# Patient Record
Sex: Female | Born: 1960 | Race: Black or African American | Hispanic: No | Marital: Single | Smoking: Former smoker
Health system: Southern US, Community
[De-identification: ages and names within clinical notes are randomized; demographics above are authoritative.]

## PROBLEM LIST (undated history)

## (undated) ENCOUNTER — Emergency Department (HOSPITAL_COMMUNITY): Discharge status: Absent without leave | Payer: Medicare Other

## (undated) DIAGNOSIS — I639 Cerebral infarction, unspecified: Secondary | ICD-10-CM

## (undated) DIAGNOSIS — I82409 Acute embolism and thrombosis of unspecified deep veins of unspecified lower extremity: Secondary | ICD-10-CM

## (undated) DIAGNOSIS — N390 Urinary tract infection, site not specified: Secondary | ICD-10-CM

## (undated) DIAGNOSIS — G629 Polyneuropathy, unspecified: Secondary | ICD-10-CM

## (undated) DIAGNOSIS — N319 Neuromuscular dysfunction of bladder, unspecified: Secondary | ICD-10-CM

## (undated) DIAGNOSIS — Z87442 Personal history of urinary calculi: Secondary | ICD-10-CM

## (undated) DIAGNOSIS — D649 Anemia, unspecified: Secondary | ICD-10-CM

## (undated) DIAGNOSIS — E440 Moderate protein-calorie malnutrition: Secondary | ICD-10-CM

## (undated) DIAGNOSIS — M6281 Muscle weakness (generalized): Secondary | ICD-10-CM

## (undated) DIAGNOSIS — I10 Essential (primary) hypertension: Secondary | ICD-10-CM

## (undated) DIAGNOSIS — N200 Calculus of kidney: Secondary | ICD-10-CM

## (undated) DIAGNOSIS — N730 Acute parametritis and pelvic cellulitis: Secondary | ICD-10-CM

## (undated) DIAGNOSIS — E119 Type 2 diabetes mellitus without complications: Secondary | ICD-10-CM

## (undated) DIAGNOSIS — E46 Unspecified protein-calorie malnutrition: Secondary | ICD-10-CM

## (undated) DIAGNOSIS — K219 Gastro-esophageal reflux disease without esophagitis: Secondary | ICD-10-CM

## (undated) DIAGNOSIS — N183 Chronic kidney disease, stage 3 (moderate): Secondary | ICD-10-CM

## (undated) DIAGNOSIS — G35 Multiple sclerosis: Secondary | ICD-10-CM

## (undated) DIAGNOSIS — M24541 Contracture, right hand: Secondary | ICD-10-CM

## (undated) DIAGNOSIS — Z960 Presence of urogenital implants: Secondary | ICD-10-CM

## (undated) DIAGNOSIS — E785 Hyperlipidemia, unspecified: Secondary | ICD-10-CM

## (undated) DIAGNOSIS — E876 Hypokalemia: Secondary | ICD-10-CM

## (undated) DIAGNOSIS — I890 Lymphedema, not elsewhere classified: Secondary | ICD-10-CM

## (undated) DIAGNOSIS — G709 Myoneural disorder, unspecified: Secondary | ICD-10-CM

## (undated) DIAGNOSIS — Z8619 Personal history of other infectious and parasitic diseases: Secondary | ICD-10-CM

## (undated) DIAGNOSIS — L8994 Pressure ulcer of unspecified site, stage 4: Secondary | ICD-10-CM

## (undated) DIAGNOSIS — D72829 Elevated white blood cell count, unspecified: Secondary | ICD-10-CM

## (undated) DIAGNOSIS — N179 Acute kidney failure, unspecified: Secondary | ICD-10-CM

## (undated) DIAGNOSIS — E1140 Type 2 diabetes mellitus with diabetic neuropathy, unspecified: Secondary | ICD-10-CM

## (undated) HISTORY — PX: HERNIA MESH REMOVAL: SHX1745

## (undated) HISTORY — DX: Moderate protein-calorie malnutrition: E44.0

## (undated) HISTORY — DX: Pressure ulcer of unspecified site, stage 4: L89.94

## (undated) HISTORY — PX: OTHER SURGICAL HISTORY: SHX169

## (undated) HISTORY — PX: TUBAL LIGATION: SHX77

## (undated) HISTORY — DX: Acute kidney failure, unspecified: N17.9

## (undated) HISTORY — DX: Multiple sclerosis: G35

## (undated) HISTORY — DX: Chronic kidney disease, stage 3 (moderate): N18.3

## (undated) HISTORY — PX: NEPHROSTOMY TUBE PLACEMENT (ARMC HX): HXRAD1726

---

## 1998-01-08 ENCOUNTER — Ambulatory Visit (HOSPITAL_COMMUNITY): Admission: RE | Admit: 1998-01-08 | Discharge: 1998-01-08 | Payer: Self-pay | Admitting: Unknown Physician Specialty

## 1998-01-27 ENCOUNTER — Emergency Department (HOSPITAL_COMMUNITY): Admission: EM | Admit: 1998-01-27 | Discharge: 1998-01-27 | Payer: Self-pay | Admitting: Emergency Medicine

## 1998-08-04 ENCOUNTER — Emergency Department (HOSPITAL_COMMUNITY): Admission: EM | Admit: 1998-08-04 | Discharge: 1998-08-04 | Payer: Self-pay | Admitting: Emergency Medicine

## 1999-04-09 ENCOUNTER — Encounter: Payer: Self-pay | Admitting: Emergency Medicine

## 1999-04-09 ENCOUNTER — Emergency Department (HOSPITAL_COMMUNITY): Admission: EM | Admit: 1999-04-09 | Discharge: 1999-04-09 | Payer: Self-pay | Admitting: Emergency Medicine

## 1999-04-26 ENCOUNTER — Emergency Department (HOSPITAL_COMMUNITY): Admission: EM | Admit: 1999-04-26 | Discharge: 1999-04-26 | Payer: Self-pay | Admitting: Emergency Medicine

## 1999-04-26 ENCOUNTER — Encounter: Payer: Self-pay | Admitting: Emergency Medicine

## 1999-06-11 ENCOUNTER — Encounter: Admission: RE | Admit: 1999-06-11 | Discharge: 1999-06-11 | Payer: Self-pay | Admitting: Internal Medicine

## 1999-07-01 ENCOUNTER — Encounter: Admission: RE | Admit: 1999-07-01 | Discharge: 1999-07-01 | Payer: Self-pay | Admitting: Hematology and Oncology

## 1999-07-14 ENCOUNTER — Ambulatory Visit (HOSPITAL_COMMUNITY): Admission: RE | Admit: 1999-07-14 | Discharge: 1999-07-14 | Payer: Self-pay

## 1999-10-10 ENCOUNTER — Ambulatory Visit (HOSPITAL_COMMUNITY): Admission: RE | Admit: 1999-10-10 | Discharge: 1999-10-10 | Payer: Self-pay | Admitting: Psychiatry

## 2000-10-27 ENCOUNTER — Ambulatory Visit (HOSPITAL_COMMUNITY): Admission: RE | Admit: 2000-10-27 | Discharge: 2000-10-27 | Payer: Self-pay | Admitting: *Deleted

## 2000-10-27 ENCOUNTER — Encounter: Payer: Self-pay | Admitting: *Deleted

## 2000-11-09 ENCOUNTER — Observation Stay (HOSPITAL_COMMUNITY): Admission: RE | Admit: 2000-11-09 | Discharge: 2000-11-10 | Payer: Self-pay | Admitting: *Deleted

## 2000-11-09 ENCOUNTER — Encounter (INDEPENDENT_AMBULATORY_CARE_PROVIDER_SITE_OTHER): Payer: Self-pay

## 2001-02-14 ENCOUNTER — Encounter: Admission: RE | Admit: 2001-02-14 | Discharge: 2001-05-15 | Payer: Self-pay | Admitting: *Deleted

## 2001-04-02 ENCOUNTER — Emergency Department (HOSPITAL_COMMUNITY): Admission: EM | Admit: 2001-04-02 | Discharge: 2001-04-02 | Payer: Self-pay | Admitting: Emergency Medicine

## 2001-04-05 ENCOUNTER — Ambulatory Visit (HOSPITAL_COMMUNITY): Admission: RE | Admit: 2001-04-05 | Discharge: 2001-04-05 | Payer: Self-pay | Admitting: Internal Medicine

## 2001-04-05 ENCOUNTER — Encounter: Admission: RE | Admit: 2001-04-05 | Discharge: 2001-04-05 | Payer: Self-pay

## 2001-04-05 ENCOUNTER — Encounter: Payer: Self-pay | Admitting: Internal Medicine

## 2001-05-10 ENCOUNTER — Emergency Department (HOSPITAL_COMMUNITY): Admission: EM | Admit: 2001-05-10 | Discharge: 2001-05-10 | Payer: Self-pay

## 2001-05-18 ENCOUNTER — Emergency Department (HOSPITAL_COMMUNITY): Admission: EM | Admit: 2001-05-18 | Discharge: 2001-05-18 | Payer: Self-pay | Admitting: Emergency Medicine

## 2001-06-20 ENCOUNTER — Encounter: Admission: RE | Admit: 2001-06-20 | Discharge: 2001-06-20 | Payer: Self-pay

## 2001-07-18 ENCOUNTER — Encounter: Payer: Self-pay | Admitting: General Surgery

## 2001-07-19 ENCOUNTER — Encounter (INDEPENDENT_AMBULATORY_CARE_PROVIDER_SITE_OTHER): Payer: Self-pay | Admitting: *Deleted

## 2001-07-19 ENCOUNTER — Inpatient Hospital Stay (HOSPITAL_COMMUNITY): Admission: RE | Admit: 2001-07-19 | Discharge: 2001-07-22 | Payer: Self-pay | Admitting: General Surgery

## 2001-10-09 ENCOUNTER — Ambulatory Visit (HOSPITAL_COMMUNITY): Admission: RE | Admit: 2001-10-09 | Discharge: 2001-10-09 | Payer: Self-pay

## 2003-07-07 ENCOUNTER — Encounter: Admission: RE | Admit: 2003-07-07 | Discharge: 2003-10-05 | Payer: Self-pay | Admitting: Internal Medicine

## 2004-04-09 ENCOUNTER — Other Ambulatory Visit: Admission: RE | Admit: 2004-04-09 | Discharge: 2004-04-09 | Payer: Self-pay | Admitting: Internal Medicine

## 2005-02-15 ENCOUNTER — Other Ambulatory Visit: Admission: RE | Admit: 2005-02-15 | Discharge: 2005-02-15 | Payer: Self-pay | Admitting: Internal Medicine

## 2006-05-23 ENCOUNTER — Encounter: Admission: RE | Admit: 2006-05-23 | Discharge: 2006-05-23 | Payer: Self-pay | Admitting: Internal Medicine

## 2006-12-06 ENCOUNTER — Other Ambulatory Visit: Admission: RE | Admit: 2006-12-06 | Discharge: 2006-12-06 | Payer: Self-pay | Admitting: Obstetrics and Gynecology

## 2007-12-04 ENCOUNTER — Encounter: Admission: RE | Admit: 2007-12-04 | Discharge: 2007-12-05 | Payer: Self-pay | Admitting: Internal Medicine

## 2010-01-06 ENCOUNTER — Encounter: Admission: RE | Admit: 2010-01-06 | Discharge: 2010-01-06 | Payer: Self-pay | Admitting: Internal Medicine

## 2010-01-18 ENCOUNTER — Other Ambulatory Visit: Payer: Self-pay | Admitting: Emergency Medicine

## 2010-01-18 ENCOUNTER — Ambulatory Visit: Payer: Self-pay | Admitting: Critical Care Medicine

## 2010-01-18 ENCOUNTER — Inpatient Hospital Stay (HOSPITAL_COMMUNITY): Admission: EM | Admit: 2010-01-18 | Discharge: 2010-01-26 | Payer: Self-pay | Admitting: Emergency Medicine

## 2010-01-19 ENCOUNTER — Other Ambulatory Visit: Payer: Self-pay | Admitting: Emergency Medicine

## 2010-01-22 ENCOUNTER — Other Ambulatory Visit: Payer: Self-pay | Admitting: Emergency Medicine

## 2010-01-25 ENCOUNTER — Other Ambulatory Visit: Payer: Self-pay | Admitting: Emergency Medicine

## 2010-07-04 ENCOUNTER — Encounter: Payer: Self-pay | Admitting: Internal Medicine

## 2010-07-05 ENCOUNTER — Encounter: Payer: Self-pay | Admitting: Internal Medicine

## 2010-08-26 LAB — GLUCOSE, CAPILLARY
Glucose-Capillary: 124 mg/dL — ABNORMAL HIGH (ref 70–99)
Glucose-Capillary: 124 mg/dL — ABNORMAL HIGH (ref 70–99)

## 2010-08-27 LAB — PROCALCITONIN
Procalcitonin: 36.22 ng/mL
Procalcitonin: 61.16 ng/mL
Procalcitonin: 95.34 ng/mL

## 2010-08-27 LAB — BASIC METABOLIC PANEL
BUN: 12 mg/dL (ref 6–23)
BUN: 13 mg/dL (ref 6–23)
BUN: 19 mg/dL (ref 6–23)
Calcium: 7 mg/dL — ABNORMAL LOW (ref 8.4–10.5)
Calcium: 8.5 mg/dL (ref 8.4–10.5)
Calcium: 8.9 mg/dL (ref 8.4–10.5)
Chloride: 114 mEq/L — ABNORMAL HIGH (ref 96–112)
Chloride: 117 mEq/L — ABNORMAL HIGH (ref 96–112)
Creatinine, Ser: 0.66 mg/dL (ref 0.4–1.2)
Creatinine, Ser: 1.08 mg/dL (ref 0.4–1.2)
Creatinine, Ser: 1.65 mg/dL — ABNORMAL HIGH (ref 0.4–1.2)
GFR calc Af Amer: 40 mL/min — ABNORMAL LOW (ref >60)
GFR calc non Af Amer: 53 mL/min — ABNORMAL LOW (ref >60)
GFR calc non Af Amer: 58 mL/min — ABNORMAL LOW (ref >60)
GFR calc non Af Amer: >60 mL/min (ref >60)
Potassium: 3.7 mEq/L (ref 3.5–5.1)
Potassium: 4.1 mEq/L (ref 3.5–5.1)

## 2010-08-27 LAB — CULTURE, BLOOD (ROUTINE X 2)

## 2010-08-27 LAB — CARDIAC PANEL(CRET KIN+CKTOT+MB+TROPI)
CK, MB: 6.2 ng/mL (ref 0.3–4.0)
Relative Index: 0.4 (ref 0.0–2.5)
Total CK: 1529 U/L — ABNORMAL HIGH (ref 7–177)
Troponin I: 0.11 ng/mL — ABNORMAL HIGH (ref 0.00–0.06)

## 2010-08-27 LAB — CBC
HCT: 34.2 % — ABNORMAL LOW (ref 36.0–46.0)
Hemoglobin: 13.5 g/dL (ref 12.0–15.0)
MCH: 31.4 pg (ref 26.0–34.0)
MCHC: 34.6 g/dL (ref 30.0–36.0)
MCHC: 35 g/dL (ref 30.0–36.0)
MCV: 88.5 fL (ref 78.0–100.0)
MCV: 89.8 fL (ref 78.0–100.0)
Platelets: 102 10*3/uL — ABNORMAL LOW (ref 150–400)
Platelets: 106 10*3/uL — ABNORMAL LOW (ref 150–400)
Platelets: 131 10*3/uL — ABNORMAL LOW (ref 150–400)
Platelets: 137 10*3/uL — ABNORMAL LOW (ref 150–400)
Platelets: 212 10*3/uL (ref 150–400)
RBC: 3.41 MIL/uL — ABNORMAL LOW (ref 3.87–5.11)
RDW: 15.4 % (ref 11.5–15.5)
RDW: 15.7 % — ABNORMAL HIGH (ref 11.5–15.5)
RDW: 15.8 % — ABNORMAL HIGH (ref 11.5–15.5)
RDW: 15.9 % — ABNORMAL HIGH (ref 11.5–15.5)
WBC: 10.9 10*3/uL — ABNORMAL HIGH (ref 4.0–10.5)
WBC: 13 10*3/uL — ABNORMAL HIGH (ref 4.0–10.5)
WBC: 14.5 10*3/uL — ABNORMAL HIGH (ref 4.0–10.5)
WBC: 22.3 10*3/uL — ABNORMAL HIGH (ref 4.0–10.5)
WBC: 29.5 10*3/uL — ABNORMAL HIGH (ref 4.0–10.5)

## 2010-08-27 LAB — TYPE AND SCREEN: Antibody Screen: NEGATIVE

## 2010-08-27 LAB — BLOOD GAS, ARTERIAL
Patient temperature: 97.9
pH, Arterial: 7.451 — ABNORMAL HIGH (ref 7.350–7.400)

## 2010-08-27 LAB — DIFFERENTIAL
Lymphocytes Relative: 2 % — ABNORMAL LOW (ref 12–46)
Lymphs Abs: 0.6 10*3/uL — ABNORMAL LOW (ref 0.7–4.0)
Neutro Abs: 27.7 10*3/uL — ABNORMAL HIGH (ref 1.7–7.7)

## 2010-08-27 LAB — COMPREHENSIVE METABOLIC PANEL
Albumin: 3.3 g/dL — ABNORMAL LOW (ref 3.5–5.2)
Alkaline Phosphatase: 74 U/L (ref 39–117)
Calcium: 8.8 mg/dL (ref 8.4–10.5)
Chloride: 105 mEq/L (ref 96–112)
Creatinine, Ser: 1.89 mg/dL — ABNORMAL HIGH (ref 0.4–1.2)
GFR calc Af Amer: 34 mL/min — ABNORMAL LOW (ref >60)
Potassium: 3.3 mEq/L — ABNORMAL LOW (ref 3.5–5.1)
Sodium: 137 mEq/L (ref 135–145)

## 2010-08-27 LAB — URINE CULTURE: Colony Count: >=100000

## 2010-08-27 LAB — POCT I-STAT 3, ART BLOOD GAS (G3+)
Acid-base deficit: 9 mmol/L — ABNORMAL HIGH (ref 0.0–2.0)
O2 Saturation: 96 %
Patient temperature: 98.6
pCO2 arterial: 24 mmHg — ABNORMAL LOW (ref 35.0–45.0)
pO2, Arterial: 80 mmHg (ref 80.0–100.0)

## 2010-08-27 LAB — CARBOXYHEMOGLOBIN
Carboxyhemoglobin: 1.2 % (ref 0.5–1.5)
Methemoglobin: 1.2 % (ref 0.0–1.5)
O2 Saturation: 74.6 %

## 2010-08-27 LAB — GLUCOSE, CAPILLARY
Glucose-Capillary: 110 mg/dL — ABNORMAL HIGH (ref 70–99)
Glucose-Capillary: 134 mg/dL — ABNORMAL HIGH (ref 70–99)
Glucose-Capillary: 134 mg/dL — ABNORMAL HIGH (ref 70–99)
Glucose-Capillary: 151 mg/dL — ABNORMAL HIGH (ref 70–99)

## 2010-08-27 LAB — MRSA PCR SCREENING: MRSA by PCR: NEGATIVE

## 2010-08-27 LAB — PROTIME-INR
INR: 1.48 (ref 0.00–1.49)
Prothrombin Time: 18.1 seconds — ABNORMAL HIGH (ref 11.6–15.2)

## 2010-08-27 LAB — URINE MICROSCOPIC-ADD ON

## 2010-08-27 LAB — URINALYSIS, ROUTINE W REFLEX MICROSCOPIC: pH: 8 (ref 5.0–8.0)

## 2010-08-27 LAB — ABO/RH: ABO/RH(D): A POS

## 2010-08-27 LAB — D-DIMER, QUANTITATIVE: D-Dimer, Quant: 16.74 ug/mL-FEU — ABNORMAL HIGH (ref 0.00–0.48)

## 2010-08-27 LAB — POCT I-STAT, CHEM 8
BUN: 27 mg/dL — ABNORMAL HIGH (ref 6–23)
Calcium, Ion: 1.07 mmol/L — ABNORMAL LOW (ref 1.12–1.32)
HCT: 38 % (ref 36.0–46.0)

## 2010-08-27 LAB — LACTIC ACID, PLASMA: Lactic Acid, Venous: 2.7 mmol/L — ABNORMAL HIGH (ref 0.5–2.2)

## 2010-10-29 NOTE — H&P (Signed)
[place]. [facility]  Patient:    [name], [name] Visit Number: [id number] MRN: [id number]          Service Type: SUR Location: 5700 [id number] Attending Physician:  Henrene Dodge Dictated by:   Anselm Pancoast. Zachery Dakins, M.D. Admit Date:  07/19/2001                           History and Physical  CHIEF COMPLAINT:  Incisional hernia.  HISTORY OF PRESENT ILLNESS:  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  She was disabled medically in January of 2002 because of multiple sclerosis.  She is followed by [name], M.D. She had a laparoscopic tubal ligation with a large fibroid identified, which was removed with a transverse lower abdominal incisional by [name], M.D.  This was probably kind of a Pfannenstiel incision, except it was a little higher than a typical Pfannenstiel.  About a month later, she started having a small bulge, which has significantly given way, so it is now kind of a cantaloupe size.  MEDICATIONS:  She is on multiple medications for the MS: 1. Beterson injection every other day. 2. Bacalofin t.i.d. 3. Cycleabamize t.i.d. 4. Flexeril.  REVIEW OF SYSTEMS:  Cardiac:  She denies symptoms.  Pulmonary:  She says that she has kind of a dry, hacking cough, but her chest x-ray was clear. Neurologic:  She has extremity weakness and numbness thought due to multiple sclerosis.  Hematology:  Denies problems.  GI:  She states that she has had a problem with sort of acid reflux.  GU:  Denies problems.  Musculoskeletal: She says that she more weak on the right side than the left.  Denies ears, nose, and throat problems.  SOCIAL HISTORY:  The patient is not married.  She lists her cousin as her contact person.  PHYSICAL EXAMINATION:  She is a healthy-appearing, slightly overweight, black female in no acute  distress.  VITAL SIGNS:  Temperature 98.3 degrees, pulse 88, respirations 18, blood pressure 118/76.  HEIGHT:  She is 5 feet 6 inches.  WEIGHT:  She weighs 213 pounds.  HEENT:  Normocephalic.  Pupils equal and reactive.  She appears adequately hydrated.  LYMPHATICS:  No cervical or supraclavicular lymphadenopathy.  LUNGS:  Clear.  BREASTS:  Negative.  CARDIAC:  Normal sinus rhythm.  ABDOMEN:  With straining there is a cantaloupe or orange sized mass in the lower abdomen about halfway between the umbilicus and pubic area.  It is easily reducible, but she appears to have a fascia defect of about 2-3 inches in size.  RECTAL:  Stools were Hemoccult negative in the office.  EXTREMITIES:  She states that she is weaker on the right than the left, but she walks.  SKIN:  Unremarkable.  ADMISSION IMPRESSION: 1. Incisional hernia following a partial hysterectomy. 2. History of multiple sclerosis.  PLAN:  The patient will have repair of this incisional hernia with general anesthesia.  We will plan on keeping her at least overnight and possible a couple of days according to much she progresses postoperatively.  She has undergo a mechanical bowel prep to minimize the constipation postoperatively. Dictated by:   Anselm Pancoast. Zachery Dakins, M.D. Attending Physician:  Henrene Dodge DD:  07/19/01 TD:  07/19/01 Job: [id number]

## 2010-10-29 NOTE — Op Note (Signed)
[facility]  Patient:    [name], [name]                       MRN: [id number] Proc. Date: 11/09/00 Adm. Date:  [id number] Attending:  Deniece Ree                           Operative Report  PREOPERATIVE DIAGNOSIS:       Elective sterilization.  POSTOPERATIVE DIAGNOSIS:      Elective sterilization, massive pelvic adhesions, leiomyomata uteri.  OPERATION:                    Laparoscopic attempt at tubal sterilization, which the right tube was accomplished and cauterized; exploratory laparotomy with bilateral salpingectomy.  SURGEON:                      Deniece Ree, M.D.  ANESTHESIA:                   General.  ESTIMATED BLOOD LOSS:         100 cc.  CONDITION:                    The patient tolerated the procedure well and returned to the recovery room in satisfactory condition.  DESCRIPTION OF PROCEDURE:     The patient was taken to the operating room and prepped and draped in the usual fashion for a laparoscopic sterilization procedure.  Speculum was placed in the vagina, following which the anterior lip of the cervix was then grasped with a Christella Hartigan tenaculum.  A subumbilical incision was then made and this was carried out through the fascia and peritoneum, at which time approximately ______ of carbon dioxide was infused without any difficulty.  The laparoscopic trocar was placed into the incision, following which a laparoscope was placed through the sleeve.  Visualization came into view.  It was noted that the uterus had numerous myomas present and massive pelvic adhesions and difficultly in identifying the tubes.  After considerable manipulation, the right tube was identified, grasped, and cauterized approximately 2 cm in length.  In continuing the search for the left tube and due to the massive adhesions, which were sharply dissected away, the inability to identify the tube became apparent.  It was at this time that that portion of  the procedure was to be terminated and to continue on with a minilaparotomy.  The carbon dioxide was then allowed to escape from the abdominal cavity, which we did so without any problems.  The incision was then closed; first with a deep interrupted stitch, followed by a subcuticular stitch using 4-0 Vicryl.  At this point, a Pfannenstiel incision was made, which was carried out into the fascia.  The fascia was ______ incision, which was relatively small.  The rectus muscle was identified and at this point the peritoneal cavity was then entered without any problems.  With some manipulation and further lysing the adhesions, the right tube was again identified, and at this point we buckled up and ligated using 0 plain catgut and the portion of the tube was then labeled and sent to pathology.  The left tube created a far more problem due to the adhesions and for some time difficulty in identifying the tube.  When the tube was finally identified, after removing several adhesions, it was grasped with a Babcock clamp, ligated using 0  plain catgut.  The stump areas were then cauterized, the segment of area that had been removed was labeled and sent to pathology.  Hemostasis remained present, with the exception of the uterus at two locations, which figure-of-eight stitches using 0 chromic was utilized.  At this point, hemostasis was present.  The sponge and needle count was correct x 2.  The abdominal fascia was then closed using #1 Vicryl in a running stitch.  A subcuticular stitch using 4-0 Vicryl was used to close the skin.  The procedure was terminated.  The patient tolerated the procedure well and returned to the recovery room in satisfactory condition.  The patient is to be discharged when fully alert.  She has been instructed for possible complications ______ surgery.  She is scheduled to return to my office in four weeks for followup evaluation or to call me prior to that time should  any problems arise. DD:  11/09/00 TD:  11/09/00 Job: [id number]

## 2010-10-29 NOTE — Discharge Summary (Signed)
[place]. [facility]  Patient:    [name], [name] Visit Number: [id number] MRN: [id number]          Service Type: SUR Location: 5700 5729 [id number] Attending Physician:  Henrene Dodge Dictated by:   Anselm Pancoast. Zachery Dakins, M.D. Admit Date:  07/19/2001 Discharge Date: 07/22/2001                             Discharge Summary  DISCHARGE DIAGNOSES: 1. Large incisional hernia. 2. History of multiple sclerosis.  OPERATION:  Repair of incisional hernia with mesh.  ANESTHESIA:  General.  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. [employer].  She had developed progressive neurologic weakness, diagnosed as multiple sclerosis, and was unable to continue working after January 2002; she is followed by Candy Sledge, M.D.  She had a large fibroid that Deniece Ree, M.D. removed approximately eight or nine months ago, starting off as a laparoscopic surgery and then converted to an open procedure through sort of a transverse Pfannenstiel-type incision.  Approximately one month after her surgery she noticed that she had a small bulge, and then over the past several months this has gradually increased to now it is a grapefruit-sized mass that bulges out with any type of straining.  The patient, because of her multiple sclerosis, has a definite weakness in the left lower extremity predominately, but she is ambulatory and cares for herself.  She is on chronic medications for the MS (Flexeril, ______ ______ t.i.d. and another injection that she does every other day).  The patient was admitted for the selective surgery, and preoperatively her laboratory studies showed a white count of 4,600, hematocrit 36.0.  Her electrolytes were normal, as were her liver function studies.  HOSPITAL COURSE:  She was taken to surgery and under general anesthesia I opened up the old  transverse Pfannenstiel-type incision.  I resected out this very large, kind of chronically thickened hernia sac.  Closed the peritoneum in a vertical manner, as the original incision had been closed.  I then placed a large piece of Prolene mesh under the anterior fascia, sort of within the abdominal wall.  The fascia was then closed over the mesh and then the subcutaneous tissue was also closed.  Postoperatively she had a Foley catheter for the first night.  The Foley catheter was removed and she was able to tolerate a diet.  Because of her neurological weakness and also that she lives alone, I elected to keep her an additional 24 hr.  I also obtained a walker so she could actually assist with ambulation so that she will definitely be stable and not fall.  DISCHARGE DISPOSITION:  Her incision appears to be healing satisfactorily at the time of discharge.  She was discharged on her chronic medications plus Tylox for incisional pain.  If she needs, she can take Milk of Magnesia, as she does have kind of a chronic problem with her stools as far as being sort of constipated.  She was advised to avoid this definitely in the immediate postoperative course.  Her incision was stapled, and the staples will be removed when she is followed up in the office in approximately one week.  She was discharged with an abdominal binder, which she will wear continuously for about the next 3-4 weeks.  She was instructed, of course, that she is  not able to drive as long as she is taking any type of narcotics for pain medication. Dictated by:   Anselm Pancoast. Zachery Dakins, M.D. Attending Physician:  Henrene Dodge DD:  08/23/01 TD:  08/25/01 Job: [id number]

## 2010-10-29 NOTE — Op Note (Signed)
[place]. [facility]  Patient:    [name], [name] Visit Number: [id number] MRN: [id number]          Service Type: SUR Location: 5700 5729 02 Attending Physician:  Henrene Dodge Dictated by:   Anselm Pancoast. Zachery Dakins, M.D. Proc. Date: 07/19/01 Admit Date:  07/19/2001                             Operative Report  PREOPERATIVE DIAGNOSIS:  Incisional hernia.  PROCEDURE:  Repair of incisional hernia with mesh reinforcement.  ANESTHESIA:  General anesthesia.  INDICATIONS:  [name] is a 50 year old slightly overweight female who was referred to me for management of a fairly large incisional hernia.  Dr. Wiliam Ke did a laparoscopic tubal and then an open removal of a fibroid, _____ ________________________ _________________ disability and has developed also a fairly large incisional hernia in the low midline in the low abdominal incision.  She has a small laparoscopic at the naval transverse incision that looks like it is probably sort of a transverse incision, not quite as low as the typical Pfannenstiel, and in this is a definite grapefruit-orange sized bulge that _____________________.  I recommended repair of this under general anesthesia, and she was an a.m. admission for this procedure.  She has undergone a mechanical bowel prep pre-op and had PAS stockings.  She was given a gram of Kefzol.  DESCRIPTION OF PROCEDURE:  The patient was taken to the operative suite, induction of general anesthesia.  A Foley catheter was inserted sterilely, and then the abdomen was prepped with Betadine surgical scrub and solution and draped in a sterile manner.  I elected to remove the old transverse incision. There were a lot of hypertrophied, stretched areas, and the underlying hernia sac was separated from the surrounding subcutaneous tissue circumferentially. It appears that this is kind of a mushroom-type thing, a fairly large hernia sac, but the neck  itself is probably about three inches in size.  After I had worked circumferentially to kind of free up this thickened hernia sac, I then opened into the hernia sac and then basically there was a large loop of sigmoid colon and also some small intestines that were adherent to the hernia sac also along with an incarcerated area of the omentum.  All of this was freed circumferentially.  There was an extensive dissection.  There were not significant adhesions with the exception of the actual hernia sac, and after everything was freed up circumferentially and good hemostasis, I then closed the actual peritoneum with continuous suture of 2-0 Vicryl.  Next, the rectus muscle, which appeared that the rectus muscle had not really been divided but had been sort of stretched widely, and I closed this with the Vicryl sutures also.  I then separated the fascia from the rectus muscle circumferentially so a piece of mesh about 3 x 5 inches could be placed under the mesh circumferentially, and then the mesh was closed over it in sort of a transverse manner.  The mesh was anchored to the pectoral fascia with interrupted sutures of 0 Prolene in all corners and mid-areas also, and then the fascia was sutured over this in a transverse manner, incorporating the fascia with the mesh with the interrupted Prolene sutures.  Next the subcutaneous tissue was closed with 3-0 Vicryl, no drain was placed, and then the skin was closed with staples.  The patient tolerated the procedure nicely and was extubated and  will go to the recovery room in a stable postop condition.  I will keep the Foley this evening and remove it first thing in the morning and then also hopefully start her on a diet at that time, and I hope that she will be ready to go home on Saturday morning.  We will use an abdominal binder throughout the next two to three weeks.  The sponge and needle counts were correct x2, and estimated blood loss is  probably 50-75 cc. Dictated by:   Anselm Pancoast. Zachery Dakins, M.D. Attending Physician:  Henrene Dodge DD:  07/19/01 TD:  07/20/01 Job: [id number]

## 2011-03-01 ENCOUNTER — Inpatient Hospital Stay (HOSPITAL_COMMUNITY)
Admission: EM | Admit: 2011-03-01 | Discharge: 2011-03-04 | DRG: 300 | Disposition: A | Discharge status: Home / Self Care | Payer: PRIVATE HEALTH INSURANCE | Attending: Internal Medicine | Admitting: Internal Medicine

## 2011-03-01 ENCOUNTER — Emergency Department (HOSPITAL_COMMUNITY): Payer: PRIVATE HEALTH INSURANCE

## 2011-03-01 DIAGNOSIS — I10 Essential (primary) hypertension: Secondary | ICD-10-CM | POA: Diagnosis present

## 2011-03-01 DIAGNOSIS — I872 Venous insufficiency (chronic) (peripheral): Principal | ICD-10-CM | POA: Diagnosis present

## 2011-03-01 DIAGNOSIS — R609 Edema, unspecified: Secondary | ICD-10-CM | POA: Diagnosis present

## 2011-03-01 DIAGNOSIS — N390 Urinary tract infection, site not specified: Secondary | ICD-10-CM | POA: Diagnosis present

## 2011-03-01 DIAGNOSIS — B964 Proteus (mirabilis) (morganii) as the cause of diseases classified elsewhere: Secondary | ICD-10-CM | POA: Diagnosis present

## 2011-03-01 DIAGNOSIS — F17200 Nicotine dependence, unspecified, uncomplicated: Secondary | ICD-10-CM | POA: Diagnosis present

## 2011-03-01 DIAGNOSIS — G35 Multiple sclerosis: Secondary | ICD-10-CM | POA: Diagnosis present

## 2011-03-01 DIAGNOSIS — E119 Type 2 diabetes mellitus without complications: Secondary | ICD-10-CM | POA: Diagnosis present

## 2011-03-01 DIAGNOSIS — E876 Hypokalemia: Secondary | ICD-10-CM | POA: Diagnosis not present

## 2011-03-01 LAB — DIFFERENTIAL
Blasts: 0 %
Eosinophils Absolute: 0.4 10*3/uL (ref 0.0–0.7)
Eosinophils Relative: 4 % (ref 0–5)
Monocytes Absolute: 0.6 10*3/uL (ref 0.1–1.0)
Monocytes Relative: 5 % (ref 3–12)
Myelocytes: 0 %
Neutro Abs: 3.7 10*3/uL (ref 1.7–7.7)
Neutrophils Relative %: 34 % — ABNORMAL LOW (ref 43–77)
Smear Review: ADEQUATE
nRBC: 0 /100 WBC

## 2011-03-01 LAB — CBC
HCT: 38.2 % (ref 36.0–46.0)
Hemoglobin: 12.9 g/dL (ref 12.0–15.0)
MCV: 91.6 fL (ref 78.0–100.0)
RDW: 15.3 % (ref 11.5–15.5)
WBC: 11 10*3/uL — ABNORMAL HIGH (ref 4.0–10.5)

## 2011-03-01 LAB — BASIC METABOLIC PANEL
Chloride: 102 mEq/L (ref 96–112)
GFR calc Af Amer: >60 mL/min (ref >60)
GFR calc non Af Amer: >60 mL/min (ref >60)
Potassium: 3.9 mEq/L (ref 3.5–5.1)

## 2011-03-02 DIAGNOSIS — M7989 Other specified soft tissue disorders: Secondary | ICD-10-CM

## 2011-03-02 LAB — URINALYSIS, ROUTINE W REFLEX MICROSCOPIC
Glucose, UA: NEGATIVE mg/dL
Hgb urine dipstick: NEGATIVE
Protein, ur: NEGATIVE mg/dL
pH: 5.5 (ref 5.0–8.0)

## 2011-03-02 LAB — CBC
MCV: 91.3 fL (ref 78.0–100.0)
RBC: 3.8 MIL/uL — ABNORMAL LOW (ref 3.87–5.11)
WBC: 10.1 10*3/uL (ref 4.0–10.5)

## 2011-03-02 LAB — URINE MICROSCOPIC-ADD ON

## 2011-03-02 LAB — DIFFERENTIAL
Basophils Absolute: 0.1 10*3/uL (ref 0.0–0.1)
Eosinophils Absolute: 0.2 10*3/uL (ref 0.0–0.7)
Lymphs Abs: 4.6 10*3/uL — ABNORMAL HIGH (ref 0.7–4.0)
Monocytes Absolute: 0.9 10*3/uL (ref 0.1–1.0)
Neutrophils Relative %: 43 % (ref 43–77)

## 2011-03-02 LAB — BASIC METABOLIC PANEL
BUN: 13 mg/dL (ref 6–23)
Calcium: 10 mg/dL (ref 8.4–10.5)
GFR calc non Af Amer: >60 mL/min (ref >60)
Glucose, Bld: 181 mg/dL — ABNORMAL HIGH (ref 70–99)

## 2011-03-03 LAB — BASIC METABOLIC PANEL
BUN: 14 mg/dL (ref 6–23)
GFR calc non Af Amer: >60 mL/min (ref >60)
Glucose, Bld: 186 mg/dL — ABNORMAL HIGH (ref 70–99)
Potassium: 3.4 mEq/L — ABNORMAL LOW (ref 3.5–5.1)

## 2011-03-04 LAB — BASIC METABOLIC PANEL
CO2: 25 mEq/L (ref 19–32)
Calcium: 10.7 mg/dL — ABNORMAL HIGH (ref 8.4–10.5)
Chloride: 103 mEq/L (ref 96–112)
Glucose, Bld: 163 mg/dL — ABNORMAL HIGH (ref 70–99)
Sodium: 138 mEq/L (ref 135–145)

## 2011-03-14 NOTE — Discharge Summary (Signed)
[name], [name]                ACCOUNT NO.:  [id number]  MEDICAL RECORD NO.:  [id number]  LOCATION:  1305                         FACILITY:  [facility]  PHYSICIAN:  Georgann Housekeeper, MD      DATE OF BIRTH:  Oct 30, 1960  DATE OF ADMISSION:  03/01/2011 DATE OF DISCHARGE:  03/04/2011                              DISCHARGE SUMMARY   DISCHARGE DIAGNOSES: 1. Bilateral lower leg edema, worsening with chronic history of leg     edema and venous insufficiency. 2. Ruled out for deep vein thrombosis, negative Doppler studies of the     leg. 3. Urinary tract infection, culture with Proteus, positive for Cipro. 4. History of multiple sclerosis with limited ambulation, mostly on     the power scooter. 5. History of type 2 diabetes, diet-controlled. 6. History of mild anemia. 7. History of borderline hypertension. 8. Hypokalemia secondary to diuretics, resolved.  MEDICATION ON DISCHARGE: 1. Cipro 250 mg p.o. b.i.d. for 7 days. 2. Lasix 40 mg p.o. b.i.d. 3. Potassium 20 mEq p.o. b.i.d. 4. Baclofen 10 mg 1 t.i.d. 5. Betaseron injections 0.3 every other day subcu. 6. Flexeril 10 mg t.i.d. p.r.n. 7. Iron 325 one tablet daily. 8. Multivitamin 1 tablet daily. 9. VESIcare 5 mg daily.  LABORATORY DATA:  Chemistries:  Potassium 3.8, creatinine 0.63, sodium 138, glucose 163, hemoglobin 11.7, white count of 10.1.  Cultures: Urine cultures showed Proteus sensitive to Cipro.  Blood cultures were negative.  Chest x-ray, negative.  Doppler of the lower legs, negative for DVT.  A1c was 6.8.  TSH was 2.0.  BNP was 15.  Urinalysis was positive.  PHYSICAL EXAMINATION:  VITAL SIGNS:  Blood pressure 121/77, pulse 80-90 range, sats of 98% on room air, temperature 98.  HOSPITAL COURSE:  A 50 year old female with history of MS, on Betaseron and history of chronic leg edema, hypertension, borderline diabetes, worsening leg edema, unable to put the shoes on and increasing discomfort, came into the emergency  room. 1. Bilateral leg edemas worsening over baseline.  She was taking Lasix     1-1/2 tablet at home, but mostly, she is in the power scooter all     day, worsening with venous insufficiency underlying.  The patient     was started on IV Lasix, diuresed well with improving of leg edema     significantly.  She also had a compression stocking placed in.     Doppler studies were negative for DVT.  She had a negative BNP.  No     evidence of any CHF.  Chest x-ray was negative.  No shortness of     breath or any cardiac symptoms.  The patient will switch to p.o.     Lasix 40 mg b.i.d. and with potassium replacement increased to     cover the hypokalemia.  She will continue to wear the compression     hose stockings and increase ambulation with physical therapy. 2. UTI.  The patient had been started on Cipro p.o.  She will continue     that for 7 days.  Culture was positive and Cipro sensitive. 3. MS.  Continue on Betaseron, baclofen and cyclobenzaprine.  She has  limited ambulation.  We will get the physical therapy to continue     at home to improve her ambulation status. 4. Type 2 diabetes.  A1c was 6.8.  She has been not watching her diet,     was borderline in the past.  Continue aggressive diet.  Glucose     teaching was done in the hospital as well as glucose monitor     provided, will follow up patient for her sugars.  No medication at     this point, as far as mild hypokalemia resolved. 5. History of hypertension continue on diuretics.  Blood pressure has     been stable.  I will see her back in 2 weeks.  The patient will     have a home health and physical therapy upon discharge.     Georgann Housekeeper, MD     KH/MEDQ  D:  03/04/2011  T:  03/04/2011  Job:  [id number]  Electronically Signed by Georgann Housekeeper MD on 03/14/2011 09:58:05 PM

## 2011-03-27 NOTE — H&P (Signed)
[name], [name]                ACCOUNT NO.:  [id number]  MEDICAL RECORD NO.:  [id number]  LOCATION:  WLED                         FACILITY:  [facility]  PHYSICIAN:  Houston Siren, MD           DATE OF BIRTH:  30-May-1961  DATE OF ADMISSION:  03/01/2011 DATE OF DISCHARGE:                             HISTORY & PHYSICAL   PRIMARY CARE PHYSICIAN:  Georgann Housekeeper, MD  ADVANCE DIRECTIVE:  Full code.  REASON FOR ADMISSION:  Bilateral pedal edema.  HISTORY OF PRESENT ILLNESS:  This is a 50 year old African American female with history of MS, diagnosed in 2001, hypertension, tobacco use, chronic lymphedema of the lower extremity with chronic swelling, history of septic shock, presents to the emergency room from home complaining of increased swelling bilaterally and increase in pain.  She denies any shortness of breath, chest pain, fever, or chills.  The swelling has been chronic and progressive.  She has pain in her legs because of the swelling and did not want to go home with swollen legs. She has been treated with p.o. Lasix at home.  Evaluation in emergency room shows a white count of 11,000, hemoglobin of 12.9 with clumped platelets.  Her creatinine is 0.54 and BNP of 15.  Hospitalist was asked to admit the patient because of swollen legs.  PAST MEDICAL HISTORY: 1. Hypertension. 2. Multiple sclerosis.  SOCIAL HISTORY:  She lives at home.  Denied tobacco or alcohol use.  FAMILY HISTORY:  Noncontributory.  CURRENT MEDICATIONS:  Unclear dosages, baclofen, Vesicare, iron, p.o. Lasix along with her MS that sounds like interferon beta.  REVIEW OF SYSTEMS:  Otherwise unremarkable.  PHYSICAL EXAMINATION:  VITAL SIGNS:  Blood pressure 120/70, pulse of 90, respiratory rate 17, temperature 98.3. GENERAL:  She is alert and oriented and is in no apparent distress. Speech is fluent. HEENT:  Sclerae are nonicteric.  Tongue is midline. NECK:  Supple. CARDIAC EXAM:  Reveals S1 and S2  regular. LUNGS:  Clear.  No wheezes, rales, or any evidence of consolidation. ABDOMINAL EXAM:  Obese, nondistended, nontender. EXTREMITIES:  She has a 3+ pitting edema, diffusely tender everywhere, only slight erythema.  Unable to palpate her pulses because of the swelling, but she has no vascular compromise. SKIN:  Warm and dry.  OBJECTIVE FINDINGS:  As above.  IMPRESSION:  This is a 50 year old female with multiple sclerosis and chronic bilateral lower extremity edema admitted because of the increased swelling, needing to be diuresed for symptomatic relief.  We will give her Lasix at 40 mg IV q.12 h.  I will get bilateral Doppler of her lower extremities to make sure she does not have a deep venous thrombosis, but I did not give her full-dose Lovenox as my clinical suspicion is low.  She also has no evidence of infection and we will hold off on antibiotics.  We will continue all her medications once reconciled.  We will monitor her electrolytes carefully.  She is a full code, would be admitted to [facility] under Dr. Venita Sheffield team.  As perprior arrangement, he or his colleague will resume her care in the morning.     Houston Siren, MD  PL/MEDQ  D:  03/01/2011  T:  03/01/2011  Job:  [id number]  Electronically Signed by Houston Siren  on 03/27/2011 03:10:34 PM

## 2011-06-21 ENCOUNTER — Other Ambulatory Visit: Payer: Self-pay | Admitting: Internal Medicine

## 2011-06-21 DIAGNOSIS — Z1231 Encounter for screening mammogram for malignant neoplasm of breast: Secondary | ICD-10-CM

## 2011-07-07 ENCOUNTER — Ambulatory Visit
Admission: RE | Admit: 2011-07-07 | Discharge: 2011-07-07 | Disposition: A | Discharge status: Home / Self Care | Payer: PRIVATE HEALTH INSURANCE | Source: Ambulatory Visit | Attending: Internal Medicine | Admitting: Internal Medicine

## 2011-07-07 ENCOUNTER — Other Ambulatory Visit: Payer: Self-pay | Admitting: Internal Medicine

## 2011-07-07 DIAGNOSIS — Z1231 Encounter for screening mammogram for malignant neoplasm of breast: Secondary | ICD-10-CM

## 2012-06-25 ENCOUNTER — Inpatient Hospital Stay (HOSPITAL_COMMUNITY)
Admission: EM | Admit: 2012-06-25 | Discharge: 2012-06-30 | DRG: 194 | Disposition: A | Discharge status: Home Health | Payer: PRIVATE HEALTH INSURANCE | Attending: Internal Medicine | Admitting: Internal Medicine

## 2012-06-25 ENCOUNTER — Emergency Department (HOSPITAL_COMMUNITY): Payer: PRIVATE HEALTH INSURANCE

## 2012-06-25 ENCOUNTER — Encounter (HOSPITAL_COMMUNITY): Payer: Self-pay | Admitting: Internal Medicine

## 2012-06-25 DIAGNOSIS — G35 Multiple sclerosis: Secondary | ICD-10-CM | POA: Diagnosis present

## 2012-06-25 DIAGNOSIS — F17200 Nicotine dependence, unspecified, uncomplicated: Secondary | ICD-10-CM | POA: Diagnosis present

## 2012-06-25 DIAGNOSIS — D638 Anemia in other chronic diseases classified elsewhere: Secondary | ICD-10-CM | POA: Diagnosis present

## 2012-06-25 DIAGNOSIS — R609 Edema, unspecified: Secondary | ICD-10-CM | POA: Diagnosis present

## 2012-06-25 DIAGNOSIS — A498 Other bacterial infections of unspecified site: Secondary | ICD-10-CM | POA: Diagnosis present

## 2012-06-25 DIAGNOSIS — N390 Urinary tract infection, site not specified: Secondary | ICD-10-CM

## 2012-06-25 DIAGNOSIS — N319 Neuromuscular dysfunction of bladder, unspecified: Secondary | ICD-10-CM | POA: Diagnosis present

## 2012-06-25 DIAGNOSIS — J189 Pneumonia, unspecified organism: Principal | ICD-10-CM

## 2012-06-25 DIAGNOSIS — E119 Type 2 diabetes mellitus without complications: Secondary | ICD-10-CM

## 2012-06-25 LAB — CBC WITH DIFFERENTIAL/PLATELET
Basophils Absolute: 0.1 10*3/uL (ref 0.0–0.1)
Eosinophils Relative: 0 % (ref 0–5)
HCT: 38.4 % (ref 36.0–46.0)
Hemoglobin: 12.6 g/dL (ref 12.0–15.0)
Lymphocytes Relative: 21 % (ref 12–46)
MCHC: 32.8 g/dL (ref 30.0–36.0)
MCV: 91 fL (ref 78.0–100.0)
Monocytes Absolute: 1 10*3/uL (ref 0.1–1.0)
Monocytes Relative: 14 % — ABNORMAL HIGH (ref 3–12)
RDW: 15.4 % (ref 11.5–15.5)
WBC: 7.1 10*3/uL (ref 4.0–10.5)

## 2012-06-25 LAB — URINALYSIS, MICROSCOPIC ONLY
Bilirubin Urine: NEGATIVE
Glucose, UA: NEGATIVE mg/dL
Ketones, ur: NEGATIVE mg/dL
Protein, ur: NEGATIVE mg/dL
pH: 5.5 (ref 5.0–8.0)

## 2012-06-25 LAB — BASIC METABOLIC PANEL
BUN: 10 mg/dL (ref 6–23)
CO2: 23 mEq/L (ref 19–32)
Calcium: 9.8 mg/dL (ref 8.4–10.5)
Creatinine, Ser: 0.8 mg/dL (ref 0.50–1.10)
Glucose, Bld: 125 mg/dL — ABNORMAL HIGH (ref 70–99)

## 2012-06-25 MED ORDER — ACETAMINOPHEN 325 MG PO TABS
650.0000 mg | ORAL_TABLET | Freq: Once | ORAL | Status: AC
  Administered 2012-06-25: 650 mg via ORAL
  Filled 2012-06-25: qty 2

## 2012-06-25 MED ORDER — HYDROCOD POLST-CHLORPHEN POLST 10-8 MG/5ML PO LQCR
5.0000 mL | Freq: Once | ORAL | Status: AC
  Administered 2012-06-25: 5 mL via ORAL
  Filled 2012-06-25: qty 5

## 2012-06-25 MED ORDER — AZITHROMYCIN 500 MG IV SOLR
500.0000 mg | Freq: Once | INTRAVENOUS | Status: AC
  Administered 2012-06-25: 500 mg via INTRAVENOUS
  Filled 2012-06-25: qty 500

## 2012-06-25 MED ORDER — INSULIN ASPART 100 UNIT/ML ~~LOC~~ SOLN
0.0000 [IU] | Freq: Every day | SUBCUTANEOUS | Status: DC
  Administered 2012-06-29: 3 [IU] via SUBCUTANEOUS

## 2012-06-25 MED ORDER — ENOXAPARIN SODIUM 30 MG/0.3ML ~~LOC~~ SOLN: 30.0000 mg | SUBCUTANEOUS | Status: DC

## 2012-06-25 MED ORDER — INSULIN ASPART 100 UNIT/ML ~~LOC~~ SOLN
0.0000 [IU] | Freq: Three times a day (TID) | SUBCUTANEOUS | Status: DC
  Administered 2012-06-26: 2 [IU] via SUBCUTANEOUS
  Administered 2012-06-26 – 2012-06-28 (×3): 1 [IU] via SUBCUTANEOUS
  Administered 2012-06-28: 2 [IU] via SUBCUTANEOUS
  Administered 2012-06-29: 1 [IU] via SUBCUTANEOUS
  Administered 2012-06-29: 3 [IU] via SUBCUTANEOUS
  Administered 2012-06-29: 1 [IU] via SUBCUTANEOUS
  Administered 2012-06-30: 2 [IU] via SUBCUTANEOUS

## 2012-06-25 MED ORDER — ALBUTEROL SULFATE (5 MG/ML) 0.5% IN NEBU
5.0000 mg | INHALATION_SOLUTION | Freq: Once | RESPIRATORY_TRACT | Status: AC
  Administered 2012-06-25: 5 mg via RESPIRATORY_TRACT
  Filled 2012-06-25: qty 1

## 2012-06-25 MED ORDER — SODIUM CHLORIDE 0.9 % IJ SOLN
3.0000 mL | Freq: Two times a day (BID) | INTRAMUSCULAR | Status: DC
  Administered 2012-06-26 – 2012-06-29 (×4): 3 mL via INTRAVENOUS

## 2012-06-25 MED ORDER — SODIUM CHLORIDE 0.9 % IV BOLUS (SEPSIS)
1000.0000 mL | Freq: Once | INTRAVENOUS | Status: AC
  Administered 2012-06-25: 1000 mL via INTRAVENOUS

## 2012-06-25 MED ORDER — AZITHROMYCIN 500 MG PO TABS
500.0000 mg | ORAL_TABLET | ORAL | Status: DC
Start: 2012-06-25 — End: 2012-06-26
  Filled 2012-06-25: qty 1

## 2012-06-25 MED ORDER — CEFTRIAXONE SODIUM 1 G IJ SOLR
1.0000 g | INTRAMUSCULAR | Status: DC
  Administered 2012-06-26 – 2012-06-28 (×3): 1 g via INTRAVENOUS
  Filled 2012-06-25 (×3): qty 10

## 2012-06-25 MED ORDER — DEXTROSE 5 % IV SOLN
1.0000 g | Freq: Once | INTRAVENOUS | Status: AC
  Administered 2012-06-25: 1 g via INTRAVENOUS
  Filled 2012-06-25: qty 10

## 2012-06-25 MED ORDER — THIAMINE HCL 100 MG/ML IJ SOLN
Freq: Once | INTRAVENOUS | Status: AC
  Administered 2012-06-26: via INTRAVENOUS
  Filled 2012-06-25: qty 1000

## 2012-06-25 MED ORDER — THIAMINE HCL 100 MG/ML IJ SOLN: Freq: Once | INTRAVENOUS | Status: DC

## 2012-06-25 NOTE — ED Provider Notes (Signed)
History     CSN: [id number]  Arrival date & time 06/25/12  1940   First MD Initiated Contact with Patient 06/25/12 2000      Chief Complaint  Patient presents with  . Fatigue    (Consider location/radiation/quality/duration/timing/severity/associated sxs/prior treatment) HPI Comments: [name] is a 52 y.o. female with a history of MS, CHF, peripheral vascular disease, and type 2 diabetes that presents emergency department with the acute onset of nonproductive cough.  Associated symptoms include feeling fatigued, generalized weakness, subjective fevers, decreased appetite and gradually worsening shortness of breath.  Patient denies being on oxygen at home.  Patient reports that yesterday she was at her normal baseline and then today woke up feeling "lousy".  Patient denies any chest pain, hemoptysis, night sweats, change in bowel movements, abdominal pain, lower extremity pain nausea, vomiting, diarrhea.  Note the patient lives at home and is not ambulatory on baseline using a motored wheelchair to get around.  The history is provided by the patient.    No past medical history on file.  No past surgical history on file.  No family history on file.  History  Substance Use Topics  . Smoking status: Not on file  . Smokeless tobacco: Not on file  . Alcohol Use: Not on file    OB History    Grav Para Term Preterm Abortions TAB SAB Ect Mult Living                  Review of Systems  Constitutional: Positive for fever, chills and appetite change.  HENT: Negative for ear pain, congestion, sore throat, trouble swallowing and neck stiffness.   Eyes: Negative for visual disturbance.  Respiratory: Positive for cough and shortness of breath.   Cardiovascular: Positive for leg swelling. Negative for chest pain.  Gastrointestinal: Negative for abdominal pain.  Genitourinary: Negative for dysuria, urgency and frequency.  Skin: Negative for rash.  Neurological: Negative for  dizziness, syncope, weakness, light-headedness, numbness and headaches.  Psychiatric/Behavioral: Negative for confusion.  All other systems reviewed and are negative.    Allergies  Review of patient's allergies indicates no known allergies.  Home Medications   Current Outpatient Rx  Name  Route  Sig  Dispense  Refill  . VITAMIN C PO   Oral   Take 1 tablet by mouth daily.         Marland Kitchen BACLOFEN 10 MG PO TABS   Oral   Take 10 mg by mouth 2 (two) times daily.         Marland Kitchen FERROUS SULFATE PO   Oral   Take 1 tablet by mouth daily.         . FUROSEMIDE 40 MG PO TABS   Oral   Take 40 mg by mouth 2 (two) times daily.         Marland Kitchen GLIPIZIDE ER 2.5 MG PO TB24   Oral   Take 2.5 mg by mouth daily.         Marland Kitchen LISINOPRIL 10 MG PO TABS   Oral   Take 10 mg by mouth daily.         Marland Kitchen LOVASTATIN 20 MG PO TABS   Oral   Take 20 mg by mouth daily.         Marland Kitchen FISH OIL PO   Oral   Take 1 capsule by mouth daily.         Marland Kitchen SOLIFENACIN SUCCINATE 5 MG PO TABS   Oral   Take  5 mg by mouth daily.         Marland Kitchen TIZANIDINE HCL 4 MG PO TABS   Oral   Take 4 mg by mouth 2 (two) times daily.           BP 116/58  Pulse 116  Temp 101.9 F (38.8 C) (Oral)  Resp 16  SpO2 98%  Physical Exam  Nursing note and vitals reviewed. Constitutional: She is oriented to person, place, and time. She appears well-developed and well-nourished. She appears distressed.  HENT:  Head: Normocephalic and atraumatic.       Mucous membranes moist with clear oropharynx  Eyes: Conjunctivae normal and EOM are normal.  Neck: Normal range of motion.       Soft with normal range of motion  Cardiovascular:       3+ pitting edema bilaterally, unable to papillated distal pulses do to swelling.  Tachycardic.  Pulmonary/Chest: Effort normal.       Rhonchi heard in right lower lung.  Wheezing throughout.  Increased respiratory effort with accessory muscle use, but no nasal flaring.  Patient able to speak full  sentences.  Abdominal:       Soft nontender obese abdomen  Genitourinary:       Urinary incontinence  Musculoskeletal: Normal range of motion.  Neurological: She is alert and oriented to person, place, and time.  Skin: Skin is warm and dry. No rash noted. She is not diaphoretic.  Psychiatric: She has a normal mood and affect. Her behavior is normal.    ED Course  Procedures (including critical care time)  Labs Reviewed  CBC WITH DIFFERENTIAL - Abnormal; Notable for the following:    Monocytes Relative 14 (*)     All other components within normal limits  BASIC METABOLIC PANEL - Abnormal; Notable for the following:    Glucose, Bld 125 (*)     GFR calc non Af Amer 83 (*)     All other components within normal limits  URINALYSIS, MICROSCOPIC ONLY - Abnormal; Notable for the following:    APPearance CLOUDY (*)     Hgb urine dipstick TRACE (*)     Nitrite POSITIVE (*)     Leukocytes, UA LARGE (*)     Bacteria, UA MANY (*)     Squamous Epithelial / LPF FEW (*)     All other components within normal limits  URINE CULTURE   Dg Chest 2 View  06/25/2012  *RADIOLOGY REPORT*  Clinical Data: Cough, fatigue.  CHEST - 2 VIEW  Comparison: 03/01/2011  Findings: The right posterior costophrenic angle is partially excluded.  Subsegmental atelectasis versus early infiltrate in the right lower lobe.  No effusion.  Heart size normal.  Regional bones unremarkable.  IMPRESSION:  1.  Right lower lobe subsegmental atelectasis versus early infiltrate.   Original Report Authenticated By: D. Andria Rhein, MD      No diagnosis found.    MDM  [name] is a 52 y.o. female w a hx of T2DM, PVD, CHF & MS that presented to the emergency department with acute onset of cough.  Patient arrived febrile, tachycardic, and continuously coughing.  Labs and imaging reviewed.  Patient found to have urinary tract infection and likely early right lower lobe pneumonia.  Treated in the emergency department with  antitussives, nebulizer, IV fluids, and IV antibiotics (Rocephin and azithromycin). Due to patient's co morbidities,  acute presentation of illness, and inability to care for herself at home it is felt patient needs  to be admitted for observation and continued IV antibiotics.  Case discussed with attending who agrees with my plan to admit patient. The patient appears reasonably stabilized for admission considering the current resources, flow, and capabilities available in the ED at this time, and I doubt any other [facility] requiring further screening and/or treatment in the ED prior to admission.         Jaci Carrel, [place] 06/25/12 2211

## 2012-06-25 NOTE — ED Notes (Signed)
Patient transported to X-ray 

## 2012-06-25 NOTE — ED Notes (Signed)
[id number]<AV> Expected date:06/25/12<BR> Expected time: 7:34 PM<BR> Means of arrival:Ambulance<BR> Comments:<BR> Generalized weakness

## 2012-06-25 NOTE — ED Provider Notes (Signed)
52 year old female with multiple comorbidities presents with onset yesterday of cough, fever, and chest pain. On exam, she does not appear toxic, but she is running a fever of 101.9 and was tachycardic at triage. She also was hypoxic and requires oxygen. Chest x-ray shows what appears to be an early pneumonia. She is started on antibiotics and will be admitted.  Medical screening examination/treatment/procedure(s) were conducted as a shared visit with non-physician practitioner(s) and myself.  I personally evaluated the patient during the encounter   Dione Booze, MD 06/25/12 2158

## 2012-06-25 NOTE — H&P (Addendum)
[name] is an 52 y.o. female.    Pcp: Tyson Dense  Chief Complaint: cough HPI: 52 yo female with MS has c/o cough, worse yesterday, dry.  Denies fever, chills, cp, palp, sob, n/v, diarrhea, constipation, brbpr, black stool presented to the ED for evaluation.   In the ED, CXR noted to show RLL subsegmental atelectasis vs infiltrate, and u/a =>wbc tntc. Pt will be admitted for inpatient admission last 2 or more days due to severe uti, and pneumonia.   Past Medical History  Diagnosis Date  . MS (multiple sclerosis)   . Hyperglycemia     History reviewed. No pertinent past surgical history.  Family History  Problem Relation Age of Onset  . Diabetes Mother   . Diabetes Father   . Diabetes Sister   . Scoliosis Sister    Social History:  reports that she has been smoking Cigarettes.  She has a 20 pack-year smoking history. She does not have any smokeless tobacco history on file. She reports that she drinks about 2.5 ounces of alcohol per week. She reports that she does not use illicit drugs.  Allergies: No Known Allergies   (Not in a hospital admission)  Results for orders placed during the hospital encounter of 06/25/12 (from the past 48 hour(s))  CBC WITH DIFFERENTIAL     Status: Abnormal   Collection Time   06/25/12  8:26 PM      Component Value Range Comment   WBC 7.1  4.0 - 10.5 K/uL    RBC 4.22  3.87 - 5.11 MIL/uL    Hemoglobin 12.6  12.0 - 15.0 g/dL    HCT 29.5  62.1 - 30.8 %    MCV 91.0  78.0 - 100.0 fL    MCH 29.9  26.0 - 34.0 pg    MCHC 32.8  30.0 - 36.0 g/dL    RDW 65.7  84.6 - 96.2 %    Platelets 265  150 - 400 K/uL    Neutrophils Relative 64  43 - 77 %    Neutro Abs 4.6  1.7 - 7.7 K/uL    Lymphocytes Relative 21  12 - 46 %    Lymphs Abs 1.5  0.7 - 4.0 K/uL    Monocytes Relative 14 (*) 3 - 12 %    Monocytes Absolute 1.0  0.1 - 1.0 K/uL    Eosinophils Relative 0  0 - 5 %    Eosinophils Absolute 0.0  0.0 - 0.7 K/uL    Basophils Relative 1  0 - 1 %    Basophils Absolute 0.1  0.0 - 0.1 K/uL   BASIC METABOLIC PANEL     Status: Abnormal   Collection Time   06/25/12  8:26 PM      Component Value Range Comment   Sodium 138  135 - 145 mEq/L    Potassium 3.5  3.5 - 5.1 mEq/L    Chloride 103  96 - 112 mEq/L    CO2 23  19 - 32 mEq/L    Glucose, Bld 125 (*) 70 - 99 mg/dL    BUN 10  6 - 23 mg/dL    Creatinine, Ser 9.52  0.50 - 1.10 mg/dL    Calcium 9.8  8.4 - 84.1 mg/dL    GFR calc non Af Amer 83 (*) >90 mL/min    GFR calc Af Amer >90  >90 mL/min   URINALYSIS, MICROSCOPIC ONLY     Status: Abnormal   Collection Time  06/25/12  9:09 PM      Component Value Range Comment   Color, Urine YELLOW  YELLOW    APPearance CLOUDY (*) CLEAR    Specific Gravity, Urine 1.021  1.005 - 1.030    pH 5.5  5.0 - 8.0    Glucose, UA NEGATIVE  NEGATIVE mg/dL    Hgb urine dipstick TRACE (*) NEGATIVE    Bilirubin Urine NEGATIVE  NEGATIVE    Ketones, ur NEGATIVE  NEGATIVE mg/dL    Protein, ur NEGATIVE  NEGATIVE mg/dL    Urobilinogen, UA 1.0  0.0 - 1.0 mg/dL    Nitrite POSITIVE (*) NEGATIVE    Leukocytes, UA LARGE (*) NEGATIVE    WBC, UA TOO NUMEROUS TO COUNT  <3 WBC/hpf WITH CLUMPS   RBC / HPF 0-2  <3 RBC/hpf    Bacteria, UA MANY (*) RARE    Squamous Epithelial / LPF FEW (*) RARE    Urine-Other MUCOUS PRESENT      Dg Chest 2 View  06/25/2012  *RADIOLOGY REPORT*  Clinical Data: Cough, fatigue.  CHEST - 2 VIEW  Comparison: 03/01/2011  Findings: The right posterior costophrenic angle is partially excluded.  Subsegmental atelectasis versus early infiltrate in the right lower lobe.  No effusion.  Heart size normal.  Regional bones unremarkable.  IMPRESSION:  1.  Right lower lobe subsegmental atelectasis versus early infiltrate.   Original Report Authenticated By: D. Andria Rhein, MD     Review of Systems  Constitutional: Positive for fever. Negative for chills, weight loss, malaise/fatigue and diaphoresis.  HENT: Negative for hearing loss, ear pain, nosebleeds,  congestion, sore throat, neck pain, tinnitus and ear discharge.   Eyes: Negative for blurred vision, double vision, photophobia, pain, discharge and redness.  Respiratory: Positive for cough. Negative for hemoptysis, sputum production, shortness of breath, wheezing and stridor.   Cardiovascular: Negative for chest pain, palpitations, orthopnea, claudication, leg swelling and PND.  Gastrointestinal: Negative for heartburn, nausea, vomiting, abdominal pain, diarrhea, constipation, blood in stool and melena.  Genitourinary: Negative for dysuria, urgency, frequency, hematuria and flank pain.  Musculoskeletal: Negative for myalgias, back pain, joint pain and falls.  Skin: Negative for itching and rash.  Neurological: Negative for dizziness, tingling, tremors, sensory change, speech change, focal weakness, seizures, loss of consciousness, weakness and headaches.  Endo/Heme/Allergies: Negative for environmental allergies and polydipsia. Does not bruise/bleed easily.  Psychiatric/Behavioral: Negative for depression, suicidal ideas, hallucinations, memory loss and substance abuse. The patient is not nervous/anxious and does not have insomnia.     Blood pressure 116/58, pulse 116, temperature 101.9 F (38.8 C), temperature source Oral, resp. rate 16, SpO2 98.00%. Physical Exam  Constitutional: She is oriented to person, place, and time. She appears well-developed and well-nourished.  HENT:  Head: Normocephalic and atraumatic.  Eyes: Conjunctivae normal and EOM are normal. Pupils are equal, round, and reactive to light. Right eye exhibits no discharge. Left eye exhibits no discharge. No scleral icterus.  Neck: Normal range of motion. Neck supple. No JVD present. No tracheal deviation present. No thyromegaly present.  Cardiovascular: Normal rate, regular rhythm and normal heart sounds.  Exam reveals no gallop and no friction rub.   No murmur heard. Respiratory: Effort normal and breath sounds normal. No  stridor. No respiratory distress. She has no wheezes. She has no rales. She exhibits no tenderness.  GI: Soft. Bowel sounds are normal. She exhibits no distension and no mass. There is no tenderness. There is no rebound and no guarding.  obese  Musculoskeletal: Normal range of motion. She exhibits edema.       + lymphedema  Lymphadenopathy:    She has no cervical adenopathy.  Neurological: She is alert and oriented to person, place, and time. She displays abnormal reflex. She exhibits normal muscle tone. Coordination normal.  Skin: Skin is warm and dry. No rash noted. No erythema. No pallor.       + lymphedema  Psychiatric: She has a normal mood and affect. Her behavior is normal. Judgment and thought content normal.     Assessment/Plan Fever Tachycardia secondary to ? Fever Cycle cardiac markers, tsh, consider echo Pneumonia cap: rocephin 1gm iv qday, and zithromax 500mg  iv qday, sputum gram stain, cx, and blood cx x 2 Uti rocephin Ms: stable Dm2: stable  Pearson Grippe 06/25/2012, 10:54 PM

## 2012-06-25 NOTE — ED Notes (Signed)
Per EMS pt has MS and CHF and is borderline diabetic  Pt states this morning she woke up not feeling well and just feeling weak all over so she went back to bed  Pt states she has not eaten or drank much today  Pt states she remains feeling bad and weak all over  Pt has nonproductive cough  Denies N/V/D  Pt became tachycardic with movement to the stretcher with EMS

## 2012-06-26 DIAGNOSIS — G35 Multiple sclerosis: Secondary | ICD-10-CM | POA: Diagnosis present

## 2012-06-26 HISTORY — DX: Multiple sclerosis: G35

## 2012-06-26 LAB — LEGIONELLA ANTIGEN, URINE: Legionella Antigen, Urine: NEGATIVE

## 2012-06-26 LAB — GLUCOSE, CAPILLARY
Glucose-Capillary: 178 mg/dL — ABNORMAL HIGH (ref 70–99)
Glucose-Capillary: 102 mg/dL — ABNORMAL HIGH (ref 70–99)

## 2012-06-26 LAB — STREP PNEUMONIAE URINARY ANTIGEN: Strep Pneumo Urinary Antigen: NEGATIVE

## 2012-06-26 LAB — CREATININE, SERUM
Creatinine, Ser: 0.73 mg/dL (ref 0.50–1.10)
GFR calc non Af Amer: >90 mL/min (ref >90)

## 2012-06-26 MED ORDER — SODIUM CHLORIDE 0.9 % IV BOLUS (SEPSIS)
500.0000 mL | Freq: Once | INTRAVENOUS | Status: AC
  Administered 2012-06-26: 500 mL via INTRAVENOUS

## 2012-06-26 MED ORDER — ENOXAPARIN SODIUM 40 MG/0.4ML ~~LOC~~ SOLN
40.0000 mg | SUBCUTANEOUS | Status: DC
  Administered 2012-06-26 – 2012-06-30 (×5): 40 mg via SUBCUTANEOUS
  Filled 2012-06-26 (×5): qty 0.4

## 2012-06-26 MED ORDER — BACLOFEN 10 MG PO TABS
10.0000 mg | ORAL_TABLET | Freq: Two times a day (BID) | ORAL | Status: DC
  Administered 2012-06-26 – 2012-06-30 (×10): 10 mg via ORAL
  Filled 2012-06-26 (×11): qty 1

## 2012-06-26 MED ORDER — INTERFERON BETA-1B 0.3 MG ~~LOC~~ KIT: 0.2500 mg | PACK | SUBCUTANEOUS | Status: DC

## 2012-06-26 MED ORDER — FUROSEMIDE 40 MG PO TABS
40.0000 mg | ORAL_TABLET | Freq: Two times a day (BID) | ORAL | Status: DC
  Filled 2012-06-26 (×3): qty 1

## 2012-06-26 MED ORDER — INFLUENZA VIRUS VACC SPLIT PF IM SUSP
0.5000 mL | Freq: Once | INTRAMUSCULAR | Status: AC
  Administered 2012-06-26: 0.5 mL via INTRAMUSCULAR
  Filled 2012-06-26: qty 0.5

## 2012-06-26 MED ORDER — SODIUM CHLORIDE 0.9 % IV SOLN
INTRAVENOUS | Status: DC
  Administered 2012-06-26 – 2012-06-29 (×6): via INTRAVENOUS

## 2012-06-26 MED ORDER — GUAIFENESIN 100 MG/5ML PO SYRP
200.0000 mg | ORAL_SOLUTION | ORAL | Status: DC | PRN
  Administered 2012-06-26 (×2): 200 mg via ORAL
  Filled 2012-06-26 (×3): qty 118

## 2012-06-26 MED ORDER — BIOTENE DRY MOUTH MT LIQD
15.0000 mL | Freq: Two times a day (BID) | OROMUCOSAL | Status: DC
  Administered 2012-06-26 – 2012-06-30 (×8): 15 mL via OROMUCOSAL

## 2012-06-26 MED ORDER — TIZANIDINE HCL 4 MG PO TABS: 4.0000 mg | ORAL_TABLET | Freq: Two times a day (BID) | ORAL | Status: DC

## 2012-06-26 MED ORDER — ALBUTEROL SULFATE (5 MG/ML) 0.5% IN NEBU
2.5000 mg | INHALATION_SOLUTION | Freq: Four times a day (QID) | RESPIRATORY_TRACT | Status: DC
  Administered 2012-06-26 – 2012-06-27 (×2): 2.5 mg via RESPIRATORY_TRACT
  Filled 2012-06-26 (×2): qty 0.5

## 2012-06-26 MED ORDER — LISINOPRIL 10 MG PO TABS
10.0000 mg | ORAL_TABLET | Freq: Every day | ORAL | Status: DC
  Filled 2012-06-26: qty 1

## 2012-06-26 MED ORDER — FERROUS SULFATE 75 (15 FE) MG/ML PO SOLN: 15.0000 mg | Freq: Every day | ORAL | Status: DC

## 2012-06-26 MED ORDER — OMEGA-3-ACID ETHYL ESTERS 1 G PO CAPS
1.0000 g | ORAL_CAPSULE | Freq: Every day | ORAL | Status: DC
  Administered 2012-06-26 – 2012-06-30 (×5): 1 g via NASOGASTRIC
  Filled 2012-06-26 (×5): qty 1

## 2012-06-26 MED ORDER — ALBUTEROL SULFATE (5 MG/ML) 0.5% IN NEBU
2.5000 mg | INHALATION_SOLUTION | Freq: Four times a day (QID) | RESPIRATORY_TRACT | Status: DC | PRN
  Administered 2012-06-26 – 2012-06-29 (×3): 2.5 mg via RESPIRATORY_TRACT
  Filled 2012-06-26 (×3): qty 0.5

## 2012-06-26 MED ORDER — SIMVASTATIN 5 MG PO TABS
5.0000 mg | ORAL_TABLET | Freq: Every day | ORAL | Status: DC
  Administered 2012-06-26 – 2012-06-29 (×4): 5 mg via ORAL
  Filled 2012-06-26 (×5): qty 1

## 2012-06-26 MED ORDER — FERROUS SULFATE 325 (65 FE) MG PO TABS
325.0000 mg | ORAL_TABLET | Freq: Every day | ORAL | Status: DC
  Administered 2012-06-26 – 2012-06-27 (×2): 325 mg via ORAL
  Filled 2012-06-26 (×4): qty 1

## 2012-06-26 MED ORDER — DEXTROSE 5 % IV SOLN
500.0000 mg | INTRAVENOUS | Status: DC
  Administered 2012-06-26 – 2012-06-28 (×3): 500 mg via INTRAVENOUS
  Filled 2012-06-26 (×3): qty 500

## 2012-06-26 MED ORDER — GLIPIZIDE ER 2.5 MG PO TB24
2.5000 mg | ORAL_TABLET | Freq: Every day | ORAL | Status: DC
  Administered 2012-06-26 – 2012-06-30 (×5): 2.5 mg via ORAL
  Filled 2012-06-26 (×6): qty 1

## 2012-06-26 MED ORDER — DARIFENACIN HYDROBROMIDE ER 7.5 MG PO TB24
7.5000 mg | ORAL_TABLET | Freq: Every day | ORAL | Status: DC
  Administered 2012-06-26 – 2012-06-30 (×5): 7.5 mg via ORAL
  Filled 2012-06-26 (×5): qty 1

## 2012-06-26 MED ORDER — VITAMIN C 250 MG PO TABS
250.0000 mg | ORAL_TABLET | Freq: Every day | ORAL | Status: DC
  Administered 2012-06-26 – 2012-06-30 (×5): 250 mg via ORAL
  Filled 2012-06-26 (×5): qty 1

## 2012-06-26 NOTE — Progress Notes (Signed)
Subjective: Pt feel some better No N/V/ SOB Fever Low BP   Objective: Vital signs in last 24 hours: Temp:  [98.9 F (37.2 C)-101.9 F (38.8 C)] 100.2 F (37.9 C) (01/14 0548) Pulse Rate:  [94-116] 97  (01/14 0548) Resp:  [16] 16  (01/14 0117) BP: (86-116)/(42-58) 86/56 mmHg (01/14 0629) SpO2:  [90 %-98 %] 93 % (01/14 0548) Weight:  [84.2 kg (185 lb 10 oz)-85.8 kg (189 lb 2.5 oz)] 85.8 kg (189 lb 2.5 oz) (01/14 0520) Weight change:  Last BM Date: 06/25/12  Intake/Output from previous day: 01/13 0701 - 01/14 0700 In: 235 [P.O.:235] Out: 150 [Urine:150] Intake/Output this shift:    General appearance: alert Resp: clear to auscultation bilaterally Cardio: regular rate and rhythm GI: soft, non-tender; bowel sounds normal; no masses,  no organomegaly Extremities: edema Chronic +3  Lab Results:  Basename 06/25/12 2026  WBC 7.1  HGB 12.6  HCT 38.4  PLT 265   BMET  Basename 06/25/12 2341 06/25/12 2026  NA -- 138  K -- 3.5  CL -- 103  CO2 -- 23  GLUCOSE -- 125*  BUN -- 10  CREATININE 0.73 0.80  CALCIUM -- 9.8    Studies/Results: Dg Chest 2 View  06/25/2012  *RADIOLOGY REPORT*  Clinical Data: Cough, fatigue.  CHEST - 2 VIEW  Comparison: 03/01/2011  Findings: The right posterior costophrenic angle is partially excluded.  Subsegmental atelectasis versus early infiltrate in the right lower lobe.  No effusion.  Heart size normal.  Regional bones unremarkable.  IMPRESSION:  1.  Right lower lobe subsegmental atelectasis versus early infiltrate.   Original Report Authenticated By: D. Andria Rhein, MD     Medications: I have reviewed the patient's current medications.  Assessment/Plan: Right LL PNA- continue O2/ IV Abx UTI-  Culture pending; continue Antibiotic HTN: low BP- hold off Lasix and Lisinopril- IVF DM- SS insulin, glipizide MS- non ambulatory- baclofen.  Chronic Edema legs/ venous insufficiency compression stocking  LOS: 1 day   Karmah Potocki 06/26/2012,  7:18 AM

## 2012-06-26 NOTE — Progress Notes (Signed)
Patient's BP is 86/56 manually this morning. Patient is asymptomatic. NP on call, Donnamarie Poag, was notified and no new orders were placed.

## 2012-06-26 NOTE — Progress Notes (Signed)
   CARE MANAGEMENT NOTE 06/26/2012  Patient:  [name], [name]   Account Number:  [id number]  Date Initiated:  06/26/2012  Documentation initiated by:  Jiles Crocker  Subjective/Objective Assessment:   ADMITTED WITH WEAKNESS, PNEUMONIA     Action/Plan:   Pcp: Tyson Dense  LIVES WITH SPOUSE   Anticipated DC Date:  07/03/2012   Anticipated DC Plan:  HOME/SELF CARE      DC Planning Services  CM consult          Status of service:  In process, will continue to follow Medicare Important Message given?  NA - LOS <3 / Initial given by admissions (If response is "NO", the following Medicare IM given date fields will be blank)  Per UR Regulation:  Reviewed for med. necessity/level of care/duration of stay  Comments:  06/26/2012- B Mazen Marcin RN,BSN,MHA

## 2012-06-26 NOTE — Progress Notes (Signed)
Nutrition Brief Note  Patient identified on the Malnutrition Screening Tool (MST) Report  Body mass index is 30.53 kg/(m^2). Patient meets criteria for obese class I based on current BMI.   Current diet order is CHO Mod Low. Labs and medications reviewed.   Pt with h/o MS admitted with cough and found to have Pneumonia.  Pt reports her appetite is improving.  She states she gained some wt recently over the holidays.  She lives with her ex-boyfriend who assists with grocery shopping and cooking.  Pt states she is able to get food appropriate for her therapeutic diet.  Denies nutrition-related concerns or education needs.  No nutrition interventions warranted at this time. If nutrition issues arise, please consult RD.   Loyce Dys, MS RD LDN Clinical Inpatient Dietitian Pager: [phone] Weekend/After hours pager: [phone]

## 2012-06-26 NOTE — Progress Notes (Signed)
Patient being rounded on by Dr. Georgann Housekeeper.  As such [facility] will sign off. Any medical concerns should be addressed to Dr. Donette Larry.  [name], [employer]

## 2012-06-27 ENCOUNTER — Inpatient Hospital Stay (HOSPITAL_COMMUNITY): Payer: PRIVATE HEALTH INSURANCE

## 2012-06-27 LAB — URINE CULTURE

## 2012-06-27 LAB — IRON AND TIBC
Iron: 27 ug/dL — ABNORMAL LOW (ref 42–135)
UIBC: 168 ug/dL (ref 125–400)

## 2012-06-27 LAB — CBC
HCT: 28.4 % — ABNORMAL LOW (ref 36.0–46.0)
Hemoglobin: 9.5 g/dL — ABNORMAL LOW (ref 12.0–15.0)
MCHC: 33.5 g/dL (ref 30.0–36.0)
RDW: 16.2 % — ABNORMAL HIGH (ref 11.5–15.5)
WBC: 5.6 10*3/uL (ref 4.0–10.5)

## 2012-06-27 LAB — BASIC METABOLIC PANEL
BUN: 9 mg/dL (ref 6–23)
Chloride: 110 mEq/L (ref 96–112)
GFR calc Af Amer: >90 mL/min (ref >90)
GFR calc non Af Amer: >90 mL/min (ref >90)
Glucose, Bld: 92 mg/dL (ref 70–99)
Potassium: 3.9 mEq/L (ref 3.5–5.1)
Sodium: 139 mEq/L (ref 135–145)

## 2012-06-27 LAB — GLUCOSE, CAPILLARY: Glucose-Capillary: 93 mg/dL (ref 70–99)

## 2012-06-27 MED ORDER — GUAIFENESIN 100 MG/5ML PO SOLN
200.0000 mg | ORAL | Status: DC | PRN
  Administered 2012-06-28 – 2012-06-30 (×8): 200 mg via ORAL
  Filled 2012-06-27 (×7): qty 10

## 2012-06-27 MED ORDER — ALBUTEROL SULFATE (5 MG/ML) 0.5% IN NEBU
2.5000 mg | INHALATION_SOLUTION | Freq: Three times a day (TID) | RESPIRATORY_TRACT | Status: DC
  Administered 2012-06-27 – 2012-06-30 (×10): 2.5 mg via RESPIRATORY_TRACT
  Filled 2012-06-27 (×9): qty 0.5

## 2012-06-27 MED ORDER — ACETAMINOPHEN 325 MG PO TABS
650.0000 mg | ORAL_TABLET | Freq: Four times a day (QID) | ORAL | Status: DC | PRN
  Administered 2012-06-27: 650 mg via ORAL
  Filled 2012-06-27: qty 2

## 2012-06-27 MED ORDER — DOCUSATE SODIUM 100 MG PO CAPS
100.0000 mg | ORAL_CAPSULE | Freq: Two times a day (BID) | ORAL | Status: DC
  Administered 2012-06-27 – 2012-06-28 (×3): 100 mg via ORAL
  Filled 2012-06-27 (×8): qty 1

## 2012-06-27 NOTE — Progress Notes (Signed)
Subjective: No c/o BP better  Objective: Vital signs in last 24 hours: Temp:  [98.9 F (37.2 C)-100.6 F (38.1 C)] 99.2 F (37.3 C) (01/15 0528) Pulse Rate:  [80-105] 80  (01/15 0528) Resp:  [20] 20  (01/15 0528) BP: (92-106)/(53-58) 106/58 mmHg (01/15 0528) SpO2:  [94 %-100 %] 100 % (01/15 0528) Weight:  [87 kg (191 lb 12.8 oz)] 87 kg (191 lb 12.8 oz) (01/15 0528) Weight change: 2.8 kg (6 lb 2.8 oz) Last BM Date: 06/25/12  Intake/Output from previous day: 01/14 0701 - 01/15 0700 In: 3060.4 [I.V.:2760.4; IV Piggyback:300] Out: 725 [Urine:725] Intake/Output this shift:    General appearance: alert Resp: rhonchi bibasilar Cardio: regular rate and rhythm GI: soft, non-tender; bowel sounds normal; no masses,  no organomegaly  Lab Results:  [facility] 06/27/12 0432 06/25/12 2026  WBC 5.6 7.1  HGB 9.5* 12.6  HCT 28.4* 38.4  PLT 262 265   BMET  Basename 06/27/12 0432 06/25/12 2341 06/25/12 2026  NA 139 -- 138  K 3.9 -- 3.5  CL 110 -- 103  CO2 21 -- 23  GLUCOSE 92 -- 125*  BUN 9 -- 10  CREATININE 0.62 0.73 --  CALCIUM 8.6 -- 9.8    Studies/Results: Dg Chest 2 View  06/25/2012  *RADIOLOGY REPORT*  Clinical Data: Cough, fatigue.  CHEST - 2 VIEW  Comparison: 03/01/2011  Findings: The right posterior costophrenic angle is partially excluded.  Subsegmental atelectasis versus early infiltrate in the right lower lobe.  No effusion.  Heart size normal.  Regional bones unremarkable.  IMPRESSION:  1.  Right lower lobe subsegmental atelectasis versus early infiltrate.   Original Report Authenticated By: D. Andria Rhein, MD     Medications: I have reviewed the patient's current medications.  Assessment/Plan: Right LL PNA- continue O2/ IV Abx - albuterol neb; repeat Chest xray UTI- Cultureshow E coli-  continue Antibiotic  HTN: low BP- hold off Lasix and Lisinopril- IVF  Anemia: ACD recheck CBC DM- SS insulin, glipizide  MS- non ambulatory- baclofen. bateseron injection from  home- patient family will get  Chronic Edema legs/ venous insufficiency compression stocking PT consult   LOS: 2 days   Aprille Sawhney 06/27/2012, 7:18 AM

## 2012-06-28 LAB — GLUCOSE, CAPILLARY
Glucose-Capillary: 165 mg/dL — ABNORMAL HIGH (ref 70–99)
Glucose-Capillary: 143 mg/dL — ABNORMAL HIGH (ref 70–99)

## 2012-06-28 LAB — CBC
HCT: 29.9 % — ABNORMAL LOW (ref 36.0–46.0)
Hemoglobin: 9.9 g/dL — ABNORMAL LOW (ref 12.0–15.0)
MCH: 29.9 pg (ref 26.0–34.0)
MCHC: 33.1 g/dL (ref 30.0–36.0)
MCV: 90.3 fL (ref 78.0–100.0)
RDW: 16 % — ABNORMAL HIGH (ref 11.5–15.5)

## 2012-06-28 MED ORDER — ALBUTEROL SULFATE HFA 108 (90 BASE) MCG/ACT IN AERS
2.0000 | INHALATION_SPRAY | RESPIRATORY_TRACT | Status: DC | PRN
  Administered 2012-06-30: 2 via RESPIRATORY_TRACT
  Filled 2012-06-28: qty 6.7

## 2012-06-28 MED ORDER — FERROUS SULFATE 325 (65 FE) MG PO TABS
325.0000 mg | ORAL_TABLET | Freq: Two times a day (BID) | ORAL | Status: DC
  Administered 2012-06-28 – 2012-06-30 (×5): 325 mg via ORAL
  Filled 2012-06-28 (×7): qty 1

## 2012-06-28 NOTE — Evaluation (Signed)
Physical Therapy Evaluation Patient Details Name: [name] MRN: [id number] DOB: [birth year] Today's Date: 06/28/2012 Time: 1009-1100 PT Time Calculation (min): 51 min  PT Assessment / Plan / Recommendation Clinical Impression  Pt. was admitted 06/25/12 with pneumonia, change in cognition. Pt. has H/O MS and is essentially paraplegic with decreased RUE function. Pt. lives at home with boyfriend who she reports is her caregiver and provides all care, assistance with transfers. Pt. wants to return home. Pt will benefit from PT while in acute care. Pt. had coughing aspell and had significant LE spasms.     PT Assessment  Patient needs continued PT services    Follow Up Recommendations  Home health PT    Does the patient have the potential to tolerate intense rehabilitation      Barriers to Discharge        Equipment Recommendations  None recommended by PT    Recommendations for Other Services     Frequency Min 3X/week    Precautions / Restrictions Precautions Precautions: Fall Precaution Comments: spasticity of LE's. essentially paraplegia   Pertinent Vitals/Pain Pt on 2l had significant coughing spell after mobilizing.      Mobility  Bed Mobility Bed Mobility: Rolling Right;Rolling Left;Right Sidelying to Sit;Sit to Sidelying Right Rolling Right: 2: Max assist Rolling Left: 2: Max assist Right Sidelying to Sit: 2: Max assist Sit to Sidelying Right: 1: +2 Total assist Sit to Sidelying Right: Patient Percentage: 0% Details for Bed Mobility Assistance: Pt able to assist rolling with UE's and reaching for rail. Legs have to be assisted  as pt is not able to initiate flexing legs. Pt. requires lifting assistance to get to an upright position. Transfers Transfers: Not assessed    Shoulder Instructions     Exercises     PT Diagnosis: Generalized weakness (paraplegia)  PT Problem List: Decreased strength;Decreased range of motion;Decreased activity tolerance;Decreased  balance;Decreased mobility;Cardiopulmonary status limiting activity PT Treatment Interventions: Functional mobility training;Therapeutic activities;Therapeutic exercise;Balance training;Patient/family education   PT Goals Acute Rehab PT Goals PT Goal Formulation: With patient Time For Goal Achievement: 07/12/12 Potential to Achieve Goals: Fair Pt will Roll Supine to Right Side: with min assist PT Goal: Rolling Supine to Right Side - Progress: Goal set today Pt will Roll Supine to Left Side: with min assist PT Goal: Rolling Supine to Left Side - Progress: Goal set today Pt will go Supine/Side to Sit: with mod assist PT Goal: Supine/Side to Sit - Progress: Goal set today Pt will Sit at Edge of Bed: with supervision;3-5 min;with bilateral upper extremity support PT Goal: Sit at Edge Of Bed - Progress: Goal set today Pt will go Sit to Supine/Side: with mod assist PT Goal: Sit to Supine/Side - Progress: Goal set today Pt will Transfer Bed to Chair/Chair to Bed: with max assist PT Transfer Goal: Bed to Chair/Chair to Bed - Progress: Goal set today  Visit Information  Last PT Received On: 06/28/12 Assistance Needed: +2    Subjective Data  Subjective: My boyfriend helps me transfer Patient Stated Goal: to go home, not snf   Prior Functioning  Home Living Home Adaptive Equipment: Shower chair with back;Walker - rolling;Wheelchair - powered;Grab bars around toilet;Grab bars in shower Additional Comments: 1 under mattress rail  Prior Function Level of Independence: Needs assistance Needs Assistance: Bathing;Dressing;Grooming;Toileting;Meal Prep;Transfers Bath: Supervision/set-up Dressing: Minimal Toileting: Maximal Meal Prep: Minimal Transfer Assistance: stand pivot transfer Communication Communication:  (speech is with some effort)    Cognition  Overall Cognitive Status: Appears  within functional limits for tasks assessed/performed Arousal/Alertness: Awake/alert Orientation  Level: Appears intact for tasks assessed Behavior During Session: [facility] for tasks performed    Extremity/Trunk Assessment Right Upper Extremity Assessment RUE ROM/Strength/Tone: Deficits RUE ROM/Strength/Tone Deficits: decreased fine motor of hand,noted active movement of elbow and shoulder but is rigid Left Upper Extremity Assessment LUE ROM/Strength/Tone: Deficits LUE ROM/Strength/Tone Deficits: has more control of fine motor and movements are not as rigid as R Right Lower Extremity Assessment RLE ROM/Strength/Tone: Deficits RLE ROM/Strength/Tone Deficits: no active volitional flexion, noted extensor spasticity RLE Sensation: Deficits RLE Sensation Deficits: impaired LT Left Lower Extremity Assessment LLE ROM/Strength/Tone: Deficits LLE ROM/Strength/Tone Deficits: same as RLE LLE Sensation: Deficits LLE Sensation Deficits: impaired LT Trunk Assessment Trunk Assessment: Kyphotic   Balance Static Sitting Balance Static Sitting - Balance Support: Bilateral upper extremity supported;Feet supported Static Sitting - Level of Assistance: 2: Max assist Static Sitting - Comment/# of Minutes: pt with minimal righting reactions, tends to lean to R more. requires asssitance for balance.  End of Session PT - End of Session Activity Tolerance: Patient limited by fatigue Patient left: in bed;with call bell/phone within reach;with nursing in room Nurse Communication: Mobility status;Need for lift equipment  GP     Rada Hay 06/28/2012, 2:48 PM  [phone]

## 2012-06-28 NOTE — Progress Notes (Signed)
Subjective: Pt c/o leg pain with compression stocking Still some cough Xray ok  Objective: Vital signs in last 24 hours: Temp:  [98.3 F (36.8 C)-99.1 F (37.3 C)] 98.3 F (36.8 C) (01/16 0555) Pulse Rate:  [81-94] 81  (01/16 0555) Resp:  [20-22] 22  (01/16 0555) BP: (101-112)/(55-88) 110/55 mmHg (01/16 0555) SpO2:  [93 %-100 %] 97 % (01/16 0555) Weight:  [89.812 kg (198 lb)] 89.812 kg (198 lb) (01/16 0555) Weight change: 2.812 kg (6 lb 3.2 oz) Last BM Date: 06/25/12  Intake/Output from previous day: 01/15 0701 - 01/16 0700 In: 3991.3 [P.O.:960; I.V.:3031.3] Out: 951 [Urine:950; Stool:1] Intake/Output this shift:    General appearance: alert Resp: rhonchi bibasilar GI: soft, non-tender; bowel sounds normal; no masses,  no organomegaly Extremities: edema +3  Lab Results:  Basename 06/28/12 0448 06/27/12 0432  WBC 6.7 5.6  HGB 9.9* 9.5*  HCT 29.9* 28.4*  PLT 278 262   BMET  Basename 06/27/12 0432 06/25/12 2341 06/25/12 2026  NA 139 -- 138  K 3.9 -- 3.5  CL 110 -- 103  CO2 21 -- 23  GLUCOSE 92 -- 125*  BUN 9 -- 10  CREATININE 0.62 0.73 --  CALCIUM 8.6 -- 9.8    Studies/Results: Dg Chest 1 View  06/27/2012  *RADIOLOGY REPORT*  Clinical Data: 52 year old female cough congestion and sweats.  CHEST - 1 VIEW  Comparison: 06/25/2012 and earlier.  Findings: AP semi upright view at 1057 hours.  Suboptimal radiographic technique.  Stable low lung volumes.  Stable cardiac size and mediastinal contours.  No pneumothorax, pleural effusion or consolidation.  No overt pulmonary edema.  IMPRESSION: Low lung volumes and suboptimal portable technique.  No acute cardiopulmonary abnormality identified.   Original Report Authenticated By: Erskine Speed, M.D.     Medications: I have reviewed the patient's current medications.  Assessment/Plan: Right LL PNA- repeat xray ok, h/o Toboacco use- wheezing- start Alb MDI- with teaching- quit tob use UTI- E coli-  continue Antibiotic    EXB:MWUXLK- med on hold Anemia: ACD riron low- replace iron DM- SS insulin, glipizide  MS- non ambulatory- baclofen. bateseron injection from home- patient family will get  Chronic Edema legs/ venous insufficiency cannot tolerate compression stocking PT consult   LOS: 3 days   [name] 06/28/2012, 7:20 AM

## 2012-06-28 NOTE — Plan of Care (Signed)
Problem: Phase I Progression Outcomes Goal: Progress activity as tolerated unless otherwise ordered Outcome: Not Applicable Date Met:  06/28/12 Pt WC bound

## 2012-06-29 LAB — GLUCOSE, CAPILLARY
Glucose-Capillary: 123 mg/dL — ABNORMAL HIGH (ref 70–99)
Glucose-Capillary: 145 mg/dL — ABNORMAL HIGH (ref 70–99)

## 2012-06-29 MED ORDER — SULFAMETHOXAZOLE-TMP DS 800-160 MG PO TABS
1.0000 | ORAL_TABLET | Freq: Two times a day (BID) | ORAL | Status: DC
  Administered 2012-06-29 – 2012-06-30 (×3): 1 via ORAL
  Filled 2012-06-29 (×4): qty 1

## 2012-06-29 MED ORDER — FERROUS SULFATE 325 (65 FE) MG PO TABS: 325.0000 mg | ORAL_TABLET | Freq: Two times a day (BID) | ORAL | Status: DC

## 2012-06-29 MED ORDER — SULFAMETHOXAZOLE-TMP DS 800-160 MG PO TABS: 1.0000 | ORAL_TABLET | Freq: Two times a day (BID) | ORAL | Status: AC

## 2012-06-29 MED ORDER — FUROSEMIDE 40 MG PO TABS: 40.0000 mg | ORAL_TABLET | Freq: Every day | ORAL | Status: DC

## 2012-06-29 MED ORDER — METHYLPREDNISOLONE SODIUM SUCC 125 MG IJ SOLR
125.0000 mg | Freq: Once | INTRAMUSCULAR | Status: AC
  Administered 2012-06-29: 125 mg via INTRAVENOUS
  Filled 2012-06-29: qty 2

## 2012-06-29 MED ORDER — ALBUTEROL SULFATE HFA 108 (90 BASE) MCG/ACT IN AERS: 2.0000 | INHALATION_SPRAY | RESPIRATORY_TRACT | Status: DC | PRN

## 2012-06-29 NOTE — Progress Notes (Signed)
Home Health PT and Aide services set up with [facility] which was pt's choice.

## 2012-06-29 NOTE — Progress Notes (Signed)
Subjective: Feeling better   Objective: Vital signs in last 24 hours: Temp:  [98 F (36.7 C)-99.1 F (37.3 C)] 98 F (36.7 C) (01/17 0542) Pulse Rate:  [83-96] 96  (01/17 0542) Resp:  [20-24] 20  (01/17 0542) BP: (116-129)/(61-70) 122/61 mmHg (01/17 0542) SpO2:  [97 %-100 %] 100 % (01/17 0542) Weight:  [90.084 kg (198 lb 9.6 oz)-92.1 kg (203 lb 0.7 oz)] 92.1 kg (203 lb 0.7 oz) (01/17 0542) Weight change: 0.272 kg (9.6 oz) Last BM Date: 06/28/12  Intake/Output from previous day: 01/16 0701 - 01/17 0700 In: 1360 [P.O.:460; I.V.:900] Out: 522 [Urine:520; Stool:2] Intake/Output this shift:    General appearance: alert Resp: wheezes bibasilar Cardio: regular rate and rhythm  Lab Results:  Basename 06/28/12 0448 06/27/12 0432  WBC 6.7 5.6  HGB 9.9* 9.5*  HCT 29.9* 28.4*  PLT 278 262   BMET  Basename 06/27/12 0432  NA 139  K 3.9  CL 110  CO2 21  GLUCOSE 92  BUN 9  CREATININE 0.62  CALCIUM 8.6    Studies/Results: Dg Chest 1 View  06/27/2012  *RADIOLOGY REPORT*  Clinical Data: 52 year old female cough congestion and sweats.  CHEST - 1 VIEW  Comparison: 06/25/2012 and earlier.  Findings: AP semi upright view at 1057 hours.  Suboptimal radiographic technique.  Stable low lung volumes.  Stable cardiac size and mediastinal contours.  No pneumothorax, pleural effusion or consolidation.  No overt pulmonary edema.  IMPRESSION: Low lung volumes and suboptimal portable technique.  No acute cardiopulmonary abnormality identified.   Original Report Authenticated By: Erskine Speed, M.D.     Medications: I have reviewed the patient's current medications.  Assessment/Plan: Right LL PNA- repeat xray ok, h/o Toboacco use- wheezing-Alb MDI- with teaching- quit tob use - dose of solumedrol 125 mg today UTI- E coli- continue Antibiotic - change to po septra DS ZOX:WRUEAV- med on hold - start lasix in 3 days Anemia: ACD riron low- replace iron  DM- SS insulin, glipizide  MS- non  ambulatory- baclofen. bateseron injection from home- patient family will get  Chronic Edema legs/ venous insufficiency cannot tolerate compression stocking  HHN/ PT D/c home tomorrow   LOS: 4 days   Malisa Ruggiero 06/29/2012, 7:23 AM

## 2012-06-30 LAB — GLUCOSE, CAPILLARY: Glucose-Capillary: 190 mg/dL — ABNORMAL HIGH (ref 70–99)

## 2012-06-30 NOTE — Progress Notes (Signed)
Pt oxygen saturations 97% on room air at rest.

## 2012-06-30 NOTE — Progress Notes (Signed)
Patient has no new issues today. We do need to be sure that O2 sat is adequate on RA prior to d/c. Discussed with nurse. Orders entered.

## 2012-07-02 LAB — CULTURE, BLOOD (ROUTINE X 2)
Culture: NO GROWTH
Culture: NO GROWTH

## 2012-07-05 NOTE — Discharge Summary (Signed)
Physician Discharge Summary  Patient ID: [name] MRN: [id number] DOB/AGE: Oct 15, 1960 52 y.o.  Admit date: 06/25/2012 Discharge date: 07/05/2012  Admission Diagnoses:  Discharge Diagnoses:  Principal Problem:  *Community acquired pneumonia-right lower lobe Tobacco use; wheezing Active Problems:  UTI (lower urinary tract infection)-Escherichia coli of the culture  Multiple sclerosis  Bilateral leg edema  Diabetes mellitus  Anemia   Discharged Condition: good  Hospital Course: 52 year old female with history of multiple sclerosis neurogenic bladder;chronic leg edema hypertension diabetes admitted with weakness fever cough Problem #1: chest x-ray showed possible right lower lobe pneumonia versus atelectasis; he was started on broad-spectrum antibiotics Rocephin and Zithromax oxygen and nebulizer. Patient history of tobacco use had some wheezing. Repeat chest x-ray was much improved. Discussed about tobacco cessation patient is committed to quit tobacco. Started on albuterol MDIs. Problem #2: UTI with culture growing Escherichia coli sensitive to Septra patient was switched to Septra DS for 10 days. Her Foley was discontinued. Continue to encourage her p.o. Fluids. Problem #3 low blood pressure: no evidence of sepsis patient was started on IV fluids her blood pressure medication was held; blood pressure. We'll continue to hold the lisinopril;recheck blood pressure patient. Restarted  back on the Lasix 40 mg once a day in place of twice a day.at the time of discharge. Blood pressure remained stable in the 120s Problem #4: MS: patient was not taking her Betaseron because of the prescription it was called in she will restart back; continue her baclofen Diabetes: continue glipizide Chronic leg edema: continue Lasix once a day she cannot tolerate compression stockings because of the discomfort. Patient is Limited ambulation Physical therapy at home Anemia patient was sarted on iron  supplements: folic acid, B12 level was normal.  Consults: None  Significant Diagnostic Studies: labs: blood counts hemoglobin 9.5 creatinine normal urine culture showed Escherichia coli, blood chemistries normal WBC count normal and radiology: CXR: infiltrates: lower lobe on the right  Treatments: IV hydration and antibiotics: ceftriaxone  Discharge Exam: Blood pressure 125/85, pulse 92, temperature 98.4 F (36.9 C), temperature source Oral, resp. rate 16, height 5\' 6"  (1.676 m), weight 91.8 kg (202 lb 6.1 oz), SpO2 96.00%. General appearance: alert Resp: clear to auscultation bilaterally Cardio: regular rate and rhythm Extremities: edema 3+  Disposition: 30-Still a Patient  Discharge Orders    Future Orders Please Complete By Expires   Diet - low sodium heart healthy          Medication List     As of 07/05/2012  8:36 AM    STOP taking these medications         lisinopril 10 MG tablet   Commonly known as: PRINIVIL,ZESTRIL      TAKE these medications         albuterol 108 (90 BASE) MCG/ACT inhaler   Commonly known as: PROVENTIL HFA;VENTOLIN HFA   Inhale 2 puffs into the lungs every 4 (four) hours as needed for wheezing or shortness of breath.      baclofen 10 MG tablet   Commonly known as: LIORESAL   Take 10 mg by mouth 2 (two) times daily.      ferrous sulfate 325 (65 FE) MG tablet   Take 1 tablet (325 mg total) by mouth 2 (two) times daily.      FISH OIL PO   Take 1 capsule by mouth daily.      furosemide 40 MG tablet   Commonly known as: LASIX   Take 1 tablet (40 mg total)  by mouth daily.      GLIPIZIDE XL 2.5 MG 24 hr tablet   Generic drug: glipiZIDE   Take 2.5 mg by mouth daily.      lovastatin 20 MG tablet   Commonly known as: MEVACOR   Take 20 mg by mouth daily.      solifenacin 5 MG tablet   Commonly known as: VESICARE   Take 5 mg by mouth daily.      sulfamethoxazole-trimethoprim 800-160 MG per tablet   Commonly known as: BACTRIM DS   Take 1  tablet by mouth 2 (two) times daily. For 10 days      tiZANidine 4 MG tablet   Commonly known as: ZANAFLEX   Take 4 mg by mouth 2 (two) times daily.      VITAMIN C PO   Take 1 tablet by mouth daily.       discharge taken 35 minutes.  SignedGeorgann Housekeeper 07/05/2012, 8:36 AM

## 2012-09-20 ENCOUNTER — Ambulatory Visit (INDEPENDENT_AMBULATORY_CARE_PROVIDER_SITE_OTHER): Payer: PRIVATE HEALTH INSURANCE | Admitting: Neurology

## 2012-09-20 ENCOUNTER — Encounter: Payer: Self-pay | Admitting: Neurology

## 2012-09-20 VITALS — BP 114/78 | HR 81

## 2012-09-20 DIAGNOSIS — R609 Edema, unspecified: Secondary | ICD-10-CM

## 2012-09-20 DIAGNOSIS — R739 Hyperglycemia, unspecified: Secondary | ICD-10-CM

## 2012-09-20 DIAGNOSIS — R7309 Other abnormal glucose: Secondary | ICD-10-CM

## 2012-09-20 DIAGNOSIS — G35D Multiple sclerosis, unspecified: Secondary | ICD-10-CM

## 2012-09-20 DIAGNOSIS — I10 Essential (primary) hypertension: Secondary | ICD-10-CM

## 2012-09-20 DIAGNOSIS — G35 Multiple sclerosis: Secondary | ICD-10-CM

## 2012-09-20 NOTE — Progress Notes (Signed)
[name] is a 52 years old right-handed African American female, came in wheelchair, brought in by transportation, alone at today's visit, she is referred by her primary care physician Dr. Donette Larry for  evaluation of multiple sclerosis   She presented with right optic neuritis in 1995, later she also developed weakness in the right side, paying her left lateral, progressive worsening difficulty, eventually multiple sclerosis diagnosis was made at age 56 in 2002 by Dr. Arvella Merles, who was still practicing in [place] [facility] at [facility], she was also evaluated by Drs. Tinnie Gens, she was treated with Betaseron  Since 2002.   She had a gradual worsening of her gait difficulty, since 2002, she was able to driving, walking with a cane, eventually went home wheelchair around to 2008, she also loss to follow up from her neurologist, she developed bilateral lower extremity Severe lymph edema. No wheelchair-bound, no significant movement of her lower extremity, she has bladder incontinence, there is no significant incontinence unless she has diarrhea   She has poor dentition, she is going through some dental work, she denies swallowing difficulty, no dysarthria, no dysphagia  She has developed Pneumonia in January 2014 she is no longer taking Betaseron since 2011, she does not like the side effects, and also needle, there was no significant flareup anymore,   Review of Systems  Out of a complete 14 system review, the patient complains of only the following symptoms, and all other reviewed systems are negative.   Constitutional:   N/A Cardiovascular:  Swelling in legs. Ear/Nose/Throat:  Trouble swallowing Skin: moles Eyes: N/A Respiratory: N/A Gastroitestinal: incontinence.    Hematology/Lymphatic:  N/A Endocrine:  N/A Musculoskeletal:  Joints pain, joint swelling Allergy/Immunology: N/A Neurological: difficulty swallowing Psychiatric:    Depression, anxiety, not enough sleep.  PHYSICAL  EXAMINATOINS:  Generalized: In no acute distress  Neck: Supple, no carotid bruits   Cardiac: Regular rate rhythm  Pulmonary: Clear to auscultation bilaterally  Musculoskeletal: prominent bilateral lower extremity edema.  Neurological examination  Mentation: Alert oriented to time, place, history taking, and causual conversation, sitting in wheel chair, she has very poor dentation.  Cranial nerve II-XII: Pupils were equal round reactive to light extraocular movements were full, visual field were full on confrontational test. facial sensation and strength were normal. hearing was intact to finger rubbing bilaterally. Uvula tongue midline.  head turning and shoulder shrug and were normal and symmetric.Tongue protrusion into cheek strength was normal.  Motor: she has prominent bilateral lower extremity edema, no movement of bilateral lower extremity, right upper extremity 4+, proximal and distally, upper extremity motor strength is normal  Sensory: Intact to fine touch, pinprick, preserved vibratory sensation, and proprioception at distal leg.  Coordination: Normal finger to nose, heel-to-shin bilaterally   Gait: deferred.  Deep tendon reflexes: Brachioradialis 2+/2+, biceps 2+/2+, triceps 2/2, patellar absent, Achilles absent, prominent bilateral lower extremity lymphatic edema.  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 She was on Betaseron treatment, but does not like the needle injection.  1. repeat MRI of the brain, and cervical spine, if there is no active lesion, no clinical progression, 2. May consider just observing patient clinically, provide supportive treatment, she may not benefit from disease modify therapy anymore

## 2012-10-05 ENCOUNTER — Ambulatory Visit
Admission: RE | Admit: 2012-10-05 | Discharge: 2012-10-05 | Disposition: A | Discharge status: Home / Self Care | Payer: PRIVATE HEALTH INSURANCE | Source: Ambulatory Visit | Attending: Neurology | Admitting: Neurology

## 2012-10-05 ENCOUNTER — Other Ambulatory Visit: Payer: Self-pay | Admitting: Neurology

## 2012-10-05 DIAGNOSIS — G35 Multiple sclerosis: Secondary | ICD-10-CM

## 2012-10-05 DIAGNOSIS — R609 Edema, unspecified: Secondary | ICD-10-CM

## 2012-10-05 DIAGNOSIS — I10 Essential (primary) hypertension: Secondary | ICD-10-CM

## 2012-10-05 DIAGNOSIS — R739 Hyperglycemia, unspecified: Secondary | ICD-10-CM

## 2012-10-10 ENCOUNTER — Telehealth: Payer: Self-pay | Admitting: Neurology

## 2012-10-10 NOTE — Telephone Encounter (Signed)
I have called her, abnormal MRI brain, cervical consistent with MS, She is  clinical stable for more than 5 years,   She has been off Beteseron since Jan 2014, dana, please give her a follow up in 3-4 months

## 2012-10-12 NOTE — Telephone Encounter (Signed)
Called patient and left message. Patient needs to follow up in 3-4 months.

## 2013-04-24 ENCOUNTER — Other Ambulatory Visit: Payer: Self-pay | Admitting: Internal Medicine

## 2013-04-24 DIAGNOSIS — Z1231 Encounter for screening mammogram for malignant neoplasm of breast: Secondary | ICD-10-CM

## 2013-05-29 ENCOUNTER — Ambulatory Visit: Payer: PRIVATE HEALTH INSURANCE

## 2013-09-18 ENCOUNTER — Ambulatory Visit
Admission: RE | Admit: 2013-09-18 | Discharge: 2013-09-18 | Disposition: A | Discharge status: Home / Self Care | Payer: PRIVATE HEALTH INSURANCE | Source: Ambulatory Visit | Attending: Internal Medicine | Admitting: Internal Medicine

## 2013-09-18 DIAGNOSIS — Z1231 Encounter for screening mammogram for malignant neoplasm of breast: Secondary | ICD-10-CM

## 2014-04-04 ENCOUNTER — Encounter (HOSPITAL_BASED_OUTPATIENT_CLINIC_OR_DEPARTMENT_OTHER): Payer: PRIVATE HEALTH INSURANCE | Attending: General Surgery

## 2014-04-04 DIAGNOSIS — L97929 Non-pressure chronic ulcer of unspecified part of left lower leg with unspecified severity: Secondary | ICD-10-CM | POA: Insufficient documentation

## 2014-04-04 DIAGNOSIS — E118 Type 2 diabetes mellitus with unspecified complications: Secondary | ICD-10-CM | POA: Diagnosis not present

## 2014-04-04 DIAGNOSIS — I10 Essential (primary) hypertension: Secondary | ICD-10-CM | POA: Insufficient documentation

## 2014-04-04 DIAGNOSIS — I87313 Chronic venous hypertension (idiopathic) with ulcer of bilateral lower extremity: Secondary | ICD-10-CM | POA: Insufficient documentation

## 2014-04-04 DIAGNOSIS — G35 Multiple sclerosis: Secondary | ICD-10-CM | POA: Diagnosis not present

## 2014-04-04 DIAGNOSIS — I890 Lymphedema, not elsewhere classified: Secondary | ICD-10-CM | POA: Insufficient documentation

## 2014-04-04 DIAGNOSIS — L97919 Non-pressure chronic ulcer of unspecified part of right lower leg with unspecified severity: Secondary | ICD-10-CM | POA: Diagnosis not present

## 2014-04-04 LAB — GLUCOSE, CAPILLARY: GLUCOSE-CAPILLARY: 173 mg/dL — AB (ref 70–99)

## 2014-04-05 NOTE — Progress Notes (Signed)
[facility]  NAME:  [name], [name]                ACCOUNT NO.:  [id number]  MEDICAL RECORD NO.:  [id number]      DATE OF BIRTH:  1960-08-23  PHYSICIAN:  Judene Companion, M.D.      VISIT DATE:  04/04/2014                                  OFFICE VISIT   This is a 53 year old lady who has many diagnoses including type 2 diabetes, multiple sclerosis, hypertension.  She is sent to [place] with bilateral lymphedema and leg ulcers.  On examination, she has multiple superficial ulcers on both of her legs, which I am saying are lower leg venous ulcers with inflammation and hypertension and venous insufficiency.  She comes to [place] today afebrile.  Blood pressure 125/66, respirations 20, pulse 80, blood sugar 173, she weighs 172 pounds.  Her diagnoses are hypertension, multiple sclerosis, peripheral leg edema with venous stasis, and type 2 diabetes.  Her medicines are albuterol, vitamins, Glucotrol, lisinopril, lovastatin, omega-3, and Zanaflex.  I am going to treat her with Unna boots, which will give her some medication and some compression and we will re-evaluate her in a week.     Judene Companion, M.D.     PP/MEDQ  D:  04/04/2014  T:  04/05/2014  Job:  [id number]

## 2014-04-11 DIAGNOSIS — I87313 Chronic venous hypertension (idiopathic) with ulcer of bilateral lower extremity: Secondary | ICD-10-CM | POA: Diagnosis not present

## 2014-04-11 DIAGNOSIS — G35 Multiple sclerosis: Secondary | ICD-10-CM | POA: Diagnosis not present

## 2014-04-11 DIAGNOSIS — L97919 Non-pressure chronic ulcer of unspecified part of right lower leg with unspecified severity: Secondary | ICD-10-CM | POA: Diagnosis not present

## 2014-04-11 DIAGNOSIS — L97929 Non-pressure chronic ulcer of unspecified part of left lower leg with unspecified severity: Secondary | ICD-10-CM | POA: Diagnosis not present

## 2014-04-18 ENCOUNTER — Encounter (HOSPITAL_BASED_OUTPATIENT_CLINIC_OR_DEPARTMENT_OTHER): Payer: PRIVATE HEALTH INSURANCE | Attending: General Surgery

## 2014-04-18 DIAGNOSIS — G35 Multiple sclerosis: Secondary | ICD-10-CM | POA: Insufficient documentation

## 2014-04-18 DIAGNOSIS — L97829 Non-pressure chronic ulcer of other part of left lower leg with unspecified severity: Secondary | ICD-10-CM | POA: Insufficient documentation

## 2014-04-18 DIAGNOSIS — L97329 Non-pressure chronic ulcer of left ankle with unspecified severity: Secondary | ICD-10-CM | POA: Insufficient documentation

## 2014-04-18 DIAGNOSIS — I890 Lymphedema, not elsewhere classified: Secondary | ICD-10-CM | POA: Diagnosis not present

## 2014-04-18 DIAGNOSIS — L97819 Non-pressure chronic ulcer of other part of right lower leg with unspecified severity: Secondary | ICD-10-CM | POA: Diagnosis not present

## 2014-04-25 DIAGNOSIS — L97329 Non-pressure chronic ulcer of left ankle with unspecified severity: Secondary | ICD-10-CM | POA: Diagnosis not present

## 2014-04-25 DIAGNOSIS — L97819 Non-pressure chronic ulcer of other part of right lower leg with unspecified severity: Secondary | ICD-10-CM | POA: Diagnosis not present

## 2014-04-25 DIAGNOSIS — L97829 Non-pressure chronic ulcer of other part of left lower leg with unspecified severity: Secondary | ICD-10-CM | POA: Diagnosis not present

## 2014-04-25 DIAGNOSIS — I890 Lymphedema, not elsewhere classified: Secondary | ICD-10-CM | POA: Diagnosis not present

## 2014-05-02 DIAGNOSIS — L97819 Non-pressure chronic ulcer of other part of right lower leg with unspecified severity: Secondary | ICD-10-CM | POA: Diagnosis not present

## 2014-05-02 DIAGNOSIS — L97829 Non-pressure chronic ulcer of other part of left lower leg with unspecified severity: Secondary | ICD-10-CM | POA: Diagnosis not present

## 2014-05-02 DIAGNOSIS — I890 Lymphedema, not elsewhere classified: Secondary | ICD-10-CM | POA: Diagnosis not present

## 2014-05-02 DIAGNOSIS — L97329 Non-pressure chronic ulcer of left ankle with unspecified severity: Secondary | ICD-10-CM | POA: Diagnosis not present

## 2014-05-02 LAB — GLUCOSE, CAPILLARY: Glucose-Capillary: 185 mg/dL — ABNORMAL HIGH (ref 70–99)

## 2014-05-14 ENCOUNTER — Encounter (HOSPITAL_BASED_OUTPATIENT_CLINIC_OR_DEPARTMENT_OTHER): Payer: PRIVATE HEALTH INSURANCE | Attending: General Surgery

## 2014-05-14 DIAGNOSIS — L97929 Non-pressure chronic ulcer of unspecified part of left lower leg with unspecified severity: Secondary | ICD-10-CM | POA: Insufficient documentation

## 2014-05-14 DIAGNOSIS — I87332 Chronic venous hypertension (idiopathic) with ulcer and inflammation of left lower extremity: Secondary | ICD-10-CM | POA: Insufficient documentation

## 2014-05-14 DIAGNOSIS — L97329 Non-pressure chronic ulcer of left ankle with unspecified severity: Secondary | ICD-10-CM | POA: Insufficient documentation

## 2014-05-21 DIAGNOSIS — L97929 Non-pressure chronic ulcer of unspecified part of left lower leg with unspecified severity: Secondary | ICD-10-CM | POA: Diagnosis not present

## 2014-05-21 DIAGNOSIS — I87332 Chronic venous hypertension (idiopathic) with ulcer and inflammation of left lower extremity: Secondary | ICD-10-CM | POA: Diagnosis not present

## 2014-05-21 DIAGNOSIS — L97329 Non-pressure chronic ulcer of left ankle with unspecified severity: Secondary | ICD-10-CM | POA: Diagnosis not present

## 2014-05-28 DIAGNOSIS — L97329 Non-pressure chronic ulcer of left ankle with unspecified severity: Secondary | ICD-10-CM | POA: Diagnosis not present

## 2014-05-28 DIAGNOSIS — L97929 Non-pressure chronic ulcer of unspecified part of left lower leg with unspecified severity: Secondary | ICD-10-CM | POA: Diagnosis not present

## 2014-05-28 DIAGNOSIS — I87332 Chronic venous hypertension (idiopathic) with ulcer and inflammation of left lower extremity: Secondary | ICD-10-CM | POA: Diagnosis not present

## 2014-06-11 DIAGNOSIS — I87332 Chronic venous hypertension (idiopathic) with ulcer and inflammation of left lower extremity: Secondary | ICD-10-CM | POA: Diagnosis not present

## 2014-06-11 DIAGNOSIS — L97329 Non-pressure chronic ulcer of left ankle with unspecified severity: Secondary | ICD-10-CM | POA: Diagnosis not present

## 2014-06-11 DIAGNOSIS — L97929 Non-pressure chronic ulcer of unspecified part of left lower leg with unspecified severity: Secondary | ICD-10-CM | POA: Diagnosis not present

## 2014-06-25 ENCOUNTER — Encounter (HOSPITAL_BASED_OUTPATIENT_CLINIC_OR_DEPARTMENT_OTHER): Payer: Medicare Other | Attending: General Surgery

## 2014-06-25 DIAGNOSIS — L97929 Non-pressure chronic ulcer of unspecified part of left lower leg with unspecified severity: Secondary | ICD-10-CM | POA: Insufficient documentation

## 2014-06-25 DIAGNOSIS — I87333 Chronic venous hypertension (idiopathic) with ulcer and inflammation of bilateral lower extremity: Secondary | ICD-10-CM | POA: Diagnosis not present

## 2014-06-25 DIAGNOSIS — L97329 Non-pressure chronic ulcer of left ankle with unspecified severity: Secondary | ICD-10-CM | POA: Diagnosis not present

## 2014-06-25 DIAGNOSIS — L97319 Non-pressure chronic ulcer of right ankle with unspecified severity: Secondary | ICD-10-CM | POA: Diagnosis not present

## 2014-07-09 DIAGNOSIS — I87333 Chronic venous hypertension (idiopathic) with ulcer and inflammation of bilateral lower extremity: Secondary | ICD-10-CM | POA: Diagnosis not present

## 2014-07-10 NOTE — H&P (Signed)
NAME:  HAADIYA, FROGGE                     ACCOUNT NO.:  MEDICAL RECORD NO.:  [id number]  LOCATION:                                 FACILITY:  PHYSICIAN:  Christin Fudge, MD       DATE OF BIRTH:  1961/06/08  DATE OF ADMISSION: DATE OF DISCHARGE:                             HISTORY & PHYSICAL   OFFICE VISIT NOTE  This 54 year old unfortunate lady has a diagnosis of type 2 diabetes mellitus, multiple sclerosis, hypertension, and bilateral lymphedema of both lower extremities and feet.  CHIEF COMPLAINT:  Multiple ulcerations on the lower extremities, especially on the left ankle, right ankle, and left lower extremity.  SUBJECTIVE:  She said she is feeling better and the pain persists in the left ankle and left lower extremity.  OBJECTIVE:  She is wheel chair bound and unable to move her lower extremities and she has massive lymphedema and nonpitting edema of the feet.  This is very thickened skin and starts at her ankles and goes down to her toes.  The rest of her lower extremities are much better. She has a cluster of ulcers on the right lower extremity near the ankle and on the left ankle and left lower extremity.  Size has been noted in the nursing chart.  FINDINGS:  I have examined the above ulcers and dimensions and noted that there is no information.  ASSESSMENT/PLAN:  We will apply silver alginate and Profore layer.  The wrap is to start somewhere near the toes and go up to her legs below the knee.  She is also encouraged to use lymphedema pumps at home and do this twice a day.  I have encouraged to raise her legs as much as possible during the day.  She is agreeable to come back next week, and we will review her appropriately.          ______________________________ Christin Fudge, MD     EB/MEDQ  D:  07/09/2014  T:  07/10/2014  Job:  [id number]  cc:   Wound Care Office

## 2014-07-16 ENCOUNTER — Encounter (HOSPITAL_BASED_OUTPATIENT_CLINIC_OR_DEPARTMENT_OTHER): Payer: Medicare Other | Attending: Surgery

## 2014-07-16 DIAGNOSIS — L97329 Non-pressure chronic ulcer of left ankle with unspecified severity: Secondary | ICD-10-CM | POA: Insufficient documentation

## 2014-07-16 DIAGNOSIS — L97929 Non-pressure chronic ulcer of unspecified part of left lower leg with unspecified severity: Secondary | ICD-10-CM | POA: Diagnosis not present

## 2014-07-16 DIAGNOSIS — L97319 Non-pressure chronic ulcer of right ankle with unspecified severity: Secondary | ICD-10-CM | POA: Diagnosis not present

## 2014-07-16 NOTE — H&P (Signed)
[name], [name]                ACCOUNT NO.:  [id number]  MEDICAL RECORD NO.:  [id number]  LOCATION:  FOOT                         FACILITY:  [facility]  PHYSICIAN:  Christin Fudge, MD       DATE OF BIRTH:  1960-11-10  DATE OF ADMISSION:  07/16/2014 DATE OF DISCHARGE:                             HISTORY & PHYSICAL   This 54 year old patient comes with chief complaints of having multiple ulcerations on the left lower extremity, left ankle, right ankle for several weeks.  HPI:  The patient says she is getting worse and there is a lot of discharge from the left lower extremity.  She says the pain persists and the pain is more on the left ankle than the right ankle.  Other than that, I have reviewed her H and P and nothing else has changed.  On clinical examination, she is wheelchair bound, awake, and alert in some distress due to her pain.  Vital signs are stable.  She is afebrile. Examination of both lower extremities reveal that she has a cluster of ulcerations on the left ankle area which on lower extremities 2.8 x 2.2 x 0.2.  On the left ankle, it is 4.2 x 5.7 x 0.3.  On the right ankle, it is 0.9 x 1.8 x 0.1 cm.  On the left medial part of her foot, there is a new ulceration which is 1.5 x 0.4 x 0.1.  There is no foul odor, but there is moderate amount of exudate and discharge on the left ankle and left lower extremity have been cultured.  Other than that, on clinical examination, I have applied mild pressure and tried to get some of the fluid from under the pocket out.  She is rather tender.  ASSESSMENT AND PLAN:  We will continue to apply silver alginate and Profore Lite dressing.  As noted before, we will try and apply the dressing starting at the toes and going up towards her legs.  We will do this dressing today and ask the home health to come to other time so that she will have alternative dressings.  We will apply some extra pads to soak the drainage and await her culture  report to start her on some antibiotics.  I have also encouraged her to use the lymphedema pumps at home and do it twice a day if possible.  She understands the treatment plan and will come back to see [place] next week.          ______________________________ Christin Fudge, MD     EB/MEDQ  D:  07/16/2014  T:  07/16/2014  Job:  [id number]  cc:   [place]

## 2014-07-23 DIAGNOSIS — L97329 Non-pressure chronic ulcer of left ankle with unspecified severity: Secondary | ICD-10-CM | POA: Diagnosis not present

## 2014-07-24 NOTE — H&P (Signed)
[name], [name]                ACCOUNT NO.:  [id number]  MEDICAL RECORD NO.:  [id number]  LOCATION:  FOOT                         FACILITY:  [facility]  PHYSICIAN:  Christin Fudge, MD       DATE OF BIRTH:  01-07-1961  DATE OF ADMISSION:  07/16/2014 DATE OF DISCHARGE:                             HISTORY & PHYSICAL   HISTORY OF PRESENT ILLNESS:  This 54 year old patient comes with a chief complaint of having multiple ulceration on lower extremities the left side being more than the right side.  On the left side, she has it all along her left lower extremity and her left ankle and on the right side, it is only on the right ankle.  She has had this for several weeks.  The patient says she is getting better and has been on antibiotics for about a week and she has been on Septra.  Other than that, she continues to have no issues except for mild discomfort when she is touched.  PHYSICAL EXAMINATION:  On clinical examination, she is awake and alert, not in any acute distress, wheelchair bound, 5 feet, 5 inches in height, 172 pounds.  Her temperature is 99 degrees Fahrenheit, pulse is 96, respirations 20, blood pressure is 124/73.  On examination of her right ankle, she has a cluster of openings which are about 1.0 x 1.4 x 0.1 cm in size.  There is no infection and she seems to be doing fine.  On her left ankle, the ulceration is 4.6 x 7.0 x 0.1 and there is no evidence of any pus.  Her left medial lower extremity shows ulceration of 3.4 x 3.1 x 0.1 and again there is no pus.  She also has on the posterior part of her leg 1.5 x 0.6 x 0.1 and again there is no purulence.  All these are fairly clean, and I believe are much better than last week.  ASSESSMENT AND PLAN:  We will continue to apply silver alginate and Profore Lite dressing.  In view of the fact that her antibiotics are getting over this week and looking at her culture report, I have recommended that we give her doxycycline 100 mg  twice a day for the next 2 weeks.  She is also encouraged to use her lymphedema pumps.  She will come back and see [place] next week.          ______________________________ Christin Fudge, MD     EB/MEDQ  D:  07/23/2014  T:  07/24/2014  Job:  [id number]  cc:   Wound Care Office

## 2014-07-30 DIAGNOSIS — L97329 Non-pressure chronic ulcer of left ankle with unspecified severity: Secondary | ICD-10-CM | POA: Diagnosis not present

## 2014-07-31 NOTE — Progress Notes (Signed)
NAME:  [name], [name]                ACCOUNT NO.:  [id number]  MEDICAL RECORD NO.:  [id number]  LOCATION:  FOOT                         FACILITY:  [facility]  PHYSICIAN:  Christin Fudge, MD       DATE OF BIRTH:  [birth year]                                PROGRESS NOTE   SUBJECTIVE: This 54 year old patient, who has multiple sclerosis and is wheelchair bound, comes with multiple ulcerations of the lower extremities, left one being much more than the right one.  Overall, she is doing much better and she has just been finishing her Septra and is going to start off on doxycycline which we have prescribed for her for 2 weeks. Overall, she seems to be doing much better and she has no fresh complaints.  OBJECTIVE: GENERAL:  On clinical examination, she is poorly nourished and wheelchair-bound and looks like she is comfortable. VITAL SIGNS:  Her height is 5 feet 5 inches, weight is 172 pounds, and she has a blood pressure 120/70 today, respirations of 20, pulse of 93, and temperature of 97.8. EXTREMITIES:  On examination of her lower extremities, she has an area on the right ankle which is looking much better as compared to what it was before.  She also has a small area on the right calf which needs no debridement.  There is no pus or fluid coming out of these.  The right ankle got a cluster of about 1.0 x 1.4  x 0.1.  On the left ankle, there is a cluster of about 4.6 x 7.0 x 0.1 and there is no pus.  Other than that, the overall edema of her limbs seemed to be much better.  I have noted all the other ulcerations as noted in the wound care assessment chart dated July 30, 2014.  ASSESSMENT AND PLAN: We will continue to apply the silver alginate and a Profore Lite dressing and note is being made that she will start the doxycycline 100 mg twice daily for the next 2 weeks.  She is also encouraged to use lymphedema pumps and overall we are pleased with her progress.  She will come back and see [place]  next week.          ______________________________ Christin Fudge, MD     EB/MEDQ  D:  07/30/2014  T:  07/31/2014  Job:  [id number]  cc:   Surgical office

## 2014-08-13 ENCOUNTER — Encounter (HOSPITAL_BASED_OUTPATIENT_CLINIC_OR_DEPARTMENT_OTHER): Payer: Medicare Other | Attending: Surgery

## 2014-08-13 DIAGNOSIS — L97811 Non-pressure chronic ulcer of other part of right lower leg limited to breakdown of skin: Secondary | ICD-10-CM | POA: Diagnosis not present

## 2014-08-13 DIAGNOSIS — I87333 Chronic venous hypertension (idiopathic) with ulcer and inflammation of bilateral lower extremity: Secondary | ICD-10-CM | POA: Diagnosis not present

## 2014-08-13 DIAGNOSIS — L97821 Non-pressure chronic ulcer of other part of left lower leg limited to breakdown of skin: Secondary | ICD-10-CM | POA: Insufficient documentation

## 2014-08-13 DIAGNOSIS — E11622 Type 2 diabetes mellitus with other skin ulcer: Secondary | ICD-10-CM | POA: Insufficient documentation

## 2014-08-13 DIAGNOSIS — I890 Lymphedema, not elsewhere classified: Secondary | ICD-10-CM | POA: Insufficient documentation

## 2014-08-27 DIAGNOSIS — I87333 Chronic venous hypertension (idiopathic) with ulcer and inflammation of bilateral lower extremity: Secondary | ICD-10-CM | POA: Diagnosis not present

## 2014-09-03 DIAGNOSIS — I87333 Chronic venous hypertension (idiopathic) with ulcer and inflammation of bilateral lower extremity: Secondary | ICD-10-CM | POA: Diagnosis not present

## 2014-09-10 DIAGNOSIS — I87333 Chronic venous hypertension (idiopathic) with ulcer and inflammation of bilateral lower extremity: Secondary | ICD-10-CM | POA: Diagnosis not present

## 2014-09-17 ENCOUNTER — Encounter (HOSPITAL_BASED_OUTPATIENT_CLINIC_OR_DEPARTMENT_OTHER): Payer: Medicare Other | Attending: Surgery

## 2014-09-17 DIAGNOSIS — I87333 Chronic venous hypertension (idiopathic) with ulcer and inflammation of bilateral lower extremity: Secondary | ICD-10-CM | POA: Diagnosis not present

## 2014-09-17 DIAGNOSIS — I890 Lymphedema, not elsewhere classified: Secondary | ICD-10-CM | POA: Diagnosis not present

## 2014-09-17 DIAGNOSIS — L97911 Non-pressure chronic ulcer of unspecified part of right lower leg limited to breakdown of skin: Secondary | ICD-10-CM | POA: Diagnosis present

## 2014-09-17 DIAGNOSIS — G35 Multiple sclerosis: Secondary | ICD-10-CM | POA: Insufficient documentation

## 2014-09-17 DIAGNOSIS — E119 Type 2 diabetes mellitus without complications: Secondary | ICD-10-CM | POA: Insufficient documentation

## 2014-09-17 DIAGNOSIS — L97221 Non-pressure chronic ulcer of left calf limited to breakdown of skin: Secondary | ICD-10-CM | POA: Diagnosis present

## 2014-09-17 DIAGNOSIS — Z993 Dependence on wheelchair: Secondary | ICD-10-CM | POA: Insufficient documentation

## 2014-09-24 DIAGNOSIS — I87333 Chronic venous hypertension (idiopathic) with ulcer and inflammation of bilateral lower extremity: Secondary | ICD-10-CM | POA: Diagnosis not present

## 2014-10-01 DIAGNOSIS — I87333 Chronic venous hypertension (idiopathic) with ulcer and inflammation of bilateral lower extremity: Secondary | ICD-10-CM | POA: Diagnosis not present

## 2014-10-08 DIAGNOSIS — I87333 Chronic venous hypertension (idiopathic) with ulcer and inflammation of bilateral lower extremity: Secondary | ICD-10-CM | POA: Diagnosis not present

## 2014-12-13 ENCOUNTER — Emergency Department (HOSPITAL_COMMUNITY): Payer: Medicare Other

## 2014-12-13 ENCOUNTER — Encounter (HOSPITAL_COMMUNITY): Payer: Self-pay | Admitting: Emergency Medicine

## 2014-12-13 ENCOUNTER — Inpatient Hospital Stay (HOSPITAL_COMMUNITY)
Admission: EM | Admit: 2014-12-13 | Discharge: 2014-12-21 | DRG: 871 | Disposition: A | Discharge status: Skilled Nursing Facility | Payer: Medicare Other | Attending: Internal Medicine | Admitting: Internal Medicine

## 2014-12-13 DIAGNOSIS — E875 Hyperkalemia: Secondary | ICD-10-CM | POA: Diagnosis present

## 2014-12-13 DIAGNOSIS — Z87891 Personal history of nicotine dependence: Secondary | ICD-10-CM

## 2014-12-13 DIAGNOSIS — I890 Lymphedema, not elsewhere classified: Secondary | ICD-10-CM | POA: Diagnosis present

## 2014-12-13 DIAGNOSIS — G9341 Metabolic encephalopathy: Secondary | ICD-10-CM | POA: Diagnosis present

## 2014-12-13 DIAGNOSIS — E2740 Unspecified adrenocortical insufficiency: Secondary | ICD-10-CM | POA: Diagnosis present

## 2014-12-13 DIAGNOSIS — L8990 Pressure ulcer of unspecified site, unspecified stage: Secondary | ICD-10-CM | POA: Diagnosis present

## 2014-12-13 DIAGNOSIS — Z993 Dependence on wheelchair: Secondary | ICD-10-CM

## 2014-12-13 DIAGNOSIS — Z7982 Long term (current) use of aspirin: Secondary | ICD-10-CM

## 2014-12-13 DIAGNOSIS — Z91128 Patient's intentional underdosing of medication regimen for other reason: Secondary | ICD-10-CM | POA: Diagnosis present

## 2014-12-13 DIAGNOSIS — N12 Tubulo-interstitial nephritis, not specified as acute or chronic: Secondary | ICD-10-CM

## 2014-12-13 DIAGNOSIS — N319 Neuromuscular dysfunction of bladder, unspecified: Secondary | ICD-10-CM | POA: Diagnosis present

## 2014-12-13 DIAGNOSIS — E119 Type 2 diabetes mellitus without complications: Secondary | ICD-10-CM | POA: Diagnosis present

## 2014-12-13 DIAGNOSIS — N10 Acute tubulo-interstitial nephritis: Secondary | ICD-10-CM | POA: Diagnosis not present

## 2014-12-13 DIAGNOSIS — N170 Acute kidney failure with tubular necrosis: Secondary | ICD-10-CM | POA: Diagnosis present

## 2014-12-13 DIAGNOSIS — R159 Full incontinence of feces: Secondary | ICD-10-CM | POA: Diagnosis present

## 2014-12-13 DIAGNOSIS — I10 Essential (primary) hypertension: Secondary | ICD-10-CM | POA: Diagnosis present

## 2014-12-13 DIAGNOSIS — N202 Calculus of kidney with calculus of ureter: Secondary | ICD-10-CM | POA: Diagnosis present

## 2014-12-13 DIAGNOSIS — Z79899 Other long term (current) drug therapy: Secondary | ICD-10-CM

## 2014-12-13 DIAGNOSIS — R630 Anorexia: Secondary | ICD-10-CM

## 2014-12-13 DIAGNOSIS — A419 Sepsis, unspecified organism: Principal | ICD-10-CM

## 2014-12-13 DIAGNOSIS — I82411 Acute embolism and thrombosis of right femoral vein: Secondary | ICD-10-CM | POA: Diagnosis present

## 2014-12-13 DIAGNOSIS — R6521 Severe sepsis with septic shock: Secondary | ICD-10-CM | POA: Diagnosis not present

## 2014-12-13 DIAGNOSIS — R441 Visual hallucinations: Secondary | ICD-10-CM | POA: Diagnosis not present

## 2014-12-13 DIAGNOSIS — R627 Adult failure to thrive: Secondary | ICD-10-CM

## 2014-12-13 DIAGNOSIS — G9340 Encephalopathy, unspecified: Secondary | ICD-10-CM | POA: Diagnosis not present

## 2014-12-13 DIAGNOSIS — R6520 Severe sepsis without septic shock: Secondary | ICD-10-CM | POA: Diagnosis not present

## 2014-12-13 DIAGNOSIS — E872 Acidosis: Secondary | ICD-10-CM | POA: Diagnosis present

## 2014-12-13 DIAGNOSIS — N200 Calculus of kidney: Secondary | ICD-10-CM | POA: Diagnosis not present

## 2014-12-13 DIAGNOSIS — B964 Proteus (mirabilis) (morganii) as the cause of diseases classified elsewhere: Secondary | ICD-10-CM | POA: Diagnosis present

## 2014-12-13 DIAGNOSIS — R740 Nonspecific elevation of levels of transaminase and lactic acid dehydrogenase [LDH]: Secondary | ICD-10-CM | POA: Diagnosis not present

## 2014-12-13 DIAGNOSIS — G35 Multiple sclerosis: Secondary | ICD-10-CM | POA: Diagnosis present

## 2014-12-13 DIAGNOSIS — E876 Hypokalemia: Secondary | ICD-10-CM | POA: Diagnosis not present

## 2014-12-13 DIAGNOSIS — I959 Hypotension, unspecified: Secondary | ICD-10-CM

## 2014-12-13 DIAGNOSIS — N179 Acute kidney failure, unspecified: Secondary | ICD-10-CM | POA: Diagnosis not present

## 2014-12-13 DIAGNOSIS — Z9189 Other specified personal risk factors, not elsewhere classified: Secondary | ICD-10-CM | POA: Diagnosis not present

## 2014-12-13 DIAGNOSIS — D649 Anemia, unspecified: Secondary | ICD-10-CM | POA: Diagnosis not present

## 2014-12-13 DIAGNOSIS — Z452 Encounter for adjustment and management of vascular access device: Secondary | ICD-10-CM

## 2014-12-13 HISTORY — DX: Type 2 diabetes mellitus without complications: E11.9

## 2014-12-13 LAB — CBC WITH DIFFERENTIAL/PLATELET
BASOS ABS: 0.2 10*3/uL — AB (ref 0.0–0.1)
BASOS PCT: 1 % (ref 0–1)
EOS ABS: 0.4 10*3/uL (ref 0.0–0.7)
Eosinophils Relative: 2 % (ref 0–5)
HEMATOCRIT: 35.5 % — AB (ref 36.0–46.0)
HEMOGLOBIN: 12.2 g/dL (ref 12.0–15.0)
LYMPHS PCT: 13 % (ref 12–46)
Lymphs Abs: 2.3 10*3/uL (ref 0.7–4.0)
MCH: 29.8 pg (ref 26.0–34.0)
MCHC: 34.4 g/dL (ref 30.0–36.0)
MCV: 86.6 fL (ref 78.0–100.0)
MONO ABS: 0.7 10*3/uL (ref 0.1–1.0)
Monocytes Relative: 4 % (ref 3–12)
NEUTROS ABS: 14.4 10*3/uL — AB (ref 1.7–7.7)
NEUTROS PCT: 80 % — AB (ref 43–77)
PLATELETS: 321 10*3/uL (ref 150–400)
RBC: 4.1 MIL/uL (ref 3.87–5.11)
RDW: 14.8 % (ref 11.5–15.5)
WBC: 18 10*3/uL — ABNORMAL HIGH (ref 4.0–10.5)

## 2014-12-13 LAB — MAGNESIUM: MAGNESIUM: 2.2 mg/dL (ref 1.7–2.4)

## 2014-12-13 LAB — URINALYSIS, ROUTINE W REFLEX MICROSCOPIC
Bilirubin Urine: NEGATIVE
Glucose, UA: NEGATIVE mg/dL
Ketones, ur: NEGATIVE mg/dL
Nitrite: POSITIVE — AB
PH: 8 (ref 5.0–8.0)
Protein, ur: 100 mg/dL — AB
Specific Gravity, Urine: 1.011 (ref 1.005–1.030)
Urobilinogen, UA: 0.2 mg/dL (ref 0.0–1.0)

## 2014-12-13 LAB — COMPREHENSIVE METABOLIC PANEL
ALBUMIN: 3.1 g/dL — AB (ref 3.5–5.0)
ALK PHOS: 73 U/L (ref 38–126)
ALT: 12 U/L — ABNORMAL LOW (ref 14–54)
AST: 13 U/L — ABNORMAL LOW (ref 15–41)
Anion gap: 15 (ref 5–15)
BUN: 118 mg/dL — AB (ref 6–20)
CO2: 18 mmol/L — AB (ref 22–32)
Calcium: 10.6 mg/dL — ABNORMAL HIGH (ref 8.9–10.3)
Chloride: 108 mmol/L (ref 101–111)
Creatinine, Ser: 4.74 mg/dL — ABNORMAL HIGH (ref 0.44–1.00)
GFR calc non Af Amer: 9 mL/min — ABNORMAL LOW (ref >60)
GFR, EST AFRICAN AMERICAN: 11 mL/min — AB (ref >60)
GLUCOSE: 74 mg/dL (ref 65–99)
Potassium: 5.7 mmol/L — ABNORMAL HIGH (ref 3.5–5.1)
Sodium: 141 mmol/L (ref 135–145)
TOTAL PROTEIN: 9 g/dL — AB (ref 6.5–8.1)
Total Bilirubin: 0.6 mg/dL (ref 0.3–1.2)

## 2014-12-13 LAB — URINE MICROSCOPIC-ADD ON

## 2014-12-13 LAB — I-STAT TROPONIN, ED: TROPONIN I, POC: 0.03 ng/mL (ref 0.00–0.08)

## 2014-12-13 LAB — PHOSPHORUS: Phosphorus: 5.6 mg/dL — ABNORMAL HIGH (ref 2.5–4.6)

## 2014-12-13 LAB — TROPONIN I

## 2014-12-13 LAB — LACTIC ACID, PLASMA
LACTIC ACID, VENOUS: 2.4 mmol/L — AB (ref 0.5–2.0)
Lactic Acid, Venous: 1.9 mmol/L (ref 0.5–2.0)

## 2014-12-13 LAB — PROCALCITONIN: Procalcitonin: 5.37 ng/mL

## 2014-12-13 LAB — CBG MONITORING, ED: Glucose-Capillary: 72 mg/dL (ref 65–99)

## 2014-12-13 LAB — BRAIN NATRIURETIC PEPTIDE: B NATRIURETIC PEPTIDE 5: 56.9 pg/mL (ref 0.0–100.0)

## 2014-12-13 MED ORDER — ASPIRIN EC 81 MG PO TBEC
81.0000 mg | DELAYED_RELEASE_TABLET | Freq: Every day | ORAL | Status: DC
  Administered 2014-12-13 – 2014-12-21 (×9): 81 mg via ORAL
  Filled 2014-12-13 (×9): qty 1

## 2014-12-13 MED ORDER — SODIUM POLYSTYRENE SULFONATE 15 GM/60ML PO SUSP: 30.0000 g | Freq: Once | ORAL | Status: DC

## 2014-12-13 MED ORDER — SODIUM CHLORIDE 0.9 % IJ SOLN
3.0000 mL | Freq: Two times a day (BID) | INTRAMUSCULAR | Status: DC
  Administered 2014-12-14 (×2): 3 mL via INTRAVENOUS
  Administered 2014-12-15: 22:00:00 via INTRAVENOUS
  Administered 2014-12-15 – 2014-12-21 (×6): 3 mL via INTRAVENOUS

## 2014-12-13 MED ORDER — SODIUM CHLORIDE 0.9 % IV BOLUS (SEPSIS)
1000.0000 mL | INTRAVENOUS | Status: AC
  Administered 2014-12-13 – 2014-12-14 (×3): 1000 mL via INTRAVENOUS

## 2014-12-13 MED ORDER — SODIUM CHLORIDE 0.9 % IV BOLUS (SEPSIS): 500.0000 mL | Freq: Once | INTRAVENOUS | Status: DC

## 2014-12-13 MED ORDER — HEPARIN SODIUM (PORCINE) 5000 UNIT/ML IJ SOLN
5000.0000 [IU] | Freq: Three times a day (TID) | INTRAMUSCULAR | Status: DC
  Administered 2014-12-14 – 2014-12-15 (×6): 5000 [IU] via SUBCUTANEOUS
  Filled 2014-12-13 (×8): qty 1

## 2014-12-13 MED ORDER — SODIUM CHLORIDE 0.9 % IV SOLN
INTRAVENOUS | Status: DC
  Administered 2014-12-13: 22:00:00 via INTRAVENOUS

## 2014-12-13 MED ORDER — SODIUM CHLORIDE 0.9 % IV SOLN: Freq: Once | INTRAVENOUS | Status: DC

## 2014-12-13 MED ORDER — CEFTRIAXONE SODIUM IN DEXTROSE 40 MG/ML IV SOLN: 2.0000 g | INTRAVENOUS | Status: DC

## 2014-12-13 MED ORDER — CEFTRIAXONE SODIUM IN DEXTROSE 40 MG/ML IV SOLN
2.0000 g | Freq: Once | INTRAVENOUS | Status: AC
  Administered 2014-12-13: 2 g via INTRAVENOUS
  Filled 2014-12-13: qty 50

## 2014-12-13 MED ORDER — BUPROPION HCL ER (SR) 150 MG PO TB12
150.0000 mg | ORAL_TABLET | Freq: Two times a day (BID) | ORAL | Status: DC
  Administered 2014-12-14 – 2014-12-15 (×4): 150 mg via ORAL
  Filled 2014-12-13 (×5): qty 1

## 2014-12-13 MED ORDER — SODIUM CHLORIDE 0.9 % IV BOLUS (SEPSIS)
500.0000 mL | Freq: Once | INTRAVENOUS | Status: AC
  Administered 2014-12-13: 500 mL via INTRAVENOUS

## 2014-12-13 NOTE — ED Notes (Signed)
Report of urine called to [facility], RN on [facility]

## 2014-12-13 NOTE — ED Notes (Signed)
EMS gave pt 521ml bolus NS enroute

## 2014-12-13 NOTE — ED Notes (Signed)
CBG 72  

## 2014-12-13 NOTE — ED Provider Notes (Signed)
CSN: [id number]     Arrival date & time 12/13/14  1514 History   First MD Initiated Contact with Patient 12/13/14 1514     Chief Complaint  Patient presents with  . Hypotension   (Consider location/radiation/quality/duration/timing/severity/associated sxs/prior Treatment) Patient is a 54 y.o. female presenting with altered mental status. The history is provided by the EMS personnel. The history is limited by the condition of the patient. No language interpreter was used.  Altered Mental Status Presenting symptoms: confusion and lethargy   Severity:  Moderate Most recent episode: last normal 1 week ago. Episode history:  Continuous Timing:  Constant Progression:  Worsening Chronicity:  New Context: not alcohol use, not dementia, not homeless and not a recent change in medication   Associated symptoms: bladder incontinence (chronic neurogenic bladder) and depression   Associated symptoms: no abdominal pain, normal movement, no fever, no nausea, no vomiting and no weakness     Past Medical History  Diagnosis Date  . MS (multiple sclerosis)   . Hyperglycemia   . Edema   . High blood pressure    Past Surgical History  Procedure Laterality Date  . Hernia mesh removal    . Tubes tided     Family History  Problem Relation Age of Onset  . Diabetes Mother   . Diabetes Father   . Diabetes Sister   . Scoliosis Sister   . Alzheimer's disease Mother    History  Substance Use Topics  . Smoking status: Former Smoker -- 1.00 packs/day for 20 years    Types: Cigarettes    Quit date: 06/25/2012  . Smokeless tobacco: Not on file  . Alcohol Use: 3.0 oz/week    5 Glasses of wine per week   OB History    No data available     Review of Systems  Constitutional: Positive for activity change, appetite change and fatigue. Negative for fever.  Respiratory: Negative for cough, chest tightness and shortness of breath.   Cardiovascular: Negative for chest pain.  Gastrointestinal: Negative  for nausea, vomiting and abdominal pain.  Genitourinary: Positive for bladder incontinence (chronic neurogenic bladder).  Neurological: Negative for weakness.  Psychiatric/Behavioral: Positive for confusion.  All other systems reviewed and are negative.     Allergies  Review of patient's allergies indicates no known allergies.  Home Medications   Prior to Admission medications   Medication Sig Start Date End Date Taking? Authorizing Provider  albuterol (PROVENTIL HFA;VENTOLIN HFA) 108 (90 BASE) MCG/ACT inhaler Inhale 2 puffs into the lungs every 4 (four) hours as needed for wheezing or shortness of breath. 06/29/12  Yes [name], MD  baclofen (LIORESAL) 10 MG tablet Take 10 mg by mouth 3 (three) times daily.    Yes Historical Provider, MD  buPROPion (WELLBUTRIN SR) 150 MG 12 hr tablet Take 150 mg by mouth 2 (two) times daily. 11/21/14  Yes Historical Provider, MD  doxycycline (VIBRAMYCIN) 100 MG capsule Take 100 mg by mouth 2 (two) times daily. 09/24/14  Yes Historical Provider, MD  furosemide (LASIX) 40 MG tablet Take 1 tablet (40 mg total) by mouth daily. Patient taking differently: Take 40 mg by mouth 3 (three) times daily.  06/29/12  Yes [name], MD  glipiZIDE (GLUCOTROL) 5 MG tablet Take 5 mg by mouth 2 (two) times daily. 12/08/14  Yes Historical Provider, MD  lisinopril (PRINIVIL,ZESTRIL) 2.5 MG tablet Take 2.5 mg by mouth daily. 12/05/14  Yes Historical Provider, MD  lovastatin (MEVACOR) 20 MG tablet Take 20 mg by mouth  daily.   Yes Historical Provider, MD  metFORMIN (GLUCOPHAGE) 500 MG tablet Take 500 mg by mouth 2 (two) times daily. 12/08/14  Yes Historical Provider, MD  solifenacin (VESICARE) 5 MG tablet Take 5 mg by mouth daily.   Yes Historical Provider, MD  tiZANidine (ZANAFLEX) 4 MG tablet Take 4 mg by mouth 3 (three) times daily.    Yes Historical Provider, MD  ferrous sulfate 325 (65 FE) MG tablet Take 1 tablet (325 mg total) by mouth 2 (two) times daily. Patient not  taking: Reported on 12/13/2014 06/29/12   [name], MD   BP 100/53 mmHg  Pulse 109  Temp(Src) 98 F (36.7 C) (Oral)  Resp 23  SpO2 100%   Physical Exam  Constitutional: She appears well-developed and well-nourished. She has a sickly appearance.  HENT:  Head: Normocephalic and atraumatic.  Nose: Nose normal.  Mouth/Throat: Oropharynx is clear and moist. No oropharyngeal exudate.  Eyes: EOM are normal. Pupils are equal, round, and reactive to light.  Neck: Normal range of motion. Neck supple.  Cardiovascular: Regular rhythm, normal heart sounds and intact distal pulses.  Tachycardia present.   No murmur heard. Pulmonary/Chest: Effort normal. Tachypnea noted. No respiratory distress. She has decreased breath sounds (2/2 effort). She has no wheezes. She exhibits no tenderness.  Somewhat decreased aeration bilaterally 2/2 low effort.  No wheezing or rhonchi  Abdominal: Soft. There is no tenderness. There is no rebound and no guarding.  Musculoskeletal: Normal range of motion. She exhibits no tenderness.  Lymphadenopathy:    She has no cervical adenopathy.  Neurological: She is alert. She displays atrophy. No cranial nerve deficit. She exhibits abnormal muscle tone.  Contractures in all extremities 2/2 nonuse.  Increased tone throughout and minimal movement of any extremities.  Minimally verbal (can say first name, yes, no).  Responds to most yes/no questions and will grunt to painful stimuli.    Skin: Skin is warm and dry. She is not diaphoretic.  Nursing note and vitals reviewed.   ED Course  Procedures (including critical care time) Labs Review Labs Reviewed  CBC WITH DIFFERENTIAL/PLATELET - Abnormal; Notable for the following:    WBC 18.0 (*)    HCT 35.5 (*)    All other components within normal limits  COMPREHENSIVE METABOLIC PANEL - Abnormal; Notable for the following:    Potassium 5.7 (*)    CO2 18 (*)    BUN 118 (*)    Creatinine, Ser 4.74 (*)    Calcium 10.6 (*)     Total Protein 9.0 (*)    Albumin 3.1 (*)    AST 13 (*)    ALT 12 (*)    GFR calc non Af Amer 9 (*)    GFR calc Af Amer 11 (*)    All other components within normal limits  URINE CULTURE  GRAM STAIN  BRAIN NATRIURETIC PEPTIDE  URINALYSIS, ROUTINE W REFLEX MICROSCOPIC (NOT AT [facility])  LACTIC ACID, PLASMA  MAGNESIUM  PHOSPHORUS  PROCALCITONIN  PROCALCITONIN  CBG MONITORING, ED  Randolm Idol, ED    Imaging Review Dg Chest Portable 1 View  12/13/2014   CLINICAL DATA:  Altered mental status.  Hypotension.  EXAM: PORTABLE CHEST - 1 VIEW  COMPARISON:  06/27/2012  FINDINGS: Heart size and pulmonary vascularity are normal and the lungs are clear. No osseous abnormality.  IMPRESSION: Normal chest.   Electronically Signed   By: Lorriane Shire M.D.   On: 12/13/2014 15:55     EKG Interpretation  Date/Time:  Saturday December 13 2014 15:25:32 EDT Ventricular Rate:  105 PR Interval:  140 QRS Duration: 87 QT Interval:  330 QTC Calculation: 436 R Axis:   71 Text Interpretation:  Sinus tachycardia ST elevation, consider inferior  injury Confirmed by BEATON  MD, ROBERT ([id number]) on 12/13/2014 3:31:06 PM      MDM   Final diagnoses:  AKI (acute kidney injury)  Hyperkalemia  Failure to thrive in adult  Anorexia  Hypotension, unspecified hypotension type   Pt is a 54 yo F with hx of MS, DM, HTN, and chronic lower extremity edema who presents from home for failure to thrive.  Family reports she has been refusing to eat or drink for ~ 1 week.  At baseline is bedridden and totally dependent on others for care.  Minimal movement of upper or lower extremities.  Speaks full name but otherwise usually just nods yes/no.  Boyfriend has been taking care of her this week, then neighbor checked on her today and was worried that she looked much more ill than usual.  Called EMS who found her hypotensive at 70/50.  Given 500 cc NS and zofran en route.    Additional 1L NS bolus given here on arrival due to soft  pressures.  Has a hx of CHF per EMS report but no EF in our system so using caution in fluid rehydration.  Looks chronically ill but overall benign acute exam.  Decreased aeration bilaterally but likely 2/2 weakness and poor effort.  Abd soft.  No obvious wounds or rashes.  Diaper in place 2/2 neurogenic bladder.  Minimal movement of all extremities, increased tone throughout.    Broad labs sent along with EKG, CXR.    Labs returned with hyperkalemia at 5.7 but no significant peaked T waves by my read of EKG.  Has AKI with Cr 4.7 and BUN 118 (baseline Cr is 0.6).   WBC 18, Hb 12 (baseline 9), likely 2/2 contraction CXR with no signs of pneumonia.    Spoke to Dr. Verlon Au with triad hospitalist team at 1820.  To admit to Step Down unit.  Kayexcalate and IVF rate at 100 cc/hr ordered per his request.  Procalcitonin added onto labs.   Stable to move when a bed became available.    If performed, labs, EKGs, and imaging were reviewed and interpreted by myself and my attending, and incorporated in the medical decision making.  Patient was seen with ED Attending, Dr. Lucina Mellow, MD      Tori Milks, MD 12/15/14 [id number]  Leonard Schwartz, MD 12/28/14 [phone]

## 2014-12-13 NOTE — H&P (Addendum)
[facility] History and Physical  [name] HWE:[id number] DOB: Aug 09, 1960 DOA: 12/13/2014  Referring physician: Dr. Terese Door MD PCP: [name], MD  Specialists: none  Chief Complaint: Multiple  HPI:    54 y/o ? known h/o Multiple sclerosis since 1995-diagnosed formally 2002-previously on Betaseron-[place] bound from 2008 followed by Dr. Zenovia [facility] 09/2012 indicating patient did not like Betaseron and that she might not benefit from MOdifying therapy anymore, DM ty2, Mild anemia, Prior episodes Pyelonephritis, incisional hernia from a prior Fibroidectomy s/p repair, prior PNA 06/2012  History severely limited as patient does not seem to want to cooperate with telling me what brought her here. Apparently her neighbor Jenene Slicker at phone [phone] (I was not able to reach her on telephone) went to visit her 12/13/14 and noted that she did not seem to be doing well. Physically she is limited in terms of what she can do but usually looks better. EMS was called and patient initially called it off herself but finally relented to coming to the emergency room to getting some IV fluids. From what I'm able to gather from nursing report and EMS report-patient Has not been doing well for about 1-2 weeks. Patient was found to be hypertensive 70/50 and 500 cc normal saline and Zofran were given. She was not really verbal to the emergency room physician  Nursing and pharmacy technician  reports that the patient has not been taking her medications for at least the past 2 weeks. The last time she allegedly took her Lasix was 12/11/14. She does not appear to have been seen recently by any of her primary physicians including her neurologist.  On admission noted blood pressure 88/48, pulse rate 110, respiratory rate 21 and MAXIMUM TEMPERATURE 98  Potassium 5.7, BUN 118/creatinine 4.7, calcium 10.6, AST 13, ALT 12, alkaline 3.1, BNP 56, point of care troponin 0.03, lactic acid  1.9, WBC 18,: 12.2, platelets 321  Portable chest x-ray normal EKG = sinus tachycardia potential rate related changes here and full 0 point 16 QRS axis 90, peak T waves across V2 V3 V4 compared to EKG 06/2012 no T-wave inversions  Nursing reports on placement of the catheter there was purulent urine was obtained.   Review of Systems: cannot obtain  Past Medical History  Diagnosis Date  . MS (multiple sclerosis)   . Hyperglycemia   . Edema   . High blood pressure    Past Surgical History  Procedure Laterality Date  . Hernia mesh removal    . 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             No Known Allergies  Family History  Problem Relation Age of Onset  . Diabetes Mother   . Diabetes Father   . Diabetes Sister   . Scoliosis Sister   . Alzheimer's disease Mother     Prior to Admission medications   Medication Sig Start Date End Date Taking? Authorizing Provider  albuterol (PROVENTIL HFA;VENTOLIN HFA) 108 (90 BASE) MCG/ACT inhaler Inhale 2 puffs into the lungs every 4 (four) hours as needed for wheezing or shortness of breath. Patient taking differently: Inhale 2 puffs into the lungs 2 (two) times daily as needed for wheezing or shortness of breath. PROAIR 06/29/12  Yes [name], MD  aspirin EC 81  MG tablet Take 81 mg by mouth daily.    Yes Historical Provider, MD  baclofen (LIORESAL) 10 MG tablet Take 10 mg by mouth 2 (two) times daily.    Yes Historical Provider, MD  buPROPion (WELLBUTRIN SR) 150 MG 12 hr tablet Take 150 mg by mouth 2 (two) times daily. 11/21/14  Yes Historical Provider, MD  doxycycline (VIBRAMYCIN) 100 MG capsule Take 100 mg by mouth 2 (two) times daily. 09/24/14  Yes Historical Provider, MD  furosemide (LASIX) 40 MG tablet Take 1 tablet (40 mg total) by mouth daily. Patient taking  differently: Take 40 mg by mouth 3 (three) times daily.  06/29/12  Yes [name], MD  glipiZIDE (GLUCOTROL) 5 MG tablet Take 2.5 mg by mouth 2 (two) times daily before a meal.  12/08/14  Yes Historical Provider, MD  lisinopril (PRINIVIL,ZESTRIL) 2.5 MG tablet Take 2.5 mg by mouth daily. 12/05/14  Yes Historical Provider, MD  lovastatin (MEVACOR) 20 MG tablet Take 20 mg by mouth daily.   Yes Historical Provider, MD  menthol-cetylpyridinium (CEPACOL) 3 MG lozenge Take 1 lozenge by mouth.   Yes Historical Provider, MD  metFORMIN (GLUCOPHAGE) 500 MG tablet Take 500 mg by mouth 2 (two) times daily. 12/08/14  Yes Historical Provider, MD  potassium chloride SA (K-DUR,KLOR-CON) 20 MEQ tablet Take 20 mEq by mouth 2 (two) times daily.   Yes Historical Provider, MD  solifenacin (VESICARE) 5 MG tablet Take 5 mg by mouth daily.   Yes Historical Provider, MD  tiZANidine (ZANAFLEX) 4 MG tablet Take 4 mg by mouth 2 (two) times daily.    Yes Historical Provider, MD  ferrous sulfate 325 (65 FE) MG tablet Take 1 tablet (325 mg total) by mouth 2 (two) times daily. 06/29/12   [name], MD   Physical Exam: Filed Vitals:   12/13/14 1730 12/13/14 1815 12/13/14 1830 12/13/14 1845  BP: 100/53 90/50 98/50  92/55  Pulse:      Temp:      TempSrc:      Resp: 23 17 17 19   SpO2:        On exam no pallor no icterus Very dry mucosa S1-S2 tachycardic no murmur rub or gallop noted JVD Chest clinically clear no added sound Limited exam however Abdomen tender right lower quadrant Wearing depends underwear No rebound no guarding Able to move upper extremities but does not seem to want to cooperate with exam. Reflexes 2/3, power limited by patient cooperation Sensory not able to obtain Lower extremities have Unna boots on however after cutting off the dressings I do not see any ulcer or any wound bilaterally she does have venous stasis changes in addition to hyperkeratosis over the toes   Labs on Admission:  Basic  Metabolic Panel:  Recent Labs Lab 12/13/14 1620  NA 141  K 5.7*  CL 108  CO2 18*  GLUCOSE 74  BUN 118*  CREATININE 4.74*  CALCIUM 10.6*   Liver Function Tests:  Recent Labs Lab 12/13/14 1620  AST 13*  ALT 12*  ALKPHOS 73  BILITOT 0.6  PROT 9.0*  ALBUMIN 3.1*   No results for input(s): LIPASE, AMYLASE in the last 168 hours. No results for input(s): AMMONIA in the last 168 hours. CBC:  Recent Labs Lab 12/13/14 1620  WBC 18.0*  NEUTROABS PENDING  HGB 12.2  HCT 35.5*  MCV 86.6  PLT 321   Cardiac Enzymes: No results for input(s): CKTOTAL, CKMB, CKMBINDEX, TROPONINI in the last 168 hours.  BNP (last 3 results)  Recent Labs  12/13/14 1620  BNP 56.9    ProBNP (last 3 results) No results for input(s): PROBNP in the last 8760 hours.  CBG:  Recent Labs Lab 12/13/14 1625  GLUCAP 72    Radiological Exams on Admission: Dg Chest Portable 1 View  12/13/2014   CLINICAL DATA:  Altered mental status.  Hypotension.  EXAM: PORTABLE CHEST - 1 VIEW  COMPARISON:  06/27/2012  FINDINGS: Heart size and pulmonary vascularity are normal and the lungs are clear. No osseous abnormality.  IMPRESSION: Normal chest.   Electronically Signed   By: Lorriane Shire M.D.   On: 12/13/2014 15:55    EKG: Independently reviewed.   Assessment/Plan Principal Problem:   At high risk for severe sepsis-Urine purulent-source likely from this Patient has responded well to bolus IV saline 500 cc X2 Start on a rate of saline 100 cc per hour Cycle lactic acid Pro calcitonin protocol CBC plus differential in a.m. Follow urine culture Unlikely source to be elsewhere is no decubitus ulcer, chest x-ray benign and although patient seems to have metabolic encephalopathy I believe this is more behavioral than actual infectious Very high risk for decompensation Patient tells me that she does not want to be resuscitated i.e. DO NOT RESUSCITATE however the friend that she was living To [facility]  indicated that she was a full code We will honor full CODE STATUS until we can sort this out with family and next of kin whenever that may be  Active Problems:   Multiple sclerosis diagnosed 2002-not on Therapy any longer-poor prognosis overall. I will hold off on tizanidine and baclofen and bupropion as these medications are cleared primarily by the kidney   Diabetes mellitus-monitor only. If blood sugar above 200 will use ICU protocol for coverage   High blood pressure-see below discussion   AKI (acute kidney injury)-prerenal azotemia likely secondary to use of Lasix 40 mg daily, lisinopril 2.5 daily, worsened by metformin 500 twice a day. IV saline as above. Limit all nephrotoxic and   Metabolic acidosis-type a plus type B metabolic acidosis as is on metformin-unclear as to when she took last medication.   Chronic lymphedema-has Unna boots on bilaterally which do not appear to have any infectious skin sores beneath them. See above discussion   CRITICAL CARE Performed by: Nita Sells   Total critical care time: 95  Critical care time was exclusive of separately billable procedures and treating other patients.  Critical care was necessary to treat or prevent imminent or life-threatening deterioration.  Critical care was time spent personally by me on the following activities: development of treatment plan with patient and/or surrogate as well as nursing, discussions with consultants, evaluation of patient's response to treatment, examination of patient, obtaining history from patient or surrogate, ordering and performing treatments and interventions, ordering and review of laboratory studies, ordering and review of radiographic studies, pulse oximetry and re-evaluation of patient's condition.   Time spent: 95 I attempted to call next her neighbor Jenene Slicker as indicated above and spoke to a relative by the name of [name] at [phone] who is not the power of  attorney. I've indicated to them that the patient is sick but is stable and we will have to determine next steps once we can have a family discussion as patient is not able/willing to discuss her medical care at present time [name] tells me that she saw the patient about 1-2 weeks: Patient was doing "well"  [place], [facility] Pager [phone]  If 7PM-7AM, please contact night-coverage [email or web address] Password [email or web address] 12/13/2014, 7:05 PM

## 2014-12-13 NOTE — ED Notes (Signed)
Report from lab  Patients urine with gram negative rods and gram positive cocci, full of sedimentation and neutrophils  Will call to floor

## 2014-12-13 NOTE — ED Notes (Signed)
Pt to ED via [facility] with c/o hypotension, and failure to thrive.  Pt refuses to eat or drink anything  Pt is from home

## 2014-12-13 NOTE — Progress Notes (Signed)
Rocephin Protocol  54 yo who came in with hypotension and FTT. A UA was done in the ED and it showed pos for nitrite with leukocytes. Rocephin has been ordered for urosepsis.   Plan  Rocephin 2g IV q24 If only UTI, will decrease dose to 1g q24  Onnie Boer, PharmD Pager: [phone] 12/13/2014 8:20 PM

## 2014-12-13 NOTE — ED Notes (Signed)
Attempted report 

## 2014-12-14 ENCOUNTER — Inpatient Hospital Stay (HOSPITAL_COMMUNITY): Payer: Medicare Other

## 2014-12-14 DIAGNOSIS — A419 Sepsis, unspecified organism: Secondary | ICD-10-CM

## 2014-12-14 LAB — CBC WITH DIFFERENTIAL/PLATELET
Basophils Absolute: 0 10*3/uL (ref 0.0–0.1)
Basophils Relative: 0 % (ref 0–1)
Eosinophils Absolute: 0.4 10*3/uL (ref 0.0–0.7)
Eosinophils Relative: 3 % (ref 0–5)
HEMATOCRIT: 28.9 % — AB (ref 36.0–46.0)
HEMOGLOBIN: 9.8 g/dL — AB (ref 12.0–15.0)
LYMPHS PCT: 19 % (ref 12–46)
Lymphs Abs: 2.4 10*3/uL (ref 0.7–4.0)
MCH: 29.3 pg (ref 26.0–34.0)
MCHC: 33.9 g/dL (ref 30.0–36.0)
MCV: 86.3 fL (ref 78.0–100.0)
MONO ABS: 0.4 10*3/uL (ref 0.1–1.0)
Monocytes Relative: 4 % (ref 3–12)
Neutro Abs: 9.3 10*3/uL — ABNORMAL HIGH (ref 1.7–7.7)
Neutrophils Relative %: 74 % (ref 43–77)
Platelets: 289 10*3/uL (ref 150–400)
RBC: 3.35 MIL/uL — ABNORMAL LOW (ref 3.87–5.11)
RDW: 14.9 % (ref 11.5–15.5)
WBC: 12.5 10*3/uL — AB (ref 4.0–10.5)

## 2014-12-14 LAB — COMPREHENSIVE METABOLIC PANEL
ALT: 129 U/L — AB (ref 14–54)
AST: 243 U/L — ABNORMAL HIGH (ref 15–41)
Albumin: 2.1 g/dL — ABNORMAL LOW (ref 3.5–5.0)
Alkaline Phosphatase: 78 U/L (ref 38–126)
Anion gap: 8 (ref 5–15)
BUN: 79 mg/dL — AB (ref 6–20)
CALCIUM: 8.9 mg/dL (ref 8.9–10.3)
CO2: 17 mmol/L — AB (ref 22–32)
Chloride: 119 mmol/L — ABNORMAL HIGH (ref 101–111)
Creatinine, Ser: 2.71 mg/dL — ABNORMAL HIGH (ref 0.44–1.00)
GFR calc Af Amer: 21 mL/min — ABNORMAL LOW (ref >60)
GFR calc non Af Amer: 19 mL/min — ABNORMAL LOW (ref >60)
GLUCOSE: 155 mg/dL — AB (ref 65–99)
Potassium: 4.1 mmol/L (ref 3.5–5.1)
SODIUM: 144 mmol/L (ref 135–145)
TOTAL PROTEIN: 5.7 g/dL — AB (ref 6.5–8.1)
Total Bilirubin: 0.2 mg/dL — ABNORMAL LOW (ref 0.3–1.2)

## 2014-12-14 LAB — PROCALCITONIN: Procalcitonin: 2.38 ng/mL

## 2014-12-14 LAB — APTT
APTT: 27 s (ref 24–37)
aPTT: 24 seconds (ref 24–37)

## 2014-12-14 LAB — GRAM STAIN

## 2014-12-14 LAB — PHOSPHORUS: Phosphorus: 3.7 mg/dL (ref 2.5–4.6)

## 2014-12-14 LAB — PROTIME-INR
INR: 1.42 (ref 0.00–1.49)
Prothrombin Time: 17.5 seconds — ABNORMAL HIGH (ref 11.6–15.2)

## 2014-12-14 LAB — GLUCOSE, CAPILLARY
GLUCOSE-CAPILLARY: 141 mg/dL — AB (ref 65–99)
GLUCOSE-CAPILLARY: 154 mg/dL — AB (ref 65–99)
GLUCOSE-CAPILLARY: 217 mg/dL — AB (ref 65–99)
Glucose-Capillary: 43 mg/dL — CL (ref 65–99)
Glucose-Capillary: 165 mg/dL — ABNORMAL HIGH (ref 65–99)
Glucose-Capillary: 137 mg/dL — ABNORMAL HIGH (ref 65–99)

## 2014-12-14 LAB — POCT I-STAT 3, ART BLOOD GAS (G3+)
Acid-base deficit: 12 mmol/L — ABNORMAL HIGH (ref 0.0–2.0)
Bicarbonate: 14.4 mEq/L — ABNORMAL LOW (ref 20.0–24.0)
O2 Saturation: 97 %
PCO2 ART: 32 mmHg — AB (ref 35.0–45.0)
PO2 ART: 98 mmHg (ref 80.0–100.0)
TCO2: 15 mmol/L (ref 0–100)
pH, Arterial: 7.261 — ABNORMAL LOW (ref 7.350–7.450)

## 2014-12-14 LAB — LACTIC ACID, PLASMA
LACTIC ACID, VENOUS: 3.2 mmol/L — AB (ref 0.5–2.0)
LACTIC ACID, VENOUS: 1 mmol/L (ref 0.5–2.0)
LACTIC ACID, VENOUS: 1.3 mmol/L (ref 0.5–2.0)
Lactic Acid, Venous: 0.8 mmol/L (ref 0.5–2.0)

## 2014-12-14 LAB — MAGNESIUM: Magnesium: 1.7 mg/dL (ref 1.7–2.4)

## 2014-12-14 LAB — MRSA PCR SCREENING: MRSA by PCR: NEGATIVE

## 2014-12-14 LAB — CORTISOL: CORTISOL PLASMA: 24.6 ug/dL

## 2014-12-14 MED ORDER — SODIUM CHLORIDE 0.9 % IV BOLUS (SEPSIS)
1000.0000 mL | Freq: Once | INTRAVENOUS | Status: AC
  Administered 2014-12-14: 1000 mL via INTRAVENOUS

## 2014-12-14 MED ORDER — DEXTROSE 50 % IV SOLN
1.0000 | Freq: Once | INTRAVENOUS | Status: AC
  Administered 2014-12-14: 50 mL via INTRAVENOUS
  Filled 2014-12-14: qty 50

## 2014-12-14 MED ORDER — ATROPINE SULFATE 0.1 MG/ML IJ SOLN
INTRAMUSCULAR | Status: AC
  Filled 2014-12-14: qty 10

## 2014-12-14 MED ORDER — VANCOMYCIN HCL IN DEXTROSE 1-5 GM/200ML-% IV SOLN
1000.0000 mg | INTRAVENOUS | Status: DC
  Administered 2014-12-14: 1000 mg via INTRAVENOUS
  Filled 2014-12-14: qty 200

## 2014-12-14 MED ORDER — SODIUM CHLORIDE 0.9 % IV BOLUS (SEPSIS)
500.0000 mL | Freq: Once | INTRAVENOUS | Status: AC
  Administered 2014-12-14: 500 mL via INTRAVENOUS

## 2014-12-14 MED ORDER — HEPARIN SODIUM (PORCINE) 5000 UNIT/ML IJ SOLN: 5000.0000 [IU] | Freq: Three times a day (TID) | INTRAMUSCULAR | Status: DC

## 2014-12-14 MED ORDER — PIPERACILLIN-TAZOBACTAM IN DEX 2-0.25 GM/50ML IV SOLN
2.2500 g | Freq: Three times a day (TID) | INTRAVENOUS | Status: DC
  Administered 2014-12-14: 2.25 g via INTRAVENOUS
  Filled 2014-12-14 (×3): qty 50

## 2014-12-14 MED ORDER — PIPERACILLIN-TAZOBACTAM 3.375 G IVPB 30 MIN: 3.3750 g | Freq: Three times a day (TID) | INTRAVENOUS | Status: DC

## 2014-12-14 MED ORDER — PIPERACILLIN-TAZOBACTAM 3.375 G IVPB
3.3750 g | Freq: Three times a day (TID) | INTRAVENOUS | Status: DC
  Administered 2014-12-14 – 2014-12-16 (×6): 3.375 g via INTRAVENOUS
  Filled 2014-12-14 (×8): qty 50

## 2014-12-14 MED ORDER — NOREPINEPHRINE BITARTRATE 1 MG/ML IV SOLN
0.0000 ug/min | INTRAVENOUS | Status: DC
  Administered 2014-12-14: 16 ug/min via INTRAVENOUS
  Administered 2014-12-14: 7 ug/min via INTRAVENOUS
  Administered 2014-12-14: 15 ug/min via INTRAVENOUS
  Administered 2014-12-14: 18 ug/min via INTRAVENOUS
  Administered 2014-12-15: 6 ug/min via INTRAVENOUS
  Filled 2014-12-14 (×4): qty 4

## 2014-12-14 MED ORDER — SODIUM CHLORIDE 0.9 % IV SOLN
250.0000 mL | INTRAVENOUS | Status: DC | PRN
  Administered 2014-12-15: 04:00:00 via INTRAVENOUS

## 2014-12-14 MED ORDER — LIDOCAINE HCL (PF) 1 % IJ SOLN
INTRAMUSCULAR | Status: AC
  Filled 2014-12-14: qty 5

## 2014-12-14 NOTE — Progress Notes (Signed)
ANTIBIOTIC CONSULT NOTE - INITIAL  Pharmacy Consult for vancomycin Indication: sepsis  No Known Allergies  Patient Measurements: Height: 5\' 6"  (167.6 cm) Weight: 138 lb 10.7 oz (62.9 kg) IBW/kg (Calculated) : 59.3  Vital Signs: Temp: 97.7 F (36.5 C) (07/03 0331) Temp Source: Oral (07/03 0331) BP: 107/70 mmHg (07/03 0445) Pulse Rate: 58 (07/03 0445) Intake/Output from previous day: 07/02 0701 - 07/03 0700 In: 4418.3 [I.V.:1618.3; IV Piggyback:2800] Out: 2000 [Urine:2000] Intake/Output from this shift: Total I/O In: 3418.3 [I.V.:618.3; IV Piggyback:2800] Out: 2000 [Urine:2000]  Labs:  Recent Labs  12/13/14 1620  WBC 18.0*  HGB 12.2  PLT 321  CREATININE 4.74*   Estimated Creatinine Clearance: 12.4 mL/min (by C-G formula based on Cr of 4.74).   Microbiology: Recent Results (from the past 720 hour(s))  Gram stain     Status: None (Preliminary result)   Collection Time: 12/13/14  4:45 PM  Result Value Ref Range Status   Specimen Description URINE, CATHETERIZED  Final   Special Requests NONE  Final   Gram Stain   Final    WBC SEEN POLYMORPHONUCLEAR GRAM POSITIVE COCCI GRAM NEGATIVE RODS CYTOSPUN SLIDE CALLED RN B JACOBELLI AT [phone] [place]    Report Status PENDING  Incomplete  MRSA PCR Screening     Status: None   Collection Time: 12/13/14  9:45 PM  Result Value Ref Range Status   MRSA by PCR NEGATIVE NEGATIVE Final    Comment:        The GeneXpert MRSA Assay (FDA approved for NASAL specimens only), is one component of a comprehensive MRSA colonization surveillance program. It is not intended to diagnose MRSA infection nor to guide or monitor treatment for MRSA infections.     Medical History: Past Medical History  Diagnosis Date  . MS (multiple sclerosis)   . Hyperglycemia   . Edema   . High blood pressure     Medications:  Prescriptions prior to admission  Medication Sig Dispense Refill Last Dose  . albuterol (PROVENTIL  HFA;VENTOLIN HFA) 108 (90 BASE) MCG/ACT inhaler Inhale 2 puffs into the lungs every 4 (four) hours as needed for wheezing or shortness of breath. (Patient taking differently: Inhale 2 puffs into the lungs 2 (two) times daily as needed for wheezing or shortness of breath. PROAIR) 1 Inhaler 4 maybe 12/11/14  . aspirin EC 81 MG tablet Take 81 mg by mouth daily.    maybe 6/30  . baclofen (LIORESAL) 10 MG tablet Take 10 mg by mouth 2 (two) times daily.    maybe 6/30  . buPROPion (WELLBUTRIN SR) 150 MG 12 hr tablet Take 150 mg by mouth 2 (two) times daily.  0 maybe 6/30  . doxycycline (VIBRAMYCIN) 100 MG capsule Take 100 mg by mouth 2 (two) times daily.  0 maybe 6/30  . furosemide (LASIX) 40 MG tablet Take 1 tablet (40 mg total) by mouth daily. (Patient taking differently: Take 40 mg by mouth 3 (three) times daily. ) 30 tablet 0 maybe 6/30  . glipiZIDE (GLUCOTROL) 5 MG tablet Take 2.5 mg by mouth 2 (two) times daily before a meal.   0 maybe 6/30  . lisinopril (PRINIVIL,ZESTRIL) 2.5 MG tablet Take 2.5 mg by mouth daily.  0 maybe 6/30  . lovastatin (MEVACOR) 20 MG tablet Take 20 mg by mouth daily.   maybe 6/30  . menthol-cetylpyridinium (CEPACOL) 3 MG lozenge Take 1 lozenge by mouth.   unknown  . metFORMIN (GLUCOPHAGE) 500 MG tablet Take 500 mg by mouth 2 (  two) times daily.  0 maybe 6/30  . potassium chloride SA (K-DUR,KLOR-CON) 20 MEQ tablet Take 20 mEq by mouth 2 (two) times daily.   maybe 6/30  . solifenacin (VESICARE) 5 MG tablet Take 5 mg by mouth daily.   maybe 6/30  . tiZANidine (ZANAFLEX) 4 MG tablet Take 4 mg by mouth 2 (two) times daily.    maybe 6/30  . ferrous sulfate 325 (65 FE) MG tablet Take 1 tablet (325 mg total) by mouth 2 (two) times daily. 30 tablet 3 unknown   Scheduled:  . sodium chloride   Intravenous STAT  . aspirin EC  81 mg Oral Daily  . buPROPion  150 mg Oral BID  . heparin  5,000 Units Subcutaneous 3 times per day  . lidocaine (PF)      . piperacillin-tazobactam (ZOSYN)  IV   2.25 g Intravenous 3 times per day  . sodium chloride  3 mL Intravenous Q12H  . sodium polystyrene  30 g Oral Once   Infusions:  . norepinephrine (LEVOPHED) Adult infusion 16 mcg/min (12/14/14 0500)    Assessment: 54yo female initially started on Rocephin for urosepsis, then changed to Zosyn, now to broaden IV ABX for sepsis w/ rising lactate and hypotension, noted in AKI.  Goal of Therapy:  Vancomycin trough level 15-20 mcg/ml  Plan:  Will begin vancomycin 1000mg  IV Q48H and monitor CBC, Cx, SCr, levels prn.  Wynona Neat, PharmD, BCPS  12/14/2014,5:11 AM

## 2014-12-14 NOTE — Progress Notes (Signed)
CRITICAL VALUE ALERT  Critical value received:  Lactic acid 2.4  Date of notification: 12/13/14  Time of notification:  2245  Critical value read back: Yes  Nurse who received alert:  Sherryl Manges, RN  MD notified (1st page):  Reece Levy  Time of first page:  0015  MD notified (2nd page):  Time of second page:  Responding MD:    Time MD responded:

## 2014-12-14 NOTE — Progress Notes (Addendum)
ANTIBIOTIC CONSULT NOTE - INITIAL  Pharmacy Consult for Zosyn Indication: rule out sepsis  No Known Allergies  Patient Measurements: Height: 5\' 6"  (167.6 cm) Weight: 124 lb 12.5 oz (56.6 kg) IBW/kg (Calculated) : 59.3  Vital Signs: Temp: 97.3 F (36.3 C) (07/02 2340) Temp Source: Oral (07/02 2340) BP: 93/62 mmHg (07/03 0000) Pulse Rate: 85 (07/03 0000) Intake/Output from previous day: 07/02 0701 - 07/03 0700 In: 1000 [I.V.:1000] Out: 1300 [Urine:1300] Intake/Output from this shift: Total I/O In: -  Out: 1300 [Urine:1300]  Labs:  Recent Labs  12/13/14 1620  WBC 18.0*  HGB 12.2  PLT 321  CREATININE 4.74*   Estimated Creatinine Clearance: 11.8 mL/min (by C-G formula based on Cr of 4.74). No results for input(s): VANCOTROUGH, VANCOPEAK, VANCORANDOM, GENTTROUGH, GENTPEAK, GENTRANDOM, TOBRATROUGH, TOBRAPEAK, TOBRARND, AMIKACINPEAK, AMIKACINTROU, AMIKACIN in the last 72 hours.   Microbiology: Recent Results (from the past 720 hour(s))  Gram stain     Status: None (Preliminary result)   Collection Time: 12/13/14  4:45 PM  Result Value Ref Range Status   Specimen Description URINE, CATHETERIZED  Final   Special Requests NONE  Final   Gram Stain   Final    WBC SEEN POLYMORPHONUCLEAR GRAM POSITIVE COCCI GRAM NEGATIVE RODS CYTOSPUN SLIDE CALLED RN B JACOBELLI AT 1935 [phone] [place]    Report Status PENDING  Incomplete    Medical History: Past Medical History  Diagnosis Date  . MS (multiple sclerosis)   . Hyperglycemia   . Edema   . High blood pressure     Medications:  Prescriptions prior to admission  Medication Sig Dispense Refill Last Dose  . albuterol (PROVENTIL HFA;VENTOLIN HFA) 108 (90 BASE) MCG/ACT inhaler Inhale 2 puffs into the lungs every 4 (four) hours as needed for wheezing or shortness of breath. (Patient taking differently: Inhale 2 puffs into the lungs 2 (two) times daily as needed for wheezing or shortness of breath. PROAIR) 1 Inhaler 4 maybe  12/11/14  . aspirin EC 81 MG tablet Take 81 mg by mouth daily.    maybe 6/30  . baclofen (LIORESAL) 10 MG tablet Take 10 mg by mouth 2 (two) times daily.    maybe 6/30  . buPROPion (WELLBUTRIN SR) 150 MG 12 hr tablet Take 150 mg by mouth 2 (two) times daily.  0 maybe 6/30  . doxycycline (VIBRAMYCIN) 100 MG capsule Take 100 mg by mouth 2 (two) times daily.  0 maybe 6/30  . furosemide (LASIX) 40 MG tablet Take 1 tablet (40 mg total) by mouth daily. (Patient taking differently: Take 40 mg by mouth 3 (three) times daily. ) 30 tablet 0 maybe 6/30  . glipiZIDE (GLUCOTROL) 5 MG tablet Take 2.5 mg by mouth 2 (two) times daily before a meal.   0 maybe 6/30  . lisinopril (PRINIVIL,ZESTRIL) 2.5 MG tablet Take 2.5 mg by mouth daily.  0 maybe 6/30  . lovastatin (MEVACOR) 20 MG tablet Take 20 mg by mouth daily.   maybe 6/30  . menthol-cetylpyridinium (CEPACOL) 3 MG lozenge Take 1 lozenge by mouth.   unknown  . metFORMIN (GLUCOPHAGE) 500 MG tablet Take 500 mg by mouth 2 (two) times daily.  0 maybe 6/30  . potassium chloride SA (K-DUR,KLOR-CON) 20 MEQ tablet Take 20 mEq by mouth 2 (two) times daily.   maybe 6/30  . solifenacin (VESICARE) 5 MG tablet Take 5 mg by mouth daily.   maybe 6/30  . tiZANidine (ZANAFLEX) 4 MG tablet Take 4 mg by mouth 2 (two) times daily.  maybe 6/30  . ferrous sulfate 325 (65 FE) MG tablet Take 1 tablet (325 mg total) by mouth 2 (two) times daily. 30 tablet 3 unknown   Assessment: 54 y.o. female presents with c/o hypotension and FTT. Noted pt with h/o MS. Initially started on Rocephin for suspected UTI. WBC up to 18. LA 3.2 PCT 5.37. To broaden abx to Zosyn for sepsis. Pt with AKI. SCr 4.74, est CrCl 12 ml/min.  Goal of Therapy:  Resolution of infection  Plan:  Zosyn 2.25gm IV q8h Will f/u renal function, micro data, and pt's clinical condition  Sherlon Handing, PharmD, BCPS Clinical pharmacist, pager [phone] 12/14/2014,1:27 AM    Addendum: Scr decreased to 2.7 - will increase  Zosyn to 3.375g IV q8h Continue the same vancomycin dose for now.  Will reassess in the morning.  Heide Guile, PharmD, BCPS-AQ ID Clinical Pharmacist Pager [phone]

## 2014-12-14 NOTE — Progress Notes (Signed)
Lactic acid 3.2 was 2.4. Lenon Curt, MD

## 2014-12-14 NOTE — Progress Notes (Signed)
PULMONARY / CRITICAL CARE MEDICINE   Name: [name] MRN: [id number] DOB: [birth year]    ADMISSION DATE:  12/13/2014  CHIEF COMPLAINT:  Hypotension  INITIAL PRESENTATION: 50 F with MS presenting with septic shock in setting of likely pyelonephritis.  STUDIES:  Urine culture, 7/2 (+) Gram positive and gram negative organism  SIGNIFICANT EVENTS: Persistent Hypotension, 7/32 --> 7/3  HISTORY OF PRESENT ILLNESS:  [name] is a 68 F with severe MS, debility, and prior pyelonephritis who presented to [facility] on 7/2 for unclear reasons. She was subsequently found to have hypotension and likely pyeloneprhitis. She was initially admitted to the SDU but remained hypotensive despite multiple fluid boluses. She was transferred to the ICU for further evaluation. She is currently confused and unable to provide any useful history.   PAST MEDICAL HISTORY :   has a past medical history of MS (multiple sclerosis); Hyperglycemia; Edema; and High blood pressure.  has past surgical history that includes Hernia mesh removal and tubes tided. Prior to Admission medications   Medication Sig Start Date End Date Taking? Authorizing Provider  albuterol (PROVENTIL HFA;VENTOLIN HFA) 108 (90 BASE) MCG/ACT inhaler Inhale 2 puffs into the lungs every 4 (four) hours as needed for wheezing or shortness of breath. Patient taking differently: Inhale 2 puffs into the lungs 2 (two) times daily as needed for wheezing or shortness of breath. PROAIR 06/29/12  Yes [name], MD  aspirin EC 81 MG tablet Take 81 mg by mouth daily.    Yes Historical Provider, MD  baclofen (LIORESAL) 10 MG tablet Take 10 mg by mouth 2 (two) times daily.    Yes Historical Provider, MD  buPROPion (WELLBUTRIN SR) 150 MG 12 hr tablet Take 150 mg by mouth 2 (two) times daily. 11/21/14  Yes Historical Provider, MD  doxycycline (VIBRAMYCIN) 100 MG capsule Take 100 mg by mouth 2 (two) times daily. 09/24/14  Yes Historical Provider, MD  furosemide (LASIX) 40  MG tablet Take 1 tablet (40 mg total) by mouth daily. Patient taking differently: Take 40 mg by mouth 3 (three) times daily.  06/29/12  Yes [name], MD  glipiZIDE (GLUCOTROL) 5 MG tablet Take 2.5 mg by mouth 2 (two) times daily before a meal.  12/08/14  Yes Historical Provider, MD  lisinopril (PRINIVIL,ZESTRIL) 2.5 MG tablet Take 2.5 mg by mouth daily. 12/05/14  Yes Historical Provider, MD  lovastatin (MEVACOR) 20 MG tablet Take 20 mg by mouth daily.   Yes Historical Provider, MD  menthol-cetylpyridinium (CEPACOL) 3 MG lozenge Take 1 lozenge by mouth.   Yes Historical Provider, MD  metFORMIN (GLUCOPHAGE) 500 MG tablet Take 500 mg by mouth 2 (two) times daily. 12/08/14  Yes Historical Provider, MD  potassium chloride SA (K-DUR,KLOR-CON) 20 MEQ tablet Take 20 mEq by mouth 2 (two) times daily.   Yes Historical Provider, MD  solifenacin (VESICARE) 5 MG tablet Take 5 mg by mouth daily.   Yes Historical Provider, MD  tiZANidine (ZANAFLEX) 4 MG tablet Take 4 mg by mouth 2 (two) times daily.    Yes Historical Provider, MD  ferrous sulfate 325 (65 FE) MG tablet Take 1 tablet (325 mg total) by mouth 2 (two) times daily. 06/29/12   [name], MD   No Known Allergies  FAMILY HISTORY:  indicated that her mother is alive. She indicated that her father is alive.  SOCIAL HISTORY:  reports that she quit smoking about 2 years ago. Her smoking use included Cigarettes. She has a 20 pack-year smoking history. She  does not have any smokeless tobacco history on file. She reports that she drinks about 3.0 oz of alcohol per week. She reports that she does not use illicit drugs.  REVIEW OF SYSTEMS:  Unable to obtain secondary to patient condition.   SUBJECTIVE:   VITAL SIGNS: Temp:  [97.3 F (36.3 C)-98 F (36.7 C)] 97.7 F (36.5 C) (07/03 0331) Pulse Rate:  [85-109] 85 (07/03 0200) Resp:  [14-26] 14 (07/03 0100) BP: (83-114)/(44-62) 91/44 mmHg (07/03 0200) SpO2:  [73 %-100 %] 100 % (07/03 0200) Weight:   [124 lb 12.5 oz (56.6 kg)] 124 lb 12.5 oz (56.6 kg) (07/02 2015) HEMODYNAMICS:   VENTILATOR SETTINGS:   INTAKE / OUTPUT:  Intake/Output Summary (Last 24 hours) at 12/14/14 0340 Last data filed at 12/14/14 0307  Gross per 24 hour  Intake 4318.33 ml  Output   2000 ml  Net 2318.33 ml    PHYSICAL EXAMINATION: General:  Elderly appearing F in NAD Neuro:  Fluctuating MS, A+O x 2 at one point (place and time) HEENT:  Sclera anicteric, conjunctiva pink, MM dry Cardiovascular:  RRR, nS1/S2, (-) MRG Lungs:  CTAB Abdomen:  S/ND/(+)BS, Mild diffuse TTP Musculoskeletal:  (-) C/C/E  Skin:  B/L LE elephantiasis   LABS:  CBC  Recent Labs Lab 12/13/14 1620  WBC 18.0*  HGB 12.2  HCT 35.5*  PLT 321   Coag's  Recent Labs Lab 12/13/14 2324  APTT 24   BMET  Recent Labs Lab 12/13/14 1620  NA 141  K 5.7*  CL 108  CO2 18*  BUN 118*  CREATININE 4.74*  GLUCOSE 74   Electrolytes  Recent Labs Lab 12/13/14 1620  CALCIUM 10.6*  MG 2.2  PHOS 5.6*   Sepsis Markers  Recent Labs Lab 12/13/14 1620 12/13/14 2130 12/13/14 2324  LATICACIDVEN 1.9 2.4* 3.2*  PROCALCITON 5.37  --   --    ABG No results for input(s): PHART, PCO2ART, PO2ART in the last 168 hours. Liver Enzymes  Recent Labs Lab 12/13/14 1620  AST 13*  ALT 12*  ALKPHOS 73  BILITOT 0.6  ALBUMIN 3.1*   Cardiac Enzymes  Recent Labs Lab 12/13/14 2156  TROPONINI <0.03   Glucose  Recent Labs Lab 12/13/14 1625 12/14/14 0325  GLUCAP 72 43*    Imaging Dg Chest Port 1 View  12/14/2014   CLINICAL DATA:  Acute onset of shortness of breath. Sepsis. Initial encounter.  EXAM: PORTABLE CHEST - 1 VIEW  COMPARISON:  Chest radiograph from 12/13/2014  FINDINGS: The lungs are well-aerated and clear. There is no evidence of focal opacification, pleural effusion or pneumothorax.  The cardiomediastinal silhouette is within normal limits. No acute osseous abnormalities are seen.  IMPRESSION: No acute  cardiopulmonary process seen.   Electronically Signed   By: Garald Balding M.D.   On: 12/14/2014 02:21   Dg Chest Portable 1 View  12/13/2014   CLINICAL DATA:  Altered mental status.  Hypotension.  EXAM: PORTABLE CHEST - 1 VIEW  COMPARISON:  06/27/2012  FINDINGS: Heart size and pulmonary vascularity are normal and the lungs are clear. No osseous abnormality.  IMPRESSION: Normal chest.   Electronically Signed   By: Lorriane Shire M.D.   On: 12/13/2014 15:55     ASSESSMENT / PLAN:  PULMONARY A: No acute issues P:     CARDIOVASCULAR CVL R Subclavian attempted, R Femoral Vein CVC 7/3 A:  Septic Shock: Most likely 2/2 pyelonephritis. No improvement with aggressive IVF and initial CTX regimen. No evidence of adrenal  insufficiency. S/p > 6 L IVF HTN P:  Pressors to maintain MAP >= 65 Serial Lactates   RENAL A:   Acute Renal Failure: Most likely 2/2 sepsis. AGMA Hyperkalemia P:   Serial BMPs Renal US  GASTROINTESTINAL A:   No acute issues P:     HEMATOLOGIC A:   Anemia: Likely masked by hemoconcentration. P:  Monitor  INFECTIOUS A:   Multi organism Pyelonephritis: P:   Checking renal Ultrasound, low threshold for CT to RO abscess/emphysematous pyelo.  BCx2, Reordering 7/3 UC (+) for both gram (+) and negative organism Abx: Vanc and Zosyn, start date 7/3, day 1/10  ENDOCRINE A:   DM   P:   SSI  NEUROLOGIC A:   AMS: Almost certainly 2/2 delirium MS P:   Holding home meds Consider Neuro consult in AM  FAMILY  - Updates: Updated emergency contact, cousin [name]. Also obtained verbal consent for central line plancement.  - Inter-disciplinary family meet or Palliative Care meeting due by:  day 7/10     TODAY'S SUMMARY:   CRITICAL CARE: The patient is critically ill with multiple organ systems failure and requires high complexity decision making for assessment and support, frequent evaluation and titration of therapies, application of advanced  monitoring technologies and extensive interpretation of multiple databases. Critical Care Time devoted to patient care services described in this note is 60 minutes.    Margarette Asal, MD Pulmonary and [place] Pager: [phone]  12/14/2014, 3:40 AM

## 2014-12-14 NOTE — Procedures (Signed)
Central Venous Catheter Insertion Procedure Note ABY GESSEL [id number] 07-Feb-1961  Procedure: Insertion of Central Venous Catheter Indications: Assessment of intravascular volume, Drug and/or fluid administration and Frequent blood sampling  Procedure Details Consent: Risks of procedure as well as the alternatives and risks of each were explained to the (patient/caregiver).  Consent for procedure obtained. Time Out: Verified patient identification, verified procedure, site/side was marked, verified correct patient position, special equipment/implants available, medications/allergies/relevent history reviewed, required imaging and test results available.  Performed  Maximum sterile technique was used including antiseptics, cap, gloves, gown, hand hygiene, mask and sheet. Skin prep: Chlorhexidine; local anesthetic administered A antimicrobial bonded/coated triple lumen catheter was placed in the right femoral vein due to multiple attempts, no other available access using the Seldinger technique.  Initially, an attempt was made to place a catheter in the right subclavian vein. The vein was accessed twice but the wire would not thread. The attempt was then abandoned.   Evaluation Blood flow good Complications: No apparent complications Patient did tolerate procedure well.  Aarin Bluett R. 12/14/2014, 4:51 AM  I used ultrasound to locate and access the vein/artery.

## 2014-12-14 NOTE — Progress Notes (Signed)
[place] Progress Note Patient Name: [name] DOB: January 02, 1961 MRN: [id number]   Date of Service  12/14/2014  HPI/Events of Note  Rising lactate despite 5L fluid and RN says SBP 90s. Very hard stick  - unable to stick for lab draws   Recent Labs Lab 12/13/14 1620  CREATININE 4.74*     Recent Labs Lab 12/13/14 1620  HGB 12.2  HCT 35.5*  WBC 18.0*  PLT 321     Recent Labs Lab 12/13/14 1620 12/13/14 2130 12/13/14 2324  LATICACIDVEN 1.9 2.4* 3.2*  PROCALCITON 5.37  --   --       eICU Interventions  500cc bolus x 1 IF no response, consider bedside PCCM Eval     Intervention Category Intermediate Interventions: Communication with other healthcare providers and/or family  Dakiya Puopolo 12/14/2014, 2:28 AM

## 2014-12-15 ENCOUNTER — Encounter (HOSPITAL_COMMUNITY): Payer: Self-pay

## 2014-12-15 ENCOUNTER — Inpatient Hospital Stay (HOSPITAL_COMMUNITY): Payer: Medicare Other

## 2014-12-15 DIAGNOSIS — Z9189 Other specified personal risk factors, not elsewhere classified: Secondary | ICD-10-CM

## 2014-12-15 DIAGNOSIS — A419 Sepsis, unspecified organism: Principal | ICD-10-CM

## 2014-12-15 DIAGNOSIS — N179 Acute kidney failure, unspecified: Secondary | ICD-10-CM

## 2014-12-15 DIAGNOSIS — N170 Acute kidney failure with tubular necrosis: Secondary | ICD-10-CM

## 2014-12-15 DIAGNOSIS — R6521 Severe sepsis with septic shock: Secondary | ICD-10-CM

## 2014-12-15 LAB — BASIC METABOLIC PANEL
Anion gap: 4 — ABNORMAL LOW (ref 5–15)
Anion gap: 8 (ref 5–15)
BUN: 29 mg/dL — AB (ref 6–20)
BUN: 19 mg/dL (ref 6–20)
CALCIUM: 8.8 mg/dL — AB (ref 8.9–10.3)
CHLORIDE: 106 mmol/L (ref 101–111)
CO2: 20 mmol/L — AB (ref 22–32)
CO2: 18 mmol/L — AB (ref 22–32)
CREATININE: 1.14 mg/dL — AB (ref 0.44–1.00)
CREATININE: 0.97 mg/dL (ref 0.44–1.00)
Calcium: 7.7 mg/dL — ABNORMAL LOW (ref 8.9–10.3)
Chloride: 113 mmol/L — ABNORMAL HIGH (ref 101–111)
GFR calc Af Amer: >60 mL/min (ref >60)
GFR calc non Af Amer: >60 mL/min (ref >60)
GFR, EST NON AFRICAN AMERICAN: 53 mL/min — AB (ref >60)
GLUCOSE: 122 mg/dL — AB (ref 65–99)
Glucose, Bld: 173 mg/dL — ABNORMAL HIGH (ref 65–99)
POTASSIUM: 3.7 mmol/L (ref 3.5–5.1)
Potassium: 3.9 mmol/L (ref 3.5–5.1)
SODIUM: 137 mmol/L (ref 135–145)
Sodium: 132 mmol/L — ABNORMAL LOW (ref 135–145)

## 2014-12-15 LAB — GLUCOSE, CAPILLARY
GLUCOSE-CAPILLARY: 101 mg/dL — AB (ref 65–99)
Glucose-Capillary: 112 mg/dL — ABNORMAL HIGH (ref 65–99)
Glucose-Capillary: 118 mg/dL — ABNORMAL HIGH (ref 65–99)
Glucose-Capillary: 164 mg/dL — ABNORMAL HIGH (ref 65–99)
Glucose-Capillary: 236 mg/dL — ABNORMAL HIGH (ref 65–99)

## 2014-12-15 LAB — URINE CULTURE: Culture: >=100000

## 2014-12-15 LAB — CBC
HCT: 27 % — ABNORMAL LOW (ref 36.0–46.0)
HEMOGLOBIN: 9.2 g/dL — AB (ref 12.0–15.0)
MCH: 29.1 pg (ref 26.0–34.0)
MCHC: 34.1 g/dL (ref 30.0–36.0)
MCV: 85.4 fL (ref 78.0–100.0)
Platelets: 255 10*3/uL (ref 150–400)
RBC: 3.16 MIL/uL — ABNORMAL LOW (ref 3.87–5.11)
RDW: 14.8 % (ref 11.5–15.5)
WBC: 12.3 10*3/uL — AB (ref 4.0–10.5)

## 2014-12-15 LAB — PROCALCITONIN: Procalcitonin: 0.78 ng/mL

## 2014-12-15 LAB — CORTISOL: CORTISOL PLASMA: 17 ug/dL

## 2014-12-15 MED ORDER — IOHEXOL 300 MG/ML  SOLN
100.0000 mL | Freq: Once | INTRAMUSCULAR | Status: AC | PRN
  Administered 2014-12-15: 80 mL via INTRAVENOUS

## 2014-12-15 MED ORDER — INSULIN ASPART 100 UNIT/ML ~~LOC~~ SOLN
0.0000 [IU] | SUBCUTANEOUS | Status: DC
  Administered 2014-12-15: 3 [IU] via SUBCUTANEOUS
  Administered 2014-12-15 – 2014-12-16 (×2): 2 [IU] via SUBCUTANEOUS
  Administered 2014-12-16: 1 [IU] via SUBCUTANEOUS

## 2014-12-15 MED ORDER — DEXTROSE-NACL 5-0.45 % IV SOLN
INTRAVENOUS | Status: DC
Start: 2014-12-15 — End: 2014-12-15

## 2014-12-15 MED ORDER — HYDROCORTISONE NA SUCCINATE PF 100 MG IJ SOLR
50.0000 mg | Freq: Four times a day (QID) | INTRAMUSCULAR | Status: DC
  Administered 2014-12-15 – 2014-12-16 (×4): 50 mg via INTRAVENOUS
  Filled 2014-12-15 (×2): qty 1
  Filled 2014-12-15: qty 2
  Filled 2014-12-15: qty 1
  Filled 2014-12-15 (×3): qty 2
  Filled 2014-12-15: qty 1

## 2014-12-15 MED ORDER — SODIUM CHLORIDE 0.45 % IV SOLN
INTRAVENOUS | Status: DC
  Administered 2014-12-15: 14:00:00 via INTRAVENOUS

## 2014-12-15 MED ORDER — HEPARIN BOLUS VIA INFUSION
3500.0000 [IU] | Freq: Once | INTRAVENOUS | Status: AC
  Administered 2014-12-15: 3500 [IU] via INTRAVENOUS
  Filled 2014-12-15: qty 3500

## 2014-12-15 MED ORDER — SODIUM CHLORIDE 0.9 % IV SOLN
INTRAVENOUS | Status: DC
  Administered 2014-12-15: 11:00:00 via INTRAVENOUS

## 2014-12-15 MED ORDER — HEPARIN (PORCINE) IN NACL 100-0.45 UNIT/ML-% IJ SOLN
1250.0000 [IU]/h | INTRAMUSCULAR | Status: AC
  Administered 2014-12-15: 1000 [IU]/h via INTRAVENOUS
  Administered 2014-12-16 – 2014-12-17 (×2): 1050 [IU]/h via INTRAVENOUS
  Filled 2014-12-15 (×4): qty 250

## 2014-12-15 MED ORDER — IOHEXOL 300 MG/ML  SOLN
25.0000 mL | INTRAMUSCULAR | Status: AC
Start: 2014-12-15 — End: 2014-12-15
  Administered 2014-12-15 (×2): 25 mL via ORAL

## 2014-12-15 NOTE — Progress Notes (Signed)
ANTICOAGULATION CONSULT NOTE - Initial Consult  Pharmacy Consult for Heparin Indication: VTE treatment  No Known Allergies  Patient Measurements: Height: 5\' 6"  (167.6 cm) Weight: 138 lb 0.1 oz (62.6 kg) IBW/kg (Calculated) : 59.3  Vital Signs: Temp: 98.6 F (37 C) (07/04 1619) Temp Source: Oral (07/04 1619) BP: 116/64 mmHg (07/04 2000) Pulse Rate: 87 (07/04 2000)  Labs:  Recent Labs  12/13/14 1620 12/13/14 2156 12/13/14 2324 12/14/14 0534 12/14/14 0535 12/15/14 0500 12/15/14 1817  HGB 12.2  --   --   --  9.8* 9.2*  --   HCT 35.5*  --   --   --  28.9* 27.0*  --   PLT 321  --   --   --  289 255  --   APTT  --   --  24 27  --   --   --   LABPROT  --   --   --   --  17.5*  --   --   INR  --   --   --   --  1.42  --   --   CREATININE 4.74*  --   --   --  2.71* 1.14* 0.97  TROPONINI  --  <0.03  --   --   --   --   --     Estimated Creatinine Clearance: 60.6 mL/min (by C-G formula based on Cr of 0.97).   Medical History: Past Medical History  Diagnosis Date  . MS (multiple sclerosis)   . Hyperglycemia   . Edema   . High blood pressure   . Diabetes mellitus without complication    Assessment: 54 yo F presents on 7/2 with multiple complaints and admitted to the ICU. CT of the abdomen showed thrombus within the left external iliac and common femoral veins. Pharmacy to start heparin for VTE treatment. Hgb 9.2, plts wnl. No s/s of bleed.  Goal of Therapy:  Heparin level 0.3-0.7 units/ml Monitor platelets by anticoagulation protocol: Yes   Plan:  Give 3,500 units heparin BOLUS Start heparin gtt at 1,000 units/hr Check 6 hr HL Monitor daily HL, CBC, s/s of bleed   Zyrus Hetland J 12/15/2014,8:21 PM

## 2014-12-15 NOTE — Progress Notes (Signed)
PULMONARY / CRITICAL CARE MEDICINE   Name: [name] MRN: [id number] DOB: 12/24/1960    ADMISSION DATE:  12/13/2014  CHIEF COMPLAINT:  Hypotension  INITIAL PRESENTATION: 49 F with severe MS presenting 7/2 with septic shock in setting of likely pyelonephritis. Initially admitted to SDU.  7/2 had worsening hypotension requiring CVL and tx ICU for pressors.   STUDIES:  Urine culture, 7/2 (+) Gram positive and gram negative organism Renal u/s 7/3>>>1. Increased renal echogenicity, suggesting medical renal disease. 2. Left-sided renal calculi. 3. Suggestion of right renal pelvic wall thickening, possibly representing ascending infection. 4. Right renal cyst. Left renal lesion which is likely a cyst or complex cyst.  SIGNIFICANT EVENTS: Persistent Hypotension, 7/32 --> 7/3  SUBJECTIVE:   Remains on pressors but weaning.  No acute change overnight.  Renal function improving. Lactate normal.   VITAL SIGNS: Temp:  [97.7 F (36.5 C)-99.4 F (37.4 C)] 97.7 F (36.5 C) (07/04 0734) Pulse Rate:  [49-96] 65 (07/04 0700) Resp:  [12-24] 18 (07/04 0700) BP: (84-139)/(47-90) 110/54 mmHg (07/04 0700) SpO2:  [98 %-100 %] 100 % (07/04 0700) Weight:  [138 lb 0.1 oz (62.6 kg)] 138 lb 0.1 oz (62.6 kg) (07/04 0606) HEMODYNAMICS: CVP:  [6 mmHg-11 mmHg] 6 mmHg VENTILATOR SETTINGS:   INTAKE / OUTPUT:  Intake/Output Summary (Last 24 hours) at 12/15/14 0912 Last data filed at 12/15/14 0700  Gross per 24 hour  Intake 3139.67 ml  Output   4275 ml  Net -1135.33 ml    PHYSICAL EXAMINATION: General:  Elderly appearing F in NAD Neuro:  Awake, interactive but drowsy at times, oriented x2 HEENT:  Sclera anicteric, conjunctiva pink, MM dry Cardiovascular:  RRR, nS1/S2, (-) MRG Lungs:  resps even non labored, CTAB Abdomen:  S/ND/(+)BS Musculoskeletal:  (-) C/C/E  Skin:  B/L LE venous stasis  LABS:  CBC  Recent Labs Lab 12/13/14 1620 12/14/14 0535 12/15/14 0500  WBC 18.0* 12.5* 12.3*  HGB  12.2 9.8* 9.2*  HCT 35.5* 28.9* 27.0*  PLT 321 289 255   Coag's  Recent Labs Lab 12/13/14 2324 12/14/14 0534 12/14/14 0535  APTT 24 27  --   INR  --   --  1.42   BMET  Recent Labs Lab 12/13/14 1620 12/14/14 0535 12/15/14 0500  NA 141 144 137  K 5.7* 4.1 3.7  CL 108 119* 113*  CO2 18* 17* 20*  BUN 118* 79* 29*  CREATININE 4.74* 2.71* 1.14*  GLUCOSE 74 155* 122*   Electrolytes  Recent Labs Lab 12/13/14 1620 12/14/14 0535 12/15/14 0500  CALCIUM 10.6* 8.9 8.8*  MG 2.2 1.7  --   PHOS 5.6* 3.7  --    Sepsis Markers  Recent Labs Lab 12/13/14 1620  12/14/14 0535 12/14/14 1130 12/14/14 1650 12/15/14 0500  LATICACIDVEN 1.9  < > 1.0 1.3 0.8  --   PROCALCITON 5.37  --  2.38  --   --  0.78  < > = values in this interval not displayed. ABG  Recent Labs Lab 12/14/14 0540  PHART 7.261*  PCO2ART 32.0*  PO2ART 98.0   Liver Enzymes  Recent Labs Lab 12/13/14 1620 12/14/14 0535  AST 13* 243*  ALT 12* 129*  ALKPHOS 73 78  BILITOT 0.6 0.2*  ALBUMIN 3.1* 2.1*   Cardiac Enzymes  Recent Labs Lab 12/13/14 2156  TROPONINI <0.03   Glucose  Recent Labs Lab 12/14/14 1202 12/14/14 1635 12/14/14 2008 12/15/14 0018 12/15/14 0330 12/15/14 0732  GLUCAP 165* 137* 217* 112* 118* 101*  Imaging No results found.   ASSESSMENT / PLAN:  PULMONARY A: No acute issues P:   Mobilize as able   CARDIOVASCULAR CVL R Subclavian attempted, R Femoral Vein CVC 7/3 A:  Septic Shock: Most likely 2/2 pyelonephritis.  HTN Rel AI P:  Wean pressors as able - accept MAP 60 Lactate normalized  Check cortisol 7/4 Consider stress steroids - no known hx to suspect a/i Hold home anti-HTN Add stress roids  RENAL A:   Acute Renal Failure: Most likely 2/2 sepsis. AGMA Hyperkalemia POST ATN diuresis P:   Serial BMPs consider CT to RO abscess/emphysematous pyelo given renal u/s and ongoing pressor needs, although lactate reassuring and wbc trending down.   Even balance goals, change fluids to 1/2 NS to 125 cc  GASTROINTESTINAL A:   No acute issues P:   PO diet as tol   HEMATOLOGIC A:   Anemia- mild  P:  F/u cbc   INFECTIOUS A:   Multi organism Pyelonephritis: P:   BCx2 7/3>>> Urine 7/2>>> GNR>>>  Vanc 7/2>>> Zosyn 7/2>>>  Check pct  Dc vanc  ENDOCRINE A:   DM   P:   SSI   NEUROLOGIC A:   AMS- improving slowly  MS P:   Holding home meds   FAMILY  - Updates: no family available 7/4  - Inter-disciplinary family meet or Palliative Care meeting due by:  day 7/10    Nickolas Madrid, NP 12/15/2014  9:12 AM Pager: [phone] or [phone]   STAFF NOTE: Linwood Dibbles, MD FACP have personally reviewed patient's available data, including medical history, events of note, physical examination and test results as part of my evaluation. I have discussed with resident/NP and other care providers such as pharmacist, RN and RRT. In addition, I personally evaluated patient and elicited key findings of: remains on pressors, cortisol not adaquate response, add stress roids now, has increase urine output c/w post ATN diuresis, increase fludis rate, may nee bolus, for now, keep even, avoid saline with hyperchloremia, dc vanc with gram neg noted, keep zosyn assess ID and sens of this organims, [place] reviewed, continued pressors get CT The patient is critically ill with multiple organ systems failure and requires high complexity decision making for assessment and support, frequent evaluation and titration of therapies, application of advanced monitoring technologies and extensive interpretation of multiple databases.   Critical Care Time devoted to patient care services described in this note is 30 Minutes. This time reflects time of care of this signee: Merrie Roof, MD FACP. This critical care time does not reflect procedure time, or teaching time or supervisory time of PA/NP/Med student/Med Resident etc but could  involve care discussion time. Rest per NP/medical resident whose note is outlined above and that I agree with   Lavon Paganini. Titus Mould, MD, [place] Pgr: [place] [facility] 12/15/2014 12:51 PM

## 2014-12-15 NOTE — Care Management (Signed)
Important Message  Patient Details  Name: [name] MRN: [id number] Date of Birth: November 21, 1960   Medicare Important Message Given:  Yes-second notification given    Loann Quill 12/15/2014, 9:21 AM

## 2014-12-15 NOTE — Progress Notes (Addendum)
[place] Progress Note Patient Name: [name] DOB: 1960-10-30 MRN: [id number]   Date of Service  12/15/2014  HPI/Events of Note   CT abd - some urterral stranding but shows R CFV DVT  OPatient on sq heparin and aspirin. No bleeding Per RN    eICU Interventions  IV heparin DC sq heparin      Intervention Category Intermediate Interventions: Diagnostic test evaluation  Staysha Truby 12/15/2014, 8:22 PM

## 2014-12-16 ENCOUNTER — Encounter (HOSPITAL_COMMUNITY): Payer: Self-pay | Admitting: General Practice

## 2014-12-16 DIAGNOSIS — N12 Tubulo-interstitial nephritis, not specified as acute or chronic: Secondary | ICD-10-CM

## 2014-12-16 DIAGNOSIS — R6520 Severe sepsis without septic shock: Secondary | ICD-10-CM

## 2014-12-16 LAB — HEPARIN LEVEL (UNFRACTIONATED)
HEPARIN UNFRACTIONATED: 0.35 [IU]/mL (ref 0.30–0.70)
Heparin Unfractionated: 0.3 IU/mL (ref 0.30–0.70)

## 2014-12-16 LAB — CBC WITH DIFFERENTIAL/PLATELET
BASOS ABS: 0 10*3/uL (ref 0.0–0.1)
BASOS PCT: 0 % (ref 0–1)
EOS ABS: 0 10*3/uL (ref 0.0–0.7)
Eosinophils Relative: 0 % (ref 0–5)
HEMATOCRIT: 23 % — AB (ref 36.0–46.0)
HEMOGLOBIN: 7.9 g/dL — AB (ref 12.0–15.0)
Lymphocytes Relative: 18 % (ref 12–46)
Lymphs Abs: 1.4 10*3/uL (ref 0.7–4.0)
MCH: 29.6 pg (ref 26.0–34.0)
MCHC: 34.3 g/dL (ref 30.0–36.0)
MCV: 86.1 fL (ref 78.0–100.0)
MONO ABS: 0.2 10*3/uL (ref 0.1–1.0)
MONOS PCT: 3 % (ref 3–12)
Neutro Abs: 6 10*3/uL (ref 1.7–7.7)
Neutrophils Relative %: 79 % — ABNORMAL HIGH (ref 43–77)
Platelets: 230 10*3/uL (ref 150–400)
RBC: 2.67 MIL/uL — ABNORMAL LOW (ref 3.87–5.11)
RDW: 14.7 % (ref 11.5–15.5)
WBC: 7.6 10*3/uL (ref 4.0–10.5)

## 2014-12-16 LAB — BASIC METABOLIC PANEL
Anion gap: 7 (ref 5–15)
BUN: 13 mg/dL (ref 6–20)
CHLORIDE: 112 mmol/L — AB (ref 101–111)
CO2: 17 mmol/L — ABNORMAL LOW (ref 22–32)
Calcium: 7 mg/dL — ABNORMAL LOW (ref 8.9–10.3)
Creatinine, Ser: 0.86 mg/dL (ref 0.44–1.00)
GFR calc Af Amer: >60 mL/min (ref >60)
GLUCOSE: 126 mg/dL — AB (ref 65–99)
POTASSIUM: 3.4 mmol/L — AB (ref 3.5–5.1)
Sodium: 136 mmol/L (ref 135–145)

## 2014-12-16 LAB — MAGNESIUM: Magnesium: 1 mg/dL — ABNORMAL LOW (ref 1.7–2.4)

## 2014-12-16 LAB — GLUCOSE, CAPILLARY
GLUCOSE-CAPILLARY: 134 mg/dL — AB (ref 65–99)
GLUCOSE-CAPILLARY: 161 mg/dL — AB (ref 65–99)
Glucose-Capillary: 124 mg/dL — ABNORMAL HIGH (ref 65–99)
Glucose-Capillary: 124 mg/dL — ABNORMAL HIGH (ref 65–99)
Glucose-Capillary: 130 mg/dL — ABNORMAL HIGH (ref 65–99)
Glucose-Capillary: 161 mg/dL — ABNORMAL HIGH (ref 65–99)
Glucose-Capillary: 197 mg/dL — ABNORMAL HIGH (ref 65–99)

## 2014-12-16 LAB — PROCALCITONIN: PROCALCITONIN: 0.3 ng/mL

## 2014-12-16 LAB — PHOSPHORUS: PHOSPHORUS: 2.1 mg/dL — AB (ref 2.5–4.6)

## 2014-12-16 MED ORDER — SODIUM PHOSPHATE 3 MMOLE/ML IV SOLN
10.0000 mmol | Freq: Once | INTRAVENOUS | Status: AC
  Administered 2014-12-16: 10 mmol via INTRAVENOUS
  Filled 2014-12-16: qty 3.33

## 2014-12-16 MED ORDER — SODIUM CHLORIDE 0.9 % IV SOLN
6.0000 g | Freq: Once | INTRAVENOUS | Status: DC
  Administered 2014-12-16: 6 g via INTRAVENOUS
  Filled 2014-12-16: qty 12

## 2014-12-16 MED ORDER — POTASSIUM CHLORIDE CRYS ER 20 MEQ PO TBCR
20.0000 meq | EXTENDED_RELEASE_TABLET | ORAL | Status: DC
  Administered 2014-12-16: 20 meq via ORAL
  Filled 2014-12-16: qty 1

## 2014-12-16 MED ORDER — ENSURE ENLIVE PO LIQD
237.0000 mL | Freq: Two times a day (BID) | ORAL | Status: DC
  Administered 2014-12-17 – 2014-12-21 (×6): 237 mL via ORAL

## 2014-12-16 MED ORDER — HYDROCORTISONE NA SUCCINATE PF 100 MG IJ SOLR
50.0000 mg | Freq: Two times a day (BID) | INTRAMUSCULAR | Status: DC
  Administered 2014-12-16 – 2014-12-18 (×4): 50 mg via INTRAVENOUS
  Filled 2014-12-16 (×3): qty 2

## 2014-12-16 MED ORDER — CEFTRIAXONE SODIUM IN DEXTROSE 20 MG/ML IV SOLN
1.0000 g | INTRAVENOUS | Status: DC
  Administered 2014-12-16 – 2014-12-19 (×4): 1 g via INTRAVENOUS
  Filled 2014-12-16 (×4): qty 50

## 2014-12-16 MED ORDER — INSULIN ASPART 100 UNIT/ML ~~LOC~~ SOLN: 0.0000 [IU] | Freq: Every day | SUBCUTANEOUS | Status: DC

## 2014-12-16 MED ORDER — INSULIN ASPART 100 UNIT/ML ~~LOC~~ SOLN
0.0000 [IU] | Freq: Three times a day (TID) | SUBCUTANEOUS | Status: DC
  Administered 2014-12-16 – 2014-12-18 (×4): 3 [IU] via SUBCUTANEOUS
  Administered 2014-12-18: 2 [IU] via SUBCUTANEOUS
  Administered 2014-12-18: 5 [IU] via SUBCUTANEOUS
  Administered 2014-12-19: 3 [IU] via SUBCUTANEOUS
  Administered 2014-12-19: 8 [IU] via SUBCUTANEOUS
  Administered 2014-12-20: 5 [IU] via SUBCUTANEOUS
  Administered 2014-12-20 – 2014-12-21 (×2): 3 [IU] via SUBCUTANEOUS

## 2014-12-16 MED ORDER — POTASSIUM CHLORIDE CRYS ER 20 MEQ PO TBCR
40.0000 meq | EXTENDED_RELEASE_TABLET | Freq: Once | ORAL | Status: AC
  Administered 2014-12-16: 40 meq via ORAL
  Filled 2014-12-16: qty 2

## 2014-12-16 NOTE — Progress Notes (Addendum)
PULMONARY / CRITICAL CARE MEDICINE   Name: [name] MRN: [id number] DOB: 07/01/1960    ADMISSION DATE:  12/13/2014  CHIEF COMPLAINT:  Hypotension  INITIAL PRESENTATION: 62 F with severe MS presenting 7/2 with septic shock in setting of likely pyelonephritis. Initially admitted to SDU.  7/2 had worsening hypotension requiring CVL and tx ICU for pressors.    MAJOR EVENTS/TEST RESULTS: 7/02 Admitted to [facility] service with severe sepsis, urinary tract source 7/03 early AM: transferred to ICU, PCCM consultation and assumption of care 7/03 Renal US: Increased renal echogenicity, suggesting medical renal disease. Left-sided renal calculi. Suggestion of right renal pelvic wall thickening, possibly representing ascending infection. Right renal cyst. Left renal lesion which is likely a cyst or complex cyst. Consider further evaluation with CT to evaluate the above findings. Ideally, this could be performed with and without contrast. If patient's renal function will not allow contrast, stone study could be performed. Foley catheter within the urinary bladder, not decompressed. this suggests catheter dysfunction. Concurrent wall thickening could represent bladder outlet obstruction or cystitis. Cholelithiasis 7/04 CTAP: Ureteric and renal pelvic wall thickening/hyper enhancement, suspicious for ascending infection. Subtle heterogeneous right renal enhancement is suspicious for pyelonephritis. Left nephrolithiasis, include left ureteropelvic junction dominant stone. Relatively mild upstream caliectasis. Nonocclusive thrombus within the left external iliac and common femoral veins.  INDWELLING DEVICES:: R femoral CVL 7/03 >> 7/05  MICRO DATA: MRSA PCR 7/02 >> NEG Urine 7/02 >> proteus  Blood 7/03 >>    ANTIMICROBIALS:  Vanc 7/03 >> 7/04 Pip-tazo 7/03 >> 7/05 Ceftriaxone 7/02 >> 7/03, 7/05 >>    SUBJECTIVE:    Off vasopressors. No complaints. No distress  VITAL SIGNS: Temp:  [97.7 F (36.5  C)-98.6 F (37 C)] 97.8 F (36.6 C) (07/05 1454) Pulse Rate:  [63-90] 78 (07/05 1454) Resp:  [15-22] 16 (07/05 1454) BP: (104-125)/(39-68) 124/63 mmHg (07/05 1454) SpO2:  [99 %-100 %] 100 % (07/05 1454) Weight:  [63.6 kg (140 lb 3.4 oz)-64.683 kg (142 lb 9.6 oz)] 64.683 kg (142 lb 9.6 oz) (07/05 1454) HEMODYNAMICS:   VENTILATOR SETTINGS:   INTAKE / OUTPUT:  Intake/Output Summary (Last 24 hours) at 12/16/14 1516 Last data filed at 12/16/14 1200  Gross per 24 hour  Intake 2701.5 ml  Output   2280 ml  Net  421.5 ml    PHYSICAL EXAMINATION: General:  NAD Neuro: No focal deficits HEENT: WNL  Cardiovascular: reg, no M Lungs: Clear Abdomen: NABS, soft Ext: no edema  LABS:  CBC  Recent Labs Lab 12/14/14 0535 12/15/14 0500 12/16/14 0436  WBC 12.5* 12.3* 7.6  HGB 9.8* 9.2* 7.9*  HCT 28.9* 27.0* 23.0*  PLT 289 255 230   Coag's  Recent Labs Lab 12/13/14 2324 12/14/14 0534 12/14/14 0535  APTT 24 27  --   INR  --   --  1.42   BMET  Recent Labs Lab 12/15/14 0500 12/15/14 1817 12/16/14 0436  NA 137 132* 136  K 3.7 3.9 3.4*  CL 113* 106 112*  CO2 20* 18* 17*  BUN 29* 19 13  CREATININE 1.14* 0.97 0.86  GLUCOSE 122* 173* 126*   Electrolytes  Recent Labs Lab 12/13/14 1620 12/14/14 0535 12/15/14 0500 12/15/14 1817 12/16/14 0436  CALCIUM 10.6* 8.9 8.8* 7.7* 7.0*  MG 2.2 1.7  --   --  1.0*  PHOS 5.6* 3.7  --   --  2.1*   Sepsis Markers  Recent Labs Lab 12/14/14 0535 12/14/14 1130 12/14/14 1650 12/15/14 0500 12/16/14 0436  LATICACIDVEN 1.0 1.3 0.8  --   --   PROCALCITON 2.38  --   --  0.78 0.30   ABG  Recent Labs Lab 12/14/14 0540  PHART 7.261*  PCO2ART 32.0*  PO2ART 98.0   Liver Enzymes  Recent Labs Lab 12/13/14 1620 12/14/14 0535  AST 13* 243*  ALT 12* 129*  ALKPHOS 73 78  BILITOT 0.6 0.2*  ALBUMIN 3.1* 2.1*   Cardiac Enzymes  Recent Labs Lab 12/13/14 2156  TROPONINI <0.03   Glucose  Recent Labs Lab  12/15/14 1618 12/15/14 2028 12/16/14 12/16/14 0430 12/16/14 0845 12/16/14 1155  GLUCAP 164* 236* 161* 130* 124* 161*    CXR: no new film    ASSESSMENT: Baseline: multiple sclerosis - severe impairment. Lived at home PTA Septic Shock, resolved Severe sepsis due to pyelonephritis  Proteus in urine cx Nephrolithiasis   H/O HTN Relative Adrenal insufficiency AKI, resolving Metabolic acidosis-  resolved Hyperkalemia, resolved Asymptomatic cholelithiasis  Anemia, without acute blood loss DM 2, controlled   Acute encephalopathy, resolved Incidental finding of DVT on CTAP  P:   Transfer to med-surg. TRH to assume care as of AM 7/06. Discussed with Dr Sherral Hammers [place] abx to ceftriaxone Might need Urology eval for stones Advance diet to carb/heart  Change SSI to ACHS Taper hydrocortisone to off as permitted by BP - reduced to 50 mg IV q 12 hrs 7/05 Cont heparin per pharmacy - change to oral anticoagulation in next day or two Transition back to home medical regimen as indicated by clinical condition   Merton Border, MD ; [facility] service Mobile [phone].  After 5:30 PM or weekends, call [phone]

## 2014-12-16 NOTE — Progress Notes (Signed)
[facility] ADULT ICU REPLACEMENT PROTOCOL FOR AM LAB REPLACEMENT ONLY  The patient does apply for the [facility] Adult ICU Electrolyte Replacment Protocol based on the criteria listed below:   1. Is GFR >/= 40 ml/min? Yes.    Patient's GFR today is >60 2. Is urine output >/= 0.5 ml/kg/hr for the last 6 hours? Yes.   Patient's UOP is 0.8 ml/kg/hr 3. Is BUN < 60 mg/dL? Yes.    Patient's BUN today is 13 4. Abnormal electrolyte(s): K+3.4 Mg 1.0 Phos 2.1 5. Ordered repletion with: protocol 6. If a panic level lab has been reported, has the CCM MD in charge been notified? Yes.  .   Physician:  E Deterding  Concepcion Living [facility] 12/16/2014 5:16 AM

## 2014-12-16 NOTE — Evaluation (Signed)
Physical Therapy Evaluation Patient Details Name: [name] MRN: [id number] DOB: 07-Feb-1961 Today's Date: 12/16/2014   History of Present Illness  Patient is a 54 y/o female admitted due to [place] and found to have pyelonephritis.  PMH positive for MS and at baseline is wheelchair bound.  Clinical Impression  Patient presents with decreased mobility due to deficits listed in PT problem list.  She will benefit from skilled PT in the acute setting to allow return home with assist.  Patient interested in improving independence with transfers and self care skills and may benefit from CIR rehab stay to maximize independence.  Reports history of stay 4-5 years ago.    Follow Up Recommendations CIR    Equipment Recommendations  Other (comment) (patient reports needs new power chair; has initiated with DME company)    Recommendations for Other Services Rehab consult;OT consult     Precautions / Restrictions Precautions Precautions: Fall      Mobility  Bed Mobility Overal bed mobility: Needs Assistance Bed Mobility: Rolling;Sidelying to Sit;Sit to Sidelying Rolling: Mod assist Sidelying to sit: Max assist     Sit to sidelying: Max assist General bed mobility comments: patient used rail to help some with side to sit, assist for LE"s and trunk to upright  Transfers Overall transfer level: Needs assistance   Transfers: Squat Pivot Transfers     Squat pivot transfers: Max assist     General transfer comment: scooted up to head of bed via squat pivot along edge of bed with max assist  Ambulation/Gait             General Gait Details: patient non-ambulatory  Stairs            Wheelchair Mobility    Modified Rankin (Stroke Patients Only)       Balance Overall balance assessment: Needs assistance   Sitting balance-Leahy Scale: Poor Sitting balance - Comments: min to mod support for sitting balance at edge of bed about 10 minutes     Standing balance-Leahy  Scale: Zero                               Pertinent Vitals/Pain Pain Assessment: No/denies pain    Home Living Family/patient expects to be discharged to:: Private residence Living Arrangements: Spouse/significant other Available Help at Discharge: Family;Friend(s);Available 24 hours/day Type of Home: House Home Access: Ramped entrance     Home Layout: One level Home Equipment: Wheelchair - power;Grab bars - toilet;Other (comment) (rail on bed) Additional Comments: Patient reports boyfriend helps her, but works 3rd shift, has been having friend stay with her at night    Prior Function Level of Independence: Needs assistance   Gait / Transfers Assistance Needed: transfers with assistance to power chair  ADL's / Homemaking Assistance Needed: assist for sponge bath and dressing        Hand Dominance        Extremity/Trunk Assessment   Upper Extremity Assessment: Generalized weakness           Lower Extremity Assessment: LLE deficits/detail;RLE deficits/detail RLE Deficits / Details: some active movement in extensors noted in shortened range at least 2/5, increased tone throughout and shortened heel cords with edema in ankles and feet LLE Deficits / Details: some active movement in extensors noted in shortened range at least 2/5, increased tone throughout and shortened heel cords with edema in ankles and feet     Communication   Communication:  No difficulties  Cognition Arousal/Alertness: Awake/alert Behavior During Therapy: WFL for tasks assessed/performed Overall Cognitive Status: Within Functional Limits for tasks assessed                      General Comments      Exercises Other Exercises Other Exercises: gentle stretching performed knee to chest x 20 second hold x 2 each leg      Assessment/Plan    PT Assessment Patient needs continued PT services  PT Diagnosis Generalized weakness   PT Problem List Decreased strength;Decreased  activity tolerance;Decreased range of motion;Decreased balance;Decreased mobility;Impaired tone  PT Treatment Interventions DME instruction;Balance training;Neuromuscular re-education;Functional mobility training;Patient/family education;Therapeutic activities;Therapeutic exercise   PT Goals (Current goals can be found in the Care Plan section) Acute Rehab PT Goals Patient Stated Goal: To live independent PT Goal Formulation: With patient Time For Goal Achievement: 12/30/14 Potential to Achieve Goals: Fair    Frequency Min 3X/week   Barriers to discharge        Co-evaluation               End of Session Equipment Utilized During Treatment: Gait belt Activity Tolerance: Patient tolerated treatment well Patient left: in bed;with call bell/phone within reach;with bed alarm set           Time: 2956-2130 PT Time Calculation (min) (ACUTE ONLY): 26 min   Charges:   PT Evaluation $Initial PT Evaluation Tier I: 1 Procedure PT Treatments $Therapeutic Activity: 8-22 mins   PT G Codes:        Bodin Gorka,CYNDI 2015/01/04, 4:10 PM  Magda Kiel, [place] 01-04-15

## 2014-12-16 NOTE — Care Management Note (Signed)
Case Management Note  Patient Details  Name: JOANN JORGE MRN: [id number] Date of Birth: Nov 01, 1960  Subjective/Objective:    [name] 1st cousin [phone]  Per Jenene Slicker (neighbor and sometime caregiver - [phone]) pt has not taken any meds, eaten anything or drank anything since Thursday. She is trying to back off and get family more involved in care. Pt lives with Ernestina Patches (no phone number available). compliance is an issue.   Active with [facility].  Has been with [facility] - but not taking back to [facility] program as does not have a consistent care giver.  Lives with Ernestina Patches who is a past boy friend but apparently does not give adequate home care.  Recommending SNF post discharge.  SW consult placed.                 Action/Plan:   Expected Discharge Date:                  Expected Discharge Plan:  [place]  In-House Referral:     Discharge planning Services     Post Acute Care Choice:    Choice offered to:     DME Arranged:    DME Agency:     HH Arranged:    [place] Agency:     Status of Service:  In process, will continue to follow  Medicare Important Message Given:  Yes-second notification given Date Medicare IM Given:    Medicare IM give by:    Date Additional Medicare IM Given:    Additional Medicare Important Message give by:     If discussed at [place] of Stay Meetings, dates discussed:    Additional Comments:  Vergie Living, RN 12/16/2014, 11:58 AM

## 2014-12-16 NOTE — Progress Notes (Signed)
Report called to Daria Pastures, RN @ [phone]. Patient transferred to room 6 North 24.  Will continue to monitor.

## 2014-12-16 NOTE — Progress Notes (Addendum)
ANTICOAGULATION CONSULT NOTE - Follow-up Consult  Pharmacy Consult for Heparin Indication: VTE treatment  No Known Allergies  Patient Measurements: Height: 5\' 6"  (167.6 cm) Weight: 138 lb 0.1 oz (62.6 kg) IBW/kg (Calculated) : 59.3  Vital Signs: Temp: 98.2 F (36.8 C) (07/05 0429) Temp Source: Oral (07/05 0429) BP: 115/54 mmHg (07/05 0300) Pulse Rate: 72 (07/05 0300)  Labs:  Recent Labs  12/13/14 2156 12/13/14 2324 12/14/14 0534 12/14/14 0535 12/15/14 0500 12/15/14 1817 12/16/14 0300 12/16/14 0436  HGB  --   --   --  9.8* 9.2*  --   --  7.9*  HCT  --   --   --  28.9* 27.0*  --   --  23.0*  PLT  --   --   --  289 255  --   --  230  APTT  --  24 27  --   --   --   --   --   LABPROT  --   --   --  17.5*  --   --   --   --   INR  --   --   --  1.42  --   --   --   --   HEPARINUNFRC  --   --   --   --   --   --  0.35  --   CREATININE  --   --   --  2.71* 1.14* 0.97  --   --   TROPONINI <0.03  --   --   --   --   --   --   --     Estimated Creatinine Clearance: 60.6 mL/min (by C-G formula based on Cr of 0.97).   Assessment: 54 yo F presents on 7/2 with multiple complaints and admitted to the ICU. CT of the abdomen showed thrombus within the left external iliac and common femoral veins. Pt on heparin for VTE treatment. Heparin level 0.35 (therapeutic.) Hgb down to 7.9, plt wnl. No bleeding noted.  Goal of Therapy:  Heparin level 0.3-0.7 units/ml Monitor platelets by anticoagulation protocol: Yes   Plan:  Continue heparin gtt at 1,000 units/hr Check 6 hr confirmation heparin level Monitor Hgb closely  Sherlon Handing, PharmD, BCPS Clinical pharmacist, pager [phone] 12/16/2014,5:04 AM   =========================  Addendum: - HL 0.3, therapeutic but trending down - hemoglobin trending down but no overt beleding   Plan: - Increase heparin gtt slightly to 1050 units/hr - F/U AM labs - Monitor closely for s/sx of bleeding    Taggart Prasad D. Mina Marble, PharmD, BCPS Pager:   [phone] 12/16/2014, 3:05 PM

## 2014-12-17 DIAGNOSIS — G9340 Encephalopathy, unspecified: Secondary | ICD-10-CM

## 2014-12-17 DIAGNOSIS — G35 Multiple sclerosis: Secondary | ICD-10-CM

## 2014-12-17 DIAGNOSIS — N200 Calculus of kidney: Secondary | ICD-10-CM

## 2014-12-17 DIAGNOSIS — N390 Urinary tract infection, site not specified: Secondary | ICD-10-CM

## 2014-12-17 LAB — CBC
HEMATOCRIT: 30.1 % — AB (ref 36.0–46.0)
Hemoglobin: 10.3 g/dL — ABNORMAL LOW (ref 12.0–15.0)
MCH: 29.4 pg (ref 26.0–34.0)
MCHC: 34.2 g/dL (ref 30.0–36.0)
MCV: 86 fL (ref 78.0–100.0)
PLATELETS: 355 10*3/uL (ref 150–400)
RBC: 3.5 MIL/uL — ABNORMAL LOW (ref 3.87–5.11)
RDW: 14.8 % (ref 11.5–15.5)
WBC: 10.2 10*3/uL (ref 4.0–10.5)

## 2014-12-17 LAB — COMPREHENSIVE METABOLIC PANEL
ALBUMIN: 2.1 g/dL — AB (ref 3.5–5.0)
ALT: 66 U/L — AB (ref 14–54)
AST: 24 U/L (ref 15–41)
Alkaline Phosphatase: 99 U/L (ref 38–126)
Anion gap: 7 (ref 5–15)
BUN: 8 mg/dL (ref 6–20)
CALCIUM: 8.4 mg/dL — AB (ref 8.9–10.3)
CO2: 21 mmol/L — ABNORMAL LOW (ref 22–32)
Chloride: 110 mmol/L (ref 101–111)
Creatinine, Ser: 0.75 mg/dL (ref 0.44–1.00)
GFR calc Af Amer: >60 mL/min (ref >60)
GFR calc non Af Amer: >60 mL/min (ref >60)
Glucose, Bld: 167 mg/dL — ABNORMAL HIGH (ref 65–99)
Potassium: 4 mmol/L (ref 3.5–5.1)
SODIUM: 138 mmol/L (ref 135–145)
Total Bilirubin: 0.3 mg/dL (ref 0.3–1.2)
Total Protein: 5.8 g/dL — ABNORMAL LOW (ref 6.5–8.1)

## 2014-12-17 LAB — GLUCOSE, CAPILLARY
Glucose-Capillary: 196 mg/dL — ABNORMAL HIGH (ref 65–99)
Glucose-Capillary: 172 mg/dL — ABNORMAL HIGH (ref 65–99)

## 2014-12-17 LAB — MAGNESIUM: Magnesium: 1.8 mg/dL (ref 1.7–2.4)

## 2014-12-17 LAB — HEPARIN LEVEL (UNFRACTIONATED): Heparin Unfractionated: 0.14 IU/mL — ABNORMAL LOW (ref 0.30–0.70)

## 2014-12-17 MED ORDER — LISINOPRIL 5 MG PO TABS
2.5000 mg | ORAL_TABLET | Freq: Every day | ORAL | Status: DC
  Administered 2014-12-18 – 2014-12-21 (×4): 2.5 mg via ORAL
  Filled 2014-12-17 (×4): qty 1

## 2014-12-17 MED ORDER — HEPARIN BOLUS VIA INFUSION
2500.0000 [IU] | Freq: Once | INTRAVENOUS | Status: AC
  Administered 2014-12-17: 2500 [IU] via INTRAVENOUS
  Filled 2014-12-17: qty 2500

## 2014-12-17 NOTE — Clinical Social Work Note (Signed)
Clinical Social Work Assessment  Patient Details  Name: [name] MRN: [id number] Date of Birth: [birth year]  Date of referral:  12/17/14               Reason for consult:  Discharge Planning, Facility Placement                Permission sought to share information with:  Family Supports, Chartered certified accountant granted to share information::  Yes, Verbal Permission Granted  Name::     [name]  Agency::  [facility] SNF (preference: [name])  Relationship::  Cousin  Contact Information:  [phone]  Housing/Transportation Living arrangements for the past 2 months:  [place] of Information:  Other (Comment Required) (Family: Patient cousin) Patient Interpreter Needed:  None Criminal Activity/Legal Involvement Pertinent to Current Situation/Hospitalization:  No - Comment as needed Significant Relationships:  Other Family Members, Siblings Lives with:  Friends Do you feel safe going back to the place where you live?  No (Patient's cousin feels SNF will be best discharge disposition for patient.) Need for family participation in patient care:  Yes (Comment) (Per MD note, patient with confusion. Patient's first cousin is family member who cares for patient.)  Care giving concerns:  Patient's cousin expressed no concerns regarding discharge planning.   Social Worker assessment / plan:  CSW received referral for possible SNF placement at time of discharge. Per chart review, patient with confusion and unable to participate in discharge planning. CSW contacted patient's cousin, [name], regarding discharge disposition planning. Patient's cousin understanding of PT recommendation for short-term rehabilitation at time of discharge. Per patient's cousin, patient has previously completed short-term rehabilitation at [facility]. Patient's cousin states first choice in SNF placement is [name] and second choice is [facility]. CSW to continue to follow and assist with discharge planning needs.  Employment status:  Other (Comment) (Employment not disclosed by patient's cousin.) Insurance information:  Programmer, applications ([employer]) PT Recommendations:  Inpatient Rehab Consult Information / Referral to community resources:  [place]  Patient/Family's Response to care:  Patient's cousin understanding and agreeable to CSW plan of care.  Patient/Family's Understanding of and Emotional Response to Diagnosis, Current Treatment, and Prognosis:  Patient's cousin understanding and agreeable to CSW plan of care.  Emotional Assessment Appearance:  Other (Comment Required (CSW spoke with patient's cousin as patient with confusion.) Attitude/Demeanor/Rapport:  Other (CSW spoke wth patient's cousin as patient with confusion.) Affect (typically observed):  Other (CSW spoke with patient's cousin as patient with confusion.) Orientation:  Oriented to Place, Oriented to Self Alcohol / Substance use:  Not Applicable Psych involvement (Current and /or in the community):  No (Comment) (Not appropriate on this admission.)  Discharge Needs  Concerns to be addressed:  No discharge needs identified Readmission within the last 30 days:  No Current discharge risk:  None Barriers to Discharge:  No Barriers Identified   Caroline Sauger, LCSW 12/17/2014, 12:48 PM [phone]

## 2014-12-17 NOTE — Progress Notes (Signed)
Rehab Admissions Coordinator Note:  Patient was screened by Retta Diones for appropriateness for an Inpatient Acute Rehab Consult.  At this time, we are recommending [place].  It is doubtful that [facility] would pay for inpatient rehab given current diagnosis.  However, If attending wishes to try to pursue inpatient rehab, then can order a rehab consult.  Call me for questions.  Jodell Cipro M 12/17/2014, 8:26 AM  I can be reached at [phone].

## 2014-12-17 NOTE — Progress Notes (Signed)
PROGRESS NOTE  [name] TIR:[id number] DOB: 1960/10/28 DOA: 12/13/2014 PCP: [name], MD  HPI/Recap of past 24 hours:  Reported feeling better, denies pain.  Confused, oriented x3 but not oriented to situation and poor memory. She has been refusing labs this am, after explained to her the reason of getting heparin drip and labs, she agrees to have the labs done. Not able to reach family contact at [phone], left massage for them to call back.  Assessment/Plan: Principal Problem:   At high risk for severe sepsis Active Problems:   Multiple sclerosis diagnosed 2002-not on Therapy any longer   Diabetes mellitus   High blood pressure   AKI (acute kidney injury)   Metabolic acidosis   Sepsis   Septic shock   Pressure ulcer   Acute renal failure  Septic shock secondary to uti/pyelonephritis: required pressor support and icu care from 7/3 tp 7/5. Improving, transferred to hospitalist service on 7/6. Continue abx, taper stress dose steroids. Blood culture negative  Proteus uti/phelonephritis/ARF/left upj stone/chronic neurogenic  bladder: cr back to normal, on rocephin, foley in place, urology consulted, Dr. Matilde Sprang will see patient today on 7/6.   Incidental finding of DVT currently on heparin drip, will transition to oral anticoagulation if no urology procedure, awaiting urology input.   Metabolic encephalopathy: improving, oriented x3,  But confused, poor memory, not oriented to situation, refusing labs. Need constant redirection.  Per cousin Vista Mink, no confusion at baseline.  Electrolytes abnormalities: replace k, mag.  Elevated transaminases:with normal alk phos, likely from sepsis/septic shock, does has cholelithiasis on CT ab, but no symptom. Hold statin for now. Likely able to restart at discharge.  noninsulin dependent diabetes: metformin/glipizide held, on ssi here.  HTN: bp meds held due to sepsis, will restart lisinopril from 7/7. Continue taper  steroids.  H/o MS, wheelchair bound for 5years, bowel and bladder incontinence, will need cir vs snf  Code Status: full  Family Communication: patient's cousin Vista Mink in room. Family agree with placement.  Vista Mink : [phone] (cell) Isaiah [phone] (cell)  Disposition Plan: CIR vs SNF   INDWELLING DEVICES:: R femoral CVL 7/03 >> 7/05  MICRO DATA: MRSA PCR 7/02 >> NEG Urine 7/02 >> proteus  Blood 7/03 >>    ANTIMICROBIALS:  Vanc 7/03 >> 7/04 Pip-tazo 7/03 >> 7/05 Ceftriaxone 7/02 >> 7/03, 7/05 >>   Consultants:  pccm  urology    Objective: BP 142/67 mmHg  Pulse 87  Temp(Src) 97.4 F (36.3 C) (Oral)  Resp 18  Ht '5\' 6"'  (1.676 m)  Wt 64.683 kg (142 lb 9.6 oz)  BMI 23.03 kg/m2  SpO2 100%  Intake/Output Summary (Last 24 hours) at 12/17/14 1122 Last data filed at 12/17/14 0700  Gross per 24 hour  Intake 270.81 ml  Output    200 ml  Net  70.81 ml   Filed Weights   12/15/14 0606 12/16/14 0500 12/16/14 1454  Weight: 62.6 kg (138 lb 0.1 oz) 63.6 kg (140 lb 3.4 oz) 64.683 kg (142 lb 9.6 oz)    Exam:   General:  NAD  Cardiovascular: RRR  Respiratory: CTABL  Abdomen: Soft/ND/NT, positive BS  Musculoskeletal: No Edema  Neuro:   Data Reviewed: Basic Metabolic Panel:  Recent Labs Lab 12/13/14 1620 12/14/14 0535 12/15/14 0500 12/15/14 1817 12/16/14 0436  NA 141 144 137 132* 136  K 5.7* 4.1 3.7 3.9 3.4*  CL 108 119* 113* 106 112*  CO2 18* 17* 20*  18* 17*  GLUCOSE 74 155* 122* 173* 126*  BUN 118* 79* 29* 19 13  CREATININE 4.74* 2.71* 1.14* 0.97 0.86  CALCIUM 10.6* 8.9 8.8* 7.7* 7.0*  MG 2.2 1.7  --   --  1.0*  PHOS 5.6* 3.7  --   --  2.1*   Liver Function Tests:  Recent Labs Lab 12/13/14 1620 12/14/14 0535  AST 13* 243*  ALT 12* 129*  ALKPHOS 73 78  BILITOT 0.6 0.2*  PROT 9.0* 5.7*  ALBUMIN 3.1* 2.1*   No results for input(s): LIPASE, AMYLASE in the last 168 hours. No results for input(s): AMMONIA in  the last 168 hours. CBC:  Recent Labs Lab 12/13/14 1620 12/14/14 0535 12/15/14 0500 12/16/14 0436  WBC 18.0* 12.5* 12.3* 7.6  NEUTROABS 14.4* 9.3*  --  6.0  HGB 12.2 9.8* 9.2* 7.9*  HCT 35.5* 28.9* 27.0* 23.0*  MCV 86.6 86.3 85.4 86.1  PLT 321 289 255 230   Cardiac Enzymes:    Recent Labs Lab 12/13/14 2156  TROPONINI <0.03   BNP (last 3 results)  Recent Labs  12/13/14 1620  BNP 56.9    ProBNP (last 3 results) No results for input(s): PROBNP in the last 8760 hours.  CBG:  Recent Labs Lab 12/16/14 0430 12/16/14 0845 12/16/14 1155 12/16/14 1658 12/16/14 2153  GLUCAP 130* 124* 161* 197* 124*    Recent Results (from the past 240 hour(s))  Urine culture     Status: None   Collection Time: 12/13/14  4:45 PM  Result Value Ref Range Status   Specimen Description URINE, CATHETERIZED  Final   Special Requests NONE  Final   Culture >=100,000 COLONIES/mL PROTEUS MIRABILIS  Final   Report Status 12/15/2014 FINAL  Final   Organism ID, Bacteria PROTEUS MIRABILIS  Final      Susceptibility   Proteus mirabilis - MIC*    AMPICILLIN >=32 RESISTANT Resistant     CEFAZOLIN <=4 SENSITIVE Sensitive     CEFTRIAXONE <=1 SENSITIVE Sensitive     CIPROFLOXACIN >=4 RESISTANT Resistant     GENTAMICIN <=1 SENSITIVE Sensitive     IMIPENEM 1 SENSITIVE Sensitive     NITROFURANTOIN 128 RESISTANT Resistant     TRIMETH/SULFA >=320 RESISTANT Resistant     AMPICILLIN/SULBACTAM >=32 RESISTANT Resistant     PIP/TAZO <=4 SENSITIVE Sensitive     * >=100,000 COLONIES/mL PROTEUS MIRABILIS  Gram stain     Status: None   Collection Time: 12/13/14  4:45 PM  Result Value Ref Range Status   Specimen Description URINE, CATHETERIZED  Final   Special Requests NONE  Final   Gram Stain   Final    WBC SEEN POLYMORPHONUCLEAR GRAM POSITIVE COCCI GRAM NEGATIVE RODS CYTOSPUN SLIDE CALLED RN B JACOBELLI AT [phone] [place]    Report Status 12/14/2014 FINAL  Final  MRSA PCR Screening      Status: None   Collection Time: 12/13/14  9:45 PM  Result Value Ref Range Status   MRSA by PCR NEGATIVE NEGATIVE Final    Comment:        The GeneXpert MRSA Assay (FDA approved for NASAL specimens only), is one component of a comprehensive MRSA colonization surveillance program. It is not intended to diagnose MRSA infection nor to guide or monitor treatment for MRSA infections.   Culture, blood (routine x 2)     Status: None (Preliminary result)   Collection Time: 12/14/14  5:35 AM  Result Value Ref Range Status   Specimen Description BLOOD CENTRAL  LINE  Final   Special Requests BOTTLES DRAWN AEROBIC AND ANAEROBIC 10CC EACH  Final   Culture NO GROWTH 2 DAYS  Final   Report Status PENDING  Incomplete  Culture, blood (routine x 2)     Status: None (Preliminary result)   Collection Time: 12/14/14  5:35 AM  Result Value Ref Range Status   Specimen Description BLOOD CENTRAL LINE  Final   Special Requests BOTTLES DRAWN AEROBIC AND ANAEROBIC 10CC EACH  Final   Culture NO GROWTH 2 DAYS  Final   Report Status PENDING  Incomplete     Studies: No results found.  Scheduled Meds: . aspirin EC  81 mg Oral Daily  . cefTRIAXone (ROCEPHIN)  IV  1 g Intravenous Q24H  . feeding supplement (ENSURE ENLIVE)  237 mL Oral BID BM  . hydrocortisone sodium succinate  50 mg Intravenous Q12H  . insulin aspart  0-15 Units Subcutaneous TID WC  . insulin aspart  0-5 Units Subcutaneous QHS  . sodium chloride  3 mL Intravenous Q12H    Continuous Infusions: . heparin 1,050 Units/hr (12/16/14 1658)     Time spent: 64mns  Demyah Smyre MD, PhD  [facility] Pager [phone] If 7PM-7AM, please contact night-coverage at [email or web address], password [facility]7/11/2014, 11:22 AM  LOS: 4 days

## 2014-12-17 NOTE — Progress Notes (Signed)
Initial Nutrition Assessment  DOCUMENTATION CODES:  Not applicable  INTERVENTION:  Ensure Enlive (each supplement provides 350kcal and 20 grams of protein)  NUTRITION DIAGNOSIS:  Predicted suboptimal nutrient intake related to lethargy/confusion, social / environmental circumstances as evidenced by meal completion < 50%.   GOAL:  Patient will meet greater than or equal to 90% of their needs   MONITOR:  PO intake, Supplement acceptance, Labs, Weight trends, Skin, I & O's  REASON FOR ASSESSMENT:  Malnutrition Screening Tool    ASSESSMENT: 64 F with severe MS presenting 7/2 with septic shock in setting of likely pyelonephritis. Initially admitted to SDU. 7/2 had worsening hypotension requiring CVL and tx ICU for pressors.   Attempted to examine pt x 2, however, pt was in with MD at times of visits.   Pt admitted with septic shock; transferred from ICU to surgical floor.   Per chart review, pt was likely not being provided adequate care at home. Neighbor/caregivers reported that pt had no eaten anything since Thursday PTA. Pt is currently on a Heart Healthy/ Carb modified diet; noted 50% meal completion. Will continue order of Ensure BID to promote nutritional adequacy.   Wt hx is limited, but demonstrates a 66# wt loss over the past 1.5 years.   CSW following; potential d/c to SNF once medically stable.  Height:  Ht Readings from Last 1 Encounters:  12/16/14 5\' 6"  (1.676 m)    Weight:  Wt Readings from Last 1 Encounters:  12/16/14 142 lb 9.6 oz (64.683 kg)    Ideal Body Weight:  59 kg  Wt Readings from Last 10 Encounters:  12/16/14 142 lb 9.6 oz (64.683 kg)  06/30/12 202 lb 6.1 oz (91.8 kg)    BMI:  Body mass index is 23.03 kg/(m^2).  Estimated Nutritional Needs:  Kcal:  1700-1900  Protein:  75-85 grams  Fluid:  1.7-1.9 L  Skin:  Wound (see comment) (stage I pressure ulcer on medial buttocks)  Diet Order:  Diet heart healthy/carb modified Room  service appropriate?: Yes; Fluid consistency:: Thin  EDUCATION NEEDS:  No education needs identified at this time   Intake/Output Summary (Last 24 hours) at 12/17/14 1453 Last data filed at 12/17/14 0700  Gross per 24 hour  Intake    240 ml  Output      0 ml  Net    240 ml    Last BM:  12/16/14  Jaimon Bugaj A. Jimmye Norman, RD, LDN, CDE Pager: [phone] After hours Pager: [phone]

## 2014-12-17 NOTE — Progress Notes (Signed)
PHARMACY NOTE  Pharmacy Consult :  54 y.o. female is currently on a Heparin infusion for Left Iliac and femoral vein thrombus. .  Indication :  DVT  Heparin Dosing Wt :  64.7 kg  LABS :  Recent Labs  12/15/14 0500 12/15/14 1817 12/16/14 0300 12/16/14 0436 12/16/14 1109 12/17/14 1200 12/17/14 1632  HGB 9.2*  --   --  7.9*  --  10.3*  --   HCT 27.0*  --   --  23.0*  --  30.1*  --   PLT 255  --   --  230  --  355  --   HEPARINUNFRC  --   --  0.35  --  0.30  --  0.14*  CREATININE 1.14* 0.97  --  0.86  --   --   --    Current Infusion[s]: Infusions:  . heparin 1,050 Units/hr (12/17/14 1528)   ASSESSMENT :  54 y.o. female is currently on Heparin infusion for L-DVT   Heparin infusing at 1050 units/hr. Heparin level low at 0.14 units/ml.  No evidence of bleeding complications observed.  GOAL : Heparin Level  0.3 - 0.7 units/ml  PLAN : 1. Heparin bolus 2500 units IV now, then increase Heparin infusion to 1250 units/hr.  The next Heparin Level will be drawn in 6 hours.    2. Daily Heparin level, CBC while on Heparin.  Monitor for bleeding complications. Follow Platelet counts.  Marthenia Rolling,  Pharm.D   12/17/2014,  6:29 PM

## 2014-12-17 NOTE — Care Management Note (Signed)
Case Management Note  Patient Details  Name: [name] MRN: [id number] Date of Birth: 1961-05-04  Subjective/Objective:                    Action/Plan:  Patient was active with [facility] prior to admission . If patient returns home will need resumption of care orders . Mary with [facility] requesting [facility] order . [facility] consult entered .  Expected Discharge Date:                  Expected Discharge Plan:  [place]  In-House Referral:     Discharge planning Services     Post Acute Care Choice:    Choice offered to:     DME Arranged:    DME Agency:     HH Arranged:    [place] Agency:     Status of Service:  In process, will continue to follow  Medicare Important Message Given:  Yes-second notification given Date Medicare IM Given:    Medicare IM give by:    Date Additional Medicare IM Given:    Additional Medicare Important Message give by:     If discussed at [place] of Stay Meetings, dates discussed:    Additional Comments:  Marilu Favre, RN 12/17/2014, 12:45 PM

## 2014-12-17 NOTE — Progress Notes (Signed)
Pt refused lab draw 2x this am for BMP, CBC and heparin level, pt is noted to be confuse, reported to K. Schorr with order to continue on current dose of Heparin drip at 10.55ml/hr, called pharmacy as well to inform.

## 2014-12-17 NOTE — Consult Note (Signed)
Urology Consult  Referring physician: Dillard Cannon Reason for referral: Renal stone  Chief Complaint: Renal stone  History of Present Illness: 54 year old woman admitted 12/13/2014 with septic shock and likely pyelo; postive urine c/s; u/sound noted renal stone; developed DVT on heparin; now on floor on cetiaxone;  CT July 4th: 1.4 cm stone lower pole left kidney and 1.6 cm stone left upjx and mild caliectasis; bilateral upper ureter and renal pelvis enhancement; heterogenous rt kidney noted suggestive of pyelo; impressive left renal sinus cyst noted 5.X 3.4 cm; urine c/s positive for proteus; blood c/s normal Serum Cr .86  No pain complaint Appears to be confused ongoing Denies UTI/stones/GU surgery Moderate LUTS Modifying factors: There are no other modifying factors  Associated signs and symptoms: There are no other associated signs and symptoms Aggravating and relieving factors: There are no other aggravating or relieving factors Severity: Moderate but improved Duration: Persistant      Past Medical History  Diagnosis Date  . MS (multiple sclerosis)   . Hyperglycemia   . Edema   . High blood pressure   . Diabetes mellitus without complication   . Pyelonephritis 12/16/2014   Past Surgical History  Procedure Laterality Date  . Hernia mesh removal    . Tubes tided    . Tubal ligation      Medications: I have reviewed the patient's current medications. Allergies: No Known Allergies  Family History  Problem Relation Age of Onset  . Diabetes Mother   . Diabetes Father   . Diabetes Sister   . Scoliosis Sister   . Alzheimer's disease Mother    Social History:  reports that she quit smoking about 4 weeks ago. Her smoking use included Cigarettes. She has a 20 pack-year smoking history. She has never used smokeless tobacco. She reports that she drinks about 3.0 oz of alcohol per week. She reports that she does not use illicit drugs.  ROS: All systems are reviewed and negative  except as noted. Rest negative  Physical Exam:  Vital signs in last 24 hours: Temp:  [97.4 F (36.3 C)-98.6 F (37 C)] 98.6 F (37 C) (07/06 1645) Pulse Rate:  [75-87] 79 (07/06 1645) Resp:  [17-18] 17 (07/06 1645) BP: (112-142)/(54-73) 130/64 mmHg (07/06 1645) SpO2:  [99 %-100 %] 100 % (07/06 1645)  Cardiovascular: Skin warm; not flushed Respiratory: Breaths quiet; no shortness of breath Abdomen: No masses Neurological: Normal sensation to touch Musculoskeletal: Normal motor function arms and legs Lymphatics: No inguinal adenopathy Skin: No rashes Genitourinary:no palpable bladder and foley in place  Laboratory Data:  Results for orders placed or performed during the hospital encounter of 12/13/14 (from the past 72 hour(s))  Glucose, capillary     Status: Abnormal   Collection Time: 12/14/14  8:08 PM  Result Value Ref Range   Glucose-Capillary 217 (H) 65 - 99 mg/dL   Comment 1 Notify RN   Glucose, capillary     Status: Abnormal   Collection Time: 12/15/14 12:18 AM  Result Value Ref Range   Glucose-Capillary 112 (H) 65 - 99 mg/dL   Comment 1 Notify RN   Glucose, capillary     Status: Abnormal   Collection Time: 12/15/14  3:30 AM  Result Value Ref Range   Glucose-Capillary 118 (H) 65 - 99 mg/dL   Comment 1 Notify RN   Procalcitonin     Status: None   Collection Time: 12/15/14  5:00 AM  Result Value Ref Range   Procalcitonin 0.78 ng/mL  Comment:        Interpretation: PCT > 0.5 ng/mL and <= 2 ng/mL: Systemic infection (sepsis) is possible, but other conditions are known to elevate PCT as well. (NOTE)         ICU PCT Algorithm               Non ICU PCT Algorithm    ----------------------------     ------------------------------         PCT < 0.25 ng/mL                 PCT < 0.1 ng/mL     Stopping of antibiotics            Stopping of antibiotics       strongly encouraged.               strongly encouraged.    ----------------------------      ------------------------------       PCT level decrease by               PCT < 0.25 ng/mL       >= 80% from peak PCT       OR PCT 0.25 - 0.5 ng/mL          Stopping of antibiotics                                             encouraged.     Stopping of antibiotics           encouraged.    ----------------------------     ------------------------------       PCT level decrease by              PCT >= 0.25 ng/mL       < 80% from peak PCT        AND PCT >= 0.5 ng/mL             Continuing antibiotics                                              encouraged.       Continuing antibiotics            encouraged.    ----------------------------     ------------------------------     PCT level increase compared          PCT > 0.5 ng/mL         with peak PCT AND          PCT >= 0.5 ng/mL             Escalation of antibiotics                                          strongly encouraged.      Escalation of antibiotics        strongly encouraged.   CBC     Status: Abnormal   Collection Time: 12/15/14  5:00 AM  Result Value Ref Range   WBC 12.3 (H) 4.0 - 10.5 K/uL   RBC 3.16 (L) 3.87 - 5.11 MIL/uL   Hemoglobin 9.2 (L) 12.0 -  15.0 g/dL   HCT 27.0 (L) 36.0 - 46.0 %   MCV 85.4 78.0 - 100.0 fL   MCH 29.1 26.0 - 34.0 pg   MCHC 34.1 30.0 - 36.0 g/dL   RDW 14.8 11.5 - 15.5 %   Platelets 255 150 - 400 K/uL  Basic metabolic panel     Status: Abnormal   Collection Time: 12/15/14  5:00 AM  Result Value Ref Range   Sodium 137 135 - 145 mmol/L    Comment: DELTA CHECK NOTED   Potassium 3.7 3.5 - 5.1 mmol/L   Chloride 113 (H) 101 - 111 mmol/L   CO2 20 (L) 22 - 32 mmol/L   Glucose, Bld 122 (H) 65 - 99 mg/dL   BUN 29 (H) 6 - 20 mg/dL   Creatinine, Ser 1.14 (H) 0.44 - 1.00 mg/dL    Comment: DELTA CHECK NOTED   Calcium 8.8 (L) 8.9 - 10.3 mg/dL   GFR calc non Af Amer 53 (L) >60 mL/min   GFR calc Af Amer >60 >60 mL/min    Comment: (NOTE) The eGFR has been calculated using the CKD EPI equation. This  calculation has not been validated in all clinical situations. eGFR's persistently <60 mL/min signify possible Chronic Kidney Disease.    Anion gap 4 (L) 5 - 15  Glucose, capillary     Status: Abnormal   Collection Time: 12/15/14  7:32 AM  Result Value Ref Range   Glucose-Capillary 101 (H) 65 - 99 mg/dL  Glucose, capillary     Status: Abnormal   Collection Time: 12/15/14 11:27 AM  Result Value Ref Range   Glucose-Capillary 134 (H) 65 - 99 mg/dL  Cortisol     Status: None   Collection Time: 12/15/14 11:50 AM  Result Value Ref Range   Cortisol, Plasma 17.0 ug/dL    Comment: (NOTE) AM    6.7 - 22.6 ug/dL PM   <10.0       ug/dL   Glucose, capillary     Status: Abnormal   Collection Time: 12/15/14  4:18 PM  Result Value Ref Range   Glucose-Capillary 164 (H) 65 - 99 mg/dL   Comment 1 Notify RN    Comment 2 Document in Chart   Basic metabolic panel     Status: Abnormal   Collection Time: 12/15/14  6:17 PM  Result Value Ref Range   Sodium 132 (L) 135 - 145 mmol/L   Potassium 3.9 3.5 - 5.1 mmol/L   Chloride 106 101 - 111 mmol/L   CO2 18 (L) 22 - 32 mmol/L   Glucose, Bld 173 (H) 65 - 99 mg/dL   BUN 19 6 - 20 mg/dL   Creatinine, Ser 0.97 0.44 - 1.00 mg/dL   Calcium 7.7 (L) 8.9 - 10.3 mg/dL   GFR calc non Af Amer >60 >60 mL/min   GFR calc Af Amer >60 >60 mL/min    Comment: (NOTE) The eGFR has been calculated using the CKD EPI equation. This calculation has not been validated in all clinical situations. eGFR's persistently <60 mL/min signify possible Chronic Kidney Disease.    Anion gap 8 5 - 15  Glucose, capillary     Status: Abnormal   Collection Time: 12/15/14  8:28 PM  Result Value Ref Range   Glucose-Capillary 236 (H) 65 - 99 mg/dL  Glucose, capillary     Status: Abnormal   Collection Time: 12/16/14 12:00 AM  Result Value Ref Range   Glucose-Capillary 161 (H) 65 - 99 mg/dL  Comment 1 Notify RN    Comment 2 Document in Chart   Heparin level (unfractionated)      Status: None   Collection Time: 12/16/14  3:00 AM  Result Value Ref Range   Heparin Unfractionated 0.35 0.30 - 0.70 IU/mL    Comment:        IF HEPARIN RESULTS ARE BELOW EXPECTED VALUES, AND PATIENT DOSAGE HAS BEEN CONFIRMED, SUGGEST FOLLOW UP TESTING OF ANTITHROMBIN III LEVELS.   Glucose, capillary     Status: Abnormal   Collection Time: 12/16/14  4:30 AM  Result Value Ref Range   Glucose-Capillary 130 (H) 65 - 99 mg/dL   Comment 1 Notify RN    Comment 2 Document in Chart   Procalcitonin     Status: None   Collection Time: 12/16/14  4:36 AM  Result Value Ref Range   Procalcitonin 0.30 ng/mL    Comment:        Interpretation: PCT (Procalcitonin) <= 0.5 ng/mL: Systemic infection (sepsis) is not likely. Local bacterial infection is possible. (NOTE)         ICU PCT Algorithm               Non ICU PCT Algorithm    ----------------------------     ------------------------------         PCT < 0.25 ng/mL                 PCT < 0.1 ng/mL     Stopping of antibiotics            Stopping of antibiotics       strongly encouraged.               strongly encouraged.    ----------------------------     ------------------------------       PCT level decrease by               PCT < 0.25 ng/mL       >= 80% from peak PCT       OR PCT 0.25 - 0.5 ng/mL          Stopping of antibiotics                                             encouraged.     Stopping of antibiotics           encouraged.    ----------------------------     ------------------------------       PCT level decrease by              PCT >= 0.25 ng/mL       < 80% from peak PCT        AND PCT >= 0.5 ng/mL            Continuin g antibiotics                                              encouraged.       Continuing antibiotics            encouraged.    ----------------------------     ------------------------------     PCT level increase compared          PCT > 0.5 ng/mL  with peak PCT AND          PCT >= 0.5 ng/mL              Escalation of antibiotics                                          strongly encouraged.      Escalation of antibiotics        strongly encouraged.   CBC with Differential/Platelet     Status: Abnormal   Collection Time: 12/16/14  4:36 AM  Result Value Ref Range   WBC 7.6 4.0 - 10.5 K/uL   RBC 2.67 (L) 3.87 - 5.11 MIL/uL   Hemoglobin 7.9 (L) 12.0 - 15.0 g/dL   HCT 23.0 (L) 36.0 - 46.0 %   MCV 86.1 78.0 - 100.0 fL   MCH 29.6 26.0 - 34.0 pg   MCHC 34.3 30.0 - 36.0 g/dL   RDW 14.7 11.5 - 15.5 %   Platelets 230 150 - 400 K/uL   Neutrophils Relative % 79 (H) 43 - 77 %   Neutro Abs 6.0 1.7 - 7.7 K/uL   Lymphocytes Relative 18 12 - 46 %   Lymphs Abs 1.4 0.7 - 4.0 K/uL   Monocytes Relative 3 3 - 12 %   Monocytes Absolute 0.2 0.1 - 1.0 K/uL   Eosinophils Relative 0 0 - 5 %   Eosinophils Absolute 0.0 0.0 - 0.7 K/uL   Basophils Relative 0 0 - 1 %   Basophils Absolute 0.0 0.0 - 0.1 K/uL  Magnesium     Status: Abnormal   Collection Time: 12/16/14  4:36 AM  Result Value Ref Range   Magnesium 1.0 (L) 1.7 - 2.4 mg/dL  Phosphorus     Status: Abnormal   Collection Time: 12/16/14  4:36 AM  Result Value Ref Range   Phosphorus 2.1 (L) 2.5 - 4.6 mg/dL  Basic metabolic panel     Status: Abnormal   Collection Time: 12/16/14  4:36 AM  Result Value Ref Range   Sodium 136 135 - 145 mmol/L   Potassium 3.4 (L) 3.5 - 5.1 mmol/L   Chloride 112 (H) 101 - 111 mmol/L   CO2 17 (L) 22 - 32 mmol/L   Glucose, Bld 126 (H) 65 - 99 mg/dL   BUN 13 6 - 20 mg/dL   Creatinine, Ser 0.86 0.44 - 1.00 mg/dL   Calcium 7.0 (L) 8.9 - 10.3 mg/dL   GFR calc non Af Amer >60 >60 mL/min   GFR calc Af Amer >60 >60 mL/min    Comment: (NOTE) The eGFR has been calculated using the CKD EPI equation. This calculation has not been validated in all clinical situations. eGFR's persistently <60 mL/min signify possible Chronic Kidney Disease.    Anion gap 7 5 - 15  Glucose, capillary     Status: Abnormal   Collection Time:  12/16/14  8:45 AM  Result Value Ref Range   Glucose-Capillary 124 (H) 65 - 99 mg/dL  Heparin level (unfractionated)     Status: None   Collection Time: 12/16/14 11:09 AM  Result Value Ref Range   Heparin Unfractionated 0.30 0.30 - 0.70 IU/mL    Comment:        IF HEPARIN RESULTS ARE BELOW EXPECTED VALUES, AND PATIENT DOSAGE HAS BEEN CONFIRMED, SUGGEST FOLLOW UP TESTING OF ANTITHROMBIN III LEVELS.   Glucose, capillary  Status: Abnormal   Collection Time: 12/16/14 11:55 AM  Result Value Ref Range   Glucose-Capillary 161 (H) 65 - 99 mg/dL  Glucose, capillary     Status: Abnormal   Collection Time: 12/16/14  4:58 PM  Result Value Ref Range   Glucose-Capillary 197 (H) 65 - 99 mg/dL  Glucose, capillary     Status: Abnormal   Collection Time: 12/16/14  9:53 PM  Result Value Ref Range   Glucose-Capillary 124 (H) 65 - 99 mg/dL  CBC     Status: Abnormal   Collection Time: 12/17/14 12:00 PM  Result Value Ref Range   WBC 10.2 4.0 - 10.5 K/uL   RBC 3.50 (L) 3.87 - 5.11 MIL/uL   Hemoglobin 10.3 (L) 12.0 - 15.0 g/dL    Comment: REPEATED TO VERIFY   HCT 30.1 (L) 36.0 - 46.0 %   MCV 86.0 78.0 - 100.0 fL   MCH 29.4 26.0 - 34.0 pg   MCHC 34.2 30.0 - 36.0 g/dL   RDW 14.8 11.5 - 15.5 %   Platelets 355 150 - 400 K/uL  Glucose, capillary     Status: Abnormal   Collection Time: 12/17/14 12:55 PM  Result Value Ref Range   Glucose-Capillary 196 (H) 65 - 99 mg/dL  Heparin level (unfractionated)     Status: Abnormal   Collection Time: 12/17/14  4:32 PM  Result Value Ref Range   Heparin Unfractionated 0.14 (L) 0.30 - 0.70 IU/mL    Comment:        IF HEPARIN RESULTS ARE BELOW EXPECTED VALUES, AND PATIENT DOSAGE HAS BEEN CONFIRMED, SUGGEST FOLLOW UP TESTING OF ANTITHROMBIN III LEVELS.   Comprehensive metabolic panel     Status: Abnormal   Collection Time: 12/17/14  4:32 PM  Result Value Ref Range   Sodium 138 135 - 145 mmol/L   Potassium 4.0 3.5 - 5.1 mmol/L   Chloride 110 101 - 111  mmol/L   CO2 21 (L) 22 - 32 mmol/L   Glucose, Bld 167 (H) 65 - 99 mg/dL   BUN 8 6 - 20 mg/dL   Creatinine, Ser 0.75 0.44 - 1.00 mg/dL   Calcium 8.4 (L) 8.9 - 10.3 mg/dL   Total Protein 5.8 (L) 6.5 - 8.1 g/dL   Albumin 2.1 (L) 3.5 - 5.0 g/dL   AST 24 15 - 41 U/L   ALT 66 (H) 14 - 54 U/L   Alkaline Phosphatase 99 38 - 126 U/L   Total Bilirubin 0.3 0.3 - 1.2 mg/dL   GFR calc non Af Amer >60 >60 mL/min   GFR calc Af Amer >60 >60 mL/min    Comment: (NOTE) The eGFR has been calculated using the CKD EPI equation. This calculation has not been validated in all clinical situations. eGFR's persistently <60 mL/min signify possible Chronic Kidney Disease.    Anion gap 7 5 - 15  Magnesium     Status: None   Collection Time: 12/17/14  4:32 PM  Result Value Ref Range   Magnesium 1.8 1.7 - 2.4 mg/dL   Recent Results (from the past 240 hour(s))  Urine culture     Status: None   Collection Time: 12/13/14  4:45 PM  Result Value Ref Range Status   Specimen Description URINE, CATHETERIZED  Final   Special Requests NONE  Final   Culture >=100,000 COLONIES/mL PROTEUS MIRABILIS  Final   Report Status 12/15/2014 FINAL  Final   Organism ID, Bacteria PROTEUS MIRABILIS  Final      Susceptibility  Proteus mirabilis - MIC*    AMPICILLIN >=32 RESISTANT Resistant     CEFAZOLIN <=4 SENSITIVE Sensitive     CEFTRIAXONE <=1 SENSITIVE Sensitive     CIPROFLOXACIN >=4 RESISTANT Resistant     GENTAMICIN <=1 SENSITIVE Sensitive     IMIPENEM 1 SENSITIVE Sensitive     NITROFURANTOIN 128 RESISTANT Resistant     TRIMETH/SULFA >=320 RESISTANT Resistant     AMPICILLIN/SULBACTAM >=32 RESISTANT Resistant     PIP/TAZO <=4 SENSITIVE Sensitive     * >=100,000 COLONIES/mL PROTEUS MIRABILIS  Gram stain     Status: None   Collection Time: 12/13/14  4:45 PM  Result Value Ref Range Status   Specimen Description URINE, CATHETERIZED  Final   Special Requests NONE  Final   Gram Stain   Final    WBC SEEN  POLYMORPHONUCLEAR GRAM POSITIVE COCCI GRAM NEGATIVE RODS CYTOSPUN SLIDE CALLED RN B JACOBELLI AT [phone] [place]    Report Status 12/14/2014 FINAL  Final  MRSA PCR Screening     Status: None   Collection Time: 12/13/14  9:45 PM  Result Value Ref Range Status   MRSA by PCR NEGATIVE NEGATIVE Final    Comment:        The GeneXpert MRSA Assay (FDA approved for NASAL specimens only), is one component of a comprehensive MRSA colonization surveillance program. It is not intended to diagnose MRSA infection nor to guide or monitor treatment for MRSA infections.   Culture, blood (routine x 2)     Status: None (Preliminary result)   Collection Time: 12/14/14  5:35 AM  Result Value Ref Range Status   Specimen Description BLOOD CENTRAL LINE  Final   Special Requests BOTTLES DRAWN AEROBIC AND ANAEROBIC 10CC EACH  Final   Culture NO GROWTH 3 DAYS  Final   Report Status PENDING  Incomplete  Culture, blood (routine x 2)     Status: None (Preliminary result)   Collection Time: 12/14/14  5:35 AM  Result Value Ref Range Status   Specimen Description BLOOD CENTRAL LINE  Final   Special Requests BOTTLES DRAWN AEROBIC AND ANAEROBIC 10CC EACH  Final   Culture NO GROWTH 3 DAYS  Final   Report Status PENDING  Incomplete   Creatinine:  Recent Labs  12/13/14 1620 12/14/14 0535 12/15/14 0500 12/15/14 1817 12/16/14 0436 12/17/14 1632  CREATININE 4.74* 2.71* 1.14* 0.97 0.86 0.75    Xrays: See report/chart See above   Impression/Assessment:  The clinical picture looks like bilateral pyelonephritis (railogy suggested ascending UTI) She has minimal caliectasis associated with one of her stones of the left No pain and normal Cr   Plan:  Ok not to stent now but will need close f/up in clinic- I gave her my name/number Can go home with appropriate antibiotics without foley unless needed for other reasons Acute DVT now being treated Ongoing confusion Has significant stone burden and  likely will need perc procedure as outpt but not in acute setting Stone may likely have been cause of stones  PLEASE MAKE SURE ARRANGEMENTS ARE MADE TO SEE ME IN CLINICA AS OUTPT   Bader Stubblefield A 12/17/2014, 7:17 PM

## 2014-12-17 NOTE — Progress Notes (Signed)
ANTICOAGULATION CONSULT NOTE - Follow Up Consult  Pharmacy Consult:  Heparin Indication:  Thrombus within the left external iliac and common femoral veins  No Known Allergies  Patient Measurements: Height: 5\' 6"  (167.6 cm) Weight: 142 lb 9.6 oz (64.683 kg) IBW/kg (Calculated) : 59.3 Heparin Dosing Weight: 65 kg  Vital Signs: Temp: 97.4 F (36.3 C) (07/06 1000) Temp Source: Oral (07/06 1000) BP: 142/67 mmHg (07/06 1000) Pulse Rate: 87 (07/06 1000)  Labs:  Recent Labs  12/15/14 0500 12/15/14 1817 12/16/14 0300 12/16/14 0436 12/16/14 1109 12/17/14 1200  HGB 9.2*  --   --  7.9*  --  10.3*  HCT 27.0*  --   --  23.0*  --  30.1*  PLT 255  --   --  230  --  355  HEPARINUNFRC  --   --  0.35  --  0.30  --   CREATININE 1.14* 0.97  --  0.86  --   --     Estimated Creatinine Clearance: 68.4 mL/min (by C-G formula based on Cr of 0.86).     Assessment: 41 YOF with new thrombus within the left external iliac and common femoral veins to continue on IV heparin.  Heparin level was low therapeutic yesterday and rate was increased slightly.  MD convinced patient to allow lab draw; however, sample was hemolyzed.  Lab will attempt to obtain labs again.    Goal of Therapy:  Heparin level 0.3-0.7 units/ml Monitor platelets by anticoagulation protocol: Yes    Plan:  - Continue heparin gtt at 1050 units/hr - F/U with today's labs if possible.  TRH awaiting Urology's input before changing patient over to PO med.  Consider switching over to Lovenox if PO med is not yet an option. - Daily HL / CBC    Ryu Cerreta D. Mina Marble, PharmD, BCPS Pager:  [phone] 12/17/2014, 2:39 PM

## 2014-12-17 NOTE — Progress Notes (Signed)
PT Cancellation Note  Patient Details Name: [name] MRN: [id number] DOB: 10/11/1960   Cancelled Treatment:    Reason Eval/Treat Not Completed: Patient declined, no reason specified Pt declines to work with therapy at this time. States she is too fatigued to participate, even with lower level activities when offered. Encouraged to remain mobile while in hospital to prevent secondary complications. Will follow up as time allows.  Ellouise Newer 12/17/2014, 4:14 PM Camille Bal [place], [place]

## 2014-12-17 NOTE — Clinical Social Work Placement (Signed)
   CLINICAL SOCIAL WORK PLACEMENT  NOTE  Date:  12/17/2014  Patient Details  Name: [name] MRN: [id number] Date of Birth: 04-07-1961  Clinical Social Work is seeking post-discharge placement for this patient at the [place] level of care (*CSW will initial, date and re-position this form in  chart as items are completed):  Yes   Patient/family provided with [place] Work Department's list of facilities offering this level of care within the geographic area requested by the patient (or if unable, by the patient's family).  Yes   Patient/family informed of their freedom to choose among providers that offer the needed level of care, that participate in Medicare, Medicaid or managed care program needed by the patient, have an available bed and are willing to accept the patient.  Yes   Patient/family informed of [facility]'s ownership interest in [facility] and [facility], as well as of the fact that they are under no obligation to receive care at these facilities.  PASRR submitted to EDS on  (n/a)     PASRR number received on  (n/a)     Existing PASRR number confirmed on 12/17/14     FL2 transmitted to all facilities in geographic area requested by pt/family on 12/17/14     FL2 transmitted to all facilities within larger geographic area on  (n/a)     Patient informed that his/her managed care company has contracts with or will negotiate with certain facilities, including the following:   [facility], yes.)         Patient/family informed of bed offers received.  Patient chooses bed at       Physician recommends and patient chooses bed at      Patient to be transferred to   on  .  Patient to be transferred to facility by       Patient family notified on   of transfer.  Name of family member notified:        PHYSICIAN Please sign FL2     Additional Comment:    _______________________________________________ Caroline Sauger, LCSW 12/17/2014, 12:51 PM [phone]

## 2014-12-18 LAB — BASIC METABOLIC PANEL WITH GFR
Anion gap: 5 (ref 5–15)
BUN: 8 mg/dL (ref 6–20)
CO2: 24 mmol/L (ref 22–32)
Calcium: 8.5 mg/dL — ABNORMAL LOW (ref 8.9–10.3)
Chloride: 109 mmol/L (ref 101–111)
Creatinine, Ser: 0.61 mg/dL (ref 0.44–1.00)
GFR calc Af Amer: >60 mL/min
GFR calc non Af Amer: >60 mL/min
Glucose, Bld: 106 mg/dL — ABNORMAL HIGH (ref 65–99)
Potassium: 3.3 mmol/L — ABNORMAL LOW (ref 3.5–5.1)
Sodium: 138 mmol/L (ref 135–145)

## 2014-12-18 LAB — HEPARIN LEVEL (UNFRACTIONATED)
Heparin Unfractionated: 0.7 [IU]/mL (ref 0.30–0.70)
Heparin Unfractionated: 0.72 IU/mL — ABNORMAL HIGH (ref 0.30–0.70)

## 2014-12-18 LAB — CBC
HCT: 26.1 % — ABNORMAL LOW (ref 36.0–46.0)
Hemoglobin: 8.8 g/dL — ABNORMAL LOW (ref 12.0–15.0)
MCH: 28.8 pg (ref 26.0–34.0)
MCHC: 33.7 g/dL (ref 30.0–36.0)
MCV: 85.3 fL (ref 78.0–100.0)
PLATELETS: 304 10*3/uL (ref 150–400)
RBC: 3.06 MIL/uL — AB (ref 3.87–5.11)
RDW: 14.6 % (ref 11.5–15.5)
WBC: 15 10*3/uL — AB (ref 4.0–10.5)

## 2014-12-18 LAB — GLUCOSE, CAPILLARY
GLUCOSE-CAPILLARY: 182 mg/dL — AB (ref 65–99)
Glucose-Capillary: 203 mg/dL — ABNORMAL HIGH (ref 65–99)
Glucose-Capillary: 129 mg/dL — ABNORMAL HIGH (ref 65–99)
Glucose-Capillary: 137 mg/dL — ABNORMAL HIGH (ref 65–99)

## 2014-12-18 LAB — MAGNESIUM: Magnesium: 1.6 mg/dL — ABNORMAL LOW (ref 1.7–2.4)

## 2014-12-18 MED ORDER — HYDROCORTISONE NA SUCCINATE PF 100 MG IJ SOLR
50.0000 mg | Freq: Every day | INTRAMUSCULAR | Status: AC
  Administered 2014-12-19: 50 mg via INTRAVENOUS
  Filled 2014-12-18: qty 2

## 2014-12-18 MED ORDER — RIVAROXABAN 20 MG PO TABS: 20.0000 mg | ORAL_TABLET | Freq: Every day | ORAL | Status: DC

## 2014-12-18 MED ORDER — RIVAROXABAN 15 MG PO TABS
15.0000 mg | ORAL_TABLET | Freq: Two times a day (BID) | ORAL | Status: DC
  Administered 2014-12-18 – 2014-12-21 (×7): 15 mg via ORAL
  Filled 2014-12-18 (×7): qty 1

## 2014-12-18 MED ORDER — MAGNESIUM SULFATE 2 GM/50ML IV SOLN
2.0000 g | Freq: Once | INTRAVENOUS | Status: AC
  Administered 2014-12-18: 2 g via INTRAVENOUS
  Filled 2014-12-18: qty 50

## 2014-12-18 MED ORDER — POTASSIUM CHLORIDE CRYS ER 20 MEQ PO TBCR
40.0000 meq | EXTENDED_RELEASE_TABLET | Freq: Once | ORAL | Status: AC
  Administered 2014-12-18: 40 meq via ORAL
  Filled 2014-12-18: qty 2

## 2014-12-18 NOTE — Care Management (Signed)
[facility] consult :   Unfortunately, currently the patient is not on the Medicare list as being eligible for [facility] Care Management services at this time.         Ronnell Freshwater. [place], [place] Management    [place] Assistant    Phone: [phone]    Fax: [phone]

## 2014-12-18 NOTE — Care Management (Signed)
Important Message  Patient Details  Name: [name] MRN: [id number] Date of Birth: [birth year]   Medicare Important Message Given:  Yes-third notification given    Delorse Lek 12/18/2014, 2:43 PM

## 2014-12-18 NOTE — Progress Notes (Signed)
PROGRESS NOTE  [name] BJY:[id number] DOB: 1960-07-18 DOA: 12/13/2014 PCP: [name], MD  HPI/Recap of past 24 hours:  Reported feeling better, denies pain. Want to keep foley inplace for now. lessConfused, oriented x3 but still not oriented to situation and poor memory.  Some visual hallucination today. No agitation, very pleasant.  Assessment/Plan: Principal Problem:   At high risk for severe sepsis Active Problems:   Multiple sclerosis diagnosed 2002-not on Therapy any longer   Diabetes mellitus   High blood pressure   AKI (acute kidney injury)   Metabolic acidosis   Sepsis   Septic shock   Pressure ulcer   Acute renal failure  Septic shock secondary to uti/pyelonephritis:  required pressor support and icu care from 7/3 tp 7/5. Improving, transferred to hospitalist service on 7/6.  Continue abx, taper stress dose steroids. Blood culture negative  Proteus uti/phelonephritis/ARF/left upj stone/chronic neurogenic bladder:  cr back to normal, on rocephin, foley in place,  urology consulted on 7/6, Dr. Matilde Sprang recommended outpatient urology follow up.   Incidental finding of DVT: Was treated with heparin drip initially transitioned to xarelto  From 7/7. Appreciate pharmacy input.   Metabolic encephalopathy: Continue to improve,  oriented x3,  Less confused, not oriented to situation, + poor memory,  Some visual hallucination on 7/7  Electrolytes abnormalities: hypokalemia, hypomagnesemia: replace k/mag.  Elevated transaminases:with normal alk phos,  likely from sepsis/septic shock, does has cholelithiasis on CT ab, but no symptom. Acute hepatitis panel pending Hold statin for now. Likely able to restart at discharge. improving  noninsulin dependent diabetes: metformin/glipizide held, on ssi here.  HTN:  bp meds held initially due to sepsis,   restarted lisinopril from 7/7.  Continue taper steroids.  H/o MS, wheelchair bound for 5years, bowel and  bladder incontinence, will need cir vs snf  Code Status: full  Family Communication: patient's cousin Vista Mink in room. Family agree with placement.  Vista Mink : [phone] (cell) Isaiah Blakes505 [phone] (cell)  Disposition Plan: CIR vs SNF   INDWELLING DEVICES:: R femoral CVL 7/03 >> 7/05  MICRO DATA: MRSA PCR 7/02 >> NEG Urine 7/02 >> proteus  Blood 7/03 >>    ANTIMICROBIALS:  Vanc 7/03 >> 7/04 Pip-tazo 7/03 >> 7/05 Ceftriaxone 7/02 >> 7/03, 7/05 >>   Consultants:  pccm  urology    Objective: BP 131/63 mmHg  Pulse 74  Temp(Src) 98.3 F (36.8 C) (Oral)  Resp 16  Ht _0  (1.676 m)  Wt 64.683 kg (142 lb 9.6 oz)  BMI 23.03 kg/m2  SpO2 100%  Intake/Output Summary (Last 24 hours) at 12/18/14 1304 Last data filed at 12/18/14 0600  Gross per 24 hour  Intake  984.5 ml  Output   1601 ml  Net -616.5 ml   Filed Weights   12/15/14 0606 12/16/14 0500 12/16/14 1454  Weight: 62.6 kg (138 lb 0.1 oz) 63.6 kg (140 lb 3.4 oz) 64.683 kg (142 lb 9.6 oz)    Exam:   General:  NAD, sitting in chair  Cardiovascular: RRR  Respiratory: CTABL  Abdomen: Soft/ND/NT, positive BS  Musculoskeletal: No Edema,. Chronic bilateral foot drop  Neuro: aaox3, some confusion, chronic changes related to MS.  Data Reviewed: Basic Metabolic Panel:  Recent Labs Lab 12/13/14 1620 12/14/14 0535 12/15/14 0500 12/15/14 1817 12/16/14 0436 12/17/14 1632 12/18/14 0523  NA 141 144 137 132* 136 138 138  K 5.7* 4.1 3.7 3.9 3.4* 4.0 3.3*  CL 108 119* 113* 106 112* 110  109  CO2 18* 17* 20* 18* 17* 21* 24  GLUCOSE 74 155* 122* 173* 126* 167* 106*  BUN 118* 79* 29* _0 CREATININE 4.74* 2.71* 1.14* 0.97 0.86 0.75 0.61  CALCIUM 10.6* 8.9 8.8* 7.7* 7.0* 8.4* 8.5*  MG 2.2 1.7  --   --  1.0* 1.8 1.6*  PHOS 5.6* 3.7  --   --  2.1*  --   --    Liver Function Tests:  Recent Labs Lab 12/13/14 1620 12/14/14 0535 12/17/14 1632  AST 13* 243* 24  ALT 12* 129*  66*  ALKPHOS 73 78 99  BILITOT 0.6 0.2* 0.3  PROT 9.0* 5.7* 5.8*  ALBUMIN 3.1* 2.1* 2.1*   No results for input(s): LIPASE, AMYLASE in the last 168 hours. No results for input(s): AMMONIA in the last 168 hours. CBC:  Recent Labs Lab 12/13/14 1620 12/14/14 0535 12/15/14 0500 12/16/14 0436 12/17/14 1200 12/18/14 0523  WBC 18.0* 12.5* 12.3* 7.6 10.2 15.0*  NEUTROABS 14.4* 9.3*  --  6.0  --   --   HGB 12.2 9.8* 9.2* 7.9* 10.3* 8.8*  HCT 35.5* 28.9* 27.0* 23.0* 30.1* 26.1*  MCV 86.6 86.3 85.4 86.1 86.0 85.3  PLT 321 289 255 230 355 304   Cardiac Enzymes:    Recent Labs Lab 12/13/14 2156  TROPONINI <0.03   BNP (last 3 results)  Recent Labs  12/13/14 1620  BNP 56.9    ProBNP (last 3 results) No results for input(s): PROBNP in the last 8760 hours.  CBG:  Recent Labs Lab 12/16/14 2153 12/17/14 1255 12/17/14 2158 12/18/14 0735 12/18/14 1159  GLUCAP 124* 196* 172* 182* 203*    Recent Results (from the past 240 hour(s))  Urine culture     Status: None   Collection Time: 12/13/14  4:45 PM  Result Value Ref Range Status   Specimen Description URINE, CATHETERIZED  Final   Special Requests NONE  Final   Culture >=100,000 COLONIES/mL PROTEUS MIRABILIS  Final   Report Status 12/15/2014 FINAL  Final   Organism ID, Bacteria PROTEUS MIRABILIS  Final      Susceptibility   Proteus mirabilis - MIC*    AMPICILLIN >=32 RESISTANT Resistant     CEFAZOLIN <=4 SENSITIVE Sensitive     CEFTRIAXONE <=1 SENSITIVE Sensitive     CIPROFLOXACIN >=4 RESISTANT Resistant     GENTAMICIN <=1 SENSITIVE Sensitive     IMIPENEM 1 SENSITIVE Sensitive     NITROFURANTOIN 128 RESISTANT Resistant     TRIMETH/SULFA >=320 RESISTANT Resistant     AMPICILLIN/SULBACTAM >=32 RESISTANT Resistant     PIP/TAZO <=4 SENSITIVE Sensitive     * >=100,000 COLONIES/mL PROTEUS MIRABILIS  Gram stain     Status: None   Collection Time: 12/13/14  4:45 PM  Result Value Ref Range Status   Specimen Description  URINE, CATHETERIZED  Final   Special Requests NONE  Final   Gram Stain   Final    WBC SEEN POLYMORPHONUCLEAR GRAM POSITIVE COCCI GRAM NEGATIVE RODS CYTOSPUN SLIDE CALLED RN B JACOBELLI AT [phone] [place]    Report Status 12/14/2014 FINAL  Final  MRSA PCR Screening     Status: None   Collection Time: 12/13/14  9:45 PM  Result Value Ref Range Status   MRSA by PCR NEGATIVE NEGATIVE Final    Comment:        The GeneXpert MRSA Assay (FDA approved for NASAL specimens only), is one component of a comprehensive MRSA colonization surveillance program.  It is not intended to diagnose MRSA infection nor to guide or monitor treatment for MRSA infections.   Culture, blood (routine x 2)     Status: None (Preliminary result)   Collection Time: 12/14/14  5:35 AM  Result Value Ref Range Status   Specimen Description BLOOD CENTRAL LINE  Final   Special Requests BOTTLES DRAWN AEROBIC AND ANAEROBIC 10CC EACH  Final   Culture NO GROWTH 3 DAYS  Final   Report Status PENDING  Incomplete  Culture, blood (routine x 2)     Status: None (Preliminary result)   Collection Time: 12/14/14  5:35 AM  Result Value Ref Range Status   Specimen Description BLOOD CENTRAL LINE  Final   Special Requests BOTTLES DRAWN AEROBIC AND ANAEROBIC 10CC EACH  Final   Culture NO GROWTH 3 DAYS  Final   Report Status PENDING  Incomplete     Studies: No results found.  Scheduled Meds: . aspirin EC  81 mg Oral Daily  . cefTRIAXone (ROCEPHIN)  IV  1 g Intravenous Q24H  . feeding supplement (ENSURE ENLIVE)  237 mL Oral BID BM  . [START ON 12/19/2014] hydrocortisone sodium succinate  50 mg Intravenous Daily  . insulin aspart  0-15 Units Subcutaneous TID WC  . insulin aspart  0-5 Units Subcutaneous QHS  . lisinopril  2.5 mg Oral Daily  . magnesium sulfate 1 - 4 g bolus IVPB  2 g Intravenous Once  . potassium chloride  40 mEq Oral Once  . rivaroxaban  15 mg Oral BID WC   Followed by  . [START ON 01/08/2015] rivaroxaban   20 mg Oral Q supper  . sodium chloride  3 mL Intravenous Q12H    Continuous Infusions:     Time spent: 38mns  Zacariah Belue MD, PhD  [facility] Pager [phone] If 7PM-7AM, please contact night-coverage at [email or web address], password [email or web address]7/12/2014, 1:04 PM  LOS: 5 days

## 2014-12-18 NOTE — Discharge Planning (Signed)
CSW presented SNF bed offers to patient's cousin, [name], via phone.  Per patient's cousin, family has chosen [facility] for patient at time of discharge due to the proximity of the facility to patient's family.  CSW updated [facility] regarding bed acceptance.  CSW to continue to follow and assist with discharge planning needs.  Lubertha Sayres, [place] Clinical Social Work Department Orthopedics [phone]) and Surgical [phone])

## 2014-12-18 NOTE — Discharge Instructions (Signed)
Information on my medicine - XARELTO (rivaroxaban)  This medication education was reviewed with me or my healthcare representative as part of my discharge preparation.  The pharmacist that spoke with me during my hospital stay was:  Lawson Radar, [place]? Xarelto was prescribed to treat blood clots that may have been found in the veins of your legs (deep vein thrombosis) or in your lungs (pulmonary embolism) and to reduce the risk of them occurring again.  What do you need to know about Xarelto? The starting dose is one 15 mg tablet taken TWICE daily with food for the FIRST 21 DAYS then on (enter date)  July 28th, 2016  the dose is changed to one 20 mg tablet taken ONCE A DAY with your evening meal.  DO NOT stop taking Xarelto without talking to the health care provider who prescribed the medication.  Refill your prescription for 20 mg tablets before you run out.  After discharge, you should have regular check-up appointments with your healthcare provider that is prescribing your Xarelto.  In the future your dose may need to be changed if your kidney function changes by a significant amount.  What do you do if you miss a dose? If you are taking Xarelto TWICE DAILY and you miss a dose, take it as soon as you remember. You may take two 15 mg tablets (total 30 mg) at the same time then resume your regularly scheduled 15 mg twice daily the next day.  If you are taking Xarelto ONCE DAILY and you miss a dose, take it as soon as you remember on the same day then continue your regularly scheduled once daily regimen the next day. Do not take two doses of Xarelto at the same time.   Important Safety Information Xarelto is a blood thinner medicine that can cause bleeding. You should call your healthcare provider right away if you experience any of the following: ? Bleeding from an injury or your nose that does not stop. ? Unusual colored urine (red or  dark brown) or unusual colored stools (red or black). ? Unusual bruising for unknown reasons. ? A serious fall or if you hit your head (even if there is no bleeding).  Some medicines may interact with Xarelto and might increase your risk of bleeding while on Xarelto. To help avoid this, consult your healthcare provider or pharmacist prior to using any new prescription or non-prescription medications, including herbals, vitamins, non-steroidal anti-inflammatory drugs (NSAIDs) and supplements.  This website has more information on Xarelto: [email or web address]

## 2014-12-18 NOTE — Progress Notes (Signed)
ANTICOAGULATION CONSULT NOTE - Follow Up Consult  Pharmacy Consult for Heparin >> Xarelto Indication: left Iliac and femoral vein thrombus  No Known Allergies  Patient Measurements: Height: 5\' 6"  (167.6 cm) Weight: 142 lb 9.6 oz (64.683 kg) IBW/kg (Calculated) : 59.3 Heparin Dosing Weight: 64 kg  Vital Signs: Temp: 98.3 F (36.8 C) (07/07 0547) Temp Source: Oral (07/07 0547) BP: 131/63 mmHg (07/07 0547) Pulse Rate: 74 (07/07 0547)  Labs:  Recent Labs  12/16/14 0436 12/16/14 1109 12/17/14 1200 12/17/14 1632 12/18/14 0146 12/18/14 0523  HGB 7.9*  --  10.3*  --   --  8.8*  HCT 23.0*  --  30.1*  --   --  26.1*  PLT 230  --  355  --   --  304  HEPARINUNFRC  --  0.30  --  0.14* 0.70  --   CREATININE 0.86  --   --  0.75  --  0.61    Estimated Creatinine Clearance: 73.5 mL/min (by C-G formula based on Cr of 0.61).   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 Urology was consulted for stones and are planning to not stent at this time. MD has consulted pharmacy to transition from Heparin to Xarelto this morning.   Will plan to educate prior to discharge.   Goal of Therapy:  Appropriate anticoagulation for indication and hepatic/renal function   Plan:  1. Stop heparin at 0930 this morning 2. At the time of heparin d/c, start Xarelto 15 mg bid x 21 days 3. Starting on July 28th, start Xarelto 20 mg once daily with supper 4. Will continue to monitor renal function, signs/symptoms of bleeding  Alycia Rossetti, PharmD, BCPS Clinical Pharmacist Pager: [phone] 12/18/2014 8:41 AM

## 2014-12-18 NOTE — Progress Notes (Signed)
ANTICOAGULATION CONSULT NOTE - Follow Up Consult  Pharmacy Consult for heparin Indication: left Iliac and femoral vein thrombus   Labs:  Recent Labs  12/15/14 0500 12/15/14 1817  12/16/14 0436 12/16/14 1109 12/17/14 1200 12/17/14 1632 12/18/14 0146  HGB 9.2*  --   --  7.9*  --  10.3*  --   --   HCT 27.0*  --   --  23.0*  --  30.1*  --   --   PLT 255  --   --  230  --  355  --   --   HEPARINUNFRC  --   --   < >  --  0.30  --  0.14* 0.70  CREATININE 1.14* 0.97  --  0.86  --   --  0.75  --   < > = values in this interval not displayed.    Assessment/Plan:  54yo female therapeutic on heparin after rate increase; at high end of goal but would prefer higher than low. Will continue gtt at current rate and confirm stable with additional level.   [name], PharmD, BCPS  12/18/2014,2:49 AM

## 2014-12-18 NOTE — Progress Notes (Signed)
Physical Therapy Treatment Patient Details Name: [name] MRN: [id number] DOB: 02-19-1961 Today's Date: 12/18/2014    History of Present Illness Patient is a 54 y/o female admitted due to [place] and found to have pyelonephritis.  PMH positive for MS and at baseline is wheelchair bound.    PT Comments    Patient very pleasant throughout session today. Patient tolerated EOB and OOB to chair with physical assist. Patient remains limited in overall mobility compared to baseline. Will continue to see and progress as tolerated. Current POC remains appropriate.   Follow Up Recommendations  CIR     Equipment Recommendations  Other (comment) (patient reports needs new power chair; has initiated with DME company)    Recommendations for Other Services Rehab consult;OT consult     Precautions / Restrictions Precautions Precautions: Fall Restrictions Weight Bearing Restrictions: No    Mobility  Bed Mobility Overal bed mobility: Needs Assistance Bed Mobility: Rolling;Sidelying to Sit Rolling: Mod assist Sidelying to sit: Max assist       General bed mobility comments: use of chuck pad to initiate trunk and LE movements to EOB, patient able to use UEs but required increased cues both verbal and tactile  Transfers Overall transfer level: Needs assistance Equipment used:  (chuck pad sling) Transfers: Squat Pivot Transfers     Squat pivot transfers: Max assist;+2 physical assistance     General transfer comment: scooted up to head of bed via squat pivot along edge of bed with max assist  Ambulation/Gait             General Gait Details: patient non-ambulatory   Stairs            Wheelchair Mobility    Modified Rankin (Stroke Patients Only)       Balance Overall balance assessment: Needs assistance Sitting-balance support: Feet supported Sitting balance-Leahy Scale: Poor Sitting balance - Comments: min to mod support for sitting balance at edge of bed  about 10 minutes     Standing balance-Leahy Scale: Zero                      Cognition Arousal/Alertness: Awake/alert Behavior During Therapy: WFL for tasks assessed/performed Overall Cognitive Status: Within Functional Limits for tasks assessed                      Exercises      General Comments General comments (skin integrity, edema, etc.): tolerated some EOB trunk work with UEs supported. Assisted patient with find motor coordination tasks while positioned OOB to chair.       Pertinent Vitals/Pain Pain Assessment: No/denies pain    Home Living                      Prior Function            PT Goals (current goals can now be found in the care plan section) Acute Rehab PT Goals Patient Stated Goal: To live independent PT Goal Formulation: With patient Time For Goal Achievement: 12/30/14 Potential to Achieve Goals: Fair Progress towards PT goals: Progressing toward goals    Frequency  Min 3X/week    PT Plan Current plan remains appropriate    Co-evaluation             End of Session Equipment Utilized During Treatment: Gait belt Activity Tolerance: Patient tolerated treatment well Patient left: in chair;with call bell/phone within reach     Time: 0919-0940 PT  Time Calculation (min) (ACUTE ONLY): 21 min  Charges:  $Therapeutic Activity: 8-22 mins                    G CodesDuncan Dull Dec 29, 2014, 1:36 PM Alben Deeds, [place] DPT  [phone]

## 2014-12-19 DIAGNOSIS — R32 Unspecified urinary incontinence: Secondary | ICD-10-CM

## 2014-12-19 DIAGNOSIS — N319 Neuromuscular dysfunction of bladder, unspecified: Secondary | ICD-10-CM

## 2014-12-19 DIAGNOSIS — R627 Adult failure to thrive: Secondary | ICD-10-CM

## 2014-12-19 DIAGNOSIS — N10 Acute tubulo-interstitial nephritis: Secondary | ICD-10-CM

## 2014-12-19 LAB — GLUCOSE, CAPILLARY
GLUCOSE-CAPILLARY: 263 mg/dL — AB (ref 65–99)
GLUCOSE-CAPILLARY: 168 mg/dL — AB (ref 65–99)
GLUCOSE-CAPILLARY: 128 mg/dL — AB (ref 65–99)
Glucose-Capillary: 100 mg/dL — ABNORMAL HIGH (ref 65–99)

## 2014-12-19 LAB — BASIC METABOLIC PANEL
Anion gap: 4 — ABNORMAL LOW (ref 5–15)
BUN: 10 mg/dL (ref 6–20)
CHLORIDE: 111 mmol/L (ref 101–111)
CO2: 25 mmol/L (ref 22–32)
Calcium: 8.8 mg/dL — ABNORMAL LOW (ref 8.9–10.3)
Creatinine, Ser: 0.67 mg/dL (ref 0.44–1.00)
GFR calc Af Amer: >60 mL/min (ref >60)
GFR calc non Af Amer: >60 mL/min (ref >60)
GLUCOSE: 85 mg/dL (ref 65–99)
POTASSIUM: 3.6 mmol/L (ref 3.5–5.1)
SODIUM: 140 mmol/L (ref 135–145)

## 2014-12-19 LAB — CBC
HCT: 25.4 % — ABNORMAL LOW (ref 36.0–46.0)
Hemoglobin: 8.6 g/dL — ABNORMAL LOW (ref 12.0–15.0)
MCH: 29.7 pg (ref 26.0–34.0)
MCHC: 33.9 g/dL (ref 30.0–36.0)
MCV: 87.6 fL (ref 78.0–100.0)
PLATELETS: 311 10*3/uL (ref 150–400)
RBC: 2.9 MIL/uL — AB (ref 3.87–5.11)
RDW: 15 % (ref 11.5–15.5)
WBC: 15.5 10*3/uL — ABNORMAL HIGH (ref 4.0–10.5)

## 2014-12-19 LAB — HEPATITIS PANEL, ACUTE
HEP B C IGM: NEGATIVE
HEP B S AG: NEGATIVE
Hep A IgM: NEGATIVE

## 2014-12-19 LAB — CULTURE, BLOOD (ROUTINE X 2)
CULTURE: NO GROWTH
CULTURE: NO GROWTH

## 2014-12-19 LAB — MAGNESIUM: Magnesium: 1.8 mg/dL (ref 1.7–2.4)

## 2014-12-19 LAB — HEPARIN LEVEL (UNFRACTIONATED): Heparin Unfractionated: >2.2 IU/mL — ABNORMAL HIGH (ref 0.30–0.70)

## 2014-12-19 MED ORDER — FLORANEX PO PACK
1.0000 g | PACK | Freq: Three times a day (TID) | ORAL | Status: DC
  Administered 2014-12-19 – 2014-12-21 (×5): 1 g via ORAL
  Filled 2014-12-19 (×8): qty 1

## 2014-12-19 NOTE — Progress Notes (Signed)
PROGRESS NOTE  [name] OEV:[id number] DOB: 05-20-1961 DOA: 12/13/2014 PCP: [name], MD  HPI/Recap of past 24 hours:  Reported feeling better, denies pain. Want to keep foley inplace for now. oriented x3 but still not oriented to situation and poor memory. Tangential thought process    Assessment/Plan: Principal Problem:   At high risk for severe sepsis Active Problems:   Multiple sclerosis diagnosed 2002-not on Therapy any longer   Diabetes mellitus   High blood pressure   AKI (acute kidney injury)   Metabolic acidosis   Sepsis   Septic shock   Pressure ulcer   Acute renal failure  Septic shock secondary to uti/pyelonephritis:  required pressor support and icu care from 7/3 tp 7/5. Improving, transferred to hospitalist service on 7/6.  Continue abx, taper stress dose steroids. Blood culture negative  Proteus uti/phelonephritis/ARF/left upj stone/chronic neurogenic bladder:  cr back to normal, on rocephin, foley in place,  urology consulted on 7/6, Dr. Matilde Sprang recommended outpatient urology follow up.  PMD need to coordinate with Dr. Matilde Sprang  About xarelto use if procedure planned.   Incidental finding of DVT: Was treated with heparin drip initially transitioned to xarelto  From 7/7.  Appreciate pharmacy input.   Metabolic encephalopathy: Continue to improve,  oriented x3,  Less confused, not oriented to situation, + poor memory,  Some visual hallucination on 7/7 7/8 tangential thought process, but seems less confused, very pleasant.  Electrolytes abnormalities: hypokalemia, hypomagnesemia: replace k/mag.  Elevated transaminases:with normal alk phos,  likely from sepsis/septic shock, does has cholelithiasis on CT ab, but no symptom. Acute hepatitis panel negative Hold statin for now. Likely able to restart at discharge. improving  noninsulin dependent diabetes: metformin/glipizide held, on ssi here.  HTN:  bp meds held initially due to sepsis,    restarted lisinopril from 7/7.  Continue taper steroids.  H/o MS, wheelchair bound for 5years, bowel and bladder incontinence,  snf  Code Status: full  Family Communication: patient's cousin Vista Mink in room. Family agree with placement.  Vista Mink : [phone] (cell) Isaiah [phone] (cell)  Disposition Plan: SNF, likely 7/9   INDWELLING DEVICES:: R femoral CVL 7/03 >> 7/05  MICRO DATA: MRSA PCR 7/02 >> NEGative Urine 7/02 >> proteus  Blood 7/03 >>    ANTIMICROBIALS:  Vanc 7/03 >> 7/04 Pip-tazo 7/03 >> 7/05 Ceftriaxone 7/02 >> 7/03, 7/05 >>   Consultants:  pccm  urology    Objective: BP 130/66 mmHg  Pulse 78  Temp(Src) 98.6 F (37 C) (Oral)  Resp 16  Ht '5\' 6"'  (1.676 m)  Wt 64.683 kg (142 lb 9.6 oz)  BMI 23.03 kg/m2  SpO2 100%  Intake/Output Summary (Last 24 hours) at 12/19/14 1443 Last data filed at 12/19/14 0840  Gross per 24 hour  Intake    800 ml  Output   1800 ml  Net  -1000 ml   Filed Weights   12/15/14 0606 12/16/14 0500 12/16/14 1454  Weight: 62.6 kg (138 lb 0.1 oz) 63.6 kg (140 lb 3.4 oz) 64.683 kg (142 lb 9.6 oz)    Exam:   General:  NAD,   Cardiovascular: RRR  Respiratory: CTABL  Abdomen: Soft/ND/NT, positive BS  Musculoskeletal: No Edema,. Chronic bilateral foot drop  Neuro: chronic changes related to MS. (lower extremity weakness, urine and bowel incontinence), aaox3, some confusion.  Data Reviewed: Basic Metabolic Panel:  Recent Labs Lab 12/13/14 1620 12/14/14 0535  12/15/14 1817 12/16/14 1025 12/17/14 1632 12/18/14 0523 12/19/14  0420  NA 141 144  < > 132* 136 138 138 140  K 5.7* 4.1  < > 3.9 3.4* 4.0 3.3* 3.6  CL 108 119*  < > 106 112* 110 109 111  CO2 18* 17*  < > 18* 17* 21* 24 25  GLUCOSE 74 155*  < > 173* 126* 167* 106* 85  BUN 118* 79*  < > '19 13 8 8 10  ' CREATININE 4.74* 2.71*  < > 0.97 0.86 0.75 0.61 0.67  CALCIUM 10.6* 8.9  < > 7.7* 7.0* 8.4* 8.5* 8.8*  MG 2.2 1.7  --   --   1.0* 1.8 1.6* 1.8  PHOS 5.6* 3.7  --   --  2.1*  --   --   --   < > = values in this interval not displayed. Liver Function Tests:  Recent Labs Lab 12/13/14 1620 12/14/14 0535 12/17/14 1632  AST 13* 243* 24  ALT 12* 129* 66*  ALKPHOS 73 78 99  BILITOT 0.6 0.2* 0.3  PROT 9.0* 5.7* 5.8*  ALBUMIN 3.1* 2.1* 2.1*   No results for input(s): LIPASE, AMYLASE in the last 168 hours. No results for input(s): AMMONIA in the last 168 hours. CBC:  Recent Labs Lab 12/13/14 1620 12/14/14 0535 12/15/14 0500 12/16/14 0436 12/17/14 1200 12/18/14 0523 12/19/14 0420  WBC 18.0* 12.5* 12.3* 7.6 10.2 15.0* 15.5*  NEUTROABS 14.4* 9.3*  --  6.0  --   --   --   HGB 12.2 9.8* 9.2* 7.9* 10.3* 8.8* 8.6*  HCT 35.5* 28.9* 27.0* 23.0* 30.1* 26.1* 25.4*  MCV 86.6 86.3 85.4 86.1 86.0 85.3 87.6  PLT 321 289 255 230 355 304 311   Cardiac Enzymes:    Recent Labs Lab 12/13/14 2156  TROPONINI <0.03   BNP (last 3 results)  Recent Labs  12/13/14 1620  BNP 56.9    ProBNP (last 3 results) No results for input(s): PROBNP in the last 8760 hours.  CBG:  Recent Labs Lab 12/18/14 1159 12/18/14 1659 12/18/14 2207 12/19/14 0802 12/19/14 1148  GLUCAP 203* 129* 137* 100* 263*    Recent Results (from the past 240 hour(s))  Urine culture     Status: None   Collection Time: 12/13/14  4:45 PM  Result Value Ref Range Status   Specimen Description URINE, CATHETERIZED  Final   Special Requests NONE  Final   Culture >=100,000 COLONIES/mL PROTEUS MIRABILIS  Final   Report Status 12/15/2014 FINAL  Final   Organism ID, Bacteria PROTEUS MIRABILIS  Final      Susceptibility   Proteus mirabilis - MIC*    AMPICILLIN >=32 RESISTANT Resistant     CEFAZOLIN <=4 SENSITIVE Sensitive     CEFTRIAXONE <=1 SENSITIVE Sensitive     CIPROFLOXACIN >=4 RESISTANT Resistant     GENTAMICIN <=1 SENSITIVE Sensitive     IMIPENEM 1 SENSITIVE Sensitive     NITROFURANTOIN 128 RESISTANT Resistant     TRIMETH/SULFA >=320  RESISTANT Resistant     AMPICILLIN/SULBACTAM >=32 RESISTANT Resistant     PIP/TAZO <=4 SENSITIVE Sensitive     * >=100,000 COLONIES/mL PROTEUS MIRABILIS  Gram stain     Status: None   Collection Time: 12/13/14  4:45 PM  Result Value Ref Range Status   Specimen Description URINE, CATHETERIZED  Final   Special Requests NONE  Final   Gram Stain   Final    WBC SEEN POLYMORPHONUCLEAR GRAM POSITIVE COCCI GRAM NEGATIVE RODS CYTOSPUN SLIDE CALLED RN B JACOBELLI AT [place] [phone]  MARTINB    Report Status 12/14/2014 FINAL  Final  MRSA PCR Screening     Status: None   Collection Time: 12/13/14  9:45 PM  Result Value Ref Range Status   MRSA by PCR NEGATIVE NEGATIVE Final    Comment:        The GeneXpert MRSA Assay (FDA approved for NASAL specimens only), is one component of a comprehensive MRSA colonization surveillance program. It is not intended to diagnose MRSA infection nor to guide or monitor treatment for MRSA infections.   Culture, blood (routine x 2)     Status: None   Collection Time: 12/14/14  5:35 AM  Result Value Ref Range Status   Specimen Description BLOOD CENTRAL LINE  Final   Special Requests BOTTLES DRAWN AEROBIC AND ANAEROBIC 10CC EACH  Final   Culture NO GROWTH 5 DAYS  Final   Report Status 12/19/2014 FINAL  Final  Culture, blood (routine x 2)     Status: None   Collection Time: 12/14/14  5:35 AM  Result Value Ref Range Status   Specimen Description BLOOD CENTRAL LINE  Final   Special Requests BOTTLES DRAWN AEROBIC AND ANAEROBIC 10CC EACH  Final   Culture NO GROWTH 5 DAYS  Final   Report Status 12/19/2014 FINAL  Final     Studies: No results found.  Scheduled Meds: . aspirin EC  81 mg Oral Daily  . cefTRIAXone (ROCEPHIN)  IV  1 g Intravenous Q24H  . feeding supplement (ENSURE ENLIVE)  237 mL Oral BID BM  . insulin aspart  0-15 Units Subcutaneous TID WC  . insulin aspart  0-5 Units Subcutaneous QHS  . lisinopril  2.5 mg Oral Daily  . rivaroxaban  15 mg  Oral BID WC   Followed by  . [START ON 01/08/2015] rivaroxaban  20 mg Oral Q supper  . sodium chloride  3 mL Intravenous Q12H    Continuous Infusions:     Time spent: 38mns  Glorene Leitzke MD, PhD  [facility] Pager [phone] If 7PM-7AM, please contact night-coverage at [email or web address], password [email or web address]7/01/2015, 2:43 PM  LOS: 6 days

## 2014-12-19 NOTE — Care Management (Signed)
Important Message  Patient Details  Name: SORIYA WORSTER MRN: [id number] Date of Birth: 04-01-1961   Medicare Important Message Given:  Yes-third notification given    Marilu Favre, RN 12/19/2014, 11:55 AM

## 2014-12-19 NOTE — Progress Notes (Signed)
Physical Therapy Treatment Patient Details Name: [name] MRN: [id number] DOB: [birth year] Today's Date: 12/19/2014    History of Present Illness Patient is a 54 y/o female admitted due to [place] and found to have pyelonephritis.  PMH positive for MS and at baseline is wheelchair bound.    PT Comments    Motivated to work with physical therapy. Focused on bed mobility training, seated balance with postural control, and transfers. Tolerating activities well. Min-Max assist with very limited LE volitional movement. Complains of Lt calf pain with palpation, negative Homan's sign, RN notified. Patient will continue to benefit from skilled physical therapy services to further improve independence with functional mobility.   Follow Up Recommendations  CIR (If denied by CIR - Recommend SNF)     Equipment Recommendations  Other (comment) (patient reports needs new power chair; has initiated with DME company)    Recommendations for Other Services Rehab consult;OT consult     Precautions / Restrictions Precautions Precautions: Fall Restrictions Weight Bearing Restrictions: No    Mobility  Bed Mobility Overal bed mobility: Needs Assistance Bed Mobility: Rolling;Sidelying to Sit Rolling: Mod assist Sidelying to sit: Max assist;HOB elevated       General bed mobility comments: Min assist to roll towards right side, Mod assist to roll towards left. Physical assist provided to position opposite knee, VC for technique and for use of rail. Max assist for LE and trucal support to pull pt into seated position using RUE to hook with PTs arm for support and pushing through LUE from elevated bed.  Transfers Overall transfer level: Needs assistance Equipment used: None (chuck pad sling) Transfers: Squat Pivot Transfers     Squat pivot transfers: Max assist     General transfer comment: Cues for pt to lean forward towards side of pts hip and to push through LEs as able. Bed pad used to  support pts weight and BIL knees blocked with pillow between PT and patient's knees during squat pivot.  Ambulation/Gait             General Gait Details: patient non-ambulatory   Stairs            Wheelchair Mobility    Modified Rankin (Stroke Patients Only)       Balance   Sitting-balance support: Bilateral upper extremity supported;Feet supported Sitting balance-Leahy Scale: Poor Sitting balance - Comments: Moderate assist at times to regain balance however tolerated several minutes at-a-time with UEs used to brace self. Focused on Rt and Lt elbow proping  with return to mid-line. Leaning posteriorly with return to center. Sat for approx 10 minutes working on postural control.                            Cognition Arousal/Alertness: Awake/alert Behavior During Therapy: WFL for tasks assessed/performed Overall Cognitive Status: Within Functional Limits for tasks assessed                      Exercises General Exercises - Lower Extremity Ankle Circles/Pumps: PROM;Both;Supine (30 second passive gastroc stretch) Quad Sets: AROM;Strengthening;Both;10 reps;Supine Heel Slides: AAROM;Strengthening;Both;10 reps;Supine Hip ABduction/ADduction: AAROM;Strengthening;Both;10 reps;Supine Hip Flexion/Marching: Strengthening;Both;10 reps;AROM;Supine (pushing into PTs resistance with extension)    General Comments General comments (skin integrity, edema, etc.): Reports tenderness to palpation of left gastroc region. (-) Homan's      Pertinent Vitals/Pain Pain Assessment: No/denies pain    Home Living  Prior Function            PT Goals (current goals can now be found in the care plan section) Acute Rehab PT Goals Patient Stated Goal: To live independent PT Goal Formulation: With patient Time For Goal Achievement: 12/30/14 Potential to Achieve Goals: Fair Progress towards PT goals: Progressing toward goals     Frequency  Min 3X/week    PT Plan Current plan remains appropriate    Co-evaluation             End of Session Equipment Utilized During Treatment: Gait belt Activity Tolerance: Patient tolerated treatment well Patient left: in chair;with call bell/phone within reach     Time: 1551-1631 PT Time Calculation (min) (ACUTE ONLY): 40 min  Charges:  $Therapeutic Exercise: 8-22 mins $Therapeutic Activity: 23-37 mins                    G CodesEllouise Newer 2014/12/20, 4:54 PM Camille Bal [place], [place]

## 2014-12-20 DIAGNOSIS — D72829 Elevated white blood cell count, unspecified: Secondary | ICD-10-CM

## 2014-12-20 LAB — BASIC METABOLIC PANEL
Anion gap: 6 (ref 5–15)
BUN: 12 mg/dL (ref 6–20)
CALCIUM: 8.8 mg/dL — AB (ref 8.9–10.3)
CHLORIDE: 108 mmol/L (ref 101–111)
CO2: 25 mmol/L (ref 22–32)
CREATININE: 0.56 mg/dL (ref 0.44–1.00)
GFR calc Af Amer: >60 mL/min (ref >60)
GFR calc non Af Amer: >60 mL/min (ref >60)
Glucose, Bld: 81 mg/dL (ref 65–99)
Potassium: 3.6 mmol/L (ref 3.5–5.1)
SODIUM: 139 mmol/L (ref 135–145)

## 2014-12-20 LAB — CBC
HEMATOCRIT: 25.2 % — AB (ref 36.0–46.0)
Hemoglobin: 8.2 g/dL — ABNORMAL LOW (ref 12.0–15.0)
MCH: 28.2 pg (ref 26.0–34.0)
MCHC: 32.5 g/dL (ref 30.0–36.0)
MCV: 86.6 fL (ref 78.0–100.0)
PLATELETS: 349 10*3/uL (ref 150–400)
RBC: 2.91 MIL/uL — ABNORMAL LOW (ref 3.87–5.11)
RDW: 14.9 % (ref 11.5–15.5)
WBC: 17.1 10*3/uL — ABNORMAL HIGH (ref 4.0–10.5)

## 2014-12-20 LAB — GLUCOSE, CAPILLARY
GLUCOSE-CAPILLARY: 152 mg/dL — AB (ref 65–99)
Glucose-Capillary: 80 mg/dL (ref 65–99)
Glucose-Capillary: 173 mg/dL — ABNORMAL HIGH (ref 65–99)
Glucose-Capillary: 213 mg/dL — ABNORMAL HIGH (ref 65–99)

## 2014-12-20 LAB — URINALYSIS, ROUTINE W REFLEX MICROSCOPIC
Bilirubin Urine: NEGATIVE
GLUCOSE, UA: 100 mg/dL — AB
KETONES UR: NEGATIVE mg/dL
Nitrite: NEGATIVE
PROTEIN: NEGATIVE mg/dL
Specific Gravity, Urine: 1.01 (ref 1.005–1.030)
UROBILINOGEN UA: 0.2 mg/dL (ref 0.0–1.0)
pH: 7.5 (ref 5.0–8.0)

## 2014-12-20 LAB — URINE MICROSCOPIC-ADD ON

## 2014-12-20 LAB — MAGNESIUM: MAGNESIUM: 1.6 mg/dL — AB (ref 1.7–2.4)

## 2014-12-20 MED ORDER — MAGNESIUM SULFATE 2 GM/50ML IV SOLN
2.0000 g | Freq: Once | INTRAVENOUS | Status: AC
  Administered 2014-12-20: 2 g via INTRAVENOUS
  Filled 2014-12-20: qty 50

## 2014-12-20 NOTE — Progress Notes (Signed)
PROGRESS NOTE  [name] KCM:[id number] DOB: [birth year] DOA: 12/13/2014 PCP: [name], MD  HPI/Recap of past 24 hours:  Reported feeling better, denies pain. Want to keep foley inplace for now. oriented x3, though process has improved, still with poor short term memory. Worsening of leukocytosis for unclear reason.   Assessment/Plan: Principal Problem:   At high risk for severe sepsis Active Problems:   Multiple sclerosis diagnosed 2002-not on Therapy any longer   Diabetes mellitus   High blood pressure   AKI (acute kidney injury)   Metabolic acidosis   Sepsis   Septic shock   Pressure ulcer   Acute renal failure  Leukocytosis: worsening off steroids, no clear sign of infection, overall mentation improving, will repeat UA, diarrhea? Check c diff.  Septic shock secondary to uti/pyelonephritis:  required pressor support and icu care from 7/3 tp 7/5. Improving, transferred to hospitalist service on 7/6.  Finished abx treatment, tapered off stress dose steroids. Blood culture negative  Proteus uti/phelonephritis/ARF/left upj stone/chronic neurogenic bladder:  cr back to normal, on rocephin, foley in place,  urology consulted on 7/6, Dr. Matilde Sprang recommended outpatient urology follow up.  PMD need to coordinate with Dr. Matilde Sprang  About xarelto use if procedure planned.   Incidental finding of DVT: Was treated with heparin drip initially transitioned to xarelto  From 7/7.  Appreciate pharmacy input.   Metabolic encephalopathy: Continue to improve,  oriented x3,  Less confused, not oriented to situation, + poor memory,  Some visual hallucination on 7/7 7/8 tangential thought process, but seems less confused, very pleasant. 7/9 showed continued improvement in mental status, less confused, very pleasant, still poor short term memory.  Electrolytes abnormalities: hypokalemia, hypomagnesemia: replace k/mag. Persistent low mag, give iv mag on 7/9, consider  discharge on oral mag.  Elevated transaminases:with normal alk phos, likely from sepsis/septic shock, does has cholelithiasis on CT ab, but no symptom. Acute hepatitis panel negative Hold statin for now. Likely able to restart at discharge. improving  noninsulin dependent diabetes: metformin/glipizide held, on ssi here.  HTN:  bp meds held initially due to sepsis,   restarted lisinopril from 7/7.  Tapered off steroids.  H/o MS, wheelchair bound for 5years, neurogenic bladder requiring intermittent self catheterization, reported able to use bedside commode for bm. Need ttwo person assist  for transfer.  snf  Code Status: full  Family Communication: patient's cousin Vista Mink in room. Family agree with placement.  Vista Mink : [phone] (cell) Isaiah [phone] (cell)  Disposition Plan: SNF, likely 7/10   INDWELLING DEVICES:: R femoral CVL 7/03 >> 7/05  MICRO DATA: MRSA PCR 7/02 >> NEGative Urine 7/02 >> proteus  Blood 7/03 >>    ANTIMICROBIALS:  Vanc 7/03 >> 7/04 Pip-tazo 7/03 >> 7/05 Ceftriaxone 7/02 >> 7/03, 7/05 >> 7/8  Consultants:  pccm  urology    Objective: BP 104/45 mmHg  Pulse 78  Temp(Src) 98.9 F (37.2 C) (Oral)  Resp 16  Ht '5\' 6"'  (1.676 m)  Wt 64.683 kg (142 lb 9.6 oz)  BMI 23.03 kg/m2  SpO2 100%  Intake/Output Summary (Last 24 hours) at 12/20/14 1546 Last data filed at 12/20/14 0900  Gross per 24 hour  Intake    480 ml  Output   1200 ml  Net   -720 ml   Filed Weights   12/15/14 0606 12/16/14 0500 12/16/14 1454  Weight: 62.6 kg (138 lb 0.1 oz) 63.6 kg (140 lb 3.4 oz) 64.683 kg (142 lb 9.6  oz)    Exam:   General:  NAD, very pleasant and cooperative  Cardiovascular: RRR  Respiratory: CTABL  Abdomen: Soft/ND/NT, positive BS  Musculoskeletal: No Edema,. Chronic bilateral foot drop  Neuro: chronic changes related to MS. (lower extremity weakness, neurogenic bladder), aaox3, less confusion, poor short term  recall 2/3.  Data Reviewed: Basic Metabolic Panel:  Recent Labs Lab 12/13/14 1620 12/14/14 0535  12/16/14 0436 12/17/14 1632 12/18/14 0523 12/19/14 0420 12/20/14 0255  NA 141 144  < > 136 138 138 140 139  K 5.7* 4.1  < > 3.4* 4.0 3.3* 3.6 3.6  CL 108 119*  < > 112* 110 109 111 108  CO2 18* 17*  < > 17* 21* '24 25 25  ' GLUCOSE 74 155*  < > 126* 167* 106* 85 81  BUN 118* 79*  < > '13 8 8 10 12  ' CREATININE 4.74* 2.71*  < > 0.86 0.75 0.61 0.67 0.56  CALCIUM 10.6* 8.9  < > 7.0* 8.4* 8.5* 8.8* 8.8*  MG 2.2 1.7  --  1.0* 1.8 1.6* 1.8 1.6*  PHOS 5.6* 3.7  --  2.1*  --   --   --   --   < > = values in this interval not displayed. Liver Function Tests:  Recent Labs Lab 12/13/14 1620 12/14/14 0535 12/17/14 1632  AST 13* 243* 24  ALT 12* 129* 66*  ALKPHOS 73 78 99  BILITOT 0.6 0.2* 0.3  PROT 9.0* 5.7* 5.8*  ALBUMIN 3.1* 2.1* 2.1*   No results for input(s): LIPASE, AMYLASE in the last 168 hours. No results for input(s): AMMONIA in the last 168 hours. CBC:  Recent Labs Lab 12/13/14 1620 12/14/14 0535  12/16/14 0436 12/17/14 1200 12/18/14 0523 12/19/14 0420 12/20/14 0255  WBC 18.0* 12.5*  < > 7.6 10.2 15.0* 15.5* 17.1*  NEUTROABS 14.4* 9.3*  --  6.0  --   --   --   --   HGB 12.2 9.8*  < > 7.9* 10.3* 8.8* 8.6* 8.2*  HCT 35.5* 28.9*  < > 23.0* 30.1* 26.1* 25.4* 25.2*  MCV 86.6 86.3  < > 86.1 86.0 85.3 87.6 86.6  PLT 321 289  < > 230 355 304 311 349  < > = values in this interval not displayed. Cardiac Enzymes:    Recent Labs Lab 12/13/14 2156  TROPONINI <0.03   BNP (last 3 results)  Recent Labs  12/13/14 1620  BNP 56.9    ProBNP (last 3 results) No results for input(s): PROBNP in the last 8760 hours.  CBG:  Recent Labs Lab 12/19/14 1148 12/19/14 1657 12/19/14 2136 12/20/14 0736 12/20/14 1219  GLUCAP 263* 168* 128* 80 173*    Recent Results (from the past 240 hour(s))  Urine culture     Status: None   Collection Time: 12/13/14  4:45 PM  Result  Value Ref Range Status   Specimen Description URINE, CATHETERIZED  Final   Special Requests NONE  Final   Culture >=100,000 COLONIES/mL PROTEUS MIRABILIS  Final   Report Status 12/15/2014 FINAL  Final   Organism ID, Bacteria PROTEUS MIRABILIS  Final      Susceptibility   Proteus mirabilis - MIC*    AMPICILLIN >=32 RESISTANT Resistant     CEFAZOLIN <=4 SENSITIVE Sensitive     CEFTRIAXONE <=1 SENSITIVE Sensitive     CIPROFLOXACIN >=4 RESISTANT Resistant     GENTAMICIN <=1 SENSITIVE Sensitive     IMIPENEM 1 SENSITIVE Sensitive     NITROFURANTOIN  128 RESISTANT Resistant     TRIMETH/SULFA >=320 RESISTANT Resistant     AMPICILLIN/SULBACTAM >=32 RESISTANT Resistant     PIP/TAZO <=4 SENSITIVE Sensitive     * >=100,000 COLONIES/mL PROTEUS MIRABILIS  Gram stain     Status: None   Collection Time: 12/13/14  4:45 PM  Result Value Ref Range Status   Specimen Description URINE, CATHETERIZED  Final   Special Requests NONE  Final   Gram Stain   Final    WBC SEEN POLYMORPHONUCLEAR GRAM POSITIVE COCCI GRAM NEGATIVE RODS CYTOSPUN SLIDE CALLED RN B JACOBELLI AT [phone] [place]    Report Status 12/14/2014 FINAL  Final  MRSA PCR Screening     Status: None   Collection Time: 12/13/14  9:45 PM  Result Value Ref Range Status   MRSA by PCR NEGATIVE NEGATIVE Final    Comment:        The GeneXpert MRSA Assay (FDA approved for NASAL specimens only), is one component of a comprehensive MRSA colonization surveillance program. It is not intended to diagnose MRSA infection nor to guide or monitor treatment for MRSA infections.   Culture, blood (routine x 2)     Status: None   Collection Time: 12/14/14  5:35 AM  Result Value Ref Range Status   Specimen Description BLOOD CENTRAL LINE  Final   Special Requests BOTTLES DRAWN AEROBIC AND ANAEROBIC 10CC EACH  Final   Culture NO GROWTH 5 DAYS  Final   Report Status 12/19/2014 FINAL  Final  Culture, blood (routine x 2)     Status: None    Collection Time: 12/14/14  5:35 AM  Result Value Ref Range Status   Specimen Description BLOOD CENTRAL LINE  Final   Special Requests BOTTLES DRAWN AEROBIC AND ANAEROBIC 10CC EACH  Final   Culture NO GROWTH 5 DAYS  Final   Report Status 12/19/2014 FINAL  Final     Studies: No results found.  Scheduled Meds: . aspirin EC  81 mg Oral Daily  . feeding supplement (ENSURE ENLIVE)  237 mL Oral BID BM  . insulin aspart  0-15 Units Subcutaneous TID WC  . insulin aspart  0-5 Units Subcutaneous QHS  . lactobacillus  1 g Oral TID WC  . lisinopril  2.5 mg Oral Daily  . rivaroxaban  15 mg Oral BID WC   Followed by  . [START ON 01/08/2015] rivaroxaban  20 mg Oral Q supper  . sodium chloride  3 mL Intravenous Q12H    Continuous Infusions:     Time spent: 47mns  Matthan Sledge MD, PhD  [facility] Pager [phone] If 7PM-7AM, please contact night-coverage at [email or web address], password [facility]7/02/2015, 3:46 PM  LOS: 7 days

## 2014-12-20 NOTE — Progress Notes (Signed)
LCSW following for disposition. Bed available at [facility] today.  Awaiting DC orders and if medically stable.  Lane Hacker, MSW Clinical Social Work: Emergency Room [phone]

## 2014-12-21 LAB — BASIC METABOLIC PANEL
Anion gap: 5 (ref 5–15)
BUN: 7 mg/dL (ref 6–20)
CO2: 27 mmol/L (ref 22–32)
Calcium: 8.7 mg/dL — ABNORMAL LOW (ref 8.9–10.3)
Chloride: 107 mmol/L (ref 101–111)
Creatinine, Ser: 0.61 mg/dL (ref 0.44–1.00)
GFR calc Af Amer: >60 mL/min (ref >60)
Glucose, Bld: 87 mg/dL (ref 65–99)
POTASSIUM: 3.7 mmol/L (ref 3.5–5.1)
SODIUM: 139 mmol/L (ref 135–145)

## 2014-12-21 LAB — HEPATIC FUNCTION PANEL
ALT: 38 U/L (ref 14–54)
AST: 18 U/L (ref 15–41)
Albumin: 2 g/dL — ABNORMAL LOW (ref 3.5–5.0)
Alkaline Phosphatase: 67 U/L (ref 38–126)
Total Bilirubin: 0.3 mg/dL (ref 0.3–1.2)
Total Protein: 5.7 g/dL — ABNORMAL LOW (ref 6.5–8.1)

## 2014-12-21 LAB — CBC
HCT: 25 % — ABNORMAL LOW (ref 36.0–46.0)
Hemoglobin: 8.4 g/dL — ABNORMAL LOW (ref 12.0–15.0)
MCH: 29.8 pg (ref 26.0–34.0)
MCHC: 33.6 g/dL (ref 30.0–36.0)
MCV: 88.7 fL (ref 78.0–100.0)
Platelets: 388 10*3/uL (ref 150–400)
RBC: 2.82 MIL/uL — ABNORMAL LOW (ref 3.87–5.11)
RDW: 15 % (ref 11.5–15.5)
WBC: 16.3 10*3/uL — ABNORMAL HIGH (ref 4.0–10.5)

## 2014-12-21 LAB — MAGNESIUM: Magnesium: 1.8 mg/dL (ref 1.7–2.4)

## 2014-12-21 LAB — GLUCOSE, CAPILLARY
Glucose-Capillary: 78 mg/dL (ref 65–99)
Glucose-Capillary: 159 mg/dL — ABNORMAL HIGH (ref 65–99)

## 2014-12-21 MED ORDER — RIVAROXABAN (XARELTO) VTE STARTER PACK (15 & 20 MG): ORAL_TABLET | ORAL | Status: DC

## 2014-12-21 MED ORDER — FUROSEMIDE 40 MG PO TABS: 40.0000 mg | ORAL_TABLET | ORAL | Status: DC | PRN

## 2014-12-21 MED ORDER — FLORANEX PO PACK: 1.0000 g | PACK | Freq: Three times a day (TID) | ORAL | Status: DC

## 2014-12-21 MED ORDER — ENSURE ENLIVE PO LIQD: 237.0000 mL | Freq: Two times a day (BID) | ORAL | Status: DC

## 2014-12-21 MED ORDER — POTASSIUM CHLORIDE CRYS ER 20 MEQ PO TBCR: 20.0000 meq | EXTENDED_RELEASE_TABLET | Freq: Every day | ORAL | Status: DC

## 2014-12-21 NOTE — Progress Notes (Signed)
ANTICOAGULATION CONSULT NOTE - Follow Up Consult  Pharmacy Consult for Xarelto Indication: left Iliac and femoral vein thrombus  No Known Allergies  Patient Measurements: Height: 5\' 6"  (167.6 cm) Weight: 142 lb 9.6 oz (64.683 kg) IBW/kg (Calculated) : 59.3  Vital Signs: Temp: 98.6 F (37 C) (07/10 0555) Temp Source: Oral (07/10 0555) BP: 115/53 mmHg (07/10 0555) Pulse Rate: 75 (07/10 0555)  Labs:  Recent Labs  12/19/14 0420 12/20/14 0255 12/21/14 0425  HGB 8.6* 8.2* 8.4*  HCT 25.4* 25.2* 25.0*  PLT 311 349 388  HEPARINUNFRC >2.20*  --   --   CREATININE 0.67 0.56 0.61    Estimated Creatinine Clearance: 73.5 mL/min (by C-G formula based on Cr of 0.61).   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 The patient is tolerating Xarelto with no issues, renal function is stable, dose remains appropriate. Will sign off of consult and monitor peripherally for any complications.   Goal of Therapy:  Appropriate anticoagulation for hepatic/renal function   Plan:  1. Continue Xarelto 15 mg bid with meals for 21 days 2. Starting on July 28th, start Xarelto 20 mg once daily with supper 3. Pharmacy will sign off and monitor peripherally for any complications or dose adjustment needs  Alycia Rossetti, PharmD, BCPS Clinical Pharmacist Pager: [phone] 12/21/2014 8:52 AM

## 2014-12-21 NOTE — Progress Notes (Signed)
Report called to [facility]. S/W Yetta Numbers, Farley Ly RN

## 2014-12-21 NOTE — Discharge Summary (Signed)
Discharge Summary  [name] BLT:[id number] DOB: February 28, 1961  PCP: [name], MD  Admit date: 12/13/2014 Discharge date: 12/21/2014  Time spent: >35mns  Recommendations for Outpatient Follow-up:  1. F/u with PMD within a week for hospital discharge follow up, please repeat cbc/bmp/mag at follow up. Please monitor xarelto use, reeval in three months to determine duration of anticoagulation. 2. F/u with urology Dr. mMatilde Sprangin two weeks for left UPJ stone, pmd to coordinate with urology for anticoagulation use if urological procedure planned. 3.   F/u with [facility] for Multiple sclerosis  Discharge Diagnoses:  Active Hospital Problems   Diagnosis Date Noted  . At high risk for severe sepsis 12/13/2014  . Pressure ulcer 12/15/2014  . Acute renal failure   . Septic shock 12/14/2014  . AKI (acute kidney injury) 12/13/2014  . Metabolic acidosis 000/76/2263 . Sepsis 12/13/2014  . High blood pressure   . Multiple sclerosis diagnosed 2002-not on Therapy any longer 06/26/2012  . Diabetes mellitus 06/26/2012    Resolved Hospital Problems   Diagnosis Date Noted Date Resolved  No resolved problems to display.    Discharge Condition: stable  Diet recommendation: heart healthy/carb modified  Filed Weights   12/15/14 0606 12/16/14 0500 12/16/14 1454  Weight: 62.6 kg (138 lb 0.1 oz) 63.6 kg (140 lb 3.4 oz) 64.683 kg (142 lb 9.6 oz)    History of present illness:  54y/o ? known h/o Multiple sclerosis since 1995-diagnosed formally 2002-previously on Betaseron-WC bound from 2008 followed by Dr. YZenovia JordanNeurology-[place]4/2014 indicating patient did not like Betaseron and that she might not benefit from MOdifying therapy anymore, DM ty2, Mild anemia, Prior episodes Pyelonephritis, incisional hernia from a prior Fibroidectomy s/p repair, prior PNA 06/2012  History severely limited as patient does not seem to want to cooperate with telling me what brought  her here. Apparently her neighbor JJenene Slickerat phone #[phone](I was not able to reach her on telephone) went to visit her 12/13/14 and noted that she did not seem to be doing well. Physically she is limited in terms of what she can do but usually looks better. EMS was called and patient initially called it off herself but finally relented to coming to the emergency room to getting some IV fluids. From what I'm able to gather from nursing report and EMS report-patient Has not been doing well for about 1-2 weeks. Patient was found to be hypertensive 70/50 and 500 cc normal saline and Zofran were given. She was not really verbal to the emergency room physician  Nursing and pharmacy technician reports that the patient has not been taking her medications for at least the past 2 weeks. The last time she allegedly took her Lasix was 12/11/14. She does not appear to have been seen recently by any of her primary physicians including her neurologist.  On admission noted blood pressure 88/48, pulse rate 110, respiratory rate 21 and MAXIMUM TEMPERATURE 98  Potassium 5.7, BUN 118/creatinine 4.7, calcium 10.6, AST 13, ALT 12, alkaline 3.1, BNP 56, point of care troponin 0.03, lactic acid 1.9, WBC 18,: 12.2, platelets 321  Portable chest x-ray normal EKG = sinus tachycardia potential rate related changes here and full 0 point 16 QRS axis 90, peak T waves across V2 V3 V4 compared to EKG 06/2012 no T-wave inversions  Nursing reports on placement of the catheter there was purulent urine was obtained.  Hospital Course:  Principal Problem:   At high risk for severe sepsis  Active Problems:   Multiple sclerosis diagnosed 2002-not on Therapy any longer   Diabetes mellitus   High blood pressure   AKI (acute kidney injury)   Metabolic acidosis   Sepsis   Septic shock   Pressure ulcer   Acute renal failure  Septic shock secondary to uti/pyelonephritis: she was initially admitted to hosptialist service  on 7/2 , was transferred to ICU and required pressor support and icu care from 7/3 tp 7/5. Improved, transferred to hospitalist service on 7/6.  Finished abx treatment, tapered off stress dose steroids. Blood culture negative. bp stable, leukocytosis at discharge thought to be due to steroids, trending down.   Proteus uti/phelonephritis/ARF/left upj stone/chronic neurogenic bladder/reported intermittent urinary retention and urinary frequency:  cr back to normal, finished abx treatment, patient preferred foley, agree with voiding trial prior to discharge. May need intermittent foley.  urology consulted on 7/6, Dr. Matilde Sprang recommended outpatient urology follow up.  PMD need to coordinate with Dr. Matilde Sprang About xarelto use if procedure planned.   Incidental finding of DVT (a new L-external iliac and common femoral vein thrombus): Was treated with heparin drip initially transitioned to xarelto From 7/7.  xarelto 53m po bid for total of 22 days, then change to 274mpo qd with supper from 7/28. pmd to re evaluation in three month to determine duration of anticoagulation.   Metabolic encephalopathy: Initially was very confused, showed steady improvement Some visual hallucination on 7/7 7/8 tangential thought process, but seems less confused, very pleasant. 7/9 showed continued improvement in mental status, less confused, very pleasant, still poor short term memory. 7/10, confusion largely resolved, aaox4, persistent short term memory impairment.  Electrolytes abnormalities: hypokalemia, hypomagnesemia:  K/mag replaced. Stable at discharge  Elevated transaminases:with normal alk phos, likely from sepsis/septic shock, does has cholelithiasis on CT ab, but no symptom. Acute hepatitis panel negative Hold statin for now. Likely able to restart at discharge. lft returned to normal, statin restarted  noninsulin dependent diabetes: metformin/glipizide held in the hospital, metformin  restarted at discharge, pmd to continue monitor blood sugar.  HTN:  bp meds held initially due to sepsis,  restarted lisinopril from 7/7.    H/o MS, wheelchair bound for 5years, neurogenic bladder reported intermittent urinary retention/urinary frequency, reported able to use bedside commode for bm. Need two person assist for transfer. discharge to snf and urology/neurology follow up.  Code Status: full  Family Communication: patient's cousin miVista Minkn room. Family agree with placement. MiVista Mink [phone]) DeIsaiah [phone])  Disposition Plan: SNF on 7/10   INDWELLING DEVICES:: R femoral CVL 7/03 >> 7/05  MICRO DATA: MRSA PCR 7/02 >> NEGative Urine 7/02 >> proteus  Blood 7/03 >>    ANTIMICROBIALS:  Vanc 7/03 >> 7/04 Pip-tazo 7/03 >> 7/05 Ceftriaxone 7/02 >> 7/03, 7/05 >> 7/8  Consultants:  pccm  urology   Discharge Exam: BP 115/53 mmHg  Pulse 75  Temp(Src) 98.6 F (37 C) (Oral)  Resp 16  Ht '5\' 6"'  (1.676 m)  Wt 64.683 kg (142 lb 9.6 oz)  BMI 23.03 kg/m2  SpO2 97%  General: aaox3, impaired short term memory Cardiovascular: RRR, +murmur Respiratory: diminished at bases Ab: + bs, NT/ND Musculoskeletal: No Edema,. Chronic bilateral foot drop Neuro: chronic changes related to MS. (lower extremity weakness, neurogenic bladder), aaox3, confusion largely resolved, poor short term recall 2/3.   Discharge Instructions You were cared for by a hospitalist during your hospital stay. If you have any questions about  your discharge medications or the care you received while you were in the hospital after you are discharged, you can call the unit and asked to speak with the hospitalist on call if the hospitalist that took care of you is not available. Once you are discharged, your primary care physician will handle any further medical issues. Please note that NO REFILLS for any discharge medications will be authorized once you  are discharged, as it is imperative that you return to your primary care physician (or establish a relationship with a primary care physician if you do not have one) for your aftercare needs so that they can reassess your need for medications and monitor your lab values.      Discharge Instructions    Diet - low sodium heart healthy    Complete by:  As directed   Carb monidifed     Increase activity slowly    Complete by:  As directed             Medication List    STOP taking these medications        doxycycline 100 MG capsule  Commonly known as:  VIBRAMYCIN     glipiZIDE 5 MG tablet  Commonly known as:  GLUCOTROL     menthol-cetylpyridinium 3 MG lozenge  Commonly known as:  CEPACOL      TAKE these medications        albuterol 108 (90 BASE) MCG/ACT inhaler  Commonly known as:  PROVENTIL HFA;VENTOLIN HFA  Inhale 2 puffs into the lungs every 4 (four) hours as needed for wheezing or shortness of breath.     aspirin EC 81 MG tablet  Take 81 mg by mouth daily.     baclofen 10 MG tablet  Commonly known as:  LIORESAL  Take 10 mg by mouth 2 (two) times daily.     buPROPion 150 MG 12 hr tablet  Commonly known as:  WELLBUTRIN SR  Take 150 mg by mouth 2 (two) times daily.     feeding supplement (ENSURE ENLIVE) Liqd  Take 237 mLs by mouth 2 (two) times daily between meals.     ferrous sulfate 325 (65 FE) MG tablet  Take 1 tablet (325 mg total) by mouth 2 (two) times daily.     furosemide 40 MG tablet  Commonly known as:  LASIX  Take 1 tablet (40 mg total) by mouth every three (3) days as needed for edema.     lactobacillus Pack  Take 1 packet (1 g total) by mouth 3 (three) times daily with meals.     lisinopril 2.5 MG tablet  Commonly known as:  PRINIVIL,ZESTRIL  Take 2.5 mg by mouth daily.     lovastatin 20 MG tablet  Commonly known as:  MEVACOR  Take 20 mg by mouth daily.     metFORMIN 500 MG tablet  Commonly known as:  GLUCOPHAGE  Take 500 mg by mouth 2  (two) times daily.     potassium chloride SA 20 MEQ tablet  Commonly known as:  K-DUR,KLOR-CON  Take 1 tablet (20 mEq total) by mouth daily.     Rivaroxaban 15 & 20 MG Tbpk  Commonly known as:  XARELTO STARTER PACK  Take as directed on package: Start with one 72m tablet by mouth twice a day with food. On Day 22, switch to one 258mtablet once a day with food.     solifenacin 5 MG tablet  Commonly known as:  VESICARE  Take 5 mg  by mouth daily.     tiZANidine 4 MG tablet  Commonly known as:  ZANAFLEX  Take 4 mg by mouth 2 (two) times daily.       No Known Allergies Follow-up Information    Follow up with MACDIARMID,SCOTT A, MD In 2 weeks.   Specialty:  Urology   Why:  left upj stone   Contact information:   [address] [place] [address] [phone]       Follow up with [name], MD In 1 week.   Specialty:  Internal Medicine   Why:  hospital discharge follow up, repeat cbc/bmp   Contact information:   [address] [place] [address] [phone]       Follow up with Marcial Pacas, MD.   Specialty:  Neurology   Why:  multiple sclerosis   Contact information:   [place]  [address] [phone]        The results of significant diagnostics from this hospitalization (including imaging, microbiology, ancillary and laboratory) are listed below for reference.    Significant Diagnostic Studies: Ct Abdomen Pelvis W Contrast  12/15/2014   CLINICAL DATA:  Sepsis. Likely pyelonephritis. Multiple sclerosis. Hyperglycemia. Diabetes.  EXAM: CT ABDOMEN AND PELVIS WITH CONTRAST  TECHNIQUE: Multidetector CT imaging of the abdomen and pelvis was performed using the standard protocol following bolus administration of intravenous contrast.  CONTRAST:  74m OMNIPAQUE IOHEXOL 300 MG/ML  SOLN  COMPARISON:  Abdominal ultrasound of 12/14/2014  FINDINGS: Lower chest: Degradation secondary to patient arm position, not raised above the head. 3  mm anterior right lung base nodule on image 4. Motion degradation. Left base atelectasis. Normal heart size. Trace left larger than right pleural effusions.  Hepatobiliary: Scattered too small to characterize liver lesions which are likely small cysts or bile duct hamartomas. Normal gallbladder, without biliary ductal dilatation.  Pancreas: Normal, without mass or ductal dilatation.  Spleen: Normal  Adrenals/Urinary Tract: Normal right adrenal gland. Mild left adrenal thickening. Left renal collecting system stone within the lower pole measures 1.4 cm. A stone within the left ureteropelvic junction measures maximally 1.6 cm. There is minimal upstream caliectasis, as well as a left renal sinus cyst which measures 5.0 x 3.4 cm.  An interpolar right renal cortical cyst measures 2.3 cm. Proximal ureteric and renal pelvic wall thickening/ hyper enhancement is identified bilaterally. Subtly heterogeneous right renal enhancement on delayed images suggests pyelonephritis. The ureteric hyperenhancement suggests ascending infection. Urinary bladder collapsed around a Foley catheter.  Stomach/Bowel: Normal stomach, without wall thickening. Normal colon, appendix, and terminal ileum. Normal small bowel.  Vascular/Lymphatic: Aortic and branch vessel atherosclerosis. Age advanced. Increased number of small retroperitoneal nodes which are likely reactive. None are pathologic by size criteria. Prominent but not pathologically enlarged pelvic sidewall nodes as well.  Filling defect within the left external iliac and common femoral vein, including on images 72-84.  Reproductive: Uterine fibroids.  No adnexal mass.  Other: No significant free fluid. A right femoral venous catheter is identified.  Musculoskeletal: No acute osseous abnormality.  IMPRESSION: 1. Ureteric and renal pelvic wall thickening/hyper enhancement, suspicious for ascending infection. Subtle heterogeneous right renal enhancement is suspicious for pyelonephritis. 2.  Left nephrolithiasis, include left ureteropelvic junction dominant stone. Relatively mild upstream caliectasis. 3. Nonocclusive thrombus within the left external iliac and common femoral veins. These results were called by telephone at the time of interpretation on 12/15/2014 at 7:32 pm to DTimmothy Sours r.n., who verbally acknowledged these results. 4. Uterine  fibroids. 5. Trace bilateral pleural effusions. 6. Motion and position degraded exam. 7. Tiny right lung base nodule. If the patient is at high risk for bronchogenic carcinoma, follow-up chest CT at 1 year is recommended. If the patient is at low risk, no follow-up is needed. This recommendation follows the consensus statement: "Guidelines for Management of Small Pulmonary Nodules Detected on CT Scans: A Statement from the [place]" as published in Radiology 2005; 237:395-400. Available online at: https://www.arnold.com/.   Electronically Signed   By: Abigail Miyamoto M.D.   On: 12/15/2014 19:35   US Renal  12/14/2014   CLINICAL DATA:  Acute renal failure.  Pyelonephritis.  EXAM: RENAL / URINARY TRACT ULTRASOUND COMPLETE  COMPARISON:  None.  FINDINGS: Right Kidney:  Length: 15.1 cm. Mildly increased echogenicity. Upper pole right renal cyst of 3.1 cm. Question renal pelvic wall thickening, including on image 17. 1.4 cm interpolar right renal cyst.  Left Kidney:  Length: 14.5 cm. Mildly increased echogenicity. Multiple collecting system stones, including a 2.3 cm stone on image 31. A central renal lesion which is likely a cyst or complex cyst measures 3.6 cm.  Bladder:  Foley catheter within. The bladder is not entirely decompressed. Wall thickening including at 1.7 cm.  Incidental note is made of gallstones.  IMPRESSION: 1. Increased renal echogenicity, suggesting medical renal disease. 2. Left-sided renal calculi. 3. Suggestion of right renal pelvic wall thickening, possibly representing ascending infection. 4. Right renal cyst.  Left renal lesion which is likely a cyst or complex cyst. 5. Consider further evaluation with CT to evaluate the above findings. Ideally, this could be performed with and without contrast. If patient's renal function will not allow contrast, stone study could be performed. 6. Foley catheter within the urinary bladder, not decompressed. this suggests catheter dysfunction. Concurrent wall thickening could represent bladder outlet obstruction or cystitis. 7. Cholelithiasis.   Electronically Signed   By: Abigail Miyamoto M.D.   On: 12/14/2014 09:24   Dg Chest Port 1 View  12/14/2014   CLINICAL DATA:  Central line placement. Multiple sclerosis. High blood pressure.  EXAM: PORTABLE CHEST - 1 VIEW  COMPARISON:  Earlier today at 130 hours.  FINDINGS: 654 hours. Numerous leads and wires project over the chest. No central line is identified. Midline trachea. Normal heart size. No pleural effusion or pneumothorax. Clear lungs. No congestive failure.  IMPRESSION: Despite the clinical history, no new central line is identified.  No acute cardiopulmonary disease.   Electronically Signed   By: Abigail Miyamoto M.D.   On: 12/14/2014 09:17   Dg Chest Port 1 View  12/14/2014   CLINICAL DATA:  Acute onset of shortness of breath. Sepsis. Initial encounter.  EXAM: PORTABLE CHEST - 1 VIEW  COMPARISON:  Chest radiograph from 12/13/2014  FINDINGS: The lungs are well-aerated and clear. There is no evidence of focal opacification, pleural effusion or pneumothorax.  The cardiomediastinal silhouette is within normal limits. No acute osseous abnormalities are seen.  IMPRESSION: No acute cardiopulmonary process seen.   Electronically Signed   By: Garald Balding M.D.   On: 12/14/2014 02:21   Dg Chest Portable 1 View  12/13/2014   CLINICAL DATA:  Altered mental status.  Hypotension.  EXAM: PORTABLE CHEST - 1 VIEW  COMPARISON:  06/27/2012  FINDINGS: Heart size and pulmonary vascularity are normal and the lungs are clear. No osseous abnormality.   IMPRESSION: Normal chest.   Electronically Signed   By: Lorriane Shire M.D.   On: 12/13/2014 15:55  Microbiology: Recent Results (from the past 240 hour(s))  Urine culture     Status: None   Collection Time: 12/13/14  4:45 PM  Result Value Ref Range Status   Specimen Description URINE, CATHETERIZED  Final   Special Requests NONE  Final   Culture >=100,000 COLONIES/mL PROTEUS MIRABILIS  Final   Report Status 12/15/2014 FINAL  Final   Organism ID, Bacteria PROTEUS MIRABILIS  Final      Susceptibility   Proteus mirabilis - MIC*    AMPICILLIN >=32 RESISTANT Resistant     CEFAZOLIN <=4 SENSITIVE Sensitive     CEFTRIAXONE <=1 SENSITIVE Sensitive     CIPROFLOXACIN >=4 RESISTANT Resistant     GENTAMICIN <=1 SENSITIVE Sensitive     IMIPENEM 1 SENSITIVE Sensitive     NITROFURANTOIN 128 RESISTANT Resistant     TRIMETH/SULFA >=320 RESISTANT Resistant     AMPICILLIN/SULBACTAM >=32 RESISTANT Resistant     PIP/TAZO <=4 SENSITIVE Sensitive     * >=100,000 COLONIES/mL PROTEUS MIRABILIS  Gram stain     Status: None   Collection Time: 12/13/14  4:45 PM  Result Value Ref Range Status   Specimen Description URINE, CATHETERIZED  Final   Special Requests NONE  Final   Gram Stain   Final    WBC SEEN POLYMORPHONUCLEAR GRAM POSITIVE COCCI GRAM NEGATIVE RODS CYTOSPUN SLIDE CALLED RN B JACOBELLI AT [phone] [place]    Report Status 12/14/2014 FINAL  Final  MRSA PCR Screening     Status: None   Collection Time: 12/13/14  9:45 PM  Result Value Ref Range Status   MRSA by PCR NEGATIVE NEGATIVE Final    Comment:        The GeneXpert MRSA Assay (FDA approved for NASAL specimens only), is one component of a comprehensive MRSA colonization surveillance program. It is not intended to diagnose MRSA infection nor to guide or monitor treatment for MRSA infections.   Culture, blood (routine x 2)     Status: None   Collection Time: 12/14/14  5:35 AM  Result Value Ref Range Status   Specimen  Description BLOOD CENTRAL LINE  Final   Special Requests BOTTLES DRAWN AEROBIC AND ANAEROBIC 10CC EACH  Final   Culture NO GROWTH 5 DAYS  Final   Report Status 12/19/2014 FINAL  Final  Culture, blood (routine x 2)     Status: None   Collection Time: 12/14/14  5:35 AM  Result Value Ref Range Status   Specimen Description BLOOD CENTRAL LINE  Final   Special Requests BOTTLES DRAWN AEROBIC AND ANAEROBIC 10CC EACH  Final   Culture NO GROWTH 5 DAYS  Final   Report Status 12/19/2014 FINAL  Final     Labs: Basic Metabolic Panel:  Recent Labs Lab 12/16/14 0436 12/17/14 1632 12/18/14 0523 12/19/14 0420 12/20/14 0255 12/21/14 0425  NA 136 138 138 140 139 139  K 3.4* 4.0 3.3* 3.6 3.6 3.7  CL 112* 110 109 111 108 107  CO2 17* 21* '24 25 25 27  ' GLUCOSE 126* 167* 106* 85 81 87  BUN '13 8 8 10 12 7  ' CREATININE 0.86 0.75 0.61 0.67 0.56 0.61  CALCIUM 7.0* 8.4* 8.5* 8.8* 8.8* 8.7*  MG 1.0* 1.8 1.6* 1.8 1.6* 1.8  PHOS 2.1*  --   --   --   --   --    Liver Function Tests:  Recent Labs Lab 12/17/14 1632 12/21/14 0425  AST 24 18  ALT 66* 38  ALKPHOS 99 67  BILITOT 0.3 0.3  PROT 5.8* 5.7*  ALBUMIN 2.1* 2.0*   No results for input(s): LIPASE, AMYLASE in the last 168 hours. No results for input(s): AMMONIA in the last 168 hours. CBC:  Recent Labs Lab 12/16/14 0436 12/17/14 1200 12/18/14 0523 12/19/14 0420 12/20/14 0255 12/21/14 0425  WBC 7.6 10.2 15.0* 15.5* 17.1* 16.3*  NEUTROABS 6.0  --   --   --   --   --   HGB 7.9* 10.3* 8.8* 8.6* 8.2* 8.4*  HCT 23.0* 30.1* 26.1* 25.4* 25.2* 25.0*  MCV 86.1 86.0 85.3 87.6 86.6 88.7  PLT 230 355 304 311 349 388   Cardiac Enzymes: No results for input(s): CKTOTAL, CKMB, CKMBINDEX, TROPONINI in the last 168 hours. BNP: BNP (last 3 results)  Recent Labs  12/13/14 1620  BNP 56.9    ProBNP (last 3 results) No results for input(s): PROBNP in the last 8760 hours.  CBG:  Recent Labs Lab 12/20/14 0736 12/20/14 1219 12/20/14 1647  12/20/14 2155 12/21/14 0759  GLUCAP 80 173* 213* 152* 78       Signed:  Lanasia Porras MD, PhD  [facility] 12/21/2014, 11:52 AM

## 2014-12-21 NOTE — Progress Notes (Signed)
Pt tx to [facility] via [facility] ambulance. With pt's permission,VM left forDenise Ysidro Evert (family member) per direction of unit CSW,  Pt agreeable to transfer.

## 2014-12-21 NOTE — Clinical Social Work Placement (Signed)
7/17  CLINICAL SOCIAL WORK PLACEMENT  NOTE  Date:  12/21/2014  Patient Details  Name: [name] MRN: [id number] Date of Birth: [birth year]  Clinical Social Work is seeking post-discharge placement for this patient at the [place] level of care (*CSW will initial, date and re-position this form in  chart as items are completed):  Yes   Patient/family provided with [facility] Work Department's list of facilities offering this level of care within the geographic area requested by the patient (or if unable, by the patient's family).  Yes   Patient/family informed of their freedom to choose among providers that offer the needed level of care, that participate in Medicare, Medicaid or managed care program needed by the patient, have an available bed and are willing to accept the patient.  Yes   Patient/family informed of [facility]'s ownership interest in [facility] and [facility], as well as of the fact that they are under no obligation to receive care at these facilities.  PASRR submitted to EDS on  (n/a)     PASRR number received on  (n/a)     Existing PASRR number confirmed on 12/17/14     FL2 transmitted to all facilities in geographic area requested by pt/family on 12/17/14     FL2 transmitted to all facilities within larger geographic area on  (n/a)     Patient informed that his/her managed care company has contracts with or will negotiate with certain facilities, including the following:   [facility], yes.)     Yes   Patient/family informed of bed offers received.  Patient chooses bed at [facility] recommends and patient chooses bed at  (n/a)    Patient to be transferred to [facility] on 12/21/14 Patient to be transferred to facility by  ambulance    Patient family notified on   of transfer yes VM left Name of family member notified:    [name]    PHYSICIAN Please sign  FL2     Additional Comment:    _______________________________________________ Roanna Raider, LCSW 12/21/2014, 2:57 PM

## 2014-12-21 NOTE — Progress Notes (Signed)
Awaiting decision from [facility]'s DON re: whether/not they can accept pt today.  CSW will continue to follow.

## 2014-12-22 ENCOUNTER — Encounter: Payer: Self-pay | Admitting: Internal Medicine

## 2014-12-22 ENCOUNTER — Non-Acute Institutional Stay (SKILLED_NURSING_FACILITY): Payer: Medicare Other | Admitting: Internal Medicine

## 2014-12-22 DIAGNOSIS — G35 Multiple sclerosis: Secondary | ICD-10-CM

## 2014-12-22 DIAGNOSIS — I10 Essential (primary) hypertension: Secondary | ICD-10-CM

## 2014-12-22 DIAGNOSIS — R740 Nonspecific elevation of levels of transaminase and lactic acid dehydrogenase [LDH]: Secondary | ICD-10-CM | POA: Diagnosis not present

## 2014-12-22 DIAGNOSIS — N390 Urinary tract infection, site not specified: Secondary | ICD-10-CM | POA: Diagnosis not present

## 2014-12-22 DIAGNOSIS — R6521 Severe sepsis with septic shock: Secondary | ICD-10-CM | POA: Diagnosis not present

## 2014-12-22 DIAGNOSIS — G9340 Encephalopathy, unspecified: Secondary | ICD-10-CM

## 2014-12-22 DIAGNOSIS — I82402 Acute embolism and thrombosis of unspecified deep veins of left lower extremity: Secondary | ICD-10-CM | POA: Diagnosis not present

## 2014-12-22 DIAGNOSIS — R7401 Elevation of levels of liver transaminase levels: Secondary | ICD-10-CM

## 2014-12-22 DIAGNOSIS — B964 Proteus (mirabilis) (morganii) as the cause of diseases classified elsewhere: Secondary | ICD-10-CM | POA: Diagnosis not present

## 2014-12-22 DIAGNOSIS — A419 Sepsis, unspecified organism: Secondary | ICD-10-CM | POA: Diagnosis not present

## 2014-12-22 DIAGNOSIS — R739 Hyperglycemia, unspecified: Secondary | ICD-10-CM | POA: Diagnosis not present

## 2014-12-22 DIAGNOSIS — E119 Type 2 diabetes mellitus without complications: Secondary | ICD-10-CM

## 2014-12-22 DIAGNOSIS — E878 Other disorders of electrolyte and fluid balance, not elsewhere classified: Secondary | ICD-10-CM

## 2014-12-22 NOTE — Progress Notes (Signed)
MRN: [id number] Name: [name]  Sex: female Age: 54 y.o. DOB: 01/04/1961  [place] #: [name] Facility/Room:108 Level Of Care: SNF Provider: Inocencio Homes D Emergency Contacts: Extended Emergency Contact Information Primary Emergency Contact: Paulino Rily, [place] [address] [place] Phone: [phone] Relation: Relative  Code Status: FULL  Allergies: Review of patient's allergies indicates no known allergies.  Chief Complaint  Patient presents with  . New Admit To SNF    HPI: Patient is 54 y.o. female who has MS, HTN and DM who was reported not taken her home meds for at least 2 weeks prior to hospitalization and who was admitted to hospital with Septic shock secondary to uti/pyelonephritis from 7/2-7/10. She was initially admitted to hosptialist service on 7/2 , was transferred to ICU and required pressor support and icu care from 7/3 tp 7/5. Improved, transferred to hospitalist service on 7/6. Finished abx treatment, tapered off stress dose steroids. Blood culture negative. bp stable, leukocytosis at discharge thought to be due to steroids, trending down,  Pt is now admitted to SNF  For generalized weakness 2/2 hospitalization and sepsis. Will order BMP to assess acute kidney injury 2/2 dehydration/sepsis to ensure BUN/Cr continue to normalize.Will follow DM with glucophage 500 mg BID, HTN  teated with lasix and lisinopril with f/u BMP for lytes and kidney fx and Hyperlipidemia, will cont on mevacor. Pt has new DVT dx and is continued on xarelto.  Will order CBC to ensure leukocytosis continues to rtend to normal.   Past Medical History  Diagnosis Date  . MS (multiple sclerosis)   . Hyperglycemia   . Edema   . High blood pressure   . Diabetes mellitus without complication   . Pyelonephritis 12/16/2014    Past Surgical History  Procedure Laterality Date  . Hernia mesh removal    . Tubes tided    . Tubal ligation        Medication  List       This list is accurate as of: 12/22/14 11:59 PM.  Always use your most recent med list.               albuterol 108 (90 BASE) MCG/ACT inhaler  Commonly known as:  PROVENTIL HFA;VENTOLIN HFA  Inhale 2 puffs into the lungs every 4 (four) hours as needed for wheezing or shortness of breath.     aspirin EC 81 MG tablet  Take 81 mg by mouth daily.     baclofen 10 MG tablet  Commonly known as:  LIORESAL  Take 10 mg by mouth 2 (two) times daily.     buPROPion 150 MG 12 hr tablet  Commonly known as:  WELLBUTRIN SR  Take 150 mg by mouth 2 (two) times daily.     feeding supplement (ENSURE ENLIVE) Liqd  Take 237 mLs by mouth 2 (two) times daily between meals.     ferrous sulfate 325 (65 FE) MG tablet  Take 1 tablet (325 mg total) by mouth 2 (two) times daily.     furosemide 40 MG tablet  Commonly known as:  LASIX  Take 1 tablet (40 mg total) by mouth every three (3) days as needed for edema.     lactobacillus Pack  Take 1 packet (1 g total) by mouth 3 (three) times daily with meals.     lisinopril 2.5 MG tablet  Commonly known as:  PRINIVIL,ZESTRIL  Take 2.5 mg by mouth daily.  lovastatin 20 MG tablet  Commonly known as:  MEVACOR  Take 20 mg by mouth daily.     metFORMIN 500 MG tablet  Commonly known as:  GLUCOPHAGE  Take 500 mg by mouth 2 (two) times daily.     potassium chloride SA 20 MEQ tablet  Commonly known as:  K-DUR,KLOR-CON  Take 1 tablet (20 mEq total) by mouth daily.     Rivaroxaban 15 & 20 MG Tbpk  Commonly known as:  XARELTO STARTER PACK  Take as directed on package: Start with one 19m tablet by mouth twice a day with food. On Day 22, switch to one 289mtablet once a day with food.     solifenacin 5 MG tablet  Commonly known as:  VESICARE  Take 5 mg by mouth daily.     tiZANidine 4 MG tablet  Commonly known as:  ZANAFLEX  Take 4 mg by mouth 2 (two) times daily.        No orders of the defined types were placed in this encounter.     Immunization History  Administered Date(s) Administered  . Influenza Split 06/26/2012    History  Substance Use Topics  . Smoking status: Former Smoker -- 1.00 packs/day for 20 years    Types: Cigarettes    Quit date: 11/19/2014  . Smokeless tobacco: Never Used  . Alcohol Use: 3.0 oz/week    5 Glasses of wine per week    Family history is + DM and dementia   Review of Systems  DATA OBTAINED: from patient, nurse GENERAL:  no fevers, fatigue, appetite changes SKIN: No itching, rash or wounds EYES: No eye pain, redness, discharge EARS: No earache, tinnitus, change in hearing NOSE: No congestion, drainage or bleeding  MOUTH/THROAT: No mouth or tooth pain, No sore throat RESPIRATORY: No cough, wheezing, SOB CARDIAC: No chest pain, palpitations, lower extremity edema  GI: No abdominal pain, No N/V/D or constipation, No heartburn or reflux  GU: No dysuria, frequency or urgency, or incontinence  MUSCULOSKELETAL: No unrelieved bone/joint pain NEUROLOGIC: No headache, dizziness or focal weakness PSYCHIATRIC: No c/o anxiety or sadness,  Filed Vitals:   12/28/14 1320  BP: 145/75  Pulse: 19  Temp: 97.4 F (36.3 C)  Resp: 77    Physical Exam  GENERAL APPEARANCE: Alert,  No acute distress.  SKIN: No diaphoresis rash HEAD: Normocephalic, atraumatic  EYES: Conjunctiva/lids clear. Pupils round, reactive. EOMs intact.  EARS: External exam WNL, canals clear. Hearing grossly normal.  NOSE: No deformity or discharge.  MOUTH/THROAT: Lips w/o lesions  RESPIRATORY: Breathing is even, unlabored. Lung sounds are clear   CARDIOVASCULAR: Heart RRR no murmurs, rubs or gallops. No peripheral edema.   GASTROINTESTINAL: Abdomen is soft, non-tender, not distended w/ normal bowel sounds. GENITOURINARY: Bladder non tender, not distended  MUSCULOSKELETAL: No abnormal joints or musculature NEUROLOGIC:  Cranial nerves 2-12 grossly intact  PSYCHIATRIC: Mood and affect appropriate to situation,  no behavioral issues  Patient Active Problem List   Diagnosis Date Noted  . Urinary tract infection due to Proteus 12/28/2014  . Acute encephalopathy 12/28/2014  . Electrolyte abnormality 12/28/2014  . DVT, lower extremity 12/28/2014  . Transaminitis 12/28/2014  . Pressure ulcer 12/15/2014  . Acute renal failure   . Septic shock 12/14/2014  . AKI (acute kidney injury) 12/13/2014  . At high risk for severe sepsis 12/13/2014  . Metabolic acidosis 0702/[id number]. Sepsis 12/13/2014  . MS (multiple sclerosis)   . Hyperglycemia   . Edema   .  High blood pressure   . Anemia 06/28/2012  . UTI (lower urinary tract infection) 06/26/2012  . Multiple sclerosis diagnosed 2002-not on Therapy any longer 06/26/2012  . Bilateral leg edema 06/26/2012  . Diabetes mellitus 06/26/2012  . Community acquired pneumonia 06/25/2012    CBC    Component Value Date/Time   WBC 16.3* 12/21/2014 0425   RBC 2.82* 12/21/2014 0425   HGB 8.4* 12/21/2014 0425   HCT 25.0* 12/21/2014 0425   PLT 388 12/21/2014 0425   MCV 88.7 12/21/2014 0425   LYMPHSABS 1.4 12/16/2014 0436   MONOABS 0.2 12/16/2014 0436   EOSABS 0.0 12/16/2014 0436   BASOSABS 0.0 12/16/2014 0436    CMP     Component Value Date/Time   NA 139 12/21/2014 0425   K 3.7 12/21/2014 0425   CL 107 12/21/2014 0425   CO2 27 12/21/2014 0425   GLUCOSE 87 12/21/2014 0425   BUN 7 12/21/2014 0425   CREATININE 0.61 12/21/2014 0425   CALCIUM 8.7* 12/21/2014 0425   PROT 5.7* 12/21/2014 0425   ALBUMIN 2.0* 12/21/2014 0425   AST 18 12/21/2014 0425   ALT 38 12/21/2014 0425   ALKPHOS 67 12/21/2014 0425   BILITOT 0.3 12/21/2014 0425   GFRNONAA >60 12/21/2014 0425   GFRAA >60 12/21/2014 0425   US Renal  12/14/2014   CLINICAL DATA:  Acute renal failure.  Pyelonephritis.  EXAM: RENAL / URINARY TRACT ULTRASOUND COMPLETE  COMPARISON:  None.  FINDINGS: Right Kidney:  Length: 15.1 cm. Mildly increased echogenicity. Upper pole right renal cyst of 3.1 cm.  Question renal pelvic wall thickening, including on image 17. 1.4 cm interpolar right renal cyst.  Left Kidney:  Length: 14.5 cm. Mildly increased echogenicity. Multiple collecting system stones, including a 2.3 cm stone on image 31. A central renal lesion which is likely a cyst or complex cyst measures 3.6 cm.  Bladder:  Foley catheter within. The bladder is not entirely decompressed. Wall thickening including at 1.7 cm.  Incidental note is made of gallstones.  IMPRESSION: 1. Increased renal echogenicity, suggesting medical renal disease. 2. Left-sided renal calculi. 3. Suggestion of right renal pelvic wall thickening, possibly representing ascending infection. 4. Right renal cyst. Left renal lesion which is likely a cyst or complex cyst. 5. Consider further evaluation with CT to evaluate the above findings. Ideally, this could be performed with and without contrast. If patient's renal function will not allow contrast, stone study could be performed. 6. Foley catheter within the urinary bladder, not decompressed. this suggests catheter dysfunction. Concurrent wall thickening could represent bladder outlet obstruction or cystitis. 7. Cholelithiasis.   Electronically Signed   By: Abigail Miyamoto M.D.   On: 12/14/2014 09:24   Dg Chest Port 1 View  12/14/2014   CLINICAL DATA:  Central line placement. Multiple sclerosis. High blood pressure.  EXAM: PORTABLE CHEST - 1 VIEW  COMPARISON:  Earlier today at 130 hours.  FINDINGS: 654 hours. Numerous leads and wires project over the chest. No central line is identified. Midline trachea. Normal heart size. No pleural effusion or pneumothorax. Clear lungs. No congestive failure.  IMPRESSION: Despite the clinical history, no new central line is identified.  No acute cardiopulmonary disease.   Electronically Signed   By: Abigail Miyamoto M.D.   On: 12/14/2014 09:17   Dg Chest Port 1 View  12/14/2014   CLINICAL DATA:  Acute onset of shortness of breath. Sepsis. Initial encounter.   EXAM: PORTABLE CHEST - 1 VIEW  COMPARISON:  Chest radiograph from  12/13/2014  FINDINGS: The lungs are well-aerated and clear. There is no evidence of focal opacification, pleural effusion or pneumothorax.  The cardiomediastinal silhouette is within normal limits. No acute osseous abnormalities are seen.  IMPRESSION: No acute cardiopulmonary process seen.   Electronically Signed   By: Garald Balding M.D.   On: 12/14/2014 02:21   Dg Chest Portable 1 View  12/13/2014   CLINICAL DATA:  Altered mental status.  Hypotension.  EXAM: PORTABLE CHEST - 1 VIEW  COMPARISON:  06/27/2012  FINDINGS: Heart size and pulmonary vascularity are normal and the lungs are clear. No osseous abnormality.  IMPRESSION: Normal chest.   Electronically Signed   By: Lorriane Shire M.D.   On: 12/13/2014 15:55   Not all labs, radiology exams or other studies done during hospitalization come through on my EPIC note; however they are reviewed by me.   Assessment and Plan  Septic shock 2/2 pylonephritis. S/p treatment with pressors in ICU,and IV abx, stress steroids; Tx complete prior to admit to SNF  Urinary tract infection due to Proteus ARF/left upj stone/chronic neurogenic bladder/reported intermittent urinary retention and urinary frequency: , finished abx treatment, patient preferred foley, agree with voiding trial prior to discharge. May need intermittent foley. urology consulted on 7/6, Dr. Matilde Sprang recommended outpatient urology follow up.SNF plan - repeat BMP 1 week and outpt urology f/u  Acute encephalopathy Some visual hallucination on 7/7 7/8 tangential thought process, but seems less confused, very pleasant. 7/9 showed continued improvement in mental status, less confused, very pleasant, still poor short term memory. 7/10, confusion largely resolved, aaox4, persistent short term memory impairment  Electrolyte abnormality : hypokalemia, hypomagnesemia in hospital K/mag replaced; SNF plan - repeat BMP and Mg+ 1  week  DVT, lower extremity (a new L-external iliac and common femoral vein thrombus): Was treated with heparin drip initially transitioned to xarelto From 7/7.  SNF plan - xarelto 49m po bid for total of 22 days, then change to 264mpo qd with supper from 7/28; re evaluation in three month to determine duration of anticoagulation.  Transaminitis with normal alk phos, likely from sepsis/septic shock, does has cholelithiasis on CT ab, but no symptom. Acute hepatitis panel negative SNF plan - Mevacor 20 mg restarted and check CMP 1 week  Multiple sclerosis diagnosed 2002-not on Therapy any longer wheelchair bound for 5years, neurogenic bladder reported intermittent urinary retention/urinary frequency, reported able to use bedside commode for bm. Need two person assist for transfer. discharge to snf and urology/neurology follow up.   High blood pressure Stable;SNF plan - resume lisinopril 2.5 mg  Diabetes mellitus Stable ;SNF plan - resume glucophage 500 mg BID  Hyperglycemia  Resume mevacor 40 mg daily;f/u CMP in 1 week    ALHennie DuosMD

## 2014-12-28 ENCOUNTER — Encounter: Payer: Self-pay | Admitting: Internal Medicine

## 2014-12-28 DIAGNOSIS — I82409 Acute embolism and thrombosis of unspecified deep veins of unspecified lower extremity: Secondary | ICD-10-CM | POA: Insufficient documentation

## 2014-12-28 DIAGNOSIS — G9340 Encephalopathy, unspecified: Secondary | ICD-10-CM | POA: Insufficient documentation

## 2014-12-28 NOTE — Assessment & Plan Note (Signed)
Stable;SNF plan - resume lisinopril 2.5 mg

## 2014-12-28 NOTE — Assessment & Plan Note (Signed)
Resume mevacor 40 mg daily;f/u CMP in 1 week

## 2014-12-28 NOTE — Assessment & Plan Note (Signed)
Stable ;SNF plan - resume glucophage 500 mg BID

## 2014-12-28 NOTE — Assessment & Plan Note (Signed)
ARF/left upj stone/chronic neurogenic bladder/reported intermittent urinary retention and urinary frequency: , finished abx treatment, patient preferred foley, agree with voiding trial prior to discharge. May need intermittent foley. urology consulted on 7/6, Dr. Matilde Sprang recommended outpatient urology follow up.SNF plan - repeat BMP 1 week and outpt urology f/u

## 2014-12-28 NOTE — Assessment & Plan Note (Addendum)
with normal alk phos, likely from sepsis/septic shock, does has cholelithiasis on CT ab, but no symptom. Acute hepatitis panel negative SNF plan - Mevacor 20 mg restarted and check CMP 1 week

## 2014-12-28 NOTE — Assessment & Plan Note (Signed)
2/2 pylonephritis. S/p treatment with pressors in ICU,and IV abx, stress steroids; Tx complete prior to admit to SNF

## 2014-12-28 NOTE — Assessment & Plan Note (Signed)
wheelchair bound for 5years, neurogenic bladder reported intermittent urinary retention/urinary frequency, reported able to use bedside commode for bm. Need two person assist for transfer. discharge to snf and urology/neurology follow up.

## 2014-12-28 NOTE — Assessment & Plan Note (Signed)
:   hypokalemia, hypomagnesemia in hospital K/mag replaced; SNF plan - repeat BMP and Mg+ 1 week

## 2014-12-28 NOTE — Assessment & Plan Note (Signed)
(  a new L-external iliac and common femoral vein thrombus): Was treated with heparin drip initially transitioned to xarelto From 7/7.  SNF plan - xarelto 15mg  po bid for total of 22 days, then change to 20mg  po qd with supper from 7/28; re evaluation in three month to determine duration of anticoagulation.

## 2014-12-28 NOTE — Assessment & Plan Note (Signed)
Some visual hallucination on 7/7 7/8 tangential thought process, but seems less confused, very pleasant. 7/9 showed continued improvement in mental status, less confused, very pleasant, still poor short term memory. 7/10, confusion largely resolved, aaox4, persistent short term memory impairment

## 2014-12-31 ENCOUNTER — Observation Stay (HOSPITAL_COMMUNITY)
Admission: EM | Admit: 2014-12-31 | Discharge: 2015-01-03 | Disposition: A | Discharge status: Skilled Nursing Facility | Payer: Medicare Other | Attending: Family Medicine | Admitting: Family Medicine

## 2014-12-31 ENCOUNTER — Encounter (HOSPITAL_COMMUNITY): Payer: Self-pay | Admitting: Emergency Medicine

## 2014-12-31 DIAGNOSIS — Z7982 Long term (current) use of aspirin: Secondary | ICD-10-CM | POA: Insufficient documentation

## 2014-12-31 DIAGNOSIS — T39395A Adverse effect of other nonsteroidal anti-inflammatory drugs [NSAID], initial encounter: Secondary | ICD-10-CM | POA: Diagnosis not present

## 2014-12-31 DIAGNOSIS — Z86718 Personal history of other venous thrombosis and embolism: Secondary | ICD-10-CM | POA: Insufficient documentation

## 2014-12-31 DIAGNOSIS — I10 Essential (primary) hypertension: Secondary | ICD-10-CM | POA: Diagnosis not present

## 2014-12-31 DIAGNOSIS — L8990 Pressure ulcer of unspecified site, unspecified stage: Secondary | ICD-10-CM | POA: Diagnosis not present

## 2014-12-31 DIAGNOSIS — I150 Renovascular hypertension: Secondary | ICD-10-CM | POA: Diagnosis not present

## 2014-12-31 DIAGNOSIS — I82409 Acute embolism and thrombosis of unspecified deep veins of unspecified lower extremity: Secondary | ICD-10-CM | POA: Diagnosis present

## 2014-12-31 DIAGNOSIS — E119 Type 2 diabetes mellitus without complications: Secondary | ICD-10-CM | POA: Diagnosis not present

## 2014-12-31 DIAGNOSIS — K922 Gastrointestinal hemorrhage, unspecified: Principal | ICD-10-CM | POA: Insufficient documentation

## 2014-12-31 DIAGNOSIS — D500 Iron deficiency anemia secondary to blood loss (chronic): Secondary | ICD-10-CM | POA: Diagnosis not present

## 2014-12-31 DIAGNOSIS — N179 Acute kidney failure, unspecified: Secondary | ICD-10-CM | POA: Insufficient documentation

## 2014-12-31 DIAGNOSIS — Z79899 Other long term (current) drug therapy: Secondary | ICD-10-CM | POA: Insufficient documentation

## 2014-12-31 DIAGNOSIS — R319 Hematuria, unspecified: Secondary | ICD-10-CM | POA: Diagnosis not present

## 2014-12-31 DIAGNOSIS — D649 Anemia, unspecified: Secondary | ICD-10-CM

## 2014-12-31 DIAGNOSIS — G35 Multiple sclerosis: Secondary | ICD-10-CM | POA: Diagnosis not present

## 2014-12-31 DIAGNOSIS — Z87891 Personal history of nicotine dependence: Secondary | ICD-10-CM | POA: Diagnosis not present

## 2014-12-31 LAB — BASIC METABOLIC PANEL
Anion gap: 7 (ref 5–15)
BUN: 22 mg/dL — ABNORMAL HIGH (ref 6–20)
CO2: 24 mmol/L (ref 22–32)
Calcium: 9.8 mg/dL (ref 8.9–10.3)
Chloride: 115 mmol/L — ABNORMAL HIGH (ref 101–111)
Creatinine, Ser: 0.89 mg/dL (ref 0.44–1.00)
GFR calc Af Amer: >60 mL/min (ref >60)
GFR calc non Af Amer: >60 mL/min (ref >60)
Glucose, Bld: 98 mg/dL (ref 65–99)
Potassium: 3.9 mmol/L (ref 3.5–5.1)
Sodium: 146 mmol/L — ABNORMAL HIGH (ref 135–145)

## 2014-12-31 LAB — CBC WITH DIFFERENTIAL/PLATELET
Basophils Absolute: 0.1 10*3/uL (ref 0.0–0.1)
Basophils Relative: 1 % (ref 0–1)
Eosinophils Absolute: 0.4 10*3/uL (ref 0.0–0.7)
Eosinophils Relative: 3 % (ref 0–5)
HCT: 20.4 % — ABNORMAL LOW (ref 36.0–46.0)
Hemoglobin: 6.6 g/dL — CL (ref 12.0–15.0)
Lymphocytes Relative: 29 % (ref 12–46)
Lymphs Abs: 3.1 10*3/uL (ref 0.7–4.0)
MCH: 29.2 pg (ref 26.0–34.0)
MCHC: 32.4 g/dL (ref 30.0–36.0)
MCV: 90.3 fL (ref 78.0–100.0)
Monocytes Absolute: 0.7 10*3/uL (ref 0.1–1.0)
Monocytes Relative: 7 % (ref 3–12)
Neutro Abs: 6.4 10*3/uL (ref 1.7–7.7)
Neutrophils Relative %: 60 % (ref 43–77)
Platelets: 353 10*3/uL (ref 150–400)
RBC: 2.26 MIL/uL — ABNORMAL LOW (ref 3.87–5.11)
RDW: 17.7 % — ABNORMAL HIGH (ref 11.5–15.5)
WBC: 10.6 10*3/uL — ABNORMAL HIGH (ref 4.0–10.5)

## 2014-12-31 LAB — GLUCOSE, CAPILLARY: Glucose-Capillary: 122 mg/dL — ABNORMAL HIGH (ref 65–99)

## 2014-12-31 LAB — RETICULOCYTES
RBC.: 2.27 MIL/uL — ABNORMAL LOW (ref 3.87–5.11)
Retic Count, Absolute: 56.8 10*3/uL (ref 19.0–186.0)
Retic Ct Pct: 2.5 % (ref 0.4–3.1)

## 2014-12-31 LAB — VITAMIN B12: Vitamin B-12: 492 pg/mL (ref 180–914)

## 2014-12-31 LAB — ABO/RH: ABO/RH(D): A POS

## 2014-12-31 LAB — IRON AND TIBC
Iron: 82 ug/dL (ref 28–170)
Saturation Ratios: 31 % (ref 10.4–31.8)
TIBC: 263 ug/dL (ref 250–450)
UIBC: 181 ug/dL

## 2014-12-31 LAB — FERRITIN: Ferritin: 155 ng/mL (ref 11–307)

## 2014-12-31 LAB — FOLATE: FOLATE: 11.3 ng/mL (ref >5.9)

## 2014-12-31 LAB — PREPARE RBC (CROSSMATCH)

## 2014-12-31 MED ORDER — DARIFENACIN HYDROBROMIDE ER 7.5 MG PO TB24
7.5000 mg | ORAL_TABLET | Freq: Every day | ORAL | Status: DC
  Administered 2015-01-01 – 2015-01-03 (×3): 7.5 mg via ORAL
  Filled 2014-12-31 (×3): qty 1

## 2014-12-31 MED ORDER — RIVAROXABAN 20 MG PO TABS: 20.0000 mg | ORAL_TABLET | Freq: Every day | ORAL | Status: DC

## 2014-12-31 MED ORDER — SODIUM CHLORIDE 0.9 % IV SOLN
10.0000 mL/h | Freq: Once | INTRAVENOUS | Status: AC
  Administered 2014-12-31: 10 mL/h via INTRAVENOUS

## 2014-12-31 MED ORDER — RIVAROXABAN 15 MG PO TABS
15.0000 mg | ORAL_TABLET | Freq: Two times a day (BID) | ORAL | Status: DC
  Administered 2014-12-31 – 2015-01-03 (×6): 15 mg via ORAL
  Filled 2014-12-31 (×8): qty 1

## 2014-12-31 MED ORDER — TIZANIDINE HCL 4 MG PO TABS
4.0000 mg | ORAL_TABLET | Freq: Two times a day (BID) | ORAL | Status: DC
  Administered 2014-12-31 – 2015-01-03 (×6): 4 mg via ORAL
  Filled 2014-12-31 (×7): qty 1

## 2014-12-31 MED ORDER — INSULIN ASPART 100 UNIT/ML ~~LOC~~ SOLN
0.0000 [IU] | SUBCUTANEOUS | Status: DC
Start: 2014-12-31 — End: 2015-01-02
  Administered 2014-12-31 – 2015-01-01 (×2): 1 [IU] via SUBCUTANEOUS

## 2014-12-31 NOTE — Progress Notes (Signed)
[facility] History and Physical  [name]:[id number] DOB: 12/11/1960 DOA: 12/31/2014  Referring physician: ED PCP: [name], MD  Specialists: None yet  54 y/o ? known h/o Multiple sclerosis since 1995-diagnosed formally 2002-previously on Betaseron-WC bound from 2008 followed by Dr. Zenovia [facility] 09/2012 indicating patient did not like Betaseron and that she might not benefit from MOdifying therapy anymore, DM ty2, Mild anemia, Prior episodes Pyelonephritis, incisional hernia from a prior Fibroidectomy s/p repair, prior PNA 06/2012.  Was recently admitted by me 12/13/14 for severe sepsis and coinicdentally found to have LE DVT [L Ext Iliac, CFA] and placed on Xarelto for this indication on discharge from the hospital Patient had routine labs drawn at facility 7/19 and was noted to have hemoglobin 6.2 It was also noted that on discharge patient was kept on aspirin 81 mg in addition to novel oral anticoagulation agent as well  Patient herself states that she's had regular stools, has had no nausea no vomiting has had no chest pain has had no stomach pain and that she has a good diet She's not dizzy on sitting up although her baseline she does not move around and has not done so since 2008  She tells me she's had a colonoscopy in the past but does not remember the details as to what the result was.    Labs in the emergency room she confirmed hemoglobin below 7 at 6.6, WBC 10.6, platelet count 353 Sodium 146 chloride and 15 BUN 22/creatinine 0.8 Baseline BUN/creatinine on discharge on 7/10 was 7/0.6    Past Medical History  Diagnosis Date  . MS (multiple sclerosis)   . Hyperglycemia   . Edema   . High blood pressure   . Diabetes mellitus without complication   . Pyelonephritis 12/16/2014   Past Surgical History  Procedure Laterality Date  . Hernia mesh removal    . Tubes tided    . 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             No Known Allergies  Family History  Problem Relation Age of Onset  . Diabetes Mother   . Diabetes Father   . Diabetes Sister   . Scoliosis Sister   . Alzheimer's disease Mother     Prior to Admission medications   Medication Sig Start Date End Date Taking? Authorizing Provider  albuterol (PROVENTIL HFA;VENTOLIN HFA) 108 (90 BASE) MCG/ACT inhaler Inhale 2 puffs into the lungs every 4 (four) hours as needed for wheezing or shortness of breath. Patient taking differently: Inhale 2 puffs into the lungs 2 (two) times daily as needed for wheezing or shortness of breath. PROAIR 06/29/12  Yes [name], MD  aspirin EC 81 MG tablet Take 81 mg by mouth daily.    Yes Historical Provider, MD  baclofen (LIORESAL) 10 MG tablet Take 10 mg by mouth 2 (two) times daily.    Yes Historical Provider, MD  buPROPion (WELLBUTRIN SR) 150 MG 12 hr tablet Take 150 mg by mouth 2 (two) times daily. 11/21/14  Yes Historical Provider, MD  feeding supplement, ENSURE ENLIVE, (ENSURE ENLIVE) LIQD Take 237 mLs by mouth 2 (two) times daily between meals. 12/21/14  Yes Florencia Reasons, MD  ferrous sulfate 325 (65 FE) MG tablet Take 1 tablet (325 mg total)  by mouth 2 (two) times daily. 06/29/12  Yes [name], MD  Lactobacillus (ACIDOPHILUS PO) Take 1 tablet by mouth 3 (three) times daily.   Yes Historical Provider, MD  lisinopril (PRINIVIL,ZESTRIL) 2.5 MG tablet Take 2.5 mg by mouth daily. 12/05/14  Yes Historical Provider, MD  lovastatin (MEVACOR) 20 MG tablet Take 20 mg by mouth daily.   Yes Historical Provider, MD  metFORMIN (GLUCOPHAGE) 500 MG tablet Take 500 mg by mouth 2 (two) times daily. 12/08/14  Yes Historical Provider, MD  potassium chloride SA (K-DUR,KLOR-CON) 20 MEQ tablet Take 1 tablet (20 mEq total) by mouth daily. 12/21/14  Yes Florencia Reasons, MD  Rivaroxaban (XARELTO) 15  MG TABS tablet Take 15 mg by mouth 2 (two) times daily with a meal. For 21 days 12/25/14 01/15/15 Yes Historical Provider, MD  solifenacin (VESICARE) 5 MG tablet Take 5 mg by mouth daily.   Yes Historical Provider, MD  tiZANidine (ZANAFLEX) 4 MG tablet Take 4 mg by mouth 2 (two) times daily.    Yes Historical Provider, MD  furosemide (LASIX) 40 MG tablet Take 1 tablet (40 mg total) by mouth every three (3) days as needed for edema. Patient not taking: Reported on 12/31/2014 12/21/14   Florencia Reasons, MD  Rivaroxaban (XARELTO STARTER PACK) 15 & 20 MG TBPK Take as directed on package: Start with one 15mg  tablet by mouth twice a day with food. On Day 22, switch to one 20mg  tablet once a day with food. Patient not taking: Reported on 12/31/2014 12/21/14   Florencia Reasons, MD   Physical Exam: Filed Vitals:   12/31/14 1132  BP: 151/69  Pulse: 80  Temp: 98.5 F (36.9 C)  TempSrc: Oral  Resp: 18  SpO2: 100%   Alert coherent no distress pleasant No JVD no bruit, poor dentition, Mallampati 3 S1-S2 questionable murmur Abdomen is soft nontender with-stasis recti otherwise no epigastric tenderness no rebound Lower extremities are soft No edema no swelling She seems oriented and understands what's going on around her to a great extent Her neuro exam is unchanged from previously  Labs on Admission:  Basic Metabolic Panel:  Recent Labs Lab 12/31/14 1215  NA 146*  K 3.9  CL 115*  CO2 24  GLUCOSE 98  BUN 22*  CREATININE 0.89  CALCIUM 9.8   Liver Function Tests: No results for input(s): AST, ALT, ALKPHOS, BILITOT, PROT, ALBUMIN in the last 168 hours. No results for input(s): LIPASE, AMYLASE in the last 168 hours. No results for input(s): AMMONIA in the last 168 hours. CBC:  Recent Labs Lab 12/31/14 1215  WBC 10.6*  NEUTROABS 6.4  HGB 6.6*  HCT 20.4*  MCV 90.3  PLT 353   Cardiac Enzymes: No results for input(s): CKTOTAL, CKMB, CKMBINDEX, TROPONINI in the last 168 hours.  BNP (last 3  results)  Recent Labs  12/13/14 1620  BNP 56.9    ProBNP (last 3 results) No results for input(s): PROBNP in the last 8760 hours.  CBG: No results for input(s): GLUCAP in the last 168 hours.  Radiological Exams on Admission: No results found.  EKG: Independently reviewed. None performed  Assessment/Plan Principal Problem:   GI bleed due to NSAIDs -Patient on anticoagulation as well as antiplatelets agent -BUN/creatinine ratio suggestive of prerenal azotemia/upper GI bleed -Appears to be mild, can be managed on MedSurg unit at this point but she will need to be transfused -Recommend every 12 checks of hemoglobin after unit given in the emergency room -If gross and alarming symptoms  such as nausea, vomiting, melanoma and hypotension [currently hypertensive and not in distress] we will consult GI otherwise just observe for now  Active Problems:   Multiple sclerosis diagnosed 2002-not on Therapy any longer -Will need outpatient follow-up with neurologist Dr. Krista Blue regarding further therapies -Baseline functionality seems to be bedbound and moving around in a motorized wheelchair    Anemia -Likely secondary to upper GI bleed from NSAIDs and anticoagulation -Transfusing as above   High blood pressure -at home is usually on lisinopril 2.5 daily, Lasix 40 mg 3 times a day edema -Both have been held given mild rise in creatinine  Acute kidney injury -Baseline creatinine and BUN are elevated See above discussion regarding agents  Diabetes mellitus type 2- -Hold metformin 500 twice a day -Sliding scale insulin coverage every 4 hourly given patient is on clears for now -See above discussion    DVT, lower extremity -Continue Rivroxaban -We'll need at least 6-18 months treatment -If further bleeding we will need to reconsider its use but in the setting of acute GI bleed would recommend continuation of agent -Some consideration should be placed for IVC filter if severe  bleeding   Time spent: [place], [facility] Pager [phone]  If 7PM-7AM, please contact night-coverage [email or web address] Password [facility] 12/31/2014, 2:24 PM

## 2014-12-31 NOTE — ED Notes (Signed)
Per ems pt from [facility] living and rehab. Pt sent for medical eval due to low hgb tested on 7/19 to be found 6.2. HX MS , pt denies GI bleed. Pt denies shortness of breath or increase in weakness. Pt is alert and oriented. Pt is non ambulatory due to MS.

## 2014-12-31 NOTE — ED Notes (Signed)
14:39 pt can go to floor.Marland Kitchenklj

## 2014-12-31 NOTE — ED Notes (Signed)
Bed: WA08 Expected date:  Expected time:  Means of arrival:  Comments: EMS low hgb

## 2014-12-31 NOTE — ED Provider Notes (Signed)
CSN: [id number]     Arrival date & time 12/31/14  1125 History   First MD Initiated Contact with Patient 12/31/14 1207     Chief Complaint  Patient presents with  . low blood counts      (Consider location/radiation/quality/duration/timing/severity/associated sxs/prior Treatment) HPI   54 year old female sent to the ER for evaluation of anemia. It appears that this was recognized on follow-up blood work. Reported hemoglobin of 6.2. Patient with hospitalization earlier this month with septic shock. She had an incidentally noted DVT during hospitalization. She was discharged on Xarelto. She has little in terms of acute complaints. Denies any dizziness, lightheadedness or shortness of breath. She is bedbound with hx of MS. No overt bleeding anywhere. No melanoma.  Past Medical History  Diagnosis Date  . MS (multiple sclerosis)   . Hyperglycemia   . Edema   . High blood pressure   . Diabetes mellitus without complication   . Pyelonephritis 12/16/2014   Past Surgical History  Procedure Laterality Date  . Hernia mesh removal    . Tubes tided    . Tubal ligation     Family History  Problem Relation Age of Onset  . Diabetes Mother   . Diabetes Father   . Diabetes Sister   . Scoliosis Sister   . Alzheimer's disease Mother    History  Substance Use Topics  . Smoking status: Former Smoker -- 1.00 packs/day for 20 years    Types: Cigarettes    Quit date: 11/19/2014  . Smokeless tobacco: Never Used  . Alcohol Use: 3.0 oz/week    5 Glasses of wine per week   OB History    No data available     Review of Systems  All systems reviewed and negative, other than as noted in HPI.   Allergies  Review of patient's allergies indicates no known allergies.  Home Medications   Prior to Admission medications   Medication Sig Start Date End Date Taking? Authorizing Provider  albuterol (PROVENTIL HFA;VENTOLIN HFA) 108 (90 BASE) MCG/ACT inhaler Inhale 2 puffs into the lungs every 4  (four) hours as needed for wheezing or shortness of breath. Patient taking differently: Inhale 2 puffs into the lungs 2 (two) times daily as needed for wheezing or shortness of breath. PROAIR 06/29/12  Yes [name], MD  aspirin EC 81 MG tablet Take 81 mg by mouth daily.    Yes Historical Provider, MD  baclofen (LIORESAL) 10 MG tablet Take 10 mg by mouth 2 (two) times daily.    Yes Historical Provider, MD  buPROPion (WELLBUTRIN SR) 150 MG 12 hr tablet Take 150 mg by mouth 2 (two) times daily. 11/21/14  Yes Historical Provider, MD  feeding supplement, ENSURE ENLIVE, (ENSURE ENLIVE) LIQD Take 237 mLs by mouth 2 (two) times daily between meals. 12/21/14  Yes Florencia Reasons, MD  ferrous sulfate 325 (65 FE) MG tablet Take 1 tablet (325 mg total) by mouth 2 (two) times daily. 06/29/12  Yes [name], MD  Lactobacillus (ACIDOPHILUS PO) Take 1 tablet by mouth 3 (three) times daily.   Yes Historical Provider, MD  lisinopril (PRINIVIL,ZESTRIL) 2.5 MG tablet Take 2.5 mg by mouth daily. 12/05/14  Yes Historical Provider, MD  lovastatin (MEVACOR) 20 MG tablet Take 20 mg by mouth daily.   Yes Historical Provider, MD  metFORMIN (GLUCOPHAGE) 500 MG tablet Take 500 mg by mouth 2 (two) times daily. 12/08/14  Yes Historical Provider, MD  potassium chloride SA (K-DUR,KLOR-CON) 20 MEQ tablet Take 1 tablet (  20 mEq total) by mouth daily. 12/21/14  Yes Florencia Reasons, MD  Rivaroxaban (XARELTO) 15 MG TABS tablet Take 15 mg by mouth 2 (two) times daily with a meal. For 21 days 12/25/14 01/15/15 Yes Historical Provider, MD  solifenacin (VESICARE) 5 MG tablet Take 5 mg by mouth daily.   Yes Historical Provider, MD  tiZANidine (ZANAFLEX) 4 MG tablet Take 4 mg by mouth 2 (two) times daily.    Yes Historical Provider, MD  furosemide (LASIX) 40 MG tablet Take 1 tablet (40 mg total) by mouth every three (3) days as needed for edema. Patient not taking: Reported on 12/31/2014 12/21/14   Florencia Reasons, MD  Rivaroxaban (XARELTO STARTER PACK) 15 & 20 MG TBPK  Take as directed on package: Start with one 15mg  tablet by mouth twice a day with food. On Day 22, switch to one 20mg  tablet once a day with food. Patient not taking: Reported on 12/31/2014 12/21/14   Florencia Reasons, MD   BP 151/69 mmHg  Pulse 80  Temp(Src) 98.5 F (36.9 C) (Oral)  Resp 18  SpO2 100% Physical Exam  Constitutional: She appears well-developed and well-nourished. No distress.  HENT:  Head: Normocephalic and atraumatic.  Eyes: Conjunctivae are normal. Right eye exhibits no discharge. Left eye exhibits no discharge.  Neck: Neck supple.  Cardiovascular: Normal rate, regular rhythm and normal heart sounds.  Exam reveals no gallop and no friction rub.   No murmur heard. Pulmonary/Chest: Effort normal and breath sounds normal. No respiratory distress.  Abdominal: Soft. She exhibits no distension. There is no tenderness.  Musculoskeletal: She exhibits no edema or tenderness.  Neurological: She is alert.  Skin: Skin is warm and dry.  Psychiatric: She has a normal mood and affect. Her behavior is normal. Thought content normal.  Nursing note and vitals reviewed.   ED Course  Procedures (including critical care time)  CRITICAL CARE Performed by: Virgel Manifold Total critical care time: 35 minutes Critical care time was exclusive of separately billable procedures and treating other patients. Critical care was necessary to treat or prevent imminent or life-threatening deterioration. Critical care was time spent personally by me on the following activities: development of treatment plan with patient and/or surrogate as well as nursing, discussions with consultants, evaluation of patient's response to treatment, examination of patient, obtaining history from patient or surrogate, ordering and performing treatments and interventions, ordering and review of laboratory studies, ordering and review of radiographic studies, pulse oximetry and re-evaluation of patient's condition.  Labs  Review Labs Reviewed  CBC WITH DIFFERENTIAL/PLATELET - Abnormal; Notable for the following:    WBC 10.6 (*)    RBC 2.26 (*)    Hemoglobin 6.6 (*)    HCT 20.4 (*)    RDW 17.7 (*)    All other components within normal limits  BASIC METABOLIC PANEL - Abnormal; Notable for the following:    Sodium 146 (*)    Chloride 115 (*)    BUN 22 (*)    All other components within normal limits  OCCULT BLOOD X 1 CARD TO LAB, STOOL  VITAMIN B12  FOLATE  IRON AND TIBC  FERRITIN  RETICULOCYTES  TYPE AND SCREEN  PREPARE RBC (CROSSMATCH)  ABO/RH    Imaging Review No results found.   EKG Interpretation None      MDM   Final diagnoses:  Anemia, unspecified anemia type    54 year old female with worsening anemia. She has little in terms of acute complaints, but will transfuse and admit  particularly with being on xarelto for recently diagnosed DVT.    Virgel Manifold, MD 01/02/15 [phone]

## 2015-01-01 DIAGNOSIS — T39395A Adverse effect of other nonsteroidal anti-inflammatory drugs [NSAID], initial encounter: Secondary | ICD-10-CM | POA: Diagnosis not present

## 2015-01-01 DIAGNOSIS — K922 Gastrointestinal hemorrhage, unspecified: Secondary | ICD-10-CM | POA: Diagnosis not present

## 2015-01-01 LAB — TYPE AND SCREEN
ABO/RH(D): A POS
Antibody Screen: NEGATIVE
Unit division: 0

## 2015-01-01 LAB — COMPREHENSIVE METABOLIC PANEL
ALBUMIN: 2.7 g/dL — AB (ref 3.5–5.0)
ALK PHOS: 47 U/L (ref 38–126)
ALT: 14 U/L (ref 14–54)
AST: 12 U/L — ABNORMAL LOW (ref 15–41)
Anion gap: 6 (ref 5–15)
BUN: 24 mg/dL — ABNORMAL HIGH (ref 6–20)
CALCIUM: 9.4 mg/dL (ref 8.9–10.3)
CHLORIDE: 114 mmol/L — AB (ref 101–111)
CO2: 24 mmol/L (ref 22–32)
Creatinine, Ser: 0.82 mg/dL (ref 0.44–1.00)
Glucose, Bld: 77 mg/dL (ref 65–99)
POTASSIUM: 3.6 mmol/L (ref 3.5–5.1)
Sodium: 144 mmol/L (ref 135–145)
Total Bilirubin: 1.7 mg/dL — ABNORMAL HIGH (ref 0.3–1.2)
Total Protein: 6.4 g/dL — ABNORMAL LOW (ref 6.5–8.1)

## 2015-01-01 LAB — GLUCOSE, CAPILLARY
GLUCOSE-CAPILLARY: 92 mg/dL (ref 65–99)
GLUCOSE-CAPILLARY: 127 mg/dL — AB (ref 65–99)
Glucose-Capillary: 111 mg/dL — ABNORMAL HIGH (ref 65–99)
Glucose-Capillary: 77 mg/dL (ref 65–99)
Glucose-Capillary: 78 mg/dL (ref 65–99)
Glucose-Capillary: 78 mg/dL (ref 65–99)

## 2015-01-01 LAB — CBC
HCT: 23.7 % — ABNORMAL LOW (ref 36.0–46.0)
Hemoglobin: 7.7 g/dL — ABNORMAL LOW (ref 12.0–15.0)
MCH: 29.1 pg (ref 26.0–34.0)
MCHC: 32.5 g/dL (ref 30.0–36.0)
MCV: 89.4 fL (ref 78.0–100.0)
Platelets: 299 10*3/uL (ref 150–400)
RBC: 2.65 MIL/uL — AB (ref 3.87–5.11)
RDW: 16.4 % — ABNORMAL HIGH (ref 11.5–15.5)
WBC: 13.6 10*3/uL — AB (ref 4.0–10.5)

## 2015-01-01 LAB — PROTIME-INR
INR: 2.35 — AB (ref 0.00–1.49)
Prothrombin Time: 25.5 seconds — ABNORMAL HIGH (ref 11.6–15.2)

## 2015-01-01 MED ORDER — SORBITOL 70 % SOLN
960.0000 mL | TOPICAL_OIL | Freq: Once | ORAL | Status: DC
  Filled 2015-01-01: qty 240

## 2015-01-01 MED ORDER — SORBITOL 70 % SOLN
30.0000 mL | Freq: Every day | Status: DC | PRN
  Administered 2015-01-01: 30 mL via ORAL
  Filled 2015-01-01: qty 30

## 2015-01-01 MED ORDER — SODIUM CHLORIDE 0.9 % IV SOLN
INTRAVENOUS | Status: DC
  Administered 2015-01-01 – 2015-01-02 (×2): via INTRAVENOUS

## 2015-01-01 NOTE — Consult Note (Signed)
[facility] Consultation Note  Referring Provider: Dr. Verneita Roach [facility]) Primary Care Physician:  Patricia Low, MD Primary Gastroenterologist:  Dr. Earle Roach  Reason for Consultation:  anemia  HPI: [name] is a 54 y.o. female admitted from rehabilitation facility for anemia.  Patient has intermittent history of anemia, Hgb in 8-10 range, for the past two years.  She was recently discharged from hospital after sepsis syndrome and DVT, started on Xarelto.  At rehabilitation facility, was found to have Hgb 6.2, and was admitted for management.  Patient has no abdominal pain, nausea, vomiting, dysphagia, change in bowel habits, melena, hematochezia, hematemesis.  No NSAIDs.  No prior GI bleeding.  No prior endoscopy.  Had screening colonoscopy by Dr. Earle Roach in July 2013 which was normal.   Past Medical History  Diagnosis Date  . MS (multiple sclerosis)   . Hyperglycemia   . Edema   . High blood pressure   . Diabetes mellitus without complication   . Pyelonephritis 12/16/2014    Past Surgical History  Procedure Laterality Date  . Hernia mesh removal    . Tubes tided    . Tubal ligation      Prior to Admission medications   Medication Sig Start Date End Date Taking? Authorizing Provider  albuterol (PROVENTIL HFA;VENTOLIN HFA) 108 (90 BASE) MCG/ACT inhaler Inhale 2 puffs into the lungs every 4 (four) hours as needed for wheezing or shortness of breath. Patient taking differently: Inhale 2 puffs into the lungs 2 (two) times daily as needed for wheezing or shortness of breath. PROAIR 06/29/12  Yes Patricia Low, MD  aspirin EC 81 MG tablet Take 81 mg by mouth daily.    Yes Historical Provider, MD  baclofen (LIORESAL) 10 MG tablet Take 10 mg by mouth 2 (two) times daily.    Yes Historical Provider, MD  buPROPion (WELLBUTRIN SR) 150 MG 12 hr tablet Take 150 mg by mouth 2 (two) times daily. 11/21/14  Yes Historical Provider, MD  feeding supplement, ENSURE ENLIVE,  (ENSURE ENLIVE) LIQD Take 237 mLs by mouth 2 (two) times daily between meals. 12/21/14  Yes Patricia Reasons, MD  ferrous sulfate 325 (65 FE) MG tablet Take 1 tablet (325 mg total) by mouth 2 (two) times daily. 06/29/12  Yes Patricia Low, MD  Lactobacillus (ACIDOPHILUS PO) Take 1 tablet by mouth 3 (three) times daily.   Yes Historical Provider, MD  lisinopril (PRINIVIL,ZESTRIL) 2.5 MG tablet Take 2.5 mg by mouth daily. 12/05/14  Yes Historical Provider, MD  lovastatin (MEVACOR) 20 MG tablet Take 20 mg by mouth daily.   Yes Historical Provider, MD  metFORMIN (GLUCOPHAGE) 500 MG tablet Take 500 mg by mouth 2 (two) times daily. 12/08/14  Yes Historical Provider, MD  potassium chloride SA (K-DUR,KLOR-CON) 20 MEQ tablet Take 1 tablet (20 mEq total) by mouth daily. 12/21/14  Yes Patricia Reasons, MD  Rivaroxaban (XARELTO) 15 MG TABS tablet Take 15 mg by mouth 2 (two) times daily with a meal. For 21 days 12/25/14 01/15/15 Yes Historical Provider, MD  solifenacin (VESICARE) 5 MG tablet Take 5 mg by mouth daily.   Yes Historical Provider, MD  tiZANidine (ZANAFLEX) 4 MG tablet Take 4 mg by mouth 2 (two) times daily.    Yes Historical Provider, MD  furosemide (LASIX) 40 MG tablet Take 1 tablet (40 mg total) by mouth every three (3) days as needed for edema. Patient not taking: Reported on 12/31/2014 12/21/14   Patricia Reasons, MD  Rivaroxaban [name] STARTER PACK) 15 &  20 MG TBPK Take as directed on package: Start with one 15mg  tablet by mouth twice a day with food. On Day 22, switch to one 20mg  tablet once a day with food. Patient not taking: Reported on 12/31/2014 12/21/14   Patricia Reasons, MD    Current Facility-Administered Medications  Medication Dose Route Frequency Provider Last Rate Last Dose  . darifenacin (ENABLEX) 24 hr tablet 7.5 mg  7.5 mg Oral Daily Patricia Sells, MD   7.5 mg at 01/01/15 1122  . insulin aspart (novoLOG) injection 0-9 Units  0-9 Units Subcutaneous Q4H Patricia Barefoot, NP   1 Units at 12/31/14 2040  .  Rivaroxaban (XARELTO) tablet 15 mg  15 mg Oral BID WC Patricia Sells, MD   15 mg at 01/01/15 0739  . [START ON 01/08/2015] rivaroxaban (XARELTO) tablet 20 mg  20 mg Oral Q supper Patricia Sells, MD      . tiZANidine (ZANAFLEX) tablet 4 mg  4 mg Oral BID Patricia Sells, MD   4 mg at 01/01/15 1122    Allergies as of 12/31/2014  . (No Known Allergies)    Family History  Problem Relation Age of Onset  . Diabetes Mother   . Diabetes Father   . Diabetes Sister   . Scoliosis Sister   . Alzheimer's disease Mother     History   Social History  . Marital Status: Single    Spouse Name: N/A  . Number of Children: N/A  . Years of Education: N/A   Occupational History  . Not on file.   Social History Main Topics  . Smoking status: Former Smoker -- 1.00 packs/day for 20 years    Types: Cigarettes    Quit date: 11/19/2014  . Smokeless tobacco: Never Used  . Alcohol Use: 3.0 oz/week    5 Glasses of wine per week  . Drug Use: No  . Sexual Activity: Not Currently   Other Topics Concern  . 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             Review of Systems: As per HPI, all others negative  Physical Exam: Vital signs in last 24 hours: Temp:  [97.5 F (36.4 C)-98.7 F (37.1 C)] 98.1 F (36.7 C) (07/21 0550) Pulse Rate:  [69-82] 71 (07/21 0550) Resp:  [12-18] 14 (07/21 0550) BP: (139-153)/(65-78) 147/70 mmHg (07/21 0550) SpO2:  [98 %-100 %] 100 % (07/21 0550) Weight:  [67.132 kg (148 lb)] 67.132 kg (148 lb) (07/20 1500) Last BM Date: 12/30/14 General:   Alert,  Somewhat deconditioned and frail-appearing, pleasant and cooperative in NAD Head:  Normocephalic and atraumatic. Eyes:  Sclera clear, no icterus.   Conjunctiva pink. Ears:  Normal auditory acuity. Nose:  No deformity, discharge,  or lesions. Mouth:  No deformity or lesions.   Oropharynx pink & moist. Neck:  Supple; no masses or thyromegaly. Lungs:  Clear throughout to auscultation.   No wheezes, crackles, or rhonchi. No acute distress. Heart:  Regular rate and rhythm; no murmurs, clicks, rubs,  or gallops. Abdomen:  Soft, nontender and nondistended. In diaper.  No masses, hepatosplenomegaly or hernias noted. Normal bowel sounds, without guarding, and without rebound.     Msk:  Symmetrical without gross deformities. Normal posture. Pulses:  Normal pulses noted. Extremities:  Without clubbing or edema. Neurologic:  Alert and  oriented x4;  Diffuse weakness from multiple sclerosisgrossly normal neurologically. Psych:  Alert and cooperative. Normal mood and affect.   Lab Results:  Recent Labs  12/31/14 1215 01/01/15 0356  WBC 10.6* 13.6*  HGB 6.6* 7.7*  HCT 20.4* 23.7*  PLT 353 299   BMET  Recent Labs  12/31/14 1215 01/01/15 0356  NA 146* 144  K 3.9 3.6  CL 115* 114*  CO2 24 24  GLUCOSE 98 77  BUN 22* 24*  CREATININE 0.89 0.82  CALCIUM 9.8 9.4   LFT  Recent Labs  01/01/15 0356  PROT 6.4*  ALBUMIN 2.7*  AST 12*  ALT 14  ALKPHOS 47  BILITOT 1.7*   PT/INR  Recent Labs  01/01/15 0356  LABPROT 25.5*  INR 2.35*    Studies/Results: No results found.  Impression:  1.  Anemia without overt bleeding.   Patient appears to have been variably anemic in 8-10 range, since 2014, though her level 6.2 upon admission is certainly less than her baseline.  Recent initiation of anticoagulation (Xarelto) for lower extremity DVT.  No prior endoscopy.  She reports having a colonoscopy about 2 years ago. 2.  Recent LE DVT, on Xarelto, being held. 3.  Multiple sclerosis with functional limitations.  Plan:  1.  PPI. 2.  Hold Xarelto. 3.  Endoscopy tomorrow if INR < 1.8. 4.  Clear liquids OK, NPO after midnight. 5.  If endoscopy is unrevealing, and anemia persists, could consider capsule endoscopy since patient would likely need to return to  anticoagulants (that or have an IVC filter placed) for her recent DVT. 6.  Will follow.   LOS: 1 day   Reianna Batdorf Jerilynn Mages  01/01/2015, 2:27 PM  Pager [phone] If no answer or after 5 PM call [phone]

## 2015-01-01 NOTE — Progress Notes (Signed)
[facility] PROGRESS NOTE  CYD HOSTLER NID:[id number] DOB: 06-02-1961 DOA: 12/31/2014 PCP: [name], MD  HPI/Subjective: 54- year-old female with Multiple sclerosis since 1995-diagnosed formally 2002-previously on Betaseron-WC bound from 2008 followed by Dr. Zenovia [facility] 09/2012 indicating patient did not like Betaseron and that she might not benefit from modifying therapy anymore, Type 2 diabetes mellitus, Mild anemia, prior episodes pyelonephritis, incisional hernia from a prior fibroidectomy s/p repair, prior PNA 06/2012.   She was recently admitted by me 12/13/14 for severe sepsis and coinicdentally found to have LE DVT [L Ext Iliac, CFA] and placed on Xarelto for this indication on discharge from the hospital. It was also noted that on discharge, the patient was kept on aspirin 81 mg in addition to Xarelto.  Patient had routine labs drawn at facility 12/30/14 and was noted to have hemoglobin 6.2 and was admitted for a transfusion/evauation for a GI bleed. Patient herself states that she has had regular stools, no nausea, no vomiting, no chest pain, no stomach pain, and that she has a good diet. She is not dizzy on sitting up, although she does move around at baseline and has been like this since 2008. Labs upon admission confirmed a Hbg of 6.6, WBC 10.6, Plt 353. She reports a past colonoscopy, but does not remember the results.   Today,  she reports feeling unchanged from yesterday, as she did not have any complaints yesterday. She does report hunger. She does not have any nausea, vomiting, diarrhea, abdominal pain, chest pain, heart palpitations, shortness of breath or dysuria. She is having some urinary frequency and is incontinent, which is her baseline.    Assessment/Plan:  Multiple sclerosis diagnosed 2002-not on therapy any longer - Baseline functionality seems to be bedbound and moving around in a motorized wheelchair, she is incontinent at baseline -  Patient is currently at her baseline - Will need outpatient follow-up with neurologist Dr. Krista Blue regarding further therapies - Foley catheter placed due to urinary frequency and incontinence  Normocytic Anemia secondary to GI bleed - Likely secondary to upper GI bleed from NSAIDs and anticoagulation, + heme occult stool - Received 1 unit of PRBC 12/31/14 - Hgb 7.7; Hct 23.7 - Consulted GI for upper GI bleed, EGD scheduled for 01/02/15 if INR is within normal range below 2  - Continue to monitor labs, transfuse if needed  Renovascular hypertension Home meds lisinopril 2.5 daily, Lasix 40 mg 3 times a day for edema -Both have been held given mild rise in creatinine -Consider no further ACE inhibitor and add amlodipine  Acute kidney injury - Baseline creatinine and BUN are elevated - BUN 24 (baseline 7-12 over the past month) and Cr 0.84 (baseline 0.56-0.67 over the past month) - Start IV saline 50 cc/hr 7/21   Diabetes mellitus type 2 - Recently diagnosed 1 week ago - Hold metformin 500 twice a day -Sliding scale insulin coverage every 4 hourly given patient - Clear diet for now - sugars 98-122  DVT, lower extremity -Continue Rivroxaban, will need 6-18  Months treatment -If further bleeding we will need to reconsider its use, but in the setting of acute recent DVT would recommend continuation of agent -Some consideration should be placed for IVC filter if severe bleeding   Asymptomatic hematuria -Probably from Foley trauma -Monitor hemoglobin  Code Status: Full Family Communication: Patient Disposition Plan: Remain inpatient  Consultants:  GI  Procedures:  EGD scheduled for 01/02/15  Objective: Filed Vitals:   01/01/15 0550  BP: 147/70  Pulse: 71  Temp: 98.1 F (36.7 C)  Resp: 14    Intake/Output Summary (Last 24 hours) at 01/01/15 1318 Last data filed at 01/01/15 0551  Gross per 24 hour  Intake    770 ml  Output      0 ml  Net    770 ml   Filed Weights    12/31/14 1500  Weight: 67.132 kg (148 lb)    Exam:   General:  Patient is seated in bed in no distress, she is pleasant and communicates appropriately  Cardiovascular: RRR, no m/r/g, no LE edema, 2+ DP pulses  Respiratory: Clear to ausculation, normal respiratory effort  Abdomen: Bowel sounds present, some diffuse firmness noted throughout abdomen that is likely stool as patient has not had a bowel movement for at least a few days, no tenderness to palpation, no guarding  Musculoskeletal: Hyperkeratinization of skin on both feet, healing wound anterior left shin, some atrophy of lower extremities, grossly normal tone BUE   Neurologic: Patellar reflex intact bilaterally, 3/5 strength in right arm, 5/5 strength in left arm, cranial nerves II - XII intact. Sensation intact BLE/BUE.    Skin: Irritant contact dermatitis in groin and anal area due to frequent incontinence with minimal skin breakdown, no ulcers  Data Reviewed: Basic Metabolic Panel:  Recent Labs Lab 12/31/14 1215 01/01/15 0356  NA 146* 144  K 3.9 3.6  CL 115* 114*  CO2 24 24  GLUCOSE 98 77  BUN 22* 24*  CREATININE 0.89 0.82  CALCIUM 9.8 9.4   Liver Function Tests:  Recent Labs Lab 01/01/15 0356  AST 12*  ALT 14  ALKPHOS 47  BILITOT 1.7*  PROT 6.4*  ALBUMIN 2.7*   CBC:  Recent Labs Lab 12/31/14 1215 01/01/15 0356  WBC 10.6* 13.6*  NEUTROABS 6.4  --   HGB 6.6* 7.7*  HCT 20.4* 23.7*  MCV 90.3 89.4  PLT 353 299   BNP (last 3 results)  Recent Labs  12/13/14 1620  BNP 56.9    CBG:  Recent Labs Lab 12/31/14 2019 01/01/15 0007 01/01/15 0429 01/01/15 0738 01/01/15 1143  GLUCAP 122* 78 78 77 111*    Studies: No results found.  Scheduled Meds: . darifenacin  7.5 mg Oral Daily  . insulin aspart  0-9 Units Subcutaneous Q4H  . Rivaroxaban  15 mg Oral BID WC  . [START ON 01/08/2015] rivaroxaban  20 mg Oral Q supper  . tiZANidine  4 mg Oral BID   Principal Problem:   GI bleed due  to NSAIDs Active Problems:   Multiple sclerosis diagnosed 2002-not on Therapy any longer   Anemia   MS (multiple sclerosis)   High blood pressure   Pressure ulcer   DVT, lower extremity   Time spent: [place], PA-S Verneita Griffes, MD [facility] Pager [phone]. If 7PM-7AM, please contact night-coverage at [email or web address], password [facility] 01/01/2015, 1:18 PM  LOS: 1 day     I agree with the History/assesment & plan per Midlevel provider as per above, and independently assessed and discussed the plan of care with the patient, and ammendments were made to above note reflecting my thoughts after careful review of Database, Progress notes, Imaging and Consultant notes Verneita Griffes, MD [facility] (P) [phone]

## 2015-01-01 NOTE — Clinical Social Work Note (Signed)
Clinical Social Work Assessment  Patient Details  Name: [name] MRN: [id number] Date of Birth: November 27, 1960  Date of referral:  01/01/15               Reason for consult:  Discharge Planning                Permission sought to share information with:    Permission granted to share information::  No  Name::        Agency::     Relationship::     Contact Information:     Housing/Transportation Living arrangements for the past 2 months:  [place] of Information:  Patient Patient Interpreter Needed:  None Criminal Activity/Legal Involvement Pertinent to Current Situation/Hospitalization:  No - Comment as needed Significant Relationships:  Significant Other, Other Family Members Lives with:  Facility Resident Do you feel safe going back to the place where you live?  Yes Need for family participation in patient care:  No (Coment)  Care giving concerns:  Pt admitted from [place]. No care giving concerns identified at this time.   Social Worker assessment / plan:  CSW received referral that pt admitted from [place].  CSW met with pt at bedside. CSW introduced self and explained role. Pt confirmed that she is a resident at [facility]. Pt shared that she recently went to facility from [facility] for rehab. Pt confirmed that her plan is to return to [place] upon discharge.   CSW completed FL2 and sent clinicals to [facility]. CSW spoke to [place] and confirmed that facility could accept pt back upon discharge.  CSW to continue to follow to provide support and assist with pt return to [facility] and Rehab.  Employment status:  Disabled (Comment on whether or not currently receiving Disability) Insurance information:    PT Recommendations:  Not assessed at this time Information / Referral to community resources:  [place]  Patient/Family's Response to care:  Pt alert and oriented x 4. Pt is hopeful that she will not have to be at [facility] much longer following return to facility after hospitalization. Pt agrees to return to [facility] and Rehab  Patient/Family's Understanding of and Emotional Response to Diagnosis, Current Treatment, and Prognosis:  Pt did not discuss diagnosis and understanding of treatment plan. Pt answered questions directly surrounding return to facility.   Emotional Assessment Appearance:    Attitude/Demeanor/Rapport:  Other (pt appropriate) Affect (typically observed):  Accepting Orientation:  Oriented to Self, Oriented to Place, Oriented to  Time, Oriented to Situation Alcohol / Substance use:  Not Applicable Psych involvement (Current and /or in the community):  No (Comment)  Discharge Needs  Concerns to be addressed:  Discharge Planning Concerns Readmission within the last 30 days:  Yes Current discharge risk:  None Barriers to Discharge:  No Barriers Identified   [place], [place], LCSW 01/01/2015, 4:30 PM  [phone]

## 2015-01-01 NOTE — Progress Notes (Signed)
Condition Code 44 issued to pt. Marney Doctor RN,BSN,NCM

## 2015-01-01 NOTE — Plan of Care (Signed)
Problem: Phase I Progression Outcomes Goal: OOB as tolerated unless otherwise ordered Outcome: Not Progressing Pt with MS that has caused her to become bedbound

## 2015-01-02 ENCOUNTER — Encounter (HOSPITAL_COMMUNITY)
Admission: EM | Disposition: A | Discharge status: Skilled Nursing Facility | Payer: Self-pay | Source: Home / Self Care | Attending: Emergency Medicine

## 2015-01-02 ENCOUNTER — Encounter (HOSPITAL_COMMUNITY): Payer: Self-pay | Admitting: *Deleted

## 2015-01-02 DIAGNOSIS — T39395A Adverse effect of other nonsteroidal anti-inflammatory drugs [NSAID], initial encounter: Secondary | ICD-10-CM | POA: Diagnosis not present

## 2015-01-02 DIAGNOSIS — K922 Gastrointestinal hemorrhage, unspecified: Secondary | ICD-10-CM | POA: Diagnosis not present

## 2015-01-02 HISTORY — PX: ESOPHAGOGASTRODUODENOSCOPY: SHX5428

## 2015-01-02 LAB — CBC WITH DIFFERENTIAL/PLATELET
Basophils Absolute: 0.1 10*3/uL (ref 0.0–0.1)
Basophils Relative: 0 % (ref 0–1)
Eosinophils Absolute: 0.4 10*3/uL (ref 0.0–0.7)
Eosinophils Relative: 3 % (ref 0–5)
HEMATOCRIT: 25.7 % — AB (ref 36.0–46.0)
HEMOGLOBIN: 8.4 g/dL — AB (ref 12.0–15.0)
Lymphocytes Relative: 34 % (ref 12–46)
Lymphs Abs: 4.1 10*3/uL — ABNORMAL HIGH (ref 0.7–4.0)
MCH: 29.7 pg (ref 26.0–34.0)
MCHC: 32.7 g/dL (ref 30.0–36.0)
MCV: 90.8 fL (ref 78.0–100.0)
Monocytes Absolute: 0.8 10*3/uL (ref 0.1–1.0)
Monocytes Relative: 7 % (ref 3–12)
NEUTROS ABS: 6.9 10*3/uL (ref 1.7–7.7)
NEUTROS PCT: 56 % (ref 43–77)
PLATELETS: 313 10*3/uL (ref 150–400)
RBC: 2.83 MIL/uL — ABNORMAL LOW (ref 3.87–5.11)
RDW: 16.9 % — AB (ref 11.5–15.5)
WBC: 12.2 10*3/uL — ABNORMAL HIGH (ref 4.0–10.5)

## 2015-01-02 LAB — GLUCOSE, CAPILLARY
GLUCOSE-CAPILLARY: 85 mg/dL (ref 65–99)
GLUCOSE-CAPILLARY: 80 mg/dL (ref 65–99)
GLUCOSE-CAPILLARY: 84 mg/dL (ref 65–99)
GLUCOSE-CAPILLARY: 84 mg/dL (ref 65–99)
Glucose-Capillary: 80 mg/dL (ref 65–99)
Glucose-Capillary: 137 mg/dL — ABNORMAL HIGH (ref 65–99)
Glucose-Capillary: 116 mg/dL — ABNORMAL HIGH (ref 65–99)

## 2015-01-02 LAB — PROTIME-INR
INR: 1.69 — ABNORMAL HIGH (ref 0.00–1.49)
Prothrombin Time: 19.9 seconds — ABNORMAL HIGH (ref 11.6–15.2)

## 2015-01-02 SURGERY — EGD (ESOPHAGOGASTRODUODENOSCOPY)
Anesthesia: Moderate Sedation | Laterality: Left

## 2015-01-02 MED ORDER — DIPHENHYDRAMINE HCL 50 MG/ML IJ SOLN
INTRAMUSCULAR | Status: AC
  Filled 2015-01-02: qty 1

## 2015-01-02 MED ORDER — MIDAZOLAM HCL 5 MG/ML IJ SOLN
INTRAMUSCULAR | Status: AC
  Filled 2015-01-02: qty 2

## 2015-01-02 MED ORDER — FENTANYL CITRATE (PF) 100 MCG/2ML IJ SOLN
INTRAMUSCULAR | Status: AC
  Filled 2015-01-02: qty 2

## 2015-01-02 MED ORDER — BUTAMBEN-TETRACAINE-BENZOCAINE 2-2-14 % EX AERO
INHALATION_SPRAY | CUTANEOUS | Status: DC | PRN
  Administered 2015-01-02: 2 via TOPICAL

## 2015-01-02 MED ORDER — INSULIN ASPART 100 UNIT/ML ~~LOC~~ SOLN: 0.0000 [IU] | Freq: Three times a day (TID) | SUBCUTANEOUS | Status: DC

## 2015-01-02 MED ORDER — POLYSACCHARIDE IRON COMPLEX 150 MG PO CAPS
150.0000 mg | ORAL_CAPSULE | Freq: Every day | ORAL | Status: DC
  Administered 2015-01-02 – 2015-01-03 (×2): 150 mg via ORAL
  Filled 2015-01-02 (×2): qty 1

## 2015-01-02 MED ORDER — PANTOPRAZOLE SODIUM 40 MG PO TBEC
40.0000 mg | DELAYED_RELEASE_TABLET | Freq: Every day | ORAL | Status: DC
  Administered 2015-01-02 – 2015-01-03 (×2): 40 mg via ORAL
  Filled 2015-01-02: qty 1

## 2015-01-02 MED ORDER — FENTANYL CITRATE (PF) 100 MCG/2ML IJ SOLN
INTRAMUSCULAR | Status: DC | PRN
  Administered 2015-01-02 (×2): 25 ug via INTRAVENOUS

## 2015-01-02 MED ORDER — MIDAZOLAM HCL 10 MG/2ML IJ SOLN
INTRAMUSCULAR | Status: DC | PRN
  Administered 2015-01-02 (×2): 2 mg via INTRAVENOUS

## 2015-01-02 MED ORDER — SODIUM CHLORIDE 0.9 % IV SOLN
INTRAVENOUS | Status: DC
  Administered 2015-01-02: 500 mL via INTRAVENOUS

## 2015-01-02 NOTE — Interval H&P Note (Signed)
History and Physical Interval Note:  01/02/2015 11:47 AM  [name]  has presented today for surgery, with the diagnosis of anemia  The various methods of treatment have been discussed with the patient and family. After consideration of risks, benefits and other options for treatment, the patient has consented to  Procedure(s): ESOPHAGOGASTRODUODENOSCOPY (EGD) (Left) as a surgical intervention .  The patient's history has been reviewed, patient examined, no change in status, stable for surgery.  I have reviewed the patient's chart and labs.  Questions were answered to the patient's satisfaction.     [name] M  Assessment:  1.  Anemia.  Plan:  1.  Endoscopy. 2.  Risks (bleeding, infection, bowel perforation that could require surgery, sedation-related changes in cardiopulmonary systems), benefits (identification and possible treatment of source of symptoms, exclusion of certain causes of symptoms), and alternatives (watchful waiting, radiographic imaging studies, empiric medical treatment) of upper endoscopy (EGD) were explained to patient/family in detail and patient wishes to proceed.

## 2015-01-02 NOTE — Progress Notes (Signed)
CSW continuing to follow.  Pt plans to return to [place] upon discharge.   CSW confirmed with [facility] that pt can return to facility over the weekend.   CSW met with pt at bedside to update. Pt asked for CSW to contact [name] and [place] [facility]. CSW contacted [place] via telephone and left voice message.  Weekend CSW to facilitate pt discharge needs if pt medically ready for discharge.  Alison Murray, MSW, [place] Work [phone]

## 2015-01-02 NOTE — Op Note (Signed)
[facility] [place], [address]   ENDOSCOPY PROCEDURE REPORT  PATIENT: Patricia, Roach  MR#: [id number] BIRTHDATE: May 30, 1961 , 53  yrs. old GENDER: female ENDOSCOPIST: Arta Silence, MD REFERRED BY:  [facility] PROCEDURE DATE:  01-25-2015 PROCEDURE:  EGD, diagnostic ASA CLASS:     Class III INDICATIONS:  anemia, Xarelto. MEDICATIONS: Fentanyl 50 mcg IV and Versed 4 mg IV TOPICAL ANESTHETIC: Cetacaine Spray  DESCRIPTION OF PROCEDURE: After the risks benefits and alternatives of the procedure were thoroughly explained, informed consent was obtained.  The pediatric forward-viewing  endoscope was introduced through the mouth and advanced to the second portion of the duodenum. The instrument was slowly withdrawn as the mucosa was fully examined. Estimated blood loss is zero unless otherwise noted in this procedure report.    Findings:  Normal esophagus.  Normal stomach.  Normal pylorus. Normal duodenum to the second portion. No old or fresh blood was seen to the extent of our examination.  No ulcer, mass, inflammatory changes were seen.  Couple diminutive puntate red marks in stomach and distal duodenum, non-bleeding, highly likely mild scope trauma.              The scope was then withdrawn from the patient and the procedure completed.  COMPLICATIONS: There were no immediate complications.  ENDOSCOPIC IMPRESSION:     As above.  No source of anemia was identified.  RECOMMENDATIONS:     1.  Watch for potential complications of procedure. 2.  PPI + Iron, so long as patient is on Xarelto. 3.  OK to continue Xarelto. 4.  If anemia worsens despite PPI + Iron, in setting of Xarelto use, would consider capsule endoscopy as next step in management. 5.  Full liquid diet, advance as tolerated. 6.  No further GI procedures or interventions are anticipated during this admission.  She had normal colonoscopy in July 2013, don't see need to repeat  that at the present time. 7.  Will sign-off; please call with questions; thank you for the consultation.  eSigned:  Arta Silence, MD 01/25/15 12:17 PM   CC:  CPT CODES: ICD CODES:  The ICD and CPT codes recommended by this software are interpretations from the data that the clinical staff has captured with the software.  The verification of the translation of this report to the ICD and CPT codes and modifiers is the sole responsibility of the health care institution and practicing physician where this report was generated.  [employer]. will not be held responsible for the validity of the ICD and CPT codes included on this report.  AMA assumes no liability for data contained or not contained herein. CPT is a Designer, television/film set of the [employer].

## 2015-01-02 NOTE — Progress Notes (Signed)
[facility] PROGRESS NOTE  [name] HKV:[id number] DOB: Mar 10, 1961 DOA: 12/31/2014 PCP: [name], MD  HPI/Subjective:   54- year-old female with Multiple sclerosis since 1995-diagnosed formally 2002-previously on Betaseron-WC bound from 2008 followed by Dr. Zenovia [facility] 09/2012 indicating patient did not like Betaseron and that she might not benefit from modifying therapy anymore, Type 2 diabetes mellitus, Mild anemia, prior episodes pyelonephritis, incisional hernia from a prior fibroidectomy s/p repair, prior PNA 06/2012.   She was recently admitted by me 12/13/14 for severe sepsis and coinicdentally found to have LE DVT [L Ext Iliac, CFA] and placed on Xarelto for this indication on discharge from the hospital. It was also noted that on discharge, the patient was kept on aspirin 81 mg in addition to Xarelto.  Patient had routine labs drawn at facility 12/30/14 and was noted to have hemoglobin 6.2 and was admitted for a transfusion/evauation for a GI bleed. Patient herself states that she has had regular stools, no nausea, no vomiting, no chest pain, no stomach pain, and that she has a good diet. She is not dizzy on sitting up, although she does move around at baseline and has been like this since 2008. Labs upon admission confirmed a Hbg of 6.6, WBC 10.6, Plt 353. She reports a past colonoscopy, but does not remember the results.    Subjective   unchanged from yesterday with no complaints, though she did not sleep very well last night. She had one bowel movement after drinking Sorbital last night and reports the stool was initially firm, then became more loose, stool was dark with no bright red blood. She does report hunger. She does not have any nausea, vomiting, diarrhea, abdominal pain, chest pain, heart palpitations, or shortness of breath. She is more comfortable since a foley catheter was placed as she no longer wets the bed due to urinary frequency and  incontinence.   Assessment/Plan:  Multiple sclerosis diagnosed 2002-not on therapy any longer - Baseline functionality seems to be bedbound and moving around in a motorized wheelchair, she is incontinent at baseline. - Patient is currently at her baseline. - Will need outpatient follow-up with neurologist Dr. Krista Blue regarding further therapies. - Foley catheter placed due to urinary frequency and incontinence. - clamp foley and attempt to d/c the same.  Normocytic Anemia secondary to GI bleed - Likely secondary to upper GI bleed from NSAIDs and anticoagulation, + heme occult stool - Received 1 unit of PRBC 12/31/14 - Hgb 8.4, Hct 25.7, INR 1.69 01/02/15 - Consulted GI, EGD findings as below. - Normal esophagus, stomach, pylorus, duodenum to the second portion with no old or fresh blood seen. No ulcer, mass, inflammatory changes were seen. Couple diminutive puntate red marks in stomach and distal duodenum, non-bleeding, highly likely mild scope trauma. - PPI + Iron per GI. - Can resume diet, full liquids and advance as tolerated. - Continue to monitor labs, transfuse if needed.  Renovascular hypertension - Home meds lisinopril 2.5 daily, Lasix 40 mg 3 times a day for edema - Both have been held given mild rise in creatinine -Consider no further ACE inhibitor and add amlodipine  Acute kidney injury - Baseline creatinine and BUN are elevated - BUN 24 (baseline 7-12 over the past month) and Cr 0.84 (baseline 0.56-0.67 over the past month) - Start IV saline 50 cc/hr 01/01/15   Diabetes mellitus type 2 - Recently diagnosed 1 week ago - Hold metformin 500 twice a day - Sliding scale insulin coverage every 4  hourly given patient - NPO since midnight due to upcoming EGD, will return to full liquids and advance as tolerated post EGD - CBG 98-122  DVT, lower extremity - Continue Xarelto, will likely need 6-18 mo treatment - If further bleeding we will need to reconsider its use, but in the  setting of acute recent DVT would recommend continuation of agent - Some consideration should be placed for IVC filter if severe bleeding   Asymptomatic hematuria -Probably from Foley trauma -Monitor hemoglobin  Code Status: Full Family Communication: Patient Disposition Plan: Remain inpatient  Consultants:  GI  Procedures:  EGD 01/02/15  Objective: Filed Vitals:   01/02/15 0517  BP: 134/75  Pulse: 88  Temp: 98.2 F (36.8 C)  Resp: 15    Intake/Output Summary (Last 24 hours) at 01/02/15 0915 Last data filed at 01/02/15 0658  Gross per 24 hour  Intake 914.17 ml  Output   4175 ml  Net -3260.83 ml   Filed Weights   12/31/14 1500  Weight: 67.132 kg (148 lb)    Exam:   General:  Patient seated comfortably in bed in no acute distress. She is pleasant and conversational.  ENT: Some conjunctival pallor  Cardiovascular: RRR, no m/r/g, no LE edema, 2+ PT pulses bilaterally  Respiratory: Clear to auscultation, normal respiratory effort  Abdomen: Bowel sounds present, soft with some epigastric/hypogastric firmness that has decreased since yesterday, non-tender, no hepatosplenomegaly  Musculoskeletal: Grossly normal tone, some atrophy of BLE  Neuro: Exam remains unchanged from previously  Data Reviewed: Basic Metabolic Panel:  Recent Labs Lab 12/31/14 1215 01/01/15 0356  NA 146* 144  K 3.9 3.6  CL 115* 114*  CO2 24 24  GLUCOSE 98 77  BUN 22* 24*  CREATININE 0.89 0.82  CALCIUM 9.8 9.4   Liver Function Tests:  Recent Labs Lab 01/01/15 0356  AST 12*  ALT 14  ALKPHOS 47  BILITOT 1.7*  PROT 6.4*  ALBUMIN 2.7*   CBC:  Recent Labs Lab 12/31/14 1215 01/01/15 0356 01/02/15 0735  WBC 10.6* 13.6* 12.2*  NEUTROABS 6.4  --  6.9  HGB 6.6* 7.7* 8.4*  HCT 20.4* 23.7* 25.7*  MCV 90.3 89.4 90.8  PLT 353 299 313   BNP (last 3 results)  Recent Labs  12/13/14 1620  BNP 56.9   CBG:  Recent Labs Lab 01/01/15 1647 01/01/15 2017 01/02/15 0005  01/02/15 0401 01/02/15 0747  GLUCAP 92 127* 80 85 80   Studies: No results found.  Scheduled Meds: . darifenacin  7.5 mg Oral Daily  . insulin aspart  0-9 Units Subcutaneous Q4H  . Rivaroxaban  15 mg Oral BID WC  . [START ON 01/08/2015] rivaroxaban  20 mg Oral Q supper  . sorbitol, milk of mag, mineral oil, glycerin (SMOG) enema  960 mL Rectal Once  . tiZANidine  4 mg Oral BID   Continuous Infusions: . sodium chloride 50 mL/hr at 01/01/15 1841    Principal Problem:   GI bleed due to NSAIDs Active Problems:   Multiple sclerosis diagnosed 2002-not on Therapy any longer   Anemia   MS (multiple sclerosis)   High blood pressure   Pressure ulcer   DVT, lower extremity   Time spent: [place], PA-S Verneita Griffes, MD [facility] Pager [phone]. If 7PM-7AM, please contact night-coverage at [email or web address], password [email or web address] 01/02/2015, 9:15 AM  LOS: 2 days      I agree with the History/assesment & plan per Midlevel provider as per above, and independently assessed  and discussed the plan of care with the patient, and ammendments were made to above note reflecting my thoughts after careful review of Database, Progress notes, Imaging and Consultant notes Verneita Griffes, MD [facility] [phone]

## 2015-01-02 NOTE — H&P (View-Only) (Signed)
[name]  Referring Provider: Dr. Verneita Griffes [facility]) Primary Care Physician:  [name], MD Primary Gastroenterologist:  Dr. Earle Gell  Reason for Consultation:  anemia  HPI: [name] is a 54 y.o. female admitted from rehabilitation facility for anemia.  Patient has intermittent history of anemia, Hgb in 8-10 range, for the past two years.  She was recently discharged from hospital after sepsis syndrome and DVT, started on Xarelto.  At rehabilitation facility, was found to have Hgb 6.2, and was admitted for management.  Patient has no abdominal pain, nausea, vomiting, dysphagia, change in bowel habits, melena, hematochezia, hematemesis.  No NSAIDs.  No prior GI bleeding.  No prior endoscopy.  Had screening colonoscopy by Dr. Earle Gell in July 2013 which was normal.   Past Medical History  Diagnosis Date  . MS (multiple sclerosis)   . Hyperglycemia   . Edema   . High blood pressure   . Diabetes mellitus without complication   . Pyelonephritis 12/16/2014    Past Surgical History  Procedure Laterality Date  . Hernia mesh removal    . Tubes tided    . Tubal ligation      Prior to Admission medications   Medication Sig Start Date End Date Taking? Authorizing Provider  albuterol (PROVENTIL HFA;VENTOLIN HFA) 108 (90 BASE) MCG/ACT inhaler Inhale 2 puffs into the lungs every 4 (four) hours as needed for wheezing or shortness of breath. Patient taking differently: Inhale 2 puffs into the lungs 2 (two) times daily as needed for wheezing or shortness of breath. PROAIR 06/29/12  Yes [name], MD  aspirin EC 81 MG tablet Take 81 mg by mouth daily.    Yes Historical Provider, MD  baclofen (LIORESAL) 10 MG tablet Take 10 mg by mouth 2 (two) times daily.    Yes Historical Provider, MD  buPROPion (WELLBUTRIN SR) 150 MG 12 hr tablet Take 150 mg by mouth 2 (two) times daily. 11/21/14  Yes Historical Provider, MD  feeding supplement, ENSURE ENLIVE,  (ENSURE ENLIVE) LIQD Take 237 mLs by mouth 2 (two) times daily between meals. 12/21/14  Yes Florencia Reasons, MD  ferrous sulfate 325 (65 FE) MG tablet Take 1 tablet (325 mg total) by mouth 2 (two) times daily. 06/29/12  Yes [name], MD  Lactobacillus (ACIDOPHILUS PO) Take 1 tablet by mouth 3 (three) times daily.   Yes Historical Provider, MD  lisinopril (PRINIVIL,ZESTRIL) 2.5 MG tablet Take 2.5 mg by mouth daily. 12/05/14  Yes Historical Provider, MD  lovastatin (MEVACOR) 20 MG tablet Take 20 mg by mouth daily.   Yes Historical Provider, MD  metFORMIN (GLUCOPHAGE) 500 MG tablet Take 500 mg by mouth 2 (two) times daily. 12/08/14  Yes Historical Provider, MD  potassium chloride SA (K-DUR,KLOR-CON) 20 MEQ tablet Take 1 tablet (20 mEq total) by mouth daily. 12/21/14  Yes Florencia Reasons, MD  Rivaroxaban (XARELTO) 15 MG TABS tablet Take 15 mg by mouth 2 (two) times daily with a meal. For 21 days 12/25/14 01/15/15 Yes Historical Provider, MD  solifenacin (VESICARE) 5 MG tablet Take 5 mg by mouth daily.   Yes Historical Provider, MD  tiZANidine (ZANAFLEX) 4 MG tablet Take 4 mg by mouth 2 (two) times daily.    Yes Historical Provider, MD  furosemide (LASIX) 40 MG tablet Take 1 tablet (40 mg total) by mouth every three (3) days as needed for edema. Patient not taking: Reported on 12/31/2014 12/21/14   Florencia Reasons, MD  Rivaroxaban [name] STARTER PACK) 15 &  20 MG TBPK Take as directed on package: Start with one 15mg  tablet by mouth twice a day with food. On Day 22, switch to one 20mg  tablet once a day with food. Patient not taking: Reported on 12/31/2014 12/21/14   Florencia Reasons, MD    Current Facility-Administered Medications  Medication Dose Route Frequency Provider Last Rate Last Dose  . darifenacin (ENABLEX) 24 hr tablet 7.5 mg  7.5 mg Oral Daily Nita Sells, MD   7.5 mg at 01/01/15 1122  . insulin aspart (novoLOG) injection 0-9 Units  0-9 Units Subcutaneous Q4H Gardiner Barefoot, NP   1 Units at 12/31/14 2040  .  Rivaroxaban (XARELTO) tablet 15 mg  15 mg Oral BID WC Nita Sells, MD   15 mg at 01/01/15 0739  . [START ON 01/08/2015] rivaroxaban (XARELTO) tablet 20 mg  20 mg Oral Q supper Nita Sells, MD      . tiZANidine (ZANAFLEX) tablet 4 mg  4 mg Oral BID Nita Sells, MD   4 mg at 01/01/15 1122    Allergies as of 12/31/2014  . (No Known Allergies)    Family History  Problem Relation Age of Onset  . Diabetes Mother   . Diabetes Father   . Diabetes Sister   . Scoliosis Sister   . Alzheimer's disease Mother     History   Social History  . Marital Status: Single    Spouse Name: N/A  . Number of Children: N/A  . Years of Education: N/A   Occupational History  . Not on file.   Social History Main Topics  . Smoking status: Former Smoker -- 1.00 packs/day for 20 years    Types: Cigarettes    Quit date: 11/19/2014  . Smokeless tobacco: Never Used  . Alcohol Use: 3.0 oz/week    5 Glasses of wine per week  . Drug Use: No  . Sexual Activity: Not Currently   Other Topics Concern  . 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             Review of Systems: As per HPI, all others negative  Physical Exam: Vital signs in last 24 hours: Temp:  [97.5 F (36.4 C)-98.7 F (37.1 C)] 98.1 F (36.7 C) (07/21 0550) Pulse Rate:  [69-82] 71 (07/21 0550) Resp:  [12-18] 14 (07/21 0550) BP: (139-153)/(65-78) 147/70 mmHg (07/21 0550) SpO2:  [98 %-100 %] 100 % (07/21 0550) Weight:  [67.132 kg (148 lb)] 67.132 kg (148 lb) (07/20 1500) Last BM Date: 12/30/14 General:   Alert,  Somewhat deconditioned and frail-appearing, pleasant and cooperative in NAD Head:  Normocephalic and atraumatic. Eyes:  Sclera clear, no icterus.   Conjunctiva pink. Ears:  Normal auditory acuity. Nose:  No deformity, discharge,  or lesions. Mouth:  No deformity or lesions.   Oropharynx pink & moist. Neck:  Supple; no masses or thyromegaly. Lungs:  Clear throughout to auscultation.   No wheezes, crackles, or rhonchi. No acute distress. Heart:  Regular rate and rhythm; no murmurs, clicks, rubs,  or gallops. Abdomen:  Soft, nontender and nondistended. In diaper.  No masses, hepatosplenomegaly or hernias noted. Normal bowel sounds, without guarding, and without rebound.     Msk:  Symmetrical without gross deformities. Normal posture. Pulses:  Normal pulses noted. Extremities:  Without clubbing or edema. Neurologic:  Alert and  oriented x4;  Diffuse weakness from multiple sclerosisgrossly normal neurologically. Psych:  Alert and cooperative. Normal mood and affect.   Lab Results:  Recent Labs  12/31/14 1215 01/01/15 0356  WBC 10.6* 13.6*  HGB 6.6* 7.7*  HCT 20.4* 23.7*  PLT 353 299   BMET  Recent Labs  12/31/14 1215 01/01/15 0356  NA 146* 144  K 3.9 3.6  CL 115* 114*  CO2 24 24  GLUCOSE 98 77  BUN 22* 24*  CREATININE 0.89 0.82  CALCIUM 9.8 9.4   LFT  Recent Labs  01/01/15 0356  PROT 6.4*  ALBUMIN 2.7*  AST 12*  ALT 14  ALKPHOS 47  BILITOT 1.7*   PT/INR  Recent Labs  01/01/15 0356  LABPROT 25.5*  INR 2.35*    Studies/Results: No results found.  Impression:  1.  Anemia without overt bleeding.   Patient appears to have been variably anemic in 8-10 range, since 2014, though her level 6.2 upon admission is certainly less than her baseline.  Recent initiation of anticoagulation (Xarelto) for lower extremity DVT.  No prior endoscopy.  She reports having a colonoscopy about 2 years ago. 2.  Recent LE DVT, on Xarelto, being held. 3.  Multiple sclerosis with functional limitations.  Plan:  1.  PPI. 2.  Hold Xarelto. 3.  Endoscopy tomorrow if INR < 1.8. 4.  Clear liquids OK, NPO after midnight. 5.  If endoscopy is unrevealing, and anemia persists, could consider capsule endoscopy since patient would likely need to return to  anticoagulants (that or have an IVC filter placed) for her recent DVT. 6.  Will follow.   LOS: 1 day   Christasia Angeletti Jerilynn Mages  01/01/2015, 2:27 PM  Pager [phone] If no answer or after 5 PM call [phone]

## 2015-01-03 DIAGNOSIS — T39395A Adverse effect of other nonsteroidal anti-inflammatory drugs [NSAID], initial encounter: Secondary | ICD-10-CM | POA: Diagnosis not present

## 2015-01-03 DIAGNOSIS — K922 Gastrointestinal hemorrhage, unspecified: Secondary | ICD-10-CM | POA: Diagnosis not present

## 2015-01-03 LAB — GLUCOSE, CAPILLARY
GLUCOSE-CAPILLARY: 133 mg/dL — AB (ref 65–99)
Glucose-Capillary: 86 mg/dL (ref 65–99)

## 2015-01-03 LAB — CBC
HCT: 23.8 % — ABNORMAL LOW (ref 36.0–46.0)
Hemoglobin: 7.8 g/dL — ABNORMAL LOW (ref 12.0–15.0)
MCH: 30 pg (ref 26.0–34.0)
MCHC: 32.8 g/dL (ref 30.0–36.0)
MCV: 91.5 fL (ref 78.0–100.0)
Platelets: 330 10*3/uL (ref 150–400)
RBC: 2.6 MIL/uL — ABNORMAL LOW (ref 3.87–5.11)
RDW: 17 % — AB (ref 11.5–15.5)
WBC: 11.9 10*3/uL — ABNORMAL HIGH (ref 4.0–10.5)

## 2015-01-03 MED ORDER — PANTOPRAZOLE SODIUM 40 MG PO TBEC: 40.0000 mg | DELAYED_RELEASE_TABLET | Freq: Every day | ORAL | Status: DC

## 2015-01-03 MED ORDER — POLYSACCHARIDE IRON COMPLEX 150 MG PO CAPS: 150.0000 mg | ORAL_CAPSULE | Freq: Every day | ORAL | Status: DC

## 2015-01-03 NOTE — Progress Notes (Signed)
Patient discharged to [facility]. Via [facility] ambulance, discharge packet sent with patient.

## 2015-01-03 NOTE — Discharge Summary (Signed)
Physician Discharge Summary  [name] LDJ:[id number] DOB: Nov 09, 1960 DOA: 12/31/2014  PCP: Patricia Low, MD  Admit date: 12/31/2014 Discharge date: 01/03/2015  Time spent: 35 minutes  Recommendations for Outpatient Follow-up:  1. Recommend CBC plus differential + Bmet in 1 week 2. Do not give aspirin with anticoagulant Xarelto 3. If further issues would refer to [facility] GI Dr. Paulita Fujita to follow-up 4.  Should transition to Xarelto 20 mg dosing soon as per your MAR 5. Next step in evaluation could be capsule endoscopy-screening colonoscopy 2013 July was normal by Dr. Earle Roach 6. Given renal insuff would hold home doses Lasix and Lisinopril until labs are performed as OP  Discharge Diagnoses:  Principal Problem:   GI bleed due to NSAIDs Active Problems:   Multiple sclerosis diagnosed 2002-not on Therapy any longer   Anemia   MS (multiple sclerosis)   High blood pressure   Pressure ulcer   DVT, lower extremity   Discharge Condition: Stable  Diet recommendation: Heart healthy  Filed Weights   12/31/14 1500  Weight: 67.132 kg (148 lb)    History of present illness:  28- year-old female with Multiple sclerosis since 1995-diagnosed formally 2002-previously on Betaseron-WC bound from 2008 followed by [name] 09/2012 indicating patient did not like Betaseron and that she might not benefit from modifying therapy anymore, Type 2 diabetes mellitus, Mild anemia, prior episodes pyelonephritis, incisional hernia from a prior fibroidectomy s/p repair, prior PNA 06/2012.   She was recently admitted by me 12/13/14 for severe sepsis and coinicdentally found to have LE DVT [L Ext Iliac, CFA] and placed on Xarelto for this indication on discharge from the hospital. It was also noted that on discharge, the patient was kept on aspirin 81 mg in addition to Xarelto.  Her hemoglobin on admission was 6.6 from the nursing facility Gastroenterology saw the patient in  consult and because of a BUN/creatinine ratio that was elevated above baseline felt that this may be in keeping with an upper GI bleed Upper endoscopy performed on 7/22 showed no specific findings.  It was recommended that patient use PPI Protonix as well as be kept on iron pills Patient was felt stable for discharge and hemoglobin on discharge was 7.8 which was in keeping with diurnal variation and she had no acute bleeding from anywhere and was tolerating a diet without nausea vomiting or abdominal pain  She was clinically stable for discharge on 7/23 back to her nursing facility  Discharge Exam: Filed Vitals:   01/03/15 0535  BP: 125/57  Pulse: 77  Temp: 98 F (36.7 C)  Resp: 20    General: A pleasant oriented no apparent distress  Cardiovascular: S1-S2 no murmur rub or gallop  Respiratory: Clinically clear no sound  Abdomen soft nontender nondistended no epigastric tenderness   Discharge Instructions   Discharge Instructions    Diet - Roach sodium heart healthy    Complete by:  As directed      Increase activity slowly    Complete by:  As directed           Current Discharge Medication List    START taking these medications   Details  iron polysaccharides (NIFEREX) 150 MG capsule Take 1 capsule (150 mg total) by mouth daily.      CONTINUE these medications which have NOT CHANGED   Details  albuterol (PROVENTIL HFA;VENTOLIN HFA) 108 (90 BASE) MCG/ACT inhaler Inhale 2 puffs into the lungs every 4 (four) hours as needed for wheezing  or shortness of breath. Qty: 1 Inhaler, Refills: 4    baclofen (LIORESAL) 10 MG tablet Take 10 mg by mouth 2 (two) times daily.     buPROPion (WELLBUTRIN SR) 150 MG 12 hr tablet Take 150 mg by mouth 2 (two) times daily. Refills: 0    feeding supplement, ENSURE ENLIVE, (ENSURE ENLIVE) LIQD Take 237 mLs by mouth 2 (two) times daily between meals. Qty: 237 mL, Refills: 12    ferrous sulfate 325 (65 FE) MG tablet Take 1 tablet (325 mg  total) by mouth 2 (two) times daily. Qty: 30 tablet, Refills: 3    Lactobacillus (ACIDOPHILUS PO) Take 1 tablet by mouth 3 (three) times daily.    lovastatin (MEVACOR) 20 MG tablet Take 20 mg by mouth daily.    metFORMIN (GLUCOPHAGE) 500 MG tablet Take 500 mg by mouth 2 (two) times daily. Refills: 0    solifenacin (VESICARE) 5 MG tablet Take 5 mg by mouth daily.    tiZANidine (ZANAFLEX) 4 MG tablet Take 4 mg by mouth 2 (two) times daily.     Rivaroxaban (XARELTO STARTER PACK) 15 & 20 MG TBPK Take as directed on package: Start with one 15mg  tablet by mouth twice a day with food. On Day 22, switch to one 20mg  tablet once a day with food. Qty: 51 each, Refills: 0      STOP taking these medications     aspirin EC 81 MG tablet      lisinopril (PRINIVIL,ZESTRIL) 2.5 MG tablet      potassium chloride SA (K-DUR,KLOR-CON) 20 MEQ tablet      Rivaroxaban (XARELTO) 15 MG TABS tablet      furosemide (LASIX) 40 MG tablet        No Known Allergies    The results of significant diagnostics from this hospitalization (including imaging, microbiology, ancillary and laboratory) are listed below for reference.    Significant Diagnostic Studies: Ct Abdomen Pelvis W Contrast  12/15/2014   CLINICAL DATA:  Sepsis. Likely pyelonephritis. Multiple sclerosis. Hyperglycemia. Diabetes.  EXAM: CT ABDOMEN AND PELVIS WITH CONTRAST  TECHNIQUE: Multidetector CT imaging of the abdomen and pelvis was performed using the standard protocol following bolus administration of intravenous contrast.  CONTRAST:  69mL OMNIPAQUE IOHEXOL 300 MG/ML  SOLN  COMPARISON:  Abdominal ultrasound of 12/14/2014  FINDINGS: Lower chest: Degradation secondary to patient arm position, not raised above the head. 3 mm anterior right lung base nodule on image 4. Motion degradation. Left base atelectasis. Normal heart size. Trace left larger than right pleural effusions.  Hepatobiliary: Scattered too small to characterize liver lesions which  are likely small cysts or bile duct hamartomas. Normal gallbladder, without biliary ductal dilatation.  Pancreas: Normal, without mass or ductal dilatation.  Spleen: Normal  Adrenals/Urinary Tract: Normal right adrenal gland. Mild left adrenal thickening. Left renal collecting system stone within the lower pole measures 1.4 cm. A stone within the left ureteropelvic junction measures maximally 1.6 cm. There is minimal upstream caliectasis, as well as a left renal sinus cyst which measures 5.0 x 3.4 cm.  An interpolar right renal cortical cyst measures 2.3 cm. Proximal ureteric and renal pelvic wall thickening/ hyper enhancement is identified bilaterally. Subtly heterogeneous right renal enhancement on delayed images suggests pyelonephritis. The ureteric hyperenhancement suggests ascending infection. Urinary bladder collapsed around a Foley catheter.  Stomach/Bowel: Normal stomach, without wall thickening. Normal colon, appendix, and terminal ileum. Normal small bowel.  Vascular/Lymphatic: Aortic and branch vessel atherosclerosis. Age advanced. Increased number of small retroperitoneal nodes  which are likely reactive. None are pathologic by size criteria. Prominent but not pathologically enlarged pelvic sidewall nodes as well.  Filling defect within the left external iliac and common femoral vein, including on images 72-84.  Reproductive: Uterine fibroids.  No adnexal mass.  Other: No significant free fluid. A right femoral venous catheter is identified.  Musculoskeletal: No acute osseous abnormality.  IMPRESSION: 1. Ureteric and renal pelvic wall thickening/hyper enhancement, suspicious for ascending infection. Subtle heterogeneous right renal enhancement is suspicious for pyelonephritis. 2. Left nephrolithiasis, include left ureteropelvic junction dominant stone. Relatively mild upstream caliectasis. 3. Nonocclusive thrombus within the left external iliac and common femoral veins. These results were called by  telephone at the time of interpretation on 12/15/2014 at 7:32 pm to Timmothy Sours, r.n., who verbally acknowledged these results. 4. Uterine fibroids. 5. Trace bilateral pleural effusions. 6. Motion and position degraded exam. 7. Tiny right lung base nodule. If the patient is at high risk for bronchogenic carcinoma, follow-up chest CT at 1 year is recommended. If the patient is at Roach risk, no follow-up is needed. This recommendation follows the consensus statement: "Guidelines for Management of Small Pulmonary Nodules Detected on CT Scans: A Statement from the [employer]" as published in Radiology 2005; 237:395-400. Available online at: https://www.arnold.com/.   Electronically Signed   By: Abigail Miyamoto M.D.   On: 12/15/2014 19:35   US Renal  12/14/2014   CLINICAL DATA:  Acute renal failure.  Pyelonephritis.  EXAM: RENAL / URINARY TRACT ULTRASOUND COMPLETE  COMPARISON:  None.  FINDINGS: Right Kidney:  Length: 15.1 cm. Mildly increased echogenicity. Upper pole right renal cyst of 3.1 cm. Question renal pelvic wall thickening, including on image 17. 1.4 cm interpolar right renal cyst.  Left Kidney:  Length: 14.5 cm. Mildly increased echogenicity. Multiple collecting system stones, including a 2.3 cm stone on image 31. A central renal lesion which is likely a cyst or complex cyst measures 3.6 cm.  Bladder:  Foley catheter within. The bladder is not entirely decompressed. Wall thickening including at 1.7 cm.  Incidental note is made of gallstones.  IMPRESSION: 1. Increased renal echogenicity, suggesting medical renal disease. 2. Left-sided renal calculi. 3. Suggestion of right renal pelvic wall thickening, possibly representing ascending infection. 4. Right renal cyst. Left renal lesion which is likely a cyst or complex cyst. 5. Consider further evaluation with CT to evaluate the above findings. Ideally, this could be performed with and without contrast. If patient's renal function will not  allow contrast, stone study could be performed. 6. Foley catheter within the urinary bladder, not decompressed. this suggests catheter dysfunction. Concurrent wall thickening could represent bladder outlet obstruction or cystitis. 7. Cholelithiasis.   Electronically Signed   By: Abigail Miyamoto M.D.   On: 12/14/2014 09:24   Dg Chest Port 1 View  12/14/2014   CLINICAL DATA:  Central line placement. Multiple sclerosis. High blood pressure.  EXAM: PORTABLE CHEST - 1 VIEW  COMPARISON:  Earlier today at 130 hours.  FINDINGS: 654 hours. Numerous leads and wires project over the chest. No central line is identified. Midline trachea. Normal heart size. No pleural effusion or pneumothorax. Clear lungs. No congestive failure.  IMPRESSION: Despite the clinical history, no new central line is identified.  No acute cardiopulmonary disease.   Electronically Signed   By: Abigail Miyamoto M.D.   On: 12/14/2014 09:17   Dg Chest Port 1 View  12/14/2014   CLINICAL DATA:  Acute onset of shortness of breath. Sepsis. Initial encounter.  EXAM: PORTABLE CHEST - 1 VIEW  COMPARISON:  Chest radiograph from 12/13/2014  FINDINGS: The lungs are well-aerated and clear. There is no evidence of focal opacification, pleural effusion or pneumothorax.  The cardiomediastinal silhouette is within normal limits. No acute osseous abnormalities are seen.  IMPRESSION: No acute cardiopulmonary process seen.   Electronically Signed   By: Garald Balding M.D.   On: 12/14/2014 02:21   Dg Chest Portable 1 View  12/13/2014   CLINICAL DATA:  Altered mental status.  Hypotension.  EXAM: PORTABLE CHEST - 1 VIEW  COMPARISON:  06/27/2012  FINDINGS: Heart size and pulmonary vascularity are normal and the lungs are clear. No osseous abnormality.  IMPRESSION: Normal chest.   Electronically Signed   By: Lorriane Shire M.D.   On: 12/13/2014 15:55    Microbiology: No results found for this or any previous visit (from the past 240 hour(s)).   Labs: Basic Metabolic  Panel:  Recent Labs Lab 12/31/14 1215 01/01/15 0356  NA 146* 144  K 3.9 3.6  CL 115* 114*  CO2 24 24  GLUCOSE 98 77  BUN 22* 24*  CREATININE 0.89 0.82  CALCIUM 9.8 9.4   Liver Function Tests:  Recent Labs Lab 01/01/15 0356  AST 12*  ALT 14  ALKPHOS 47  BILITOT 1.7*  PROT 6.4*  ALBUMIN 2.7*   No results for input(s): LIPASE, AMYLASE in the last 168 hours. No results for input(s): AMMONIA in the last 168 hours. CBC:  Recent Labs Lab 12/31/14 1215 01/01/15 0356 01/02/15 0735 01/03/15 0745  WBC 10.6* 13.6* 12.2* 11.9*  NEUTROABS 6.4  --  6.9  --   HGB 6.6* 7.7* 8.4* 7.8*  HCT 20.4* 23.7* 25.7* 23.8*  MCV 90.3 89.4 90.8 91.5  PLT 353 299 313 330   Cardiac Enzymes: No results for input(s): CKTOTAL, CKMB, CKMBINDEX, TROPONINI in the last 168 hours. BNP: BNP (last 3 results)  Recent Labs  12/13/14 1620  BNP 56.9    ProBNP (last 3 results) No results for input(s): PROBNP in the last 8760 hours.  CBG:  Recent Labs Lab 01/02/15 1102 01/02/15 1248 01/02/15 1630 01/02/15 2010 01/03/15 0729  GLUCAP 84 84 116* 137* 86       Signed:  Haidan Nhan, [place]  [facility] 01/03/2015, 9:03 AM

## 2015-01-03 NOTE — Clinical Social Work Note (Signed)
CSW received a call that pt was ready for discharge back to [facility] called [facility], faxed discharge summary, prepared packet, provided it to RN and met with pt's family at bedside.  RN will call for transport when MD provides script  CSW signing off  .Dede Query, LCSW [facility] Clinical Social Worker - Weekend Coverage cell #: [phone]

## 2015-01-05 ENCOUNTER — Non-Acute Institutional Stay (SKILLED_NURSING_FACILITY): Payer: Medicare Other | Admitting: Internal Medicine

## 2015-01-05 ENCOUNTER — Encounter: Payer: Self-pay | Admitting: Internal Medicine

## 2015-01-05 DIAGNOSIS — G35 Multiple sclerosis: Secondary | ICD-10-CM

## 2015-01-05 DIAGNOSIS — E119 Type 2 diabetes mellitus without complications: Secondary | ICD-10-CM

## 2015-01-05 DIAGNOSIS — T39395A Adverse effect of other nonsteroidal anti-inflammatory drugs [NSAID], initial encounter: Secondary | ICD-10-CM | POA: Diagnosis not present

## 2015-01-05 DIAGNOSIS — K922 Gastrointestinal hemorrhage, unspecified: Secondary | ICD-10-CM

## 2015-01-05 DIAGNOSIS — I10 Essential (primary) hypertension: Secondary | ICD-10-CM

## 2015-01-05 DIAGNOSIS — I82402 Acute embolism and thrombosis of unspecified deep veins of left lower extremity: Secondary | ICD-10-CM | POA: Diagnosis not present

## 2015-01-05 NOTE — Progress Notes (Signed)
MRN: [id number] Name: [name]  Sex: female Age: 54 y.o. DOB: 1961-04-01  [facility] #: [name] Facility/Room:108 Level Of Care: SNF Provider: Inocencio Homes Roach Emergency Contacts: Extended Emergency Contact Information Primary Emergency Contact: [name], [place] [address] [address] Phone: [phone] Mobile Phone: [phone] Relation: Relative Secondary Emergency Contact: [name], [place] [address] [place] Phone: [phone] Relation: Relative  Code Status:   Allergies: Review of patient's allergies indicates no known allergies.  Chief Complaint  Patient presents with  . New Admit To SNF    HPI: Patient is 54 y.o. female who with Multiple sclerosis since 1995-diagnosed formally 2002-previously on Betaseron-WC bound from 2008 followed by Dr. Zenovia [facility] 09/2012 indicating patient did not like Betaseron and that she might not benefit from modifying therapy anymore, Type 2 diabetes mellitus, Mild anemia, prior episodes pyelonephritis, incisional hernia from a prior fibroidectomy s/p repair, prior PNA 06/2012. Pt had DVT dx recently while hospitalized for sepsis and was started on xarelto. Pt was admitted to hospital from 7/20 - 23 after SNF sent her to hospital for HB 6.2. Pt had had no sx or sign referable to bleeding. Pt underwent Upper GI at hospital which was neg. Research into EPIC record revealed tx 1 unit PRBC to a HB of 7.8 Pt is Roach/c to SNF on 7/23 with generalized weakness. While at SNF pt will be follwed for HTN, currently of lisinopril and lasix with plan to restart after labs at SNF, DVT, treated with xarelto at new dose of 20 mg daily and DM2 with metformin.  Past Medical History  Diagnosis Date  . MS (multiple sclerosis)   . Hyperglycemia   . Edema   . High blood pressure   . Diabetes mellitus without complication   . Pyelonephritis 12/16/2014    Past Surgical History   Procedure Laterality Date  . Hernia mesh removal    . Tubes tided    . Tubal ligation        Medication List       This list is accurate as of: 01/05/15  9:38 PM.  Always use your most recent med list.               ACIDOPHILUS PO  Take 1 tablet by mouth 3 (three) times daily.     albuterol 108 (90 BASE) MCG/ACT inhaler  Commonly known as:  PROVENTIL HFA;VENTOLIN HFA  Inhale 2 puffs into the lungs every 4 (four) hours as needed for wheezing or shortness of breath.     baclofen 10 MG tablet  Commonly known as:  LIORESAL  Take 10 mg by mouth 2 (two) times daily.     buPROPion 150 MG 12 hr tablet  Commonly known as:  WELLBUTRIN SR  Take 150 mg by mouth 2 (two) times daily.     feeding supplement (ENSURE ENLIVE) Liqd  Take 237 mLs by mouth 2 (two) times daily between meals.     iron polysaccharides 150 MG capsule  Commonly known as:  NIFEREX  Take 1 capsule (150 mg total) by mouth daily.     lovastatin 20 MG tablet  Commonly known as:  MEVACOR  Take 20 mg by mouth daily.     metFORMIN 500 MG tablet  Commonly known as:  GLUCOPHAGE  Take 500 mg by mouth 2 (two) times daily.     pantoprazole  40 MG tablet  Commonly known as:  PROTONIX  Take 1 tablet (40 mg total) by mouth daily.     Rivaroxaban 15 & 20 MG Tbpk  Commonly known as:  XARELTO STARTER PACK  Take as directed on package: Start with one 15mg  tablet by mouth twice a day with food. On Day 22, switch to one 20mg  tablet once a day with food.     solifenacin 5 MG tablet  Commonly known as:  VESICARE  Take 5 mg by mouth daily.     tiZANidine 4 MG tablet  Commonly known as:  ZANAFLEX  Take 4 mg by mouth 2 (two) times daily.        No orders of the defined types were placed in this encounter.    Immunization History  Administered Date(s) Administered  . Influenza Split 06/26/2012    History  Substance Use Topics  . Smoking status: Former Smoker -- 1.00 packs/day for 20 years    Types: Cigarettes     Quit date: 11/19/2014  . Smokeless tobacco: Never Used  . Alcohol Use: 3.0 oz/week    5 Glasses of wine per week    Family history is  +DM, alzheimers  Review of Systems  DATA OBTAINED: from patient, nurse GENERAL:  no fevers, fatigue, appetite changes SKIN: No itching, rash  EYES: No eye pain, redness, discharge EARS: No earache, tinnitus, change in hearing NOSE: No congestion, drainage or bleeding  MOUTH/THROAT: No mouth or tooth pain, No sore throat RESPIRATORY: No cough, wheezing, SOB CARDIAC: No chest pain, palpitations, lower extremity edema  GI: No abdominal pain, No N/V/Roach or constipation, No heartburn or reflux  GU: No dysuria, frequency or urgency, or incontinence  MUSCULOSKELETAL: No unrelieved bone/joint pain NEUROLOGIC: No headache, dizziness or focal weakness PSYCHIATRIC: No c/o anxiety or sadness   Filed Vitals:   01/05/15 1139  BP: 135/79  Pulse: 82  Temp: 98.5 F (36.9 C)  Resp: 18    SpO2 Readings from Last 1 Encounters:  01/03/15 99%        Physical Exam  GENERAL APPEARANCE: Alert, min  conversant,  No acute distress.  SKIN: No diaphoresis rash HEAD: Normocephalic, atraumatic  EYES: Conjunctiva/lids clear. Pupils round, reactive. EOMs intact.  EARS: External exam WNL, canals clear. Hearing grossly normal.  NOSE: No deformity or discharge.  MOUTH/THROAT: Lips w/o lesions  RESPIRATORY: Breathing is even, unlabored. Lung sounds are clear   CARDIOVASCULAR: Heart RRR no murmurs, rubs or gallops. No peripheral edema.   GASTROINTESTINAL: Abdomen is soft, non-tender, not distended w/ normal bowel sounds. GENITOURINARY: Bladder non tender, not distended  MUSCULOSKELETAL: No abnormal joints or musculature NEUROLOGIC:  Cranial nerves 2-12 grossly intact. Moves all extremities  PSYCHIATRIC:  no behavioral issues  Patient Active Problem List   Diagnosis Date Noted  . GI bleed due to NSAIDs 12/31/2014  . Urinary tract infection due to Proteus  12/28/2014  . Acute encephalopathy 12/28/2014  . Electrolyte abnormality 12/28/2014  . DVT, lower extremity 12/28/2014  . Transaminitis 12/28/2014  . Pressure ulcer 12/15/2014  . Acute renal failure   . Septic shock 12/14/2014  . AKI (acute kidney injury) 12/13/2014  . At high risk for severe sepsis 12/13/2014  . Metabolic acidosis 46/27/0350  . Sepsis 12/13/2014  . MS (multiple sclerosis)   . Hyperglycemia   . Edema   . High blood pressure   . Acute blood loss anemia 06/28/2012  . UTI (lower urinary tract infection) 06/26/2012  . Multiple sclerosis diagnosed 2002-not  on Therapy any longer 06/26/2012  . Bilateral leg edema 06/26/2012  . Diabetes mellitus 06/26/2012  . Community acquired pneumonia 06/25/2012    CBC    Component Value Date/Time   WBC 11.9* 01/03/2015 0745   RBC 2.60* 01/03/2015 0745   RBC 2.27* 12/31/2014 1412   HGB 7.8* 01/03/2015 0745   HCT 23.8* 01/03/2015 0745   PLT 330 01/03/2015 0745   MCV 91.5 01/03/2015 0745   LYMPHSABS 4.1* 01/02/2015 0735   MONOABS 0.8 01/02/2015 0735   EOSABS 0.4 01/02/2015 0735   BASOSABS 0.1 01/02/2015 0735    CMP     Component Value Date/Time   NA 144 01/01/2015 0356   K 3.6 01/01/2015 0356   CL 114* 01/01/2015 0356   CO2 24 01/01/2015 0356   GLUCOSE 77 01/01/2015 0356   BUN 24* 01/01/2015 0356   CREATININE 0.82 01/01/2015 0356   CALCIUM 9.4 01/01/2015 0356   PROT 6.4* 01/01/2015 0356   ALBUMIN 2.7* 01/01/2015 0356   AST 12* 01/01/2015 0356   ALT 14 01/01/2015 0356   ALKPHOS 47 01/01/2015 0356   BILITOT 1.7* 01/01/2015 0356   GFRNONAA >60 01/01/2015 0356   GFRAA >60 01/01/2015 0356    Lab Results  Component Value Date   HGBA1C 7.4* 06/26/2012     No results found.  Not all labs, radiology exams or other studies done during hospitalization come through on my EPIC note; however they are reviewed by me.    Assessment and Plan  GI bleed due to NSAIDs HB 6.2 at SNF anf 6.6 in ED;pt was tx in  hospital;SNF plan - CBC within a week, cont iron  High blood pressure SNF plan - holding lasix and lisinopril until BMP drawn in 5-7 days  DVT, lower extremity SNF plan - xarelto at increased dose of 20 mg daily  Diabetes mellitus SNF plan - resume glucophage 500 mg BID  MS (multiple sclerosis) Plan - outpt f/u with neurology if pt desires  Acute blood loss anemia Roach/c Hb 7.8 SNF plan -follow CBC    Hennie Duos, MD

## 2015-01-05 NOTE — Assessment & Plan Note (Signed)
HB 6.2 at SNF anf 6.6 in ED;pt was tx in hospital;SNF plan - CBC within a week, cont iron

## 2015-01-05 NOTE — Assessment & Plan Note (Signed)
SNF plan - holding lasix and lisinopril until BMP drawn in 5-7 days

## 2015-01-05 NOTE — Assessment & Plan Note (Signed)
SNF plan - resume glucophage 500 mg BID

## 2015-01-05 NOTE — Assessment & Plan Note (Signed)
Plan - outpt f/u with neurology if pt desires

## 2015-01-05 NOTE — Assessment & Plan Note (Signed)
SNF plan - xarelto at increased dose of 20 mg daily

## 2015-01-05 NOTE — Assessment & Plan Note (Signed)
D/c Hb 7.8 SNF plan -follow CBC

## 2015-01-06 ENCOUNTER — Encounter (HOSPITAL_COMMUNITY): Payer: Self-pay | Admitting: Gastroenterology

## 2015-01-09 ENCOUNTER — Encounter: Payer: Self-pay | Admitting: Nurse Practitioner

## 2015-01-09 ENCOUNTER — Non-Acute Institutional Stay (SKILLED_NURSING_FACILITY): Payer: Medicare Other | Admitting: Nurse Practitioner

## 2015-01-09 DIAGNOSIS — E119 Type 2 diabetes mellitus without complications: Secondary | ICD-10-CM | POA: Diagnosis not present

## 2015-01-09 DIAGNOSIS — G35 Multiple sclerosis: Secondary | ICD-10-CM | POA: Diagnosis not present

## 2015-01-09 DIAGNOSIS — D649 Anemia, unspecified: Secondary | ICD-10-CM | POA: Diagnosis not present

## 2015-01-09 DIAGNOSIS — T39395A Adverse effect of other nonsteroidal anti-inflammatory drugs [NSAID], initial encounter: Secondary | ICD-10-CM | POA: Diagnosis not present

## 2015-01-09 DIAGNOSIS — I10 Essential (primary) hypertension: Secondary | ICD-10-CM

## 2015-01-09 DIAGNOSIS — I82402 Acute embolism and thrombosis of unspecified deep veins of left lower extremity: Secondary | ICD-10-CM | POA: Diagnosis not present

## 2015-01-09 DIAGNOSIS — K922 Gastrointestinal hemorrhage, unspecified: Secondary | ICD-10-CM | POA: Diagnosis not present

## 2015-01-09 NOTE — Progress Notes (Signed)
Nursing Home Location:  [facility]   Place of Service: SNF (31)  PCP: [name], MD  No Known Allergies  Chief Complaint  Patient presents with  . Discharge Note    HPI:  Patient is a 54 y.o. female seen today at [facility] for discharge home. Pt with PMH of MS, DM, anemia, recent DVT now on xarelto. Pt was ordinally admitted to [facility] after hospitalization from sepsis due to UTI, also found to have DVT then she was sent back to the hospital from 7/20 - 23 after SNF for hgb 6.2. Pt had had no sx or sign referable to bleeding. Pt underwent Upper GI at hospital which was neg. Pt is requesting to go home at this time, there has been no follow up CBC done but pt remains stable without signs of bleeding.  Pt agrees for outpatient therapy and follow up from PCP.  Patient currently doing well with therapy, now stable to discharge home with family and home health therapy.   Review of Systems:  Review of Systems  Constitutional: Negative for activity change, appetite change, fatigue and unexpected weight change.  HENT: Negative for congestion and hearing loss.   Eyes: Negative.   Respiratory: Negative for cough and shortness of breath.   Cardiovascular: Negative for chest pain, palpitations and leg swelling.  Gastrointestinal: Negative for abdominal pain, diarrhea and constipation.  Genitourinary: Negative for dysuria and difficulty urinating.  Musculoskeletal: Negative for myalgias and arthralgias.  Skin: Negative for color change and wound.  Neurological: Negative for dizziness and weakness.  Psychiatric/Behavioral: Negative for behavioral problems, confusion and agitation.    Past Medical History  Diagnosis Date  . MS (multiple sclerosis)   . Hyperglycemia   . Edema   . High blood pressure   . Diabetes mellitus without complication   . Pyelonephritis 12/16/2014   Past Surgical History  Procedure Laterality Date  . Hernia mesh removal     . Tubes tided    . Tubal ligation    . Esophagogastroduodenoscopy Left 01/02/2015    Procedure: ESOPHAGOGASTRODUODENOSCOPY (EGD);  Surgeon: Arta Silence, MD;  Location: [facility];  Service: Endoscopy;  Laterality: Left;   Social History:   reports that she quit smoking about 7 weeks ago. Her smoking use included Cigarettes. She has a 20 pack-year smoking history. She has never used smokeless tobacco. She reports that she drinks about 3.0 oz of alcohol per week. She reports that she does not use illicit drugs.  Family History  Problem Relation Age of Onset  . Diabetes Mother   . Diabetes Father   . Diabetes Sister   . Scoliosis Sister   . Alzheimer's disease Mother     Medications: Patient's Medications  New Prescriptions   No medications on file  Previous Medications   ALBUTEROL (PROVENTIL HFA;VENTOLIN HFA) 108 (90 BASE) MCG/ACT INHALER    Inhale 2 puffs into the lungs every 4 (four) hours as needed for wheezing or shortness of breath.   BACLOFEN (LIORESAL) 10 MG TABLET    Take 10 mg by mouth 2 (two) times daily.    BUPROPION (WELLBUTRIN SR) 150 MG 12 HR TABLET    Take 150 mg by mouth 2 (two) times daily.   IRON POLYSACCHARIDES (NIFEREX) 150 MG CAPSULE    Take 1 capsule (150 mg total) by mouth daily.   LACTOBACILLUS (ACIDOPHILUS PO)    Take 1 tablet by mouth 3 (three) times daily.   LOVASTATIN (MEVACOR) 20 MG TABLET  Take 20 mg by mouth daily.   METFORMIN (GLUCOPHAGE) 500 MG TABLET    Take 500 mg by mouth 2 (two) times daily.   PANTOPRAZOLE (PROTONIX) 40 MG TABLET    Take 1 tablet (40 mg total) by mouth daily.   RIVAROXABAN (XARELTO) 20 MG TABS TABLET    Take 20 mg by mouth daily.   SOLIFENACIN (VESICARE) 5 MG TABLET    Take 5 mg by mouth daily.   TIZANIDINE (ZANAFLEX) 4 MG TABLET    Take 4 mg by mouth 2 (two) times daily.   Modified Medications   No medications on file  Discontinued Medications   FEEDING SUPPLEMENT, ENSURE ENLIVE, (ENSURE ENLIVE) LIQD    Take 237 mLs by  mouth 2 (two) times daily between meals.   RIVAROXABAN (XARELTO STARTER PACK) 15 & 20 MG TBPK    Take as directed on package: Start with one 15mg  tablet by mouth twice a day with food. On Day 22, switch to one 20mg  tablet once a day with food.     Physical Exam: Filed Vitals:   01/09/15 1513  BP: 137/80  Pulse: 58  Temp: 98 F (36.7 C)  Resp: 18  Weight: 143 lb (64.864 kg)    Physical Exam  Constitutional: She is oriented to person, place, and time. No distress.  HENT:  Head: Normocephalic and atraumatic.  Mouth/Throat: Oropharynx is clear and moist. No oropharyngeal exudate.  Eyes: Conjunctivae are normal. Pupils are equal, round, and reactive to light.  Neck: Normal range of motion. Neck supple.  Cardiovascular: Normal rate, regular rhythm and normal heart sounds.   Pulmonary/Chest: Effort normal and breath sounds normal.  Abdominal: Soft. Bowel sounds are normal.  Musculoskeletal: She exhibits no edema or tenderness.  Neurological: She is alert and oriented to person, place, and time.  Skin: Skin is warm. She is not diaphoretic.  Psychiatric: She has a normal mood and affect.    Labs reviewed: Basic Metabolic Panel:  Recent Labs  12/13/14 1620 12/14/14 0535  12/16/14 0436  12/19/14 0420 12/20/14 0255 12/21/14 0425 12/31/14 1215 01/01/15 0356  NA 141 144  < > 136  < > 140 139 139 146* 144  K 5.7* 4.1  < > 3.4*  < > 3.6 3.6 3.7 3.9 3.6  CL 108 119*  < > 112*  < > 111 108 107 115* 114*  CO2 18* 17*  < > 17*  < > 25 25 27 24 24   GLUCOSE 74 155*  < > 126*  < > 85 81 87 98 77  BUN 118* 79*  < > 13  < > 10 12 7  22* 24*  CREATININE 4.74* 2.71*  < > 0.86  < > 0.67 0.56 0.61 0.89 0.82  CALCIUM 10.6* 8.9  < > 7.0*  < > 8.8* 8.8* 8.7* 9.8 9.4  MG 2.2 1.7  --  1.0*  < > 1.8 1.6* 1.8  --   --   PHOS 5.6* 3.7  --  2.1*  --   --   --   --   --   --   < > = values in this interval not displayed. Liver Function Tests:  Recent Labs  12/17/14 1632 12/21/14 0425  01/01/15 0356  AST 24 18 12*  ALT 66* 38 14  ALKPHOS 99 67 47  BILITOT 0.3 0.3 1.7*  PROT 5.8* 5.7* 6.4*  ALBUMIN 2.1* 2.0* 2.7*   No results for input(s): LIPASE, AMYLASE in the last 8760 hours.  No results for input(s): AMMONIA in the last 8760 hours. CBC:  Recent Labs  12/16/14 0436  12/31/14 1215 01/01/15 0356 01/02/15 0735 01/03/15 0745  WBC 7.6  < > 10.6* 13.6* 12.2* 11.9*  NEUTROABS 6.0  --  6.4  --  6.9  --   HGB 7.9*  < > 6.6* 7.7* 8.4* 7.8*  HCT 23.0*  < > 20.4* 23.7* 25.7* 23.8*  MCV 86.1  < > 90.3 89.4 90.8 91.5  PLT 230  < > 353 299 313 330  < > = values in this interval not displayed. TSH: No results for input(s): TSH in the last 8760 hours. A1C: Lab Results  Component Value Date   HGBA1C 7.4* 06/26/2012   Lipid Panel: No results for input(s): CHOL, HDL, LDLCALC, TRIG, CHOLHDL, LDLDIRECT in the last 8760 hours.   Assessment/Plan 1. DVT, lower extremity, left Stable, conts on xarelto 20 mg daily  2. Type 2 diabetes mellitus without complication Stable, conts on metformin BID  3. MS (multiple sclerosis) -stable conts on baclofen, vesicare and zanaflex  4. Essential hypertension Blood pressure stable, off all medications at this time, will need to be followed by PCP  5. GI Bleed -no acute finding from endoscopy, to cont PPI and iron  6. Anemia Follow up CBC was not obtained while at [facility]. Pt without signs of bleeding, VS stable, to cont on iron -to follow up with PCP in 1 week for follow up blood work   pt is stable for discharge-will need PT/OT/ per home health. No DME needed. Rx written.  will need to follow up with PCP within 1 week of discharge for follow up CBC and BMP.   Carlos American. Harle Battiest  [facility] [phone] 8 am - 5 pm) [phone] (after hours)

## 2015-01-20 DIAGNOSIS — I82422 Acute embolism and thrombosis of left iliac vein: Secondary | ICD-10-CM | POA: Diagnosis not present

## 2015-01-20 DIAGNOSIS — I82412 Acute embolism and thrombosis of left femoral vein: Secondary | ICD-10-CM | POA: Diagnosis not present

## 2015-01-20 DIAGNOSIS — G9341 Metabolic encephalopathy: Secondary | ICD-10-CM | POA: Diagnosis not present

## 2015-01-20 DIAGNOSIS — D649 Anemia, unspecified: Secondary | ICD-10-CM | POA: Diagnosis not present

## 2015-01-20 DIAGNOSIS — G35 Multiple sclerosis: Secondary | ICD-10-CM | POA: Diagnosis not present

## 2015-01-20 DIAGNOSIS — I10 Essential (primary) hypertension: Secondary | ICD-10-CM | POA: Diagnosis not present

## 2015-01-27 ENCOUNTER — Encounter (HOSPITAL_COMMUNITY): Payer: Self-pay | Admitting: Emergency Medicine

## 2015-01-27 ENCOUNTER — Emergency Department (HOSPITAL_COMMUNITY)
Admission: EM | Admit: 2015-01-27 | Discharge: 2015-01-28 | Disposition: A | Discharge status: Home / Self Care | Payer: Medicare Other | Attending: Emergency Medicine | Admitting: Emergency Medicine

## 2015-01-27 DIAGNOSIS — R799 Abnormal finding of blood chemistry, unspecified: Secondary | ICD-10-CM | POA: Diagnosis present

## 2015-01-27 DIAGNOSIS — Z8669 Personal history of other diseases of the nervous system and sense organs: Secondary | ICD-10-CM | POA: Diagnosis not present

## 2015-01-27 DIAGNOSIS — Z79899 Other long term (current) drug therapy: Secondary | ICD-10-CM | POA: Diagnosis not present

## 2015-01-27 DIAGNOSIS — Z7901 Long term (current) use of anticoagulants: Secondary | ICD-10-CM | POA: Diagnosis not present

## 2015-01-27 DIAGNOSIS — Z87448 Personal history of other diseases of urinary system: Secondary | ICD-10-CM | POA: Insufficient documentation

## 2015-01-27 DIAGNOSIS — Z87891 Personal history of nicotine dependence: Secondary | ICD-10-CM | POA: Diagnosis not present

## 2015-01-27 DIAGNOSIS — D649 Anemia, unspecified: Secondary | ICD-10-CM | POA: Insufficient documentation

## 2015-01-27 DIAGNOSIS — E119 Type 2 diabetes mellitus without complications: Secondary | ICD-10-CM | POA: Diagnosis not present

## 2015-01-27 NOTE — ED Notes (Signed)
Pt brought to ED by [facility] from home. Pt states she was dc home from ICU on July on Xarelto, pt no able to explain why she is on Xarelto. Pt state that her PCD called her around 5pm to tell her she needs to be evaluated on the ED for possible internal bleed due to abnormal lab, pt denies pain, fever or other complain.

## 2015-01-27 NOTE — ED Provider Notes (Signed)
CSN: [id number]     Arrival date & time 01/27/15  2327 History   This chart was scribed for Patricia Hacker, MD by [name], ED Scribe. This patient was seen in room D32C/D32C and the patient's care was started 11:47 PM.   Chief Complaint  Patient presents with  . Abnormal Lab   The history is provided by the patient. No language interpreter was used.    HPI Comments: [name] brought in by EMS from home is a 54 y.o. female with a PMHx of MS, HTN, and DM who presents to the Emergency Department here for abnormal labs this evening.  Pt was discharged from ICU in July and started on Xarelto for a blood clot to her lower extremity. She subsequently was readmitted for a GI bleed thought to be secondary to NSAID use. Endoscopy showed nonspecific findings. [name] received a call from his PCP recommending further evaluation in the Emergency Department for possible bleed after receiving abnormal labs. Patient is a generally poor historian and cannot, which labs were abnormal. Denies any pain at this time. No recent fever, chills, nausea, vomiting, chest pain, or shortness of breath. No black or tarry stools. No blood noted in stools. No known allergies to medications.  Past Medical History  Diagnosis Date  . MS (multiple sclerosis)   . Hyperglycemia   . Edema   . High blood pressure   . Diabetes mellitus without complication   . Pyelonephritis 12/16/2014   Past Surgical History  Procedure Laterality Date  . Hernia mesh removal    . Tubes tided    . Tubal ligation    . Esophagogastroduodenoscopy Left 01/02/2015    Procedure: ESOPHAGOGASTRODUODENOSCOPY (EGD);  Surgeon: Arta Silence, MD;  Location: [facility];  Service: Endoscopy;  Laterality: Left;   Family History  Problem Relation Age of Onset  . Diabetes Mother   . Diabetes Father   . Diabetes Sister   . Scoliosis Sister   . Alzheimer's disease Mother    Social History  Substance Use Topics  . Smoking status:  Former Smoker -- 1.00 packs/day for 20 years    Types: Cigarettes    Quit date: 11/19/2014  . Smokeless tobacco: Never Used  . Alcohol Use: 3.0 oz/week    5 Glasses of wine per week   OB History    No data available     Review of Systems  Constitutional: Negative for fever and chills.  Respiratory: Negative for cough, chest tightness and shortness of breath.   Cardiovascular: Negative for chest pain.  Gastrointestinal: Negative for nausea, vomiting, abdominal pain, diarrhea and blood in stool.  Genitourinary: Negative for dysuria.  Musculoskeletal: Negative for back pain and arthralgias.  Skin: Negative for rash and wound.  Neurological: Negative for weakness and headaches.  Psychiatric/Behavioral: Negative for confusion.  All other systems reviewed and are negative.     Allergies  Review of patient's allergies indicates no known allergies.  Home Medications   Prior to Admission medications   Medication Sig Start Date End Date Taking? Authorizing Provider  albuterol (PROVENTIL HFA;VENTOLIN HFA) 108 (90 BASE) MCG/ACT inhaler Inhale 2 puffs into the lungs every 4 (four) hours as needed for wheezing or shortness of breath. Patient taking differently: Inhale 2 puffs into the lungs 2 (two) times daily as needed for wheezing or shortness of breath. PROAIR 06/29/12   Patricia Low, MD  baclofen (LIORESAL) 10 MG tablet Take 10 mg by mouth 2 (two) times daily.  Historical Provider, MD  buPROPion (WELLBUTRIN SR) 150 MG 12 hr tablet Take 150 mg by mouth 2 (two) times daily. 11/21/14   Historical Provider, MD  iron polysaccharides (NIFEREX) 150 MG capsule Take 1 capsule (150 mg total) by mouth daily. 01/03/15   Patricia Sells, MD  Lactobacillus (ACIDOPHILUS PO) Take 1 tablet by mouth 3 (three) times daily.    Historical Provider, MD  lovastatin (MEVACOR) 20 MG tablet Take 20 mg by mouth daily.    Historical Provider, MD  metFORMIN (GLUCOPHAGE) 500 MG tablet Take 500 mg by mouth 2  (two) times daily. 12/08/14   Historical Provider, MD  pantoprazole (PROTONIX) 40 MG tablet Take 1 tablet (40 mg total) by mouth daily. 01/03/15   Patricia Sells, MD  rivaroxaban (XARELTO) 20 MG TABS tablet Take 20 mg by mouth daily.    Historical Provider, MD  solifenacin (VESICARE) 5 MG tablet Take 5 mg by mouth daily.    Historical Provider, MD  tiZANidine (ZANAFLEX) 4 MG tablet Take 4 mg by mouth 2 (two) times daily.     Historical Provider, MD   Triage Vitals: BP 128/67 mmHg  Pulse 76  Temp(Src) 98.3 F (36.8 C) (Oral)  Resp 15  Ht 5\' 5"  (1.651 m)  Wt 149 lb (67.586 kg)  BMI 24.79 kg/m2  SpO2 100%   Physical Exam  Constitutional: She is oriented to person, place, and time. No distress.  Chronically ill-appearing, frail  HENT:  Head: Normocephalic and atraumatic.  Cardiovascular: Normal rate, regular rhythm and normal heart sounds.   No murmur heard. Pulmonary/Chest: Effort normal and breath sounds normal. No respiratory distress. She has no wheezes.  Abdominal: Soft. Bowel sounds are normal. There is no tenderness. There is no rebound.  Neurological: She is alert and oriented to person, place, and time.  Baseline weakness secondary to MS  Skin: Skin is warm and dry.  Psychiatric: She has a normal mood and affect.  Nursing note and vitals reviewed.   ED Course  Procedures (including critical care time)  DIAGNOSTIC STUDIES: Oxygen Saturation is 99% on RA, Normal by my interpretation.    COORDINATION OF CARE: 11:52 PM- Will order occult blood, CBC, and BMP. Discussed treatment plan with pt at bedside and pt agreed to plan.     Labs Review Labs Reviewed  CBC WITH DIFFERENTIAL/PLATELET - Abnormal; Notable for the following:    WBC 13.1 (*)    RBC 2.71 (*)    Hemoglobin 7.6 (*)    HCT 24.5 (*)    RDW 19.0 (*)    Platelets 525 (*)    Lymphs Abs 4.8 (*)    All other components within normal limits  BASIC METABOLIC PANEL - Abnormal; Notable for the following:     Glucose, Bld 178 (*)    All other components within normal limits  POC OCCULT BLOOD, ED    Imaging Review No results found. I have personally reviewed and evaluated these images and lab results as part of my medical decision-making.   EKG Interpretation None      MDM   Final diagnoses:  Anemia, unspecified anemia type    Patient presents with reported abnormal labs from her primary physician. Presumably persistent anemia. No physical complaints at this time.  Patient is at her baseline. Lab work obtained.  Hemoccult-negative. A Schanz hemoglobin is 7.6. This is consistent with discharge hemoglobin of 7.8.  Given that the patient is without complaint and there is no evidence of ongoing bleed, will discharge home  and have her follow-up with her primary physician.  After history, exam, and medical workup I feel the patient has been appropriately medically screened and is safe for discharge home. Pertinent diagnoses were discussed with the patient. Patient was given return precautions.  I personally performed the services described in this documentation, which was scribed in my presence. The recorded information has been reviewed and is accurate.   Patricia Hacker, MD 01/29/15 [id number]

## 2015-01-28 DIAGNOSIS — D649 Anemia, unspecified: Secondary | ICD-10-CM | POA: Diagnosis not present

## 2015-01-28 LAB — CBC WITH DIFFERENTIAL/PLATELET
Basophils Absolute: 0.1 10*3/uL (ref 0.0–0.1)
Basophils Relative: 1 % (ref 0–1)
Eosinophils Absolute: 0.4 10*3/uL (ref 0.0–0.7)
Eosinophils Relative: 3 % (ref 0–5)
HEMATOCRIT: 24.5 % — AB (ref 36.0–46.0)
HEMOGLOBIN: 7.6 g/dL — AB (ref 12.0–15.0)
LYMPHS ABS: 4.8 10*3/uL — AB (ref 0.7–4.0)
Lymphocytes Relative: 37 % (ref 12–46)
MCH: 28 pg (ref 26.0–34.0)
MCHC: 31 g/dL (ref 30.0–36.0)
MCV: 90.4 fL (ref 78.0–100.0)
MONOS PCT: 8 % (ref 3–12)
Monocytes Absolute: 1 10*3/uL (ref 0.1–1.0)
NEUTROS ABS: 6.8 10*3/uL (ref 1.7–7.7)
NEUTROS PCT: 52 % (ref 43–77)
Platelets: 525 10*3/uL — ABNORMAL HIGH (ref 150–400)
RBC: 2.71 MIL/uL — ABNORMAL LOW (ref 3.87–5.11)
RDW: 19 % — ABNORMAL HIGH (ref 11.5–15.5)
WBC: 13.1 10*3/uL — ABNORMAL HIGH (ref 4.0–10.5)

## 2015-01-28 LAB — BASIC METABOLIC PANEL
Anion gap: 8 (ref 5–15)
BUN: 12 mg/dL (ref 6–20)
CHLORIDE: 106 mmol/L (ref 101–111)
CO2: 24 mmol/L (ref 22–32)
CREATININE: 0.67 mg/dL (ref 0.44–1.00)
Calcium: 9.8 mg/dL (ref 8.9–10.3)
GFR calc Af Amer: >60 mL/min (ref >60)
GFR calc non Af Amer: >60 mL/min (ref >60)
Glucose, Bld: 178 mg/dL — ABNORMAL HIGH (ref 65–99)
Potassium: 3.7 mmol/L (ref 3.5–5.1)
Sodium: 138 mmol/L (ref 135–145)

## 2015-01-28 LAB — POC OCCULT BLOOD, ED: Fecal Occult Bld: NEGATIVE

## 2015-01-28 NOTE — Discharge Instructions (Signed)
You were seen today for abnormal blood tests. Your hemoglobin is stable from discharge. You needs follow-up with your primary physician for repeat CBC in one week.  Continue Iron.  Anemia, Nonspecific Anemia is a condition in which the concentration of red blood cells or hemoglobin in the blood is below normal. Hemoglobin is a substance in red blood cells that carries oxygen to the tissues of the body. Anemia results in not enough oxygen reaching these tissues.  CAUSES  Common causes of anemia include:   Excessive bleeding. Bleeding may be internal or external. This includes excessive bleeding from periods (in women) or from the intestine.   Poor nutrition.   Chronic kidney, thyroid, and liver disease.  Bone marrow disorders that decrease red blood cell production.  Cancer and treatments for cancer.  HIV, AIDS, and their treatments.  Spleen problems that increase red blood cell destruction.  Blood disorders.  Excess destruction of red blood cells due to infection, medicines, and autoimmune disorders. SIGNS AND SYMPTOMS   Minor weakness.   Dizziness.   Headache.  Palpitations.   Shortness of breath, especially with exercise.   Paleness.  Cold sensitivity.  Indigestion.  Nausea.  Difficulty sleeping.  Difficulty concentrating. Symptoms may occur suddenly or they may develop slowly.  DIAGNOSIS  Additional blood tests are often needed. These help your health care provider determine the best treatment. Your health care provider will check your stool for blood and look for other causes of blood loss.  TREATMENT  Treatment varies depending on the cause of the anemia. Treatment can include:   Supplements of iron, vitamin I34, or folic acid.   Hormone medicines.   A blood transfusion. This may be needed if blood loss is severe.   Hospitalization. This may be needed if there is significant continual blood loss.   Dietary changes.  Spleen removal. HOME  CARE INSTRUCTIONS Keep all follow-up appointments. It often takes many weeks to correct anemia, and having your health care provider check on your condition and your response to treatment is very important. SEEK IMMEDIATE MEDICAL CARE IF:   You develop extreme weakness, shortness of breath, or chest pain.   You become dizzy or have trouble concentrating.  You develop heavy vaginal bleeding.   You develop a rash.   You have bloody or black, tarry stools.   You faint.   You vomit up blood.   You vomit repeatedly.   You have abdominal pain.  You have a fever or persistent symptoms for more than 2-3 days.   You have a fever and your symptoms suddenly get worse.   You are dehydrated.  MAKE SURE YOU:  Understand these instructions.  Will watch your condition.  Will get help right away if you are not doing well or get worse. Document Released: 07/07/2004 Document Revised: 01/30/2013 Document Reviewed: 11/23/2012 [facility] Patient Information 2015 [place], [place]. This information is not intended to replace advice given to you by your health care provider. Make sure you discuss any questions you have with your health care provider.

## 2015-02-26 ENCOUNTER — Other Ambulatory Visit: Payer: Self-pay | Admitting: Internal Medicine

## 2015-02-26 DIAGNOSIS — R609 Edema, unspecified: Secondary | ICD-10-CM

## 2015-03-02 ENCOUNTER — Ambulatory Visit
Admission: RE | Admit: 2015-03-02 | Discharge: 2015-03-02 | Disposition: A | Discharge status: Home / Self Care | Payer: Medicare Other | Source: Ambulatory Visit | Attending: Internal Medicine | Admitting: Internal Medicine

## 2015-03-02 ENCOUNTER — Other Ambulatory Visit: Payer: Medicare Other

## 2015-03-02 DIAGNOSIS — R609 Edema, unspecified: Secondary | ICD-10-CM

## 2015-06-01 ENCOUNTER — Emergency Department (HOSPITAL_COMMUNITY)
Admission: EM | Admit: 2015-06-01 | Discharge: 2015-06-01 | Disposition: A | Discharge status: Home / Self Care | Payer: Medicare Other | Attending: Emergency Medicine | Admitting: Emergency Medicine

## 2015-06-01 DIAGNOSIS — Z8669 Personal history of other diseases of the nervous system and sense organs: Secondary | ICD-10-CM | POA: Diagnosis not present

## 2015-06-01 DIAGNOSIS — Z87891 Personal history of nicotine dependence: Secondary | ICD-10-CM | POA: Insufficient documentation

## 2015-06-01 DIAGNOSIS — G8929 Other chronic pain: Secondary | ICD-10-CM | POA: Insufficient documentation

## 2015-06-01 DIAGNOSIS — Z79899 Other long term (current) drug therapy: Secondary | ICD-10-CM | POA: Diagnosis not present

## 2015-06-01 DIAGNOSIS — Z86718 Personal history of other venous thrombosis and embolism: Secondary | ICD-10-CM | POA: Insufficient documentation

## 2015-06-01 DIAGNOSIS — Z8742 Personal history of other diseases of the female genital tract: Secondary | ICD-10-CM | POA: Insufficient documentation

## 2015-06-01 DIAGNOSIS — M7981 Nontraumatic hematoma of soft tissue: Secondary | ICD-10-CM | POA: Diagnosis not present

## 2015-06-01 DIAGNOSIS — Z7901 Long term (current) use of anticoagulants: Secondary | ICD-10-CM | POA: Diagnosis not present

## 2015-06-01 DIAGNOSIS — E119 Type 2 diabetes mellitus without complications: Secondary | ICD-10-CM | POA: Diagnosis not present

## 2015-06-01 DIAGNOSIS — M7989 Other specified soft tissue disorders: Secondary | ICD-10-CM | POA: Insufficient documentation

## 2015-06-01 DIAGNOSIS — Z7984 Long term (current) use of oral hypoglycemic drugs: Secondary | ICD-10-CM | POA: Insufficient documentation

## 2015-06-01 LAB — COMPREHENSIVE METABOLIC PANEL
ALT: 11 U/L — AB (ref 14–54)
ANION GAP: 5 (ref 5–15)
AST: 12 U/L — ABNORMAL LOW (ref 15–41)
Albumin: 2.9 g/dL — ABNORMAL LOW (ref 3.5–5.0)
Alkaline Phosphatase: 47 U/L (ref 38–126)
BUN: 15 mg/dL (ref 6–20)
CHLORIDE: 116 mmol/L — AB (ref 101–111)
CO2: 25 mmol/L (ref 22–32)
CREATININE: 0.53 mg/dL (ref 0.44–1.00)
Calcium: 10 mg/dL (ref 8.9–10.3)
GFR calc non Af Amer: >60 mL/min (ref >60)
Glucose, Bld: 82 mg/dL (ref 65–99)
POTASSIUM: 3.8 mmol/L (ref 3.5–5.1)
SODIUM: 146 mmol/L — AB (ref 135–145)
Total Bilirubin: 0.8 mg/dL (ref 0.3–1.2)
Total Protein: 7 g/dL (ref 6.5–8.1)

## 2015-06-01 LAB — CBC WITH DIFFERENTIAL/PLATELET
Basophils Absolute: 0 10*3/uL (ref 0.0–0.1)
Basophils Relative: 0 %
EOS ABS: 0.1 10*3/uL (ref 0.0–0.7)
EOS PCT: 1 %
HCT: 26.7 % — ABNORMAL LOW (ref 36.0–46.0)
Hemoglobin: 8.8 g/dL — ABNORMAL LOW (ref 12.0–15.0)
LYMPHS ABS: 3.7 10*3/uL (ref 0.7–4.0)
Lymphocytes Relative: 29 %
MCH: 28.8 pg (ref 26.0–34.0)
MCHC: 33 g/dL (ref 30.0–36.0)
MCV: 87.3 fL (ref 78.0–100.0)
Monocytes Absolute: 1.5 10*3/uL — ABNORMAL HIGH (ref 0.1–1.0)
Monocytes Relative: 12 %
Neutro Abs: 7.4 10*3/uL (ref 1.7–7.7)
Neutrophils Relative %: 58 %
PLATELETS: 393 10*3/uL (ref 150–400)
RBC: 3.06 MIL/uL — AB (ref 3.87–5.11)
RDW: 18.4 % — AB (ref 11.5–15.5)
WBC: 12.7 10*3/uL — AB (ref 4.0–10.5)

## 2015-06-01 LAB — PROTIME-INR
INR: 1.1 (ref 0.00–1.49)
Prothrombin Time: 14.4 seconds (ref 11.6–15.2)

## 2015-06-01 MED ORDER — HYDROCODONE-ACETAMINOPHEN 5-325 MG PO TABS: 1.0000 | ORAL_TABLET | Freq: Four times a day (QID) | ORAL | Status: DC | PRN

## 2015-06-01 MED ORDER — MORPHINE SULFATE (PF) 4 MG/ML IV SOLN
4.0000 mg | Freq: Once | INTRAVENOUS | Status: AC
  Administered 2015-06-01: 4 mg via INTRAVENOUS
  Filled 2015-06-01: qty 1

## 2015-06-01 NOTE — ED Notes (Signed)
MD at bedside. 

## 2015-06-01 NOTE — ED Notes (Signed)
Bed: WA20 Expected date:  Expected time:  Means of arrival:  Comments: Ems MS patient with leg pain swelling

## 2015-06-01 NOTE — ED Notes (Addendum)
Pt reports bilateral leg and feet swelling that gets better and worse for the past several weeks. Saw PCP for same 2 weeks ago who referred her to a doctor in [place]. Pt was unable to go to follow-up appointment and reports swelling has gotten worse. 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 Pt also complains of generalized body pain.

## 2015-06-01 NOTE — Discharge Instructions (Signed)
Chronic Pain  Chronic pain can be defined as pain that is off and on and lasts for 3-6 months or longer. Many things cause chronic pain, which can make it difficult to make a diagnosis. There are many treatment options available for chronic pain. However, finding a treatment that works well for you may require trying various approaches until the right one is found. Many people benefit from a combination of two or more types of treatment to control their pain.  SYMPTOMS   Chronic pain can occur anywhere in the body and can range from mild to very severe. Some types of chronic pain include:  · Headache.  · Low back pain.  · Cancer pain.  · Arthritis pain.  · Neurogenic pain. This is pain resulting from damage to nerves.   People with chronic pain may also have other symptoms such as:  · Depression.  · Anger.  · Insomnia.  · Anxiety.  DIAGNOSIS   Your health care provider will help diagnose your condition over time. In many cases, the initial focus will be on excluding possible conditions that could be causing the pain. Depending on your symptoms, your health care provider may order tests to diagnose your condition. Some of these tests may include:   · Blood tests.    · CT scan.    · MRI.    · X-rays.    · Ultrasounds.    · Nerve conduction studies.    You may need to see a specialist.   TREATMENT   Finding treatment that works well may take time. You may be referred to a pain specialist. He or she may prescribe medicine or therapies, such as:   · Mindful meditation or yoga.  · Shots (injections) of numbing or pain-relieving medicines into the spine or area of pain.  · Local electrical stimulation.  · Acupuncture.    · Massage therapy.    · Aroma, color, light, or sound therapy.    · Biofeedback.    · Working with a physical therapist to keep from getting stiff.    · Regular, gentle exercise.    · Cognitive or behavioral therapy.    · Group support.    Sometimes, surgery may be recommended.   HOME CARE INSTRUCTIONS    · Take all medicines as directed by your health care provider.    · Lessen stress in your life by relaxing and doing things such as listening to calming music.    · Exercise or be active as directed by your health care provider.    · Eat a healthy diet and include things such as vegetables, fruits, fish, and lean meats in your diet.    · Keep all follow-up appointments with your health care provider.    · Attend a support group with others suffering from chronic pain.  SEEK MEDICAL CARE IF:   · Your pain gets worse.    · You develop a new pain that was not there before.    · You cannot tolerate medicines given to you by your health care provider.    · You have new symptoms since your last visit with your health care provider.    SEEK IMMEDIATE MEDICAL CARE IF:   · You feel weak.    · You have decreased sensation or numbness.    · You lose control of bowel or bladder function.    · Your pain suddenly gets much worse.    · You develop shaking.  · You develop chills.  · You develop confusion.  · You develop chest pain.  · You develop shortness of breath.    MAKE SURE YOU:  ·   Document Revised: 01/30/2013 Document Reviewed: 11/23/2012 Elsevier Interactive Patient Education 2016 [employer]  Peripheral Edema - chronic leg swelling You have swelling in your legs (peripheral edema). This swelling is due to excess accumulation of salt and water in your body. Edema may be a sign of heart, kidney or liver disease, or a side effect of a medication. It may also be due to problems in the leg veins. Elevating your legs and using special support stockings may be very helpful, if the cause of  the swelling is due to poor venous circulation. Avoid long periods of standing, whatever the cause. Treatment of edema depends on identifying the cause. Chips, pretzels, pickles and other salty foods should be avoided. Restricting salt in your diet is almost always needed. Water pills (diuretics) are often used to remove the excess salt and water from your body via urine. These medicines prevent the kidney from reabsorbing sodium. This increases urine flow. Diuretic treatment may also result in lowering of potassium levels in your body. Potassium supplements may be needed if you have to use diuretics daily. Daily weights can help you keep track of your progress in clearing your edema. You should call your caregiver for follow up care as recommended. SEEK IMMEDIATE MEDICAL CARE IF:   You have increased swelling, pain, redness, or heat in your legs.  You develop shortness of breath, especially when lying down.  You develop chest or abdominal pain, weakness, or fainting.  You have a fever.   This information is not intended to replace advice given to you by your health care provider. Make sure you discuss any questions you have with your health care provider.   Document Released: 07/07/2004 Document Revised: 08/22/2011 Document Reviewed: 12/10/2014 Elsevier Interactive Patient Education [employer].

## 2015-06-01 NOTE — ED Provider Notes (Signed)
CSN: [id number]     Arrival date & time 06/01/15  1622 History   First MD Initiated Contact with Patient 06/01/15 1745     Chief Complaint  Patient presents with  . Leg Swelling     (Consider location/radiation/quality/duration/timing/severity/associated sxs/prior Treatment) HPI Comments: 54 y.o. 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  The patient reports that she has chronic pain that she cannot control at home on the gabapentin she is prescribed and that she has also had increased pain in her legs which have been swollen for a long time but which have been more swollen over the last month.  She reports that she has had chronic burning pain in her bilateral feet.  Denies chest pain, shortness of breath, palpitations, nausea, vomiting, diarrhea.  Reports most of her pain is just all over her body.   Past Medical History  Diagnosis Date  . MS (multiple sclerosis)   . Hyperglycemia   . Edema   . High blood pressure   . Diabetes mellitus without complication   . Pyelonephritis 12/16/2014   Past Surgical History  Procedure Laterality Date  . Hernia mesh removal    . Tubes tided    . Tubal ligation    . Esophagogastroduodenoscopy Left 01/02/2015    Procedure: ESOPHAGOGASTRODUODENOSCOPY (EGD);  Surgeon: Arta Silence, MD;  Location: [facility];  Service: Endoscopy;  Laterality: Left;   Family History  Problem Relation Age of Onset  . Diabetes Mother   . Diabetes Father   . Diabetes Sister   . Scoliosis Sister   . Alzheimer's disease Mother    Social History  Substance Use Topics  . Smoking status: Former Smoker -- 1.00 packs/day for 20 years    Types: Cigarettes    Quit date: 11/19/2014  . Smokeless tobacco: Never Used  . Alcohol Use: 3.0 oz/week    5 Glasses of wine per week   OB History    No data available     Review of Systems  Constitutional: Negative for  fever, chills and fatigue.  HENT: Negative for congestion, rhinorrhea, sinus pressure and sneezing.   Eyes: Negative for redness.  Respiratory: Negative for cough, chest tightness and shortness of breath.   Cardiovascular: Positive for leg swelling (chronic).  Gastrointestinal: Negative for nausea, abdominal pain and diarrhea.  Genitourinary: Negative for dysuria, urgency, frequency and hematuria.  Musculoskeletal: Positive for myalgias. Negative for back pain, arthralgias, neck pain and neck stiffness.  Skin: Negative for rash.  Neurological: Negative for dizziness, weakness and headaches.  Hematological: Bruises/bleeds easily (on xarelto).      Allergies  Review of patient's allergies indicates no known allergies.  Home Medications   Prior to Admission medications   Medication Sig Start Date End Date Taking? Authorizing Provider  albuterol (PROVENTIL HFA;VENTOLIN HFA) 108 (90 BASE) MCG/ACT inhaler Inhale 2 puffs into the lungs every 4 (four) hours as needed for wheezing or shortness of breath. Patient taking differently: Inhale 2 puffs into the lungs 2 (two) times daily as needed for wheezing or shortness of breath. PROAIR 06/29/12  Yes [name], MD  baclofen (LIORESAL) 10 MG tablet Take 10 mg by mouth 3 (three) times daily.    Yes Historical Provider, MD  furosemide (LASIX) 40 MG tablet Take 40 mg by mouth 3 (three) times daily.   Yes Historical Provider, MD  gabapentin (NEURONTIN) 100 MG capsule Take 100 mg by  mouth at bedtime.   Yes Historical Provider, MD  glipiZIDE (GLUCOTROL) 5 MG tablet Take 5 mg by mouth 2 (two) times daily.   Yes Historical Provider, MD  lisinopril (PRINIVIL,ZESTRIL) 2.5 MG tablet Take 2.5 mg by mouth daily.   Yes Historical Provider, MD  lovastatin (MEVACOR) 20 MG tablet Take 20 mg by mouth daily.   Yes Historical Provider, MD  metFORMIN (GLUCOPHAGE) 500 MG tablet Take 500 mg by mouth 2 (two) times daily. 12/08/14  Yes Historical Provider, MD  Omega 3  1000 MG CAPS Take 1,000 mg by mouth daily.   Yes Historical Provider, MD  potassium chloride (K-DUR) 10 MEQ tablet Take 10 mEq by mouth 2 (two) times daily.   Yes Historical Provider, MD  rivaroxaban (XARELTO) 20 MG TABS tablet Take 20 mg by mouth daily.   Yes Historical Provider, MD  solifenacin (VESICARE) 5 MG tablet Take 5 mg by mouth daily.   Yes Historical Provider, MD  vitamin C (ASCORBIC ACID) 500 MG tablet Take 500 mg by mouth daily.   Yes Historical Provider, MD  [place] 5-2.5-18.5 injection 03/23/15 03/23/15   Historical Provider, MD  iron polysaccharides (NIFEREX) 150 MG capsule Take 1 capsule (150 mg total) by mouth daily. Patient not taking: Reported on 06/01/2015 01/03/15   Nita Sells, MD  pantoprazole (PROTONIX) 40 MG tablet Take 1 tablet (40 mg total) by mouth daily. Patient not taking: Reported on 06/01/2015 01/03/15   Nita Sells, MD   BP 102/62 mmHg  Pulse 100  Temp(Src) 99.2 F (37.3 C) (Oral)  Resp 18  SpO2 94% Physical Exam  Constitutional: She is oriented to person, place, and time. No distress.  HENT:  Head: Normocephalic and atraumatic.  Mouth/Throat: Oropharynx is clear and moist.  Eyes: EOM are normal. Pupils are equal, round, and reactive to light.  Neck: Normal range of motion. Neck supple.  Cardiovascular: Normal rate and intact distal pulses.   Pulmonary/Chest: Effort normal. No respiratory distress. She has no wheezes. She has no rales.  Abdominal: Soft. She exhibits no distension. There is no tenderness.  Musculoskeletal: She exhibits edema (2+ bilateral lower extremity swelling which is symmetric.  No associated erythema.).  Neurological: She is alert and oriented to person, place, and time. No cranial nerve deficit.  Weak lower extremities bilaterally  Skin: She is not diaphoretic. No erythema.  Chronic appearing skin discoloration of the bilateral lower extremities  Vitals reviewed.   ED Course  Procedures (including critical  care time) Labs Review Labs Reviewed  CBC WITH DIFFERENTIAL/PLATELET - Abnormal; Notable for the following:    WBC 12.7 (*)    RBC 3.06 (*)    Hemoglobin 8.8 (*)    HCT 26.7 (*)    RDW 18.4 (*)    Monocytes Absolute 1.5 (*)    All other components within normal limits  COMPREHENSIVE METABOLIC PANEL - Abnormal; Notable for the following:    Sodium 146 (*)    Chloride 116 (*)    Albumin 2.9 (*)    AST 12 (*)    ALT 11 (*)    All other components within normal limits  PROTIME-INR    Imaging Review No results found. I have personally reviewed and evaluated these images and lab results as part of my medical decision-making.   EKG Interpretation None      MDM  Patient was seen and evaluated in stable condition.  Patient reports chronic pain that is uncontrolled and chronic leg swelling as the reasons for her visit.  Non toxic appearing.  Reports that she would like to stay overnight because she hurts all over and her doctor is out of the country until January 5.  No acute findings on blood work.  Legs appear chronically swollen and patient is already on Xarelto.  Will order outpatient doppler studies to evaluate for worsening DVT.  Patient instructed to keep compliant with her Xarelto.  No sign if infection.  Patient was discharged home in stable condition with instruction to follow up outpatient.  She was provided a prescription for a short course of pain medication. Final diagnoses:  None    1. Chronic generalized pain  2. Chronic leg swelling, bilaterally    Harvel Quale, MD 06/01/15 2155

## 2015-06-02 ENCOUNTER — Ambulatory Visit (HOSPITAL_COMMUNITY): Admission: RE | Admit: 2015-06-02 | Payer: Medicare Other | Source: Ambulatory Visit

## 2015-06-04 ENCOUNTER — Ambulatory Visit (HOSPITAL_COMMUNITY): Payer: Medicare Other

## 2015-07-05 ENCOUNTER — Encounter (HOSPITAL_COMMUNITY): Payer: Self-pay | Admitting: Nurse Practitioner

## 2015-07-05 ENCOUNTER — Inpatient Hospital Stay (HOSPITAL_COMMUNITY): Payer: Medicare Other

## 2015-07-05 ENCOUNTER — Inpatient Hospital Stay (HOSPITAL_COMMUNITY)
Admission: EM | Admit: 2015-07-05 | Discharge: 2015-07-09 | DRG: 592 | Disposition: A | Discharge status: Skilled Nursing Facility | Payer: Medicare Other | Attending: Internal Medicine | Admitting: Internal Medicine

## 2015-07-05 DIAGNOSIS — I10 Essential (primary) hypertension: Secondary | ICD-10-CM | POA: Diagnosis present

## 2015-07-05 DIAGNOSIS — Z87891 Personal history of nicotine dependence: Secondary | ICD-10-CM | POA: Diagnosis not present

## 2015-07-05 DIAGNOSIS — Z833 Family history of diabetes mellitus: Secondary | ICD-10-CM

## 2015-07-05 DIAGNOSIS — E119 Type 2 diabetes mellitus without complications: Secondary | ICD-10-CM | POA: Diagnosis present

## 2015-07-05 DIAGNOSIS — Z87442 Personal history of urinary calculi: Secondary | ICD-10-CM | POA: Diagnosis not present

## 2015-07-05 DIAGNOSIS — L89159 Pressure ulcer of sacral region, unspecified stage: Secondary | ICD-10-CM | POA: Diagnosis not present

## 2015-07-05 DIAGNOSIS — Z86718 Personal history of other venous thrombosis and embolism: Secondary | ICD-10-CM

## 2015-07-05 DIAGNOSIS — L8994 Pressure ulcer of unspecified site, stage 4: Secondary | ICD-10-CM | POA: Diagnosis not present

## 2015-07-05 DIAGNOSIS — Z7901 Long term (current) use of anticoagulants: Secondary | ICD-10-CM

## 2015-07-05 DIAGNOSIS — Z7984 Long term (current) use of oral hypoglycemic drugs: Secondary | ICD-10-CM | POA: Diagnosis not present

## 2015-07-05 DIAGNOSIS — G35 Multiple sclerosis: Secondary | ICD-10-CM

## 2015-07-05 DIAGNOSIS — Z7401 Bed confinement status: Secondary | ICD-10-CM | POA: Diagnosis not present

## 2015-07-05 DIAGNOSIS — N390 Urinary tract infection, site not specified: Secondary | ICD-10-CM | POA: Diagnosis present

## 2015-07-05 DIAGNOSIS — Z820 Family history of epilepsy and other diseases of the nervous system: Secondary | ICD-10-CM | POA: Diagnosis not present

## 2015-07-05 DIAGNOSIS — L89212 Pressure ulcer of right hip, stage 2: Secondary | ICD-10-CM | POA: Diagnosis present

## 2015-07-05 DIAGNOSIS — I82409 Acute embolism and thrombosis of unspecified deep veins of unspecified lower extremity: Secondary | ICD-10-CM | POA: Diagnosis not present

## 2015-07-05 DIAGNOSIS — L8990 Pressure ulcer of unspecified site, unspecified stage: Secondary | ICD-10-CM | POA: Diagnosis not present

## 2015-07-05 DIAGNOSIS — D649 Anemia, unspecified: Secondary | ICD-10-CM | POA: Diagnosis present

## 2015-07-05 DIAGNOSIS — Z79899 Other long term (current) drug therapy: Secondary | ICD-10-CM

## 2015-07-05 DIAGNOSIS — D638 Anemia in other chronic diseases classified elsewhere: Secondary | ICD-10-CM | POA: Diagnosis present

## 2015-07-05 DIAGNOSIS — L89154 Pressure ulcer of sacral region, stage 4: Principal | ICD-10-CM | POA: Diagnosis present

## 2015-07-05 DIAGNOSIS — E1122 Type 2 diabetes mellitus with diabetic chronic kidney disease: Secondary | ICD-10-CM | POA: Diagnosis not present

## 2015-07-05 DIAGNOSIS — G8220 Paraplegia, unspecified: Secondary | ICD-10-CM | POA: Diagnosis present

## 2015-07-05 HISTORY — DX: Pressure ulcer of unspecified site, stage 4: L89.94

## 2015-07-05 LAB — URINALYSIS, ROUTINE W REFLEX MICROSCOPIC
Bilirubin Urine: NEGATIVE
Glucose, UA: NEGATIVE mg/dL
Ketones, ur: NEGATIVE mg/dL
Nitrite: POSITIVE — AB
Protein, ur: 30 mg/dL — AB
Specific Gravity, Urine: 1.017 (ref 1.005–1.030)
pH: 8.5 — ABNORMAL HIGH (ref 5.0–8.0)

## 2015-07-05 LAB — COMPREHENSIVE METABOLIC PANEL
ALBUMIN: 2.9 g/dL — AB (ref 3.5–5.0)
ALT: 10 U/L — ABNORMAL LOW (ref 14–54)
ANION GAP: 7 (ref 5–15)
AST: 12 U/L — AB (ref 15–41)
Alkaline Phosphatase: 52 U/L (ref 38–126)
BUN: 21 mg/dL — AB (ref 6–20)
CHLORIDE: 112 mmol/L — AB (ref 101–111)
CO2: 25 mmol/L (ref 22–32)
Calcium: 10.3 mg/dL (ref 8.9–10.3)
Creatinine, Ser: 0.65 mg/dL (ref 0.44–1.00)
GFR calc Af Amer: >60 mL/min (ref >60)
GLUCOSE: 168 mg/dL — AB (ref 65–99)
POTASSIUM: 4.1 mmol/L (ref 3.5–5.1)
SODIUM: 144 mmol/L (ref 135–145)
Total Bilirubin: 0.5 mg/dL (ref 0.3–1.2)
Total Protein: 7.5 g/dL (ref 6.5–8.1)

## 2015-07-05 LAB — URINE MICROSCOPIC-ADD ON

## 2015-07-05 LAB — CBC WITH DIFFERENTIAL/PLATELET
Basophils Absolute: 0.1 K/uL (ref 0.0–0.1)
Basophils Relative: 0 %
Eosinophils Absolute: 0.1 K/uL (ref 0.0–0.7)
Eosinophils Relative: 1 %
HCT: 29.1 % — ABNORMAL LOW (ref 36.0–46.0)
Hemoglobin: 9.1 g/dL — ABNORMAL LOW (ref 12.0–15.0)
Lymphocytes Relative: 30 %
Lymphs Abs: 3.5 K/uL (ref 0.7–4.0)
MCH: 27.7 pg (ref 26.0–34.0)
MCHC: 31.3 g/dL (ref 30.0–36.0)
MCV: 88.7 fL (ref 78.0–100.0)
Monocytes Absolute: 1.1 K/uL — ABNORMAL HIGH (ref 0.1–1.0)
Monocytes Relative: 10 %
Neutro Abs: 6.6 K/uL (ref 1.7–7.7)
Neutrophils Relative %: 59 %
Platelets: 481 K/uL — ABNORMAL HIGH (ref 150–400)
RBC: 3.28 MIL/uL — ABNORMAL LOW (ref 3.87–5.11)
RDW: 17.2 % — ABNORMAL HIGH (ref 11.5–15.5)
WBC: 11.4 K/uL — ABNORMAL HIGH (ref 4.0–10.5)

## 2015-07-05 LAB — GLUCOSE, CAPILLARY: Glucose-Capillary: 156 mg/dL — ABNORMAL HIGH (ref 65–99)

## 2015-07-05 LAB — PROTIME-INR
INR: 1.14 (ref 0.00–1.49)
PROTHROMBIN TIME: 14.8 s (ref 11.6–15.2)

## 2015-07-05 LAB — APTT: aPTT: 32 seconds (ref 24–37)

## 2015-07-05 LAB — I-STAT CG4 LACTIC ACID, ED: Lactic Acid, Venous: 0.76 mmol/L (ref 0.5–2.0)

## 2015-07-05 MED ORDER — OMEGA-3-ACID ETHYL ESTERS 1 G PO CAPS
1000.0000 mg | ORAL_CAPSULE | Freq: Every day | ORAL | Status: DC
  Administered 2015-07-06 – 2015-07-09 (×4): 1000 mg via ORAL
  Filled 2015-07-05 (×4): qty 1

## 2015-07-05 MED ORDER — VANCOMYCIN HCL IN DEXTROSE 750-5 MG/150ML-% IV SOLN
750.0000 mg | Freq: Two times a day (BID) | INTRAVENOUS | Status: DC
  Administered 2015-07-06 – 2015-07-09 (×7): 750 mg via INTRAVENOUS
  Filled 2015-07-05 (×8): qty 150

## 2015-07-05 MED ORDER — ACETAMINOPHEN 650 MG RE SUPP: 650.0000 mg | Freq: Four times a day (QID) | RECTAL | Status: DC | PRN

## 2015-07-05 MED ORDER — VANCOMYCIN HCL IN DEXTROSE 1-5 GM/200ML-% IV SOLN
1000.0000 mg | Freq: Once | INTRAVENOUS | Status: AC
  Administered 2015-07-05: 1000 mg via INTRAVENOUS
  Filled 2015-07-05: qty 200

## 2015-07-05 MED ORDER — INSULIN ASPART 100 UNIT/ML ~~LOC~~ SOLN
0.0000 [IU] | Freq: Three times a day (TID) | SUBCUTANEOUS | Status: DC
  Administered 2015-07-06: 2 [IU] via SUBCUTANEOUS
  Administered 2015-07-06: 1 [IU] via SUBCUTANEOUS
  Administered 2015-07-07 – 2015-07-08 (×2): 2 [IU] via SUBCUTANEOUS
  Administered 2015-07-08: 1 [IU] via SUBCUTANEOUS
  Administered 2015-07-09: 2 [IU] via SUBCUTANEOUS

## 2015-07-05 MED ORDER — ACETAMINOPHEN 325 MG PO TABS: 650.0000 mg | ORAL_TABLET | Freq: Four times a day (QID) | ORAL | Status: DC | PRN

## 2015-07-05 MED ORDER — VITAMIN C 500 MG PO TABS
500.0000 mg | ORAL_TABLET | Freq: Every day | ORAL | Status: DC
  Administered 2015-07-06 – 2015-07-09 (×4): 500 mg via ORAL
  Filled 2015-07-05 (×4): qty 1

## 2015-07-05 MED ORDER — ALBUTEROL SULFATE (2.5 MG/3ML) 0.083% IN NEBU: 3.0000 mL | INHALATION_SOLUTION | Freq: Two times a day (BID) | RESPIRATORY_TRACT | Status: DC | PRN

## 2015-07-05 MED ORDER — PRAVASTATIN SODIUM 20 MG PO TABS
20.0000 mg | ORAL_TABLET | Freq: Every day | ORAL | Status: DC
  Administered 2015-07-06 – 2015-07-08 (×3): 20 mg via ORAL
  Filled 2015-07-05 (×3): qty 1

## 2015-07-05 MED ORDER — POTASSIUM CHLORIDE ER 10 MEQ PO TBCR
10.0000 meq | EXTENDED_RELEASE_TABLET | Freq: Two times a day (BID) | ORAL | Status: DC
  Administered 2015-07-05: 10 meq via ORAL
  Filled 2015-07-05 (×3): qty 1

## 2015-07-05 MED ORDER — ONDANSETRON HCL 4 MG PO TABS
4.0000 mg | ORAL_TABLET | Freq: Four times a day (QID) | ORAL | Status: DC | PRN
  Administered 2015-07-08: 4 mg via ORAL
  Filled 2015-07-05: qty 1

## 2015-07-05 MED ORDER — MORPHINE SULFATE (PF) 2 MG/ML IV SOLN: 1.0000 mg | INTRAVENOUS | Status: DC | PRN

## 2015-07-05 MED ORDER — FUROSEMIDE 40 MG PO TABS
40.0000 mg | ORAL_TABLET | Freq: Three times a day (TID) | ORAL | Status: DC
  Administered 2015-07-05 – 2015-07-06 (×2): 40 mg via ORAL
  Filled 2015-07-05 (×2): qty 1

## 2015-07-05 MED ORDER — SODIUM CHLORIDE 0.9 % IV BOLUS (SEPSIS)
1000.0000 mL | Freq: Once | INTRAVENOUS | Status: AC
  Administered 2015-07-05: 1000 mL via INTRAVENOUS

## 2015-07-05 MED ORDER — PIPERACILLIN-TAZOBACTAM 3.375 G IVPB 30 MIN
3.3750 g | Freq: Once | INTRAVENOUS | Status: AC
  Administered 2015-07-05: 3.375 g via INTRAVENOUS
  Filled 2015-07-05: qty 50

## 2015-07-05 MED ORDER — ONDANSETRON HCL 4 MG/2ML IJ SOLN: 4.0000 mg | Freq: Four times a day (QID) | INTRAMUSCULAR | Status: DC | PRN

## 2015-07-05 MED ORDER — GLIPIZIDE 10 MG PO TABS
5.0000 mg | ORAL_TABLET | Freq: Two times a day (BID) | ORAL | Status: DC
  Administered 2015-07-06 – 2015-07-09 (×6): 5 mg via ORAL
  Filled 2015-07-05 (×6): qty 1

## 2015-07-05 MED ORDER — HYDROCODONE-ACETAMINOPHEN 5-325 MG PO TABS
1.0000 | ORAL_TABLET | Freq: Four times a day (QID) | ORAL | Status: DC | PRN
  Filled 2015-07-05: qty 1

## 2015-07-05 MED ORDER — PIPERACILLIN-TAZOBACTAM 3.375 G IVPB
3.3750 g | Freq: Three times a day (TID) | INTRAVENOUS | Status: DC
  Administered 2015-07-06 – 2015-07-09 (×11): 3.375 g via INTRAVENOUS
  Filled 2015-07-05 (×9): qty 50

## 2015-07-05 MED ORDER — IOHEXOL 300 MG/ML  SOLN
100.0000 mL | Freq: Once | INTRAMUSCULAR | Status: AC | PRN
  Administered 2015-07-05: 100 mL via INTRAVENOUS

## 2015-07-05 MED ORDER — SODIUM CHLORIDE 0.9 % IJ SOLN
3.0000 mL | Freq: Two times a day (BID) | INTRAMUSCULAR | Status: DC
  Administered 2015-07-05 – 2015-07-08 (×4): 3 mL via INTRAVENOUS

## 2015-07-05 MED ORDER — BACLOFEN 10 MG PO TABS
10.0000 mg | ORAL_TABLET | Freq: Three times a day (TID) | ORAL | Status: DC
  Administered 2015-07-05 – 2015-07-09 (×11): 10 mg via ORAL
  Filled 2015-07-05 (×11): qty 1

## 2015-07-05 MED ORDER — LISINOPRIL 2.5 MG PO TABS
2.5000 mg | ORAL_TABLET | Freq: Every day | ORAL | Status: DC
  Filled 2015-07-05: qty 1

## 2015-07-05 MED ORDER — DARIFENACIN HYDROBROMIDE ER 7.5 MG PO TB24
7.5000 mg | ORAL_TABLET | Freq: Every day | ORAL | Status: DC
  Administered 2015-07-06 – 2015-07-09 (×4): 7.5 mg via ORAL
  Filled 2015-07-05 (×4): qty 1

## 2015-07-05 MED ORDER — GABAPENTIN 100 MG PO CAPS
100.0000 mg | ORAL_CAPSULE | Freq: Every day | ORAL | Status: DC
  Administered 2015-07-05 – 2015-07-08 (×4): 100 mg via ORAL
  Filled 2015-07-05 (×4): qty 1

## 2015-07-05 NOTE — ED Provider Notes (Signed)
CSN: [id number]     Arrival date & time 07/05/15  1706 History   First MD Initiated Contact with Patient 07/05/15 1711     Chief Complaint  Patient presents with  . Wounds Infection      (Consider location/radiation/quality/duration/timing/severity/associated sxs/prior Treatment) HPI   Patient is a 55 year old female with history of multiple sclerosis, edema, high blood pressure, diabetes presenting today with new wounds. Patient is relatively immobile at home. She is cared for by an ex-boyfriend. She states that he's not been caring for her very well recently. She reports that he was changing her and found a hole in her back. She says that she smelled malodorous so she came here to be evaluated.  Patient denies any fever nausea vomiting or diarrhea.  Past Medical History  Diagnosis Date  . MS (multiple sclerosis) ([place])   . Hyperglycemia   . Edema   . High blood pressure   . Diabetes mellitus without complication ([place])   . Pyelonephritis 12/16/2014   Past Surgical History  Procedure Laterality Date  . Hernia mesh removal    . Tubes tided    . Tubal ligation    . Esophagogastroduodenoscopy Left 01/02/2015    Procedure: ESOPHAGOGASTRODUODENOSCOPY (EGD);  Surgeon: Arta Silence, MD;  Location: [facility];  Service: Endoscopy;  Laterality: Left;   Family History  Problem Relation Age of Onset  . Diabetes Mother   . Diabetes Father   . Diabetes Sister   . Scoliosis Sister   . Alzheimer's disease Mother    Social History  Substance Use Topics  . Smoking status: Former Smoker -- 1.00 packs/day for 20 years    Types: Cigarettes    Quit date: 11/19/2014  . Smokeless tobacco: Never Used  . Alcohol Use: 3.0 oz/week    5 Glasses of wine per week   OB History    No data available     Review of Systems  Constitutional: Negative for fever and activity change.  Respiratory: Negative for shortness of breath.   Cardiovascular: Negative for chest pain.  Gastrointestinal:  Negative for abdominal pain.      Allergies  Review of patient's allergies indicates no known allergies.  Home Medications   Prior to Admission medications   Medication Sig Start Date End Date Taking? Authorizing Provider  baclofen (LIORESAL) 10 MG tablet Take 10 mg by mouth 3 (three) times daily.    Yes Historical Provider, MD  furosemide (LASIX) 40 MG tablet Take 40 mg by mouth 3 (three) times daily. Reported on 07/05/2015   Yes Historical Provider, MD  gabapentin (NEURONTIN) 100 MG capsule Take 100 mg by mouth at bedtime.   Yes Historical Provider, MD  glipiZIDE (GLUCOTROL) 5 MG tablet Take 5 mg by mouth 2 (two) times daily.   Yes Historical Provider, MD  HYDROcodone-acetaminophen (NORCO) 5-325 MG tablet Take 1 tablet by mouth every 6 (six) hours as needed for moderate pain. 06/01/15  Yes Harvel Quale, MD  lisinopril (PRINIVIL,ZESTRIL) 2.5 MG tablet Take 2.5 mg by mouth daily.   Yes Historical Provider, MD  lovastatin (MEVACOR) 20 MG tablet Take 20 mg by mouth daily.   Yes Historical Provider, MD  metFORMIN (GLUCOPHAGE) 500 MG tablet Take 500 mg by mouth 2 (two) times daily. 12/08/14  Yes Historical Provider, MD  Omega 3 1000 MG CAPS Take 1,000 mg by mouth daily.   Yes Historical Provider, MD  potassium chloride (K-DUR) 10 MEQ tablet Take 10 mEq by mouth 2 (two) times daily.  Yes Historical Provider, MD  solifenacin (VESICARE) 5 MG tablet Take 5 mg by mouth daily.   Yes Historical Provider, MD  vitamin C (ASCORBIC ACID) 500 MG tablet Take 500 mg by mouth daily.   Yes Historical Provider, MD  albuterol (PROVENTIL HFA;VENTOLIN HFA) 108 (90 BASE) MCG/ACT inhaler Inhale 2 puffs into the lungs every 4 (four) hours as needed for wheezing or shortness of breath. Patient taking differently: Inhale 2 puffs into the lungs 2 (two) times daily as needed for wheezing or shortness of breath. PROAIR 06/29/12   [name], MD  iron polysaccharides (NIFEREX) 150 MG capsule Take 1 capsule (150 mg  total) by mouth daily. Patient not taking: Reported on 06/01/2015 01/03/15   Nita Sells, MD  pantoprazole (PROTONIX) 40 MG tablet Take 1 tablet (40 mg total) by mouth daily. Patient not taking: Reported on 06/01/2015 01/03/15   Nita Sells, MD  rivaroxaban (XARELTO) 20 MG TABS tablet Take 20 mg by mouth daily.    Historical Provider, MD   BP 124/67 mmHg  Pulse 103  Temp(Src) 98.7 F (37.1 C) (Oral)  Resp 18  SpO2 95% Physical Exam  Constitutional: She is oriented to person, place, and time. She appears well-developed and well-nourished.  HENT:  Head: Normocephalic and atraumatic.  Eyes: Conjunctivae are normal. Right eye exhibits no discharge.  Neck: Neck supple.  Cardiovascular: Normal rate, regular rhythm and normal heart sounds.   No murmur heard. Pulmonary/Chest: Effort normal and breath sounds normal. She has no wheezes. She has no rales.  Abdominal: Soft. She exhibits no distension. There is no tenderness.  Musculoskeletal: Normal range of motion. She exhibits edema.  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  Neurological: She is oriented to person, place, and time. No cranial nerve deficit.  Skin: Skin is warm and dry. No rash noted. She is not diaphoretic.  Patient has 2 stage IV ulcers with frank purulence and malodorous located on sacrum and right gluteus.  Additional stage III ulcer located on left gluteal    Psychiatric: She has a normal mood and affect. Her behavior is normal.  Nursing note and vitals reviewed.   ED Course  Procedures (including critical care time) Labs Review Labs Reviewed  URINE CULTURE  CBC WITH DIFFERENTIAL/PLATELET  COMPREHENSIVE METABOLIC PANEL  URINALYSIS, ROUTINE W REFLEX MICROSCOPIC (NOT AT [facility])  I-STAT CG4 LACTIC ACID, ED    Imaging Review No results found. I have personally reviewed and evaluated these images and lab results as part of my  medical decision-making.   EKG Interpretation None      MDM   Final diagnoses:  None    Patient is a 55 year old female presenting with new stage IV ulcers. Patient ulcers are infected,frankly purulent and malodorous.patient has mild tachycardia, we will initiate IV antibiotics and wound care.      Akirra Lacerda Julio Alm, MD 07/05/15 [id number]

## 2015-07-05 NOTE — Progress Notes (Signed)
ANTICOAGULATION CONSULT NOTE - Initial Consult  Pharmacy Consult for IV Heparin Indication: Hx of DVT on Xarelto PTA  No Known Allergies  Patient Measurements: Height: 5\' 6"  (167.6 cm) Weight: 143 lb 1.3 oz (64.9 kg) IBW/kg (Calculated) : 59.3 Heparin Dosing Weight: 64.9 kg  Vital Signs: Temp: 98.7 F (37.1 C) (01/22 2222) Temp Source: Oral (01/22 2222) BP: 133/56 mmHg (01/22 2222) Pulse Rate: 95 (01/22 2222)  Labs:  Recent Labs  07/05/15 1750  HGB 9.1*  HCT 29.1*  PLT 481*  CREATININE 0.65    Estimated Creatinine Clearance: 73.5 mL/min (by C-G formula based on Cr of 0.65).   Medical History: Past Medical History  Diagnosis Date  . MS (multiple sclerosis) ([place])   . Hyperglycemia   . Edema   . High blood pressure   . Diabetes mellitus without complication ([place])   . Pyelonephritis 12/16/2014    Medications:  Scheduled:  . baclofen  10 mg Oral TID  . [START ON 07/06/2015] darifenacin  7.5 mg Oral Daily  . furosemide  40 mg Oral 3 times per day  . gabapentin  100 mg Oral QHS  . glipiZIDE  5 mg Oral BID  . [START ON 07/06/2015] insulin aspart  0-9 Units Subcutaneous TID WC  . [START ON 07/06/2015] lisinopril  2.5 mg Oral Daily  . [START ON 07/06/2015] omega-3 acid ethyl esters  1,000 mg Oral Daily  . [START ON 07/06/2015] piperacillin-tazobactam (ZOSYN)  IV  3.375 g Intravenous Q8H  . potassium chloride  10 mEq Oral BID  . [START ON 07/06/2015] pravastatin  20 mg Oral q1800  . sodium chloride  3 mL Intravenous Q12H  . [START ON 07/06/2015] vancomycin  750 mg Intravenous Q12H  . [START ON 07/06/2015] vitamin C  500 mg Oral Daily   Infusions:    Assessment: 65 yoF admitted for wounds evaluation found to have stage IV decubitus ulcers with malordorous drainage.  Pt on xarelto PTA for hx of LE DVT LD 1/20 am.  IV Heparin per Rx. 1/22 Baseline coags:  INR=1.14, aPtt=32 and HL=<0.10  Goal of Therapy:  Heparin level 0.3-0.7 units/ml Monitor platelets by  anticoagulation protocol: Yes   Plan:   Baseline aptt/PT/INR/HL stat  Heparin 3000 unit bolus x1 start drip at 1100 units/hr (11 ml/hr)  Daily CBC/HL  Check 1st HL 6 hours after drip started   Dorrene German 07/05/2015,11:05 PM

## 2015-07-05 NOTE — Progress Notes (Signed)
ANTIBIOTIC CONSULT NOTE - INITIAL  Pharmacy Consult for vancomycin, Zosyn Indication: wound infections  No Known Allergies  Patient Measurements: Weight: 147 lb 11.3 oz (67 kg)  Vital Signs: Temp: 98.7 F (37.1 C) (01/22 1743) Temp Source: Oral (01/22 1743) BP: 124/67 mmHg (01/22 1743) Pulse Rate: 103 (01/22 1743) Intake/Output from previous day:   Intake/Output from this shift:    Labs:  Recent Labs  07/05/15 1750  WBC 11.4*  HGB 9.1*  PLT 481*  CREATININE 0.65   Estimated Creatinine Clearance: 70.7 mL/min (by C-G formula based on Cr of 0.65). No results for input(s): VANCOTROUGH, VANCOPEAK, VANCORANDOM, GENTTROUGH, GENTPEAK, GENTRANDOM, TOBRATROUGH, TOBRAPEAK, TOBRARND, AMIKACINPEAK, AMIKACINTROU, AMIKACIN in the last 72 hours.   Microbiology: No results found for this or any previous visit (from the past 720 hour(s)).  Medical History: Past Medical History  Diagnosis Date  . MS (multiple sclerosis) ([place])   . Hyperglycemia   . Edema   . High blood pressure   . Diabetes mellitus without complication ([place])   . Pyelonephritis 12/16/2014    Medications:  Scheduled:   Infusions:  . piperacillin-tazobactam    . sodium chloride    . vancomycin     Assessment: 55 yo female with hx DM presents for wounds evaluation. Patient has unstaged decubitus ulcers with malodorous drainage. To start vancomycin and Zosyn per pharmacy dosing. Afeb, WBC 11.4 and SCr 0.65 with CrCl 71 at baseline  Goal of Therapy:  Vancomycin trough level 15-20 mcg/ml  Plan:  1) Vanc 1g x 1 then 750mg  q12 based on current weight and renal function 2) Zosyn 3.375g IV q8 (extended interval infusion) for CrCl > 20 ml/min   Adrian Saran, PharmD, BCPS Pager [phone] 07/05/2015 6:32 PM

## 2015-07-05 NOTE — H&P (Addendum)
[facility] History and Physical  [name] [id number] DOB: [birth year] DOA: 07/05/2015  Referring physician: Dr. Thomasene Lot. PCP: [name], MD  Specialists: None.  Chief Complaint: Foul odor from the sacral decubitus.  HPI: [name] is a 55 y.o. female with history of multiple sclerosis no longer on treatment with sacral decubitus was brought to the ER the patient found that patient was having found odor from the sacral decubitus. Patient has at least 3 ulcers stage IV with some slough on the sacral area. CT scan does not show any definite bony involvement. Patient at this time is being admitted for further management of her infected sacral decubitus ulcer. Patient also probably will need placement since patient has not much help at home. Patient is a poor historian. Patient states she may have missed some of her medications. Patient otherwise denies any nausea vomiting abdominal pain or diarrhea. Is usually bed bound bilateral lower extremity weakness.   Review of Systems: As presented in the history of presenting illness, rest negative.  Past Medical History  Diagnosis Date  . MS (multiple sclerosis) ([place])   . Hyperglycemia   . Edema   . High blood pressure   . Diabetes mellitus without complication ([place])   . Pyelonephritis 12/16/2014   Past Surgical History  Procedure Laterality Date  . Hernia mesh removal    . Tubes tided    . Tubal ligation    . Esophagogastroduodenoscopy Left 01/02/2015    Procedure: ESOPHAGOGASTRODUODENOSCOPY (EGD);  Surgeon: Arta Silence, MD;  Location: [facility];  Service: Endoscopy;  Laterality: Left;   Social History:  reports that she quit smoking about 7 months ago. Her smoking use included Cigarettes. She has a 20 pack-year smoking history. She has never used smokeless tobacco. She reports that she drinks about 3.0 oz of alcohol per week. She reports that she does not use illicit drugs. Where does patient live home. Can  patient participate in ADLs? Yes.  No Known Allergies  Family History:  Family History  Problem Relation Age of Onset  . Diabetes Mother   . Diabetes Father   . Diabetes Sister   . Scoliosis Sister   . Alzheimer's disease Mother       Prior to Admission medications   Medication Sig Start Date End Date Taking? Authorizing Provider  baclofen (LIORESAL) 10 MG tablet Take 10 mg by mouth 3 (three) times daily.    Yes Historical Provider, MD  furosemide (LASIX) 40 MG tablet Take 40 mg by mouth 3 (three) times daily. Reported on 07/05/2015   Yes Historical Provider, MD  gabapentin (NEURONTIN) 100 MG capsule Take 100 mg by mouth at bedtime.   Yes Historical Provider, MD  glipiZIDE (GLUCOTROL) 5 MG tablet Take 5 mg by mouth 2 (two) times daily.   Yes Historical Provider, MD  HYDROcodone-acetaminophen (NORCO) 5-325 MG tablet Take 1 tablet by mouth every 6 (six) hours as needed for moderate pain. 06/01/15  Yes Harvel Quale, MD  lisinopril (PRINIVIL,ZESTRIL) 2.5 MG tablet Take 2.5 mg by mouth daily.   Yes Historical Provider, MD  lovastatin (MEVACOR) 20 MG tablet Take 20 mg by mouth daily.   Yes Historical Provider, MD  metFORMIN (GLUCOPHAGE) 500 MG tablet Take 500 mg by mouth 2 (two) times daily. 12/08/14  Yes Historical Provider, MD  Omega 3 1000 MG CAPS Take 1,000 mg by mouth daily.   Yes Historical Provider, MD  potassium chloride (K-DUR) 10 MEQ tablet Take 10 mEq by mouth 2 (two) times  daily.   Yes Historical Provider, MD  solifenacin (VESICARE) 5 MG tablet Take 5 mg by mouth daily.   Yes Historical Provider, MD  vitamin C (ASCORBIC ACID) 500 MG tablet Take 500 mg by mouth daily.   Yes Historical Provider, MD  albuterol (PROVENTIL HFA;VENTOLIN HFA) 108 (90 BASE) MCG/ACT inhaler Inhale 2 puffs into the lungs every 4 (four) hours as needed for wheezing or shortness of breath. Patient taking differently: Inhale 2 puffs into the lungs 2 (two) times daily as needed for wheezing or shortness of  breath. PROAIR 06/29/12   [name], MD  iron polysaccharides (NIFEREX) 150 MG capsule Take 1 capsule (150 mg total) by mouth daily. Patient not taking: Reported on 06/01/2015 01/03/15   Nita Sells, MD  pantoprazole (PROTONIX) 40 MG tablet Take 1 tablet (40 mg total) by mouth daily. Patient not taking: Reported on 06/01/2015 01/03/15   Nita Sells, MD  rivaroxaban (XARELTO) 20 MG TABS tablet Take 20 mg by mouth daily.    Historical Provider, MD    Physical Exam: Filed Vitals:   07/05/15 1743 07/05/15 1800 07/05/15 1930 07/05/15 2222  BP: 124/67  139/80 133/56  Pulse: 103  108 95  Temp: 98.7 F (37.1 C)   98.7 F (37.1 C)  TempSrc: Oral   Oral  Resp: 18  18 16   Height:    5\' 6"  (1.676 m)  Weight:  67 kg (147 lb 11.3 oz)  64.9 kg (143 lb 1.3 oz)  SpO2: 95%  97% 100%     General:  Moderately built and nourished.  Eyes: Anicteric no pallor.  ENT: No discharge from the ears eyes nose or mouth.  Neck: No mass felt.  Cardiovascular: S1 and S2 heard.  Respiratory: No rhonchi or crepitations.  Abdomen: Soft nontender bowel sounds present.  Skin: 1 cycle and to buttock ulcers measuring around 1-1/2 cm with some slough. Stage IV.  Musculoskeletal: Bilateral lower extremity edema which appears chronic.  Psychiatric: Appears normal.  Neurologic: Alert awake oriented to place and person and unable to move lower extremities and right upper extremity is also weak.  Labs on Admission:  Basic Metabolic Panel:  Recent Labs Lab 07/05/15 1750  NA 144  K 4.1  CL 112*  CO2 25  GLUCOSE 168*  BUN 21*  CREATININE 0.65  CALCIUM 10.3   Liver Function Tests:  Recent Labs Lab 07/05/15 1750  AST 12*  ALT 10*  ALKPHOS 52  BILITOT 0.5  PROT 7.5  ALBUMIN 2.9*   No results for input(s): LIPASE, AMYLASE in the last 168 hours. No results for input(s): AMMONIA in the last 168 hours. CBC:  Recent Labs Lab 07/05/15 1750  WBC 11.4*  NEUTROABS 6.6  HGB 9.1*   HCT 29.1*  MCV 88.7  PLT 481*   Cardiac Enzymes: No results for input(s): CKTOTAL, CKMB, CKMBINDEX, TROPONINI in the last 168 hours.  BNP (last 3 results)  Recent Labs  12/13/14 1620  BNP 56.9    ProBNP (last 3 results) No results for input(s): PROBNP in the last 8760 hours.  CBG: No results for input(s): GLUCAP in the last 168 hours.  Radiological Exams on Admission: Ct Pelvis W Contrast  07/05/2015  CLINICAL DATA:  New decubitus wound, current history of multiple sclerosis, diabetes, immobility EXAM: CT PELVIS WITH CONTRAST TECHNIQUE: Multidetector CT imaging of the pelvis was performed using the standard protocol following the bolus administration of intravenous contrast. CONTRAST:  180mL OMNIPAQUE IOHEXOL 300 MG/ML  SOLN COMPARISON:  12/15/14  FINDINGS: Midline dorsal soft tissue wound extending from tip of coccyx to the dermis. Another soft tissue wound is seen on the left overlying the ischial tuberosity extending to the dermis. In this latter wound there may be a 12 mm rim enhancing fluid collection, suggesting a tiny abscess. No underlying osseous abnormalities suggesting osteomyelitis. No acute abnormalities within the pelvis. IMPRESSION: Evidence of 2 decubitus ulcers as described above with no findings to suggest osteomyelitis. Electronically Signed   By: Skipper Cliche M.D.   On: 07/05/2015 21:25     Assessment/Plan Principal Problem:   Sacral ulcer Active Problems:   MS (multiple sclerosis) (HCC)   DVT, lower extremity (HCC)   Chronic anemia   Diabetes mellitus type 2, controlled ([place])   1. Infected sacral decubitus ulcer - patient has been placed on vancomycin and Zosyn. I have requested wound team consult for further recommendations. Patient has history of DVT and is on xarelto. In anticipation of possible procedure in the morning of xarelto and placing patient on heparin infusion. 2. Chronic anemia with history of GI bleed in last July 2016 - follow CBC closely.  Patient has chronic anemia. EGD in July 2016 was unremarkable. 3. History of DVT on xarelto - anticipation of possible procedures and placing patient on heparin infusion and will hold off xarelto for now. 4. Diabetes mellitus type 2 - or metformin. Patient has been placed on sliding scale coverage. 5. Hypertension - since patient's blood pressure is in the low-normal I'm holding off patient's lisinopril and Lasix for now. Patient has been placed on fluid and closely follow blood pressure trends. 6. History of sepsis in July of last year. 7. History of multiple sclerosis presently on no treatment.  Patient's repeat lactic acid levels and procalcitonin levels are pending. Patient is not sure if she was taking all her medications. Patient probably will need placement.   DVT Prophylaxis heparin infusion.  Code Status: Full code.  Family Communication: Discussed with patient.  Disposition Plan: Admit to inpatient.    Heily Carlucci N. [facility] Pager [phone].  If 7PM-7AM, please contact night-coverage [email or web address] Password [facility] 07/05/2015, 10:33 PM

## 2015-07-05 NOTE — ED Notes (Signed)
Bed: WA07 Expected date:  Expected time:  Means of arrival:  Comments: Ems-decubitus

## 2015-07-05 NOTE — ED Notes (Signed)
Pt is presented from by medics who report they were called to transport pt for wounds evaluation. Pt has un staged decubitus ulcers, malodorous drainage, pt endorses no prior medical evaluation for these wounds despite being diabetic.

## 2015-07-06 DIAGNOSIS — L89159 Pressure ulcer of sacral region, unspecified stage: Secondary | ICD-10-CM

## 2015-07-06 DIAGNOSIS — L8990 Pressure ulcer of unspecified site, unspecified stage: Secondary | ICD-10-CM

## 2015-07-06 DIAGNOSIS — N390 Urinary tract infection, site not specified: Secondary | ICD-10-CM

## 2015-07-06 LAB — BASIC METABOLIC PANEL
Anion gap: 6 (ref 5–15)
BUN: 18 mg/dL (ref 6–20)
CALCIUM: 9.4 mg/dL (ref 8.9–10.3)
CO2: 25 mmol/L (ref 22–32)
CREATININE: 0.62 mg/dL (ref 0.44–1.00)
Chloride: 111 mmol/L (ref 101–111)
GFR calc non Af Amer: >60 mL/min (ref >60)
GLUCOSE: 157 mg/dL — AB (ref 65–99)
Potassium: 3.7 mmol/L (ref 3.5–5.1)
Sodium: 142 mmol/L (ref 135–145)

## 2015-07-06 LAB — CBC
HCT: 24.8 % — ABNORMAL LOW (ref 36.0–46.0)
Hemoglobin: 7.9 g/dL — ABNORMAL LOW (ref 12.0–15.0)
MCH: 28.2 pg (ref 26.0–34.0)
MCHC: 31.9 g/dL (ref 30.0–36.0)
MCV: 88.6 fL (ref 78.0–100.0)
PLATELETS: 414 10*3/uL — AB (ref 150–400)
RBC: 2.8 MIL/uL — ABNORMAL LOW (ref 3.87–5.11)
RDW: 17.2 % — AB (ref 11.5–15.5)
WBC: 10 10*3/uL (ref 4.0–10.5)

## 2015-07-06 LAB — GLUCOSE, CAPILLARY
Glucose-Capillary: 143 mg/dL — ABNORMAL HIGH (ref 65–99)
Glucose-Capillary: 191 mg/dL — ABNORMAL HIGH (ref 65–99)
Glucose-Capillary: 79 mg/dL (ref 65–99)
Glucose-Capillary: 106 mg/dL — ABNORMAL HIGH (ref 65–99)

## 2015-07-06 LAB — TYPE AND SCREEN
ABO/RH(D): A POS
ANTIBODY SCREEN: NEGATIVE

## 2015-07-06 LAB — HEPARIN LEVEL (UNFRACTIONATED)
Heparin Unfractionated: 0.24 IU/mL — ABNORMAL LOW (ref 0.30–0.70)
Heparin Unfractionated: 0.3 IU/mL (ref 0.30–0.70)

## 2015-07-06 LAB — LACTIC ACID, PLASMA: Lactic Acid, Venous: 0.9 mmol/L (ref 0.5–2.0)

## 2015-07-06 MED ORDER — HEPARIN (PORCINE) IN NACL 100-0.45 UNIT/ML-% IJ SOLN
1350.0000 [IU]/h | INTRAMUSCULAR | Status: DC
  Filled 2015-07-06 (×2): qty 250

## 2015-07-06 MED ORDER — HEPARIN (PORCINE) IN NACL 100-0.45 UNIT/ML-% IJ SOLN
1250.0000 [IU]/h | INTRAMUSCULAR | Status: DC
  Administered 2015-07-06: 1250 [IU]/h via INTRAVENOUS
  Filled 2015-07-06: qty 250

## 2015-07-06 MED ORDER — HEPARIN BOLUS VIA INFUSION
1000.0000 [IU] | Freq: Once | INTRAVENOUS | Status: DC
Start: 2015-07-06 — End: 2015-07-07
  Filled 2015-07-06: qty 1000

## 2015-07-06 MED ORDER — HEPARIN (PORCINE) IN NACL 100-0.45 UNIT/ML-% IJ SOLN
1100.0000 [IU]/h | INTRAMUSCULAR | Status: DC
  Administered 2015-07-06: 1100 [IU]/h via INTRAVENOUS
  Filled 2015-07-06: qty 250

## 2015-07-06 MED ORDER — SODIUM CHLORIDE 0.9 % IV BOLUS (SEPSIS)
250.0000 mL | Freq: Once | INTRAVENOUS | Status: AC
  Administered 2015-07-06: 250 mL via INTRAVENOUS

## 2015-07-06 MED ORDER — HEPARIN BOLUS VIA INFUSION
3000.0000 [IU] | Freq: Once | INTRAVENOUS | Status: AC
  Administered 2015-07-06: 3000 [IU] via INTRAVENOUS
  Filled 2015-07-06: qty 3000

## 2015-07-06 MED ORDER — SODIUM CHLORIDE 0.9 % IV SOLN
INTRAVENOUS | Status: AC
  Administered 2015-07-06: 23:00:00 via INTRAVENOUS
  Administered 2015-07-06: 500 mL via INTRAVENOUS

## 2015-07-06 NOTE — Progress Notes (Signed)
ANTICOAGULATION CONSULT NOTE - follow up  Pharmacy Consult for IV Heparin Indication: Hx of DVT on Xarelto PTA  No Known Allergies  Patient Measurements: Height: 5\' 6"  (167.6 cm) Weight: 143 lb 1.3 oz (64.9 kg) IBW/kg (Calculated) : 59.3 Heparin Dosing Weight: 64.9 kg  Vital Signs: Temp: 99 F (37.2 C) (01/23 0511) Temp Source: Oral (01/23 0511) BP: 96/46 mmHg (01/23 0511) Pulse Rate: 98 (01/23 0511)  Labs:  Recent Labs  07/05/15 1750 07/05/15 2318 07/06/15 0412 07/06/15 0825  HGB 9.1*  --  7.9*  --   HCT 29.1*  --  24.8*  --   PLT 481*  --  414*  --   APTT  --  32  --   --   LABPROT  --  14.8  --   --   INR  --  1.14  --   --   HEPARINUNFRC  --  <0.10*  --  0.24*  CREATININE 0.65  --  0.62  --     Estimated Creatinine Clearance: 73.5 mL/min (by C-G formula based on Cr of 0.62).   Medical History: Past Medical History  Diagnosis Date  . MS (multiple sclerosis) ([place])   . Hyperglycemia   . Edema   . High blood pressure   . Diabetes mellitus without complication ([place])   . Pyelonephritis 12/16/2014    Medications:  Scheduled:  . baclofen  10 mg Oral TID  . darifenacin  7.5 mg Oral Daily  . gabapentin  100 mg Oral QHS  . glipiZIDE  5 mg Oral BID  . heparin  1,000 Units Intravenous Once  . insulin aspart  0-9 Units Subcutaneous TID WC  . omega-3 acid ethyl esters  1,000 mg Oral Daily  . piperacillin-tazobactam (ZOSYN)  IV  3.375 g Intravenous Q8H  . pravastatin  20 mg Oral q1800  . sodium chloride  3 mL Intravenous Q12H  . vancomycin  750 mg Intravenous Q12H  . vitamin C  500 mg Oral Daily   Infusions:  . sodium chloride    . heparin      Assessment: 46 yoF admitted for wounds evaluation found to have stage IV decubitus ulcers with malordorous drainage.  Pt on xarelto PTA for hx of LE DVT LD 1/20 am.  IV Heparin per Rx. 1/22 Baseline coags:  INR=1.14, aPtt=32 and HL=<0.10 07/06/2015  HL= 0.24 subtherapeutic No bleeding or issues per RN   Goal  of Therapy:  Heparin level 0.3-0.7 units/ml Monitor platelets by anticoagulation protocol: Yes   Plan:   Heparin 1000 unit bolus x1 increase drip to 1250 Units/hr (12.74ml/hr)  Daily CBC/HL  Check heparin level in 6 hours  Dolly Rias RPh 07/06/2015, 10:05 AM Pager [phone]

## 2015-07-06 NOTE — Consult Note (Signed)
WOC wound consult note Reason for Consult:Pressure injuries Wound type:Pressure Pressure Ulcer POA: Yes Measurement:left ischial tuberosity (Stage 3):  3cm x 2cm x 1cm, red, moist.  Right ischial tuberosity (Unstageable):  12.5cm x 2.5cm with soft, movable eschar in center obscuring depth.  Sacral (stage 3): red, moist and measuring 1.5cm x 2cm x 0.5cm.  Left posterior thigh:  2cm x 1cm x 0.2cm (Stage 2) Wound bed:AS described above Drainage (amount, consistency, odor) small amount of serous exudate, no odor. Periwound:intact, dry Dressing procedure/placement/frequency:IU will provide Nursing with guidance for a calcium alginate to absorb exudate and provide a moist healing environment for the pressure injuries. Patient wears bilateral pressure redistribution boots (she has lymphedema) and there are currently no pressure injuries on the bilateral heels. I will provide a mattress replacement and patient will continue to turn from side to side, keeping HOB at or below a 30 degree angles to reduce the pressure on the ischial tuberosities. [place] nursing team will not follow, but will remain available to this patient, the nursing and medical teams.  Please re-consult if needed. Thanks, Maudie Flakes, MSN, RN, Agency, [facility]  Pager# [phone]

## 2015-07-06 NOTE — Progress Notes (Signed)
CSW received referral for potential New SNF.  CSW to await wound care and PT recommendations.   CSW to complete full psychosocial assessment when appropriate.  Alison Murray, MSW, [facility] [phone]

## 2015-07-06 NOTE — Progress Notes (Signed)
[facility] Progress Note   [name]  [id number]  DOB: 01-Oct-1960  DOA: 07/05/2015 PCP: [name], MD  Brief narrative: [name] is a 55 y.o. female with MS who is bedbound present for sacral decubitus ulcers with foul smelling discharge.    Subjective: No complaints of pain, fever, chills, nausea, vomiting or diarrhea.   Assessment/Plan: Principal Problem:   Sacral ulcer- infected - cont Vanc and Zosyn- would care eval requested  Active Problems: UTI - f/u cultures- cont antibiotics    MS (multiple sclerosis) ; paraplegic - bed bound - has upper extermity weakness as well (mainly right sided)    DVT, lower extremity  - holding Xarelto in case surgical procedure needed- on Heparin infusion    Chronic anemia - Hb dropping to 7.9 (dilutional?) - follow- anemia panel in AM    Diabetes mellitus type 2, controlled (HCC) - cont sliding scale and Glipizide   Antibiotics: Anti-infectives    Start     Dose/Rate Route Frequency Ordered Stop   07/06/15 0800  vancomycin (VANCOCIN) IVPB 750 mg/150 ml premix     750 mg 150 mL/hr over 60 Minutes Intravenous Every 12 hours 07/05/15 1833     07/06/15 0400  piperacillin-tazobactam (ZOSYN) IVPB 3.375 g     3.375 g 12.5 mL/hr over 240 Minutes Intravenous Every 8 hours 07/05/15 1833     07/05/15 1815  vancomycin (VANCOCIN) IVPB 1000 mg/200 mL premix     1,000 mg 200 mL/hr over 60 Minutes Intravenous  Once 07/05/15 1813 07/05/15 2024   07/05/15 1815  piperacillin-tazobactam (ZOSYN) IVPB 3.375 g     3.375 g 100 mL/hr over 30 Minutes Intravenous  Once 07/05/15 1813 07/05/15 1910     Code Status:     Code Status Orders        Start     Ordered   07/05/15 2232  Full code   Continuous     07/05/15 2232    Code Status History    Date Active Date Inactive Code Status Order ID Comments User Context   12/31/2014  3:59 PM 01/03/2015  5:50 PM Full Code [id number]  Nita Sells, MD Inpatient   12/14/2014   5:21 AM 12/21/2014  6:32 PM Full Code [id number]  Mariea Clonts, MD Inpatient   12/13/2014  8:13 PM 12/14/2014  5:21 AM Full Code [id number]  Nita Sells, MD Inpatient   06/25/2012 11:15 PM 06/30/2012  2:48 PM Full Code [id number]  Arlyss Repress, MD ED     Family Communication: sig other at bedside Disposition Plan: likely will need SNF for wound care DVT prophylaxis: Heparin infusion Consultants:  Procedures:     Objective: Filed Weights   07/05/15 1800 07/05/15 2222  Weight: 67 kg (147 lb 11.3 oz) 64.9 kg (143 lb 1.3 oz)    Intake/Output Summary (Last 24 hours) at 07/06/15 1447 Last data filed at 07/06/15 1345  Gross per 24 hour  Intake   1135 ml  Output   2875 ml  Net  -1740 ml     Vitals Filed Vitals:   07/05/15 1930 07/05/15 2222 07/06/15 0511 07/06/15 1345  BP: 139/80 133/56 96/46 99/55   Pulse: 108 95 98 85  Temp:  98.7 F (37.1 C) 99 F (37.2 C) 98.7 F (37.1 C)  TempSrc:  Oral Oral Oral  Resp: 18 16 16 16   Height:  5\' 6"  (1.676 m)    Weight:  64.9 kg (143 lb 1.3 oz)    SpO2: 97%  100% 100% 99%    Exam:  General:  Pt is alert, not in acute distress  HEENT: No icterus, No thrush, oral mucosa moist  Cardiovascular: regular rate and rhythm, S1/S2 No murmur  Respiratory: clear to auscultation bilaterally   Abdomen: Soft, +Bowel sounds, non tender, non distended, no guarding  MSK: No cyanosis or clubbing- no pedal edema   Data Reviewed: Basic Metabolic Panel:  Recent Labs Lab 07/05/15 1750 07/06/15 0412  NA 144 142  K 4.1 3.7  CL 112* 111  CO2 25 25  GLUCOSE 168* 157*  BUN 21* 18  CREATININE 0.65 0.62  CALCIUM 10.3 9.4   Liver Function Tests:  Recent Labs Lab 07/05/15 1750  AST 12*  ALT 10*  ALKPHOS 52  BILITOT 0.5  PROT 7.5  ALBUMIN 2.9*   No results for input(s): LIPASE, AMYLASE in the last 168 hours. No results for input(s): AMMONIA in the last 168 hours. CBC:  Recent Labs Lab 07/05/15 1750 07/06/15 0412  WBC 11.4* 10.0   NEUTROABS 6.6  --   HGB 9.1* 7.9*  HCT 29.1* 24.8*  MCV 88.7 88.6  PLT 481* 414*   Cardiac Enzymes: No results for input(s): CKTOTAL, CKMB, CKMBINDEX, TROPONINI in the last 168 hours. BNP (last 3 results)  Recent Labs  12/13/14 1620  BNP 56.9    ProBNP (last 3 results) No results for input(s): PROBNP in the last 8760 hours.  CBG:  Recent Labs Lab 07/05/15 2342 07/06/15 0854 07/06/15 1205  GLUCAP 156* 143* 191*    No results found for this or any previous visit (from the past 240 hour(s)).   Studies: Ct Pelvis W Contrast  07/05/2015  CLINICAL DATA:  New decubitus wound, current history of multiple sclerosis, diabetes, immobility EXAM: CT PELVIS WITH CONTRAST TECHNIQUE: Multidetector CT imaging of the pelvis was performed using the standard protocol following the bolus administration of intravenous contrast. CONTRAST:  18mL OMNIPAQUE IOHEXOL 300 MG/ML  SOLN COMPARISON:  12/15/14 FINDINGS: Midline dorsal soft tissue wound extending from tip of coccyx to the dermis. Another soft tissue wound is seen on the left overlying the ischial tuberosity extending to the dermis. In this latter wound there may be a 12 mm rim enhancing fluid collection, suggesting a tiny abscess. No underlying osseous abnormalities suggesting osteomyelitis. No acute abnormalities within the pelvis. IMPRESSION: Evidence of 2 decubitus ulcers as described above with no findings to suggest osteomyelitis. Electronically Signed   By: Skipper Cliche M.D.   On: 07/05/2015 21:25    Scheduled Meds:  Scheduled Meds: . baclofen  10 mg Oral TID  . darifenacin  7.5 mg Oral Daily  . gabapentin  100 mg Oral QHS  . glipiZIDE  5 mg Oral BID  . heparin  1,000 Units Intravenous Once  . insulin aspart  0-9 Units Subcutaneous TID WC  . omega-3 acid ethyl esters  1,000 mg Oral Daily  . piperacillin-tazobactam (ZOSYN)  IV  3.375 g Intravenous Q8H  . pravastatin  20 mg Oral q1800  . sodium chloride  3 mL Intravenous Q12H   . vancomycin  750 mg Intravenous Q12H  . vitamin C  500 mg Oral Daily   Continuous Infusions: . sodium chloride 500 mL (07/06/15 1058)  . heparin      Time spent on care of this patient: 35 min   Gap, MD 07/06/2015, 2:47 PM  LOS: 1 day   [facility] Office  [phone] Pager - Text Page per [email or web address] If 7PM-7AM, please contact night-coverage [email or web address]

## 2015-07-06 NOTE — Progress Notes (Addendum)
PHARMACIST - PHYSICIAN COMMUNICATION CONCERNING:  IV heparin  27 yoF on IV heparin bridge from xarelto for hx LE DVT in case pt needs OR procedure for stage IV decubitus ulcers.  Heparin currently at 1250 units/hr.  Heparin level tonight = 0.30 (goal 0.3-0.7), at low end of therapeutic range.  No bleeding or infusion issues per RN though multiple wounds and hgb 7.9 noted.    RECOMMENDATION: Increase heparin infusion to 1350 units/hr =13.5 ml/hr.  No bolus. Check heparin level in 6 hours.   Ralene Bathe, PharmD, BCPS 07/06/2015, 7:00 PM  Pager: [phone]

## 2015-07-06 NOTE — Progress Notes (Signed)
Patient arrived on the unit at approximately 2122. She is alert and verbally responsive. Note pressure injury to bilateral heels. Prevalon boots applied. 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 Has non-pitting edema to BLE. Heparin drip infusing at 11cc/hr. Has wounds to sacrum and ischial areas.

## 2015-07-07 DIAGNOSIS — L8994 Pressure ulcer of unspecified site, stage 4: Secondary | ICD-10-CM

## 2015-07-07 DIAGNOSIS — E119 Type 2 diabetes mellitus without complications: Secondary | ICD-10-CM

## 2015-07-07 DIAGNOSIS — I82409 Acute embolism and thrombosis of unspecified deep veins of unspecified lower extremity: Secondary | ICD-10-CM

## 2015-07-07 LAB — GLUCOSE, CAPILLARY
GLUCOSE-CAPILLARY: 45 mg/dL — AB (ref 65–99)
GLUCOSE-CAPILLARY: 46 mg/dL — AB (ref 65–99)
GLUCOSE-CAPILLARY: 58 mg/dL — AB (ref 65–99)
Glucose-Capillary: 86 mg/dL (ref 65–99)
Glucose-Capillary: 171 mg/dL — ABNORMAL HIGH (ref 65–99)
Glucose-Capillary: 73 mg/dL (ref 65–99)
Glucose-Capillary: 128 mg/dL — ABNORMAL HIGH (ref 65–99)

## 2015-07-07 LAB — URINE CULTURE

## 2015-07-07 LAB — HEPARIN LEVEL (UNFRACTIONATED)
HEPARIN UNFRACTIONATED: 0.49 [IU]/mL (ref 0.30–0.70)
Heparin Unfractionated: 0.39 IU/mL (ref 0.30–0.70)

## 2015-07-07 MED ORDER — RIVAROXABAN 20 MG PO TABS
20.0000 mg | ORAL_TABLET | Freq: Every day | ORAL | Status: DC
  Administered 2015-07-07 – 2015-07-08 (×2): 20 mg via ORAL
  Filled 2015-07-07 (×2): qty 1

## 2015-07-07 MED ORDER — ADULT MULTIVITAMIN W/MINERALS CH
1.0000 | ORAL_TABLET | Freq: Every day | ORAL | Status: DC
  Administered 2015-07-07 – 2015-07-09 (×3): 1 via ORAL
  Filled 2015-07-07 (×3): qty 1

## 2015-07-07 MED ORDER — PRO-STAT SUGAR FREE PO LIQD
30.0000 mL | Freq: Two times a day (BID) | ORAL | Status: DC
  Administered 2015-07-07: 22:00:00 via ORAL
  Administered 2015-07-08 – 2015-07-09 (×3): 30 mL via ORAL
  Filled 2015-07-07 (×4): qty 30

## 2015-07-07 NOTE — Evaluation (Signed)
Physical Therapy Evaluation Patient Details Name: [name] MRN: [id number] DOB: March 08, 1961 Today's Date: 07/07/2015   History of Present Illness  55 y.o. female with h/o DM, DVT, MS and is WC bound at baseline admitted with multiple sacral and buttock ulcers  Clinical Impression  Pt admitted with above diagnosis. Pt currently with functional limitations due to the deficits listed below (see PT Problem List). +2 assist for supine to sit, pt sat on EOB for 10 min. Transfer not attempted as pt needed to use bedpan, but at baseline pt is lifted into WC by 1 person. SNF recommended.  Pt will benefit from skilled PT to increase their independence and safety with mobility to allow discharge to the venue listed below.       Follow Up Recommendations SNF;Supervision/Assistance - 24 hour    Equipment Recommendations  None recommended by PT    Recommendations for Other Services OT consult     Precautions / Restrictions Precautions Precautions: Fall Restrictions Weight Bearing Restrictions: No      Mobility  Bed Mobility Overal bed mobility: Needs Assistance;+2 for physical assistance Bed Mobility: Supine to Sit     Supine to sit: +2 for physical assistance;Total assist     General bed mobility comments: pt 10%, assist to advance BLEs and to raise trunk; pt sat on EOB x 10 min with BLEs supported and single UE support without LOB  Transfers                 General transfer comment: NT- pt needed to have BM just prior to attempted transfer so assisted pt back to supine and placed bedpan, nursing aware; pt reports at baseline 1 person lifts her to [facility]  Ambulation/Gait                Stairs            Wheelchair Mobility    Modified Rankin (Stroke Patients Only)       Balance Overall balance assessment: Needs assistance Sitting-balance support: Feet supported;Single extremity supported Sitting balance-Leahy Scale: Poor Sitting balance - Comments: sat  on EOB x 10 min                                     Pertinent Vitals/Pain Pain Assessment: No/denies pain    Home Living Family/patient expects to be discharged to:: Skilled nursing facility Living Arrangements: Non-relatives/Friends               Additional Comments: Patient reports ex-boyfriend helps her, but works 3rd shift, has been having friend stay with her at night    Prior Function Level of Independence: Needs assistance   Gait / Transfers Assistance Needed: transfers with assistance to power chair  ADL's / Homemaking Assistance Needed: assist for sponge bath and dressing        Hand Dominance   Dominant Hand: Right (R handed but has more function in LUE)    Extremity/Trunk Assessment   Upper Extremity Assessment: RUE deficits/detail;LUE deficits/detail RUE Deficits / Details: shoulder flexion AROM 40*, elbow flexion AROM WNL strength -4/5, elbow extension -20* strength 4/5,  grip +3/5     LUE Deficits / Details: shoulder flexion AROM 120*, elbow/hand 4/5   Lower Extremity Assessment: RLE deficits/detail;LLE deficits/detail RLE Deficits / Details: no functional movement BLEs, sensation intact to light touch LLE Deficits / Details: no functional movement BLEs     Communication  Communication: No difficulties  Cognition Arousal/Alertness: Awake/alert Behavior During Therapy: WFL for tasks assessed/performed Overall Cognitive Status: Within Functional Limits for tasks assessed                      General Comments      Exercises        Assessment/Plan    PT Assessment Patient needs continued PT services  PT Diagnosis Generalized weakness   PT Problem List Decreased strength;Decreased range of motion;Decreased activity tolerance;Decreased balance;Decreased mobility;Decreased skin integrity  PT Treatment Interventions Functional mobility training;Therapeutic activities;Patient/family education;Therapeutic exercise;Balance  training   PT Goals (Current goals can be found in the Care Plan section) Acute Rehab PT Goals Patient Stated Goal: improve mobility PT Goal Formulation: With patient Time For Goal Achievement: 07/21/15 Potential to Achieve Goals: Good    Frequency Min 3X/week   Barriers to discharge        Co-evaluation               End of Session   Activity Tolerance: No increased pain;Patient tolerated treatment well Patient left: in bed;with call bell/phone within reach;with bed alarm set Nurse Communication: Need for lift equipment;Mobility status         Time: [id number] PT Time Calculation (min) (ACUTE ONLY): 30 min   Charges:   PT Evaluation $PT Eval Moderate Complexity: 1 Procedure PT Treatments $Therapeutic Activity: 8-22 mins   PT G Codes:        Philomena Doheny 07/07/2015, 9:36 AM [phone]

## 2015-07-07 NOTE — Progress Notes (Signed)
ANTICOAGULATION CONSULT NOTE - Follow Up Consult  Pharmacy Consult for Heparin Indication: heparin bridge of rivaroxban--for hx of DVT  No Known Allergies  Patient Measurements: Height: 5\' 6"  (167.6 cm) Weight: 143 lb 1.3 oz (64.9 kg) IBW/kg (Calculated) : 59.3 Heparin Dosing Weight:   Vital Signs: Temp: 98.5 F (36.9 C) (01/23 2131) Temp Source: Oral (01/23 2131) BP: 91/72 mmHg (01/23 2131) Pulse Rate: 85 (01/23 2131)  Labs:  Recent Labs  07/05/15 1750  07/05/15 2318 07/06/15 0412 07/06/15 0825 07/06/15 1442 07/07/15 0118  HGB 9.1*  --   --  7.9*  --   --   --   HCT 29.1*  --   --  24.8*  --   --   --   PLT 481*  --   --  414*  --   --   --   APTT  --   --  32  --   --   --   --   LABPROT  --   --  14.8  --   --   --   --   INR  --   --  1.14  --   --   --   --   HEPARINUNFRC  --   < > <0.10*  --  0.24* 0.30 0.39  CREATININE 0.65  --   --  0.62  --   --   --   < > = values in this interval not displayed.  Estimated Creatinine Clearance: 73.5 mL/min (by C-G formula based on Cr of 0.62).   Medications:  Infusions:  . sodium chloride 500 mL (07/06/15 1058)  . heparin 1,350 Units/hr (07/06/15 1915)    Assessment: Patient with heparin level at goal.  No heparin issues noted.  Goal of Therapy:  Heparin level 0.3-0.7 units/ml Monitor platelets by anticoagulation protocol: Yes   Plan:  Continue heparin drip at current rate Recheck level at [address], [place] 07/07/2015,4:45 AM

## 2015-07-07 NOTE — Progress Notes (Signed)
Initial Nutrition Assessment  DOCUMENTATION CODES:   Non-severe (moderate) malnutrition in context of chronic illness  INTERVENTION:   -Provide Prostat liquid protein PO 30 ml BID with meals, each supplement provides 100 kcal, 15 grams protein. -Multivitamin with minerals daily -Encourage PO intake -RD to continue to monitor  NUTRITION DIAGNOSIS:   Increased nutrient needs related to wound healing as evidenced by estimated needs.  GOAL:   Patient will meet greater than or equal to 90% of their needs  MONITOR:   PO intake, Supplement acceptance, Labs, Weight trends, Skin, I & O's  REASON FOR ASSESSMENT:   Other (Comment) (Stage IV sacral and ischial ulcer)    ASSESSMENT:   55 y.o. female past medical history of MS bedbound presents with sacral decubitus ulcer foul-smelling  Pt reports good appetite and eating well now and PTA. Pt had an egg white omelette, grits, and oranges for breakfast and baked pork with greens and oranges for lunch. PO intake: 100%. Pt with fluid restriction of 1200 ml.  Pt with deep tissue injury to heel and multiple stage IV wounds on sacrum and ischial tuberosity. To aid in wound healing, RD to order 30 ml Prostat BID and daily multivitamin. Pt has been ordered Vitamin C as well.   Nutrition-Focused physical exam completed. Findings are mild-moderate fat depletion, mild-moderate muscle depletion.  Labs reviewed: CBGs: 46-171  Diet Order:  Diet heart healthy/carb modified Room service appropriate?: Yes; Fluid consistency:: Thin; Fluid restriction:: 1200 mL Fluid  Skin:    Stage IV sacral and ischial ulcers, DTI on heel  Last BM:  1/21  Height:   Ht Readings from Last 1 Encounters:  07/05/15 5\' 6"  (1.676 m)    Weight:   Wt Readings from Last 1 Encounters:  07/07/15 149 lb 11.1 oz (67.9 kg)    Ideal Body Weight:  59 kg  BMI:  Body mass index is 24.17 kg/(m^2).  Estimated Nutritional Needs:   Kcal:  2100-2300  Protein:   105-115g  Fluid:  2.2L/day  EDUCATION NEEDS:   Education needs addressed  Clayton Bibles, MS, RD, LDN Pager: [phone] After Hours Pager: [phone]

## 2015-07-07 NOTE — Progress Notes (Signed)
[facility] PROGRESS NOTE    Progress Note   [name] [id number] DOB: [birth year] DOA: 07-12-15 PCP: [name], MD   Brief Narrative:   [name] is an 55 y.o. female past medical history of MS bedbound presents with sacral decubitus ulcer foul-smelling  Assessment/Plan:   Infected Sacral ulcer stage IV: Started empirically on IV vancomycin and Zosyn, has remained afebrile leukocytosis is improved. Wound care evaluated the patient and recommended chemical debridement and to wear bilateral pressure boot and a mitral replacement.  Possible UTI: Started on on Zosyn awaiting urine cultures.  MS (multiple sclerosis) ([place]) Bedbound with upper extremity weakness.  DVT, lower extremity (HCC) Resume Xarelto.   Anemia of chronic disease: Anemia panel is pending, ferritin is probably unreliable.  Diabetes mellitus type 2, controlled ([place]); Continued leukocytes and sliding scale    DVT Prophylaxis - Lovenox ordered.  Family Communication: none Disposition Plan: Home when stable. Code Status:     Code Status Orders        Start     Ordered   July 12, 2015 2232  Full code   Continuous     07-12-15 2232    Code Status History    Date Active Date Inactive Code Status Order ID Comments User Context   12/31/2014  3:59 PM 01/03/2015  5:50 PM Full Code [id number]  Nita Sells, MD Inpatient   12/14/2014  5:21 AM 12/21/2014  6:32 PM Full Code [id number]  Mariea Clonts, MD Inpatient   12/13/2014  8:13 PM 12/14/2014  5:21 AM Full Code [id number]  Nita Sells, MD Inpatient   06/25/2012 11:15 PM 06/30/2012  2:48 PM Full Code [id number]  Arlyss Repress, MD ED        IV Access:    Peripheral IV   Procedures and diagnostic studies:   Ct Pelvis W Contrast  12-Jul-2015  CLINICAL DATA:  New decubitus wound, current history of multiple sclerosis, diabetes, immobility EXAM: CT PELVIS WITH CONTRAST TECHNIQUE: Multidetector CT imaging of the pelvis was performed  using the standard protocol following the bolus administration of intravenous contrast. CONTRAST:  143mL OMNIPAQUE IOHEXOL 300 MG/ML  SOLN COMPARISON:  12/15/14 FINDINGS: Midline dorsal soft tissue wound extending from tip of coccyx to the dermis. Another soft tissue wound is seen on the left overlying the ischial tuberosity extending to the dermis. In this latter wound there may be a 12 mm rim enhancing fluid collection, suggesting a tiny abscess. No underlying osseous abnormalities suggesting osteomyelitis. No acute abnormalities within the pelvis. IMPRESSION: Evidence of 2 decubitus ulcers as described above with no findings to suggest osteomyelitis. Electronically Signed   By: Skipper Cliche M.D.   On: 2015-07-12 21:25     Medical Consultants:    None.  Anti-Infectives:   Anti-infectives    Start     Dose/Rate Route Frequency Ordered Stop   07/06/15 0800  vancomycin (VANCOCIN) IVPB 750 mg/150 ml premix     750 mg 150 mL/hr over 60 Minutes Intravenous Every 12 hours Jul 12, 2015 1833     07/06/15 0400  piperacillin-tazobactam (ZOSYN) IVPB 3.375 g     3.375 g 12.5 mL/hr over 240 Minutes Intravenous Every 8 hours 12-Jul-2015 1833     Jul 12, 2015 1815  vancomycin (VANCOCIN) IVPB 1000 mg/200 mL premix     1,000 mg 200 mL/hr over 60 Minutes Intravenous  Once 12-Jul-2015 1813 Jul 12, 2015 2024   07/12/15 1815  piperacillin-tazobactam (ZOSYN) IVPB 3.375 g     3.375 g 100 mL/hr over 30 Minutes Intravenous  Once 07/05/15 1813 07/05/15 1910      Subjective:    [name] she has no new complaints.  Objective:    Filed Vitals:   07/06/15 0511 07/06/15 1345 07/06/15 2131 07/07/15 0612  BP: 96/46 99/55 91/72  101/58  Pulse: 98 85 85 78  Temp: 99 F (37.2 C) 98.7 F (37.1 C) 98.5 F (36.9 C) 97.7 F (36.5 C)  TempSrc: Oral Oral Oral Oral  Resp: 16 16 16 16   Height:      Weight:    67.9 kg (149 lb 11.1 oz)  SpO2: 100% 99% 97% 100%    Intake/Output Summary (Last 24 hours) at 07/07/15  1220 Last data filed at 07/07/15 1159  Gross per 24 hour  Intake 1461.06 ml  Output   2900 ml  Net -1438.94 ml   Filed Weights   07/05/15 1800 07/05/15 2222 07/07/15 0612  Weight: 67 kg (147 lb 11.3 oz) 64.9 kg (143 lb 1.3 oz) 67.9 kg (149 lb 11.1 oz)    Exam: Gen:  NAD Cardiovascular:  RRR. Chest and lungs:   CTAB Abdomen:  Abdomen soft, NT/ND, + BS Extremities:  1+ edema   Data Reviewed:    Labs: Basic Metabolic Panel:  Recent Labs Lab 07/05/15 1750 07/06/15 0412  NA 144 142  K 4.1 3.7  CL 112* 111  CO2 25 25  GLUCOSE 168* 157*  BUN 21* 18  CREATININE 0.65 0.62  CALCIUM 10.3 9.4   GFR Estimated Creatinine Clearance: 73.5 mL/min (by C-G formula based on Cr of 0.62). Liver Function Tests:  Recent Labs Lab 07/05/15 1750  AST 12*  ALT 10*  ALKPHOS 52  BILITOT 0.5  PROT 7.5  ALBUMIN 2.9*   No results for input(s): LIPASE, AMYLASE in the last 168 hours. No results for input(s): AMMONIA in the last 168 hours. Coagulation profile  Recent Labs Lab 07/05/15 2318  INR 1.14    CBC:  Recent Labs Lab 07/05/15 1750 07/06/15 0412  WBC 11.4* 10.0  NEUTROABS 6.6  --   HGB 9.1* 7.9*  HCT 29.1* 24.8*  MCV 88.7 88.6  PLT 481* 414*   Cardiac Enzymes: No results for input(s): CKTOTAL, CKMB, CKMBINDEX, TROPONINI in the last 168 hours. BNP (last 3 results) No results for input(s): PROBNP in the last 8760 hours. CBG:  Recent Labs Lab 07/06/15 1653 07/06/15 2127 07/07/15 0743 07/07/15 0745 07/07/15 0811  GLUCAP 79 106* 45* 46* 86   D-Dimer: No results for input(s): DDIMER in the last 72 hours. Hgb A1c: No results for input(s): HGBA1C in the last 72 hours. Lipid Profile: No results for input(s): CHOL, HDL, LDLCALC, TRIG, CHOLHDL, LDLDIRECT in the last 72 hours. Thyroid function studies: No results for input(s): TSH, T4TOTAL, T3FREE, THYROIDAB in the last 72 hours.  Invalid input(s): FREET3 Anemia work up: No results for input(s): VITAMINB12,  FOLATE, FERRITIN, TIBC, IRON, RETICCTPCT in the last 72 hours. Sepsis Labs:  Recent Labs Lab 07/05/15 1750 07/05/15 1801 07/06/15 0412 07/06/15 0825  WBC 11.4*  --  10.0  --   LATICACIDVEN  --  0.76  --  0.9   Microbiology Recent Results (from the past 240 hour(s))  Urine culture     Status: None   Collection Time: 07/05/15  7:51 PM  Result Value Ref Range Status   Specimen Description URINE, CATHETERIZED  Final   Special Requests NONE  Final   Culture   Final    MULTIPLE SPECIES PRESENT, SUGGEST RECOLLECTION Performed at [facility]    Report Status 07/07/2015 FINAL  Final     Medications:   . baclofen  10 mg Oral TID  . darifenacin  7.5 mg Oral Daily  . gabapentin  100 mg Oral QHS  . glipiZIDE  5 mg Oral BID  . heparin  1,000 Units Intravenous Once  . insulin aspart  0-9 Units Subcutaneous TID WC  . omega-3 acid ethyl esters  1,000 mg Oral Daily  . piperacillin-tazobactam (ZOSYN)  IV  3.375 g Intravenous Q8H  . pravastatin  20 mg Oral q1800  . sodium chloride  3 mL Intravenous Q12H  . vancomycin  750 mg Intravenous Q12H  . vitamin C  500 mg Oral Daily   Continuous Infusions: . heparin 1,350 Units/hr (07/06/15 1915)    Time spent: 25 min   LOS: 2 days   Charlynne Cousins  [facility] Pager [phone]  *Please refer to [email or web address], password [email or web address] to get updated schedule on who will round on this patient, as hospitalists switch teams weekly. If 7PM-7AM, please contact night-coverage at [email or web address], password [email or web address] for any overnight needs.  07/07/2015, 12:20 PM

## 2015-07-07 NOTE — Clinical Social Work Note (Signed)
Clinical Social Work Assessment  Patient Details  Name: [name] MRN: [id number] Date of Birth: November 20, 1960  Date of referral:  07/07/15               Reason for consult:  Discharge Planning                Permission sought to share information with:    Permission granted to share information::  No  Name::        Agency::     Relationship::     Contact Information:     Housing/Transportation Living arrangements for the past 2 months:  Single Family Home Source of Information:  Patient Patient Interpreter Needed:  None Criminal Activity/Legal Involvement Pertinent to Current Situation/Hospitalization:  No - Comment as needed Significant Relationships:  Friend Lives with:  Self Do you feel safe going back to the place where you live?  No Need for family participation in patient care:  No (Coment)  Care giving concerns:  Pt admitted from home. PT recommending SNF and pt has wound care needs that need to be managed.    Social Worker assessment / plan:  CSW received referral for new SNF.  CSW met with pt at bedside. CSW introduced self and explained role. Pt reports that her ex-boyfriend lives with her at home. CSW discussed recommendations for rehab at [facility]. Pt agreeable and states that she has been to rehab in the past. Pt discussed that she does not want to go to [facility], but agreeable to SNF search to other facilities in [place]. CSW provided supportive listening as pt discussed previous experiences at SNF.   CSW completed FL2 and initiated SNF search to [place] per pt request.   CSW to follow up with pt re: SNF bed offers.  CSW to continue to follow to provide support and assist with pt disposition needs.    Employment status:  Disabled (Comment on whether or not currently receiving Disability) Insurance information:  Managed Medicare PT Recommendations:  [place] / Referral to  community resources:  [place]  Patient/Family's Response to care:  Pt alert and oriented x 4. Pt pleasant and actively involved in conversation. Pt agreeable to plans for rehab and familiar with process from past rehab stays.  Patient/Family's Understanding of and Emotional Response to Diagnosis, Current Treatment, and Prognosis:  Pt displays knowledge surrounding diagnosis and treatment needs.   Emotional Assessment Appearance:  Appears stated age Attitude/Demeanor/Rapport:  Other (pt appropriate) Affect (typically observed):  Appropriate Orientation:  Oriented to Self, Oriented to Place, Oriented to  Time, Oriented to Situation Alcohol / Substance use:  Not Applicable Psych involvement (Current and /or in the community):  No (Comment)  Discharge Needs  Concerns to be addressed:  Discharge Planning Concerns Readmission within the last 30 days:  No Current discharge risk:  Physical Impairment Barriers to Discharge:  Continued Medical Work up   Alison Murray A, LCSW 07/07/2015, 4:55 PM  [phone]

## 2015-07-07 NOTE — Progress Notes (Signed)
ANTICOAGULATION CONSULT NOTE - Follow Up Consult  Pharmacy Consult for Heparin Indication: heparin bridge of rivaroxban--for hx of DVT  No Known Allergies  Patient Measurements: Height: 5\' 6"  (167.6 cm) Weight: 149 lb 11.1 oz (67.9 kg) IBW/kg (Calculated) : 59.3 Heparin Dosing Weight:   Vital Signs: Temp: 97.7 F (36.5 C) (01/24 0612) Temp Source: Oral (01/24 0612) BP: 101/58 mmHg (01/24 0612) Pulse Rate: 78 (01/24 0612)  Labs:  Recent Labs  07/05/15 1750 07/05/15 2318 07/06/15 0412  07/06/15 1442 07/07/15 0118 07/07/15 0745  HGB 9.1*  --  7.9*  --   --   --   --   HCT 29.1*  --  24.8*  --   --   --   --   PLT 481*  --  414*  --   --   --   --   APTT  --  32  --   --   --   --   --   LABPROT  --  14.8  --   --   --   --   --   INR  --  1.14  --   --   --   --   --   HEPARINUNFRC  --  <0.10*  --   < > 0.30 0.39 0.49  CREATININE 0.65  --  0.62  --   --   --   --   < > = values in this interval not displayed.  Estimated Creatinine Clearance: 73.5 mL/min (by C-G formula based on Cr of 0.62).   Medications:  Infusions:  . heparin 1,350 Units/hr (07/06/15 1915)    Assessment: 55 y.o. female with MS who is bedbound present for sacral decubitus ulcers with foul smelling discharge. History of DVT, holding xarelto in case surgical procedure is needed.  Confirmatory heparin level at goal.  No heparin issues noted.  Goal of Therapy:  Heparin level 0.3-0.7 units/ml Monitor platelets by anticoagulation protocol: Yes   Plan:  Continue heparin drip at current rate Daily heparin level Awaiting plans for potential surgery/resume xarelto  Dolly Rias RPh 07/07/2015, 8:37 AM Pager [phone]

## 2015-07-07 NOTE — Clinical Social Work Placement (Signed)
   CLINICAL SOCIAL WORK PLACEMENT  NOTE  Date:  07/07/2015  Patient Details  Name: MADELEY ZAPALAC MRN: [id number] Date of Birth: 1961/06/04  Clinical Social Work is seeking post-discharge placement for this patient at the [place] level of care (*CSW will initial, date and re-position this form in  chart as items are completed):  Yes   Patient/family provided with [place] Work Department's list of facilities offering this level of care within the geographic area requested by the patient (or if unable, by the patient's family).  Yes   Patient/family informed of their freedom to choose among providers that offer the needed level of care, that participate in Medicare, Medicaid or managed care program needed by the patient, have an available bed and are willing to accept the patient.  Yes   Patient/family informed of [facility]'s ownership interest in [facility] and [facility], as well as of the fact that they are under no obligation to receive care at these facilities.  PASRR submitted to EDS on       PASRR number received on       Existing PASRR number confirmed on 07/07/15     FL2 transmitted to all facilities in geographic area requested by pt/family on 07/07/15     FL2 transmitted to all facilities within larger geographic area on       Patient informed that his/her managed care company has contracts with or will negotiate with certain facilities, including the following:            Patient/family informed of bed offers received.  Patient chooses bed at       Physician recommends and patient chooses bed at      Patient to be transferred to   on  .  Patient to be transferred to facility by       Patient family notified on   of transfer.  Name of family member notified:        PHYSICIAN Please sign FL2     Additional Comment:    _______________________________________________ [name], LCSW 07/07/2015, 4:52 PM

## 2015-07-07 NOTE — Progress Notes (Signed)
Inpatient Diabetes Program Recommendations  AACE/ADA: New Consensus Statement on Inpatient Glycemic Control (2015)  Target Ranges:  Prepandial:   less than 140 mg/dL      Peak postprandial:   less than 180 mg/dL (1-2 hours)      Critically ill patients:  140 - 180 mg/dL   Review of Glycemic Control  Results for SAMUELA, TINGLE (MRN [id number]) as of 07/07/2015 15:48  Ref. Range 07/06/2015 21:27 07/07/2015 07:43 07/07/2015 07:45 07/07/2015 08:11 07/07/2015 11:49  Glucose-Capillary Latest Ref Range: 65-99 mg/dL 106 (H) 45 (L) 46 (L) 86 171 (H)  Hypoglycemia with OHAs.   Inpatient Diabetes Program Recommendations:    D/C glipizide while inpatient to prevent hypoglycemia.  Will continue to follow. Thank you. Lorenda Peck, RD, LDN, CDE Inpatient Diabetes Coordinator [phone]

## 2015-07-07 NOTE — NC FL2 (Signed)
[place] LEVEL OF CARE SCREENING TOOL     IDENTIFICATION  Patient Name: [name] Birthdate: 06-Jun-1961 Sex: female Admission Date (Current Location): 07/05/2015  [facility] and [place] Number:  Herbalist and Address:  [facility],  [place], [place]      Provider Number: [id number]  Attending Physician Name and Address:  Charlynne Cousins, MD  Relative Name and Phone Number:       Current Level of Care: Hospital Recommended Level of Care: [place] Prior Approval Number:    Date Approved/Denied:   PASRR Number: [id number]  Discharge Plan: SNF    Current Diagnoses: Patient Active Problem List   Diagnosis Date Noted  . Stage 4 decubitus ulcer ([place]) 07/05/2015  . Chronic anemia 07/05/2015  . Diabetes mellitus type 2, controlled ([place]) 07/05/2015  . GI bleed due to NSAIDs 12/31/2014  . Urinary tract infection due to Proteus 12/28/2014  . Acute encephalopathy 12/28/2014  . Electrolyte abnormality 12/28/2014  . DVT, lower extremity ([place]) 12/28/2014  . Transaminitis 12/28/2014  . Pressure ulcer 12/15/2014  . Acute renal failure ([place])   . Septic shock ([place]) 12/14/2014  . AKI (acute kidney injury) ([place]) 12/13/2014  . At high risk for severe sepsis 12/13/2014  . Metabolic acidosis [id number]  . Sepsis ([place]) 12/13/2014  . MS (multiple sclerosis) ([place])   . Hyperglycemia   . Edema   . High blood pressure   . Acute blood loss anemia 06/28/2012  . UTI (lower urinary tract infection) 06/26/2012  . Multiple sclerosis diagnosed 2002-not on Therapy any longer 06/26/2012  . Bilateral leg edema 06/26/2012  . Diabetes mellitus ([place]) 06/26/2012  . Community acquired pneumonia 06/25/2012    Orientation RESPIRATION BLADDER Height & Weight    Self, Time, Situation, Place  Normal Indwelling catheter 5\' 6"  (167.6 cm) 149 lbs.  BEHAVIORAL SYMPTOMS/MOOD NEUROLOGICAL BOWEL NUTRITION STATUS   (n/a)  (n/a)  Continent Diet (heart healthy/carb modified)  AMBULATORY STATUS COMMUNICATION OF NEEDS Skin   Extensive Assist Verbally Other (Comment), PU Stage and Appropriate Care ( Wound care to bilateral Ischial tuberosity pressure injuries, left posterior thigh pressure injury and sacral pressure injury. See additional comments for WOC treatment.)                       Personal Care Assistance Level of Assistance  Bathing, Feeding, Dressing Bathing Assistance: Maximum assistance Feeding assistance: Independent Dressing Assistance: Maximum assistance     Functional Limitations Info  Sight, Hearing, Speech Sight Info: Adequate Hearing Info: Adequate Speech Info: Adequate    SPECIAL CARE FACTORS FREQUENCY  PT (By licensed PT), OT (By licensed OT)     PT Frequency: 5 x a week OT Frequency: 5 x a week            Contractures Contractures Info: Not present    Additional Factors Info  Code Status, Allergies, Insulin Sliding Scale Code Status Info: FULL code status Allergies Info: No Known Allergies   Insulin Sliding Scale Info: Novolog 3 x a day       Current Medications (07/07/2015):  This is the current hospital active medication list Current Facility-Administered Medications  Medication Dose Route Frequency Provider Last Rate Last Dose  . acetaminophen (TYLENOL) tablet 650 mg  650 mg Oral Q6H PRN Rise Patience, MD       Or  . acetaminophen (TYLENOL) suppository 650 mg  650 mg Rectal Q6H PRN Rise Patience,  MD      . albuterol (PROVENTIL) (2.5 MG/3ML) 0.083% nebulizer solution 3 mL  3 mL Inhalation BID PRN Rise Patience, MD      . baclofen (LIORESAL) tablet 10 mg  10 mg Oral TID Rise Patience, MD   10 mg at 07/07/15 1028  . darifenacin (ENABLEX) 24 hr tablet 7.5 mg  7.5 mg Oral Daily Rise Patience, MD   7.5 mg at 07/07/15 1028  . feeding supplement (PRO-STAT SUGAR FREE 64) liquid 30 mL  30 mL Oral BID Clayton Bibles, RD      . gabapentin (NEURONTIN)  capsule 100 mg  100 mg Oral QHS Rise Patience, MD   100 mg at 07/06/15 2229  . glipiZIDE (GLUCOTROL) tablet 5 mg  5 mg Oral BID Rise Patience, MD   5 mg at 07/07/15 [id number]  . HYDROcodone-acetaminophen (NORCO/VICODIN) 5-325 MG per tablet 1 tablet  1 tablet Oral Q6H PRN Rise Patience, MD      . insulin aspart (novoLOG) injection 0-9 Units  0-9 Units Subcutaneous TID WC Rise Patience, MD   2 Units at 07/07/15 1155  . morphine 2 MG/ML injection 1 mg  1 mg Intravenous Q4H PRN Rise Patience, MD      . multivitamin with minerals tablet 1 tablet  1 tablet Oral Daily Clayton Bibles, RD      . omega-3 acid ethyl esters (LOVAZA) capsule 1,000 mg  1,000 mg Oral Daily Rise Patience, MD   1,000 mg at 07/07/15 1028  . ondansetron (ZOFRAN) tablet 4 mg  4 mg Oral Q6H PRN Rise Patience, MD       Or  . ondansetron [facility]) injection 4 mg  4 mg Intravenous Q6H PRN Rise Patience, MD      . piperacillin-tazobactam (ZOSYN) IVPB 3.375 g  3.375 g Intravenous Q8H Adrian Saran, RPH   3.375 g at 07/07/15 1300  . pravastatin (PRAVACHOL) tablet 20 mg  20 mg Oral q1800 Rise Patience, MD   20 mg at 07/06/15 1837  . rivaroxaban (XARELTO) tablet 20 mg  20 mg Oral Q breakfast Charlynne Cousins, MD   20 mg at 07/07/15 1322  . sodium chloride 0.9 % injection 3 mL  3 mL Intravenous Q12H Rise Patience, MD   3 mL at 07/07/15 1000  . vancomycin (VANCOCIN) IVPB 750 mg/150 ml premix  750 mg Intravenous Q12H Adrian Saran, RPH   750 mg at 07/07/15 0831  . vitamin C (ASCORBIC ACID) tablet 500 mg  500 mg Oral Daily Rise Patience, MD   500 mg at 07/07/15 1028     Discharge Medications: Please see discharge summary for a list of discharge medications.  Relevant Imaging Results:  Relevant Lab Results:   Additional Information SSN: [id number].  Wound care to bilateral Ischial tuberosity pressure injuries, left posterior thigh pressure injury and sacral pressure injury  Cleanse with NS, pat gently dry. Cover with a small piece of calcium alginate dressing Kellie Simmering [phone]), cover with dry gauze 2x2 and top with soft silicone foam dressing. Change daily. And PRN loosening or rolling of dressing edges or soiling.  KIDD, SUZANNA A, LCSW

## 2015-07-08 DIAGNOSIS — Z7401 Bed confinement status: Secondary | ICD-10-CM

## 2015-07-08 LAB — GLUCOSE, CAPILLARY
GLUCOSE-CAPILLARY: 134 mg/dL — AB (ref 65–99)
GLUCOSE-CAPILLARY: 161 mg/dL — AB (ref 65–99)
GLUCOSE-CAPILLARY: 107 mg/dL — AB (ref 65–99)

## 2015-07-08 LAB — MRSA PCR SCREENING: MRSA BY PCR: NEGATIVE

## 2015-07-08 LAB — VANCOMYCIN, TROUGH: Vancomycin Tr: 13 ug/mL (ref 10.0–20.0)

## 2015-07-08 MED ORDER — RIVAROXABAN 20 MG PO TABS: 20.0000 mg | ORAL_TABLET | Freq: Every day | ORAL | Status: DC

## 2015-07-08 NOTE — Clinical Social Work Placement (Signed)
   CLINICAL SOCIAL WORK PLACEMENT  NOTE  Date:  07/08/2015  Patient Details  Name: [name] MRN: [id number] Date of Birth: 21-Sep-1960  Clinical Social Work is seeking post-discharge placement for this patient at the [place] level of care (*CSW will initial, date and re-position this form in  chart as items are completed):  Yes   Patient/family provided with [place] Work Department's list of facilities offering this level of care within the geographic area requested by the patient (or if unable, by the patient's family).  Yes   Patient/family informed of their freedom to choose among providers that offer the needed level of care, that participate in Medicare, Medicaid or managed care program needed by the patient, have an available bed and are willing to accept the patient.  Yes   Patient/family informed of [facility]'s ownership interest in [facility] and [facility], as well as of the fact that they are under no obligation to receive care at these facilities.  PASRR submitted to EDS on       PASRR number received on       Existing PASRR number confirmed on 07/07/15     FL2 transmitted to all facilities in geographic area requested by pt/family on 07/07/15     FL2 transmitted to all facilities within larger geographic area on       Patient informed that his/her managed care company has contracts with or will negotiate with certain facilities, including the following:        Yes   Patient/family informed of bed offers received.  Patient chooses bed at       Physician recommends and patient chooses bed at      Patient to be transferred to   on  .  Patient to be transferred to facility by       Patient family notified on   of transfer.  Name of family member notified:        PHYSICIAN Please sign FL2     Additional Comment:    _______________________________________________ [name], LCSW 07/08/2015, 3:18  PM

## 2015-07-08 NOTE — Progress Notes (Signed)
CSW continuing to follow.   CSW followed up with pt at bedside to discuss SNF bed offers.  Pt expressed that she wants a private room at [facility]. Pt has narrowed options to [facility], [facility], and [place]. Pt discussed that she will consider options and will have a decision for SNF in the morning. CSW notified pt that per MD, anticipate d/c tomorrow.   CSW to follow up with pt in AM regarding decision for SNF.   CSW to continue to follow.   Alison Murray, MSW, [place] Work [phone]

## 2015-07-08 NOTE — Progress Notes (Signed)
Pharmacy: Re-vancomycin  Patient's a 55 y.o F on vancomycin and zosyn day #4 for infected sacral ulcer. Pelvis CT on 1/22 showed fluid collection with suspicion for "tiny" abscess. CCS recommends to continue with current abx for now. Vancomycin trough level now back therapeutic at 13 (goal 10-15).  1/22 >> vanc >> 1/22 >> Zosyn >>    1/22 urine: multiple species present   Dose changes/levels: 1/25 VT at 1900 =  13 (on 750 mg q12h)   Plan: - continue vancomycin 750 mg IV q12h - zosyn 3.375 gm IV q8h (infuse over 4 hours)  Dia Sitter, PharmD, BCPS 07/08/2015 7:55 PM

## 2015-07-08 NOTE — Clinical Documentation Improvement (Signed)
Internal Medicine  Can the diagnosis of bedbound be further specified?   Function Quadriplegia, including suspected or known cause and/or associated condition(s)  Functional Quadriparesis, including suspected or known cause and/or associated condition(s)   Other  Clinically Undetermined  Document any associated diagnoses/conditions.  Please update your documentation within the medical record to reflect your response to this query. Thank you.  Supporting Information: (As per notes) "MS (multiple sclerosis) ; paraplegic"'  "Stage 4 sacral decubitus ulcer"  "- bed bound - has upper extermity weakness as well (mainly right sided)"  Please exercise your independent, professional judgment when responding. A specific answer is not anticipated or expected.  Thank You, Alessandra Grout, RN, BSN, CCDS,Clinical Documentation Specialist:  [phone]  [phone]=[facility] [place]- Health Information Management

## 2015-07-08 NOTE — Progress Notes (Signed)
[facility] PROGRESS NOTE    Progress Note   ATARI EDLEY [id number] DOB: Oct 22, 1960 DOA: 07/05/2015 PCP: [name], MD   Brief Narrative:   [name] is an 55 y.o. female past medical history of MS bedbound presents with sacral decubitus ulcer foul-smelling  Assessment/Plan:   Infected Sacral ulcer stage IV: Started empirically on IV vancomycin and Zosyn, has remained afebrile leukocytosis is improved. Wound care evaluated the patient and recommended chemical debridement and to wear bilateral pressure boot and a mitral replacement. CT [id number] dorsal soft tissue wound extending from tip of coccyx to the dermis. Another soft tissue wound is seen on the left overlying the ischial tuberosity extending to the dermis. In this latter wound there may be a 12 mm rim enhancing fluid collection, suggesting a tiny abscess. No underlying osseous abnormalities suggesting osteomyelitis. No acute abnormalities within the pelvis. I have discussed the case and CT findings with general surgery Dr.Tod Gerkin, he recommended continue abx and wound care center follow up at discharge.  Possible UTI: Started on on Zosyn awaiting urine cultures.  MS (multiple sclerosis) ([place]) Bedbound with upper extremity weakness.  DVT, lower extremity (HCC) Resume Xarelto.   Anemia of chronic disease: Anemia panel is pending, ferritin is probably unreliable.  Diabetes mellitus type 2, controlled ([place]); Continued leukocytes and sliding scale    DVT Prophylaxis - on Xarelto chronically for h/o DVT  Family Communication: none Disposition Plan: SNF when bed available  Code Status:     Code Status Orders        Start     Ordered   07/05/15 2232  Full code   Continuous     07/05/15 2232    Code Status History    Date Active Date Inactive Code Status Order ID Comments User Context   12/31/2014  3:59 PM 01/03/2015  5:50 PM Full Code [id number]  Nita Sells, MD Inpatient    12/14/2014  5:21 AM 12/21/2014  6:32 PM Full Code [id number]  Mariea Clonts, MD Inpatient   12/13/2014  8:13 PM 12/14/2014  5:21 AM Full Code [id number]  Nita Sells, MD Inpatient   06/25/2012 11:15 PM 06/30/2012  2:48 PM Full Code [id number]  Arlyss Repress, MD ED        IV Access:    Peripheral IV   Procedures and diagnostic studies:   No results found.   Medical Consultants:    None.  Anti-Infectives:   Anti-infectives    Start     Dose/Rate Route Frequency Ordered Stop   07/06/15 0800  vancomycin (VANCOCIN) IVPB 750 mg/150 ml premix     750 mg 150 mL/hr over 60 Minutes Intravenous Every 12 hours 07/05/15 1833     07/06/15 0400  piperacillin-tazobactam (ZOSYN) IVPB 3.375 g     3.375 g 12.5 mL/hr over 240 Minutes Intravenous Every 8 hours 07/05/15 1833     07/05/15 1815  vancomycin (VANCOCIN) IVPB 1000 mg/200 mL premix     1,000 mg 200 mL/hr over 60 Minutes Intravenous  Once 07/05/15 1813 07/05/15 2024   07/05/15 1815  piperacillin-tazobactam (ZOSYN) IVPB 3.375 g     3.375 g 100 mL/hr over 30 Minutes Intravenous  Once 07/05/15 1813 07/05/15 1910      Subjective:    [name] she has no new complaints.  Objective:    Filed Vitals:   07/07/15 0612 07/07/15 1322 07/07/15 2146 07/08/15 0623  BP: 101/58 102/56 98/52 100/59  Pulse: 78 90 94 87  Temp:  97.7 F (36.5 C) 98.6 F (37 C) 99.2 F (37.3 C) 98.2 F (36.8 C)  TempSrc: Oral Oral Oral Oral  Resp: 16 16 16 16   Height:      Weight: 67.9 kg (149 lb 11.1 oz)     SpO2: 100% 100% 99% 99%    Intake/Output Summary (Last 24 hours) at 07/08/15 1321 Last data filed at 07/08/15 [id number]  Gross per 24 hour  Intake    790 ml  Output   3000 ml  Net  -2210 ml   Filed Weights   07/05/15 1800 07/05/15 2222 07/07/15 0612  Weight: 67 kg (147 lb 11.3 oz) 64.9 kg (143 lb 1.3 oz) 67.9 kg (149 lb 11.1 oz)    Exam: Gen:  NAD Cardiovascular:  RRR. Chest and lungs:   CTAB Abdomen:  Abdomen soft, NT/ND, +  BS Extremities:  1+ edema Skin: I have inspeted the wound with the presence of patient's RN, agree with wound care note: Measurement:left ischial tuberosity (Stage 3): 3cm x 2cm x 1cm, red, moist. Right ischial tuberosity (Unstageable): 12.5cm x 2.5cm with soft, movable eschar in center obscuring depth. Sacral (stage 3): red, moist and measuring 1.5cm x 2cm x 0.5cm. Left posterior thigh: 2cm x 1cm x 0.2cm (Stage 2)   Data Reviewed:    Labs: Basic Metabolic Panel:  Recent Labs Lab 07/05/15 1750 07/06/15 0412  NA 144 142  K 4.1 3.7  CL 112* 111  CO2 25 25  GLUCOSE 168* 157*  BUN 21* 18  CREATININE 0.65 0.62  CALCIUM 10.3 9.4   GFR Estimated Creatinine Clearance: 73.5 mL/min (by C-G formula based on Cr of 0.62). Liver Function Tests:  Recent Labs Lab 07/05/15 1750  AST 12*  ALT 10*  ALKPHOS 52  BILITOT 0.5  PROT 7.5  ALBUMIN 2.9*   No results for input(s): LIPASE, AMYLASE in the last 168 hours. No results for input(s): AMMONIA in the last 168 hours. Coagulation profile  Recent Labs Lab 07/05/15 2318  INR 1.14    CBC:  Recent Labs Lab 07/05/15 1750 07/06/15 0412  WBC 11.4* 10.0  NEUTROABS 6.6  --   HGB 9.1* 7.9*  HCT 29.1* 24.8*  MCV 88.7 88.6  PLT 481* 414*   Cardiac Enzymes: No results for input(s): CKTOTAL, CKMB, CKMBINDEX, TROPONINI in the last 168 hours. BNP (last 3 results) No results for input(s): PROBNP in the last 8760 hours. CBG:  Recent Labs Lab 07/07/15 1149 07/07/15 1645 07/07/15 1750 07/07/15 2149 07/08/15 0747  GLUCAP 171* 58* 73 128* 134*   D-Dimer: No results for input(s): DDIMER in the last 72 hours. Hgb A1c: No results for input(s): HGBA1C in the last 72 hours. Lipid Profile: No results for input(s): CHOL, HDL, LDLCALC, TRIG, CHOLHDL, LDLDIRECT in the last 72 hours. Thyroid function studies: No results for input(s): TSH, T4TOTAL, T3FREE, THYROIDAB in the last 72 hours.  Invalid input(s): FREET3 Anemia work  up: No results for input(s): VITAMINB12, FOLATE, FERRITIN, TIBC, IRON, RETICCTPCT in the last 72 hours. Sepsis Labs:  Recent Labs Lab 07/05/15 1750 07/05/15 1801 07/06/15 0412 07/06/15 0825  WBC 11.4*  --  10.0  --   LATICACIDVEN  --  0.76  --  0.9   Microbiology Recent Results (from the past 240 hour(s))  Urine culture     Status: None   Collection Time: 07/05/15  7:51 PM  Result Value Ref Range Status   Specimen Description URINE, CATHETERIZED  Final   Special Requests NONE  Final  Culture   Final    MULTIPLE SPECIES PRESENT, SUGGEST RECOLLECTION Performed at [facility]    Report Status 07/07/2015 FINAL  Final     Medications:   . baclofen  10 mg Oral TID  . darifenacin  7.5 mg Oral Daily  . feeding supplement (PRO-STAT SUGAR FREE 64)  30 mL Oral BID  . gabapentin  100 mg Oral QHS  . glipiZIDE  5 mg Oral BID  . insulin aspart  0-9 Units Subcutaneous TID WC  . multivitamin with minerals  1 tablet Oral Daily  . omega-3 acid ethyl esters  1,000 mg Oral Daily  . piperacillin-tazobactam (ZOSYN)  IV  3.375 g Intravenous Q8H  . pravastatin  20 mg Oral q1800  . [START ON 07/09/2015] rivaroxaban  20 mg Oral Q supper  . sodium chloride  3 mL Intravenous Q12H  . vancomycin  750 mg Intravenous Q12H  . vitamin C  500 mg Oral Daily   Continuous Infusions:    Time spent: 25 min   LOS: 3 days   Herald Vallin MD PhD  [facility] Pager [phone]  *Please refer to [email or web address], password [email or web address] to get updated schedule on who will round on this patient, as hospitalists switch teams weekly. If 7PM-7AM, please contact night-coverage at [email or web address], password [email or web address] for any overnight needs.  07/08/2015, 1:21 PM

## 2015-07-08 NOTE — Progress Notes (Signed)
Pharmacy Antibiotic Follow-up Note  [name] is a 55 y.o. year-old female admitted on 07/05/2015.  The patient is currently on day 4 of Vancomycin / Zosyn for stage IV decubitus ulcers and possible UTI.   Assessment/Plan: Pt with stable renal function (though overestimated with bedbound status), is afebrile with WBC WNL.  Unclear duration of therapy of antibiotics.  CT pelvis negative for OM.  Check vancomycin trough tonight at [facility] if broad spectrum antibiotics continued.    Temp (24hrs), Avg:98.7 F (37.1 C), Min:98.2 F (36.8 C), Max:99.2 F (37.3 C)   Recent Labs Lab 07/05/15 1750 07/06/15 0412  WBC 11.4* 10.0    Recent Labs Lab 07/05/15 1750 07/06/15 0412  CREATININE 0.65 0.62   Estimated Creatinine Clearance: 73.5 mL/min (by C-G formula based on Cr of 0.62).    No Known Allergies  Antimicrobials this admission: 1/22 >> vanc >> 1/22 >> Zosyn >>    Levels/dose changes this admission: 1/25 Vancomycin trough @ 1930 = _________  Microbiology results: 1/22 urine: multiple species present  Thank you for allowing pharmacy to be a part of this patient's care.  Ralene Bathe, PharmD, BCPS 07/08/2015, 12:43 PM  Pager: [phone]

## 2015-07-08 NOTE — Care Management Important Message (Signed)
Important Message  Patient Details  Name: [name] MRN: [id number] Date of Birth: 04-05-1961   Medicare Important Message Given:  Yes    Camillo Flaming 07/08/2015, 10:04 AMImportant Message  Patient Details  Name: [name] MRN: [id number] Date of Birth: Jun 03, 1961   Medicare Important Message Given:  Yes    Camillo Flaming 07/08/2015, 10:04 AM

## 2015-07-09 DIAGNOSIS — G8220 Paraplegia, unspecified: Secondary | ICD-10-CM

## 2015-07-09 DIAGNOSIS — N181 Chronic kidney disease, stage 1: Secondary | ICD-10-CM

## 2015-07-09 DIAGNOSIS — R627 Adult failure to thrive: Secondary | ICD-10-CM

## 2015-07-09 DIAGNOSIS — E1122 Type 2 diabetes mellitus with diabetic chronic kidney disease: Secondary | ICD-10-CM

## 2015-07-09 LAB — CBC
HEMATOCRIT: 28.2 % — AB (ref 36.0–46.0)
HEMOGLOBIN: 8.9 g/dL — AB (ref 12.0–15.0)
MCH: 28.1 pg (ref 26.0–34.0)
MCHC: 31.6 g/dL (ref 30.0–36.0)
MCV: 89 fL (ref 78.0–100.0)
Platelets: 454 10*3/uL — ABNORMAL HIGH (ref 150–400)
RBC: 3.17 MIL/uL — AB (ref 3.87–5.11)
RDW: 17.2 % — AB (ref 11.5–15.5)
WBC: 11 10*3/uL — AB (ref 4.0–10.5)

## 2015-07-09 LAB — BASIC METABOLIC PANEL
ANION GAP: 7 (ref 5–15)
BUN: 11 mg/dL (ref 6–20)
CALCIUM: 9.7 mg/dL (ref 8.9–10.3)
CHLORIDE: 112 mmol/L — AB (ref 101–111)
CO2: 22 mmol/L (ref 22–32)
Creatinine, Ser: 0.67 mg/dL (ref 0.44–1.00)
GFR calc non Af Amer: >60 mL/min (ref >60)
Glucose, Bld: 67 mg/dL (ref 65–99)
Potassium: 3.9 mmol/L (ref 3.5–5.1)
Sodium: 141 mmol/L (ref 135–145)

## 2015-07-09 LAB — GLUCOSE, CAPILLARY
GLUCOSE-CAPILLARY: 134 mg/dL — AB (ref 65–99)
Glucose-Capillary: 68 mg/dL (ref 65–99)
Glucose-Capillary: 121 mg/dL — ABNORMAL HIGH (ref 65–99)
Glucose-Capillary: 163 mg/dL — ABNORMAL HIGH (ref 65–99)

## 2015-07-09 LAB — MAGNESIUM: Magnesium: 2 mg/dL (ref 1.7–2.4)

## 2015-07-09 MED ORDER — FAMOTIDINE 20 MG PO TABS: 20.0000 mg | ORAL_TABLET | Freq: Two times a day (BID) | ORAL | Status: DC

## 2015-07-09 MED ORDER — CIPROFLOXACIN HCL 500 MG PO TABS: 500.0000 mg | ORAL_TABLET | Freq: Two times a day (BID) | ORAL | Status: DC

## 2015-07-09 MED ORDER — HYDROCODONE-ACETAMINOPHEN 5-325 MG PO TABS: 1.0000 | ORAL_TABLET | Freq: Four times a day (QID) | ORAL | Status: DC | PRN

## 2015-07-09 MED ORDER — PRO-STAT SUGAR FREE PO LIQD: 30.0000 mL | Freq: Two times a day (BID) | ORAL | Status: DC

## 2015-07-09 MED ORDER — FUROSEMIDE 40 MG PO TABS: 40.0000 mg | ORAL_TABLET | Freq: Every day | ORAL | Status: DC | PRN

## 2015-07-09 MED ORDER — ADULT MULTIVITAMIN W/MINERALS CH: 1.0000 | ORAL_TABLET | Freq: Every day | ORAL | Status: DC

## 2015-07-09 NOTE — Progress Notes (Signed)
CSW continuing to follow.   CSW followed up with pt regarding decision for SNF.   Pt stated that she decided to go to [facility] for pt rehab needs. Pt states that [name] convenient for pt family to visit pt  CSW contacted [facility] and confirmed private room availability for today.  CSW to await MD to round on pt and facilitate pt discharge needs when pt medically ready.  Alison Murray, MSW, [place] Work [phone]

## 2015-07-09 NOTE — Discharge Summary (Signed)
Discharge Summary  [name] [id number] DOB: 12/26/1960  PCP: [name], MD  Admit date: 07/05/2015 Discharge date: 07/09/2015  Time spent: >45mins  Recommendations for Outpatient Follow-up:  1. Patient is discharged to SNF, continue wound care at SNF 2. Patient is to follow up with neurology for multiple scelerosis  Discharge Diagnoses:  Active Hospital Problems   Diagnosis Date Noted  . Stage 4 decubitus ulcer ([place]) 07/05/2015  . Chronic anemia 07/05/2015  . Diabetes mellitus type 2, controlled ([place]) 07/05/2015  . DVT, lower extremity ([place]) 12/28/2014  . MS (multiple sclerosis) [facility])     Resolved Hospital Problems   Diagnosis Date Noted Date Resolved  No resolved problems to display.    Discharge Condition: stable  Diet recommendation: heart healthy/carb modified  Filed Weights   07/05/15 1800 07/05/15 2222 07/07/15 0612  Weight: 67 kg (147 lb 11.3 oz) 64.9 kg (143 lb 1.3 oz) 67.9 kg (149 lb 11.1 oz)    History of present illness:  Foul odor from the sacral decubitus.  HPI: OMOLARA Roach is a 55 y.o. female with history of multiple sclerosis no longer on treatment with sacral decubitus was brought to the ER the patient found that patient was having found odor from the sacral decubitus. Patient has at least 3 ulcers stage IV with some slough on the sacral area. CT scan does not show any definite bony involvement. Patient at this time is being admitted for further management of her infected sacral decubitus ulcer. Patient also probably will need placement since patient has not much help at home. Patient is a poor historian. Patient states she may have missed some of her medications. Patient otherwise denies any nausea vomiting abdominal pain or diarrhea. Is usually bed bound bilateral lower extremity weakness.   Hospital Course:  Principal Problem:   Stage 4 decubitus ulcer (HCC) Active Problems:   MS (multiple sclerosis) (HCC)   DVT, lower extremity (HCC)   Chronic anemia   Diabetes mellitus type 2, controlled ([place])  Infected Sacral ulcer stage IV: Started empirically on IV vancomycin and Zosyn since admission, has remained afebrile, mild  leukocytosis is improved. Wound care evaluated the patient and recommended chemical debridement and to wear bilateral pressure boot  CT [id number] dorsal soft tissue wound extending from tip of coccyx to the dermis. Another soft tissue wound is seen on the left overlying the ischial tuberosity extending to the dermis. In this latter wound there may be a 12 mm rim enhancing fluid collection, suggesting a tiny abscess. No underlying osseous abnormalities suggesting osteomyelitis. No acute abnormalities within the pelvis.  Case and CT findings discussed with general surgery Dr.Tod Gerkin who recommended continue abx and wound care center follow up at discharge. Patient is to discharge to [facility] for wound  Care, will need continued wound care center follow up if discharged from SNF.   wound culture no growth, MRSA screen negative, patient is discharged with oral cipro and continued wound  Care at [facility].  Possible UTI:  Started on on Zosyn since admission,   urine cultures no growth. Patient has history of left UPJ stone, has been followed by urology Dr. Matilde Sprang. Currently no ab pain.   MS (multiple sclerosis) (HCC) Bedbound paraplegia bilateral lower extremity not able to lift against gravity, some right upper  extremity weakness, but able to feed self with left hand, outpatient neurology follow up  DVT, lower extremity ([place]) Resume Xarelto.  Anemia of chronic disease: H/h stable, no active bleed, no need of blood  transfusion during this hospitalization  Diabetes mellitus type 2, controlled ([place]); noninsulin dependent, continue diet control and metformin.    DVT Prophylaxis - on Xarelto chronically for h/o DVT  Family Communication: none Disposition Plan: SNF 1/26 Code Status: full           Procedures:  none  Consultations:  Phone conversation with general surgery Dr Filbert Berthold on 1/25  Discharge Exam: BP 98/76 mmHg  Pulse 70  Temp(Src) 98 F (36.7 C) (Oral)  Resp 18  Ht 5\' 6"  (1.676 m)  Wt 67.9 kg (149 lb 11.1 oz)  BMI 24.17 kg/m2  SpO2 98%  General: aaox3,  Cardiovascular: RRR, +murmur Respiratory: diminished at bases Ab: + bs, NT/ND Musculoskeletal: No Edema,. Chronic bilateral foot drop, on prevlon boots bilaterally  Neuro: chronic changes related to MS. (chronic paraplegia,bilateral lower extremity weakness, bedbound, right lower extremity weakness, able to feed self with left hand, neurogenic bladder), aaox3  Skin:I have inspeted the wound with the presence of patient's RN, agree with wound care note: Measurement:left ischial tuberosity (Stage 3): 3cm x 2cm x 1cm, red, moist. Right ischial tuberosity (Unstageable): 12.5cm x 2.5cm with soft, movable eschar in center obscuring depth. Sacral (stage 3): red, moist and measuring 1.5cm x 2cm x 0.5cm. Left posterior thigh: 2cm x 1cm x 0.2cm (Stage 2)   Discharge Instructions You were cared for by a hospitalist during your hospital stay. If you have any questions about your discharge medications or the care you received while you were in the hospital after you are discharged, you can call the unit and asked to speak with the hospitalist on call if the hospitalist that took care of you is not available. Once you are discharged, your primary care physician will handle any further medical issues. Please note that NO REFILLS for any discharge medications will be authorized once you are discharged, as it is imperative that you return to your primary care physician (or establish a relationship with a primary care physician if you do not have one) for your aftercare needs so that they can reassess your need for medications and monitor your lab values.  Discharge Instructions    Diet - low sodium heart healthy     Complete by:  As directed      Increase activity slowly    Complete by:  As directed             Medication List    STOP taking these medications        glipiZIDE 5 MG tablet  Commonly known as:  GLUCOTROL     iron polysaccharides 150 MG capsule  Commonly known as:  NIFEREX     lisinopril 2.5 MG tablet  Commonly known as:  PRINIVIL,ZESTRIL     pantoprazole 40 MG tablet  Commonly known as:  PROTONIX     potassium chloride 10 MEQ tablet  Commonly known as:  K-DUR      TAKE these medications        albuterol 108 (90 Base) MCG/ACT inhaler  Commonly known as:  PROVENTIL HFA;VENTOLIN HFA  Inhale 2 puffs into the lungs every 4 (four) hours as needed for wheezing or shortness of breath.     baclofen 10 MG tablet  Commonly known as:  LIORESAL  Take 10 mg by mouth 3 (three) times daily.     ciprofloxacin 500 MG tablet  Commonly known as:  CIPRO  Take 1 tablet (500 mg total) by mouth 2 (two) times daily.  famotidine 20 MG tablet  Commonly known as:  PEPCID  Take 1 tablet (20 mg total) by mouth 2 (two) times daily.     feeding supplement (PRO-STAT SUGAR FREE 64) Liqd  Take 30 mLs by mouth 2 (two) times daily.     furosemide 40 MG tablet  Commonly known as:  LASIX  Take 1 tablet (40 mg total) by mouth daily as needed for fluid or edema. Reported on 07/05/2015     gabapentin 100 MG capsule  Commonly known as:  NEURONTIN  Take 100 mg by mouth at bedtime.     HYDROcodone-acetaminophen 5-325 MG tablet  Commonly known as:  NORCO  Take 1 tablet by mouth every 6 (six) hours as needed for moderate pain.     lovastatin 20 MG tablet  Commonly known as:  MEVACOR  Take 20 mg by mouth daily.     metFORMIN 500 MG tablet  Commonly known as:  GLUCOPHAGE  Take 500 mg by mouth 2 (two) times daily.     multivitamin with minerals Tabs tablet  Take 1 tablet by mouth daily.     Omega 3 1000 MG Caps  Take 1,000 mg by mouth daily.     rivaroxaban 20 MG Tabs tablet  Commonly  known as:  XARELTO  Take 20 mg by mouth daily.     solifenacin 5 MG tablet  Commonly known as:  VESICARE  Take 5 mg by mouth daily.     vitamin C 500 MG tablet  Commonly known as:  ASCORBIC ACID  Take 500 mg by mouth daily.       No Known Allergies     Follow-up Information    Follow up with [facility].   Specialty:  [place]   Why:  continue wound care   Contact information:   [id number] [phone]      Follow up with Marcial Pacas, MD.   Specialty:  Neurology   Why:  multiple sclerosis   Contact information:   [address] [place] [address] [phone]        The results of significant diagnostics from this hospitalization (including imaging, microbiology, ancillary and laboratory) are listed below for reference.    Significant Diagnostic Studies: Ct Pelvis W Contrast  07/05/2015  CLINICAL DATA:  New decubitus wound, current history of multiple sclerosis, diabetes, immobility EXAM: CT PELVIS WITH CONTRAST TECHNIQUE: Multidetector CT imaging of the pelvis was performed using the standard protocol following the bolus administration of intravenous contrast. CONTRAST:  154mL OMNIPAQUE IOHEXOL 300 MG/ML  SOLN COMPARISON:  12/15/14 FINDINGS: Midline dorsal soft tissue wound extending from tip of coccyx to the dermis. Another soft tissue wound is seen on the left overlying the ischial tuberosity extending to the dermis. In this latter wound there may be a 12 mm rim enhancing fluid collection, suggesting a tiny abscess. No underlying osseous abnormalities suggesting osteomyelitis. No acute abnormalities within the pelvis. IMPRESSION: Evidence of 2 decubitus ulcers as described above with no findings to suggest osteomyelitis. Electronically Signed   By: Skipper Cliche M.D.   On: 07/05/2015 21:25    Microbiology: Recent Results (from the past 240 hour(s))  Urine culture     Status: None    Collection Time: 07/05/15  7:51 PM  Result Value Ref Range Status   Specimen Description URINE, CATHETERIZED  Final   Special Requests NONE  Final   Culture   Final  MULTIPLE SPECIES PRESENT, SUGGEST RECOLLECTION Performed at [facility]    Report Status 07/07/2015 FINAL  Final  MRSA PCR Screening     Status: None   Collection Time: 07/08/15  3:57 PM  Result Value Ref Range Status   MRSA by PCR NEGATIVE NEGATIVE Final    Comment:        The GeneXpert MRSA Assay (FDA approved for NASAL specimens only), is one component of a comprehensive MRSA colonization surveillance program. It is not intended to diagnose MRSA infection nor to guide or monitor treatment for MRSA infections.   Wound culture     Status: None (Preliminary result)   Collection Time: 07/08/15  4:51 PM  Result Value Ref Range Status   Specimen Description BUTTOCKS LEFT  Final   Special Requests NONE  Final   Gram Stain   Final    NO WBC SEEN NO SQUAMOUS EPITHELIAL CELLS SEEN NO ORGANISMS SEEN Performed at [facility]    Culture NO GROWTH Performed at [facility]   Final   Report Status PENDING  Incomplete     Labs: Basic Metabolic Panel:  Recent Labs Lab 07/05/15 1750 07/06/15 0412 07/09/15 0415  NA 144 142 141  K 4.1 3.7 3.9  CL 112* 111 112*  CO2 25 25 22   GLUCOSE 168* 157* 67  BUN 21* 18 11  CREATININE 0.65 0.62 0.67  CALCIUM 10.3 9.4 9.7  MG  --   --  2.0   Liver Function Tests:  Recent Labs Lab 07/05/15 1750  AST 12*  ALT 10*  ALKPHOS 52  BILITOT 0.5  PROT 7.5  ALBUMIN 2.9*   No results for input(s): LIPASE, AMYLASE in the last 168 hours. No results for input(s): AMMONIA in the last 168 hours. CBC:  Recent Labs Lab 07/05/15 1750 07/06/15 0412 07/09/15 0415  WBC 11.4* 10.0 11.0*  NEUTROABS 6.6  --   --   HGB 9.1* 7.9* 8.9*  HCT 29.1* 24.8* 28.2*  MCV 88.7 88.6 89.0  PLT 481* 414* 454*   Cardiac Enzymes: No results for input(s):  CKTOTAL, CKMB, CKMBINDEX, TROPONINI in the last 168 hours. BNP: BNP (last 3 results)  Recent Labs  12/13/14 1620  BNP 56.9    ProBNP (last 3 results) No results for input(s): PROBNP in the last 8760 hours.  CBG:  Recent Labs Lab 07/08/15 1203 07/08/15 1657 07/08/15 2133 07/09/15 0729 07/09/15 0747  GLUCAP 161* 107* 134* 68 121*       Signed:  Carlicia Leavens MD, PhD  [facility] 07/09/2015, 11:42 AM

## 2015-07-09 NOTE — Progress Notes (Signed)
Pt for discharge to [facility].  CSW facilitated pt discharge needs including contacting facility, faxing pt discharge information via [employer], discussing with pt at bedside, providing RN phone number to call report, and arranging ambulance transport scheduled for 1:45 pm pick up.   No further social work needs identified at this time.  CSW signing off.   Alison Murray, MSW, [place] [facility] [phone]

## 2015-07-09 NOTE — Clinical Social Work Placement (Signed)
   CLINICAL SOCIAL WORK PLACEMENT  NOTE  Date:  07/09/2015  Patient Details  Name: [name] MRN: [id number] Date of Birth: 1961/03/29  Clinical Social Work is seeking post-discharge placement for this patient at the [place] level of care (*CSW will initial, date and re-position this form in  chart as items are completed):  Yes   Patient/family provided with [facility] Work Department's list of facilities offering this level of care within the geographic area requested by the patient (or if unable, by the patient's family).  Yes   Patient/family informed of their freedom to choose among providers that offer the needed level of care, that participate in Medicare, Medicaid or managed care program needed by the patient, have an available bed and are willing to accept the patient.  Yes   Patient/family informed of [facility]'s ownership interest in [facility] and [facility], as well as of the fact that they are under no obligation to receive care at these facilities.  PASRR submitted to EDS on       PASRR number received on       Existing PASRR number confirmed on 07/07/15     FL2 transmitted to all facilities in geographic area requested by pt/family on 07/07/15     FL2 transmitted to all facilities within larger geographic area on       Patient informed that his/her managed care company has contracts with or will negotiate with certain facilities, including the following:        Yes   Patient/family informed of bed offers received.  Patient chooses bed at [place] recommends and patient chooses bed at      Patient to be transferred to [facility] on 07/09/15.  Patient to be transferred to facility by ambulance [facility])     Patient family notified on 07/09/15 of transfer.  Name of family member notified:  pt notified at bedside     PHYSICIAN Please sign FL2     Additional Comment:     _______________________________________________ [name], LCSW 07/09/2015, 12:22 PM

## 2015-07-09 NOTE — Clinical Social Work Placement (Signed)
   CLINICAL SOCIAL WORK PLACEMENT  NOTE  Date:  07/09/2015  Patient Details  Name: [name] MRN: [id number] Date of Birth: 11/16/1960  Clinical Social Work is seeking post-discharge placement for this patient at the [place] level of care (*CSW will initial, date and re-position this form in  chart as items are completed):  Yes   Patient/family provided with [facility] Work Department's list of facilities offering this level of care within the geographic area requested by the patient (or if unable, by the patient's family).  Yes   Patient/family informed of their freedom to choose among providers that offer the needed level of care, that participate in Medicare, Medicaid or managed care program needed by the patient, have an available bed and are willing to accept the patient.  Yes   Patient/family informed of [facility]'s ownership interest in [facility] and [facility], as well as of the fact that they are under no obligation to receive care at these facilities.  PASRR submitted to EDS on       PASRR number received on       Existing PASRR number confirmed on 07/07/15     FL2 transmitted to all facilities in geographic area requested by pt/family on 07/07/15     FL2 transmitted to all facilities within larger geographic area on       Patient informed that his/her managed care company has contracts with or will negotiate with certain facilities, including the following:        Yes   Patient/family informed of bed offers received.  Patient chooses bed at [place] recommends and patient chooses bed at      Patient to be transferred to [facility] on  .  Patient to be transferred to facility by       Patient family notified on   of transfer.  Name of family member notified:        PHYSICIAN Please sign FL2     Additional Comment:     _______________________________________________ [name], LCSW 07/09/2015, 9:43 AM

## 2015-07-09 NOTE — Progress Notes (Signed)
Patient discharged to [facility] by Ambulance, report called to [place].

## 2015-07-11 LAB — WOUND CULTURE
Culture: NO GROWTH
Gram Stain: NONE SEEN

## 2015-07-13 ENCOUNTER — Encounter: Payer: Self-pay | Admitting: Internal Medicine

## 2015-07-13 ENCOUNTER — Non-Acute Institutional Stay (SKILLED_NURSING_FACILITY): Payer: Medicare Other | Admitting: Internal Medicine

## 2015-07-13 DIAGNOSIS — N181 Chronic kidney disease, stage 1: Secondary | ICD-10-CM

## 2015-07-13 DIAGNOSIS — E1122 Type 2 diabetes mellitus with diabetic chronic kidney disease: Secondary | ICD-10-CM | POA: Diagnosis not present

## 2015-07-13 DIAGNOSIS — I82402 Acute embolism and thrombosis of unspecified deep veins of left lower extremity: Secondary | ICD-10-CM

## 2015-07-13 DIAGNOSIS — N319 Neuromuscular dysfunction of bladder, unspecified: Secondary | ICD-10-CM

## 2015-07-13 DIAGNOSIS — L8994 Pressure ulcer of unspecified site, stage 4: Secondary | ICD-10-CM | POA: Diagnosis not present

## 2015-07-13 DIAGNOSIS — D649 Anemia, unspecified: Secondary | ICD-10-CM | POA: Diagnosis not present

## 2015-07-13 DIAGNOSIS — R600 Localized edema: Secondary | ICD-10-CM | POA: Diagnosis not present

## 2015-07-13 DIAGNOSIS — G35 Multiple sclerosis: Secondary | ICD-10-CM | POA: Diagnosis not present

## 2015-07-13 LAB — LIPID PANEL
CHOLESTEROL: 143 mg/dL (ref 0–200)
HDL: 41 mg/dL (ref 35–70)
LDL CALC: 88 mg/dL
Triglycerides: 71 mg/dL (ref 40–160)

## 2015-07-13 LAB — HEPATIC FUNCTION PANEL
ALK PHOS: 46 U/L (ref 25–125)
ALT: 12 U/L (ref 7–35)
AST: 12 U/L — AB (ref 13–35)
Bilirubin, Total: 0.4 mg/dL

## 2015-07-13 LAB — BASIC METABOLIC PANEL
BUN: 15 mg/dL (ref 4–21)
CREATININE: 0.5 mg/dL (ref 0.5–1.1)
Glucose: 145 mg/dL
Potassium: 3.9 mmol/L (ref 3.4–5.3)
Sodium: 135 mmol/L — AB (ref 137–147)

## 2015-07-13 NOTE — Progress Notes (Signed)
Patient ID: [name], female   DOB: April 08, 1961, 55 y.o.   MRN: [id number]    HISTORY AND PHYSICAL   DATE: 07/13/15  Location:  [facility]    Place of Service: SNF (31)   Extended Emergency Contact Information Primary Emergency Contact: [place], [place] [address] [name] of [place] Phone: [phone] Mobile Phone: [phone] Relation: Relative Secondary Emergency Contact: [name], [place] [address] [place] of [place] Phone: [phone] Relation: Relative  Advanced Directive information  FULL CODE  Chief Complaint  Patient presents with  . New Admit To SNF    HPI:  55 yo female seen today as a new admission into SNF following  Hospital stay for infected stage 4 sacral wound, MS, hx DVTon xeralto, anemia of chronic disease, DM2, hyperlipidemia, chronic edmea, GERD, neurogenic bladder. She was tx with IV vanco and zosyn empirically --> cipro. Wound carre followed her inpt and recommended b/l pressure boot. CT pelvis revealed midline dorsal soft tissue wound at tip of coccyx -->dermis; similar area on left ischial tuberosity ---> dermis with 47m rim enhancing fluid collection s/o tiny abscess; no Osteomyelitis seen. Surgery was consulted and recommended abx and wound care center. She was transferred to SNF for further wound care. ALbumin 2.9. WBC 11K, hgb 8.9 and hct 28.2, Plt 454K at d/c.  She has no c/o. Pain is controlled with norco. She does not have a neurologist. She has gotten steadily weaker in right UE and can no longer feed herself with RUE and has taught herself to eat and write with left hand. No nursing issues. No falls. She is not wearing unna boots. Reports hx DVT but does not recall which leg. She is on xeralto. No bleeding. She is taking cipro for sacral wound infection   MS - takes neurontin, baclofen and HFA prn. She has a neurogenic bladder and a chronic indwelling foley cath  DM2 -  takes metformin. No low BS reactions.  Hyperlipidemia - stable on lovastatin  AOCD - stable Hgb at d/c 8.9  Edema - stable on lasix  GERD - stable on pepcid  Past Medical History  Diagnosis Date  . MS (multiple sclerosis) ([facility]   . Hyperglycemia   . Edema   . High blood pressure   . Diabetes mellitus without complication ([place]   . Pyelonephritis 12/16/2014    Past Surgical History  Procedure Laterality Date  . Hernia mesh removal    . Tubes tided    . Tubal ligation    . Esophagogastroduodenoscopy Left 01/02/2015    Procedure: ESOPHAGOGASTRODUODENOSCOPY (EGD);  Surgeon: WArta Silence MD;  [facility];  Service: Endoscopy;  Laterality: Left;    Patient Care Team: KWenda Low MD as PCP - General (Internal Medicine)  Social History   Social History  . Marital Status: Single    Spouse Name: N/A  . Number of Children: N/A  . Years of Education: N/A   Occupational History  . Not on file.   Social History Main Topics  . Smoking status: Former Smoker -- 1.00 packs/day for 20 years    Types: Cigarettes    Quit date: 11/19/2014  . Smokeless tobacco: Never Used  . Alcohol Use: 3.0 oz/week    5 Glasses of wine per week  . Drug Use: No  . Sexual Activity: Not Currently   Other Topics Concern  . 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              reports that she quit smoking about 7 months ago. Her smoking use included Cigarettes. She has a 20 pack-year smoking history. She has never used smokeless tobacco. She reports that she drinks about 3.0 oz of alcohol per week. She reports that she does not use illicit drugs.  Family History  Problem Relation Age of Onset  . Diabetes Mother   . Diabetes Father   . Diabetes Sister   . Scoliosis Sister   . Alzheimer's disease Mother    Family Status  Relation Status Death Age  .  Mother Alive   . Father Alive     Immunization History  Administered Date(s) Administered  . Influenza Split 06/26/2012  . PPD Test 01/04/2015    No Known Allergies  Medications: Patient's Medications  New Prescriptions   No medications on file  Previous Medications   ALBUTEROL (PROVENTIL HFA;VENTOLIN HFA) 108 (90 BASE) MCG/ACT INHALER    Inhale 2 puffs into the lungs every 4 (four) hours as needed for wheezing or shortness of breath.   AMINO ACIDS-PROTEIN HYDROLYS (FEEDING SUPPLEMENT, PRO-STAT SUGAR FREE 64,) LIQD    Take 30 mLs by mouth 2 (two) times daily.   BACLOFEN (LIORESAL) 10 MG TABLET    Take 10 mg by mouth 3 (three) times daily.    CIPROFLOXACIN (CIPRO) 500 MG TABLET    Take 1 tablet (500 mg total) by mouth 2 (two) times daily.   FAMOTIDINE (PEPCID) 20 MG TABLET    Take 1 tablet (20 mg total) by mouth 2 (two) times daily.   FUROSEMIDE (LASIX) 40 MG TABLET    Take 1 tablet (40 mg total) by mouth daily as needed for fluid or edema. Reported on 07/05/2015   GABAPENTIN (NEURONTIN) 100 MG CAPSULE    Take 100 mg by mouth at bedtime.   HYDROCODONE-ACETAMINOPHEN (NORCO) 5-325 MG TABLET    Take 1 tablet by mouth every 6 (six) hours as needed for moderate pain.   LOVASTATIN (MEVACOR) 20 MG TABLET    Take 20 mg by mouth daily.   METFORMIN (GLUCOPHAGE) 500 MG TABLET    Take 500 mg by mouth 2 (two) times daily.   MULTIPLE VITAMIN (MULTIVITAMIN WITH MINERALS) TABS TABLET    Take 1 tablet by mouth daily.   OMEGA 3 1000 MG CAPS    Take 1,000 mg by mouth daily.   RIVAROXABAN (XARELTO) 20 MG TABS TABLET    Take 20 mg by mouth daily.   SOLIFENACIN (VESICARE) 5 MG TABLET    Take 5 mg by mouth daily.   VITAMIN C (ASCORBIC ACID) 500 MG TABLET    Take 500 mg by mouth daily.  Modified Medications   No medications on file  Discontinued Medications   No medications on file    Review of Systems  HENT: Negative for trouble swallowing.   Genitourinary: Positive for difficulty urinating.    Musculoskeletal: Positive for gait problem (has an electric w/c at home).  Skin: Positive for wound.  Neurological: Positive for weakness.  All other systems reviewed and are negative.   Filed Vitals:   07/13/15 1252  BP: 113/77  Pulse: 86  Temp: 98 F (36.7 C)   There is no weight on file to calculate BMI.  Physical Exam  Constitutional: She is oriented to person, place, and time. She appears well-developed.  Frail appearing in NAD. Sitting up in bed   HENT:  Mouth/Throat: Oropharynx is clear and moist. No oropharyngeal exudate.  Eyes: Pupils are equal, round, and reactive to light. No scleral icterus.  Neck: Neck supple. Carotid bruit is not present. No tracheal deviation present. No thyromegaly present.  Cardiovascular: Regular rhythm, normal heart sounds and intact distal pulses.  Tachycardia present.  Exam reveals no gallop and no friction rub.   No murmur heard. +1 pitting LE edema b/l. No calf TTP  Pulmonary/Chest: Effort normal and breath sounds normal. No stridor. No respiratory distress. She has no wheezes. She has no rales.  Abdominal: Soft. Bowel sounds are normal. She exhibits no distension and no mass. There is no hepatomegaly. There is no tenderness. There is no rebound and no guarding.  Genitourinary:  Foley cath intact and DTG yellow clear urine  Lymphadenopathy:    She has no cervical adenopathy.  Neurological: She is alert and oriented to person, place, and time. She displays atrophy. Gait abnormal.  Reduced right grip strength  Skin: Skin is warm and dry. No rash noted.  Sacral dressing intact, dry blood.   Psychiatric: Her behavior is normal. Thought content normal. She exhibits a depressed mood.     Labs reviewed: Admission on 07/05/2015, Discharged on 07/09/2015  Component Date Value Ref Range Status  . WBC 07/05/2015 11.4* 4.0 - 10.5 K/uL Final  . RBC 07/05/2015 3.28* 3.87 - 5.11 MIL/uL Final  . Hemoglobin 07/05/2015 9.1* 12.0 - 15.0 g/dL Final   . HCT 07/05/2015 29.1* 36.0 - 46.0 % Final  . MCV 07/05/2015 88.7  78.0 - 100.0 fL Final  . MCH 07/05/2015 27.7  26.0 - 34.0 pg Final  . MCHC 07/05/2015 31.3  30.0 - 36.0 g/dL Final  . RDW 07/05/2015 17.2* 11.5 - 15.5 % Final  . Platelets 07/05/2015 481* 150 - 400 K/uL Final  . Neutrophils Relative % 07/05/2015 59   Final  . Neutro Abs 07/05/2015 6.6  1.7 - 7.7 K/uL Final  . Lymphocytes Relative 07/05/2015 30   Final  . Lymphs Abs 07/05/2015 3.5  0.7 - 4.0 K/uL Final  . Monocytes Relative 07/05/2015 10   Final  . Monocytes Absolute 07/05/2015 1.1* 0.1 - 1.0 K/uL Final  . Eosinophils Relative 07/05/2015 1   Final  . Eosinophils Absolute 07/05/2015 0.1  0.0 - 0.7 K/uL Final  . Basophils Relative 07/05/2015 0   Final  . Basophils Absolute 07/05/2015 0.1  0.0 - 0.1 K/uL Final  . Sodium 07/05/2015 144  135 - 145 mmol/L Final  . Potassium 07/05/2015 4.1  3.5 - 5.1 mmol/L Final  . Chloride 07/05/2015 112* 101 - 111 mmol/L Final  . CO2 07/05/2015 25  22 - 32 mmol/L Final  . Glucose, Bld 07/05/2015 168* 65 - 99 mg/dL Final  . BUN 07/05/2015 21* 6 - 20 mg/dL Final  . Creatinine, Ser 07/05/2015 0.65  0.44 - 1.00 mg/dL Final  . Calcium 07/05/2015 10.3  8.9 - 10.3 mg/dL Final  . Total Protein 07/05/2015 7.5  6.5 - 8.1 g/dL Final  . Albumin 07/05/2015 2.9* 3.5 - 5.0 g/dL Final  . AST 07/05/2015 12* 15 - 41 U/L Final  . ALT 07/05/2015 10* 14 - 54 U/L Final  . Alkaline Phosphatase 07/05/2015 52  38 - 126 U/L Final  . Total Bilirubin 07/05/2015 0.5  0.3 - 1.2 mg/dL Final  . GFR calc non Af  Amer 07/05/2015 >60  >60 mL/min Final  . GFR calc Af Amer 07/05/2015 >60  >60 mL/min Final   Comment: (NOTE) The eGFR has been calculated using the CKD EPI equation. This calculation has not been validated in all clinical situations. eGFR's persistently <60 mL/min signify possible Chronic Kidney Disease.   . Anion gap 07/05/2015 7  5 - 15 Final  . Lactic Acid, Venous 07/05/2015 0.76  0.5 - 2.0 mmol/L Final   . Specimen Description 07/05/2015 URINE, CATHETERIZED   Final  . Special Requests 07/05/2015 NONE   Final  . Culture 07/05/2015    Final                   Value:MULTIPLE SPECIES PRESENT, SUGGEST RECOLLECTION Performed at [facility]   . Report Status 07/05/2015 07/07/2015 FINAL   Final  . Color, Urine 07/05/2015 YELLOW  YELLOW Final  . APPearance 07/05/2015 TURBID* CLEAR Final  . Specific Gravity, Urine 07/05/2015 1.017  1.005 - 1.030 Final  . pH 07/05/2015 8.5* 5.0 - 8.0 Final  . Glucose, UA 07/05/2015 NEGATIVE  NEGATIVE mg/dL Final  . Hgb urine dipstick 07/05/2015 MODERATE* NEGATIVE Final  . Bilirubin Urine 07/05/2015 NEGATIVE  NEGATIVE Final  . Ketones, ur 07/05/2015 NEGATIVE  NEGATIVE mg/dL Final  . Protein, ur 07/05/2015 30* NEGATIVE mg/dL Final  . Nitrite 07/05/2015 POSITIVE* NEGATIVE Final  . Leukocytes, UA 07/05/2015 LARGE* NEGATIVE Final  . Squamous Epithelial / LPF 07/05/2015 0-5* NONE SEEN Final  . WBC, UA 07/05/2015 TOO NUMEROUS TO COUNT  0 - 5 WBC/hpf Final  . RBC / HPF 07/05/2015 6-30  0 - 5 RBC/hpf Final  . Bacteria, UA 07/05/2015 MANY* NONE SEEN Final  . Crystals 07/05/2015 TRIPLE PHOSPHATE CRYSTALS* NEGATIVE Final  . Sodium 07/06/2015 142  135 - 145 mmol/L Final  . Potassium 07/06/2015 3.7  3.5 - 5.1 mmol/L Final  . Chloride 07/06/2015 111  101 - 111 mmol/L Final  . CO2 07/06/2015 25  22 - 32 mmol/L Final  . Glucose, Bld 07/06/2015 157* 65 - 99 mg/dL Final  . BUN 07/06/2015 18  6 - 20 mg/dL Final  . Creatinine, Ser 07/06/2015 0.62  0.44 - 1.00 mg/dL Final  . Calcium 07/06/2015 9.4  8.9 - 10.3 mg/dL Final  . GFR calc non Af Amer 07/06/2015 >60  >60 mL/min Final  . GFR calc Af Amer 07/06/2015 >60  >60 mL/min Final   Comment: (NOTE) The eGFR has been calculated using the CKD EPI equation. This calculation has not been validated in all clinical situations. eGFR's persistently <60 mL/min signify possible Chronic Kidney Disease.   . Anion gap 07/06/2015 6   5 - 15 Final  . WBC 07/06/2015 10.0  4.0 - 10.5 K/uL Final  . RBC 07/06/2015 2.80* 3.87 - 5.11 MIL/uL Final  . Hemoglobin 07/06/2015 7.9* 12.0 - 15.0 g/dL Final  . HCT 07/06/2015 24.8* 36.0 - 46.0 % Final  . MCV 07/06/2015 88.6  78.0 - 100.0 fL Final  . MCH 07/06/2015 28.2  26.0 - 34.0 pg Final  . MCHC 07/06/2015 31.9  30.0 - 36.0 g/dL Final  . RDW 07/06/2015 17.2* 11.5 - 15.5 % Final  . Platelets 07/06/2015 414* 150 - 400 K/uL Final  . Heparin Unfractionated 07/05/2015 <0.10* 0.30 - 0.70 IU/mL Final   Comment:        IF HEPARIN RESULTS ARE BELOW EXPECTED VALUES, AND PATIENT DOSAGE HAS BEEN CONFIRMED, SUGGEST FOLLOW UP TESTING OF ANTITHROMBIN III LEVELS.   Marland Kitchen Prothrombin  Time 07/05/2015 14.8  11.6 - 15.2 seconds Final  . INR 07/05/2015 1.14  0.00 - 1.49 Final  . aPTT 07/05/2015 32  24 - 37 seconds Final  . Glucose-Capillary 07/05/2015 156* 65 - 99 mg/dL Final  . Heparin Unfractionated 07/06/2015 0.24* 0.30 - 0.70 IU/mL Final   Comment:        IF HEPARIN RESULTS ARE BELOW EXPECTED VALUES, AND PATIENT DOSAGE HAS BEEN CONFIRMED, SUGGEST FOLLOW UP TESTING OF ANTITHROMBIN III LEVELS.   . Lactic Acid, Venous 07/06/2015 0.9  0.5 - 2.0 mmol/L Final  . ABO/RH(D) 07/06/2015 A POS   Final  . Antibody Screen 07/06/2015 NEG   Final  . Sample Expiration 07/06/2015 07/09/2015   Final  . Glucose-Capillary 07/06/2015 143* 65 - 99 mg/dL Final  . Comment 1 07/06/2015 Notify RN   Final  . Comment 2 07/06/2015 Document in Chart   Final  . Glucose-Capillary 07/06/2015 191* 65 - 99 mg/dL Final  . Comment 1 07/06/2015 Notify RN   Final  . Comment 2 07/06/2015 Document in Chart   Final  . Heparin Unfractionated 07/06/2015 0.30  0.30 - 0.70 IU/mL Final   Comment:        IF HEPARIN RESULTS ARE BELOW EXPECTED VALUES, AND PATIENT DOSAGE HAS BEEN CONFIRMED, SUGGEST FOLLOW UP TESTING OF ANTITHROMBIN III LEVELS.   . Glucose-Capillary 07/06/2015 79  65 - 99 mg/dL Final  . Comment 1 07/06/2015 Notify  RN   Final  . Comment 2 07/06/2015 Document in Chart   Final  . Heparin Unfractionated 07/07/2015 0.39  0.30 - 0.70 IU/mL Final   Comment:        IF HEPARIN RESULTS ARE BELOW EXPECTED VALUES, AND PATIENT DOSAGE HAS BEEN CONFIRMED, SUGGEST FOLLOW UP TESTING OF ANTITHROMBIN III LEVELS.   . Glucose-Capillary 07/06/2015 106* 65 - 99 mg/dL Final  . Comment 1 07/06/2015 Notify RN   Final  . Heparin Unfractionated 07/07/2015 0.49  0.30 - 0.70 IU/mL Final   Comment:        IF HEPARIN RESULTS ARE BELOW EXPECTED VALUES, AND PATIENT DOSAGE HAS BEEN CONFIRMED, SUGGEST FOLLOW UP TESTING OF ANTITHROMBIN III LEVELS.   . Glucose-Capillary 07/07/2015 45* 65 - 99 mg/dL Final  . Comment 1 07/07/2015 Notify RN   Final  . Glucose-Capillary 07/07/2015 46* 65 - 99 mg/dL Final  . Comment 1 07/07/2015 Notify RN   Final  . Comment 2 07/07/2015 Document in Chart   Final  . Glucose-Capillary 07/07/2015 86  65 - 99 mg/dL Final  . Comment 1 07/07/2015 Notify RN   Final  . Comment 2 07/07/2015 Document in Chart   Final  . Glucose-Capillary 07/07/2015 171* 65 - 99 mg/dL Final  . Glucose-Capillary 07/07/2015 73  65 - 99 mg/dL Final  . Comment 1 07/07/2015 Notify RN   Final  . Comment 2 07/07/2015 Document in Chart   Final  . Glucose-Capillary 07/07/2015 58* 65 - 99 mg/dL Final  . Comment 1 07/07/2015 Notify RN   Final  . Comment 2 07/07/2015 Document in Chart   Final  . Glucose-Capillary 07/07/2015 128* 65 - 99 mg/dL Final  . Comment 1 07/07/2015 Notify RN   Final  . Glucose-Capillary 07/08/2015 134* 65 - 99 mg/dL Final  . Vancomycin Tr 07/08/2015 13  10.0 - 20.0 ug/mL Final  . MRSA by PCR 07/08/2015 NEGATIVE  NEGATIVE Final   Comment:        The GeneXpert MRSA Assay (FDA approved for NASAL specimens only), is one  component of a comprehensive MRSA colonization surveillance program. It is not intended to diagnose MRSA infection nor to guide or monitor treatment for MRSA infections.   Marland Kitchen Specimen  Description 07/08/2015 BUTTOCKS LEFT   Final  . Special Requests 07/08/2015 NONE   Final  . Gram Stain 07/08/2015    Final                   Value:NO WBC SEEN NO SQUAMOUS EPITHELIAL CELLS SEEN NO ORGANISMS SEEN Performed at [facility]   . Culture 07/08/2015    Final                   Value:NO GROWTH 2 DAYS Performed at [facility]   . Report Status 07/08/2015 07/11/2015 FINAL   Final  . Glucose-Capillary 07/08/2015 161* 65 - 99 mg/dL Final  . Glucose-Capillary 07/08/2015 107* 65 - 99 mg/dL Final  . WBC 07/09/2015 11.0* 4.0 - 10.5 K/uL Final  . RBC 07/09/2015 3.17* 3.87 - 5.11 MIL/uL Final  . Hemoglobin 07/09/2015 8.9* 12.0 - 15.0 g/dL Final  . HCT 07/09/2015 28.2* 36.0 - 46.0 % Final  . MCV 07/09/2015 89.0  78.0 - 100.0 fL Final  . MCH 07/09/2015 28.1  26.0 - 34.0 pg Final  . MCHC 07/09/2015 31.6  30.0 - 36.0 g/dL Final  . RDW 07/09/2015 17.2* 11.5 - 15.5 % Final  . Platelets 07/09/2015 454* 150 - 400 K/uL Final  . Sodium 07/09/2015 141  135 - 145 mmol/L Final  . Potassium 07/09/2015 3.9  3.5 - 5.1 mmol/L Final  . Chloride 07/09/2015 112* 101 - 111 mmol/L Final  . CO2 07/09/2015 22  22 - 32 mmol/L Final  . Glucose, Bld 07/09/2015 67  65 - 99 mg/dL Final  . BUN 07/09/2015 11  6 - 20 mg/dL Final  . Creatinine, Ser 07/09/2015 0.67  0.44 - 1.00 mg/dL Final  . Calcium 07/09/2015 9.7  8.9 - 10.3 mg/dL Final  . GFR calc non Af Amer 07/09/2015 >60  >60 mL/min Final  . GFR calc Af Amer 07/09/2015 >60  >60 mL/min Final   Comment: (NOTE) The eGFR has been calculated using the CKD EPI equation. This calculation has not been validated in all clinical situations. eGFR's persistently <60 mL/min signify possible Chronic Kidney Disease.   . Anion gap 07/09/2015 7  5 - 15 Final  . Magnesium 07/09/2015 2.0  1.7 - 2.4 mg/dL Final  . Glucose-Capillary 07/08/2015 134* 65 - 99 mg/dL Final  . Comment 1 07/08/2015 Notify RN   Final  . Glucose-Capillary 07/09/2015 68  65 - 99  mg/dL Final  . Comment 1 07/09/2015 Notify RN   Final  . Comment 2 07/09/2015 Document in Chart   Final  . Glucose-Capillary 07/09/2015 121* 65 - 99 mg/dL Final  . Glucose-Capillary 07/09/2015 163* 65 - 99 mg/dL Final  Admission on 06/01/2015, Discharged on 06/01/2015  Component Date Value Ref Range Status  . WBC 06/01/2015 12.7* 4.0 - 10.5 K/uL Final  . RBC 06/01/2015 3.06* 3.87 - 5.11 MIL/uL Final  . Hemoglobin 06/01/2015 8.8* 12.0 - 15.0 g/dL Final  . HCT 06/01/2015 26.7* 36.0 - 46.0 % Final  . MCV 06/01/2015 87.3  78.0 - 100.0 fL Final  . MCH 06/01/2015 28.8  26.0 - 34.0 pg Final  . MCHC 06/01/2015 33.0  30.0 - 36.0 g/dL Final  . RDW 06/01/2015 18.4* 11.5 - 15.5 % Final  . Platelets 06/01/2015 393  150 - 400 K/uL Final  .  Neutrophils Relative % 06/01/2015 58   Final  . Neutro Abs 06/01/2015 7.4  1.7 - 7.7 K/uL Final  . Lymphocytes Relative 06/01/2015 29   Final  . Lymphs Abs 06/01/2015 3.7  0.7 - 4.0 K/uL Final  . Monocytes Relative 06/01/2015 12   Final  . Monocytes Absolute 06/01/2015 1.5* 0.1 - 1.0 K/uL Final  . Eosinophils Relative 06/01/2015 1   Final  . Eosinophils Absolute 06/01/2015 0.1  0.0 - 0.7 K/uL Final  . Basophils Relative 06/01/2015 0   Final  . Basophils Absolute 06/01/2015 0.0  0.0 - 0.1 K/uL Final  . Sodium 06/01/2015 146* 135 - 145 mmol/L Final  . Potassium 06/01/2015 3.8  3.5 - 5.1 mmol/L Final  . Chloride 06/01/2015 116* 101 - 111 mmol/L Final  . CO2 06/01/2015 25  22 - 32 mmol/L Final  . Glucose, Bld 06/01/2015 82  65 - 99 mg/dL Final  . BUN 06/01/2015 15  6 - 20 mg/dL Final  . Creatinine, Ser 06/01/2015 0.53  0.44 - 1.00 mg/dL Final  . Calcium 06/01/2015 10.0  8.9 - 10.3 mg/dL Final  . Total Protein 06/01/2015 7.0  6.5 - 8.1 g/dL Final  . Albumin 06/01/2015 2.9* 3.5 - 5.0 g/dL Final  . AST 06/01/2015 12* 15 - 41 U/L Final  . ALT 06/01/2015 11* 14 - 54 U/L Final  . Alkaline Phosphatase 06/01/2015 47  38 - 126 U/L Final  . Total Bilirubin 06/01/2015  0.8  0.3 - 1.2 mg/dL Final  . GFR calc non Af Amer 06/01/2015 >60  >60 mL/min Final  . GFR calc Af Amer 06/01/2015 >60  >60 mL/min Final   Comment: (NOTE) The eGFR has been calculated using the CKD EPI equation. This calculation has not been validated in all clinical situations. eGFR's persistently <60 mL/min signify possible Chronic Kidney Disease.   . Anion gap 06/01/2015 5  5 - 15 Final  . Prothrombin Time 06/01/2015 14.4  11.6 - 15.2 seconds Final  . INR 06/01/2015 1.10  0.00 - 1.49 Final    Ct Pelvis W Contrast  07/05/2015  CLINICAL DATA:  New decubitus wound, current history of multiple sclerosis, diabetes, immobility EXAM: CT PELVIS WITH CONTRAST TECHNIQUE: Multidetector CT imaging of the pelvis was performed using the standard protocol following the bolus administration of intravenous contrast. CONTRAST:  156m OMNIPAQUE IOHEXOL 300 MG/ML  SOLN COMPARISON:  12/15/14 FINDINGS: Midline dorsal soft tissue wound extending from tip of coccyx to the dermis. Another soft tissue wound is seen on the left overlying the ischial tuberosity extending to the dermis. In this latter wound there may be a 12 mm rim enhancing fluid collection, suggesting a tiny abscess. No underlying osseous abnormalities suggesting osteomyelitis. No acute abnormalities within the pelvis. IMPRESSION: Evidence of 2 decubitus ulcers as described above with no findings to suggest osteomyelitis. Electronically Signed   By: RSkipper ClicheM.D.   On: 07/05/2015 21:25     Assessment/Plan   ICD-9-CM ICD-10-CM   1. Stage 4 decubitus ulcer ([place] - infected but no osteomyelitis 707.00 L89.94    707.24    2. Deep vein thrombosis (DVT) of left lower extremity, unspecified chronicity, unspecified vein (HCC) 453.40 I82.402   3. Bilateral leg edema 782.3 R60.0   4. Controlled type 2 diabetes mellitus with stage 1 chronic kidney disease, without long-term current use of insulin (HCC) 250.40 E11.22    585.1 N18.1   5. Chronic anemia  285.9 D64.9   6. MS (multiple sclerosis) ([place]  7. Neurogenic bladder 596.54 N31.9    s/p chronic foley cath    Get stop date on Cipro  Check A1c, BMP and CBC w diff  cont current meds as ordered  PT/OT/ST as indicated  Wound care to follow  Nutritional supplements as indicated  Foley cath care as indicated  GOAL: short term rehab and d/c home when medically appropriate. Communicated with pt and nursing.  Will follow  Kiylee Thoreson S. Perlie Gold  [facility] [address] [place], [place] [address] [phone] Cell (Monday-Friday 8 AM - 5 PM) [phone] After 5 PM and follow prompts

## 2015-07-15 LAB — BASIC METABOLIC PANEL
BUN: 19 mg/dL (ref 4–21)
CREATININE: 0.5 mg/dL (ref 0.5–1.1)
GLUCOSE: 118 mg/dL
POTASSIUM: 4 mmol/L (ref 3.4–5.3)
SODIUM: 137 mmol/L (ref 137–147)

## 2015-07-15 LAB — CBC AND DIFFERENTIAL
HCT: 28 % — AB (ref 36–46)
HEMOGLOBIN: 8.9 g/dL — AB (ref 12.0–16.0)
Platelets: 541 10*3/uL — AB (ref 150–399)
WBC: 12.9 10^3/mL

## 2015-07-20 ENCOUNTER — Inpatient Hospital Stay (HOSPITAL_COMMUNITY)
Admission: EM | Admit: 2015-07-20 | Discharge: 2015-07-23 | DRG: 177 | Disposition: A | Discharge status: Skilled Nursing Facility | Payer: Medicare Other | Attending: Internal Medicine | Admitting: Internal Medicine

## 2015-07-20 ENCOUNTER — Emergency Department (HOSPITAL_COMMUNITY): Payer: Medicare Other

## 2015-07-20 ENCOUNTER — Encounter (HOSPITAL_COMMUNITY): Payer: Self-pay | Admitting: Emergency Medicine

## 2015-07-20 DIAGNOSIS — N183 Chronic kidney disease, stage 3 unspecified: Secondary | ICD-10-CM | POA: Diagnosis present

## 2015-07-20 DIAGNOSIS — L89213 Pressure ulcer of right hip, stage 3: Secondary | ICD-10-CM | POA: Diagnosis present

## 2015-07-20 DIAGNOSIS — Z87891 Personal history of nicotine dependence: Secondary | ICD-10-CM | POA: Diagnosis not present

## 2015-07-20 DIAGNOSIS — N390 Urinary tract infection, site not specified: Secondary | ICD-10-CM | POA: Diagnosis present

## 2015-07-20 DIAGNOSIS — Z79899 Other long term (current) drug therapy: Secondary | ICD-10-CM

## 2015-07-20 DIAGNOSIS — E875 Hyperkalemia: Secondary | ICD-10-CM | POA: Diagnosis present

## 2015-07-20 DIAGNOSIS — L89612 Pressure ulcer of right heel, stage 2: Secondary | ICD-10-CM | POA: Diagnosis present

## 2015-07-20 DIAGNOSIS — D649 Anemia, unspecified: Secondary | ICD-10-CM | POA: Diagnosis not present

## 2015-07-20 DIAGNOSIS — L89223 Pressure ulcer of left hip, stage 3: Secondary | ICD-10-CM | POA: Diagnosis present

## 2015-07-20 DIAGNOSIS — E1122 Type 2 diabetes mellitus with diabetic chronic kidney disease: Secondary | ICD-10-CM | POA: Diagnosis not present

## 2015-07-20 DIAGNOSIS — N179 Acute kidney failure, unspecified: Secondary | ICD-10-CM | POA: Diagnosis present

## 2015-07-20 DIAGNOSIS — E785 Hyperlipidemia, unspecified: Secondary | ICD-10-CM | POA: Diagnosis present

## 2015-07-20 DIAGNOSIS — N320 Bladder-neck obstruction: Secondary | ICD-10-CM | POA: Diagnosis present

## 2015-07-20 DIAGNOSIS — J690 Pneumonitis due to inhalation of food and vomit: Principal | ICD-10-CM | POA: Diagnosis present

## 2015-07-20 DIAGNOSIS — T501X5A Adverse effect of loop [high-ceiling] diuretics, initial encounter: Secondary | ICD-10-CM | POA: Diagnosis present

## 2015-07-20 DIAGNOSIS — R4182 Altered mental status, unspecified: Secondary | ICD-10-CM | POA: Diagnosis present

## 2015-07-20 DIAGNOSIS — I890 Lymphedema, not elsewhere classified: Secondary | ICD-10-CM | POA: Diagnosis present

## 2015-07-20 DIAGNOSIS — G9340 Encephalopathy, unspecified: Secondary | ICD-10-CM | POA: Diagnosis not present

## 2015-07-20 DIAGNOSIS — E440 Moderate protein-calorie malnutrition: Secondary | ICD-10-CM | POA: Diagnosis present

## 2015-07-20 DIAGNOSIS — D638 Anemia in other chronic diseases classified elsewhere: Secondary | ICD-10-CM | POA: Diagnosis not present

## 2015-07-20 DIAGNOSIS — D631 Anemia in chronic kidney disease: Secondary | ICD-10-CM | POA: Diagnosis present

## 2015-07-20 DIAGNOSIS — Z993 Dependence on wheelchair: Secondary | ICD-10-CM | POA: Diagnosis not present

## 2015-07-20 DIAGNOSIS — N139 Obstructive and reflux uropathy, unspecified: Secondary | ICD-10-CM | POA: Diagnosis present

## 2015-07-20 DIAGNOSIS — R609 Edema, unspecified: Secondary | ICD-10-CM | POA: Diagnosis present

## 2015-07-20 DIAGNOSIS — E860 Dehydration: Secondary | ICD-10-CM | POA: Diagnosis present

## 2015-07-20 DIAGNOSIS — N319 Neuromuscular dysfunction of bladder, unspecified: Secondary | ICD-10-CM | POA: Diagnosis present

## 2015-07-20 DIAGNOSIS — E119 Type 2 diabetes mellitus without complications: Secondary | ICD-10-CM

## 2015-07-20 DIAGNOSIS — G35 Multiple sclerosis: Secondary | ICD-10-CM | POA: Diagnosis present

## 2015-07-20 DIAGNOSIS — Z86718 Personal history of other venous thrombosis and embolism: Secondary | ICD-10-CM

## 2015-07-20 DIAGNOSIS — N181 Chronic kidney disease, stage 1: Secondary | ICD-10-CM

## 2015-07-20 DIAGNOSIS — D72829 Elevated white blood cell count, unspecified: Secondary | ICD-10-CM | POA: Diagnosis present

## 2015-07-20 DIAGNOSIS — L8990 Pressure ulcer of unspecified site, unspecified stage: Secondary | ICD-10-CM

## 2015-07-20 DIAGNOSIS — I82409 Acute embolism and thrombosis of unspecified deep veins of unspecified lower extremity: Secondary | ICD-10-CM | POA: Diagnosis present

## 2015-07-20 DIAGNOSIS — L8994 Pressure ulcer of unspecified site, stage 4: Secondary | ICD-10-CM | POA: Diagnosis present

## 2015-07-20 DIAGNOSIS — I129 Hypertensive chronic kidney disease with stage 1 through stage 4 chronic kidney disease, or unspecified chronic kidney disease: Secondary | ICD-10-CM | POA: Diagnosis present

## 2015-07-20 LAB — CBC WITH DIFFERENTIAL/PLATELET
BASOS ABS: 0.1 10*3/uL (ref 0.0–0.1)
BASOS PCT: 0 %
EOS PCT: 3 %
Eosinophils Absolute: 0.4 10*3/uL (ref 0.0–0.7)
HCT: 29.8 % — ABNORMAL LOW (ref 36.0–46.0)
Hemoglobin: 9.7 g/dL — ABNORMAL LOW (ref 12.0–15.0)
LYMPHS PCT: 14 %
Lymphs Abs: 2.2 10*3/uL (ref 0.7–4.0)
MCH: 27.9 pg (ref 26.0–34.0)
MCHC: 32.6 g/dL (ref 30.0–36.0)
MCV: 85.6 fL (ref 78.0–100.0)
MONO ABS: 1.3 10*3/uL — AB (ref 0.1–1.0)
Monocytes Relative: 8 %
NEUTROS ABS: 11.8 10*3/uL — AB (ref 1.7–7.7)
Neutrophils Relative %: 75 %
Platelets: 666 10*3/uL — ABNORMAL HIGH (ref 150–400)
RBC: 3.48 MIL/uL — AB (ref 3.87–5.11)
RDW: 17.2 % — AB (ref 11.5–15.5)
WBC: 15.9 10*3/uL — AB (ref 4.0–10.5)

## 2015-07-20 LAB — COMPREHENSIVE METABOLIC PANEL
ALBUMIN: 3 g/dL — AB (ref 3.5–5.0)
ALK PHOS: 47 U/L (ref 38–126)
ALT: 14 U/L (ref 14–54)
AST: 13 U/L — AB (ref 15–41)
Anion gap: 12 (ref 5–15)
BILIRUBIN TOTAL: 0.5 mg/dL (ref 0.3–1.2)
BUN: 80 mg/dL — AB (ref 6–20)
CALCIUM: 10.1 mg/dL (ref 8.9–10.3)
CO2: 23 mmol/L (ref 22–32)
CREATININE: 3.82 mg/dL — AB (ref 0.44–1.00)
Chloride: 106 mmol/L (ref 101–111)
GFR calc Af Amer: 14 mL/min — ABNORMAL LOW (ref >60)
GFR, EST NON AFRICAN AMERICAN: 12 mL/min — AB (ref >60)
GLUCOSE: 114 mg/dL — AB (ref 65–99)
Potassium: 5.5 mmol/L — ABNORMAL HIGH (ref 3.5–5.1)
Sodium: 141 mmol/L (ref 135–145)
TOTAL PROTEIN: 8.1 g/dL (ref 6.5–8.1)

## 2015-07-20 LAB — RAPID URINE DRUG SCREEN, HOSP PERFORMED
AMPHETAMINES: NOT DETECTED
Barbiturates: NOT DETECTED
Benzodiazepines: NOT DETECTED
Cocaine: NOT DETECTED
OPIATES: POSITIVE — AB
TETRAHYDROCANNABINOL: NOT DETECTED

## 2015-07-20 LAB — URINALYSIS, ROUTINE W REFLEX MICROSCOPIC
BILIRUBIN URINE: NEGATIVE
GLUCOSE, UA: NEGATIVE mg/dL
Ketones, ur: NEGATIVE mg/dL
Nitrite: NEGATIVE
PH: 5.5 (ref 5.0–8.0)
Protein, ur: 30 mg/dL — AB
SPECIFIC GRAVITY, URINE: 1.013 (ref 1.005–1.030)

## 2015-07-20 LAB — TROPONIN I: Troponin I: <0.03 ng/mL (ref <0.031)

## 2015-07-20 LAB — SALICYLATE LEVEL: Salicylate Lvl: <4 mg/dL (ref 2.8–30.0)

## 2015-07-20 LAB — URINE MICROSCOPIC-ADD ON

## 2015-07-20 LAB — CBG MONITORING, ED: GLUCOSE-CAPILLARY: 102 mg/dL — AB (ref 65–99)

## 2015-07-20 LAB — ACETAMINOPHEN LEVEL

## 2015-07-20 LAB — GLUCOSE, CAPILLARY: GLUCOSE-CAPILLARY: 105 mg/dL — AB (ref 65–99)

## 2015-07-20 LAB — AMMONIA: AMMONIA: 21 umol/L (ref 9–35)

## 2015-07-20 LAB — MRSA PCR SCREENING: MRSA by PCR: NEGATIVE

## 2015-07-20 LAB — ETHANOL

## 2015-07-20 LAB — I-STAT CG4 LACTIC ACID, ED: Lactic Acid, Venous: 1.75 mmol/L (ref 0.5–2.0)

## 2015-07-20 MED ORDER — ACETAMINOPHEN 325 MG PO TABS
650.0000 mg | ORAL_TABLET | Freq: Four times a day (QID) | ORAL | Status: DC | PRN
  Administered 2015-07-22: 650 mg via ORAL
  Filled 2015-07-20: qty 2

## 2015-07-20 MED ORDER — HYDROCODONE-ACETAMINOPHEN 5-325 MG PO TABS
1.0000 | ORAL_TABLET | ORAL | Status: DC | PRN
  Administered 2015-07-21: 2 via ORAL
  Filled 2015-07-20: qty 2

## 2015-07-20 MED ORDER — SODIUM CHLORIDE 0.9% FLUSH
3.0000 mL | Freq: Two times a day (BID) | INTRAVENOUS | Status: DC
  Administered 2015-07-20 – 2015-07-23 (×4): 3 mL via INTRAVENOUS

## 2015-07-20 MED ORDER — DARIFENACIN HYDROBROMIDE ER 7.5 MG PO TB24
7.5000 mg | ORAL_TABLET | Freq: Every day | ORAL | Status: DC
  Administered 2015-07-21 – 2015-07-23 (×3): 7.5 mg via ORAL
  Filled 2015-07-20 (×3): qty 1

## 2015-07-20 MED ORDER — FAMOTIDINE 20 MG PO TABS
20.0000 mg | ORAL_TABLET | Freq: Two times a day (BID) | ORAL | Status: DC
  Administered 2015-07-20: 20 mg via ORAL
  Filled 2015-07-20 (×3): qty 1

## 2015-07-20 MED ORDER — PRAVASTATIN SODIUM 10 MG PO TABS
10.0000 mg | ORAL_TABLET | Freq: Every day | ORAL | Status: DC
Start: 2015-07-21 — End: 2015-07-23
  Administered 2015-07-21 – 2015-07-22 (×2): 10 mg via ORAL
  Filled 2015-07-20 (×3): qty 1

## 2015-07-20 MED ORDER — SODIUM CHLORIDE 0.9 % IV BOLUS (SEPSIS)
500.0000 mL | Freq: Once | INTRAVENOUS | Status: AC
  Administered 2015-07-20: 500 mL via INTRAVENOUS

## 2015-07-20 MED ORDER — BISACODYL 10 MG RE SUPP: 10.0000 mg | Freq: Every day | RECTAL | Status: DC | PRN

## 2015-07-20 MED ORDER — ACETAMINOPHEN 650 MG RE SUPP: 650.0000 mg | Freq: Four times a day (QID) | RECTAL | Status: DC | PRN

## 2015-07-20 MED ORDER — ENOXAPARIN SODIUM 80 MG/0.8ML ~~LOC~~ SOLN
1.0000 mg/kg | SUBCUTANEOUS | Status: DC
  Administered 2015-07-20: 70 mg via SUBCUTANEOUS
  Filled 2015-07-20 (×2): qty 0.8

## 2015-07-20 MED ORDER — ONDANSETRON HCL 4 MG/2ML IJ SOLN: 4.0000 mg | Freq: Four times a day (QID) | INTRAMUSCULAR | Status: DC | PRN

## 2015-07-20 MED ORDER — ALBUTEROL SULFATE (2.5 MG/3ML) 0.083% IN NEBU: 2.5000 mg | INHALATION_SOLUTION | RESPIRATORY_TRACT | Status: DC | PRN

## 2015-07-20 MED ORDER — CHLORHEXIDINE GLUCONATE 0.12 % MT SOLN
15.0000 mL | Freq: Two times a day (BID) | OROMUCOSAL | Status: DC
  Administered 2015-07-21 – 2015-07-23 (×6): 15 mL via OROMUCOSAL
  Filled 2015-07-20 (×7): qty 15

## 2015-07-20 MED ORDER — SODIUM CHLORIDE 0.9 % IV BOLUS (SEPSIS)
1000.0000 mL | Freq: Once | INTRAVENOUS | Status: AC
  Administered 2015-07-20: 1000 mL via INTRAVENOUS

## 2015-07-20 MED ORDER — BACLOFEN 10 MG PO TABS
10.0000 mg | ORAL_TABLET | Freq: Three times a day (TID) | ORAL | Status: DC
  Administered 2015-07-20 – 2015-07-23 (×8): 10 mg via ORAL
  Filled 2015-07-20 (×10): qty 1

## 2015-07-20 MED ORDER — CETYLPYRIDINIUM CHLORIDE 0.05 % MT LIQD
7.0000 mL | Freq: Two times a day (BID) | OROMUCOSAL | Status: DC
Start: 2015-07-21 — End: 2015-07-23
  Administered 2015-07-21 – 2015-07-23 (×5): 7 mL via OROMUCOSAL

## 2015-07-20 MED ORDER — ONDANSETRON HCL 4 MG PO TABS: 4.0000 mg | ORAL_TABLET | Freq: Four times a day (QID) | ORAL | Status: DC | PRN

## 2015-07-20 MED ORDER — INSULIN ASPART 100 UNIT/ML ~~LOC~~ SOLN
0.0000 [IU] | SUBCUTANEOUS | Status: DC
  Administered 2015-07-21: 2 [IU] via SUBCUTANEOUS
  Administered 2015-07-21: 1 [IU] via SUBCUTANEOUS
  Administered 2015-07-22 (×2): 3 [IU] via SUBCUTANEOUS
  Administered 2015-07-22: 1 [IU] via SUBCUTANEOUS
  Administered 2015-07-22: 3 [IU] via SUBCUTANEOUS
  Administered 2015-07-23: 1 [IU] via SUBCUTANEOUS
  Administered 2015-07-23 (×2): 2 [IU] via SUBCUTANEOUS

## 2015-07-20 MED ORDER — GABAPENTIN 100 MG PO CAPS
100.0000 mg | ORAL_CAPSULE | Freq: Every day | ORAL | Status: DC
  Administered 2015-07-20 – 2015-07-22 (×3): 100 mg via ORAL
  Filled 2015-07-20 (×4): qty 1

## 2015-07-20 MED ORDER — PRO-STAT SUGAR FREE PO LIQD
30.0000 mL | Freq: Two times a day (BID) | ORAL | Status: DC
  Administered 2015-07-20 – 2015-07-21 (×2): 30 mL via ORAL
  Filled 2015-07-20 (×3): qty 30

## 2015-07-20 MED ORDER — PIPERACILLIN-TAZOBACTAM 3.375 G IVPB 30 MIN: 3.3750 g | Freq: Once | INTRAVENOUS | Status: DC

## 2015-07-20 MED ORDER — SODIUM CHLORIDE 0.9 % IV SOLN
INTRAVENOUS | Status: AC
  Administered 2015-07-20 – 2015-07-21 (×2): via INTRAVENOUS

## 2015-07-20 NOTE — Progress Notes (Addendum)
Patient settled into room. O2 at 87%, placed on 2L [place]. Multiple skin issues including stage 2-3 pressure injuries to bilateral ischial tuberosities, moisture related skin breakdown to perineal/sacral area, and bilateral deep tissue injuries to heels. Dressings placed, heels elevated. WOC consult has been ordered. Patient seems to tolerate thin liquids fine. Meds were best swallowed with applesauce followed by water. Patient disoriented x4. Will continue to monitor.
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
## 2015-07-20 NOTE — ED Notes (Signed)
Pt has a 2.5 cm circular open pressure sore on R and L buttocks. No bleeding or drainage. Pt also has skin breakdown in rectal area.

## 2015-07-20 NOTE — H&P (Addendum)
PCP:  [name], [facility]  Referring provider [place] PA   Chief Complaint:  confusion  HPI: [name] is a 55 y.o. female   has a past medical history of MS (multiple sclerosis) ([place]); Hyperglycemia; Edema; High blood pressure; Diabetes mellitus without complication ([place]); and Pyelonephritis (12/16/2014).   Presented with worsening confusion patient was [facility]. Per notes last time she was seen at her baseline which is alert and oriented 4 and Megan Salon to walk or three days ago irregular staff apparently weekend staff was not aware of patient's change. She is no longer answering any questions in meaningful fashion patient at her baseline has profound weakness of the lower extremity as well as some weakness of upper extremities but usually able to feed herself and left hand this is secondary to history of multiple sclerosis. 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 Patient herself unable to provide any history   IN ER: On arrival patient was noted to have distended abdomen and decreased urine output. Foley was exchanged large amount of urine was obtained. Patient creatinine is up to 3.8 from baseline of 0.6. Potassium 5.5 white blood cell count was noted to be elevated at 15.9 UA showed large amount of red blood cells and some white blood cells but few bacteria only negative nitrites. ET tube the head showed no acute abnormality chest x-ray showed no evidence of cardiopulmonary disease.  Blood cultures were obtained, patient was afebrile, initial blood pressure 97/60 with administration of fluid bolus up to 122/68 lactic acid 1.75 Patient was given 1 L bolus   Regarding pertinent past history: Prior to January patient lived alone at home and was mobile with wheelchair and motorized chair.  She has history of Multiple sclerosis for which she is no longer receiving treatment she is bedbound at baseline and recently has been discharged to nursing home. She has neurogenic bladder with chronic indwelling 40 catheter She has known history of sacral decubitus ulcer status post recent admission secondary to infection discharge and to nursing home 126 of January 2017 CT scan at that time did not show bony involvement. She is discharging to nursing home with plan to follow-up at wound care. Wound culture at that time showed no growth MRSA screen was negative patient was discharge on Cipro. 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 She has chronic history of anemia and prior history of GI bleeding secondary to NSAIDS  currently been stable baseline hemoglobin is around 9. Patient has chronically low blood pressures with her baseline running in 0000000 systolic. Regarding patient's diabetes is well controlled on by mouth glipizide. She has history of chronic edema likely secondary to hyperalbuminemia. Her last albumin being 2.9  Hospitalist was called for admission for acute encephalopathy in a setting of acute renal failure due to urinary obstruction  Review of Systems: Unable to provide secondary to confusion  Constitutional: Past Medical History: Past Medical History  Diagnosis Date  . MS (multiple sclerosis) ([place])   . Hyperglycemia   . Edema   . High blood pressure   . Diabetes mellitus without complication ([place])   . Pyelonephritis 12/16/2014   Past Surgical History  Procedure Laterality Date  . Hernia mesh removal    . Tubes tided    . Tubal ligation    . Esophagogastroduodenoscopy Left 01/02/2015  Procedure: ESOPHAGOGASTRODUODENOSCOPY (EGD);  Surgeon: Arta Silence, MD;  Location: [facility];  Service: Endoscopy;  Laterality: Left;     Medications: Prior to Admission medications   Medication Sig Start Date End Date Taking? Authorizing Provider  albuterol  (PROVENTIL HFA;VENTOLIN HFA) 108 (90 BASE) MCG/ACT inhaler Inhale 2 puffs into the lungs every 4 (four) hours as needed for wheezing or shortness of breath. Patient taking differently: Inhale 2 puffs into the lungs every 4 (four) hours as needed for wheezing or shortness of breath. PROAIR 06/29/12  Yes [name], MD  Amino Acids-Protein Hydrolys (FEEDING SUPPLEMENT, PRO-STAT SUGAR FREE 64,) LIQD Take 30 mLs by mouth 2 (two) times daily. 07/09/15  Yes Florencia Reasons, MD  baclofen (LIORESAL) 10 MG tablet Take 10 mg by mouth 3 (three) times daily.    Yes Historical Provider, MD  bisacodyl (DULCOLAX) 10 MG suppository Place 10 mg rectally daily as needed for moderate constipation (only give 1 dose in 24 hours).   Yes Historical Provider, MD  ciprofloxacin (CIPRO) 500 MG tablet Take 1 tablet (500 mg total) by mouth 2 (two) times daily. 07/09/15  Yes Florencia Reasons, MD  famotidine (PEPCID) 20 MG tablet Take 1 tablet (20 mg total) by mouth 2 (two) times daily. 07/09/15  Yes Florencia Reasons, MD  furosemide (LASIX) 40 MG tablet Take 1 tablet (40 mg total) by mouth daily as needed for fluid or edema. Reported on 07/05/2015 07/09/15  Yes Florencia Reasons, MD  gabapentin (NEURONTIN) 100 MG capsule Take 100 mg by mouth at bedtime.   Yes Historical Provider, MD  HYDROcodone-acetaminophen (NORCO) 5-325 MG tablet Take 1 tablet by mouth every 6 (six) hours as needed for moderate pain. 07/09/15  Yes Florencia Reasons, MD  lovastatin (MEVACOR) 20 MG tablet Take 20 mg by mouth daily.   Yes Historical Provider, MD  magnesium hydroxide (MILK OF MAGNESIA) 400 MG/5ML suspension Take 30 mLs by mouth daily as needed for mild constipation or moderate constipation (only give 1 dose in 24 hours).   Yes Historical Provider, MD  metFORMIN (GLUCOPHAGE) 500 MG tablet Take 500 mg by mouth 2 (two) times daily. 12/08/14  Yes Historical Provider, MD  Multiple Vitamin (MULTIVITAMIN WITH MINERALS) TABS tablet Take 1 tablet by mouth daily. 07/09/15  Yes Florencia Reasons, MD  Nutritional  Supplements (NUTRITIONAL DRINK PO) Take 1 Container by mouth 2 (two) times daily. MED PASS   Yes Historical Provider, MD  Omega 3 1000 MG CAPS Take 1,000 mg by mouth daily.   Yes Historical Provider, MD  rivaroxaban (XARELTO) 20 MG TABS tablet Take 20 mg by mouth daily.   Yes Historical Provider, MD  Sodium Phosphates (RA SALINE ENEMA RE) Place 1 application rectally daily as needed (severe constipation, do not exceed 1 dose in 24 hours, if still no relief contact MD).   Yes Historical Provider, MD  solifenacin (VESICARE) 5 MG tablet Take 5 mg by mouth daily.   Yes Historical Provider, MD  vitamin C (ASCORBIC ACID) 500 MG tablet Take 500 mg by mouth daily.   Yes Historical Provider, MD    Allergies:  No Known Allergies  Social History:  Ambulatory  wheelchair bound    From facility [facility]   reports that she quit smoking about 7 months ago. Her smoking use included Cigarettes. She has a 20 pack-year smoking history. She has never used smokeless tobacco. She reports that she drinks about 3.0 oz of alcohol per week. She reports that she does not use illicit drugs.  Family History: family history includes Alzheimer's disease in her mother; Diabetes in her father, mother, and sister; Scoliosis in her sister.    Physical Exam: Patient Vitals for the past 24 hrs:  BP Temp Temp src Pulse Resp SpO2  07/20/15 1717 122/68 mmHg 98.5 F (36.9 C) Oral 96 14 100 %  07/20/15 1554 97/60 mmHg 98.5 F (36.9 C) Oral 88 16 98 %    1. General:  in No Acute distress 2. Psychological: Alert not Oriented 3. Head/ENT:   Dry Mucous Membranes                          Head Non traumatic, neck supple                            Poor Dentition 4. SKIN:   decreased Skin turgor,  Skin clean Dry and intact no rash decubitus ulcers on the buttocks    5. Heart: Regular rate and rhythm no Murmur, Rub or gallop 6. Lungs:  no wheezes or crackles   7. Abdomen: Soft, non-tender, Non distended, indwelling  Foley present dark urine present 8. Lower extremities: no clubbing, cyanosis, or edema 9. Neurologically Grossly intact, moving all 4 extremities equally 10. MSK: Normal range of motion  body mass index is unknown because there is no weight on file.   Labs on Admission:   Results for orders placed or performed during the hospital encounter of 07/20/15 (from the past 24 hour(s))  Urinalysis, Routine w reflex microscopic     Status: Abnormal   Collection Time: 07/20/15  5:09 PM  Result Value Ref Range   Color, Urine YELLOW YELLOW   APPearance CLOUDY (A) CLEAR   Specific Gravity, Urine 1.013 1.005 - 1.030   pH 5.5 5.0 - 8.0   Glucose, UA NEGATIVE NEGATIVE mg/dL   Hgb urine dipstick LARGE (A) NEGATIVE   Bilirubin Urine NEGATIVE NEGATIVE   Ketones, ur NEGATIVE NEGATIVE mg/dL   Protein, ur 30 (A) NEGATIVE mg/dL   Nitrite NEGATIVE NEGATIVE   Leukocytes, UA SMALL (A) NEGATIVE  Urine microscopic-add on     Status: Abnormal   Collection Time: 07/20/15  5:09 PM  Result Value Ref Range   Squamous Epithelial / LPF 0-5 (A) NONE SEEN   WBC, UA 6-30 0 - 5 WBC/hpf   RBC / HPF TOO NUMEROUS TO COUNT 0 - 5 RBC/hpf   Bacteria, UA FEW (A) NONE SEEN   Crystals CA OXALATE CRYSTALS (A) NEGATIVE   Urine-Other MUCOUS PRESENT   Urine rapid drug screen (hosp performed)     Status: Abnormal   Collection Time: 07/20/15  5:10 PM  Result Value Ref Range   Opiates POSITIVE (A) NONE DETECTED   Cocaine NONE DETECTED NONE DETECTED   Benzodiazepines NONE DETECTED NONE DETECTED   Amphetamines NONE DETECTED NONE DETECTED   Tetrahydrocannabinol NONE DETECTED NONE DETECTED   Barbiturates NONE DETECTED NONE DETECTED  CBC with Differential     Status: Abnormal   Collection Time: 07/20/15  5:12 PM  Result Value Ref Range   WBC 15.9 (H) 4.0 - 10.5 K/uL   RBC 3.48 (L) 3.87 - 5.11 MIL/uL   Hemoglobin 9.7 (L) 12.0 - 15.0 g/dL   HCT 29.8 (L) 36.0 - 46.0 %   MCV 85.6 78.0 - 100.0 fL   MCH 27.9 26.0 - 34.0 pg   MCHC  32.6 30.0 - 36.0 g/dL   RDW 17.2 (  H) 11.5 - 15.5 %   Platelets 666 (H) 150 - 400 K/uL   Neutrophils Relative % 75 %   Neutro Abs 11.8 (H) 1.7 - 7.7 K/uL   Lymphocytes Relative 14 %   Lymphs Abs 2.2 0.7 - 4.0 K/uL   Monocytes Relative 8 %   Monocytes Absolute 1.3 (H) 0.1 - 1.0 K/uL   Eosinophils Relative 3 %   Eosinophils Absolute 0.4 0.0 - 0.7 K/uL   Basophils Relative 0 %   Basophils Absolute 0.1 0.0 - 0.1 K/uL  Comprehensive metabolic panel     Status: Abnormal   Collection Time: 07/20/15  5:12 PM  Result Value Ref Range   Sodium 141 135 - 145 mmol/L   Potassium 5.5 (H) 3.5 - 5.1 mmol/L   Chloride 106 101 - 111 mmol/L   CO2 23 22 - 32 mmol/L   Glucose, Bld 114 (H) 65 - 99 mg/dL   BUN 80 (H) 6 - 20 mg/dL   Creatinine, Ser 3.82 (H) 0.44 - 1.00 mg/dL   Calcium 10.1 8.9 - 10.3 mg/dL   Total Protein 8.1 6.5 - 8.1 g/dL   Albumin 3.0 (L) 3.5 - 5.0 g/dL   AST 13 (L) 15 - 41 U/L   ALT 14 14 - 54 U/L   Alkaline Phosphatase 47 38 - 126 U/L   Total Bilirubin 0.5 0.3 - 1.2 mg/dL   GFR calc non Af Amer 12 (L) >60 mL/min   GFR calc Af Amer 14 (L) >60 mL/min   Anion gap 12 5 - 15  Ammonia     Status: None   Collection Time: 07/20/15  5:12 PM  Result Value Ref Range   Ammonia 21 9 - 35 umol/L  Troponin I     Status: None   Collection Time: 07/20/15  5:12 PM  Result Value Ref Range   Troponin I <0.03 XX123456 ng/mL  Salicylate level     Status: None   Collection Time: 07/20/15  5:23 PM  Result Value Ref Range   Salicylate Lvl 123456 2.8 - 30.0 mg/dL  Acetaminophen level     Status: Abnormal   Collection Time: 07/20/15  5:23 PM  Result Value Ref Range   Acetaminophen (Tylenol), Serum <10 (L) 10 - 30 ug/mL  Ethanol     Status: None   Collection Time: 07/20/15  5:23 PM  Result Value Ref Range   Alcohol, Ethyl (B) <5 <5 mg/dL  I-Stat CG4 Lactic Acid, ED     Status: None   Collection Time: 07/20/15  5:25 PM  Result Value Ref Range   Lactic Acid, Venous 1.75 0.5 - 2.0 mmol/L  CBG  monitoring, ED     Status: Abnormal   Collection Time: 07/20/15  6:30 PM  Result Value Ref Range   Glucose-Capillary 102 (H) 65 - 99 mg/dL    UA no evidence of UTI evidence of blood in urine  Lab Results  Component Value Date   HGBA1C 7.4* 06/26/2012    Estimated Creatinine Clearance: 15.4 mL/min (by C-G formula based on Cr of 3.82).  BNP (last 3 results) No results for input(s): PROBNP in the last 8760 hours.  Other results:  I have pearsonaly reviewed this: ECG REPORT  Rate: 96  Rhythm: Sinus rhythm possible rate atrial enlargement ST&T Change: No ischemic change QTC 420  There were no vitals filed for this visit.   Cultures:    Component Value Date/Time   SDES BUTTOCKS LEFT 07/08/2015 1651   SPECREQUEST NONE  07/08/2015 1651   CULT  07/08/2015 1651    NO GROWTH 2 DAYS Performed at [facility] 07/11/2015 FINAL 07/08/2015 1651     Radiological Exams on Admission: Dg Chest 1 View  07/20/2015  CLINICAL DATA:  Altered mental status. Currently uncooperative and combative. Being treated for multiple decubitus ulcers and has a Foley catheter. History of hypertension and diabetes. EXAM: CHEST 1 VIEW COMPARISON:  Chest x-rays dated 12/14/2014 and 06/25/2012. FINDINGS: Study is hypoinspiratory causing crowding of the bronchovascular and interstitial markings. Given the slightly low lung volumes, lungs are clear. No confluent opacity to suggest a developing pneumonia. No pleural effusions seen. No pneumothorax. Heart size is normal. Overall cardiomediastinal silhouette is stable in size and configuration. Osseous and soft tissue structures about the chest are unremarkable. IMPRESSION: Slightly hypoinspiratory exam.  No active disease. Electronically Signed   By: Franki Cabot M.D.   On: 07/20/2015 16:41   Ct Head Wo Contrast  07/20/2015  CLINICAL DATA:  Altered mental status. EXAM: CT HEAD WITHOUT CONTRAST TECHNIQUE: Contiguous axial images were obtained from  the base of the skull through the vertex without intravenous contrast. COMPARISON:  01/18/2010 FINDINGS: Sinuses/Soft tissues: Mild motion degradation throughout. Clear paranasal sinuses and mastoid air cells. Intracranial: Age advanced cerebral atrophy. Mild low density in the periventricular white matter likely related to small vessel disease. No mass lesion, hemorrhage, hydrocephalus, acute infarct, intra-axial, or extra-axial fluid collection. IMPRESSION: 1.  No acute intracranial abnormality. 2. Mild motion degradation. 3.  Cerebral atrophy and small vessel ischemic change. Electronically Signed   By: Abigail Miyamoto M.D.   On: 07/20/2015 16:57    Chart has been reviewed  Family not  at  Bedside    Assessment/Plan  55 year old female with history of multiple sclerosis wheelchair-bound at baseline status post indwelling Foley catheter secondary to urinary retention presents with poorly draining bladder resulting in acute renal failure and acute encephalopathy  Present on Admission:   . Acute renal failure ([place]) in a setting of urinary retention likely secondary to bladder outlet obstruction due to malfunctioning Foley will continue to monitor urine function if does not improve will need nephrology consult . Obtain renal ultrasound  . Acute encephalopathy - likely multifactorial in the setting of acute renal failure, and dehydration CT scan of the head unremarkable. If no improvement despite rehydration further imaging could be warranted, will order speech pathology evaluation to make sure patient not at risk for aspiration  . Urinary outflow obstruction - only has been exchange currently appears to be functioning well  . Chronic anemia - currently baseline continue to monitor  . Pressure ulcer - wound care consult, continue Cipro . Multiple sclerosis diagnosed 2002-not on Therapy any longer currently stable continue to monitor. Acute encephalopathy less likely secondary to multiple sclerosis .  We'll have PT OT evaluation prior to discharge to make sure patient goes to appropriate level of care History of DVT - usually on Xarelto but will change to Lovenox per pharmacy while decreased renal function Hematuria possibly secondary to Foley manipulation of does not resolve will need to further investigation Diabetes well controlled will hold by mouth medications and order sliding scale Leukocytosis - at this point patient afebrile does not appear to be toxic. Does not meet sepsis criteria.  Hold off on antibiotics decubitus ulcers appear to be healing well   Prophylaxis: xarelto on hold while acute renal failure will change to Lovenox  CODE STATUS:  FULL CODE as per prior  paperwork   Disposition:                            Back to current facility when stable                           Other plan as per orders.  I have spent a total of 67min on this admission    Amalie Koran 07/20/2015, 8:40 PM    [facility]  Pager [phone]   after 2 AM please page floor coverage PA If 7AM-7PM, please contact the day team taking care of the patient  [email or web address]  Password [email or web address]

## 2015-07-20 NOTE — ED Notes (Signed)
Per EMS. Pt from [facility]. Per staff pt is normally A+O 4x and ambulatory with a walker. When weekday staff returned today the noticed pt has altered mental status. Pt will not answer questions, will only yell out. Last seen normal on Friday. Pt is being treated for multiple decubitus ulcers and has a foley catheter.

## 2015-07-20 NOTE — Clinical Social Work Note (Signed)
Clinical Social Work Assessment  Patient Details  Name: [name] MRN: [id number] Date of Birth: September 05, 1960  Date of referral:  07/20/15               Reason for consult:   (Patient is from facility.)                Permission sought to share information with:   (None.) Permission granted to share information::   (NONE.)  Name::        Agency::     Relationship::     Contact Information:     Housing/Transportation Living arrangements for the past 2 months:  [place] (Patient is from [place] of Information:   (Notes, per chart. Patient was asleep. CSW spoke wtih nurse tech who states patient was not effectively communicative but was only yelling earlier.) Patient Interpreter Needed:  None Criminal Activity/Legal Involvement Pertinent to Current Situation/Hospitalization:  No - Comment as needed Significant Relationships:   (Uknown.) Lives with:  Facility Resident [facility].) Do you feel safe going back to the place where you live?   (Uknown. CSW spoke with nurse tech who states that patient was not clean upon coming to [facility]. ) Need for family participation in patient care:     Care giving concerns:  CSW spoke with Nurse Tech who states patient was filthy upon arrival. Patient is from facility. Per note, patient is usually alert and oriented. However, staff noticed a change in patient's mental status.   Social Worker assessment / plan:  CSW attempted to speak with patient at bedside. Patient was in deep sleep.  Per note, upon arrival patient would not follow commands or speak with staff. Patient would only yell. There was no family present. Nurse was present along with nurse tech. Nurse Tech states patient's appearance was not well groomed. Patient appears to look significantly older than her age.   Per note, patient is usually alert and oriented but staff noticed altered mental status. Per note, patient is ambulatory with walker.       Employment  status:    Insurance information:   [employer].) PT Recommendations:    Information / Referral to community resources:   (CSW will provide patient with [facility].)  Patient/Family's Response to care:  Patient is not effectively communicative at this time. Patient was in a deep sleep. CSW was told patient was unresponsive verbally and would only yell.  Patient/Family's Understanding of and Emotional Response to Diagnosis, Current Treatment, and Prognosis:  Patient asleep.  Emotional Assessment Appearance:  Appears older than stated age Attitude/Demeanor/Rapport:   (Patient was asleep.) Affect (typically observed):   (Asleep.) Orientation:   (Per note, patient is usually alert and oriented. However, staff noticed a change in patient's mental status.) Alcohol / Substance use:  Not Applicable Psych involvement (Current and /or in the community):  No (Comment)  Discharge Needs  Concerns to be addressed:  Adjustment to Illness Readmission within the last 30 days:  No Current discharge risk:  None Barriers to Discharge:  No Barriers Identified   Bernita Buffy, LCSW 07/20/2015, 8:04 PM

## 2015-07-20 NOTE — ED Provider Notes (Signed)
CSN: [id number]     Arrival date & time 07/20/15  1539 History   First MD Initiated Contact with Patient 07/20/15 1553     Chief Complaint  Patient presents with  . Altered Mental Status     (Consider location/radiation/quality/duration/timing/severity/associated sxs/prior Treatment) The history is limited by the condition of the patient.    [name] is a 55 y.o. female  with a PMH of multiple sclerosis, HTN, DM who presents to the Emergency Department from [facility] living and rehab for altered mental status. Per nursing staff, pt. Is typically A&O x 4, however when weekday workers came in today, they noticed change from baseline - last known normal of Friday afternoon when weekday workers left. Was admitted on 1/22 for decubitus ulcers - according to [facility] from facility, taking ABX as directed.  Upon exam, patient will not respond to questions or follow commands. Only yelling out "please stay" repetitively and "please stop" when examining prior wounds.   Level 5 caveat applies due to mental status  Past Medical History  Diagnosis Date  . MS (multiple sclerosis) ([place])   . Hyperglycemia   . Edema   . High blood pressure   . Diabetes mellitus without complication ([place])   . Pyelonephritis 12/16/2014   Past Surgical History  Procedure Laterality Date  . Hernia mesh removal    . Tubes tided    . Tubal ligation    . Esophagogastroduodenoscopy Left 01/02/2015    Procedure: ESOPHAGOGASTRODUODENOSCOPY (EGD);  Surgeon: Arta Silence, MD;  Location: [facility];  Service: Endoscopy;  Laterality: Left;   Family History  Problem Relation Age of Onset  . Diabetes Mother   . Diabetes Father   . Diabetes Sister   . Scoliosis Sister   . Alzheimer's disease Mother    Social History  Substance Use Topics  . Smoking status: Former Smoker -- 1.00 packs/day for 20 years    Types: Cigarettes    Quit date: 11/19/2014  . Smokeless tobacco: Never Used  . Alcohol Use: 3.0 oz/week    5  Glasses of wine per week   OB History    No data available     Review of Systems  Unable to perform ROS: Mental status change      Allergies  Review of patient's allergies indicates no known allergies.  Home Medications   Prior to Admission medications   Medication Sig Start Date End Date Taking? Authorizing Provider  albuterol (PROVENTIL HFA;VENTOLIN HFA) 108 (90 BASE) MCG/ACT inhaler Inhale 2 puffs into the lungs every 4 (four) hours as needed for wheezing or shortness of breath. Patient taking differently: Inhale 2 puffs into the lungs every 4 (four) hours as needed for wheezing or shortness of breath. PROAIR 06/29/12  Yes [name], MD  Amino Acids-Protein Hydrolys (FEEDING SUPPLEMENT, PRO-STAT SUGAR FREE 64,) LIQD Take 30 mLs by mouth 2 (two) times daily. 07/09/15  Yes Florencia Reasons, MD  baclofen (LIORESAL) 10 MG tablet Take 10 mg by mouth 3 (three) times daily.    Yes Historical Provider, MD  bisacodyl (DULCOLAX) 10 MG suppository Place 10 mg rectally daily as needed for moderate constipation (only give 1 dose in 24 hours).   Yes Historical Provider, MD  ciprofloxacin (CIPRO) 500 MG tablet Take 1 tablet (500 mg total) by mouth 2 (two) times daily. 07/09/15  Yes Florencia Reasons, MD  famotidine (PEPCID) 20 MG tablet Take 1 tablet (20 mg total) by mouth 2 (two) times daily. 07/09/15  Yes Florencia Reasons, MD  furosemide (LASIX) 40 MG tablet Take 1 tablet (40 mg total) by mouth daily as needed for fluid or edema. Reported on 07/05/2015 07/09/15  Yes Florencia Reasons, MD  gabapentin (NEURONTIN) 100 MG capsule Take 100 mg by mouth at bedtime.   Yes Historical Provider, MD  HYDROcodone-acetaminophen (NORCO) 5-325 MG tablet Take 1 tablet by mouth every 6 (six) hours as needed for moderate pain. 07/09/15  Yes Florencia Reasons, MD  lovastatin (MEVACOR) 20 MG tablet Take 20 mg by mouth daily.   Yes Historical Provider, MD  magnesium hydroxide (MILK OF MAGNESIA) 400 MG/5ML suspension Take 30 mLs by mouth daily as needed for mild  constipation or moderate constipation (only give 1 dose in 24 hours).   Yes Historical Provider, MD  metFORMIN (GLUCOPHAGE) 500 MG tablet Take 500 mg by mouth 2 (two) times daily. 12/08/14  Yes Historical Provider, MD  Multiple Vitamin (MULTIVITAMIN WITH MINERALS) TABS tablet Take 1 tablet by mouth daily. 07/09/15  Yes Florencia Reasons, MD  Nutritional Supplements (NUTRITIONAL DRINK PO) Take 1 Container by mouth 2 (two) times daily. MED PASS   Yes Historical Provider, MD  Omega 3 1000 MG CAPS Take 1,000 mg by mouth daily.   Yes Historical Provider, MD  rivaroxaban (XARELTO) 20 MG TABS tablet Take 20 mg by mouth daily.   Yes Historical Provider, MD  Sodium Phosphates (RA SALINE ENEMA RE) Place 1 application rectally daily as needed (severe constipation, do not exceed 1 dose in 24 hours, if still no relief contact MD).   Yes Historical Provider, MD  solifenacin (VESICARE) 5 MG tablet Take 5 mg by mouth daily.   Yes Historical Provider, MD  vitamin C (ASCORBIC ACID) 500 MG tablet Take 500 mg by mouth daily.   Yes Historical Provider, MD   BP 97/60 mmHg  Pulse 88  Temp(Src) 98.5 F (36.9 C) (Oral)  Resp 16  SpO2 98% Physical Exam  Constitutional: She appears well-developed and well-nourished.  HENT:  Head: Normocephalic and atraumatic.  Eyes: Pupils are equal, round, and reactive to light.  Cardiovascular: Normal rate, regular rhythm and normal heart sounds.  Exam reveals no gallop and no friction rub.   No murmur heard. Pulmonary/Chest: Effort normal and breath sounds normal. No respiratory distress. She has no wheezes. She has no rales. She exhibits no tenderness.  Abdominal: She exhibits no mass. There is no tenderness. There is no rebound and no guarding.  Suprapubic distention  Genitourinary:  Foley in place  Musculoskeletal: She exhibits no edema.  Neurological:  Pt. Unable to follow commands, limiting examination.  Pt. Not responding to questions including name, time, place.   Skin: Skin is  warm and dry.  Healing decubitus ulcers without surrounding erythema. No purulent drainage.   Psychiatric: She has a normal mood and affect. Her behavior is normal. Judgment and thought content normal.  Nursing note and vitals reviewed.   ED Course  Procedures (including critical care time) Labs Review Labs Reviewed  CULTURE, BLOOD (ROUTINE X 2)  CULTURE, BLOOD (ROUTINE X 2)  CBC WITH DIFFERENTIAL/PLATELET  COMPREHENSIVE METABOLIC PANEL  AMMONIA  TROPONIN I  URINALYSIS, ROUTINE W REFLEX MICROSCOPIC (NOT AT [facility])  URINE RAPID DRUG SCREEN, HOSP PERFORMED  SALICYLATE LEVEL  ACETAMINOPHEN LEVEL  ETHANOL  CBG MONITORING, ED  I-STAT CG4 LACTIC ACID, ED    Imaging Review Dg Chest 1 View  07/20/2015  CLINICAL DATA:  Altered mental status. Currently uncooperative and combative. Being treated for multiple decubitus ulcers and has a Foley catheter. History  of hypertension and diabetes. EXAM: CHEST 1 VIEW COMPARISON:  Chest x-rays dated 12/14/2014 and 06/25/2012. FINDINGS: Study is hypoinspiratory causing crowding of the bronchovascular and interstitial markings. Given the slightly low lung volumes, lungs are clear. No confluent opacity to suggest a developing pneumonia. No pleural effusions seen. No pneumothorax. Heart size is normal. Overall cardiomediastinal silhouette is stable in size and configuration. Osseous and soft tissue structures about the chest are unremarkable. IMPRESSION: Slightly hypoinspiratory exam.  No active disease. Electronically Signed   By: Franki Cabot M.D.   On: 07/20/2015 16:41   I have personally reviewed and evaluated these images and lab results as part of my medical decision-making.   EKG Interpretation None      MDM   Final diagnoses:  Altered mental status   [name] presents from facility with altered mental status. On exam, patient with suprapubic distention and foley in place. Bladder scan showed > 900 - new foley placed.   Labs: See above.  WBC of 15.9, K+ 5.5; Cr 3.82 (baseline around 0.6)  Imaging: CXR with no effusions, PNX, PNA - no active dz. CT head with no acute abnormality.  Therapeutics: IV fluids  Consulted medicine, Dr. Roel Cluck, who will admit for further evaluation and management.   Patient seen by and discussed with Dr. Johnney Killian who agrees with treatment plan.   [facility] Ward, PA-C 07/20/15 2014  Charlesetta Shanks, MD 07/28/15 1425

## 2015-07-20 NOTE — Progress Notes (Signed)
ANTICOAGULATION CONSULT NOTE - Initial Consult  Pharmacy Consult for Lovenox Indication: h/o DVT  No Known Allergies  Patient Measurements:   Heparin Dosing Weight:   Vital Signs: Temp: 98.5 F (36.9 C) (02/06 1717) Temp Source: Oral (02/06 1717) BP: 111/52 mmHg (02/06 2015) Pulse Rate: 95 (02/06 2015)  Labs:  Recent Labs  07/20/15 1712  HGB 9.7*  HCT 29.8*  PLT 666*  CREATININE 3.82*  TROPONINI <0.03    Estimated Creatinine Clearance: 15.4 mL/min (by C-G formula based on Cr of 3.82).   Medical History: Past Medical History  Diagnosis Date  . MS (multiple sclerosis) ([place])   . Hyperglycemia   . Edema   . High blood pressure   . Diabetes mellitus without complication ([place])   . Pyelonephritis 12/16/2014    Assessment: 67 yoF with MS admitted from NH with confusion and AKI likely 2/2 bladder outlet obstruction due to malfunctioning Foley.  Patient is on Xarelto PTA for history of DVT but due to current AKI, MD wishes to change to Lovenox per pharmacy until renal function improved.  Last dose of Xarelto 20 mg daily documented 2/5 at 2100.  CBC: Hgb 9.7, platelets elevated CrCl~17 ml/min  Goal of Therapy:  Anti-Xa level 0.6-1 units/ml 4hrs after LMWH dose given Monitor platelets by anticoagulation protocol: Yes   Plan:  Lovenox 70 mg (1 mg/kg) SQ q24h.  Will start this evening 24 hours after last Xarelto dose. F/u SCr, CBC.  Hershal Coria 07/20/2015,9:27 PM

## 2015-07-20 NOTE — ED Notes (Signed)
Patient came from facility. While changing foley catheter out to a new one this tech observed multiple skin wounds on patients backside. Large amounts of dead skin all over. When cleaning patient up, patients skin would slough off onto wash rag. When adjusting patient up in bed this tech also observed that patients hair was matted and there is an indention around patients hair line where patients bonnet is at. Oral care on patient has not been done in a while. There was food dried to patients mouth and debris on patients teeth. Continuing to clean up patient this tech also had to clean and soak patients vaginal area. The vaginal area was also matted and covered in a thick white substance. Patient appeared and smelled like she had not be bathed in a while. This tech reported this information to the EDP, RN, and Arts development officer and Social Work.

## 2015-07-20 NOTE — ED Notes (Signed)
IV RN at bedside 

## 2015-07-21 ENCOUNTER — Inpatient Hospital Stay (HOSPITAL_COMMUNITY): Payer: Medicare Other

## 2015-07-21 ENCOUNTER — Encounter (HOSPITAL_COMMUNITY): Payer: Self-pay | Admitting: Internal Medicine

## 2015-07-21 DIAGNOSIS — N183 Chronic kidney disease, stage 3 unspecified: Secondary | ICD-10-CM

## 2015-07-21 DIAGNOSIS — E440 Moderate protein-calorie malnutrition: Secondary | ICD-10-CM | POA: Insufficient documentation

## 2015-07-21 DIAGNOSIS — N390 Urinary tract infection, site not specified: Secondary | ICD-10-CM | POA: Diagnosis present

## 2015-07-21 DIAGNOSIS — D638 Anemia in other chronic diseases classified elsewhere: Secondary | ICD-10-CM

## 2015-07-21 DIAGNOSIS — L8994 Pressure ulcer of unspecified site, stage 4: Secondary | ICD-10-CM

## 2015-07-21 DIAGNOSIS — N179 Acute kidney failure, unspecified: Secondary | ICD-10-CM | POA: Diagnosis present

## 2015-07-21 DIAGNOSIS — D649 Anemia, unspecified: Secondary | ICD-10-CM | POA: Diagnosis present

## 2015-07-21 DIAGNOSIS — D72829 Elevated white blood cell count, unspecified: Secondary | ICD-10-CM

## 2015-07-21 DIAGNOSIS — I82409 Acute embolism and thrombosis of unspecified deep veins of unspecified lower extremity: Secondary | ICD-10-CM

## 2015-07-21 DIAGNOSIS — E875 Hyperkalemia: Secondary | ICD-10-CM | POA: Diagnosis present

## 2015-07-21 HISTORY — DX: Acute kidney failure, unspecified: N17.9

## 2015-07-21 HISTORY — DX: Moderate protein-calorie malnutrition: E44.0

## 2015-07-21 HISTORY — DX: Chronic kidney disease, stage 3 unspecified: N18.30

## 2015-07-21 LAB — CBC
HCT: 25.7 % — ABNORMAL LOW (ref 36.0–46.0)
Hemoglobin: 8.1 g/dL — ABNORMAL LOW (ref 12.0–15.0)
MCH: 27.8 pg (ref 26.0–34.0)
MCHC: 31.5 g/dL (ref 30.0–36.0)
MCV: 88.3 fL (ref 78.0–100.0)
PLATELETS: 646 10*3/uL — AB (ref 150–400)
RBC: 2.91 MIL/uL — AB (ref 3.87–5.11)
RDW: 17.7 % — ABNORMAL HIGH (ref 11.5–15.5)
WBC: 11.5 10*3/uL — AB (ref 4.0–10.5)

## 2015-07-21 LAB — PREALBUMIN: PREALBUMIN: 13.9 mg/dL — AB (ref 18–38)

## 2015-07-21 LAB — COMPREHENSIVE METABOLIC PANEL
ALK PHOS: 40 U/L (ref 38–126)
ALT: 10 U/L — AB (ref 14–54)
AST: 11 U/L — ABNORMAL LOW (ref 15–41)
Albumin: 2.5 g/dL — ABNORMAL LOW (ref 3.5–5.0)
Anion gap: 9 (ref 5–15)
BILIRUBIN TOTAL: 0.3 mg/dL (ref 0.3–1.2)
BUN: 72 mg/dL — ABNORMAL HIGH (ref 6–20)
CALCIUM: 9.2 mg/dL (ref 8.9–10.3)
CO2: 21 mmol/L — ABNORMAL LOW (ref 22–32)
CREATININE: 3.24 mg/dL — AB (ref 0.44–1.00)
Chloride: 113 mmol/L — ABNORMAL HIGH (ref 101–111)
GFR, EST AFRICAN AMERICAN: 17 mL/min — AB (ref >60)
GFR, EST NON AFRICAN AMERICAN: 15 mL/min — AB (ref >60)
Glucose, Bld: 104 mg/dL — ABNORMAL HIGH (ref 65–99)
Potassium: 4.5 mmol/L (ref 3.5–5.1)
Sodium: 143 mmol/L (ref 135–145)
Total Protein: 6.6 g/dL (ref 6.5–8.1)

## 2015-07-21 LAB — URINE CULTURE: CULTURE: NO GROWTH

## 2015-07-21 LAB — BASIC METABOLIC PANEL
ANION GAP: 9 (ref 5–15)
BUN: 79 mg/dL — ABNORMAL HIGH (ref 6–20)
CHLORIDE: 112 mmol/L — AB (ref 101–111)
CO2: 21 mmol/L — AB (ref 22–32)
CREATININE: 3.66 mg/dL — AB (ref 0.44–1.00)
Calcium: 9.2 mg/dL (ref 8.9–10.3)
GFR calc non Af Amer: 13 mL/min — ABNORMAL LOW (ref >60)
GFR, EST AFRICAN AMERICAN: 15 mL/min — AB (ref >60)
Glucose, Bld: 122 mg/dL — ABNORMAL HIGH (ref 65–99)
POTASSIUM: 4.7 mmol/L (ref 3.5–5.1)
SODIUM: 142 mmol/L (ref 135–145)

## 2015-07-21 LAB — MAGNESIUM: Magnesium: 2.3 mg/dL (ref 1.7–2.4)

## 2015-07-21 LAB — TROPONIN I: Troponin I: <0.03 ng/mL (ref <0.031)

## 2015-07-21 LAB — GLUCOSE, CAPILLARY
GLUCOSE-CAPILLARY: 135 mg/dL — AB (ref 65–99)
GLUCOSE-CAPILLARY: 143 mg/dL — AB (ref 65–99)
GLUCOSE-CAPILLARY: 97 mg/dL (ref 65–99)
Glucose-Capillary: 94 mg/dL (ref 65–99)
Glucose-Capillary: 106 mg/dL — ABNORMAL HIGH (ref 65–99)
Glucose-Capillary: 151 mg/dL — ABNORMAL HIGH (ref 65–99)

## 2015-07-21 LAB — PHOSPHORUS: Phosphorus: 5 mg/dL — ABNORMAL HIGH (ref 2.5–4.6)

## 2015-07-21 LAB — TSH: TSH: 1.787 u[IU]/mL (ref 0.350–4.500)

## 2015-07-21 LAB — SODIUM, URINE, RANDOM: SODIUM UR: 69 mmol/L

## 2015-07-21 LAB — CREATININE, URINE, RANDOM: Creatinine, Urine: 40.84 mg/dL

## 2015-07-21 MED ORDER — DEXTROSE 5 % IV SOLN
1.0000 g | INTRAVENOUS | Status: DC
  Administered 2015-07-21 – 2015-07-22 (×2): 1 g via INTRAVENOUS
  Filled 2015-07-21 (×3): qty 10

## 2015-07-21 MED ORDER — RIVAROXABAN 20 MG PO TABS
20.0000 mg | ORAL_TABLET | Freq: Every day | ORAL | Status: DC
  Administered 2015-07-21: 20 mg via ORAL
  Filled 2015-07-21: qty 1

## 2015-07-21 MED ORDER — OMEGA 3 1000 MG PO CAPS
1000.0000 mg | ORAL_CAPSULE | Freq: Every day | ORAL | Status: DC
  Administered 2015-07-21 – 2015-07-23 (×3): 1000 mg via ORAL
  Filled 2015-07-21 (×3): qty 1

## 2015-07-21 MED ORDER — ENSURE ENLIVE PO LIQD
237.0000 mL | Freq: Two times a day (BID) | ORAL | Status: DC
  Administered 2015-07-22 – 2015-07-23 (×4): 237 mL via ORAL

## 2015-07-21 MED ORDER — HYDROCERIN EX CREA
TOPICAL_CREAM | Freq: Two times a day (BID) | CUTANEOUS | Status: DC
  Administered 2015-07-21: 12:00:00 via TOPICAL
  Administered 2015-07-21: 1 via TOPICAL
  Administered 2015-07-22 – 2015-07-23 (×3): via TOPICAL
  Filled 2015-07-21: qty 113

## 2015-07-21 MED ORDER — FAMOTIDINE 20 MG PO TABS
20.0000 mg | ORAL_TABLET | Freq: Every day | ORAL | Status: DC
  Administered 2015-07-21 – 2015-07-22 (×2): 20 mg via ORAL
  Filled 2015-07-21 (×3): qty 1

## 2015-07-21 MED ORDER — PRO-STAT SUGAR FREE PO LIQD
30.0000 mL | Freq: Every day | ORAL | Status: DC
  Administered 2015-07-22 – 2015-07-23 (×2): 30 mL via ORAL
  Filled 2015-07-21 (×2): qty 30

## 2015-07-21 MED ORDER — METFORMIN HCL 500 MG PO TABS: 500.0000 mg | ORAL_TABLET | Freq: Two times a day (BID) | ORAL | Status: DC

## 2015-07-21 MED ORDER — ENOXAPARIN SODIUM 80 MG/0.8ML ~~LOC~~ SOLN
70.0000 mg | SUBCUTANEOUS | Status: DC
  Administered 2015-07-22: 70 mg via SUBCUTANEOUS
  Filled 2015-07-21 (×2): qty 0.8

## 2015-07-21 NOTE — Evaluation (Signed)
Physical Therapy Evaluation Patient Details Name: [name] MRN: [id number] DOB: Nov 01, 1960 Today's Date: 07/21/2015   History of Present Illness  55 y.o. female with h/o DM, DVT, MS and is WC bound at baseline admitted with AMS, change from normal status per SNF report, also with  Ongoing multiple sacral and buttock ulcers.  Patient was DC'd to [facility] and Rehab/ Discharged from [facility] 07/09/15.  Clinical Impression  Patient presents  With total care status. PT EPIC notes from previous admit on 1/24 indicate that patient had no movement in [place]. Therefore, question reliability of ED note from SNF that patient was ambulatory. Last admit, per EPIC PT note,  patient was able to provide information and stated that she was WC bound and was assisted  By 1 person to [facility]. Patient has on goin bil. Ischial wounds, therefore would obtain air mattress(noted [facility] consult). Do not recommend  sitting in recliner at this time. Patient also appears with R facial droop and R hand fisted.(see OT assessment of UE- which appears different than prior admit according to [facility] PT notes). Patient is garbling speech with ocassional words- perseverated "[place], [place]." Pt admitted with above diagnosis. Pt currently with functional limitations due to the deficits listed below (see PT Problem List).  Pt will benefit from TRIAL skilled PT to increase  safety with mobility to allow discharge to the venue listed below.        Follow Up Recommendations  SNF    Equipment Recommendations    air mattress   Recommendations for Other Services       Precautions / Restrictions Precautions Precautions: Fall Precaution Comments: ischial decubitus, LE paralysis      Mobility  Bed Mobility Overal bed mobility: Needs Assistance Bed Mobility: Supine to Sit;Sit to Supine     Supine to sit: Total assist;+2 for physical assistance;+2 for safety/equipment;HOB elevated Sit to supine: Total assist;+2 for physical  assistance;+2 for safety/equipment   General bed mobility comments: no assistance elicited by the patient.  Transfers                 General transfer comment: NT  Ambulation/Gait                Stairs            Wheelchair Mobility    Modified Rankin (Stroke Patients Only)       Balance Overall balance assessment: Needs assistance Sitting-balance support: Feet supported Sitting balance-Leahy Scale: Zero                                       Pertinent Vitals/Pain Pain Assessment: Faces Faces Pain Scale: Hurts whole lot Pain Location: not stated but appears on buttocks when assisted to sitting. Pain Descriptors / Indicators: Grimacing;Guarding;Crying Pain Intervention(s): Limited activity within patient's tolerance;Monitored during session    Home Living Family/patient unable to report but from [place] facility                      Prior Function   WC bound              Hand Dominance        Extremity/Trunk Assessment      see OT notes         Lower Extremity Assessment: LLE deficits/detail;RLE deficits/detail RLE Deficits / Details: no functional movement BLEs, sensation intact to light touch  LLE Deficits / Details: no functional movement BLEs  Cervical / Trunk Assessment:  (lateral bend to left, elongated trunk on R.)  Communication      Cognition Arousal/Alertness: Awake/alert   Overall Cognitive Status: Impaired/Different from baseline Area of Impairment: Orientation;Following commands;Awareness       Following Commands: Follows one step commands inconsistently       General Comments: per PT note on 1/24 , MS was  [facility] and patient able to provide information. Did stae ", [place]"  repeatedly, perseverating.    General Comments      Exercises        Assessment/Plan    PT Assessment Patient needs continued PT services (limited trial as patient in s total care and from  SNF.)  PT Diagnosis Generalized weakness;Altered mental status   PT Problem List Decreased strength;Decreased range of motion;Decreased activity tolerance;Decreased balance;Decreased mobility;Decreased cognition;Impaired sensation;Decreased knowledge of precautions;Decreased safety awareness;Decreased knowledge of use of DME;Decreased skin integrity;Pain  PT Treatment Interventions     PT Goals (Current goals can be found in the Care Plan section) Acute Rehab PT Goals Patient Stated Goal: none PT Goal Formulation: Patient unable to participate in goal setting Time For Goal Achievement: 08/04/15 Potential to Achieve Goals: Poor    Frequency Min 2X/week (trial, may consider  discharge after next visit due to total care and decubitus which would  prevent sitting in chair at this time.)   Barriers to discharge        Co-evaluation PT/OT/SLP Co-Evaluation/Treatment: Yes Reason for Co-Treatment: For patient/therapist safety;Complexity of the patient's impairments (multi-system involvement) PT goals addressed during session: Mobility/safety with mobility OT goals addressed during session: ADL's and self-care       End of Session   Activity Tolerance: Patient limited by pain Patient left: in bed;with call bell/phone within reach;with bed alarm set Nurse Communication: Mobility status;Need for lift equipment         Time: [id number] PT Time Calculation (min) (ACUTE ONLY): 23 min   Charges:   PT Evaluation $PT Eval Moderate Complexity: 1 Procedure     PT G CodesClaretha Cooper 07/21/2015, 8:58 AM Tresa Endo PT [phone]

## 2015-07-21 NOTE — Progress Notes (Signed)
Occupational Therapy Evaluation Patient Details Name: [name] MRN: [id number] DOB: 05-25-1961 Today's Date: 07/21/2015    History of Present Illness 55 y.o. female with h/o DM, DVT, MS and is WC bound at baseline admitted with multiple sacral and buttock ulcers AMS from [facility] and [place]. Discharged from [facility] 07/09/15   Clinical Impression   Patient presents to OT with decreased ADL independence and safety due to the deficits listed below. She will benefit from skilled OT to increase BUE function and ability to participate in simple feeding/ADLs. OT will follow.    Follow Up Recommendations  SNF    Equipment Recommendations  None recommended by OT    Recommendations for Other Services Speech consult     Precautions / Restrictions Precautions Precautions: Fall Precaution Comments: ischial decubitus, LE paralysis      Mobility Bed Mobility Overal bed mobility: Needs Assistance Bed Mobility: Supine to Sit;Sit to Supine     Supine to sit: Total assist;+2 for physical assistance;+2 for safety/equipment;HOB elevated Sit to supine: Total assist;+2 for physical assistance;+2 for safety/equipment   General bed mobility comments: no assistance elicited by the patient.  Transfers                 General transfer comment: NT    Balance Overall balance assessment: Needs assistance Sitting-balance support: Feet supported Sitting balance-Leahy Scale: Zero Sitting balance - Comments: sat on EOB x 10 min; total A, no protective reactions noted                                    ADL Overall ADL's : Needs assistance/impaired Eating/Feeding: Total assistance;Bed level;Sitting   Grooming: Total assistance   Upper Body Bathing: Total assistance   Lower Body Bathing: Total assistance   Upper Body Dressing : Total assistance   Lower Body Dressing: Total assistance               Functional mobility during ADLs: Total assistance;+2 for  physical assistance;+2 for safety/equipment (supine<>sit) General ADL Comments: Patient with eyes open. Perseverative speech, such as "Yes m'am" or "No" over and over again. Was not able to provide accurate answers to questions or information about PLOF/home environment. Total A +2 supine to sit EOB. Patient unable to use either arm functionally to hold a cup or utensil or feed herself. OT was able to feed patient a few bites of grits and about 2/3 of her milk (pt used straw while therapist held milk carton), but inconsistent with swallowing. SLP notified and will address. Returned patient to supine total A +2 and positioned comfortably.      Vision     Perception     Praxis      Pertinent Vitals/Pain Pain Assessment: Faces Faces Pain Scale: Hurts whole lot Pain Location: not stated but appears to be buttocks when assisted to sitting EOB Pain Descriptors / Indicators: Grimacing;Guarding;Crying Pain Intervention(s): Limited activity within patient's tolerance;Monitored during session;Repositioned     Hand Dominance Right (taken from prior admission January 2017)   Extremity/Trunk Assessment Upper Extremity Assessment Upper Extremity Assessment: RUE deficits/detail;LUE deficits/detail RUE Deficits / Details: no AROM. PROM to 80 degrees shoulder flexion, PROM elbow/wrist/hand [facility] but tight RUE Coordination: decreased fine motor;decreased gross motor LUE Deficits / Details: Minimal AROM shoulder flexion 50 degrees. PROM WFL shoulder flexion up to 120 degrees, WFL elbow/wrist/hand. Tends to keep arm supinated LUE Coordination: decreased fine motor;decreased gross motor  Lower Extremity Assessment Lower Extremity Assessment: Defer to PT evaluation RLE Deficits / Details: no functional movement BLEs, sensation intact to light touch LLE Deficits / Details: no functional movement BLEs   Cervical / Trunk Assessment Cervical / Trunk Assessment:  (lateral bend to left, elongated trunk on  R.)   Communication Communication Communication: Other (comment) (perseverative speech (2-word phrases), such as "Yes m'am," "No," etc.   Cognition Arousal/Alertness: Awake/alert Behavior During Therapy: Anxious Overall Cognitive Status: Impaired/Different from baseline Area of Impairment: Orientation;Following commands;Awareness Orientation Level: Place;Time;Situation     Following Commands: Follows one step commands inconsistently       General Comments: per PT note on 1/24 , MS was  [facility] and patient able to provide information. Did stae "[place], [place]"  repeatedly, perseverating.   General Comments       Exercises       Shoulder Instructions      Home Living Family/patient expects to be discharged to:: Skilled nursing facility Living Arrangements: Other (Comment) (staff at nursing home)                                      Prior Functioning/Environment Level of Independence: Needs assistance  Gait / Transfers Assistance Needed: assistance with transfers, w/c bound ADL's / Homemaking Assistance Needed: from previous admission, pt requires assistance for bathing/dressing   Comments: pt unable to report any information; gathered from previous recent admission in January 2017    OT Diagnosis: Generalized weakness;Cognitive deficits;Altered mental status   OT Problem List: Decreased strength;Decreased range of motion;Decreased activity tolerance;Impaired balance (sitting and/or standing);Decreased coordination;Decreased cognition;Impaired tone;Impaired UE functional use;Pain   OT Treatment/Interventions: Self-care/ADL training;Therapeutic exercise;Therapeutic activities;Patient/family education    OT Goals(Current goals can be found in the care plan section) Acute Rehab OT Goals Patient Stated Goal: none OT Goal Formulation: Patient unable to participate in goal setting Time For Goal Achievement: 08/04/15 Potential to Achieve Goals: Fair ADL Goals Pt  Will Perform Eating: with max assist;bed level Additional ADL Goal #1: Patient will improve to AAROM RUE and AROM LUE to assist with ADLs/functional mobility.  OT Frequency: Min 1X/week   Barriers to D/C:            Co-evaluation PT/OT/SLP Co-Evaluation/Treatment: Yes Reason for Co-Treatment: Complexity of the patient's impairments (multi-system involvement);For patient/therapist safety PT goals addressed during session: Mobility/safety with mobility OT goals addressed during session: ADL's and self-care      End of Session Nurse Communication: Mobility status;Other (comment) (wait for SLP eval prior to feeding breakfast)  Activity Tolerance: Patient tolerated treatment well Patient left: in bed;with call bell/phone within reach;with bed alarm set   Time: [id number] OT Time Calculation (min): 23 min Charges:  OT General Charges $OT Visit: 1 Procedure OT Evaluation $OT Eval Moderate Complexity: 1 Procedure G-Codes:    Katiana Ruland A 08/02/15, 9:16 AM

## 2015-07-21 NOTE — Consult Note (Signed)
WOC wound consult note Reason for Consult: Pressure injuries to the right heel, bilateral ischial tuberosities. (Right heel stage 2 is new since her last admission and my last visit, two weeks ago)  Macerated sacral area without breakdown. Wound type:pressure Pressure Ulcer POA: Yes Measurement:right heel 2.5cm x 2cm x 0.2cm with peeling tissue at periphery indicative of resolving blister.  Red, moist, no exudate. Right ischial tuberosity (Stage 3) 3cm x 2.cm x 0.4cm red wound base with serous exudate.  Left IT (Stage 3):  2cm x 2cm x 0.5cm with moist red wound base, minimal serous exudate. Sacrum is intact with macerated tissues. Wound bed:As described above. Drainage (amount, consistency, odor) As described above Periwound:Intact, dry Dressing procedure/placement/frequency:I will not order an air mattress today as patient is improving and can be turned from side to side.  We will continue to focus on striking a balance between elevation of head of bed for swallowing purposes and keeping it below 30 degrees to heal the IT ulcerations.  There are no heel boots here with her today and a resolving partial thickness Stage 2 pressure injury on the right heel, so I have ordered bilateral heel boots.  (Patient with lymphedema). [place] nursing team will not follow, but will remain available to this patient, the nursing and medical teams.  Please re-consult if needed. Thanks, Maudie Flakes, MSN, RN, [place], [facility]  Pager# [phone]

## 2015-07-21 NOTE — Progress Notes (Signed)
Initial Nutrition Assessment  DOCUMENTATION CODES:   Non-severe (moderate) malnutrition in context of chronic illness  INTERVENTION:  - Will decrease Prostat to once/day, this supplement provides 100 kcal and 15 grams of protein - Will order Ensure Enlive po BID, each supplement provides 350 kcal and 20 grams of protein - Tech/RN to assist with ordering and feeding for meals as needed - RD will continue to monitor for needs  NUTRITION DIAGNOSIS:   Increased nutrient needs related to wound healing as evidenced by estimated needs.   GOAL:   Patient will meet greater than or equal to 90% of their needs  MONITOR:   PO intake, Supplement acceptance, Weight trends, Labs, Skin, I & O's  REASON FOR ASSESSMENT:   Consult Assessment of nutrition requirement/status  ASSESSMENT:   55 year-old female presented with worsening confusion patient was [facility]. Per notes last time she was seen at her baseline which is alert and oriented 4 and Megan Salon to walk or three days ago irregular staff apparently weekend staff was not aware of patient's change. She is no longer answering any questions in meaningful fashion patient at her baseline has profound weakness of the lower extremity as well as some weakness of upper extremities but usually able to feed herself and left hand this is secondary to history of multiple sclerosis. 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 Patient herself unable to provide any history  Pt seen for consult. No intakes documented since admission. Pt with hx of MS and notes indicate she was a/o x4 PTA but found to have AMS shortly before admission; this is ongoing and pt was unable to provide relevant information and no visitors present at this time.   Pt denies abdominal pain or nausea at this time. She indicates that she  did consume lunch but is unable to give further details. Pt with multiple wounds. Prostat ordered BID; will adjust supplement orders as outlined above. Pt seen by SLP today and current diet (Dysphagia 1 with thin liquids) is per recommendation.   Physical assessment indicates moderate muscle and moderate fat wasting. Per chart review, no weight from this admission but weight 07/07/15 consistent with weight from 01/27/15; will continue to monitor weight trends.   Unsure if pt is meeting needs currently. Medications reviewed. Labs reviewed; CBGs: 94-143 mg/dL, Cl: 113 mmol/L, BUN/creatinine elevated but trending down, Phos: 5 mg/dL, GFR: 17.   Diet Order:  DIET - DYS 1 Room service appropriate?: No; Fluid consistency:: Thin  Skin:   Stage 2 and unstageable pressure areas to ischial tuberosity, Unstageable pressure area to sacrum, DTI to R heel  Last BM:  PTA  Height:   Ht Readings from Last 1 Encounters:  07/05/15 5\' 6"  (1.676 m)    Weight:   Wt Readings from Last 1 Encounters:  07/07/15 149 lb 11.1 oz (67.9 kg)    Ideal Body Weight:  59.09 kg (kg)  BMI:  There is no weight on file to calculate BMI.  Estimated Nutritional Needs:   Kcal:  1830-2030 (27-30 kcal/kg)  Protein:  82-95 grams (1.2-1.4 grams/kg)  Fluid:  2 L/day  EDUCATION NEEDS:   No education needs identified at this time     [name], RD, LDN Inpatient Clinical Dietitian Pager # [phone] After hours/weekend pager # [phone]

## 2015-07-21 NOTE — Evaluation (Signed)
Clinical/Bedside Swallow Evaluation Patient Details  Name: [name] MRN: [id number] Date of Birth: 04-17-1961  Today's Date: 07/21/2015 Time: SLP Start Time (ACUTE ONLY): [id number] SLP Stop Time (ACUTE ONLY): 0939 SLP Time Calculation (min) (ACUTE ONLY): 25 min  Past Medical History:  Past Medical History  Diagnosis Date  . MS (multiple sclerosis) ([place])   . Hyperglycemia   . Edema   . High blood pressure   . Diabetes mellitus without complication ([place])   . Pyelonephritis 12/16/2014   Past Surgical History:  Past Surgical History  Procedure Laterality Date  . Hernia mesh removal    . Tubes tided    . Tubal ligation    . Esophagogastroduodenoscopy Left 01/02/2015    Procedure: ESOPHAGOGASTRODUODENOSCOPY (EGD);  Surgeon: Arta Silence, MD;  Location: [facility];  Service: Endoscopy;  Laterality: Left;   HPI:  55 y.o. female with h/o DM, DVT, MS and is WC bound at baseline admitted with multiple sacral and buttock ulcers AMS from [facility]. Discharged from [facility] 07/09/15   Assessment / Plan / Recommendation Clinical Impression  Pt presents with moderate suspected primary oral based dysphagia - delayed oral transiting with decreased mastication skills - vertical mastication pattern noted.  Oral holding worse with solids requriing mastication than liquids.  Suspect pharyngeal swallow strong although possibly mildly delayed.  Use of applesauce vs liquids faciliated clearance of solids with decreased asp risk. Overt aspiration suspected with thin coffee via straw likely due to premature spillage into airway.  Pt states this does not occur often but given her hospital admit, change in medical status, SLP recommends to modify diet for increased efficiency and safety.    Pt educated to findings/recommendations and signs posted in room.  Will follow up for dietary advancement readiness and pt education.      Aspiration Risk  Moderate aspiration risk (chronic due to  progressive neurological dx)    Diet Recommendation Dysphagia 1 (Puree);Thin liquid   Liquid Administration via: No straw;Spoon Medication Administration: Whole meds with puree Supervision: Full supervision/cueing for compensatory strategies;Staff to assist with self feeding Compensations: Slow rate;Small sips/bites (assure oral cavity clear) Postural Changes: Seated upright at 90 degrees;Remain upright for at least 30 minutes after po intake    Other  Recommendations Oral Care Recommendations: Oral care BID Other Recommendations: Have oral suction available   Follow up Recommendations  None    Frequency and Duration min 1 x/week  1 week       Prognosis Prognosis for Safe Diet Advancement: Guarded Barriers to Reach Goals: Time post onset      Swallow Study   General Date of Onset: 07/21/15 HPI: 55 y.o. female with h/o DM, DVT, MS and is WC bound at baseline admitted with multiple sacral and buttock ulcers AMS from [place] [facility]. Discharged from [facility] 07/09/15 Type of Study: Bedside Swallow Evaluation Diet Prior to this Study: Dysphagia 3 (soft);Thin liquids Temperature Spikes Noted: No Respiratory Status: Room air History of Recent Intubation: No Behavior/Cognition: Alert Oral Cavity Assessment: Dry Oral Care Completed by SLP: No Oral Cavity - Dentition: Adequate natural dentition (poor dentition) Vision: Impaired for self-feeding Self-Feeding Abilities: Total assist Patient Positioning: Upright in bed Baseline Vocal Quality: Low vocal intensity Volitional Cough: Cognitively unable to elicit Volitional Swallow: Unable to elicit    Oral/Motor/Sensory Function Overall Oral Motor/Sensory Function: Generalized oral weakness   Ice Chips Ice chips: Not tested   Thin Liquid Thin Liquid: Impaired Presentation: Self Fed;Cup;Spoon;Straw Oral Phase Impairments: Reduced lingual  movement/coordination;Reduced labial seal Oral Phase Functional Implications: Prolonged  oral transit Pharyngeal  Phase Impairments: Cough - Immediate Other Comments: immediate post=swallow cough with coffee via straw, presumed due to premature spillage of liquid into airway    Nectar Thick Nectar Thick Liquid: Not tested   Honey Thick Honey Thick Liquid: Not tested   Puree Puree: Impaired Presentation: Self Fed Oral Phase Impairments: Reduced lingual movement/coordination;Reduced labial seal Oral Phase Functional Implications: Prolonged oral transit Pharyngeal Phase Impairments: Suspected delayed Swallow   Solid   GO   Solid: Impaired Oral Phase Impairments: Reduced lingual movement/coordination;Poor awareness of bolus;Impaired mastication;Reduced labial seal Oral Phase Functional Implications: Prolonged oral transit Other Comments: use of applesauce to aid oral transiting helpful        Luanna Salk, [place] [facility], [place] [phone]

## 2015-07-21 NOTE — NC FL2 (Signed)
[place] LEVEL OF CARE SCREENING TOOL     IDENTIFICATION  Patient Name: [name] Birthdate: 1960/07/06 Sex: female Admission Date (Current Location): 07/20/2015  [facility] and [place] Number:  Herbalist and Address:  [facility],  [place] [address] [place]      Provider Number: [id number]  Attending Physician Name and Address:  Robbie Lis, MD  Relative Name and Phone Number:       Current Level of Care: Hospital Recommended Level of Care: [place] Prior Approval Number:    Date Approved/Denied:   PASRR Number: [id number]  Discharge Plan: SNF    Current Diagnoses: Patient Active Problem List   Diagnosis Date Noted  . Acute renal failure superimposed on stage 3 chronic kidney disease ([place]) 07/21/2015  . Leukocytosis 07/21/2015  . Anemia of chronic disease 07/21/2015  . Hyperkalemia 07/21/2015  . UTI (urinary tract infection) 07/21/2015  . Stage 4 decubitus ulcer ([place]) 07/05/2015  . Diabetes mellitus type 2, controlled ([place]) 07/05/2015  . Acute encephalopathy 12/28/2014  . DVT, lower extremity ([place]) 12/28/2014  . Multiple sclerosis diagnosed 2002-not on Therapy any longer 06/26/2012    Orientation RESPIRATION BLADDER Height & Weight      (Disoriented x4.)  O2 (2 L/min) Indwelling catheter Weight:   Height:     BEHAVIORAL SYMPTOMS/MOOD NEUROLOGICAL BOWEL NUTRITION STATUS      Continent Diet (DYS 1; Fluid consistency: Thin )  AMBULATORY STATUS COMMUNICATION OF NEEDS Skin   Extensive Assist Verbally PU Stage and Appropriate Care (Pressure injuries to the right heel, bilateral ischial tuberosities. Macerated sacral area without breakdown. A resolving partial thickness Stage 2 pressure injury on the right heel, ordered bilateral heel boots.)                       Personal Care Assistance Level of Assistance    Bathing Assistance: Maximum assistance Feeding assistance: Limited  assistance Dressing Assistance: Maximum assistance     Functional Limitations Info  Sight, Hearing, Speech Sight Info: Adequate Hearing Info: Adequate Speech Info: Adequate    SPECIAL CARE FACTORS FREQUENCY  PT (By licensed PT), OT (By licensed OT)     PT Frequency: 5x week OT Frequency: 5x week            Contractures Contractures Info: Not present    Additional Factors Info  Code Status, Allergies, Insulin Sliding Scale Code Status Info: FULL Allergies Info: No Known Allergies   Insulin Sliding Scale Info: Novolog 6x a day       Current Medications (07/21/2015):  This is the current hospital active medication list Current Facility-Administered Medications  Medication Dose Route Frequency Provider Last Rate Last Dose  . acetaminophen (TYLENOL) tablet 650 mg  650 mg Oral Q6H PRN Toy Baker, MD       Or  . acetaminophen (TYLENOL) suppository 650 mg  650 mg Rectal Q6H PRN Toy Baker, MD      . albuterol (PROVENTIL) (2.5 MG/3ML) 0.083% nebulizer solution 2.5 mg  2.5 mg Inhalation Q4H PRN Toy Baker, MD      . antiseptic oral rinse (CPC / CETYLPYRIDINIUM CHLORIDE 0.05%) solution 7 mL  7 mL Mouth Rinse q12n4p Toy Baker, MD   7 mL at 07/21/15 1229  . baclofen (LIORESAL) tablet 10 mg  10 mg Oral TID Toy Baker, MD   10 mg at 07/21/15 1229  . bisacodyl (DULCOLAX) suppository 10 mg  10 mg Rectal Daily PRN  Toy Baker, MD      . cefTRIAXone (ROCEPHIN) 1 g in dextrose 5 % 50 mL IVPB  1 g Intravenous Q24H Robbie Lis, MD   1 g at 07/21/15 1229  . chlorhexidine (PERIDEX) 0.12 % solution 15 mL  15 mL Mouth Rinse BID Toy Baker, MD   15 mL at 07/21/15 1228  . darifenacin (ENABLEX) 24 hr tablet 7.5 mg  7.5 mg Oral Daily Toy Baker, MD   7.5 mg at 07/21/15 1228  . [START ON 07/22/2015] enoxaparin (LOVENOX) injection 70 mg  70 mg Subcutaneous Q24H Robbie Lis, MD      . famotidine (PEPCID) tablet 20 mg  20 mg Oral Daily Robbie Lis, MD      . feeding supplement (ENSURE ENLIVE) (ENSURE ENLIVE) liquid 237 mL  237 mL Oral BID BM Robbie Lis, MD      . Derrill Memo ON 07/22/2015] feeding supplement (PRO-STAT SUGAR FREE 64) liquid 30 mL  30 mL Oral Daily Robbie Lis, MD      . gabapentin (NEURONTIN) capsule 100 mg  100 mg Oral QHS Toy Baker, MD   100 mg at 07/20/15 2303  . hydrocerin (EUCERIN) cream   Topical BID Robbie Lis, MD      . HYDROcodone-acetaminophen (NORCO/VICODIN) 5-325 MG per tablet 1-2 tablet  1-2 tablet Oral Q4H PRN Toy Baker, MD      . insulin aspart (novoLOG) injection 0-9 Units  0-9 Units Subcutaneous 6 times per day Toy Baker, MD   1 Units at 07/21/15 1228  . Omega 3 CAPS 1,000 mg  1,000 mg Oral Daily Robbie Lis, MD   1,000 mg at 07/21/15 1229  . ondansetron (ZOFRAN) tablet 4 mg  4 mg Oral Q6H PRN Toy Baker, MD       Or  . ondansetron (ZOFRAN) injection 4 mg  4 mg Intravenous Q6H PRN Toy Baker, MD      . pravastatin (PRAVACHOL) tablet 10 mg  10 mg Oral q1800 Toy Baker, MD      . sodium chloride flush (NS) 0.9 % injection 3 mL  3 mL Intravenous Q12H Toy Baker, MD   3 mL at 07/20/15 2303     Discharge Medications: Please see discharge summary for a list of discharge medications.  Relevant Imaging Results:  Relevant Lab Results:   Additional Information SSN: [id number]  Harlon Flor, Student-SW [phone]

## 2015-07-21 NOTE — Care Management Note (Signed)
Case Management Note  Patient Details  Name: [name] MRN: [id number] Date of Birth: [birth year]  Subjective/Objective:        Early spesis            Action/Plan:Date: July 21, 2015 Chart reviewed for concurrent status and case management needs. Will continue to follow patient for changes and needs: Velva Harman, BSN, RN, [place]   [phone]   Expected Discharge Date:   (unknown)               Expected Discharge Plan:  Skilled Nursing Facility  In-House Referral:  Clinical Social Work  Discharge planning Services  CM Consult  Post Acute Care Choice:  NA Choice offered to:  NA  DME Arranged:    DME Agency:     HH Arranged:    [place] Agency:     Status of Service:  Completed, signed off  Medicare Important Message Given:    Date Medicare IM Given:    Medicare IM give by:    Date Additional Medicare IM Given:    Additional Medicare Important Message give by:     If discussed at [place] of Stay Meetings, dates discussed:    Additional Comments:  Leeroy Cha, RN 07/21/2015, 10:19 AM

## 2015-07-21 NOTE — Progress Notes (Addendum)
Patient ID: NYSA BROSZ, female   DOB: 1961/03/02, 55 y.o.   MRN: [id number] [facility] PROGRESS NOTE  JORDI GUTERMUTH [id number] DOB: Oct 28, 1960 DOA: 07/20/2015 PCP: [name], MD  Brief narrative:    66 -year-old female with past medical history of multiple sclerosis, dyslipidemia, diabetes mellitus on emtformin, decubitus ulcers, history of DVT on AC with xarelto. [facility] to [facility] with worsening confusion over past 24 hours prior to this admission. Due to altered mental status patient is not able to provide details of present illness.   Patient was hemodynamically stable on the admission. She was afebrile. Her oxygen saturation was 87% on room air but this has quickly improved with slight nasal cannula oxygen support up to 100%. Her blood work showed WBC count 15.9, hemoglobin 9.7, potassium 5.5, normal troponin level, creatinine 3.82 (baseline 1.14 in 12/2014). UA showed small leukocytes and few WBC.   Assessment/Plan:    Principal Problem:   Acute encephalopathy / UTI / Leukocytosis  - Unclear etiology, possibly UTI - Her UA showed small leukocytes but she was not started on abx on admission - Will treat with empiric rocephin and hopefully mental status improves  - Follow up urine culture results  - Order placed for MRI brain for further evaluation - CT head showed no acute intracranial findings   Active Problems:   Multiple sclerosis diagnosed 2002-not on Therapy any longer - Stable - Will return to SNF once stable     DVT, lower extremity (HCC) - Continue Lovenox sub Q - At home take xarelto - On Lovenox due to renal failure     Stage 4 decubitus ulcer ([place]) - Appreciate wound care assessment - Pressure injuries to the right heel, bilateral ischial tuberosities. Right heel stage 2 is new since her last admission. Macerated sacral area is without breakdown. - Measurement:right heel 2.5cm x 2cm x  0.2cm with peeling tissue at periphery indicative of resolving blister. Right ischial tuberosity (Stage 3) 3cm x 2.cm x 0.4cm red wound base with serous exudate. Left IT (Stage 3): 2cm x 2cm x 0.5cm with moist red wound base, minimal serous exudate. Sacrum is intact with macerated tissues. - Dressing procedure/placement/frequency: Continue to focus on striking a balance between elevation of head of bed for swallowing purposes and keeping it below 30 degrees to heal the IT ulcerations. Stage 2 pressure injury on the right heel - ordered bilateral heel boots.    Diabetes mellitus type 2, controlled associated with CKD stage 3 without long term insulin use (HCC) - A1c is pending - On SSI - Metformin on hold     Acute renal failure superimposed on stage 3 chronic kidney disease (HCC) - Baseline Cr 1.14 since 12/2014 - Cr high on admission at 3.82, likely due to lasix, metformin, both meds on hold - Cr slowly improving     Anemia of chronic disease - Due to anemia of chronic kidney disease - Monitor CBC daily    Hyperkalemia - Spontaneously resolved    Moderate protein calorie malnutrition - In the context of chronic illness - Nutrition consulted   DVT Prophylaxis  - On Lovenox now due to renal failure; at home on xarelto   Code Status: Full.  Family Communication:  Family not at the bedside Disposition Plan: To skilled for nursing facility likely by 07/22/2015 if she remains stable  IV access:  Peripheral IV  Procedures and diagnostic studies:    Dg Chest 1 View 07/20/2015  Slightly hypoinspiratory exam.  No active disease.   Ct Head Wo Contrast 07/20/2015 1.  No acute intracranial abnormality. 2. Mild motion degradation. 3.  Cerebral atrophy and small vessel ischemic change.   Medical Consultants:  None   Other Consultants:  PT WOC  IAnti-Infectives:   Rocephin 07/21/2015 -->    Leisa Lenz, MD  [facility] Pager [phone]  Time spent in minutes: 25  minutes  If 7PM-7AM, please contact night-coverage [email or web address] Password [email or web address] 07/21/2015, 11:04 AM   LOS: 1 day    HPI/Subjective: No acute overnight events. No respiratory distress.   Objective: Filed Vitals:   07/20/15 2130 07/20/15 2135 07/21/15 0149 07/21/15 0443  BP: 114/57  98/52 105/61  Pulse: 51  85 73  Temp: 98.1 F (36.7 C)  98.1 F (36.7 C) 98 F (36.7 C)  TempSrc: Oral  Oral Oral  Resp: 15  15 16   SpO2: 87% 100% 100% 100%    Intake/Output Summary (Last 24 hours) at 07/21/15 1104 Last data filed at 07/21/15 0945  Gross per 24 hour  Intake      0 ml  Output   2950 ml  Net  -2950 ml    Exam:   General:  Pt is not in acute distress  Cardiovascular: Regular rate and rhythm, S1/S2 (+)  Respiratory: No wheezing, no crackles, no rhonchi  Abdomen: Soft, non tender, non distended, bowel sounds present  Extremities: No cyanosis, pulses palpable bilaterally  Neuro: Grossly nonfocal  Data Reviewed: Basic Metabolic Panel:  Recent Labs Lab 07/20/15 1712 07/20/15 2320 07/21/15 0310  NA 141 142 143  K 5.5* 4.7 4.5  CL 106 112* 113*  CO2 23 21* 21*  GLUCOSE 114* 122* 104*  BUN 80* 79* 72*  CREATININE 3.82* 3.66* 3.24*  CALCIUM 10.1 9.2 9.2  MG  --   --  2.3  PHOS  --   --  5.0*   Liver Function Tests:  Recent Labs Lab 07/20/15 1712 07/21/15 0310  AST 13* 11*  ALT 14 10*  ALKPHOS 47 40  BILITOT 0.5 0.3  PROT 8.1 6.6  ALBUMIN 3.0* 2.5*   No results for input(s): LIPASE, AMYLASE in the last 168 hours.  Recent Labs Lab 07/20/15 1712  AMMONIA 21   CBC:  Recent Labs Lab 07/20/15 1712 07/21/15 0310  WBC 15.9* 11.5*  NEUTROABS 11.8*  --   HGB 9.7* 8.1*  HCT 29.8* 25.7*  MCV 85.6 88.3  PLT 666* 646*   Cardiac Enzymes:  Recent Labs Lab 07/20/15 1712 07/20/15 2320 07/21/15 0310 07/21/15 0900  TROPONINI <0.03 <0.03 <0.03 <0.03   BNP: Invalid input(s): POCBNP CBG:  Recent Labs Lab 07/20/15 1830 07/20/15 2159  07/21/15 0023 07/21/15 0320 07/21/15 0758  GLUCAP 102* 105* 143* 97 94    MRSA PCR Screening     Status: None   Collection Time: 07/20/15  9:35 PM  Result Value Ref Range Status   MRSA by PCR NEGATIVE NEGATIVE Final     Scheduled Meds: . baclofen  10 mg Oral TID  . darifenacin  7.5 mg Oral Daily  . enoxaparin (LOVENOX) injection  1 mg/kg Subcutaneous Q24H  . famotidine  20 mg Oral Daily  . feeding supplement   30 mL Oral BID  . gabapentin  100 mg Oral QHS  . hydrocerin   Topical BID  . insulin aspart  0-9 Units Subcutaneous 6 times per day  . pravastatin  10 mg Oral q1800

## 2015-07-21 NOTE — Progress Notes (Signed)
CSW confirmed with patient's cousin, [name] (cell#: [phone]) that they plan for patient to return to [facility] when ready for discharge. CSW confirmed with Katharine Look at [facility] that they would be able to take patient back when ready. CSW also brought up concerns from the ED re: patient's physical appearance when she came into the ED (i.e. Matted hair, multiple skin wounds on patient's backside, oral care & dried food around her mouth, etc..). Katharine Look at [facility] states that she will certainly be addressing those concerns.   Anticipating possible discharge back to SNF tomorrow - CSW will follow-up.    Raynaldo Opitz, [facility] Clinical Social Worker cell #: [phone]

## 2015-07-22 DIAGNOSIS — G9340 Encephalopathy, unspecified: Secondary | ICD-10-CM

## 2015-07-22 DIAGNOSIS — N183 Chronic kidney disease, stage 3 (moderate): Secondary | ICD-10-CM

## 2015-07-22 DIAGNOSIS — E1122 Type 2 diabetes mellitus with diabetic chronic kidney disease: Secondary | ICD-10-CM

## 2015-07-22 DIAGNOSIS — G35 Multiple sclerosis: Secondary | ICD-10-CM

## 2015-07-22 DIAGNOSIS — E440 Moderate protein-calorie malnutrition: Secondary | ICD-10-CM

## 2015-07-22 LAB — GLUCOSE, CAPILLARY
GLUCOSE-CAPILLARY: 128 mg/dL — AB (ref 65–99)
GLUCOSE-CAPILLARY: 222 mg/dL — AB (ref 65–99)
GLUCOSE-CAPILLARY: 259 mg/dL — AB (ref 65–99)
GLUCOSE-CAPILLARY: 91 mg/dL (ref 65–99)
GLUCOSE-CAPILLARY: 96 mg/dL (ref 65–99)
Glucose-Capillary: 225 mg/dL — ABNORMAL HIGH (ref 65–99)
Glucose-Capillary: 176 mg/dL — ABNORMAL HIGH (ref 65–99)

## 2015-07-22 LAB — BASIC METABOLIC PANEL
Anion gap: 7 (ref 5–15)
BUN: 41 mg/dL — ABNORMAL HIGH (ref 6–20)
CALCIUM: 9.7 mg/dL (ref 8.9–10.3)
CO2: 24 mmol/L (ref 22–32)
Chloride: 115 mmol/L — ABNORMAL HIGH (ref 101–111)
Creatinine, Ser: 1.27 mg/dL — ABNORMAL HIGH (ref 0.44–1.00)
GFR, EST AFRICAN AMERICAN: 51 mL/min — AB (ref >60)
GFR, EST NON AFRICAN AMERICAN: 44 mL/min — AB (ref >60)
GLUCOSE: 246 mg/dL — AB (ref 65–99)
POTASSIUM: 4.1 mmol/L (ref 3.5–5.1)
SODIUM: 146 mmol/L — AB (ref 135–145)

## 2015-07-22 LAB — CBC
HCT: 26.9 % — ABNORMAL LOW (ref 36.0–46.0)
Hemoglobin: 8.5 g/dL — ABNORMAL LOW (ref 12.0–15.0)
MCH: 28 pg (ref 26.0–34.0)
MCHC: 31.6 g/dL (ref 30.0–36.0)
MCV: 88.5 fL (ref 78.0–100.0)
PLATELETS: 651 10*3/uL — AB (ref 150–400)
RBC: 3.04 MIL/uL — AB (ref 3.87–5.11)
RDW: 17.5 % — ABNORMAL HIGH (ref 11.5–15.5)
WBC: 12.1 10*3/uL — ABNORMAL HIGH (ref 4.0–10.5)

## 2015-07-22 LAB — HEMOGLOBIN A1C
Hgb A1c MFr Bld: 5.8 % — ABNORMAL HIGH (ref 4.8–5.6)
MEAN PLASMA GLUCOSE: 120 mg/dL

## 2015-07-22 MED ORDER — SODIUM CHLORIDE 0.45 % IV SOLN
INTRAVENOUS | Status: DC
  Administered 2015-07-22: 14:00:00 via INTRAVENOUS

## 2015-07-22 MED ORDER — SODIUM CHLORIDE 0.9 % IV SOLN: INTRAVENOUS | Status: DC

## 2015-07-22 NOTE — Progress Notes (Signed)
Patient ID: [name], female   DOB: 08/10/1960, 55 y.o.   MRN: [id number] [facility] PROGRESS NOTE  JERMAINE HAFEN [id number] DOB: August 23, 1960 DOA: 07/20/2015 PCP: [name], MD  Brief narrative:    46 -year-old female with past medical history of multiple sclerosis, dyslipidemia, diabetes mellitus on emtformin, decubitus ulcers, history of DVT on AC with xarelto. [facility] to [facility] with worsening confusion over past 24 hours prior to this admission. Due to altered mental status patient is not able to provide details of present illness.   Patient was hemodynamically stable on the admission. She was afebrile. Her oxygen saturation was 87% on room air but this has quickly improved with slight nasal cannula oxygen support up to 100%. Her blood work showed WBC count 15.9, hemoglobin 9.7, potassium 5.5, normal troponin level, creatinine 3.82 (baseline 1.14 in 12/2014). UA showed small leukocytes and few WBC.   Assessment/Plan:       Acute encephalopathy / UTI / Leukocytosis  - Unclear etiology, possibly UTI - Her UA showed small leukocytes but she was not started on abx on admission - Will treat with empiric rocephin and hopefully mental status improves  - urine culture: NGTD - MRI brain:Unchanged, extensive supratentorial and infratentorial white matter disease consistent with multiple sclerosis.  - CT head showed no acute intracranial findings     Multiple sclerosis diagnosed 2002-not on Therapy any longer - Stable - Will return to SNF once stable     DVT, lower extremity (HCC) - Continue Lovenox sub Q - At home take xarelto - On Lovenox due to renal failure currently    Stage 4 decubitus ulcer ([place]) - Appreciate wound care assessment - Pressure injuries to the right heel, bilateral ischial tuberosities. Right heel stage 2 is new since her last admission. Macerated sacral area is without breakdown. -  Measurement:right heel 2.5cm x 2cm x 0.2cm with peeling tissue at periphery indicative of resolving blister. Right ischial tuberosity (Stage 3) 3cm x 2.cm x 0.4cm red wound base with serous exudate. Left IT (Stage 3): 2cm x 2cm x 0.5cm with moist red wound base, minimal serous exudate. Sacrum is intact with macerated tissues. - Dressing procedure/placement/frequency: Continue to focus on striking a balance between elevation of head of bed for swallowing purposes and keeping it below 30 degrees to heal the IT ulcerations. Stage 2 pressure injury on the right heel - ordered bilateral heel boots.    Diabetes mellitus type 2, controlled associated with CKD stage 3 without long term insulin use (HCC) - A1c is pending - On SSI - Metformin on hold     Acute renal failure superimposed on stage 3 chronic kidney disease (HCC) - Baseline Cr 1.14 since 12/2014 - Cr high on admission at 3.82, likely due to lasix, metformin, both meds on hold - Cr slowly improving- await today's BMP    Anemia of chronic disease - Due to anemia of chronic kidney disease - Monitor CBC daily    Hyperkalemia - Spontaneously resolved    Moderate protein calorie malnutrition - In the context of chronic illness - Nutrition consulted   DVT Prophylaxis  - On Lovenox now due to renal failure; at home on xarelto   Code Status: Full.  Family Communication:  Family not at the bedside Disposition Plan: SNF once CR better  IV access:  Peripheral IV  Procedures and diagnostic studies:    Dg Chest 1 View 07/20/2015 Slightly hypoinspiratory exam.  No active  disease.   Ct Head Wo Contrast 07/20/2015 1.  No acute intracranial abnormality. 2. Mild motion degradation. 3.  Cerebral atrophy and small vessel ischemic change.   Medical Consultants:  None   Other Consultants:  PT WOC  IAnti-Infectives:   Rocephin 07/21/2015 -->    Eustacio Ellen U Antara Brecheisen, DO  [facility] Pager [phone]  Time spent in minutes: 25  minutes  If 7PM-7AM, please contact night-coverage [email or web address] Password [facility] 07/22/2015, 12:51 PM   LOS: 2 days    HPI/Subjective: Mentation appears much improved Swallowing better   Objective: Filed Vitals:   07/21/15 1556 07/21/15 2059 07/22/15 0423 07/22/15 0937  BP: 98/53 100/47 111/46 112/57  Pulse: 79 69 72 102  Temp: 98.2 F (36.8 C) 98.6 F (37 C) 99 F (37.2 C) 99.6 F (37.6 C)  TempSrc: Oral Oral Oral Oral  Resp: 16 18 18 18   SpO2: 100% 100% 95% 97%    Intake/Output Summary (Last 24 hours) at 07/22/15 1251 Last data filed at 07/22/15 1200  Gross per 24 hour  Intake    340 ml  Output   3775 ml  Net  -3435 ml    Exam:   General:  oriented to person and place, not time  Cardiovascular: Regular rate and rhythm, S1/S2 (+)  Respiratory: No wheezing, no crackles, no rhonchi  Abdomen: Soft, non tender, non distended, bowel sounds present  Extremities: No cyanosis, pulses palpable bilaterally  Neuro: Grossly nonfocal  Data Reviewed: Basic Metabolic Panel:  Recent Labs Lab 07/20/15 1712 07/20/15 2320 07/21/15 0310  NA 141 142 143  K 5.5* 4.7 4.5  CL 106 112* 113*  CO2 23 21* 21*  GLUCOSE 114* 122* 104*  BUN 80* 79* 72*  CREATININE 3.82* 3.66* 3.24*  CALCIUM 10.1 9.2 9.2  MG  --   --  2.3  PHOS  --   --  5.0*   Liver Function Tests:  Recent Labs Lab 07/20/15 1712 07/21/15 0310  AST 13* 11*  ALT 14 10*  ALKPHOS 47 40  BILITOT 0.5 0.3  PROT 8.1 6.6  ALBUMIN 3.0* 2.5*   No results for input(s): LIPASE, AMYLASE in the last 168 hours.  Recent Labs Lab 07/20/15 1712  AMMONIA 21   CBC:  Recent Labs Lab 07/20/15 1712 07/21/15 0310 07/22/15 1156  WBC 15.9* 11.5* 12.1*  NEUTROABS 11.8*  --   --   HGB 9.7* 8.1* 8.5*  HCT 29.8* 25.7* 26.9*  MCV 85.6 88.3 88.5  PLT 666* 646* 651*   Cardiac Enzymes:  Recent Labs Lab 07/20/15 1712 07/20/15 2320 07/21/15 0310 07/21/15 0900  TROPONINI <0.03 <0.03 <0.03 <0.03    BNP: Invalid input(s): POCBNP CBG:  Recent Labs Lab 07/21/15 2103 07/21/15 2338 07/22/15 0306 07/22/15 0731 07/22/15 1141  GLUCAP 151* 96 91 128* 222*     Current facility-administered medications:  .  0.9 %  sodium chloride infusion, , Intravenous, Continuous, Jerusalem Brownstein U Ariana Juul, DO .  acetaminophen (TYLENOL) tablet 650 mg, 650 mg, Oral, Q6H PRN **OR** acetaminophen (TYLENOL) suppository 650 mg, 650 mg, Rectal, Q6H PRN, Toy Baker, MD .  albuterol (PROVENTIL) (2.5 MG/3ML) 0.083% nebulizer solution 2.5 mg, 2.5 mg, Inhalation, Q4H PRN, Toy Baker, MD .  antiseptic oral rinse (CPC / CETYLPYRIDINIUM CHLORIDE 0.05%) solution 7 mL, 7 mL, Mouth Rinse, q12n4p, Toy Baker, MD, 7 mL at 07/22/15 1221 .  baclofen (LIORESAL) tablet 10 mg, 10 mg, Oral, TID, Toy Baker, MD, 10 mg at 07/22/15 1220 .  bisacodyl (DULCOLAX) suppository 10 mg,  10 mg, Rectal, Daily PRN, Toy Baker, MD .  cefTRIAXone (ROCEPHIN) 1 g in dextrose 5 % 50 mL IVPB, 1 g, Intravenous, Q24H, Robbie Lis, MD, 1 g at 07/22/15 1221 .  chlorhexidine (PERIDEX) 0.12 % solution 15 mL, 15 mL, Mouth Rinse, BID, Toy Baker, MD, 15 mL at 07/22/15 1040 .  darifenacin (ENABLEX) 24 hr tablet 7.5 mg, 7.5 mg, Oral, Daily, Toy Baker, MD, 7.5 mg at 07/22/15 1220 .  enoxaparin (LOVENOX) injection 70 mg, 70 mg, Subcutaneous, Q24H, Robbie Lis, MD .  famotidine (PEPCID) tablet 20 mg, 20 mg, Oral, Daily, Robbie Lis, MD, 20 mg at 07/21/15 2112 .  feeding supplement (ENSURE ENLIVE) (ENSURE ENLIVE) liquid 237 mL, 237 mL, Oral, BID BM, Robbie Lis, MD, 237 mL at 07/22/15 1041 .  feeding supplement (PRO-STAT SUGAR FREE 64) liquid 30 mL, 30 mL, Oral, Daily, Robbie Lis, MD, 30 mL at 07/22/15 1225 .  gabapentin (NEURONTIN) capsule 100 mg, 100 mg, Oral, QHS, Toy Baker, MD, 100 mg at 07/21/15 2112 .  hydrocerin (EUCERIN) cream, , Topical, BID, Robbie Lis, MD, 1 application at [id number]  2113 .  HYDROcodone-acetaminophen (NORCO/VICODIN) 5-325 MG per tablet 1-2 tablet, 1-2 tablet, Oral, Q4H PRN, Toy Baker, MD, 2 tablet at 07/21/15 1732 .  insulin aspart (novoLOG) injection 0-9 Units, 0-9 Units, Subcutaneous, 6 times per day, Toy Baker, MD, 3 Units at 07/22/15 1220 .  Omega 3 CAPS 1,000 mg, 1,000 mg, Oral, Daily, Robbie Lis, MD, 1,000 mg at 07/21/15 1229 .  ondansetron (ZOFRAN) tablet 4 mg, 4 mg, Oral, Q6H PRN **OR** ondansetron (ZOFRAN) injection 4 mg, 4 mg, Intravenous, Q6H PRN, Toy Baker, MD .  pravastatin (PRAVACHOL) tablet 10 mg, 10 mg, Oral, q1800, Toy Baker, MD, 10 mg at 07/21/15 1733 .  sodium chloride flush (NS) 0.9 % injection 3 mL, 3 mL, Intravenous, Q12H, Toy Baker, MD, 3 mL at 07/22/15 1042

## 2015-07-22 NOTE — Progress Notes (Signed)
Speech Language Pathology Treatment: Dysphagia  Patient Details Name: [name] MRN: [id number] DOB: 26-Jul-1960 Today's Date: 07/22/2015 Time: 0349-1791 SLP Time Calculation (min) (ACUTE ONLY): 44 min  Assessment / Plan / Recommendation Clinical Impression  Pt with much improved mental status today and ability to orally transit/swallow today.  Pt did hold her own milk carton today and drink via straw. No indication of airway compromise or aspiration today.  She does continue with delayed oral transiting - up to 10 - 15 seconds.  SLP did not attempt solids with pt due to level of oral holding and increased aspiration risk.  Recommend to continue to encourage pt to self fed.  Positioning issues noted due to pt's pain with HOB elevation and wounds.    Recommend continue puree/thin diet with strict precautions.  Pt's dysphagia likely impacts her ability to meet nutritional needs.  Pt may benefit from follow up with dietician at facility to maximize nutritional intake.  Suspect pt's swallow ability is near baseline.    HPI HPI: 55 y.o. female with h/o DM, DVT, MS and is WC bound at baseline admitted with multiple sacral and buttock ulcers AMS from [facility]. Discharged from [facility] 07/09/15      SLP Plan  All goals met     Recommendations  Diet recommendations: Dysphagia 1 (puree);Thin liquid Liquids provided via: Straw Medication Administration: Whole meds with puree Supervision: Patient able to self feed Compensations: Slow rate;Small sips/bites;Other (Comment) (check for oral residuals) Postural Changes and/or Swallow Maneuvers: Out of bed for meals;Seated upright 90 degrees;Upright 30-60 min after meal             Oral Care Recommendations:  (oral care after po intake) Follow up Recommendations: Skilled Nursing facility (briefly for dysphagia treatment) Plan: All goals met     [place], [facility] SLP [phone]

## 2015-07-23 DIAGNOSIS — N179 Acute kidney failure, unspecified: Secondary | ICD-10-CM

## 2015-07-23 LAB — GLUCOSE, CAPILLARY
GLUCOSE-CAPILLARY: 107 mg/dL — AB (ref 65–99)
GLUCOSE-CAPILLARY: 128 mg/dL — AB (ref 65–99)
GLUCOSE-CAPILLARY: 157 mg/dL — AB (ref 65–99)

## 2015-07-23 MED ORDER — ACETAMINOPHEN 325 MG PO TABS: 650.0000 mg | ORAL_TABLET | Freq: Four times a day (QID) | ORAL | Status: DC | PRN

## 2015-07-23 MED ORDER — ENOXAPARIN SODIUM 80 MG/0.8ML ~~LOC~~ SOLN: 70.0000 mg | SUBCUTANEOUS | Status: DC

## 2015-07-23 MED ORDER — CETYLPYRIDINIUM CHLORIDE 0.05 % MT LIQD: 7.0000 mL | Freq: Two times a day (BID) | OROMUCOSAL | Status: DC

## 2015-07-23 MED ORDER — ENSURE ENLIVE PO LIQD
237.0000 mL | Freq: Two times a day (BID) | ORAL | Status: DC
Start: 2015-07-23 — End: 2015-07-27

## 2015-07-23 MED ORDER — AMOXICILLIN-POT CLAVULANATE 500-125 MG PO TABS
1.0000 | ORAL_TABLET | Freq: Two times a day (BID) | ORAL | Status: DC
  Administered 2015-07-23: 500 mg via ORAL
  Filled 2015-07-23 (×2): qty 1

## 2015-07-23 MED ORDER — HYDROCODONE-ACETAMINOPHEN 5-325 MG PO TABS: 1.0000 | ORAL_TABLET | Freq: Four times a day (QID) | ORAL | Status: DC | PRN

## 2015-07-23 MED ORDER — AMOXICILLIN-POT CLAVULANATE 500-125 MG PO TABS: 1.0000 | ORAL_TABLET | Freq: Two times a day (BID) | ORAL | Status: DC

## 2015-07-23 NOTE — Progress Notes (Signed)
Callback name and number left with Joslyn Devon, nurse at [facility].

## 2015-07-23 NOTE — Plan of Care (Signed)
Problem: Skin Integrity: Goal: Risk for impaired skin integrity will decrease Outcome: Not Met (add Reason) Patient has stage 3 pressure ulcers.

## 2015-07-23 NOTE — Care Management Important Message (Signed)
Important Message  Patient Details  Name: LEONETTA TINA MRN: [id number] Date of Birth: April 27, 1961   Medicare Important Message Given:  Yes    Camillo Flaming 07/23/2015, 10:33 AMImportant Message  Patient Details  Name: DOROTEA SKUFCA MRN: [id number] Date of Birth: [birth year]   Medicare Important Message Given:  Yes    Camillo Flaming 07/23/2015, 10:33 AM

## 2015-07-23 NOTE — Progress Notes (Signed)
Inpatient Diabetes Program Recommendations  AACE/ADA: New Consensus Statement on Inpatient Glycemic Control (2015)  Target Ranges:  Prepandial:   less than 140 mg/dL      Peak postprandial:   less than 180 mg/dL (1-2 hours)      Critically ill patients:  140 - 180 mg/dL   Results for [name], [name] (MRN [id number]) as of 07/23/2015 10:57  Ref. Range 07/21/2015 23:38 07/22/2015 03:06 07/22/2015 07:31 07/22/2015 11:41 07/22/2015 15:57 07/22/2015 20:01  Glucose-Capillary Latest Ref Range: 65-99 mg/dL 96 91 128 (H) 222 (H) 225 (H) 259 (H)   Results for [name], [name] (MRN [id number]) as of 07/23/2015 10:57  Ref. Range 07/20/2015 23:20  Hemoglobin A1C Latest Ref Range: 4.8-5.6 % 5.8 (H)    Home DM Meds: Metformin 500 mg bid  Current Insulin Orders: Novolog Sensitive SSI (0-9 units) Q4 hours      MD- Note patient started on PO diet yesterday.  Eating 100% of meals per documentation.  Having elevated postprandial glucose levels.  Please consider the following in-hospital insulin adjustments while home Metformin on hold-  1. Change Novolog Sensitive SSI to TID AC + HS (currently ordered Q4 hours)  2. Start low dose Novolog Meal Coverage- Novolog 3 units tidwc     --Will follow patient during hospitalization--  Wyn Quaker RN, MSN, CDE Diabetes Coordinator Inpatient Glycemic Control Team Team Pager: [phone] (8a-5p)

## 2015-07-23 NOTE — Progress Notes (Signed)
Gave shift report to Sherlynn Stalls, Therapist, sports.

## 2015-07-23 NOTE — Clinical Documentation Improvement (Signed)
Internal Medicine  Can the diagnosis of pressure ulcer be further specified? Progress notes:  Stage 4 decubitus ulcer ([place])  and  if POA?      Document Site with laterality - Elbow, Back (upper/lower), Sacral, Hip, Buttock, Ankle, Heel, Head, Other (Specify)  Other  Clinically Undetermined  Supporting Information:  WOC 07/21/2015 >Right heel stage 2                           >Right ischial tuberosity (Stage 3)                           >Left IT (Stage 3):                            >Sacrum is intact with macerated tissues.  Please exercise your independent, professional judgment when responding. A specific answer is not anticipated or expected. Please update your documentation within the medical record to reflect your response to this query. Thank you  Thank You,  [place] [phone]

## 2015-07-23 NOTE — Progress Notes (Signed)
Gave report to [place], LPN at [facility]. All questions answered.

## 2015-07-23 NOTE — Discharge Summary (Addendum)
Physician Discharge Summary  [name] [id number] DOB: Jul 20, 1960 DOA: 07/20/2015  PCP: [name], MD  Admit date: 07/20/2015 Discharge date: 07/23/2015  Time spent: 35 minutes  Recommendations for Outpatient Follow-up:  augmentin for 7 days Resume xarelto after Cr back to baseline of 1-- BMP 1 week after hospital d/c DYS 1 thin liquids Incentive spirometry Continued wound care Palliative services to evaluate at SNF   Discharge Diagnoses:  Principal Problem:   Acute encephalopathy Active Problems:   Multiple sclerosis diagnosed 2002-not on Therapy any longer   DVT, lower extremity (HCC)   Stage 4 decubitus ulcer ([place])   Diabetes mellitus type 2, controlled ([place])   Acute renal failure superimposed on stage 3 chronic kidney disease (HCC)   Leukocytosis   Anemia of chronic disease   Hyperkalemia   UTI (urinary tract infection)   Malnutrition of moderate degree   Discharge Condition: improved  Diet recommendation: DYS   There were no vitals filed for this visit.  History of present illness:  [name] is a 55 y.o. female   has a past medical history of MS (multiple sclerosis) ([place]); Hyperglycemia; Edema; High blood pressure; Diabetes mellitus without complication ([place]); and Pyelonephritis (12/16/2014).   Presented with worsening confusion patient [facility]. Per notes last time she was seen at her baseline which is alert and oriented 4 and Megan Salon to walk or three days ago irregular staff apparently weekend staff was not aware of patient's change. She is no longer answering any questions in meaningful fashion patient at her baseline has profound weakness of the lower extremity as well as some weakness of upper extremities but usually able to feed herself and left hand this is secondary to history of multiple sclerosis. His known history of multiple decubitus ulcers and has indwelling 40 catheter secondary to  urinary retention secondary to multiple sclerosis. Patient herself unable to provide any history   IN ER: On arrival patient was noted to have distended abdomen and decreased urine output. Foley was exchanged large amount of urine was obtained. Patient creatinine is up to 3.8 from baseline of 0.6. Potassium 5.5 white blood cell count was noted to be elevated at 15.9 UA showed large amount of red blood cells and some white blood cells but few bacteria only negative nitrites. ET tube the head showed no acute abnormality chest x-ray showed no evidence of cardiopulmonary disease.  Blood cultures were obtained, patient was afebrile, initial blood pressure 97/60 with administration of fluid bolus up to 122/68 lactic acid 1.75 Patient was given 1 L bolus   Regarding pertinent past history: Prior to January patient lived alone at home and was mobile with wheelchair and motorized chair. She has history of Multiple sclerosis for which she is no longer receiving treatment she is bedbound at baseline and recently has been discharged to nursing home. She has neurogenic bladder with chronic indwelling 40 catheter She has known history of sacral decubitus ulcer status post recent admission secondary to infection discharge and to nursing home 126 of January 2017 CT scan at that time did not show bony involvement. She is discharging to nursing home with plan to follow-up at wound care. Wound culture at that time showed no growth MRSA screen was negative patient was discharge on Cipro. 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 She has chronic history of anemia and prior history of GI bleeding secondary to NSAIDS currently been stable baseline hemoglobin is  around 9. Patient has chronically low blood pressures with her baseline running in 0000000 systolic. Regarding patient's diabetes is well controlled on by mouth glipizide. She has history of chronic edema likely secondary to hyperalbuminemia. Her last  albumin being 2.9  Hospitalist was called for admission for acute encephalopathy in a setting of acute renal failure due to urinary obstruction  Hospital Course:  Acute encephalopathy / Leukocytosis  - Unclear etiology - Her UA showed small leukocytes - Will treat with empirically for suspected aspiration PNA with augmentin as there was no UTI and patient was placed on special diet due to swallowing issues- x ray did not reveal x ray but was not a good study and patient was dehydrated - urine culture: NGTD - MRI brain:Unchanged, extensive supratentorial and infratentorial white matter disease consistent with multiple sclerosis.  - CT head showed no acute intracranial findings    Multiple sclerosis diagnosed 2002-not on Therapy any longer - Stable SNF   DVT, lower extremity (HCC) - Continue Lovenox sub Q - change back to xarelto once Cr back to baseline   Stage 3 decubitus ulcer (HCC) - Appreciate wound care assessment - Pressure injuries to the right heel, bilateral ischial tuberosities. Right heel stage 2 is new since her last admission. Macerated sacral area is without breakdown. - Measurement:right heel 2.5cm x 2cm x 0.2cm with peeling tissue at periphery indicative of resolving blister. Right ischial tuberosity (Stage 3) 3cm x 2.cm x 0.4cm red wound base with serous exudate. Left IT (Stage 3): 2cm x 2cm x 0.5cm with moist red wound base, minimal serous exudate. Sacrum is intact with macerated tissues. - Dressing procedure/placement/frequency: Continue to focus on striking a balance between elevation of head of bed for swallowing purposes and keeping it below 30 degrees to heal the IT ulcerations. Stage 2 pressure injury on the right heel - ordered bilateral heel boots.   Diabetes mellitus type 2, controlled associated with CKD stage 3 without long term insulin use (HCC) - A1c:5.8 - On SSI - Metformin resumed    Acute renal failure superimposed on stage 3 chronic  kidney disease (HCC) - Baseline Cr 1.14 since 12/2014 - Cr high on admission at 3.82, likely due to lasix, metformin, both meds on hold - Cr slowly improving   Anemia of chronic disease - Due to anemia of chronic kidney disease   Hyperkalemia -  resolved   Moderate protein calorie malnutrition - In the context of chronic illness - Nutrition consulted   Procedures:    Consultations:    Discharge Exam: Filed Vitals:   07/22/15 2211 07/23/15 0347  BP:  111/56  Pulse:  83  Temp: 99.3 F (37.4 C) 99.4 F (37.4 C)  Resp:  18    General: A+Ox3, NAD- no complaints   Discharge Instructions   Discharge Instructions    Discharge instructions    Complete by:  As directed   augmentin for 7 days Resume xarelto after Cr back to baseline of 1-- BMP 1 week after hospital d/c DYS 1 thin liquids Incentive spirometry     Increase activity slowly    Complete by:  As directed           Current Discharge Medication List    START taking these medications   Details  acetaminophen (TYLENOL) 325 MG tablet Take 2 tablets (650 mg total) by mouth every 6 (six) hours as needed for mild pain (or Fever >/= 101).    amoxicillin-clavulanate (AUGMENTIN) 500-125 MG tablet Take 1 tablet (  500 mg total) by mouth every 12 (twelve) hours.    antiseptic oral rinse (CPC / CETYLPYRIDINIUM CHLORIDE 0.05%) 0.05 % LIQD solution 7 mLs by Mouth Rinse route 2 times daily at 12 noon and 4 pm. Refills: 0    enoxaparin (LOVENOX) 80 MG/0.8ML injection Inject 0.7 mLs (70 mg total) into the skin daily. Qty: 0 Syringe      CONTINUE these medications which have CHANGED   Details  feeding supplement, ENSURE ENLIVE, (ENSURE ENLIVE) LIQD Take 237 mLs by mouth 2 (two) times daily between meals. Qty: 237 mL, Refills: 12    HYDROcodone-acetaminophen (NORCO) 5-325 MG tablet Take 1 tablet by mouth every 6 (six) hours as needed for moderate pain. Qty: 10 tablet, Refills: 0      CONTINUE these medications  which have NOT CHANGED   Details  albuterol (PROVENTIL HFA;VENTOLIN HFA) 108 (90 BASE) MCG/ACT inhaler Inhale 2 puffs into the lungs every 4 (four) hours as needed for wheezing or shortness of breath. Qty: 1 Inhaler, Refills: 4    Amino Acids-Protein Hydrolys (FEEDING SUPPLEMENT, PRO-STAT SUGAR FREE 64,) LIQD Take 30 mLs by mouth 2 (two) times daily. Qty: 900 mL, Refills: 0    baclofen (LIORESAL) 10 MG tablet Take 10 mg by mouth 3 (three) times daily.     bisacodyl (DULCOLAX) 10 MG suppository Place 10 mg rectally daily as needed for moderate constipation (only give 1 dose in 24 hours).    famotidine (PEPCID) 20 MG tablet Take 1 tablet (20 mg total) by mouth 2 (two) times daily. Qty: 30 tablet, Refills: 0    furosemide (LASIX) 40 MG tablet Take 1 tablet (40 mg total) by mouth daily as needed for fluid or edema. Reported on 07/05/2015 Qty: 30 tablet, Refills: 0    gabapentin (NEURONTIN) 100 MG capsule Take 100 mg by mouth at bedtime.    lovastatin (MEVACOR) 20 MG tablet Take 20 mg by mouth daily.    metFORMIN (GLUCOPHAGE) 500 MG tablet Take 500 mg by mouth 2 (two) times daily. Refills: 0    Multiple Vitamin (MULTIVITAMIN WITH MINERALS) TABS tablet Take 1 tablet by mouth daily. Qty: 30 tablet, Refills: 0    Omega 3 1000 MG CAPS Take 1,000 mg by mouth daily.    Sodium Phosphates (RA SALINE ENEMA RE) Place 1 application rectally daily as needed (severe constipation, do not exceed 1 dose in 24 hours, if still no relief contact MD).    solifenacin (VESICARE) 5 MG tablet Take 5 mg by mouth daily.    vitamin C (ASCORBIC ACID) 500 MG tablet Take 500 mg by mouth daily.      STOP taking these medications     ciprofloxacin (CIPRO) 500 MG tablet      magnesium hydroxide (MILK OF MAGNESIA) 400 MG/5ML suspension      rivaroxaban (XARELTO) 20 MG TABS tablet        No Known Allergies    The results of significant diagnostics from this hospitalization (including imaging, microbiology,  ancillary and laboratory) are listed below for reference.    Significant Diagnostic Studies: Dg Chest 1 View  07/20/2015  CLINICAL DATA:  Altered mental status. Currently uncooperative and combative. Being treated for multiple decubitus ulcers and has a Foley catheter. History of hypertension and diabetes. EXAM: CHEST 1 VIEW COMPARISON:  Chest x-rays dated 12/14/2014 and 06/25/2012. FINDINGS: Study is hypoinspiratory causing crowding of the bronchovascular and interstitial markings. Given the slightly low lung volumes, lungs are clear. No confluent opacity to suggest a  developing pneumonia. No pleural effusions seen. No pneumothorax. Heart size is normal. Overall cardiomediastinal silhouette is stable in size and configuration. Osseous and soft tissue structures about the chest are unremarkable. IMPRESSION: Slightly hypoinspiratory exam.  No active disease. Electronically Signed   By: Franki Cabot M.D.   On: 07/20/2015 16:41   Ct Head Wo Contrast  07/20/2015  CLINICAL DATA:  Altered mental status. EXAM: CT HEAD WITHOUT CONTRAST TECHNIQUE: Contiguous axial images were obtained from the base of the skull through the vertex without intravenous contrast. COMPARISON:  01/18/2010 FINDINGS: Sinuses/Soft tissues: Mild motion degradation throughout. Clear paranasal sinuses and mastoid air cells. Intracranial: Age advanced cerebral atrophy. Mild low density in the periventricular white matter likely related to small vessel disease. No mass lesion, hemorrhage, hydrocephalus, acute infarct, intra-axial, or extra-axial fluid collection. IMPRESSION: 1.  No acute intracranial abnormality. 2. Mild motion degradation. 3.  Cerebral atrophy and small vessel ischemic change. Electronically Signed   By: Abigail Miyamoto M.D.   On: 07/20/2015 16:57   Ct Pelvis W Contrast  07/05/2015  CLINICAL DATA:  New decubitus wound, current history of multiple sclerosis, diabetes, immobility EXAM: CT PELVIS WITH CONTRAST TECHNIQUE: Multidetector  CT imaging of the pelvis was performed using the standard protocol following the bolus administration of intravenous contrast. CONTRAST:  167mL OMNIPAQUE IOHEXOL 300 MG/ML  SOLN COMPARISON:  12/15/14 FINDINGS: Midline dorsal soft tissue wound extending from tip of coccyx to the dermis. Another soft tissue wound is seen on the left overlying the ischial tuberosity extending to the dermis. In this latter wound there may be a 12 mm rim enhancing fluid collection, suggesting a tiny abscess. No underlying osseous abnormalities suggesting osteomyelitis. No acute abnormalities within the pelvis. IMPRESSION: Evidence of 2 decubitus ulcers as described above with no findings to suggest osteomyelitis. Electronically Signed   By: Skipper Cliche M.D.   On: 07/05/2015 21:25   Mr Brain Wo Contrast  07/21/2015  CLINICAL DATA:  Altered mental status. Worsening confusion over the past 24 hours. History of multiple sclerosis. EXAM: MRI HEAD WITHOUT CONTRAST TECHNIQUE: Multiplanar, multiecho pulse sequences of the brain and surrounding structures were obtained without intravenous contrast. COMPARISON:  Head CT 07/20/2015 and MRI 10/05/2012 FINDINGS: The study is mildly motion degraded. There is no evidence of acute infarct, intracranial hemorrhage, mass, midline shift, or extra-axial fluid collection. There is a moderately advanced cerebral atrophy for age, similar to the prior MRI. Rather extensive T2 hyperintensities are present in the cerebral white matter bilaterally, not significantly changed from the prior MRI and with greatest involvement of the periventricular regions. Many lesions are in a perivenular distribution. There is involvement of the corpus callosum, which is atrophic. There is also prominent cerebellar and brainstem involvement. No restricted diffusion is identified within any of the lesions. Orbits are unremarkable on this nondedicated study. Paranasal sinuses and mastoid air cells are clear. Major intracranial  vascular flow voids are preserved. IMPRESSION: 1. No acute intracranial abnormality. 2. Unchanged, extensive supratentorial and infratentorial white matter disease consistent with multiple sclerosis. 3. Advanced cerebral atrophy. Electronically Signed   By: Logan Bores M.D.   On: 07/21/2015 20:58   US Renal  07/21/2015  CLINICAL DATA:  Acute renal failure. EXAM: RENAL / URINARY TRACT ULTRASOUND COMPLETE COMPARISON:  CT abdomen dated 12/15/2014. FINDINGS: Right Kidney: Length: 14.5 cm. Mild pelviectasis without frank hydronephrosis. Proximal right ureter appears distended which may be chronic. Echogenicity and thickness of the right renal cortex is within normal limits without mass or cyst. Left  Kidney: Length: 14.5 cm. Also pelviectasis without frank hydronephrosis. Left renal cyst measuring 3.8 x 3 cm. Echogenicity and thickness of the left renal cortex otherwise grossly normal. Large stone within the left renal pelvis measuring approximate 2.6 cm greatest dimension. Bladder: Decompressed by Foley catheter. Incidental note made of a small stone within the gallbladder. IMPRESSION: 1. Mild bilateral pelviectasis without frank hydronephrosis. 2. Distention of the proximal right ureter which may be chronic. 3. Left renal stone measuring approximately 2.6 cm, also seen on previous CT. 4. Cholelithiasis. Electronically Signed   By: Franki Cabot M.D.   On: 07/21/2015 12:47    Microbiology: Recent Results (from the past 240 hour(s))  Blood culture (routine x 2)     Status: None (Preliminary result)   Collection Time: 07/20/15  5:00 PM  Result Value Ref Range Status   Specimen Description BLOOD LEFT HAND  Final   Special Requests BOTTLES DRAWN AEROBIC AND ANAEROBIC 5 CC EACH  Final   Culture   Final    NO GROWTH 2 DAYS Performed at [facility]    Report Status PENDING  Incomplete  Blood culture (routine x 2)     Status: None (Preliminary result)   Collection Time: 07/20/15  5:00 PM  Result Value  Ref Range Status   Specimen Description BLOOD LEFT HAND  Final   Special Requests BOTTLES DRAWN AEROBIC ONLY 5CC  Final   Culture   Final    NO GROWTH 2 DAYS Performed at [facility]    Report Status PENDING  Incomplete  Urine culture     Status: None   Collection Time: 07/20/15  7:33 PM  Result Value Ref Range Status   Specimen Description URINE, CATHETERIZED  Final   Special Requests NONE  Final   Culture   Final    NO GROWTH 1 DAY Performed at [facility]    Report Status 07/21/2015 FINAL  Final  MRSA PCR Screening     Status: None   Collection Time: 07/20/15  9:35 PM  Result Value Ref Range Status   MRSA by PCR NEGATIVE NEGATIVE Final    Comment:        The GeneXpert MRSA Assay (FDA approved for NASAL specimens only), is one component of a comprehensive MRSA colonization surveillance program. It is not intended to diagnose MRSA infection nor to guide or monitor treatment for MRSA infections.      Labs: Basic Metabolic Panel:  Recent Labs Lab 07/20/15 1712 07/20/15 2320 07/21/15 0310 07/22/15 1156  NA 141 142 143 146*  K 5.5* 4.7 4.5 4.1  CL 106 112* 113* 115*  CO2 23 21* 21* 24  GLUCOSE 114* 122* 104* 246*  BUN 80* 79* 72* 41*  CREATININE 3.82* 3.66* 3.24* 1.27*  CALCIUM 10.1 9.2 9.2 9.7  MG  --   --  2.3  --   PHOS  --   --  5.0*  --    Liver Function Tests:  Recent Labs Lab 07/20/15 1712 07/21/15 0310  AST 13* 11*  ALT 14 10*  ALKPHOS 47 40  BILITOT 0.5 0.3  PROT 8.1 6.6  ALBUMIN 3.0* 2.5*   No results for input(s): LIPASE, AMYLASE in the last 168 hours.  Recent Labs Lab 07/20/15 1712  AMMONIA 21   CBC:  Recent Labs Lab 07/20/15 1712 07/21/15 0310 07/22/15 1156  WBC 15.9* 11.5* 12.1*  NEUTROABS 11.8*  --   --   HGB 9.7* 8.1* 8.5*  HCT 29.8* 25.7*  26.9*  MCV 85.6 88.3 88.5  PLT 666* 646* 651*   Cardiac Enzymes:  Recent Labs Lab 07/20/15 1712 07/20/15 2320 07/21/15 0310 07/21/15 0900  TROPONINI <0.03  <0.03 <0.03 <0.03   BNP: BNP (last 3 results)  Recent Labs  12/13/14 1620  BNP 56.9    ProBNP (last 3 results) No results for input(s): PROBNP in the last 8760 hours.  CBG:  Recent Labs Lab 07/22/15 2001 07/22/15 2316 07/23/15 0350 07/23/15 0727 07/23/15 1130  GLUCAP 259* 176* 107* 128* 157*       Signed:  JESSICA U VANN DO  [facility] 07/23/2015, 1:30 PM

## 2015-07-23 NOTE — Progress Notes (Signed)
Patient for d/c today to SNF bed at [facility]. Family and patient agreeable to this plan- plan transfer via EMS. Eduard Clos, MSW, Latanya Presser [phone]

## 2015-07-25 LAB — CULTURE, BLOOD (ROUTINE X 2)
CULTURE: NO GROWTH
Culture: NO GROWTH

## 2015-07-27 ENCOUNTER — Non-Acute Institutional Stay (SKILLED_NURSING_FACILITY): Payer: Medicare Other | Admitting: Internal Medicine

## 2015-07-27 ENCOUNTER — Encounter: Payer: Self-pay | Admitting: Internal Medicine

## 2015-07-27 DIAGNOSIS — R634 Abnormal weight loss: Secondary | ICD-10-CM | POA: Diagnosis not present

## 2015-07-27 DIAGNOSIS — I82402 Acute embolism and thrombosis of unspecified deep veins of left lower extremity: Secondary | ICD-10-CM | POA: Diagnosis not present

## 2015-07-27 DIAGNOSIS — E440 Moderate protein-calorie malnutrition: Secondary | ICD-10-CM | POA: Diagnosis not present

## 2015-07-27 DIAGNOSIS — E1122 Type 2 diabetes mellitus with diabetic chronic kidney disease: Secondary | ICD-10-CM | POA: Diagnosis not present

## 2015-07-27 DIAGNOSIS — L8993 Pressure ulcer of unspecified site, stage 3: Secondary | ICD-10-CM | POA: Diagnosis not present

## 2015-07-27 DIAGNOSIS — J690 Pneumonitis due to inhalation of food and vomit: Secondary | ICD-10-CM | POA: Diagnosis not present

## 2015-07-27 DIAGNOSIS — G35 Multiple sclerosis: Secondary | ICD-10-CM

## 2015-07-27 DIAGNOSIS — N181 Chronic kidney disease, stage 1: Secondary | ICD-10-CM | POA: Diagnosis not present

## 2015-07-27 DIAGNOSIS — E878 Other disorders of electrolyte and fluid balance, not elsewhere classified: Secondary | ICD-10-CM | POA: Diagnosis not present

## 2015-07-27 DIAGNOSIS — D649 Anemia, unspecified: Secondary | ICD-10-CM | POA: Diagnosis not present

## 2015-07-27 NOTE — Progress Notes (Signed)
Patient ID: [name], female   DOB: 12-07-1960, 55 y.o.   MRN: [id number]    HISTORY AND PHYSICAL   DATE: 07/27/15  Location:  [facility] Room Number: 924 A Place of Service: SNF (31)   Extended Emergency Contact Information Primary Emergency Contact: Paulino Rily, [place] [address] [place] of [place] Phone: [phone] Mobile Phone: [phone] Relation: Relative Secondary Emergency Contact: Charmayne Sheer, [place] [address] [place] of [place] Phone: [phone] Relation: Relative  Advanced Directive information Does patient have an advance directive?: No;Yes, Does patient want to make changes to advanced directive?: No - Patient declined  Chief Complaint  Patient presents with  . Readmit To SNF    Re-admit to facility    HPI:  55 yo female seen today as a readmission into SNF following hospital stay for acute encephalopathy, A/CKD stage 3, leukocytosis, hyperkalemia, UTI, moderate malnutrition, stage 4 sacral decub ulcer, anemia of chronic disease, hx DVT, hx MS dx in 2002, DM. Cr 3.8 on admission ---> 1.27 at d/c. CT head showed no acute process. MRI brain showed no changes. Hgb 8.5 at d/c. K+ 5.5 on admission --> 4.1 at d/c. She was tx for presumed aspiration pneumonia with Augmentin. xeralto held due to AKI and she was placed on lovenox injections. Wound care saw pt and sacral decub downgraded to stage 3.   Today, she reports she does not recall being in the hospital. She does not like her food pureed. No other concerns. Pain controlled. No nursing issues. No falls. She would like her teeth fixed. Weight has changed from 149 -->132 lbs in last few weeks  MS - takes neurontin, baclofen and HFA prn. She has a neurogenic bladder and a chronic indwelling foley cath. She takes vesicare  DM2 - takes metformin. No low BS reactions.  Hyperlipidemia - stable on lovastatin  AOCD - stable Hgb at d/c  8.5  Edema - stable on lasix  GERD - stable on pepcid  History reviewed. No pertinent past medical history.  Past Surgical History  Procedure Laterality Date  . Hernia mesh removal    . Tubes tided    . Tubal ligation    . Esophagogastroduodenoscopy Left 01/02/2015    Procedure: ESOPHAGOGASTRODUODENOSCOPY (EGD);  Surgeon: Arta Silence, MD;  Location: [facility];  Service: Endoscopy;  Laterality: Left;    Patient Care Team: [name], MD as PCP - General (Internal Medicine)  Social History   Social History  . Marital Status: Single    Spouse Name: N/A  . Number of Children: N/A  . Years of Education: N/A   Occupational History  . Not on file.   Social History Main Topics  . Smoking status: Former Smoker -- 1.00 packs/day for 20 years    Types: Cigarettes    Quit date: 11/19/2014  . Smokeless tobacco: Never Used  . Alcohol Use: 3.0 oz/week    5 Glasses of wine per week  . Drug Use: No  . Sexual Activity: Not Currently   Other Topics Concern  . 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              reports that she quit smoking about 8 months ago. Her  smoking use included Cigarettes. She has a 20 pack-year smoking history. She has never used smokeless tobacco. She reports that she drinks about 3.0 oz of alcohol per week. She reports that she does not use illicit drugs.  Family History  Problem Relation Age of Onset  . Diabetes Mother   . Diabetes Father   . Diabetes Sister   . Scoliosis Sister   . Alzheimer's disease Mother    Family Status  Relation Status Death Age  . Mother Alive   . Father Alive     Immunization History  Administered Date(s) Administered  . Influenza Split 06/26/2012  . PPD Test 01/04/2015, 07/09/2015, 07/23/2015    No Known Allergies  Medications: Patient's Medications  New Prescriptions   No  medications on file  Previous Medications   ALBUTEROL (PROVENTIL HFA;VENTOLIN HFA) 108 (90 BASE) MCG/ACT INHALER    Inhale 2 puffs into the lungs every 4 (four) hours as needed for wheezing or shortness of breath.   AMINO ACIDS-PROTEIN HYDROLYS (FEEDING SUPPLEMENT, PRO-STAT SUGAR FREE 64,) LIQD    Take 30 mLs by mouth 2 (two) times daily.   BACLOFEN (LIORESAL) 10 MG TABLET    Take 10 mg by mouth 3 (three) times daily.    BISACODYL (DULCOLAX) 10 MG SUPPOSITORY    Place 10 mg rectally daily as needed for moderate constipation (only give 1 dose in 24 hours).   CIPROFLOXACIN (CIPRO) 500 MG TABLET    Take 500 mg by mouth 2 (two) times daily.   FAMOTIDINE (PEPCID) 20 MG TABLET    Take 1 tablet (20 mg total) by mouth 2 (two) times daily.   FUROSEMIDE (LASIX) 40 MG TABLET    Take 1 tablet (40 mg total) by mouth daily as needed for fluid or edema. Reported on 07/05/2015   GABAPENTIN (NEURONTIN) 100 MG CAPSULE    Take 100 mg by mouth at bedtime.   HYDROCODONE-ACETAMINOPHEN (NORCO) 5-325 MG TABLET    Take 1 tablet by mouth every 6 (six) hours as needed for moderate pain.   LOVASTATIN (MEVACOR) 20 MG TABLET    Take 20 mg by mouth daily.   METFORMIN (GLUCOPHAGE) 500 MG TABLET    Take 500 mg by mouth 2 (two) times daily.   MULTIPLE VITAMIN (MULTIVITAMIN WITH MINERALS) TABS TABLET    Take 1 tablet by mouth daily.   OMEGA 3 1000 MG CAPS    Take 1,000 mg by mouth daily.   RIVAROXABAN (XARELTO) 20 MG TABS TABLET    Take 20 mg by mouth daily with supper.   SODIUM PHOSPHATES (RA SALINE ENEMA RE)    Place 1 application rectally daily as needed (severe constipation, do not exceed 1 dose in 24 hours, if still no relief contact MD).   SOLIFENACIN (VESICARE) 5 MG TABLET    Take 5 mg by mouth daily.   VITAMIN C (ASCORBIC ACID) 500 MG TABLET    Take 500 mg by mouth daily.  Modified Medications   No medications on file  Discontinued Medications   ACETAMINOPHEN (TYLENOL) 325 MG TABLET    Take 2 tablets (650 mg total) by  mouth every 6 (six) hours as needed for mild pain (or Fever >/= 101).   AMOXICILLIN-CLAVULANATE (AUGMENTIN) 500-125 MG TABLET    Take 1 tablet (500 mg total) by mouth every 12 (twelve) hours.   ANTISEPTIC ORAL RINSE (CPC / CETYLPYRIDINIUM CHLORIDE 0.05%) 0.05 % LIQD SOLUTION    7 mLs by Mouth Rinse route 2 times daily at 12 noon  and 4 pm.   ENOXAPARIN (LOVENOX) 80 MG/0.8ML INJECTION    Inject 0.7 mLs (70 mg total) into the skin daily.   FEEDING SUPPLEMENT, ENSURE ENLIVE, (ENSURE ENLIVE) LIQD    Take 237 mLs by mouth 2 (two) times daily between meals.    Review of Systems  Musculoskeletal: Positive for arthralgias.  Skin: Positive for wound.  Neurological: Positive for weakness.  All other systems reviewed and are negative.   Filed Vitals:   07/27/15 1120  BP: 131/77  Pulse: 77  Temp: 98 F (36.7 C)  TempSrc: Oral  Resp: 18  Height: '5\' 5"'  (1.651 m)  Weight: 132 lb 6.4 oz (60.056 kg)   Body mass index is 22.03 kg/(m^2).  Physical Exam  Constitutional: She appears well-developed.  Lying in bed in NAD. Frail appearing  HENT:  Mouth/Throat: Oropharynx is clear and moist. No oropharyngeal exudate.  Poor dentition  Eyes: Pupils are equal, round, and reactive to light. No scleral icterus.  Neck: Neck supple. Carotid bruit is not present. No tracheal deviation present. No thyromegaly present.  Cardiovascular: Normal rate, regular rhythm and intact distal pulses.  Exam reveals no gallop and no friction rub.   Murmur (1/6 SEM) heard. No LE edema b/l. no calf TTP.   Pulmonary/Chest: Effort normal and breath sounds normal. No stridor. No respiratory distress. She has no wheezes. She has no rales.  Abdominal: Soft. Bowel sounds are normal. She exhibits no distension and no mass. There is no hepatomegaly. There is no tenderness. There is no rebound and no guarding.  Genitourinary:  Foley DTG clear yelloow urine  Musculoskeletal:  Calf atrophy noted  Lymphadenopathy:    She has no  cervical adenopathy.  Neurological: She is alert.  B/l LE paraplegic  Skin: Skin is warm and dry. No rash noted.  B/l distal leg post inflammatory hyperpigmentation. Sacral wound dsg c/d/i  Psychiatric: She has a normal mood and affect. Her behavior is normal. Thought content normal.     Labs reviewed: Admission on 07/20/2015, Discharged on 07/23/2015  Component Date Value Ref Range Status  . Glucose-Capillary 07/20/2015 102* 65 - 99 mg/dL Final  . WBC 07/20/2015 15.9* 4.0 - 10.5 K/uL Final  . RBC 07/20/2015 3.48* 3.87 - 5.11 MIL/uL Final  . Hemoglobin 07/20/2015 9.7* 12.0 - 15.0 g/dL Final  . HCT 07/20/2015 29.8* 36.0 - 46.0 % Final  . MCV 07/20/2015 85.6  78.0 - 100.0 fL Final  . MCH 07/20/2015 27.9  26.0 - 34.0 pg Final  . MCHC 07/20/2015 32.6  30.0 - 36.0 g/dL Final  . RDW 07/20/2015 17.2* 11.5 - 15.5 % Final  . Platelets 07/20/2015 666* 150 - 400 K/uL Final  . Neutrophils Relative % 07/20/2015 75   Final  . Neutro Abs 07/20/2015 11.8* 1.7 - 7.7 K/uL Final  . Lymphocytes Relative 07/20/2015 14   Final  . Lymphs Abs 07/20/2015 2.2  0.7 - 4.0 K/uL Final  . Monocytes Relative 07/20/2015 8   Final  . Monocytes Absolute 07/20/2015 1.3* 0.1 - 1.0 K/uL Final  . Eosinophils Relative 07/20/2015 3   Final  . Eosinophils Absolute 07/20/2015 0.4  0.0 - 0.7 K/uL Final  . Basophils Relative 07/20/2015 0   Final  . Basophils Absolute 07/20/2015 0.1  0.0 - 0.1 K/uL Final  . Sodium 07/20/2015 141  135 - 145 mmol/L Final  . Potassium 07/20/2015 5.5* 3.5 - 5.1 mmol/L Final  . Chloride 07/20/2015 106  101 - 111 mmol/L Final  . CO2 07/20/2015 23  22 - 32 mmol/L Final  . Glucose, Bld 07/20/2015 114* 65 - 99 mg/dL Final  . BUN 07/20/2015 80* 6 - 20 mg/dL Final  . Creatinine, Ser 07/20/2015 3.82* 0.44 - 1.00 mg/dL Final  . Calcium 07/20/2015 10.1  8.9 - 10.3 mg/dL Final  . Total Protein 07/20/2015 8.1  6.5 - 8.1 g/dL Final  . Albumin 07/20/2015 3.0* 3.5 - 5.0 g/dL Final  . AST 07/20/2015 13* 15  - 41 U/L Final  . ALT 07/20/2015 14  14 - 54 U/L Final  . Alkaline Phosphatase 07/20/2015 47  38 - 126 U/L Final  . Total Bilirubin 07/20/2015 0.5  0.3 - 1.2 mg/dL Final  . GFR calc non Af Amer 07/20/2015 12* >60 mL/min Final  . GFR calc Af Amer 07/20/2015 14* >60 mL/min Final   Comment: (NOTE) The eGFR has been calculated using the CKD EPI equation. This calculation has not been validated in all clinical situations. eGFR's persistently <60 mL/min signify possible Chronic Kidney Disease.   . Anion gap 07/20/2015 12  5 - 15 Final  . Ammonia 07/20/2015 21  9 - 35 umol/L Final  . Lactic Acid, Venous 07/20/2015 1.75  0.5 - 2.0 mmol/L Final  . Troponin I 07/20/2015 <0.03  <0.031 ng/mL Final   Comment:        NO INDICATION OF MYOCARDIAL INJURY.   . Color, Urine 07/20/2015 YELLOW  YELLOW Final  . APPearance 07/20/2015 CLOUDY* CLEAR Final  . Specific Gravity, Urine 07/20/2015 1.013  1.005 - 1.030 Final  . pH 07/20/2015 5.5  5.0 - 8.0 Final  . Glucose, UA 07/20/2015 NEGATIVE  NEGATIVE mg/dL Final  . Hgb urine dipstick 07/20/2015 LARGE* NEGATIVE Final  . Bilirubin Urine 07/20/2015 NEGATIVE  NEGATIVE Final  . Ketones, ur 07/20/2015 NEGATIVE  NEGATIVE mg/dL Final  . Protein, ur 07/20/2015 30* NEGATIVE mg/dL Final  . Nitrite 07/20/2015 NEGATIVE  NEGATIVE Final  . Leukocytes, UA 07/20/2015 SMALL* NEGATIVE Final  . Specimen Description 07/20/2015 BLOOD LEFT HAND   Final  . Special Requests 07/20/2015 BOTTLES DRAWN AEROBIC AND ANAEROBIC 5 CC EACH   Final  . Culture 07/20/2015    Final                   Value:NO GROWTH 5 DAYS Performed at [facility]   . Report Status 07/20/2015 07/25/2015 FINAL   Final  . Specimen Description 07/20/2015 BLOOD LEFT HAND   Final  . Special Requests 07/20/2015 BOTTLES DRAWN AEROBIC ONLY 5CC   Final  . Culture 07/20/2015    Final                   Value:NO GROWTH 5 DAYS Performed at [facility]   . Report Status 07/20/2015 07/25/2015 FINAL    Final  . Opiates 07/20/2015 POSITIVE* NONE DETECTED Final  . Cocaine 07/20/2015 NONE DETECTED  NONE DETECTED Final  . Benzodiazepines 07/20/2015 NONE DETECTED  NONE DETECTED Final  . Amphetamines 07/20/2015 NONE DETECTED  NONE DETECTED Final  . Tetrahydrocannabinol 07/20/2015 NONE DETECTED  NONE DETECTED Final  . Barbiturates 07/20/2015 NONE DETECTED  NONE DETECTED Final   Comment:        DRUG SCREEN FOR MEDICAL PURPOSES ONLY.  IF CONFIRMATION IS NEEDED FOR ANY PURPOSE, NOTIFY LAB WITHIN 5 DAYS.        LOWEST DETECTABLE LIMITS FOR URINE DRUG SCREEN Drug Class       Cutoff (ng/mL) Amphetamine      1000 Barbiturate  200 Benzodiazepine   710 Tricyclics       626 Opiates          300 Cocaine          300 THC              50   . Salicylate Lvl 94/85/4627 <4.0  2.8 - 30.0 mg/dL Final  . Acetaminophen (Tylenol), Serum 07/20/2015 <10* 10 - 30 ug/mL Final   Comment:        THERAPEUTIC CONCENTRATIONS VARY SIGNIFICANTLY. A RANGE OF 10-30 ug/mL MAY BE AN EFFECTIVE CONCENTRATION FOR MANY PATIENTS. HOWEVER, SOME ARE BEST TREATED AT CONCENTRATIONS OUTSIDE THIS RANGE. ACETAMINOPHEN CONCENTRATIONS >150 ug/mL AT 4 HOURS AFTER INGESTION AND >50 ug/mL AT 12 HOURS AFTER INGESTION ARE OFTEN ASSOCIATED WITH TOXIC REACTIONS.   Marland Kitchen Alcohol, Ethyl (B) 07/20/2015 <5  <5 mg/dL Final   Comment:        LOWEST DETECTABLE LIMIT FOR SERUM ALCOHOL IS 5 mg/dL FOR MEDICAL PURPOSES ONLY   . Squamous Epithelial / LPF 07/20/2015 0-5* NONE SEEN Final  . WBC, UA 07/20/2015 6-30  0 - 5 WBC/hpf Final  . RBC / HPF 07/20/2015 TOO NUMEROUS TO COUNT  0 - 5 RBC/hpf Final  . Bacteria, UA 07/20/2015 FEW* NONE SEEN Final  . Crystals 07/20/2015 CA OXALATE CRYSTALS* NEGATIVE Final  . Urine-Other 07/20/2015 MUCOUS PRESENT   Final  . Specimen Description 07/20/2015 URINE, CATHETERIZED   Final  . Special Requests 07/20/2015 NONE   Final  . Culture 07/20/2015    Final                   Value:NO GROWTH 1  DAY Performed at [facility]   . Report Status 07/20/2015 07/21/2015 FINAL   Final  . Hgb A1c MFr Bld 07/20/2015 5.8* 4.8 - 5.6 % Final   Comment: (NOTE)         Pre-diabetes: 5.7 - 6.4         Diabetes: >6.4         Glycemic control for adults with diabetes: <7.0   . Mean Plasma Glucose 07/20/2015 120   Final   Comment: (NOTE) Performed At: [facility] [place], [place] [id number] Lindon Romp MD WE:[id number]   . Magnesium 07/21/2015 2.3  1.7 - 2.4 mg/dL Final  . Phosphorus 07/21/2015 5.0* 2.5 - 4.6 mg/dL Final  . TSH 07/21/2015 1.787  0.350 - 4.500 uIU/mL Final  . Sodium 07/21/2015 143  135 - 145 mmol/L Final  . Potassium 07/21/2015 4.5  3.5 - 5.1 mmol/L Final  . Chloride 07/21/2015 113* 101 - 111 mmol/L Final  . CO2 07/21/2015 21* 22 - 32 mmol/L Final  . Glucose, Bld 07/21/2015 104* 65 - 99 mg/dL Final  . BUN 07/21/2015 72* 6 - 20 mg/dL Final  . Creatinine, Ser 07/21/2015 3.24* 0.44 - 1.00 mg/dL Final  . Calcium 07/21/2015 9.2  8.9 - 10.3 mg/dL Final  . Total Protein 07/21/2015 6.6  6.5 - 8.1 g/dL Final  . Albumin 07/21/2015 2.5* 3.5 - 5.0 g/dL Final  . AST 07/21/2015 11* 15 - 41 U/L Final  . ALT 07/21/2015 10* 14 - 54 U/L Final  . Alkaline Phosphatase 07/21/2015 40  38 - 126 U/L Final  . Total Bilirubin 07/21/2015 0.3  0.3 - 1.2 mg/dL Final  . GFR calc non Af Amer 07/21/2015 15* >60 mL/min Final  . GFR calc Af Amer 07/21/2015 17* >60 mL/min Final   Comment: (NOTE) The  eGFR has been calculated using the CKD EPI equation. This calculation has not been validated in all clinical situations. eGFR's persistently <60 mL/min signify possible Chronic Kidney Disease.   . Anion gap 07/21/2015 9  5 - 15 Final  . WBC 07/21/2015 11.5* 4.0 - 10.5 K/uL Final  . RBC 07/21/2015 2.91* 3.87 - 5.11 MIL/uL Final  . Hemoglobin 07/21/2015 8.1* 12.0 - 15.0 g/dL Final  . HCT 07/21/2015 25.7* 36.0 - 46.0 % Final  . MCV 07/21/2015 88.3  78.0 - 100.0 fL Final   . MCH 07/21/2015 27.8  26.0 - 34.0 pg Final  . MCHC 07/21/2015 31.5  30.0 - 36.0 g/dL Final  . RDW 07/21/2015 17.7* 11.5 - 15.5 % Final  . Platelets 07/21/2015 646* 150 - 400 K/uL Final  . Troponin I 07/20/2015 <0.03  <0.031 ng/mL Final   Comment:        NO INDICATION OF MYOCARDIAL INJURY.   . Troponin I 07/21/2015 <0.03  <0.031 ng/mL Final   Comment:        NO INDICATION OF MYOCARDIAL INJURY.   . Troponin I 07/21/2015 <0.03  <0.031 ng/mL Final   Comment:        NO INDICATION OF MYOCARDIAL INJURY. REPEATED TO VERIFY   . Sodium, Ur 07/21/2015 69   Final   Performed at [facility]  . Creatinine, Urine 07/21/2015 40.84   Final   Performed at [facility]  . Sodium 07/20/2015 142  135 - 145 mmol/L Final  . Potassium 07/20/2015 4.7  3.5 - 5.1 mmol/L Final  . Chloride 07/20/2015 112* 101 - 111 mmol/L Final  . CO2 07/20/2015 21* 22 - 32 mmol/L Final  . Glucose, Bld 07/20/2015 122* 65 - 99 mg/dL Final  . BUN 07/20/2015 79* 6 - 20 mg/dL Final  . Creatinine, Ser 07/20/2015 3.66* 0.44 - 1.00 mg/dL Final  . Calcium 07/20/2015 9.2  8.9 - 10.3 mg/dL Final  . GFR calc non Af Amer 07/20/2015 13* >60 mL/min Final  . GFR calc Af Amer 07/20/2015 15* >60 mL/min Final   Comment: (NOTE) The eGFR has been calculated using the CKD EPI equation. This calculation has not been validated in all clinical situations. eGFR's persistently <60 mL/min signify possible Chronic Kidney Disease.   . Anion gap 07/20/2015 9  5 - 15 Final  . Prealbumin 07/21/2015 13.9* 18 - 38 mg/dL Final   Performed at [facility]  . MRSA by PCR 07/20/2015 NEGATIVE  NEGATIVE Final   Comment:        The GeneXpert MRSA Assay (FDA approved for NASAL specimens only), is one component of a comprehensive MRSA colonization surveillance program. It is not intended to diagnose MRSA infection nor to guide or monitor treatment for MRSA infections.   . Glucose-Capillary 07/20/2015 105* 65 - 99 mg/dL Final   . Glucose-Capillary 07/21/2015 143* 65 - 99 mg/dL Final  . Glucose-Capillary 07/21/2015 97  65 - 99 mg/dL Final  . Glucose-Capillary 07/21/2015 94  65 - 99 mg/dL Final  . Glucose-Capillary 07/21/2015 135* 65 - 99 mg/dL Final  . Glucose-Capillary 07/21/2015 106* 65 - 99 mg/dL Final  . Glucose-Capillary 07/21/2015 151* 65 - 99 mg/dL Final  . Glucose-Capillary 07/21/2015 96  65 - 99 mg/dL Final  . Glucose-Capillary 07/22/2015 91  65 - 99 mg/dL Final  . Glucose-Capillary 07/22/2015 128* 65 - 99 mg/dL Final  . WBC 07/22/2015 12.1* 4.0 - 10.5 K/uL Final  . RBC 07/22/2015 3.04* 3.87 - 5.11 MIL/uL Final  .  Hemoglobin 07/22/2015 8.5* 12.0 - 15.0 g/dL Final  . HCT 07/22/2015 26.9* 36.0 - 46.0 % Final  . MCV 07/22/2015 88.5  78.0 - 100.0 fL Final  . MCH 07/22/2015 28.0  26.0 - 34.0 pg Final  . MCHC 07/22/2015 31.6  30.0 - 36.0 g/dL Final  . RDW 07/22/2015 17.5* 11.5 - 15.5 % Final  . Platelets 07/22/2015 651* 150 - 400 K/uL Final  . Sodium 07/22/2015 146* 135 - 145 mmol/L Final  . Potassium 07/22/2015 4.1  3.5 - 5.1 mmol/L Final  . Chloride 07/22/2015 115* 101 - 111 mmol/L Final  . CO2 07/22/2015 24  22 - 32 mmol/L Final  . Glucose, Bld 07/22/2015 246* 65 - 99 mg/dL Final  . BUN 07/22/2015 41* 6 - 20 mg/dL Final  . Creatinine, Ser 07/22/2015 1.27* 0.44 - 1.00 mg/dL Final   Comment: REPEATED TO VERIFY DELTA CHECK NOTED   . Calcium 07/22/2015 9.7  8.9 - 10.3 mg/dL Final  . GFR calc non Af Amer 07/22/2015 44* >60 mL/min Final  . GFR calc Af Amer 07/22/2015 51* >60 mL/min Final   Comment: (NOTE) The eGFR has been calculated using the CKD EPI equation. This calculation has not been validated in all clinical situations. eGFR's persistently <60 mL/min signify possible Chronic Kidney Disease.   . Anion gap 07/22/2015 7  5 - 15 Final  . Glucose-Capillary 07/22/2015 222* 65 - 99 mg/dL Final  . Glucose-Capillary 07/22/2015 225* 65 - 99 mg/dL Final  . Glucose-Capillary 07/22/2015 259* 65 - 99  mg/dL Final  . Glucose-Capillary 07/22/2015 176* 65 - 99 mg/dL Final  . Glucose-Capillary 07/23/2015 107* 65 - 99 mg/dL Final  . Glucose-Capillary 07/23/2015 128* 65 - 99 mg/dL Final  . Glucose-Capillary 07/23/2015 157* 65 - 99 mg/dL Final  Admission on 07/05/2015, Discharged on 07/09/2015  Component Date Value Ref Range Status  . WBC 07/05/2015 11.4* 4.0 - 10.5 K/uL Final  . RBC 07/05/2015 3.28* 3.87 - 5.11 MIL/uL Final  . Hemoglobin 07/05/2015 9.1* 12.0 - 15.0 g/dL Final  . HCT 07/05/2015 29.1* 36.0 - 46.0 % Final  . MCV 07/05/2015 88.7  78.0 - 100.0 fL Final  . MCH 07/05/2015 27.7  26.0 - 34.0 pg Final  . MCHC 07/05/2015 31.3  30.0 - 36.0 g/dL Final  . RDW 07/05/2015 17.2* 11.5 - 15.5 % Final  . Platelets 07/05/2015 481* 150 - 400 K/uL Final  . Neutrophils Relative % 07/05/2015 59   Final  . Neutro Abs 07/05/2015 6.6  1.7 - 7.7 K/uL Final  . Lymphocytes Relative 07/05/2015 30   Final  . Lymphs Abs 07/05/2015 3.5  0.7 - 4.0 K/uL Final  . Monocytes Relative 07/05/2015 10   Final  . Monocytes Absolute 07/05/2015 1.1* 0.1 - 1.0 K/uL Final  . Eosinophils Relative 07/05/2015 1   Final  . Eosinophils Absolute 07/05/2015 0.1  0.0 - 0.7 K/uL Final  . Basophils Relative 07/05/2015 0   Final  . Basophils Absolute 07/05/2015 0.1  0.0 - 0.1 K/uL Final  . Sodium 07/05/2015 144  135 - 145 mmol/L Final  . Potassium 07/05/2015 4.1  3.5 - 5.1 mmol/L Final  . Chloride 07/05/2015 112* 101 - 111 mmol/L Final  . CO2 07/05/2015 25  22 - 32 mmol/L Final  . Glucose, Bld 07/05/2015 168* 65 - 99 mg/dL Final  . BUN 07/05/2015 21* 6 - 20 mg/dL Final  . Creatinine, Ser 07/05/2015 0.65  0.44 - 1.00 mg/dL Final  . Calcium 07/05/2015 10.3  8.9 -  10.3 mg/dL Final  . Total Protein 07/05/2015 7.5  6.5 - 8.1 g/dL Final  . Albumin 07/05/2015 2.9* 3.5 - 5.0 g/dL Final  . AST 07/05/2015 12* 15 - 41 U/L Final  . ALT 07/05/2015 10* 14 - 54 U/L Final  . Alkaline Phosphatase 07/05/2015 52  38 - 126 U/L Final  . Total  Bilirubin 07/05/2015 0.5  0.3 - 1.2 mg/dL Final  . GFR calc non Af Amer 07/05/2015 >60  >60 mL/min Final  . GFR calc Af Amer 07/05/2015 >60  >60 mL/min Final   Comment: (NOTE) The eGFR has been calculated using the CKD EPI equation. This calculation has not been validated in all clinical situations. eGFR's persistently <60 mL/min signify possible Chronic Kidney Disease.   . Anion gap 07/05/2015 7  5 - 15 Final  . Lactic Acid, Venous 07/05/2015 0.76  0.5 - 2.0 mmol/L Final  . Specimen Description 07/05/2015 URINE, CATHETERIZED   Final  . Special Requests 07/05/2015 NONE   Final  . Culture 07/05/2015    Final                   Value:MULTIPLE SPECIES PRESENT, SUGGEST RECOLLECTION Performed at [facility]   . Report Status 07/05/2015 07/07/2015 FINAL   Final  . Color, Urine 07/05/2015 YELLOW  YELLOW Final  . APPearance 07/05/2015 TURBID* CLEAR Final  . Specific Gravity, Urine 07/05/2015 1.017  1.005 - 1.030 Final  . pH 07/05/2015 8.5* 5.0 - 8.0 Final  . Glucose, UA 07/05/2015 NEGATIVE  NEGATIVE mg/dL Final  . Hgb urine dipstick 07/05/2015 MODERATE* NEGATIVE Final  . Bilirubin Urine 07/05/2015 NEGATIVE  NEGATIVE Final  . Ketones, ur 07/05/2015 NEGATIVE  NEGATIVE mg/dL Final  . Protein, ur 07/05/2015 30* NEGATIVE mg/dL Final  . Nitrite 07/05/2015 POSITIVE* NEGATIVE Final  . Leukocytes, UA 07/05/2015 LARGE* NEGATIVE Final  . Squamous Epithelial / LPF 07/05/2015 0-5* NONE SEEN Final  . WBC, UA 07/05/2015 TOO NUMEROUS TO COUNT  0 - 5 WBC/hpf Final  . RBC / HPF 07/05/2015 6-30  0 - 5 RBC/hpf Final  . Bacteria, UA 07/05/2015 MANY* NONE SEEN Final  . Crystals 07/05/2015 TRIPLE PHOSPHATE CRYSTALS* NEGATIVE Final  . Sodium 07/06/2015 142  135 - 145 mmol/L Final  . Potassium 07/06/2015 3.7  3.5 - 5.1 mmol/L Final  . Chloride 07/06/2015 111  101 - 111 mmol/L Final  . CO2 07/06/2015 25  22 - 32 mmol/L Final  . Glucose, Bld 07/06/2015 157* 65 - 99 mg/dL Final  . BUN 07/06/2015 18  6 - 20  mg/dL Final  . Creatinine, Ser 07/06/2015 0.62  0.44 - 1.00 mg/dL Final  . Calcium 07/06/2015 9.4  8.9 - 10.3 mg/dL Final  . GFR calc non Af Amer 07/06/2015 >60  >60 mL/min Final  . GFR calc Af Amer 07/06/2015 >60  >60 mL/min Final   Comment: (NOTE) The eGFR has been calculated using the CKD EPI equation. This calculation has not been validated in all clinical situations. eGFR's persistently <60 mL/min signify possible Chronic Kidney Disease.   . Anion gap 07/06/2015 6  5 - 15 Final  . WBC 07/06/2015 10.0  4.0 - 10.5 K/uL Final  . RBC 07/06/2015 2.80* 3.87 - 5.11 MIL/uL Final  . Hemoglobin 07/06/2015 7.9* 12.0 - 15.0 g/dL Final  . HCT 07/06/2015 24.8* 36.0 - 46.0 % Final  . MCV 07/06/2015 88.6  78.0 - 100.0 fL Final  . MCH 07/06/2015 28.2  26.0 - 34.0 pg Final  .  MCHC 07/06/2015 31.9  30.0 - 36.0 g/dL Final  . RDW 07/06/2015 17.2* 11.5 - 15.5 % Final  . Platelets 07/06/2015 414* 150 - 400 K/uL Final  . Heparin Unfractionated 07/05/2015 <0.10* 0.30 - 0.70 IU/mL Final   Comment:        IF HEPARIN RESULTS ARE BELOW EXPECTED VALUES, AND PATIENT DOSAGE HAS BEEN CONFIRMED, SUGGEST FOLLOW UP TESTING OF ANTITHROMBIN III LEVELS.   Marland Kitchen Prothrombin Time 07/05/2015 14.8  11.6 - 15.2 seconds Final  . INR 07/05/2015 1.14  0.00 - 1.49 Final  . aPTT 07/05/2015 32  24 - 37 seconds Final  . Glucose-Capillary 07/05/2015 156* 65 - 99 mg/dL Final  . Heparin Unfractionated 07/06/2015 0.24* 0.30 - 0.70 IU/mL Final   Comment:        IF HEPARIN RESULTS ARE BELOW EXPECTED VALUES, AND PATIENT DOSAGE HAS BEEN CONFIRMED, SUGGEST FOLLOW UP TESTING OF ANTITHROMBIN III LEVELS.   . Lactic Acid, Venous 07/06/2015 0.9  0.5 - 2.0 mmol/L Final  . ABO/RH(D) 07/06/2015 A POS   Final  . Antibody Screen 07/06/2015 NEG   Final  . Sample Expiration 07/06/2015 07/09/2015   Final  . Glucose-Capillary 07/06/2015 143* 65 - 99 mg/dL Final  . Comment 1 07/06/2015 Notify RN   Final  . Comment 2 07/06/2015 Document in  Chart   Final  . Glucose-Capillary 07/06/2015 191* 65 - 99 mg/dL Final  . Comment 1 07/06/2015 Notify RN   Final  . Comment 2 07/06/2015 Document in Chart   Final  . Heparin Unfractionated 07/06/2015 0.30  0.30 - 0.70 IU/mL Final   Comment:        IF HEPARIN RESULTS ARE BELOW EXPECTED VALUES, AND PATIENT DOSAGE HAS BEEN CONFIRMED, SUGGEST FOLLOW UP TESTING OF ANTITHROMBIN III LEVELS.   . Glucose-Capillary 07/06/2015 79  65 - 99 mg/dL Final  . Comment 1 07/06/2015 Notify RN   Final  . Comment 2 07/06/2015 Document in Chart   Final  . Heparin Unfractionated 07/07/2015 0.39  0.30 - 0.70 IU/mL Final   Comment:        IF HEPARIN RESULTS ARE BELOW EXPECTED VALUES, AND PATIENT DOSAGE HAS BEEN CONFIRMED, SUGGEST FOLLOW UP TESTING OF ANTITHROMBIN III LEVELS.   . Glucose-Capillary 07/06/2015 106* 65 - 99 mg/dL Final  . Comment 1 07/06/2015 Notify RN   Final  . Heparin Unfractionated 07/07/2015 0.49  0.30 - 0.70 IU/mL Final   Comment:        IF HEPARIN RESULTS ARE BELOW EXPECTED VALUES, AND PATIENT DOSAGE HAS BEEN CONFIRMED, SUGGEST FOLLOW UP TESTING OF ANTITHROMBIN III LEVELS.   . Glucose-Capillary 07/07/2015 45* 65 - 99 mg/dL Final  . Comment 1 07/07/2015 Notify RN   Final  . Glucose-Capillary 07/07/2015 46* 65 - 99 mg/dL Final  . Comment 1 07/07/2015 Notify RN   Final  . Comment 2 07/07/2015 Document in Chart   Final  . Glucose-Capillary 07/07/2015 86  65 - 99 mg/dL Final  . Comment 1 07/07/2015 Notify RN   Final  . Comment 2 07/07/2015 Document in Chart   Final  . Glucose-Capillary 07/07/2015 171* 65 - 99 mg/dL Final  . Glucose-Capillary 07/07/2015 73  65 - 99 mg/dL Final  . Comment 1 07/07/2015 Notify RN   Final  . Comment 2 07/07/2015 Document in Chart   Final  . Glucose-Capillary 07/07/2015 58* 65 - 99 mg/dL Final  . Comment 1 07/07/2015 Notify RN   Final  . Comment 2 07/07/2015 Document in Chart  Final  . Glucose-Capillary 07/07/2015 128* 65 - 99 mg/dL Final  . Comment  1 07/07/2015 Notify RN   Final  . Glucose-Capillary 07/08/2015 134* 65 - 99 mg/dL Final  . Vancomycin Tr 07/08/2015 13  10.0 - 20.0 ug/mL Final  . MRSA by PCR 07/08/2015 NEGATIVE  NEGATIVE Final   Comment:        The GeneXpert MRSA Assay (FDA approved for NASAL specimens only), is one component of a comprehensive MRSA colonization surveillance program. It is not intended to diagnose MRSA infection nor to guide or monitor treatment for MRSA infections.   Marland Kitchen Specimen Description 07/08/2015 BUTTOCKS LEFT   Final  . Special Requests 07/08/2015 NONE   Final  . Gram Stain 07/08/2015    Final                   Value:NO WBC SEEN NO SQUAMOUS EPITHELIAL CELLS SEEN NO ORGANISMS SEEN Performed at [facility]   . Culture 07/08/2015    Final                   Value:NO GROWTH 2 DAYS Performed at [facility]   . Report Status 07/08/2015 07/11/2015 FINAL   Final  . Glucose-Capillary 07/08/2015 161* 65 - 99 mg/dL Final  . Glucose-Capillary 07/08/2015 107* 65 - 99 mg/dL Final  . WBC 07/09/2015 11.0* 4.0 - 10.5 K/uL Final  . RBC 07/09/2015 3.17* 3.87 - 5.11 MIL/uL Final  . Hemoglobin 07/09/2015 8.9* 12.0 - 15.0 g/dL Final  . HCT 07/09/2015 28.2* 36.0 - 46.0 % Final  . MCV 07/09/2015 89.0  78.0 - 100.0 fL Final  . MCH 07/09/2015 28.1  26.0 - 34.0 pg Final  . MCHC 07/09/2015 31.6  30.0 - 36.0 g/dL Final  . RDW 07/09/2015 17.2* 11.5 - 15.5 % Final  . Platelets 07/09/2015 454* 150 - 400 K/uL Final  . Sodium 07/09/2015 141  135 - 145 mmol/L Final  . Potassium 07/09/2015 3.9  3.5 - 5.1 mmol/L Final  . Chloride 07/09/2015 112* 101 - 111 mmol/L Final  . CO2 07/09/2015 22  22 - 32 mmol/L Final  . Glucose, Bld 07/09/2015 67  65 - 99 mg/dL Final  . BUN 07/09/2015 11  6 - 20 mg/dL Final  . Creatinine, Ser 07/09/2015 0.67  0.44 - 1.00 mg/dL Final  . Calcium 07/09/2015 9.7  8.9 - 10.3 mg/dL Final  . GFR calc non Af Amer 07/09/2015 >60  >60 mL/min Final  . GFR calc Af Amer 07/09/2015  >60  >60 mL/min Final   Comment: (NOTE) The eGFR has been calculated using the CKD EPI equation. This calculation has not been validated in all clinical situations. eGFR's persistently <60 mL/min signify possible Chronic Kidney Disease.   . Anion gap 07/09/2015 7  5 - 15 Final  . Magnesium 07/09/2015 2.0  1.7 - 2.4 mg/dL Final  . Glucose-Capillary 07/08/2015 134* 65 - 99 mg/dL Final  . Comment 1 07/08/2015 Notify RN   Final  . Glucose-Capillary 07/09/2015 68  65 - 99 mg/dL Final  . Comment 1 07/09/2015 Notify RN   Final  . Comment 2 07/09/2015 Document in Chart   Final  . Glucose-Capillary 07/09/2015 121* 65 - 99 mg/dL Final  . Glucose-Capillary 07/09/2015 163* 65 - 99 mg/dL Final  Admission on 06/01/2015, Discharged on 06/01/2015  Component Date Value Ref Range Status  . WBC 06/01/2015 12.7* 4.0 - 10.5 K/uL Final  . RBC 06/01/2015 3.06* 3.87 - 5.11 MIL/uL  Final  . Hemoglobin 06/01/2015 8.8* 12.0 - 15.0 g/dL Final  . HCT 06/01/2015 26.7* 36.0 - 46.0 % Final  . MCV 06/01/2015 87.3  78.0 - 100.0 fL Final  . MCH 06/01/2015 28.8  26.0 - 34.0 pg Final  . MCHC 06/01/2015 33.0  30.0 - 36.0 g/dL Final  . RDW 06/01/2015 18.4* 11.5 - 15.5 % Final  . Platelets 06/01/2015 393  150 - 400 K/uL Final  . Neutrophils Relative % 06/01/2015 58   Final  . Neutro Abs 06/01/2015 7.4  1.7 - 7.7 K/uL Final  . Lymphocytes Relative 06/01/2015 29   Final  . Lymphs Abs 06/01/2015 3.7  0.7 - 4.0 K/uL Final  . Monocytes Relative 06/01/2015 12   Final  . Monocytes Absolute 06/01/2015 1.5* 0.1 - 1.0 K/uL Final  . Eosinophils Relative 06/01/2015 1   Final  . Eosinophils Absolute 06/01/2015 0.1  0.0 - 0.7 K/uL Final  . Basophils Relative 06/01/2015 0   Final  . Basophils Absolute 06/01/2015 0.0  0.0 - 0.1 K/uL Final  . Sodium 06/01/2015 146* 135 - 145 mmol/L Final  . Potassium 06/01/2015 3.8  3.5 - 5.1 mmol/L Final  . Chloride 06/01/2015 116* 101 - 111 mmol/L Final  . CO2 06/01/2015 25  22 - 32 mmol/L Final    . Glucose, Bld 06/01/2015 82  65 - 99 mg/dL Final  . BUN 06/01/2015 15  6 - 20 mg/dL Final  . Creatinine, Ser 06/01/2015 0.53  0.44 - 1.00 mg/dL Final  . Calcium 06/01/2015 10.0  8.9 - 10.3 mg/dL Final  . Total Protein 06/01/2015 7.0  6.5 - 8.1 g/dL Final  . Albumin 06/01/2015 2.9* 3.5 - 5.0 g/dL Final  . AST 06/01/2015 12* 15 - 41 U/L Final  . ALT 06/01/2015 11* 14 - 54 U/L Final  . Alkaline Phosphatase 06/01/2015 47  38 - 126 U/L Final  . Total Bilirubin 06/01/2015 0.8  0.3 - 1.2 mg/dL Final  . GFR calc non Af Amer 06/01/2015 >60  >60 mL/min Final  . GFR calc Af Amer 06/01/2015 >60  >60 mL/min Final   Comment: (NOTE) The eGFR has been calculated using the CKD EPI equation. This calculation has not been validated in all clinical situations. eGFR's persistently <60 mL/min signify possible Chronic Kidney Disease.   . Anion gap 06/01/2015 5  5 - 15 Final  . Prothrombin Time 06/01/2015 14.4  11.6 - 15.2 seconds Final  . INR 06/01/2015 1.10  0.00 - 1.49 Final    Dg Chest 1 View  07/20/2015  CLINICAL DATA:  Altered mental status. Currently uncooperative and combative. Being treated for multiple decubitus ulcers and has a Foley catheter. History of hypertension and diabetes. EXAM: CHEST 1 VIEW COMPARISON:  Chest x-rays dated 12/14/2014 and 06/25/2012. FINDINGS: Study is hypoinspiratory causing crowding of the bronchovascular and interstitial markings. Given the slightly low lung volumes, lungs are clear. No confluent opacity to suggest a developing pneumonia. No pleural effusions seen. No pneumothorax. Heart size is normal. Overall cardiomediastinal silhouette is stable in size and configuration. Osseous and soft tissue structures about the chest are unremarkable. IMPRESSION: Slightly hypoinspiratory exam.  No active disease. Electronically Signed   By: Franki Cabot M.D.   On: 07/20/2015 16:41   Ct Head Wo Contrast  07/20/2015  CLINICAL DATA:  Altered mental status. EXAM: CT HEAD WITHOUT  CONTRAST TECHNIQUE: Contiguous axial images were obtained from the base of the skull through the vertex without intravenous contrast. COMPARISON:  01/18/2010 FINDINGS: Sinuses/Soft  tissues: Mild motion degradation throughout. Clear paranasal sinuses and mastoid air cells. Intracranial: Age advanced cerebral atrophy. Mild low density in the periventricular white matter likely related to small vessel disease. No mass lesion, hemorrhage, hydrocephalus, acute infarct, intra-axial, or extra-axial fluid collection. IMPRESSION: 1.  No acute intracranial abnormality. 2. Mild motion degradation. 3.  Cerebral atrophy and small vessel ischemic change. Electronically Signed   By: Abigail Miyamoto M.D.   On: 07/20/2015 16:57   Ct Pelvis W Contrast  07/05/2015  CLINICAL DATA:  New decubitus wound, current history of multiple sclerosis, diabetes, immobility EXAM: CT PELVIS WITH CONTRAST TECHNIQUE: Multidetector CT imaging of the pelvis was performed using the standard protocol following the bolus administration of intravenous contrast. CONTRAST:  118m OMNIPAQUE IOHEXOL 300 MG/ML  SOLN COMPARISON:  12/15/14 FINDINGS: Midline dorsal soft tissue wound extending from tip of coccyx to the dermis. Another soft tissue wound is seen on the left overlying the ischial tuberosity extending to the dermis. In this latter wound there may be a 12 mm rim enhancing fluid collection, suggesting a tiny abscess. No underlying osseous abnormalities suggesting osteomyelitis. No acute abnormalities within the pelvis. IMPRESSION: Evidence of 2 decubitus ulcers as described above with no findings to suggest osteomyelitis. Electronically Signed   By: RSkipper ClicheM.D.   On: 07/05/2015 21:25   Mr Brain Wo Contrast  07/21/2015  CLINICAL DATA:  Altered mental status. Worsening confusion over the past 24 hours. History of multiple sclerosis. EXAM: MRI HEAD WITHOUT CONTRAST TECHNIQUE: Multiplanar, multiecho pulse sequences of the brain and surrounding  structures were obtained without intravenous contrast. COMPARISON:  Head CT 07/20/2015 and MRI 10/05/2012 FINDINGS: The study is mildly motion degraded. There is no evidence of acute infarct, intracranial hemorrhage, mass, midline shift, or extra-axial fluid collection. There is a moderately advanced cerebral atrophy for age, similar to the prior MRI. Rather extensive T2 hyperintensities are present in the cerebral white matter bilaterally, not significantly changed from the prior MRI and with greatest involvement of the periventricular regions. Many lesions are in a perivenular distribution. There is involvement of the corpus callosum, which is atrophic. There is also prominent cerebellar and brainstem involvement. No restricted diffusion is identified within any of the lesions. Orbits are unremarkable on this nondedicated study. Paranasal sinuses and mastoid air cells are clear. Major intracranial vascular flow voids are preserved. IMPRESSION: 1. No acute intracranial abnormality. 2. Unchanged, extensive supratentorial and infratentorial white matter disease consistent with multiple sclerosis. 3. Advanced cerebral atrophy. Electronically Signed   By: ALogan BoresM.D.   On: 07/21/2015 20:58   [place]  07/21/2015  CLINICAL DATA:  Acute renal failure. EXAM: RENAL / URINARY TRACT ULTRASOUND COMPLETE COMPARISON:  CT abdomen dated 12/15/2014. FINDINGS: Right Kidney: Length: 14.5 cm. Mild pelviectasis without frank hydronephrosis. Proximal right ureter appears distended which may be chronic. Echogenicity and thickness of the right renal cortex is within normal limits without mass or cyst. Left Kidney: Length: 14.5 cm. Also pelviectasis without frank hydronephrosis. Left renal cyst measuring 3.8 x 3 cm. Echogenicity and thickness of the left renal cortex otherwise grossly normal. Large stone within the left renal pelvis measuring approximate 2.6 cm greatest dimension. Bladder: Decompressed by Foley catheter.  Incidental note made of a small stone within the gallbladder. IMPRESSION: 1. Mild bilateral pelviectasis without frank hydronephrosis. 2. Distention of the proximal right ureter which may be chronic. 3. Left renal stone measuring approximately 2.6 cm, also seen on previous CT. 4. Cholelithiasis. Electronically Signed   By: SCherlynn Kaiser  Enriqueta Shutter M.D.   On: 07/21/2015 12:47     Assessment/Plan   ICD-9-CM ICD-10-CM   1. Aspiration pneumonia, unspecified aspiration pneumonia type, unspecified laterality, unspecified part of lung ([place]) 507.0 J69.0   2. Decubitus ulcer, stage 3 (H[facility]) 707.00 L89.93    707.23    3. Electrolyte imbalance 276.9 E87.8   4. Deep vein thrombosis (DVT) of left lower extremity, unspecified chronicity, unspecified vein (HCC) 453.40 I82.402   5. Chronic anemia 285.9 D64.9   6. Controlled type 2 diabetes mellitus with stage 1 chronic kidney disease, without long-term current use of insulin (HCC) 250.40 E11.22    585.1 N18.1   7. Moderate malnutrition ([place]) 263.0 E44.0   8. MS (multiple sclerosis) ([place]) 340 G35    dx in 2002 but no longer on therapy  9.       Weight loss   Check BMP this week  Finish Augmentin (needs 7 days)  Dietary eval for weight loss and possible nutritional supplements  Cont lovenox injections until Cr 1 then switch back to xeralto  Cont current meds as ordered  Wound care to follow as ordered  PT/OT/ST as ordered  T/c palliative care if she does not continue to improve  Incentive spirometry at bedside at least 10x per day  GOAL: short term rehab and d/c home when medically appropriate. Communicated with pt and nursing.  Will follow  Alica Shellhammer S. Perlie Gold  [facility] [address] [place], [place] [address] [phone] Cell (Monday-Friday 8 AM - 5 PM) [phone] After 5 PM and follow prompts

## 2015-08-01 LAB — BASIC METABOLIC PANEL
BUN: 24 mg/dL — AB (ref 4–21)
CREATININE: 0.5 mg/dL (ref 0.5–1.1)
Glucose: 103 mg/dL
Potassium: 4 mmol/L (ref 3.4–5.3)
SODIUM: 138 mmol/L (ref 137–147)

## 2015-08-12 ENCOUNTER — Encounter: Payer: Self-pay | Admitting: Nurse Practitioner

## 2015-08-12 ENCOUNTER — Non-Acute Institutional Stay (SKILLED_NURSING_FACILITY): Payer: Medicare Other | Admitting: Nurse Practitioner

## 2015-08-12 DIAGNOSIS — N181 Chronic kidney disease, stage 1: Secondary | ICD-10-CM | POA: Diagnosis not present

## 2015-08-12 DIAGNOSIS — G35 Multiple sclerosis: Secondary | ICD-10-CM | POA: Diagnosis not present

## 2015-08-12 DIAGNOSIS — E440 Moderate protein-calorie malnutrition: Secondary | ICD-10-CM

## 2015-08-12 DIAGNOSIS — J690 Pneumonitis due to inhalation of food and vomit: Secondary | ICD-10-CM | POA: Diagnosis not present

## 2015-08-12 DIAGNOSIS — I82402 Acute embolism and thrombosis of unspecified deep veins of left lower extremity: Secondary | ICD-10-CM | POA: Diagnosis not present

## 2015-08-12 DIAGNOSIS — D649 Anemia, unspecified: Secondary | ICD-10-CM

## 2015-08-12 DIAGNOSIS — L8993 Pressure ulcer of unspecified site, stage 3: Secondary | ICD-10-CM

## 2015-08-12 DIAGNOSIS — E878 Other disorders of electrolyte and fluid balance, not elsewhere classified: Secondary | ICD-10-CM | POA: Diagnosis not present

## 2015-08-12 DIAGNOSIS — E1122 Type 2 diabetes mellitus with diabetic chronic kidney disease: Secondary | ICD-10-CM

## 2015-08-12 NOTE — Progress Notes (Signed)
Patient ID: [name], female   DOB: 02-18-1961, 55 y.o.   MRN: [id number]    Nursing Home Location:  [facility]   Place of Service: SNF (25)  PCP: [name], MD  No Known Allergies  Chief Complaint  Patient presents with  . Discharge Note    Discharge from facility    HPI:  Patient is a 55 y.o. female seen today at [facility] for discharge home. Pt with hx of MS, DM, hyperlipidemia, anemia, edema, GERD. Pt at [facility] after hospitalization for acute encephalopathy and acute renal failure.Pt was treated for pneumonia with Augmentin.  Also noted stage 4 decub ulcer followed by wound care which has improved. Pt currently doing better. Patient currently doing well with therapy, now stable to discharge home with home health.  Review of Systems:  Review of Systems  Constitutional: Negative for activity change, appetite change, fatigue and unexpected weight change.  HENT: Negative for congestion and hearing loss.   Eyes: Negative.   Respiratory: Negative for cough and shortness of breath.   Cardiovascular: Negative for chest pain, palpitations and leg swelling.  Gastrointestinal: Negative for abdominal pain, diarrhea and constipation.  Genitourinary: Negative for dysuria and difficulty urinating.  Musculoskeletal: Positive for arthralgias. Negative for myalgias.  Skin: Positive for wound. Negative for color change.  Neurological: Positive for weakness. Negative for dizziness.  Psychiatric/Behavioral: Negative for behavioral problems, confusion and agitation.  All other systems reviewed and are negative.   History reviewed. No pertinent past medical history. Past Surgical History  Procedure Laterality Date  . Hernia mesh removal    . Tubes tided    . Tubal ligation    . Esophagogastroduodenoscopy Left 01/02/2015    Procedure: ESOPHAGOGASTRODUODENOSCOPY (EGD);  Surgeon: Arta Silence, MD;  Location: [facility];  Service: Endoscopy;   Laterality: Left;   Social History:   reports that she quit smoking about 8 months ago. Her smoking use included Cigarettes. She has a 20 pack-year smoking history. She has never used smokeless tobacco. She reports that she drinks about 3.0 oz of alcohol per week. She reports that she does not use illicit drugs.  Family History  Problem Relation Age of Onset  . Diabetes Mother   . Diabetes Father   . Diabetes Sister   . Scoliosis Sister   . Alzheimer's disease Mother     Medications: Patient's Medications  New Prescriptions   No medications on file  Previous Medications   ALBUTEROL (PROVENTIL HFA;VENTOLIN HFA) 108 (90 BASE) MCG/ACT INHALER    Inhale 2 puffs into the lungs every 4 (four) hours as needed for wheezing or shortness of breath.   AMBULATORY NON FORMULARY MEDICATION    Med Pass Offer 120 ml four times daily for weight loss.   AMINO ACIDS-PROTEIN HYDROLYS (FEEDING SUPPLEMENT, PRO-STAT SUGAR FREE 64,) LIQD    Take 30 mLs by mouth 2 (two) times daily.   BACLOFEN (LIORESAL) 10 MG TABLET    Take 10 mg by mouth 3 (three) times daily.    BISACODYL (DULCOLAX) 10 MG SUPPOSITORY    Place 10 mg rectally daily as needed for moderate constipation (only give 1 dose in 24 hours).   CIPROFLOXACIN (CIPRO) 500 MG TABLET    Take 500 mg by mouth 2 (two) times daily.   FAMOTIDINE (PEPCID) 20 MG TABLET    Take 1 tablet (20 mg total) by mouth 2 (two) times daily.   FUROSEMIDE (LASIX) 40 MG TABLET    Take 1 tablet (40  mg total) by mouth daily as needed for fluid or edema. Reported on 07/05/2015   GABAPENTIN (NEURONTIN) 100 MG CAPSULE    Take 100 mg by mouth at bedtime.   HYDROCODONE-ACETAMINOPHEN (NORCO) 5-325 MG TABLET    Take 1 tablet by mouth every 6 (six) hours as needed for moderate pain.   LOVASTATIN (MEVACOR) 20 MG TABLET    Take 20 mg by mouth daily.   METFORMIN (GLUCOPHAGE) 500 MG TABLET    Take 500 mg by mouth 2 (two) times daily.   MULTIPLE VITAMIN (MULTIVITAMIN WITH MINERALS) TABS TABLET     Take 1 tablet by mouth daily.   OMEGA 3 1000 MG CAPS    Take 1,000 mg by mouth daily.   RIVAROXABAN (XARELTO) 20 MG TABS TABLET    Take 20 mg by mouth daily with supper. Reported on 08/12/2015   SODIUM PHOSPHATES (RA SALINE ENEMA RE)    Place 1 application rectally daily as needed (severe constipation, do not exceed 1 dose in 24 hours, if still no relief contact MD).   SOLIFENACIN (VESICARE) 5 MG TABLET    Take 5 mg by mouth daily.   VITAMIN C (ASCORBIC ACID) 500 MG TABLET    Take 500 mg by mouth daily.  Modified Medications   No medications on file  Discontinued Medications   No medications on file     Physical Exam: Filed Vitals:   08/12/15 1024  BP: 128/66  Pulse: 98  Temp: 97 F (36.1 C)  TempSrc: Oral  Resp: 20  Height: 5\' 6"  (1.676 m)  Weight: 129 lb (58.514 kg)    Physical Exam  Constitutional: She appears well-developed.  Lying in bed in NAD. Frail appearing  HENT:  Mouth/Throat: Oropharynx is clear and moist. No oropharyngeal exudate.  Poor dentition  Eyes: Pupils are equal, round, and reactive to light. No scleral icterus.  Neck: Neck supple. Carotid bruit is not present. No tracheal deviation present. No thyromegaly present.  Cardiovascular: Normal rate and regular rhythm.   Murmur (1/6 SEM) heard. Pulmonary/Chest: Effort normal and breath sounds normal. No stridor.  Abdominal: Soft. Bowel sounds are normal. There is no hepatomegaly.  Genitourinary:  Foley draining clear yellow urine  Musculoskeletal: She exhibits no edema.  Lymphadenopathy:    She has no cervical adenopathy.  Neurological: She is alert.  B/l LE paraplegic  Skin: Skin is warm and dry.  Sacral wound dsg c/d/i  Psychiatric: She has a normal mood and affect.    Labs reviewed: Basic Metabolic Panel:  Recent Labs  12/14/14 0535  12/16/14 0436  12/21/14 0425  07/09/15 0415  07/20/15 2320 07/21/15 0310 07/22/15 1156 08/01/15  NA 144  < > 136  < > 139  < > 141  < > 142 143 146* 138    K 4.1  < > 3.4*  < > 3.7  < > 3.9  < > 4.7 4.5 4.1 4.0  CL 119*  < > 112*  < > 107  < > 112*  < > 112* 113* 115*  --   CO2 17*  < > 17*  < > 27  < > 22  < > 21* 21* 24  --   GLUCOSE 155*  < > 126*  < > 87  < > 67  < > 122* 104* 246*  --   BUN 79*  < > 13  < > 7  < > 11  < > 79* 72* 41* 24*  CREATININE 2.71*  < >  0.86  < > 0.61  < > 0.67  < > 3.66* 3.24* 1.27* 0.5  CALCIUM 8.9  < > 7.0*  < > 8.7*  < > 9.7  < > 9.2 9.2 9.7  --   MG 1.7  --  1.0*  < > 1.8  --  2.0  --   --  2.3  --   --   PHOS 3.7  --  2.1*  --   --   --   --   --   --  5.0*  --   --   < > = values in this interval not displayed. Liver Function Tests:  Recent Labs  07/05/15 1750 07/13/15 07/20/15 1712 07/21/15 0310  AST 12* 12* 13* 11*  ALT 10* 12 14 10*  ALKPHOS 52 46 47 40  BILITOT 0.5  --  0.5 0.3  PROT 7.5  --  8.1 6.6  ALBUMIN 2.9*  --  3.0* 2.5*   No results for input(s): LIPASE, AMYLASE in the last 8760 hours.  Recent Labs  07/20/15 1712  AMMONIA 21   CBC:  Recent Labs  06/01/15 1930 07/05/15 1750  07/20/15 1712 07/21/15 0310 07/22/15 1156  WBC 12.7* 11.4*  < > 15.9* 11.5* 12.1*  NEUTROABS 7.4 6.6  --  11.8*  --   --   HGB 8.8* 9.1*  < > 9.7* 8.1* 8.5*  HCT 26.7* 29.1*  < > 29.8* 25.7* 26.9*  MCV 87.3 88.7  < > 85.6 88.3 88.5  PLT 393 481*  < > 666* 646* 651*  < > = values in this interval not displayed. TSH:  Recent Labs  07/21/15 0310  TSH 1.787   A1C: Lab Results  Component Value Date   HGBA1C 5.8* 07/20/2015   Lipid Panel:  Recent Labs  07/13/15  CHOL 143  HDL 41  LDLCALC 88  TRIG 71    Radiological Exams: Dg Chest 1 View  07/20/2015  CLINICAL DATA:  Altered mental status. Currently uncooperative and combative. Being treated for multiple decubitus ulcers and has a Foley catheter. History of hypertension and diabetes. EXAM: CHEST 1 VIEW COMPARISON:  Chest x-rays dated 12/14/2014 and 06/25/2012. FINDINGS: Study is hypoinspiratory causing crowding of the bronchovascular and  interstitial markings. Given the slightly low lung volumes, lungs are clear. No confluent opacity to suggest a developing pneumonia. No pleural effusions seen. No pneumothorax. Heart size is normal. Overall cardiomediastinal silhouette is stable in size and configuration. Osseous and soft tissue structures about the chest are unremarkable. IMPRESSION: Slightly hypoinspiratory exam.  No active disease. Electronically Signed   By: Franki Cabot M.D.   On: 07/20/2015 16:41   Ct Head Wo Contrast  07/20/2015  CLINICAL DATA:  Altered mental status. EXAM: CT HEAD WITHOUT CONTRAST TECHNIQUE: Contiguous axial images were obtained from the base of the skull through the vertex without intravenous contrast. COMPARISON:  01/18/2010 FINDINGS: Sinuses/Soft tissues: Mild motion degradation throughout. Clear paranasal sinuses and mastoid air cells. Intracranial: Age advanced cerebral atrophy. Mild low density in the periventricular white matter likely related to small vessel disease. No mass lesion, hemorrhage, hydrocephalus, acute infarct, intra-axial, or extra-axial fluid collection. IMPRESSION: 1.  No acute intracranial abnormality. 2. Mild motion degradation. 3.  Cerebral atrophy and small vessel ischemic change. Electronically Signed   By: Abigail Miyamoto M.D.   On: 07/20/2015 16:57   Mr Brain Wo Contrast  07/21/2015  CLINICAL DATA:  Altered mental status. Worsening confusion over the past 24 hours. History of  multiple sclerosis. EXAM: MRI HEAD WITHOUT CONTRAST TECHNIQUE: Multiplanar, multiecho pulse sequences of the brain and surrounding structures were obtained without intravenous contrast. COMPARISON:  Head CT 07/20/2015 and MRI 10/05/2012 FINDINGS: The study is mildly motion degraded. There is no evidence of acute infarct, intracranial hemorrhage, mass, midline shift, or extra-axial fluid collection. There is a moderately advanced cerebral atrophy for age, similar to the prior MRI. Rather extensive T2 hyperintensities are  present in the cerebral white matter bilaterally, not significantly changed from the prior MRI and with greatest involvement of the periventricular regions. Many lesions are in a perivenular distribution. There is involvement of the corpus callosum, which is atrophic. There is also prominent cerebellar and brainstem involvement. No restricted diffusion is identified within any of the lesions. Orbits are unremarkable on this nondedicated study. Paranasal sinuses and mastoid air cells are clear. Major intracranial vascular flow voids are preserved. IMPRESSION: 1. No acute intracranial abnormality. 2. Unchanged, extensive supratentorial and infratentorial white matter disease consistent with multiple sclerosis. 3. Advanced cerebral atrophy. Electronically Signed   By: Logan Bores M.D.   On: 07/21/2015 20:58   US Renal  07/21/2015  CLINICAL DATA:  Acute renal failure. EXAM: RENAL / URINARY TRACT ULTRASOUND COMPLETE COMPARISON:  CT abdomen dated 12/15/2014. FINDINGS: Right Kidney: Length: 14.5 cm. Mild pelviectasis without frank hydronephrosis. Proximal right ureter appears distended which may be chronic. Echogenicity and thickness of the right renal cortex is within normal limits without mass or cyst. Left Kidney: Length: 14.5 cm. Also pelviectasis without frank hydronephrosis. Left renal cyst measuring 3.8 x 3 cm. Echogenicity and thickness of the left renal cortex otherwise grossly normal. Large stone within the left renal pelvis measuring approximate 2.6 cm greatest dimension. Bladder: Decompressed by Foley catheter. Incidental note made of a small stone within the gallbladder. IMPRESSION: 1. Mild bilateral pelviectasis without frank hydronephrosis. 2. Distention of the proximal right ureter which may be chronic. 3. Left renal stone measuring approximately 2.6 cm, also seen on previous CT. 4. Cholelithiasis. Electronically Signed   By: Franki Cabot M.D.   On: 07/21/2015 12:47    Assessment/Plan 1. Aspiration  pneumonia, unspecified aspiration pneumonia type, unspecified laterality, unspecified part of lung ([place]) Without signs of recurrence. conts on dysphagia diet. Pt has completed augmentin.   2. Decubitus ulcer, stage 3 (HCC) -improving, pt currently receiving cipro from what appears to be previous hospitalization due to worsening ulcer. However no signs of infection and pt has been on cipro since admission. Will DC at this time. Will have home health nursing follow wound.   3. Electrolyte imbalance Improved on recent labs.   4. Deep vein thrombosis (DVT) of left lower extremity, unspecified chronicity, unspecified vein (HCC) Pt previously on xarelto 20 mg daily but due to AKI in hospital was changed to lovenox. This has resolved at this time time so will dc lovenox at this time and start xarelto 20 mg   5. Chronic anemia hgb stable from hospital but will need further outpatient follow up.   6. Controlled type 2 diabetes mellitus with stage 1 chronic kidney disease, without long-term current use of insulin (HCC) A1c at goal, no signs of hypoglycemia noted. conts on meformin twice daily  7. Moderate malnutrition ([place]) Appetite has improved per patient. Cont supplements. Will need ongoing outpatient follow up  8. MS (multiple sclerosis) ([place]) Stable, has help at home. conts on Neurontin, baclofen. Pt with neurogenic bladder and has chronic foley.   pt is stable for discharge-will need PT/OT/Nursing/HHA/SW per home  health. DME needed and includes hospital bed and WC. Rx written.  will need to follow up with PCP within 2 weeks.      Carlos American. Harle Battiest  [facility] [phone] 8 am - 5 pm) [phone] (after hours)

## 2015-08-28 ENCOUNTER — Non-Acute Institutional Stay (SKILLED_NURSING_FACILITY): Payer: Medicare Other | Admitting: Adult Health

## 2015-08-28 ENCOUNTER — Encounter: Payer: Self-pay | Admitting: Adult Health

## 2015-08-28 DIAGNOSIS — E1169 Type 2 diabetes mellitus with other specified complication: Secondary | ICD-10-CM | POA: Diagnosis not present

## 2015-08-28 DIAGNOSIS — E1122 Type 2 diabetes mellitus with diabetic chronic kidney disease: Secondary | ICD-10-CM

## 2015-08-28 DIAGNOSIS — I82402 Acute embolism and thrombosis of unspecified deep veins of left lower extremity: Secondary | ICD-10-CM | POA: Diagnosis not present

## 2015-08-28 DIAGNOSIS — K219 Gastro-esophageal reflux disease without esophagitis: Secondary | ICD-10-CM | POA: Diagnosis not present

## 2015-08-28 DIAGNOSIS — N183 Chronic kidney disease, stage 3 unspecified: Secondary | ICD-10-CM

## 2015-08-28 DIAGNOSIS — E785 Hyperlipidemia, unspecified: Secondary | ICD-10-CM

## 2015-08-28 DIAGNOSIS — G35 Multiple sclerosis: Secondary | ICD-10-CM

## 2015-08-28 NOTE — Progress Notes (Signed)
Patient ID: [name], female   DOB: Jan 11, 1961, 55 y.o.   MRN: [id number]   Facility: [name]       No Known Allergies  Chief Complaint  Patient presents with  . Medical Management of Chronic Issues    Follow up    HPI:  She is a long term resident of this nursing facility being seen for the management of her chronic illnesses. Overall her status remains without change in her status.  She is not voicing any concerns or complaints. There are no nursing concerns at this time.    Past Medical History  Diagnosis Date  . Diabetes mellitus without complication ([place])   . Multiple sclerosis diagnosed 2002-not on Therapy any longer 06/26/2012  . Stage 4 decubitus ulcer ([place]) 07/05/2015  . Acute renal failure superimposed on stage 3 chronic kidney disease ([place]) 07/21/2015  . Malnutrition of moderate degree 07/21/2015     Past Surgical History  Procedure Laterality Date  . Hernia mesh removal    . Tubes tided    . Tubal ligation    . Esophagogastroduodenoscopy Left 01/02/2015    Procedure: ESOPHAGOGASTRODUODENOSCOPY (EGD);  Surgeon: Arta Silence, MD;  Location: [facility];  Service: Endoscopy;  Laterality: Left;    VITAL SIGNS BP 104/77 mmHg  Pulse 99  Temp(Src) 98.8 F (37.1 C) (Oral)  Resp 18  Ht 5\' 5"  (1.651 m)  Wt 127 lb (57.607 kg)  BMI 21.13 kg/m2  Patient's Medications  New Prescriptions   No medications on file  Previous Medications   ACETAMINOPHEN (APAP) 325 MG TABLET    Take 650 mg by mouth every 6 (six) hours as needed for pain.   ALBUTEROL (PROVENTIL HFA;VENTOLIN HFA) 108 (90 BASE) MCG/ACT INHALER    Inhale 2 puffs into the lungs every 4 (four) hours as needed for wheezing or shortness of breath.   AMBULATORY NON FORMULARY MEDICATION    Med Pass Offer 120 ml four times daily for weight loss.   AMINO ACIDS-PROTEIN HYDROLYS (FEEDING SUPPLEMENT, PRO-STAT SUGAR FREE 64,) LIQD    Take 30 mLs by mouth 2 (two) times daily.   BACLOFEN (LIORESAL) 10 MG TABLET     Take 10 mg by mouth 3 (three) times daily.    BISACODYL (DULCOLAX) 10 MG SUPPOSITORY    Place 10 mg rectally daily as needed for moderate constipation (only give 1 dose in 24 hours).   FAMOTIDINE (PEPCID) 20 MG TABLET    Take 1 tablet (20 mg total) by mouth 2 (two) times daily.   FUROSEMIDE (LASIX) 40 MG TABLET    Take 1 tablet (40 mg total) by mouth daily as needed for fluid or edema. Reported on 07/05/2015   GABAPENTIN (NEURONTIN) 100 MG CAPSULE    Take 100 mg by mouth at bedtime.   HYDROCODONE-ACETAMINOPHEN (NORCO) 5-325 MG TABLET    Take 1 tablet by mouth every 6 (six) hours as needed for moderate pain.   LOVASTATIN (MEVACOR) 20 MG TABLET    Take 20 mg by mouth daily.   MAGNESIUM HYDROXIDE (MILK OF MAGNESIA PO)    Take 30 mLs by mouth daily as needed.   METFORMIN (GLUCOPHAGE) 500 MG TABLET    Take 500 mg by mouth 2 (two) times daily.   MULTIPLE VITAMIN (MULTIVITAMIN WITH MINERALS) TABS TABLET    Take 1 tablet by mouth daily.   OMEGA 3 1000 MG CAPS    Take 1,000 mg by mouth daily.   RIVAROXABAN (XARELTO) 20 MG TABS TABLET  Take 20 mg by mouth daily with supper. Reported on 08/12/2015   SODIUM PHOSPHATES (RA SALINE ENEMA RE)    Place 1 application rectally daily as needed (severe constipation, do not exceed 1 dose in 24 hours, if still no relief contact MD).   SOLIFENACIN (VESICARE) 5 MG TABLET    Take 5 mg by mouth daily.   VITAMIN C (ASCORBIC ACID) 500 MG TABLET    Take 500 mg by mouth daily.  Modified Medications   No medications on file  Discontinued Medications   No medications on file     SIGNIFICANT DIAGNOSTIC EXAMS    LABS REVIEWED:   07-15-15: wbc 12.9; hgb 8.9; hct 27.8; mcv 86.3; plt 541;  07-31-15: glucose 103; bun 24; creat 0.54; k+ 4.0; na++138     Review of Systems  Constitutional: Negative for malaise/fatigue.  Respiratory: Negative for cough and shortness of breath.   Cardiovascular: Negative for chest pain and palpitations.  Gastrointestinal: Negative for  heartburn, abdominal pain and constipation.  Musculoskeletal: Negative for myalgias, back pain and joint pain.  Skin: Negative.   Psychiatric/Behavioral: The patient is not nervous/anxious.     Physical Exam  Constitutional: No distress.  Frail   Eyes: Conjunctivae are normal.  Neck: Neck supple. No JVD present. No thyromegaly present.  Cardiovascular: Normal rate, regular rhythm and intact distal pulses.   Murmur heard. Respiratory: Effort normal and breath sounds normal. No respiratory distress. She has no wheezes.  GI: Soft. Bowel sounds are normal. She exhibits no distension. There is no tenderness.  Genitourinary:  Has foley   Musculoskeletal: She exhibits no edema.  Has paraplegia   Lymphadenopathy:    She has no cervical adenopathy.  Neurological: She is alert.  Skin: Skin is warm and dry. She is not diaphoretic.  Psychiatric: She has a normal mood and affect.        ASSESSMENT/ PLAN:  1. Gerd: will continue pepcid 20 mg twice daily   2. DVT lower extremity: left leg 12/2014: will continue xarelto 20 mg daily   3.  Dyslipidemia: will continue mevacor 20 mg daily fish oil 1 gm daily   4. Diabetes: will continue metformin 500 mg twice daily   5. Urine retention: has foley does take vesicare 5 mg daily for bladder spasms   6. MS: will continue baclofen 10 mg three times daily; neurontin 100 mg nightly and has vicodin 5/325 mg every 6 hours as needed for pain management     Time spent with patient 45   minutes >50% time spent counseling; reviewing medical record; tests; labs; and developing future plan of care    Ok Edwards NP [facility]  Contact [phone] Monday through Friday 8am- 5pm  After hours call [phone]

## 2015-08-30 ENCOUNTER — Inpatient Hospital Stay (HOSPITAL_COMMUNITY)
Admission: EM | Admit: 2015-08-30 | Discharge: 2015-09-01 | DRG: 871 | Disposition: A | Discharge status: Skilled Nursing Facility | Payer: Medicare Other | Attending: Internal Medicine | Admitting: Internal Medicine

## 2015-08-30 ENCOUNTER — Encounter (HOSPITAL_COMMUNITY): Payer: Self-pay

## 2015-08-30 DIAGNOSIS — E860 Dehydration: Secondary | ICD-10-CM | POA: Diagnosis present

## 2015-08-30 DIAGNOSIS — L89153 Pressure ulcer of sacral region, stage 3: Secondary | ICD-10-CM | POA: Diagnosis present

## 2015-08-30 DIAGNOSIS — N390 Urinary tract infection, site not specified: Secondary | ICD-10-CM | POA: Diagnosis present

## 2015-08-30 DIAGNOSIS — N319 Neuromuscular dysfunction of bladder, unspecified: Secondary | ICD-10-CM | POA: Diagnosis present

## 2015-08-30 DIAGNOSIS — A419 Sepsis, unspecified organism: Secondary | ICD-10-CM | POA: Diagnosis not present

## 2015-08-30 DIAGNOSIS — R410 Disorientation, unspecified: Secondary | ICD-10-CM | POA: Diagnosis not present

## 2015-08-30 DIAGNOSIS — I82409 Acute embolism and thrombosis of unspecified deep veins of unspecified lower extremity: Secondary | ICD-10-CM | POA: Diagnosis present

## 2015-08-30 DIAGNOSIS — E119 Type 2 diabetes mellitus without complications: Secondary | ICD-10-CM

## 2015-08-30 DIAGNOSIS — Z7901 Long term (current) use of anticoagulants: Secondary | ICD-10-CM

## 2015-08-30 DIAGNOSIS — D638 Anemia in other chronic diseases classified elsewhere: Secondary | ICD-10-CM | POA: Diagnosis present

## 2015-08-30 DIAGNOSIS — Z87891 Personal history of nicotine dependence: Secondary | ICD-10-CM

## 2015-08-30 DIAGNOSIS — D649 Anemia, unspecified: Secondary | ICD-10-CM | POA: Diagnosis present

## 2015-08-30 DIAGNOSIS — Z820 Family history of epilepsy and other diseases of the nervous system: Secondary | ICD-10-CM

## 2015-08-30 DIAGNOSIS — G9340 Encephalopathy, unspecified: Secondary | ICD-10-CM

## 2015-08-30 DIAGNOSIS — Z7984 Long term (current) use of oral hypoglycemic drugs: Secondary | ICD-10-CM

## 2015-08-30 DIAGNOSIS — E1122 Type 2 diabetes mellitus with diabetic chronic kidney disease: Secondary | ICD-10-CM | POA: Diagnosis present

## 2015-08-30 DIAGNOSIS — N183 Chronic kidney disease, stage 3 (moderate): Secondary | ICD-10-CM | POA: Diagnosis present

## 2015-08-30 DIAGNOSIS — Z86718 Personal history of other venous thrombosis and embolism: Secondary | ICD-10-CM

## 2015-08-30 DIAGNOSIS — Z833 Family history of diabetes mellitus: Secondary | ICD-10-CM

## 2015-08-30 DIAGNOSIS — E870 Hyperosmolality and hypernatremia: Secondary | ICD-10-CM | POA: Diagnosis present

## 2015-08-30 DIAGNOSIS — G35 Multiple sclerosis: Secondary | ICD-10-CM | POA: Diagnosis present

## 2015-08-30 HISTORY — DX: Urinary tract infection, site not specified: N39.0

## 2015-08-30 NOTE — ED Notes (Signed)
Per ems, pt from [facility] for altered mental status all day today and fever. Per facility pt normally independent and alert and oriented x 4. Pt confused today and foul smell to urine. Pt has foley catheter in placed chronically. CBG 209, BP 122/74, 96 HR, 100% on RA. Pt had blood drew today and WBC 25,000

## 2015-08-31 ENCOUNTER — Encounter (HOSPITAL_COMMUNITY): Payer: Self-pay | Admitting: Internal Medicine

## 2015-08-31 ENCOUNTER — Emergency Department (HOSPITAL_COMMUNITY): Payer: Medicare Other

## 2015-08-31 ENCOUNTER — Inpatient Hospital Stay (HOSPITAL_COMMUNITY): Payer: Medicare Other

## 2015-08-31 DIAGNOSIS — G35 Multiple sclerosis: Secondary | ICD-10-CM | POA: Diagnosis present

## 2015-08-31 DIAGNOSIS — N319 Neuromuscular dysfunction of bladder, unspecified: Secondary | ICD-10-CM | POA: Diagnosis present

## 2015-08-31 DIAGNOSIS — L89153 Pressure ulcer of sacral region, stage 3: Secondary | ICD-10-CM | POA: Diagnosis present

## 2015-08-31 DIAGNOSIS — N390 Urinary tract infection, site not specified: Secondary | ICD-10-CM | POA: Diagnosis not present

## 2015-08-31 DIAGNOSIS — R410 Disorientation, unspecified: Secondary | ICD-10-CM | POA: Diagnosis present

## 2015-08-31 DIAGNOSIS — D638 Anemia in other chronic diseases classified elsewhere: Secondary | ICD-10-CM | POA: Diagnosis not present

## 2015-08-31 DIAGNOSIS — A419 Sepsis, unspecified organism: Principal | ICD-10-CM

## 2015-08-31 DIAGNOSIS — Z87891 Personal history of nicotine dependence: Secondary | ICD-10-CM | POA: Diagnosis not present

## 2015-08-31 DIAGNOSIS — E870 Hyperosmolality and hypernatremia: Secondary | ICD-10-CM | POA: Diagnosis present

## 2015-08-31 DIAGNOSIS — Z833 Family history of diabetes mellitus: Secondary | ICD-10-CM | POA: Diagnosis not present

## 2015-08-31 DIAGNOSIS — Z7901 Long term (current) use of anticoagulants: Secondary | ICD-10-CM | POA: Diagnosis not present

## 2015-08-31 DIAGNOSIS — G9340 Encephalopathy, unspecified: Secondary | ICD-10-CM

## 2015-08-31 DIAGNOSIS — Z820 Family history of epilepsy and other diseases of the nervous system: Secondary | ICD-10-CM | POA: Diagnosis not present

## 2015-08-31 DIAGNOSIS — Z86718 Personal history of other venous thrombosis and embolism: Secondary | ICD-10-CM | POA: Diagnosis not present

## 2015-08-31 DIAGNOSIS — E860 Dehydration: Secondary | ICD-10-CM | POA: Diagnosis present

## 2015-08-31 DIAGNOSIS — Z7984 Long term (current) use of oral hypoglycemic drugs: Secondary | ICD-10-CM | POA: Diagnosis not present

## 2015-08-31 DIAGNOSIS — N183 Chronic kidney disease, stage 3 (moderate): Secondary | ICD-10-CM | POA: Diagnosis present

## 2015-08-31 DIAGNOSIS — E1122 Type 2 diabetes mellitus with diabetic chronic kidney disease: Secondary | ICD-10-CM | POA: Diagnosis present

## 2015-08-31 LAB — COMPREHENSIVE METABOLIC PANEL
ALBUMIN: 3.2 g/dL — AB (ref 3.5–5.0)
ALT: 13 U/L — ABNORMAL LOW (ref 14–54)
ALT: 15 U/L (ref 14–54)
ANION GAP: 11 (ref 5–15)
AST: 16 U/L (ref 15–41)
AST: 19 U/L (ref 15–41)
Albumin: 2.9 g/dL — ABNORMAL LOW (ref 3.5–5.0)
Alkaline Phosphatase: 66 U/L (ref 38–126)
Alkaline Phosphatase: 59 U/L (ref 38–126)
Anion gap: 14 (ref 5–15)
BILIRUBIN TOTAL: 0.6 mg/dL (ref 0.3–1.2)
BUN: 61 mg/dL — AB (ref 6–20)
BUN: 70 mg/dL — AB (ref 6–20)
CHLORIDE: 109 mmol/L (ref 101–111)
CO2: 26 mmol/L (ref 22–32)
CO2: 26 mmol/L (ref 22–32)
Calcium: 10.8 mg/dL — ABNORMAL HIGH (ref 8.9–10.3)
Calcium: 10.4 mg/dL — ABNORMAL HIGH (ref 8.9–10.3)
Chloride: 115 mmol/L — ABNORMAL HIGH (ref 101–111)
Creatinine, Ser: 1.39 mg/dL — ABNORMAL HIGH (ref 0.44–1.00)
Creatinine, Ser: 1.21 mg/dL — ABNORMAL HIGH (ref 0.44–1.00)
GFR calc Af Amer: 48 mL/min — ABNORMAL LOW (ref >60)
GFR calc non Af Amer: 41 mL/min — ABNORMAL LOW (ref >60)
GFR, EST AFRICAN AMERICAN: 57 mL/min — AB (ref >60)
GFR, EST NON AFRICAN AMERICAN: 49 mL/min — AB (ref >60)
GLUCOSE: 161 mg/dL — AB (ref 65–99)
Glucose, Bld: 132 mg/dL — ABNORMAL HIGH (ref 65–99)
POTASSIUM: 3.7 mmol/L (ref 3.5–5.1)
POTASSIUM: 4.1 mmol/L (ref 3.5–5.1)
SODIUM: 149 mmol/L — AB (ref 135–145)
Sodium: 152 mmol/L — ABNORMAL HIGH (ref 135–145)
TOTAL PROTEIN: 8.3 g/dL — AB (ref 6.5–8.1)
TOTAL PROTEIN: 8.8 g/dL — AB (ref 6.5–8.1)
Total Bilirubin: 0.6 mg/dL (ref 0.3–1.2)

## 2015-08-31 LAB — URINALYSIS, ROUTINE W REFLEX MICROSCOPIC
Bilirubin Urine: NEGATIVE
Glucose, UA: NEGATIVE mg/dL
Ketones, ur: NEGATIVE mg/dL
Nitrite: POSITIVE — AB
PROTEIN: 100 mg/dL — AB
SPECIFIC GRAVITY, URINE: 1.014 (ref 1.005–1.030)
pH: 8 (ref 5.0–8.0)

## 2015-08-31 LAB — GLUCOSE, CAPILLARY
GLUCOSE-CAPILLARY: 122 mg/dL — AB (ref 65–99)
GLUCOSE-CAPILLARY: 142 mg/dL — AB (ref 65–99)
Glucose-Capillary: 114 mg/dL — ABNORMAL HIGH (ref 65–99)
Glucose-Capillary: 114 mg/dL — ABNORMAL HIGH (ref 65–99)

## 2015-08-31 LAB — CBC WITH DIFFERENTIAL/PLATELET
BASOS ABS: 0.1 10*3/uL (ref 0.0–0.1)
BASOS PCT: 0 %
Basophils Absolute: 0 10*3/uL (ref 0.0–0.1)
Basophils Relative: 0 %
EOS PCT: 1 %
EOS PCT: 1 %
Eosinophils Absolute: 0.1 10*3/uL (ref 0.0–0.7)
Eosinophils Absolute: 0.2 10*3/uL (ref 0.0–0.7)
HEMATOCRIT: 30 % — AB (ref 36.0–46.0)
HEMATOCRIT: 39.7 % (ref 36.0–46.0)
HEMOGLOBIN: 13 g/dL (ref 12.0–15.0)
Hemoglobin: 9.5 g/dL — ABNORMAL LOW (ref 12.0–15.0)
LYMPHS ABS: 2.5 10*3/uL (ref 0.7–4.0)
LYMPHS ABS: 2.6 10*3/uL (ref 0.7–4.0)
LYMPHS PCT: 15 %
Lymphocytes Relative: 16 %
MCH: 27.1 pg (ref 26.0–34.0)
MCH: 28.1 pg (ref 26.0–34.0)
MCHC: 31.7 g/dL (ref 30.0–36.0)
MCHC: 32.7 g/dL (ref 30.0–36.0)
MCV: 85.7 fL (ref 78.0–100.0)
MCV: 85.9 fL (ref 78.0–100.0)
MONO ABS: 1.3 10*3/uL — AB (ref 0.1–1.0)
Monocytes Absolute: 1.3 10*3/uL — ABNORMAL HIGH (ref 0.1–1.0)
Monocytes Relative: 7 %
Monocytes Relative: 8 %
NEUTROS ABS: 13 10*3/uL — AB (ref 1.7–7.7)
NEUTROS PCT: 77 %
Neutro Abs: 12.4 10*3/uL — ABNORMAL HIGH (ref 1.7–7.7)
Neutrophils Relative %: 75 %
PLATELETS: 384 10*3/uL (ref 150–400)
Platelets: 505 10*3/uL — ABNORMAL HIGH (ref 150–400)
RBC: 3.5 MIL/uL — ABNORMAL LOW (ref 3.87–5.11)
RBC: 4.62 MIL/uL (ref 3.87–5.11)
RDW: 19 % — AB (ref 11.5–15.5)
RDW: 19.2 % — ABNORMAL HIGH (ref 11.5–15.5)
WBC: 16.5 10*3/uL — ABNORMAL HIGH (ref 4.0–10.5)
WBC: 16.8 10*3/uL — AB (ref 4.0–10.5)

## 2015-08-31 LAB — LACTIC ACID, PLASMA
LACTIC ACID, VENOUS: 2.6 mmol/L — AB (ref 0.5–2.0)
Lactic Acid, Venous: 3 mmol/L (ref 0.5–2.0)

## 2015-08-31 LAB — URINE MICROSCOPIC-ADD ON

## 2015-08-31 LAB — MRSA PCR SCREENING: MRSA BY PCR: NEGATIVE

## 2015-08-31 LAB — I-STAT CG4 LACTIC ACID, ED
LACTIC ACID, VENOUS: 2.18 mmol/L — AB (ref 0.5–2.0)
Lactic Acid, Venous: 1.51 mmol/L (ref 0.5–2.0)

## 2015-08-31 LAB — PROCALCITONIN: PROCALCITONIN: 5.5 ng/mL

## 2015-08-31 MED ORDER — BACLOFEN 10 MG PO TABS
10.0000 mg | ORAL_TABLET | Freq: Three times a day (TID) | ORAL | Status: DC
  Administered 2015-08-31 – 2015-09-01 (×3): 10 mg via ORAL
  Filled 2015-08-31 (×4): qty 1

## 2015-08-31 MED ORDER — FAMOTIDINE 20 MG PO TABS
20.0000 mg | ORAL_TABLET | Freq: Two times a day (BID) | ORAL | Status: DC
Start: 2015-08-31 — End: 2015-09-01
  Administered 2015-08-31 – 2015-09-01 (×2): 20 mg via ORAL
  Filled 2015-08-31 (×3): qty 1

## 2015-08-31 MED ORDER — CEFEPIME HCL 2 G IJ SOLR: 2.0000 g | Freq: Once | INTRAMUSCULAR | Status: DC

## 2015-08-31 MED ORDER — POTASSIUM CHLORIDE 2 MEQ/ML IV SOLN
INTRAVENOUS | Status: DC
  Administered 2015-08-31 – 2015-09-01 (×2): via INTRAVENOUS
  Filled 2015-08-31 (×5): qty 1000

## 2015-08-31 MED ORDER — PRAVASTATIN SODIUM 20 MG PO TABS
20.0000 mg | ORAL_TABLET | Freq: Every day | ORAL | Status: DC
  Administered 2015-08-31: 20 mg via ORAL
  Filled 2015-08-31: qty 1

## 2015-08-31 MED ORDER — INSULIN ASPART 100 UNIT/ML ~~LOC~~ SOLN
0.0000 [IU] | Freq: Three times a day (TID) | SUBCUTANEOUS | Status: DC
  Administered 2015-08-31 – 2015-09-01 (×2): 1 [IU] via SUBCUTANEOUS
  Administered 2015-09-01: 5 [IU] via SUBCUTANEOUS

## 2015-08-31 MED ORDER — GABAPENTIN 100 MG PO CAPS
100.0000 mg | ORAL_CAPSULE | Freq: Every day | ORAL | Status: DC
  Administered 2015-08-31: 100 mg via ORAL
  Filled 2015-08-31: qty 1

## 2015-08-31 MED ORDER — DEXTROSE 5 % IV SOLN
2.0000 g | Freq: Once | INTRAVENOUS | Status: AC
  Administered 2015-08-31: 2 g via INTRAVENOUS
  Filled 2015-08-31: qty 2

## 2015-08-31 MED ORDER — SODIUM CHLORIDE 0.9 % IV BOLUS (SEPSIS)
500.0000 mL | Freq: Once | INTRAVENOUS | Status: AC
  Administered 2015-08-31: 500 mL via INTRAVENOUS

## 2015-08-31 MED ORDER — BISACODYL 10 MG RE SUPP: 10.0000 mg | Freq: Every day | RECTAL | Status: DC | PRN

## 2015-08-31 MED ORDER — ALBUTEROL SULFATE (2.5 MG/3ML) 0.083% IN NEBU: 2.5000 mg | INHALATION_SOLUTION | RESPIRATORY_TRACT | Status: DC | PRN

## 2015-08-31 MED ORDER — PRO-STAT SUGAR FREE PO LIQD
30.0000 mL | Freq: Two times a day (BID) | ORAL | Status: DC
  Administered 2015-08-31 – 2015-09-01 (×2): 30 mL via ORAL
  Filled 2015-08-31 (×3): qty 30

## 2015-08-31 MED ORDER — SODIUM CHLORIDE 0.9 % IV BOLUS (SEPSIS)
1000.0000 mL | INTRAVENOUS | Status: AC
  Administered 2015-08-31 (×2): 1000 mL via INTRAVENOUS

## 2015-08-31 MED ORDER — DEXTROSE 5 % IV SOLN
1.0000 g | Freq: Two times a day (BID) | INTRAVENOUS | Status: DC
  Administered 2015-08-31 – 2015-09-01 (×3): 1 g via INTRAVENOUS
  Filled 2015-08-31 (×4): qty 1

## 2015-08-31 MED ORDER — HYDROCODONE-ACETAMINOPHEN 5-325 MG PO TABS
1.0000 | ORAL_TABLET | Freq: Four times a day (QID) | ORAL | Status: DC | PRN
Start: 2015-08-31 — End: 2015-09-01

## 2015-08-31 MED ORDER — RIVAROXABAN 20 MG PO TABS
20.0000 mg | ORAL_TABLET | Freq: Every day | ORAL | Status: DC
  Administered 2015-08-31: 20 mg via ORAL
  Filled 2015-08-31: qty 1

## 2015-08-31 MED ORDER — SODIUM CHLORIDE 0.45 % IV SOLN
INTRAVENOUS | Status: DC
  Filled 2015-08-31 (×2): qty 1000

## 2015-08-31 MED ORDER — OMEGA-3-ACID ETHYL ESTERS 1 G PO CAPS
1000.0000 mg | ORAL_CAPSULE | Freq: Every day | ORAL | Status: DC
Start: 2015-08-31 — End: 2015-09-01
  Administered 2015-09-01: 1000 mg via ORAL
  Filled 2015-08-31 (×2): qty 1

## 2015-08-31 NOTE — ED Provider Notes (Signed)
Patient seen/examined in the Emergency Department in conjunction with Midlevel Provider [place] Patient presents with confusion/fever.  She lives in nursing facility Exam : awake/alert, but she is confused.  She has indwelling foley No obvious infected wounds on legs/sacrum on my evaluation with nurse Plan: admit for UTI with confusion    Ripley Fraise, MD 08/31/15 [phone]

## 2015-08-31 NOTE — Progress Notes (Signed)
CRITICAL VALUE ALERT  Critical value received: Lactic Acid 3.0  Date of notification:  08/31/2015  Time of notification: 10:34AM  Critical value read back:Yes  Nurse who received alert: Dorita Fray  MD notified (1st page): MD Allyson Sabal  Time of first page:10:35 AM  Responding MD:  MD Allyson Sabal   Time MD responded:  10:40AM

## 2015-08-31 NOTE — Progress Notes (Signed)
Patient seen and examined  55 y.o. female with history of multiple sclerosis was brought from the nursing home after patient was found to have increasing confusion. Patient is completely confused and does not provide any history at this time and I am unable to reach family for further history. Most of the history is obtained from the ER PA and records. Patient was found to be confused and brought to the ER and UA shows features consistent with UTI. Patient is mildly febrile. Lactic acid was initially elevated which improved with fluid bolus. On my exam patient only repeats what I say and only moves her left upper extremity. Baseline not known at this time. Patient had similar picture of presentation in February 2017. Patient is being admitted for sepsis from UTI with acute encephalopathy.   Assessment and plan 1. Sepsis from UTI - patient was tachycardic and febrile with leukocytosis and source of infection UA compatible with sepsis criteria. Patient has been placed on cefepime for healthcare associated UTI for sepsis. Continue with hydration. Follow blood cultures urine cultures. Follow lactic acid levels. 2. Acute encephalopathy from sepsis - closely monitor mental status. CT head is negative . If does not improve the need MRI brain. 3. History of DVT of the lower extremity on Xarelto - resume 4. Diabetes mellitus type 2 - will keep patient on standing scale coverage and hold metformin while inpatient.A1c:5.8 5. History of multiple sclerosis Stable, continue on Neurontin, baclofen when mental status clears up. Pt with neurogenic bladder and has chronic foley. 6. Decubitus ulcers - will get wound team consult. 7. Renal failure at this time think it is chronic stasis 3 - hold Lasix for now. Continue hydration and follow metabolic panel. 8. Hypernatremia - probably from dehydration. IV fluids changed to quarter normal saline 9. Decubitus ulcer, stage 3 (HCC) -improving, recently treated with cipro from  what appears to be previous hospitalization due to worsening ulcer. However no signs of infection  . Wound care consultation appreciated

## 2015-08-31 NOTE — ED Notes (Signed)
Lab notified that RN unable to get blood during IV start

## 2015-08-31 NOTE — ED Provider Notes (Signed)
CSN: [id number]     Arrival date & time 08/30/15  2312 History   First MD Initiated Contact with Patient 08/30/15 2334     Chief Complaint  Patient presents with  . Fever  . Altered Mental Status     (Consider location/radiation/quality/duration/timing/severity/associated sxs/prior Treatment) HPI Patient presents to the emergency department with altered mental status and an elevated white count with a fever.  The patient was sent from a nursing facility.  She is normally alert and oriented and able to ambulate with a walker and the patient will be fully catheter patient history at this time.  Facility reported no vomiting, diarrhea, bloody stool or syncope Past Medical History  Diagnosis Date  . Diabetes mellitus without complication ([place])   . Multiple sclerosis diagnosed 2002-not on Therapy any longer 06/26/2012  . Stage 4 decubitus ulcer ([place]) 07/05/2015  . Acute renal failure superimposed on stage 3 chronic kidney disease ([place]) 07/21/2015  . Malnutrition of moderate degree 07/21/2015   Past Surgical History  Procedure Laterality Date  . Hernia mesh removal    . Tubes tided    . Tubal ligation    . Esophagogastroduodenoscopy Left 01/02/2015    Procedure: ESOPHAGOGASTRODUODENOSCOPY (EGD);  Surgeon: Arta Silence, MD;  Location: [facility];  Service: Endoscopy;  Laterality: Left;   Family History  Problem Relation Age of Onset  . Diabetes Mother   . Diabetes Father   . Diabetes Sister   . Scoliosis Sister   . Alzheimer's disease Mother    Social History  Substance Use Topics  . Smoking status: Former Smoker -- 1.00 packs/day for 20 years    Types: Cigarettes    Quit date: 11/19/2014  . Smokeless tobacco: Never Used  . Alcohol Use: 3.0 oz/week    5 Glasses of wine per week   OB History    No data available     Review of Systems  Level 5 [place] applies due to altered mental status  Allergies  Review of patient's allergies indicates no known allergies.  Home  Medications   Prior to Admission medications   Medication Sig Start Date End Date Taking? Authorizing Provider  Acetaminophen (APAP) 325 MG tablet Take 650 mg by mouth every 6 (six) hours as needed for pain.    Historical Provider, MD  albuterol (PROVENTIL HFA;VENTOLIN HFA) 108 (90 BASE) MCG/ACT inhaler Inhale 2 puffs into the lungs every 4 (four) hours as needed for wheezing or shortness of breath. Patient taking differently: Inhale 2 puffs into the lungs every 4 (four) hours as needed for wheezing or shortness of breath. PROAIR 06/29/12   [name], MD  AMBULATORY NON FORMULARY MEDICATION Med Pass Offer 120 ml four times daily for weight loss.    Historical Provider, MD  Amino Acids-Protein Hydrolys (FEEDING SUPPLEMENT, PRO-STAT SUGAR FREE 64,) LIQD Take 30 mLs by mouth 2 (two) times daily. 07/09/15   Florencia Reasons, MD  baclofen (LIORESAL) 10 MG tablet Take 10 mg by mouth 3 (three) times daily.     Historical Provider, MD  bisacodyl (DULCOLAX) 10 MG suppository Place 10 mg rectally daily as needed for moderate constipation (only give 1 dose in 24 hours).    Historical Provider, MD  famotidine (PEPCID) 20 MG tablet Take 1 tablet (20 mg total) by mouth 2 (two) times daily. 07/09/15   Florencia Reasons, MD  furosemide (LASIX) 40 MG tablet Take 1 tablet (40 mg total) by mouth daily as needed for fluid or edema. Reported on 07/05/2015 07/09/15  Florencia Reasons, MD  gabapentin (NEURONTIN) 100 MG capsule Take 100 mg by mouth at bedtime.    Historical Provider, MD  HYDROcodone-acetaminophen (NORCO) 5-325 MG tablet Take 1 tablet by mouth every 6 (six) hours as needed for moderate pain. 07/23/15   Geradine Girt, DO  lovastatin (MEVACOR) 20 MG tablet Take 20 mg by mouth daily.    Historical Provider, MD  Magnesium Hydroxide (MILK OF MAGNESIA PO) Take 30 mLs by mouth daily as needed.    Historical Provider, MD  metFORMIN (GLUCOPHAGE) 500 MG tablet Take 500 mg by mouth 2 (two) times daily. 12/08/14   Historical Provider, MD  Multiple  Vitamin (MULTIVITAMIN WITH MINERALS) TABS tablet Take 1 tablet by mouth daily. 07/09/15   Florencia Reasons, MD  Omega 3 1000 MG CAPS Take 1,000 mg by mouth daily.    Historical Provider, MD  rivaroxaban (XARELTO) 20 MG TABS tablet Take 20 mg by mouth daily with supper. Reported on 08/12/2015    Historical Provider, MD  Sodium Phosphates (RA SALINE ENEMA RE) Place 1 application rectally daily as needed (severe constipation, do not exceed 1 dose in 24 hours, if still no relief contact MD).    Historical Provider, MD  solifenacin (VESICARE) 5 MG tablet Take 5 mg by mouth daily.    Historical Provider, MD  vitamin C (ASCORBIC ACID) 500 MG tablet Take 500 mg by mouth daily.    Historical Provider, MD   BP 100/60 mmHg  Pulse 92  Resp 13  Ht 5\' 3"  (1.6 m)  Wt 57.607 kg  BMI 22.50 kg/m2  SpO2 100% Physical Exam  Constitutional: She appears well-developed and well-nourished. No distress.  HENT:  Head: Normocephalic and atraumatic.  Mouth/Throat: Oropharynx is clear and moist.  Eyes: Pupils are equal, round, and reactive to light.  Neck: Normal range of motion. Neck supple.  Cardiovascular: Normal rate, regular rhythm and normal heart sounds.  Exam reveals no gallop and no friction rub.   No murmur heard. Pulmonary/Chest: Effort normal and breath sounds normal. No respiratory distress. She has no wheezes.  Abdominal: Soft. Bowel sounds are normal. She exhibits no distension. There is no tenderness.  Neurological: She is alert. She exhibits normal muscle tone. Coordination normal.  Skin: Skin is warm and dry. No rash noted. No erythema.  Psychiatric: She has a normal mood and affect. Her behavior is normal.  Nursing note and vitals reviewed.   ED Course  Procedures (including critical care time) Labs Review Labs Reviewed  COMPREHENSIVE METABOLIC PANEL - Abnormal; Notable for the following:    Sodium 149 (*)    Glucose, Bld 161 (*)    BUN 70 (*)    Creatinine, Ser 1.39 (*)    Calcium 10.8 (*)     Total Protein 8.8 (*)    Albumin 3.2 (*)    GFR calc non Af Amer 41 (*)    GFR calc Af Amer 48 (*)    All other components within normal limits  URINALYSIS, ROUTINE W REFLEX MICROSCOPIC (NOT AT [facility]) - Abnormal; Notable for the following:    APPearance TURBID (*)    Hgb urine dipstick LARGE (*)    Protein, ur 100 (*)    Nitrite POSITIVE (*)    Leukocytes, UA LARGE (*)    All other components within normal limits  CBC WITH DIFFERENTIAL/PLATELET - Abnormal; Notable for the following:    WBC 16.8 (*)    RDW 19.2 (*)    Neutro Abs 13.0 (*)  Monocytes Absolute 1.3 (*)    All other components within normal limits  URINE MICROSCOPIC-ADD ON - Abnormal; Notable for the following:    Squamous Epithelial / LPF 0-5 (*)    Bacteria, UA MANY (*)    Crystals TRIPLE PHOSPHATE CRYSTALS (*)    All other components within normal limits  I-STAT CG4 LACTIC ACID, ED - Abnormal; Notable for the following:    Lactic Acid, Venous 2.18 (*)    All other components within normal limits  CULTURE, BLOOD (ROUTINE X 2)  CULTURE, BLOOD (ROUTINE X 2)  URINE CULTURE    Imaging Review No results found. I have personally reviewed and evaluated these images and lab results as part of my medical decision-making.   Patient will need to be admitted to the hospital for what appears to be early sepsis from urinary source.  I will speak to the [facility] about the patient.  CRITICAL CARE Performed by: Brent General Total critical care time: 40 minutes Critical care time was exclusive of separately billable procedures and treating other patients. Critical care was necessary to treat or prevent imminent or life-threatening deterioration. Critical care was time spent personally by me on the following activities: development of treatment plan with patient and/or surrogate as well as nursing, discussions with consultants, evaluation of patient's response to treatment, examination of patient, obtaining  history from patient or surrogate, ordering and performing treatments and interventions, ordering and review of laboratory studies, ordering and review of radiographic studies, pulse oximetry and re-evaluation of patient's condition.   Dalia Heading, PA-C 08/31/15 0202  Ripley Fraise, MD 08/31/15 [phone]

## 2015-08-31 NOTE — NC FL2 (Signed)
[place] LEVEL OF CARE SCREENING TOOL     IDENTIFICATION  Patient Name: [name] Birthdate: 1960-07-21 Sex: female Admission Date (Current Location): 08/30/2015  [facility] and [place] Number:  Herbalist and Address:  [facility] [place], [place] [address], [place], [place] [address]      Provider Number: [id number]  Attending Physician Name and Address:  Reyne Dumas, MD  Relative Name and Phone Number:  [name] [phone]    Current Level of Care: Hospital Recommended Level of Care: [place] Prior Approval Number:    Date Approved/Denied:   PASRR Number:    Discharge Plan: SNF    Current Diagnoses: Patient Active Problem List   Diagnosis Date Noted  . UTI (lower urinary tract infection) 08/31/2015  . Sepsis ([place]) 08/31/2015  . Hypernatremia 08/31/2015  . Dyslipidemia associated with type 2 diabetes mellitus ([place]) 08/28/2015  . GERD without esophagitis 08/28/2015  . Acute renal failure superimposed on stage 3 chronic kidney disease ([place]) 07/21/2015  . Leukocytosis 07/21/2015  . Anemia of chronic disease 07/21/2015  . Hyperkalemia 07/21/2015  . UTI (urinary tract infection) 07/21/2015  . Malnutrition of moderate degree 07/21/2015  . Stage 4 decubitus ulcer ([place]) 07/05/2015  . Diabetes mellitus type 2, controlled ([place]) 07/05/2015  . Acute encephalopathy 12/28/2014  . DVT, lower extremity ([place]) 12/28/2014  . Multiple sclerosis diagnosed 2002-not on Therapy any longer 06/26/2012    Orientation RESPIRATION BLADDER Height & Weight     Self, Time, Place  Normal Continent, Indwelling catheter (Urinary catheter) Weight: 56.246 kg (124 lb) Height:  5\' 6"  (167.6 cm)  BEHAVIORAL SYMPTOMS/MOOD NEUROLOGICAL BOWEL NUTRITION STATUS   (N/A)   Continent Diet (Please see DC summary)  AMBULATORY STATUS COMMUNICATION OF NEEDS Skin   Extensive Assist Verbally Other (Comment) (laceration on buttocks)                      Personal Care Assistance Level of Assistance  Bathing, Feeding, Dressing Bathing Assistance: Maximum assistance Feeding assistance: Limited assistance Dressing Assistance: Maximum assistance     Functional Limitations Info             SPECIAL CARE FACTORS FREQUENCY  PT (By licensed PT)     PT Frequency: not yet assessed              Contractures Contractures Info: Not present    Additional Factors Info  Code Status, Allergies, Insulin Sliding Scale Code Status Info: Full Allergies Info: NKA   Insulin Sliding Scale Info: insulin aspart (novoLOG) injection 0-9 Units       Current Medications (08/31/2015):  This is the current hospital active medication list Current Facility-Administered Medications  Medication Dose Route Frequency Provider Last Rate Last Dose  . albuterol (PROVENTIL) (2.5 MG/3ML) 0.083% nebulizer solution 2.5 mg  2.5 mg Inhalation Q4H PRN Rise Patience, MD      . baclofen (LIORESAL) tablet 10 mg  10 mg Oral TID Rise Patience, MD   10 mg at 08/31/15 1157  . bisacodyl (DULCOLAX) suppository 10 mg  10 mg Rectal Daily PRN Rise Patience, MD      . ceFEPIme (MAXIPIME) 1 g in dextrose 5 % 50 mL IVPB  1 g Intravenous Q12H Rise Patience, MD   1 g at 08/31/15 1219  . dextrose 5 % and 0.2 % NaCl 1,000 mL with potassium chloride 10 mEq infusion   Intravenous Continuous Reyne Dumas, MD 75 mL/hr  at 08/31/15 1416    . famotidine (PEPCID) tablet 20 mg  20 mg Oral BID Rise Patience, MD   Stopped at 08/31/15 1000  . feeding supplement (PRO-STAT SUGAR FREE 64) liquid 30 mL  30 mL Oral BID Rise Patience, MD   30 mL at 08/31/15 1000  . gabapentin (NEURONTIN) capsule 100 mg  100 mg Oral QHS Rise Patience, MD      . HYDROcodone-acetaminophen (NORCO/VICODIN) 5-325 MG per tablet 1 tablet  1 tablet Oral Q6H PRN Rise Patience, MD      . insulin aspart (novoLOG) injection 0-9 Units  0-9 Units Subcutaneous TID WC Rise Patience, MD   1 Units at 08/31/15 1238  . omega-3 acid ethyl esters (LOVAZA) capsule 1,000 mg  1,000 mg Oral Daily Rise Patience, MD   Stopped at 08/31/15 1000  . pravastatin (PRAVACHOL) tablet 20 mg  20 mg Oral q1800 Rise Patience, MD      . rivaroxaban [name]) tablet 20 mg  20 mg Oral Q supper Rise Patience, MD         Discharge Medications: Please see discharge summary for a list of discharge medications.  Relevant Imaging Results:  Relevant Lab Results:   Additional Information SSN: [id number]  Benard Halsted, LCSWA

## 2015-08-31 NOTE — H&P (Signed)
[facility] History and Physical  [name] [id number] DOB: October 04, 1960 DOA: 08/30/2015  Referring physician: Mr. Montine Circle. PCP: Gildardo Cranker, DO  Specialists: None.  Chief Complaint: Increasing confusion.  HPI: [name] is a 55 y.o. female with history of multiple sclerosis was brought from the nursing home after patient was found to have increasing confusion. Patient is completely confused and does not provide any history at this time and I am unable to reach family for further history. Most of the history is obtained from the ER PA and records. Patient was found to be confused and brought to the ER and UA shows features consistent with UTI. Patient is mildly febrile. Lactic acid was initially elevated which improved with fluid bolus. On my exam patient only repeats what I say and only moves her left upper extremity. Baseline not known at this time. Patient had similar picture of presentation in February 2017. Patient is being admitted for sepsis from UTI with acute encephalopathy.   Review of Systems: As presented in the history of presenting illness, rest negative.  Past Medical History  Diagnosis Date  . Diabetes mellitus without complication ([place])   . Multiple sclerosis diagnosed 2002-not on Therapy any longer 06/26/2012  . Stage 4 decubitus ulcer ([place]) 07/05/2015  . Acute renal failure superimposed on stage 3 chronic kidney disease ([place]) 07/21/2015  . Malnutrition of moderate degree 07/21/2015   Past Surgical History  Procedure Laterality Date  . Hernia mesh removal    . Tubes tided    . Tubal ligation    . Esophagogastroduodenoscopy Left 01/02/2015    Procedure: ESOPHAGOGASTRODUODENOSCOPY (EGD);  Surgeon: Arta Silence, MD;  Location: [facility];  Service: Endoscopy;  Laterality: Left;   Social History:  reports that she quit smoking about 9 months ago. Her smoking use included Cigarettes. She has a 20 pack-year smoking history. She has never used  smokeless tobacco. She reports that she drinks about 3.0 oz of alcohol per week. She reports that she does not use illicit drugs. Where does patient live Nursing home. Can patient participate in ADLs? No.  No Known Allergies  Family History:  Family History  Problem Relation Age of Onset  . Diabetes Mother   . Diabetes Father   . Diabetes Sister   . Scoliosis Sister   . Alzheimer's disease Mother       Prior to Admission medications   Medication Sig Start Date End Date Taking? Authorizing Provider  Acetaminophen (APAP) 325 MG tablet Take 650 mg by mouth every 6 (six) hours as needed for pain.   Yes Historical Provider, MD  albuterol (PROVENTIL HFA;VENTOLIN HFA) 108 (90 BASE) MCG/ACT inhaler Inhale 2 puffs into the lungs every 4 (four) hours as needed for wheezing or shortness of breath. Patient taking differently: Inhale 2 puffs into the lungs every 4 (four) hours as needed for wheezing or shortness of breath. PROAIR 06/29/12  Yes [name], MD  AMBULATORY NON FORMULARY MEDICATION Med Pass Offer 120 ml four times daily for weight loss.   Yes Historical Provider, MD  Amino Acids-Protein Hydrolys (FEEDING SUPPLEMENT, PRO-STAT SUGAR FREE 64,) LIQD Take 30 mLs by mouth 2 (two) times daily. 07/09/15  Yes Florencia Reasons, MD  baclofen (LIORESAL) 10 MG tablet Take 10 mg by mouth 3 (three) times daily.    Yes Historical Provider, MD  bisacodyl (DULCOLAX) 10 MG suppository Place 10 mg rectally daily as needed for moderate constipation (only give 1 dose in 24 hours).   Yes Historical  Provider, MD  famotidine (PEPCID) 20 MG tablet Take 1 tablet (20 mg total) by mouth 2 (two) times daily. 07/09/15  Yes Florencia Reasons, MD  furosemide (LASIX) 40 MG tablet Take 1 tablet (40 mg total) by mouth daily as needed for fluid or edema. Reported on 07/05/2015 07/09/15  Yes Florencia Reasons, MD  gabapentin (NEURONTIN) 100 MG capsule Take 100 mg by mouth at bedtime.   Yes Historical Provider, MD  HYDROcodone-acetaminophen (NORCO) 5-325  MG tablet Take 1 tablet by mouth every 6 (six) hours as needed for moderate pain. 07/23/15  Yes Geradine Girt, DO  lovastatin (MEVACOR) 20 MG tablet Take 20 mg by mouth daily.   Yes Historical Provider, MD  Magnesium Hydroxide (MILK OF MAGNESIA PO) Take 30 mLs by mouth daily as needed.   Yes Historical Provider, MD  metFORMIN (GLUCOPHAGE) 500 MG tablet Take 500 mg by mouth 2 (two) times daily. 12/08/14  Yes Historical Provider, MD  Multiple Vitamin (MULTIVITAMIN WITH MINERALS) TABS tablet Take 1 tablet by mouth daily. 07/09/15  Yes Florencia Reasons, MD  Omega 3 1000 MG CAPS Take 1,000 mg by mouth daily.   Yes Historical Provider, MD  rivaroxaban (XARELTO) 20 MG TABS tablet Take 20 mg by mouth daily with supper. Reported on 08/12/2015   Yes Historical Provider, MD  Sodium Phosphates (RA SALINE ENEMA RE) Place 1 application rectally daily as needed (severe constipation, do not exceed 1 dose in 24 hours, if still no relief contact MD).   Yes Historical Provider, MD  solifenacin (VESICARE) 5 MG tablet Take 5 mg by mouth daily.   Yes Historical Provider, MD  vitamin C (ASCORBIC ACID) 500 MG tablet Take 500 mg by mouth daily.   Yes Historical Provider, MD    Physical Exam: Filed Vitals:   08/31/15 0400 08/31/15 0430 08/31/15 0500 08/31/15 0530  BP: 110/79 110/75 107/65 98/62  Pulse: 84 85 83 80  Temp:      TempSrc:      Resp: 13 14 17 13   Height:      Weight:      SpO2: 100% 100% 100% 99%     General:  Moderately built and nourished.  Eyes: Anicteric no pallor.  ENT: No discharge from the ears eyes nose or mouth.  Neck: No mass felt.  Cardiovascular: S1 and S2 heard.  Respiratory: No rhonchi or crepitations.  Abdomen: Soft nontender bowel sounds present.  Skin: Decubitus ulcer in the sacrum and heels.  Musculoskeletal: No edema.  Psychiatric: Appears confused.  Neurologic: Appears confused. Only moves left upper extremity.  Labs on Admission:  Basic Metabolic Panel:  Recent Labs Lab  08/31/15 0030  NA 149*  K 4.1  CL 109  CO2 26  GLUCOSE 161*  BUN 70*  CREATININE 1.39*  CALCIUM 10.8*   Liver Function Tests:  Recent Labs Lab 08/31/15 0030  AST 19  ALT 15  ALKPHOS 66  BILITOT 0.6  PROT 8.8*  ALBUMIN 3.2*   No results for input(s): LIPASE, AMYLASE in the last 168 hours. No results for input(s): AMMONIA in the last 168 hours. CBC:  Recent Labs Lab 08/31/15 0030  WBC 16.8*  NEUTROABS 13.0*  HGB 13.0  HCT 39.7  MCV 85.9  PLT 384   Cardiac Enzymes: No results for input(s): CKTOTAL, CKMB, CKMBINDEX, TROPONINI in the last 168 hours.  BNP (last 3 results)  Recent Labs  12/13/14 1620  BNP 56.9    ProBNP (last 3 results) No results for input(s): PROBNP in  the last 8760 hours.  CBG: No results for input(s): GLUCAP in the last 168 hours.  Radiological Exams on Admission: Dg Chest Port 1 View  08/31/2015  CLINICAL DATA:  Confusion. EXAM: PORTABLE CHEST 1 VIEW COMPARISON:  07/20/2015 FINDINGS: Shallow inspiration. Normal heart size and pulmonary vascularity. No focal airspace disease or consolidation in the lungs. No blunting of costophrenic angles. No pneumothorax. Mediastinal contours appear intact. IMPRESSION: No active disease. Electronically Signed   By: Lucienne Capers M.D.   On: 08/31/2015 01:15     Assessment/Plan Principal Problem:   Sepsis ([place]) Active Problems:   Multiple sclerosis diagnosed 2002-not on Therapy any longer   Acute encephalopathy   DVT, lower extremity ([place])   Diabetes mellitus type 2, controlled ([place])   Anemia of chronic disease   UTI (lower urinary tract infection)   Hypernatremia   1. Sepsis from UTI - patient was tachycardic and febrile with leukocytosis and source of infection UA compatible with sepsis criteria. Patient has been placed on cefepime for healthcare associated UTI for sepsis. Continue with hydration. Follow blood cultures urine cultures. Follow lactic acid levels. 2. Acute encephalopathy from  sepsis - closely monitor mental status. CT head is pending. If does not improve the need MRI brain. 3. History of DVT of the lower extremity on Xarelto - if patient does not pass swallow then will need heparin infusion. 4. Diabetes mellitus type 2 - will keep patient on standing scale coverage and hold metformin while inpatient. 5. History of multiple sclerosis presently on no medications. 6. Decubitus ulcers - will get wound team consult. 7. Renal failure at this time think it is chronic stasis 3 - hold Lasix for now. Continue hydration and follow metabolic panel. 8. Hypernatremia - probably from dehydration. I have placed patient on half-normal sling. Follow metabolic panel.  This is patient's second admission within last 2 months. We'll consult palliative care.   DVT Prophylaxis Xarelto which will need to be on heparin if patient fails swallow.  Code Status: Full code.  Family Communication: Unable to reach family.  Disposition Plan: Admit to inpatient.    Mitchael Luckey N. [facility] Pager [phone].  If 7PM-7AM, please contact night-coverage [email or web address] Password [facility] 08/31/2015, 6:41 AM

## 2015-08-31 NOTE — Progress Notes (Signed)
NURSING PROGRESS NOTE  [name] [id number] Admission Data: 08/31/2015 6:56 AM Attending Provider: Reyne Dumas, MD [id number], Brayton Layman, DO Code Status: FULL Allergies:  Review of patient's allergies indicates no known allergies. Past Medical History:   has a past medical history of Diabetes mellitus without complication ([place]); Multiple sclerosis diagnosed 2002-not on Therapy any longer (06/26/2012); Stage 4 decubitus ulcer ([place]) (07/05/2015); Acute renal failure superimposed on stage 3 chronic kidney disease ([place]) (07/21/2015); and Malnutrition of moderate degree (07/21/2015). Past Surgical History:   has past surgical history that includes Hernia mesh removal; tubes tided; Tubal ligation; and Esophagogastroduodenoscopy (Left, 01/02/2015). Social History:   reports that she quit smoking about 9 months ago. Her smoking use included Cigarettes. She has a 20 pack-year smoking history. She has never used smokeless tobacco. She reports that she drinks about 3.0 oz of alcohol per week. She reports that she does not use illicit drugs.  [name] is a 55 y.o. female patient admitted from ED:   Last Documented Vital Signs: Blood pressure 111/62, pulse 72, temperature 98 F (36.7 C), temperature source Rectal, resp. rate 18, height 5\' 6"  (1.676 m), weight 56.246 kg (124 lb), SpO2 100 %.  Cardiac Monitoring: .  IV Fluids:  IV in place, occlusive dsg intact without redness, IV cath wrist left, condition patent and no redness none. IV cath hand right, condition patent and no redness.  Skin: Blackened feet bilaterally, skin tear on left bottom buttock   Admission inpatient armband information verified with patient to include name and date of birth and placed on patient arm. Side rails up x 2, fall assessment and education completed with patient.  Call light within reach. Patient very confused and unable to verbalize understanding of unit orientation and safety instructions.  Will continue to evaluate  and treat per MD orders.  Clydell Hakim RN, BSN

## 2015-08-31 NOTE — Progress Notes (Signed)
Utilization review completed. Heike Pounds, RN, BSN. 

## 2015-08-31 NOTE — Evaluation (Signed)
Clinical/Bedside Swallow Evaluation Patient Details  Name: [name] MRN: [id number] Date of Birth: 10-01-1960  Today's Date: 08/31/2015 Time: SLP Start Time (ACUTE ONLY): [id number] SLP Stop Time (ACUTE ONLY): 1500 SLP Time Calculation (min) (ACUTE ONLY): 16 min  Past Medical History:  Past Medical History  Diagnosis Date  . Diabetes mellitus without complication ([place])   . Multiple sclerosis diagnosed 2002-not on Therapy any longer 06/26/2012  . Stage 4 decubitus ulcer ([place]) 07/05/2015  . Acute renal failure superimposed on stage 3 chronic kidney disease ([place]) 07/21/2015  . Malnutrition of moderate degree 07/21/2015  . UTI (lower urinary tract infection)    Past Surgical History:  Past Surgical History  Procedure Laterality Date  . Hernia mesh removal    . Tubes tided    . Tubal ligation    . Esophagogastroduodenoscopy Left 01/02/2015    Procedure: ESOPHAGOGASTRODUODENOSCOPY (EGD);  Surgeon: Arta Silence, MD;  Location: [facility];  Service: Endoscopy;  Laterality: Left;   HPI:  55 y.o. female with history of multiple sclerosis was brought from the nursing home after patient was found to have increasing confusion. Diagnosed with sepsis from UTI.    Assessment / Plan / Recommendation Clinical Impression  Patient presents with a normal oropharyngeal swallow. No overt indication of aspiration observed. No SLP f/u indicated.     Aspiration Risk  No limitations    Diet Recommendation Regular;Thin liquid   Liquid Administration via: Cup;Straw Medication Administration: Whole meds with liquid Supervision: Patient able to self feed Compensations: Slow rate;Small sips/bites Postural Changes: Seated upright at 90 degrees    Other  Recommendations Oral Care Recommendations: Oral care BID   Follow up Recommendations  None               Swallow Study   General HPI: 55 y.o. female with history of multiple sclerosis was brought from the nursing home after patient was found to have  increasing confusion. Diagnosed with sepsis from UTI.  Type of Study: Bedside Swallow Evaluation Previous Swallow Assessment: seen at bedside during admission 2/17, placed on pureed solids initially due to AMS.  Diet Prior to this Study: NPO Temperature Spikes Noted: No Respiratory Status: Room air History of Recent Intubation: No Behavior/Cognition: Alert;Cooperative;Pleasant mood Oral Cavity Assessment: Within Functional Limits Oral Care Completed by SLP: No Oral Cavity - Dentition: Poor condition;Missing dentition Vision: Functional for self-feeding Self-Feeding Abilities: Able to feed self Patient Positioning: Upright in bed Baseline Vocal Quality: Normal Volitional Swallow: Able to elicit    Oral/Motor/Sensory Function Overall Oral Motor/Sensory Function: Within functional limits   Ice Chips Ice chips: Not tested   Thin Liquid Thin Liquid: Within functional limits Presentation: Straw;Self Fed    Nectar Thick Nectar Thick Liquid: Not tested   Honey Thick Honey Thick Liquid: Not tested   Puree Puree: Within functional limits Presentation: Spoon   Solid   GO  Lilymarie Scroggins MA, CCC-SLP [phone]  Solid: Within functional limits Presentation: [place] 08/31/2015,3:04 PM

## 2015-08-31 NOTE — Progress Notes (Signed)
CRITICAL VALUE ALERT  Critical value received:  Lactic Acid 2.6  Date of notification:  08/31/15  Time of notification:  [id number]  Critical value read back:Yes.    Nurse who received alert:  Fabian Sharp  MD notified (1st page):  Dr. Allyson Sabal  Time of first page:  1335  MD notified (2nd page):  Time of second page:  Responding MD:  Allyson Sabal  Time MD responded:  [phone]

## 2015-08-31 NOTE — Progress Notes (Signed)
RN paged speech x2 at 1031 and 1114, no one has called RN back. Physical Therapist gave RN another number to call. RN spoke with Abigail Butts whom informed RN that she will send a text page to Speech therapist. Dorita Fray 08/31/2015 12:35 PM

## 2015-08-31 NOTE — Progress Notes (Signed)
Pharmacy Antibiotic Note  [name] is a 55 y.o. female admitted on 08/30/2015 with AMS/ UTI.  Pharmacy has been consulted for Cefepime dosing.  Plan: Cefepime 1 g IV q12h  Height: 5\' 3"  (160 cm) Weight: 127 lb (57.607 kg) IBW/kg (Calculated) : 52.4  Temp (24hrs), Avg:99.9 F (37.7 C), Min:99.9 F (37.7 C), Max:99.9 F (37.7 C)   Recent Labs Lab 08/31/15 0030 08/31/15 0042 08/31/15 0429  WBC 16.8*  --   --   CREATININE 1.39*  --   --   LATICACIDVEN  --  2.18* 1.51    Estimated Creatinine Clearance: 37.4 mL/min (by C-G formula based on Cr of 1.39).    No Known Allergies  Antimicrobials this admission: Cefepime 3/20 >>  Dose adjustments this admission:  Microbiology results:   [name] 08/31/2015 6:45 AM

## 2015-08-31 NOTE — Consult Note (Signed)
WOC wound consult note Reason for Consult: Consult requested for heels and sacrum. Pt is familiar to [facility] team from recent admission, refer to progress notes on 2/13.  Pt previously had pressure injuries noted to bilat ischium and heels, which have healed at this time. Wound type: Bilat ischium, sacrum, and heels have paler-colored intact dry scar tissue.  There is a partial thickness abrasion on the lower buttock, .5X.5X.1cm, pink and moist. Dry peeling skin surrounding the location of the previous wounds to the heels.  No topical treatment indicated at this time.  Float heels to reduce pressure, air mattress to reduce pressure, and barrier cream to repel moisture and protect skin. Please re-consult if further assistance is needed.  Thank-you,  Julien Girt MSN, [place], [place], [place], [place]

## 2015-09-01 LAB — CBC
HEMATOCRIT: 25.7 % — AB (ref 36.0–46.0)
HEMOGLOBIN: 8 g/dL — AB (ref 12.0–15.0)
MCH: 26.8 pg (ref 26.0–34.0)
MCHC: 31.1 g/dL (ref 30.0–36.0)
MCV: 86.2 fL (ref 78.0–100.0)
Platelets: 437 10*3/uL — ABNORMAL HIGH (ref 150–400)
RBC: 2.98 MIL/uL — ABNORMAL LOW (ref 3.87–5.11)
RDW: 18.8 % — ABNORMAL HIGH (ref 11.5–15.5)
WBC: 12.3 10*3/uL — AB (ref 4.0–10.5)

## 2015-09-01 LAB — COMPREHENSIVE METABOLIC PANEL
ALBUMIN: 2.4 g/dL — AB (ref 3.5–5.0)
ALK PHOS: 52 U/L (ref 38–126)
ALT: 13 U/L — ABNORMAL LOW (ref 14–54)
AST: 14 U/L — AB (ref 15–41)
Anion gap: 6 (ref 5–15)
BILIRUBIN TOTAL: 0.5 mg/dL (ref 0.3–1.2)
BUN: 35 mg/dL — AB (ref 6–20)
CALCIUM: 9.8 mg/dL (ref 8.9–10.3)
CO2: 24 mmol/L (ref 22–32)
Chloride: 112 mmol/L — ABNORMAL HIGH (ref 101–111)
Creatinine, Ser: 0.76 mg/dL (ref 0.44–1.00)
GFR calc Af Amer: >60 mL/min (ref >60)
GFR calc non Af Amer: >60 mL/min (ref >60)
GLUCOSE: 142 mg/dL — AB (ref 65–99)
Potassium: 3.3 mmol/L — ABNORMAL LOW (ref 3.5–5.1)
Sodium: 142 mmol/L (ref 135–145)
TOTAL PROTEIN: 6.7 g/dL (ref 6.5–8.1)

## 2015-09-01 LAB — GLUCOSE, CAPILLARY
Glucose-Capillary: 135 mg/dL — ABNORMAL HIGH (ref 65–99)
Glucose-Capillary: 268 mg/dL — ABNORMAL HIGH (ref 65–99)

## 2015-09-01 MED ORDER — CEPHALEXIN 500 MG PO CAPS: 500.0000 mg | ORAL_CAPSULE | Freq: Four times a day (QID) | ORAL | Status: DC

## 2015-09-01 MED ORDER — HYDROCODONE-ACETAMINOPHEN 5-325 MG PO TABS: 1.0000 | ORAL_TABLET | Freq: Four times a day (QID) | ORAL | Status: DC | PRN

## 2015-09-01 MED ORDER — POTASSIUM CHLORIDE CRYS ER 20 MEQ PO TBCR
40.0000 meq | EXTENDED_RELEASE_TABLET | Freq: Once | ORAL | Status: AC
  Administered 2015-09-01: 40 meq via ORAL

## 2015-09-01 MED ORDER — POTASSIUM CHLORIDE CRYS ER 20 MEQ PO TBCR: 40.0000 meq | EXTENDED_RELEASE_TABLET | Freq: Once | ORAL | Status: DC

## 2015-09-01 NOTE — Discharge Summary (Signed)
Physician Discharge Summary  [name] MRN: [id number] DOB/AGE: Sep 29, 1960 55 y.o.  PCP: Gildardo Cranker, DO   Admit date: 08/30/2015 Discharge date: 09/01/2015  Discharge Diagnoses:     Principal Problem:   Sepsis [facility]) Active Problems:   Multiple sclerosis diagnosed 2002-not on Therapy any longer   Acute encephalopathy   DVT, lower extremity ([place])   Diabetes mellitus type 2, controlled ([place])   Anemia of chronic disease   UTI (lower urinary tract infection)   Hypernatremia    Follow-up recommendations Follow-up with PCP in 3-5 days , including all  additional recommended appointments as below Follow-up CBC, CMP in 3-5 days Patient continue with antibiotics for another 7 days, until 3/28 Please assist patient for all 3 meals, she should have adequate access to fluids and water to avoid dehydration     Medication List    TAKE these medications        albuterol 108 (90 Base) MCG/ACT inhaler  Commonly known as:  PROVENTIL HFA;VENTOLIN HFA  Inhale 2 puffs into the lungs every 4 (four) hours as needed for wheezing or shortness of breath.     AMBULATORY NON FORMULARY MEDICATION  Med Pass Offer 120 ml four times daily for weight loss.     APAP 325 MG tablet  Take 650 mg by mouth every 6 (six) hours as needed for pain.     baclofen 10 MG tablet  Commonly known as:  LIORESAL  Take 10 mg by mouth 3 (three) times daily.     bisacodyl 10 MG suppository  Commonly known as:  DULCOLAX  Place 10 mg rectally daily as needed for moderate constipation (only give 1 dose in 24 hours).     cephALEXin 500 MG capsule  Commonly known as:  KEFLEX  Take 1 capsule (500 mg total) by mouth 4 (four) times daily.     famotidine 20 MG tablet  Commonly known as:  PEPCID  Take 1 tablet (20 mg total) by mouth 2 (two) times daily.     feeding supplement (PRO-STAT SUGAR FREE 64) Liqd  Take 30 mLs by mouth 2 (two) times daily.     furosemide 40 MG tablet  Commonly known as:  LASIX  Take  1 tablet (40 mg total) by mouth daily as needed for fluid or edema. Reported on 07/05/2015     gabapentin 100 MG capsule  Commonly known as:  NEURONTIN  Take 100 mg by mouth at bedtime.     HYDROcodone-acetaminophen 5-325 MG tablet  Commonly known as:  NORCO  Take 1 tablet by mouth every 6 (six) hours as needed for moderate pain.     lovastatin 20 MG tablet  Commonly known as:  MEVACOR  Take 20 mg by mouth daily.     metFORMIN 500 MG tablet  Commonly known as:  GLUCOPHAGE  Take 500 mg by mouth 2 (two) times daily.     MILK OF MAGNESIA PO  Take 30 mLs by mouth daily as needed.     multivitamin with minerals Tabs tablet  Take 1 tablet by mouth daily.     Omega 3 1000 MG Caps  Take 1,000 mg by mouth daily.     RA SALINE ENEMA RE  Place 1 application rectally daily as needed (severe constipation, do not exceed 1 dose in 24 hours, if still no relief contact MD).     rivaroxaban 20 MG Tabs tablet  Commonly known as:  XARELTO  Take 20 mg by mouth daily with supper.  Reported on 08/12/2015     solifenacin 5 MG tablet  Commonly known as:  VESICARE  Take 5 mg by mouth daily.     vitamin C 500 MG tablet  Commonly known as:  ASCORBIC ACID  Take 500 mg by mouth daily.         Discharge Condition: Stable   Discharge Instructions Get Medicines reviewed and adjusted: Please take all your medications with you for your next visit with your Primary MD  Please request your Primary MD to go over all hospital tests and procedure/radiological results at the follow up, please ask your Primary MD to get all Hospital records sent to his/her office.  If you experience worsening of your admission symptoms, develop shortness of breath, life threatening emergency, suicidal or homicidal thoughts you must seek medical attention immediately by calling 911 or calling your MD immediately if symptoms less severe.  You must read complete instructions/literature along with all the possible adverse  reactions/side effects for all the Medicines you take and that have been prescribed to you. Take any new Medicines after you have completely understood and accpet all the possible adverse reactions/side effects.   Do not drive when taking Pain medications.   Do not take more than prescribed Pain, Sleep and Anxiety Medications  Special Instructions: If you have smoked or chewed Tobacco in the last 2 yrs please stop smoking, stop any regular Alcohol and or any Recreational drug use.  Wear Seat belts while driving.  Please note  You were cared for by a hospitalist during your hospital stay. Once you are discharged, your primary care physician will handle any further medical issues. Please note that NO REFILLS for any discharge medications will be authorized once you are discharged, as it is imperative that you return to your primary care physician (or establish a relationship with a primary care physician if you do not have one) for your aftercare needs so that they can reassess your need for medications and monitor your lab values.      Discharge Instructions    Diet - low sodium heart healthy    Complete by:  As directed      Increase activity slowly    Complete by:  As directed            No Known Allergies    Disposition: 03-Skilled Nursing Facility   Consults:      Significant Diagnostic Studies:  Ct Head Wo Contrast  08/31/2015  CLINICAL DATA:  Sepsis and acute encephalopathy. EXAM: CT HEAD WITHOUT CONTRAST TECHNIQUE: Contiguous axial images were obtained from the base of the skull through the vertex without intravenous contrast. COMPARISON:  07/20/2015 FINDINGS: Moderate to advanced atrophy is stable. Negative for hydrocephalus. Chronic white matter changes are stable. Negative for acute infarct. Negative for acute hemorrhage or mass. No edema or shift of the midline structures. Calvarium intact. IMPRESSION: Atrophy and chronic white matter changes are stable. No  superimposed acute abnormality Electronically Signed   By: Franchot Gallo M.D.   On: 08/31/2015 07:38   Dg Chest Port 1 View  08/31/2015  CLINICAL DATA:  Confusion. EXAM: PORTABLE CHEST 1 VIEW COMPARISON:  07/20/2015 FINDINGS: Shallow inspiration. Normal heart size and pulmonary vascularity. No focal airspace disease or consolidation in the lungs. No blunting of costophrenic angles. No pneumothorax. Mediastinal contours appear intact. IMPRESSION: No active disease. Electronically Signed   By: Lucienne Capers M.D.   On: 08/31/2015 01:15        [employer]  08/30/15 2317 08/31/15 0647  Weight: 57.607 kg (127 lb) 56.246 kg (124 lb)     Microbiology: Recent Results (from the past 240 hour(s))  MRSA PCR Screening     Status: None   Collection Time: 08/31/15  6:17 AM  Result Value Ref Range Status   MRSA by PCR NEGATIVE NEGATIVE Final    Comment:        The GeneXpert MRSA Assay (FDA approved for NASAL specimens only), is one component of a comprehensive MRSA colonization surveillance program. It is not intended to diagnose MRSA infection nor to guide or monitor treatment for MRSA infections.        Blood Culture    Component Value Date/Time   SDES URINE, CATHETERIZED 07/20/2015 1933   SPECREQUEST NONE 07/20/2015 1933   CULT  07/20/2015 1933    NO GROWTH 1 DAY Performed at [facility]    REPTSTATUS 07/21/2015 FINAL 07/20/2015 1933      Labs: Results for orders placed or performed during the hospital encounter of 08/30/15 (from the past 48 hour(s))  Urinalysis, Routine w reflex microscopic (not at [facility])     Status: Abnormal   Collection Time: 08/30/15 11:58 PM  Result Value Ref Range   Color, Urine YELLOW YELLOW   APPearance TURBID (A) CLEAR   Specific Gravity, Urine 1.014 1.005 - 1.030   pH 8.0 5.0 - 8.0   Glucose, UA NEGATIVE NEGATIVE mg/dL   Hgb urine dipstick LARGE (A) NEGATIVE   Bilirubin Urine NEGATIVE NEGATIVE   Ketones, ur NEGATIVE NEGATIVE  mg/dL   Protein, ur 100 (A) NEGATIVE mg/dL   Nitrite POSITIVE (A) NEGATIVE   Leukocytes, UA LARGE (A) NEGATIVE  Urine microscopic-add on     Status: Abnormal   Collection Time: 08/30/15 11:58 PM  Result Value Ref Range   Squamous Epithelial / LPF 0-5 (A) NONE SEEN   WBC, UA TOO NUMEROUS TO COUNT 0 - 5 WBC/hpf   RBC / HPF 0-5 0 - 5 RBC/hpf   Bacteria, UA MANY (A) NONE SEEN   Crystals TRIPLE PHOSPHATE CRYSTALS (A) NEGATIVE  Comprehensive metabolic panel     Status: Abnormal   Collection Time: 08/31/15 12:30 AM  Result Value Ref Range   Sodium 149 (H) 135 - 145 mmol/L   Potassium 4.1 3.5 - 5.1 mmol/L   Chloride 109 101 - 111 mmol/L   CO2 26 22 - 32 mmol/L   Glucose, Bld 161 (H) 65 - 99 mg/dL   BUN 70 (H) 6 - 20 mg/dL   Creatinine, Ser 1.39 (H) 0.44 - 1.00 mg/dL   Calcium 10.8 (H) 8.9 - 10.3 mg/dL   Total Protein 8.8 (H) 6.5 - 8.1 g/dL   Albumin 3.2 (L) 3.5 - 5.0 g/dL   AST 19 15 - 41 U/L   ALT 15 14 - 54 U/L   Alkaline Phosphatase 66 38 - 126 U/L   Total Bilirubin 0.6 0.3 - 1.2 mg/dL   GFR calc non Af Amer 41 (L) >60 mL/min   GFR calc Af Amer 48 (L) >60 mL/min    Comment: (NOTE) The eGFR has been calculated using the CKD EPI equation. This calculation has not been validated in all clinical situations. eGFR's persistently <60 mL/min signify possible Chronic Kidney Disease.    Anion gap 14 5 - 15  CBC with Differential     Status: Abnormal   Collection Time: 08/31/15 12:30 AM  Result Value Ref Range   WBC 16.8 (H) 4.0 - 10.5 K/uL  RBC 4.62 3.87 - 5.11 MIL/uL   Hemoglobin 13.0 12.0 - 15.0 g/dL   HCT 39.7 36.0 - 46.0 %   MCV 85.9 78.0 - 100.0 fL   MCH 28.1 26.0 - 34.0 pg   MCHC 32.7 30.0 - 36.0 g/dL   RDW 19.2 (H) 11.5 - 15.5 %   Platelets 384 150 - 400 K/uL   Neutrophils Relative % 77 %   Neutro Abs 13.0 (H) 1.7 - 7.7 K/uL   Lymphocytes Relative 15 %   Lymphs Abs 2.5 0.7 - 4.0 K/uL   Monocytes Relative 7 %   Monocytes Absolute 1.3 (H) 0.1 - 1.0 K/uL   Eosinophils  Relative 1 %   Eosinophils Absolute 0.1 0.0 - 0.7 K/uL   Basophils Relative 0 %   Basophils Absolute 0.1 0.0 - 0.1 K/uL  I-Stat CG4 Lactic Acid, ED  (not at [facility])     Status: Abnormal   Collection Time: 08/31/15 12:42 AM  Result Value Ref Range   Lactic Acid, Venous 2.18 (HH) 0.5 - 2.0 mmol/L   Comment NOTIFIED PHYSICIAN   I-Stat CG4 Lactic Acid, ED     Status: None   Collection Time: 08/31/15  4:29 AM  Result Value Ref Range   Lactic Acid, Venous 1.51 0.5 - 2.0 mmol/L  MRSA PCR Screening     Status: None   Collection Time: 08/31/15  6:17 AM  Result Value Ref Range   MRSA by PCR NEGATIVE NEGATIVE    Comment:        The GeneXpert MRSA Assay (FDA approved for NASAL specimens only), is one component of a comprehensive MRSA colonization surveillance program. It is not intended to diagnose MRSA infection nor to guide or monitor treatment for MRSA infections.   Glucose, capillary     Status: Abnormal   Collection Time: 08/31/15  8:02 AM  Result Value Ref Range   Glucose-Capillary 114 (H) 65 - 99 mg/dL  CBC with Differential     Status: Abnormal   Collection Time: 08/31/15  9:32 AM  Result Value Ref Range   WBC 16.5 (H) 4.0 - 10.5 K/uL   RBC 3.50 (L) 3.87 - 5.11 MIL/uL   Hemoglobin 9.5 (L) 12.0 - 15.0 g/dL    Comment: REPEATED TO VERIFY   HCT 30.0 (L) 36.0 - 46.0 %   MCV 85.7 78.0 - 100.0 fL   MCH 27.1 26.0 - 34.0 pg   MCHC 31.7 30.0 - 36.0 g/dL   RDW 19.0 (H) 11.5 - 15.5 %   Platelets 505 (H) 150 - 400 K/uL    Comment: PLATELET COUNT CONFIRMED BY SMEAR   Neutrophils Relative % 75 %   Lymphocytes Relative 16 %   Monocytes Relative 8 %   Eosinophils Relative 1 %   Basophils Relative 0 %   Neutro Abs 12.4 (H) 1.7 - 7.7 K/uL   Lymphs Abs 2.6 0.7 - 4.0 K/uL   Monocytes Absolute 1.3 (H) 0.1 - 1.0 K/uL   Eosinophils Absolute 0.2 0.0 - 0.7 K/uL   Basophils Absolute 0.0 0.0 - 0.1 K/uL   RBC Morphology POLYCHROMASIA PRESENT    WBC Morphology ATYPICAL LYMPHOCYTES    Comprehensive metabolic panel     Status: Abnormal   Collection Time: 08/31/15  9:32 AM  Result Value Ref Range   Sodium 152 (H) 135 - 145 mmol/L   Potassium 3.7 3.5 - 5.1 mmol/L   Chloride 115 (H) 101 - 111 mmol/L   CO2 26 22 - 32 mmol/L  Glucose, Bld 132 (H) 65 - 99 mg/dL   BUN 61 (H) 6 - 20 mg/dL   Creatinine, Ser 1.21 (H) 0.44 - 1.00 mg/dL   Calcium 10.4 (H) 8.9 - 10.3 mg/dL   Total Protein 8.3 (H) 6.5 - 8.1 g/dL   Albumin 2.9 (L) 3.5 - 5.0 g/dL   AST 16 15 - 41 U/L   ALT 13 (L) 14 - 54 U/L   Alkaline Phosphatase 59 38 - 126 U/L   Total Bilirubin 0.6 0.3 - 1.2 mg/dL   GFR calc non Af Amer 49 (L) >60 mL/min   GFR calc Af Amer 57 (L) >60 mL/min    Comment: (NOTE) The eGFR has been calculated using the CKD EPI equation. This calculation has not been validated in all clinical situations. eGFR's persistently <60 mL/min signify possible Chronic Kidney Disease.    Anion gap 11 5 - 15  Lactic acid, plasma     Status: Abnormal   Collection Time: 08/31/15  9:32 AM  Result Value Ref Range   Lactic Acid, Venous 3.0 (HH) 0.5 - 2.0 mmol/L    Comment: CRITICAL RESULT CALLED TO, READ BACK BY AND VERIFIED WITH: T.DUONG,RN 1034 08/31/15 CLARK,S   Procalcitonin     Status: None   Collection Time: 08/31/15  9:32 AM  Result Value Ref Range   Procalcitonin 5.50 ng/mL    Comment:        Interpretation: PCT > 2 ng/mL: Systemic infection (sepsis) is likely, unless other causes are known. (NOTE)         ICU PCT Algorithm               Non ICU PCT Algorithm    ----------------------------     ------------------------------         PCT < 0.25 ng/mL                 PCT < 0.1 ng/mL     Stopping of antibiotics            Stopping of antibiotics       strongly encouraged.               strongly encouraged.    ----------------------------     ------------------------------       PCT level decrease by               PCT < 0.25 ng/mL       >= 80% from peak PCT       OR PCT 0.25 - 0.5 ng/mL           Stopping of antibiotics                                             encouraged.     Stopping of antibiotics           encouraged.    ----------------------------     ------------------------------       PCT level decrease by              PCT >= 0.25 ng/mL       < 80% from peak PCT        AND PCT >= 0.5 ng/mL            Continuing antibiotics  encouraged.       Continuing antibiotics            encouraged.    ----------------------------     ------------------------------     PCT level increase compared          PCT > 0.5 ng/mL         with peak PCT AND          PCT >= 0.5 ng/mL             Escalation of antibiotics                                          strongly encouraged.      Escalation of antibiotics        strongly encouraged.   Lactic acid, plasma     Status: Abnormal   Collection Time: 08/31/15 12:11 PM  Result Value Ref Range   Lactic Acid, Venous 2.6 (HH) 0.5 - 2.0 mmol/L    Comment: CRITICAL RESULT CALLED TO, READ BACK BY AND VERIFIED WITH: J.EDWARDS,RN 1334 08/31/15 CLARK,S   Glucose, capillary     Status: Abnormal   Collection Time: 08/31/15 12:16 PM  Result Value Ref Range   Glucose-Capillary 122 (H) 65 - 99 mg/dL   Comment 1 Notify RN   Glucose, capillary     Status: Abnormal   Collection Time: 08/31/15  5:01 PM  Result Value Ref Range   Glucose-Capillary 114 (H) 65 - 99 mg/dL  Glucose, capillary     Status: Abnormal   Collection Time: 08/31/15  8:52 PM  Result Value Ref Range   Glucose-Capillary 142 (H) 65 - 99 mg/dL  CBC     Status: Abnormal   Collection Time: 09/01/15  4:15 AM  Result Value Ref Range   WBC 12.3 (H) 4.0 - 10.5 K/uL   RBC 2.98 (L) 3.87 - 5.11 MIL/uL   Hemoglobin 8.0 (L) 12.0 - 15.0 g/dL   HCT 25.7 (L) 36.0 - 46.0 %   MCV 86.2 78.0 - 100.0 fL   MCH 26.8 26.0 - 34.0 pg   MCHC 31.1 30.0 - 36.0 g/dL   RDW 18.8 (H) 11.5 - 15.5 %   Platelets 437 (H) 150 - 400 K/uL  Comprehensive metabolic  panel     Status: Abnormal   Collection Time: 09/01/15  4:15 AM  Result Value Ref Range   Sodium 142 135 - 145 mmol/L    Comment: DELTA CHECK NOTED   Potassium 3.3 (L) 3.5 - 5.1 mmol/L   Chloride 112 (H) 101 - 111 mmol/L   CO2 24 22 - 32 mmol/L   Glucose, Bld 142 (H) 65 - 99 mg/dL   BUN 35 (H) 6 - 20 mg/dL   Creatinine, Ser 0.76 0.44 - 1.00 mg/dL   Calcium 9.8 8.9 - 10.3 mg/dL   Total Protein 6.7 6.5 - 8.1 g/dL   Albumin 2.4 (L) 3.5 - 5.0 g/dL   AST 14 (L) 15 - 41 U/L   ALT 13 (L) 14 - 54 U/L   Alkaline Phosphatase 52 38 - 126 U/L   Total Bilirubin 0.5 0.3 - 1.2 mg/dL   GFR calc non Af Amer >60 >60 mL/min   GFR calc Af Amer >60 >60 mL/min    Comment: (NOTE) The eGFR has been calculated using the CKD EPI equation. This calculation has not been validated in all clinical  situations. eGFR's persistently <60 mL/min signify possible Chronic Kidney Disease.    Anion gap 6 5 - 15  Glucose, capillary     Status: Abnormal   Collection Time: 09/01/15  7:52 AM  Result Value Ref Range   Glucose-Capillary 135 (H) 65 - 99 mg/dL     Lipid Panel     Component Value Date/Time   CHOL 143 07/13/2015   TRIG 71 07/13/2015   HDL 41 07/13/2015   LDLCALC 88 07/13/2015     Lab Results  Component Value Date   HGBA1C 5.8* 07/20/2015   HGBA1C 7.4* 06/26/2012   HGBA1C 6.8* 03/03/2011     Lab Results  Component Value Date   LDLCALC 88 07/13/2015   CREATININE 0.76 09/01/2015     55 y.o. female with history of multiple sclerosis was brought from the nursing home after patient was found to have increasing confusion. Patient is completely confused and does not provide any history at this time and I am unable to reach family for further history. Most of the history is obtained from the ER PA and records. Patient was found to be confused and brought to the ER and UA shows features consistent with UTI. Patient is mildly febrile. Lactic acid was initially elevated which improved with fluid bolus.  On my exam patient only repeats what I say and only moves her left upper extremity. Baseline not known at this time. Patient had similar picture of presentation in February 2017. Patient is being admitted for sepsis from UTI with acute encephalopathy.   Assessment and plan  Sepsis from UTI - patient was tachycardic and febrile with leukocytosis and source of infection UA compatible with sepsis criteria. Urine cultures/blood culture no growth so far, Patient has been placed on cefepime for healthcare associated UTI for sepsis.  Now switched to Keflex based on previous urine culture that was positive for Proteus sensitive to Rocephin. Will continue Keflex for another week.  Acute encephalopathy from sepsis - closely monitor mental status. CT head is negative . Improved  History of DVT of the lower extremity on Xarelto - resume  Diabetes mellitus type 2 - will keep patient on standing scale coverage and hold metformin while inpatient.A1c:5.8  History of multiple sclerosis Stable, continue on Neurontin, baclofen when mental status clears up. Pt with neurogenic bladder and has chronic foley.  Decubitus ulcers - Dry peeling skin surrounding the location of the previous wounds to the heels. No topical treatment indicated at this time. Float heels to reduce pressure, air mattress to reduce pressure, and barrier cream to repel moisture and protect skin  Acute kidney injury resolved after IV fluids and holding Lasix -continue to follow renal panel  Hypernatremia - probably from dehydration. Improved with IV fluids  Decubitus ulcer, stage 3 (HCC) -improving, recently treated with cipro from what appears to be previous hospitalization due to worsening ulcer.      Discharge Exam   Blood pressure 116/64, pulse 73, temperature 98.2 F (36.8 C), temperature source Oral, resp. rate 18, height _0  (1.676 m), weight 56.246 kg (124 lb), SpO2 99 %.   General: Moderately built and nourished.  Eyes:  Anicteric no pallor.  ENT: No discharge from the ears eyes nose or mouth.  Neck: No mass felt.  Cardiovascular: S1 and S2 heard.  Respiratory: No rhonchi or crepitations.  Abdomen: Soft nontender bowel sounds present.  Skin: Decubitus ulcer in the sacrum and heels.  Musculoskeletal: No edema.  Psychiatric: Appears confused.  Neurologic: Appears confused.  Only moves left upper extremity.    Follow-up Information    Follow up with Gildardo Cranker, DO. Schedule an appointment as soon as possible for a visit in 3 days.   Specialty:  Internal Medicine   Contact information:   [place] [id number] [phone]       Signed: Reyne Dumas 09/01/2015, 10:57 AM        Time spent >45 mins

## 2015-09-01 NOTE — Progress Notes (Signed)
D/c'd to SNF via [facility] via stretcher  Voices no c/o

## 2015-09-01 NOTE — Care Management Note (Signed)
Case Management Note  Patient Details  Name: [name] MRN: [id number] Date of Birth: 03-19-1961  Subjective/Objective:                 Patient admitted from [facility] for Sepsis.   Action/Plan:  Will DC to SNF today.  Expected Discharge Date:                  Expected Discharge Plan:  Skilled Nursing Facility  In-House Referral:  Clinical Social Work  Discharge planning Services  CM Consult  Post Acute Care Choice:    Choice offered to:     DME Arranged:    DME Agency:     HH Arranged:    [place] Agency:     Status of Service:  Completed, signed off  Medicare Important Message Given:    Date Medicare IM Given:    Medicare IM give by:    Date Additional Medicare IM Given:    Additional Medicare Important Message give by:     If discussed at [place] of Stay Meetings, dates discussed:    Additional Comments:  Carles Collet, RN 09/01/2015, 12:26 PM

## 2015-09-01 NOTE — Clinical Social Work Note (Addendum)
Clinical Social Work Assessment  Patient Details  Name: [name] MRN: [id number] Date of Birth: July 23, 1960  Date of referral:  09/01/15               Reason for consult:  Facility Placement                Permission sought to share information with:  Chartered certified accountant granted to share information::  Yes, Verbal Permission Granted  Name::        Agency::     Relationship::     Contact Information:     Housing/Transportation Living arrangements for the past 2 months:  Single Family Home Source of Information:  Patient Patient Interpreter Needed:  None Criminal Activity/Legal Involvement Pertinent to Current Situation/Hospitalization:  No - Comment as needed Significant Relationships:  Significant Other (Ex BF) Lives with:  Significant Other (Ex BF) Do you feel safe going back to the place where you live?  Yes Need for family participation in patient care:  No (Coment)  Care giving concerns:  PT recommend SNF   Social Worker assessment / plan:  BSW intern enters patient room, patient is alert and oriented with nurse at bedside. BSW intern explained PT recommendation and referral process. BSW intern ask patient was she willing to return to [facility] and the patient states she would like to look at other facilities. BSW intern told the patient she will sent her information out to other facilities and come back to present patient with new bed offers. Patient accepted and appreciated BSW intern help.   Employment status:  Disabled (Comment on whether or not currently receiving Disability) Insurance information:  Managed Medicare PT Recommendations:  [place] / Referral to community resources:  Acute Rehab  Patient/Family's Response to care:  Patient has good response to care.  Patient/Family's Understanding of and Emotional Response to Diagnosis, Current Treatment, and Prognosis:  Patient is aware of her current treatment and  planning. Patient is also very updated on patient diagnosis.   Emotional Assessment Appearance:  Appears stated age Attitude/Demeanor/Rapport:   (friendly, wecoming) Affect (typically observed):  Accepting, Calm, Hopeful Orientation:  Oriented to Self, Oriented to Place, Oriented to  Time, Oriented to Situation Alcohol / Substance use:  Never Used Psych involvement (Current and /or in the community):  No (Comment)  Discharge Needs  Concerns to be addressed:  No discharge needs identified Readmission within the last 30 days:  No Current discharge risk:  None Barriers to Discharge:  No Barriers Identified   [place] intern  [phone]  CSW has reviewed assessment and agrees with its findings  Domenica Reamer, [place] Worker [phone]

## 2015-09-01 NOTE — Progress Notes (Signed)
Palliative consult received on 3/20. Patient discharged on 3/21 before palliative consultation and goals of care meeting could be done while inpatient. Please consider making Palliative Care referral from SNF for ongoing goals of care- he is a high risk patient who has 3 admissions in the last 6 months and no legally recognized documentation of advance directives or preferences for treatment in the setting of serious chronic debility and illness. There is a SNF consent form on the chart for FULL CODE which is not recognized outside the facility nor does it identify a HCPOA or preferences for care should he decline.  Lane Hacker, DO Palliative Medicine [phone]

## 2015-09-01 NOTE — Progress Notes (Signed)
Patient will discharge to [facility] Anticipated discharge date: 3/21 Family notified: at bedside Transportation by [facility]- scheduled for 2pm  CSW signing off.  Domenica Reamer, [place] Social Worker [phone]

## 2015-09-01 NOTE — Progress Notes (Signed)
Report called to [facility] spoke to [employer] LPN

## 2015-09-02 LAB — URINE CULTURE

## 2015-09-04 ENCOUNTER — Encounter: Payer: Self-pay | Admitting: Adult Health

## 2015-09-04 ENCOUNTER — Non-Acute Institutional Stay (SKILLED_NURSING_FACILITY): Payer: Medicare Other | Admitting: Adult Health

## 2015-09-04 DIAGNOSIS — E1122 Type 2 diabetes mellitus with diabetic chronic kidney disease: Secondary | ICD-10-CM | POA: Diagnosis not present

## 2015-09-04 DIAGNOSIS — K219 Gastro-esophageal reflux disease without esophagitis: Secondary | ICD-10-CM | POA: Diagnosis not present

## 2015-09-04 DIAGNOSIS — R600 Localized edema: Secondary | ICD-10-CM | POA: Diagnosis not present

## 2015-09-04 DIAGNOSIS — E1169 Type 2 diabetes mellitus with other specified complication: Secondary | ICD-10-CM | POA: Diagnosis not present

## 2015-09-04 DIAGNOSIS — A419 Sepsis, unspecified organism: Secondary | ICD-10-CM

## 2015-09-04 DIAGNOSIS — G35 Multiple sclerosis: Secondary | ICD-10-CM | POA: Diagnosis not present

## 2015-09-04 DIAGNOSIS — N183 Chronic kidney disease, stage 3 (moderate): Secondary | ICD-10-CM

## 2015-09-04 DIAGNOSIS — E785 Hyperlipidemia, unspecified: Secondary | ICD-10-CM | POA: Diagnosis not present

## 2015-09-04 DIAGNOSIS — E876 Hypokalemia: Secondary | ICD-10-CM | POA: Insufficient documentation

## 2015-09-04 DIAGNOSIS — N390 Urinary tract infection, site not specified: Secondary | ICD-10-CM | POA: Diagnosis not present

## 2015-09-04 DIAGNOSIS — I82402 Acute embolism and thrombosis of unspecified deep veins of left lower extremity: Secondary | ICD-10-CM | POA: Diagnosis not present

## 2015-09-04 NOTE — Progress Notes (Signed)
Patient ID: [name], female   DOB: Jan 06, 1961, 55 y.o.   MRN: [id number]   Facility: [name]        No Known Allergies  Chief Complaint  Patient presents with  . Hospitalization Follow-up    Hospital Follow up    HPI:  She is a long term resident of this facility who has been hospitalized for sepsis related to proteus mirabilis uti. She is not voicing any complaints or concerns at this time. She states that she is feeling good. There are no nursing concerns at this time.    Past Medical History  Diagnosis Date  . Diabetes mellitus without complication ([place])   . Multiple sclerosis diagnosed 2002-not on Therapy any longer 06/26/2012  . Stage 4 decubitus ulcer ([place]) 07/05/2015  . Acute renal failure superimposed on stage 3 chronic kidney disease ([place]) 07/21/2015  . Malnutrition of moderate degree 07/21/2015  . UTI (lower urinary tract infection)     Past Surgical History  Procedure Laterality Date  . Hernia mesh removal    . Tubes tided    . Tubal ligation    . Esophagogastroduodenoscopy Left 01/02/2015    Procedure: ESOPHAGOGASTRODUODENOSCOPY (EGD);  Surgeon: Arta Silence, MD;  Location: [facility];  Service: Endoscopy;  Laterality: Left;    VITAL SIGNS BP 136/75 mmHg  Pulse 60  Temp(Src) 97 F (36.1 C) (Oral)  Resp 18  Ht 1' (0.305 m)  Wt 128 lb 6 oz (58.231 kg)  BMI 625.97 kg/m2  Patient's Medications  New Prescriptions   No medications on file  Previous Medications   ACETAMINOPHEN (APAP) 325 MG TABLET    Take 650 mg by mouth every 6 (six) hours as needed for pain.   ALBUTEROL (PROVENTIL HFA;VENTOLIN HFA) 108 (90 BASE) MCG/ACT INHALER    Inhale 2 puffs into the lungs every 4 (four) hours as needed for wheezing or shortness of breath.   AMBULATORY NON FORMULARY MEDICATION    Med Pass Offer 120 ml three times daily for weight loss.   AMINO ACIDS-PROTEIN HYDROLYS (FEEDING SUPPLEMENT, PRO-STAT SUGAR FREE 64,) LIQD    Take 30 mLs by mouth 2 (two) times daily.     BACLOFEN (LIORESAL) 10 MG TABLET    Take 10 mg by mouth 3 (three) times daily.    CEPHALEXIN (KEFLEX) 500 MG CAPSULE    Take 1 capsule (500 mg total) by mouth 4 (four) times daily.   FAMOTIDINE (PEPCID) 20 MG TABLET    Take 1 tablet (20 mg total) by mouth 2 (two) times daily.   FUROSEMIDE (LASIX) 40 MG TABLET    Take 1 tablet (40 mg total) by mouth daily as needed for fluid or edema. Reported on 07/05/2015   GABAPENTIN (NEURONTIN) 100 MG CAPSULE    Take 100 mg by mouth at bedtime.   HYDROCODONE-ACETAMINOPHEN (NORCO) 5-325 MG TABLET    Take 1 tablet by mouth every 6 (six) hours as needed for moderate pain.   LOVASTATIN (MEVACOR) 20 MG TABLET    Take 20 mg by mouth daily.   METFORMIN (GLUCOPHAGE) 500 MG TABLET    Take 500 mg by mouth 2 (two) times daily.   MULTIPLE VITAMIN (MULTIVITAMIN WITH MINERALS) TABS TABLET    Take 1 tablet by mouth daily.   OMEGA 3 1000 MG CAPS    Take 1,000 mg by mouth daily.   RIVAROXABAN (XARELTO) 20 MG TABS TABLET    Take 20 mg by mouth daily with supper. Reported on 08/12/2015   SOLIFENACIN (VESICARE)  5 MG TABLET    Take 5 mg by mouth daily.   VITAMIN C (ASCORBIC ACID) 500 MG TABLET    Take 500 mg by mouth daily.  Modified Medications   No medications on file  Discontinued Medications     SIGNIFICANT DIAGNOSTIC EXAMS  07-20-15: ct of head: 1.  No acute intracranial abnormality. 2. Mild motion degradation. 3.  Cerebral atrophy and small vessel ischemic change  07-21-15: renal ultrasound: 1. Mild bilateral pelviectasis without frank hydronephrosis. 2. Distention of the proximal right ureter which may be chronic. 3. Left renal stone measuring approximately 2.6 cm, also seen on previous CT. 4. Cholelithiasis.  07-21-15: mri of brain: 1. No acute intracranial abnormality. 2. Unchanged, extensive supratentorial and infratentorial white matter disease consistent with multiple sclerosis. 3. Advanced cerebral atrophy.  08-31-15: chest x-ray: No active disease  08-31-15:  ct of head: Atrophy and chronic white matter changes are stable. No superimposed acute abnormality  Atrophy and chronic white matter changes are stable. No superimposed acute abnormality    LABS REVIEWED:   07-13-15: chol 143; ldl 88; trig 71; hdl 41  07-15-15: wbc 12.9; hgb 8.9; hct 27.8; mcv 86.3; plt 541;  07-20-15: hgb a1c 5.8 07-31-15: glucose 103; bun 24; creat 0.54; k+ 4.0; na++138  08-30-15: wbc 16.8; hgb 13.0; hct 39.7; mcv 85.9; plt 384; glucose 161; bun 70; creat 1.39; k+ 4.1; na++149; ca++ 10.8; liver normal albumin 3.2; blood culture: no growth; urine culture: proteus mirabilis: keflex 09-01-15: wbc 12.3; hgb 8.0; hct 25.7; mcv 86.2; plt 437; glucose 142; bun 35; creat 0.76; k+ 3.3; na++142; liver normal albumin 2.4     Review of Systems  Constitutional: Negative for malaise/fatigue.  Respiratory: Negative for cough and shortness of breath.   Cardiovascular: Negative for chest pain and palpitations.  Gastrointestinal: Negative for heartburn, abdominal pain and constipation.  Musculoskeletal: Negative for myalgias, back pain and joint pain.  Skin: Negative.   Psychiatric/Behavioral: The patient is not nervous/anxious.     Physical Exam  Constitutional: No distress.  Frail   Eyes: Conjunctivae are normal.  Neck: Neck supple. No JVD present. No thyromegaly present.  Cardiovascular: Normal rate, regular rhythm and intact distal pulses.   Murmur heard. Respiratory: Effort normal and breath sounds normal. No respiratory distress. She has no wheezes.  GI: Soft. Bowel sounds are normal. She exhibits no distension. There is no tenderness.  Genitourinary:  Has foley   Musculoskeletal: She exhibits no edema.  Has paraplegia   Lymphadenopathy:    She has no cervical adenopathy.  Neurological: She is alert.  Skin: Skin is warm and dry. She is not diaphoretic.  Psychiatric: She has a normal mood and affect.        ASSESSMENT/ PLAN:  1. Gerd: will continue pepcid 20 mg twice  daily   2. DVT lower extremity: left leg 12/2014: will continue xarelto 20 mg daily   3.  Dyslipidemia: will continue mevacor 20 mg daily fish oil 1 gm daily  ldl is 88  4. Diabetes: will continue metformin 500 mg twice daily  hgb a1c 5.8;   5. Urine retention: has long term  foley does take vesicare 5 mg daily for bladder spasms   6. MS: was diagnosed in 2002 is no longer on therapy; will continue baclofen 10 mg three times daily; neurontin 100 mg nightly and has vicodin 5/325 mg every 6 hours as needed for pain management   7. Lower extremity edema: will continue lasix 40 mg daily as needed  and will monitor  8. Hypokalemia: did receive k+ supplement at hospital last k+ was 3.3; will repeat  9. Sepsis due to proteus mirabilis uti: will complete keflex and will monitor her status.   Will check cbc; cmp   Time spent with patient 50   minutes >50% time spent counseling; reviewing medical record; tests; labs; and developing future plan of care         Ok Edwards NP [facility]  Contact [phone] Monday through Friday 8am- 5pm  After hours call [phone]

## 2015-09-05 LAB — BASIC METABOLIC PANEL
BUN: 22 mg/dL — AB (ref 4–21)
Creatinine: 0.6 mg/dL (ref 0.5–1.1)
GLUCOSE: 108 mg/dL
POTASSIUM: 4.2 mmol/L (ref 3.4–5.3)
SODIUM: 143 mmol/L (ref 137–147)

## 2015-09-05 LAB — CULTURE, BLOOD (ROUTINE X 2)
CULTURE: NO GROWTH
Culture: NO GROWTH

## 2015-09-05 LAB — CBC AND DIFFERENTIAL
HCT: 27 % — AB (ref 36–46)
HEMOGLOBIN: 8.5 g/dL — AB (ref 12.0–16.0)
Platelets: 471 10*3/uL — AB (ref 150–399)
WBC: 11.2 10^3/mL

## 2015-09-10 ENCOUNTER — Encounter: Payer: Self-pay | Admitting: Internal Medicine

## 2015-09-10 ENCOUNTER — Non-Acute Institutional Stay (SKILLED_NURSING_FACILITY): Payer: Medicare Other | Admitting: Internal Medicine

## 2015-09-10 DIAGNOSIS — E876 Hypokalemia: Secondary | ICD-10-CM | POA: Diagnosis not present

## 2015-09-10 DIAGNOSIS — E440 Moderate protein-calorie malnutrition: Secondary | ICD-10-CM

## 2015-09-10 DIAGNOSIS — D649 Anemia, unspecified: Secondary | ICD-10-CM | POA: Diagnosis not present

## 2015-09-10 DIAGNOSIS — I10 Essential (primary) hypertension: Secondary | ICD-10-CM | POA: Diagnosis not present

## 2015-09-10 DIAGNOSIS — K219 Gastro-esophageal reflux disease without esophagitis: Secondary | ICD-10-CM | POA: Diagnosis not present

## 2015-09-10 DIAGNOSIS — I82402 Acute embolism and thrombosis of unspecified deep veins of left lower extremity: Secondary | ICD-10-CM | POA: Diagnosis not present

## 2015-09-10 DIAGNOSIS — Z960 Presence of urogenital implants: Secondary | ICD-10-CM

## 2015-09-10 DIAGNOSIS — N183 Chronic kidney disease, stage 3 unspecified: Secondary | ICD-10-CM

## 2015-09-10 DIAGNOSIS — E1122 Type 2 diabetes mellitus with diabetic chronic kidney disease: Secondary | ICD-10-CM | POA: Diagnosis not present

## 2015-09-10 DIAGNOSIS — Z9289 Personal history of other medical treatment: Secondary | ICD-10-CM | POA: Diagnosis not present

## 2015-09-10 DIAGNOSIS — Z978 Presence of other specified devices: Secondary | ICD-10-CM

## 2015-09-10 DIAGNOSIS — R600 Localized edema: Secondary | ICD-10-CM

## 2015-09-10 DIAGNOSIS — G35D Multiple sclerosis, unspecified: Secondary | ICD-10-CM

## 2015-09-10 DIAGNOSIS — G35 Multiple sclerosis: Secondary | ICD-10-CM

## 2015-09-10 NOTE — Progress Notes (Signed)
Patient ID: [name], female   DOB: 12/07/1960, 55 y.o.   MRN: [id number]    HISTORY AND PHYSICAL   DATE: 09/10/15  Location:  [facility] [place] [facility] Room Number: 742-V Place of Service: SNF (31)   Extended Emergency Contact Information Primary Emergency Contact: Paulino Rily, [place] [address] [place] of [place] Phone: [phone] Mobile Phone: [phone] Relation: Relative Secondary Emergency Contact: Charmayne Sheer, [place] [address] [place] Phone: [phone] Relation: Relative  Advanced Directive information  FULL CODE  Chief Complaint  Patient presents with  . Readmit To SNF    HPI:  55 yo female seen today as a readmission into SNF following hospital stay for sepsis due to UTI, acute encephalopathy, MS, LLE DVT hx, DM, AOCD, hypernatremia, decubitus ulcers, neurogenic bladder s/p foley cath. UTI tx with IV cefepime. Urine and blood cx neg -->po keflex. CT head neg acute process. Wound care followed for decubitus ulcers. AKI resolved with IVF. WBC 12.8K and Hgb 8 at d/c. Plts 437K. Na 149-->142 at d/c. K+ 3.3 at d/c. She was tx back to SNF for short term rehab  Today she has no c/o. No f/c. Pain stable. No nursing issues. Tolerating PT/OT. Appetite reduced. 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 LDL 88  DM - controlled on metformin 500 mg twice daily. A1c 5.8%  Neurogenic bladder - chronic foley. Takes vesicare 5 mg daily for bladder spasms   Hx MS dx in 2002/FTT-  no longer on therapy; takes baclofen 10 mg three times daily; neurontin 100 mg nightly and norco 5/325 mg every 6 hours as needed for pain management . She gets nutritional supplements  LE edema - stable on lasix 40 mg daily as needed  Past Medical History  Diagnosis Date  .  Diabetes mellitus without complication ([place])   . Multiple sclerosis diagnosed 2002-not on Therapy any longer 06/26/2012  . Stage 4 decubitus ulcer ([place]) 07/05/2015  . Acute renal failure superimposed on stage 3 chronic kidney disease ([place]) 07/21/2015  . Malnutrition of moderate degree 07/21/2015  . UTI (lower urinary tract infection)     Past Surgical History  Procedure Laterality Date  . Hernia mesh removal    . Tubes tided    . Tubal ligation    . Esophagogastroduodenoscopy Left 01/02/2015    Procedure: ESOPHAGOGASTRODUODENOSCOPY (EGD);  Surgeon: Arta Silence, MD;  Location: [facility];  Service: Endoscopy;  Laterality: Left;    Patient Care Team: Gildardo Cranker, DO as PCP - General (Internal Medicine) [place] ([place])  Social History   Social History  . Marital Status: Single    Spouse Name: N/A  . Number of Children: N/A  . Years of Education: N/A   Occupational History  . Not on file.   Social History Main Topics  . Smoking status: Former Smoker -- 1.00 packs/day for 20 years    Types: Cigarettes    Quit date: 11/19/2014  . Smokeless tobacco: Never Used  . Alcohol Use: 3.0 oz/week    5 Glasses of wine per week  . Drug Use: No  . Sexual Activity: Not Currently   Other Topics Concern  .  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              reports that she quit smoking about 9 months ago. Her smoking use included Cigarettes. She has a 20 pack-year smoking history. She has never used smokeless tobacco. She reports that she drinks about 3.0 oz of alcohol per week. She reports that she does not use illicit drugs.  Family History  Problem Relation Age of Onset  . Diabetes Mother   . Diabetes Father   . Diabetes Sister   . Scoliosis Sister   . Alzheimer's disease Mother    Family Status  Relation Status  Death Age  . Mother Alive   . Father Alive     Immunization History  Administered Date(s) Administered  . Influenza Split 06/26/2012  . Influenza-Unspecified 04/02/2015  . PPD Test 01/04/2015, 07/09/2015, 07/23/2015, 09/02/2015  . Pneumococcal-Unspecified 03/13/2014    No Known Allergies  Medications: Patient's Medications  New Prescriptions   No medications on file  Previous Medications   ACETAMINOPHEN (APAP) 325 MG TABLET    Take 650 mg by mouth every 6 (six) hours as needed for pain.   ALBUTEROL (PROVENTIL HFA;VENTOLIN HFA) 108 (90 BASE) MCG/ACT INHALER    Inhale 2 puffs into the lungs every 4 (four) hours as needed for wheezing or shortness of breath.   AMBULATORY NON FORMULARY MEDICATION    Med Pass Offer 120 ml three times daily for weight loss.   AMINO ACIDS-PROTEIN HYDROLYS (FEEDING SUPPLEMENT, PRO-STAT SUGAR FREE 64,) LIQD    Take 30 mLs by mouth 2 (two) times daily.   BACLOFEN (LIORESAL) 10 MG TABLET    Take 10 mg by mouth 3 (three) times daily.    CEPHALEXIN (KEFLEX) 500 MG CAPSULE    Take 1 capsule (500 mg total) by mouth 4 (four) times daily.   FAMOTIDINE (PEPCID) 20 MG TABLET    Take 1 tablet (20 mg total) by mouth 2 (two) times daily.   FUROSEMIDE (LASIX) 40 MG TABLET    Take 1 tablet (40 mg total) by mouth daily as needed for fluid or edema. Reported on 07/05/2015   GABAPENTIN (NEURONTIN) 100 MG CAPSULE    Take 100 mg by mouth at bedtime.   HYDROCODONE-ACETAMINOPHEN (NORCO) 5-325 MG TABLET    Take 1 tablet by mouth every 6 (six) hours as needed for moderate pain.   LOVASTATIN (MEVACOR) 20 MG TABLET    Take 20 mg by mouth daily.   METFORMIN (GLUCOPHAGE) 500 MG TABLET    Take 500 mg by mouth 2 (two) times daily.   MULTIPLE VITAMIN (MULTIVITAMIN WITH MINERALS) TABS TABLET    Take 1 tablet by mouth daily.   NUTRITIONAL SUPPLEMENTS (NUTRITIONAL SUPPLEMENT PO)    Take 120 mLs by mouth 3 (three) times daily. Med pass   OMEGA 3 1000 MG CAPS    Take 1,000 mg by mouth daily.    POTASSIUM CHLORIDE SA (K-DUR,KLOR-CON) 20 MEQ TABLET    Take 2 tablets (40 mEq total) by mouth once.   RIVAROXABAN (XARELTO) 20 MG TABS TABLET    Take 20 mg by mouth daily with supper. Reported on 08/12/2015   SOLIFENACIN (VESICARE) 5 MG TABLET    Take 5 mg by mouth daily.   VITAMIN C (ASCORBIC ACID) 500 MG TABLET    Take 500 mg by mouth  daily.  Modified Medications   No medications on file  Discontinued Medications   No medications on file    Review of Systems  Constitutional: Positive for appetite change.  Cardiovascular: Positive for leg swelling.  Neurological: Positive for weakness.  All other systems reviewed and are negative.   Filed Vitals:   09/10/15 1059  BP: 121/77  Pulse: 65  Temp: 97 F (36.1 C)  TempSrc: Oral  Resp: 18  Height: '5\' 6"'  (1.676 m)  Weight: 128 lb 6 oz (58.231 kg)   Body mass index is 20.73 kg/(m^2).  Physical Exam  Constitutional: She is oriented to person, place, and time. She appears well-developed.  Frail appearing in nAD, lying in bed  HENT:  Mouth/Throat: Oropharynx is clear and moist. No oropharyngeal exudate.  Eyes: Pupils are equal, round, and reactive to light. No scleral icterus.  Neck: Neck supple. Carotid bruit is not present. No tracheal deviation present. No thyromegaly present.  Cardiovascular: Normal rate, regular rhythm and intact distal pulses.  Exam reveals no gallop and no friction rub.   Murmur (1/6 SEM) heard. Trace LE edema b/l. No calf TTP. B/l chronic venous stasis changes  Pulmonary/Chest: Effort normal and breath sounds normal. No stridor. No respiratory distress. She has no wheezes. She has no rales.  Abdominal: Soft. Bowel sounds are normal. She exhibits no distension and no mass. There is no hepatomegaly. There is no tenderness. There is no rebound and no guarding.  Genitourinary:  Foley DTG clear yellow urine  Musculoskeletal: She exhibits edema and tenderness.  Lymphadenopathy:    She has no cervical adenopathy.   Neurological: She is alert and oriented to person, place, and time. She displays atrophy. She exhibits abnormal muscle tone.  Skin: Skin is warm and dry. Rash (chronic venous stasis  changes b/l LE) noted.  Psychiatric: She has a normal mood and affect. Her behavior is normal. Thought content normal.     Labs reviewed: Admission on 08/30/2015, Discharged on 09/01/2015  Component Date Value Ref Range Status  . Lactic Acid, Venous 08/31/2015 2.18* 0.5 - 2.0 mmol/L Final  . Comment 08/31/2015 NOTIFIED PHYSICIAN   Final  . Sodium 08/31/2015 149* 135 - 145 mmol/L Final  . Potassium 08/31/2015 4.1  3.5 - 5.1 mmol/L Final  . Chloride 08/31/2015 109  101 - 111 mmol/L Final  . CO2 08/31/2015 26  22 - 32 mmol/L Final  . Glucose, Bld 08/31/2015 161* 65 - 99 mg/dL Final  . BUN 08/31/2015 70* 6 - 20 mg/dL Final  . Creatinine, Ser 08/31/2015 1.39* 0.44 - 1.00 mg/dL Final  . Calcium 08/31/2015 10.8* 8.9 - 10.3 mg/dL Final  . Total Protein 08/31/2015 8.8* 6.5 - 8.1 g/dL Final  . Albumin 08/31/2015 3.2* 3.5 - 5.0 g/dL Final  . AST 08/31/2015 19  15 - 41 U/L Final  . ALT 08/31/2015 15  14 - 54 U/L Final  . Alkaline Phosphatase 08/31/2015 66  38 - 126 U/L Final  . Total Bilirubin 08/31/2015 0.6  0.3 - 1.2 mg/dL Final  . GFR calc non Af Amer 08/31/2015 41* >60 mL/min Final  . GFR calc Af Amer 08/31/2015 48* >60 mL/min Final   Comment: (NOTE) The eGFR has been calculated using the CKD EPI equation. This calculation has not been validated in all clinical situations. eGFR's persistently <60 mL/min signify possible Chronic Kidney Disease.   . Anion gap 08/31/2015 14  5 - 15 Final  . Specimen Description 08/31/2015 BLOOD RIGHT ANTECUBITAL   Final  .  Special Requests 08/31/2015 BOTTLES DRAWN AEROBIC ONLY 5CC   Final  . Culture 08/31/2015 NO GROWTH 5 DAYS   Final  . Report Status 08/31/2015 09/05/2015 FINAL   Final  . Specimen Description 08/31/2015 BLOOD RIGHT HAND   Final  . Special Requests  08/31/2015 BOTTLES DRAWN AEROBIC ONLY 5CC   Final  . Culture 08/31/2015 NO GROWTH 5 DAYS   Final  . Report Status 08/31/2015 09/05/2015 FINAL   Final  . Color, Urine 08/30/2015 YELLOW  YELLOW Final  . APPearance 08/30/2015 TURBID* CLEAR Final  . Specific Gravity, Urine 08/30/2015 1.014  1.005 - 1.030 Final  . pH 08/30/2015 8.0  5.0 - 8.0 Final  . Glucose, UA 08/30/2015 NEGATIVE  NEGATIVE mg/dL Final  . Hgb urine dipstick 08/30/2015 LARGE* NEGATIVE Final  . Bilirubin Urine 08/30/2015 NEGATIVE  NEGATIVE Final  . Ketones, ur 08/30/2015 NEGATIVE  NEGATIVE mg/dL Final  . Protein, ur 08/30/2015 100* NEGATIVE mg/dL Final  . Nitrite 08/30/2015 POSITIVE* NEGATIVE Final  . Leukocytes, UA 08/30/2015 LARGE* NEGATIVE Final  . Specimen Description 08/30/2015 URINE, CATHETERIZED   Final  . Special Requests 08/30/2015 NONE   Final  . Culture 08/30/2015 >=100,000 COLONIES/mL PROTEUS MIRABILIS   Final  . Report Status 08/30/2015 09/02/2015 FINAL   Final  . Organism ID, Bacteria 08/30/2015 PROTEUS MIRABILIS   Final  . WBC 08/31/2015 16.8* 4.0 - 10.5 K/uL Final  . RBC 08/31/2015 4.62  3.87 - 5.11 MIL/uL Final  . Hemoglobin 08/31/2015 13.0  12.0 - 15.0 g/dL Final  . HCT 08/31/2015 39.7  36.0 - 46.0 % Final  . MCV 08/31/2015 85.9  78.0 - 100.0 fL Final  . MCH 08/31/2015 28.1  26.0 - 34.0 pg Final  . MCHC 08/31/2015 32.7  30.0 - 36.0 g/dL Final  . RDW 08/31/2015 19.2* 11.5 - 15.5 % Final  . Platelets 08/31/2015 384  150 - 400 K/uL Final  . Neutrophils Relative % 08/31/2015 77   Final  . Neutro Abs 08/31/2015 13.0* 1.7 - 7.7 K/uL Final  . Lymphocytes Relative 08/31/2015 15   Final  . Lymphs Abs 08/31/2015 2.5  0.7 - 4.0 K/uL Final  . Monocytes Relative 08/31/2015 7   Final  . Monocytes Absolute 08/31/2015 1.3* 0.1 - 1.0 K/uL Final  . Eosinophils Relative 08/31/2015 1   Final  . Eosinophils Absolute 08/31/2015 0.1  0.0 - 0.7 K/uL Final  . Basophils Relative 08/31/2015 0   Final  . Basophils Absolute  08/31/2015 0.1  0.0 - 0.1 K/uL Final  . Squamous Epithelial / LPF 08/30/2015 0-5* NONE SEEN Final  . WBC, UA 08/30/2015 TOO NUMEROUS TO COUNT  0 - 5 WBC/hpf Final  . RBC / HPF 08/30/2015 0-5  0 - 5 RBC/hpf Final  . Bacteria, UA 08/30/2015 MANY* NONE SEEN Final  . Crystals 08/30/2015 TRIPLE PHOSPHATE CRYSTALS* NEGATIVE Final  . Lactic Acid, Venous 08/31/2015 1.51  0.5 - 2.0 mmol/L Final  . MRSA by PCR 08/31/2015 NEGATIVE  NEGATIVE Final   Comment:        The GeneXpert MRSA Assay (FDA approved for NASAL specimens only), is one component of a comprehensive MRSA colonization surveillance program. It is not intended to diagnose MRSA infection nor to guide or monitor treatment for MRSA infections.   . WBC 08/31/2015 16.5* 4.0 - 10.5 K/uL Final  . RBC 08/31/2015 3.50* 3.87 - 5.11 MIL/uL Final  . Hemoglobin 08/31/2015 9.5* 12.0 - 15.0 g/dL Final   REPEATED TO VERIFY  . HCT 08/31/2015 30.0* 36.0 -  46.0 % Final  . MCV 08/31/2015 85.7  78.0 - 100.0 fL Final  . MCH 08/31/2015 27.1  26.0 - 34.0 pg Final  . MCHC 08/31/2015 31.7  30.0 - 36.0 g/dL Final  . RDW 08/31/2015 19.0* 11.5 - 15.5 % Final  . Platelets 08/31/2015 505* 150 - 400 K/uL Final   PLATELET COUNT CONFIRMED BY SMEAR  . Neutrophils Relative % 08/31/2015 75   Final  . Lymphocytes Relative 08/31/2015 16   Final  . Monocytes Relative 08/31/2015 8   Final  . Eosinophils Relative 08/31/2015 1   Final  . Basophils Relative 08/31/2015 0   Final  . Neutro Abs 08/31/2015 12.4* 1.7 - 7.7 K/uL Final  . Lymphs Abs 08/31/2015 2.6  0.7 - 4.0 K/uL Final  . Monocytes Absolute 08/31/2015 1.3* 0.1 - 1.0 K/uL Final  . Eosinophils Absolute 08/31/2015 0.2  0.0 - 0.7 K/uL Final  . Basophils Absolute 08/31/2015 0.0  0.0 - 0.1 K/uL Final  . RBC Morphology 08/31/2015 POLYCHROMASIA PRESENT   Final  . WBC Morphology 08/31/2015 ATYPICAL LYMPHOCYTES   Final  . Sodium 08/31/2015 152* 135 - 145 mmol/L Final  . Potassium 08/31/2015 3.7  3.5 - 5.1 mmol/L  Final  . Chloride 08/31/2015 115* 101 - 111 mmol/L Final  . CO2 08/31/2015 26  22 - 32 mmol/L Final  . Glucose, Bld 08/31/2015 132* 65 - 99 mg/dL Final  . BUN 08/31/2015 61* 6 - 20 mg/dL Final  . Creatinine, Ser 08/31/2015 1.21* 0.44 - 1.00 mg/dL Final  . Calcium 08/31/2015 10.4* 8.9 - 10.3 mg/dL Final  . Total Protein 08/31/2015 8.3* 6.5 - 8.1 g/dL Final  . Albumin 08/31/2015 2.9* 3.5 - 5.0 g/dL Final  . AST 08/31/2015 16  15 - 41 U/L Final  . ALT 08/31/2015 13* 14 - 54 U/L Final  . Alkaline Phosphatase 08/31/2015 59  38 - 126 U/L Final  . Total Bilirubin 08/31/2015 0.6  0.3 - 1.2 mg/dL Final  . GFR calc non Af Amer 08/31/2015 49* >60 mL/min Final  . GFR calc Af Amer 08/31/2015 57* >60 mL/min Final   Comment: (NOTE) The eGFR has been calculated using the CKD EPI equation. This calculation has not been validated in all clinical situations. eGFR's persistently <60 mL/min signify possible Chronic Kidney Disease.   . Anion gap 08/31/2015 11  5 - 15 Final  . Lactic Acid, Venous 08/31/2015 2.6* 0.5 - 2.0 mmol/L Final   Comment: CRITICAL RESULT CALLED TO, READ BACK BY AND VERIFIED WITH: J.[email or web address],RN [id number] 08/31/15 CLARK,S   . Lactic Acid, Venous 08/31/2015 3.0* 0.5 - 2.0 mmol/L Final   Comment: CRITICAL RESULT CALLED TO, READ BACK BY AND VERIFIED WITH: T.DUONG,RN 1034 08/31/15 CLARK,S   . Procalcitonin 08/31/2015 5.50   Final   Comment:        Interpretation: PCT > 2 ng/mL: Systemic infection (sepsis) is likely, unless other causes are known. (NOTE)         ICU PCT Algorithm               Non ICU PCT Algorithm    ----------------------------     ------------------------------         PCT < 0.25 ng/mL                 PCT < 0.1 ng/mL     Stopping of antibiotics            Stopping of antibiotics       strongly encouraged.  strongly encouraged.    ----------------------------     ------------------------------       PCT level decrease by               PCT < 0.25 ng/mL        >= 80% from peak PCT       OR PCT 0.25 - 0.5 ng/mL          Stopping of antibiotics                                             encouraged.     Stopping of antibiotics           encouraged.    ----------------------------     ------------------------------       PCT level decrease by              PCT >= 0.25 ng/mL       < 80% from peak PCT        AND PCT >= 0.5 ng/mL            Continuing antibiotics                                                                        encouraged.       Continuing antibiotics            encouraged.    ----------------------------     ------------------------------     PCT level increase compared          PCT > 0.5 ng/mL         with peak PCT AND          PCT >= 0.5 ng/mL             Escalation of antibiotics                                          strongly encouraged.      Escalation of antibiotics        strongly encouraged.   . Glucose-Capillary 08/31/2015 114* 65 - 99 mg/dL Final  . Glucose-Capillary 08/31/2015 122* 65 - 99 mg/dL Final  . Comment 1 08/31/2015 Notify RN   Final  . WBC 09/01/2015 12.3* 4.0 - 10.5 K/uL Final  . RBC 09/01/2015 2.98* 3.87 - 5.11 MIL/uL Final  . Hemoglobin 09/01/2015 8.0* 12.0 - 15.0 g/dL Final  . HCT 09/01/2015 25.7* 36.0 - 46.0 % Final  . MCV 09/01/2015 86.2  78.0 - 100.0 fL Final  . MCH 09/01/2015 26.8  26.0 - 34.0 pg Final  . MCHC 09/01/2015 31.1  30.0 - 36.0 g/dL Final  . RDW 09/01/2015 18.8* 11.5 - 15.5 % Final  . Platelets 09/01/2015 437* 150 - 400 K/uL Final  . Sodium 09/01/2015 142  135 - 145 mmol/L Final   DELTA CHECK NOTED  . Potassium 09/01/2015 3.3* 3.5 - 5.1 mmol/L Final  . Chloride 09/01/2015 112* 101 - 111 mmol/L Final  . CO2 09/01/2015 24  22 -  32 mmol/L Final  . Glucose, Bld 09/01/2015 142* 65 - 99 mg/dL Final  . BUN 09/01/2015 35* 6 - 20 mg/dL Final  . Creatinine, Ser 09/01/2015 0.76  0.44 - 1.00 mg/dL Final  . Calcium 09/01/2015 9.8  8.9 - 10.3 mg/dL Final  . Total Protein 09/01/2015 6.7   6.5 - 8.1 g/dL Final  . Albumin 09/01/2015 2.4* 3.5 - 5.0 g/dL Final  . AST 09/01/2015 14* 15 - 41 U/L Final  . ALT 09/01/2015 13* 14 - 54 U/L Final  . Alkaline Phosphatase 09/01/2015 52  38 - 126 U/L Final  . Total Bilirubin 09/01/2015 0.5  0.3 - 1.2 mg/dL Final  . GFR calc non Af Amer 09/01/2015 >60  >60 mL/min Final  . GFR calc Af Amer 09/01/2015 >60  >60 mL/min Final   Comment: (NOTE) The eGFR has been calculated using the CKD EPI equation. This calculation has not been validated in all clinical situations. eGFR's persistently <60 mL/min signify possible Chronic Kidney Disease.   . Anion gap 09/01/2015 6  5 - 15 Final  . Glucose-Capillary 08/31/2015 114* 65 - 99 mg/dL Final  . Glucose-Capillary 08/31/2015 142* 65 - 99 mg/dL Final  . Glucose-Capillary 09/01/2015 135* 65 - 99 mg/dL Final  . Glucose-Capillary 09/01/2015 268* 65 - 99 mg/dL Final  Nursing Home on 08/12/2015  Component Date Value Ref Range Status  . Glucose 08/01/2015 103   Final  . BUN 08/01/2015 24* 4 - 21 mg/dL Final  . Creatinine 08/01/2015 0.5  0.5 - 1.1 mg/dL Final  . Potassium 08/01/2015 4.0  3.4 - 5.3 mmol/L Final  . Sodium 08/01/2015 138  137 - 147 mmol/L Final  . Hemoglobin 07/15/2015 8.9* 12.0 - 16.0 g/dL Final  . HCT 07/15/2015 28* 36 - 46 % Final  . Platelets 07/15/2015 541* 150 - 399 K/L Final  . WBC 07/15/2015 12.9   Final  . Glucose 07/15/2015 118   Final  . BUN 07/15/2015 19  4 - 21 mg/dL Final  . Creatinine 07/15/2015 0.5  0.5 - 1.1 mg/dL Final  . Potassium 07/15/2015 4.0  3.4 - 5.3 mmol/L Final  . Sodium 07/15/2015 137  137 - 147 mmol/L Final  . Glucose 07/13/2015 145   Final  . BUN 07/13/2015 15  4 - 21 mg/dL Final  . Creatinine 07/13/2015 0.5  0.5 - 1.1 mg/dL Final  . Potassium 07/13/2015 3.9  3.4 - 5.3 mmol/L Final  . Sodium 07/13/2015 135* 137 - 147 mmol/L Final  . Triglycerides 07/13/2015 71  40 - 160 mg/dL Final  . Cholesterol 07/13/2015 143  0 - 200 mg/dL Final  . HDL 07/13/2015 41   35 - 70 mg/dL Final  . LDL Cholesterol 07/13/2015 88   Final  . Alkaline Phosphatase 07/13/2015 46  25 - 125 U/L Final  . ALT 07/13/2015 12  7 - 35 U/L Final  . AST 07/13/2015 12* 13 - 35 U/L Final  . Bilirubin, Total 07/13/2015 0.4   Final  Admission on 07/20/2015, Discharged on 07/23/2015  Component Date Value Ref Range Status  . Glucose-Capillary 07/20/2015 102* 65 - 99 mg/dL Final  . WBC 07/20/2015 15.9* 4.0 - 10.5 K/uL Final  . RBC 07/20/2015 3.48* 3.87 - 5.11 MIL/uL Final  . Hemoglobin 07/20/2015 9.7* 12.0 - 15.0 g/dL Final  . HCT 07/20/2015 29.8* 36.0 - 46.0 % Final  . MCV 07/20/2015 85.6  78.0 - 100.0 fL Final  . MCH 07/20/2015 27.9  26.0 - 34.0 pg Final  .  MCHC 07/20/2015 32.6  30.0 - 36.0 g/dL Final  . RDW 07/20/2015 17.2* 11.5 - 15.5 % Final  . Platelets 07/20/2015 666* 150 - 400 K/uL Final  . Neutrophils Relative % 07/20/2015 75   Final  . Neutro Abs 07/20/2015 11.8* 1.7 - 7.7 K/uL Final  . Lymphocytes Relative 07/20/2015 14   Final  . Lymphs Abs 07/20/2015 2.2  0.7 - 4.0 K/uL Final  . Monocytes Relative 07/20/2015 8   Final  . Monocytes Absolute 07/20/2015 1.3* 0.1 - 1.0 K/uL Final  . Eosinophils Relative 07/20/2015 3   Final  . Eosinophils Absolute 07/20/2015 0.4  0.0 - 0.7 K/uL Final  . Basophils Relative 07/20/2015 0   Final  . Basophils Absolute 07/20/2015 0.1  0.0 - 0.1 K/uL Final  . Sodium 07/20/2015 141  135 - 145 mmol/L Final  . Potassium 07/20/2015 5.5* 3.5 - 5.1 mmol/L Final  . Chloride 07/20/2015 106  101 - 111 mmol/L Final  . CO2 07/20/2015 23  22 - 32 mmol/L Final  . Glucose, Bld 07/20/2015 114* 65 - 99 mg/dL Final  . BUN 07/20/2015 80* 6 - 20 mg/dL Final  . Creatinine, Ser 07/20/2015 3.82* 0.44 - 1.00 mg/dL Final  . Calcium 07/20/2015 10.1  8.9 - 10.3 mg/dL Final  . Total Protein 07/20/2015 8.1  6.5 - 8.1 g/dL Final  . Albumin 07/20/2015 3.0* 3.5 - 5.0 g/dL Final  . AST 07/20/2015 13* 15 - 41 U/L Final  . ALT 07/20/2015 14  14 - 54 U/L Final  .  Alkaline Phosphatase 07/20/2015 47  38 - 126 U/L Final  . Total Bilirubin 07/20/2015 0.5  0.3 - 1.2 mg/dL Final  . GFR calc non Af Amer 07/20/2015 12* >60 mL/min Final  . GFR calc Af Amer 07/20/2015 14* >60 mL/min Final   Comment: (NOTE) The eGFR has been calculated using the CKD EPI equation. This calculation has not been validated in all clinical situations. eGFR's persistently <60 mL/min signify possible Chronic Kidney Disease.   . Anion gap 07/20/2015 12  5 - 15 Final  . Ammonia 07/20/2015 21  9 - 35 umol/L Final  . Lactic Acid, Venous 07/20/2015 1.75  0.5 - 2.0 mmol/L Final  . Troponin I 07/20/2015 <0.03  <0.031 ng/mL Final   Comment:        NO INDICATION OF MYOCARDIAL INJURY.   . Color, Urine 07/20/2015 YELLOW  YELLOW Final  . APPearance 07/20/2015 CLOUDY* CLEAR Final  . Specific Gravity, Urine 07/20/2015 1.013  1.005 - 1.030 Final  . pH 07/20/2015 5.5  5.0 - 8.0 Final  . Glucose, UA 07/20/2015 NEGATIVE  NEGATIVE mg/dL Final  . Hgb urine dipstick 07/20/2015 LARGE* NEGATIVE Final  . Bilirubin Urine 07/20/2015 NEGATIVE  NEGATIVE Final  . Ketones, ur 07/20/2015 NEGATIVE  NEGATIVE mg/dL Final  . Protein, ur 07/20/2015 30* NEGATIVE mg/dL Final  . Nitrite 07/20/2015 NEGATIVE  NEGATIVE Final  . Leukocytes, UA 07/20/2015 SMALL* NEGATIVE Final  . Specimen Description 07/20/2015 BLOOD LEFT HAND   Final  . Special Requests 07/20/2015 BOTTLES DRAWN AEROBIC AND ANAEROBIC 5 CC EACH   Final  . Culture 07/20/2015    Final                   Value:NO GROWTH 5 DAYS Performed at [facility]   . Report Status 07/20/2015 07/25/2015 FINAL   Final  . Specimen Description 07/20/2015 BLOOD LEFT HAND   Final  . Special Requests 07/20/2015 BOTTLES DRAWN AEROBIC ONLY 5CC  Final  . Culture 07/20/2015    Final                   Value:NO GROWTH 5 DAYS Performed at [facility]   . Report Status 07/20/2015 07/25/2015 FINAL   Final  . Opiates 07/20/2015 POSITIVE* NONE DETECTED Final   . Cocaine 07/20/2015 NONE DETECTED  NONE DETECTED Final  . Benzodiazepines 07/20/2015 NONE DETECTED  NONE DETECTED Final  . Amphetamines 07/20/2015 NONE DETECTED  NONE DETECTED Final  . Tetrahydrocannabinol 07/20/2015 NONE DETECTED  NONE DETECTED Final  . Barbiturates 07/20/2015 NONE DETECTED  NONE DETECTED Final   Comment:        DRUG SCREEN FOR MEDICAL PURPOSES ONLY.  IF CONFIRMATION IS NEEDED FOR ANY PURPOSE, NOTIFY LAB WITHIN 5 DAYS.        LOWEST DETECTABLE LIMITS FOR URINE DRUG SCREEN Drug Class       Cutoff (ng/mL) Amphetamine      1000 Barbiturate      200 Benzodiazepine   161 Tricyclics       096 Opiates          300 Cocaine          300 THC              50   . Salicylate Lvl 04/54/0981 <4.0  2.8 - 30.0 mg/dL Final  . Acetaminophen (Tylenol), Serum 07/20/2015 <10* 10 - 30 ug/mL Final   Comment:        THERAPEUTIC CONCENTRATIONS VARY SIGNIFICANTLY. A RANGE OF 10-30 ug/mL MAY BE AN EFFECTIVE CONCENTRATION FOR MANY PATIENTS. HOWEVER, SOME ARE BEST TREATED AT CONCENTRATIONS OUTSIDE THIS RANGE. ACETAMINOPHEN CONCENTRATIONS >150 ug/mL AT 4 HOURS AFTER INGESTION AND >50 ug/mL AT 12 HOURS AFTER INGESTION ARE OFTEN ASSOCIATED WITH TOXIC REACTIONS.   Marland Kitchen Alcohol, Ethyl (B) 07/20/2015 <5  <5 mg/dL Final   Comment:        LOWEST DETECTABLE LIMIT FOR SERUM ALCOHOL IS 5 mg/dL FOR MEDICAL PURPOSES ONLY   . Squamous Epithelial / LPF 07/20/2015 0-5* NONE SEEN Final  . WBC, UA 07/20/2015 6-30  0 - 5 WBC/hpf Final  . RBC / HPF 07/20/2015 TOO NUMEROUS TO COUNT  0 - 5 RBC/hpf Final  . Bacteria, UA 07/20/2015 FEW* NONE SEEN Final  . Crystals 07/20/2015 CA OXALATE CRYSTALS* NEGATIVE Final  . Urine-Other 07/20/2015 MUCOUS PRESENT   Final  . Specimen Description 07/20/2015 URINE, CATHETERIZED   Final  . Special Requests 07/20/2015 NONE   Final  . Culture 07/20/2015    Final                   Value:NO GROWTH 1 DAY Performed at [facility]   . Report Status 07/20/2015  07/21/2015 FINAL   Final  . Hgb A1c MFr Bld 07/20/2015 5.8* 4.8 - 5.6 % Final   Comment: (NOTE)         Pre-diabetes: 5.7 - 6.4         Diabetes: >6.4         Glycemic control for adults with diabetes: <7.0   . Mean Plasma Glucose 07/20/2015 120   Final   Comment: (NOTE) Performed At: [facility] [place], [place] [id number] Lindon Romp MD AO:[id number]   . Magnesium 07/21/2015 2.3  1.7 - 2.4 mg/dL Final  . Phosphorus 07/21/2015 5.0* 2.5 - 4.6 mg/dL Final  . TSH 07/21/2015 1.787  0.350 - 4.500 uIU/mL Final  . Sodium 07/21/2015 143  135 -  145 mmol/L Final  . Potassium 07/21/2015 4.5  3.5 - 5.1 mmol/L Final  . Chloride 07/21/2015 113* 101 - 111 mmol/L Final  . CO2 07/21/2015 21* 22 - 32 mmol/L Final  . Glucose, Bld 07/21/2015 104* 65 - 99 mg/dL Final  . BUN 07/21/2015 72* 6 - 20 mg/dL Final  . Creatinine, Ser 07/21/2015 3.24* 0.44 - 1.00 mg/dL Final  . Calcium 07/21/2015 9.2  8.9 - 10.3 mg/dL Final  . Total Protein 07/21/2015 6.6  6.5 - 8.1 g/dL Final  . Albumin 07/21/2015 2.5* 3.5 - 5.0 g/dL Final  . AST 07/21/2015 11* 15 - 41 U/L Final  . ALT 07/21/2015 10* 14 - 54 U/L Final  . Alkaline Phosphatase 07/21/2015 40  38 - 126 U/L Final  . Total Bilirubin 07/21/2015 0.3  0.3 - 1.2 mg/dL Final  . GFR calc non Af Amer 07/21/2015 15* >60 mL/min Final  . GFR calc Af Amer 07/21/2015 17* >60 mL/min Final   Comment: (NOTE) The eGFR has been calculated using the CKD EPI equation. This calculation has not been validated in all clinical situations. eGFR's persistently <60 mL/min signify possible Chronic Kidney Disease.   . Anion gap 07/21/2015 9  5 - 15 Final  . WBC 07/21/2015 11.5* 4.0 - 10.5 K/uL Final  . RBC 07/21/2015 2.91* 3.87 - 5.11 MIL/uL Final  . Hemoglobin 07/21/2015 8.1* 12.0 - 15.0 g/dL Final  . HCT 07/21/2015 25.7* 36.0 - 46.0 % Final  . MCV 07/21/2015 88.3  78.0 - 100.0 fL Final  . MCH 07/21/2015 27.8  26.0 - 34.0 pg Final  . MCHC 07/21/2015  31.5  30.0 - 36.0 g/dL Final  . RDW 07/21/2015 17.7* 11.5 - 15.5 % Final  . Platelets 07/21/2015 646* 150 - 400 K/uL Final  . Troponin I 07/20/2015 <0.03  <0.031 ng/mL Final   Comment:        NO INDICATION OF MYOCARDIAL INJURY.   . Troponin I 07/21/2015 <0.03  <0.031 ng/mL Final   Comment:        NO INDICATION OF MYOCARDIAL INJURY.   . Troponin I 07/21/2015 <0.03  <0.031 ng/mL Final   Comment:        NO INDICATION OF MYOCARDIAL INJURY. REPEATED TO VERIFY   . Sodium, Ur 07/21/2015 69   Final   Performed at [facility]  . Creatinine, Urine 07/21/2015 40.84   Final   Performed at [facility]  . Sodium 07/20/2015 142  135 - 145 mmol/L Final  . Potassium 07/20/2015 4.7  3.5 - 5.1 mmol/L Final  . Chloride 07/20/2015 112* 101 - 111 mmol/L Final  . CO2 07/20/2015 21* 22 - 32 mmol/L Final  . Glucose, Bld 07/20/2015 122* 65 - 99 mg/dL Final  . BUN 07/20/2015 79* 6 - 20 mg/dL Final  . Creatinine, Ser 07/20/2015 3.66* 0.44 - 1.00 mg/dL Final  . Calcium 07/20/2015 9.2  8.9 - 10.3 mg/dL Final  . GFR calc non Af Amer 07/20/2015 13* >60 mL/min Final  . GFR calc Af Amer 07/20/2015 15* >60 mL/min Final   Comment: (NOTE) The eGFR has been calculated using the CKD EPI equation. This calculation has not been validated in all clinical situations. eGFR's persistently <60 mL/min signify possible Chronic Kidney Disease.   . Anion gap 07/20/2015 9  5 - 15 Final  . Prealbumin 07/21/2015 13.9* 18 - 38 mg/dL Final   Performed at [facility]  . MRSA by PCR 07/20/2015 NEGATIVE  NEGATIVE Final  Comment:        The GeneXpert MRSA Assay (FDA approved for NASAL specimens only), is one component of a comprehensive MRSA colonization surveillance program. It is not intended to diagnose MRSA infection nor to guide or monitor treatment for MRSA infections.   . Glucose-Capillary 07/20/2015 105* 65 - 99 mg/dL Final  . Glucose-Capillary 07/21/2015 143* 65 - 99 mg/dL Final  .  Glucose-Capillary 07/21/2015 97  65 - 99 mg/dL Final  . Glucose-Capillary 07/21/2015 94  65 - 99 mg/dL Final  . Glucose-Capillary 07/21/2015 135* 65 - 99 mg/dL Final  . Glucose-Capillary 07/21/2015 106* 65 - 99 mg/dL Final  . Glucose-Capillary 07/21/2015 151* 65 - 99 mg/dL Final  . Glucose-Capillary 07/21/2015 96  65 - 99 mg/dL Final  . Glucose-Capillary 07/22/2015 91  65 - 99 mg/dL Final  . Glucose-Capillary 07/22/2015 128* 65 - 99 mg/dL Final  . WBC 07/22/2015 12.1* 4.0 - 10.5 K/uL Final  . RBC 07/22/2015 3.04* 3.87 - 5.11 MIL/uL Final  . Hemoglobin 07/22/2015 8.5* 12.0 - 15.0 g/dL Final  . HCT 07/22/2015 26.9* 36.0 - 46.0 % Final  . MCV 07/22/2015 88.5  78.0 - 100.0 fL Final  . MCH 07/22/2015 28.0  26.0 - 34.0 pg Final  . MCHC 07/22/2015 31.6  30.0 - 36.0 g/dL Final  . RDW 07/22/2015 17.5* 11.5 - 15.5 % Final  . Platelets 07/22/2015 651* 150 - 400 K/uL Final  . Sodium 07/22/2015 146* 135 - 145 mmol/L Final  . Potassium 07/22/2015 4.1  3.5 - 5.1 mmol/L Final  . Chloride 07/22/2015 115* 101 - 111 mmol/L Final  . CO2 07/22/2015 24  22 - 32 mmol/L Final  . Glucose, Bld 07/22/2015 246* 65 - 99 mg/dL Final  . BUN 07/22/2015 41* 6 - 20 mg/dL Final  . Creatinine, Ser 07/22/2015 1.27* 0.44 - 1.00 mg/dL Final   Comment: REPEATED TO VERIFY DELTA CHECK NOTED   . Calcium 07/22/2015 9.7  8.9 - 10.3 mg/dL Final  . GFR calc non Af Amer 07/22/2015 44* >60 mL/min Final  . GFR calc Af Amer 07/22/2015 51* >60 mL/min Final   Comment: (NOTE) The eGFR has been calculated using the CKD EPI equation. This calculation has not been validated in all clinical situations. eGFR's persistently <60 mL/min signify possible Chronic Kidney Disease.   . Anion gap 07/22/2015 7  5 - 15 Final  . Glucose-Capillary 07/22/2015 222* 65 - 99 mg/dL Final  . Glucose-Capillary 07/22/2015 225* 65 - 99 mg/dL Final  . Glucose-Capillary 07/22/2015 259* 65 - 99 mg/dL Final  . Glucose-Capillary 07/22/2015 176* 65 - 99 mg/dL  Final  . Glucose-Capillary 07/23/2015 107* 65 - 99 mg/dL Final  . Glucose-Capillary 07/23/2015 128* 65 - 99 mg/dL Final  . Glucose-Capillary 07/23/2015 157* 65 - 99 mg/dL Final  Ct Head Wo Contrast  08/31/2015  CLINICAL DATA:  Sepsis and acute encephalopathy. EXAM: CT HEAD WITHOUT CONTRAST TECHNIQUE: Contiguous axial images were obtained from the base of the skull through the vertex without intravenous contrast. COMPARISON:  07/20/2015 FINDINGS: Moderate to advanced atrophy is stable. Negative for hydrocephalus. Chronic white matter changes are stable. Negative for acute infarct. Negative for acute hemorrhage or mass. No edema or shift of the midline structures. Calvarium intact. IMPRESSION: Atrophy and chronic white matter changes are stable. No superimposed acute abnormality Electronically Signed   By: Franchot Gallo M.D.   On: 08/31/2015 07:38   Dg Chest Port 1 View  08/31/2015  CLINICAL DATA:  Confusion. EXAM: PORTABLE CHEST 1  VIEW COMPARISON:  07/20/2015 FINDINGS: Shallow inspiration. Normal heart size and pulmonary vascularity. No focal airspace disease or consolidation in the lungs. No blunting of costophrenic angles. No pneumothorax. Mediastinal contours appear intact. IMPRESSION: No active disease. Electronically Signed   By: Lucienne Capers M.D.   On: 08/31/2015 01:15     Assessment/Plan   ICD-9-CM ICD-10-CM   1. Hypokalemia 276.8 E87.6   2. Multiple sclerosis diagnosed 2002-not on Therapy any longer 340 G35   3. Bilateral edema of lower extremity 782.3 R60.0   4. Controlled type 2 diabetes mellitus with stage 3 chronic kidney disease, without long-term current use of insulin (HCC) 250.40 E11.22    585.3 N18.3   5. Chronic anemia 285.9 D64.9   6. Deep vein thrombosis (DVT) of left lower extremity, unspecified chronicity, unspecified vein (HCC) 453.40 I82.402   7. GERD without esophagitis 530.81 K21.9   8. Foley catheter in place V45.89 Z92.89   9. Moderate malnutrition (HCC) 263.0  E44.0   10. Essential hypertension 401.9 I10    Cont nutritional supplements as ordered  Cont current meds as ordered  PT/OT/ST as ordered  Wound care as ordered  Check CMP and CBC w diff  GOAL: short term rehab and d/c home when medically appropriate. Communicated with pt and nursing.  Will follow  Munir Victorian S. Perlie Gold  [facility] [address] [place], [place] [address] [phone] Cell (Monday-Friday 8 AM - 5 PM) [phone] After 5 PM and follow prompts

## 2015-09-11 LAB — HEPATIC FUNCTION PANEL
ALK PHOS: 43 U/L (ref 25–125)
ALT: 5 U/L — AB (ref 7–35)
AST: 6 U/L — AB (ref 13–35)
Bilirubin, Total: 0.2 mg/dL

## 2015-09-11 LAB — CBC AND DIFFERENTIAL
HCT: 25 % — AB (ref 36–46)
HEMOGLOBIN: 8.1 g/dL — AB (ref 12.0–16.0)
PLATELETS: 550 10*3/uL — AB (ref 150–399)
WBC: 12 10^3/mL

## 2015-09-11 LAB — BASIC METABOLIC PANEL
BUN: 24 mg/dL — AB (ref 4–21)
CREATININE: 0.5 mg/dL (ref 0.5–1.1)
Glucose: 116 mg/dL
POTASSIUM: 4.1 mmol/L (ref 3.4–5.3)
Sodium: 139 mmol/L (ref 137–147)

## 2015-09-30 ENCOUNTER — Non-Acute Institutional Stay (SKILLED_NURSING_FACILITY): Payer: Medicare Other | Admitting: Adult Health

## 2015-09-30 ENCOUNTER — Encounter: Payer: Self-pay | Admitting: Adult Health

## 2015-09-30 DIAGNOSIS — D72829 Elevated white blood cell count, unspecified: Secondary | ICD-10-CM | POA: Diagnosis not present

## 2015-09-30 DIAGNOSIS — Z9289 Personal history of other medical treatment: Secondary | ICD-10-CM

## 2015-09-30 DIAGNOSIS — I82402 Acute embolism and thrombosis of unspecified deep veins of left lower extremity: Secondary | ICD-10-CM | POA: Diagnosis not present

## 2015-09-30 DIAGNOSIS — D638 Anemia in other chronic diseases classified elsewhere: Secondary | ICD-10-CM

## 2015-09-30 DIAGNOSIS — N183 Chronic kidney disease, stage 3 unspecified: Secondary | ICD-10-CM

## 2015-09-30 DIAGNOSIS — E440 Moderate protein-calorie malnutrition: Secondary | ICD-10-CM

## 2015-09-30 DIAGNOSIS — Z960 Presence of urogenital implants: Secondary | ICD-10-CM

## 2015-09-30 DIAGNOSIS — E1122 Type 2 diabetes mellitus with diabetic chronic kidney disease: Secondary | ICD-10-CM

## 2015-09-30 DIAGNOSIS — R600 Localized edema: Secondary | ICD-10-CM

## 2015-09-30 DIAGNOSIS — L8994 Pressure ulcer of unspecified site, stage 4: Secondary | ICD-10-CM | POA: Diagnosis not present

## 2015-09-30 DIAGNOSIS — Z978 Presence of other specified devices: Secondary | ICD-10-CM | POA: Insufficient documentation

## 2015-09-30 NOTE — Assessment & Plan Note (Signed)
Continue Xarelto, discuss cessation date with Dr. Eulas Post

## 2015-09-30 NOTE — Assessment & Plan Note (Signed)
Continue foley at this time. Her wound is almost healed but she would like to continue the foley as she was incontinent prior to this. I told her there was risk for infection and that once her wound is healed we may do voiding trials. However, given her hx of MS she may have issues emptying.

## 2015-09-30 NOTE — Assessment & Plan Note (Deleted)
Noted in July of 2016 and started on Xarelto

## 2015-09-30 NOTE — Assessment & Plan Note (Signed)
Continue med pass and prostat, wound healing

## 2015-09-30 NOTE — Assessment & Plan Note (Signed)
Recheck CBC, used to be on iron last year which was discontinued.  Consider resuming if Hgb low, would check iron studies

## 2015-09-30 NOTE — Assessment & Plan Note (Signed)
Recheck CBC, no fever or other signs of infection

## 2015-09-30 NOTE — Assessment & Plan Note (Signed)
Continue metformin, check A1C q 6 mo

## 2015-09-30 NOTE — Assessment & Plan Note (Signed)
Improved, weight stable, continue lasix prn

## 2015-09-30 NOTE — Assessment & Plan Note (Signed)
Almost healed. continue current dressing changes

## 2015-09-30 NOTE — Progress Notes (Addendum)
Patient ID: [name], female   DOB: 04-21-1961, 55 y.o.   MRN: [id number]  Location:  [place] Room Number: [place] of Service:  SNF (31) Provider:   Cindi Carbon, [place] [phone]   Gildardo Cranker, DO  Patient Care Team: Gildardo Cranker, DO as PCP - General (Internal Medicine) [place] ([place])  Extended Emergency Contact Information Primary Emergency Contact: [place], [place] [address] [place] of [place] Phone: [phone] Mobile Phone: [phone] Relation: Relative Secondary Emergency Contact: Charmayne Sheer, [place] [address] [place] of [place] Phone: [phone] Relation: Relative  Code Status:  Full code Goals of care: Advanced Directive information Advanced Directives 09/30/2015  Does patient have an advance directive? No  Does patient want to make changes to advanced directive? No - Patient declined  Copy of advanced directive(s) in chart? -     Chief Complaint  Patient presents with  . Medical Management of Chronic Issues    Routine Visit    HPI:  Pt is a 55 y.o. female seen today for medical management of chronic diseases.  She was admitted in March after a bout of sepsis with UTI, AKI, dehydration, and sacral wound. She is non ambulatory due to MS and lives with her ex boyfriend but is left alone at times. She has had multiple hospitalizations.  She is requesting to return home but there are concerns that she can not care for herself at home.  1. 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 Bilateral edema of lower  extremity -improved per pt -has not needed lasix prn -keeps legs elevated in her bed  3. Anemia of chronic disease Lab Results  Component Value Date   HGB 8.1* 09/11/2015  -not currently on iron -h/o GIB last year after using nsaids and xarelto  4. Leukocytosis Lab Results  Component Value Date   WBC 12.0 09/11/2015  -no fever, drainage or pain   5. Malnutrition of moderate degree Lab Results  Component Value Date   LABPROT 14.8 07/05/2015  -currently on medpass and prostat   6. Controlled type 2 diabetes mellitus with stage 3 chronic kidney disease, without long-term current use of insulin ([place]) -controlled on metformin Lab Results  Component Value Date   HGBA1C 5.8* 07/20/2015    7. Chronic indwelling Foley catheter -placed due to wounds to sacrum -h/o of MS, previously incontinent  8. Sacral wound -healing with mepilex dressing   Past Medical History  Diagnosis Date  . Diabetes mellitus without complication ([place])   . Multiple sclerosis diagnosed 2002-not on Therapy any longer 06/26/2012  . Stage 4 decubitus ulcer ([place]) 07/05/2015  . Acute renal failure superimposed on stage 3 chronic kidney disease ([place]) 07/21/2015  . Malnutrition of moderate degree 07/21/2015  . UTI (lower urinary tract infection)    Past Surgical History  Procedure Laterality Date  . Hernia mesh removal    . Tubes tided    . Tubal ligation    . Esophagogastroduodenoscopy Left 01/02/2015    Procedure: ESOPHAGOGASTRODUODENOSCOPY (EGD);  Surgeon: Arta Silence, MD;  Location: [facility];  Service: Endoscopy;  Laterality: Left;    No Known Allergies    Medication List       This list is accurate as of: 09/30/15 10:23 AM.  Always use your most recent med list.               albuterol 108 (90 Base) MCG/ACT inhaler  Commonly known as:  PROVENTIL HFA;VENTOLIN HFA  Inhale 2 puffs into the lungs every 4 (four) hours as needed for wheezing or shortness of breath.     AMBULATORY NON  FORMULARY MEDICATION  Med Pass Offer 120 ml four times daily for weight loss.     APAP 325 MG tablet  Take 650 mg by mouth every 6 (six) hours as needed for pain.     baclofen 10 MG tablet  Commonly known as:  LIORESAL  Take 10 mg by mouth 3 (three) times daily.     famotidine 20 MG tablet  Commonly known as:  PEPCID  Take 1 tablet (20 mg total) by mouth 2 (two) times daily.     feeding supplement (PRO-STAT SUGAR FREE 64) Liqd  Take 30 mLs by mouth 2 (two) times daily.     furosemide 40 MG tablet  Commonly known as:  LASIX  Take 1 tablet (40 mg total) by mouth daily as needed for fluid or edema. Reported on 07/05/2015     gabapentin 100 MG capsule  Commonly known as:  NEURONTIN  Take 100 mg by mouth at bedtime.     HYDROcodone-acetaminophen 5-325 MG tablet  Commonly known as:  NORCO  Take 1 tablet by mouth every 6 (six) hours as needed for moderate pain.     lovastatin 20 MG tablet  Commonly known as:  MEVACOR  Take 20 mg by mouth daily.     metFORMIN 500 MG tablet  Commonly known as:  GLUCOPHAGE  Take 500 mg by mouth 2 (two) times daily.     multivitamin with minerals Tabs tablet  Take 1 tablet by mouth daily.     Omega 3 1000 MG Caps  Take 1,000 mg by mouth daily.     rivaroxaban 20 MG Tabs tablet  Commonly known as:  XARELTO  Take 20 mg by mouth daily with supper. Reported on 08/12/2015     solifenacin 5 MG tablet  Commonly known as:  VESICARE  Take 5 mg by mouth daily.     vitamin C 500 MG tablet  Commonly known as:  ASCORBIC ACID  Take 500 mg by mouth daily.        Review of Systems  Constitutional: Positive for appetite change (small appetite). Negative for fever, chills, diaphoresis, activity change and fatigue.  HENT: Negative for congestion.   Eyes: Negative for visual disturbance.  Respiratory: Negative for cough and shortness of breath.   Cardiovascular: Negative for chest pain, palpitations and leg swelling.  Gastrointestinal: Negative for  nausea, vomiting, abdominal pain, diarrhea, constipation, blood in stool and abdominal distention.  Genitourinary: Negative for dysuria, hematuria, flank pain and difficulty urinating.  Musculoskeletal: Positive for gait problem. Negative for back pain and arthralgias.  Skin: Positive for wound.  Neurological: Positive for weakness. Negative for dizziness and facial asymmetry.  Psychiatric/Behavioral: Negative for behavioral problems, confusion and agitation.    Immunization History  Administered Date(s) Administered  . Influenza Split 06/26/2012  . Influenza-Unspecified 04/02/2015  . PPD Test 01/04/2015, 07/09/2015, 07/23/2015, 09/02/2015  . Pneumococcal-Unspecified 03/13/2014   Pertinent  Health Maintenance Due  Topic  Date Due  . FOOT EXAM  10/19/1968  . OPHTHALMOLOGY EXAM  10/19/1968  . URINE MICROALBUMIN  10/19/1968  . PAP SMEAR  10/20/1979  . COLONOSCOPY  10/19/2008  . MAMMOGRAM  09/19/2015  . INFLUENZA VACCINE  08/01/2016 (Originally 01/12/2016)  . HEMOGLOBIN A1C  01/17/2016   No flowsheet data found. Functional Status Survey:    Filed Vitals:   09/30/15 0958  BP: 136/70  Pulse: 55  Temp: 97.3 F (36.3 C)  TempSrc: Oral  Resp: 18  Height: 5\' 6"  (1.676 m)  Weight: 134 lb 6.4 oz (60.963 kg)   Body mass index is 21.7 kg/(m^2). Physical Exam  Constitutional: She is oriented to person, place, and time. No distress.  HENT:  Head: Normocephalic and atraumatic.  Eyes: Conjunctivae are normal. Pupils are equal, round, and reactive to light. Right eye exhibits no discharge. Left eye exhibits no discharge.  Neck: Normal range of motion. Neck supple.  Cardiovascular: Normal rate, regular rhythm and normal heart sounds.   No murmur heard. Trace BLE edema, dry hyperkeratotic skin to both feet  Pulmonary/Chest: Effort normal and breath sounds normal.  Abdominal: Soft. Bowel sounds are normal.  Musculoskeletal: She exhibits no edema or tenderness.  Unable to move both legs,  full ROM to the left shoulder, some limitation with ROM to the right shoulder  Neurological: She is alert and oriented to person, place, and time.  Skin: Skin is warm and dry. She is not diaphoretic.  Wound to buttocks with 100% pink tissue, no drainage or erythema, 1 cm x 1cm   Psychiatric: She has a normal mood and affect.    Labs reviewed:  Recent Labs  12/14/14 0535  12/16/14 0436  12/21/14 0425  07/09/15 0415  07/21/15 0310  08/31/15 0030 08/31/15 0932 09/01/15 0415 09/05/15 09/11/15  NA 144  < > 136  < > 139  < > 141  < > 143  < > 149* 152* 142 143 139  K 4.1  < > 3.4*  < > 3.7  < > 3.9  < > 4.5  < > 4.1 3.7 3.3* 4.2 4.1  CL 119*  < > 112*  < > 107  < > 112*  < > 113*  < > 109 115* 112*  --   --   CO2 17*  < > 17*  < > 27  < > 22  < > 21*  < > 26 26 24   --   --   GLUCOSE 155*  < > 126*  < > 87  < > 67  < > 104*  < > 161* 132* 142*  --   --   BUN 79*  < > 13  < > 7  < > 11  < > 72*  < > 70* 61* 35* 22* 24*  CREATININE 2.71*  < > 0.86  < > 0.61  < > 0.67  < > 3.24*  < > 1.39* 1.21* 0.76 0.6 0.5  CALCIUM 8.9  < > 7.0*  < > 8.7*  < > 9.7  < > 9.2  < > 10.8* 10.4* 9.8  --   --   MG 1.7  --  1.0*  < > 1.8  --  2.0  --  2.3  --   --   --   --   --   --   PHOS 3.7  --  2.1*  --   --   --   --   --  5.0*  --   --   --   --   --   --   < > = values in this interval not displayed.  Recent Labs  08/31/15 0030 08/31/15 0932 09/01/15 0415 09/11/15  AST 19 16 14* 6*  ALT 15 13* 13* 5*  ALKPHOS 66 59 52 43  BILITOT 0.6 0.6 0.5  --   PROT 8.8* 8.3* 6.7  --   ALBUMIN 3.2* 2.9* 2.4*  --     Recent Labs  07/20/15 1712  08/31/15 0030 08/31/15 0932 09/01/15 0415 09/05/15 09/11/15  WBC 15.9*  < > 16.8* 16.5* 12.3* 11.2 12.0  NEUTROABS 11.8*  --  13.0* 12.4*  --   --   --   HGB 9.7*  < > 13.0 9.5* 8.0* 8.5* 8.1*  HCT 29.8*  < > 39.7 30.0* 25.7* 27* 25*  MCV 85.6  < > 85.9 85.7 86.2  --   --   PLT 666*  < > 384 505* 437* 471* 550*  < > = values in this interval not  displayed. Lab Results  Component Value Date   TSH 1.787 07/21/2015   Lab Results  Component Value Date   HGBA1C 5.8* 07/20/2015   Lab Results  Component Value Date   CHOL 143 07/13/2015   HDL 41 07/13/2015   LDLCALC 88 07/13/2015   TRIG 71 07/13/2015   Doppler study IMPRESSION: 1. Positive for acute appearing nonocclusive thrombus in the right lower extremity extending from the popliteal vein up to the common femoral vein. 2. Nonocclusive thrombus in the left lower extremity extending from the popliteal vein up to the common femoral vein has a more chronic and partially recanalized appearance. It would be difficult to completely exclude a small amount of acute on chronic thrombus. 3. Extensive edema in the superficial soft tissues of both calves. Calf vein evaluation is severely limited. Significant Diagnostic Results in last 30 days:  No results found.  Assessment/Plan  DVT, lower extremity (HCC) Continue Xarelto, discuss cessation date with Dr. Eulas Post  Lower extremity edema Improved, weight stable, continue lasix prn  Anemia of chronic disease Recheck CBC, used to be on iron last year which was discontinued.  Consider resuming if Hgb low, would check iron studies  Leukocytosis Recheck CBC, no fever or other signs of infection  Malnutrition of moderate degree Continue med pass and prostat, wound healing  Diabetes mellitus type 2, controlled ([place]) Continue metformin, check A1C q 6 mo  Chronic indwelling Foley catheter Continue foley at this time. Her wound is almost healed but she would like to continue the foley as she was incontinent prior to this. I told her there was risk for infection and that once her wound is healed we may do voiding trials. However, given her hx of MS she may have issues emptying.   Stage 4 decubitus ulcer ([place]) Almost healed. continue current dressing changes   ADDENDUM: Discussed anticoagulation with Dr. Eulas Post, would continue  xarelto indefinitely due to the fact that there is was an acute dvt along with a chronic dvt noted in 2016 (see HPI)  Family/ staff Communication: discussed with nurse  Labs/tests ordered:  [place], [place] [phone]

## 2015-10-16 LAB — BASIC METABOLIC PANEL
BUN: 27 mg/dL — AB (ref 4–21)
CREATININE: 0.5 mg/dL (ref 0.5–1.1)
Glucose: 97 mg/dL
POTASSIUM: 4 mmol/L (ref 3.4–5.3)
Sodium: 139 mmol/L (ref 137–147)

## 2015-10-16 LAB — CBC AND DIFFERENTIAL
HCT: 25 % — AB (ref 36–46)
HEMOGLOBIN: 7.7 g/dL — AB (ref 12.0–16.0)
PLATELETS: 475 10*3/uL — AB (ref 150–399)
WBC: 2.9 10*3/mL

## 2015-10-29 ENCOUNTER — Encounter: Payer: Self-pay | Admitting: Internal Medicine

## 2015-10-29 ENCOUNTER — Non-Acute Institutional Stay (SKILLED_NURSING_FACILITY): Payer: Medicare Other | Admitting: Internal Medicine

## 2015-10-29 DIAGNOSIS — N183 Chronic kidney disease, stage 3 unspecified: Secondary | ICD-10-CM

## 2015-10-29 DIAGNOSIS — E785 Hyperlipidemia, unspecified: Secondary | ICD-10-CM

## 2015-10-29 DIAGNOSIS — G35 Multiple sclerosis: Secondary | ICD-10-CM | POA: Diagnosis not present

## 2015-10-29 DIAGNOSIS — I10 Essential (primary) hypertension: Secondary | ICD-10-CM | POA: Diagnosis not present

## 2015-10-29 DIAGNOSIS — D638 Anemia in other chronic diseases classified elsewhere: Secondary | ICD-10-CM

## 2015-10-29 DIAGNOSIS — I82402 Acute embolism and thrombosis of unspecified deep veins of left lower extremity: Secondary | ICD-10-CM

## 2015-10-29 DIAGNOSIS — E1122 Type 2 diabetes mellitus with diabetic chronic kidney disease: Secondary | ICD-10-CM

## 2015-10-29 DIAGNOSIS — E440 Moderate protein-calorie malnutrition: Secondary | ICD-10-CM

## 2015-10-29 DIAGNOSIS — Z960 Presence of urogenital implants: Secondary | ICD-10-CM

## 2015-10-29 DIAGNOSIS — Z9289 Personal history of other medical treatment: Secondary | ICD-10-CM | POA: Diagnosis not present

## 2015-10-29 DIAGNOSIS — Z978 Presence of other specified devices: Secondary | ICD-10-CM

## 2015-10-29 NOTE — Progress Notes (Signed)
DATE: 10/29/15  Location:  [facility] Room Number: 161 A Place of Service: SNF (31)   Extended Emergency Contact Information Primary Emergency Contact: [place], [place] [address] [place] Phone: [phone] Mobile Phone: [phone] Relation: Relative Secondary Emergency Contact: Charmayne Sheer, [place] [address] [place] Phone: [phone] Relation: Relative  Advanced Directive information Does patient have an advance directive?: No FULL CODE Chief Complaint  Patient presents with  . Medical Management of Chronic Issues    Routine Visit    HPI:  55 yo female seen today for f/u. She states she is ready to go home as she is c/a her bills. No other concerns. No nursing issues. No falls. Appetite ok and she sleeps well most of the time.  GERD - stable on pepcid 20 mg twice daily   Hx DVT LLE - stable on xarelto 20 mg daily. No bleeding  Hyperlipidemia - stable on mevacor 20 mg daily and fish oil 1 gm daily. LDL 88  DM - controlled on metformin 500 mg twice daily. A1c 5.8%  Neurogenic bladder - chronic foley. Takes vesicare 5 mg daily for bladder spasms   Hx MS dx in 2002/FTT-  no longer on therapy; takes baclofen 10 mg three times daily; neurontin 100 mg nightly and norco 5/325 mg every 6 hours as needed for pain management . She gets nutritional supplements. Uses albuterol hfa prn  LE edema/HTN hx - stable on lasix 40 mg daily as needed  Pressure wounds - followed by wound care  Past Medical History  Diagnosis Date  . Diabetes mellitus without complication ([place])   . Multiple sclerosis diagnosed 2002-not on Therapy any longer 06/26/2012  . Stage 4 decubitus ulcer ([place]) 07/05/2015  . Acute renal failure superimposed on stage 3 chronic kidney disease ([place]) 07/21/2015  . Malnutrition of moderate degree 07/21/2015  . UTI (lower urinary tract infection)     Past  Surgical History  Procedure Laterality Date  . Hernia mesh removal    . Tubes tided    . Tubal ligation    . Esophagogastroduodenoscopy Left 01/02/2015    Procedure: ESOPHAGOGASTRODUODENOSCOPY (EGD);  Surgeon: Arta Silence, MD;  Location: [facility];  Service: Endoscopy;  Laterality: Left;    Patient Care Team: Gildardo Cranker, DO as PCP - General (Internal Medicine) [place] ([place])  Social History   Social History  . Marital Status: Single    Spouse Name: N/A  . Number of Children: N/A  . Years of Education: N/A   Occupational History  . Not on file.   Social History Main Topics  . Smoking status: Former Smoker -- 1.00 packs/day for 20 years    Types: Cigarettes    Quit date: 11/19/2014  . Smokeless tobacco: Never Used  . Alcohol Use: 3.0 oz/week    5 Glasses of wine per week  . Drug Use: No  . Sexual Activity: Not Currently   Other Topics Concern  . 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              reports that she quit smoking about 11 months ago. Her smoking use included Cigarettes. She has a  20 pack-year smoking history. She has never used smokeless tobacco. She reports that she drinks about 3.0 oz of alcohol per week. She reports that she does not use illicit drugs.  Immunization History  Administered Date(s) Administered  . Influenza Split 06/26/2012  . Influenza-Unspecified 04/02/2015  . PPD Test 01/04/2015, 07/09/2015, 07/23/2015, 09/02/2015  . Pneumococcal-Unspecified 03/13/2014    No Known Allergies  Medications: Patient's Medications  New Prescriptions   No medications on file  Previous Medications   ACETAMINOPHEN (APAP) 325 MG TABLET    Take 650 mg by mouth every 6 (six) hours as needed for pain.   ALBUTEROL (PROVENTIL HFA;VENTOLIN HFA) 108 (90 BASE) MCG/ACT INHALER    Inhale 2  puffs into the lungs every 4 (four) hours as needed for wheezing or shortness of breath.   AMBULATORY NON FORMULARY MEDICATION    Med Pass Offer 120 ml four times daily for weight loss.   AMINO ACIDS-PROTEIN HYDROLYS (FEEDING SUPPLEMENT, PRO-STAT SUGAR FREE 64,) LIQD    Take 30 mLs by mouth 2 (two) times daily.   BACLOFEN (LIORESAL) 10 MG TABLET    Take 10 mg by mouth 3 (three) times daily.    FAMOTIDINE (PEPCID) 20 MG TABLET    Take 1 tablet (20 mg total) by mouth 2 (two) times daily.   FUROSEMIDE (LASIX) 40 MG TABLET    Take 1 tablet (40 mg total) by mouth daily as needed for fluid or edema. Reported on 07/05/2015   GABAPENTIN (NEURONTIN) 100 MG CAPSULE    Take 100 mg by mouth at bedtime.   HYDROCODONE-ACETAMINOPHEN (NORCO) 5-325 MG TABLET    Take 1 tablet by mouth every 6 (six) hours as needed for moderate pain.   LOVASTATIN (MEVACOR) 20 MG TABLET    Take 20 mg by mouth daily.   METFORMIN (GLUCOPHAGE) 500 MG TABLET    Take 500 mg by mouth 2 (two) times daily.   MULTIPLE VITAMIN (MULTIVITAMIN WITH MINERALS) TABS TABLET    Take 1 tablet by mouth daily.   OMEGA 3 1000 MG CAPS    Take 1,000 mg by mouth daily.   RIVAROXABAN (XARELTO) 20 MG TABS TABLET    Take 20 mg by mouth daily with supper. Reported on 08/12/2015   SOLIFENACIN (VESICARE) 5 MG TABLET    Take 5 mg by mouth daily.   VITAMIN C (ASCORBIC ACID) 500 MG TABLET    Take 500 mg by mouth daily.  Modified Medications   No medications on file  Discontinued Medications   No medications on file    Review of Systems  Constitutional: Positive for appetite change.  Cardiovascular: Negative for leg swelling.  Skin: Positive for rash.  Neurological: Positive for weakness.  All other systems reviewed and are negative.   Filed Vitals:   10/29/15 1435  BP: 131/64  Pulse: 75  Temp: 98 F (36.7 C)  TempSrc: Oral  Resp: 16  Height: '5\' 6"'  (1.676 m)  Weight: 132 lb (59.875 kg)   Body mass index is 21.32 kg/(m^2).  Physical Exam    Constitutional: She is oriented to person, place, and time. She appears well-developed.  Frail appearing in NAD, sitting in w/c  HENT:  Mouth/Throat: Oropharynx is clear and moist. No oropharyngeal exudate.  Eyes: Pupils are equal, round, and reactive to light. No scleral icterus.  Neck: Neck supple. Carotid bruit is not present. No tracheal deviation present. No thyromegaly present.  Cardiovascular: Normal rate, regular rhythm and intact distal pulses.  Exam reveals no gallop and  no friction rub.   Murmur (1/6 SEM) heard. Trace LE edema b/l. No calf TTP. B/l chronic venous stasis changes  Pulmonary/Chest: Effort normal and breath sounds normal. No stridor. No respiratory distress. She has no wheezes. She has no rales.  Abdominal: Soft. Bowel sounds are normal. She exhibits no distension and no mass. There is no hepatomegaly. There is no tenderness. There is no rebound and no guarding.  Genitourinary:  Foley DTG clear yellow urine  Musculoskeletal: She exhibits edema and tenderness.  Lymphadenopathy:    She has no cervical adenopathy.  Neurological: She is alert and oriented to person, place, and time. She displays atrophy. She exhibits abnormal muscle tone.  paraplegic  Skin: Skin is warm and dry. Rash (chronic venous stasis  changes b/l LE) noted.  B/l prevalon boot intact  Psychiatric: She has a normal mood and affect. Her behavior is normal. Thought content normal.     Labs reviewed: Nursing Home on 10/29/2015  Component Date Value Ref Range Status  . Hemoglobin 10/16/2015 7.7* 12.0 - 16.0 g/dL Final  . HCT 10/16/2015 25* 36 - 46 % Final  . Platelets 10/16/2015 475* 150 - 399 K/L Final  . WBC 10/16/2015 2.9   Final  . Glucose 10/16/2015 97   Final  . BUN 10/16/2015 27* 4 - 21 mg/dL Final  . Creatinine 10/16/2015 0.5  0.5 - 1.1 mg/dL Final  . Potassium 10/16/2015 4.0  3.4 - 5.3 mmol/L Final  . Sodium 10/16/2015 139  137 - 147 mmol/L Final  Nursing Home on 09/30/2015   Component Date Value Ref Range Status  . Hemoglobin 09/11/2015 8.1* 12.0 - 16.0 g/dL Final  . HCT 09/11/2015 25* 36 - 46 % Final  . Platelets 09/11/2015 550* 150 - 399 K/L Final  . WBC 09/11/2015 12.0   Final  . Glucose 09/11/2015 116   Final  . BUN 09/11/2015 24* 4 - 21 mg/dL Final  . Creatinine 09/11/2015 0.5  0.5 - 1.1 mg/dL Final  . Potassium 09/11/2015 4.1  3.4 - 5.3 mmol/L Final  . Sodium 09/11/2015 139  137 - 147 mmol/L Final  . Alkaline Phosphatase 09/11/2015 43  25 - 125 U/L Final  . ALT 09/11/2015 5* 7 - 35 U/L Final  . AST 09/11/2015 6* 13 - 35 U/L Final  . Bilirubin, Total 09/11/2015 0.2   Final  . Hemoglobin 09/05/2015 8.5* 12.0 - 16.0 g/dL Final  . HCT 09/05/2015 27* 36 - 46 % Final  . Platelets 09/05/2015 471* 150 - 399 K/L Final  . WBC 09/05/2015 11.2   Final  . Glucose 09/05/2015 108   Final  . BUN 09/05/2015 22* 4 - 21 mg/dL Final  . Creatinine 09/05/2015 0.6  0.5 - 1.1 mg/dL Final  . Potassium 09/05/2015 4.2  3.4 - 5.3 mmol/L Final  . Sodium 09/05/2015 143  137 - 147 mmol/L Final  Admission on 08/30/2015, Discharged on 09/01/2015  Component Date Value Ref Range Status  . Lactic Acid, Venous 08/31/2015 2.18* 0.5 - 2.0 mmol/L Final  . Comment 08/31/2015 NOTIFIED PHYSICIAN   Final  . Sodium 08/31/2015 149* 135 - 145 mmol/L Final  . Potassium 08/31/2015 4.1  3.5 - 5.1 mmol/L Final  . Chloride 08/31/2015 109  101 - 111 mmol/L Final  . CO2 08/31/2015 26  22 - 32 mmol/L Final  . Glucose, Bld 08/31/2015 161* 65 - 99 mg/dL Final  . BUN 08/31/2015 70* 6 - 20 mg/dL Final  . Creatinine, Ser 08/31/2015 1.39* 0.44 -  1.00 mg/dL Final  . Calcium 08/31/2015 10.8* 8.9 - 10.3 mg/dL Final  . Total Protein 08/31/2015 8.8* 6.5 - 8.1 g/dL Final  . Albumin 08/31/2015 3.2* 3.5 - 5.0 g/dL Final  . AST 08/31/2015 19  15 - 41 U/L Final  . ALT 08/31/2015 15  14 - 54 U/L Final  . Alkaline Phosphatase 08/31/2015 66  38 - 126 U/L Final  . Total Bilirubin 08/31/2015 0.6  0.3 - 1.2 mg/dL  Final  . GFR calc non Af Amer 08/31/2015 41* >60 mL/min Final  . GFR calc Af Amer 08/31/2015 48* >60 mL/min Final   Comment: (NOTE) The eGFR has been calculated using the CKD EPI equation. This calculation has not been validated in all clinical situations. eGFR's persistently <60 mL/min signify possible Chronic Kidney Disease.   . Anion gap 08/31/2015 14  5 - 15 Final  . Specimen Description 08/31/2015 BLOOD RIGHT ANTECUBITAL   Final  . Special Requests 08/31/2015 BOTTLES DRAWN AEROBIC ONLY 5CC   Final  . Culture 08/31/2015 NO GROWTH 5 DAYS   Final  . Report Status 08/31/2015 09/05/2015 FINAL   Final  . Specimen Description 08/31/2015 BLOOD RIGHT HAND   Final  . Special Requests 08/31/2015 BOTTLES DRAWN AEROBIC ONLY 5CC   Final  . Culture 08/31/2015 NO GROWTH 5 DAYS   Final  . Report Status 08/31/2015 09/05/2015 FINAL   Final  . Color, Urine 08/30/2015 YELLOW  YELLOW Final  . APPearance 08/30/2015 TURBID* CLEAR Final  . Specific Gravity, Urine 08/30/2015 1.014  1.005 - 1.030 Final  . pH 08/30/2015 8.0  5.0 - 8.0 Final  . Glucose, UA 08/30/2015 NEGATIVE  NEGATIVE mg/dL Final  . Hgb urine dipstick 08/30/2015 LARGE* NEGATIVE Final  . Bilirubin Urine 08/30/2015 NEGATIVE  NEGATIVE Final  . Ketones, ur 08/30/2015 NEGATIVE  NEGATIVE mg/dL Final  . Protein, ur 08/30/2015 100* NEGATIVE mg/dL Final  . Nitrite 08/30/2015 POSITIVE* NEGATIVE Final  . Leukocytes, UA 08/30/2015 LARGE* NEGATIVE Final  . Specimen Description 08/30/2015 URINE, CATHETERIZED   Final  . Special Requests 08/30/2015 NONE   Final  . Culture 08/30/2015 >=100,000 COLONIES/mL PROTEUS MIRABILIS   Final  . Report Status 08/30/2015 09/02/2015 FINAL   Final  . Organism ID, Bacteria 08/30/2015 PROTEUS MIRABILIS   Final  . WBC 08/31/2015 16.8* 4.0 - 10.5 K/uL Final  . RBC 08/31/2015 4.62  3.87 - 5.11 MIL/uL Final  . Hemoglobin 08/31/2015 13.0  12.0 - 15.0 g/dL Final  . HCT 08/31/2015 39.7  36.0 - 46.0 % Final  . MCV 08/31/2015  85.9  78.0 - 100.0 fL Final  . MCH 08/31/2015 28.1  26.0 - 34.0 pg Final  . MCHC 08/31/2015 32.7  30.0 - 36.0 g/dL Final  . RDW 08/31/2015 19.2* 11.5 - 15.5 % Final  . Platelets 08/31/2015 384  150 - 400 K/uL Final  . Neutrophils Relative % 08/31/2015 77   Final  . Neutro Abs 08/31/2015 13.0* 1.7 - 7.7 K/uL Final  . Lymphocytes Relative 08/31/2015 15   Final  . Lymphs Abs 08/31/2015 2.5  0.7 - 4.0 K/uL Final  . Monocytes Relative 08/31/2015 7   Final  . Monocytes Absolute 08/31/2015 1.3* 0.1 - 1.0 K/uL Final  . Eosinophils Relative 08/31/2015 1   Final  . Eosinophils Absolute 08/31/2015 0.1  0.0 - 0.7 K/uL Final  . Basophils Relative 08/31/2015 0   Final  . Basophils Absolute 08/31/2015 0.1  0.0 - 0.1 K/uL Final  . Squamous Epithelial / LPF 08/30/2015 0-5*  NONE SEEN Final  . WBC, UA 08/30/2015 TOO NUMEROUS TO COUNT  0 - 5 WBC/hpf Final  . RBC / HPF 08/30/2015 0-5  0 - 5 RBC/hpf Final  . Bacteria, UA 08/30/2015 MANY* NONE SEEN Final  . Crystals 08/30/2015 TRIPLE PHOSPHATE CRYSTALS* NEGATIVE Final  . Lactic Acid, Venous 08/31/2015 1.51  0.5 - 2.0 mmol/L Final  . MRSA by PCR 08/31/2015 NEGATIVE  NEGATIVE Final   Comment:        The GeneXpert MRSA Assay (FDA approved for NASAL specimens only), is one component of a comprehensive MRSA colonization surveillance program. It is not intended to diagnose MRSA infection nor to guide or monitor treatment for MRSA infections.   . WBC 08/31/2015 16.5* 4.0 - 10.5 K/uL Final  . RBC 08/31/2015 3.50* 3.87 - 5.11 MIL/uL Final  . Hemoglobin 08/31/2015 9.5* 12.0 - 15.0 g/dL Final   REPEATED TO VERIFY  . HCT 08/31/2015 30.0* 36.0 - 46.0 % Final  . MCV 08/31/2015 85.7  78.0 - 100.0 fL Final  . MCH 08/31/2015 27.1  26.0 - 34.0 pg Final  . MCHC 08/31/2015 31.7  30.0 - 36.0 g/dL Final  . RDW 08/31/2015 19.0* 11.5 - 15.5 % Final  . Platelets 08/31/2015 505* 150 - 400 K/uL Final   PLATELET COUNT CONFIRMED BY SMEAR  . Neutrophils Relative % 08/31/2015  75   Final  . Lymphocytes Relative 08/31/2015 16   Final  . Monocytes Relative 08/31/2015 8   Final  . Eosinophils Relative 08/31/2015 1   Final  . Basophils Relative 08/31/2015 0   Final  . Neutro Abs 08/31/2015 12.4* 1.7 - 7.7 K/uL Final  . Lymphs Abs 08/31/2015 2.6  0.7 - 4.0 K/uL Final  . Monocytes Absolute 08/31/2015 1.3* 0.1 - 1.0 K/uL Final  . Eosinophils Absolute 08/31/2015 0.2  0.0 - 0.7 K/uL Final  . Basophils Absolute 08/31/2015 0.0  0.0 - 0.1 K/uL Final  . RBC Morphology 08/31/2015 POLYCHROMASIA PRESENT   Final  . WBC Morphology 08/31/2015 ATYPICAL LYMPHOCYTES   Final  . Sodium 08/31/2015 152* 135 - 145 mmol/L Final  . Potassium 08/31/2015 3.7  3.5 - 5.1 mmol/L Final  . Chloride 08/31/2015 115* 101 - 111 mmol/L Final  . CO2 08/31/2015 26  22 - 32 mmol/L Final  . Glucose, Bld 08/31/2015 132* 65 - 99 mg/dL Final  . BUN 08/31/2015 61* 6 - 20 mg/dL Final  . Creatinine, Ser 08/31/2015 1.21* 0.44 - 1.00 mg/dL Final  . Calcium 08/31/2015 10.4* 8.9 - 10.3 mg/dL Final  . Total Protein 08/31/2015 8.3* 6.5 - 8.1 g/dL Final  . Albumin 08/31/2015 2.9* 3.5 - 5.0 g/dL Final  . AST 08/31/2015 16  15 - 41 U/L Final  . ALT 08/31/2015 13* 14 - 54 U/L Final  . Alkaline Phosphatase 08/31/2015 59  38 - 126 U/L Final  . Total Bilirubin 08/31/2015 0.6  0.3 - 1.2 mg/dL Final  . GFR calc non Af Amer 08/31/2015 49* >60 mL/min Final  . GFR calc Af Amer 08/31/2015 57* >60 mL/min Final   Comment: (NOTE) The eGFR has been calculated using the CKD EPI equation. This calculation has not been validated in all clinical situations. eGFR's persistently <60 mL/min signify possible Chronic Kidney Disease.   . Anion gap 08/31/2015 11  5 - 15 Final  . Lactic Acid, Venous 08/31/2015 2.6* 0.5 - 2.0 mmol/L Final   Comment: CRITICAL RESULT CALLED TO, READ BACK BY AND VERIFIED WITH: [email or web address],RN [id number] 08/31/15 CLARK,S   .  Lactic Acid, Venous 08/31/2015 3.0* 0.5 - 2.0 mmol/L Final   Comment: CRITICAL RESULT  CALLED TO, READ BACK BY AND VERIFIED WITH: T.DUONG,RN 1034 08/31/15 CLARK,S   . Procalcitonin 08/31/2015 5.50   Final   Comment:        Interpretation: PCT > 2 ng/mL: Systemic infection (sepsis) is likely, unless other causes are known. (NOTE)         ICU PCT Algorithm               Non ICU PCT Algorithm    ----------------------------     ------------------------------         PCT < 0.25 ng/mL                 PCT < 0.1 ng/mL     Stopping of antibiotics            Stopping of antibiotics       strongly encouraged.               strongly encouraged.    ----------------------------     ------------------------------       PCT level decrease by               PCT < 0.25 ng/mL       >= 80% from peak PCT       OR PCT 0.25 - 0.5 ng/mL          Stopping of antibiotics                                             encouraged.     Stopping of antibiotics           encouraged.    ----------------------------     ------------------------------       PCT level decrease by              PCT >= 0.25 ng/mL       < 80% from peak PCT        AND PCT >= 0.5 ng/mL            Continuing antibiotics                                                                        encouraged.       Continuing antibiotics            encouraged.    ----------------------------     ------------------------------     PCT level increase compared          PCT > 0.5 ng/mL         with peak PCT AND          PCT >= 0.5 ng/mL             Escalation of antibiotics                                          strongly encouraged.      Escalation of antibiotics  strongly encouraged.   . Glucose-Capillary 08/31/2015 114* 65 - 99 mg/dL Final  . Glucose-Capillary 08/31/2015 122* 65 - 99 mg/dL Final  . Comment 1 08/31/2015 Notify RN   Final  . WBC 09/01/2015 12.3* 4.0 - 10.5 K/uL Final  . RBC 09/01/2015 2.98* 3.87 - 5.11 MIL/uL Final  . Hemoglobin 09/01/2015 8.0* 12.0 - 15.0 g/dL Final  . HCT 09/01/2015 25.7* 36.0 - 46.0 %  Final  . MCV 09/01/2015 86.2  78.0 - 100.0 fL Final  . MCH 09/01/2015 26.8  26.0 - 34.0 pg Final  . MCHC 09/01/2015 31.1  30.0 - 36.0 g/dL Final  . RDW 09/01/2015 18.8* 11.5 - 15.5 % Final  . Platelets 09/01/2015 437* 150 - 400 K/uL Final  . Sodium 09/01/2015 142  135 - 145 mmol/L Final   DELTA CHECK NOTED  . Potassium 09/01/2015 3.3* 3.5 - 5.1 mmol/L Final  . Chloride 09/01/2015 112* 101 - 111 mmol/L Final  . CO2 09/01/2015 24  22 - 32 mmol/L Final  . Glucose, Bld 09/01/2015 142* 65 - 99 mg/dL Final  . BUN 09/01/2015 35* 6 - 20 mg/dL Final  . Creatinine, Ser 09/01/2015 0.76  0.44 - 1.00 mg/dL Final  . Calcium 09/01/2015 9.8  8.9 - 10.3 mg/dL Final  . Total Protein 09/01/2015 6.7  6.5 - 8.1 g/dL Final  . Albumin 09/01/2015 2.4* 3.5 - 5.0 g/dL Final  . AST 09/01/2015 14* 15 - 41 U/L Final  . ALT 09/01/2015 13* 14 - 54 U/L Final  . Alkaline Phosphatase 09/01/2015 52  38 - 126 U/L Final  . Total Bilirubin 09/01/2015 0.5  0.3 - 1.2 mg/dL Final  . GFR calc non Af Amer 09/01/2015 >60  >60 mL/min Final  . GFR calc Af Amer 09/01/2015 >60  >60 mL/min Final   Comment: (NOTE) The eGFR has been calculated using the CKD EPI equation. This calculation has not been validated in all clinical situations. eGFR's persistently <60 mL/min signify possible Chronic Kidney Disease.   . Anion gap 09/01/2015 6  5 - 15 Final  . Glucose-Capillary 08/31/2015 114* 65 - 99 mg/dL Final  . Glucose-Capillary 08/31/2015 142* 65 - 99 mg/dL Final  . Glucose-Capillary 09/01/2015 135* 65 - 99 mg/dL Final  . Glucose-Capillary 09/01/2015 268* 65 - 99 mg/dL Final  Nursing Home on 08/12/2015  Component Date Value Ref Range Status  . Glucose 08/01/2015 103   Final  . BUN 08/01/2015 24* 4 - 21 mg/dL Final  . Creatinine 08/01/2015 0.5  0.5 - 1.1 mg/dL Final  . Potassium 08/01/2015 4.0  3.4 - 5.3 mmol/L Final  . Sodium 08/01/2015 138  137 - 147 mmol/L Final  . Hemoglobin 07/15/2015 8.9* 12.0 - 16.0 g/dL Final  . HCT  07/15/2015 28* 36 - 46 % Final  . Platelets 07/15/2015 541* 150 - 399 K/L Final  . WBC 07/15/2015 12.9   Final  . Glucose 07/15/2015 118   Final  . BUN 07/15/2015 19  4 - 21 mg/dL Final  . Creatinine 07/15/2015 0.5  0.5 - 1.1 mg/dL Final  . Potassium 07/15/2015 4.0  3.4 - 5.3 mmol/L Final  . Sodium 07/15/2015 137  137 - 147 mmol/L Final  . Glucose 07/13/2015 145   Final  . BUN 07/13/2015 15  4 - 21 mg/dL Final  . Creatinine 07/13/2015 0.5  0.5 - 1.1 mg/dL Final  . Potassium 07/13/2015 3.9  3.4 - 5.3 mmol/L Final  . Sodium 07/13/2015 135* 137 - 147 mmol/L Final  .  Triglycerides 07/13/2015 71  40 - 160 mg/dL Final  . Cholesterol 07/13/2015 143  0 - 200 mg/dL Final  . HDL 07/13/2015 41  35 - 70 mg/dL Final  . LDL Cholesterol 07/13/2015 88   Final  . Alkaline Phosphatase 07/13/2015 46  25 - 125 U/L Final  . ALT 07/13/2015 12  7 - 35 U/L Final  . AST 07/13/2015 12* 13 - 35 U/L Final  . Bilirubin, Total 07/13/2015 0.4   Final    No results found.   Assessment/Plan   ICD-9-CM ICD-10-CM   1. Multiple sclerosis diagnosed 2002-not on Therapy any longer 340 G35   2. Malnutrition of moderate degree 263.0 E44.0   3. Deep vein thrombosis (DVT) of left lower extremity, unspecified chronicity, unspecified vein (HCC) 453.40 I82.402   4. Controlled type 2 diabetes mellitus with stage 3 chronic kidney disease, without long-term current use of insulin (HCC) 250.40 E11.22    585.3 N18.3   5. Chronic indwelling Foley catheter V45.89 Z92.89   6. Anemia of chronic disease 285.29 D63.8   7. Essential hypertension 401.9 I10   8. Hyperlipidemia 272.4 E78.5    Cont current meds as ordered  PT/OT/ST as ordered  Aspiration precautions  Foley cath care as indicated  Cont wound care as indicated  Will follow  Valeriano Bain S. Perlie Gold  [facility] [address] [place], [place] [address] [phone] Cell (Monday-Friday 8 AM - 5 PM) [phone]  After 5 PM and follow prompts

## 2015-11-11 ENCOUNTER — Inpatient Hospital Stay (HOSPITAL_COMMUNITY)
Admission: EM | Admit: 2015-11-11 | Discharge: 2015-11-18 | DRG: 698 | Disposition: A | Discharge status: Home Health | Payer: Medicare Other | Attending: Internal Medicine | Admitting: Internal Medicine

## 2015-11-11 ENCOUNTER — Encounter (HOSPITAL_COMMUNITY): Payer: Self-pay

## 2015-11-11 ENCOUNTER — Inpatient Hospital Stay (HOSPITAL_COMMUNITY): Payer: Medicare Other

## 2015-11-11 DIAGNOSIS — N319 Neuromuscular dysfunction of bladder, unspecified: Secondary | ICD-10-CM | POA: Diagnosis present

## 2015-11-11 DIAGNOSIS — K5900 Constipation, unspecified: Secondary | ICD-10-CM | POA: Diagnosis present

## 2015-11-11 DIAGNOSIS — G9340 Encephalopathy, unspecified: Secondary | ICD-10-CM | POA: Diagnosis present

## 2015-11-11 DIAGNOSIS — R5381 Other malaise: Secondary | ICD-10-CM | POA: Diagnosis present

## 2015-11-11 DIAGNOSIS — B957 Other staphylococcus as the cause of diseases classified elsewhere: Secondary | ICD-10-CM | POA: Diagnosis present

## 2015-11-11 DIAGNOSIS — E440 Moderate protein-calorie malnutrition: Secondary | ICD-10-CM | POA: Diagnosis present

## 2015-11-11 DIAGNOSIS — A419 Sepsis, unspecified organism: Secondary | ICD-10-CM | POA: Diagnosis present

## 2015-11-11 DIAGNOSIS — Z833 Family history of diabetes mellitus: Secondary | ICD-10-CM | POA: Diagnosis not present

## 2015-11-11 DIAGNOSIS — E785 Hyperlipidemia, unspecified: Secondary | ICD-10-CM | POA: Diagnosis present

## 2015-11-11 DIAGNOSIS — Z978 Presence of other specified devices: Secondary | ICD-10-CM

## 2015-11-11 DIAGNOSIS — R509 Fever, unspecified: Secondary | ICD-10-CM | POA: Diagnosis not present

## 2015-11-11 DIAGNOSIS — Z87891 Personal history of nicotine dependence: Secondary | ICD-10-CM

## 2015-11-11 DIAGNOSIS — E1122 Type 2 diabetes mellitus with diabetic chronic kidney disease: Secondary | ICD-10-CM | POA: Diagnosis present

## 2015-11-11 DIAGNOSIS — N390 Urinary tract infection, site not specified: Secondary | ICD-10-CM | POA: Diagnosis present

## 2015-11-11 DIAGNOSIS — R600 Localized edema: Secondary | ICD-10-CM | POA: Diagnosis present

## 2015-11-11 DIAGNOSIS — E119 Type 2 diabetes mellitus without complications: Secondary | ICD-10-CM

## 2015-11-11 DIAGNOSIS — Z79899 Other long term (current) drug therapy: Secondary | ICD-10-CM

## 2015-11-11 DIAGNOSIS — N10 Acute tubulo-interstitial nephritis: Secondary | ICD-10-CM | POA: Diagnosis present

## 2015-11-11 DIAGNOSIS — R6520 Severe sepsis without septic shock: Secondary | ICD-10-CM | POA: Diagnosis not present

## 2015-11-11 DIAGNOSIS — Z86718 Personal history of other venous thrombosis and embolism: Secondary | ICD-10-CM

## 2015-11-11 DIAGNOSIS — Z7901 Long term (current) use of anticoagulants: Secondary | ICD-10-CM | POA: Diagnosis not present

## 2015-11-11 DIAGNOSIS — D649 Anemia, unspecified: Secondary | ICD-10-CM | POA: Diagnosis present

## 2015-11-11 DIAGNOSIS — N183 Chronic kidney disease, stage 3 (moderate): Secondary | ICD-10-CM | POA: Diagnosis present

## 2015-11-11 DIAGNOSIS — G35 Multiple sclerosis: Secondary | ICD-10-CM | POA: Diagnosis present

## 2015-11-11 DIAGNOSIS — Z960 Presence of urogenital implants: Secondary | ICD-10-CM

## 2015-11-11 DIAGNOSIS — L8990 Pressure ulcer of unspecified site, unspecified stage: Secondary | ICD-10-CM | POA: Diagnosis not present

## 2015-11-11 DIAGNOSIS — Z9289 Personal history of other medical treatment: Secondary | ICD-10-CM | POA: Diagnosis not present

## 2015-11-11 DIAGNOSIS — E118 Type 2 diabetes mellitus with unspecified complications: Secondary | ICD-10-CM | POA: Diagnosis not present

## 2015-11-11 DIAGNOSIS — L89314 Pressure ulcer of right buttock, stage 4: Secondary | ICD-10-CM | POA: Diagnosis present

## 2015-11-11 DIAGNOSIS — Z7401 Bed confinement status: Secondary | ICD-10-CM

## 2015-11-11 DIAGNOSIS — N12 Tubulo-interstitial nephritis, not specified as acute or chronic: Secondary | ICD-10-CM | POA: Diagnosis present

## 2015-11-11 DIAGNOSIS — Y846 Urinary catheterization as the cause of abnormal reaction of the patient, or of later complication, without mention of misadventure at the time of the procedure: Secondary | ICD-10-CM | POA: Diagnosis present

## 2015-11-11 DIAGNOSIS — E46 Unspecified protein-calorie malnutrition: Secondary | ICD-10-CM | POA: Diagnosis present

## 2015-11-11 DIAGNOSIS — Z66 Do not resuscitate: Secondary | ICD-10-CM | POA: Diagnosis not present

## 2015-11-11 DIAGNOSIS — Z7984 Long term (current) use of oral hypoglycemic drugs: Secondary | ICD-10-CM | POA: Diagnosis not present

## 2015-11-11 DIAGNOSIS — T83518A Infection and inflammatory reaction due to other urinary catheter, initial encounter: Secondary | ICD-10-CM | POA: Diagnosis present

## 2015-11-11 DIAGNOSIS — N179 Acute kidney failure, unspecified: Secondary | ICD-10-CM | POA: Diagnosis present

## 2015-11-11 DIAGNOSIS — L89324 Pressure ulcer of left buttock, stage 4: Secondary | ICD-10-CM | POA: Diagnosis present

## 2015-11-11 DIAGNOSIS — E876 Hypokalemia: Secondary | ICD-10-CM | POA: Diagnosis present

## 2015-11-11 HISTORY — DX: Acute embolism and thrombosis of unspecified deep veins of unspecified lower extremity: I82.409

## 2015-11-11 LAB — CBC WITH DIFFERENTIAL/PLATELET
BASOS PCT: 0 %
Basophils Absolute: 0 10*3/uL (ref 0.0–0.1)
EOS PCT: 0 %
Eosinophils Absolute: 0 10*3/uL (ref 0.0–0.7)
HEMATOCRIT: 29.7 % — AB (ref 36.0–46.0)
Hemoglobin: 9.4 g/dL — ABNORMAL LOW (ref 12.0–15.0)
LYMPHS ABS: 1.3 10*3/uL (ref 0.7–4.0)
Lymphocytes Relative: 5 %
MCH: 27.5 pg (ref 26.0–34.0)
MCHC: 31.6 g/dL (ref 30.0–36.0)
MCV: 86.8 fL (ref 78.0–100.0)
MONO ABS: 1.1 10*3/uL — AB (ref 0.1–1.0)
MONOS PCT: 4 %
NEUTROS ABS: 24.4 10*3/uL — AB (ref 1.7–7.7)
Neutrophils Relative %: 91 %
PLATELETS: 478 10*3/uL — AB (ref 150–400)
RBC: 3.42 MIL/uL — AB (ref 3.87–5.11)
RDW: 18.7 % — AB (ref 11.5–15.5)
WBC: 26.8 10*3/uL — AB (ref 4.0–10.5)

## 2015-11-11 LAB — I-STAT CG4 LACTIC ACID, ED
LACTIC ACID, VENOUS: 3.36 mmol/L — AB (ref 0.5–2.0)
LACTIC ACID, VENOUS: 5.42 mmol/L — AB (ref 0.5–2.0)

## 2015-11-11 LAB — URINALYSIS, ROUTINE W REFLEX MICROSCOPIC
BILIRUBIN URINE: NEGATIVE
GLUCOSE, UA: NEGATIVE mg/dL
KETONES UR: NEGATIVE mg/dL
NITRITE: POSITIVE — AB
PH: 7 (ref 5.0–8.0)
PROTEIN: 30 mg/dL — AB
Specific Gravity, Urine: 1.017 (ref 1.005–1.030)

## 2015-11-11 LAB — PROTIME-INR
INR: 1.86 — ABNORMAL HIGH (ref 0.00–1.49)
Prothrombin Time: 21.4 seconds — ABNORMAL HIGH (ref 11.6–15.2)

## 2015-11-11 LAB — COMPREHENSIVE METABOLIC PANEL
ALBUMIN: 2.9 g/dL — AB (ref 3.5–5.0)
ALT: 11 U/L — AB (ref 14–54)
AST: 14 U/L — AB (ref 15–41)
Alkaline Phosphatase: 45 U/L (ref 38–126)
Anion gap: 10 (ref 5–15)
BUN: 53 mg/dL — AB (ref 6–20)
CHLORIDE: 111 mmol/L (ref 101–111)
CO2: 20 mmol/L — AB (ref 22–32)
CREATININE: 1.39 mg/dL — AB (ref 0.44–1.00)
Calcium: 10.4 mg/dL — ABNORMAL HIGH (ref 8.9–10.3)
GFR calc Af Amer: 48 mL/min — ABNORMAL LOW (ref >60)
GFR, EST NON AFRICAN AMERICAN: 41 mL/min — AB (ref >60)
GLUCOSE: 166 mg/dL — AB (ref 65–99)
POTASSIUM: 4.2 mmol/L (ref 3.5–5.1)
Sodium: 141 mmol/L (ref 135–145)
Total Bilirubin: 0.5 mg/dL (ref 0.3–1.2)
Total Protein: 7.3 g/dL (ref 6.5–8.1)

## 2015-11-11 LAB — URINE MICROSCOPIC-ADD ON

## 2015-11-11 LAB — MRSA PCR SCREENING: MRSA by PCR: NEGATIVE

## 2015-11-11 LAB — APTT: APTT: 41 s — AB (ref 24–37)

## 2015-11-11 LAB — LACTIC ACID, PLASMA
LACTIC ACID, VENOUS: 5.5 mmol/L — AB (ref 0.5–2.0)
Lactic Acid, Venous: 4.8 mmol/L (ref 0.5–2.0)

## 2015-11-11 LAB — MAGNESIUM: MAGNESIUM: 1.7 mg/dL (ref 1.7–2.4)

## 2015-11-11 LAB — CREATININE, SERUM
CREATININE: 1.27 mg/dL — AB (ref 0.44–1.00)
GFR, EST AFRICAN AMERICAN: 53 mL/min — AB (ref >60)
GFR, EST NON AFRICAN AMERICAN: 46 mL/min — AB (ref >60)

## 2015-11-11 LAB — GLUCOSE, CAPILLARY
GLUCOSE-CAPILLARY: 163 mg/dL — AB (ref 65–99)
Glucose-Capillary: 131 mg/dL — ABNORMAL HIGH (ref 65–99)

## 2015-11-11 LAB — TROPONIN I: Troponin I: <0.03 ng/mL (ref <0.031)

## 2015-11-11 LAB — PROCALCITONIN: Procalcitonin: 61.49 ng/mL

## 2015-11-11 LAB — PHOSPHORUS: Phosphorus: 4.3 mg/dL (ref 2.5–4.6)

## 2015-11-11 MED ORDER — VANCOMYCIN HCL 500 MG IV SOLR
500.0000 mg | Freq: Two times a day (BID) | INTRAVENOUS | Status: DC
  Administered 2015-11-11 – 2015-11-12 (×2): 500 mg via INTRAVENOUS
  Filled 2015-11-11 (×4): qty 500

## 2015-11-11 MED ORDER — DARIFENACIN HYDROBROMIDE ER 7.5 MG PO TB24
7.5000 mg | ORAL_TABLET | Freq: Every day | ORAL | Status: DC
  Administered 2015-11-11 – 2015-11-18 (×8): 7.5 mg via ORAL
  Filled 2015-11-11 (×8): qty 1

## 2015-11-11 MED ORDER — ONDANSETRON HCL 4 MG/2ML IJ SOLN: 4.0000 mg | Freq: Four times a day (QID) | INTRAMUSCULAR | Status: DC | PRN

## 2015-11-11 MED ORDER — INSULIN ASPART 100 UNIT/ML ~~LOC~~ SOLN
0.0000 [IU] | Freq: Three times a day (TID) | SUBCUTANEOUS | Status: DC
  Administered 2015-11-11 – 2015-11-12 (×3): 1 [IU] via SUBCUTANEOUS
  Administered 2015-11-13: 3 [IU] via SUBCUTANEOUS
  Administered 2015-11-13: 5 [IU] via SUBCUTANEOUS
  Administered 2015-11-14: 2 [IU] via SUBCUTANEOUS
  Administered 2015-11-14 (×2): 1 [IU] via SUBCUTANEOUS
  Administered 2015-11-15: 3 [IU] via SUBCUTANEOUS
  Administered 2015-11-15: 2 [IU] via SUBCUTANEOUS
  Administered 2015-11-15: 1 [IU] via SUBCUTANEOUS
  Administered 2015-11-16 (×2): 2 [IU] via SUBCUTANEOUS
  Administered 2015-11-17 – 2015-11-18 (×2): 3 [IU] via SUBCUTANEOUS
  Administered 2015-11-18: 2 [IU] via SUBCUTANEOUS

## 2015-11-11 MED ORDER — BACLOFEN 10 MG PO TABS
10.0000 mg | ORAL_TABLET | Freq: Three times a day (TID) | ORAL | Status: DC
  Administered 2015-11-11 – 2015-11-18 (×22): 10 mg via ORAL
  Filled 2015-11-11 (×22): qty 1

## 2015-11-11 MED ORDER — ONDANSETRON HCL 4 MG PO TABS: 4.0000 mg | ORAL_TABLET | Freq: Four times a day (QID) | ORAL | Status: DC | PRN

## 2015-11-11 MED ORDER — PIPERACILLIN-TAZOBACTAM 3.375 G IVPB
3.3750 g | Freq: Three times a day (TID) | INTRAVENOUS | Status: DC
  Administered 2015-11-11 – 2015-11-15 (×12): 3.375 g via INTRAVENOUS
  Filled 2015-11-11 (×14): qty 50

## 2015-11-11 MED ORDER — RIVAROXABAN 20 MG PO TABS
20.0000 mg | ORAL_TABLET | Freq: Every day | ORAL | Status: DC
  Administered 2015-11-11 – 2015-11-18 (×8): 20 mg via ORAL
  Filled 2015-11-11 (×8): qty 1

## 2015-11-11 MED ORDER — SODIUM CHLORIDE 0.9 % IV BOLUS (SEPSIS)
1000.0000 mL | Freq: Once | INTRAVENOUS | Status: AC
  Administered 2015-11-11: 1000 mL via INTRAVENOUS

## 2015-11-11 MED ORDER — MORPHINE SULFATE (PF) 2 MG/ML IV SOLN: 2.0000 mg | INTRAVENOUS | Status: DC | PRN

## 2015-11-11 MED ORDER — PRO-STAT SUGAR FREE PO LIQD
30.0000 mL | Freq: Two times a day (BID) | ORAL | Status: DC
  Administered 2015-11-11 – 2015-11-18 (×14): 30 mL via ORAL
  Filled 2015-11-11 (×14): qty 30

## 2015-11-11 MED ORDER — VANCOMYCIN HCL IN DEXTROSE 1-5 GM/200ML-% IV SOLN: 1000.0000 mg | Freq: Once | INTRAVENOUS | Status: DC

## 2015-11-11 MED ORDER — SODIUM CHLORIDE 0.9 % IV SOLN
INTRAVENOUS | Status: DC
  Administered 2015-11-11 – 2015-11-13 (×7): via INTRAVENOUS
  Administered 2015-11-14: 1000 mL via INTRAVENOUS
  Administered 2015-11-14: 05:00:00 via INTRAVENOUS
  Administered 2015-11-15: 1000 mL via INTRAVENOUS
  Administered 2015-11-15 (×2): via INTRAVENOUS

## 2015-11-11 MED ORDER — INSULIN ASPART 100 UNIT/ML ~~LOC~~ SOLN
0.0000 [IU] | Freq: Every day | SUBCUTANEOUS | Status: DC
  Administered 2015-11-13: 2 [IU] via SUBCUTANEOUS

## 2015-11-11 MED ORDER — GABAPENTIN 100 MG PO CAPS
100.0000 mg | ORAL_CAPSULE | Freq: Every day | ORAL | Status: DC
  Administered 2015-11-11 – 2015-11-17 (×7): 100 mg via ORAL
  Filled 2015-11-11 (×7): qty 1

## 2015-11-11 MED ORDER — VANCOMYCIN HCL 10 G IV SOLR
1250.0000 mg | Freq: Once | INTRAVENOUS | Status: AC
  Administered 2015-11-11: 1250 mg via INTRAVENOUS
  Filled 2015-11-11: qty 1250

## 2015-11-11 MED ORDER — PIPERACILLIN-TAZOBACTAM 3.375 G IVPB 30 MIN
3.3750 g | Freq: Once | INTRAVENOUS | Status: AC
  Administered 2015-11-11: 3.375 g via INTRAVENOUS
  Filled 2015-11-11: qty 50

## 2015-11-11 MED ORDER — SODIUM CHLORIDE 0.9% FLUSH
3.0000 mL | Freq: Two times a day (BID) | INTRAVENOUS | Status: DC
  Administered 2015-11-11 – 2015-11-18 (×13): 3 mL via INTRAVENOUS

## 2015-11-11 MED ORDER — ALBUTEROL SULFATE (2.5 MG/3ML) 0.083% IN NEBU: 3.0000 mL | INHALATION_SOLUTION | RESPIRATORY_TRACT | Status: DC | PRN

## 2015-11-11 MED ORDER — PRAVASTATIN SODIUM 10 MG PO TABS
20.0000 mg | ORAL_TABLET | Freq: Every day | ORAL | Status: DC
  Administered 2015-11-11 – 2015-11-18 (×8): 20 mg via ORAL
  Filled 2015-11-11 (×3): qty 1
  Filled 2015-11-11: qty 2
  Filled 2015-11-11 (×2): qty 1
  Filled 2015-11-11: qty 2
  Filled 2015-11-11: qty 1

## 2015-11-11 NOTE — Progress Notes (Addendum)
Pharmacy Antibiotic Note  [name] is a 55 y.o. female admitted on 11/11/2015 with sepsis.  Pharmacy has been consulted for vancomycin/Zosyn dosing.  Admitted with AMS, Tmax 100.77F. BP 87/59. CMet, CBC, lactate, and UA ordered. Ordered first doses of vancomycin 1000 mg x 1 and Zosyn 3.375g x 1.   Plan: Vancomycin increased to 1250 mg x 1  F/u CMet for renal function and additional abx doses F/u cx, s/sx clinical improvement     Temp (24hrs), Avg:100.7 F (38.2 C), Min:100.7 F (38.2 C), Max:100.7 F (38.2 C)  No results for input(s): WBC, CREATININE, LATICACIDVEN, VANCOTROUGH, VANCOPEAK, VANCORANDOM, GENTTROUGH, GENTPEAK, GENTRANDOM, TOBRATROUGH, TOBRAPEAK, TOBRARND, AMIKACINPEAK, AMIKACINTROU, AMIKACIN in the last 168 hours.  CrCl cannot be calculated (Patient has no serum creatinine result on file.).    No Known Allergies  Antimicrobials this admission: 5/31 vancomycin >>  5/31 Zosyn >>   Dose adjustments this admission: n/a  Microbiology results: 5/31 BCx:  5/31 UCx:   Thank you for allowing pharmacy to be a part of this patient's care.  [name] 11/11/2015 8:53 AM   ADDENDUM Lactate 3.36, CBC 26.8. CrCl ~40 ml/min.  Plan: Vanc 500 mg q12h VT at steady state Zosyn 3.375 g IV q8h F/u CMet for renal function and additional abx doses F/u cx, s/sx clinical improvement

## 2015-11-11 NOTE — ED Notes (Signed)
Pt arrives EMS with c/o altered Mental status. Last known normal at 4pm yesterday by NH day staff. Pt normally oriented x4 today oriented x 1. No recent infection per NH staff.Saline 250 cc per EMS.

## 2015-11-11 NOTE — ED Notes (Signed)
Myself, Millie, RN and [name], RN changed patient's diaper; washed patient's bottom and placed 2 chuks underneath patient and readjusted her on stretcher

## 2015-11-11 NOTE — ED Provider Notes (Signed)
CSN: [id number]     Arrival date & time 11/11/15  [id number] History   First MD Initiated Contact with Patient 11/11/15 [phone]     Chief Complaint  Patient presents with  . Altered Mental Status     (Consider location/radiation/quality/duration/timing/severity/associated sxs/prior Treatment) Patient is a 55 y.o. female presenting with altered mental status.  Altered Mental Status  Level V caveat altered mental status. History is obtained from records coming patient and from [facility], LPN via telephone. Patient was noted to be confused at 7:15 AM today with blood pressure of 71/47 pulse 71 temperature 99.3 and respirations 16 pulse oximetry on room air was 95%. Patient had a few episodes of vomiting yesterday. She was treated with Phenergan with relief. Patient presently denies pain anywhere. EMS treated patient with peripheral IV prior to coming here. Patient is normally alert and fully oriented Past Medical History  Diagnosis Date  . Diabetes mellitus without complication ([place])   . Multiple sclerosis diagnosed 2002-not on Therapy any longer 06/26/2012  . Stage 4 decubitus ulcer ([place]) 07/05/2015  . Acute renal failure superimposed on stage 3 chronic kidney disease ([place]) 07/21/2015  . Malnutrition of moderate degree 07/21/2015  . UTI (lower urinary tract infection)    Past Surgical History  Procedure Laterality Date  . Hernia mesh removal    . Tubes tided    . Tubal ligation    . Esophagogastroduodenoscopy Left 01/02/2015    Procedure: ESOPHAGOGASTRODUODENOSCOPY (EGD);  Surgeon: Arta Silence, MD;  Location: [facility];  Service: Endoscopy;  Laterality: Left;   Family History  Problem Relation Age of Onset  . Diabetes Mother   . Diabetes Father   . Diabetes Sister   . Scoliosis Sister   . Alzheimer's disease Mother    Social History  Substance Use Topics  . Smoking status: Former Smoker -- 1.00 packs/day for 20 years    Types: Cigarettes    Quit date: 11/19/2014  . Smokeless tobacco:  Never Used  . Alcohol Use: 3.0 oz/week    5 Glasses of wine per week   OB History    No data available     Review of Systems  Unable to perform ROS: Mental status change  Genitourinary:       Chronic indwelling Foley  Musculoskeletal:       Bedbound  Skin:       Decubitus ulcers      Allergies  Review of patient's allergies indicates no known allergies.  Home Medications   Prior to Admission medications   Medication Sig Start Date End Date Taking? Authorizing Provider  Acetaminophen (APAP) 325 MG tablet Take 650 mg by mouth every 6 (six) hours as needed for pain.    Historical Provider, MD  albuterol (PROVENTIL HFA;VENTOLIN HFA) 108 (90 BASE) MCG/ACT inhaler Inhale 2 puffs into the lungs every 4 (four) hours as needed for wheezing or shortness of breath. 06/29/12   [name], MD  AMBULATORY NON FORMULARY MEDICATION Med Pass Offer 120 ml four times daily for weight loss.    Historical Provider, MD  Amino Acids-Protein Hydrolys (FEEDING SUPPLEMENT, PRO-STAT SUGAR FREE 64,) LIQD Take 30 mLs by mouth 2 (two) times daily. 07/09/15   Florencia Reasons, MD  baclofen (LIORESAL) 10 MG tablet Take 10 mg by mouth 3 (three) times daily.     Historical Provider, MD  famotidine (PEPCID) 20 MG tablet Take 1 tablet (20 mg total) by mouth 2 (two) times daily. 07/09/15   Florencia Reasons, MD  furosemide (LASIX)  40 MG tablet Take 1 tablet (40 mg total) by mouth daily as needed for fluid or edema. Reported on 07/05/2015 07/09/15   Florencia Reasons, MD  gabapentin (NEURONTIN) 100 MG capsule Take 100 mg by mouth at bedtime.    Historical Provider, MD  HYDROcodone-acetaminophen (NORCO) 5-325 MG tablet Take 1 tablet by mouth every 6 (six) hours as needed for moderate pain. 09/01/15   Reyne Dumas, MD  lovastatin (MEVACOR) 20 MG tablet Take 20 mg by mouth daily.    Historical Provider, MD  metFORMIN (GLUCOPHAGE) 500 MG tablet Take 500 mg by mouth 2 (two) times daily. 12/08/14   Historical Provider, MD  Multiple Vitamin (MULTIVITAMIN  WITH MINERALS) TABS tablet Take 1 tablet by mouth daily. 07/09/15   Florencia Reasons, MD  Omega 3 1000 MG CAPS Take 1,000 mg by mouth daily.    Historical Provider, MD  rivaroxaban (XARELTO) 20 MG TABS tablet Take 20 mg by mouth daily with supper. Reported on 08/12/2015    Historical Provider, MD  solifenacin (VESICARE) 5 MG tablet Take 5 mg by mouth daily.    Historical Provider, MD  vitamin C (ASCORBIC ACID) 500 MG tablet Take 500 mg by mouth daily.    Historical Provider, MD   There were no vitals taken for this visit. Physical Exam  Constitutional:  Ill-appearing  HENT:  Head: Normocephalic and atraumatic.  Mucous membranes dry. No facial asymmetry  Eyes: Conjunctivae are normal. Pupils are equal, round, and reactive to light.  Neck: Neck supple. No tracheal deviation present. No thyromegaly present.  Cardiovascular: Normal rate and regular rhythm.   No murmur heard. Pulmonary/Chest: Effort normal and breath sounds normal.  Abdominal: Soft. Bowel sounds are normal. She exhibits no distension. There is tenderness.  Tender at infraumbilical area  Musculoskeletal: Normal range of motion. She exhibits edema and tenderness.  Muscular atrophy of lower extremities  Lymphadenopathy:    She has no cervical adenopathy.  Neurological: She is alert. Coordination normal.  Oriented to name and hospital does not know month or year and follow simple commands moves both upper extremities, paraplegic  Skin: Skin is warm and dry. No rash noted.  Several superficial dime sized decubitus ulcers and bilateral buttocks  Psychiatric: She has a normal mood and affect.  Nursing note and vitals reviewed.   ED Course  Procedures (including critical care time) Labs Review Labs Reviewed - No data to display  Imaging Review No results found. I have personally reviewed and evaluated these images and lab results as part of my medical decision-making.   EKG Interpretation   Date/Time:  Wednesday Nov 11 2015  09:00:47 EDT Ventricular Rate:  107 PR Interval:  165 QRS Duration: 61 QT Interval:  370 QTC Calculation: 494 R Axis:   47 Text Interpretation:  Sinus tachycardia Nonspecific T abnormalities,  lateral leads Borderline prolonged QT interval Confirmed by Winfred Leeds   MD, Tinesha Siegrist [phone]) on 11/11/2015 9:07:26 AM     Foley catheter removed and new Foley placed.  11:58 AM patient resting comfortably in bed states "I'm all right" Results for orders placed or performed during the hospital encounter of 11/11/15  Blood Culture (routine x 2)  Result Value Ref Range   Specimen Description BLOOD RIGHT HAND    Special Requests BOTTLES DRAWN AEROBIC AND ANAEROBIC 5CC    Culture NO GROWTH <12 HOURS    Report Status PENDING   Blood Culture (routine x 2)  Result Value Ref Range   Specimen Description BLOOD RIGHT ANTECUBITAL  Special Requests      BOTTLES DRAWN AEROBIC AND ANAEROBIC 10CC AER 5CC ANA   Culture NO GROWTH <12 HOURS    Report Status PENDING   Comprehensive metabolic panel  Result Value Ref Range   Sodium 141 135 - 145 mmol/L   Potassium 4.2 3.5 - 5.1 mmol/L   Chloride 111 101 - 111 mmol/L   CO2 20 (L) 22 - 32 mmol/L   Glucose, Bld 166 (H) 65 - 99 mg/dL   BUN 53 (H) 6 - 20 mg/dL   Creatinine, Ser 1.39 (H) 0.44 - 1.00 mg/dL   Calcium 10.4 (H) 8.9 - 10.3 mg/dL   Total Protein 7.3 6.5 - 8.1 g/dL   Albumin 2.9 (L) 3.5 - 5.0 g/dL   AST 14 (L) 15 - 41 U/L   ALT 11 (L) 14 - 54 U/L   Alkaline Phosphatase 45 38 - 126 U/L   Total Bilirubin 0.5 0.3 - 1.2 mg/dL   GFR calc non Af Amer 41 (L) >60 mL/min   GFR calc Af Amer 48 (L) >60 mL/min   Anion gap 10 5 - 15  CBC WITH DIFFERENTIAL  Result Value Ref Range   WBC 26.8 (H) 4.0 - 10.5 K/uL   RBC 3.42 (L) 3.87 - 5.11 MIL/uL   Hemoglobin 9.4 (L) 12.0 - 15.0 g/dL   HCT 29.7 (L) 36.0 - 46.0 %   MCV 86.8 78.0 - 100.0 fL   MCH 27.5 26.0 - 34.0 pg   MCHC 31.6 30.0 - 36.0 g/dL   RDW 18.7 (H) 11.5 - 15.5 %   Platelets 478 (H) 150 - 400 K/uL    Neutrophils Relative % 91 %   Lymphocytes Relative 5 %   Monocytes Relative 4 %   Eosinophils Relative 0 %   Basophils Relative 0 %   Neutro Abs 24.4 (H) 1.7 - 7.7 K/uL   Lymphs Abs 1.3 0.7 - 4.0 K/uL   Monocytes Absolute 1.1 (H) 0.1 - 1.0 K/uL   Eosinophils Absolute 0.0 0.0 - 0.7 K/uL   Basophils Absolute 0.0 0.0 - 0.1 K/uL   WBC Morphology MILD LEFT SHIFT (1-5% METAS, OCC MYELO, OCC BANDS)   Urinalysis, Routine w reflex microscopic (not at [facility])  Result Value Ref Range   Color, Urine YELLOW YELLOW   APPearance TURBID (A) CLEAR   Specific Gravity, Urine 1.017 1.005 - 1.030   pH 7.0 5.0 - 8.0   Glucose, UA NEGATIVE NEGATIVE mg/dL   Hgb urine dipstick LARGE (A) NEGATIVE   Bilirubin Urine NEGATIVE NEGATIVE   Ketones, ur NEGATIVE NEGATIVE mg/dL   Protein, ur 30 (A) NEGATIVE mg/dL   Nitrite POSITIVE (A) NEGATIVE   Leukocytes, UA LARGE (A) NEGATIVE  Urine microscopic-add on  Result Value Ref Range   Squamous Epithelial / LPF TOO NUMEROUS TO COUNT (A) NONE SEEN   WBC, UA TOO NUMEROUS TO COUNT 0 - 5 WBC/hpf   RBC / HPF TOO NUMEROUS TO COUNT 0 - 5 RBC/hpf   Bacteria, UA MANY (A) NONE SEEN   Urine-Other AMORPHOUS URATES/PHOSPHATES   I-Stat CG4 Lactic Acid, ED  (not at  [facility])  Result Value Ref Range   Lactic Acid, Venous 3.36 (HH) 0.5 - 2.0 mmol/L   Comment NOTIFIED PHYSICIAN    No results found.  MDM  Code sepsis called based on surgical criteria of tachycardia, hypotension, fever. Source unknown. Patient requires Foley as she has decubitus ulcersOld records reviewed. Anemia chronic Final diagnoses:  None  Dr Marily Memos consulted and  will see patient in the emergency department. Patient will be admitted to stepdown unit. Diagnosis #1 sepsis #2 altered mental status #3 urinary tract infection #4 acute kidney injury # 5 hyperglycemia #6 decubitus ulcers CRITICAL CARE Performed by: Orlie Dakin Total critical care time: 40 minutes Critical care time was exclusive of separately  billable procedures and treating other patients. Critical care was necessary to treat or prevent imminent or life-threatening deterioration. Critical care was time spent personally by me on the following activities: development of treatment plan with patient and/or surrogate as well as nursing, discussions with consultants, evaluation of patient's response to treatment, examination of patient, obtaining history from patient or surrogate, ordering and performing treatments and interventions, ordering and review of laboratory studies, ordering and review of radiographic studies, pulse oximetry and re-evaluation of patient's condition.    Orlie Dakin, MD 11/11/15 1210

## 2015-11-11 NOTE — ED Notes (Signed)
Patient placed on monitor, continuous pulse oximetry and blood pressure cuff 

## 2015-11-11 NOTE — Consult Note (Addendum)
PULMONARY / CRITICAL CARE MEDICINE   Name: [name] MRN: [id number] DOB: 10-17-1960    ADMISSION DATE:  11/11/2015 CONSULTATION DATE:  5/31  REFERRING MD:  Charlotta Newton  CHIEF COMPLAINT:  AMS  HISTORY OF PRESENT ILLNESS:   55 year old female with past medical history as below, which is significant for multiple sclerosis, diabetes, sacral decubitus ulcer, chronic kidney disease, recurrent UTI with chronic indwelling Foley, and DVT on xarelto. She has several recent admissions typically for infection. Most frequently she has been hospitalized for urinary tract infection, but also for aspiration pneumonia. At baseline she is bedbound at [facility], but as recently as January she lived at home where she was wheelchair-bound. She has significant physical debilitations however at baseline cognition is intact. On the morning of 5/31 she was noted to be somewhat somnolent and confused and presented to the emergency department those complaints. She was initially hypotensive with blood pressure 71/47 and lactic acid elevated at about 5. The somewhat responded to IV fluid resuscitation. She was found to have urinary tract infection based on urinalysis and was admitted by the hospitalist team to the stepdown unit. Despite IV fluid resuscitation she remained hypotensive and PCCM has been asked to evaluate.  PAST MEDICAL HISTORY :  She  has a past medical history of Diabetes mellitus without complication ([place]); Multiple sclerosis diagnosed 2002-not on Therapy any longer (06/26/2012); Stage 4 decubitus ulcer ([place]) (07/05/2015); Acute renal failure superimposed on stage 3 chronic kidney disease ([place]) (07/21/2015); Malnutrition of moderate degree (07/21/2015); UTI (lower urinary tract infection); and DVT (deep venous thrombosis) ([place]).  PAST SURGICAL HISTORY: She  has past surgical history that includes Hernia mesh removal; tubes tided; Tubal ligation; and Esophagogastroduodenoscopy (Left, 01/02/2015).  No Known  Allergies  No current facility-administered medications on file prior to encounter.   Current Outpatient Prescriptions on File Prior to Encounter  Medication Sig  . Acetaminophen (APAP) 325 MG tablet Take 650 mg by mouth every 6 (six) hours as needed for pain.  Marland Kitchen albuterol (PROVENTIL HFA;VENTOLIN HFA) 108 (90 BASE) MCG/ACT inhaler Inhale 2 puffs into the lungs every 4 (four) hours as needed for wheezing or shortness of breath.  . AMBULATORY NON FORMULARY MEDICATION Med Pass Offer 120 ml four times daily for weight loss.  . Amino Acids-Protein Hydrolys (FEEDING SUPPLEMENT, PRO-STAT SUGAR FREE 64,) LIQD Take 30 mLs by mouth 2 (two) times daily.  . baclofen (LIORESAL) 10 MG tablet Take 10 mg by mouth 3 (three) times daily.   . famotidine (PEPCID) 20 MG tablet Take 1 tablet (20 mg total) by mouth 2 (two) times daily.  . furosemide (LASIX) 40 MG tablet Take 1 tablet (40 mg total) by mouth daily as needed for fluid or edema. Reported on 07/05/2015  . gabapentin (NEURONTIN) 100 MG capsule Take 100 mg by mouth at bedtime.  Marland Kitchen HYDROcodone-acetaminophen (NORCO) 5-325 MG tablet Take 1 tablet by mouth every 6 (six) hours as needed for moderate pain.  Marland Kitchen lovastatin (MEVACOR) 20 MG tablet Take 20 mg by mouth daily.  . metFORMIN (GLUCOPHAGE) 500 MG tablet Take 500 mg by mouth 2 (two) times daily.  . Multiple Vitamin (MULTIVITAMIN WITH MINERALS) TABS tablet Take 1 tablet by mouth daily.  . Omega 3 1000 MG CAPS Take 1,000 mg by mouth daily.  . rivaroxaban (XARELTO) 20 MG TABS tablet Take 20 mg by mouth daily with supper. Reported on 08/12/2015  . solifenacin (VESICARE) 5 MG tablet Take 5 mg by mouth daily.  . vitamin C (ASCORBIC ACID)  500 MG tablet Take 500 mg by mouth daily.    FAMILY HISTORY:  Her indicated that her mother is alive. She indicated that her father is alive.   SOCIAL HISTORY: She  reports that she quit smoking about a year ago. Her smoking use included Cigarettes. She has a 20 pack-year  smoking history. She has never used smokeless tobacco. She reports that she drinks about 3.0 oz of alcohol per week. She reports that she does not use illicit drugs.  REVIEW OF SYSTEMS:   Bolds are positive  Constitutional: weight loss, gain, night sweats, Fevers, chills, fatigue .  HEENT: headaches, Sore throat, sneezing, nasal congestion, post nasal drip, Difficulty swallowing, Tooth/dental problems, visual complaints visual changes, ear ache CV:  chest pain, radiates: ,Orthopnea, PND, swelling in lower extremities, dizziness, palpitations, syncope.  GI  heartburn, indigestion, abdominal pain, nausea, vomiting, diarrhea, change in bowel habits, loss of appetite, bloody stools.  Resp: cough, productive: , hemoptysis, dyspnea, chest pain, pleuritic.  Skin: rash or itching or icterus GU: dysuria, change in color of urine, urgency or frequency. flank pain, hematuria  MS: joint pain or swelling. decreased range of motion  Psych: change in mood or affect. depression or anxiety.  Neuro: difficulty with speech, weakness, numbness, ataxia   SUBJECTIVE:    VITAL SIGNS: BP 94/58 mmHg  Pulse 99  Temp(Src) 98.2 F (36.8 C) (Oral)  Resp 16  Wt 59.875 kg (132 lb)  SpO2 100%  HEMODYNAMICS:    VENTILATOR SETTINGS:    INTAKE / OUTPUT:    PHYSICAL EXAMINATION: General:  Female of normal body habitus in NAD Neuro:  Alert, oriented to self and place HEENT:  [place]/AT, PERRL, no JVD Cardiovascular:  RRR, 3/6 SEM Lungs:  clear Abdomen:  Soft, diffusely tender Musculoskeletal:  No acute deformity, foot drop and extremity position c/w MS Skin:  Several sacral wounds  LABS:  BMET  Recent Labs Lab 11/11/15 0929 11/11/15 1447  NA 141  --   K 4.2  --   CL 111  --   CO2 20*  --   BUN 53*  --   CREATININE 1.39* 1.27*  GLUCOSE 166*  --     Electrolytes  Recent Labs Lab 11/11/15 0929 11/11/15 1447  CALCIUM 10.4*  --   MG  --  1.7  PHOS  --  4.3    CBC  Recent Labs Lab  11/11/15 0929  WBC 26.8*  HGB 9.4*  HCT 29.7*  PLT 478*    Coag's  Recent Labs Lab 11/11/15 1447  APTT 41*  INR 1.86*    Sepsis Markers  Recent Labs Lab 11/11/15 1444 11/11/15 1446 11/11/15 1447 11/11/15 1627  LATICACIDVEN 5.42* 4.8*  --  5.5*  PROCALCITON  --   --  61.49  --     ABG No results for input(s): PHART, PCO2ART, PO2ART in the last 168 hours.  Liver Enzymes  Recent Labs Lab 11/11/15 0929  AST 14*  ALT 11*  ALKPHOS 45  BILITOT 0.5  ALBUMIN 2.9*    Cardiac Enzymes  Recent Labs Lab 11/11/15 1627  TROPONINI <0.03    Glucose  Recent Labs Lab 11/11/15 1701  GLUCAP 131*    Imaging Dg Abd 1 View  11/11/2015  CLINICAL DATA:  Abdominal pain.  Sepsis. EXAM: ABDOMEN - 1 VIEW COMPARISON:  Renal ultrasound 07/21/2015. CT abdomen and pelvis 12/15/2014. FINDINGS: Two adjacent oblong calcifications in the left upper abdomen measure 2-2.5 cm each and are consistent with known left renal calculi.  There is a moderate amount of stool in the colon. Gas is present in nondilated loops of small bowel without evidence of obstruction. No gross intraperitoneal free air on this supine examination. Likely phleboliths in the pelvis. No acute osseous abnormality identified. IMPRESSION: 1. Nonobstructed bowel gas pattern. 2. Left renal calculi. Electronically Signed   By: Logan Bores M.D.   On: 11/11/2015 13:11   Dg Chest Port 1 View  11/11/2015  CLINICAL DATA:  Sepsis EXAM: PORTABLE CHEST 1 VIEW COMPARISON:  08/31/2015 FINDINGS: Low lung volumes are present, causing crowding of the pulmonary vasculature. Borderline enlargement of the cardiopericardial silhouette accounting for the low lung volumes and AP projection. Stable mild blunting of the left lateral costophrenic angle. Stable mild left retro diaphragmatic density compared to the right. IMPRESSION: 1. Retro diaphragmatic density on the left with mild blunting of the left lateral costophrenic angle, potentially from  left pleural effusion and/or left lower lobe atelectasis/pneumonia. This appears stable. Electronically Signed   By: Van Clines M.D.   On: 11/11/2015 13:12     STUDIES:  KUB 5/31 > left renal calculi  CULTURES: BCx2 5/31 >> UC 5/13 >>  ANTIBIOTICS: Zosyn 5/31 >  Vancomycin 5/31>  SIGNIFICANT EVENTS: 5/31 admit  LINES/TUBES:   ASSESSMENT/PLAN   Severe sepsis secondary to urinary tract infection - 55 year old female with severe MS and chronic foley. Bedbound at SNF. Admitted with urosepsis and remained hypotensive. Upon my evaluation her BP is 94/58 with MAP 70, however, most recent LA mildly elevated from prior. She has received 3L and still appears dry to me. BUN markedly elevated on admit labs to suggest dehydration. Some concern for intra-abdominal process with abdominal pain or wound infection.   - Continue monitoring in SDU for now - Will bolus additional 1L  - If BP drops again after additional liter will need ICU transfer for CVL and pressors vs peripheral neo - Trend lactic acid to ensure clearing - Assess cortisol and consider stress dose hydrocortisone  - Trend PCT - Continue ABX per primary - Follow cultures and narrow ABX as able - Consider CT abd pelvis if not improving.  DM -hold metformin -SSI per primary  MS -per primary  DVT on eliquis -per primary  Georgann Housekeeper, AGACNP-BC [facility] Pager [phone] or [phone]  11/11/2015 6:14 PM   55 yo female presented from NH with altered mental status.  She had low grade temperature and hypotension.  She was found to have elevated lactic acid.  She was started on antibiotics and IV fluids.  Her mentation and blood pressure have improved.  She c/o abdominal discomfort.  She denies chest pain, headache, or dyspnea.  She had nausea with vomiting prior to admission, but this has improved.  Alert, follows commands.  No stridor.  HR regular, tachycardic.  No wheeze.  Mild  lower abdominal tenderness >> no guarding/rebound.  Several decubitus ulcers >> present prior to this admission.  Creatinine 1.27, Lactic acid 5.5, Procalcitonin 61.49, WBC 26.8.  Assessment/plan:  Severe sepsis >> likely from UTI, but could also has from decubitus ulcers or intra-abdominal process given presence of abdominal pain. - continue vancomycin/zosyn - continue IV fluids - defer CVL placement for now - f/u lactic acid level >> if her abdominal pain persists and lactic acid remains elevated, then would consider doing CT abd/pelvis to further assess - of note is that she takes metformin which could also contribute to persistent elevation of lactic acid level  Trae Bovenzi,  MD [place] 11/12/2015, 3:50 AM Pager:  [phone] After 3pm call: [phone]

## 2015-11-11 NOTE — ED Notes (Signed)
Assisted [name], RN with replacing foley cath; urine sample collected from newly placed foley cath and sent to lab for testing by [name], RN; readjusted patient on stretcher, no needs at this time

## 2015-11-11 NOTE — Progress Notes (Addendum)
Informed attending physician that patient refuses another blood draw attempt.  Patient needs repeat lactic acid and cortisol levels.  Labs to be deferred and drawn at the same time in the morning per MD.

## 2015-11-11 NOTE — ED Notes (Signed)
Attempted to call report x 1  

## 2015-11-11 NOTE — ED Notes (Signed)
Code Sepsis called on pt @ 0840. Spoke to [place].

## 2015-11-11 NOTE — Progress Notes (Signed)
Pharmacy Code Sepsis Protocol  Time of code sepsis page: [phone] [x]  Antibiotics administered at 0910 []  Antibiotics administered prior to code at  (if checked, omit next 2 questions)  Were antibiotics ordered at the time of the code sepsis page? Yes Was it required to contact the physician? [x]  Physician not contacted []  Physician contacted to order antibiotics for code sepsis []  Physician contacted to recommend changing antibiotics  Pharmacy consulted for: vancomycin/Zosyn  Anti-infectives    Start     Dose/Rate Route Frequency Ordered Stop   11/11/15 0915  vancomycin (VANCOCIN) 1,250 mg in sodium chloride 0.9 % 250 mL IVPB     1,250 mg 166.7 mL/hr over 90 Minutes Intravenous  Once 11/11/15 0851     11/11/15 0900  piperacillin-tazobactam (ZOSYN) IVPB 3.375 g     3.375 g 100 mL/hr over 30 Minutes Intravenous  Once 11/11/15 0849     11/11/15 0900  vancomycin (VANCOCIN) IVPB 1000 mg/200 mL premix  Status:  Discontinued     1,000 mg 200 mL/hr over 60 Minutes Intravenous  Once 11/11/15 0849 11/11/15 0851        Nurse education provided: []  Minutes left to administer antibiotics to achieve 1 hour goal []  Correct order of antibiotic administration []  Antibiotic Y-site compatibilities     [name], PharmD 11/11/2015, 8:53 AM

## 2015-11-11 NOTE — H&P (Signed)
History and Physical    [name] [id number] DOB: [birth year] DOA: 11/11/2015  PCP: Gildardo Cranker, DO Patient coming from: Nursing Home  Chief Complaint: AMS  HPI: [name] is a 55 y.o. female with medical history significant of MS, chronic decubitus ulcers, C KD stage III, protein calorie malnutrition, bedbound, chronic UTIs with indwelling Foley catheter presenting from the nursing home after patient was noted to be somnolent and confused.   Level V caveat applies as patient is unable to provide reliable history. Patient is alert and oriented to person and place only. Answers are somewhat coherent but at other times cannot is fairly gibberish. History provided by EDP, nursing home report, and family members via phone conversation. At 7:15 this morning patient was noted by nursing home staff to be confused and to have a blood pressure of 71/47 with pulse of 71, temperature 99.3 and respirations 16 with 95% O2 sats. Per report patient had vomited a couple of times a day before but was otherwise in her normal state of health. Patient was treated with Phenergan after the emesis with resolution. Currently patient has no complaints. Prior to this event patient was in her normal state of health. EMS was contacted, peripheral IV and fluids were started, and patient was transported to [facility] ED. Per report patient is normally completely alert and oriented.   ED Course: Objective findings outlined below, sepsis protocol initiated based on early lab result findings and vital signs.  Review of Systems: As per HPI otherwise 10 point review of systems negative.   Ambulatory Status: bed bound  Past Medical History  Diagnosis Date  . Diabetes mellitus without complication ([place])   . Multiple sclerosis diagnosed 2002-not on Therapy any longer 06/26/2012  . Stage 4 decubitus ulcer ([place]) 07/05/2015  . Acute renal failure superimposed on stage 3 chronic kidney disease ([place]) 07/21/2015  . Malnutrition of  moderate degree 07/21/2015  . UTI (lower urinary tract infection)   . DVT (deep venous thrombosis) [facility])     Past Surgical History  Procedure Laterality Date  . Hernia mesh removal    . Tubes tided    . Tubal ligation    . Esophagogastroduodenoscopy Left 01/02/2015    Procedure: ESOPHAGOGASTRODUODENOSCOPY (EGD);  Surgeon: Arta Silence, MD;  Location: [facility];  Service: Endoscopy;  Laterality: Left;     reports that she quit smoking about a year ago. Her smoking use included Cigarettes. She has a 20 pack-year smoking history. She has never used smokeless tobacco. She reports that she drinks about 3.0 oz of alcohol per week. She reports that she does not use illicit drugs.  No Known Allergies  Family History  Problem Relation Age of Onset  . Diabetes Mother   . Diabetes Father   . Diabetes Sister   . Scoliosis Sister   . Alzheimer's disease Mother     Prior to Admission medications   Medication Sig Start Date End Date Taking? Authorizing Provider  Acetaminophen (APAP) 325 MG tablet Take 650 mg by mouth every 6 (six) hours as needed for pain.   Yes Historical Provider, MD  albuterol (PROVENTIL HFA;VENTOLIN HFA) 108 (90 BASE) MCG/ACT inhaler Inhale 2 puffs into the lungs every 4 (four) hours as needed for wheezing or shortness of breath. 06/29/12  Yes [name], MD  AMBULATORY NON FORMULARY MEDICATION Med Pass Offer 120 ml four times daily for weight loss.   Yes Historical Provider, MD  Amino Acids-Protein Hydrolys (FEEDING SUPPLEMENT, PRO-STAT SUGAR FREE 64,) LIQD  Take 30 mLs by mouth 2 (two) times daily. 07/09/15  Yes Florencia Reasons, MD  baclofen (LIORESAL) 10 MG tablet Take 10 mg by mouth 3 (three) times daily.    Yes Historical Provider, MD  famotidine (PEPCID) 20 MG tablet Take 1 tablet (20 mg total) by mouth 2 (two) times daily. 07/09/15  Yes Florencia Reasons, MD  furosemide (LASIX) 40 MG tablet Take 1 tablet (40 mg total) by mouth daily as needed for fluid or edema. Reported on 07/05/2015  07/09/15  Yes Florencia Reasons, MD  gabapentin (NEURONTIN) 100 MG capsule Take 100 mg by mouth at bedtime.   Yes Historical Provider, MD  HYDROcodone-acetaminophen (NORCO) 5-325 MG tablet Take 1 tablet by mouth every 6 (six) hours as needed for moderate pain. 09/01/15  Yes Reyne Dumas, MD  lovastatin (MEVACOR) 20 MG tablet Take 20 mg by mouth daily.   Yes Historical Provider, MD  metFORMIN (GLUCOPHAGE) 500 MG tablet Take 500 mg by mouth 2 (two) times daily. 12/08/14  Yes Historical Provider, MD  Multiple Vitamin (MULTIVITAMIN WITH MINERALS) TABS tablet Take 1 tablet by mouth daily. 07/09/15  Yes Florencia Reasons, MD  Omega 3 1000 MG CAPS Take 1,000 mg by mouth daily.   Yes Historical Provider, MD  promethazine (PHENERGAN) 25 MG tablet Take 25 mg by mouth every 6 (six) hours as needed for nausea or vomiting.   Yes Historical Provider, MD  rivaroxaban (XARELTO) 20 MG TABS tablet Take 20 mg by mouth daily with supper. Reported on 08/12/2015   Yes Historical Provider, MD  solifenacin (VESICARE) 5 MG tablet Take 5 mg by mouth daily.   Yes Historical Provider, MD  vitamin C (ASCORBIC ACID) 500 MG tablet Take 500 mg by mouth daily.   Yes Historical Provider, MD    Physical Exam: Filed Vitals:   11/11/15 1100 11/11/15 1113 11/11/15 1130 11/11/15 1200  BP: 95/54 95/54 100/61 92/54  Pulse: 99 98 103 99  Temp:  97.1 F (36.2 C)    TempSrc:  Axillary    Resp: 18  25 17   Weight:      SpO2: 99% 100% 98% 100%      Constitutional: Resting in bed, calm  Eyes:  PERRL, lids and conjunctivae normal ENMT: Dry mucous membranes, poor dentition with numerous missing teeth Neck:  normal, supple, no masses, no thyromegaly Respiratory:  clear to auscultation bilaterally, no wheezing, no crackles. Normal respiratory effort. No accessory muscle use.  Cardiovascular: Difficult to appreciate cardiac sounds, 2/6 systolic murmur, tachycardic, Abdomen: Nondistended, soft, patient cries out in pain with palpation of the middle to lower  abdominal region, normal active bowel sounds Musculoskeletal: Patient is bedbound, feet contracture down in plantar flexion position, no appreciable effusions or bony abnormalities Skin: Thickened, keratotic skin of the dorsum of the feet bilaterally without evidence of significant rash. Due to patient's complaints of pain and immobility she has not moved again to evaluate sacral ulcerations. These are noted in the EDP note. Neurologic: Alert and oriented 2, CN 2-12 grossly intact. Sensation intact,  Psychiatric: Answers questions appropriately but only intermittently, otherwise speaks in a somewhat gibberish manner. Patient without complaints at this time.  Labs on Admission: I have personally reviewed following labs and imaging studies  CBC:  Recent Labs Lab 11/11/15 0929  WBC 26.8*  NEUTROABS 24.4*  HGB 9.4*  HCT 29.7*  MCV 86.8  PLT 123456*   Basic Metabolic Panel:  Recent Labs Lab 11/11/15 0929  NA 141  K 4.2  CL 111  CO2 20*  GLUCOSE 166*  BUN 53*  CREATININE 1.39*  CALCIUM 10.4*   GFR: Estimated Creatinine Clearance: 41.8 mL/min (by C-G formula based on Cr of 1.39). Liver Function Tests:  Recent Labs Lab 11/11/15 0929  AST 14*  ALT 11*  ALKPHOS 45  BILITOT 0.5  PROT 7.3  ALBUMIN 2.9*   No results for input(s): LIPASE, AMYLASE in the last 168 hours. No results for input(s): AMMONIA in the last 168 hours. Coagulation Profile: No results for input(s): INR, PROTIME in the last 168 hours. Cardiac Enzymes: No results for input(s): CKTOTAL, CKMB, CKMBINDEX, TROPONINI in the last 168 hours. BNP (last 3 results) No results for input(s): PROBNP in the last 8760 hours. HbA1C: No results for input(s): HGBA1C in the last 72 hours. CBG: No results for input(s): GLUCAP in the last 168 hours. Lipid Profile: No results for input(s): CHOL, HDL, LDLCALC, TRIG, CHOLHDL, LDLDIRECT in the last 72 hours. Thyroid Function Tests: No results for input(s): TSH, T4TOTAL,  FREET4, T3FREE, THYROIDAB in the last 72 hours. Anemia Panel: No results for input(s): VITAMINB12, FOLATE, FERRITIN, TIBC, IRON, RETICCTPCT in the last 72 hours. Urine analysis:    Component Value Date/Time   COLORURINE YELLOW 11/11/2015 1011   APPEARANCEUR TURBID* 11/11/2015 1011   LABSPEC 1.017 11/11/2015 1011   PHURINE 7.0 11/11/2015 1011   GLUCOSEU NEGATIVE 11/11/2015 1011   HGBUR LARGE* 11/11/2015 1011   BILIRUBINUR NEGATIVE 11/11/2015 1011   KETONESUR NEGATIVE 11/11/2015 1011   PROTEINUR 30* 11/11/2015 1011   UROBILINOGEN 0.2 12/20/2014 1851   NITRITE POSITIVE* 11/11/2015 1011   LEUKOCYTESUR LARGE* 11/11/2015 1011    Creatinine Clearance: Estimated Creatinine Clearance: 41.8 mL/min (by C-G formula based on Cr of 1.39).  Sepsis Labs: @LABRCNTIP (procalcitonin:4,lacticidven:4) ) Recent Results (from the past 240 hour(s))  Blood Culture (routine x 2)     Status: None (Preliminary result)   Collection Time: 11/11/15  9:00 AM  Result Value Ref Range Status   Specimen Description BLOOD RIGHT HAND  Final   Special Requests BOTTLES DRAWN AEROBIC AND ANAEROBIC 5CC  Final   Culture NO GROWTH <12 HOURS  Final   Report Status PENDING  Incomplete  Blood Culture (routine x 2)     Status: None (Preliminary result)   Collection Time: 11/11/15  9:10 AM  Result Value Ref Range Status   Specimen Description BLOOD RIGHT ANTECUBITAL  Final   Special Requests   Final    BOTTLES DRAWN AEROBIC AND ANAEROBIC 10CC AER 5CC ANA   Culture NO GROWTH <12 HOURS  Final   Report Status PENDING  Incomplete     Radiological Exams on Admission: No results found.  EKG: Independently reviewed.   Assessment/Plan Active Problems:   Multiple sclerosis diagnosed 2002-not on Therapy any longer   Acute encephalopathy   Diabetes mellitus type 2, controlled ([place])   UTI (lower urinary tract infection)   Sepsis ([facility])   Lower extremity edema   Chronic indwelling Foley catheter   AKI (acute kidney  injury) ([place])   Neurogenic bladder   Protein calorie malnutrition ([facility])   Physical deconditioning   HLD (hyperlipidemia)   Sepsis: Likely urologic source. UA grossly abnormal but w/ chronic indwelling foley. UA sample taken after foley change. Initial blood pressure 71/47, tachycardic, febrile, tachypneic, the WBC 26.9, with left shift, lactic acid 3.36, and patient acutely encephalopathic. Subsequent protocol initiated by ED - Stepdown - Continue vancomycin and Zosyn - CXR, KUB (also complaining of Abd pain)  AKI: Cr 1.39. Baseline 0.9. Likely  from above.  - IVF - BMET in am  Neurogenic bladder: Patient with chronic indwelling Foley catheter. This is been the source of the patient's last 2 admissions. Foley changed at time of admission. - Consider urology consult - Consider discharging patient on chronic prophylactic UTI antibiotics - Continue Vesicare and Foley catheter  Protein calorie malnutrition/physical deconditioning: likely secondary to chronic MS. No MS medications. Chronic issue for patient. Bedbound. Per EDP note "several superficial dime sized decubitus ulcers on bilat buttocks."  - PT/OT - Air overlay - wound care - Pro stat feeding supplement - Continue baclofen for contractures  DM: On metformin only - Hold metformin - SSI  H/o DVT: - continue xarelto  Dependent edema: Patient on Lasix without diagnoses of CHF. No indication for this in patient's history of lower extremity edema - Echo as above - Hold Lasix due to hypotension  HLD: - Continue statin   DVT prophylaxis: Xarelto  Code Status: FULL  Family Communication: cousin - Denise Disposition Plan: pending improvement  Consults called: none  Admission status: step down    Tanish Sinkler J MD [facility]  If 7PM-7AM, please contact night-coverage [email or web address] Password [email or web address]  11/11/2015, 12:45 PM

## 2015-11-12 ENCOUNTER — Inpatient Hospital Stay (HOSPITAL_COMMUNITY): Payer: Medicare Other

## 2015-11-12 DIAGNOSIS — E118 Type 2 diabetes mellitus with unspecified complications: Secondary | ICD-10-CM

## 2015-11-12 DIAGNOSIS — E46 Unspecified protein-calorie malnutrition: Secondary | ICD-10-CM | POA: Diagnosis present

## 2015-11-12 DIAGNOSIS — L8990 Pressure ulcer of unspecified site, unspecified stage: Secondary | ICD-10-CM | POA: Diagnosis present

## 2015-11-12 DIAGNOSIS — A419 Sepsis, unspecified organism: Secondary | ICD-10-CM | POA: Diagnosis present

## 2015-11-12 DIAGNOSIS — E785 Hyperlipidemia, unspecified: Secondary | ICD-10-CM

## 2015-11-12 LAB — BLOOD CULTURE ID PANEL (REFLEXED)
Acinetobacter baumannii: NOT DETECTED
CANDIDA ALBICANS: NOT DETECTED
CANDIDA GLABRATA: NOT DETECTED
CANDIDA KRUSEI: NOT DETECTED
CANDIDA TROPICALIS: NOT DETECTED
Candida parapsilosis: NOT DETECTED
Carbapenem resistance: NOT DETECTED
ENTEROBACTER CLOACAE COMPLEX: NOT DETECTED
ESCHERICHIA COLI: NOT DETECTED
Enterobacteriaceae species: NOT DETECTED
Enterococcus species: NOT DETECTED
HAEMOPHILUS INFLUENZAE: NOT DETECTED
KLEBSIELLA OXYTOCA: NOT DETECTED
Klebsiella pneumoniae: NOT DETECTED
LISTERIA MONOCYTOGENES: NOT DETECTED
METHICILLIN RESISTANCE: DETECTED — AB
Neisseria meningitidis: NOT DETECTED
PROTEUS SPECIES: NOT DETECTED
Pseudomonas aeruginosa: NOT DETECTED
SERRATIA MARCESCENS: NOT DETECTED
STREPTOCOCCUS PNEUMONIAE: NOT DETECTED
STREPTOCOCCUS SPECIES: NOT DETECTED
Staphylococcus aureus (BCID): NOT DETECTED
Staphylococcus species: DETECTED — AB
Streptococcus agalactiae: NOT DETECTED
Streptococcus pyogenes: NOT DETECTED
Vancomycin resistance: NOT DETECTED

## 2015-11-12 LAB — COMPREHENSIVE METABOLIC PANEL
ALT: 13 U/L — ABNORMAL LOW (ref 14–54)
ANION GAP: 8 (ref 5–15)
AST: 17 U/L (ref 15–41)
Albumin: 2.3 g/dL — ABNORMAL LOW (ref 3.5–5.0)
Alkaline Phosphatase: 54 U/L (ref 38–126)
BILIRUBIN TOTAL: 0.5 mg/dL (ref 0.3–1.2)
BUN: 32 mg/dL — ABNORMAL HIGH (ref 6–20)
CO2: 20 mmol/L — ABNORMAL LOW (ref 22–32)
Calcium: 9.3 mg/dL (ref 8.9–10.3)
Chloride: 115 mmol/L — ABNORMAL HIGH (ref 101–111)
Creatinine, Ser: 0.82 mg/dL (ref 0.44–1.00)
GFR calc Af Amer: >60 mL/min (ref >60)
Glucose, Bld: 117 mg/dL — ABNORMAL HIGH (ref 65–99)
POTASSIUM: 3 mmol/L — AB (ref 3.5–5.1)
Sodium: 143 mmol/L (ref 135–145)
TOTAL PROTEIN: 6.3 g/dL — AB (ref 6.5–8.1)

## 2015-11-12 LAB — HEMOGLOBIN A1C
Hgb A1c MFr Bld: 5.9 % — ABNORMAL HIGH (ref 4.8–5.6)
MEAN PLASMA GLUCOSE: 123 mg/dL

## 2015-11-12 LAB — CBC
HEMATOCRIT: 25.5 % — AB (ref 36.0–46.0)
Hemoglobin: 7.9 g/dL — ABNORMAL LOW (ref 12.0–15.0)
MCH: 27.2 pg (ref 26.0–34.0)
MCHC: 31 g/dL (ref 30.0–36.0)
MCV: 87.9 fL (ref 78.0–100.0)
Platelets: 382 10*3/uL (ref 150–400)
RBC: 2.9 MIL/uL — ABNORMAL LOW (ref 3.87–5.11)
RDW: 18.6 % — AB (ref 11.5–15.5)
WBC: 16.3 10*3/uL — ABNORMAL HIGH (ref 4.0–10.5)

## 2015-11-12 LAB — URINE CULTURE

## 2015-11-12 LAB — GLUCOSE, CAPILLARY
GLUCOSE-CAPILLARY: 117 mg/dL — AB (ref 65–99)
GLUCOSE-CAPILLARY: 131 mg/dL — AB (ref 65–99)
GLUCOSE-CAPILLARY: 135 mg/dL — AB (ref 65–99)
Glucose-Capillary: 132 mg/dL — ABNORMAL HIGH (ref 65–99)

## 2015-11-12 LAB — LACTIC ACID, PLASMA
LACTIC ACID, VENOUS: 1.3 mmol/L (ref 0.5–2.0)
Lactic Acid, Venous: 2.5 mmol/L (ref 0.5–2.0)

## 2015-11-12 LAB — CORTISOL: Cortisol, Plasma: 20.4 ug/dL

## 2015-11-12 MED ORDER — ENSURE ENLIVE PO LIQD
237.0000 mL | Freq: Two times a day (BID) | ORAL | Status: DC
  Administered 2015-11-12 – 2015-11-18 (×12): 237 mL via ORAL

## 2015-11-12 MED ORDER — POLYETHYLENE GLYCOL 3350 17 G PO PACK
17.0000 g | PACK | Freq: Two times a day (BID) | ORAL | Status: DC
  Administered 2015-11-12 – 2015-11-18 (×10): 17 g via ORAL
  Filled 2015-11-12 (×12): qty 1

## 2015-11-12 MED ORDER — VANCOMYCIN HCL IN DEXTROSE 750-5 MG/150ML-% IV SOLN
750.0000 mg | Freq: Two times a day (BID) | INTRAVENOUS | Status: DC
  Administered 2015-11-12 – 2015-11-15 (×6): 750 mg via INTRAVENOUS
  Filled 2015-11-12 (×7): qty 150

## 2015-11-12 MED ORDER — POTASSIUM CHLORIDE CRYS ER 20 MEQ PO TBCR
40.0000 meq | EXTENDED_RELEASE_TABLET | Freq: Two times a day (BID) | ORAL | Status: DC
  Administered 2015-11-12 (×2): 40 meq via ORAL
  Filled 2015-11-12 (×2): qty 2

## 2015-11-12 NOTE — Evaluation (Signed)
Occupational Therapy Evaluation Patient Details Name: [name] MRN: [id number] DOB: December 18, 1960 Today's Date: 11/12/2015    History of Present Illness This 55 y.o. female admitted with AMS, hypotension.  She was found to have UTI/sepsis.  PMH includes MS, DM, h/ sacral decubitus ulcer, CKD, h/o DVT (on Xarelto.   Clinical Impression   Pt admitted with above.  She appears to be at her baseline level of functioning and does not need acute OT.  Feel she would benefit from trial of OT at SNF to maximize independence.  OT will sign off.     Follow Up Recommendations  SNF    Equipment Recommendations  None recommended by OT    Recommendations for Other Services       Precautions / Restrictions Precautions Precautions: Fall      Mobility Bed Mobility Overal bed mobility: Needs Assistance Bed Mobility: Rolling Rolling: Total assist            Transfers Overall transfer level: Needs assistance               General transfer comment: Pt has been a dependent transfer since at least 2/17.  The staff at [facility] hoyer lift her to w/c     Balance                                            ADL Overall ADL's : Needs assistance/impaired Eating/Feeding: Set up;Bed level   Grooming: Maximal assistance;Bed level   Upper Body Bathing: Total assistance;Bed level   Lower Body Bathing: Total assistance;Bed level   Upper Body Dressing : Total assistance;Bed level   Lower Body Dressing: Total assistance;Bed level   Toilet Transfer: Total assistance   Toileting- Clothing Manipulation and Hygiene: Total assistance;Bed level       Functional mobility during ADLs: Total assistance;+2 for physical assistance;+2 for safety/equipment General ADL Comments: Pt refused all attempts to move to EOB      Vision Additional Comments: Pt reports vision is at baseline    Perception     Praxis      Pertinent Vitals/Pain Pain Assessment: Faces Faces Pain  Scale: Hurts even more Pain Location: Rt hand and LEs  Pain Descriptors / Indicators: Aching;Grimacing;Guarding Pain Intervention(s): Monitored during session;Limited activity within patient's tolerance     Hand Dominance Right (but unable to use Rt UE functionally so has switched to usin)   Extremity/Trunk Assessment Upper Extremity Assessment Upper Extremity Assessment: RUE deficits/detail;LUE deficits/detail RUE Deficits / Details: Pt will not allow therapist to move Rt UE and will not attempt ROM due to pain from IV.   fingers maintained in flexed position  RUE Coordination: decreased fine motor;decreased gross motor LUE Deficits / Details: Pt is able to flex shoulder to ~80*.  elbow grossly 3+/5 hand grossly 3+/5.  Pt reports she was able to feed self with Rt UE  LUE Coordination: decreased fine motor;decreased gross motor   Lower Extremity Assessment Lower Extremity Assessment: Defer to PT evaluation       Communication Communication Communication: No difficulties   Cognition Arousal/Alertness: Awake/alert Behavior During Therapy: Flat affect (irritable ) Overall Cognitive Status: Impaired/Different from baseline (Pt with impaired orientation )                     General Comments       Exercises  Shoulder Instructions      Home Living Family/patient expects to be discharged to:: Skilled nursing facility                                        Prior Functioning/Environment Level of Independence: Needs assistance  Gait / Transfers Assistance Needed: Lift used at [facility] to assist pt to [facility] ADL's / Homemaking Assistance Needed: Has been feeding herself using her Lt hand for the past year (was Rt handed).  Pt is dependent with all other aspects of ADLs        OT Diagnosis: Generalized weakness;Cognitive deficits;Acute pain;Paresis   OT Problem List: Decreased strength;Decreased range of motion;Decreased activity tolerance;Impaired  balance (sitting and/or standing);Decreased coordination;Decreased cognition;Decreased safety awareness;Decreased knowledge of use of DME or AE;Impaired UE functional use;Pain;Impaired sensation   OT Treatment/Interventions:      OT Goals(Current goals can be found in the care plan section) Acute Rehab OT Goals Patient Stated Goal: to get back home and use power w/c  OT Goal Formulation: All assessment and education complete, DC therapy  OT Frequency:     Barriers to D/C:            Co-evaluation              End of Session    Activity Tolerance: Patient limited by pain Patient left: in bed;with call bell/phone within reach;with bed alarm set   Time: 1432-1449 OT Time Calculation (min): 17 min Charges:  OT General Charges $OT Visit: 1 Procedure OT Evaluation $OT Eval Moderate Complexity: 1 Procedure G-Codes:    Torrell Krutz M 2015/12/09, 3:11 PM

## 2015-11-12 NOTE — Clinical Social Work Note (Signed)
Clinical Social Work Assessment  Patient Details  Name: [name] MRN: [id number] Date of Birth: [birth year]  Date of referral:  11/12/15               Reason for consult:  Facility Placement                Permission sought to share information with:  Family Supports Permission granted to share information::  No  Name::     Interior and spatial designer::  [facility]  Relationship::  cousin  Sport and exercise psychologist Information:     Housing/Transportation Living arrangements for the past 2 months:  [place] of Information:  Adult Children Patient Interpreter Needed:  None Criminal Activity/Legal Involvement Pertinent to Current Situation/Hospitalization:  No - Comment as needed Significant Relationships:  Other Family Members Lives with:  Facility Resident Do you feel safe going back to the place where you live?  Yes Need for family participation in patient care:  Yes (Comment) (decision making)  Care giving concerns:  Pt lives at [facility]- no concerns expressed by pt cousin Facilities manager / plan:  CSW spoke with pt cousin to discuss plan for pt at time of DC.  [name] states pt has been at [place] for almost a year and plan will be for pt to return there when stable.  Employment status:  Retired Nurse, adult PT Recommendations:  [place] / Referral to community resources:  Agenda  Patient/Family's Response to care:  Agreeable to plan for pt to return to [place].  Patient/Family's Understanding of and Emotional Response to Diagnosis, Current Treatment, and Prognosis:  No questions at this time- just hopeful pt will get well quickly.  Emotional Assessment Appearance:  Appears stated age Attitude/Demeanor/Rapport:  Unable to Assess Affect (typically observed):  Unable to Assess Orientation:  Oriented to  Time, Oriented to Place, Oriented to Self Alcohol / Substance use:  Not  Applicable Psych involvement (Current and /or in the community):  No (Comment)  Discharge Needs  Concerns to be addressed:  Care Coordination Readmission within the last 30 days:  No Current discharge risk:  Physical Impairment Barriers to Discharge:  Continued Medical Work up   Cranford Mon, LCSW 11/12/2015, 4:42 PM

## 2015-11-12 NOTE — Progress Notes (Signed)
Pharmacy Antibiotic Note  [name] is a 55 y.o. female admitted on 11/11/2015 with sepsis with likely UTI.  Pharmacy managing vancomycin/Zosyn dosing.  WBC improved to 16.3, Tm 100.8. CrCl improved to  70 mL/min with hydration  Plan: Increase Vanc to 750 mg q12h VT at steady state Zosyn 3.375 g IV q8h F/u CMet for renal function and additional abx doses F/u cx, s/sx clinical improvement   Weight: 132 lb (59.875 kg)  Temp (24hrs), Avg:99 F (37.2 C), Min:97.1 F (36.2 C), Max:100.8 F (38.2 C)   Recent Labs Lab 11/11/15 0929 11/11/15 1444 11/11/15 1446 11/11/15 1447 11/11/15 1627 11/12/15 0400 11/12/15 0809  WBC 26.8*  --   --   --   --  16.3*  --   CREATININE 1.39*  --   --  1.27*  --  0.82  --   LATICACIDVEN  --  5.42* 4.8*  --  5.5* 2.5* 1.3    Estimated Creatinine Clearance: 70.9 mL/min (by C-G formula based on Cr of 0.82).    No Known Allergies  Antimicrobials this admission: 5/31 vancomycin >>  5/31 Zosyn >>   Dose adjustments this admission: n/a  Microbiology results: 5/31 BCx: 1/2 CNS  5/31 UCx: ngtd   Thank you for allowing pharmacy to be a part of this patient's care.  Albertina Parr, PharmD., BCPS Clinical Pharmacist Pager [phone]

## 2015-11-12 NOTE — Evaluation (Signed)
Physical Therapy Evaluation Patient Details Name: [name] MRN: [id number] DOB: 1961-05-12 Today's Date: 11/12/2015   History of Present Illness  This 55 y.o. female admitted with AMS, hypotension.  She was found to have UTI/sepsis.  PMH includes MS, DM, h/ sacral decubitus ulcer, CKD, h/o DVT (on Xarelto.    Clinical Impression  Pt admitted with above diagnosis. Pt currently with functional limitations due to the deficits listed below (see PT Problem List). PTA staff at [facility] pt to [facility] using hoyer lift.  She appears to be at her baseline level of functioning and has no acute PT needs.  May benefit from trial of PT at SNF to maximize independence.  PT is signing off.    Follow Up Recommendations SNF;Supervision/Assistance - 24 hour    Equipment Recommendations  None recommended by PT    Recommendations for Other Services       Precautions / Restrictions Precautions Precautions: Fall Restrictions Weight Bearing Restrictions: No      Mobility  Bed Mobility Overal bed mobility: Needs Assistance Bed Mobility: Rolling Rolling: Total assist         General bed mobility comments: Did not attempt today as pt declines due to pain  Transfers Overall transfer level: Needs assistance               General transfer comment: Pt has been a dependent transfer since at least 2/17.  The staff at [facility] use hoyer lift to assist her to [facility]  Ambulation/Gait                Stairs            Wheelchair Mobility    Modified Rankin (Stroke Patients Only)       Balance                                             Pertinent Vitals/Pain Pain Assessment: Faces Faces Pain Scale: Hurts even more Pain Location: Rt hand and LEs Pain Descriptors / Indicators: Aching;Grimacing;Guarding Pain Intervention(s): Monitored during session    Home Living Family/patient expects to be discharged to:: Skilled nursing facility                      Prior Function Level of Independence: Needs assistance   Gait / Transfers Assistance Needed: Lift used at [facility] to assist pt to [facility]  ADL's / Homemaking Assistance Needed: Has been feeding herself using her Lt hand for the past year (was Rt handed).  Pt is dependent with all other aspects of ADLs        Hand Dominance   Dominant Hand: Right (but unable to use Rt UE functionally so has switched to usin)    Extremity/Trunk Assessment   Upper Extremity Assessment: Defer to OT evaluation RUE Deficits / Details: Pt will not allow therapist to move Rt UE and will not attempt ROM due to pain from IV.   fingers maintained in flexed position      LUE Deficits / Details: Pt is able to flex shoulder to ~80*.  elbow grossly 3+/5 hand grossly 3+/5.  Pt reports she was able to feed self with Rt UE    Lower Extremity Assessment: RLE deficits/detail;LLE deficits/detail RLE Deficits / Details: Resting in PF.  Painful w/ PROM. LLE Deficits / Details: Inverted and pain at ankle w/ PROM DF.  Did not allow PT to mobilize Lt LE due to pain.     Communication   Communication: No difficulties  Cognition Arousal/Alertness: Awake/alert Behavior During Therapy: Flat affect (irritable ) Overall Cognitive Status: Impaired/Different from baseline (Pt with impaired orientation )                      General Comments General comments (skin integrity, edema, etc.): Pt refused EOB activity    Exercises        Assessment/Plan    PT Assessment All further PT needs can be met in the next venue of care  PT Diagnosis Difficulty walking;Generalized weakness;Acute pain   PT Problem List Decreased strength;Decreased activity tolerance;Decreased range of motion;Decreased balance;Decreased mobility;Decreased coordination;Decreased cognition;Decreased knowledge of use of DME;Decreased safety awareness;Pain  PT Treatment Interventions     PT Goals (Current goals can be found in the Care  Plan section) Acute Rehab PT Goals Patient Stated Goal: to get back home and use power w/c  PT Goal Formulation: All assessment and education complete, DC therapy    Frequency     Barriers to discharge        Co-evaluation PT/OT/SLP Co-Evaluation/Treatment: Yes Reason for Co-Treatment: Complexity of the patient's impairments (multi-system involvement);For patient/therapist safety           End of Session       Nurse Communication: Mobility status;Need for lift equipment (Pt would benefit from Bil PRAFO boots ordered)         Time: 7530-0511 PT Time Calculation (min) (ACUTE ONLY): 14 min   Charges:   PT Evaluation $PT Eval Moderate Complexity: 1 Procedure     PT G Codes:       Collie Siad PT, DPT  Pager: [phone] Phone: [phone] 11/12/2015, 4:53 PM

## 2015-11-12 NOTE — Progress Notes (Addendum)
Initial Nutrition Assessment  DOCUMENTATION CODES:   Non-severe (moderate) malnutrition in context of chronic illness  INTERVENTION:   Ensure Enlive po BID, each supplement provides 350 kcal and 20 grams of protein  Continue Prostat liquid protein po 30 ml BID with meals, each supplement provides 100 kcal, 15 grams protein  NUTRITION DIAGNOSIS:   Malnutrition related to chronic illness as evidenced by moderate depletion of body fat, moderate depletions of muscle mass  GOAL:   Patient will meet greater than or equal to 90% of their needs  MONITOR:   PO intake, Supplement acceptance, Labs, Weight trends, Skin, I & O's  REASON FOR ASSESSMENT:   Consult Assessment of nutrition requirement/status  ASSESSMENT:   55 yo Female with PMH significant of MS, chronic decubitus ulcers, C KD stage III, protein calorie malnutrition, bedbound, chronic UTIs with indwelling Foley catheter presenting from the nursing home after patient was noted to be somnolent and confused.   Patient sleeping upon RD visit. Identified with malnutrition during previous hospitalization (Feburary 2017)  >> ongoing. PO intake variable at 50-90% per flowsheet records. PTA medication list reviewed >> pt takes Prostat 30 ml BID >> orders in place.  Nutrition-Focused physical exam completed. Findings are moderate fat depletion, moderate muscle depletion, and no edema.   Diet Order:  Diet Heart Room service appropriate?: Yes; Fluid consistency:: Thin  Skin:   Per CWOCN note 07/21/2015:  R heel peeling tissue Stage 3 wound to R ischial tuberosity Stage 3 wound to L ischial tuberosity  Sacrum intact with macerated tissues  Last BM:  5/30  Height:   Ht Readings from Last 1 Encounters:  10/29/15 5\' 6"  (1.676 m)    Weight:   Wt Readings from Last 1 Encounters:  11/11/15 132 lb (59.875 kg)    Ideal Body Weight:  59 kg  BMI:  Body mass index is 21.32 kg/(m^2).  Estimated Nutritional Needs:   Kcal:   1700-1900  Protein:  90-100 gm  Fluid:  1.7-1.9 L  EDUCATION NEEDS:   No education needs identified at this time  Arthur Holms, RD, LDN Pager #: [phone] After-Hours Pager #: [phone]

## 2015-11-12 NOTE — Progress Notes (Signed)
Lactic acid, WBC trending down.  Renal fx and blood pressure better.  PCCM will sign off.  Please call if additional help needed.  Chesley Mires, MD [facility] Pulmonary/Critical Care 11/12/2015, 6:08 AM Pager:  [phone] After 3pm call: [phone]

## 2015-11-12 NOTE — Progress Notes (Signed)
PROGRESS NOTE    [name]  [id number] DOB: 10-23-1960 DOA: 11/11/2015 PCP: Gildardo Cranker, DO   Brief Narrative:  55 y.o. BF PMHx  MS, Chronic Decubitus ulcers, C KD stage III, Protein Calorie Malnutrition, Bedbound, Chronic UTIs with indwelling Foley catheter, DM type II with complication, DVT   presenting from the nursing home after patient was noted to be somnolent and confused.   Level V caveat applies as patient is unable to provide reliable history. Patient is alert and oriented to person and place only. Answers are somewhat coherent but at other times cannot is fairly gibberish. History provided by EDP, nursing home report, and family members via phone conversation. At 7:15 this morning patient was noted by nursing home staff to be confused and to have a blood pressure of 71/47 with pulse of 71, temperature 99.3 and respirations 16 with 95% O2 sats. Per report patient had vomited a couple of times a day before but was otherwise in her normal state of health. Patient was treated with Phenergan after the emesis with resolution. Currently patient has no complaints. Prior to this event patient was in her normal state of health. EMS was contacted, peripheral IV and fluids were started, and patient was transported to [facility] ED. Per report patient is normally completely alert and oriented.   Assessment & Plan:   Active Problems:   Multiple sclerosis diagnosed 2002-not on Therapy any longer   Acute encephalopathy   Diabetes mellitus type 2, controlled ([place])   UTI (lower urinary tract infection)   Sepsis (HCC)   Lower extremity edema   Chronic indwelling Foley catheter   AKI (acute kidney injury) ([place])   Neurogenic bladder   Protein calorie malnutrition (HCC)   Physical deconditioning   HLD (hyperlipidemia)   Sepsis, unspecified organism ([place])   Protein-calorie malnutrition ([place])   Decubitus ulcer   Controlled diabetes mellitus type 2 with complications ([place])   Sepsis  unspecified organism:  -Most likely from chronic indwelling catheter - Continue vancomycin and Zosyn - CXR, KUB (also complaining of Abd pain)  Acute renal failure (baseline Cr~ 0.9.) Lab Results  Component Value Date   CREATININE 0.82 11/12/2015   CREATININE 1.27* 11/11/2015   CREATININE 1.39* 11/11/2015  -Improving with hydration; normal saline 136ml/hr  Neurogenic bladder with Chronic indwelling Foley catheter.  -This is been the source of the patient's last 2 admissions. Foley changed at time of admission (per patient~1 week ago). - Consider urology consult - Discharge patient on chronic prophylactic UTI antibiotics - Continue Vesicare and Foley catheter  Multiple sclerosis -Not on medication  Protein calorie malnutrition/physical deconditioning:  -Multifactorial to include chronic MS (No MS medications). Bedbound. -Nutrition consult - Pro stat feeding supplement - Continue baclofen for contractures  Several superficial dime sized decubitus ulcers on bilat buttocks." -Per ED note  - PT/OT - Air overlay - wound care  DM type II controlled with complication:  -[id number] hemoglobin A1c= 5.9 - Hold metformin - Sensitive SSI  DVT: - continue xarelto  Dependent edema: -Patient on Lasix without diagnoses of CHF. No indication for this in patient's history of lower extremity edema - Echocardiogram pending - Hold Lasix due to hypotension  HLD: - Continue statin]  Hypokalemia -K Dur 40 mEq BID -Recheck potassium and magnesium 1600  Constipation -MiraLAX BID  Goals of care -Talked with patient extensively about the difference between DO NOT RESUSCITATE and full code, and after answering patient's questions she decided to be made DO NOT RESUSCITATE   DVT  prophylaxis: Eliquis Code Status: DO NOT RESUSCITATE Family Communication: None available Disposition Plan: Resolution sepsis   Consultants:  None  Procedures/Significant Events:  5/31 KUB;moderate amount of  stool in the colon. Gas is present in nondilated loops of small bowel without evidence of obstruction. 5/31 PCXR;-Retro diaphragmatic density on the left with mild blunting of the left lateral costophrenic angle, potentially from left pleural effusion and/or left lower lobe atelectasis/pneumonia. Stable.   Cultures 5/31 blood right hand positive GNR, GPC 5/31 blood right AC NGTD 5/31 urine positive multiple species 5/31 MRSA by PCR negative   Antimicrobials: 5/31 Zosyn>> 5/31 vancomycin>>   Devices    LINES / TUBES:  -Chronic Foley    Continuous Infusions: . sodium chloride 100 mL/hr at 11/12/15 1307     Subjective: 6/1A/O 4, NAD. States Foley last changed~1 week ago. Positive abdominal discomfort   Objective: Filed Vitals:   11/12/15 0750 11/12/15 1200 11/12/15 1500 11/12/15 1600  BP: 115/64 101/59 101/61 101/61  Pulse: 116 106    Temp: 100.8 F (38.2 C) 99.1 F (37.3 C)  99.2 F (37.3 C)  TempSrc: Oral Oral  Oral  Resp: 27 25 24 19   Weight:      SpO2: 96% 100%  100%    Intake/Output Summary (Last 24 hours) at 11/12/15 1939 Last data filed at 11/12/15 1706  Gross per 24 hour  Intake   3408 ml  Output   2150 ml  Net   1258 ml   Filed Weights   11/11/15 1011  Weight: 59.875 kg (132 lb)    Examination:  General: A/O 4, NAD, No acute respiratory distress Eyes: negative scleral hemorrhage, negative anisocoria, negative icterus ENT: Negative Runny nose, negative gingival bleeding, Neck:  Negative scars, masses, torticollis, lymphadenopathy, JVD Lungs: Clear to auscultation bilaterally without wheezes or crackles Cardiovascular: Regular rate and rhythm without murmur gallop or rub normal S1 and S2 Abdomen: Positive suprapubic abdominal pain to palpation, nondistended, positive soft, bowel sounds, no rebound, no ascites, no appreciable mass, positive bilateral CVA tenderness Extremities: No significant cyanosis, clubbing, or edema bilateral lower  extremities Skin: Negative rashes, lesions, ulcers Psychiatric:  Negative depression, negative anxiety, negative fatigue, negative mania  Central nervous system:  Cranial nerves II through XII intact, tongue/uvula midline, all extremities muscle strength 5/5, sensation intact throughout, negative dysarthria, negative expressive aphasia, negative receptive aphasia.  .     Data Reviewed: Care during the described time interval was provided by me .  I have reviewed this patient's available data, including medical history, events of note, physical examination, and all test results as part of my evaluation. I have personally reviewed and interpreted all radiology studies.  CBC:  Recent Labs Lab 11/11/15 0929 11/12/15 0400  WBC 26.8* 16.3*  NEUTROABS 24.4*  --   HGB 9.4* 7.9*  HCT 29.7* 25.5*  MCV 86.8 87.9  PLT 478* [id number]   Basic Metabolic Panel:  Recent Labs Lab 11/11/15 0929 11/11/15 1447 11/12/15 0400  NA 141  --  143  K 4.2  --  3.0*  CL 111  --  115*  CO2 20*  --  20*  GLUCOSE 166*  --  117*  BUN 53*  --  32*  CREATININE 1.39* 1.27* 0.82  CALCIUM 10.4*  --  9.3  MG  --  1.7  --   PHOS  --  4.3  --    GFR: Estimated Creatinine Clearance: 70.9 mL/min (by C-G formula based on Cr of 0.82). Liver Function Tests:  Recent Labs Lab 11/11/15 0929 11/12/15 0400  AST 14* 17  ALT 11* 13*  ALKPHOS 45 54  BILITOT 0.5 0.5  PROT 7.3 6.3*  ALBUMIN 2.9* 2.3*   No results for input(s): LIPASE, AMYLASE in the last 168 hours. No results for input(s): AMMONIA in the last 168 hours. Coagulation Profile:  Recent Labs Lab 11/11/15 1447  INR 1.86*   Cardiac Enzymes:  Recent Labs Lab 11/11/15 1627  TROPONINI <0.03   BNP (last 3 results) No results for input(s): PROBNP in the last 8760 hours. HbA1C:  Recent Labs  11/11/15 1448  HGBA1C 5.9*   CBG:  Recent Labs Lab 11/11/15 1701 11/11/15 2121 11/12/15 0751 11/12/15 1235 11/12/15 1703  GLUCAP 131* 163* 117*  132* 131*   Lipid Profile: No results for input(s): CHOL, HDL, LDLCALC, TRIG, CHOLHDL, LDLDIRECT in the last 72 hours. Thyroid Function Tests: No results for input(s): TSH, T4TOTAL, FREET4, T3FREE, THYROIDAB in the last 72 hours. Anemia Panel: No results for input(s): VITAMINB12, FOLATE, FERRITIN, TIBC, IRON, RETICCTPCT in the last 72 hours. Urine analysis:    Component Value Date/Time   COLORURINE YELLOW 11/11/2015 1011   APPEARANCEUR TURBID* 11/11/2015 1011   LABSPEC 1.017 11/11/2015 1011   PHURINE 7.0 11/11/2015 1011   GLUCOSEU NEGATIVE 11/11/2015 1011   HGBUR LARGE* 11/11/2015 [place] 11/11/2015 1011   KETONESUR NEGATIVE 11/11/2015 1011   PROTEINUR 30* 11/11/2015 1011   UROBILINOGEN 0.2 12/20/2014 1851   NITRITE POSITIVE* 11/11/2015 1011   LEUKOCYTESUR LARGE* 11/11/2015 1011   Sepsis Labs: @LABRCNTIP (procalcitonin:4,lacticidven:4)  ) Recent Results (from the past 240 hour(s))  Blood Culture (routine x 2)     Status: None (Preliminary result)   Collection Time: 11/11/15  9:00 AM  Result Value Ref Range Status   Specimen Description BLOOD RIGHT HAND  Final   Special Requests BOTTLES DRAWN AEROBIC AND ANAEROBIC 5CC  Final   Culture  Setup Time   Final    GRAM NEGATIVE RODS GRAM POSITIVE COCCI AEROBIC BOTTLE ONLY CRITICAL RESULT CALLED TO, READ BACK BY AND VERIFIED WITH: CLamont Snowball D AT 0916 ON [id number] BY SKY    Culture TOO YOUNG TO READ  Final   Report Status PENDING  Incomplete  Blood Culture ID Panel (Reflexed)     Status: Abnormal   Collection Time: 11/11/15  9:00 AM  Result Value Ref Range Status   Enterococcus species NOT DETECTED NOT DETECTED Final   Vancomycin resistance NOT DETECTED NOT DETECTED Final   Listeria monocytogenes NOT DETECTED NOT DETECTED Final   Staphylococcus species DETECTED (A) NOT DETECTED Final    Comment: CRITICAL RESULT CALLED TO, READ BACK BY AND VERIFIED WITH: C. STEWART, PHARM D AT 0916 ON [id number] BY S.  YARBROUGH    Staphylococcus aureus NOT DETECTED NOT DETECTED Final   Methicillin resistance DETECTED (A) NOT DETECTED Final    Comment: CRITICAL RESULT CALLED TO, READ BACK BY AND VERIFIED WITH: C. STEWART, PHARM D AT 0916 ON [id number] BY S. YARBROUGH    Streptococcus species NOT DETECTED NOT DETECTED Final   Streptococcus agalactiae NOT DETECTED NOT DETECTED Final   Streptococcus pneumoniae NOT DETECTED NOT DETECTED Final   Streptococcus pyogenes NOT DETECTED NOT DETECTED Final   Acinetobacter baumannii NOT DETECTED NOT DETECTED Final   Enterobacteriaceae species NOT DETECTED NOT DETECTED Final   Enterobacter cloacae complex NOT DETECTED NOT DETECTED Final   Escherichia coli NOT DETECTED NOT DETECTED Final   Klebsiella oxytoca NOT DETECTED NOT DETECTED Final  Klebsiella pneumoniae NOT DETECTED NOT DETECTED Final   Proteus species NOT DETECTED NOT DETECTED Final   Serratia marcescens NOT DETECTED NOT DETECTED Final   Carbapenem resistance NOT DETECTED NOT DETECTED Final   Haemophilus influenzae NOT DETECTED NOT DETECTED Final   Neisseria meningitidis NOT DETECTED NOT DETECTED Final   Pseudomonas aeruginosa NOT DETECTED NOT DETECTED Final   Candida albicans NOT DETECTED NOT DETECTED Final   Candida glabrata NOT DETECTED NOT DETECTED Final   Candida krusei NOT DETECTED NOT DETECTED Final   Candida parapsilosis NOT DETECTED NOT DETECTED Final   Candida tropicalis NOT DETECTED NOT DETECTED Final  Blood Culture (routine x 2)     Status: None (Preliminary result)   Collection Time: 11/11/15  9:10 AM  Result Value Ref Range Status   Specimen Description BLOOD RIGHT ANTECUBITAL  Final   Special Requests   Final    BOTTLES DRAWN AEROBIC AND ANAEROBIC 10CC AER 5CC ANA   Culture NO GROWTH 1 DAY  Final   Report Status PENDING  Incomplete  Urine culture     Status: Abnormal   Collection Time: 11/11/15 10:11 AM  Result Value Ref Range Status   Specimen Description URINE, RANDOM  Final    Special Requests NONE  Final   Culture MULTIPLE SPECIES PRESENT, SUGGEST RECOLLECTION (A)  Final   Report Status 11/12/2015 FINAL  Final  MRSA PCR Screening     Status: None   Collection Time: 11/11/15  4:21 PM  Result Value Ref Range Status   MRSA by PCR NEGATIVE NEGATIVE Final    Comment:        The GeneXpert MRSA Assay (FDA approved for NASAL specimens only), is one component of a comprehensive MRSA colonization surveillance program. It is not intended to diagnose MRSA infection nor to guide or monitor treatment for MRSA infections.          Radiology Studies: Dg Abd 1 View  11/11/2015  CLINICAL DATA:  Abdominal pain.  Sepsis. EXAM: ABDOMEN - 1 VIEW COMPARISON:  Renal ultrasound 07/21/2015. CT abdomen and pelvis 12/15/2014. FINDINGS: Two adjacent oblong calcifications in the left upper abdomen measure 2-2.5 cm each and are consistent with known left renal calculi. There is a moderate amount of stool in the colon. Gas is present in nondilated loops of small bowel without evidence of obstruction. No gross intraperitoneal free air on this supine examination. Likely phleboliths in the pelvis. No acute osseous abnormality identified. IMPRESSION: 1. Nonobstructed bowel gas pattern. 2. Left renal calculi. Electronically Signed   By: Logan Bores M.D.   On: 11/11/2015 13:11   Dg Chest Port 1 View  11/11/2015  CLINICAL DATA:  Sepsis EXAM: PORTABLE CHEST 1 VIEW COMPARISON:  08/31/2015 FINDINGS: Low lung volumes are present, causing crowding of the pulmonary vasculature. Borderline enlargement of the cardiopericardial silhouette accounting for the low lung volumes and AP projection. Stable mild blunting of the left lateral costophrenic angle. Stable mild left retro diaphragmatic density compared to the right. IMPRESSION: 1. Retro diaphragmatic density on the left with mild blunting of the left lateral costophrenic angle, potentially from left pleural effusion and/or left lower lobe  atelectasis/pneumonia. This appears stable. Electronically Signed   By: Van Clines M.D.   On: 11/11/2015 13:12        Scheduled Meds: . baclofen  10 mg Oral TID  . darifenacin  7.5 mg Oral Daily  . feeding supplement (ENSURE ENLIVE)  237 mL Oral BID BM  . feeding supplement (PRO-STAT SUGAR FREE 64)  30 mL Oral BID  . gabapentin  100 mg Oral QHS  . insulin aspart  0-5 Units Subcutaneous QHS  . insulin aspart  0-9 Units Subcutaneous TID WC  . piperacillin-tazobactam (ZOSYN)  IV  3.375 g Intravenous Q8H  . polyethylene glycol  17 g Oral BID  . potassium chloride  40 mEq Oral BID  . pravastatin  20 mg Oral q1800  . rivaroxaban  20 mg Oral Q supper  . sodium chloride flush  3 mL Intravenous Q12H  . vancomycin  750 mg Intravenous Q12H   Continuous Infusions: . sodium chloride 100 mL/hr at 11/12/15 1307     LOS: 1 day    Time spent: 40 minutes     Lahela Woodin, Geraldo Docker, MD [facility] Pager [phone]   If 7PM-7AM, please contact night-coverage [email or web address] Password [facility] 11/12/2015, 7:39 PM

## 2015-11-12 NOTE — NC FL2 (Signed)
[place] LEVEL OF CARE SCREENING TOOL     IDENTIFICATION  Patient Name: [name] Birthdate: 10/21/1960 Sex: female Admission Date (Current Location): 11/11/2015  [facility] and [place] Number:  Herbalist and Address:  [facility], [place] [address], [place], [place] [address]      Provider Number: [id number]  Attending Physician Name and Address:  Allie Bossier, MD  Relative Name and Phone Number:  [name] [phone]    Current Level of Care: Hospital Recommended Level of Care: [place] Prior Approval Number:    Date Approved/Denied:   PASRR Number:    Discharge Plan: SNF    Current Diagnoses: Patient Active Problem List   Diagnosis Date Noted  . AKI (acute kidney injury) ([place]) 11/11/2015  . Neurogenic bladder 11/11/2015  . Protein calorie malnutrition ([place]) 11/11/2015  . Physical deconditioning 11/11/2015  . HLD (hyperlipidemia) 11/11/2015  . Sepsis due to urinary tract infection ([place])   . Chronic indwelling Foley catheter 09/30/2015  . Lower extremity edema 09/04/2015  . Hypokalemia 09/04/2015  . UTI (lower urinary tract infection) 08/31/2015  . Sepsis ([place]) 08/31/2015  . Hypernatremia 08/31/2015  . Dyslipidemia associated with type 2 diabetes mellitus ([place]) 08/28/2015  . GERD without esophagitis 08/28/2015  . Acute renal failure superimposed on stage 3 chronic kidney disease ([place]) 07/21/2015  . Leukocytosis 07/21/2015  . Anemia of chronic disease 07/21/2015  . Hyperkalemia 07/21/2015  . UTI (urinary tract infection) 07/21/2015  . Malnutrition of moderate degree 07/21/2015  . Stage 4 decubitus ulcer ([place]) 07/05/2015  . Diabetes mellitus type 2, controlled ([place]) 07/05/2015  . Acute encephalopathy 12/28/2014  . DVT, lower extremity ([place]) 12/28/2014  . Multiple sclerosis diagnosed 2002-not on Therapy any longer 06/26/2012    Orientation RESPIRATION BLADDER Height & Weight     Self, Time,  Place  Normal Indwelling catheter Weight: 132 lb (59.875 kg) Height:     BEHAVIORAL SYMPTOMS/MOOD NEUROLOGICAL BOWEL NUTRITION STATUS      Incontinent Diet (cardiac)  AMBULATORY STATUS COMMUNICATION OF NEEDS Skin   Extensive Assist Verbally Normal                       Personal Care Assistance Level of Assistance  Bathing, Dressing, Feeding Bathing Assistance: Maximum assistance Feeding assistance: Limited assistance Dressing Assistance: Maximum assistance     Functional Limitations Info             SPECIAL CARE FACTORS FREQUENCY                       Contractures      Additional Factors Info  Code Status, Allergies, Insulin Sliding Scale Code Status Info: FULL Allergies Info: NKA   Insulin Sliding Scale Info: 4/day       Current Medications (11/12/2015):  This is the current hospital active medication list Current Facility-Administered Medications  Medication Dose Route Frequency Provider Last Rate Last Dose  . 0.9 %  sodium chloride infusion   Intravenous Continuous Waldemar Dickens, MD 100 mL/hr at 11/12/15 1307    . albuterol (PROVENTIL) (2.5 MG/3ML) 0.083% nebulizer solution 3 mL  3 mL Inhalation Q4H PRN Waldemar Dickens, MD      . baclofen (LIORESAL) tablet 10 mg  10 mg Oral TID Waldemar Dickens, MD   10 mg at 11/12/15 1619  . darifenacin (ENABLEX) 24 hr tablet 7.5 mg  7.5 mg Oral Daily Waldemar Dickens,  MD   7.5 mg at 11/12/15 0911  . feeding supplement (ENSURE ENLIVE) (ENSURE ENLIVE) liquid 237 mL  237 mL Oral BID BM Allie Bossier, MD   237 mL at 11/12/15 1500  . feeding supplement (PRO-STAT SUGAR FREE 64) liquid 30 mL  30 mL Oral BID Waldemar Dickens, MD   30 mL at 11/12/15 0911  . gabapentin (NEURONTIN) capsule 100 mg  100 mg Oral QHS Waldemar Dickens, MD   100 mg at 11/11/15 2035  . insulin aspart (novoLOG) injection 0-5 Units  0-5 Units Subcutaneous QHS Waldemar Dickens, MD   0 Units at 11/11/15 2200  . insulin aspart (novoLOG) injection 0-9 Units  0-9  Units Subcutaneous TID WC Waldemar Dickens, MD   1 Units at 11/12/15 1306  . morphine 2 MG/ML injection 2 mg  2 mg Intravenous Q2H PRN Waldemar Dickens, MD      . ondansetron [facility]) tablet 4 mg  4 mg Oral Q6H PRN Waldemar Dickens, MD       Or  . ondansetron [facility]) injection 4 mg  4 mg Intravenous Q6H PRN Waldemar Dickens, MD      . piperacillin-tazobactam (ZOSYN) IVPB 3.375 g  3.375 g Intravenous Q8H [name], RPH 12.5 mL/hr at 11/12/15 1310 3.375 g at 11/12/15 1310  . potassium chloride SA (K-DUR,KLOR-CON) CR tablet 40 mEq  40 mEq Oral BID Allie Bossier, MD   40 mEq at 11/12/15 0911  . pravastatin (PRAVACHOL) tablet 20 mg  20 mg Oral q1800 Waldemar Dickens, MD   20 mg at 11/11/15 1742  . rivaroxaban (XARELTO) tablet 20 mg  20 mg Oral Q supper Waldemar Dickens, MD   20 mg at 11/11/15 1742  . sodium chloride flush (NS) 0.9 % injection 3 mL  3 mL Intravenous Q12H Waldemar Dickens, MD   3 mL at 11/12/15 1307  . vancomycin (VANCOCIN) IVPB 750 mg/150 ml premix  750 mg Intravenous Q12H Lavenia Atlas, [facility]         Discharge Medications: Please see discharge summary for a list of discharge medications.  Relevant Imaging Results:  Relevant Lab Results:   Additional Information SSN: [id number]  Cranford Mon, [place]

## 2015-11-13 ENCOUNTER — Other Ambulatory Visit (HOSPITAL_COMMUNITY): Payer: Medicare Other

## 2015-11-13 ENCOUNTER — Inpatient Hospital Stay (HOSPITAL_COMMUNITY): Payer: Medicare Other

## 2015-11-13 DIAGNOSIS — R509 Fever, unspecified: Secondary | ICD-10-CM

## 2015-11-13 LAB — GLUCOSE, CAPILLARY
GLUCOSE-CAPILLARY: 216 mg/dL — AB (ref 65–99)
Glucose-Capillary: 120 mg/dL — ABNORMAL HIGH (ref 65–99)
Glucose-Capillary: 226 mg/dL — ABNORMAL HIGH (ref 65–99)
Glucose-Capillary: 266 mg/dL — ABNORMAL HIGH (ref 65–99)

## 2015-11-13 LAB — BASIC METABOLIC PANEL
Anion gap: 5 (ref 5–15)
BUN: 14 mg/dL (ref 6–20)
CHLORIDE: 115 mmol/L — AB (ref 101–111)
CO2: 22 mmol/L (ref 22–32)
CREATININE: 0.71 mg/dL (ref 0.44–1.00)
Calcium: 9.5 mg/dL (ref 8.9–10.3)
GFR calc non Af Amer: >60 mL/min (ref >60)
Glucose, Bld: 211 mg/dL — ABNORMAL HIGH (ref 65–99)
POTASSIUM: 3.4 mmol/L — AB (ref 3.5–5.1)
SODIUM: 142 mmol/L (ref 135–145)

## 2015-11-13 LAB — COMPREHENSIVE METABOLIC PANEL
ALT: 18 U/L (ref 14–54)
AST: 19 U/L (ref 15–41)
Albumin: 2.3 g/dL — ABNORMAL LOW (ref 3.5–5.0)
Alkaline Phosphatase: 77 U/L (ref 38–126)
Anion gap: 7 (ref 5–15)
BUN: 16 mg/dL (ref 6–20)
CALCIUM: 9.8 mg/dL (ref 8.9–10.3)
CHLORIDE: 115 mmol/L — AB (ref 101–111)
CO2: 20 mmol/L — AB (ref 22–32)
Creatinine, Ser: 0.62 mg/dL (ref 0.44–1.00)
GLUCOSE: 123 mg/dL — AB (ref 65–99)
POTASSIUM: 3.4 mmol/L — AB (ref 3.5–5.1)
Sodium: 142 mmol/L (ref 135–145)
Total Bilirubin: 0.5 mg/dL (ref 0.3–1.2)
Total Protein: 6.9 g/dL (ref 6.5–8.1)

## 2015-11-13 LAB — CBC WITH DIFFERENTIAL/PLATELET
BASOS PCT: 0 %
Basophils Absolute: 0 10*3/uL (ref 0.0–0.1)
Eosinophils Absolute: 0.3 10*3/uL (ref 0.0–0.7)
Eosinophils Relative: 2 %
HEMATOCRIT: 28.5 % — AB (ref 36.0–46.0)
HEMOGLOBIN: 8.8 g/dL — AB (ref 12.0–15.0)
LYMPHS ABS: 2.2 10*3/uL (ref 0.7–4.0)
LYMPHS PCT: 12 %
MCH: 27 pg (ref 26.0–34.0)
MCHC: 30.9 g/dL (ref 30.0–36.0)
MCV: 87.4 fL (ref 78.0–100.0)
MONOS PCT: 3 %
Monocytes Absolute: 0.5 10*3/uL (ref 0.1–1.0)
NEUTROS ABS: 15.4 10*3/uL — AB (ref 1.7–7.7)
NEUTROS PCT: 83 %
Platelets: 381 10*3/uL (ref 150–400)
RBC: 3.26 MIL/uL — ABNORMAL LOW (ref 3.87–5.11)
RDW: 18.8 % — ABNORMAL HIGH (ref 11.5–15.5)
WBC: 18.4 10*3/uL — ABNORMAL HIGH (ref 4.0–10.5)

## 2015-11-13 LAB — ECHOCARDIOGRAM COMPLETE: WEIGHTICAEL: 2433.88 [oz_av]

## 2015-11-13 LAB — ALBUMIN: ALBUMIN: 2.4 g/dL — AB (ref 3.5–5.0)

## 2015-11-13 LAB — PREALBUMIN: Prealbumin: 10.7 mg/dL — ABNORMAL LOW (ref 18–38)

## 2015-11-13 LAB — PHOSPHORUS: Phosphorus: 1.8 mg/dL — ABNORMAL LOW (ref 2.5–4.6)

## 2015-11-13 LAB — MAGNESIUM: Magnesium: 1.7 mg/dL (ref 1.7–2.4)

## 2015-11-13 MED ORDER — MAGNESIUM CITRATE PO SOLN
0.5000 | Freq: Once | ORAL | Status: AC
  Administered 2015-11-13: 0.5 via ORAL
  Filled 2015-11-13: qty 296

## 2015-11-13 MED ORDER — PERFLUTREN LIPID MICROSPHERE
1.0000 mL | INTRAVENOUS | Status: AC | PRN
  Administered 2015-11-13: 2 mL via INTRAVENOUS
  Filled 2015-11-13: qty 10

## 2015-11-13 MED ORDER — K PHOS MONO-SOD PHOS DI & MONO 155-852-130 MG PO TABS
500.0000 mg | ORAL_TABLET | Freq: Two times a day (BID) | ORAL | Status: AC
  Administered 2015-11-13 (×2): 500 mg via ORAL
  Filled 2015-11-13 (×2): qty 2

## 2015-11-13 MED ORDER — PERFLUTREN LIPID MICROSPHERE
INTRAVENOUS | Status: AC
  Filled 2015-11-13: qty 10

## 2015-11-13 NOTE — Clinical Documentation Improvement (Signed)
Hospitalist  Please document query responses in the progress notes and discharge summary, not on the CDI BPA in [facility]. Please do not deactivate queries without responding to them. Thank you!  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 Nursing Daily Cares section of Flowsheets in [facility] indicates that the patient is "total care" regarding her level of assistance.  Please exercise your independent, professional judgment when responding. A specific answer is not anticipated or expected.   Thank You, Erling Conte  RN BSN CCDS [phone] Health Information Management [place]

## 2015-11-13 NOTE — Progress Notes (Signed)
PROGRESS NOTE    [name]  [id number] DOB: 18-Jun-1960 DOA: 11/11/2015 PCP: Patricia Cranker, DO   Brief Narrative:  55 y.o. BF PMHx  MS, Chronic Decubitus ulcers, C KD stage III, Protein Calorie Malnutrition, Bedbound, Chronic UTIs with indwelling Foley catheter, DM type II with complication, DVT   presenting from the nursing home after patient was noted to be somnolent and confused.   Level V caveat applies as patient is unable to provide reliable history. Patient is alert and oriented to person and place only. Answers are somewhat coherent but at other times cannot is fairly gibberish. History provided by EDP, nursing home report, and family members via phone conversation. At 7:15 this morning patient was noted by nursing home staff to be confused and to have a blood pressure of 71/47 with pulse of 71, temperature 99.3 and respirations 16 with 95% O2 sats. Per report patient had vomited a couple of times a day before but was otherwise in her normal state of health. Patient was treated with Phenergan after the emesis with resolution. Currently patient has no complaints. Prior to this event patient was in her normal state of health. EMS was contacted, peripheral IV and fluids were started, and patient was transported to [facility] ED. Per report patient is normally completely alert and oriented.   Assessment & Plan:   Active Problems:   Multiple sclerosis diagnosed 2002-not on Therapy any longer   Acute encephalopathy   Diabetes mellitus type 2, controlled ([place])   UTI (lower urinary tract infection)   Sepsis (HCC)   Lower extremity edema   Chronic indwelling Foley catheter   AKI (acute kidney injury) ([place])   Neurogenic bladder   Protein calorie malnutrition (HCC)   Physical deconditioning   HLD (hyperlipidemia)   Sepsis, unspecified organism ([place])   Protein-calorie malnutrition ([place])   Decubitus ulcer   Controlled diabetes mellitus type 2 with complications ([place])   Sepsis  unspecified organism:  -Most likely from chronic indwelling catheter - Continue vancomycin and Zosyn - CXR, KUB (also complaining of Abd pain)  Acute renal failure (baseline Cr~ 0.9.) Lab Results  Component Value Date   CREATININE 0.62 11/13/2015   CREATININE 0.82 11/12/2015   CREATININE 1.27* 11/11/2015  -Improving with hydration; normal saline 125 ml/hr    Neurogenic bladder with Chronic indwelling Foley catheter.  -This is been the source of the patient's last 2 admissions. Foley changed at time of admission (per patient~1 week ago). - Consider urology consult - Discharge patient on chronic prophylactic UTI antibiotics - Continue Vesicare and Foley catheter  Multiple sclerosis -Not on medication  Protein calorie malnutrition/physical deconditioning:  -Multifactorial to include chronic MS (No MS medications). Bedbound. -Nutrition consult - Pro stat feeding supplement - Continue baclofen for contractures  Several superficial dime sized decubitus ulcers on bilat buttocks." -Per ED note  - PT/OT - Air overlay - wound care  DM type II controlled with complication:  -[id number] hemoglobin A1c= 5.9 - Hold metformin - Sensitive SSI  DVT: - continue xarelto  Dependent edema: -Patient on Lasix without diagnoses of CHF. No indication for this in patient's history of lower extremity edema - Echocardiogram pending - Hold Lasix due to hypotension  HLD: - Continue statin]  Hypokalemia -K-Phos 500 mg 2 doses  Hypophosphatemia -See hypokalemia  Constipation -MiraLAX BID -0.5 bottle magnesium citrate  Goals of care -Talked with patient extensively about the difference between DO NOT RESUSCITATE and full code, and after answering patient's questions she decided to be made  DO NOT RESUSCITATE   DVT prophylaxis: Eliquis Code Status: DO NOT RESUSCITATE Family Communication: None available Disposition Plan: Resolution sepsis   Consultants:   None  Procedures/Significant Events:  5/31 KUB;moderate amount of stool in the colon. Gas is present in nondilated loops of small bowel without evidence of obstruction. 5/31 PCXR;-Retro diaphragmatic density on the left with mild blunting of the left lateral costophrenic angle, potentially from left pleural effusion and/or left lower lobe atelectasis/pneumonia. Stable. 6/2 echocardiogram;Left ventricle: The cavity size was normal. Systolic function was  normal. The estimated ejection fraction was in the range of 60%  to 65%. Wall motion was normal; there were no regional wall  motion abnormalities. Left ventricular diastolic function  parameters were normal.   Cultures 5/31 blood right hand positive GNR, GPC 5/31 blood right AC NGTD 5/31 urine positive multiple species 5/31 MRSA by PCR negative   Antimicrobials: 5/31 Zosyn>> 5/31 vancomycin>>   Devices    LINES / TUBES:  -Chronic Foley    Continuous Infusions: . sodium chloride 100 mL/hr at 11/12/15 2355     Subjective: 6/2  MAXIMUM TEMPERATURE last 24 hours 38.2C, States still has not had a BM, Positive abdominal discomfort    Objective: Filed Vitals:   11/12/15 1954 11/13/15 0000 11/13/15 0400 11/13/15 0500  BP: 111/64 131/66 141/78   Pulse: 101     Temp: 98.2 F (36.8 C) 99 F (37.2 C) 98.9 F (37.2 C)   TempSrc: Oral Axillary Oral   Resp: 21 22 25    Weight:    69 kg (152 lb 1.9 oz)  SpO2: 100% 99% 97%     Intake/Output Summary (Last 24 hours) at 11/13/15 0831 Last data filed at 11/13/15 [id number]  Gross per 24 hour  Intake   3700 ml  Output   2200 ml  Net   1500 ml   Filed Weights   11/11/15 1011 11/13/15 0500  Weight: 59.875 kg (132 lb) 69 kg (152 lb 1.9 oz)    Examination:  General: A/O 4, NAD, No acute respiratory distress Eyes: negative scleral hemorrhage, negative anisocoria, negative icterus ENT: Negative Runny nose, negative gingival bleeding, Neck:  Negative scars, masses,  torticollis, lymphadenopathy, JVD Lungs: Clear to auscultation bilaterally without wheezes or crackles Cardiovascular: Tachycardic, Regular  rhythm without murmur gallop or rub normal S1 and S2 Abdomen: Positive suprapubic abdominal pain to palpation, nondistended, positive soft, bowel sounds, no rebound, no ascites, no appreciable mass, positive bilateral CVA tenderness Rt>>Lt Extremities: No significant cyanosis, clubbing, or edema bilateral lower extremities Skin: Negative rashes, lesions, ulcers Psychiatric:  Negative depression, negative anxiety, negative fatigue, negative mania  Central nervous system:  Cranial nerves II through XII intact, tongue/uvula midline, all extremities muscle strength 5/5, sensation intact throughout, negative dysarthria, negative expressive aphasia, negative receptive aphasia.  .     Data Reviewed: Care during the described time interval was provided by me .  I have reviewed this patient's available data, including medical history, events of note, physical examination, and all test results as part of my evaluation. I have personally reviewed and interpreted all radiology studies.  CBC:  Recent Labs Lab 11/11/15 0929 11/12/15 0400 11/13/15 0544  WBC 26.8* 16.3* 18.4*  NEUTROABS 24.4*  --  15.4*  HGB 9.4* 7.9* 8.8*  HCT 29.7* 25.5* 28.5*  MCV 86.8 87.9 87.4  PLT 478* 382 [id number]   Basic Metabolic Panel:  Recent Labs Lab 11/11/15 0929 11/11/15 1447 11/12/15 0400 11/12/15 2334 11/13/15 0544  NA 141  --  143  --  142  K 4.2  --  3.0*  --  3.4*  CL 111  --  115*  --  115*  CO2 20*  --  20*  --  20*  GLUCOSE 166*  --  117*  --  123*  BUN 53*  --  32*  --  16  CREATININE 1.39* 1.27* 0.82  --  0.62  CALCIUM 10.4*  --  9.3  --  9.8  MG  --  1.7  --   --  1.7  PHOS  --  4.3  --  1.8*  --    GFR: Estimated Creatinine Clearance: 72.6 mL/min (by C-G formula based on Cr of 0.62). Liver Function Tests:  Recent Labs Lab 11/11/15 0929 11/12/15 0400  11/12/15 2334 11/13/15 0544  AST 14* 17  --  19  ALT 11* 13*  --  18  ALKPHOS 45 54  --  77  BILITOT 0.5 0.5  --  0.5  PROT 7.3 6.3*  --  6.9  ALBUMIN 2.9* 2.3* 2.4* 2.3*   No results for input(s): LIPASE, AMYLASE in the last 168 hours. No results for input(s): AMMONIA in the last 168 hours. Coagulation Profile:  Recent Labs Lab 11/11/15 1447  INR 1.86*   Cardiac Enzymes:  Recent Labs Lab 11/11/15 1627  TROPONINI <0.03   BNP (last 3 results) No results for input(s): PROBNP in the last 8760 hours. HbA1C:  Recent Labs  11/11/15 1448  HGBA1C 5.9*   CBG:  Recent Labs Lab 11/11/15 2121 11/12/15 0751 11/12/15 1235 11/12/15 1703 11/12/15 2138  GLUCAP 163* 117* 132* 131* 135*   Lipid Profile: No results for input(s): CHOL, HDL, LDLCALC, TRIG, CHOLHDL, LDLDIRECT in the last 72 hours. Thyroid Function Tests: No results for input(s): TSH, T4TOTAL, FREET4, T3FREE, THYROIDAB in the last 72 hours. Anemia Panel: No results for input(s): VITAMINB12, FOLATE, FERRITIN, TIBC, IRON, RETICCTPCT in the last 72 hours. Urine analysis:    Component Value Date/Time   COLORURINE YELLOW 11/11/2015 1011   APPEARANCEUR TURBID* 11/11/2015 1011   LABSPEC 1.017 11/11/2015 1011   PHURINE 7.0 11/11/2015 1011   GLUCOSEU NEGATIVE 11/11/2015 1011   HGBUR LARGE* 11/11/2015 1011   BILIRUBINUR NEGATIVE 11/11/2015 1011   KETONESUR NEGATIVE 11/11/2015 1011   PROTEINUR 30* 11/11/2015 1011   UROBILINOGEN 0.2 12/20/2014 1851   NITRITE POSITIVE* 11/11/2015 1011   LEUKOCYTESUR LARGE* 11/11/2015 1011   Sepsis Labs: @LABRCNTIP (procalcitonin:4,lacticidven:4)  ) Recent Results (from the past 240 hour(s))  Blood Culture (routine x 2)     Status: None (Preliminary result)   Collection Time: 11/11/15  9:00 AM  Result Value Ref Range Status   Specimen Description BLOOD RIGHT HAND  Final   Special Requests BOTTLES DRAWN AEROBIC AND ANAEROBIC 5CC  Final   Culture  Setup Time   Final    GRAM  NEGATIVE RODS GRAM POSITIVE COCCI AEROBIC BOTTLE ONLY CRITICAL RESULT CALLED TO, READ BACK BY AND VERIFIED WITH: CLamont Snowball D AT 0916 ON [id number] BY SKY    Culture TOO YOUNG TO READ  Final   Report Status PENDING  Incomplete  Blood Culture ID Panel (Reflexed)     Status: Abnormal   Collection Time: 11/11/15  9:00 AM  Result Value Ref Range Status   Enterococcus species NOT DETECTED NOT DETECTED Final   Vancomycin resistance NOT DETECTED NOT DETECTED Final   Listeria monocytogenes NOT DETECTED NOT DETECTED Final   Staphylococcus species DETECTED (A) NOT DETECTED Final    Comment: CRITICAL RESULT  CALLED TO, READ BACK BY AND VERIFIED WITH: C. STEWART, PHARM D AT 0916 ON [id number] BY S. YARBROUGH    Staphylococcus aureus NOT DETECTED NOT DETECTED Final   Methicillin resistance DETECTED (A) NOT DETECTED Final    Comment: CRITICAL RESULT CALLED TO, READ BACK BY AND VERIFIED WITH: C. Nicole Kindred, PHARM D AT 0916 ON [id number] BY S. YARBROUGH    Streptococcus species NOT DETECTED NOT DETECTED Final   Streptococcus agalactiae NOT DETECTED NOT DETECTED Final   Streptococcus pneumoniae NOT DETECTED NOT DETECTED Final   Streptococcus pyogenes NOT DETECTED NOT DETECTED Final   Acinetobacter baumannii NOT DETECTED NOT DETECTED Final   Enterobacteriaceae species NOT DETECTED NOT DETECTED Final   Enterobacter cloacae complex NOT DETECTED NOT DETECTED Final   Escherichia coli NOT DETECTED NOT DETECTED Final   Klebsiella oxytoca NOT DETECTED NOT DETECTED Final   Klebsiella pneumoniae NOT DETECTED NOT DETECTED Final   Proteus species NOT DETECTED NOT DETECTED Final   Serratia marcescens NOT DETECTED NOT DETECTED Final   Carbapenem resistance NOT DETECTED NOT DETECTED Final   Haemophilus influenzae NOT DETECTED NOT DETECTED Final   Neisseria meningitidis NOT DETECTED NOT DETECTED Final   Pseudomonas aeruginosa NOT DETECTED NOT DETECTED Final   Candida albicans NOT DETECTED NOT DETECTED Final   Candida  glabrata NOT DETECTED NOT DETECTED Final   Candida krusei NOT DETECTED NOT DETECTED Final   Candida parapsilosis NOT DETECTED NOT DETECTED Final   Candida tropicalis NOT DETECTED NOT DETECTED Final  Blood Culture (routine x 2)     Status: None (Preliminary result)   Collection Time: 11/11/15  9:10 AM  Result Value Ref Range Status   Specimen Description BLOOD RIGHT ANTECUBITAL  Final   Special Requests   Final    BOTTLES DRAWN AEROBIC AND ANAEROBIC 10CC AER 5CC ANA   Culture NO GROWTH 1 DAY  Final   Report Status PENDING  Incomplete  Urine culture     Status: Abnormal   Collection Time: 11/11/15 10:11 AM  Result Value Ref Range Status   Specimen Description URINE, RANDOM  Final   Special Requests NONE  Final   Culture MULTIPLE SPECIES PRESENT, SUGGEST RECOLLECTION (A)  Final   Report Status 11/12/2015 FINAL  Final  MRSA PCR Screening     Status: None   Collection Time: 11/11/15  4:21 PM  Result Value Ref Range Status   MRSA by PCR NEGATIVE NEGATIVE Final    Comment:        The GeneXpert MRSA Assay (FDA approved for NASAL specimens only), is one component of a comprehensive MRSA colonization surveillance program. It is not intended to diagnose MRSA infection nor to guide or monitor treatment for MRSA infections.          Radiology Studies: Dg Abd 1 View  11/11/2015  CLINICAL DATA:  Abdominal pain.  Sepsis. EXAM: ABDOMEN - 1 VIEW COMPARISON:  Renal ultrasound 07/21/2015. CT abdomen and pelvis 12/15/2014. FINDINGS: Two adjacent oblong calcifications in the left upper abdomen measure 2-2.5 cm each and are consistent with known left renal calculi. There is a moderate amount of stool in the colon. Gas is present in nondilated loops of small bowel without evidence of obstruction. No gross intraperitoneal free air on this supine examination. Likely phleboliths in the pelvis. No acute osseous abnormality identified. IMPRESSION: 1. Nonobstructed bowel gas pattern. 2. Left renal  calculi. Electronically Signed   By: Logan Bores M.D.   On: 11/11/2015 13:11   Dg Chest [facility] 1 [address]  11/11/2015  CLINICAL DATA:  Sepsis EXAM: PORTABLE CHEST 1 VIEW COMPARISON:  08/31/2015 FINDINGS: Low lung volumes are present, causing crowding of the pulmonary vasculature. Borderline enlargement of the cardiopericardial silhouette accounting for the low lung volumes and AP projection. Stable mild blunting of the left lateral costophrenic angle. Stable mild left retro diaphragmatic density compared to the right. IMPRESSION: 1. Retro diaphragmatic density on the left with mild blunting of the left lateral costophrenic angle, potentially from left pleural effusion and/or left lower lobe atelectasis/pneumonia. This appears stable. Electronically Signed   By: Van Clines M.D.   On: 11/11/2015 13:12        Scheduled Meds: . baclofen  10 mg Oral TID  . darifenacin  7.5 mg Oral Daily  . feeding supplement (ENSURE ENLIVE)  237 mL Oral BID BM  . feeding supplement (PRO-STAT SUGAR FREE 64)  30 mL Oral BID  . gabapentin  100 mg Oral QHS  . insulin aspart  0-5 Units Subcutaneous QHS  . insulin aspart  0-9 Units Subcutaneous TID WC  . piperacillin-tazobactam (ZOSYN)  IV  3.375 g Intravenous Q8H  . polyethylene glycol  17 g Oral BID  . potassium chloride  40 mEq Oral BID  . pravastatin  20 mg Oral q1800  . rivaroxaban  20 mg Oral Q supper  . sodium chloride flush  3 mL Intravenous Q12H  . vancomycin  750 mg Intravenous Q12H   Continuous Infusions: . sodium chloride 100 mL/hr at 11/12/15 2355     LOS: 2 days    Time spent: 40 minutes     Phaedra Colgate, Geraldo Docker, MD [facility] Pager [phone]   If 7PM-7AM, please contact night-coverage [email or web address] Password [facility] 11/13/2015, 8:31 AM

## 2015-11-13 NOTE — Progress Notes (Signed)
Pt LTC facility no longer has pt bed available- unsure of pt DC date- CSW started bed search for alternate LTC facility in case pt is ready to DC before Tuesday when [facility] will have another bed available.  CSW attempted to call family to discuss  CSW will continue to follow  Domenica Reamer, [place] Social Worker [phone]

## 2015-11-13 NOTE — Progress Notes (Signed)
Echocardiogram 2D Echocardiogram has been performed.  [name] 11/13/2015, 4:31 PM

## 2015-11-13 NOTE — Progress Notes (Signed)
Nutrition Consult/Follow Up  DOCUMENTATION CODES:   Non-severe (moderate) malnutrition in context of chronic illness  INTERVENTION:     Continue Glucerna Shake po TID, each supplement provides 220 kcal and 10 grams of protein   Continue Prostat liquid protein po 30 ml BID with meals, each supplement provides 100 kcal, 15 grams protein  NUTRITION DIAGNOSIS:   Malnutrition related to chronic illness as evidenced by moderate depletion of body fat, moderate depletions of muscle mass, ongoing  GOAL:   Patient will meet greater than or equal to 90% of their needs, progressing   MONITOR:   PO intake, Supplement acceptance, Labs, Weight trends, Skin, I & O's  ASSESSMENT:   55 yo Female with PMH significant of MS, chronic decubitus ulcers, C KD stage III, protein calorie malnutrition, bedbound, chronic UTIs with indwelling Foley catheter presenting from the nursing home after patient was noted to be somnolent and confused.   Initial nutrition assessment completed 6/1. Identified with moderate protein calorie malnutrition. PO intake variable at 50-75% per flowsheet records. Receiving Glucerna Shake TID.  Diet Order:  Diet Heart Room service appropriate?: Yes; Fluid consistency:: Thin  Skin:  Wound (see comment) (several superficial dime sized decubitus ulcers on bilat buttocks)  Last BM:  6/1  Height:   Ht Readings from Last 1 Encounters:  10/29/15 5\' 6"  (1.676 m)    Weight:   Wt Readings from Last 1 Encounters:  11/13/15 152 lb 1.9 oz (69 kg)    Ideal Body Weight:  59 kg  BMI:  Body mass index is 24.56 kg/(m^2).  Estimated Nutritional Needs:   Kcal:  1700-1900  Protein:  90-100 gm  Fluid:  1.7-1.9 L  EDUCATION NEEDS:   No education needs identified at this time  Arthur Holms, RD, LDN Pager #: [phone] After-Hours Pager #: [phone]

## 2015-11-13 NOTE — Care Management Important Message (Signed)
Important Message  Patient Details  Name: [name] MRN: [id number] Date of Birth: 09/12/1960   Medicare Important Message Given:  Yes    Loann Quill 11/13/2015, 9:00 AM

## 2015-11-14 LAB — COMPREHENSIVE METABOLIC PANEL
ALT: 23 U/L (ref 14–54)
AST: 19 U/L (ref 15–41)
Albumin: 1.7 g/dL — ABNORMAL LOW (ref 3.5–5.0)
Alkaline Phosphatase: 70 U/L (ref 38–126)
Anion gap: 4 — ABNORMAL LOW (ref 5–15)
BILIRUBIN TOTAL: 0.3 mg/dL (ref 0.3–1.2)
BUN: 15 mg/dL (ref 6–20)
CHLORIDE: 115 mmol/L — AB (ref 101–111)
CO2: 25 mmol/L (ref 22–32)
Calcium: 9.5 mg/dL (ref 8.9–10.3)
Creatinine, Ser: 0.67 mg/dL (ref 0.44–1.00)
Glucose, Bld: 151 mg/dL — ABNORMAL HIGH (ref 65–99)
POTASSIUM: 3.6 mmol/L (ref 3.5–5.1)
Sodium: 144 mmol/L (ref 135–145)
TOTAL PROTEIN: 5.8 g/dL — AB (ref 6.5–8.1)

## 2015-11-14 LAB — CBC WITH DIFFERENTIAL/PLATELET
BASOS ABS: 0 10*3/uL (ref 0.0–0.1)
Basophils Relative: 0 %
EOS PCT: 3 %
Eosinophils Absolute: 0.4 10*3/uL (ref 0.0–0.7)
HCT: 20 % — ABNORMAL LOW (ref 36.0–46.0)
Hemoglobin: 6.2 g/dL — CL (ref 12.0–15.0)
LYMPHS PCT: 20 %
Lymphs Abs: 2.7 10*3/uL (ref 0.7–4.0)
MCH: 26.5 pg (ref 26.0–34.0)
MCHC: 31 g/dL (ref 30.0–36.0)
MCV: 85.5 fL (ref 78.0–100.0)
Monocytes Absolute: 0.6 10*3/uL (ref 0.1–1.0)
Monocytes Relative: 4 %
NEUTROS ABS: 9.6 10*3/uL — AB (ref 1.7–7.7)
Neutrophils Relative %: 73 %
PLATELETS: 355 10*3/uL (ref 150–400)
RBC: 2.34 MIL/uL — AB (ref 3.87–5.11)
RDW: 18.7 % — ABNORMAL HIGH (ref 11.5–15.5)
WBC: 13.2 10*3/uL — AB (ref 4.0–10.5)

## 2015-11-14 LAB — GLUCOSE, CAPILLARY
GLUCOSE-CAPILLARY: 135 mg/dL — AB (ref 65–99)
GLUCOSE-CAPILLARY: 134 mg/dL — AB (ref 65–99)
Glucose-Capillary: 200 mg/dL — ABNORMAL HIGH (ref 65–99)
Glucose-Capillary: 153 mg/dL — ABNORMAL HIGH (ref 65–99)

## 2015-11-14 LAB — URINE CULTURE

## 2015-11-14 LAB — HEMOGLOBIN AND HEMATOCRIT, BLOOD
HCT: 20.3 % — ABNORMAL LOW (ref 36.0–46.0)
HEMOGLOBIN: 6.3 g/dL — AB (ref 12.0–15.0)

## 2015-11-14 LAB — MAGNESIUM: MAGNESIUM: 1.7 mg/dL (ref 1.7–2.4)

## 2015-11-14 MED ORDER — MAGNESIUM CITRATE PO SOLN
0.5000 | Freq: Once | ORAL | Status: AC
  Administered 2015-11-14: 0.5 via ORAL
  Filled 2015-11-14: qty 296

## 2015-11-14 MED ORDER — SODIUM CHLORIDE 0.9 % IV SOLN
Freq: Once | INTRAVENOUS | Status: AC
  Administered 2015-11-14: 23:00:00 via INTRAVENOUS

## 2015-11-14 NOTE — Progress Notes (Signed)
CRITICAL VALUE ALERT  Critical value received:  Hgb 6.2 (from 8.8 on 11/13/15)  Date of notification:  11/14/15  Time of notification:  [id number]  Critical value read back: yes  Nurse who received alert:  Candiss Norse RN  MD notified (1st page):  Fredirick Maudlin NP  Time of first page:  [phone]  MD notified (2nd page):  Time of second page:  Responding MD:  Fredirick Maudlin NP  Time MD responded: [phone]  Hold til Day MD comes.

## 2015-11-14 NOTE — Progress Notes (Signed)
Pharmacy Antibiotic Note  [name] is a 55 y.o. female admitted on 11/11/2015 with sepsis with likely UTI.  Pharmacy managing vancomycin/Zosyn dosing.  Day #4 of abx for Providencia bacteremia. Afebrile, WBC down to 13.2 (peaked at 18.4)   Plan: Continue Zosyn 3.375 gm IV q8h (4 hour infusion) Continue vancomycin 750mg  IV Q12 Monitor clinical picture, renal function, VT prn F/U C&S, abx deescalation / LOT  Text paged MD about de-escalating abx to Unasyn or ceftriaxone  Height: 5\' 6"  (167.6 cm) Weight: 152 lb 1.9 oz (69 kg) IBW/kg (Calculated) : 59.3  Temp (24hrs), Avg:99.5 F (37.5 C), Min:98.9 F (37.2 C), Max:100.6 F (38.1 C)   Recent Labs Lab 11/11/15 0929 11/11/15 1444 11/11/15 1446 11/11/15 1447 11/11/15 1627 11/12/15 0400 11/12/15 0809 11/13/15 0544 11/13/15 2115 11/14/15 0325  WBC 26.8*  --   --   --   --  16.3*  --  18.4*  --  13.2*  CREATININE 1.39*  --   --  1.27*  --  0.82  --  0.62 0.71 0.67  LATICACIDVEN  --  5.42* 4.8*  --  5.5* 2.5* 1.3  --   --   --     Estimated Creatinine Clearance: 72.6 mL/min (by C-G formula based on Cr of 0.67).    No Known Allergies  Antimicrobials this admission: 5/31 vancomycin >>  5/31 Zosyn >>   Dose adjustments this admission: n/a  Microbiology results: 5/31 blood cx > 1/2 CONS and Providencia (R to ampicillin / cefazolin / cipro / gent) 5/31 urine cx > multiple species present 6/2 urine cx > sent MRSA PCR negative  Thank you for allowing pharmacy to be a part of this patient's care.  Elenor Quinones, PharmD, BCPS Clinical Pharmacist Pager [phone] 11/14/2015 9:30 AM

## 2015-11-14 NOTE — Progress Notes (Signed)
PROGRESS NOTE    [name]  [id number] DOB: 01/02/1961 DOA: 11/11/2015 PCP: Gildardo Cranker, DO   Brief Narrative:  55 y.o. BF PMHx  MS, Chronic Decubitus ulcers, C KD stage III, Protein Calorie Malnutrition, Bedbound, Chronic UTIs with indwelling Foley catheter, DM type II with complication, DVT   presenting from the nursing home after patient was noted to be somnolent and confused.   Level V caveat applies as patient is unable to provide reliable history. Patient is alert and oriented to person and place only. Answers are somewhat coherent but at other times cannot is fairly gibberish. History provided by EDP, nursing home report, and family members via phone conversation. At 7:15 this morning patient was noted by nursing home staff to be confused and to have a blood pressure of 71/47 with pulse of 71, temperature 99.3 and respirations 16 with 95% O2 sats. Per report patient had vomited a couple of times a day before but was otherwise in her normal state of health. Patient was treated with Phenergan after the emesis with resolution. Currently patient has no complaints. Prior to this event patient was in her normal state of health. EMS was contacted, peripheral IV and fluids were started, and patient was transported to [facility] ED. Per report patient is normally completely alert and oriented.   Assessment & Plan:   Active Problems:   Multiple sclerosis diagnosed 2002-not on Therapy any longer   Acute encephalopathy   Diabetes mellitus type 2, controlled ([place])   UTI (lower urinary tract infection)   Sepsis (HCC)   Lower extremity edema   Chronic indwelling Foley catheter   AKI (acute kidney injury) ([place])   Neurogenic bladder   Protein calorie malnutrition (HCC)   Physical deconditioning   HLD (hyperlipidemia)   Sepsis, unspecified organism ([place])   Protein-calorie malnutrition ([place])   Decubitus ulcer   Controlled diabetes mellitus type 2 with complications ([place])   Sepsis  unspecified organism:  -Most likely from chronic indwelling catheter - Continue vancomycin and Zosyn - CXR; see results below,  -KUB (also complaining of Abd pain); moderate stool. See constipation  Acute renal failure (baseline Cr~ 0.9.) Lab Results  Component Value Date   CREATININE 0.67 11/14/2015   CREATININE 0.71 11/13/2015   CREATININE 0.62 11/13/2015  -Improving with hydration; normal saline 125 ml/hr   Neurogenic bladder with Chronic indwelling Foley catheter.  -This is been the source of the patient's last 2 admissions. Foley changed at time of admission (per patient~1 week ago). - Consider urology consult - Discharge patient on chronic prophylactic UTI antibiotics - Continue Vesicare and Foley catheter  Multiple sclerosis -Not on medication  Protein calorie malnutrition/physical deconditioning:  -Multifactorial to include chronic MS (No MS medications). Bedbound. -Nutrition consult - Pro stat feeding supplement - Continue baclofen for contractures  Several superficial dime sized decubitus ulcers on bilat buttocks." -Per ED note  - PT/OT; Recommends SNF - Air overlay - wound care  DM type II controlled with complication:  -Q000111Q hemoglobin A1c= 5.9 - Hold metformin - Sensitive SSI  DVT: - continue xarelto  Dependent edema: -Patient on Lasix without diagnoses of CHF. No indication for this in patient's history of lower extremity edema - Echocardiogram; EF normal - Hold Lasix; no heart failure.  HLD: - Continue statin]  Hypokalemia -Resolved  Hypophosphatemia   Constipation -MiraLAX BID -Repeat 0.5 bottle magnesium citrate + soapsuds enema  Acute blood loss anemia? -Repeat H/H; 6.3 -6/3 Transfuse 2 units PRBC -Occult blood card -Transfuse for hemoglobin<8  Goals of care -Talked with patient extensively about the difference between DO NOT RESUSCITATE and full code, and after answering patient's questions she decided to be made DO NOT  RESUSCITATE   DVT prophylaxis: Eliquis Code Status: DO NOT RESUSCITATE Family Communication: None available Disposition Plan: Resolution sepsis   Consultants:  None  Procedures/Significant Events:  5/31 KUB;moderate amount of stool in the colon. Gas is present in nondilated loops of small bowel without evidence of obstruction. 5/31 PCXR;-Retro diaphragmatic density on the left with mild blunting of the left lateral costophrenic angle, potentially from left pleural effusion and/or left lower lobe atelectasis/pneumonia. Stable. 6/2 echocardiogram;LVEF=  60%- 65%.    Cultures 5/31 blood right hand positive PROVIDENCIA STUARTII; GPC; reincubated for better growth 5/31 blood right AC NGTD 5/31 urine positive multiple species 5/31 MRSA by PCR negative 6/2 urine  positive multiple species   Antimicrobials: 5/31 Zosyn>> 5/31 vancomycin>>   Devices    LINES / TUBES:  -Chronic Foley    Continuous Infusions: . sodium chloride 1,000 mL (11/14/15 1608)     Subjective: 6/3  MAXIMUM TEMPERATURE last 24 hours 38.1C, States still has not had a BM, Positive abdominal discomfort    Objective: Filed Vitals:   11/14/15 0600 11/14/15 0753 11/14/15 1128 11/14/15 1500  BP: 136/69 118/63 101/59 123/68  Pulse: 99 99 90 96  Temp:  99.2 F (37.3 C) 98.4 F (36.9 C) 98.4 F (36.9 C)  TempSrc:  Oral Oral Oral  Resp: 23 26 19 24   Height:      Weight:      SpO2: 95% 98% 97% 91%    Intake/Output Summary (Last 24 hours) at 11/14/15 1639 Last data filed at 11/14/15 1526  Gross per 24 hour  Intake   2790 ml  Output   2600 ml  Net    190 ml   Filed Weights   11/11/15 1011 11/13/15 0500  Weight: 59.875 kg (132 lb) 69 kg (152 lb 1.9 oz)    Examination:  General: A/O 4, NAD, No acute respiratory distress Eyes: negative scleral hemorrhage, negative anisocoria, negative icterus ENT: Negative Runny nose, negative gingival bleeding, Neck:  Negative scars, masses, torticollis,  lymphadenopathy, JVD Lungs: Clear to auscultation bilaterally without wheezes or crackles Cardiovascular: Tachycardic, Regular  rhythm without murmur gallop or rub normal S1 and S2 Abdomen: Positive suprapubic abdominal pain to palpation, nondistended, positive soft, bowel sounds, no rebound, no ascites, no appreciable mass, positive bilateral CVA tenderness Rt>>Lt Extremities: No significant cyanosis, clubbing, or edema bilateral lower extremities Skin: Negative rashes, lesions, ulcers Psychiatric:  Negative depression, negative anxiety, negative fatigue, negative mania  Central nervous system:  Cranial nerves II through XII intact, tongue/uvula midline, all extremities muscle strength 5/5, sensation intact throughout, negative dysarthria, negative expressive aphasia, negative receptive aphasia.  .     Data Reviewed: Care during the described time interval was provided by me .  I have reviewed this patient's available data, including medical history, events of note, physical examination, and all test results as part of my evaluation. I have personally reviewed and interpreted all radiology studies.  CBC:  Recent Labs Lab 11/11/15 0929 11/12/15 0400 11/13/15 0544 11/14/15 0325  WBC 26.8* 16.3* 18.4* 13.2*  NEUTROABS 24.4*  --  15.4* 9.6*  HGB 9.4* 7.9* 8.8* 6.2*  HCT 29.7* 25.5* 28.5* 20.0*  MCV 86.8 87.9 87.4 85.5  PLT 478* 382 381 [id number]   Basic Metabolic Panel:  Recent Labs Lab 11/11/15 0929 11/11/15 1447 11/12/15 0400 11/12/15 2334 11/13/15 0544  11/13/15 2115 11/14/15 0325  NA 141  --  143  --  142 142 144  K 4.2  --  3.0*  --  3.4* 3.4* 3.6  CL 111  --  115*  --  115* 115* 115*  CO2 20*  --  20*  --  20* 22 25  GLUCOSE 166*  --  117*  --  123* 211* 151*  BUN 53*  --  32*  --  16 14 15   CREATININE 1.39* 1.27* 0.82  --  0.62 0.71 0.67  CALCIUM 10.4*  --  9.3  --  9.8 9.5 9.5  MG  --  1.7  --   --  1.7  --  1.7  PHOS  --  4.3  --  1.8*  --   --   --     GFR: Estimated Creatinine Clearance: 72.6 mL/min (by C-G formula based on Cr of 0.67). Liver Function Tests:  Recent Labs Lab 11/11/15 0929 11/12/15 0400 11/12/15 2334 11/13/15 0544 11/14/15 0325  AST 14* 17  --  19 19  ALT 11* 13*  --  18 23  ALKPHOS 45 54  --  77 70  BILITOT 0.5 0.5  --  0.5 0.3  PROT 7.3 6.3*  --  6.9 5.8*  ALBUMIN 2.9* 2.3* 2.4* 2.3* 1.7*   No results for input(s): LIPASE, AMYLASE in the last 168 hours. No results for input(s): AMMONIA in the last 168 hours. Coagulation Profile:  Recent Labs Lab 11/11/15 1447  INR 1.86*   Cardiac Enzymes:  Recent Labs Lab 11/11/15 1627  TROPONINI <0.03   BNP (last 3 results) No results for input(s): PROBNP in the last 8760 hours. HbA1C: No results for input(s): HGBA1C in the last 72 hours. CBG:  Recent Labs Lab 11/13/15 1201 11/13/15 1631 11/13/15 2004 11/14/15 0846 11/14/15 1231  GLUCAP 226* 266* 216* 135* 134*   Lipid Profile: No results for input(s): CHOL, HDL, LDLCALC, TRIG, CHOLHDL, LDLDIRECT in the last 72 hours. Thyroid Function Tests: No results for input(s): TSH, T4TOTAL, FREET4, T3FREE, THYROIDAB in the last 72 hours. Anemia Panel: No results for input(s): VITAMINB12, FOLATE, FERRITIN, TIBC, IRON, RETICCTPCT in the last 72 hours. Urine analysis:    Component Value Date/Time   COLORURINE YELLOW 11/11/2015 1011   APPEARANCEUR TURBID* 11/11/2015 1011   LABSPEC 1.017 11/11/2015 1011   PHURINE 7.0 11/11/2015 1011   GLUCOSEU NEGATIVE 11/11/2015 1011   HGBUR LARGE* 11/11/2015 1011   BILIRUBINUR NEGATIVE 11/11/2015 1011   KETONESUR NEGATIVE 11/11/2015 1011   PROTEINUR 30* 11/11/2015 1011   UROBILINOGEN 0.2 12/20/2014 1851   NITRITE POSITIVE* 11/11/2015 1011   LEUKOCYTESUR LARGE* 11/11/2015 1011   Sepsis Labs: @LABRCNTIP (procalcitonin:4,lacticidven:4)  ) Recent Results (from the past 240 hour(s))  Blood Culture (routine x 2)     Status: Abnormal (Preliminary result)   Collection  Time: 11/11/15  9:00 AM  Result Value Ref Range Status   Specimen Description BLOOD RIGHT HAND  Final   Special Requests BOTTLES DRAWN AEROBIC AND ANAEROBIC 5CC  Final   Culture  Setup Time   Final    GRAM NEGATIVE RODS GRAM POSITIVE COCCI AEROBIC BOTTLE ONLY CRITICAL RESULT CALLED TO, READ BACK BY AND VERIFIED WITH: C. STEWART, PHARM D AT 0916 ON [id number] BY SKY    Culture (A)  Final    PROVIDENCIA STUARTII GRAM POSITIVE COCCI CULTURE REINCUBATED FOR BETTER GROWTH    Report Status PENDING  Incomplete   Organism ID, Bacteria PROVIDENCIA STUARTII  Final  Susceptibility   Providencia stuartii - MIC*    AMPICILLIN 16 RESISTANT Resistant     CEFAZOLIN >=64 RESISTANT Resistant     CEFEPIME <=1 SENSITIVE Sensitive     CEFTAZIDIME <=1 SENSITIVE Sensitive     CEFTRIAXONE <=1 SENSITIVE Sensitive     CIPROFLOXACIN >=4 RESISTANT Resistant     GENTAMICIN 8 RESISTANT Resistant     IMIPENEM 1 SENSITIVE Sensitive     TRIMETH/SULFA <=20 SENSITIVE Sensitive     AMPICILLIN/SULBACTAM 4 SENSITIVE Sensitive     PIP/TAZO <=4 SENSITIVE Sensitive     * PROVIDENCIA STUARTII  Blood Culture ID Panel (Reflexed)     Status: Abnormal   Collection Time: 11/11/15  9:00 AM  Result Value Ref Range Status   Enterococcus species NOT DETECTED NOT DETECTED Final   Vancomycin resistance NOT DETECTED NOT DETECTED Final   Listeria monocytogenes NOT DETECTED NOT DETECTED Final   Staphylococcus species DETECTED (A) NOT DETECTED Final    Comment: CRITICAL RESULT CALLED TO, READ BACK BY AND VERIFIED WITH: C. STEWART, PHARM D AT 0916 ON [id number] BY S. YARBROUGH    Staphylococcus aureus NOT DETECTED NOT DETECTED Final   Methicillin resistance DETECTED (A) NOT DETECTED Final    Comment: CRITICAL RESULT CALLED TO, READ BACK BY AND VERIFIED WITH: C. STEWART, PHARM D AT 0916 ON [id number] BY S. YARBROUGH    Streptococcus species NOT DETECTED NOT DETECTED Final   Streptococcus agalactiae NOT DETECTED NOT DETECTED Final    Streptococcus pneumoniae NOT DETECTED NOT DETECTED Final   Streptococcus pyogenes NOT DETECTED NOT DETECTED Final   Acinetobacter baumannii NOT DETECTED NOT DETECTED Final   Enterobacteriaceae species NOT DETECTED NOT DETECTED Final   Enterobacter cloacae complex NOT DETECTED NOT DETECTED Final   Escherichia coli NOT DETECTED NOT DETECTED Final   Klebsiella oxytoca NOT DETECTED NOT DETECTED Final   Klebsiella pneumoniae NOT DETECTED NOT DETECTED Final   Proteus species NOT DETECTED NOT DETECTED Final   Serratia marcescens NOT DETECTED NOT DETECTED Final   Carbapenem resistance NOT DETECTED NOT DETECTED Final   Haemophilus influenzae NOT DETECTED NOT DETECTED Final   Neisseria meningitidis NOT DETECTED NOT DETECTED Final   Pseudomonas aeruginosa NOT DETECTED NOT DETECTED Final   Candida albicans NOT DETECTED NOT DETECTED Final   Candida glabrata NOT DETECTED NOT DETECTED Final   Candida krusei NOT DETECTED NOT DETECTED Final   Candida parapsilosis NOT DETECTED NOT DETECTED Final   Candida tropicalis NOT DETECTED NOT DETECTED Final  Blood Culture (routine x 2)     Status: None (Preliminary result)   Collection Time: 11/11/15  9:10 AM  Result Value Ref Range Status   Specimen Description BLOOD RIGHT ANTECUBITAL  Final   Special Requests   Final    BOTTLES DRAWN AEROBIC AND ANAEROBIC 10CC AER 5CC ANA   Culture NO GROWTH 3 DAYS  Final   Report Status PENDING  Incomplete  Urine culture     Status: Abnormal   Collection Time: 11/11/15 10:11 AM  Result Value Ref Range Status   Specimen Description URINE, RANDOM  Final   Special Requests NONE  Final   Culture MULTIPLE SPECIES PRESENT, SUGGEST RECOLLECTION (A)  Final   Report Status 11/12/2015 FINAL  Final  MRSA PCR Screening     Status: None   Collection Time: 11/11/15  4:21 PM  Result Value Ref Range Status   MRSA by PCR NEGATIVE NEGATIVE Final    Comment:        The GeneXpert MRSA Assay (FDA  approved for NASAL specimens only), is  one component of a comprehensive MRSA colonization surveillance program. It is not intended to diagnose MRSA infection nor to guide or monitor treatment for MRSA infections.   Culture, Urine     Status: Abnormal   Collection Time: 11/13/15 12:50 PM  Result Value Ref Range Status   Specimen Description URINE, CATHETERIZED  Final   Special Requests NONE  Final   Culture MULTIPLE SPECIES PRESENT, SUGGEST RECOLLECTION (A)  Final   Report Status 11/14/2015 FINAL  Final         Radiology Studies: No results found.      Scheduled Meds: . baclofen  10 mg Oral TID  . darifenacin  7.5 mg Oral Daily  . feeding supplement (ENSURE ENLIVE)  237 mL Oral BID BM  . feeding supplement (PRO-STAT SUGAR FREE 64)  30 mL Oral BID  . gabapentin  100 mg Oral QHS  . insulin aspart  0-5 Units Subcutaneous QHS  . insulin aspart  0-9 Units Subcutaneous TID WC  . piperacillin-tazobactam (ZOSYN)  IV  3.375 g Intravenous Q8H  . polyethylene glycol  17 g Oral BID  . pravastatin  20 mg Oral q1800  . rivaroxaban  20 mg Oral Q supper  . sodium chloride flush  3 mL Intravenous Q12H  . vancomycin  750 mg Intravenous Q12H   Continuous Infusions: . sodium chloride 1,000 mL (11/14/15 1608)     LOS: 3 days    Time spent: 40 minutes     Stellah Donovan, Geraldo Docker, MD [facility] Pager [phone]   If 7PM-7AM, please contact night-coverage [email or web address] Password [facility] 11/14/2015, 4:39 PM

## 2015-11-14 NOTE — Plan of Care (Signed)
Problem: Education: Goal: Knowledge of [place] [facility] Education information/materials will improve Outcome: Progressing Patient asked to give mag citrate a try before soap suds enema may do in the morning.

## 2015-11-14 NOTE — Plan of Care (Signed)
Problem: Education: Goal: Knowledge of [place] [facility] Education information/materials will improve Outcome: Progressing Discussed the blood consent and risk with second nurse present

## 2015-11-15 DIAGNOSIS — N10 Acute tubulo-interstitial nephritis: Secondary | ICD-10-CM | POA: Diagnosis present

## 2015-11-15 DIAGNOSIS — N12 Tubulo-interstitial nephritis, not specified as acute or chronic: Secondary | ICD-10-CM | POA: Diagnosis present

## 2015-11-15 LAB — COMPREHENSIVE METABOLIC PANEL
ALBUMIN: 1.7 g/dL — AB (ref 3.5–5.0)
ALK PHOS: 82 U/L (ref 38–126)
ALT: 25 U/L (ref 14–54)
ANION GAP: 8 (ref 5–15)
AST: 18 U/L (ref 15–41)
BUN: 16 mg/dL (ref 6–20)
CALCIUM: 9.6 mg/dL (ref 8.9–10.3)
CHLORIDE: 110 mmol/L (ref 101–111)
CO2: 25 mmol/L (ref 22–32)
Creatinine, Ser: 0.61 mg/dL (ref 0.44–1.00)
GFR calc Af Amer: >60 mL/min (ref >60)
GFR calc non Af Amer: >60 mL/min (ref >60)
GLUCOSE: 121 mg/dL — AB (ref 65–99)
Potassium: 3.3 mmol/L — ABNORMAL LOW (ref 3.5–5.1)
SODIUM: 143 mmol/L (ref 135–145)
Total Bilirubin: 1 mg/dL (ref 0.3–1.2)
Total Protein: 5.6 g/dL — ABNORMAL LOW (ref 6.5–8.1)

## 2015-11-15 LAB — MAGNESIUM: MAGNESIUM: 1.9 mg/dL (ref 1.7–2.4)

## 2015-11-15 LAB — CBC WITH DIFFERENTIAL/PLATELET
BASOS ABS: 0.1 10*3/uL (ref 0.0–0.1)
Basophils Relative: 1 %
EOS ABS: 0.4 10*3/uL (ref 0.0–0.7)
Eosinophils Relative: 3 %
HCT: 27.2 % — ABNORMAL LOW (ref 36.0–46.0)
Hemoglobin: 8.7 g/dL — ABNORMAL LOW (ref 12.0–15.0)
LYMPHS PCT: 25 %
Lymphs Abs: 3 10*3/uL (ref 0.7–4.0)
MCH: 26.4 pg (ref 26.0–34.0)
MCHC: 32 g/dL (ref 30.0–36.0)
MCV: 82.4 fL (ref 78.0–100.0)
MONO ABS: 1 10*3/uL (ref 0.1–1.0)
Monocytes Relative: 8 %
NEUTROS PCT: 63 %
Neutro Abs: 7.4 10*3/uL (ref 1.7–7.7)
PLATELETS: 307 10*3/uL (ref 150–400)
RBC: 3.3 MIL/uL — AB (ref 3.87–5.11)
RDW: 18.5 % — ABNORMAL HIGH (ref 11.5–15.5)
WBC: 11.9 10*3/uL — ABNORMAL HIGH (ref 4.0–10.5)

## 2015-11-15 LAB — OCCULT BLOOD X 1 CARD TO LAB, STOOL: FECAL OCCULT BLD: NEGATIVE

## 2015-11-15 LAB — GLUCOSE, CAPILLARY
GLUCOSE-CAPILLARY: 216 mg/dL — AB (ref 65–99)
Glucose-Capillary: 128 mg/dL — ABNORMAL HIGH (ref 65–99)
Glucose-Capillary: 197 mg/dL — ABNORMAL HIGH (ref 65–99)
Glucose-Capillary: 170 mg/dL — ABNORMAL HIGH (ref 65–99)

## 2015-11-15 LAB — PREPARE RBC (CROSSMATCH)

## 2015-11-15 MED ORDER — SULFAMETHOXAZOLE-TRIMETHOPRIM 800-160 MG PO TABS
1.0000 | ORAL_TABLET | Freq: Two times a day (BID) | ORAL | Status: DC
  Administered 2015-11-15 – 2015-11-18 (×7): 1 via ORAL
  Filled 2015-11-15 (×8): qty 1

## 2015-11-15 MED ORDER — POTASSIUM CHLORIDE CRYS ER 20 MEQ PO TBCR
40.0000 meq | EXTENDED_RELEASE_TABLET | Freq: Once | ORAL | Status: AC
  Administered 2015-11-15: 40 meq via ORAL
  Filled 2015-11-15: qty 2

## 2015-11-15 NOTE — Plan of Care (Signed)
Problem: Fluid Volume: Goal: Hemodynamic stability will improve Outcome: Progressing Discussed importance of drinking supplemental shakes

## 2015-11-15 NOTE — Progress Notes (Signed)
PROGRESS NOTE    [name]  [id number] DOB: Mar 21, 1961 DOA: 11/11/2015 PCP: Gildardo Cranker, DO   Brief Narrative:  55 y.o. BF PMHx  MS, Chronic Decubitus ulcers, C KD stage III, Protein Calorie Malnutrition, Bedbound, Chronic UTIs with indwelling Foley catheter, DM type II with complication, DVT   presenting from the nursing home after patient was noted to be somnolent and confused.   Level V caveat applies as patient is unable to provide reliable history. Patient is alert and oriented to person and place only. Answers are somewhat coherent but at other times cannot is fairly gibberish. History provided by EDP, nursing home report, and family members via phone conversation. At 7:15 this morning patient was noted by nursing home staff to be confused and to have a blood pressure of 71/47 with pulse of 71, temperature 99.3 and respirations 16 with 95% O2 sats. Per report patient had vomited a couple of times a day before but was otherwise in her normal state of health. Patient was treated with Phenergan after the emesis with resolution. Currently patient has no complaints. Prior to this event patient was in her normal state of health. EMS was contacted, peripheral IV and fluids were started, and patient was transported to [facility] ED. Per report patient is normally completely alert and oriented.   Assessment & Plan:   Active Problems:   Multiple sclerosis diagnosed 2002-not on Therapy any longer   Acute encephalopathy   Diabetes mellitus type 2, controlled ([place])   UTI (lower urinary tract infection)   Sepsis (HCC)   Lower extremity edema   Chronic indwelling Foley catheter   AKI (acute kidney injury) ([place])   Neurogenic bladder   Protein calorie malnutrition (HCC)   Physical deconditioning   HLD (hyperlipidemia)   Sepsis, unspecified organism ([place])   Protein-calorie malnutrition ([place])   Decubitus ulcer   Controlled diabetes mellitus type 2 with complications ([place])   Acute  pyelonephritis   Sepsis unspecified organism/ Complicated Pyelonephritis:  -Most likely from chronic indwelling catheter -positive PROVIDENCIA STUARTII & GPC most likely coag negative staph -Deescalate ABx;  Stop vancomycin and Zosyn. Start Bactrim would complete 14 days anabiotic's     Acute renal failure (baseline Cr~ 0.9.) Lab Results  Component Value Date   CREATININE 0.61 11/15/2015   CREATININE 0.67 11/14/2015   CREATININE 0.71 11/13/2015  -Improving with hydration; normal saline 75 ml/hr -Resolved with hydration  Neurogenic bladder with Chronic indwelling Foley catheter.  -This is been the source of the patient's last 2 admissions. Foley changed at time of admission (per patient~1 week ago). - Discharge patient on chronic prophylactic UTI antibiotics - Continue Vesicare and Foley catheter -Ensure follow-up with Urologist as outpatient to monitor patient's progress; states has urologist which she sees.  Multiple sclerosis -Not on medication secondary to being scared of needles. Resulted in her becoming bedbound/mobility with electric chair. -Requests referral to a new neurologist; any medication that would improve quality of life?  Protein calorie malnutrition/physical deconditioning:  -Multifactorial to include chronic MS (No MS medications). Bedbound. -Nutrition consult - Pro stat feeding supplement; Ensure feeding supplement - Continue baclofen for contractures  Several superficial dime sized decubitus ulcers on bilat buttocks." -Per ED note  - PT/OT; Recommends SNF - Air overlay - wound care  DM type II controlled with complication:  -[id number] hemoglobin A1c= 5.9 - Hold metformin - Sensitive SSI  DVT: - continue xarelto  Dependent edema: - Echocardiogram; EF normal - Hold Lasix; no heart failure.  HLD: -  Continue statin]  Hypokalemia -K Dur 40 mEq  Hypophosphatemia   Constipation -KUB (also complaining of Abd pain); moderate stool.  -Resolved with   magnesium citrate + soapsuds enema -Continue MiraLAX BID  Acute blood loss anemia? -Repeat H/H; 6.3 -6/3 Transfuse 2 units PRBC -Occult blood card negative -Transfuse for hemoglobin<8    Goals of care -Talked with patient extensively about the difference between DO NOT RESUSCITATE and full code, and after answering patient's questions she decided to be made DO NOT RESUSCITATE   DVT prophylaxis: Eliquis Code Status: DO NOT RESUSCITATE Family Communication: None available Disposition Plan: Resolution sepsis   Consultants:  None  Procedures/Significant Events:  5/31 KUB;moderate amount of stool in the colon. Gas is present in nondilated loops of small bowel without evidence of obstruction. 5/31 PCXR;-Retro diaphragmatic density on the left with mild blunting of the left lateral costophrenic angle, potentially from left pleural effusion and/or left lower lobe atelectasis/pneumonia. Stable. 6/2 echocardiogram;LVEF=  60%- 65%.    Cultures 5/31 blood right hand positive PROVIDENCIA STUARTII; GPC; reincubated for better growth 5/31 blood right AC NGTD 5/31 urine positive multiple species 5/31 MRSA by PCR negative 6/2 urine  positive multiple species   Antimicrobials: Zosyn 5/31>> 6/4 Vancomycin 5/31>> 6/4 Bactrim 6/4    Devices    LINES / TUBES:  -Chronic Foley    Continuous Infusions: . sodium chloride 75 mL/hr at 11/15/15 1325     Subjective: 6/4  Afebrile last 24 hours. After BM abdominal pain resolved. States the reason unable to ambulate secondary to stopping her MS medication because she was afraid of needles. States has been considering obtaining new neurologist and starting meds if they would help.    Objective: Filed Vitals:   11/15/15 1100 11/15/15 1200 11/15/15 1300 11/15/15 1400  BP: 128/66 121/67 125/66 129/69  Pulse: 92 88 82 88  Temp: 99.1 F (37.3 C)     TempSrc: Oral     Resp: 26 22 22 26   Height:      Weight:      SpO2: 95% 96% 97%  97%    Intake/Output Summary (Last 24 hours) at 11/15/15 1402 Last data filed at 11/15/15 1300  Gross per 24 hour  Intake 5019.33 ml  Output   2700 ml  Net 2319.33 ml   Filed Weights   11/11/15 1011 11/13/15 0500  Weight: 59.875 kg (132 lb) 69 kg (152 lb 1.9 oz)    Examination:  General: A/O 4, NAD, No acute respiratory distress Eyes: negative scleral hemorrhage, negative anisocoria, negative icterus ENT: Negative Runny nose, negative gingival bleeding, Neck:  Negative scars, masses, torticollis, lymphadenopathy, JVD Lungs: Clear to auscultation bilaterally without wheezes or crackles Cardiovascular: Regular  rhythm And rate without murmur gallop or rub normal S1 and S2 Abdomen: Negative suprapubic abdominal pain to palpation, nondistended, positive soft, bowel sounds, no rebound, no ascites, no appreciable mass, negative bilateral CVA tenderness  Extremities: No significant cyanosis, clubbing, or edema bilateral lower extremities Skin: Negative rashes, lesions, ulcers Psychiatric:  Negative depression, negative anxiety, negative fatigue, negative mania  Central nervous system:  Cranial nerves II through XII intact, tongue/uvula midline, all extremities muscle strength 5/5, sensation intact throughout, negative dysarthria, negative expressive aphasia, negative receptive aphasia.  .     Data Reviewed: Care during the described time interval was provided by me .  I have reviewed this patient's available data, including medical history, events of note, physical examination, and all test results as part of my evaluation. I have personally  reviewed and interpreted all radiology studies.  CBC:  Recent Labs Lab 11/11/15 0929 11/12/15 0400 11/13/15 0544 11/14/15 0325 11/14/15 1709 11/15/15 0632  WBC 26.8* 16.3* 18.4* 13.2*  --  11.9*  NEUTROABS 24.4*  --  15.4* 9.6*  --  7.4  HGB 9.4* 7.9* 8.8* 6.2* 6.3* 8.7*  HCT 29.7* 25.5* 28.5* 20.0* 20.3* 27.2*  MCV 86.8 87.9 87.4 85.5   --  82.4  PLT 478* 382 381 355  --  [id number]   Basic Metabolic Panel:  Recent Labs Lab 11/11/15 1447 11/12/15 0400 11/12/15 2334 11/13/15 0544 11/13/15 2115 11/14/15 0325 11/15/15 0632  NA  --  143  --  142 142 144 143  K  --  3.0*  --  3.4* 3.4* 3.6 3.3*  CL  --  115*  --  115* 115* 115* 110  CO2  --  20*  --  20* 22 25 25   GLUCOSE  --  117*  --  123* 211* 151* 121*  BUN  --  32*  --  16 14 15 16   CREATININE 1.27* 0.82  --  0.62 0.71 0.67 0.61  CALCIUM  --  9.3  --  9.8 9.5 9.5 9.6  MG 1.7  --   --  1.7  --  1.7 1.9  PHOS 4.3  --  1.8*  --   --   --   --    GFR: Estimated Creatinine Clearance: 72.6 mL/min (by C-G formula based on Cr of 0.61). Liver Function Tests:  Recent Labs Lab 11/11/15 0929 11/12/15 0400 11/12/15 2334 11/13/15 0544 11/14/15 0325 11/15/15 0632  AST 14* 17  --  19 19 18   ALT 11* 13*  --  18 23 25   ALKPHOS 45 54  --  77 70 82  BILITOT 0.5 0.5  --  0.5 0.3 1.0  PROT 7.3 6.3*  --  6.9 5.8* 5.6*  ALBUMIN 2.9* 2.3* 2.4* 2.3* 1.7* 1.7*   No results for input(s): LIPASE, AMYLASE in the last 168 hours. No results for input(s): AMMONIA in the last 168 hours. Coagulation Profile:  Recent Labs Lab 11/11/15 1447  INR 1.86*   Cardiac Enzymes:  Recent Labs Lab 11/11/15 1627  TROPONINI <0.03   BNP (last 3 results) No results for input(s): PROBNP in the last 8760 hours. HbA1C: No results for input(s): HGBA1C in the last 72 hours. CBG:  Recent Labs Lab 11/14/15 1231 11/14/15 1653 11/14/15 2204 11/15/15 0739 11/15/15 1216  GLUCAP 134* 200* 153* 128* 216*   Lipid Profile: No results for input(s): CHOL, HDL, LDLCALC, TRIG, CHOLHDL, LDLDIRECT in the last 72 hours. Thyroid Function Tests: No results for input(s): TSH, T4TOTAL, FREET4, T3FREE, THYROIDAB in the last 72 hours. Anemia Panel: No results for input(s): VITAMINB12, FOLATE, FERRITIN, TIBC, IRON, RETICCTPCT in the last 72 hours. Urine analysis:    Component Value Date/Time    COLORURINE YELLOW 11/11/2015 1011   APPEARANCEUR TURBID* 11/11/2015 1011   LABSPEC 1.017 11/11/2015 1011   PHURINE 7.0 11/11/2015 1011   GLUCOSEU NEGATIVE 11/11/2015 1011   HGBUR LARGE* 11/11/2015 [place] 11/11/2015 1011   KETONESUR NEGATIVE 11/11/2015 1011   PROTEINUR 30* 11/11/2015 1011   UROBILINOGEN 0.2 12/20/2014 1851   NITRITE POSITIVE* 11/11/2015 1011   LEUKOCYTESUR LARGE* 11/11/2015 1011   Sepsis Labs: @LABRCNTIP (procalcitonin:4,lacticidven:4)  ) Recent Results (from the past 240 hour(s))  Blood Culture (routine x 2)     Status: Abnormal (Preliminary result)   Collection Time: 11/11/15  9:00  AM  Result Value Ref Range Status   Specimen Description BLOOD RIGHT HAND  Final   Special Requests BOTTLES DRAWN AEROBIC AND ANAEROBIC 5CC  Final   Culture  Setup Time   Final    GRAM NEGATIVE RODS GRAM POSITIVE COCCI AEROBIC BOTTLE ONLY CRITICAL RESULT CALLED TO, READ BACK BY AND VERIFIED WITH: CLamont Snowball D AT 0916 ON [id number] BY SKY    Culture (A)  Final    PROVIDENCIA STUARTII GRAM POSITIVE COCCI CULTURE REINCUBATED FOR BETTER GROWTH    Report Status PENDING  Incomplete   Organism ID, Bacteria PROVIDENCIA STUARTII  Final      Susceptibility   Providencia stuartii - MIC*    AMPICILLIN 16 RESISTANT Resistant     CEFAZOLIN >=64 RESISTANT Resistant     CEFEPIME <=1 SENSITIVE Sensitive     CEFTAZIDIME <=1 SENSITIVE Sensitive     CEFTRIAXONE <=1 SENSITIVE Sensitive     CIPROFLOXACIN >=4 RESISTANT Resistant     GENTAMICIN 8 RESISTANT Resistant     IMIPENEM 1 SENSITIVE Sensitive     TRIMETH/SULFA <=20 SENSITIVE Sensitive     AMPICILLIN/SULBACTAM 4 SENSITIVE Sensitive     PIP/TAZO <=4 SENSITIVE Sensitive     * PROVIDENCIA STUARTII  Blood Culture ID Panel (Reflexed)     Status: Abnormal   Collection Time: 11/11/15  9:00 AM  Result Value Ref Range Status   Enterococcus species NOT DETECTED NOT DETECTED Final   Vancomycin resistance NOT DETECTED NOT  DETECTED Final   Listeria monocytogenes NOT DETECTED NOT DETECTED Final   Staphylococcus species DETECTED (A) NOT DETECTED Final    Comment: CRITICAL RESULT CALLED TO, READ BACK BY AND VERIFIED WITH: C. STEWART, PHARM D AT 0916 ON [id number] BY S. YARBROUGH    Staphylococcus aureus NOT DETECTED NOT DETECTED Final   Methicillin resistance DETECTED (A) NOT DETECTED Final    Comment: CRITICAL RESULT CALLED TO, READ BACK BY AND VERIFIED WITH: C. STEWART, PHARM D AT 0916 ON [id number] BY S. YARBROUGH    Streptococcus species NOT DETECTED NOT DETECTED Final   Streptococcus agalactiae NOT DETECTED NOT DETECTED Final   Streptococcus pneumoniae NOT DETECTED NOT DETECTED Final   Streptococcus pyogenes NOT DETECTED NOT DETECTED Final   Acinetobacter baumannii NOT DETECTED NOT DETECTED Final   Enterobacteriaceae species NOT DETECTED NOT DETECTED Final   Enterobacter cloacae complex NOT DETECTED NOT DETECTED Final   Escherichia coli NOT DETECTED NOT DETECTED Final   Klebsiella oxytoca NOT DETECTED NOT DETECTED Final   Klebsiella pneumoniae NOT DETECTED NOT DETECTED Final   Proteus species NOT DETECTED NOT DETECTED Final   Serratia marcescens NOT DETECTED NOT DETECTED Final   Carbapenem resistance NOT DETECTED NOT DETECTED Final   Haemophilus influenzae NOT DETECTED NOT DETECTED Final   Neisseria meningitidis NOT DETECTED NOT DETECTED Final   Pseudomonas aeruginosa NOT DETECTED NOT DETECTED Final   Candida albicans NOT DETECTED NOT DETECTED Final   Candida glabrata NOT DETECTED NOT DETECTED Final   Candida krusei NOT DETECTED NOT DETECTED Final   Candida parapsilosis NOT DETECTED NOT DETECTED Final   Candida tropicalis NOT DETECTED NOT DETECTED Final  Blood Culture (routine x 2)     Status: None (Preliminary result)   Collection Time: 11/11/15  9:10 AM  Result Value Ref Range Status   Specimen Description BLOOD RIGHT ANTECUBITAL  Final   Special Requests   Final    BOTTLES DRAWN AEROBIC AND ANAEROBIC  10CC AER 5CC ANA   Culture NO GROWTH 3 DAYS  Final   Report Status PENDING  Incomplete  Urine culture     Status: Abnormal   Collection Time: 11/11/15 10:11 AM  Result Value Ref Range Status   Specimen Description URINE, RANDOM  Final   Special Requests NONE  Final   Culture MULTIPLE SPECIES PRESENT, SUGGEST RECOLLECTION (A)  Final   Report Status 11/12/2015 FINAL  Final  MRSA PCR Screening     Status: None   Collection Time: 11/11/15  4:21 PM  Result Value Ref Range Status   MRSA by PCR NEGATIVE NEGATIVE Final    Comment:        The GeneXpert MRSA Assay (FDA approved for NASAL specimens only), is one component of a comprehensive MRSA colonization surveillance program. It is not intended to diagnose MRSA infection nor to guide or monitor treatment for MRSA infections.   Culture, Urine     Status: Abnormal   Collection Time: 11/13/15 12:50 PM  Result Value Ref Range Status   Specimen Description URINE, CATHETERIZED  Final   Special Requests NONE  Final   Culture MULTIPLE SPECIES PRESENT, SUGGEST RECOLLECTION (A)  Final   Report Status 11/14/2015 FINAL  Final         Radiology Studies: No results found.      Scheduled Meds: . baclofen  10 mg Oral TID  . darifenacin  7.5 mg Oral Daily  . feeding supplement (ENSURE ENLIVE)  237 mL Oral BID BM  . feeding supplement (PRO-STAT SUGAR FREE 64)  30 mL Oral BID  . gabapentin  100 mg Oral QHS  . insulin aspart  0-5 Units Subcutaneous QHS  . insulin aspart  0-9 Units Subcutaneous TID WC  . polyethylene glycol  17 g Oral BID  . potassium chloride  40 mEq Oral Once  . pravastatin  20 mg Oral q1800  . rivaroxaban  20 mg Oral Q supper  . sodium chloride flush  3 mL Intravenous Q12H  . sulfamethoxazole-trimethoprim  1 tablet Oral Q12H   Continuous Infusions: . sodium chloride 75 mL/hr at 11/15/15 1325     LOS: 4 days    Time spent: 40 minutes     WOODS, Geraldo Docker, MD [facility] Pager [phone]   If  7PM-7AM, please contact night-coverage [email or web address] Password [email or web address] 11/15/2015, 2:02 PM

## 2015-11-15 NOTE — Progress Notes (Signed)
Pharmacy Antibiotic Note  [name] is a 55 y.o. female admitted on 11/11/2015 with sepsis with likely UTI.  Pharmacy managing vancomycin/Zosyn dosing.  Day #5 of abx for Providencia bacteremia and complicated pyelo. Afebrile, WBC down to 11.9 (peaked at 18.4)   Plan: Stop vancomycin and Zosyn Change to Bactrim 1 DS tab PO BID for remaining 10 days   Height: 5\' 6"  (167.6 cm) Weight: 152 lb 1.9 oz (69 kg) IBW/kg (Calculated) : 59.3  Temp (24hrs), Avg:99.3 F (37.4 C), Min:98.4 F (36.9 C), Max:99.8 F (37.7 C)   Recent Labs Lab 11/11/15 0929 11/11/15 1444 11/11/15 1446  11/11/15 1627 11/12/15 0400 11/12/15 0809 11/13/15 0544 11/13/15 2115 11/14/15 0325 11/15/15 0632  WBC 26.8*  --   --   --   --  16.3*  --  18.4*  --  13.2* 11.9*  CREATININE 1.39*  --   --   < >  --  0.82  --  0.62 0.71 0.67 0.61  LATICACIDVEN  --  5.42* 4.8*  --  5.5* 2.5* 1.3  --   --   --   --   < > = values in this interval not displayed.  Estimated Creatinine Clearance: 72.6 mL/min (by C-G formula based on Cr of 0.61).    No Known Allergies  Antimicrobials this admission: 5/31 vancomycin >> 6/4 5/31 Zosyn >> 6/4 6/4 Bactrim >> (6/13)  Dose adjustments this admission: n/a  Microbiology results: 5/31 blood cx > 1/2 CONS and Providencia (R to ampicillin / cefazolin / cipro / gent) 5/31 urine cx > multiple species present 6/2 urine cx > sent MRSA PCR negative  Thank you for allowing pharmacy to be a part of this patient's care.  Elenor Quinones, PharmD, BCPS Clinical Pharmacist Pager [phone] 11/15/2015 1:51 PM

## 2015-11-16 DIAGNOSIS — L8990 Pressure ulcer of unspecified site, unspecified stage: Secondary | ICD-10-CM

## 2015-11-16 DIAGNOSIS — G9340 Encephalopathy, unspecified: Secondary | ICD-10-CM

## 2015-11-16 DIAGNOSIS — N319 Neuromuscular dysfunction of bladder, unspecified: Secondary | ICD-10-CM

## 2015-11-16 DIAGNOSIS — G35 Multiple sclerosis: Secondary | ICD-10-CM

## 2015-11-16 DIAGNOSIS — N10 Acute tubulo-interstitial nephritis: Secondary | ICD-10-CM

## 2015-11-16 DIAGNOSIS — N179 Acute kidney failure, unspecified: Secondary | ICD-10-CM

## 2015-11-16 LAB — CBC WITH DIFFERENTIAL/PLATELET
BASOS ABS: 0.1 10*3/uL (ref 0.0–0.1)
Basophils Relative: 1 %
Eosinophils Absolute: 0.2 10*3/uL (ref 0.0–0.7)
Eosinophils Relative: 2 %
HEMATOCRIT: 27.1 % — AB (ref 36.0–46.0)
Hemoglobin: 8.6 g/dL — ABNORMAL LOW (ref 12.0–15.0)
LYMPHS ABS: 3.8 10*3/uL (ref 0.7–4.0)
Lymphocytes Relative: 34 %
MCH: 26.3 pg (ref 26.0–34.0)
MCHC: 31.7 g/dL (ref 30.0–36.0)
MCV: 82.9 fL (ref 78.0–100.0)
MONO ABS: 0.9 10*3/uL (ref 0.1–1.0)
MONOS PCT: 8 %
NEUTROS ABS: 6.3 10*3/uL (ref 1.7–7.7)
Neutrophils Relative %: 55 %
PLATELETS: 320 10*3/uL (ref 150–400)
RBC: 3.27 MIL/uL — ABNORMAL LOW (ref 3.87–5.11)
RDW: 18.8 % — AB (ref 11.5–15.5)
WBC: 11.3 10*3/uL — ABNORMAL HIGH (ref 4.0–10.5)

## 2015-11-16 LAB — COMPREHENSIVE METABOLIC PANEL
ALBUMIN: 1.9 g/dL — AB (ref 3.5–5.0)
ALK PHOS: 75 U/L (ref 38–126)
ALT: 19 U/L (ref 14–54)
AST: 11 U/L — AB (ref 15–41)
Anion gap: 5 (ref 5–15)
BILIRUBIN TOTAL: 0.6 mg/dL (ref 0.3–1.2)
BUN: 15 mg/dL (ref 6–20)
CO2: 24 mmol/L (ref 22–32)
CREATININE: 0.61 mg/dL (ref 0.44–1.00)
Calcium: 9.2 mg/dL (ref 8.9–10.3)
Chloride: 112 mmol/L — ABNORMAL HIGH (ref 101–111)
GFR calc Af Amer: >60 mL/min (ref >60)
GLUCOSE: 108 mg/dL — AB (ref 65–99)
POTASSIUM: 4.2 mmol/L (ref 3.5–5.1)
Sodium: 141 mmol/L (ref 135–145)
TOTAL PROTEIN: 5.8 g/dL — AB (ref 6.5–8.1)

## 2015-11-16 LAB — CULTURE, BLOOD (ROUTINE X 2): CULTURE: NO GROWTH

## 2015-11-16 LAB — TYPE AND SCREEN
ABO/RH(D): A POS
Antibody Screen: NEGATIVE
Unit division: 0
Unit division: 0

## 2015-11-16 LAB — GLUCOSE, CAPILLARY
GLUCOSE-CAPILLARY: 113 mg/dL — AB (ref 65–99)
GLUCOSE-CAPILLARY: 190 mg/dL — AB (ref 65–99)
GLUCOSE-CAPILLARY: 153 mg/dL — AB (ref 65–99)
Glucose-Capillary: 174 mg/dL — ABNORMAL HIGH (ref 65–99)

## 2015-11-16 LAB — MAGNESIUM: Magnesium: 1.7 mg/dL (ref 1.7–2.4)

## 2015-11-16 MED ORDER — ALBUTEROL SULFATE (2.5 MG/3ML) 0.083% IN NEBU: 3.0000 mL | INHALATION_SOLUTION | RESPIRATORY_TRACT | Status: DC | PRN

## 2015-11-16 NOTE — Progress Notes (Signed)
CRITICAL VALUE ALERT  Critical value received:  Pediatric Bottle - yeast in blood  Date of notification:  11/16/15  Time of notification:  0303  Critical value read back: yes  Nurse who received alert:  Candiss Norse RN  MD notified (1st page):  L. Harduk NP  Time of first page:  0311  MD notified (2nd page):  Time of second page:  Responding MD: Roger Shelter NP  Time MD responded:  [phone]  See MAR

## 2015-11-16 NOTE — Progress Notes (Signed)
[place] TEAM 1 - Stepdown/ICU TEAM  [name]  [id number] DOB: 09/06/1960 DOA: 11/11/2015 PCP: Gildardo Cranker, DO    Brief Narrative:  55 y.o. F Hx MS, Chronic Decubitus ulcers, CKD stage III, Protein Calorie Malnutrition, Bedbound, Chronic UTIs with indwelling Foley catheter, DM2, and DVT who presented from her nursing home somnolent and confused w/ a BP of 71/47.   Assessment & Plan:  Sepsis due to Providencia and Coag Neg Staph Bacteremia and Pyelonephritis  -Most likely from chronic indwelling catheter -blood cx positive for PROVIDENCIA STUARTII & coag negative staph (1 of 2)  -Bactrim to complete 14 days of total tx   Acute renal failure -baseline Cr~ 0.9 -resolved with hydration  Anemia -transfused 2 units PRBC this admit  -occult blood negative -likely chronic anemia w/ "drop" due to hydration in a DH pt - check Fe studies to determine if Fe dosing indicated   Neurogenic bladder with Chronic indwelling Foley catheter -this has been the source of the patient's last 2 admissions -follow-up with her Urologist as outpatient   Multiple sclerosis -not on medication secondary to being scared of needles - resulted in her becoming bedbound -if desires new Neurologist, this will need to be coordinated as an outpt   Moderate Protein calorie malnutrition -Nutrition consult  Several superficial dime sized decubitus ulcers on bilat buttocks -Air overlay - WOC eval pending   DM type II controlled with complication -[id number] 5.9 - CBG reasonably controlled  DVT -continue xarelto  Dependent edema -EF 60-65% on TTE w/ no WMA w/ no DD  HLD -Continue statin  Hypokalemia -corrected   Hypophosphatemia -f/u in AM  Constipation -Resolved with magnesium citrate + soapsuds enema - continue MiraLAX BID  DVT prophylaxis: Eliquis Code Status: NO CODE / DNR  Family Communication: no family present at time of exam  Disposition Plan: pt strongly desires d/c home and  not SLP - will ask PT/OT to eval - transfer to med bed   Consultants:  None  Procedures: 6/2 TTE - EF 60-65% - no DD - no WMA   Antimicrobials:  Zosyn 5/31 > 6/3 Vancomycin 5/31 > 6/4 Bactrim 6/4 >  Subjective: The patient is comfortable in bed.  She denies pain at the present time.  She denies shortness of breath.  She denies nausea or vomiting.  She feels very weak in general.  She is normally able to stand and pivot into a mobilized wheelchair.  Objective: Blood pressure 137/86, pulse 86, temperature 98.8 F (37.1 C), temperature source Oral, resp. rate 24, height 5\' 6"  (1.676 m), weight 75.8 kg (167 lb 1.7 oz), SpO2 91 %.  Intake/Output Summary (Last 24 hours) at 11/16/15 1606 Last data filed at 11/16/15 1253  Gross per 24 hour  Intake   1810 ml  Output   4151 ml  Net  -2341 ml   Filed Weights   11/11/15 1011 11/13/15 0500 11/16/15 0530  Weight: 59.875 kg (132 lb) 69 kg (152 lb 1.9 oz) 75.8 kg (167 lb 1.7 oz)    Examination: General: No acute respiratory distress Lungs: Clear to auscultation bilaterally without wheezes or crackles Cardiovascular: Regular rate and rhythm without murmur gallop or rub normal S1 and S2 Abdomen: Nontender, nondistended, soft, bowel sounds positive, no rebound, no ascites, no appreciable mass Extremities: No significant cyanosis, or clubbing - trace edema bilateral lower extremities  CBC:  Recent Labs Lab 11/11/15 0929 11/12/15 0400 11/13/15 0544 11/14/15 0325 11/14/15 1709 11/15/15 0632 11/16/15 0334  WBC  26.8* 16.3* 18.4* 13.2*  --  11.9* 11.3*  NEUTROABS 24.4*  --  15.4* 9.6*  --  7.4 6.3  HGB 9.4* 7.9* 8.8* 6.2* 6.3* 8.7* 8.6*  HCT 29.7* 25.5* 28.5* 20.0* 20.3* 27.2* 27.1*  MCV 86.8 87.9 87.4 85.5  --  82.4 82.9  PLT 478* 382 381 355  --  307 [id number]   Basic Metabolic Panel:  Recent Labs Lab 11/11/15 1447  11/12/15 2334 11/13/15 0544 11/13/15 2115 11/14/15 0325 11/15/15 0632 11/16/15 0334  NA  --   < >  --  142 142  144 143 141  K  --   < >  --  3.4* 3.4* 3.6 3.3* 4.2  CL  --   < >  --  115* 115* 115* 110 112*  CO2  --   < >  --  20* 22 25 25 24   GLUCOSE  --   < >  --  123* 211* 151* 121* 108*  BUN  --   < >  --  16 14 15 16 15   CREATININE 1.27*  < >  --  0.62 0.71 0.67 0.61 0.61  CALCIUM  --   < >  --  9.8 9.5 9.5 9.6 9.2  MG 1.7  --   --  1.7  --  1.7 1.9 1.7  PHOS 4.3  --  1.8*  --   --   --   --   --   < > = values in this interval not displayed. GFR: Estimated Creatinine Clearance: 80.7 mL/min (by C-G formula based on Cr of 0.61).  Liver Function Tests:  Recent Labs Lab 11/12/15 0400 11/12/15 2334 11/13/15 0544 11/14/15 0325 11/15/15 0632 11/16/15 0334  AST 17  --  19 19 18  11*  ALT 13*  --  18 23 25 19   ALKPHOS 54  --  77 70 82 75  BILITOT 0.5  --  0.5 0.3 1.0 0.6  PROT 6.3*  --  6.9 5.8* 5.6* 5.8*  ALBUMIN 2.3* 2.4* 2.3* 1.7* 1.7* 1.9*    Coagulation Profile:  Recent Labs Lab 11/11/15 1447  INR 1.86*    Cardiac Enzymes:  Recent Labs Lab 11/11/15 1627  TROPONINI <0.03    HbA1C: HGB A1C MFR BLD  Date/Time Value Ref Range Status  11/11/2015 02:48 PM 5.9* 4.8 - 5.6 % Final    Comment:    (NOTE)         Pre-diabetes: 5.7 - 6.4         Diabetes: >6.4         Glycemic control for adults with diabetes: <7.0   07/20/2015 11:20 PM 5.8* 4.8 - 5.6 % Final    Comment:    (NOTE)         Pre-diabetes: 5.7 - 6.4         Diabetes: >6.4         Glycemic control for adults with diabetes: <7.0     CBG:  Recent Labs Lab 11/15/15 1216 11/15/15 1630 11/15/15 2212 11/16/15 1000 11/16/15 1315  GLUCAP 216* 197* 170* 113* 174*    Recent Results (from the past 240 hour(s))  Blood Culture (routine x 2)     Status: Abnormal   Collection Time: 11/11/15  9:00 AM  Result Value Ref Range Status   Specimen Description BLOOD RIGHT HAND  Final   Special Requests BOTTLES DRAWN AEROBIC AND ANAEROBIC 5CC  Final   Culture  Setup Time   Final  GRAM NEGATIVE RODS GRAM  POSITIVE COCCI AEROBIC BOTTLE ONLY CRITICAL RESULT CALLED TO, READ BACK BY AND VERIFIED WITH: C. STEWART, PHARM D AT 0916 ON [id number] BY SKY    Culture (A)  Final    PROVIDENCIA STUARTII STAPHYLOCOCCUS SPECIES (COAGULASE NEGATIVE) THE SIGNIFICANCE OF ISOLATING THIS ORGANISM FROM A SINGLE SET OF BLOOD CULTURES WHEN MULTIPLE SETS ARE DRAWN IS UNCERTAIN. PLEASE NOTIFY THE MICROBIOLOGY DEPARTMENT WITHIN ONE WEEK IF SPECIATION AND SENSITIVITIES ARE REQUIRED.    Report Status 11/16/2015 FINAL  Final   Organism ID, Bacteria PROVIDENCIA STUARTII  Final      Susceptibility   Providencia stuartii - MIC*    AMPICILLIN 16 RESISTANT Resistant     CEFAZOLIN >=64 RESISTANT Resistant     CEFEPIME <=1 SENSITIVE Sensitive     CEFTAZIDIME <=1 SENSITIVE Sensitive     CEFTRIAXONE <=1 SENSITIVE Sensitive     CIPROFLOXACIN >=4 RESISTANT Resistant     GENTAMICIN 8 RESISTANT Resistant     IMIPENEM 1 SENSITIVE Sensitive     TRIMETH/SULFA <=20 SENSITIVE Sensitive     AMPICILLIN/SULBACTAM 4 SENSITIVE Sensitive     PIP/TAZO <=4 SENSITIVE Sensitive     * PROVIDENCIA STUARTII  Blood Culture ID Panel (Reflexed)     Status: Abnormal   Collection Time: 11/11/15  9:00 AM  Result Value Ref Range Status   Enterococcus species NOT DETECTED NOT DETECTED Final   Vancomycin resistance NOT DETECTED NOT DETECTED Final   Listeria monocytogenes NOT DETECTED NOT DETECTED Final   Staphylococcus species DETECTED (A) NOT DETECTED Final    Comment: CRITICAL RESULT CALLED TO, READ BACK BY AND VERIFIED WITH: C. STEWART, PHARM D AT 0916 ON [id number] BY S. YARBROUGH    Staphylococcus aureus NOT DETECTED NOT DETECTED Final   Methicillin resistance DETECTED (A) NOT DETECTED Final    Comment: CRITICAL RESULT CALLED TO, READ BACK BY AND VERIFIED WITH: C. STEWART, PHARM D AT 0916 ON [id number] BY S. YARBROUGH    Streptococcus species NOT DETECTED NOT DETECTED Final   Streptococcus agalactiae NOT DETECTED NOT DETECTED Final   Streptococcus  pneumoniae NOT DETECTED NOT DETECTED Final   Streptococcus pyogenes NOT DETECTED NOT DETECTED Final   Acinetobacter baumannii NOT DETECTED NOT DETECTED Final   Enterobacteriaceae species NOT DETECTED NOT DETECTED Final   Enterobacter cloacae complex NOT DETECTED NOT DETECTED Final   Escherichia coli NOT DETECTED NOT DETECTED Final   Klebsiella oxytoca NOT DETECTED NOT DETECTED Final   Klebsiella pneumoniae NOT DETECTED NOT DETECTED Final   Proteus species NOT DETECTED NOT DETECTED Final   Serratia marcescens NOT DETECTED NOT DETECTED Final   Carbapenem resistance NOT DETECTED NOT DETECTED Final   Haemophilus influenzae NOT DETECTED NOT DETECTED Final   Neisseria meningitidis NOT DETECTED NOT DETECTED Final   Pseudomonas aeruginosa NOT DETECTED NOT DETECTED Final   Candida albicans NOT DETECTED NOT DETECTED Final   Candida glabrata NOT DETECTED NOT DETECTED Final   Candida krusei NOT DETECTED NOT DETECTED Final   Candida parapsilosis NOT DETECTED NOT DETECTED Final   Candida tropicalis NOT DETECTED NOT DETECTED Final  Blood Culture (routine x 2)     Status: None   Collection Time: 11/11/15  9:10 AM  Result Value Ref Range Status   Specimen Description BLOOD RIGHT ANTECUBITAL  Final   Special Requests   Final    BOTTLES DRAWN AEROBIC AND ANAEROBIC 10CC AER 5CC ANA   Culture NO GROWTH 5 DAYS  Final   Report Status 11/16/2015 FINAL  Final  Urine  culture     Status: Abnormal   Collection Time: 11/11/15 10:11 AM  Result Value Ref Range Status   Specimen Description URINE, RANDOM  Final   Special Requests NONE  Final   Culture MULTIPLE SPECIES PRESENT, SUGGEST RECOLLECTION (A)  Final   Report Status 11/12/2015 FINAL  Final  MRSA PCR Screening     Status: None   Collection Time: 11/11/15  4:21 PM  Result Value Ref Range Status   MRSA by PCR NEGATIVE NEGATIVE Final    Comment:        The GeneXpert MRSA Assay (FDA approved for NASAL specimens only), is one component of a comprehensive  MRSA colonization surveillance program. It is not intended to diagnose MRSA infection nor to guide or monitor treatment for MRSA infections.   Culture, Urine     Status: Abnormal   Collection Time: 11/13/15 12:50 PM  Result Value Ref Range Status   Specimen Description URINE, CATHETERIZED  Final   Special Requests NONE  Final   Culture MULTIPLE SPECIES PRESENT, SUGGEST RECOLLECTION (A)  Final   Report Status 11/14/2015 FINAL  Final     Scheduled Meds: . baclofen  10 mg Oral TID  . darifenacin  7.5 mg Oral Daily  . feeding supplement (ENSURE ENLIVE)  237 mL Oral BID BM  . feeding supplement (PRO-STAT SUGAR FREE 64)  30 mL Oral BID  . gabapentin  100 mg Oral QHS  . insulin aspart  0-5 Units Subcutaneous QHS  . insulin aspart  0-9 Units Subcutaneous TID WC  . polyethylene glycol  17 g Oral BID  . pravastatin  20 mg Oral q1800  . rivaroxaban  20 mg Oral Q supper  . sodium chloride flush  3 mL Intravenous Q12H  . sulfamethoxazole-trimethoprim  1 tablet Oral Q12H   Continuous Infusions: . sodium chloride 75 mL/hr at 11/15/15 2103     LOS: 5 days   Time spent: 35 minutes   Cherene Altes, MD [facility] Office  [phone] Pager - Text Page per Shea Evans as per below:  On-Call/Text Page:      [email or web address]      password [email or web address]  If 7PM-7AM, please contact night-coverage [email or web address] Password [email or web address] 11/16/2015, 4:06 PM

## 2015-11-16 NOTE — Clinical Documentation Improvement (Signed)
Hospitalist   Please document query responses in the progress notes and discharge summary, not on the CDI BPA form in [facility]. Please do not deactivate queries without responding. Thank you!  Please document the severity of the patient's protein calorie malnutrition:  - Moderate  - Other severity  - Unable to clinically determine  Clinical Information: RD assessment 11/12/15 at 11:19 am by Phillips Odor: Non-severe (moderate) malnutrition in context of chronic illness  INTERVENTION: . Ensure Enlive po BID, each supplement provides 350 kcal and 20 grams of protein  . Continue Prostat liquid protein po 30 ml BID with meals, each supplement provides 100 kcal, 15 grams protein  NUTRITION DIAGNOSIS:  Malnutrition related to chronic illness as evidenced by moderate depletion of body fat, moderate depletions of muscle mass    Please exercise your independent, professional judgment when responding. A specific answer is not anticipated or expected.   Thank You, Erling Conte  RN BSN CCDS [phone] Health Information Management [place]

## 2015-11-16 NOTE — Care Management Important Message (Signed)
Important Message  Patient Details  Name: [name] MRN: [id number] Date of Birth: 1960-09-07   Medicare Important Message Given:  Yes    [name] Abena 11/16/2015, 11:26 AM

## 2015-11-17 LAB — CBC
HEMATOCRIT: 27.9 % — AB (ref 36.0–46.0)
HEMOGLOBIN: 8.7 g/dL — AB (ref 12.0–15.0)
MCH: 25.8 pg — ABNORMAL LOW (ref 26.0–34.0)
MCHC: 31.2 g/dL (ref 30.0–36.0)
MCV: 82.8 fL (ref 78.0–100.0)
Platelets: 384 10*3/uL (ref 150–400)
RBC: 3.37 MIL/uL — AB (ref 3.87–5.11)
RDW: 18.2 % — AB (ref 11.5–15.5)
WBC: 11.2 10*3/uL — AB (ref 4.0–10.5)

## 2015-11-17 LAB — GLUCOSE, CAPILLARY
GLUCOSE-CAPILLARY: 96 mg/dL (ref 65–99)
GLUCOSE-CAPILLARY: 214 mg/dL — AB (ref 65–99)
GLUCOSE-CAPILLARY: 151 mg/dL — AB (ref 65–99)
Glucose-Capillary: 119 mg/dL — ABNORMAL HIGH (ref 65–99)

## 2015-11-17 LAB — COMPREHENSIVE METABOLIC PANEL
ALBUMIN: 2 g/dL — AB (ref 3.5–5.0)
ALK PHOS: 62 U/L (ref 38–126)
ALT: 15 U/L (ref 14–54)
AST: 9 U/L — AB (ref 15–41)
Anion gap: 5 (ref 5–15)
BILIRUBIN TOTAL: 0.4 mg/dL (ref 0.3–1.2)
BUN: 13 mg/dL (ref 6–20)
CALCIUM: 9.5 mg/dL (ref 8.9–10.3)
CO2: 25 mmol/L (ref 22–32)
Chloride: 110 mmol/L (ref 101–111)
Creatinine, Ser: 0.54 mg/dL (ref 0.44–1.00)
GFR calc Af Amer: >60 mL/min (ref >60)
GFR calc non Af Amer: >60 mL/min (ref >60)
Glucose, Bld: 93 mg/dL (ref 65–99)
POTASSIUM: 3.7 mmol/L (ref 3.5–5.1)
Sodium: 140 mmol/L (ref 135–145)
Total Protein: 6 g/dL — ABNORMAL LOW (ref 6.5–8.1)

## 2015-11-17 LAB — MAGNESIUM: MAGNESIUM: 1.8 mg/dL (ref 1.7–2.4)

## 2015-11-17 LAB — RETICULOCYTES
RBC.: 3.37 MIL/uL — ABNORMAL LOW (ref 3.87–5.11)
RETIC CT PCT: 1 % (ref 0.4–3.1)
Retic Count, Absolute: 33.7 10*3/uL (ref 19.0–186.0)

## 2015-11-17 LAB — FOLATE: FOLATE: 19.3 ng/mL (ref >5.9)

## 2015-11-17 LAB — VITAMIN B12: VITAMIN B 12: 866 pg/mL (ref 180–914)

## 2015-11-17 LAB — PHOSPHORUS: PHOSPHORUS: 3.2 mg/dL (ref 2.5–4.6)

## 2015-11-17 LAB — IRON AND TIBC
Iron: 42 ug/dL (ref 28–170)
Saturation Ratios: 17 % (ref 10.4–31.8)
TIBC: 252 ug/dL (ref 250–450)
UIBC: 210 ug/dL

## 2015-11-17 LAB — FERRITIN: Ferritin: 83 ng/mL (ref 11–307)

## 2015-11-17 NOTE — Progress Notes (Signed)
CSW informed pt now asking about going home.  CSW met with pt at bedside to discuss.  Pt states that she has been at SNF for too long and wants to go home- states that her ex-boyfriend, Gene, lives at her house and was previously assisting at home.  States that Gene works night shift and was available during the day and would put pt to bed at night and help her into her wheelchair at home.  CSW received permission to speak with pt cousin [name] and with Gene in regards to pt request to go home.  CSW spoke with [name] is has concerns about pt returning home but is agreeable to pt going home if that's what she really wants- states that she would check on patient daily if she goes home.  Received Genes number from [name] - [phone]  CSW spoke with Gene who confirms he lives at pts house and could continue to help patient with transfers in morning and evening and that [facility] had been looking into getting them a hospital bed at home and some nursing assistance.  CSW informed RNCM who will follow up with pt about possible return home, CSW also spoke with [facility]- they should have a SNF bed available but is now informing CSW about outstanding bill that might prevent pt return  Pt moved from [facility] to [facility]- unit CSW given verbal handoff- please reconsult if patient is not able to return home with home services and equipment.  Domenica Reamer, [place] Social Worker [phone]

## 2015-11-17 NOTE — Evaluation (Signed)
Physical Therapy Evaluation & discharge Patient Details Name: [name] MRN: [id number] DOB: 09-24-1960 Today's Date: 11/17/2015   History of Present Illness  This 55 y.o. female admitted with AMS, hypotension.  She was found to have UTI/sepsis.  PMH includes MS, DM, h/ sacral decubitus ulcer, CKD, h/o DVT (on Xarelto.  Clinical Impression  Pt from SNF and wants to return home.  If pt goes home, she will need hospital bed with air mattress overlay, wheelchair (pt states she is working on getting a new one), w/c cushion, and hoyer lift.  She would also need aides and training for caregivers as she is at a lower level than when last at home.  Therapy recommends returning to SNF.  No further acute PT needs identified and will sign off at this time.    Follow Up Recommendations SNF;Supervision/Assistance - 24 hour    Equipment Recommendations  Hospital bed;Wheelchair (measurements PT);Wheelchair cushion (measurements PT);Other (comment)    Recommendations for Other Services       Precautions / Restrictions Precautions Precautions: Fall Precaution Comments: air mattress      Mobility  Bed Mobility Overal bed mobility: +2 for physical assistance;+ 2 for safety/equipment;Needs Assistance Bed Mobility: Supine to Sit;Sit to Supine Rolling: Total assist;+2 for physical assistance;+2 for safety/equipment   Supine to sit: Total assist;+2 for physical assistance;+2 for safety/equipment Sit to supine: Total assist;+2 for physical assistance;+2 for safety/equipment   General bed mobility comments: Pt completely dependent on therapist to complete transfer  Transfers                 General transfer comment: pt dependent for transfers since 07/2015. Pt with no active movement noted in BIL LE and states "i have MS" when trying to further assess. pt total (A) To slide BIL LE in the bed for bed mobility  Ambulation/Gait                Stairs            Wheelchair  Mobility    Modified Rankin (Stroke Patients Only)       Balance Overall balance assessment: Needs assistance Sitting-balance support: Feet supported;Single extremity supported Sitting balance-Leahy Scale: Zero Sitting balance - Comments: Pt required A entire time sitting EOB ~ 10 mins and tends to keep head down, but able to hold up when cued.  No righting reaction when support was lessened to challenge her balance Postural control: Posterior lean;Left lateral lean                                   Pertinent Vitals/Pain Pain Assessment: No/denies pain    Home Living Family/patient expects to be discharged to:: Skilled nursing facility                 Additional Comments: pt has not completed standing transfer since prior to 07/2015. Pt with minimal therapy gains at [facility] per patient. Prior to admission stand pivot to w/c, w/c is eletric with standard cushion and is 55 years old Pt reports she is due for a new wheelchair. pt used depends for all voiding and requires return to supine for hygiene. Pt very anxious to return home to spend time with her dog "oreo"    Prior Function Level of Independence: Needs assistance   Gait / Transfers Assistance Needed: Lift used at [facility] to assist pt to [facility]  ADL's / Homemaking Assistance Needed: total (A)  for all LB adls. Pt able to feed herself with L hand but is R hand dominant now with deficits        Hand Dominance   Dominant Hand: Right (unable to functionally use)    Extremity/Trunk Assessment   Upper Extremity Assessment: Defer to OT evaluation RUE Deficits / Details: pt AROM at elbow less than full range. pt unable to place R hand on knee in sitting. pt with limited shoulder flexion with L hand (A)ing. Pt relies on L UE for all task     LUE Deficits / Details: pt is R hand dominant and drives w/c prior to 06/9145 with R hand. Pt asking for L handed w/c now   Lower Extremity Assessment: RLE  deficits/detail;LLE deficits/detail RLE Deficits / Details: flaccid LLE Deficits / Details: flaccid  Cervical / Trunk Assessment: Kyphotic  Communication   Communication: No difficulties  Cognition Arousal/Alertness: Awake/alert Behavior During Therapy: Flat affect Overall Cognitive Status: No family/caregiver present to determine baseline cognitive functioning Area of Impairment: Awareness           Awareness: Emergent   General Comments: Pt with decreased insight into current level compared to the level she was at when last at home,when she was able to sit EOB without A and transfer with boyfriend A without a mechanical lift.    General Comments General comments (skin integrity, edema, etc.): sacral decubiti and on air mattress, B prafo boots on    Exercises        Assessment/Plan    PT Assessment All further PT needs can be met in the next venue of care  PT Diagnosis Generalized weakness   PT Problem List Decreased strength;Decreased activity tolerance;Decreased balance;Decreased mobility;Decreased coordination;Decreased safety awareness  PT Treatment Interventions     PT Goals (Current goals can be found in the Care Plan section) Acute Rehab PT Goals Patient Stated Goal: to go home  PT Goal Formulation: All assessment and education complete, DC therapy    Frequency     Barriers to discharge        Co-evaluation PT/OT/SLP Co-Evaluation/Treatment: Yes Reason for Co-Treatment: Complexity of the patient's impairments (multi-system involvement);For patient/therapist safety PT goals addressed during session: Mobility/safety with mobility;Balance         End of Session   Activity Tolerance: Patient tolerated treatment well Patient left: in bed;with call bell/phone within reach           Time: 1003-1030 PT Time Calculation (min) (ACUTE ONLY): 27 min   Charges:   PT Evaluation $PT Eval Moderate Complexity: 1 Procedure     PT G Codes:         Cornesha Radziewicz LUBECK 11/17/2015, 12:32 PM

## 2015-11-17 NOTE — Clinical Social Work Note (Signed)
Patient would like to go home with home health, and does not want to return to SNF, patient is from [place].  Case manager made aware, CSW continuing to follow in case patient will agree to return to SNF.  Jones Broom. [place], MSW, [place] 11/17/2015 6:14 PM

## 2015-11-17 NOTE — Progress Notes (Signed)
Nutrition Follow-up  DOCUMENTATION CODES:   Non-severe (moderate) malnutrition in context of chronic illness  INTERVENTION:   -Continue Ensure Enlive po BID, each supplement provides 350 kcal and 20 grams of protein -30 ml Prostat BID, each supplement provides 100 kcals and 15 grams protein  NUTRITION DIAGNOSIS:   Malnutrition related to chronic illness as evidenced by moderate depletion of body fat, moderate depletions of muscle mass.  Ongoing  GOAL:   Patient will meet greater than or equal to 90% of their needs  Progressing  MONITOR:   PO intake, Supplement acceptance, Labs, Weight trends, Skin, I & O's  REASON FOR ASSESSMENT:   Consult Assessment of nutrition requirement/status  ASSESSMENT:   55 yo Female with PMH significant of MS, chronic decubitus ulcers, C KD stage III, protein calorie malnutrition, bedbound, chronic UTIs with indwelling Foley catheter presenting from the nursing home after patient was noted to be somnolent and confused.   Pt transferred from SDU to medical floor today.   Reviewed CWOCN note from 11/17/15; pt with small remaining pressure injuries (stage IV, mostly healed) to sacrum, left, and rt ischium. Air mattress already ordered to reduce pressure.   Pt sleeping soundly at time of visit. RD did not wake. Noted Ensure supplement at bedside, which pt had consumed about 75%.   Case discussed with RN. Confirms good appetite (PO: 50-100%). Pt is consuming both Prostat and Ensure supplements well.   RNCM and CSW following. Pt would like to return home instead of returning to SNF.   Labs reviewed: CBGS: 96-190.   Diet Order:  Diet Heart Room service appropriate?: Yes; Fluid consistency:: Thin  Skin:  Wound (see comment) (several superficial dime sized decubitus ulcers on bilat buttocks)  Last BM:  11/15/15  Height:   Ht Readings from Last 1 Encounters:  11/13/15 5\' 6"  (1.676 m)    Weight:   Wt Readings from Last 1 Encounters:  11/16/15  167 lb 1.7 oz (75.8 kg)    Ideal Body Weight:  59 kg  BMI:  Body mass index is 26.98 kg/(m^2).  Estimated Nutritional Needs:   Kcal:  1700-1900  Protein:  90-100 gm  Fluid:  1.7-1.9 L  EDUCATION NEEDS:   No education needs identified at this time  Kase Shughart A. Jimmye Norman, RD, LDN, CDE Pager: [phone] After hours Pager: [phone]

## 2015-11-17 NOTE — Progress Notes (Signed)
Pharmacy Antibiotic Note  [name] is a 55 y.o. female admitted on 11/11/2015 with sepsis.  Pharmacy has been consulted for Septra dosing for Providencia bacteremia and complicated pyelonephritis.  Patient's renal function has been stable and her electrolytes are WNL.  Plan: - Continue Bactrim 1 DS tab PO BID x 14 days total (stop date 6/13) - Pharmacy will sign off as dosage adjustment is likely unnecessary.  Thank you for the consult!   Height: 5\' 6"  (167.6 cm) Weight: 167 lb 1.7 oz (75.8 kg) IBW/kg (Calculated) : 59.3  Temp (24hrs), Avg:98.5 F (36.9 C), Min:97.8 F (36.6 C), Max:99.4 F (37.4 C)   Recent Labs Lab 11/11/15 1444 11/11/15 1446  11/11/15 1627 11/12/15 0400 11/12/15 0809 11/13/15 0544 11/13/15 2115 11/14/15 0325 11/15/15 0632 11/16/15 0334 11/17/15 0709  WBC  --   --   --   --  16.3*  --  18.4*  --  13.2* 11.9* 11.3* 11.2*  CREATININE  --   --   < >  --  0.82  --  0.62 0.71 0.67 0.61 0.61 0.54  LATICACIDVEN 5.42* 4.8*  --  5.5* 2.5* 1.3  --   --   --   --   --   --   < > = values in this interval not displayed.  Estimated Creatinine Clearance: 80.7 mL/min (by C-G formula based on Cr of 0.54).    No Known Allergies  Antimicrobials this admission: Vanc 5/31 >> 6/4 Zosyn 5/31 >> 6/4 Bactrim 6/4 >>  Dose adjustments this admission: N/A  Microbiology results: 5/31 blood cx > 1/2 CoNS and Providencia (R to ampicillin / cefazolin / cipro / gent) 5/31 urine cx > multiple species present 6/2 urine cx > multiple species present MRSA PCR > negative   [name] D. Mina Marble, PharmD, BCPS Pager:  [phone] 11/17/2015, 11:32 AM

## 2015-11-17 NOTE — Consult Note (Signed)
WOC wound consult note Reason for Consult: Consult requested for sacrum and bilat ischium.  Pt is familiar to [facility] team from previous admissions, when she had stage 4 pressure injuries.  These are mostly healed with pink dry scar tissue. Wound type: Small remaining pressure injury in the center of each location; to sacrum, left and right ischium.  Each site is approx .1X.1X.1cm, pink and dry. Pressure Ulcer POA: Yes Dressing procedure/placement/frequency: Foam dressings to protect and promote healing.  Pt is frequently incontinent of stool and it will be difficult to keep wounds from becoming soiled.  Air mattress has already been ordered to reduce pressure.  Please re-consult if further assistance is needed.  Thank-you,  Julien Girt MSN, [place], [place], [place], [place]

## 2015-11-17 NOTE — Evaluation (Signed)
Occupational Therapy Evaluation Patient Details Name: [name] MRN: [id number] DOB: [birth year] Today's Date: 11/17/2015    History of Present Illness This 55 y.o. female admitted with AMS, hypotension.  She was found to have UTI/sepsis.  PMH includes MS, DM, h/ sacral decubitus ulcer, CKD, h/o DVT (on Xarelto.   Clinical Impression   Patient evaluated by Occupational Therapy with no further acute OT needs identified. All education has been completed and the patient has no further questions. See below for any follow-up Occupational Therapy or equipment needs. OT to sign off. Thank you for referral.      Follow Up Recommendations  SNF;Supervision/Assistance - 24 hour    Equipment Recommendations  Hospital bed;Wheelchair cushion (measurements OT);Wheelchair (measurements OT) (air mattress overlay ( see seperate addendum note))    Recommendations for Other Services       Precautions / Restrictions Precautions Precautions: Fall      Mobility Bed Mobility Overal bed mobility: +2 for physical assistance;+ 2 for safety/equipment;Needs Assistance Bed Mobility: Supine to Sit;Sit to Supine Rolling: Total assist;+2 for physical assistance;+2 for safety/equipment   Supine to sit: Total assist;+2 for physical assistance;+2 for safety/equipment Sit to supine: Total assist;+2 for physical assistance;+2 for safety/equipment   General bed mobility comments: Pt completely dependent on therapist to complete transfer  Transfers                 General transfer comment: pt dependent for transfers since 07/2015. Pt with no active movement noted in BIL LE and states "i have MS" when trying to further assess. pt total (A) To slide BIL LE in the bed for bed mobility    Balance Overall balance assessment: Needs assistance Sitting-balance support: Single extremity supported;Feet supported Sitting balance-Leahy Scale: Zero                                      ADL  Overall ADL's : Needs assistance/impaired                                       General ADL Comments: Pt required (A) to change the channel on remote to a movie. pt required total +2 to sit EOB. pt tolerated EOB sitting for 10 minutes. Pt unabel to sustain static sitting with L UE on bed rail. Pt verbalized " i am falling" without any righting reaction. Pt with sacral wound and describes surfaces at home as "firm"     Vision     Perception     Praxis      Pertinent Vitals/Pain Pain Assessment: No/denies pain     Hand Dominance Right (unable to functionally use)   Extremity/Trunk Assessment Upper Extremity Assessment Upper Extremity Assessment: RUE deficits/detail RUE Deficits / Details: pt AROM at elbow less than full range. pt unable to place R hand on knee in sitting. pt with limited shoulder flexion with L hand (A)ing. Pt relies on L UE for all task LUE Deficits / Details: pt is R hand dominant and drives w/c prior to [id number] with R hand. Pt asking for L handed w/c now   Lower Extremity Assessment Lower Extremity Assessment: Defer to PT evaluation;RLE deficits/detail;LLE deficits/detail RLE Deficits / Details: flaccid LLE Deficits / Details: flaccid   Cervical / Trunk Assessment Cervical / Trunk Assessment: Kyphotic   Communication Communication Communication: No difficulties  Cognition Arousal/Alertness: Awake/alert Behavior During Therapy: Flat affect Overall Cognitive Status: Impaired/Different from baseline Area of Impairment: Awareness           Awareness: Emergent   General Comments: Pt describes return home as getting out of bed and going to look at trees in her yard. pt unaware of ability to sit EOB without total (A). pt when cued to this fact states "I have MS. Do you know how frustrating it is when you ask me these questions after I told you I have MS." pt lacks insight to deficits and these deficits affecting return home safely.    General Comments       Exercises       Shoulder Instructions      Home Living Family/patient expects to be discharged to:: Skilled nursing facility                                 Additional Comments: pt has not completed standing transfer since prior to 07/2015. Pt with minimal therapy gains at [facility] per patient. Prior to admission stand pivot to w/c, w/c is eletric with standard cushion and is 55 years old Pt reports she is due for a new wheelchair. pt used dependents for all voiding and requires return to supine for hygiene. Pt very anxious to return home to spend time with her dog "oreo"      Prior Functioning/Environment Level of Independence: Needs assistance  Gait / Transfers Assistance Needed: Lift used at [facility] to assist pt to [facility] ADL's / Homemaking Assistance Needed: total (A) for all LB adls. Pt able to feed herself with L hand but is R hand dominant now with deficits        OT Diagnosis: Generalized weakness;Cognitive deficits   OT Problem List:     OT Treatment/Interventions:      OT Goals(Current goals can be found in the care plan section) Acute Rehab OT Goals Patient Stated Goal: to return home to her dog OReo OT Goal Formulation: All assessment and education complete, DC therapy  OT Frequency:     Barriers to D/C:            Co-evaluation              End of Session Nurse Communication: Mobility status;Precautions;Need for lift equipment  Activity Tolerance: Patient tolerated treatment well Patient left: in bed;with call bell/phone within reach;with bed alarm set   Time: 1003-1030 OT Time Calculation (min): 27 min Charges:  OT General Charges $OT Visit: 1 Procedure OT Evaluation $OT Eval Moderate Complexity: 1 Procedure G-Codes:    Parke Poisson B 12-14-2015, 10:54 AM   Jeri Modena   OTR/L Pager: [phone] Office: [phone] .

## 2015-11-17 NOTE — Care Management Note (Signed)
Case Management Note  Patient Details  Name: AMIYHA BUMPUS MRN: [id number] Date of Birth: May 15, 1961  Subjective/Objective:                    Action/Plan: Patient wants to return home at discharge . Choice offered . Patient would like [place] .  Referral given to [place] . [facility] reviewing case and has concerns regarding patient being home alone and bed / wheelchair bound.   Expected Discharge Date:  11/18/15               Expected Discharge Plan:  Skilled Nursing Facility  In-House Referral:  Clinical Social Work  Discharge planning Services  CM Consult  Post Acute Care Choice:    Choice offered to:     DME Arranged:    DME Agency:     HH Arranged:    [place] Agency:     Status of Service:  Completed, signed off  Medicare Important Message Given:  Yes Date Medicare IM Given:    Medicare IM give by:    Date Additional Medicare IM Given:    Additional Medicare Important Message give by:     If discussed at [place] of Stay Meetings, dates discussed:    Additional Comments:  Marilu Favre, RN 11/17/2015, 3:59 PM

## 2015-11-17 NOTE — Progress Notes (Signed)
PROGRESS NOTE    [name]  [id number] DOB: 1960-11-14 DOA: 11/11/2015 PCP: Gildardo Cranker, DO   Brief Narrative:  55 y.o. BF PMHx  MS, Chronic Decubitus ulcers, C KD stage III, Protein Calorie Malnutrition, Bedbound, Chronic UTIs with indwelling Foley catheter, DM type II with complication, DVT   presenting from the nursing home after patient was noted to be somnolent and confused.   Level V caveat applies as patient is unable to provide reliable history. Patient is alert and oriented to person and place only. Answers are somewhat coherent but at other times cannot is fairly gibberish. History provided by EDP, nursing home report, and family members via phone conversation. At 7:15 this morning patient was noted by nursing home staff to be confused and to have a blood pressure of 71/47 with pulse of 71, temperature 99.3 and respirations 16 with 95% O2 sats. Per report patient had vomited a couple of times a day before but was otherwise in her normal state of health. Patient was treated with Phenergan after the emesis with resolution. Currently patient has no complaints. Prior to this event patient was in her normal state of health. EMS was contacted, peripheral IV and fluids were started, and patient was transported to [facility] ED. Per report patient is normally completely alert and oriented.   Assessment & Plan:   Active Problems:   Multiple sclerosis diagnosed 2002-not on Therapy any longer   Acute encephalopathy   Diabetes mellitus type 2, controlled ([place])   UTI (lower urinary tract infection)   Sepsis (HCC)   Lower extremity edema   Chronic indwelling Foley catheter   AKI (acute kidney injury) ([place])   Neurogenic bladder   Protein calorie malnutrition (HCC)   Physical deconditioning   HLD (hyperlipidemia)   Sepsis, unspecified organism ([place])   Protein-calorie malnutrition ([place])   Decubitus ulcer   Controlled diabetes mellitus type 2 with complications ([place])   Acute  pyelonephritis   Sepsis unspecified organism/ Complicated Pyelonephritis:  -Most likely from chronic indwelling catheter -positive PROVIDENCIA STUARTII & GPC most likely coag negative staph -Bactrim complete 14 days anabiotic's     Acute renal failure (baseline Cr~ 0.9.) Lab Results  Component Value Date   CREATININE 0.54 11/17/2015   CREATININE 0.61 11/16/2015   CREATININE 0.61 11/15/2015  -Resolved with hydration  Neurogenic bladder with Chronic indwelling Foley catheter.  -This is been the source of the patient's last 2 admissions. Foley changed at time of admission (per patient~1 week ago). - Discharge patient on chronic prophylactic UTI antibiotics - Continue Vesicare and Foley catheter -Ensure follow-up with Urologist as outpatient to monitor patient's progress; states has urologist which she sees.  Multiple sclerosis -Not on medication secondary to being scared of needles. Resulted in her becoming bedbound/mobility with electric chair. -At discharge refer to a new neurologist; any medication that would improve quality of life?  Protein calorie malnutrition/physical deconditioning:  -Multifactorial to include chronic MS (No MS medications). Bedbound. - Pro stat feeding supplement; Ensure feeding supplement - Continue baclofen for contractures  Several superficial dime sized decubitus ulcers on bilat buttocks." -Per ED note  - PT/OT; Recommends SNF; patient refuses - Discharge home with hospital bed and general mattress, with alternating, and Hoyer lift  - wound care  DM type II controlled with complication:  -[id number] hemoglobin A1c= 5.9 - Hold metformin - Sensitive SSI  DVT: - continue xarelto  Dependent edema: - Echocardiogram; EF normal - Hold Lasix; no heart failure.  HLD: - Continue statin]  Constipation -KUB (also complaining of Abd pain); moderate stool.  -Resolved with  magnesium citrate + soapsuds enema -Continue MiraLAX BID  Acute blood loss  anemia? -Repeat H/H; 6.3 -6/3 Transfuse 2 units PRBC -Occult blood card negative -Transfuse for hemoglobin<8    Goals of care -Talked with patient extensively about the difference between DO NOT RESUSCITATE and full code, and after answering patient's questions she decided to be made DO NOT RESUSCITATE   DVT prophylaxis: Eliquis Code Status: DO NOT RESUSCITATE Family Communication: None available Disposition Plan: Discharge on 6/7   Consultants:  None  Procedures/Significant Events:  5/31 KUB;moderate amount of stool in the colon. Gas is present in nondilated loops of small bowel without evidence of obstruction. 5/31 PCXR;-Retro diaphragmatic density on the left with mild blunting of the left lateral costophrenic angle, potentially from left pleural effusion and/or left lower lobe atelectasis/pneumonia. Stable. 6/2 echocardiogram;LVEF=  60%- 65%.    Cultures 5/31 blood right hand positive PROVIDENCIA STUARTII; GPC; reincubated for better growth 5/31 blood right AC NGTD 5/31 urine positive multiple species 5/31 MRSA by PCR negative 6/2 urine  positive multiple species   Antimicrobials: Zosyn 5/31>> 6/4 Vancomycin 5/31>> 6/4 Bactrim 6/4>>    Devices    LINES / TUBES:  -Chronic Foley    Continuous Infusions: . sodium chloride 50 mL/hr at 11/16/15 1803     Subjective: 6/6  Afebrile last 24 hours. A/O 4, NAD, refuses SNF.    Objective: Filed Vitals:   11/16/15 1800 11/16/15 1930 11/16/15 2149 11/17/15 0548  BP: 131/70 136/73 149/78 132/69  Pulse: 86 88 83 92  Temp:  98.4 F (36.9 C) 99.4 F (37.4 C) 98.1 F (36.7 C)  TempSrc:  Oral Oral Oral  Resp: 15 18  19   Height:      Weight:      SpO2: 96% 95% 98% 99%    Intake/Output Summary (Last 24 hours) at 11/17/15 0859 Last data filed at 11/16/15 2202  Gross per 24 hour  Intake    725 ml  Output   2551 ml  Net  -1826 ml   Filed Weights   11/11/15 1011 11/13/15 0500 11/16/15 0530  Weight:  59.875 kg (132 lb) 69 kg (152 lb 1.9 oz) 75.8 kg (167 lb 1.7 oz)    Examination:  General: A/O 4, NAD, No acute respiratory distress Eyes: negative scleral hemorrhage, negative anisocoria, negative icterus ENT: Negative Runny nose, negative gingival bleeding, Neck:  Negative scars, masses, torticollis, lymphadenopathy, JVD Lungs: Clear to auscultation bilaterally without wheezes or crackles Cardiovascular: Regular  rhythm And rate without murmur gallop or rub normal S1 and S2 Abdomen: Negative suprapubic abdominal pain to palpation, nondistended, positive soft, bowel sounds, no rebound, no ascites, no appreciable mass, negative bilateral CVA tenderness  Extremities: No significant cyanosis, clubbing, or edema bilateral lower extremities Skin: Negative rashes, lesions, ulcers Psychiatric:  Negative depression, negative anxiety, negative fatigue, negative mania  Central nervous system:  Cranial nerves II through XII intact, tongue/uvula midline, all extremities muscle strength 5/5, sensation intact throughout, negative dysarthria, negative expressive aphasia, negative receptive aphasia.  .     Data Reviewed: Care during the described time interval was provided by me .  I have reviewed this patient's available data, including medical history, events of note, physical examination, and all test results as part of my evaluation. I have personally reviewed and interpreted all radiology studies.  CBC:  Recent Labs Lab 11/11/15 [id number]  11/13/15 0544 11/14/15 0325 11/14/15 1709 11/15/15 [id number] 11/16/15 [id number]  11/17/15 0709  WBC 26.8*  < > 18.4* 13.2*  --  11.9* 11.3* 11.2*  NEUTROABS 24.4*  --  15.4* 9.6*  --  7.4 6.3  --   HGB 9.4*  < > 8.8* 6.2* 6.3* 8.7* 8.6* 8.7*  HCT 29.7*  < > 28.5* 20.0* 20.3* 27.2* 27.1* 27.9*  MCV 86.8  < > 87.4 85.5  --  82.4 82.9 82.8  PLT 478*  < > 381 355  --  307 320 384  < > = values in this interval not displayed. Basic Metabolic Panel:  Recent Labs Lab  11/11/15 1447  11/12/15 2334 11/13/15 0544 11/13/15 2115 11/14/15 0325 11/15/15 [id number] 11/16/15 0334 11/17/15 0709  NA  --   < >  --  142 142 144 143 141 140  K  --   < >  --  3.4* 3.4* 3.6 3.3* 4.2 3.7  CL  --   < >  --  115* 115* 115* 110 112* 110  CO2  --   < >  --  20* 22 25 25 24 25   GLUCOSE  --   < >  --  123* 211* 151* 121* 108* 93  BUN  --   < >  --  16 14 15 16 15 13   CREATININE 1.27*  < >  --  0.62 0.71 0.67 0.61 0.61 0.54  CALCIUM  --   < >  --  9.8 9.5 9.5 9.6 9.2 9.5  MG 1.7  --   --  1.7  --  1.7 1.9 1.7 1.8  PHOS 4.3  --  1.8*  --   --   --   --   --  3.2  < > = values in this interval not displayed. GFR: Estimated Creatinine Clearance: 80.7 mL/min (by C-G formula based on Cr of 0.54). Liver Function Tests:  Recent Labs Lab 11/13/15 0544 11/14/15 0325 11/15/15 0632 11/16/15 0334 11/17/15 0709  AST 19 19 18  11* 9*  ALT 18 23 25 19 15   ALKPHOS 77 70 82 75 62  BILITOT 0.5 0.3 1.0 0.6 0.4  PROT 6.9 5.8* 5.6* 5.8* 6.0*  ALBUMIN 2.3* 1.7* 1.7* 1.9* 2.0*   No results for input(s): LIPASE, AMYLASE in the last 168 hours. No results for input(s): AMMONIA in the last 168 hours. Coagulation Profile:  Recent Labs Lab 11/11/15 1447  INR 1.86*   Cardiac Enzymes:  Recent Labs Lab 11/11/15 1627  TROPONINI <0.03   BNP (last 3 results) No results for input(s): PROBNP in the last 8760 hours. HbA1C: No results for input(s): HGBA1C in the last 72 hours. CBG:  Recent Labs Lab 11/15/15 2212 11/16/15 1000 11/16/15 1315 11/16/15 1757 11/16/15 2207  GLUCAP 170* 113* 174* 190* 153*   Lipid Profile: No results for input(s): CHOL, HDL, LDLCALC, TRIG, CHOLHDL, LDLDIRECT in the last 72 hours. Thyroid Function Tests: No results for input(s): TSH, T4TOTAL, FREET4, T3FREE, THYROIDAB in the last 72 hours. Anemia Panel:  Recent Labs  11/17/15 0709  RETICCTPCT 1.0   Urine analysis:    Component Value Date/Time   COLORURINE YELLOW 11/11/2015 1011    APPEARANCEUR TURBID* 11/11/2015 1011   LABSPEC 1.017 11/11/2015 1011   PHURINE 7.0 11/11/2015 1011   GLUCOSEU NEGATIVE 11/11/2015 1011   HGBUR LARGE* 11/11/2015 [place] 11/11/2015 [place] 11/11/2015 1011   PROTEINUR 30* 11/11/2015 1011   UROBILINOGEN 0.2 12/20/2014 1851   NITRITE POSITIVE* 11/11/2015 1011   LEUKOCYTESUR LARGE*  11/11/2015 1011   Sepsis Labs: @LABRCNTIP (procalcitonin:4,lacticidven:4)  ) Recent Results (from the past 240 hour(s))  Blood Culture (routine x 2)     Status: Abnormal   Collection Time: 11/11/15  9:00 AM  Result Value Ref Range Status   Specimen Description BLOOD RIGHT HAND  Final   Special Requests BOTTLES DRAWN AEROBIC AND ANAEROBIC 5CC  Final   Culture  Setup Time   Final    GRAM NEGATIVE RODS GRAM POSITIVE COCCI AEROBIC BOTTLE ONLY CRITICAL RESULT CALLED TO, READ BACK BY AND VERIFIED WITH: C. STEWART, PHARM D AT 0916 ON [id number] BY SKY    Culture (A)  Final    PROVIDENCIA STUARTII STAPHYLOCOCCUS SPECIES (COAGULASE NEGATIVE) THE SIGNIFICANCE OF ISOLATING THIS ORGANISM FROM A SINGLE SET OF BLOOD CULTURES WHEN MULTIPLE SETS ARE DRAWN IS UNCERTAIN. PLEASE NOTIFY THE MICROBIOLOGY DEPARTMENT WITHIN ONE WEEK IF SPECIATION AND SENSITIVITIES ARE REQUIRED.    Report Status 11/16/2015 FINAL  Final   Organism ID, Bacteria PROVIDENCIA STUARTII  Final      Susceptibility   Providencia stuartii - MIC*    AMPICILLIN 16 RESISTANT Resistant     CEFAZOLIN >=64 RESISTANT Resistant     CEFEPIME <=1 SENSITIVE Sensitive     CEFTAZIDIME <=1 SENSITIVE Sensitive     CEFTRIAXONE <=1 SENSITIVE Sensitive     CIPROFLOXACIN >=4 RESISTANT Resistant     GENTAMICIN 8 RESISTANT Resistant     IMIPENEM 1 SENSITIVE Sensitive     TRIMETH/SULFA <=20 SENSITIVE Sensitive     AMPICILLIN/SULBACTAM 4 SENSITIVE Sensitive     PIP/TAZO <=4 SENSITIVE Sensitive     * PROVIDENCIA STUARTII  Blood Culture ID Panel (Reflexed)     Status: Abnormal    Collection Time: 11/11/15  9:00 AM  Result Value Ref Range Status   Enterococcus species NOT DETECTED NOT DETECTED Final   Vancomycin resistance NOT DETECTED NOT DETECTED Final   Listeria monocytogenes NOT DETECTED NOT DETECTED Final   Staphylococcus species DETECTED (A) NOT DETECTED Final    Comment: CRITICAL RESULT CALLED TO, READ BACK BY AND VERIFIED WITH: C. STEWART, PHARM D AT 0916 ON [id number] BY S. YARBROUGH    Staphylococcus aureus NOT DETECTED NOT DETECTED Final   Methicillin resistance DETECTED (A) NOT DETECTED Final    Comment: CRITICAL RESULT CALLED TO, READ BACK BY AND VERIFIED WITH: C. STEWART, PHARM D AT 0916 ON [id number] BY S. YARBROUGH    Streptococcus species NOT DETECTED NOT DETECTED Final   Streptococcus agalactiae NOT DETECTED NOT DETECTED Final   Streptococcus pneumoniae NOT DETECTED NOT DETECTED Final   Streptococcus pyogenes NOT DETECTED NOT DETECTED Final   Acinetobacter baumannii NOT DETECTED NOT DETECTED Final   Enterobacteriaceae species NOT DETECTED NOT DETECTED Final   Enterobacter cloacae complex NOT DETECTED NOT DETECTED Final   Escherichia coli NOT DETECTED NOT DETECTED Final   Klebsiella oxytoca NOT DETECTED NOT DETECTED Final   Klebsiella pneumoniae NOT DETECTED NOT DETECTED Final   Proteus species NOT DETECTED NOT DETECTED Final   Serratia marcescens NOT DETECTED NOT DETECTED Final   Carbapenem resistance NOT DETECTED NOT DETECTED Final   Haemophilus influenzae NOT DETECTED NOT DETECTED Final   Neisseria meningitidis NOT DETECTED NOT DETECTED Final   Pseudomonas aeruginosa NOT DETECTED NOT DETECTED Final   Candida albicans NOT DETECTED NOT DETECTED Final   Candida glabrata NOT DETECTED NOT DETECTED Final   Candida krusei NOT DETECTED NOT DETECTED Final   Candida parapsilosis NOT DETECTED NOT DETECTED Final   Candida tropicalis NOT DETECTED NOT DETECTED  Final  Blood Culture (routine x 2)     Status: None   Collection Time: 11/11/15  9:10 AM  Result  Value Ref Range Status   Specimen Description BLOOD RIGHT ANTECUBITAL  Final   Special Requests   Final    BOTTLES DRAWN AEROBIC AND ANAEROBIC 10CC AER 5CC ANA   Culture NO GROWTH 5 DAYS  Final   Report Status 11/16/2015 FINAL  Final  Urine culture     Status: Abnormal   Collection Time: 11/11/15 10:11 AM  Result Value Ref Range Status   Specimen Description URINE, RANDOM  Final   Special Requests NONE  Final   Culture MULTIPLE SPECIES PRESENT, SUGGEST RECOLLECTION (A)  Final   Report Status 11/12/2015 FINAL  Final  MRSA PCR Screening     Status: None   Collection Time: 11/11/15  4:21 PM  Result Value Ref Range Status   MRSA by PCR NEGATIVE NEGATIVE Final    Comment:        The GeneXpert MRSA Assay (FDA approved for NASAL specimens only), is one component of a comprehensive MRSA colonization surveillance program. It is not intended to diagnose MRSA infection nor to guide or monitor treatment for MRSA infections.   Culture, Urine     Status: Abnormal   Collection Time: 11/13/15 12:50 PM  Result Value Ref Range Status   Specimen Description URINE, CATHETERIZED  Final   Special Requests NONE  Final   Culture MULTIPLE SPECIES PRESENT, SUGGEST RECOLLECTION (A)  Final   Report Status 11/14/2015 FINAL  Final         Radiology Studies: No results found.      Scheduled Meds: . baclofen  10 mg Oral TID  . darifenacin  7.5 mg Oral Daily  . feeding supplement (ENSURE ENLIVE)  237 mL Oral BID BM  . feeding supplement (PRO-STAT SUGAR FREE 64)  30 mL Oral BID  . gabapentin  100 mg Oral QHS  . insulin aspart  0-5 Units Subcutaneous QHS  . insulin aspart  0-9 Units Subcutaneous TID WC  . polyethylene glycol  17 g Oral BID  . pravastatin  20 mg Oral q1800  . rivaroxaban  20 mg Oral Q supper  . sodium chloride flush  3 mL Intravenous Q12H  . sulfamethoxazole-trimethoprim  1 tablet Oral Q12H   Continuous Infusions: . sodium chloride 50 mL/hr at 11/16/15 1803     LOS: 6  days    Time spent: 40 minutes     WOODS, Geraldo Docker, MD [facility] Pager [phone]   If 7PM-7AM, please contact night-coverage [email or web address] Password [email or web address] 11/17/2015, 8:59 AM

## 2015-11-17 NOTE — Progress Notes (Signed)
OT NOTE  Therapy recommends return to SNF. IF patient decides to d/c home   Equipment needs: 1. Air mattress overlay 2. Wheelchair with cushions due to sacral wound 3. Hoyer lift 4. Nurse and Aide for adls 5. Hospital bed ( pt uses a normal bed at home)  Pt is not safe to d/c home at this time from therapy stand point.  Formal evaluation to follow  Jeri Modena   OTR/L Pager: [phone] Office: [phone] .

## 2015-11-18 DIAGNOSIS — Z9289 Personal history of other medical treatment: Secondary | ICD-10-CM

## 2015-11-18 DIAGNOSIS — T83511D Infection and inflammatory reaction due to indwelling urethral catheter, subsequent encounter: Secondary | ICD-10-CM

## 2015-11-18 DIAGNOSIS — N390 Urinary tract infection, site not specified: Secondary | ICD-10-CM

## 2015-11-18 DIAGNOSIS — A4151 Sepsis due to Escherichia coli [E. coli]: Secondary | ICD-10-CM

## 2015-11-18 LAB — GLUCOSE, CAPILLARY
GLUCOSE-CAPILLARY: 92 mg/dL (ref 65–99)
GLUCOSE-CAPILLARY: 211 mg/dL — AB (ref 65–99)
GLUCOSE-CAPILLARY: 168 mg/dL — AB (ref 65–99)
GLUCOSE-CAPILLARY: 149 mg/dL — AB (ref 65–99)

## 2015-11-18 MED ORDER — GABAPENTIN 100 MG PO CAPS: 100.0000 mg | ORAL_CAPSULE | Freq: Every day | ORAL | Status: AC

## 2015-11-18 MED ORDER — FUROSEMIDE 40 MG PO TABS: 40.0000 mg | ORAL_TABLET | Freq: Every day | ORAL | Status: DC | PRN

## 2015-11-18 MED ORDER — METFORMIN HCL 500 MG PO TABS: 500.0000 mg | ORAL_TABLET | Freq: Two times a day (BID) | ORAL | Status: DC

## 2015-11-18 MED ORDER — PRAVASTATIN SODIUM 20 MG PO TABS: 20.0000 mg | ORAL_TABLET | Freq: Every day | ORAL | Status: DC

## 2015-11-18 MED ORDER — BACLOFEN 10 MG PO TABS: 10.0000 mg | ORAL_TABLET | Freq: Three times a day (TID) | ORAL | Status: DC

## 2015-11-18 MED ORDER — POLYETHYLENE GLYCOL 3350 17 G PO PACK: 17.0000 g | PACK | Freq: Two times a day (BID) | ORAL | Status: AC

## 2015-11-18 MED ORDER — FAMOTIDINE 20 MG PO TABS: 20.0000 mg | ORAL_TABLET | Freq: Two times a day (BID) | ORAL | Status: AC

## 2015-11-18 MED ORDER — RIVAROXABAN 20 MG PO TABS: 20.0000 mg | ORAL_TABLET | Freq: Every day | ORAL | Status: DC

## 2015-11-18 MED ORDER — PRO-STAT SUGAR FREE PO LIQD: 30.0000 mL | Freq: Two times a day (BID) | ORAL | Status: DC

## 2015-11-18 MED ORDER — SULFAMETHOXAZOLE-TRIMETHOPRIM 800-160 MG PO TABS: 1.0000 | ORAL_TABLET | Freq: Two times a day (BID) | ORAL | Status: DC

## 2015-11-18 MED ORDER — HYDROCODONE-ACETAMINOPHEN 5-325 MG PO TABS: 1.0000 | ORAL_TABLET | Freq: Four times a day (QID) | ORAL | Status: DC | PRN

## 2015-11-18 MED ORDER — SOLIFENACIN SUCCINATE 5 MG PO TABS: 5.0000 mg | ORAL_TABLET | Freq: Every day | ORAL | Status: DC

## 2015-11-18 NOTE — Discharge Summary (Signed)
DISCHARGE SUMMARY  EZLYNN COBO  MR#: [id number]  DOB:26-Nov-1960  Date of Admission: 11/11/2015 Date of Discharge: 11/18/2015  Attending Physician:Bralee Feldt T  Patient's TT:[id number], Brayton Layman, DO  Consults:  none  Disposition: D/C home   Follow-up Appts:     Follow-up Information    Follow up with Gildardo Cranker, DO. Schedule an appointment as soon as possible for a visit in 1 week.   Specialty:  Internal Medicine   Contact information:   [place] [id number] [phone]      Tests Needing Follow-up: -assess control of DM -consider referral to Neurology to consider tx of MS -consider referral to Urology to follow chronic foley catheter  Discharge Diagnoses: Sepsis due to Providencia and Coag Neg Staph Bacteremia and Pyelonephritis due to indwelling foley cath Acute renal failure Anemia Neurogenic bladder with Chronic indwelling Foley catheter Multiple sclerosis Moderate Protein calorie malnutrition Several superficial dime sized decubitus ulcers on bilat buttocks DM type II controlled with complication DVT Dependent edema HLD Hypokalemia Hypophosphatemia Constipation  Initial presentation: 55 y.o. F Hx MS, Chronic Decubitus ulcers, CKD stage III, Protein Calorie Malnutrition, Bedbound, Chronic UTIs with indwelling Foley catheter, DM2, and DVT who presented from her nursing home somnolent and confused w/ a BP of 71/47.   Hospital Course:  Sepsis due to Providencia and Coag Neg Staph Bacteremia and Pyelonephritis  -Most likely from chronic indwelling catheter -blood cx positive for PROVIDENCIA STUARTII & coag negative staph (1 of 2)  -Bactrim to complete 14 days of total tx - clinically much improved   Acute renal failure -baseline Cr~ 0.9 -resolved with hydration  Anemia -transfused 2 units PRBC this admit  -occult blood negative -likely chronic anemia w/ "drop" due to hydration in a DH pt - Fe studies note adequate Fe stores     Neurogenic bladder with Chronic indwelling Foley catheter -this has been the source of the patient's last 2 admissions -follow-up with her Urologist as outpatient   Multiple sclerosis -not on medication secondary to being scared of needles - resulted in her becoming bedbound -if desires new Neurologist, this will need to be coordinated as an outpt through her PCP   Moderate Protein calorie malnutrition -Nutrition consult  Several superficial dime sized decubitus ulcers on bilat buttocks -Air overlay - WOC eval notes 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 w/ pink dry scar tissue w/ "small remaining pressure injury in the center of each location; to sacrum, left and right ischium, each site is approx .1X.1X.1cm, pink and dry" - air mattress was suggested, andlong w/ foam dressing   DM type II controlled with complication -[id number] 5.9 - CBG reasonably controlled at time of d/c   DVT -continue xarelto  Dependent edema -EF 60-65% on TTE w/ no WMA w/ no DD  HLD -Continue statin  Hypokalemia -corrected   Hypophosphatemia -corrected   Constipation -Resolved with magnesium citrate + soapsuds enema - continue MiraLAX BID    Medication List    STOP taking these medications        AMBULATORY NON FORMULARY MEDICATION     lovastatin 20 MG tablet  Commonly known as:  MEVACOR  Replaced by:  pravastatin 20 MG tablet     promethazine 25 MG tablet  Commonly known as:  PHENERGAN      TAKE these medications        albuterol 108 (90 Base) MCG/ACT inhaler  Commonly known as:  PROVENTIL HFA;VENTOLIN HFA  Inhale 2 puffs into the lungs  every 4 (four) hours as needed for wheezing or shortness of breath.     APAP 325 MG tablet  Take 650 mg by mouth every 6 (six) hours as needed for pain.     baclofen 10 MG tablet  Commonly known as:  LIORESAL  Take 1 tablet (10 mg total) by mouth 3 (three) times daily.     famotidine 20 MG tablet  Commonly known as:  PEPCID  Take 1  tablet (20 mg total) by mouth 2 (two) times daily.     feeding supplement (PRO-STAT SUGAR FREE 64) Liqd  Take 30 mLs by mouth 2 (two) times daily.     furosemide 40 MG tablet  Commonly known as:  LASIX  Take 1 tablet (40 mg total) by mouth daily as needed for fluid or edema. Reported on 07/05/2015     gabapentin 100 MG capsule  Commonly known as:  NEURONTIN  Take 1 capsule (100 mg total) by mouth at bedtime.     HYDROcodone-acetaminophen 5-325 MG tablet  Commonly known as:  NORCO  Take 1 tablet by mouth every 6 (six) hours as needed for moderate pain.     metFORMIN 500 MG tablet  Commonly known as:  GLUCOPHAGE  Take 1 tablet (500 mg total) by mouth 2 (two) times daily.     multivitamin with minerals Tabs tablet  Take 1 tablet by mouth daily.     Omega 3 1000 MG Caps  Take 1,000 mg by mouth daily.     polyethylene glycol packet  Commonly known as:  MIRALAX / GLYCOLAX  Take 17 g by mouth 2 (two) times daily.     pravastatin 20 MG tablet  Commonly known as:  PRAVACHOL  Take 1 tablet (20 mg total) by mouth daily at 6 PM.     rivaroxaban 20 MG Tabs tablet  Commonly known as:  XARELTO  Take 1 tablet (20 mg total) by mouth daily with supper. Reported on 08/12/2015     solifenacin 5 MG tablet  Commonly known as:  VESICARE  Take 1 tablet (5 mg total) by mouth daily.     sulfamethoxazole-trimethoprim 800-160 MG tablet  Commonly known as:  BACTRIM DS,SEPTRA DS  Take 1 tablet by mouth every 12 (twelve) hours.     vitamin C 500 MG tablet  Commonly known as:  ASCORBIC ACID  Take 500 mg by mouth daily.        Day of Discharge BP 125/69 mmHg  Pulse 78  Temp(Src) 99 F (37.2 C) (Oral)  Resp 20  Ht 5\' 6"  (1.676 m)  Wt 75.8 kg (167 lb 1.7 oz)  BMI 26.98 kg/m2  SpO2 98%  Physical Exam: General: No acute respiratory distress - alert and oriented  Lungs: Clear to auscultation bilaterally without wheezes or crackles Cardiovascular: Regular rate and rhythm without murmur  gallop or rub normal S1 and S2 Abdomen: Nontender, nondistended, soft, bowel sounds positive, no rebound, no ascites, no appreciable mass Extremities: No significant cyanosis, clubbing, or edema bilateral lower extremities  Basic Metabolic Panel:  Recent Labs Lab 11/12/15 2334 11/13/15 0544 11/13/15 2115 11/14/15 0325 11/15/15 0632 11/16/15 0334 11/17/15 0709  NA  --  142 142 144 143 141 140  K  --  3.4* 3.4* 3.6 3.3* 4.2 3.7  CL  --  115* 115* 115* 110 112* 110  CO2  --  20* 22 25 25 24 25   GLUCOSE  --  123* 211* 151* 121* 108* 93  BUN  --  16 14 15 16 15 13   CREATININE  --  0.62 0.71 0.67 0.61 0.61 0.54  CALCIUM  --  9.8 9.5 9.5 9.6 9.2 9.5  MG  --  1.7  --  1.7 1.9 1.7 1.8  PHOS 1.8*  --   --   --   --   --  3.2    Liver Function Tests:  Recent Labs Lab 11/13/15 0544 11/14/15 0325 11/15/15 0632 11/16/15 0334 11/17/15 0709  AST 19 19 18  11* 9*  ALT 18 23 25 19 15   ALKPHOS 77 70 82 75 62  BILITOT 0.5 0.3 1.0 0.6 0.4  PROT 6.9 5.8* 5.6* 5.8* 6.0*  ALBUMIN 2.3* 1.7* 1.7* 1.9* 2.0*   CBC:  Recent Labs Lab 11/13/15 0544 11/14/15 0325 11/14/15 1709 11/15/15 0632 11/16/15 0334 11/17/15 0709  WBC 18.4* 13.2*  --  11.9* 11.3* 11.2*  NEUTROABS 15.4* 9.6*  --  7.4 6.3  --   HGB 8.8* 6.2* 6.3* 8.7* 8.6* 8.7*  HCT 28.5* 20.0* 20.3* 27.2* 27.1* 27.9*  MCV 87.4 85.5  --  82.4 82.9 82.8  PLT 381 355  --  307 320 384    Cardiac Enzymes:  Recent Labs Lab 11/11/15 1627  TROPONINI <0.03   BNP (last 3 results)  Recent Labs  12/13/14 1620  BNP 56.9    CBG:  Recent Labs Lab 11/17/15 1239 11/17/15 1705 11/17/15 2155 11/18/15 0808 11/18/15 1156  GLUCAP 119* 214* 151* 92 211*    Recent Results (from the past 240 hour(s))  Blood Culture (routine x 2)     Status: Abnormal   Collection Time: 11/11/15  9:00 AM  Result Value Ref Range Status   Specimen Description BLOOD RIGHT HAND  Final   Special Requests BOTTLES DRAWN AEROBIC AND ANAEROBIC 5CC  Final     Culture  Setup Time   Final    GRAM NEGATIVE RODS GRAM POSITIVE COCCI AEROBIC BOTTLE ONLY CRITICAL RESULT CALLED TO, READ BACK BY AND VERIFIED WITH: C. STEWART, PHARM D AT 0916 ON [id number] BY SKY    Culture (A)  Final    PROVIDENCIA STUARTII STAPHYLOCOCCUS SPECIES (COAGULASE NEGATIVE) THE SIGNIFICANCE OF ISOLATING THIS ORGANISM FROM A SINGLE SET OF BLOOD CULTURES WHEN MULTIPLE SETS ARE DRAWN IS UNCERTAIN. PLEASE NOTIFY THE MICROBIOLOGY DEPARTMENT WITHIN ONE WEEK IF SPECIATION AND SENSITIVITIES ARE REQUIRED.    Report Status 11/16/2015 FINAL  Final   Organism ID, Bacteria PROVIDENCIA STUARTII  Final      Susceptibility   Providencia stuartii - MIC*    AMPICILLIN 16 RESISTANT Resistant     CEFAZOLIN >=64 RESISTANT Resistant     CEFEPIME <=1 SENSITIVE Sensitive     CEFTAZIDIME <=1 SENSITIVE Sensitive     CEFTRIAXONE <=1 SENSITIVE Sensitive     CIPROFLOXACIN >=4 RESISTANT Resistant     GENTAMICIN 8 RESISTANT Resistant     IMIPENEM 1 SENSITIVE Sensitive     TRIMETH/SULFA <=20 SENSITIVE Sensitive     AMPICILLIN/SULBACTAM 4 SENSITIVE Sensitive     PIP/TAZO <=4 SENSITIVE Sensitive     * PROVIDENCIA STUARTII  Blood Culture ID Panel (Reflexed)     Status: Abnormal   Collection Time: 11/11/15  9:00 AM  Result Value Ref Range Status   Enterococcus species NOT DETECTED NOT DETECTED Final   Vancomycin resistance NOT DETECTED NOT DETECTED Final   Listeria monocytogenes NOT DETECTED NOT DETECTED Final   Staphylococcus species DETECTED (A) NOT DETECTED Final    Comment: CRITICAL RESULT CALLED TO, READ BACK BY  AND VERIFIED WITH: CNicole Kindred, PHARM D AT [phone] ON [id number] BY S. YARBROUGH    Staphylococcus aureus NOT DETECTED NOT DETECTED Final   Methicillin resistance DETECTED (A) NOT DETECTED Final    Comment: CRITICAL RESULT CALLED TO, READ BACK BY AND VERIFIED WITH: C. Lamont Snowball D AT 0916 ON [id number] BY S. YARBROUGH    Streptococcus species NOT DETECTED NOT DETECTED Final   Streptococcus  agalactiae NOT DETECTED NOT DETECTED Final   Streptococcus pneumoniae NOT DETECTED NOT DETECTED Final   Streptococcus pyogenes NOT DETECTED NOT DETECTED Final   Acinetobacter baumannii NOT DETECTED NOT DETECTED Final   Enterobacteriaceae species NOT DETECTED NOT DETECTED Final   Enterobacter cloacae complex NOT DETECTED NOT DETECTED Final   Escherichia coli NOT DETECTED NOT DETECTED Final   Klebsiella oxytoca NOT DETECTED NOT DETECTED Final   Klebsiella pneumoniae NOT DETECTED NOT DETECTED Final   Proteus species NOT DETECTED NOT DETECTED Final   Serratia marcescens NOT DETECTED NOT DETECTED Final   Carbapenem resistance NOT DETECTED NOT DETECTED Final   Haemophilus influenzae NOT DETECTED NOT DETECTED Final   Neisseria meningitidis NOT DETECTED NOT DETECTED Final   Pseudomonas aeruginosa NOT DETECTED NOT DETECTED Final   Candida albicans NOT DETECTED NOT DETECTED Final   Candida glabrata NOT DETECTED NOT DETECTED Final   Candida krusei NOT DETECTED NOT DETECTED Final   Candida parapsilosis NOT DETECTED NOT DETECTED Final   Candida tropicalis NOT DETECTED NOT DETECTED Final  Blood Culture (routine x 2)     Status: None   Collection Time: 11/11/15  9:10 AM  Result Value Ref Range Status   Specimen Description BLOOD RIGHT ANTECUBITAL  Final   Special Requests   Final    BOTTLES DRAWN AEROBIC AND ANAEROBIC 10CC AER 5CC ANA   Culture NO GROWTH 5 DAYS  Final   Report Status 11/16/2015 FINAL  Final  Urine culture     Status: Abnormal   Collection Time: 11/11/15 10:11 AM  Result Value Ref Range Status   Specimen Description URINE, RANDOM  Final   Special Requests NONE  Final   Culture MULTIPLE SPECIES PRESENT, SUGGEST RECOLLECTION (A)  Final   Report Status 11/12/2015 FINAL  Final  MRSA PCR Screening     Status: None   Collection Time: 11/11/15  4:21 PM  Result Value Ref Range Status   MRSA by PCR NEGATIVE NEGATIVE Final    Comment:        The GeneXpert MRSA Assay (FDA approved for  NASAL specimens only), is one component of a comprehensive MRSA colonization surveillance program. It is not intended to diagnose MRSA infection nor to guide or monitor treatment for MRSA infections.   Culture, Urine     Status: Abnormal   Collection Time: 11/13/15 12:50 PM  Result Value Ref Range Status   Specimen Description URINE, CATHETERIZED  Final   Special Requests NONE  Final   Culture MULTIPLE SPECIES PRESENT, SUGGEST RECOLLECTION (A)  Final   Report Status 11/14/2015 FINAL  Final      Time spent in discharge (includes decision making & examination of pt): >30 minutes  11/18/2015, 3:25 PM   Cherene Altes, MD [facility] Office  [phone] Pager [phone]  On-Call/Text Page:      [email or web address]      password [facility]

## 2015-11-18 NOTE — Progress Notes (Signed)
Patient made RN aware that she has a friend at home waiting for her named Delfino Lovett. RN called patient's cousin Densie to confirm. Per [name], she just left the patient's house 20-30 minutes ago and Delfino Lovett was there and waiting. RN to call [facility].

## 2015-11-18 NOTE — Care Management Note (Signed)
Case Management Note  Patient Details  Name: [name] MRN: [id number] Date of Birth: Feb 28, 1961  Subjective/Objective:                    Action/Plan:  Confirmed face sheet information with patient . Patient wants hospital bed , does not want hoyer lift , and already has wheel chair at home. Patient wants ambulance transport home ( confirmed address) . Confirmed with Ke[facility] at [facility] they have accepted  Referral.   Completed paper work for ambulance transport home and placed in shadow chart with [place] phone number , bedside nurse can call for transport when ready to discharge patient. Expected Discharge Date:  11/18/15               Expected Discharge Plan:  [place]  In-House Referral:     Discharge planning Services  CM Consult  Post Acute Care Choice:  Durable Medical Equipment, Home Health Choice offered to:  Patient  DME Arranged:  Hospital bed DME Agency:     HH Arranged:  RN, PT, OT, Nurse's Aide, Social Work [employer] Agency:  [place]  Status of Service:  Completed, signed off  Medicare Important Message Given:  Yes Date Medicare IM Given:    Medicare IM give by:    Date Additional Medicare IM Given:    Additional Medicare Important Message give by:     If discussed at [place] of Stay Meetings, dates discussed:    Additional Comments:  Marilu Favre, RN 11/18/2015, 2:30 PM

## 2015-11-18 NOTE — Clinical Social Work Note (Signed)
CSW received referral for SNF.  Case discussed with case manager, and plan is to discharge home with home health.  CSW to sign off please re-consult if social work needs arise.  Icelyn Navarrete R. Mariachristina Holle, MSW, LCSWA [phone]  

## 2015-11-18 NOTE — Discharge Instructions (Signed)

## 2015-11-18 NOTE — Progress Notes (Signed)
Patient has been discharged by physician. Discharge instructions and prescriptions given to patient. Discharge form signed by patient. Patient unable to reach anyone via telephone at home and unable to confirm anyone is waiting for her arrival at her home. Patient does not have keys to her house. Will wait to send patient home by ambulance once she confirms someone is waiting for her at home.

## 2015-11-18 NOTE — Progress Notes (Signed)
Patient to go home with foley per Dr.McClung

## 2015-11-20 DIAGNOSIS — N319 Neuromuscular dysfunction of bladder, unspecified: Secondary | ICD-10-CM | POA: Diagnosis not present

## 2015-11-20 DIAGNOSIS — G35 Multiple sclerosis: Secondary | ICD-10-CM | POA: Diagnosis not present

## 2015-11-20 DIAGNOSIS — T83518D Infection and inflammatory reaction due to other urinary catheter, subsequent encounter: Secondary | ICD-10-CM | POA: Diagnosis not present

## 2015-11-20 DIAGNOSIS — A411 Sepsis due to other specified staphylococcus: Secondary | ICD-10-CM | POA: Diagnosis not present

## 2015-11-26 ENCOUNTER — Telehealth: Payer: Self-pay | Admitting: *Deleted

## 2015-11-26 NOTE — Telephone Encounter (Signed)
[name] with home health called and stated that she needed order to continue Physical Therapy for trunk strength and mobility. She stated that patient has been discharged home from [facility] and has been at home and needs PT renewed. Informed her that she will need to contact patient's Primary Doctor. She agreed.

## 2015-12-18 ENCOUNTER — Inpatient Hospital Stay (HOSPITAL_COMMUNITY): Payer: Medicare Other

## 2015-12-18 ENCOUNTER — Emergency Department (HOSPITAL_COMMUNITY): Payer: Medicare Other

## 2015-12-18 ENCOUNTER — Encounter (HOSPITAL_COMMUNITY): Payer: Self-pay | Admitting: Emergency Medicine

## 2015-12-18 ENCOUNTER — Inpatient Hospital Stay (HOSPITAL_COMMUNITY)
Admission: EM | Admit: 2015-12-18 | Discharge: 2015-12-30 | DRG: 698 | Disposition: A | Discharge status: Skilled Nursing Facility | Payer: Medicare Other | Attending: Internal Medicine | Admitting: Internal Medicine

## 2015-12-18 DIAGNOSIS — N183 Chronic kidney disease, stage 3 (moderate): Secondary | ICD-10-CM | POA: Diagnosis present

## 2015-12-18 DIAGNOSIS — Z7984 Long term (current) use of oral hypoglycemic drugs: Secondary | ICD-10-CM | POA: Diagnosis not present

## 2015-12-18 DIAGNOSIS — N200 Calculus of kidney: Secondary | ICD-10-CM | POA: Diagnosis not present

## 2015-12-18 DIAGNOSIS — Y738 Miscellaneous gastroenterology and urology devices associated with adverse incidents, not elsewhere classified: Secondary | ICD-10-CM | POA: Diagnosis not present

## 2015-12-18 DIAGNOSIS — R6510 Systemic inflammatory response syndrome (SIRS) of non-infectious origin without acute organ dysfunction: Secondary | ICD-10-CM | POA: Diagnosis not present

## 2015-12-18 DIAGNOSIS — G92 Toxic encephalopathy: Secondary | ICD-10-CM | POA: Diagnosis present

## 2015-12-18 DIAGNOSIS — I482 Chronic atrial fibrillation: Secondary | ICD-10-CM | POA: Diagnosis present

## 2015-12-18 DIAGNOSIS — Z79899 Other long term (current) drug therapy: Secondary | ICD-10-CM

## 2015-12-18 DIAGNOSIS — N202 Calculus of kidney with calculus of ureter: Secondary | ICD-10-CM | POA: Diagnosis present

## 2015-12-18 DIAGNOSIS — D638 Anemia in other chronic diseases classified elsewhere: Secondary | ICD-10-CM | POA: Diagnosis present

## 2015-12-18 DIAGNOSIS — K567 Ileus, unspecified: Secondary | ICD-10-CM

## 2015-12-18 DIAGNOSIS — L89154 Pressure ulcer of sacral region, stage 4: Secondary | ICD-10-CM | POA: Diagnosis not present

## 2015-12-18 DIAGNOSIS — R6520 Severe sepsis without septic shock: Secondary | ICD-10-CM | POA: Diagnosis present

## 2015-12-18 DIAGNOSIS — R7881 Bacteremia: Secondary | ICD-10-CM | POA: Diagnosis present

## 2015-12-18 DIAGNOSIS — R64 Cachexia: Secondary | ICD-10-CM | POA: Diagnosis present

## 2015-12-18 DIAGNOSIS — G35 Multiple sclerosis: Secondary | ICD-10-CM | POA: Diagnosis present

## 2015-12-18 DIAGNOSIS — B957 Other staphylococcus as the cause of diseases classified elsewhere: Secondary | ICD-10-CM | POA: Diagnosis not present

## 2015-12-18 DIAGNOSIS — Z515 Encounter for palliative care: Secondary | ICD-10-CM | POA: Diagnosis not present

## 2015-12-18 DIAGNOSIS — E440 Moderate protein-calorie malnutrition: Secondary | ICD-10-CM | POA: Diagnosis present

## 2015-12-18 DIAGNOSIS — A419 Sepsis, unspecified organism: Secondary | ICD-10-CM | POA: Diagnosis not present

## 2015-12-18 DIAGNOSIS — D696 Thrombocytopenia, unspecified: Secondary | ICD-10-CM | POA: Diagnosis not present

## 2015-12-18 DIAGNOSIS — N179 Acute kidney failure, unspecified: Secondary | ICD-10-CM | POA: Diagnosis not present

## 2015-12-18 DIAGNOSIS — M21371 Foot drop, right foot: Secondary | ICD-10-CM | POA: Diagnosis present

## 2015-12-18 DIAGNOSIS — E1140 Type 2 diabetes mellitus with diabetic neuropathy, unspecified: Secondary | ICD-10-CM | POA: Diagnosis present

## 2015-12-18 DIAGNOSIS — Z87891 Personal history of nicotine dependence: Secondary | ICD-10-CM

## 2015-12-18 DIAGNOSIS — T83511D Infection and inflammatory reaction due to indwelling urethral catheter, subsequent encounter: Secondary | ICD-10-CM | POA: Diagnosis not present

## 2015-12-18 DIAGNOSIS — Y846 Urinary catheterization as the cause of abnormal reaction of the patient, or of later complication, without mention of misadventure at the time of the procedure: Secondary | ICD-10-CM | POA: Diagnosis present

## 2015-12-18 DIAGNOSIS — N136 Pyonephrosis: Secondary | ICD-10-CM | POA: Diagnosis present

## 2015-12-18 DIAGNOSIS — T83511S Infection and inflammatory reaction due to indwelling urethral catheter, sequela: Secondary | ICD-10-CM | POA: Diagnosis not present

## 2015-12-18 DIAGNOSIS — M21372 Foot drop, left foot: Secondary | ICD-10-CM | POA: Diagnosis present

## 2015-12-18 DIAGNOSIS — I129 Hypertensive chronic kidney disease with stage 1 through stage 4 chronic kidney disease, or unspecified chronic kidney disease: Secondary | ICD-10-CM | POA: Diagnosis present

## 2015-12-18 DIAGNOSIS — E870 Hyperosmolality and hypernatremia: Secondary | ICD-10-CM | POA: Diagnosis present

## 2015-12-18 DIAGNOSIS — Z95828 Presence of other vascular implants and grafts: Secondary | ICD-10-CM

## 2015-12-18 DIAGNOSIS — A411 Sepsis due to other specified staphylococcus: Secondary | ICD-10-CM | POA: Diagnosis present

## 2015-12-18 DIAGNOSIS — Z6824 Body mass index (BMI) 24.0-24.9, adult: Secondary | ICD-10-CM | POA: Diagnosis not present

## 2015-12-18 DIAGNOSIS — E1122 Type 2 diabetes mellitus with diabetic chronic kidney disease: Secondary | ICD-10-CM | POA: Diagnosis present

## 2015-12-18 DIAGNOSIS — N3000 Acute cystitis without hematuria: Secondary | ICD-10-CM | POA: Diagnosis not present

## 2015-12-18 DIAGNOSIS — R140 Abdominal distension (gaseous): Secondary | ICD-10-CM

## 2015-12-18 DIAGNOSIS — E860 Dehydration: Secondary | ICD-10-CM | POA: Diagnosis present

## 2015-12-18 DIAGNOSIS — E1121 Type 2 diabetes mellitus with diabetic nephropathy: Secondary | ICD-10-CM | POA: Diagnosis present

## 2015-12-18 DIAGNOSIS — N12 Tubulo-interstitial nephritis, not specified as acute or chronic: Secondary | ICD-10-CM | POA: Diagnosis present

## 2015-12-18 DIAGNOSIS — N390 Urinary tract infection, site not specified: Secondary | ICD-10-CM | POA: Diagnosis present

## 2015-12-18 DIAGNOSIS — K5669 Other intestinal obstruction: Secondary | ICD-10-CM | POA: Diagnosis not present

## 2015-12-18 DIAGNOSIS — Z7901 Long term (current) use of anticoagulants: Secondary | ICD-10-CM | POA: Diagnosis not present

## 2015-12-18 DIAGNOSIS — B9689 Other specified bacterial agents as the cause of diseases classified elsewhere: Secondary | ICD-10-CM | POA: Diagnosis not present

## 2015-12-18 DIAGNOSIS — Z8673 Personal history of transient ischemic attack (TIA), and cerebral infarction without residual deficits: Secondary | ICD-10-CM

## 2015-12-18 DIAGNOSIS — E872 Acidosis: Secondary | ICD-10-CM | POA: Diagnosis present

## 2015-12-18 DIAGNOSIS — D72829 Elevated white blood cell count, unspecified: Secondary | ICD-10-CM | POA: Diagnosis not present

## 2015-12-18 DIAGNOSIS — I480 Paroxysmal atrial fibrillation: Secondary | ICD-10-CM | POA: Diagnosis not present

## 2015-12-18 DIAGNOSIS — T83511A Infection and inflammatory reaction due to indwelling urethral catheter, initial encounter: Secondary | ICD-10-CM | POA: Diagnosis not present

## 2015-12-18 DIAGNOSIS — Z86718 Personal history of other venous thrombosis and embolism: Secondary | ICD-10-CM

## 2015-12-18 DIAGNOSIS — T83510D Infection and inflammatory reaction due to cystostomy catheter, subsequent encounter: Secondary | ICD-10-CM | POA: Diagnosis not present

## 2015-12-18 DIAGNOSIS — E876 Hypokalemia: Secondary | ICD-10-CM | POA: Diagnosis present

## 2015-12-18 DIAGNOSIS — R627 Adult failure to thrive: Secondary | ICD-10-CM | POA: Diagnosis present

## 2015-12-18 DIAGNOSIS — T83518A Infection and inflammatory reaction due to other urinary catheter, initial encounter: Secondary | ICD-10-CM | POA: Diagnosis present

## 2015-12-18 DIAGNOSIS — N319 Neuromuscular dysfunction of bladder, unspecified: Secondary | ICD-10-CM | POA: Diagnosis present

## 2015-12-18 DIAGNOSIS — R4182 Altered mental status, unspecified: Secondary | ICD-10-CM | POA: Diagnosis present

## 2015-12-18 DIAGNOSIS — E869 Volume depletion, unspecified: Secondary | ICD-10-CM

## 2015-12-18 DIAGNOSIS — T83510A Infection and inflammatory reaction due to cystostomy catheter, initial encounter: Secondary | ICD-10-CM | POA: Diagnosis not present

## 2015-12-18 DIAGNOSIS — K56600 Partial intestinal obstruction, unspecified as to cause: Secondary | ICD-10-CM

## 2015-12-18 DIAGNOSIS — K560 Paralytic ileus: Secondary | ICD-10-CM | POA: Diagnosis present

## 2015-12-18 HISTORY — DX: Cerebral infarction, unspecified: I63.9

## 2015-12-18 LAB — CBC WITH DIFFERENTIAL/PLATELET
Basophils Absolute: 0 10*3/uL (ref 0.0–0.1)
Basophils Relative: 0 %
EOS PCT: 0 %
Eosinophils Absolute: 0 10*3/uL (ref 0.0–0.7)
HCT: 32.6 % — ABNORMAL LOW (ref 36.0–46.0)
HEMOGLOBIN: 10.6 g/dL — AB (ref 12.0–15.0)
LYMPHS PCT: 8 %
Lymphs Abs: 1.1 10*3/uL (ref 0.7–4.0)
MCH: 27.5 pg (ref 26.0–34.0)
MCHC: 32.5 g/dL (ref 30.0–36.0)
MCV: 84.7 fL (ref 78.0–100.0)
MONO ABS: 0.6 10*3/uL (ref 0.1–1.0)
MONOS PCT: 4 %
NEUTROS PCT: 88 %
Neutro Abs: 12.5 10*3/uL — ABNORMAL HIGH (ref 1.7–7.7)
PLATELETS: 345 10*3/uL (ref 150–400)
RBC: 3.85 MIL/uL — AB (ref 3.87–5.11)
RDW: 19.5 % — ABNORMAL HIGH (ref 11.5–15.5)
WBC MORPHOLOGY: INCREASED
WBC: 14.2 10*3/uL — AB (ref 4.0–10.5)

## 2015-12-18 LAB — URINE MICROSCOPIC-ADD ON

## 2015-12-18 LAB — BRAIN NATRIURETIC PEPTIDE: B NATRIURETIC PEPTIDE 5: 84.8 pg/mL (ref 0.0–100.0)

## 2015-12-18 LAB — COMPREHENSIVE METABOLIC PANEL
ALBUMIN: 2.2 g/dL — AB (ref 3.5–5.0)
ALT: 8 U/L — AB (ref 14–54)
AST: 15 U/L (ref 15–41)
Alkaline Phosphatase: 40 U/L (ref 38–126)
Anion gap: 7 (ref 5–15)
BUN: 62 mg/dL — AB (ref 6–20)
CHLORIDE: 116 mmol/L — AB (ref 101–111)
CO2: 20 mmol/L — AB (ref 22–32)
Calcium: 9.1 mg/dL (ref 8.9–10.3)
Creatinine, Ser: 3.32 mg/dL — ABNORMAL HIGH (ref 0.44–1.00)
GFR calc Af Amer: 17 mL/min — ABNORMAL LOW (ref >60)
GFR calc non Af Amer: 14 mL/min — ABNORMAL LOW (ref >60)
GLUCOSE: 198 mg/dL — AB (ref 65–99)
POTASSIUM: 4.8 mmol/L (ref 3.5–5.1)
SODIUM: 143 mmol/L (ref 135–145)
Total Bilirubin: 0.7 mg/dL (ref 0.3–1.2)
Total Protein: 6.6 g/dL (ref 6.5–8.1)

## 2015-12-18 LAB — URINALYSIS, ROUTINE W REFLEX MICROSCOPIC
BILIRUBIN URINE: NEGATIVE
Glucose, UA: NEGATIVE mg/dL
KETONES UR: NEGATIVE mg/dL
NITRITE: NEGATIVE
Protein, ur: 100 mg/dL — AB
SPECIFIC GRAVITY, URINE: 1.014 (ref 1.005–1.030)
pH: 8 (ref 5.0–8.0)

## 2015-12-18 LAB — MRSA PCR SCREENING: MRSA by PCR: NEGATIVE

## 2015-12-18 LAB — I-STAT TROPONIN, ED: Troponin i, poc: 0.01 ng/mL (ref 0.00–0.08)

## 2015-12-18 LAB — GLUCOSE, CAPILLARY
Glucose-Capillary: 156 mg/dL — ABNORMAL HIGH (ref 65–99)
Glucose-Capillary: 135 mg/dL — ABNORMAL HIGH (ref 65–99)

## 2015-12-18 LAB — LACTIC ACID, PLASMA: Lactic Acid, Venous: 1.1 mmol/L (ref 0.5–1.9)

## 2015-12-18 LAB — I-STAT CG4 LACTIC ACID, ED: Lactic Acid, Venous: 2.46 mmol/L (ref 0.5–1.9)

## 2015-12-18 MED ORDER — INSULIN ASPART 100 UNIT/ML ~~LOC~~ SOLN: 0.0000 [IU] | Freq: Every day | SUBCUTANEOUS | Status: DC

## 2015-12-18 MED ORDER — ADULT MULTIVITAMIN W/MINERALS CH
1.0000 | ORAL_TABLET | Freq: Every day | ORAL | Status: DC
  Administered 2015-12-19 – 2015-12-30 (×10): 1 via ORAL
  Filled 2015-12-18 (×10): qty 1

## 2015-12-18 MED ORDER — HYDROCODONE-ACETAMINOPHEN 5-325 MG PO TABS
1.0000 | ORAL_TABLET | Freq: Four times a day (QID) | ORAL | Status: DC | PRN
  Administered 2015-12-21 – 2015-12-29 (×7): 1 via ORAL
  Filled 2015-12-18 (×7): qty 1

## 2015-12-18 MED ORDER — SODIUM CHLORIDE 0.9 % IV SOLN
INTRAVENOUS | Status: DC
  Administered 2015-12-18 – 2015-12-20 (×4): via INTRAVENOUS

## 2015-12-18 MED ORDER — FAMOTIDINE IN NACL 20-0.9 MG/50ML-% IV SOLN
20.0000 mg | INTRAVENOUS | Status: DC
  Administered 2015-12-19 – 2015-12-20 (×2): 20 mg via INTRAVENOUS
  Filled 2015-12-18 (×2): qty 50

## 2015-12-18 MED ORDER — GABAPENTIN 100 MG PO CAPS
100.0000 mg | ORAL_CAPSULE | Freq: Every day | ORAL | Status: DC
  Administered 2015-12-19 – 2015-12-30 (×11): 100 mg via ORAL
  Filled 2015-12-18 (×11): qty 1

## 2015-12-18 MED ORDER — VITAMIN C 500 MG PO TABS
500.0000 mg | ORAL_TABLET | Freq: Every day | ORAL | Status: DC
  Administered 2015-12-19 – 2015-12-30 (×10): 500 mg via ORAL
  Filled 2015-12-18 (×10): qty 1

## 2015-12-18 MED ORDER — VANCOMYCIN HCL IN DEXTROSE 1-5 GM/200ML-% IV SOLN
1000.0000 mg | Freq: Once | INTRAVENOUS | Status: AC
  Administered 2015-12-18: 1000 mg via INTRAVENOUS
  Filled 2015-12-18: qty 200

## 2015-12-18 MED ORDER — INSULIN ASPART 100 UNIT/ML ~~LOC~~ SOLN
0.0000 [IU] | Freq: Three times a day (TID) | SUBCUTANEOUS | Status: DC
  Administered 2015-12-19: 1 [IU] via SUBCUTANEOUS
  Administered 2015-12-20: 2 [IU] via SUBCUTANEOUS
  Administered 2015-12-20: 1 [IU] via SUBCUTANEOUS
  Administered 2015-12-20 – 2015-12-22 (×7): 2 [IU] via SUBCUTANEOUS

## 2015-12-18 MED ORDER — DARIFENACIN HYDROBROMIDE ER 7.5 MG PO TB24
7.5000 mg | ORAL_TABLET | Freq: Every day | ORAL | Status: DC
  Administered 2015-12-19 – 2015-12-30 (×11): 7.5 mg via ORAL
  Filled 2015-12-18 (×12): qty 1

## 2015-12-18 MED ORDER — PIPERACILLIN-TAZOBACTAM IN DEX 2-0.25 GM/50ML IV SOLN
2.2500 g | Freq: Three times a day (TID) | INTRAVENOUS | Status: DC
  Administered 2015-12-18 – 2015-12-19 (×3): 2.25 g via INTRAVENOUS
  Filled 2015-12-18 (×4): qty 50

## 2015-12-18 MED ORDER — FAMOTIDINE 20 MG PO TABS: 20.0000 mg | ORAL_TABLET | Freq: Two times a day (BID) | ORAL | Status: DC

## 2015-12-18 MED ORDER — PRO-STAT SUGAR FREE PO LIQD
30.0000 mL | Freq: Two times a day (BID) | ORAL | Status: DC
  Administered 2015-12-19 – 2015-12-21 (×4): 30 mL via ORAL
  Filled 2015-12-18 (×5): qty 30

## 2015-12-18 MED ORDER — VANCOMYCIN HCL IN DEXTROSE 1-5 GM/200ML-% IV SOLN: 1000.0000 mg | INTRAVENOUS | Status: DC

## 2015-12-18 MED ORDER — ALBUTEROL SULFATE HFA 108 (90 BASE) MCG/ACT IN AERS: 2.0000 | INHALATION_SPRAY | RESPIRATORY_TRACT | Status: DC | PRN

## 2015-12-18 MED ORDER — PIPERACILLIN-TAZOBACTAM 3.375 G IVPB 30 MIN
3.3750 g | Freq: Once | INTRAVENOUS | Status: AC
  Administered 2015-12-18: 3.375 g via INTRAVENOUS

## 2015-12-18 MED ORDER — BACLOFEN 10 MG PO TABS
10.0000 mg | ORAL_TABLET | Freq: Three times a day (TID) | ORAL | Status: DC
  Administered 2015-12-19 – 2015-12-30 (×34): 10 mg via ORAL
  Filled 2015-12-18 (×35): qty 1

## 2015-12-18 MED ORDER — SODIUM CHLORIDE 0.9 % IV BOLUS (SEPSIS)
1000.0000 mL | Freq: Once | INTRAVENOUS | Status: AC
  Administered 2015-12-18: 1000 mL via INTRAVENOUS

## 2015-12-18 MED ORDER — PRAVASTATIN SODIUM 20 MG PO TABS
20.0000 mg | ORAL_TABLET | Freq: Every day | ORAL | Status: DC
  Administered 2015-12-19 – 2015-12-29 (×11): 20 mg via ORAL
  Filled 2015-12-18 (×11): qty 1

## 2015-12-18 MED ORDER — VANCOMYCIN HCL IN DEXTROSE 1-5 GM/200ML-% IV SOLN: 1000.0000 mg | Freq: Once | INTRAVENOUS | Status: DC

## 2015-12-18 MED ORDER — PIPERACILLIN-TAZOBACTAM 3.375 G IVPB
3.3750 g | Freq: Once | INTRAVENOUS | Status: DC
  Filled 2015-12-18: qty 50

## 2015-12-18 MED ORDER — FAMOTIDINE 20 MG PO TABS: 20.0000 mg | ORAL_TABLET | Freq: Every day | ORAL | Status: DC

## 2015-12-18 MED ORDER — ALBUTEROL SULFATE (2.5 MG/3ML) 0.083% IN NEBU: 2.5000 mg | INHALATION_SOLUTION | Freq: Four times a day (QID) | RESPIRATORY_TRACT | Status: DC | PRN

## 2015-12-18 MED ORDER — SODIUM CHLORIDE 0.9 % IV SOLN
Freq: Once | INTRAVENOUS | Status: AC
  Administered 2015-12-18: 100 mL/h via INTRAVENOUS

## 2015-12-18 MED ORDER — PIPERACILLIN-TAZOBACTAM 3.375 G IVPB: 3.3750 g | Freq: Once | INTRAVENOUS | Status: DC

## 2015-12-18 NOTE — H&P (Signed)
History and Physical  [name] [id number] DOB: 11-23-1960 DOA: 12/18/2015  Referring physician: ER Physician PCP: No primary care provider on file.  Outpatient Specialists:    Patient coming from: Home  Chief Complaint: Altered mentation  HPI:  55 year old Female, chronically ill looking, with history of MS, decubitus ulcers, documented AKI and CKD, UTI, DVT, CVA and malnutrition. Patient lives at home, but has neighbor and Home Health checking on her. Patient is not able to give any history. Patient is said to have been noted to be somnolent yesterday and the condition got worse today. On presentation to the hospital, UA done is suggestive of UTI, and CBC reveals elevated WBC with bandemia. Lactic acid is elevated, and Scr has risen from 0.54 to 3.32. Patient was on Metformin, bactrim, PRN lasix and Xarelto 20 MG po once daily prior to current admission. Patient was hypotensive on presentation, remained hypotensive after 3 liters of IVF, and just starting to run low normal BP with 4th. Liter of IVF. As mentioned above, patient is not able to give any history.  ED Course: Fluid resuscitation, IV antibiotics  Pertinent labs: BUN of 62, Scr of 3.32, CO2 of 20, lactic acid of 2.46, UA suggestive of UTI, WBC of 14.2 with left shift. EKG: Independently reviewed.  Imaging: independently reviewed.  Review of Systems: Unobtainable.  Past Medical History  Diagnosis Date  . Diabetes mellitus without complication ([place])   . Multiple sclerosis diagnosed 2002-not on Therapy any longer 06/26/2012  . Stage 4 decubitus ulcer ([place]) 07/05/2015  . Acute renal failure superimposed on stage 3 chronic kidney disease ([place]) 07/21/2015  . Malnutrition of moderate degree 07/21/2015  . UTI (lower urinary tract infection)   . DVT (deep venous thrombosis) ([place])   . Stroke [facility])     Past Surgical History  Procedure Laterality Date  . Hernia mesh removal    . Tubes tided    . Tubal ligation    .  Esophagogastroduodenoscopy Left 01/02/2015    Procedure: ESOPHAGOGASTRODUODENOSCOPY (EGD);  Surgeon: Arta Silence, MD;  Location: [facility];  Service: Endoscopy;  Laterality: Left;     reports that she quit smoking about 12 months ago. Her smoking use included Cigarettes. She has a 20 pack-year smoking history. She has never used smokeless tobacco. She reports that she drinks about 3.0 oz of alcohol per week. She reports that she does not use illicit drugs.  No Known Allergies  Family History  Problem Relation Age of Onset  . Diabetes Mother   . Diabetes Father   . Diabetes Sister   . Scoliosis Sister   . Alzheimer's disease Mother      Prior to Admission medications   Medication Sig Start Date End Date Taking? Authorizing Provider  Acetaminophen (APAP) 325 MG tablet Take 650 mg by mouth every 6 (six) hours as needed for pain.    Historical Provider, MD  albuterol (PROVENTIL HFA;VENTOLIN HFA) 108 (90 BASE) MCG/ACT inhaler Inhale 2 puffs into the lungs every 4 (four) hours as needed for wheezing or shortness of breath. 06/29/12   [name], MD  Amino Acids-Protein Hydrolys (FEEDING SUPPLEMENT, PRO-STAT SUGAR FREE 64,) LIQD Take 30 mLs by mouth 2 (two) times daily. 11/18/15   Cherene Altes, MD  baclofen (LIORESAL) 10 MG tablet Take 1 tablet (10 mg total) by mouth 3 (three) times daily. 11/18/15   Cherene Altes, MD  famotidine (PEPCID) 20 MG tablet Take 1 tablet (20 mg total) by mouth 2 (two)  times daily. 11/18/15   Cherene Altes, MD  furosemide (LASIX) 40 MG tablet Take 1 tablet (40 mg total) by mouth daily as needed for fluid or edema. Reported on 07/05/2015 11/18/15   Cherene Altes, MD  gabapentin (NEURONTIN) 100 MG capsule Take 1 capsule (100 mg total) by mouth at bedtime. 11/18/15   Cherene Altes, MD  HYDROcodone-acetaminophen (NORCO) 5-325 MG tablet Take 1 tablet by mouth every 6 (six) hours as needed for moderate pain. 11/18/15   Cherene Altes, MD  metFORMIN  (GLUCOPHAGE) 500 MG tablet Take 1 tablet (500 mg total) by mouth 2 (two) times daily. 11/18/15   Cherene Altes, MD  Multiple Vitamin (MULTIVITAMIN WITH MINERALS) TABS tablet Take 1 tablet by mouth daily. 07/09/15   Florencia Reasons, MD  Omega 3 1000 MG CAPS Take 1,000 mg by mouth daily.    Historical Provider, MD  polyethylene glycol (MIRALAX / GLYCOLAX) packet Take 17 g by mouth 2 (two) times daily. 11/18/15   Cherene Altes, MD  pravastatin (PRAVACHOL) 20 MG tablet Take 1 tablet (20 mg total) by mouth daily at 6 PM. 11/18/15   Cherene Altes, MD  rivaroxaban (XARELTO) 20 MG TABS tablet Take 1 tablet (20 mg total) by mouth daily with supper. Reported on 08/12/2015 11/18/15   Cherene Altes, MD  solifenacin (VESICARE) 5 MG tablet Take 1 tablet (5 mg total) by mouth daily. 11/18/15   Cherene Altes, MD  sulfamethoxazole-trimethoprim (BACTRIM DS,SEPTRA DS) 800-160 MG tablet Take 1 tablet by mouth every 12 (twelve) hours. 11/18/15   Cherene Altes, MD  vitamin C (ASCORBIC ACID) 500 MG tablet Take 500 mg by mouth daily.    Historical Provider, MD    Physical Exam: Filed Vitals:   12/18/15 1345 12/18/15 1400 12/18/15 1415 12/18/15 1430  BP: 103/57 94/57 106/77 101/62  Pulse:      Temp:      TempSrc:      Resp: 18 16 16 16   Height:      Weight:      SpO2: 100% 100% 100% 100%   Constitutional:  . Chronically ill looking. Comfortable. Eyes:  . Pallor. No jaundice.  ENMT:  . external ears, nose appear normal Neck:  . Neck is supple. No JVD Respiratory:  . CTA bilaterally, no w/r/r.  . Respiratory effort normal. No retractions or accessory muscle use Cardiovascular:  . S1S2 . No LE extremity edema  Abdomen:  . Abdomen is soft and non tender. Organs are difficult to assess. Neurologic:  . Awake.  Extremities: Chronic edema (not significant), and associated skin changes. Heels with pads.  Wt Readings from Last 3 Encounters:  12/18/15 77.111 kg (170 lb)  11/16/15 75.8 kg (167 lb 1.7 oz)    10/29/15 59.875 kg (132 lb)    I have personally reviewed following labs and imaging studies  Labs on Admission:  CBC:  Recent Labs Lab 12/18/15 1035  WBC 14.2*  NEUTROABS 12.5*  HGB 10.6*  HCT 32.6*  MCV 84.7  PLT 123456   Basic Metabolic Panel:  Recent Labs Lab 12/18/15 1035  NA 143  K 4.8  CL 116*  CO2 20*  GLUCOSE 198*  BUN 62*  CREATININE 3.32*  CALCIUM 9.1   Liver Function Tests:  Recent Labs Lab 12/18/15 1035  AST 15  ALT 8*  ALKPHOS 40  BILITOT 0.7  PROT 6.6  ALBUMIN 2.2*   No results for input(s): LIPASE, AMYLASE in the last 168 hours.  No results for input(s): AMMONIA in the last 168 hours. Coagulation Profile: No results for input(s): INR, PROTIME in the last 168 hours. Cardiac Enzymes: No results for input(s): CKTOTAL, CKMB, CKMBINDEX, TROPONINI in the last 168 hours. BNP (last 3 results) No results for input(s): PROBNP in the last 8760 hours. HbA1C: No results for input(s): HGBA1C in the last 72 hours. CBG: No results for input(s): GLUCAP in the last 168 hours. Lipid Profile: No results for input(s): CHOL, HDL, LDLCALC, TRIG, CHOLHDL, LDLDIRECT in the last 72 hours. Thyroid Function Tests: No results for input(s): TSH, T4TOTAL, FREET4, T3FREE, THYROIDAB in the last 72 hours. Anemia Panel: No results for input(s): VITAMINB12, FOLATE, FERRITIN, TIBC, IRON, RETICCTPCT in the last 72 hours. Urine analysis:    Component Value Date/Time   COLORURINE YELLOW 12/18/2015 1118   APPEARANCEUR TURBID* 12/18/2015 1118   LABSPEC 1.014 12/18/2015 1118   PHURINE 8.0 12/18/2015 1118   GLUCOSEU NEGATIVE 12/18/2015 1118   HGBUR MODERATE* 12/18/2015 1118   BILIRUBINUR NEGATIVE 12/18/2015 1118   KETONESUR NEGATIVE 12/18/2015 1118   PROTEINUR 100* 12/18/2015 1118   UROBILINOGEN 0.2 12/20/2014 1851   NITRITE NEGATIVE 12/18/2015 1118   LEUKOCYTESUR LARGE* 12/18/2015 1118   Sepsis Labs: @LABRCNTIP (procalcitonin:4,lacticidven:4) )No results found for  this or any previous visit (from the past 240 hour(s)).    Radiological Exams on Admission: Ct Head Wo Contrast  12/18/2015  CLINICAL DATA:  Altered mental status. EXAM: CT HEAD WITHOUT CONTRAST TECHNIQUE: Contiguous axial images were obtained from the base of the skull through the vertex without intravenous contrast. COMPARISON:  CT 08/31/2015.  MRI 07/21/2015. FINDINGS: Generalized brain atrophy. Chronic small-vessel ischemic changes of the deep white matter. No sign of acute infarction, mass lesion, hemorrhage, hydrocephalus or extra-axial collection. No calvarial abnormality. Sinuses, middle ears and mastoids are clear. IMPRESSION: No acute finding. Atrophy and chronic small-vessel disease of the white matter. Electronically Signed   By: Nelson Chimes M.D.   On: 12/18/2015 11:50   Dg Chest Port 1 View  12/18/2015  CLINICAL DATA:  Weakness, altered mental status EXAM: PORTABLE CHEST 1 VIEW COMPARISON:  Chest x-rays dated 11/11/2015 and 12/14/2014. FINDINGS: Heart size is normal. Overall cardiomediastinal silhouette is normal in size and configuration. Study is hypoinspiratory with crowding of the perihilar bronchovascular markings. Given the low lung volumes, lungs appear clear. Questionable mild atelectasis or small pleural effusion at the left costophrenic angle. IMPRESSION: Questionable mild atelectasis or small pleural effusion at the left costophrenic angle. Lungs otherwise clear. No evidence of pneumonia. No large pleural effusion. Heart size normal. Electronically Signed   By: Franki Cabot M.D.   On: 12/18/2015 11:17    EKG: Independently reviewed.  Active Problems:   UTI (urinary tract infection)   SIRS (systemic inflammatory response syndrome) (HCC)   Assessment/Plan 1. SIRS Versus UTI/Sepsis 2. AKI possible pre renal versus combined pre renal and ATN 3. Volume depletion 4. Encephalopathy, likely combined toxic and metabolic 5. DVT 6. MS 7. DM 8. Decubitus ulcers, present on  admission 9. Failure to thrive 10. Moderate Malnutrition   Admit patient to step down  Continue fluid resuscitation  Pan culture patient  IV antibiotics  Repeat lactic acid level  Renal ultrasound  AKI work up  Further management will depend on hospital course  DVT prophylaxis: Hold Xarelto for today, and consider restarting in am if AKI has improved significantly. Pharmacy will follow. Code Status: Full Family Communication: None Disposition Plan: To be determined   Consults called: None   Admission  status: Inpatient    Time spent: 32 minutes  Dana Allan, MD  [facility] Pager #: [phone] 7PM-7AM contact night coverage as above   12/18/2015, 2:36 PM

## 2015-12-18 NOTE — ED Notes (Signed)
Notified Doctor of patient blood pressure.

## 2015-12-18 NOTE — ED Notes (Signed)
Arrived via EMS family friend checked on patient who lives by herself yesterday unresponsive altered mental status. Checked on her again same symptoms. Stated usually talk alert goes there for medication and feed her. EMS reported initial BP 84/57 CBG 206 given IV fluids 0.9 NS bolus 800ML and repeat BP 100/50. Upon arrival patient response to pain garbled speech. Doctor at bedside.

## 2015-12-18 NOTE — ED Provider Notes (Signed)
CSN: [id number]     Arrival date & time 12/18/15  1023 History   First MD Initiated Contact with Patient 12/18/15 1024     Chief Complaint  Patient presents with  . Altered Mental Status     (Consider location/radiation/quality/duration/timing/severity/associated sxs/prior Treatment) HPI Comments: Patient is a 55 year old female with past medical history of MS, diabetes, renal insufficiency. She was brought by EMS for evaluation of decreased responsiveness. She apparently lives at home by herself with assistance from friends and home health. Yesterday the friend checked on her and found her to be somnolent. Thinking she was just tired, they left and returned today to find her in worse shape. The patient adds no useful history. She appears somnolent and what little speech she is able to express is incoherent.  Patient is a 55 y.o. female presenting with altered mental status. The history is provided by the patient.  Altered Mental Status Presenting symptoms: confusion and lethargy   Severity:  Severe Most recent episode:  Yesterday Episode history:  Continuous Timing:  Constant Progression:  Worsening Chronicity:  New   Past Medical History  Diagnosis Date  . Diabetes mellitus without complication ([place])   . Multiple sclerosis diagnosed 2002-not on Therapy any longer 06/26/2012  . Stage 4 decubitus ulcer ([place]) 07/05/2015  . Acute renal failure superimposed on stage 3 chronic kidney disease ([place]) 07/21/2015  . Malnutrition of moderate degree 07/21/2015  . UTI (lower urinary tract infection)   . DVT (deep venous thrombosis) [facility])    Past Surgical History  Procedure Laterality Date  . Hernia mesh removal    . Tubes tided    . Tubal ligation    . Esophagogastroduodenoscopy Left 01/02/2015    Procedure: ESOPHAGOGASTRODUODENOSCOPY (EGD);  Surgeon: Arta Silence, MD;  Location: [facility];  Service: Endoscopy;  Laterality: Left;   Family History  Problem Relation Age of Onset  . Diabetes  Mother   . Diabetes Father   . Diabetes Sister   . Scoliosis Sister   . Alzheimer's disease Mother    Social History  Substance Use Topics  . Smoking status: Former Smoker -- 1.00 packs/day for 20 years    Types: Cigarettes    Quit date: 11/19/2014  . Smokeless tobacco: Never Used  . Alcohol Use: 3.0 oz/week    5 Glasses of wine per week   OB History    No data available     Review of Systems  Unable to perform ROS: Acuity of condition  Psychiatric/Behavioral: Positive for confusion.      Allergies  Review of patient's allergies indicates no known allergies.  Home Medications   Prior to Admission medications   Medication Sig Start Date End Date Taking? Authorizing Provider  Acetaminophen (APAP) 325 MG tablet Take 650 mg by mouth every 6 (six) hours as needed for pain.    Historical Provider, MD  albuterol (PROVENTIL HFA;VENTOLIN HFA) 108 (90 BASE) MCG/ACT inhaler Inhale 2 puffs into the lungs every 4 (four) hours as needed for wheezing or shortness of breath. 06/29/12   [name], MD  Amino Acids-Protein Hydrolys (FEEDING SUPPLEMENT, PRO-STAT SUGAR FREE 64,) LIQD Take 30 mLs by mouth 2 (two) times daily. 11/18/15   Cherene Altes, MD  baclofen (LIORESAL) 10 MG tablet Take 1 tablet (10 mg total) by mouth 3 (three) times daily. 11/18/15   Cherene Altes, MD  famotidine (PEPCID) 20 MG tablet Take 1 tablet (20 mg total) by mouth 2 (two) times daily. 11/18/15   Kimberlee Nearing  Thereasa Solo, MD  furosemide (LASIX) 40 MG tablet Take 1 tablet (40 mg total) by mouth daily as needed for fluid or edema. Reported on 07/05/2015 11/18/15   Cherene Altes, MD  gabapentin (NEURONTIN) 100 MG capsule Take 1 capsule (100 mg total) by mouth at bedtime. 11/18/15   Cherene Altes, MD  HYDROcodone-acetaminophen (NORCO) 5-325 MG tablet Take 1 tablet by mouth every 6 (six) hours as needed for moderate pain. 11/18/15   Cherene Altes, MD  metFORMIN (GLUCOPHAGE) 500 MG tablet Take 1 tablet (500 mg total) by  mouth 2 (two) times daily. 11/18/15   Cherene Altes, MD  Multiple Vitamin (MULTIVITAMIN WITH MINERALS) TABS tablet Take 1 tablet by mouth daily. 07/09/15   Florencia Reasons, MD  Omega 3 1000 MG CAPS Take 1,000 mg by mouth daily.    Historical Provider, MD  polyethylene glycol (MIRALAX / GLYCOLAX) packet Take 17 g by mouth 2 (two) times daily. 11/18/15   Cherene Altes, MD  pravastatin (PRAVACHOL) 20 MG tablet Take 1 tablet (20 mg total) by mouth daily at 6 PM. 11/18/15   Cherene Altes, MD  rivaroxaban (XARELTO) 20 MG TABS tablet Take 1 tablet (20 mg total) by mouth daily with supper. Reported on 08/12/2015 11/18/15   Cherene Altes, MD  solifenacin (VESICARE) 5 MG tablet Take 1 tablet (5 mg total) by mouth daily. 11/18/15   Cherene Altes, MD  sulfamethoxazole-trimethoprim (BACTRIM DS,SEPTRA DS) 800-160 MG tablet Take 1 tablet by mouth every 12 (twelve) hours. 11/18/15   Cherene Altes, MD  vitamin C (ASCORBIC ACID) 500 MG tablet Take 500 mg by mouth daily.    Historical Provider, MD   SpO2 99% Physical Exam  Constitutional:  The patient is an acutely on chronically ill appearing 55 year old female. She is somewhat cachectic.  HENT:  Head: Normocephalic and atraumatic.  Neck: Normal range of motion. Neck supple.  Cardiovascular: Regular rhythm.   No murmur heard. Heart is tachycardic  Pulmonary/Chest: Effort normal and breath sounds normal. No respiratory distress. She has no wheezes.  Abdominal: Soft. Bowel sounds are normal.  Musculoskeletal:  Her ankles and feet are in braces. There is bilateral foot drop noted.  Neurological:  Patient is somnolent, but arousable. She responds with incomprehensible, garbled speech. She does move her upper extremities when stimulated.  Skin: Skin is warm and dry.  Nursing note and vitals reviewed.   ED Course  Procedures (including critical care time) Labs Review Labs Reviewed  COMPREHENSIVE METABOLIC PANEL  BRAIN NATRIURETIC PEPTIDE  CBC WITH  DIFFERENTIAL/PLATELET  I-STAT TROPOININ, ED  I-STAT CG4 LACTIC ACID, ED    Imaging Review No results found. I have personally reviewed and evaluated these images and lab results as part of my medical decision-making.   EKG Interpretation   Date/Time:  Friday December 18 2015 10:29:22 EDT Ventricular Rate:  118 PR Interval:    QRS Duration: 77 QT Interval:  309 QTC Calculation: 433 R Axis:   82 Text Interpretation:  Sinus tachycardia Probable inferior infarct, old  Lateral leads are also involved Confirmed by Cortney Beissel  MD, Birgitta Uhlir ([id number]) on  12/18/2015 10:46:34 AM      MDM   Final diagnoses:  None    Patient is a chronically ill appearing patient with multiple sclerosis who currently lives at home. She was found by a neighbor 2 days in a row to be less responsive. After the second visit to the home, EMS was called and the patient was found  to be somnolent and disoriented. She was also hypotensive and IV fluids were administered. Workup in the ER reveals a urinary tract infection related to an indwelling Foley catheter, worsening renal function, and elevated WBC. She was given vancomycin and Zosyn and will be admitted to the hospitalist service.    Veryl Speak, MD 12/18/15 1525

## 2015-12-18 NOTE — ED Notes (Signed)
Removed old catheter 12/18/15 @ 11:00am. Placed new catheter 12/18/15 @ 11:13am Marya Amsler, RN helped assist.

## 2015-12-18 NOTE — ED Notes (Signed)
Asked Doctor regarding code sepsis.

## 2015-12-18 NOTE — Progress Notes (Signed)
Pharmacy Antibiotic Note  [name] is a 55 y.o. female admitted on 12/18/2015 with sepsis.  Pharmacy has been consulted for Zosyn and vancomycin dosing.  Day #1 of abx for sepsis. Given vanc 1g and Zosyn x 1 earlier today. Afebrile, WBC elevated at 14.2. SCr 3.3, CrCL ~42ml/min.  Plan: Start Zosyn 2.25g IV Q8 Continue vancomycin 1g IV Q48 Monitor clinical picture, renal function, VT at Css F/U C&S, abx deescalation / LOT   Height: 5\' 3"  (160 cm) Weight: 140 lb 3.4 oz (63.6 kg) IBW/kg (Calculated) : 52.4  Temp (24hrs), Avg:98.7 F (37.1 C), Min:97.6 F (36.4 C), Max:99.8 F (37.7 C)   Recent Labs Lab 12/18/15 1035 12/18/15 1049  WBC 14.2*  --   CREATININE 3.32*  --   LATICACIDVEN  --  2.46*    Estimated Creatinine Clearance: 16.8 mL/min (by C-G formula based on Cr of 3.32).    No Known Allergies  Antimicrobials this admission: Zosyn  7/7 >>  Vancomycin 7/7 >>   Dose adjustments this admission: n/a  Microbiology results: 7/7 BCx: sent 7/7 UCx: sent  7/7 Sputum: no result  7/7 MRSA PCR: sent  Thank you for allowing pharmacy to be a part of this patient's care.  Elenor Quinones, PharmD, BCPS Clinical Pharmacist Pager [phone] 12/18/2015 3:23 PM

## 2015-12-18 NOTE — ED Notes (Signed)
Notified Doctor lactic acid 2.46 Asked if want to call code sepsis, not at this time.

## 2015-12-18 NOTE — ED Notes (Signed)
Patient arrived with foley 16 F in place leaking with cloudy yellow urine.

## 2015-12-18 NOTE — ED Notes (Signed)
Spoke with Doctor regarding blood cultures and urine culture

## 2015-12-18 NOTE — ED Notes (Signed)
Nurse returned from [place].

## 2015-12-18 NOTE — ED Notes (Signed)
Admit Doctor stated patient able to go to Step Down

## 2015-12-18 NOTE — ED Notes (Signed)
Transported patient to CT by Nurse and CT transport

## 2015-12-18 NOTE — ED Notes (Signed)
Admit Doctor at bedside.  

## 2015-12-19 DIAGNOSIS — I480 Paroxysmal atrial fibrillation: Secondary | ICD-10-CM

## 2015-12-19 DIAGNOSIS — N3000 Acute cystitis without hematuria: Secondary | ICD-10-CM

## 2015-12-19 LAB — CBC
HCT: 31 % — ABNORMAL LOW (ref 36.0–46.0)
Hemoglobin: 9.7 g/dL — ABNORMAL LOW (ref 12.0–15.0)
MCH: 27 pg (ref 26.0–34.0)
MCHC: 31.3 g/dL (ref 30.0–36.0)
MCV: 86.4 fL (ref 78.0–100.0)
Platelets: 360 10*3/uL (ref 150–400)
RBC: 3.59 MIL/uL — ABNORMAL LOW (ref 3.87–5.11)
RDW: 19.6 % — ABNORMAL HIGH (ref 11.5–15.5)
WBC: 14.8 10*3/uL — ABNORMAL HIGH (ref 4.0–10.5)

## 2015-12-19 LAB — GLUCOSE, CAPILLARY
Glucose-Capillary: 105 mg/dL — ABNORMAL HIGH (ref 65–99)
Glucose-Capillary: 114 mg/dL — ABNORMAL HIGH (ref 65–99)
Glucose-Capillary: 122 mg/dL — ABNORMAL HIGH (ref 65–99)
Glucose-Capillary: 129 mg/dL — ABNORMAL HIGH (ref 65–99)
Glucose-Capillary: 137 mg/dL — ABNORMAL HIGH (ref 65–99)

## 2015-12-19 LAB — BLOOD CULTURE ID PANEL (REFLEXED)
ACINETOBACTER BAUMANNII: NOT DETECTED
Acinetobacter baumannii: NOT DETECTED
CANDIDA KRUSEI: NOT DETECTED
CARBAPENEM RESISTANCE: NOT DETECTED
Candida albicans: NOT DETECTED
Candida albicans: NOT DETECTED
Candida glabrata: NOT DETECTED
Candida glabrata: NOT DETECTED
Candida krusei: NOT DETECTED
Candida parapsilosis: NOT DETECTED
Candida parapsilosis: NOT DETECTED
Candida tropicalis: NOT DETECTED
Candida tropicalis: NOT DETECTED
Carbapenem resistance: NOT DETECTED
ENTEROBACTERIACEAE SPECIES: NOT DETECTED
ENTEROCOCCUS SPECIES: NOT DETECTED
Enterobacter cloacae complex: NOT DETECTED
Enterobacter cloacae complex: NOT DETECTED
Enterobacteriaceae species: NOT DETECTED
Enterococcus species: NOT DETECTED
Escherichia coli: NOT DETECTED
Escherichia coli: NOT DETECTED
Haemophilus influenzae: NOT DETECTED
Haemophilus influenzae: NOT DETECTED
KLEBSIELLA OXYTOCA: NOT DETECTED
Klebsiella oxytoca: NOT DETECTED
Klebsiella pneumoniae: NOT DETECTED
Klebsiella pneumoniae: NOT DETECTED
Listeria monocytogenes: NOT DETECTED
Listeria monocytogenes: NOT DETECTED
Methicillin resistance: DETECTED — AB
Methicillin resistance: NOT DETECTED
NEISSERIA MENINGITIDIS: NOT DETECTED
Neisseria meningitidis: NOT DETECTED
PSEUDOMONAS AERUGINOSA: NOT DETECTED
Proteus species: NOT DETECTED
Proteus species: NOT DETECTED
Pseudomonas aeruginosa: NOT DETECTED
STAPHYLOCOCCUS AUREUS BCID: NOT DETECTED
STAPHYLOCOCCUS SPECIES: DETECTED — AB
STREPTOCOCCUS AGALACTIAE: NOT DETECTED
STREPTOCOCCUS SPECIES: NOT DETECTED
Serratia marcescens: NOT DETECTED
Serratia marcescens: NOT DETECTED
Staphylococcus aureus (BCID): NOT DETECTED
Staphylococcus species: NOT DETECTED
Streptococcus agalactiae: NOT DETECTED
Streptococcus pneumoniae: NOT DETECTED
Streptococcus pneumoniae: NOT DETECTED
Streptococcus pyogenes: NOT DETECTED
Streptococcus pyogenes: NOT DETECTED
Streptococcus species: NOT DETECTED
VANCOMYCIN RESISTANCE: NOT DETECTED
Vancomycin resistance: NOT DETECTED

## 2015-12-19 LAB — SODIUM, URINE, RANDOM: Sodium, Ur: 39 mmol/L

## 2015-12-19 LAB — COMPREHENSIVE METABOLIC PANEL
ALT: 7 U/L — ABNORMAL LOW (ref 14–54)
AST: 12 U/L — ABNORMAL LOW (ref 15–41)
Albumin: 2.3 g/dL — ABNORMAL LOW (ref 3.5–5.0)
Alkaline Phosphatase: 50 U/L (ref 38–126)
Anion gap: 6 (ref 5–15)
BUN: 40 mg/dL — ABNORMAL HIGH (ref 6–20)
CO2: 21 mmol/L — ABNORMAL LOW (ref 22–32)
Calcium: 9.2 mg/dL (ref 8.9–10.3)
Chloride: 122 mmol/L — ABNORMAL HIGH (ref 101–111)
Creatinine, Ser: 1.48 mg/dL — ABNORMAL HIGH (ref 0.44–1.00)
GFR calc Af Amer: 44 mL/min — ABNORMAL LOW (ref >60)
GFR calc non Af Amer: 38 mL/min — ABNORMAL LOW (ref >60)
Glucose, Bld: 108 mg/dL — ABNORMAL HIGH (ref 65–99)
Potassium: 3.3 mmol/L — ABNORMAL LOW (ref 3.5–5.1)
Sodium: 149 mmol/L — ABNORMAL HIGH (ref 135–145)
Total Bilirubin: 0.5 mg/dL (ref 0.3–1.2)
Total Protein: 6.6 g/dL (ref 6.5–8.1)

## 2015-12-19 LAB — PROTEIN, URINE, RANDOM: Total Protein, Urine: 148 mg/dL

## 2015-12-19 LAB — CREATININE, URINE, RANDOM: Creatinine, Urine: 138.11 mg/dL

## 2015-12-19 LAB — LACTIC ACID, PLASMA
LACTIC ACID, VENOUS: 2.1 mmol/L — AB (ref 0.5–1.9)
LACTIC ACID, VENOUS: 2.1 mmol/L — AB (ref 0.5–1.9)
Lactic Acid, Venous: 2.4 mmol/L (ref 0.5–1.9)

## 2015-12-19 MED ORDER — VANCOMYCIN HCL IN DEXTROSE 750-5 MG/150ML-% IV SOLN
750.0000 mg | INTRAVENOUS | Status: DC
  Administered 2015-12-19: 750 mg via INTRAVENOUS
  Filled 2015-12-19 (×2): qty 150

## 2015-12-19 MED ORDER — RIVAROXABAN 15 MG PO TABS
15.0000 mg | ORAL_TABLET | Freq: Every day | ORAL | Status: DC
  Administered 2015-12-19: 15 mg via ORAL
  Filled 2015-12-19: qty 1

## 2015-12-19 MED ORDER — PIPERACILLIN-TAZOBACTAM 3.375 G IVPB
3.3750 g | Freq: Three times a day (TID) | INTRAVENOUS | Status: DC
Start: 2015-12-19 — End: 2015-12-22
  Administered 2015-12-19 – 2015-12-22 (×9): 3.375 g via INTRAVENOUS
  Filled 2015-12-19 (×14): qty 50

## 2015-12-19 NOTE — Progress Notes (Addendum)
Patient ID: [name], female   DOB: Feb 15, 1961, 55 y.o.   MRN: [id number]    PROGRESS NOTE    [name]  WUJ:[id number] DOB: May 19, 1961 DOA: 12/18/2015  PCP: unknown    Brief Narrative:  55 year old Female, chronically ill looking, with history of MS, decubitus ulcers, documented AKI and CKD, DVT, CVA and malnutrition. Patient lives at home, but has neighbor and Home Health checking on her. Patient was not able to provide any history. On presentation to the hospital, UA suggestive of UTI, and CBC reveals elevated WBC with bandemia. Lactic acid is elevated, and Scr has risen from 0.54 to 3.32. Patient was on Metformin, bactrim, PRN lasix and Xarelto 20 MG po once daily prior to current admission.   Assessment & Plan:   Active Problems: Sepsis - pt met criteria for sepsis on admission with HR > 90 and WBC 14 K, source likely UTI and ? Bacteremia, elevated lactic acid  - preliminary urine culture with g-rods and blood culture with g+ cocci - continue vanc and zosyn day #2 for now - keep in SDU  - repeat lactic acid in AM  Acute metabolic encephalopathy - secondary to the above imposed on dehydration, hypernatremia  - still confused, unknown baseline - asked RN to page me when family arrives - some ? 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  Objective: Filed Vitals:   12/19/15 0000 12/19/15 0412 12/19/15 0800 12/19/15 1115  BP: 99/60 94/53 105/61 108/71  Pulse: 105   99  Temp: 98 F (36.7 C) 98.8 F (37.1 C) 98.6 F (37 C) 98.4 F (36.9 C)  TempSrc: Axillary Axillary Axillary Oral  Resp: _0 Height:      Weight:      SpO2: 100% 100% 95% 98%    Intake/Output Summary (Last 24 hours) at 12/19/15 1448 Last data filed at 12/19/15 1400  Gross per 24 hour  Intake   2905 ml  Output   1725 ml  Net   1180 ml   Filed Weights   12/18/15 1031 12/18/15 1448  Weight: 77.111 kg (170 lb) 63.6 kg (140 lb 3.4 oz)    Examination:  General exam: Appears confused, answers only yet to all questions, follows some commands  Respiratory system: Respiratory effort normal. Diminished breath sounds at bases  Cardiovascular system:  RRR. No JVD, murmurs, rubs, gallops or clicks. No pedal edema. Gastrointestinal system: Abdomen is nondistended, soft and nontender.  Central nervous system: Alert but confused    Data Reviewed: I have personally reviewed following  labs and imaging studies  CBC:  Recent Labs Lab 12/18/15 1035 12/19/15 0744  WBC 14.2* 14.8*  NEUTROABS 12.5*  --   HGB 10.6* 9.7*  HCT 32.6* 31.0*  MCV 84.7 86.4  PLT 345 224   Basic Metabolic Panel:  Recent Labs Lab 12/18/15 1035 12/19/15 0744  NA 143 149*  K 4.8 3.3*  CL 116* 122*  CO2 20* 21*  GLUCOSE 198* 108*  BUN 62* 40*  CREATININE 3.32* 1.48*  CALCIUM 9.1 9.2   Liver Function Tests:  Recent Labs Lab 12/18/15 1035 12/19/15 0744  AST 15 12*  ALT 8* 7*  ALKPHOS 40 50  BILITOT 0.7 0.5  PROT 6.6 6.6  ALBUMIN 2.2* 2.3*   CBG:  Recent Labs Lab 12/18/15 1713 12/18/15 2037 12/18/15 2358 12/19/15 0848 12/19/15 1139  GLUCAP 156* 135* 122* 105* 114*   Urine analysis:    Component Value  Date/Time   COLORURINE YELLOW 12/18/2015 1118   APPEARANCEUR TURBID* 12/18/2015 1118   LABSPEC 1.014 12/18/2015 1118   PHURINE 8.0 12/18/2015 1118   GLUCOSEU NEGATIVE 12/18/2015 1118   HGBUR MODERATE* 12/18/2015 1118   BILIRUBINUR NEGATIVE 12/18/2015 1118   KETONESUR NEGATIVE 12/18/2015 1118   PROTEINUR 100* 12/18/2015 1118   UROBILINOGEN 0.2 12/20/2014 1851   NITRITE NEGATIVE 12/18/2015 1118   LEUKOCYTESUR LARGE* 12/18/2015 1118    Recent Results (from the past 240 hour(s))  Blood culture (routine x 2)     Status: None (Preliminary result)   Collection Time: 12/18/15 10:35 AM  Result Value Ref Range Status   Specimen Description BLOOD LEFT HAND  Final   Special Requests BOTTLES DRAWN AEROBIC AND ANAEROBIC  5CC  Final   Culture  Setup Time   Final    GRAM POSITIVE COCCI IN CLUSTERS IN BOTH AEROBIC AND ANAEROBIC BOTTLES CRITICAL RESULT CALLED TO, READ BACK BY AND VERIFIED WITH: J MARKEL,PHARMD AT 8250 12/19/15 BY L BENFIELD    Culture GRAM POSITIVE COCCI  Final   Report Status PENDING  Incomplete  Blood Culture ID Panel (Reflexed)     Status: Abnormal   Collection Time: 12/18/15 10:35 AM  Result Value Ref Range Status   Enterococcus species NOT DETECTED NOT DETECTED Final   Vancomycin resistance NOT DETECTED NOT DETECTED Final   Listeria monocytogenes NOT DETECTED NOT DETECTED Final   Staphylococcus species DETECTED (A) NOT DETECTED Final    Comment: CRITICAL RESULT CALLED TO, READ BACK BY AND VERIFIED WITH: J MARKEL,PHARMD AT 1417 12/19/15 BY L BENFIELD    Staphylococcus aureus NOT DETECTED NOT DETECTED Final   Methicillin resistance DETECTED (A) NOT DETECTED Final    Comment: CRITICAL RESULT CALLED TO, READ BACK BY AND VERIFIED WITH: J MARKEL,PHARMD AT 1417 12/19/15 BY L BENFIELD    Streptococcus species NOT DETECTED NOT DETECTED Final   Streptococcus agalactiae NOT DETECTED NOT DETECTED Final   Streptococcus pneumoniae NOT DETECTED NOT DETECTED Final   Streptococcus pyogenes  NOT DETECTED NOT DETECTED Final   Acinetobacter baumannii NOT DETECTED NOT DETECTED Final   Enterobacteriaceae species NOT DETECTED NOT DETECTED Final   Enterobacter cloacae complex NOT DETECTED NOT DETECTED Final   Escherichia coli NOT DETECTED NOT DETECTED Final   Klebsiella oxytoca NOT DETECTED NOT DETECTED Final   Klebsiella pneumoniae NOT DETECTED NOT DETECTED Final   Proteus species NOT DETECTED NOT DETECTED Final   Serratia marcescens NOT DETECTED NOT DETECTED Final   Carbapenem resistance NOT DETECTED NOT DETECTED Final   Haemophilus influenzae NOT DETECTED NOT DETECTED Final  Neisseria meningitidis NOT DETECTED NOT DETECTED Final   Pseudomonas aeruginosa NOT DETECTED NOT DETECTED Final   Candida albicans NOT DETECTED NOT DETECTED Final   Candida glabrata NOT DETECTED NOT DETECTED Final   Candida krusei NOT DETECTED NOT DETECTED Final   Candida parapsilosis NOT DETECTED NOT DETECTED Final   Candida tropicalis NOT DETECTED NOT DETECTED Final  Urine culture     Status: Abnormal (Preliminary result)   Collection Time: 12/18/15 11:18 AM  Result Value Ref Range Status   Specimen Description URINE, CATHETERIZED  Final   Special Requests Normal  Final   Culture >=100,000 COLONIES/mL GRAM NEGATIVE RODS (A)  Final   Report Status PENDING  Incomplete  Blood culture (routine x 2)     Status: None (Preliminary result)   Collection Time: 12/18/15 12:00 PM  Result Value Ref Range Status   Specimen Description BLOOD RIGHT HAND  Final   Special Requests IN PEDIATRIC BOTTLE  3CC  Final   Culture  Setup Time   Final    GRAM POSITIVE COCCI IN CLUSTERS AEROBIC BOTTLE ONLY CRITICAL RESULT CALLED TO, READ BACK BY AND VERIFIED WITH: J MARKEL,PHARMD AT 1417 12/19/15 BY L BENFIELD    Culture PENDING  Incomplete   Report Status PENDING  Incomplete  MRSA PCR Screening     Status: None   Collection Time: 12/18/15  3:00 PM  Result Value Ref Range Status   MRSA by PCR NEGATIVE NEGATIVE Final     Comment:        The GeneXpert MRSA Assay (FDA approved for NASAL specimens only), is one component of a comprehensive MRSA colonization surveillance program. It is not intended to diagnose MRSA infection nor to guide or monitor treatment for MRSA infections.   Culture, blood (routine x 2)     Status: None (Preliminary result)   Collection Time: 12/18/15  7:57 PM  Result Value Ref Range Status   Specimen Description BLOOD LEFT ANTECUBITAL  Final   Special Requests IN PEDIATRIC BOTTLE  3CC  Final   Culture  Setup Time   Final    GRAM NEGATIVE RODS AEROBIC BOTTLE ONLY Organism ID to follow    Culture PENDING  Incomplete   Report Status PENDING  Incomplete  Culture, blood (routine x 2)     Status: None (Preliminary result)   Collection Time: 12/18/15  8:21 PM  Result Value Ref Range Status   Specimen Description BLOOD RIGHT ANTECUBITAL  Final   Special Requests IN PEDIATRIC BOTTLE  1CC  Final   Culture NO GROWTH < 12 HOURS  Final   Report Status PENDING  Incomplete      Radiology Studies: Ct Head Wo Contrast  12/18/2015  CLINICAL DATA:  Altered mental status. EXAM: CT HEAD WITHOUT CONTRAST TECHNIQUE: Contiguous axial images were obtained from the base of the skull through the vertex without intravenous contrast. COMPARISON:  CT 08/31/2015.  MRI 07/21/2015. FINDINGS: Generalized brain atrophy. Chronic small-vessel ischemic changes of the deep white matter. No sign of acute infarction, mass lesion, hemorrhage, hydrocephalus or extra-axial collection. No calvarial abnormality. Sinuses, middle ears and mastoids are clear. IMPRESSION: No acute finding. Atrophy and chronic small-vessel disease of the white matter. Electronically Signed   By: Nelson Chimes M.D.   On: 12/18/2015 11:50   US Renal  12/18/2015  CLINICAL DATA:  Altered mental status.  UTI. EXAM: RENAL / URINARY TRACT ULTRASOUND COMPLETE COMPARISON:  07/21/2015 FINDINGS: Right Kidney: Length: 14 cm. Mild right hydronephrosis,  similar to 07/21/2015 sonogram and  abdominal CT 12/15/2014. Echogenic cortex Left Kidney: Length: 15.7 cm. Confluent calculi in the renal pelvis with the largest discrete stone measuring 22 mm. There is chronic hydronephrosis. Urinary collecting system debris. A non shadowing rounded echogenic structure in the collecting system superiorly is likely also debris. Lower pole cyst, both hilar and cortical with bilobed appearance, up to 73 mm in diameter and stable/simple. Bladder: History of Foley catheter.  The bladder is collapsed. IMPRESSION: 1. Collecting system debris on the left as seen with infection or hemorrhage. Asymmetric enlargement of the left kidney, a sign seen with pyelonephritis. No indication of abscess. 2. Chronic mild bilateral hydronephrosis. 3. Extensive left nephrolithiasis. 4. Chronically enlarged and echogenic kidneys, favored secondary to patient's diabetes. Electronically Signed   By: Monte Fantasia M.D.   On: 12/18/2015 17:14   Dg Chest Port 1 View  12/18/2015  CLINICAL DATA:  Weakness, altered mental status EXAM: PORTABLE CHEST 1 VIEW COMPARISON:  Chest x-rays dated 11/11/2015 and 12/14/2014. FINDINGS: Heart size is normal. Overall cardiomediastinal silhouette is normal in size and configuration. Study is hypoinspiratory with crowding of the perihilar bronchovascular markings. Given the low lung volumes, lungs appear clear. Questionable mild atelectasis or small pleural effusion at the left costophrenic angle. IMPRESSION: Questionable mild atelectasis or small pleural effusion at the left costophrenic angle. Lungs otherwise clear. No evidence of pneumonia. No large pleural effusion. Heart size normal. Electronically Signed   By: Franki Cabot M.D.   On: 12/18/2015 11:17      Scheduled Meds: . baclofen  10 mg Oral TID  . darifenacin  7.5 mg Oral Daily  . famotidine (PEPCID) IV  20 mg Intravenous Q24H  . feeding supplement (PRO-STAT SUGAR FREE 64)  30 mL Oral BID  . gabapentin   100 mg Oral QHS  . insulin aspart  0-5 Units Subcutaneous QHS  . insulin aspart  0-9 Units Subcutaneous TID WC  . multivitamin with minerals  1 tablet Oral Daily  . piperacillin-tazobactam (ZOSYN)  IV  3.375 g Intravenous Q8H  . pravastatin  20 mg Oral q1800  . rivaroxaban  15 mg Oral Q supper  . vancomycin  750 mg Intravenous Q24H  . vitamin C  500 mg Oral Daily   Continuous Infusions: . sodium chloride 100 mL/hr at 12/19/15 1121     LOS: 1 day    Time spent: 20 minutes    Faye Ramsay, MD [facility] Pager [phone]  If 7PM-7AM, please contact night-coverage [email or web address] Password [email or web address] 12/19/2015, 2:48 PM

## 2015-12-19 NOTE — Progress Notes (Signed)
CRITICAL VALUE ALERT  Critical value received:  Lactic Acid 2.1  Date of notification:  12/19/2015  Time of notification:  [id number]  Critical value read back:Yes  Nurse who received alert:  Leonie Man  MD notified (1st page):  Frederik Pear  Time of first page:  1903  MD notified (2nd page):  Time of second page:  Responding MD:  N/A yet  Time MD responded:  N/A yet

## 2015-12-19 NOTE — Evaluation (Signed)
Clinical/Bedside Swallow Evaluation Patient Details  Name: [name] MRN: [id number] Date of Birth: 03-Dec-1960  Today's Date: 12/19/2015 Time: SLP Start Time (ACUTE ONLY): 0845 SLP Stop Time (ACUTE ONLY): 0904 SLP Time Calculation (min) (ACUTE ONLY): 19 min  Past Medical History:  Past Medical History  Diagnosis Date  . Diabetes mellitus without complication ([place])   . Multiple sclerosis diagnosed 2002-not on Therapy any longer 06/26/2012  . Stage 4 decubitus ulcer ([place]) 07/05/2015  . Acute renal failure superimposed on stage 3 chronic kidney disease ([place]) 07/21/2015  . Malnutrition of moderate degree 07/21/2015  . UTI (lower urinary tract infection)   . DVT (deep venous thrombosis) ([place])   . Stroke [facility])    Past Surgical History:  Past Surgical History  Procedure Laterality Date  . Hernia mesh removal    . Tubes tided    . Tubal ligation    . Esophagogastroduodenoscopy Left 01/02/2015    Procedure: ESOPHAGOGASTRODUODENOSCOPY (EGD);  Surgeon: Arta Silence, MD;  Location: [facility];  Service: Endoscopy;  Laterality: Left;   HPI:  [name] is a 55 y.o. female, chronically ill looking, with history of MS, decubitus ulcers, documented AKI and CKD, UTI, DVT, CVA and malnutrition, admitted on 12/18/2015 with sepsis, UA done suggestive of UTI.    Assessment / Plan / Recommendation Clinical Impression  Patient presents with a cognitively based oral dysphagia characterized by poor sustained attention resulting in oral holding and prolonged mastication of soft/regular texture solids. Pureed solids transited in a timely manner and without overt s/s of aspiration. One episode of coughing noted with thin liquids when consumed via large consecutive sips, otherwise WFL. Will downgrade diet with good potential to re-advance to regular textures with improved mentation. Will f/u.     Aspiration Risk  Mild aspiration risk    Diet Recommendation Dysphagia 1 (Puree);Thin liquid   Liquid  Administration via: Cup;Straw Medication Administration: Whole meds with liquid Supervision: Staff to assist with self feeding Compensations: Slow rate;Small sips/bites Postural Changes: Seated upright at 90 degrees    Other  Recommendations Oral Care Recommendations: Oral care BID   Follow up Recommendations  None    Frequency and Duration min 2x/week  1 week       Prognosis Prognosis for Safe Diet Advancement: Good Barriers to Reach Goals: Cognitive deficits      Swallow Study   General HPI: [name] is a 54 y.o. female, chronically ill looking, with history of MS, decubitus ulcers, documented AKI and CKD, UTI, DVT, CVA and malnutrition, admitted on 12/18/2015 with sepsis, UA done suggestive of UTI.  Type of Study: Bedside Swallow Evaluation Previous Swallow Assessment: 07/2015-mild oral dysphagia due to cognitive impairment  08/2015-normal oropharyngeal swallow Diet Prior to this Study: Regular;Thin liquids Temperature Spikes Noted: No Respiratory Status: Room air History of Recent Intubation: No Behavior/Cognition: Alert;Confused;Requires cueing;Doesn't follow directions Oral Cavity Assessment: Within Functional Limits Oral Care Completed by SLP: No Oral Cavity - Dentition: Poor condition;Missing dentition Vision: Functional for self-feeding Self-Feeding Abilities: Needs assist Patient Positioning: Upright in bed Baseline Vocal Quality: Normal Volitional Cough: Cognitively unable to elicit Volitional Swallow: Unable to elicit    Oral/Motor/Sensory Function Overall Oral Motor/Sensory Function: Within functional limits   Ice Chips Ice chips: Not tested   Thin Liquid Thin Liquid: Within functional limits Presentation: Straw    Nectar Thick Nectar Thick Liquid: Not tested   Honey Thick Honey Thick Liquid: Not tested   Puree Puree: Within functional limits Presentation: Spoon   Solid  GO   Solid: Impaired Presentation: Spoon Oral Phase Impairments: Impaired  mastication Oral Phase Functional Implications: Prolonged oral transit;Oral holding       Patricia Rainwater MA, CCC-SLP [phone]  [name] Meryl 12/19/2015,9:27 AM

## 2015-12-19 NOTE — Progress Notes (Addendum)
Pharmacy Antibiotic Note  [name] is a 55 y.o. female admitted on 12/18/2015 with sepsis.  Pharmacy has been consulted for Zosyn and vancomycin dosing.  Day #2 of abx for sepsis. Afebrile, WBC elevated at 14.8. SCr down to 1.48 (peaked at 3.32) CrCl ~73ml/min.  Plan: Change Zosyn to 3.375g IV Q8 Change vancomycin to 750mg  IV Q24 Monitor clinical picture, renal function, VT at Css F/U C&S, abx deescalation / LOT  Consider repeat blood cx's? Consider stopping vancomycin if CoNS deemed contaminant in blood   Height: 5\' 3"  (160 cm) Weight: 140 lb 3.4 oz (63.6 kg) IBW/kg (Calculated) : 52.4  Temp (24hrs), Avg:98.3 F (36.8 C), Min:97.6 F (36.4 C), Max:98.8 F (37.1 C)   Recent Labs Lab 12/18/15 1035 12/18/15 1049 12/18/15 2014 12/19/15 0008 12/19/15 0744  WBC 14.2*  --   --   --  14.8*  CREATININE 3.32*  --   --   --  1.48*  LATICACIDVEN  --  2.46* 1.1 2.4*  --     Estimated Creatinine Clearance: 37.7 mL/min (by C-G formula based on Cr of 1.48).    No Known Allergies  Antimicrobials this admission: Zosyn  7/7 >>  Vancomycin 7/7 >>   Dose adjustments this admission: n/a  Microbiology results: 7/7 BCx: GNRs and 2/2 CoNS? 7/7 UCx: > 100k of GNRs  7/7 MRSA PCR: negative  Thank you for allowing pharmacy to be a part of this patient's care.  Elenor Quinones, PharmD, BCPS Clinical Pharmacist Pager [phone] 12/19/2015 1:53 PM

## 2015-12-19 NOTE — Progress Notes (Addendum)
CRITICAL VALUE ALERT  Critical value received:  Lactic Acid 2.1  Date of notification:  12/19/2015  Time of notification:  1729  Critical value read back: Yes  Nurse who received alert:  Leonie Man  MD notified (1st page):  Doyle Askew, I  Time of first page:  1730  MD notified (2nd page):  Time of second page:  Responding MD:  Garwin Brothers  Time MD responded:  [phone]

## 2015-12-20 ENCOUNTER — Inpatient Hospital Stay (HOSPITAL_COMMUNITY): Payer: Medicare Other

## 2015-12-20 DIAGNOSIS — R6510 Systemic inflammatory response syndrome (SIRS) of non-infectious origin without acute organ dysfunction: Secondary | ICD-10-CM

## 2015-12-20 DIAGNOSIS — Z515 Encounter for palliative care: Secondary | ICD-10-CM | POA: Diagnosis present

## 2015-12-20 LAB — CBC
HEMATOCRIT: 33.1 % — AB (ref 36.0–46.0)
HEMOGLOBIN: 10.4 g/dL — AB (ref 12.0–15.0)
MCH: 27.2 pg (ref 26.0–34.0)
MCHC: 31.4 g/dL (ref 30.0–36.0)
MCV: 86.6 fL (ref 78.0–100.0)
Platelets: 361 10*3/uL (ref 150–400)
RBC: 3.82 MIL/uL — ABNORMAL LOW (ref 3.87–5.11)
RDW: 19.7 % — AB (ref 11.5–15.5)
WBC: 17.4 10*3/uL — AB (ref 4.0–10.5)

## 2015-12-20 LAB — GLUCOSE, CAPILLARY
Glucose-Capillary: 126 mg/dL — ABNORMAL HIGH (ref 65–99)
Glucose-Capillary: 151 mg/dL — ABNORMAL HIGH (ref 65–99)
Glucose-Capillary: 164 mg/dL — ABNORMAL HIGH (ref 65–99)
Glucose-Capillary: 187 mg/dL — ABNORMAL HIGH (ref 65–99)

## 2015-12-20 LAB — URINE CULTURE
Culture: >=100000 — AB
SPECIAL REQUESTS: NORMAL

## 2015-12-20 LAB — C3 COMPLEMENT: C3 Complement: 121 mg/dL (ref 82–167)

## 2015-12-20 LAB — BASIC METABOLIC PANEL
ANION GAP: 8 (ref 5–15)
BUN: 29 mg/dL — ABNORMAL HIGH (ref 6–20)
CALCIUM: 9.8 mg/dL (ref 8.9–10.3)
CHLORIDE: 120 mmol/L — AB (ref 101–111)
CO2: 21 mmol/L — AB (ref 22–32)
Creatinine, Ser: 0.84 mg/dL (ref 0.44–1.00)
GFR calc Af Amer: >60 mL/min (ref >60)
GFR calc non Af Amer: >60 mL/min (ref >60)
GLUCOSE: 112 mg/dL — AB (ref 65–99)
Potassium: 3.1 mmol/L — ABNORMAL LOW (ref 3.5–5.1)
Sodium: 149 mmol/L — ABNORMAL HIGH (ref 135–145)

## 2015-12-20 LAB — C4 COMPLEMENT: Complement C4, Body Fluid: 34 mg/dL (ref 14–44)

## 2015-12-20 MED ORDER — POLYETHYLENE GLYCOL 3350 17 G PO PACK
17.0000 g | PACK | Freq: Every day | ORAL | Status: DC
  Administered 2015-12-20 – 2015-12-30 (×6): 17 g via ORAL
  Filled 2015-12-20 (×9): qty 1

## 2015-12-20 MED ORDER — VANCOMYCIN HCL IN DEXTROSE 750-5 MG/150ML-% IV SOLN
750.0000 mg | Freq: Two times a day (BID) | INTRAVENOUS | Status: DC
  Administered 2015-12-20 – 2015-12-30 (×20): 750 mg via INTRAVENOUS
  Filled 2015-12-20 (×22): qty 150

## 2015-12-20 MED ORDER — POTASSIUM CHLORIDE 2 MEQ/ML IV SOLN
INTRAVENOUS | Status: DC
  Administered 2015-12-20 – 2015-12-24 (×7): via INTRAVENOUS
  Filled 2015-12-20 (×13): qty 1000

## 2015-12-20 MED ORDER — RIVAROXABAN 20 MG PO TABS
20.0000 mg | ORAL_TABLET | Freq: Every day | ORAL | Status: DC
  Administered 2015-12-20 – 2015-12-23 (×4): 20 mg via ORAL
  Filled 2015-12-20 (×4): qty 1

## 2015-12-20 MED ORDER — ONDANSETRON HCL 4 MG/2ML IJ SOLN
4.0000 mg | Freq: Four times a day (QID) | INTRAMUSCULAR | Status: DC | PRN
  Administered 2015-12-20: 4 mg via INTRAVENOUS
  Filled 2015-12-20: qty 2

## 2015-12-20 MED ORDER — SENNOSIDES-DOCUSATE SODIUM 8.6-50 MG PO TABS
1.0000 | ORAL_TABLET | Freq: Two times a day (BID) | ORAL | Status: DC
  Administered 2015-12-20 – 2015-12-30 (×17): 1 via ORAL
  Filled 2015-12-20 (×18): qty 1

## 2015-12-20 MED ORDER — FAMOTIDINE 20 MG PO TABS
20.0000 mg | ORAL_TABLET | Freq: Every day | ORAL | Status: DC
  Administered 2015-12-21 – 2015-12-30 (×9): 20 mg via ORAL
  Filled 2015-12-20 (×9): qty 1

## 2015-12-20 NOTE — Progress Notes (Signed)
Pt transferred to 5W11 and received by NT.

## 2015-12-20 NOTE — Progress Notes (Signed)
Pharmacy Antibiotic Note  [name] is a 55 y.o. female admitted on 12/18/2015 with sepsis.  Pharmacy has been consulted for Zosyn and vancomycin dosing.  Day #3 of abx for sepsis / bacteremia. Afebrile (99.1), WBC elevated and trending up at 17.4. SCr down to 0.84 (peaked at 3.32) CrCl ~ 65 ml/min. Lytes wnl exc K 3.1.  Plan: Continue Zosyn to 3.375g IV Q8 Change vancomycin to 750mg  IV Q12 Monitor clinical picture, renal function, VT at Css F/U C&S, abx deescalation / LOT  Consider repeat blood cx's? Consider stopping vancomycin if CoNS deemed contaminant in blood  Height: 5\' 3"  (160 cm) Weight: 140 lb 3.4 oz (63.6 kg) IBW/kg (Calculated) : 52.4  Temp (24hrs), Avg:98.3 F (36.8 C), Min:97.5 F (36.4 C), Max:99.1 F (37.3 C)   Recent Labs Lab 12/18/15 1035 12/18/15 1049 12/18/15 2014 12/19/15 0008 12/19/15 0744 12/19/15 1607 12/19/15 1812 12/20/15 0646  WBC 14.2*  --   --   --  14.8*  --   --  17.4*  CREATININE 3.32*  --   --   --  1.48*  --   --  0.84  LATICACIDVEN  --  2.46* 1.1 2.4*  --  2.1* 2.1*  --     Estimated Creatinine Clearance: 66.4 mL/min (by C-G formula based on Cr of 0.84).    No Known Allergies  Antimicrobials this admission: Zosyn  7/7 >>  Vancomycin 7/7 >>   Dose adjustments this admission: N/A  Microbiology results: 7/7 BCx: GNR also with 2/2 CoNS? 7/7 UCx: > 100k of Providencia stuartii (GNR) 7/7 MRSA PCR: negative  Thank you for allowing pharmacy to be a part of this patient's care.  Argie Ramming, PharmD [phone]

## 2015-12-20 NOTE — Consult Note (Signed)
Consultation Note Date: 12/20/2015   Patient Name: [name]  DOB: Nov 16, 1960  MRN: [id number]  Age / Sex: 55 y.o., female  PCP: No primary care provider on file. Referring Physician: Theodis Blaze, MD  Reason for Consultation: Establishing goals of care and Hospice Evaluation  HPI/Patient Profile: 55 y.o. 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 Her lactic acid was 2.1, white blood cell count 17.4. Patient has had frequent admissions since the first of the year for similar presentation: 07/05/2015 through 07/09/2015, 07/20/2015 through 07/23/2015, 08/31/2015 through 09/01/2015, 11/11/2015 through 11/18/2015 and now readmitted on 12/18/2015. Patient had been living in a skilled nursing facility because of poor support at home but shares with me that she found that too depressing and went back to her house. She lives with a person by the name of [name] that she describes as an ex-boyfriend, someone  she has known for 14 years. [name] works third shift and when he is not there is another individual living in the home by the name of [name] who helps her. Patient states that her first cousin, [name] and his wife [name] are her primary healthcare decision makers as well as they manage "her affairs". I was able to reach [name] via phone. She informs me that she and her husband are on a cruise for the next week. She does verify the information about Princes's living situation. She seems to understand the recurrent and ultimate inability to treat her urinary tract infections. She is willing to act as [name]'s official healthcare power of attorney but this  paperwork has not been completed yet. Patient has 3 sisters that are living and are in [facility]. Per Mrs. [name], they are aware of she and her husband's potential involvement and assistance in terms of power of attorney and healthcare power of attorney and are in agreement  Clinical Assessment and Goals of Care: Patient is more alert than on admission however she still looks somnolent. For the most part she is answering questions relevantly incorrectly. She tells me she will not go back to a skilled nursing facility and wishes to return home. Patient is very debilitated. She has to be fed. She is unable to lift her right arm or her legs. She has minimal usage of her left hand and had asked me to lift her hand to place the call bell in her hand. She is total care for all of her ADLs. It is my opinion that despite being more alert and answering her questions relevantly participating in extensive goals of care conversation 4 patient will be very difficult. Likely due to the multiple sclerosis her head CT reflects atrophy and small fat vessel disease. She is quite debilitated, but does verbalize things that she still enjoys such as watching TV and talking  to her friends on the phone. She tells me she would like to take a trip to the beach with her best friend [name]. I did introduce the topical palliative care as well as terms such as DO NOT RESUSCITATE and full code.  Patient can still speak for herself at this point, but urosepsis seems to be happening more frequently and will rapidly need healthcare power of attorney. Mrs. [name] has agreed to act in that capacity. [name] also wishes for Mrs. [name] to be her healthcare power of attorney. Elim verifies that she is unmarried with no biological children. She does have 3 living sisters.    SUMMARY OF RECOMMENDATIONS   Full scope of treatment to continue Patient remains full code Will place order to spiritual care services to perform  healthcare power of attorney naming disease use as her representative. Again this is per patient's request as well as misuse did verbally agree to act in this capacity Will place order to social work to see if there is any more in-home care the patient can receive other than home health that may be more consistent as well as providing more services. Patient is totally dependent for all of her needs Code Status/Advance Care Planning:  Full code    Symptom Management:   Chronic urinary tract infection: Improved in-home services would help avoid readmission for this patient with things just a simple as Foley catheter care, daily flushing, routine change.  Pain: Patient denies pain  Constipation: Last bowel movement reported 12/20/2015 but see this as an ongoing issue for patient as she is bedbound recommend senna 2-4 tablets daily on a scheduled basis  Palliative Prophylaxis:   Bowel Regimen, Frequent Pain Assessment, Oral Care, Palliative Wound Care and Turn Reposition  Additional Recommendations (Limitations, Scope, Preferences):  Full Scope Treatment  Psycho-social/Spiritual:   Desire for further Chaplaincy support:no  Additional Recommendations: Referral to Community Resources   Prognosis:   < 6 months in the setting of multiple sclerosis, urinary retention and recurrent sepsis related to urinary tract infections, 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 I did not share this potential prognosis with patient, however I did state to Mrs. [name] that should patient's goals begin to shift to symptom management and she verbalizes not wanting to return to the hospital that hospice might be a viable alternative for [facility]  Discharge Planning: Home with Home Health      Primary Diagnoses: Present on Admission:  . UTI (urinary tract infection) . SIRS (systemic inflammatory response syndrome) (HCC)  I have reviewed the medical record, interviewed the patient  and family, and examined the patient. The following aspects are pertinent.  Past Medical History  Diagnosis Date  . Diabetes mellitus without complication ([place])   . Multiple sclerosis diagnosed 2002-not on Therapy any longer 06/26/2012  . Stage 4 decubitus ulcer ([place]) 07/05/2015  . Acute renal failure superimposed on stage 3 chronic kidney disease ([place]) 07/21/2015  . Malnutrition of moderate degree 07/21/2015  . UTI (lower urinary tract infection)   . DVT (deep venous thrombosis) ([place])   . Stroke [facility])    Social History   Social History  . Marital Status: Single    Spouse Name: N/A  . Number of Children: N/A  . Years of Education: N/A   Social History Main Topics  . Smoking status: Former Smoker -- 1.00 packs/day for 20 years    Types: Cigarettes    Quit date: 11/19/2014  . Smokeless tobacco: Never  Used  . Alcohol Use: 3.0 oz/week    5 Glasses of wine per week  . Drug Use: No  . Sexual Activity: Not Currently   Other Topics Concern  . 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            Family History  Problem Relation Age of Onset  . Diabetes Mother   . Diabetes Father   . Diabetes Sister   . Scoliosis Sister   . Alzheimer's disease Mother    Scheduled Meds: . baclofen  10 mg Oral TID  . darifenacin  7.5 mg Oral Daily  . famotidine (PEPCID) IV  20 mg Intravenous Q24H  . feeding supplement (PRO-STAT SUGAR FREE 64)  30 mL Oral BID  . gabapentin  100 mg Oral QHS  . insulin aspart  0-5 Units Subcutaneous QHS  . insulin aspart  0-9 Units Subcutaneous TID WC  . multivitamin with minerals  1 tablet Oral Daily  . piperacillin-tazobactam (ZOSYN)  IV  3.375 g Intravenous Q8H  . pravastatin  20 mg Oral q1800  . rivaroxaban  15 mg Oral Q supper  . vancomycin  750 mg Intravenous Q24H  . vitamin C  500 mg Oral Daily   Continuous Infusions: . sodium chloride  75 mL/hr at 12/20/15 0314   PRN Meds:.albuterol, HYDROcodone-acetaminophen, ondansetron (ZOFRAN) IV Medications Prior to Admission:  Prior to Admission medications   Medication Sig Start Date End Date Taking? Authorizing Provider  Acetaminophen (APAP) 325 MG tablet Take 650 mg by mouth every 6 (six) hours as needed for pain.    Historical Provider, MD  albuterol (PROVENTIL HFA;VENTOLIN HFA) 108 (90 BASE) MCG/ACT inhaler Inhale 2 puffs into the lungs every 4 (four) hours as needed for wheezing or shortness of breath. 06/29/12   [name], MD  Amino Acids-Protein Hydrolys (FEEDING SUPPLEMENT, PRO-STAT SUGAR FREE 64,) LIQD Take 30 mLs by mouth 2 (two) times daily. 11/18/15   Cherene Altes, MD  baclofen (LIORESAL) 10 MG tablet Take 1 tablet (10 mg total) by mouth 3 (three) times daily. Patient taking differently: Take 10 mg by mouth 2 (two) times daily.  11/18/15   Cherene Altes, MD  famotidine (PEPCID) 20 MG tablet Take 1 tablet (20 mg total) by mouth 2 (two) times daily. 11/18/15   Cherene Altes, MD  furosemide (LASIX) 40 MG tablet Take 1 tablet (40 mg total) by mouth daily as needed for fluid or edema. Reported on 07/05/2015 Patient taking differently: Take 40 mg by mouth 2 (two) times daily. Reported on 07/05/2015 11/18/15   Cherene Altes, MD  gabapentin (NEURONTIN) 100 MG capsule Take 1 capsule (100 mg total) by mouth at bedtime. 11/18/15   Cherene Altes, MD  glipiZIDE (GLUCOTROL) 5 MG tablet Take 5 mg by mouth 2 (two) times daily. 11/30/15   Historical Provider, MD  HYDROcodone-acetaminophen (NORCO) 5-325 MG tablet Take 1 tablet by mouth every 6 (six) hours as needed for moderate pain. 11/18/15   Cherene Altes, MD  lisinopril (PRINIVIL,ZESTRIL) 2.5 MG tablet Take 2.5 mg by mouth daily. 11/30/15   Historical Provider, MD  lovastatin (MEVACOR) 20 MG tablet Take 20 mg by mouth daily. 11/30/15   Historical Provider, MD  metFORMIN (GLUCOPHAGE) 500 MG tablet Take 1 tablet (500 mg total) by  mouth 2 (two) times daily. 11/18/15   Dellis Filbert  Hennie Duos, MD  Multiple Vitamin (MULTIVITAMIN WITH MINERALS) TABS tablet Take 1 tablet by mouth daily. 07/09/15   Florencia Reasons, MD  Omega 3 1000 MG CAPS Take 1,000 mg by mouth daily.    Historical Provider, MD  polyethylene glycol (MIRALAX / GLYCOLAX) packet Take 17 g by mouth 2 (two) times daily. 11/18/15   Cherene Altes, MD  pravastatin (PRAVACHOL) 20 MG tablet Take 1 tablet (20 mg total) by mouth daily at 6 PM. 11/18/15   Cherene Altes, MD  rivaroxaban (XARELTO) 20 MG TABS tablet Take 1 tablet (20 mg total) by mouth daily with supper. Reported on 08/12/2015 11/18/15   Cherene Altes, MD  solifenacin (VESICARE) 5 MG tablet Take 1 tablet (5 mg total) by mouth daily. 11/18/15   Cherene Altes, MD  sulfamethoxazole-trimethoprim (BACTRIM DS,SEPTRA DS) 800-160 MG tablet Take 1 tablet by mouth every 12 (twelve) hours. 11/18/15   Cherene Altes, MD  vitamin C (ASCORBIC ACID) 500 MG tablet Take 500 mg by mouth daily.    Historical Provider, MD   No Known Allergies Review of Systems  Physical Exam  Vital Signs: BP 113/68 mmHg  Pulse 90  Temp(Src) 98.4 F (36.9 C) (Oral)  Resp 18  Ht 5\' 3"  (1.6 m)  Wt 63.6 kg (140 lb 3.4 oz)  BMI 24.84 kg/m2  SpO2 99% Pain Assessment: No/denies pain       SpO2: SpO2: 99 % O2 Device:SpO2: 99 % O2 Flow Rate: .   IO: Intake/output summary:  Intake/Output Summary (Last 24 hours) at 12/20/15 1112 Last data filed at 12/20/15 0900  Gross per 24 hour  Intake 3398.33 ml  Output   1075 ml  Net 2323.33 ml    LBM: Last BM Date:  (unknown) Baseline Weight: Weight: 77.111 kg (170 lb) Most recent weight: Weight: 63.6 kg (140 lb 3.4 oz)     Palliative Assessment/Data:   Flowsheet Rows        Most Recent Value   Intake Tab    Referral Department  Hospitalist   Unit at Time of Referral  Intermediate Care Unit   Palliative Care Primary Diagnosis  Sepsis/Infectious Disease   Date Notified  12/19/15   Palliative  Care Type  New Palliative care   Reason for referral  Clarify Goals of Care, Advance Care Planning   Date of Admission  12/18/15   Date first seen by Palliative Care  12/20/15   # of days Palliative referral response time  1 Day(s)   # of days IP prior to Palliative referral  1   Clinical Assessment    Palliative Performance Scale Score  30%   Pain Max last 24 hours  0   Pain Min Last 24 hours  0   Dyspnea Max Last 24 Hours  0   Dyspnea Min Last 24 hours  0   Nausea Max Last 24 Hours  0   Nausea Min Last 24 Hours  0   Anxiety Max Last 24 Hours  0   Anxiety Min Last 24 Hours  0   Other Max Last 24 Hours  0   Psychosocial & Spiritual Assessment    Palliative Care Outcomes    Patient/Family meeting held?  Yes   Who was at the meeting?  pt and via phone [name] ( spouse of 1st cousin, [name])   Palliative Care Outcomes  Provided psychosocial or spiritual support, Provided advance care planning      Time In: 1000  Time Out: 1115 Time Total: 75 min Greater than 50%  of this time was spent counseling and coordinating care related to the above assessment and plan. Staffed with Dr. Doyle Askew  Signed by: Dory Horn, NP   Please contact Palliative Medicine Team phone at [phone] for questions and concerns.  For individual provider: See Shea Evans

## 2015-12-20 NOTE — Progress Notes (Signed)
Pt back from Xray. VSS and pt tolerated Xray well.

## 2015-12-20 NOTE — Progress Notes (Signed)
PT Cancellation Note  Patient Details Name: [name] MRN: [id number] DOB: December 23, 1960   Cancelled Treatment:    Reason Eval/Treat Not Completed: Patient declined, no reason specified, pt reports that she does not feel well and does not want to work today but will be willing tomorrow.    [place], [place] 12/20/2015, 9:42 AM

## 2015-12-20 NOTE — Progress Notes (Signed)
Pt taken to Xray for 2 view of abdomen by transporter at 1120. Pt stable and has orders to transfer to Med-Surg floor, so pt taken off telemetry monitoring for transport. [place] [employer] pass completed and Ticket to [employer] and given to transporter.

## 2015-12-20 NOTE — Progress Notes (Addendum)
Patient ID: [name], female   DOB: 1961/02/24, 55 y.o.   MRN: [id number]    PROGRESS NOTE    SHELVY HECKERT  YSH:[id number] DOB: 1961/06/05 DOA: 12/18/2015  PCP: unknown    Brief Narrative:  55 year old Female, chronically ill looking, with history of MS, decubitus ulcers, documented AKI and CKD, DVT, CVA and malnutrition. Patient lives at home, but has neighbor and Home Health checking on her. Patient was not able to provide any history. On presentation to the hospital, UA suggestive of UTI, and CBC reveals elevated WBC with bandemia. Lactic acid is elevated, and Scr has risen from 0.54 to 3.32. Patient was on Metformin, bactrim, PRN lasix and Xarelto 20 MG po once daily prior to current admission.   Assessment & Plan:   Active Problems: Sepsis - pt met criteria for sepsis on admission with HR > 90 and WBC 14 K, source likely UTI, acute left pyelonephritis, ? 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  Objective: Filed Vitals:   12/19/15 2334 12/20/15 0000 12/20/15 0320 12/20/15 0719  BP: 114/68 106/64 126/72 128/69  Pulse: 98  96 90  Temp: 98.3 F (36.8 C)  98.3 F (36.8 C) 98.4 F (36.9 C)  TempSrc: Oral  Oral Oral  Resp: '19 19 22 19  ' Height:      Weight:      SpO2: 98%  98% 99%    Intake/Output Summary (Last 24 hours) at 12/20/15 0725 Last data filed at 12/20/15 0719  Gross per 24 hour  Intake 2863.33 ml  Output   1175 ml  Net 1688.33 ml   Filed Weights   12/18/15 1031 12/18/15 1448  Weight: 77.111 kg (170 lb) 63.6 kg (140 lb 3.4 oz)    Examination:  General exam: Appears confused, answers only yet to all questions, follows some commands  Respiratory system: Respiratory effort normal. Diminished breath sounds at  bases  Cardiovascular system:  RRR. No JVD, murmurs, rubs, gallops or clicks. No pedal edema. Gastrointestinal system: Abdomen is distended, tympanic, slightly tender in epigastric area, diminished bowel sounds  Central nervous system: Alert but confused, strength in upper and lower extremities 2/5, sensation diminished in upper and lower extremities    Data Reviewed: I have personally reviewed following labs and imaging studies  CBC:  Recent Labs Lab 12/18/15 1035 12/19/15 0744  WBC 14.2* 14.8*  NEUTROABS 12.5*  --   HGB 10.6* 9.7*  HCT 32.6* 31.0*  MCV 84.7 86.4  PLT 345 196   Basic Metabolic Panel:  Recent Labs Lab 12/18/15 1035 12/19/15 0744  NA 143 149*  K 4.8 3.3*  CL 116* 122*  CO2 20* 21*  GLUCOSE 198* 108*  BUN 62* 40*    CREATININE 3.32* 1.48*  CALCIUM 9.1 9.2   Liver Function Tests:  Recent Labs Lab 12/18/15 1035 12/19/15 0744  AST 15 12*  ALT 8* 7*  ALKPHOS 40 50  BILITOT 0.7 0.5  PROT 6.6 6.6  ALBUMIN 2.2* 2.3*   CBG:  Recent Labs Lab 12/18/15 2358 12/19/15 0848 12/19/15 1139 12/19/15 1617 12/19/15 2201  GLUCAP 122* 105* 114* 137* 129*   Urine analysis:    Component Value Date/Time   COLORURINE YELLOW 12/18/2015 1118   APPEARANCEUR TURBID* 12/18/2015 1118   LABSPEC 1.014 12/18/2015 1118   PHURINE 8.0 12/18/2015 1118   GLUCOSEU NEGATIVE 12/18/2015 1118   HGBUR MODERATE* 12/18/2015 1118   BILIRUBINUR NEGATIVE 12/18/2015 1118   KETONESUR NEGATIVE 12/18/2015 1118   PROTEINUR 100* 12/18/2015 1118   UROBILINOGEN 0.2 12/20/2014 1851   NITRITE NEGATIVE 12/18/2015 1118   LEUKOCYTESUR LARGE* 12/18/2015 1118    Recent Results (from the past 240 hour(s))  Blood culture (routine x 2)     Status: None (Preliminary result)   Collection Time: 12/18/15 10:35 AM  Result Value Ref Range Status   Specimen Description BLOOD LEFT HAND  Final   Special Requests BOTTLES DRAWN AEROBIC AND ANAEROBIC  5CC  Final   Culture  Setup Time   Final    GRAM POSITIVE COCCI IN CLUSTERS IN BOTH AEROBIC AND ANAEROBIC BOTTLES CRITICAL RESULT CALLED TO, READ BACK BY AND VERIFIED WITH: J MARKEL,PHARMD AT 1417 12/19/15 BY L BENFIELD    Culture GRAM POSITIVE COCCI  Final   Report Status PENDING  Incomplete  Blood Culture ID Panel (Reflexed)     Status: Abnormal   Collection Time: 12/18/15 10:35 AM  Result Value Ref Range Status   Enterococcus species NOT DETECTED NOT DETECTED Final   Vancomycin resistance NOT DETECTED NOT DETECTED Final   Listeria monocytogenes NOT DETECTED NOT DETECTED Final   Staphylococcus species DETECTED (A) NOT DETECTED Final    Comment: CRITICAL RESULT CALLED TO, READ BACK BY AND VERIFIED WITH: J MARKEL,PHARMD AT 1417 12/19/15 BY L BENFIELD    Staphylococcus aureus NOT DETECTED NOT  DETECTED Final   Methicillin resistance DETECTED (A) NOT DETECTED Final    Comment: CRITICAL RESULT CALLED TO, READ BACK BY AND VERIFIED WITH: J MARKEL,PHARMD AT 1417 12/19/15 BY L BENFIELD    Streptococcus species NOT DETECTED NOT DETECTED Final   Streptococcus agalactiae NOT DETECTED NOT DETECTED Final   Streptococcus pneumoniae NOT DETECTED NOT DETECTED Final   Streptococcus pyogenes NOT DETECTED NOT DETECTED Final   Acinetobacter baumannii NOT DETECTED NOT DETECTED Final   Enterobacteriaceae species NOT DETECTED NOT DETECTED Final   Enterobacter cloacae complex NOT DETECTED NOT DETECTED Final   Escherichia  coli NOT DETECTED NOT DETECTED Final   Klebsiella oxytoca NOT DETECTED NOT DETECTED Final   Klebsiella pneumoniae NOT DETECTED NOT DETECTED Final   Proteus species NOT DETECTED NOT DETECTED Final   Serratia marcescens NOT DETECTED NOT DETECTED Final   Carbapenem resistance NOT DETECTED NOT DETECTED Final   Haemophilus influenzae NOT DETECTED NOT DETECTED Final   Neisseria meningitidis NOT DETECTED NOT DETECTED Final   Pseudomonas aeruginosa NOT DETECTED NOT DETECTED Final   Candida albicans NOT DETECTED NOT DETECTED Final   Candida glabrata NOT DETECTED NOT DETECTED Final   Candida krusei NOT DETECTED NOT DETECTED Final   Candida parapsilosis NOT DETECTED NOT DETECTED Final   Candida tropicalis NOT DETECTED NOT DETECTED Final  Urine culture     Status: Abnormal (Preliminary result)   Collection Time: 12/18/15 11:18 AM  Result Value Ref Range Status   Specimen Description URINE, CATHETERIZED  Final   Special Requests Normal  Final   Culture >=100,000 COLONIES/mL GRAM NEGATIVE RODS (A)  Final   Report Status PENDING  Incomplete  Blood culture (routine x 2)     Status: None (Preliminary result)   Collection Time: 12/18/15 12:00 PM  Result Value Ref Range Status   Specimen Description BLOOD RIGHT HAND  Final   Special Requests IN PEDIATRIC BOTTLE  3CC  Final   Culture  Setup  Time   Final    GRAM POSITIVE COCCI IN CLUSTERS AEROBIC BOTTLE ONLY CRITICAL RESULT CALLED TO, READ BACK BY AND VERIFIED WITH: J MARKEL,PHARMD AT 1417 12/19/15 BY L BENFIELD    Culture NO GROWTH 1 DAY  Final   Report Status PENDING  Incomplete  MRSA PCR Screening     Status: None   Collection Time: 12/18/15  3:00 PM  Result Value Ref Range Status   MRSA by PCR NEGATIVE NEGATIVE Final    Comment:        The GeneXpert MRSA Assay (FDA approved for NASAL specimens only), is one component of a comprehensive MRSA colonization surveillance program. It is not intended to diagnose MRSA infection nor to guide or monitor treatment for MRSA infections.   Culture, blood (routine x 2)     Status: None (Preliminary result)   Collection Time: 12/18/15  7:57 PM  Result Value Ref Range Status   Specimen Description BLOOD LEFT ANTECUBITAL  Final   Special Requests IN PEDIATRIC BOTTLE  3CC  Final   Culture  Setup Time   Final    GRAM NEGATIVE RODS AEROBIC BOTTLE ONLY CRITICAL RESULT CALLED TO, READ BACK BY AND VERIFIED WITH: J MARKEL 12/19/15 @ 62 M VESTAL    Culture GRAM NEGATIVE RODS  Final   Report Status PENDING  Incomplete  Blood Culture ID Panel (Reflexed)     Status: None   Collection Time: 12/18/15  7:57 PM  Result Value Ref Range Status   Enterococcus species NOT DETECTED NOT DETECTED Final   Vancomycin resistance NOT DETECTED NOT DETECTED Final   Listeria monocytogenes NOT DETECTED NOT DETECTED Final   Staphylococcus species NOT DETECTED NOT DETECTED Final   Staphylococcus aureus NOT DETECTED NOT DETECTED Final   Methicillin resistance NOT DETECTED NOT DETECTED Final   Streptococcus species NOT DETECTED NOT DETECTED Final   Streptococcus agalactiae NOT DETECTED NOT DETECTED Final   Streptococcus pneumoniae NOT DETECTED NOT DETECTED Final   Streptococcus pyogenes NOT DETECTED NOT DETECTED Final   Acinetobacter baumannii NOT DETECTED NOT DETECTED Final   Enterobacteriaceae species  NOT DETECTED NOT DETECTED Final  Enterobacter cloacae complex NOT DETECTED NOT DETECTED Final   Escherichia coli NOT DETECTED NOT DETECTED Final   Klebsiella oxytoca NOT DETECTED NOT DETECTED Final   Klebsiella pneumoniae NOT DETECTED NOT DETECTED Final   Proteus species NOT DETECTED NOT DETECTED Final   Serratia marcescens NOT DETECTED NOT DETECTED Final   Carbapenem resistance NOT DETECTED NOT DETECTED Final   Haemophilus influenzae NOT DETECTED NOT DETECTED Final   Neisseria meningitidis NOT DETECTED NOT DETECTED Final   Pseudomonas aeruginosa NOT DETECTED NOT DETECTED Final   Candida albicans NOT DETECTED NOT DETECTED Final   Candida glabrata NOT DETECTED NOT DETECTED Final   Candida krusei NOT DETECTED NOT DETECTED Final   Candida parapsilosis NOT DETECTED NOT DETECTED Final   Candida tropicalis NOT DETECTED NOT DETECTED Final  Culture, blood (routine x 2)     Status: None (Preliminary result)   Collection Time: 12/18/15  8:21 PM  Result Value Ref Range Status   Specimen Description BLOOD RIGHT ANTECUBITAL  Final   Special Requests IN PEDIATRIC BOTTLE  1CC  Final   Culture  Setup Time   Final    GRAM POSITIVE COCCI IN CLUSTERS AEROBIC BOTTLE ONLY CRITICAL RESULT CALLED TO, READ BACK BY AND VERIFIED WITH: J MARKEL 12/19/15 @ 33 M VESTAL    Culture GRAM POSITIVE COCCI  Final   Report Status PENDING  Incomplete      Radiology Studies: Ct Head Wo Contrast  12/18/2015  CLINICAL DATA:  Altered mental status. EXAM: CT HEAD WITHOUT CONTRAST TECHNIQUE: Contiguous axial images were obtained from the base of the skull through the vertex without intravenous contrast. COMPARISON:  CT 08/31/2015.  MRI 07/21/2015. FINDINGS: Generalized brain atrophy. Chronic small-vessel ischemic changes of the deep white matter. No sign of acute infarction, mass lesion, hemorrhage, hydrocephalus or extra-axial collection. No calvarial abnormality. Sinuses, middle ears and mastoids are clear. IMPRESSION: No  acute finding. Atrophy and chronic small-vessel disease of the white matter. Electronically Signed   By: Nelson Chimes M.D.   On: 12/18/2015 11:50   US Renal  12/18/2015  CLINICAL DATA:  Altered mental status.  UTI. EXAM: RENAL / URINARY TRACT ULTRASOUND COMPLETE COMPARISON:  07/21/2015 FINDINGS: Right Kidney: Length: 14 cm. Mild right hydronephrosis, similar to 07/21/2015 sonogram and abdominal CT 12/15/2014. Echogenic cortex Left Kidney: Length: 15.7 cm. Confluent calculi in the renal pelvis with the largest discrete stone measuring 22 mm. There is chronic hydronephrosis. Urinary collecting system debris. A non shadowing rounded echogenic structure in the collecting system superiorly is likely also debris. Lower pole cyst, both hilar and cortical with bilobed appearance, up to 73 mm in diameter and stable/simple. Bladder: History of Foley catheter.  The bladder is collapsed. IMPRESSION: 1. Collecting system debris on the left as seen with infection or hemorrhage. Asymmetric enlargement of the left kidney, a sign seen with pyelonephritis. No indication of abscess. 2. Chronic mild bilateral hydronephrosis. 3. Extensive left nephrolithiasis. 4. Chronically enlarged and echogenic kidneys, favored secondary to patient's diabetes. Electronically Signed   By: Monte Fantasia M.D.   On: 12/18/2015 17:14   Dg Chest Port 1 View  12/18/2015  CLINICAL DATA:  Weakness, altered mental status EXAM: PORTABLE CHEST 1 VIEW COMPARISON:  Chest x-rays dated 11/11/2015 and 12/14/2014. FINDINGS: Heart size is normal. Overall cardiomediastinal silhouette is normal in size and configuration. Study is hypoinspiratory with crowding of the perihilar bronchovascular markings. Given the low lung volumes, lungs appear clear. Questionable mild atelectasis or small pleural effusion at the left costophrenic angle. IMPRESSION:  Questionable mild atelectasis or small pleural effusion at the left costophrenic angle. Lungs otherwise clear. No  evidence of pneumonia. No large pleural effusion. Heart size normal. Electronically Signed   By: Franki Cabot M.D.   On: 12/18/2015 11:17    Scheduled Meds: . baclofen  10 mg Oral TID  . darifenacin  7.5 mg Oral Daily  . famotidine (PEPCID) IV  20 mg Intravenous Q24H  . feeding supplement (PRO-STAT SUGAR FREE 64)  30 mL Oral BID  . gabapentin  100 mg Oral QHS  . insulin aspart  0-5 Units Subcutaneous QHS  . insulin aspart  0-9 Units Subcutaneous TID WC  . multivitamin with minerals  1 tablet Oral Daily  . piperacillin-tazobactam (ZOSYN)  IV  3.375 g Intravenous Q8H  . pravastatin  20 mg Oral q1800  . rivaroxaban  15 mg Oral Q supper  . vancomycin  750 mg Intravenous Q24H  . vitamin C  500 mg Oral Daily   Continuous Infusions: . sodium chloride 75 mL/hr at 12/20/15 0314     LOS: 2 days   Time spent: 20 minutes   Faye Ramsay, MD [facility] Pager [phone]  If 7PM-7AM, please contact night-coverage [email or web address] Password [email or web address] 12/20/2015, 7:25 AM

## 2015-12-21 DIAGNOSIS — Z515 Encounter for palliative care: Secondary | ICD-10-CM

## 2015-12-21 LAB — CBC
HCT: 25.8 % — ABNORMAL LOW (ref 36.0–46.0)
HEMOGLOBIN: 8.2 g/dL — AB (ref 12.0–15.0)
MCH: 26.6 pg (ref 26.0–34.0)
MCHC: 31.8 g/dL (ref 30.0–36.0)
MCV: 83.8 fL (ref 78.0–100.0)
PLATELETS: 359 10*3/uL (ref 150–400)
RBC: 3.08 MIL/uL — ABNORMAL LOW (ref 3.87–5.11)
RDW: 19.2 % — AB (ref 11.5–15.5)
WBC: 15.9 10*3/uL — ABNORMAL HIGH (ref 4.0–10.5)

## 2015-12-21 LAB — GLUCOSE, CAPILLARY
Glucose-Capillary: 170 mg/dL — ABNORMAL HIGH (ref 65–99)
Glucose-Capillary: 155 mg/dL — ABNORMAL HIGH (ref 65–99)
Glucose-Capillary: 176 mg/dL — ABNORMAL HIGH (ref 65–99)
Glucose-Capillary: 168 mg/dL — ABNORMAL HIGH (ref 65–99)

## 2015-12-21 LAB — CULTURE, BLOOD (ROUTINE X 2)

## 2015-12-21 LAB — BASIC METABOLIC PANEL
Anion gap: 7 (ref 5–15)
BUN: 19 mg/dL (ref 6–20)
CALCIUM: 9.1 mg/dL (ref 8.9–10.3)
CHLORIDE: 115 mmol/L — AB (ref 101–111)
CO2: 21 mmol/L — ABNORMAL LOW (ref 22–32)
CREATININE: 0.67 mg/dL (ref 0.44–1.00)
GFR calc non Af Amer: >60 mL/min (ref >60)
Glucose, Bld: 184 mg/dL — ABNORMAL HIGH (ref 65–99)
Potassium: 2.9 mmol/L — ABNORMAL LOW (ref 3.5–5.1)
SODIUM: 143 mmol/L (ref 135–145)

## 2015-12-21 LAB — ANTINUCLEAR ANTIBODIES, IFA: ANA Ab, IFA: NEGATIVE

## 2015-12-21 LAB — HEMOGLOBIN A1C
Hgb A1c MFr Bld: 6 % — ABNORMAL HIGH (ref 4.8–5.6)
Mean Plasma Glucose: 126 mg/dL

## 2015-12-21 MED ORDER — ENSURE ENLIVE PO LIQD
237.0000 mL | Freq: Two times a day (BID) | ORAL | Status: DC
Start: 2015-12-22 — End: 2015-12-31
  Administered 2015-12-22 – 2015-12-30 (×14): 237 mL via ORAL

## 2015-12-21 MED ORDER — POTASSIUM CHLORIDE CRYS ER 20 MEQ PO TBCR
40.0000 meq | EXTENDED_RELEASE_TABLET | Freq: Once | ORAL | Status: AC
  Administered 2015-12-21: 40 meq via ORAL
  Filled 2015-12-21: qty 2

## 2015-12-21 NOTE — Evaluation (Signed)
Physical Therapy Evaluation Patient Details Name: [name] MRN: [id number] DOB: Aug 21, 1960 Today's Date: 12/21/2015   History of Present Illness  55 year old Female, chronically ill looking, with history of MS, decubitus ulcers, documented AKI and CKD, UTI, DVT, CVA and malnutrition. Patient lives at home, but has neighbor and Home Health checking on her. Pt found to have UTI, hypotension, elevated lactic acid.  Clinical Impression  Pt had now gone so long without moving that she is profoundly weak and in need of her ex boyfriend and roommate to get her OOB to her power w/c.  PT has nothing acutely to offer her, but may benefit from assist to recliner from the bed.     Follow Up Recommendations      Equipment Recommendations       Recommendations for Other Services       Precautions / Restrictions Restrictions Weight Bearing Restrictions: No      Mobility  Bed Mobility Overal bed mobility: Needs Assistance Bed Mobility: Supine to Sit     Supine to sit: Total assist     General bed mobility comments: pt refused to move to EOB and sit EOB  Transfers Overall transfer level: Needs assistance               General transfer comment: patient deferred  Ambulation/Gait             General Gait Details: not applicable  Stairs            Wheelchair Mobility    Modified Rankin (Stroke Patients Only)       Balance Overall balance assessment:  (unable to pull away from raised HOB in sitting position)                                           Pertinent Vitals/Pain Pain Assessment: Faces Faces Pain Scale: Hurts little more Pain Location: knees/joints during MMT Pain Descriptors / Indicators: Aching;Moaning;Grimacing Pain Intervention(s): Repositioned    Home Living Family/patient expects to be discharged to:: Skilled nursing facility                 Additional Comments: pt reports being bathed laying in bed, lifted to  the power chair by ex-boyfriend and another friend and staying ing the w/c for a short to moderate amount of time. Pt has not sat EOB or in chairs/couch in her den.    Prior Function Level of Independence: Needs assistance   Gait / Transfers Assistance Needed: Lift used at [facility] to assist pt to [facility]  ADL's / Homemaking Assistance Needed: total (A) for all LB adls. Pt able to feed herself with L hand but is R hand dominant now with deficits  Comments: pt not a good historian on PLOF     Hand Dominance   Dominant Hand: Right    Extremity/Trunk Assessment   Upper Extremity Assessment: Generalized weakness;RUE deficits/detail;LUE deficits/detail RUE Deficits / Details: generally weak in should flexion, bicep/tricep presses.     LUE Deficits / Details: functional, the best of all 4 extrmities   Lower Extremity Assessment: RLE deficits/detail;LLE deficits/detail RLE Deficits / Details: no movement noted in gross flexion and extention LLE Deficits / Details: no movement noted     Communication   Communication: No difficulties  Cognition Arousal/Alertness: Awake/alert;Lethargic Behavior During Therapy: Flat affect Overall Cognitive Status: History of cognitive impairments - at  baseline                      General Comments General comments (skin integrity, edema, etc.): pt is scared to work on sitting balance EOB.  She is a total care at home.  We really have nothing to offer Ms Veal.    Exercises Other Exercises Other Exercises: AROM and light resistive ex to UE's and PROM to LE's      Assessment/Plan    PT Assessment Patent does not need any further PT services  PT Diagnosis Difficulty walking;Generalized weakness;Altered mental status   PT Problem List    PT Treatment Interventions     PT Goals (Current goals can be found in the Care Plan section) Acute Rehab PT Goals Patient Stated Goal: pt not really committed to working with physciw PT Goal Formulation:  All assessment and education complete, DC therapy    Frequency     Barriers to discharge        Co-evaluation               End of Session   Activity Tolerance: Patient tolerated treatment well Patient left: in bed;with call bell/phone within reach;with bed alarm set Nurse Communication: Mobility status         Time: [id number] PT Time Calculation (min) (ACUTE ONLY): 30 min   Charges:   PT Evaluation $PT Eval Moderate Complexity: 1 Procedure PT Treatments $Therapeutic Exercise: 8-22 mins   PT G Codes:        Mearle Drew, Tessie Fass 12/21/2015, 3:59 PM 12/21/2015  Donnella Sham, [place] [phone]  (pager)

## 2015-12-21 NOTE — Clinical Documentation Improvement (Signed)
Internal Medicine  Please clarify if you agree with ED documentation and document findings in next progress note if applicable. Thank you!    Sepsis with UTI secondary to indwelling foley catheter  Sepsis with UTI is not related to indwelling foley catheter  Other  Clinically Undetermined  Supporting Information:  "Workup in the ER reveals a urinary tract infection related to an indwelling Foley catheter, worsening renal function, and elevated WBC"  Please exercise your independent, professional judgment when responding. A specific answer is not anticipated or expected.  Thank You, Norm Parcel RN, BSN, [place] Clinical Documentation Specialist [facility] [phone]; cell [phone]

## 2015-12-21 NOTE — Progress Notes (Signed)
Speech Language Pathology Treatment: Dysphagia  Patient Details Name: [name] MRN: [id number] DOB: January 14, 1961 Today's Date: 12/21/2015 Time: [id number] SLP Time Calculation (min) (ACUTE ONLY): 8 min  Assessment / Plan / Recommendation Clinical Impression  Pt alert and provided total assist for po consumption. Masticated small piece of Dys 3 texture without difficulties however increased risk of fatigue over a full meal. No s/s aspiration. Recommend upgrade to Dys 2, thin, straws allowed, check oral cavity for pocketed food. Min verbal reminders given for precautions (check oral cavity for pocketed food). Continue ST for safety.     HPI HPI: [name] is a 55 y.o. female, chronically ill looking, with history of MS, decubitus ulcers, documented AKI and CKD, UTI, DVT, CVA and malnutrition, admitted on 12/18/2015 with sepsis, UA done suggestive of UTI.       SLP Plan  Continue with current plan of care     Recommendations  Diet recommendations: Dysphagia 2 (fine chop);Thin liquid Liquids provided via: Straw;Cup Medication Administration: Whole meds with liquid Supervision: Staff to assist with self feeding;Full supervision/cueing for compensatory strategies Compensations: Slow rate;Small sips/bites;Lingual sweep for clearance of pocketing Postural Changes and/or Swallow Maneuvers: Seated upright 90 degrees             Oral Care Recommendations: Oral care BID Follow up Recommendations: None Plan: Continue with current plan of care                     Houston Siren 12/21/2015, 2:29 PM   Orbie Pyo Colvin Caroli.Ed [employer] [phone]

## 2015-12-21 NOTE — Progress Notes (Signed)
   12/21/15 0900  Clinical Encounter Type  Visited With Patient  Visit Type Follow-up  Referral From Nurse  Consult/Referral To Chaplain  Spiritual Encounters  Spiritual Needs Literature  Stress Factors  Patient Stress Factors Exhausted  Family Stress Factors None identified   Chaplain responded to consult for AD for patient. Chaplain visited patient but patient was sleeping.  Chaplain will follow up with the patient in the afternoon to update paperwork.

## 2015-12-21 NOTE — Clinical Documentation Improvement (Signed)
Internal Medicine  Please clarify the site and stage of the patients pressure ulcers and document findings in next progress note. Thank you!   Present on Admission, not present on admission, unable to determine if present on admission  Site with laterality - Elbow, Back (upper/lower), Sacral, Hip, Buttock, Ankle, Heel, Head  Pressure Ulcer Stage - Stage1, Stage 2, Stage 3, Stage 4, Unstageable  Other  Clinically Undetermined  Supporting Information:  Please exercise your independent, professional judgment when responding. A specific answer is not anticipated or expected.  Thank You,  Norm Parcel RN, BSN, [place] Clinical Documentation Specialist [facility] [phone]; cell [phone]

## 2015-12-21 NOTE — Progress Notes (Signed)
   12/21/15 1400  Clinical Encounter Type  Visited With Patient  Visit Type Follow-up  Referral From Nurse  Consult/Referral To Chaplain  Spiritual Encounters  Spiritual Needs Literature  Stress Factors  Patient Stress Factors Exhausted   Chaplain visited with patient.  Chaplain left AD paperwork with patient. Patient was exhausted and said she would review AD later.   Vilinda Blanks [facility] 12/21/2015 2:37 PM

## 2015-12-21 NOTE — Care Management Important Message (Signed)
Important Message  Patient Details  Name: [name] MRN: [id number] Date of Birth: 06/10/1961   Medicare Important Message Given:  Yes    Loann Quill 12/21/2015, 9:44 AM

## 2015-12-21 NOTE — Progress Notes (Signed)
Initial Nutrition Assessment  DOCUMENTATION CODES:   Not applicable  INTERVENTION:   -Ensure Enlive po BID, each supplement provides 350 kcal and 20 grams of protein -Continue MVI daily -D/c Prostat BID  NUTRITION DIAGNOSIS:   Inadequate oral intake related to poor appetite as evidenced by meal completion < 50%.  GOAL:   Patient will meet greater than or equal to 90% of their needs  MONITOR:   PO intake, Supplement acceptance, Labs, Weight trends, Skin, I & O's  REASON FOR ASSESSMENT:   Low Braden    ASSESSMENT:   55 year old Female, chronically ill looking, with history of MS, decubitus ulcers, documented AKI and CKD, UTI, DVT, CVA and malnutrition. Patient lives at home, but has neighbor and Home Health checking on her.   Pt admitted with SIRS vs UTI/sepsis.   Pt transferred from SDU to medical floor on 12/20/15.   Spoke with pt at bedside. She reports fair appetite both currently and PTA. She reveals that drinks "a lot of Ensure and whole milk". Discussed case with nurse tech at bedside, who reports pt ate "a good portion" of breakfast this morning. Meal completion 15-50%.   Pt underwent BSE on 12/19/15 and was upgraded to dysphagia 1 diet with thin liquids. Pt had repeat BSE this afternoon and pt was upgraded to dysphagia 2 diet with thin liquids. Suspect intake may increase with more food choices.   Nutrition-Focused physical exam completed. Findings are mild fat depletion, mild muscle depletion, and no edema. Fat and muscle depletion found in upper and lower extremities and is likely related to MS.   Reviewed wt hx, which revealed wt gain trend. Pt denies weight loss.   Labs reviewed: CBGS: 164-187.   Diet Order:  DIET DYS 2 Room service appropriate?: Yes; Fluid consistency:: Thin  Skin:  Reviewed, no issues  Last BM:  12/20/15  Height:   Ht Readings from Last 1 Encounters:  12/18/15 5\' 3"  (1.6 m)    Weight:   Wt Readings from Last 1 Encounters:  12/18/15  140 lb 3.4 oz (63.6 kg)    Ideal Body Weight:  52.3 kg  BMI:  Body mass index is 24.84 kg/(m^2).  Estimated Nutritional Needs:   Kcal:  1500-1700  Protein:  70-85 grams  Fluid:  1.5-1.7 L  EDUCATION NEEDS:   Education needs addressed  [place] Hijazi A. Jimmye Norman, RD, LDN, CDE Pager: [phone] After hours Pager: [phone]

## 2015-12-21 NOTE — Progress Notes (Signed)
CSW received consult to see if pt is eligible for additional services at home.  Consult inquired about [facility] and CAPS programs.  P[facility] is adult daycare program- pt is total care at this time and would not be appropriate for this level of care  Pt is not eligible for CAPS program since she does not have Medicaid.  RNCM aware of pt case and is looking into other home options- per palliative note and previous encounter with patient during previous admission pt is not agreeable to returning to SNF- pt is also no longer appropriate for short term SNF since pt is at baseline and unable to participate with therapies  CSW signing off at this time- please reconsult if needed  Domenica Reamer, [place] Worker [phone]

## 2015-12-21 NOTE — Progress Notes (Signed)
OT Cancellation Note and Discharge  Patient Details Name: SALIYAH AFOLABI MRN: [id number] DOB: Jan 30, 1961   Cancelled Treatment:    Reason Eval/Treat Not Completed: OT screened, no needs identified, will sign off. Per Pallitive note pt total care for basic ADLs including feeding--due to this no OT needs identified, we will sign off.  Almon Register [id number] 12/21/2015, 7:48 AM

## 2015-12-21 NOTE — Progress Notes (Addendum)
Patient ID: [name], female   DOB: 11/13/1960, 55 y.o.   MRN: [id number]    PROGRESS NOTE    [name]  [id number] DOB: 1960/10/12 DOA: 12/18/2015  PCP: unknown    Brief Narrative:  55 year old Female, chronically ill looking, with history of MS, decubitus ulcers, documented AKI and CKD, DVT, CVA and malnutrition. Patient lives at home, but has neighbor and Home Health checking on her. Patient was not able to provide any history. On presentation to the hospital, UA suggestive of UTI, and CBC reveals elevated WBC with bandemia. Lactic acid is elevated, and Scr has risen from 0.54 to 3.32. Patient was on Metformin, bactrim, PRN lasix and Xarelto 20 MG po once daily prior to current admission.   Assessment & Plan:   Active Problems: Sepsis secondary to UTI from indwelling foley cath and left pyelonephritis  - pt met criteria for sepsis on admission with HR > 90 and WBC 14 K, source likely UTI, acute left pyelonephritis, ? 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 with baseline Cr ~1.9 in the past month  - pre renal in etiology - responding to IVF - Cr is trending down and is WNL this AM - BMP in AM  Distended abd, adynamic Ileus - XRAY confirmed but SBO can not be ruled out - CT abd and pelvis requested as recommended for further evaluation  - continue with bowel regimen   Hypokalemia - continue to supplement  - BMP In AM  Hypernatremia -  changed fluids to D5 - Na is now WNL - BMP in AM  Anemia of chronic disease  - no signs of bleeding but Hg drop noted in the past 24 hours  - CBC in AM  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  Objective: Filed Vitals:   12/20/15 1200 12/20/15 1317 12/20/15 2121 12/21/15 0620  BP: 125/71 124/66 129/63 111/64  Pulse: 81 83 86 80  Temp: 99.1 F (37.3 C) 97.5 F (36.4 C) 98.7 F (37.1 C) 98.6 F (37 C)  TempSrc: Oral     Resp: '19 19 18 18  ' Height:      Weight:      SpO2: 99% 97% 100% 100%    Intake/Output Summary (Last 24 hours) at 12/21/15 1414 Last data filed at 12/21/15 1150  Gross per 24 hour  Intake   1826 ml  Output   1750 ml  Net  76 ml   Filed Weights   12/18/15 1031 12/18/15 1448  Weight: 77.111 kg (170 lb) 63.6 kg (140 lb 3.4 oz)    Examination:  General exam: Appears confused, answers only yet to all questions, follows some commands  Respiratory system: Respiratory effort normal. Diminished breath sounds at bases  Cardiovascular system:  RRR. No JVD, murmurs, rubs, gallops or clicks. No pedal edema. Gastrointestinal system: Abdomen is distended, tympanic, slightly tender in epigastric area, diminished bowel sounds  Central nervous system: Alert but confused, strength in upper and lower extremities 2/5, sensation diminished in upper and lower extremities    Data Reviewed: I have personally reviewed following labs and imaging studies  CBC:  Recent Labs Lab 12/18/15 1035  12/19/15 0744 12/20/15 0646 12/21/15 0559  WBC 14.2* 14.8* 17.4* 15.9*  NEUTROABS 12.5*  --   --   --   HGB 10.6* 9.7* 10.4* 8.2*  HCT 32.6* 31.0* 33.1* 25.8*  MCV 84.7 86.4 86.6 83.8  PLT 345 360 361 720   Basic Metabolic Panel:  Recent Labs Lab 12/18/15 1035 12/19/15 0744 12/20/15 0646 12/21/15 0559  NA 143 149* 149* 143  K 4.8 3.3* 3.1* 2.9*  CL 116* 122* 120* 115*  CO2 20* 21* 21* 21*  GLUCOSE 198* 108* 112* 184*  BUN 62* 40* 29* 19  CREATININE 3.32* 1.48* 0.84 0.67  CALCIUM 9.1 9.2 9.8 9.1   Liver Function Tests:  Recent Labs Lab 12/18/15 1035 12/19/15 0744  AST 15 12*  ALT 8* 7*  ALKPHOS 40 50  BILITOT 0.7 0.5  PROT 6.6 6.6  ALBUMIN 2.2* 2.3*   CBG:  Recent Labs Lab 12/20/15 1221 12/20/15 1633 12/20/15 2118 12/21/15 0819 12/21/15 1209  GLUCAP 151* 164* 187* 170* 155*   Urine analysis:    Component Value Date/Time   COLORURINE YELLOW 12/18/2015 1118   APPEARANCEUR TURBID* 12/18/2015 1118   LABSPEC 1.014 12/18/2015 1118   PHURINE 8.0 12/18/2015 1118   GLUCOSEU NEGATIVE 12/18/2015 1118   HGBUR MODERATE* 12/18/2015 1118   BILIRUBINUR NEGATIVE 12/18/2015 1118   KETONESUR NEGATIVE 12/18/2015 1118   PROTEINUR 100* 12/18/2015 1118   UROBILINOGEN 0.2 12/20/2014 1851   NITRITE NEGATIVE 12/18/2015 1118   LEUKOCYTESUR LARGE* 12/18/2015 1118    Recent Results (from the past 240 hour(s))  Blood culture (routine x 2)     Status: Abnormal   Collection Time: 12/18/15 10:35 AM  Result Value Ref Range Status   Specimen Description BLOOD LEFT HAND  Final   Special Requests BOTTLES DRAWN AEROBIC AND ANAEROBIC  5CC  Final   Culture  Setup Time   Final    GRAM POSITIVE COCCI IN CLUSTERS IN BOTH AEROBIC AND ANAEROBIC BOTTLES CRITICAL RESULT CALLED TO, READ BACK BY AND VERIFIED WITH: J MARKEL,PHARMD AT 1417 12/19/15 BY L BENFIELD    Culture STAPHYLOCOCCUS SPECIES (COAGULASE NEGATIVE) (A)  Final   Report Status 12/21/2015 FINAL  Final   Organism ID,  Bacteria STAPHYLOCOCCUS SPECIES (COAGULASE NEGATIVE)  Final      Susceptibility   Staphylococcus species (coagulase negative) - MIC*    CIPROFLOXACIN 1 SENSITIVE Sensitive     ERYTHROMYCIN <=0.25 SENSITIVE Sensitive     GENTAMICIN <=0.5 SENSITIVE Sensitive     OXACILLIN 0.5 RESISTANT Resistant     TETRACYCLINE <=1 SENSITIVE Sensitive     VANCOMYCIN 1 SENSITIVE Sensitive     TRIMETH/SULFA 20 SENSITIVE Sensitive     CLINDAMYCIN <=0.25 SENSITIVE Sensitive     RIFAMPIN <=0.5 SENSITIVE Sensitive  Inducible Clindamycin NEGATIVE Sensitive     * STAPHYLOCOCCUS SPECIES (COAGULASE NEGATIVE)  Blood Culture ID Panel (Reflexed)     Status: Abnormal   Collection Time: 12/18/15 10:35 AM  Result Value Ref Range Status   Enterococcus species NOT DETECTED NOT DETECTED Final   Vancomycin resistance NOT DETECTED NOT DETECTED Final   Listeria monocytogenes NOT DETECTED NOT DETECTED Final   Staphylococcus species DETECTED (A) NOT DETECTED Final    Comment: CRITICAL RESULT CALLED TO, READ BACK BY AND VERIFIED WITH: J MARKEL,PHARMD AT 1417 12/19/15 BY L BENFIELD    Staphylococcus aureus NOT DETECTED NOT DETECTED Final   Methicillin resistance DETECTED (A) NOT DETECTED Final    Comment: CRITICAL RESULT CALLED TO, READ BACK BY AND VERIFIED WITH: J MARKEL,PHARMD AT 1417 12/19/15 BY L BENFIELD    Streptococcus species NOT DETECTED NOT DETECTED Final   Streptococcus agalactiae NOT DETECTED NOT DETECTED Final   Streptococcus pneumoniae NOT DETECTED NOT DETECTED Final   Streptococcus pyogenes NOT DETECTED NOT DETECTED Final   Acinetobacter baumannii NOT DETECTED NOT DETECTED Final   Enterobacteriaceae species NOT DETECTED NOT DETECTED Final   Enterobacter cloacae complex NOT DETECTED NOT DETECTED Final   Escherichia coli NOT DETECTED NOT DETECTED Final   Klebsiella oxytoca NOT DETECTED NOT DETECTED Final   Klebsiella pneumoniae NOT DETECTED NOT DETECTED Final   Proteus species NOT DETECTED NOT DETECTED Final    Serratia marcescens NOT DETECTED NOT DETECTED Final   Carbapenem resistance NOT DETECTED NOT DETECTED Final   Haemophilus influenzae NOT DETECTED NOT DETECTED Final   Neisseria meningitidis NOT DETECTED NOT DETECTED Final   Pseudomonas aeruginosa NOT DETECTED NOT DETECTED Final   Candida albicans NOT DETECTED NOT DETECTED Final   Candida glabrata NOT DETECTED NOT DETECTED Final   Candida krusei NOT DETECTED NOT DETECTED Final   Candida parapsilosis NOT DETECTED NOT DETECTED Final   Candida tropicalis NOT DETECTED NOT DETECTED Final  Urine culture     Status: Abnormal   Collection Time: 12/18/15 11:18 AM  Result Value Ref Range Status   Specimen Description URINE, CATHETERIZED  Final   Special Requests Normal  Final   Culture >=100,000 COLONIES/mL PROVIDENCIA STUARTII (A)  Final   Report Status 12/20/2015 FINAL  Final   Organism ID, Bacteria PROVIDENCIA STUARTII (A)  Final      Susceptibility   Providencia stuartii - MIC*    AMPICILLIN 16 RESISTANT Resistant     CEFAZOLIN >=64 RESISTANT Resistant     CEFTRIAXONE <=1 SENSITIVE Sensitive     CIPROFLOXACIN >=4 RESISTANT Resistant     GENTAMICIN >=16 RESISTANT Resistant     IMIPENEM 2 SENSITIVE Sensitive     NITROFURANTOIN 128 RESISTANT Resistant     TRIMETH/SULFA 40 SENSITIVE Sensitive     AMPICILLIN/SULBACTAM 8 SENSITIVE Sensitive     PIP/TAZO <=4 SENSITIVE Sensitive     * >=100,000 COLONIES/mL PROVIDENCIA STUARTII  Blood culture (routine x 2)     Status: None (Preliminary result)   Collection Time: 12/18/15 12:00 PM  Result Value Ref Range Status   Specimen Description BLOOD RIGHT HAND  Final   Special Requests IN PEDIATRIC BOTTLE  3CC  Final   Culture  Setup Time   Final    GRAM POSITIVE COCCI IN CLUSTERS AEROBIC BOTTLE ONLY CRITICAL RESULT CALLED TO, READ BACK BY AND VERIFIED WITH: J MARKEL,PHARMD AT 1417 12/19/15 BY L BENFIELD    Culture CULTURE REINCUBATED FOR BETTER GROWTH  Final   Report Status PENDING  Incomplete   MRSA  PCR Screening     Status: None   Collection Time: 12/18/15  3:00 PM  Result Value Ref Range Status   MRSA by PCR NEGATIVE NEGATIVE Final    Comment:        The GeneXpert MRSA Assay (FDA approved for NASAL specimens only), is one component of a comprehensive MRSA colonization surveillance program. It is not intended to diagnose MRSA infection nor to guide or monitor treatment for MRSA infections.   Culture, blood (routine x 2)     Status: Abnormal   Collection Time: 12/18/15  7:57 PM  Result Value Ref Range Status   Specimen Description BLOOD LEFT ANTECUBITAL  Final   Special Requests IN PEDIATRIC BOTTLE  3CC  Final   Culture  Setup Time   Final    GRAM NEGATIVE RODS AEROBIC BOTTLE ONLY CRITICAL RESULT CALLED TO, READ BACK BY AND VERIFIED WITH: J MARKEL 12/19/15 @ 66 M VESTAL    Culture PROVIDENCIA STUARTII (A)  Final   Report Status 12/21/2015 FINAL  Final   Organism ID, Bacteria PROVIDENCIA STUARTII  Final      Susceptibility   Providencia stuartii - MIC*    AMPICILLIN 16 RESISTANT Resistant     CEFAZOLIN >=64 RESISTANT Resistant     CEFEPIME <=1 SENSITIVE Sensitive     CEFTAZIDIME <=1 SENSITIVE Sensitive     CEFTRIAXONE <=1 SENSITIVE Sensitive     CIPROFLOXACIN >=4 RESISTANT Resistant     GENTAMICIN 8 RESISTANT Resistant     IMIPENEM 1 SENSITIVE Sensitive     TRIMETH/SULFA <=20 SENSITIVE Sensitive     AMPICILLIN/SULBACTAM 4 SENSITIVE Sensitive     PIP/TAZO <=4 SENSITIVE Sensitive     * PROVIDENCIA STUARTII  Blood Culture ID Panel (Reflexed)     Status: None   Collection Time: 12/18/15  7:57 PM  Result Value Ref Range Status   Enterococcus species NOT DETECTED NOT DETECTED Final   Vancomycin resistance NOT DETECTED NOT DETECTED Final   Listeria monocytogenes NOT DETECTED NOT DETECTED Final   Staphylococcus species NOT DETECTED NOT DETECTED Final   Staphylococcus aureus NOT DETECTED NOT DETECTED Final   Methicillin resistance NOT DETECTED NOT DETECTED Final    Streptococcus species NOT DETECTED NOT DETECTED Final   Streptococcus agalactiae NOT DETECTED NOT DETECTED Final   Streptococcus pneumoniae NOT DETECTED NOT DETECTED Final   Streptococcus pyogenes NOT DETECTED NOT DETECTED Final   Acinetobacter baumannii NOT DETECTED NOT DETECTED Final   Enterobacteriaceae species NOT DETECTED NOT DETECTED Final   Enterobacter cloacae complex NOT DETECTED NOT DETECTED Final   Escherichia coli NOT DETECTED NOT DETECTED Final   Klebsiella oxytoca NOT DETECTED NOT DETECTED Final   Klebsiella pneumoniae NOT DETECTED NOT DETECTED Final   Proteus species NOT DETECTED NOT DETECTED Final   Serratia marcescens NOT DETECTED NOT DETECTED Final   Carbapenem resistance NOT DETECTED NOT DETECTED Final   Haemophilus influenzae NOT DETECTED NOT DETECTED Final   Neisseria meningitidis NOT DETECTED NOT DETECTED Final   Pseudomonas aeruginosa NOT DETECTED NOT DETECTED Final   Candida albicans NOT DETECTED NOT DETECTED Final   Candida glabrata NOT DETECTED NOT DETECTED Final   Candida krusei NOT DETECTED NOT DETECTED Final   Candida parapsilosis NOT DETECTED NOT DETECTED Final   Candida tropicalis NOT DETECTED NOT DETECTED Final  Culture, blood (routine x 2)     Status: None (Preliminary result)   Collection Time: 12/18/15  8:21 PM  Result Value Ref Range Status   Specimen Description BLOOD RIGHT ANTECUBITAL  Final  Special Requests IN PEDIATRIC BOTTLE  1CC  Final   Culture  Setup Time   Final    GRAM POSITIVE COCCI IN CLUSTERS AEROBIC BOTTLE ONLY CRITICAL RESULT CALLED TO, READ BACK BY AND VERIFIED WITH: J MARKEL 12/19/15 @ 60 M VESTAL    Culture CULTURE REINCUBATED FOR BETTER GROWTH  Final   Report Status PENDING  Incomplete      Radiology Studies: Dg Abd 2 Views  12/20/2015  CLINICAL DATA:  Abdominal distention EXAM: ABDOMEN - 2 VIEW COMPARISON:  11/11/2015 abdominal radiograph FINDINGS: Diffuse gaseous distention of the small and large bowel. No evidence of  pneumatosis or pneumoperitoneum. Clustered 28 x 11 mm and 22 x 11 mm left renal stones appear stable. Clustered 19 x 6 mm and 4 mm left deep pelvic calcifications, cannot exclude distal left ureteral stones. IMPRESSION: 1. Diffuse gaseous distention of the small and large bowel, most suggestive of diffuse adynamic ileus. Distal small bowel obstruction cannot be excluded. If there is clinical concern for small bowel obstruction, consider correlation with CT abdomen/pelvis with oral and IV contrast. 2. Left renal stones.  Possible distal left ureteral stones. Electronically Signed   By: Ilona Sorrel M.D.   On: 12/20/2015 11:48    Scheduled Meds: . baclofen  10 mg Oral TID  . darifenacin  7.5 mg Oral Daily  . famotidine  20 mg Oral Daily  . feeding supplement (PRO-STAT SUGAR FREE 64)  30 mL Oral BID  . gabapentin  100 mg Oral QHS  . insulin aspart  0-5 Units Subcutaneous QHS  . insulin aspart  0-9 Units Subcutaneous TID WC  . multivitamin with minerals  1 tablet Oral Daily  . piperacillin-tazobactam (ZOSYN)  IV  3.375 g Intravenous Q8H  . polyethylene glycol  17 g Oral Daily  . pravastatin  20 mg Oral q1800  . rivaroxaban  20 mg Oral Q supper  . senna-docusate  1 tablet Oral BID  . vancomycin  750 mg Intravenous Q12H  . vitamin C  500 mg Oral Daily   Continuous Infusions: . dextrose 5 % with kcl 75 mL/hr at 12/21/15 1145     LOS: 3 days   Time spent: 20 minutes   Faye Ramsay, MD [facility] Pager [phone]  If 7PM-7AM, please contact night-coverage [email or web address] Password [email or web address] 12/21/2015, 2:14 PM

## 2015-12-22 ENCOUNTER — Encounter (HOSPITAL_COMMUNITY): Payer: Self-pay | Admitting: Radiology

## 2015-12-22 ENCOUNTER — Inpatient Hospital Stay (HOSPITAL_COMMUNITY): Payer: Medicare Other

## 2015-12-22 DIAGNOSIS — Y738 Miscellaneous gastroenterology and urology devices associated with adverse incidents, not elsewhere classified: Secondary | ICD-10-CM

## 2015-12-22 DIAGNOSIS — B9689 Other specified bacterial agents as the cause of diseases classified elsewhere: Secondary | ICD-10-CM

## 2015-12-22 DIAGNOSIS — T83511A Infection and inflammatory reaction due to indwelling urethral catheter, initial encounter: Secondary | ICD-10-CM

## 2015-12-22 DIAGNOSIS — N200 Calculus of kidney: Secondary | ICD-10-CM

## 2015-12-22 DIAGNOSIS — R7881 Bacteremia: Secondary | ICD-10-CM | POA: Diagnosis present

## 2015-12-22 DIAGNOSIS — B957 Other staphylococcus as the cause of diseases classified elsewhere: Secondary | ICD-10-CM

## 2015-12-22 LAB — BASIC METABOLIC PANEL
Anion gap: 5 (ref 5–15)
BUN: 10 mg/dL (ref 6–20)
CALCIUM: 9 mg/dL (ref 8.9–10.3)
CHLORIDE: 108 mmol/L (ref 101–111)
CO2: 26 mmol/L (ref 22–32)
Creatinine, Ser: 0.59 mg/dL (ref 0.44–1.00)
GFR calc non Af Amer: >60 mL/min (ref >60)
GLUCOSE: 152 mg/dL — AB (ref 65–99)
Potassium: 4.3 mmol/L (ref 3.5–5.1)
SODIUM: 139 mmol/L (ref 135–145)

## 2015-12-22 LAB — CULTURE, BLOOD (ROUTINE X 2)

## 2015-12-22 LAB — GLUCOSE, CAPILLARY
Glucose-Capillary: 158 mg/dL — ABNORMAL HIGH (ref 65–99)
Glucose-Capillary: 163 mg/dL — ABNORMAL HIGH (ref 65–99)
Glucose-Capillary: 174 mg/dL — ABNORMAL HIGH (ref 65–99)
Glucose-Capillary: 177 mg/dL — ABNORMAL HIGH (ref 65–99)

## 2015-12-22 LAB — CBC
HCT: 24.6 % — ABNORMAL LOW (ref 36.0–46.0)
Hemoglobin: 8.1 g/dL — ABNORMAL LOW (ref 12.0–15.0)
MCH: 27.6 pg (ref 26.0–34.0)
MCHC: 32.9 g/dL (ref 30.0–36.0)
MCV: 83.7 fL (ref 78.0–100.0)
PLATELETS: 353 10*3/uL (ref 150–400)
RBC: 2.94 MIL/uL — AB (ref 3.87–5.11)
RDW: 19.3 % — ABNORMAL HIGH (ref 11.5–15.5)
WBC: 14.8 10*3/uL — ABNORMAL HIGH (ref 4.0–10.5)

## 2015-12-22 MED ORDER — INSULIN ASPART 100 UNIT/ML ~~LOC~~ SOLN
0.0000 [IU] | SUBCUTANEOUS | Status: DC
  Administered 2015-12-22 – 2015-12-23 (×6): 2 [IU] via SUBCUTANEOUS
  Administered 2015-12-23: 5 [IU] via SUBCUTANEOUS
  Administered 2015-12-24: 2 [IU] via SUBCUTANEOUS
  Administered 2015-12-24: 3 [IU] via SUBCUTANEOUS
  Administered 2015-12-24 (×2): 2 [IU] via SUBCUTANEOUS
  Administered 2015-12-24: 1 [IU] via SUBCUTANEOUS
  Administered 2015-12-24: 3 [IU] via SUBCUTANEOUS
  Administered 2015-12-25: 2 [IU] via SUBCUTANEOUS
  Administered 2015-12-25 (×3): 1 [IU] via SUBCUTANEOUS
  Administered 2015-12-26: 2 [IU] via SUBCUTANEOUS
  Administered 2015-12-26 (×2): 1 [IU] via SUBCUTANEOUS
  Administered 2015-12-26 – 2015-12-27 (×4): 2 [IU] via SUBCUTANEOUS
  Administered 2015-12-27: 1 [IU] via SUBCUTANEOUS
  Administered 2015-12-27 – 2015-12-28 (×3): 2 [IU] via SUBCUTANEOUS
  Administered 2015-12-28: 3 [IU] via SUBCUTANEOUS
  Administered 2015-12-28 (×2): 1 [IU] via SUBCUTANEOUS
  Administered 2015-12-28: 2 [IU] via SUBCUTANEOUS
  Administered 2015-12-29: 1 [IU] via SUBCUTANEOUS
  Administered 2015-12-29 (×3): 2 [IU] via SUBCUTANEOUS

## 2015-12-22 MED ORDER — MORPHINE SULFATE (PF) 2 MG/ML IV SOLN
1.0000 mg | INTRAVENOUS | Status: DC | PRN
  Administered 2015-12-25 (×2): 1 mg via INTRAVENOUS
  Filled 2015-12-22 (×2): qty 1

## 2015-12-22 MED ORDER — IOPAMIDOL (ISOVUE-300) INJECTION 61%
INTRAVENOUS | Status: AC
  Administered 2015-12-22: 100 mL
  Filled 2015-12-22: qty 100

## 2015-12-22 MED ORDER — DEXTROSE 5 % IV SOLN
2.0000 g | INTRAVENOUS | Status: DC
  Administered 2015-12-22 – 2015-12-29 (×8): 2 g via INTRAVENOUS
  Filled 2015-12-22 (×10): qty 2

## 2015-12-22 NOTE — Consult Note (Signed)
[facility]ious [facility]    Date of Admission:  12/18/2015    Antibiotics:        Day 4 vancomycin and zosyn       Reason for Consult: Bacteremia and recurrent catheter associated UTI    Referring Physician: Dr. Mart Piggs Primary Care Physician: None recorded  Active Problems:   UTI (urinary tract infection)   SIRS (systemic inflammatory response syndrome) ([place])   Palliative care encounter   Urinary tract infectious disease   . baclofen  10 mg Oral TID  . darifenacin  7.5 mg Oral Daily  . famotidine  20 mg Oral Daily  . feeding supplement (ENSURE ENLIVE)  237 mL Oral BID BM  . gabapentin  100 mg Oral QHS  . insulin aspart  0-5 Units Subcutaneous QHS  . insulin aspart  0-9 Units Subcutaneous TID WC  . multivitamin with minerals  1 tablet Oral Daily  . piperacillin-tazobactam (ZOSYN)  IV  3.375 g Intravenous Q8H  . polyethylene glycol  17 g Oral Daily  . pravastatin  20 mg Oral q1800  . rivaroxaban  20 mg Oral Q supper  . senna-docusate  1 tablet Oral BID  . vancomycin  750 mg Intravenous Q12H  . vitamin C  500 mg Oral Daily    Recommendations: 1. Continue antibiotics on vancomycin and ceftriaxone 1g daily 2. Repeat blood cultures today 3. Agree with urology consultation in setting of ureteropelvic stone, inflammatory changes of bladder, and possible pyelonephritis  Assessment: Bacteremia with mixed flora: Ms. Organ was admitted with primary symptoms of somnolence and confusion plus abdominal pain and found to have bacteremia from both a coagulase negative staphylococcus and providencia stuartii. This staph has a similar susceptibility profile as as the organism identified on her recent admission one month ago, except resistant to the Bactrim she was treated with at discharge. It is positive in 3/4 blood cultures so very unlikely to be a contaminant. She has no significant skin breakdown as a source of new infection. She also had a negative  echocardiogram at that visit so this is unlikely to be recurrence due to incompletely treated endocarditis. This organism was not isolated from her urine culture as a source for persistent infection either. Overall it is not entirely clear if this infection was inadequately treated or has recurred.  Her bacteremia from providencia is secondary to her current urinary tract infection. This organism is susceptible to Bactrim so should have been adequately treated. She is at high risk for recurrent infection due to her chronic indwelling foley. However she now has left sided nephrolithiasis identified on CT. Her extent of inflammatory changes on CT is concerning for pyelonephritis as the cause of bacteremia and urological evaluation would be appropriate. She has also returned after a much shorter interval at home compared to earlier in the year so this may be an additional acute process leading to a new complicated UTI.  Fortunately she appears to be clinically improving although her abdominal distention is still significant. Based on susceptibility testing it would be most appropriate to continue treating her staph infection with vancomycin and narrowing pip/tazo to ceftriaxone for treatment of providencia.   HPI: MIKAEL DRYER is a 55 y.o. female with a history significant for type 2 diabetes, chronic kidney disease stage 3, and multiple sclerosis complicated by a neurogenic bladder dependent on chronic indwelling foley catheter who presented to the ED from home after becoming more somnolent and confused  with some poorly localized abdominal pain. She was recently seen at this hospital on 5/31 for treatment of bacteremia from a urinary tract infection and discharged on 2 weeks of bactrim. Earlier this year she has been hospitalized for recurrent UTI and for infection of sacral decubitus ulcers. She presented with leukocytosis, lactic acidosis, and tachycardia. Her only focal symptom is diffuse abdominal pain  and distention. She has not had high fever or felt particularly feverish. The Infectious Disease service was consulted for blood cultures positive for coagulase negative staphylococcus and providencia.  She has been followed outpatient by Dr. Matilde Sprang with [facility] for her neurogenic bladder and chronic foley catheter.   Review of Systems: Review of Systems  Constitutional: Positive for malaise/fatigue. Negative for fever and chills.  HENT: Negative for congestion.   Eyes: Negative for blurred vision.  Respiratory: Negative for shortness of breath.   Cardiovascular: Negative for leg swelling.  Gastrointestinal: Positive for abdominal pain and constipation. Negative for nausea and blood in stool.  Genitourinary: Negative for hematuria.  Musculoskeletal: Negative for myalgias.  Skin: Negative for rash.  Neurological: Negative for dizziness and focal weakness.  Psychiatric/Behavioral: The patient is not nervous/anxious.     Past Medical History  Diagnosis Date  . Diabetes mellitus without complication ([place])   . Multiple sclerosis diagnosed 2002-not on Therapy any longer 06/26/2012  . Stage 4 decubitus ulcer ([place]) 07/05/2015  . Acute renal failure superimposed on stage 3 chronic kidney disease ([place]) 07/21/2015  . Malnutrition of moderate degree 07/21/2015  . UTI (lower urinary tract infection)   . DVT (deep venous thrombosis) ([place])   . Stroke [facility])     Social History  Substance Use Topics  . Smoking status: Former Smoker -- 1.00 packs/day for 20 years    Types: Cigarettes    Quit date: 11/19/2014  . Smokeless tobacco: Never Used  . Alcohol Use: 3.0 oz/week    5 Glasses of wine per week    Family History  Problem Relation Age of Onset  . Diabetes Mother   . Diabetes Father   . Diabetes Sister   . Scoliosis Sister   . Alzheimer's disease Mother    No Known Allergies  OBJECTIVE: Blood pressure 152/72, pulse 81, temperature 99.7 F (37.6 C), temperature source Oral,  resp. rate 16, height 5\' 3"  (1.6 m), weight 63.6 kg (140 lb 3.4 oz), SpO2 94 %.  Physical Exam GENERAL- chronically ill appearing, no acute distress HEENT- Atraumatic, oral mucosa appears moist CARDIAC- Systolic murmur over left upper sternal border RESP- CTAB, no wheezes or crackles. ABDOMEN- Distended, diffusely tender to palpation, no guarding, no dullness to percussion BACK- Scarring/healed areas over sacrum, skin intact EXTREMITIES- Thick keratotic skin changes over dorsum of feet bilaterally, no heel or foot ulcers noted SKIN- Warm, dry, No rash or lesion. PSYCH- Flat affect    Lab Results Lab Results  Component Value Date   WBC 14.8* 12/22/2015   HGB 8.1* 12/22/2015   HCT 24.6* 12/22/2015   MCV 83.7 12/22/2015   PLT 353 12/22/2015    Lab Results  Component Value Date   CREATININE 0.59 12/22/2015   BUN 10 12/22/2015   NA 139 12/22/2015   K 4.3 12/22/2015   CL 108 12/22/2015   CO2 26 12/22/2015    Lab Results  Component Value Date   ALT 7* 12/19/2015   AST 12* 12/19/2015   ALKPHOS 50 12/19/2015   BILITOT 0.5 12/19/2015     Microbiology: Recent Results (from the past 240  hour(s))  Blood culture (routine x 2)     Status: Abnormal   Collection Time: 12/18/15 10:35 AM  Result Value Ref Range Status   Specimen Description BLOOD LEFT HAND  Final   Special Requests BOTTLES DRAWN AEROBIC AND ANAEROBIC  5CC  Final   Culture  Setup Time   Final    GRAM POSITIVE COCCI IN CLUSTERS IN BOTH AEROBIC AND ANAEROBIC BOTTLES CRITICAL RESULT CALLED TO, READ BACK BY AND VERIFIED WITH: J MARKEL,PHARMD AT [id number] 12/19/15 BY L BENFIELD    Culture STAPHYLOCOCCUS SPECIES (COAGULASE NEGATIVE) (A)  Final   Report Status 12/21/2015 FINAL  Final   Organism ID, Bacteria STAPHYLOCOCCUS SPECIES (COAGULASE NEGATIVE)  Final      Susceptibility   Staphylococcus species (coagulase negative) - MIC*    CIPROFLOXACIN 1 SENSITIVE Sensitive     ERYTHROMYCIN <=0.25 SENSITIVE Sensitive      GENTAMICIN <=0.5 SENSITIVE Sensitive     OXACILLIN 0.5 RESISTANT Resistant     TETRACYCLINE <=1 SENSITIVE Sensitive     VANCOMYCIN 1 SENSITIVE Sensitive     TRIMETH/SULFA 20 SENSITIVE Sensitive     CLINDAMYCIN <=0.25 SENSITIVE Sensitive     RIFAMPIN <=0.5 SENSITIVE Sensitive     Inducible Clindamycin NEGATIVE Sensitive     * STAPHYLOCOCCUS SPECIES (COAGULASE NEGATIVE)  Blood Culture ID Panel (Reflexed)     Status: Abnormal   Collection Time: 12/18/15 10:35 AM  Result Value Ref Range Status   Enterococcus species NOT DETECTED NOT DETECTED Final   Vancomycin resistance NOT DETECTED NOT DETECTED Final   Listeria monocytogenes NOT DETECTED NOT DETECTED Final   Staphylococcus species DETECTED (A) NOT DETECTED Final    Comment: CRITICAL RESULT CALLED TO, READ BACK BY AND VERIFIED WITH: J MARKEL,PHARMD AT 1417 12/19/15 BY L BENFIELD    Staphylococcus aureus NOT DETECTED NOT DETECTED Final   Methicillin resistance DETECTED (A) NOT DETECTED Final    Comment: CRITICAL RESULT CALLED TO, READ BACK BY AND VERIFIED WITH: J MARKEL,PHARMD AT 1417 12/19/15 BY L BENFIELD    Streptococcus species NOT DETECTED NOT DETECTED Final   Streptococcus agalactiae NOT DETECTED NOT DETECTED Final   Streptococcus pneumoniae NOT DETECTED NOT DETECTED Final   Streptococcus pyogenes NOT DETECTED NOT DETECTED Final   Acinetobacter baumannii NOT DETECTED NOT DETECTED Final   Enterobacteriaceae species NOT DETECTED NOT DETECTED Final   Enterobacter cloacae complex NOT DETECTED NOT DETECTED Final   Escherichia coli NOT DETECTED NOT DETECTED Final   Klebsiella oxytoca NOT DETECTED NOT DETECTED Final   Klebsiella pneumoniae NOT DETECTED NOT DETECTED Final   Proteus species NOT DETECTED NOT DETECTED Final   Serratia marcescens NOT DETECTED NOT DETECTED Final   Carbapenem resistance NOT DETECTED NOT DETECTED Final   Haemophilus influenzae NOT DETECTED NOT DETECTED Final   Neisseria meningitidis NOT DETECTED NOT DETECTED  Final   Pseudomonas aeruginosa NOT DETECTED NOT DETECTED Final   Candida albicans NOT DETECTED NOT DETECTED Final   Candida glabrata NOT DETECTED NOT DETECTED Final   Candida krusei NOT DETECTED NOT DETECTED Final   Candida parapsilosis NOT DETECTED NOT DETECTED Final   Candida tropicalis NOT DETECTED NOT DETECTED Final  Urine culture     Status: Abnormal   Collection Time: 12/18/15 11:18 AM  Result Value Ref Range Status   Specimen Description URINE, CATHETERIZED  Final   Special Requests Normal  Final   Culture >=100,000 COLONIES/mL PROVIDENCIA STUARTII (A)  Final   Report Status 12/20/2015 FINAL  Final   Organism ID, Bacteria PROVIDENCIA STUARTII (  A)  Final      Susceptibility   Providencia stuartii - MIC*    AMPICILLIN 16 RESISTANT Resistant     CEFAZOLIN >=64 RESISTANT Resistant     CEFTRIAXONE <=1 SENSITIVE Sensitive     CIPROFLOXACIN >=4 RESISTANT Resistant     GENTAMICIN >=16 RESISTANT Resistant     IMIPENEM 2 SENSITIVE Sensitive     NITROFURANTOIN 128 RESISTANT Resistant     TRIMETH/SULFA 40 SENSITIVE Sensitive     AMPICILLIN/SULBACTAM 8 SENSITIVE Sensitive     PIP/TAZO <=4 SENSITIVE Sensitive     * >=100,000 COLONIES/mL PROVIDENCIA STUARTII  Blood culture (routine x 2)     Status: None (Preliminary result)   Collection Time: 12/18/15 12:00 PM  Result Value Ref Range Status   Specimen Description BLOOD RIGHT HAND  Final   Special Requests IN PEDIATRIC BOTTLE  3CC  Final   Culture  Setup Time   Final    GRAM POSITIVE COCCI IN CLUSTERS AEROBIC BOTTLE ONLY CRITICAL RESULT CALLED TO, READ BACK BY AND VERIFIED WITH: J MARKEL,PHARMD AT 1417 12/19/15 BY L BENFIELD    Culture CULTURE REINCUBATED FOR BETTER GROWTH  Final   Report Status PENDING  Incomplete  MRSA PCR Screening     Status: None   Collection Time: 12/18/15  3:00 PM  Result Value Ref Range Status   MRSA by PCR NEGATIVE NEGATIVE Final    Comment:        The GeneXpert MRSA Assay (FDA approved for NASAL  specimens only), is one component of a comprehensive MRSA colonization surveillance program. It is not intended to diagnose MRSA infection nor to guide or monitor treatment for MRSA infections.   Culture, blood (routine x 2)     Status: Abnormal   Collection Time: 12/18/15  7:57 PM  Result Value Ref Range Status   Specimen Description BLOOD LEFT ANTECUBITAL  Final   Special Requests IN PEDIATRIC BOTTLE  3CC  Final   Culture  Setup Time   Final    GRAM NEGATIVE RODS AEROBIC BOTTLE ONLY CRITICAL RESULT CALLED TO, READ BACK BY AND VERIFIED WITH: J MARKEL 12/19/15 @ 1553 M VESTAL    Culture PROVIDENCIA STUARTII (A)  Final   Report Status 12/21/2015 FINAL  Final   Organism ID, Bacteria PROVIDENCIA STUARTII  Final      Susceptibility   Providencia stuartii - MIC*    AMPICILLIN 16 RESISTANT Resistant     CEFAZOLIN >=64 RESISTANT Resistant     CEFEPIME <=1 SENSITIVE Sensitive     CEFTAZIDIME <=1 SENSITIVE Sensitive     CEFTRIAXONE <=1 SENSITIVE Sensitive     CIPROFLOXACIN >=4 RESISTANT Resistant     GENTAMICIN 8 RESISTANT Resistant     IMIPENEM 1 SENSITIVE Sensitive     TRIMETH/SULFA <=20 SENSITIVE Sensitive     AMPICILLIN/SULBACTAM 4 SENSITIVE Sensitive     PIP/TAZO <=4 SENSITIVE Sensitive     * PROVIDENCIA STUARTII  Blood Culture ID Panel (Reflexed)     Status: None   Collection Time: 12/18/15  7:57 PM  Result Value Ref Range Status   Enterococcus species NOT DETECTED NOT DETECTED Final   Vancomycin resistance NOT DETECTED NOT DETECTED Final   Listeria monocytogenes NOT DETECTED NOT DETECTED Final   Staphylococcus species NOT DETECTED NOT DETECTED Final   Staphylococcus aureus NOT DETECTED NOT DETECTED Final   Methicillin resistance NOT DETECTED NOT DETECTED Final   Streptococcus species NOT DETECTED NOT DETECTED Final   Streptococcus agalactiae NOT DETECTED NOT DETECTED Final  Streptococcus pneumoniae NOT DETECTED NOT DETECTED Final   Streptococcus pyogenes NOT DETECTED NOT  DETECTED Final   Acinetobacter baumannii NOT DETECTED NOT DETECTED Final   Enterobacteriaceae species NOT DETECTED NOT DETECTED Final   Enterobacter cloacae complex NOT DETECTED NOT DETECTED Final   Escherichia coli NOT DETECTED NOT DETECTED Final   Klebsiella oxytoca NOT DETECTED NOT DETECTED Final   Klebsiella pneumoniae NOT DETECTED NOT DETECTED Final   Proteus species NOT DETECTED NOT DETECTED Final   Serratia marcescens NOT DETECTED NOT DETECTED Final   Carbapenem resistance NOT DETECTED NOT DETECTED Final   Haemophilus influenzae NOT DETECTED NOT DETECTED Final   Neisseria meningitidis NOT DETECTED NOT DETECTED Final   Pseudomonas aeruginosa NOT DETECTED NOT DETECTED Final   Candida albicans NOT DETECTED NOT DETECTED Final   Candida glabrata NOT DETECTED NOT DETECTED Final   Candida krusei NOT DETECTED NOT DETECTED Final   Candida parapsilosis NOT DETECTED NOT DETECTED Final   Candida tropicalis NOT DETECTED NOT DETECTED Final  Culture, blood (routine x 2)     Status: Abnormal   Collection Time: 12/18/15  8:21 PM  Result Value Ref Range Status   Specimen Description BLOOD RIGHT ANTECUBITAL  Final   Special Requests IN PEDIATRIC BOTTLE  1CC  Final   Culture  Setup Time   Final    GRAM POSITIVE COCCI IN CLUSTERS AEROBIC BOTTLE ONLY CRITICAL RESULT CALLED TO, READ BACK BY AND VERIFIED WITH: J MARKEL 12/19/15 @ 1607 M VESTAL    Culture STAPHYLOCOCCUS SPECIES (COAGULASE NEGATIVE) (A)  Final   Report Status 12/22/2015 FINAL  Final   Organism ID, Bacteria STAPHYLOCOCCUS SPECIES (COAGULASE NEGATIVE)  Final      Susceptibility   Staphylococcus species (coagulase negative) - MIC*    CIPROFLOXACIN >=8 RESISTANT Resistant     ERYTHROMYCIN 0.5 SENSITIVE Sensitive     GENTAMICIN <=0.5 SENSITIVE Sensitive     OXACILLIN >=4 RESISTANT Resistant     TETRACYCLINE <=1 SENSITIVE Sensitive     VANCOMYCIN 1 SENSITIVE Sensitive     TRIMETH/SULFA 80 RESISTANT Resistant     CLINDAMYCIN <=0.25  SENSITIVE Sensitive     RIFAMPIN <=0.5 SENSITIVE Sensitive     Inducible Clindamycin NEGATIVE Sensitive     * STAPHYLOCOCCUS SPECIES (COAGULASE NEGATIVE)    Collier Salina, MD PGY-II Internal Medicine Resident Pager# [phone] 12/22/2015, 3:35 PM

## 2015-12-22 NOTE — Consult Note (Signed)
[facility] Surgery Consult Note  [name] 19-Apr-1961  [id number].    Requesting MD: Dr. Mart Piggs Chief Complaint/Reason for Consult: SBO vs ileus   HPI:  55 year-old female with a PMH of MS, decubitus ulcers, AKI, CKD, UTI, DVT, CVA, and malnutrition who was admitted on 12/18/15 with acute metabolic encephalopathy and sepsis, suspect of secondary to UTI and acute left pyelonephritis. Patient has a chronic indwelling urinary catheter. Of note, patient was recently discharged from hospital 12/18/15 on 14 days of bactrim after being treated inpatient for sepsis due to bacteremia and pyelonephritis,. Prior to admission the patient was taking Metformin, Bactrim, Xarelto, and Lasix PRN. Patient was found to have a distended abdomen, CT abdomen/pelvis was ordered and general surgery was asked to consult.  Today patient is laying in bed in NAD. She reports generalized, non-radiating abdominal pain that is dull. Denies nausea and vomiting. Denies diarrhea or constipation. Her last BM was today at 11 am and it was soft and brown. Does report abdominal distention. She denies fever, chills, HA, CP, and SOB. Pertinent past surgical history includes ventral hernia repair with mesh, mesh removal, and a tubal ligation. denies PMH cholecystectomy, appendectomy. Denies previous hospitalizations for bowel obstruction. At baseline she mobilizes using a wheelchair. She lives at home by herself currently.   ROS: All systems reviewed and otherwise negative except for as above  Family History  Problem Relation Age of Onset  . Diabetes Mother   . Diabetes Father   . Diabetes Sister   . Scoliosis Sister   . Alzheimer's disease Mother     Past Medical History  Diagnosis Date  . Diabetes mellitus without complication ([place])   . Multiple sclerosis diagnosed 2002-not on Therapy any longer 06/26/2012  . Stage 4 decubitus ulcer ([place]) 07/05/2015  . Acute renal failure superimposed on stage 3 chronic kidney  disease ([place]) 07/21/2015  . Malnutrition of moderate degree 07/21/2015  . UTI (lower urinary tract infection)   . DVT (deep venous thrombosis) ([place])   . Stroke [facility])     Past Surgical History  Procedure Laterality Date  . Hernia mesh removal    . Tubes tided    . Tubal ligation    . Esophagogastroduodenoscopy Left 01/02/2015    Procedure: ESOPHAGOGASTRODUODENOSCOPY (EGD);  Surgeon: Arta Silence, MD;  Location: [facility];  Service: Endoscopy;  Laterality: Left;    Social History:  reports that she quit smoking about 13 months ago. Her smoking use included Cigarettes. She has a 20 pack-year smoking history. She has never used smokeless tobacco. She reports that she drinks about 3.0 oz of alcohol per week. She reports that she does not use illicit drugs.  Allergies: No Known Allergies  Medications Prior to Admission  Medication Sig Dispense Refill  . Acetaminophen (APAP) 325 MG tablet Take 650 mg by mouth every 6 (six) hours as needed for pain.    Marland Kitchen albuterol (PROVENTIL HFA;VENTOLIN HFA) 108 (90 BASE) MCG/ACT inhaler Inhale 2 puffs into the lungs every 4 (four) hours as needed for wheezing or shortness of breath. 1 Inhaler 4  . Amino Acids-Protein Hydrolys (FEEDING SUPPLEMENT, PRO-STAT SUGAR FREE 64,) LIQD Take 30 mLs by mouth 2 (two) times daily. 900 mL 0  . baclofen (LIORESAL) 10 MG tablet Take 1 tablet (10 mg total) by mouth 3 (three) times daily. (Patient taking differently: Take 10 mg by mouth 2 (two) times daily. ) 90 each 0  . famotidine (PEPCID) 20 MG tablet Take 1 tablet (20 mg  total) by mouth 2 (two) times daily. 60 tablet 0  . furosemide (LASIX) 40 MG tablet Take 1 tablet (40 mg total) by mouth daily as needed for fluid or edema. Reported on 07/05/2015 (Patient taking differently: Take 40 mg by mouth 2 (two) times daily. Reported on 07/05/2015) 30 tablet 0  . gabapentin (NEURONTIN) 100 MG capsule Take 1 capsule (100 mg total) by mouth at bedtime. 30 capsule 0  . glipiZIDE  (GLUCOTROL) 5 MG tablet Take 5 mg by mouth 2 (two) times daily.  0  . HYDROcodone-acetaminophen (NORCO) 5-325 MG tablet Take 1 tablet by mouth every 6 (six) hours as needed for moderate pain. 15 tablet 0  . lisinopril (PRINIVIL,ZESTRIL) 2.5 MG tablet Take 2.5 mg by mouth daily.  0  . lovastatin (MEVACOR) 20 MG tablet Take 20 mg by mouth daily.  0  . metFORMIN (GLUCOPHAGE) 500 MG tablet Take 1 tablet (500 mg total) by mouth 2 (two) times daily. 60 tablet 0  . Multiple Vitamin (MULTIVITAMIN WITH MINERALS) TABS tablet Take 1 tablet by mouth daily. 30 tablet 0  . Omega 3 1000 MG CAPS Take 1,000 mg by mouth daily.    . polyethylene glycol (MIRALAX / GLYCOLAX) packet Take 17 g by mouth 2 (two) times daily. 14 each 0  . pravastatin (PRAVACHOL) 20 MG tablet Take 1 tablet (20 mg total) by mouth daily at 6 PM. 30 tablet 0  . rivaroxaban (XARELTO) 20 MG TABS tablet Take 1 tablet (20 mg total) by mouth daily with supper. Reported on 08/12/2015 30 tablet 0  . solifenacin (VESICARE) 5 MG tablet Take 1 tablet (5 mg total) by mouth daily. 30 tablet 0  . sulfamethoxazole-trimethoprim (BACTRIM DS,SEPTRA DS) 800-160 MG tablet Take 1 tablet by mouth every 12 (twelve) hours. 16 tablet 0  . vitamin C (ASCORBIC ACID) 500 MG tablet Take 500 mg by mouth daily.      Blood pressure 152/72, pulse 81, temperature 99.7 F (37.6 C), temperature source Oral, resp. rate 16, height '5\' 3"'  (1.6 m), weight 63.6 kg (140 lb 3.4 oz), SpO2 94 %. Physical Exam: General: pleasant, obese AA femalewho is laying in bed in NAD HEENT: head is normocephalic, atraumatic.  Sclera are noninjected. Mouth is pink and moist Heart: regular, rate, and rhythm.  No obvious murmurs, gallops, or rubs noted.  Palpable pedal pulses bilaterally Lungs: CTAB, no wheezes, rhonchi, or rales noted.  Respiratory effort nonlabored Abd: soft, mildy TTP in all 4 quadrants, very dsitended, +BS, no masses, hernias, or organomegaly; no peritonitis or guarding. MS: all 4  extremities are symmetrical with no cyanosis, clubbing, or edema; diminished strength in upper and lower extremities. Skin: warm and dry with no masses, lesions, or rashes Psych: A&Ox3 with an appropriate affect.   Results for orders placed or performed during the hospital encounter of 12/18/15 (from the past 48 hour(s))  Glucose, capillary     Status: Abnormal   Collection Time: 12/20/15  4:33 PM  Result Value Ref Range   Glucose-Capillary 164 (H) 65 - 99 mg/dL  Glucose, capillary     Status: Abnormal   Collection Time: 12/20/15  9:18 PM  Result Value Ref Range   Glucose-Capillary 187 (H) 65 - 99 mg/dL  CBC     Status: Abnormal   Collection Time: 12/21/15  5:59 AM  Result Value Ref Range   WBC 15.9 (H) 4.0 - 10.5 K/uL   RBC 3.08 (L) 3.87 - 5.11 MIL/uL   Hemoglobin 8.2 (L) 12.0 - 15.0  g/dL    Comment: REPEATED TO VERIFY SPECIMEN CHECKED FOR CLOTS    HCT 25.8 (L) 36.0 - 46.0 %   MCV 83.8 78.0 - 100.0 fL   MCH 26.6 26.0 - 34.0 pg   MCHC 31.8 30.0 - 36.0 g/dL   RDW 19.2 (H) 11.5 - 15.5 %   Platelets 359 150 - 400 K/uL  Basic metabolic panel     Status: Abnormal   Collection Time: 12/21/15  5:59 AM  Result Value Ref Range   Sodium 143 135 - 145 mmol/L   Potassium 2.9 (L) 3.5 - 5.1 mmol/L   Chloride 115 (H) 101 - 111 mmol/L   CO2 21 (L) 22 - 32 mmol/L   Glucose, Bld 184 (H) 65 - 99 mg/dL   BUN 19 6 - 20 mg/dL   Creatinine, Ser 0.67 0.44 - 1.00 mg/dL   Calcium 9.1 8.9 - 10.3 mg/dL   GFR calc non Af Amer >60 >60 mL/min   GFR calc Af Amer >60 >60 mL/min    Comment: (NOTE) The eGFR has been calculated using the CKD EPI equation. This calculation has not been validated in all clinical situations. eGFR's persistently <60 mL/min signify possible Chronic Kidney Disease.    Anion gap 7 5 - 15  Glucose, capillary     Status: Abnormal   Collection Time: 12/21/15  8:19 AM  Result Value Ref Range   Glucose-Capillary 170 (H) 65 - 99 mg/dL  Glucose, capillary     Status: Abnormal    Collection Time: 12/21/15 12:09 PM  Result Value Ref Range   Glucose-Capillary 155 (H) 65 - 99 mg/dL  Glucose, capillary     Status: Abnormal   Collection Time: 12/21/15  4:11 PM  Result Value Ref Range   Glucose-Capillary 176 (H) 65 - 99 mg/dL  Glucose, capillary     Status: Abnormal   Collection Time: 12/21/15 10:54 PM  Result Value Ref Range   Glucose-Capillary 168 (H) 65 - 99 mg/dL   Comment 1 Notify RN    Comment 2 Document in Chart   Glucose, capillary     Status: Abnormal   Collection Time: 12/22/15  7:30 AM  Result Value Ref Range   Glucose-Capillary 158 (H) 65 - 99 mg/dL  CBC     Status: Abnormal   Collection Time: 12/22/15  7:35 AM  Result Value Ref Range   WBC 14.8 (H) 4.0 - 10.5 K/uL   RBC 2.94 (L) 3.87 - 5.11 MIL/uL   Hemoglobin 8.1 (L) 12.0 - 15.0 g/dL   HCT 24.6 (L) 36.0 - 46.0 %   MCV 83.7 78.0 - 100.0 fL   MCH 27.6 26.0 - 34.0 pg   MCHC 32.9 30.0 - 36.0 g/dL   RDW 19.3 (H) 11.5 - 15.5 %   Platelets 353 150 - 400 K/uL  Basic metabolic panel     Status: Abnormal   Collection Time: 12/22/15  7:35 AM  Result Value Ref Range   Sodium 139 135 - 145 mmol/L   Potassium 4.3 3.5 - 5.1 mmol/L    Comment: HEMOLYSIS AT THIS LEVEL MAY AFFECT RESULT   Chloride 108 101 - 111 mmol/L   CO2 26 22 - 32 mmol/L   Glucose, Bld 152 (H) 65 - 99 mg/dL   BUN 10 6 - 20 mg/dL   Creatinine, Ser 0.59 0.44 - 1.00 mg/dL   Calcium 9.0 8.9 - 10.3 mg/dL   GFR calc non Af Amer >60 >60 mL/min   GFR calc  Af Amer >60 >60 mL/min    Comment: (NOTE) The eGFR has been calculated using the CKD EPI equation. This calculation has not been validated in all clinical situations. eGFR's persistently <60 mL/min signify possible Chronic Kidney Disease.    Anion gap 5 5 - 15  Glucose, capillary     Status: Abnormal   Collection Time: 12/22/15 12:14 PM  Result Value Ref Range   Glucose-Capillary 163 (H) 65 - 99 mg/dL   Ct Abdomen Pelvis W Contrast  12/22/2015  CLINICAL DATA:  55 year old female  admitted 12/18/2015 with urinary tract infection and altered mental status. Acute renal failure. Prior ultrasound exam 12/18/2015 demonstrates evidence of pyelonephritis, left nephrolithiasis, and hydronephrosis. EXAM: CT ABDOMEN AND PELVIS WITH CONTRAST TECHNIQUE: Multidetector CT imaging of the abdomen and pelvis was performed using the standard protocol following bolus administration of intravenous contrast. CONTRAST:  158m ISOVUE-300 IOPAMIDOL (ISOVUE-300) INJECTION 61% COMPARISON:  Ultrasound 12/18/2015, CT 12/15/2014 FINDINGS: Lower chest: Unremarkable appearance of the soft tissues of the chest wall. Heart size within normal limits.  No pericardial fluid/thickening. No lower mediastinal adenopathy. Unremarkable appearance of the distal esophagus. Small hiatal hernia Dependent atelectasis with trace bilateral pleural effusions. Abdomen/pelvis: Unremarkable appearance of liver and spleen. Unremarkable bilateral adrenal glands. Unremarkable pancreas. Unremarkable gallbladder. Distended stomach, duodenum, and jejunum, with multiple air-fluid levels and dilated bowel loops. There is decompression of distal ileum in the right lower quadrant, with a transition on image 53. Mild to moderate stool burden.  Colonic diverticula. Within the low abdomen/pelvis there are ill-defined inflammatory changes with contracted appearance of the local anatomy including sigmoid colon, dome of the urinary bladder, adjacent small bowel loops, mesenteric, and the abdominal wall musculature (image 83). Small amount of free-fluid, with obscuration of the fat planes. Trace fluid throughout the abdomen.  No free air identified. Configuration the right kidney collecting system unchanged from the comparison CT, with extrarenal pelvis and mild dilation the proximal ureter. Unremarkable course of the right ureter with no stone identified. Similar appearance of left-sided nephrolithiasis with unchanged appearance of the dilated collecting  system. The stones at the left ureteropelvic junction have increased in size. Unchanged position of distal left ureteral stone, just above the ureterovesical junction. This stone is slightly increased in size now measuring 11 mm. Significant inflammatory changes associated with the left collecting system, with thickening and enhancement of the proximal left ureter and ureterovesical junction. Symmetric perfusion of the kidneys maintained. Urinary bladder is decompressed with urinary catheter in situ. Gas within the dome of the urinary bladder, nonspecific. The dome of the bladder is indistinguishable from inflammatory changes in the low abdomen/ pelvis, described above. Multiple retroperitoneal lymph nodes. Fibroid changes of the uterus. Scattered calcifications of the abdominal aorta. No dissection or aneurysm. Atherosclerotic changes at the origin of the mesenteric vessels, with no significant stenosis or occlusion identified. Musculoskeletal: Degenerative changes of the spine. No significant bony canal narrowing. No displaced fracture. IMPRESSION: Evidence of developing bowel obstruction or ileus, with transition point in the distal ileum. Etiology may be secondary to adhesion of the right lower quadrant, or could potentially be reactive given the significant inflammatory changes involving the urinary system and the urinary bladder. Ill-defined inflammatory/ infectious changes of the low abdomen/ pelvis, where several viscera are involved. Inflammatory changes involve the dome of the urinary bladder, distal small bowel loops, and the sigmoid colon, with a source involving any of these. Favored etiology is secondary to chronic urinary tract infection given the imaging appearance over time and left-sided  obstructive nephrolithiasis, however, diverticulitis not excluded. Persistent at least partially obstructive left-sided nephrolithiasis, with stones at the ureteropelvic junction and in the distal ureter just above  the left ureterovesical junction. Inflammatory changes at the hilum of the left kidney have worsened since the comparison CT, compatible with pyelonephritis/urinary tract infection. Associated lymph nodes of the retroperitoneum. Aortic atherosclerosis. Trace bilateral pleural effusions. Signed, Dulcy Fanny. Earleen Newport, DO [facility] Electronically Signed   By: Corrie Mckusick D.O.   On: 12/22/2015 07:45   Assessment/Plan Abdominal distention - SBO vs ileus  - suspect ileus; no N/V or peritonitis  - NPO, IVF, bowel rest  Per medicine: Sepsis - ID consulted  Acute metabolic encephalopathy  Decubitus ulcer, present on admission Acute on chronic CKD II Obstructive nephrolithiasis, left Hypokalemia  hypernatremia Anemia of chronic disease DM II with nephropathy and neuropathy MS  FEN: NPO, IVF  ID: Zosyn day Day 7/7, Vanc Day 7/7  DVT Proph: Xarelto   Plan: conservative management with bowel rest and IVF. Suspect ileus, possibly secondary to pyelonephritis, decreased motility from MS, or adhesions at the area of the bladder . Recommend evaluation by Urology for stone at ureteropelvic junction.   Will will follow along this patients care and give surgical recommendations, appreciate the consult.  Jill Alexanders, [facility] Surgery 12/22/2015, 1:05 PM Pager: [phone] Consults: [phone] Mon-Fri 7:00 am-4:30 pm Sat-Sun 7:00 am-11:30 am

## 2015-12-22 NOTE — Progress Notes (Addendum)
Patient ID: [name], female   DOB: 07/29/1960, 55 y.o.   MRN: [id number]    PROGRESS NOTE    [name]  MCN:[id number] DOB: 05-29-1961 DOA: 12/18/2015  PCP: unknown    Brief Narrative:  55 year old Female, chronically ill looking, with history of MS, decubitus ulcers, documented AKI and CKD, DVT, CVA and malnutrition. Patient lives at home, but has neighbor and Home Health checking on her. On presentation to the hospital, UA suggestive of UTI, and CBC reveals elevated WBC with bandemia. Lactic acid is elevated, and Scr has risen from 0.54 to 3.32. Patient was on Metformin, bactrim, PRN lasix and Xarelto 20 MG po once daily prior to current admission.   Of note, pt was just recently discharged on November 18, 2015 after being treated for Sepsis due to Providencia and Coag Neg Staph Bacteremia and Pyelonephritis that was thought to be most likely from chronic indwelling catheter. Pt was discharged on Bactrim to complete therapy for 14 days upon discharge. During last admission, [place] team evaluated pt and noted several superficial dime sized decubitus ulcers on bilat buttocks and stage 4 sacral wounds that are now mostly healed w/ pink dry scar tissue with small remaining pressure injury in the center of each location; to sacrum, left and right ischium, each site is approx .1X.1X.1cm, pink and dry  Assessment & Plan:   Active Problems: Sepsis secondary to UTI from indwelling foley cath and left pyelonephritis, Staph and providencia stuarti bacteremia (3/4 blood cultures positive for staph), pt with neurogenic bladder  - pt met criteria for sepsis on admission with HR > 90 and WBC 14 K, source likely UTI, acute left pyelonephritis, ? Bacteremia, elevated lactic acid  - urine culture from 7/7 with providencia stuarti  and blood cultures from 7/7 with coag negative staph (3/4) and providencia stuarti  - unclear source, pt has chronic foley cath - continue vanc and zosyn day #5 for now - ID consulted   - repeat BMP and CBC in AM  Acute metabolic encephalopathy - secondary to the above imposed on dehydration, hypernatremia  - more alert, follows commands, very flat affect, unknown baseline  - refusing discussion about placement, says she wants to go home - I have not seen any family yet, asked RN to call me if family arrives   Decubitus ulcer, present on admission  - per last wound care report mostly healed but will ask wound care team to reassess  - keep on air-matress    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 Adhesions and constipation - surgery consulted - pt with no vomiting, will keep NPO until surgery team sees   Left-sided obstructive nephrolithiasis - partially obstructive left-sided nephrolithiasis, with stones at the ureteropelvic junction and in the distal ureter just above the left ureterovesical junction - Inflammatory changes at the hilum of the left kidney have worsened since the comparison  - will ask urologist to review  Hypokalemia - supplemented and WNL   Hypernatremia - Na is now WNL - BMP in AM  Anemia of chronic disease  - no signs of bleeding  - CBC in AM  Hx of DVT  - on xarelto   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  Objective: Filed Vitals:   12/21/15 0620 12/21/15 1432 12/21/15 2222  12/22/15 0507  BP: 111/64 128/64 148/73 152/72  Pulse: 80 88 87 81  Temp: 98.6 F (37 C) 99 F (37.2 C) 98.7 F (37.1 C) 99.7 F (37.6 C)  TempSrc:      Resp: _0 Height:      Weight:      SpO2: 100% 93% 100% 94%    Intake/Output Summary (Last 24 hours) at 12/22/15 1038 Last data filed at 12/22/15 0600  Gross per 24 hour  Intake   1848 ml  Output    850 ml  Net    998 ml   Filed Weights   12/18/15 1031 12/18/15 1448  Weight: 77.111 kg (170 lb) 63.6 kg (140 lb 3.4 oz)    Examination:  General exam: Appears more alert, flat affect  Respiratory system: Respiratory effort normal. Diminished breath sounds at bases  Cardiovascular system: RRR. No JVD, murmurs, rubs, gallops or clicks. No pedal edema. Gastrointestinal system: Abdomen is distended, tympanic, slightly tender in epigastric area, diminished bowel sounds  Central nervous system: Alert strength in upper and lower extremities 2/5, sensation diminished in upper and lower extremities   Data Reviewed: I have personally reviewed following labs and imaging studies  CBC:  Recent Labs Lab 12/18/15 1035 12/19/15 0744 12/20/15 0646 12/21/15 0559  WBC 14.2* 14.8* 17.4* 15.9*  NEUTROABS 12.5*  --   --   --   HGB 10.6* 9.7* 10.4* 8.2*  HCT 32.6* 31.0* 33.1* 25.8*  MCV 84.7 86.4 86.6 83.8  PLT 345 360 361 007   Basic Metabolic Panel:  Recent Labs Lab 12/18/15 1035 12/19/15 0744 12/20/15 0646 12/21/15 0559 12/22/15 0735  NA 143 149* 149* 143 139  K 4.8 3.3* 3.1* 2.9* 4.3  CL 116* 122* 120* 115* 108  CO2 20* 21* 21* 21* 26  GLUCOSE 198* 108* 112* 184* 152*  BUN 62* 40* 29* 19 10  CREATININE 3.32* 1.48* 0.84 0.67 0.59  CALCIUM 9.1 9.2 9.8 9.1 9.0   Liver Function Tests:  Recent Labs Lab 12/18/15 1035 12/19/15 0744  AST 15 12*  ALT 8* 7*  ALKPHOS 40 50  BILITOT 0.7 0.5  PROT 6.6 6.6  ALBUMIN 2.2* 2.3*   CBG:  Recent Labs Lab 12/21/15 0819 12/21/15 1209 12/21/15 1611 12/21/15 2254  12/22/15 0730  GLUCAP 170* 155* 176* 168* 158*   Urine analysis:    Component Value Date/Time   COLORURINE YELLOW 12/18/2015 1118   APPEARANCEUR TURBID* 12/18/2015 1118   LABSPEC 1.014 12/18/2015 1118   PHURINE 8.0 12/18/2015 1118   GLUCOSEU NEGATIVE 12/18/2015 1118   HGBUR MODERATE* 12/18/2015 1118   BILIRUBINUR NEGATIVE 12/18/2015 1118   KETONESUR NEGATIVE 12/18/2015 1118   PROTEINUR 100* 12/18/2015 1118   UROBILINOGEN 0.2 12/20/2014 1851   NITRITE NEGATIVE 12/18/2015 1118   LEUKOCYTESUR LARGE* 12/18/2015 1118    Recent Results (from the past 240 hour(s))  Blood culture (routine x 2)  Status: Abnormal   Collection Time: 12/18/15 10:35 AM  Result Value Ref Range Status   Specimen Description BLOOD LEFT HAND  Final   Special Requests BOTTLES DRAWN AEROBIC AND ANAEROBIC  5CC  Final   Culture  Setup Time   Final    GRAM POSITIVE COCCI IN CLUSTERS IN BOTH AEROBIC AND ANAEROBIC BOTTLES CRITICAL RESULT CALLED TO, READ BACK BY AND VERIFIED WITH: J MARKEL,PHARMD AT 1610 12/19/15 BY L BENFIELD    Culture STAPHYLOCOCCUS SPECIES (COAGULASE NEGATIVE) (A)  Final   Report Status 12/21/2015 FINAL  Final   Organism ID, Bacteria STAPHYLOCOCCUS SPECIES (COAGULASE NEGATIVE)  Final      Susceptibility   Staphylococcus species (coagulase negative) - MIC*    CIPROFLOXACIN 1 SENSITIVE Sensitive     ERYTHROMYCIN <=0.25 SENSITIVE Sensitive     GENTAMICIN <=0.5 SENSITIVE Sensitive     OXACILLIN 0.5 RESISTANT Resistant     TETRACYCLINE <=1 SENSITIVE Sensitive     VANCOMYCIN 1 SENSITIVE Sensitive     TRIMETH/SULFA 20 SENSITIVE Sensitive     CLINDAMYCIN <=0.25 SENSITIVE Sensitive     RIFAMPIN <=0.5 SENSITIVE Sensitive     Inducible Clindamycin NEGATIVE Sensitive     * STAPHYLOCOCCUS SPECIES (COAGULASE NEGATIVE)  Blood Culture ID Panel (Reflexed)     Status: Abnormal   Collection Time: 12/18/15 10:35 AM  Result Value Ref Range Status   Enterococcus species NOT DETECTED NOT DETECTED Final    Vancomycin resistance NOT DETECTED NOT DETECTED Final   Listeria monocytogenes NOT DETECTED NOT DETECTED Final   Staphylococcus species DETECTED (A) NOT DETECTED Final    Comment: CRITICAL RESULT CALLED TO, READ BACK BY AND VERIFIED WITH: J MARKEL,PHARMD AT 1417 12/19/15 BY L BENFIELD    Staphylococcus aureus NOT DETECTED NOT DETECTED Final   Methicillin resistance DETECTED (A) NOT DETECTED Final    Comment: CRITICAL RESULT CALLED TO, READ BACK BY AND VERIFIED WITH: J MARKEL,PHARMD AT 1417 12/19/15 BY L BENFIELD    Streptococcus species NOT DETECTED NOT DETECTED Final   Streptococcus agalactiae NOT DETECTED NOT DETECTED Final   Streptococcus pneumoniae NOT DETECTED NOT DETECTED Final   Streptococcus pyogenes NOT DETECTED NOT DETECTED Final   Acinetobacter baumannii NOT DETECTED NOT DETECTED Final   Enterobacteriaceae species NOT DETECTED NOT DETECTED Final   Enterobacter cloacae complex NOT DETECTED NOT DETECTED Final   Escherichia coli NOT DETECTED NOT DETECTED Final   Klebsiella oxytoca NOT DETECTED NOT DETECTED Final   Klebsiella pneumoniae NOT DETECTED NOT DETECTED Final   Proteus species NOT DETECTED NOT DETECTED Final   Serratia marcescens NOT DETECTED NOT DETECTED Final   Carbapenem resistance NOT DETECTED NOT DETECTED Final   Haemophilus influenzae NOT DETECTED NOT DETECTED Final   Neisseria meningitidis NOT DETECTED NOT DETECTED Final   Pseudomonas aeruginosa NOT DETECTED NOT DETECTED Final   Candida albicans NOT DETECTED NOT DETECTED Final   Candida glabrata NOT DETECTED NOT DETECTED Final   Candida krusei NOT DETECTED NOT DETECTED Final   Candida parapsilosis NOT DETECTED NOT DETECTED Final   Candida tropicalis NOT DETECTED NOT DETECTED Final  Urine culture     Status: Abnormal   Collection Time: 12/18/15 11:18 AM  Result Value Ref Range Status   Specimen Description URINE, CATHETERIZED  Final   Special Requests Normal  Final   Culture >=100,000 COLONIES/mL PROVIDENCIA  STUARTII (A)  Final   Report Status 12/20/2015 FINAL  Final   Organism ID, Bacteria PROVIDENCIA STUARTII (A)  Final      Susceptibility  Providencia stuartii - MIC*    AMPICILLIN 16 RESISTANT Resistant     CEFAZOLIN >=64 RESISTANT Resistant     CEFTRIAXONE <=1 SENSITIVE Sensitive     CIPROFLOXACIN >=4 RESISTANT Resistant     GENTAMICIN >=16 RESISTANT Resistant     IMIPENEM 2 SENSITIVE Sensitive     NITROFURANTOIN 128 RESISTANT Resistant     TRIMETH/SULFA 40 SENSITIVE Sensitive     AMPICILLIN/SULBACTAM 8 SENSITIVE Sensitive     PIP/TAZO <=4 SENSITIVE Sensitive     * >=100,000 COLONIES/mL PROVIDENCIA STUARTII  Blood culture (routine x 2)     Status: None (Preliminary result)   Collection Time: 12/18/15 12:00 PM  Result Value Ref Range Status   Specimen Description BLOOD RIGHT HAND  Final   Special Requests IN PEDIATRIC BOTTLE  3CC  Final   Culture  Setup Time   Final    GRAM POSITIVE COCCI IN CLUSTERS AEROBIC BOTTLE ONLY CRITICAL RESULT CALLED TO, READ BACK BY AND VERIFIED WITH: J MARKEL,PHARMD AT 1417 12/19/15 BY L BENFIELD    Culture CULTURE REINCUBATED FOR BETTER GROWTH  Final   Report Status PENDING  Incomplete  MRSA PCR Screening     Status: None   Collection Time: 12/18/15  3:00 PM  Result Value Ref Range Status   MRSA by PCR NEGATIVE NEGATIVE Final    Comment:        The GeneXpert MRSA Assay (FDA approved for NASAL specimens only), is one component of a comprehensive MRSA colonization surveillance program. It is not intended to diagnose MRSA infection nor to guide or monitor treatment for MRSA infections.   Culture, blood (routine x 2)     Status: Abnormal   Collection Time: 12/18/15  7:57 PM  Result Value Ref Range Status   Specimen Description BLOOD LEFT ANTECUBITAL  Final   Special Requests IN PEDIATRIC BOTTLE  3CC  Final   Culture  Setup Time   Final    GRAM NEGATIVE RODS AEROBIC BOTTLE ONLY CRITICAL RESULT CALLED TO, READ BACK BY AND VERIFIED WITH: J MARKEL  12/19/15 @ 1553 M VESTAL    Culture PROVIDENCIA STUARTII (A)  Final   Report Status 12/21/2015 FINAL  Final   Organism ID, Bacteria PROVIDENCIA STUARTII  Final      Susceptibility   Providencia stuartii - MIC*    AMPICILLIN 16 RESISTANT Resistant     CEFAZOLIN >=64 RESISTANT Resistant     CEFEPIME <=1 SENSITIVE Sensitive     CEFTAZIDIME <=1 SENSITIVE Sensitive     CEFTRIAXONE <=1 SENSITIVE Sensitive     CIPROFLOXACIN >=4 RESISTANT Resistant     GENTAMICIN 8 RESISTANT Resistant     IMIPENEM 1 SENSITIVE Sensitive     TRIMETH/SULFA <=20 SENSITIVE Sensitive     AMPICILLIN/SULBACTAM 4 SENSITIVE Sensitive     PIP/TAZO <=4 SENSITIVE Sensitive     * PROVIDENCIA STUARTII  Blood Culture ID Panel (Reflexed)     Status: None   Collection Time: 12/18/15  7:57 PM  Result Value Ref Range Status   Enterococcus species NOT DETECTED NOT DETECTED Final   Vancomycin resistance NOT DETECTED NOT DETECTED Final   Listeria monocytogenes NOT DETECTED NOT DETECTED Final   Staphylococcus species NOT DETECTED NOT DETECTED Final   Staphylococcus aureus NOT DETECTED NOT DETECTED Final   Methicillin resistance NOT DETECTED NOT DETECTED Final   Streptococcus species NOT DETECTED NOT DETECTED Final   Streptococcus agalactiae NOT DETECTED NOT DETECTED Final   Streptococcus pneumoniae NOT DETECTED NOT DETECTED Final   Streptococcus  pyogenes NOT DETECTED NOT DETECTED Final   Acinetobacter baumannii NOT DETECTED NOT DETECTED Final   Enterobacteriaceae species NOT DETECTED NOT DETECTED Final   Enterobacter cloacae complex NOT DETECTED NOT DETECTED Final   Escherichia coli NOT DETECTED NOT DETECTED Final   Klebsiella oxytoca NOT DETECTED NOT DETECTED Final   Klebsiella pneumoniae NOT DETECTED NOT DETECTED Final   Proteus species NOT DETECTED NOT DETECTED Final   Serratia marcescens NOT DETECTED NOT DETECTED Final   Carbapenem resistance NOT DETECTED NOT DETECTED Final   Haemophilus influenzae NOT DETECTED NOT  DETECTED Final   Neisseria meningitidis NOT DETECTED NOT DETECTED Final   Pseudomonas aeruginosa NOT DETECTED NOT DETECTED Final   Candida albicans NOT DETECTED NOT DETECTED Final   Candida glabrata NOT DETECTED NOT DETECTED Final   Candida krusei NOT DETECTED NOT DETECTED Final   Candida parapsilosis NOT DETECTED NOT DETECTED Final   Candida tropicalis NOT DETECTED NOT DETECTED Final  Culture, blood (routine x 2)     Status: Abnormal   Collection Time: 12/18/15  8:21 PM  Result Value Ref Range Status   Specimen Description BLOOD RIGHT ANTECUBITAL  Final   Special Requests IN PEDIATRIC BOTTLE  1CC  Final   Culture  Setup Time   Final    GRAM POSITIVE COCCI IN CLUSTERS AEROBIC BOTTLE ONLY CRITICAL RESULT CALLED TO, READ BACK BY AND VERIFIED WITH: J MARKEL 12/19/15 @ 1607 M VESTAL    Culture STAPHYLOCOCCUS SPECIES (COAGULASE NEGATIVE) (A)  Final   Report Status 12/22/2015 FINAL  Final   Organism ID, Bacteria STAPHYLOCOCCUS SPECIES (COAGULASE NEGATIVE)  Final      Susceptibility   Staphylococcus species (coagulase negative) - MIC*    CIPROFLOXACIN >=8 RESISTANT Resistant     ERYTHROMYCIN 0.5 SENSITIVE Sensitive     GENTAMICIN <=0.5 SENSITIVE Sensitive     OXACILLIN >=4 RESISTANT Resistant     TETRACYCLINE <=1 SENSITIVE Sensitive     VANCOMYCIN 1 SENSITIVE Sensitive     TRIMETH/SULFA 80 RESISTANT Resistant     CLINDAMYCIN <=0.25 SENSITIVE Sensitive     RIFAMPIN <=0.5 SENSITIVE Sensitive     Inducible Clindamycin NEGATIVE Sensitive     * STAPHYLOCOCCUS SPECIES (COAGULASE NEGATIVE)    Radiology Studies: Ct Abdomen Pelvis W Contrast 12/22/2015  Evidence of developing bowel obstruction or ileus, with transition point in the distal ileum. Etiology may be secondary to adhesion of the right lower quadrant, or could potentially be reactive given the significant inflammatory changes involving the urinary system and the urinary bladder. Ill-defined inflammatory/ infectious changes of the low  abdomen/ pelvis, where several viscera are involved. Inflammatory changes involve the dome of the urinary bladder, distal small bowel loops, and the sigmoid colon, with a source involving any of these. Favored etiology is secondary to chronic urinary tract infection given the imaging appearance over time and left-sided obstructive nephrolithiasis, however, diverticulitis not excluded. Persistent at least partially obstructive left-sided nephrolithiasis, with stones at the ureteropelvic junction and in the distal ureter just above the left ureterovesical junction. Inflammatory changes at the hilum of the left kidney have worsened since the comparison CT, compatible with pyelonephritis/urinary tract infection. Associated lymph nodes of the retroperitoneum. Aortic atherosclerosis. Trace bilateral pleural effusions.   Dg Abd 2 Views 12/20/2015  Diffuse gaseous distention of the small and large bowel, most suggestive of diffuse adynamic ileus. Distal small bowel obstruction cannot be excluded. If there is clinical concern for small bowel obstruction, consider correlation with CT abdomen/pelvis with oral and IV contrast. 2. Left  renal stones.  Possible distal left ureteral stones.   Scheduled Meds: . baclofen  10 mg Oral TID  . darifenacin  7.5 mg Oral Daily  . famotidine  20 mg Oral Daily  . feeding supplement (ENSURE ENLIVE)  237 mL Oral BID BM  . gabapentin  100 mg Oral QHS  . insulin aspart  0-5 Units Subcutaneous QHS  . insulin aspart  0-9 Units Subcutaneous TID WC  . multivitamin with minerals  1 tablet Oral Daily  . piperacillin-tazobactam (ZOSYN)  IV  3.375 g Intravenous Q8H  . polyethylene glycol  17 g Oral Daily  . pravastatin  20 mg Oral q1800  . rivaroxaban  20 mg Oral Q supper  . senna-docusate  1 tablet Oral BID  . vancomycin  750 mg Intravenous Q12H  . vitamin C  500 mg Oral Daily   Continuous Infusions: . dextrose 5 % with kcl 75 mL/hr at 12/22/15 0117    LOS: 4 days   Time spent:  20 minutes   Faye Ramsay, MD [facility] Pager [phone]  If 7PM-7AM, please contact night-coverage [email or web address] Password [facility] 12/22/2015, 10:38 AM

## 2015-12-22 NOTE — Progress Notes (Signed)
   12/22/15 1500  Clinical Encounter Type  Visited With Patient  Visit Type Follow-up  Spiritual Encounters  Spiritual Needs Literature  Stress Factors  Patient Stress Factors Exhausted   Chaplain visited with patient to update AD paperwork.  Patient indicated that she wanted to do the paperwork with her cousin present. Chaplain advised to have nurse call when the cousin was present in order to have Chaplain help with AD.  Vilinda Blanks Yvana Samonte 12/22/2015 3:08 PM

## 2015-12-22 NOTE — Progress Notes (Signed)
Pharmacy Antibiotic Note  [name] is a 55 y.o. female admitted on 12/18/2015 with sepsis.  Pharmacy has been consulted for Zosyn and vancomycin dosing.  Day #5 of abx for urosepsis / bacteremia. Afebrile (99.7), WBC elevated and trending down 17.4>14.8. Continuing vancomycin and transitioning Zosyn to ceftriaxone today.  Plan: Start ceftriaxone 2g IV Q24 Change vancomycin to 750mg  IV Q12 Monitor clinical picture, renal function, VT at Css F/U C&S, abx deescalation / LOT  Height: 5\' 3"  (160 cm) Weight: 140 lb 3.4 oz (63.6 kg) IBW/kg (Calculated) : 52.4  Temp (24hrs), Avg:99.2 F (37.3 C), Min:98.7 F (37.1 C), Max:99.7 F (37.6 C)   Recent Labs Lab 12/18/15 1035 12/18/15 1049 12/18/15 2014 12/19/15 0008 12/19/15 0744 12/19/15 1607 12/19/15 1812 12/20/15 0646 12/21/15 0559 12/22/15 0735  WBC 14.2*  --   --   --  14.8*  --   --  17.4* 15.9* 14.8*  CREATININE 3.32*  --   --   --  1.48*  --   --  0.84 0.67 0.59  LATICACIDVEN  --  2.46* 1.1 2.4*  --  2.1* 2.1*  --   --   --     Estimated Creatinine Clearance: 69.7 mL/min (by C-G formula based on Cr of 0.59).    No Known Allergies  Antimicrobials this admission: Zosyn  7/7 >> 7/11 Vancomycin 7/7 >>  Ceftriaxone 7/11 >>   Dose adjustments this admission: N/A  Microbiology results: 7/7 BCx: GNR also with 2/2 CoNS? 7/7 UCx: > 100k of Providencia stuartii (GNR) 7/7 MRSA PCR: negative 7/11 BCx: sent  Thank you for allowing pharmacy to be a part of this patient's care.  Elenor Quinones, PharmD, BCPS Clinical Pharmacist Pager [phone] 12/22/2015 3:41 PM

## 2015-12-22 NOTE — Discharge Instructions (Addendum)

## 2015-12-22 NOTE — Progress Notes (Addendum)
Speech Language Pathology Treatment: Dysphagia  Patient Details Name: [name] MRN: [id number] DOB: 08/31/1960 Today's Date: 12/22/2015 Time: [id number] SLP Time Calculation (min) (ACUTE ONLY): 9 min  Assessment / Plan / Recommendation Clinical Impression  Very brief session due to toileting needs. Pt reluctant to take solid PO, less than 10% of am meal consumed, appears fatigued and claims she does not like the food and is tired of eggs. Pt repositioned and during this process pt has some hard coughing, likely due to aspiration of secretions and struggled to recover from this. Pt did request water and took several small sips without immediate signs of aspiration, though swallow was delayed slightly. Will continue to follow for tolerance, pt does appear to be at ongoing risk of aspiration. Reinforced need for upright positioning with RN. Se reports pt struggled with dys 2 texture, but tolerated purees much better in am. Will downgrade pt back to dys 1 (puree) to facilitate intake.     HPI HPI: [name] is a 55 y.o. female, chronically ill looking, with history of MS, decubitus ulcers, documented AKI and CKD, UTI, DVT, CVA and malnutrition, admitted on 12/18/2015 with sepsis, UA done suggestive of UTI.       SLP Plan  Continue with current plan of care     Recommendations  Diet recommendations: Dysphagia 2 (fine chop);Thin liquid Liquids provided via: Straw;Cup Medication Administration: Whole meds with liquid Supervision: Staff to assist with self feeding;Full supervision/cueing for compensatory strategies Compensations: Slow rate;Small sips/bites;Lingual sweep for clearance of pocketing Postural Changes and/or Swallow Maneuvers: Seated upright 90 degrees             Oral Care Recommendations: Oral care BID Follow up Recommendations: None Plan: Continue with current plan of care     Patricia Elva Breaker, MA CCC-SLP [id number]  [name] 12/22/2015, 11:01 AM

## 2015-12-22 NOTE — Consult Note (Addendum)
WOC wound consult note Reason for Consult: Consult requested for sacrum, bilat ischium, bilat heels.  Pt is familiar to [facility] team from recent admission on 6/6. Wound type: Pt has pink dry scar tissue to bilat heels, bilat ischium, and sacrum from previous pressure injuries which have healed.  No topical treatment is indicated at this time. Please re-consult if further assistance is needed.  Thank-you,  Julien Girt MSN, [place], [place], [place], [place]

## 2015-12-23 DIAGNOSIS — T83510A Infection and inflammatory reaction due to cystostomy catheter, initial encounter: Secondary | ICD-10-CM

## 2015-12-23 LAB — BASIC METABOLIC PANEL
ANION GAP: 3 — AB (ref 5–15)
BUN: 6 mg/dL (ref 6–20)
CHLORIDE: 107 mmol/L (ref 101–111)
CO2: 27 mmol/L (ref 22–32)
Calcium: 9.1 mg/dL (ref 8.9–10.3)
Creatinine, Ser: 0.51 mg/dL (ref 0.44–1.00)
GFR calc non Af Amer: >60 mL/min (ref >60)
GLUCOSE: 167 mg/dL — AB (ref 65–99)
POTASSIUM: 4 mmol/L (ref 3.5–5.1)
SODIUM: 137 mmol/L (ref 135–145)

## 2015-12-23 LAB — GLUCOSE, CAPILLARY
Glucose-Capillary: 162 mg/dL — ABNORMAL HIGH (ref 65–99)
Glucose-Capillary: 192 mg/dL — ABNORMAL HIGH (ref 65–99)
Glucose-Capillary: 153 mg/dL — ABNORMAL HIGH (ref 65–99)
Glucose-Capillary: 252 mg/dL — ABNORMAL HIGH (ref 65–99)
Glucose-Capillary: 187 mg/dL — ABNORMAL HIGH (ref 65–99)

## 2015-12-23 LAB — CBC
HCT: 26.2 % — ABNORMAL LOW (ref 36.0–46.0)
HEMOGLOBIN: 8.4 g/dL — AB (ref 12.0–15.0)
MCH: 26.8 pg (ref 26.0–34.0)
MCHC: 32.1 g/dL (ref 30.0–36.0)
MCV: 83.4 fL (ref 78.0–100.0)
Platelets: 382 10*3/uL (ref 150–400)
RBC: 3.14 MIL/uL — AB (ref 3.87–5.11)
RDW: 19.1 % — ABNORMAL HIGH (ref 11.5–15.5)
WBC: 14.7 10*3/uL — AB (ref 4.0–10.5)

## 2015-12-23 LAB — VANCOMYCIN, TROUGH: VANCOMYCIN TR: 17 ug/mL (ref 15–20)

## 2015-12-23 LAB — CULTURE, BLOOD (ROUTINE X 2)

## 2015-12-23 NOTE — Progress Notes (Signed)
Patient ID: [name], female   DOB: Jan 29, 1961, 55 y.o.   MRN: [id number]    PROGRESS NOTE    [name]  VOH:[id number] DOB: 22-Apr-1961 DOA: 12/18/2015  PCP: unknown    Brief Narrative:  55 year old Female, chronically ill looking, with history of MS, decubitus ulcers, documented AKI and CKD, DVT, CVA and malnutrition. Patient lives at home, but has neighbor and Home Health checking on her. On presentation to the hospital, UA suggestive of UTI, and CBC reveals elevated WBC with bandemia. Lactic acid is elevated, and Scr has risen from 0.54 to 3.32. Patient was on Metformin, bactrim, PRN lasix and Xarelto 20 MG po once daily prior to current admission.   Of note, pt was just recently discharged on November 18, 2015 after being treated for Sepsis due to Providencia and Coag Neg Staph Bacteremia and Pyelonephritis that was thought to be most likely from chronic indwelling catheter. Pt was discharged on Bactrim to complete therapy for 14 days upon discharge. During last admission, [place] team evaluated pt and noted several superficial dime sized decubitus ulcers on bilat buttocks and stage 4 sacral wounds that are now mostly healed w/ pink dry scar tissue with small remaining pressure injury in the center of each location; to sacrum, left and right ischium, each site is approx .1X.1X.1cm, pink and dry  Assessment & Plan:   Active Problems: Sepsis secondary to UTI from indwelling foley cath and left pyelonephritis, Staph and providencia stuarti bacteremia (3/4 blood cultures positive for staph), pt with neurogenic bladder  - pt met criteria for sepsis on admission with HR > 90 and WBC 14 K, source likely UTI, acute left pyelonephritis, ? Bacteremia, elevated lactic acid  - urine culture from 7/7 with providencia stuarti  and blood cultures from 7/7 with coag negative staph (3/4) and providencia stuarti  - unclear source, pt has chronic foley cath - continue vanc and zosyn day #6 for now - ID consulted  and assistance is appreciated  - repeat BMP and CBC in AM  Acute metabolic encephalopathy - secondary to the above imposed on dehydration, hypernatremia  - more alert, follows commands, very flat affect, unknown baseline  - I have not seen any family yet, asked RN to call me if family arrives   Decubitus ulcer, present on admission  - per last wound care report mostly healed, WOC team re evaluated, stable  - keep on air-matress    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 Adhesions and constipation - surgery consulted - pt with no vomiting, will keep NPO until surgery team sees   Left-sided obstructive nephrolithiasis - partially obstructive left-sided nephrolithiasis, with stones at the ureteropelvic junction and in the distal ureter just above the left ureterovesical junction - Inflammatory changes at the hilum of the left kidney have worsened since the comparison  - urology team consulted, awaiting input   Hypokalemia - supplemented and WNL   Hypernatremia - Na is now WNL - BMP in AM  Anemia of chronic disease  - no signs of bleeding  - CBC in AM  Hx of DVT  - on xarelto   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  Objective: Filed Vitals:   12/21/15 2222 12/22/15 0507 12/22/15 2112 12/23/15 0548  BP: 148/73 152/72  130/72 132/74  Pulse: 87 81 89 98  Temp: 98.7 F (37.1 C) 99.7 F (37.6 C) 99.6 F (37.6 C) 98.7 F (37.1 C)  TempSrc:    Oral  Resp: '16 16 16 16  ' Height:      Weight:      SpO2: 100% 94% 97% 97%    Intake/Output Summary (Last 24 hours) at 12/23/15 0847 Last data filed at 12/23/15 0651  Gross per 24 hour  Intake 2513.75 ml  Output   3500 ml  Net -986.25 ml   Filed Weights   12/18/15 1031 12/18/15 1448  Weight: 77.111 kg (170 lb) 63.6 kg (140 lb 3.4 oz)    Examination:  General exam: Appears more alert, flat affect  Respiratory system: Respiratory effort normal. Diminished breath sounds at bases  Cardiovascular system: RRR. No JVD, murmurs, rubs, gallops or clicks. No pedal edema. Gastrointestinal system: Abdomen is distended, tympanic, slightly tender in epigastric area, diminished bowel sounds  Central nervous system: Alert strength in upper and lower extremities 2/5, sensation diminished in upper and lower extremities   Data Reviewed: I have personally reviewed following labs and imaging studies  CBC:  Recent Labs Lab 12/18/15 1035 12/19/15 0744 12/20/15 0646 12/21/15 0559 12/22/15 0735 12/23/15 0219  WBC 14.2* 14.8* 17.4* 15.9* 14.8* 14.7*  NEUTROABS 12.5*  --   --   --   --   --   HGB 10.6* 9.7* 10.4* 8.2* 8.1* 8.4*  HCT 32.6* 31.0* 33.1* 25.8* 24.6* 26.2*  MCV 84.7 86.4 86.6 83.8 83.7 83.4  PLT 345 360 361 359 353 893   Basic Metabolic Panel:  Recent Labs Lab 12/19/15 0744 12/20/15 0646 12/21/15 0559 12/22/15 0735 12/23/15 0219  NA 149* 149* 143 139 137  K 3.3* 3.1* 2.9* 4.3 4.0  CL 122* 120* 115* 108 107  CO2 21* 21* 21* 26 27  GLUCOSE 108* 112* 184* 152* 167*  BUN 40* 29* '19 10 6  ' CREATININE 1.48* 0.84 0.67 0.59 0.51  CALCIUM 9.2 9.8 9.1 9.0 9.1   Liver Function Tests:  Recent Labs Lab 12/18/15 1035 12/19/15 0744  AST 15 12*  ALT 8* 7*  ALKPHOS 40 50  BILITOT 0.7 0.5  PROT 6.6 6.6  ALBUMIN 2.2* 2.3*   CBG:  Recent Labs Lab  12/22/15 1214 12/22/15 1643 12/22/15 2108 12/23/15 0123 12/23/15 0548  GLUCAP 163* 174* 177* 162* 192*   Urine analysis:    Component Value Date/Time   COLORURINE YELLOW 12/18/2015 1118   APPEARANCEUR TURBID* 12/18/2015 1118   LABSPEC 1.014 12/18/2015 1118   PHURINE 8.0 12/18/2015 1118   GLUCOSEU NEGATIVE 12/18/2015 1118   HGBUR MODERATE* 12/18/2015 1118   BILIRUBINUR NEGATIVE 12/18/2015 1118   KETONESUR NEGATIVE 12/18/2015 1118   PROTEINUR 100* 12/18/2015 1118   UROBILINOGEN 0.2 12/20/2014 1851   NITRITE NEGATIVE 12/18/2015 1118   LEUKOCYTESUR LARGE* 12/18/2015 1118    Recent Results (from the past 240 hour(s))  Blood  culture (routine x 2)     Status: Abnormal   Collection Time: 12/18/15 10:35 AM  Result Value Ref Range Status   Specimen Description BLOOD LEFT HAND  Final   Special Requests BOTTLES DRAWN AEROBIC AND ANAEROBIC  5CC  Final   Culture  Setup Time   Final    GRAM POSITIVE COCCI IN CLUSTERS IN BOTH AEROBIC AND ANAEROBIC BOTTLES CRITICAL RESULT CALLED TO, READ BACK BY AND VERIFIED WITH: J MARKEL,PHARMD AT 6579 12/19/15 BY L BENFIELD    Culture STAPHYLOCOCCUS SPECIES (COAGULASE NEGATIVE) (A)  Final   Report Status 12/21/2015 FINAL  Final   Organism ID, Bacteria STAPHYLOCOCCUS SPECIES (COAGULASE NEGATIVE)  Final      Susceptibility   Staphylococcus species (coagulase negative) - MIC*    CIPROFLOXACIN 1 SENSITIVE Sensitive     ERYTHROMYCIN <=0.25 SENSITIVE Sensitive     GENTAMICIN <=0.5 SENSITIVE Sensitive     OXACILLIN 0.5 RESISTANT Resistant     TETRACYCLINE <=1 SENSITIVE Sensitive     VANCOMYCIN 1 SENSITIVE Sensitive     TRIMETH/SULFA 20 SENSITIVE Sensitive     CLINDAMYCIN <=0.25 SENSITIVE Sensitive     RIFAMPIN <=0.5 SENSITIVE Sensitive     Inducible Clindamycin NEGATIVE Sensitive     * STAPHYLOCOCCUS SPECIES (COAGULASE NEGATIVE)  Blood Culture ID Panel (Reflexed)     Status: Abnormal   Collection Time: 12/18/15 10:35 AM  Result Value Ref Range Status     Enterococcus species NOT DETECTED NOT DETECTED Final   Vancomycin resistance NOT DETECTED NOT DETECTED Final   Listeria monocytogenes NOT DETECTED NOT DETECTED Final   Staphylococcus species DETECTED (A) NOT DETECTED Final    Comment: CRITICAL RESULT CALLED TO, READ BACK BY AND VERIFIED WITH: J MARKEL,PHARMD AT 1417 12/19/15 BY L BENFIELD    Staphylococcus aureus NOT DETECTED NOT DETECTED Final   Methicillin resistance DETECTED (A) NOT DETECTED Final    Comment: CRITICAL RESULT CALLED TO, READ BACK BY AND VERIFIED WITH: J MARKEL,PHARMD AT 1417 12/19/15 BY L BENFIELD    Streptococcus species NOT DETECTED NOT DETECTED Final   Streptococcus agalactiae NOT DETECTED NOT DETECTED Final   Streptococcus pneumoniae NOT DETECTED NOT DETECTED Final   Streptococcus pyogenes NOT DETECTED NOT DETECTED Final   Acinetobacter baumannii NOT DETECTED NOT DETECTED Final   Enterobacteriaceae species NOT DETECTED NOT DETECTED Final   Enterobacter cloacae complex NOT DETECTED NOT DETECTED Final   Escherichia coli NOT DETECTED NOT DETECTED Final   Klebsiella oxytoca NOT DETECTED NOT DETECTED Final   Klebsiella pneumoniae NOT DETECTED NOT DETECTED Final   Proteus species NOT DETECTED NOT DETECTED Final   Serratia marcescens NOT DETECTED NOT DETECTED Final   Carbapenem resistance NOT DETECTED NOT DETECTED Final   Haemophilus influenzae NOT DETECTED NOT DETECTED Final   Neisseria meningitidis NOT DETECTED NOT DETECTED Final   Pseudomonas aeruginosa NOT DETECTED NOT DETECTED Final   Candida albicans NOT DETECTED NOT DETECTED Final   Candida glabrata NOT DETECTED NOT DETECTED Final   Candida krusei NOT DETECTED NOT DETECTED Final   Candida parapsilosis NOT DETECTED NOT DETECTED Final   Candida tropicalis NOT DETECTED NOT DETECTED Final  Urine culture     Status: Abnormal   Collection Time: 12/18/15 11:18 AM  Result Value Ref Range Status   Specimen Description URINE, CATHETERIZED  Final   Special Requests  Normal  Final   Culture >=100,000 COLONIES/mL PROVIDENCIA STUARTII (A)  Final   Report Status 12/20/2015 FINAL  Final   Organism ID, Bacteria PROVIDENCIA STUARTII (A)  Final      Susceptibility   Providencia stuartii - MIC*    AMPICILLIN 16 RESISTANT Resistant     CEFAZOLIN >=64 RESISTANT Resistant     CEFTRIAXONE <=1 SENSITIVE Sensitive     CIPROFLOXACIN >=4 RESISTANT Resistant     GENTAMICIN >=16 RESISTANT Resistant     IMIPENEM 2 SENSITIVE Sensitive     NITROFURANTOIN 128 RESISTANT Resistant     TRIMETH/SULFA 40 SENSITIVE Sensitive     AMPICILLIN/SULBACTAM 8 SENSITIVE Sensitive     PIP/TAZO <=4 SENSITIVE Sensitive     * >=100,000 COLONIES/mL PROVIDENCIA STUARTII  Blood culture (routine x 2)     Status: None (Preliminary result)   Collection Time: 12/18/15 12:00 PM  Result Value Ref Range Status   Specimen Description BLOOD RIGHT HAND  Final   Special Requests IN PEDIATRIC BOTTLE  3CC  Final   Culture  Setup Time   Final    GRAM POSITIVE COCCI IN CLUSTERS AEROBIC BOTTLE ONLY CRITICAL RESULT CALLED TO, READ BACK BY AND VERIFIED WITH: J MARKEL,PHARMD AT 1417 12/19/15 BY L BENFIELD    Culture CULTURE REINCUBATED FOR BETTER GROWTH  Final   Report Status PENDING  Incomplete  MRSA PCR Screening     Status: None   Collection Time: 12/18/15  3:00 PM  Result Value Ref Range Status   MRSA by PCR NEGATIVE NEGATIVE Final    Comment:        The GeneXpert MRSA Assay (FDA approved for NASAL specimens only), is one component of a comprehensive MRSA colonization surveillance program. It is not intended to diagnose MRSA infection nor to guide or monitor treatment for MRSA infections.   Culture, blood (routine x 2)     Status: Abnormal   Collection Time: 12/18/15  7:57 PM  Result Value Ref Range Status   Specimen Description BLOOD LEFT ANTECUBITAL  Final   Special Requests IN PEDIATRIC BOTTLE  3CC  Final   Culture  Setup Time   Final    GRAM NEGATIVE RODS AEROBIC BOTTLE ONLY CRITICAL  RESULT CALLED TO, READ BACK BY AND VERIFIED WITH: J MARKEL 12/19/15 @ 1553 M VESTAL    Culture PROVIDENCIA STUARTII (A)  Final   Report Status 12/21/2015 FINAL  Final   Organism ID, Bacteria PROVIDENCIA STUARTII  Final      Susceptibility   Providencia stuartii - MIC*    AMPICILLIN 16 RESISTANT Resistant     CEFAZOLIN >=64 RESISTANT Resistant     CEFEPIME <=1 SENSITIVE Sensitive     CEFTAZIDIME <=1 SENSITIVE Sensitive     CEFTRIAXONE <=1 SENSITIVE Sensitive     CIPROFLOXACIN >=4 RESISTANT Resistant     GENTAMICIN 8 RESISTANT Resistant     IMIPENEM 1 SENSITIVE Sensitive     TRIMETH/SULFA <=20 SENSITIVE Sensitive     AMPICILLIN/SULBACTAM 4 SENSITIVE Sensitive     PIP/TAZO <=4 SENSITIVE Sensitive     * PROVIDENCIA STUARTII  Blood Culture ID Panel (Reflexed)     Status: None   Collection Time: 12/18/15  7:57 PM  Result Value Ref Range Status   Enterococcus species NOT DETECTED NOT DETECTED Final   Vancomycin resistance NOT DETECTED NOT DETECTED Final   Listeria monocytogenes NOT DETECTED NOT DETECTED Final   Staphylococcus species NOT DETECTED NOT DETECTED Final   Staphylococcus aureus NOT DETECTED NOT DETECTED Final   Methicillin resistance NOT DETECTED NOT DETECTED Final   Streptococcus species NOT DETECTED NOT DETECTED Final   Streptococcus agalactiae NOT DETECTED NOT DETECTED Final   Streptococcus  pneumoniae NOT DETECTED NOT DETECTED Final   Streptococcus pyogenes NOT DETECTED NOT DETECTED Final   Acinetobacter baumannii NOT DETECTED NOT DETECTED Final   Enterobacteriaceae species NOT DETECTED NOT DETECTED Final   Enterobacter cloacae complex NOT DETECTED NOT DETECTED Final   Escherichia coli NOT DETECTED NOT DETECTED Final   Klebsiella oxytoca NOT DETECTED NOT DETECTED Final   Klebsiella pneumoniae NOT DETECTED NOT DETECTED Final   Proteus species NOT DETECTED NOT DETECTED Final   Serratia marcescens NOT DETECTED NOT DETECTED Final   Carbapenem resistance NOT DETECTED NOT  DETECTED Final   Haemophilus influenzae NOT DETECTED NOT DETECTED Final   Neisseria meningitidis NOT DETECTED NOT DETECTED Final   Pseudomonas aeruginosa NOT DETECTED NOT DETECTED Final   Candida albicans NOT DETECTED NOT DETECTED Final   Candida glabrata NOT DETECTED NOT DETECTED Final   Candida krusei NOT DETECTED NOT DETECTED Final   Candida parapsilosis NOT DETECTED NOT DETECTED Final   Candida tropicalis NOT DETECTED NOT DETECTED Final  Culture, blood (routine x 2)     Status: Abnormal   Collection Time: 12/18/15  8:21 PM  Result Value Ref Range Status   Specimen Description BLOOD RIGHT ANTECUBITAL  Final   Special Requests IN PEDIATRIC BOTTLE  1CC  Final   Culture  Setup Time   Final    GRAM POSITIVE COCCI IN CLUSTERS AEROBIC BOTTLE ONLY CRITICAL RESULT CALLED TO, READ BACK BY AND VERIFIED WITH: J MARKEL 12/19/15 @ 1607 M VESTAL    Culture STAPHYLOCOCCUS SPECIES (COAGULASE NEGATIVE) (A)  Final   Report Status 12/22/2015 FINAL  Final   Organism ID, Bacteria STAPHYLOCOCCUS SPECIES (COAGULASE NEGATIVE)  Final      Susceptibility   Staphylococcus species (coagulase negative) - MIC*    CIPROFLOXACIN >=8 RESISTANT Resistant     ERYTHROMYCIN 0.5 SENSITIVE Sensitive     GENTAMICIN <=0.5 SENSITIVE Sensitive     OXACILLIN >=4 RESISTANT Resistant     TETRACYCLINE <=1 SENSITIVE Sensitive     VANCOMYCIN 1 SENSITIVE Sensitive     TRIMETH/SULFA 80 RESISTANT Resistant     CLINDAMYCIN <=0.25 SENSITIVE Sensitive     RIFAMPIN <=0.5 SENSITIVE Sensitive     Inducible Clindamycin NEGATIVE Sensitive     * STAPHYLOCOCCUS SPECIES (COAGULASE NEGATIVE)    Radiology Studies: Ct Abdomen Pelvis W Contrast 12/22/2015  Evidence of developing bowel obstruction or ileus, with transition point in the distal ileum. Etiology may be secondary to adhesion of the right lower quadrant, or could potentially be reactive given the significant inflammatory changes involving the urinary system and the urinary bladder.  Ill-defined inflammatory/ infectious changes of the low abdomen/ pelvis, where several viscera are involved. Inflammatory changes involve the dome of the urinary bladder, distal small bowel loops, and the sigmoid colon, with a source involving any of these. Favored etiology is secondary to chronic urinary tract infection given the imaging appearance over time and left-sided obstructive nephrolithiasis, however, diverticulitis not excluded. Persistent at least partially obstructive left-sided nephrolithiasis, with stones at the ureteropelvic junction and in the distal ureter just above the left ureterovesical junction. Inflammatory changes at the hilum of the left kidney have worsened since the comparison CT, compatible with pyelonephritis/urinary tract infection. Associated lymph nodes of the retroperitoneum. Aortic atherosclerosis. Trace bilateral pleural effusions.   Dg Abd 2 Views 12/20/2015  Diffuse gaseous distention of the small and large bowel, most suggestive of diffuse adynamic ileus. Distal small bowel obstruction cannot be excluded. If there is clinical concern for small bowel obstruction, consider correlation with  CT abdomen/pelvis with oral and IV contrast. 2. Left renal stones.  Possible distal left ureteral stones.   Scheduled Meds: . baclofen  10 mg Oral TID  . cefTRIAXone (ROCEPHIN)  IV  2 g Intravenous Q24H  . darifenacin  7.5 mg Oral Daily  . famotidine  20 mg Oral Daily  . feeding supplement (ENSURE ENLIVE)  237 mL Oral BID BM  . gabapentin  100 mg Oral QHS  . insulin aspart  0-9 Units Subcutaneous Q4H  . multivitamin with minerals  1 tablet Oral Daily  . polyethylene glycol  17 g Oral Daily  . pravastatin  20 mg Oral q1800  . rivaroxaban  20 mg Oral Q supper  . senna-docusate  1 tablet Oral BID  . vancomycin  750 mg Intravenous Q12H  . vitamin C  500 mg Oral Daily   Continuous Infusions: . dextrose 5 % with kcl 75 mL/hr at 12/23/15 0428    LOS: 5 days   Time spent: 20  minutes   Faye Ramsay, MD [facility] Pager [phone]  If 7PM-7AM, please contact night-coverage [email or web address] Password [facility] 12/23/2015, 8:47 AM

## 2015-12-23 NOTE — Consult Note (Signed)
Urology Consult   Physician requesting consult: Dr. Mart Piggs  Reason for consult: left renal pelvis/ureteral stones  History of Present Illness: [name] is a 55 y.o. with history of MS, decubitus ulcers, DM2, recurrent UTI, DVT, CVA who presented on 12/18/15 after home health noted she was somnolent appearing. In the ED she did have hypotension with a WBC of 14K with left shift and lactate of 2.5 with creatinine of 3.3. Her UA was consistent with a UTI at that time. She has improved with resuscitation and broad spectrum abx with Vanc/Ceftriaxone and is currently afebrile although with persistent leukocytosis of 14K. Her creatinine has improved to 0.5. Blood and urine cultures have grown Provendicia Stuartii. Of note the patient does have a chronic indwelling foley but she is unsure why. She denies any history of kidney stones or bladder/kidney surgery. Currently, she endorses persistent abdominal pain but denies chills, nausea, vomiting. She is currently on Xarelto for history of CVA and DVT and last received this yesterday.   Past Medical History  Diagnosis Date  . Diabetes mellitus without complication ([place])   . Multiple sclerosis diagnosed 2002-not on Therapy any longer 06/26/2012  . Stage 4 decubitus ulcer ([place]) 07/05/2015  . Acute renal failure superimposed on stage 3 chronic kidney disease ([place]) 07/21/2015  . Malnutrition of moderate degree 07/21/2015  . UTI (lower urinary tract infection)   . DVT (deep venous thrombosis) ([place])   . Stroke [facility])     Past Surgical History  Procedure Laterality Date  . Hernia mesh removal    . Tubes tided    . Tubal ligation    . Esophagogastroduodenoscopy Left 01/02/2015    Procedure: ESOPHAGOGASTRODUODENOSCOPY (EGD);  Surgeon: Arta Silence, MD;  Location: [facility];  Service: Endoscopy;  Laterality: Left;     Current Hospital Medications:  Home meds:    Medication List    ASK your doctor about these medications        albuterol 108  (90 Base) MCG/ACT inhaler  Commonly known as:  PROVENTIL HFA;VENTOLIN HFA  Inhale 2 puffs into the lungs every 4 (four) hours as needed for wheezing or shortness of breath.     APAP 325 MG tablet  Take 650 mg by mouth every 6 (six) hours as needed for pain.     baclofen 10 MG tablet  Commonly known as:  LIORESAL  Take 1 tablet (10 mg total) by mouth 3 (three) times daily.     famotidine 20 MG tablet  Commonly known as:  PEPCID  Take 1 tablet (20 mg total) by mouth 2 (two) times daily.     feeding supplement (PRO-STAT SUGAR FREE 64) Liqd  Take 30 mLs by mouth 2 (two) times daily.     furosemide 40 MG tablet  Commonly known as:  LASIX  Take 1 tablet (40 mg total) by mouth daily as needed for fluid or edema. Reported on 07/05/2015     gabapentin 100 MG capsule  Commonly known as:  NEURONTIN  Take 1 capsule (100 mg total) by mouth at bedtime.     glipiZIDE 5 MG tablet  Commonly known as:  GLUCOTROL  Take 5 mg by mouth 2 (two) times daily.     HYDROcodone-acetaminophen 5-325 MG tablet  Commonly known as:  NORCO  Take 1 tablet by mouth every 6 (six) hours as needed for moderate pain.     lisinopril 2.5 MG tablet  Commonly known as:  PRINIVIL,ZESTRIL  Take 2.5 mg by mouth daily.  lovastatin 20 MG tablet  Commonly known as:  MEVACOR  Take 20 mg by mouth daily.     metFORMIN 500 MG tablet  Commonly known as:  GLUCOPHAGE  Take 1 tablet (500 mg total) by mouth 2 (two) times daily.     multivitamin with minerals Tabs tablet  Take 1 tablet by mouth daily.     Omega 3 1000 MG Caps  Take 1,000 mg by mouth daily.     polyethylene glycol packet  Commonly known as:  MIRALAX / GLYCOLAX  Take 17 g by mouth 2 (two) times daily.     pravastatin 20 MG tablet  Commonly known as:  PRAVACHOL  Take 1 tablet (20 mg total) by mouth daily at 6 PM.     rivaroxaban 20 MG Tabs tablet  Commonly known as:  XARELTO  Take 1 tablet (20 mg total) by mouth daily with supper. Reported on  08/12/2015     solifenacin 5 MG tablet  Commonly known as:  VESICARE  Take 1 tablet (5 mg total) by mouth daily.     sulfamethoxazole-trimethoprim 800-160 MG tablet  Commonly known as:  BACTRIM DS,SEPTRA DS  Take 1 tablet by mouth every 12 (twelve) hours.     vitamin C 500 MG tablet  Commonly known as:  ASCORBIC ACID  Take 500 mg by mouth daily.        Scheduled Meds: . baclofen  10 mg Oral TID  . cefTRIAXone (ROCEPHIN)  IV  2 g Intravenous Q24H  . darifenacin  7.5 mg Oral Daily  . famotidine  20 mg Oral Daily  . feeding supplement (ENSURE ENLIVE)  237 mL Oral BID BM  . gabapentin  100 mg Oral QHS  . insulin aspart  0-9 Units Subcutaneous Q4H  . multivitamin with minerals  1 tablet Oral Daily  . polyethylene glycol  17 g Oral Daily  . pravastatin  20 mg Oral q1800  . rivaroxaban  20 mg Oral Q supper  . senna-docusate  1 tablet Oral BID  . vancomycin  750 mg Intravenous Q12H  . vitamin C  500 mg Oral Daily   Continuous Infusions: . dextrose 5 % with kcl 75 mL/hr at 12/23/15 1508   PRN Meds:.albuterol, HYDROcodone-acetaminophen, morphine injection, ondansetron (ZOFRAN) IV  Allergies: No Known Allergies  Family History  Problem Relation Age of Onset  . Diabetes Mother   . Diabetes Father   . Diabetes Sister   . Scoliosis Sister   . Alzheimer's disease Mother     Social History:  reports that she quit smoking about 13 months ago. Her smoking use included Cigarettes. She has a 20 pack-year smoking history. She has never used smokeless tobacco. She reports that she drinks about 3.0 oz of alcohol per week. She reports that she does not use illicit drugs.  ROS: A complete review of systems was performed.  All systems are negative except for pertinent findings as noted.  Physical Exam:  Vital signs in last 24 hours: Temp:  [98.7 F (37.1 C)-99.6 F (37.6 C)] 98.7 F (37.1 C) (07/12 0548) Pulse Rate:  [89-98] 98 (07/12 0548) Resp:  [16] 16 (07/12 0548) BP:  (130-132)/(72-74) 132/74 mmHg (07/12 0548) SpO2:  [97 %] 97 % (07/12 0548) Constitutional:  Alert and oriented, No acute distress Cardiovascular: normal peripheral perfusion Respiratory: Normal respiratory effort  GI: Abdomen is soft, mildly tender to palpation in LLQ, nondistended, no abdominal masses. Well healed Pfannenstiel incision GU: No CVA tenderness Lymphatic: No lymphadenopathy Neurologic:  Grossly intact, no focal deficits Psychiatric: Normal mood and affect  Laboratory Data:   Recent Labs  12/21/15 0559 12/22/15 0735 12/23/15 0219  WBC 15.9* 14.8* 14.7*  HGB 8.2* 8.1* 8.4*  HCT 25.8* 24.6* 26.2*  PLT 359 353 382     Recent Labs  12/21/15 0559 12/22/15 0735 12/23/15 0219  NA 143 139 137  K 2.9* 4.3 4.0  CL 115* 108 107  GLUCOSE 184* 152* 167*  BUN 19 10 6   CALCIUM 9.1 9.0 9.1  CREATININE 0.67 0.59 0.51     Results for orders placed or performed during the hospital encounter of 12/18/15 (from the past 24 hour(s))  Culture, blood (Routine X 2) w Reflex to ID Panel     Status: None (Preliminary result)   Collection Time: 12/22/15  4:00 PM  Result Value Ref Range   Specimen Description BLOOD LEFT ANTECUBITAL    Special Requests BOTTLES DRAWN AEROBIC AND ANAEROBIC 10CC    Culture NO GROWTH < 24 HOURS    Report Status PENDING   Culture, blood (Routine X 2) w Reflex to ID Panel     Status: None (Preliminary result)   Collection Time: 12/22/15  4:15 PM  Result Value Ref Range   Specimen Description BLOOD LEFT FOREARM    Special Requests BOTTLES DRAWN AEROBIC AND ANAEROBIC 10CC    Culture NO GROWTH < 24 HOURS    Report Status PENDING   Glucose, capillary     Status: Abnormal   Collection Time: 12/22/15  4:43 PM  Result Value Ref Range   Glucose-Capillary 174 (H) 65 - 99 mg/dL  Glucose, capillary     Status: Abnormal   Collection Time: 12/22/15  9:08 PM  Result Value Ref Range   Glucose-Capillary 177 (H) 65 - 99 mg/dL  Glucose, capillary     Status:  Abnormal   Collection Time: 12/23/15  1:23 AM  Result Value Ref Range   Glucose-Capillary 162 (H) 65 - 99 mg/dL  Vancomycin, trough     Status: None   Collection Time: 12/23/15  2:19 AM  Result Value Ref Range   Vancomycin Tr 17 15 - 20 ug/mL  CBC     Status: Abnormal   Collection Time: 12/23/15  2:19 AM  Result Value Ref Range   WBC 14.7 (H) 4.0 - 10.5 K/uL   RBC 3.14 (L) 3.87 - 5.11 MIL/uL   Hemoglobin 8.4 (L) 12.0 - 15.0 g/dL   HCT 26.2 (L) 36.0 - 46.0 %   MCV 83.4 78.0 - 100.0 fL   MCH 26.8 26.0 - 34.0 pg   MCHC 32.1 30.0 - 36.0 g/dL   RDW 19.1 (H) 11.5 - 15.5 %   Platelets 382 150 - 400 K/uL  Basic metabolic panel     Status: Abnormal   Collection Time: 12/23/15  2:19 AM  Result Value Ref Range   Sodium 137 135 - 145 mmol/L   Potassium 4.0 3.5 - 5.1 mmol/L   Chloride 107 101 - 111 mmol/L   CO2 27 22 - 32 mmol/L   Glucose, Bld 167 (H) 65 - 99 mg/dL   BUN 6 6 - 20 mg/dL   Creatinine, Ser 0.51 0.44 - 1.00 mg/dL   Calcium 9.1 8.9 - 10.3 mg/dL   GFR calc non Af Amer >60 >60 mL/min   GFR calc Af Amer >60 >60 mL/min   Anion gap 3 (L) 5 - 15  Glucose, capillary     Status: Abnormal   Collection Time: 12/23/15  5:48 AM  Result Value Ref Range   Glucose-Capillary 192 (H) 65 - 99 mg/dL  Glucose, capillary     Status: Abnormal   Collection Time: 12/23/15 12:58 PM  Result Value Ref Range   Glucose-Capillary 153 (H) 65 - 99 mg/dL   Recent Results (from the past 240 hour(s))  Blood culture (routine x 2)     Status: Abnormal   Collection Time: 12/18/15 10:35 AM  Result Value Ref Range Status   Specimen Description BLOOD LEFT HAND  Final   Special Requests BOTTLES DRAWN AEROBIC AND ANAEROBIC  5CC  Final   Culture  Setup Time   Final    GRAM POSITIVE COCCI IN CLUSTERS IN BOTH AEROBIC AND ANAEROBIC BOTTLES CRITICAL RESULT CALLED TO, READ BACK BY AND VERIFIED WITH: J MARKEL,PHARMD AT 1417 12/19/15 BY L BENFIELD    Culture STAPHYLOCOCCUS SPECIES (COAGULASE NEGATIVE) (A)  Final    Report Status 12/21/2015 FINAL  Final   Organism ID, Bacteria STAPHYLOCOCCUS SPECIES (COAGULASE NEGATIVE)  Final      Susceptibility   Staphylococcus species (coagulase negative) - MIC*    CIPROFLOXACIN 1 SENSITIVE Sensitive     ERYTHROMYCIN <=0.25 SENSITIVE Sensitive     GENTAMICIN <=0.5 SENSITIVE Sensitive     OXACILLIN 0.5 RESISTANT Resistant     TETRACYCLINE <=1 SENSITIVE Sensitive     VANCOMYCIN 1 SENSITIVE Sensitive     TRIMETH/SULFA 20 SENSITIVE Sensitive     CLINDAMYCIN <=0.25 SENSITIVE Sensitive     RIFAMPIN <=0.5 SENSITIVE Sensitive     Inducible Clindamycin NEGATIVE Sensitive     * STAPHYLOCOCCUS SPECIES (COAGULASE NEGATIVE)  Blood Culture ID Panel (Reflexed)     Status: Abnormal   Collection Time: 12/18/15 10:35 AM  Result Value Ref Range Status   Enterococcus species NOT DETECTED NOT DETECTED Final   Vancomycin resistance NOT DETECTED NOT DETECTED Final   Listeria monocytogenes NOT DETECTED NOT DETECTED Final   Staphylococcus species DETECTED (A) NOT DETECTED Final    Comment: CRITICAL RESULT CALLED TO, READ BACK BY AND VERIFIED WITH: J MARKEL,PHARMD AT 1417 12/19/15 BY L BENFIELD    Staphylococcus aureus NOT DETECTED NOT DETECTED Final   Methicillin resistance DETECTED (A) NOT DETECTED Final    Comment: CRITICAL RESULT CALLED TO, READ BACK BY AND VERIFIED WITH: J MARKEL,PHARMD AT 1417 12/19/15 BY L BENFIELD    Streptococcus species NOT DETECTED NOT DETECTED Final   Streptococcus agalactiae NOT DETECTED NOT DETECTED Final   Streptococcus pneumoniae NOT DETECTED NOT DETECTED Final   Streptococcus pyogenes NOT DETECTED NOT DETECTED Final   Acinetobacter baumannii NOT DETECTED NOT DETECTED Final   Enterobacteriaceae species NOT DETECTED NOT DETECTED Final   Enterobacter cloacae complex NOT DETECTED NOT DETECTED Final   Escherichia coli NOT DETECTED NOT DETECTED Final   Klebsiella oxytoca NOT DETECTED NOT DETECTED Final   Klebsiella pneumoniae NOT DETECTED NOT DETECTED  Final   Proteus species NOT DETECTED NOT DETECTED Final   Serratia marcescens NOT DETECTED NOT DETECTED Final   Carbapenem resistance NOT DETECTED NOT DETECTED Final   Haemophilus influenzae NOT DETECTED NOT DETECTED Final   Neisseria meningitidis NOT DETECTED NOT DETECTED Final   Pseudomonas aeruginosa NOT DETECTED NOT DETECTED Final   Candida albicans NOT DETECTED NOT DETECTED Final   Candida glabrata NOT DETECTED NOT DETECTED Final   Candida krusei NOT DETECTED NOT DETECTED Final   Candida parapsilosis NOT DETECTED NOT DETECTED Final   Candida tropicalis NOT DETECTED NOT DETECTED Final  Urine culture     Status:  Abnormal   Collection Time: 12/18/15 11:18 AM  Result Value Ref Range Status   Specimen Description URINE, CATHETERIZED  Final   Special Requests Normal  Final   Culture >=100,000 COLONIES/mL PROVIDENCIA STUARTII (A)  Final   Report Status 12/20/2015 FINAL  Final   Organism ID, Bacteria PROVIDENCIA STUARTII (A)  Final      Susceptibility   Providencia stuartii - MIC*    AMPICILLIN 16 RESISTANT Resistant     CEFAZOLIN >=64 RESISTANT Resistant     CEFTRIAXONE <=1 SENSITIVE Sensitive     CIPROFLOXACIN >=4 RESISTANT Resistant     GENTAMICIN >=16 RESISTANT Resistant     IMIPENEM 2 SENSITIVE Sensitive     NITROFURANTOIN 128 RESISTANT Resistant     TRIMETH/SULFA 40 SENSITIVE Sensitive     AMPICILLIN/SULBACTAM 8 SENSITIVE Sensitive     PIP/TAZO <=4 SENSITIVE Sensitive     * >=100,000 COLONIES/mL PROVIDENCIA STUARTII  Blood culture (routine x 2)     Status: Abnormal   Collection Time: 12/18/15 12:00 PM  Result Value Ref Range Status   Specimen Description BLOOD RIGHT HAND  Final   Special Requests IN PEDIATRIC BOTTLE  3CC  Final   Culture  Setup Time   Final    GRAM POSITIVE COCCI IN CLUSTERS AEROBIC BOTTLE ONLY CRITICAL RESULT CALLED TO, READ BACK BY AND VERIFIED WITH: J MARKEL,PHARMD AT 1417 12/19/15 BY L BENFIELD    Culture STAPHYLOCOCCUS SPECIES (COAGULASE NEGATIVE) (A)   Final   Report Status 12/23/2015 FINAL  Final   Organism ID, Bacteria STAPHYLOCOCCUS SPECIES (COAGULASE NEGATIVE)  Final      Susceptibility   Staphylococcus species (coagulase negative) - MIC*    CIPROFLOXACIN <=0.5 SENSITIVE Sensitive     ERYTHROMYCIN <=0.25 SENSITIVE Sensitive     GENTAMICIN <=0.5 SENSITIVE Sensitive     OXACILLIN <=0.25 SENSITIVE Sensitive     TETRACYCLINE <=1 SENSITIVE Sensitive     VANCOMYCIN <=0.5 SENSITIVE Sensitive     TRIMETH/SULFA 20 SENSITIVE Sensitive     CLINDAMYCIN <=0.25 SENSITIVE Sensitive     RIFAMPIN <=0.5 SENSITIVE Sensitive     Inducible Clindamycin NEGATIVE Sensitive     * STAPHYLOCOCCUS SPECIES (COAGULASE NEGATIVE)  MRSA PCR Screening     Status: None   Collection Time: 12/18/15  3:00 PM  Result Value Ref Range Status   MRSA by PCR NEGATIVE NEGATIVE Final    Comment:        The GeneXpert MRSA Assay (FDA approved for NASAL specimens only), is one component of a comprehensive MRSA colonization surveillance program. It is not intended to diagnose MRSA infection nor to guide or monitor treatment for MRSA infections.   Culture, blood (routine x 2)     Status: Abnormal   Collection Time: 12/18/15  7:57 PM  Result Value Ref Range Status   Specimen Description BLOOD LEFT ANTECUBITAL  Final   Special Requests IN PEDIATRIC BOTTLE  3CC  Final   Culture  Setup Time   Final    GRAM NEGATIVE RODS AEROBIC BOTTLE ONLY CRITICAL RESULT CALLED TO, READ BACK BY AND VERIFIED WITH: J MARKEL 12/19/15 @ 1553 M VESTAL    Culture PROVIDENCIA STUARTII (A)  Final   Report Status 12/21/2015 FINAL  Final   Organism ID, Bacteria PROVIDENCIA STUARTII  Final      Susceptibility   Providencia stuartii - MIC*    AMPICILLIN 16 RESISTANT Resistant     CEFAZOLIN >=64 RESISTANT Resistant     CEFEPIME <=1 SENSITIVE Sensitive  CEFTAZIDIME <=1 SENSITIVE Sensitive     CEFTRIAXONE <=1 SENSITIVE Sensitive     CIPROFLOXACIN >=4 RESISTANT Resistant     GENTAMICIN 8  RESISTANT Resistant     IMIPENEM 1 SENSITIVE Sensitive     TRIMETH/SULFA <=20 SENSITIVE Sensitive     AMPICILLIN/SULBACTAM 4 SENSITIVE Sensitive     PIP/TAZO <=4 SENSITIVE Sensitive     * PROVIDENCIA STUARTII  Blood Culture ID Panel (Reflexed)     Status: None   Collection Time: 12/18/15  7:57 PM  Result Value Ref Range Status   Enterococcus species NOT DETECTED NOT DETECTED Final   Vancomycin resistance NOT DETECTED NOT DETECTED Final   Listeria monocytogenes NOT DETECTED NOT DETECTED Final   Staphylococcus species NOT DETECTED NOT DETECTED Final   Staphylococcus aureus NOT DETECTED NOT DETECTED Final   Methicillin resistance NOT DETECTED NOT DETECTED Final   Streptococcus species NOT DETECTED NOT DETECTED Final   Streptococcus agalactiae NOT DETECTED NOT DETECTED Final   Streptococcus pneumoniae NOT DETECTED NOT DETECTED Final   Streptococcus pyogenes NOT DETECTED NOT DETECTED Final   Acinetobacter baumannii NOT DETECTED NOT DETECTED Final   Enterobacteriaceae species NOT DETECTED NOT DETECTED Final   Enterobacter cloacae complex NOT DETECTED NOT DETECTED Final   Escherichia coli NOT DETECTED NOT DETECTED Final   Klebsiella oxytoca NOT DETECTED NOT DETECTED Final   Klebsiella pneumoniae NOT DETECTED NOT DETECTED Final   Proteus species NOT DETECTED NOT DETECTED Final   Serratia marcescens NOT DETECTED NOT DETECTED Final   Carbapenem resistance NOT DETECTED NOT DETECTED Final   Haemophilus influenzae NOT DETECTED NOT DETECTED Final   Neisseria meningitidis NOT DETECTED NOT DETECTED Final   Pseudomonas aeruginosa NOT DETECTED NOT DETECTED Final   Candida albicans NOT DETECTED NOT DETECTED Final   Candida glabrata NOT DETECTED NOT DETECTED Final   Candida krusei NOT DETECTED NOT DETECTED Final   Candida parapsilosis NOT DETECTED NOT DETECTED Final   Candida tropicalis NOT DETECTED NOT DETECTED Final  Culture, blood (routine x 2)     Status: Abnormal   Collection Time: 12/18/15   8:21 PM  Result Value Ref Range Status   Specimen Description BLOOD RIGHT ANTECUBITAL  Final   Special Requests IN PEDIATRIC BOTTLE  1CC  Final   Culture  Setup Time   Final    GRAM POSITIVE COCCI IN CLUSTERS AEROBIC BOTTLE ONLY CRITICAL RESULT CALLED TO, READ BACK BY AND VERIFIED WITH: J MARKEL 12/19/15 @ 1607 M VESTAL    Culture STAPHYLOCOCCUS SPECIES (COAGULASE NEGATIVE) (A)  Final   Report Status 12/22/2015 FINAL  Final   Organism ID, Bacteria STAPHYLOCOCCUS SPECIES (COAGULASE NEGATIVE)  Final      Susceptibility   Staphylococcus species (coagulase negative) - MIC*    CIPROFLOXACIN >=8 RESISTANT Resistant     ERYTHROMYCIN 0.5 SENSITIVE Sensitive     GENTAMICIN <=0.5 SENSITIVE Sensitive     OXACILLIN >=4 RESISTANT Resistant     TETRACYCLINE <=1 SENSITIVE Sensitive     VANCOMYCIN 1 SENSITIVE Sensitive     TRIMETH/SULFA 80 RESISTANT Resistant     CLINDAMYCIN <=0.25 SENSITIVE Sensitive     RIFAMPIN <=0.5 SENSITIVE Sensitive     Inducible Clindamycin NEGATIVE Sensitive     * STAPHYLOCOCCUS SPECIES (COAGULASE NEGATIVE)  Culture, blood (Routine X 2) w Reflex to ID Panel     Status: None (Preliminary result)   Collection Time: 12/22/15  4:00 PM  Result Value Ref Range Status   Specimen Description BLOOD LEFT ANTECUBITAL  Final   Special Requests  BOTTLES DRAWN AEROBIC AND ANAEROBIC 10CC  Final   Culture NO GROWTH < 24 HOURS  Final   Report Status PENDING  Incomplete  Culture, blood (Routine X 2) w Reflex to ID Panel     Status: None (Preliminary result)   Collection Time: 12/22/15  4:15 PM  Result Value Ref Range Status   Specimen Description BLOOD LEFT FOREARM  Final   Special Requests BOTTLES DRAWN AEROBIC AND ANAEROBIC 10CC  Final   Culture NO GROWTH < 24 HOURS  Final   Report Status PENDING  Incomplete    Renal Function:  Recent Labs  12/18/15 1035 12/19/15 0744 12/20/15 0646 12/21/15 0559 12/22/15 0735 12/23/15 0219  CREATININE 3.32* 1.48* 0.84 0.67 0.59 0.51    Estimated Creatinine Clearance: 69.7 mL/min (by C-G formula based on Cr of 0.51).  Radiologic Imaging: Ct Abdomen Pelvis W Contrast  12/22/2015  CLINICAL DATA:  55 year old female admitted 12/18/2015 with urinary tract infection and altered mental status. Acute renal failure. Prior ultrasound exam 12/18/2015 demonstrates evidence of pyelonephritis, left nephrolithiasis, and hydronephrosis. EXAM: CT ABDOMEN AND PELVIS WITH CONTRAST TECHNIQUE: Multidetector CT imaging of the abdomen and pelvis was performed using the standard protocol following bolus administration of intravenous contrast. CONTRAST:  134mL ISOVUE-300 IOPAMIDOL (ISOVUE-300) INJECTION 61% COMPARISON:  Ultrasound 12/18/2015, CT 12/15/2014 FINDINGS: Lower chest: Unremarkable appearance of the soft tissues of the chest wall. Heart size within normal limits.  No pericardial fluid/thickening. No lower mediastinal adenopathy. Unremarkable appearance of the distal esophagus. Small hiatal hernia Dependent atelectasis with trace bilateral pleural effusions. Abdomen/pelvis: Unremarkable appearance of liver and spleen. Unremarkable bilateral adrenal glands. Unremarkable pancreas. Unremarkable gallbladder. Distended stomach, duodenum, and jejunum, with multiple air-fluid levels and dilated bowel loops. There is decompression of distal ileum in the right lower quadrant, with a transition on image 53. Mild to moderate stool burden.  Colonic diverticula. Within the low abdomen/pelvis there are ill-defined inflammatory changes with contracted appearance of the local anatomy including sigmoid colon, dome of the urinary bladder, adjacent small bowel loops, mesenteric, and the abdominal wall musculature (image 83). Small amount of free-fluid, with obscuration of the fat planes. Trace fluid throughout the abdomen.  No free air identified. Configuration the right kidney collecting system unchanged from the comparison CT, with extrarenal pelvis and mild dilation the  proximal ureter. Unremarkable course of the right ureter with no stone identified. Similar appearance of left-sided nephrolithiasis with unchanged appearance of the dilated collecting system. The stones at the left ureteropelvic junction have increased in size. Unchanged position of distal left ureteral stone, just above the ureterovesical junction. This stone is slightly increased in size now measuring 11 mm. Significant inflammatory changes associated with the left collecting system, with thickening and enhancement of the proximal left ureter and ureterovesical junction. Symmetric perfusion of the kidneys maintained. Urinary bladder is decompressed with urinary catheter in situ. Gas within the dome of the urinary bladder, nonspecific. The dome of the bladder is indistinguishable from inflammatory changes in the low abdomen/ pelvis, described above. Multiple retroperitoneal lymph nodes. Fibroid changes of the uterus. Scattered calcifications of the abdominal aorta. No dissection or aneurysm. Atherosclerotic changes at the origin of the mesenteric vessels, with no significant stenosis or occlusion identified. Musculoskeletal: Degenerative changes of the spine. No significant bony canal narrowing. No displaced fracture. IMPRESSION: Evidence of developing bowel obstruction or ileus, with transition point in the distal ileum. Etiology may be secondary to adhesion of the right lower quadrant, or could potentially be reactive given the significant inflammatory changes  involving the urinary system and the urinary bladder. Ill-defined inflammatory/ infectious changes of the low abdomen/ pelvis, where several viscera are involved. Inflammatory changes involve the dome of the urinary bladder, distal small bowel loops, and the sigmoid colon, with a source involving any of these. Favored etiology is secondary to chronic urinary tract infection given the imaging appearance over time and left-sided obstructive nephrolithiasis,  however, diverticulitis not excluded. Persistent at least partially obstructive left-sided nephrolithiasis, with stones at the ureteropelvic junction and in the distal ureter just above the left ureterovesical junction. Inflammatory changes at the hilum of the left kidney have worsened since the comparison CT, compatible with pyelonephritis/urinary tract infection. Associated lymph nodes of the retroperitoneum. Aortic atherosclerosis. Trace bilateral pleural effusions. Signed, Dulcy Fanny. Earleen Newport, DO [facility] Electronically Signed   By: Corrie Mckusick D.O.   On: 12/22/2015 07:45    I independently reviewed the above imaging studies.  Impression/Recommendation: 55 year old female with complicated medical history including MS, DM2 and recurrent UTIs who presents with sepsis secondary to UTI complicated by large left renal pelvis stones measuring about 3cm total in addition to ~1.5cm of distal left ureteral stone causing mild left hydronephrosis. She would benefit from intervention to drain her left kidney, ideally with left nephrostomy tube. However, given that she is on Xarelto, will proceed with left ureteral stent placement.  - Please hold Xarelto and consult IR for left percutaneous nephrostomy tube placement. This is not emergent and can be delayed while Xarelto clears from her system but should be performed within the new few days - Continue IV antibiotics per ID for sepsis - Ultimately she will need definitive treatment of her stone burden likely with left percutaneous nephrolithotomy once her sepsis has adequately been treated  Lolita Rieger 12/23/2015, 3:46 PM   I have reviewed the patient's history as above. I have reviewed her images as well as relevant laboratory findings. I have spoken with the patient about her urolithiasis, infection, and eventual management of her stones and obstruction. I agree with the above plan for percutaneous  nephrostomy tube, and she will eventually need percutaneous management of her stones.

## 2015-12-23 NOTE — Progress Notes (Signed)
Pharmacy Antibiotic Note  [name] is a 55 y.o. female admitted on 12/18/2015 with sepsis.  Pharmacy has been consulted for vancomycin and ceftriaxone dosing.  Day #6 of abx for urosepsis / bacteremia. Afebrile (98.7), WBC elevated and trending down 17.4>14.7. Continuing vancomycin and ceftriaxone.  7/12 Vancomycin trough = 17 ug/mL and is within goal range of 15-20 ug/mL. Trough drawn appropriately at [facility] and before dose administration. Renal function continues to improve. New cultures drawn 7/11 and are pending. ID physicians are also following.   Plan: Ceftriaxone 2g IV Q24 Continue vancomycin 750mg  IV Q12 Monitor clinical picture, renal function, VT prn F/U C&S, LOT F/U ID recommendations   Height: 5\' 3"  (160 cm) Weight: 140 lb 3.4 oz (63.6 kg) IBW/kg (Calculated) : 52.4  Temp (24hrs), Avg:99.2 F (37.3 C), Min:98.7 F (37.1 C), Max:99.6 F (37.6 C)   Recent Labs Lab 12/18/15 1049 12/18/15 2014 12/19/15 0008 12/19/15 0744 12/19/15 1607 12/19/15 1812 12/20/15 0646 12/21/15 0559 12/22/15 0735 12/23/15 0219  WBC  --   --   --  14.8*  --   --  17.4* 15.9* 14.8* 14.7*  CREATININE  --   --   --  1.48*  --   --  0.84 0.67 0.59 0.51  LATICACIDVEN 2.46* 1.1 2.4*  --  2.1* 2.1*  --   --   --   --   VANCOTROUGH  --   --   --   --   --   --   --   --   --  17    Estimated Creatinine Clearance: 69.7 mL/min (by C-G formula based on Cr of 0.51).    No Known Allergies  Antimicrobials this admission: Zosyn  7/7 >> 7/11 Vancomycin 7/7 >>  Ceftriaxone 7/11 >>   Dose adjustments this admission: 7/12 VT = 17 ng/mL on 750mg  Q12 - no dose adjustment   Microbiology results: 7/7 BCx x2 1035: methicillin resistant CoNS (both bottles) 7/7 BCx x2 1200: GPC clusters 1/2 (final cx pending) 7/7 BCx x2 1957: Providencia stuartii 1/2 (S-Un/Zosyn, CTX, Imi, SMX/TMP) 7/7 BCx x2 2021: Coag negative staph 1/2 (pan-sensitive except R-Cipro) 7/7 UCx: > 100k of Providencia stuartii  (S-Unasyn, CTX, imi, Zosyn, Bactrim)  7/11 BCx pending   Thank you for allowing pharmacy to be a part of this patient's care.  Demetrius Charity, PharmD PGY1 Pharmacy Resident  12/23/2015 10:37 AM

## 2015-12-23 NOTE — Progress Notes (Signed)
Patient ID: [name], female   DOB: 01-22-1961, 55 y.o.   MRN: [id number]   LOS: 5 days   Subjective: Feeling better this morning. No N/V but flatus and lots of burping. Had 2 normal BM's last night. Denies N/V.   Objective: Vital signs in last 24 hours: Temp:  [98.7 F (37.1 C)-99.6 F (37.6 C)] 98.7 F (37.1 C) (07/12 0548) Pulse Rate:  [89-98] 98 (07/12 0548) Resp:  [16] 16 (07/12 0548) BP: (130-132)/(72-74) 132/74 mmHg (07/12 0548) SpO2:  [97 %] 97 % (07/12 0548) Last BM Date: 12/22/15   Laboratory  CBC  Recent Labs  12/22/15 0735 12/23/15 0219  WBC 14.8* 14.7*  HGB 8.1* 8.4*  HCT 24.6* 26.2*  PLT 353 382   BMET  Recent Labs  12/22/15 0735 12/23/15 0219  NA 139 137  K 4.3 4.0  CL 108 107  CO2 26 27  GLUCOSE 152* 167*  BUN 10 6  CREATININE 0.59 0.51  CALCIUM 9.0 9.1    Physical Exam General appearance: alert and no distress Resp: clear to auscultation bilaterally Cardio: regular rate and rhythm GI: Soft, mild-to-mod LUQ/LLQ TTP, diminished BS   Assessment/Plan: Ileus -- Improving, will give clears and assess for tolerance. Sepsis w/pyelonephritis, nephrolithiasis -- Recommend urology consult   Lisette Abu, PA-C Pager: [phone] 12/23/2015

## 2015-12-23 NOTE — Progress Notes (Signed)
[name] for Infectious Disease    Date of Admission:  12/18/2015   Total of 5 days on antibiotics: Vancomycin and ceftriaxone        4 days of zosyn (7/8-7/11)  Principal Problem:   Bacteremia due to coagulase-negative Staphylococcus Active Problems:   Catheter-associated urinary tract infection (HCC)   Gram-negative bacteremia (HCC)   Left nephrolithiasis   SIRS (systemic inflammatory response syndrome) ([place])   Palliative care encounter   . baclofen  10 mg Oral TID  . cefTRIAXone (ROCEPHIN)  IV  2 g Intravenous Q24H  . darifenacin  7.5 mg Oral Daily  . famotidine  20 mg Oral Daily  . feeding supplement (ENSURE ENLIVE)  237 mL Oral BID BM  . gabapentin  100 mg Oral QHS  . insulin aspart  0-9 Units Subcutaneous Q4H  . multivitamin with minerals  1 tablet Oral Daily  . polyethylene glycol  17 g Oral Daily  . pravastatin  20 mg Oral q1800  . rivaroxaban  20 mg Oral Q supper  . senna-docusate  1 tablet Oral BID  . vancomycin  750 mg Intravenous Q12H  . vitamin C  500 mg Oral Daily    SUBJECTIVE: [name] has no more fever or episodes of confusion. She has passed several bowel movements but does continue having mild diffuse abdominal pain.  Review of Systems: Review of Systems  Constitutional: Negative for fever and chills.  Respiratory: Negative for shortness of breath.   Gastrointestinal: Positive for abdominal pain. Negative for nausea and blood in stool.  Genitourinary: Positive for flank pain.  Skin: Negative for rash.  Neurological: Positive for weakness.  Psychiatric/Behavioral: The patient is not nervous/anxious.     Past Medical History  Diagnosis Date  . Diabetes mellitus without complication ([place])   . Multiple sclerosis diagnosed 2002-not on Therapy any longer 06/26/2012  . Stage 4 decubitus ulcer ([place]) 07/05/2015  . Acute renal failure superimposed on stage  3 chronic kidney disease ([place]) 07/21/2015  . Malnutrition of moderate degree 07/21/2015  . UTI (lower urinary tract infection)   . DVT (deep venous thrombosis) ([place])   . Stroke [facility])     Social History  Substance Use Topics  . Smoking status: Former Smoker -- 1.00 packs/day for 20 years    Types: Cigarettes    Quit date: 11/19/2014  . Smokeless tobacco: Never Used  . Alcohol Use: 3.0 oz/week    5 Glasses of wine per week    Family History  Problem Relation Age of Onset  . Diabetes Mother   . Diabetes Father   . Diabetes Sister   . Scoliosis Sister   . Alzheimer's disease Mother    No Known Allergies  OBJECTIVE: Filed Vitals:   12/21/15 2222 12/22/15 0507 12/22/15 2112 12/23/15 0548  BP: 148/73 152/72 130/72 132/74  Pulse: 87 81 89 98  Temp: 98.7 F (37.1 C) 99.7 F (37.6 C) 99.6 F (37.6 C) 98.7 F (37.1 C)  TempSrc:    Oral  Resp: 16 16 16 16   Height:      Weight:      SpO2: 100% 94% 97% 97%   Body mass index is 24.84 kg/(m^2).  Physical Exam GENERAL- chronically ill appearing, no acute distress CARDIAC- Systolic murmur over left upper sternal border RESP- CTAB, no wheezes or crackles. ABDOMEN- Distended, diffusely tender to palpation but worse over left upper quadrant, bowel sounds present throughout EXTREMITIES- Thick keratotic skin changes over dorsum  of feet bilaterally, no heel or foot ulcers noted PSYCH- Flat affect    Lab Results Lab Results  Component Value Date   WBC 14.7* 12/23/2015   HGB 8.4* 12/23/2015   HCT 26.2* 12/23/2015   MCV 83.4 12/23/2015   PLT 382 12/23/2015    Lab Results  Component Value Date   CREATININE 0.51 12/23/2015   BUN 6 12/23/2015   NA 137 12/23/2015   K 4.0 12/23/2015   CL 107 12/23/2015   CO2 27 12/23/2015    Lab Results  Component Value Date   ALT 7* 12/19/2015   AST 12* 12/19/2015   ALKPHOS 50 12/19/2015   BILITOT 0.5 12/19/2015     Microbiology: Recent Results (from the past 240 hour(s))  Blood  culture (routine x 2)     Status: Abnormal   Collection Time: 12/18/15 10:35 AM  Result Value Ref Range Status   Specimen Description BLOOD LEFT HAND  Final   Special Requests BOTTLES DRAWN AEROBIC AND ANAEROBIC  5CC  Final   Culture  Setup Time   Final    GRAM POSITIVE COCCI IN CLUSTERS IN BOTH AEROBIC AND ANAEROBIC BOTTLES CRITICAL RESULT CALLED TO, READ BACK BY AND VERIFIED WITH: J MARKEL,PHARMD AT 1417 12/19/15 BY L BENFIELD    Culture STAPHYLOCOCCUS SPECIES (COAGULASE NEGATIVE) (A)  Final   Report Status 12/21/2015 FINAL  Final   Organism ID, Bacteria STAPHYLOCOCCUS SPECIES (COAGULASE NEGATIVE)  Final      Susceptibility   Staphylococcus species (coagulase negative) - MIC*    CIPROFLOXACIN 1 SENSITIVE Sensitive     ERYTHROMYCIN <=0.25 SENSITIVE Sensitive     GENTAMICIN <=0.5 SENSITIVE Sensitive     OXACILLIN 0.5 RESISTANT Resistant     TETRACYCLINE <=1 SENSITIVE Sensitive     VANCOMYCIN 1 SENSITIVE Sensitive     TRIMETH/SULFA 20 SENSITIVE Sensitive     CLINDAMYCIN <=0.25 SENSITIVE Sensitive     RIFAMPIN <=0.5 SENSITIVE Sensitive     Inducible Clindamycin NEGATIVE Sensitive     * STAPHYLOCOCCUS SPECIES (COAGULASE NEGATIVE)  Blood Culture ID Panel (Reflexed)     Status: Abnormal   Collection Time: 12/18/15 10:35 AM  Result Value Ref Range Status   Enterococcus species NOT DETECTED NOT DETECTED Final   Vancomycin resistance NOT DETECTED NOT DETECTED Final   Listeria monocytogenes NOT DETECTED NOT DETECTED Final   Staphylococcus species DETECTED (A) NOT DETECTED Final    Comment: CRITICAL RESULT CALLED TO, READ BACK BY AND VERIFIED WITH: J MARKEL,PHARMD AT 1417 12/19/15 BY L BENFIELD    Staphylococcus aureus NOT DETECTED NOT DETECTED Final   Methicillin resistance DETECTED (A) NOT DETECTED Final    Comment: CRITICAL RESULT CALLED TO, READ BACK BY AND VERIFIED WITH: J MARKEL,PHARMD AT 1417 12/19/15 BY L BENFIELD    Streptococcus species NOT DETECTED NOT DETECTED Final    Streptococcus agalactiae NOT DETECTED NOT DETECTED Final   Streptococcus pneumoniae NOT DETECTED NOT DETECTED Final   Streptococcus pyogenes NOT DETECTED NOT DETECTED Final   Acinetobacter baumannii NOT DETECTED NOT DETECTED Final   Enterobacteriaceae species NOT DETECTED NOT DETECTED Final   Enterobacter cloacae complex NOT DETECTED NOT DETECTED Final   Escherichia coli NOT DETECTED NOT DETECTED Final   Klebsiella oxytoca NOT DETECTED NOT DETECTED Final   Klebsiella pneumoniae NOT DETECTED NOT DETECTED Final   Proteus species NOT DETECTED NOT DETECTED Final   Serratia marcescens NOT DETECTED NOT DETECTED Final   Carbapenem resistance NOT DETECTED NOT DETECTED Final   Haemophilus influenzae NOT DETECTED NOT  DETECTED Final   Neisseria meningitidis NOT DETECTED NOT DETECTED Final   Pseudomonas aeruginosa NOT DETECTED NOT DETECTED Final   Candida albicans NOT DETECTED NOT DETECTED Final   Candida glabrata NOT DETECTED NOT DETECTED Final   Candida krusei NOT DETECTED NOT DETECTED Final   Candida parapsilosis NOT DETECTED NOT DETECTED Final   Candida tropicalis NOT DETECTED NOT DETECTED Final  Urine culture     Status: Abnormal   Collection Time: 12/18/15 11:18 AM  Result Value Ref Range Status   Specimen Description URINE, CATHETERIZED  Final   Special Requests Normal  Final   Culture >=100,000 COLONIES/mL PROVIDENCIA STUARTII (A)  Final   Report Status 12/20/2015 FINAL  Final   Organism ID, Bacteria PROVIDENCIA STUARTII (A)  Final      Susceptibility   Providencia stuartii - MIC*    AMPICILLIN 16 RESISTANT Resistant     CEFAZOLIN >=64 RESISTANT Resistant     CEFTRIAXONE <=1 SENSITIVE Sensitive     CIPROFLOXACIN >=4 RESISTANT Resistant     GENTAMICIN >=16 RESISTANT Resistant     IMIPENEM 2 SENSITIVE Sensitive     NITROFURANTOIN 128 RESISTANT Resistant     TRIMETH/SULFA 40 SENSITIVE Sensitive     AMPICILLIN/SULBACTAM 8 SENSITIVE Sensitive     PIP/TAZO <=4 SENSITIVE Sensitive     *  >=100,000 COLONIES/mL PROVIDENCIA STUARTII  Blood culture (routine x 2)     Status: Abnormal   Collection Time: 12/18/15 12:00 PM  Result Value Ref Range Status   Specimen Description BLOOD RIGHT HAND  Final   Special Requests IN PEDIATRIC BOTTLE  3CC  Final   Culture  Setup Time   Final    GRAM POSITIVE COCCI IN CLUSTERS AEROBIC BOTTLE ONLY CRITICAL RESULT CALLED TO, READ BACK BY AND VERIFIED WITH: J MARKEL,PHARMD AT 1417 12/19/15 BY L BENFIELD    Culture STAPHYLOCOCCUS SPECIES (COAGULASE NEGATIVE) (A)  Final   Report Status 12/23/2015 FINAL  Final   Organism ID, Bacteria STAPHYLOCOCCUS SPECIES (COAGULASE NEGATIVE)  Final      Susceptibility   Staphylococcus species (coagulase negative) - MIC*    CIPROFLOXACIN <=0.5 SENSITIVE Sensitive     ERYTHROMYCIN <=0.25 SENSITIVE Sensitive     GENTAMICIN <=0.5 SENSITIVE Sensitive     OXACILLIN <=0.25 SENSITIVE Sensitive     TETRACYCLINE <=1 SENSITIVE Sensitive     VANCOMYCIN <=0.5 SENSITIVE Sensitive     TRIMETH/SULFA 20 SENSITIVE Sensitive     CLINDAMYCIN <=0.25 SENSITIVE Sensitive     RIFAMPIN <=0.5 SENSITIVE Sensitive     Inducible Clindamycin NEGATIVE Sensitive     * STAPHYLOCOCCUS SPECIES (COAGULASE NEGATIVE)  MRSA PCR Screening     Status: None   Collection Time: 12/18/15  3:00 PM  Result Value Ref Range Status   MRSA by PCR NEGATIVE NEGATIVE Final    Comment:        The GeneXpert MRSA Assay (FDA approved for NASAL specimens only), is one component of a comprehensive MRSA colonization surveillance program. It is not intended to diagnose MRSA infection nor to guide or monitor treatment for MRSA infections.   Culture, blood (routine x 2)     Status: Abnormal   Collection Time: 12/18/15  7:57 PM  Result Value Ref Range Status   Specimen Description BLOOD LEFT ANTECUBITAL  Final   Special Requests IN PEDIATRIC BOTTLE  3CC  Final   Culture  Setup Time   Final    GRAM NEGATIVE RODS AEROBIC BOTTLE ONLY CRITICAL RESULT CALLED TO,  READ BACK BY AND  VERIFIED WITH: Scherrie Gerlach 12/19/15 @ 1553 M VESTAL    Culture PROVIDENCIA STUARTII (A)  Final   Report Status 12/21/2015 FINAL  Final   Organism ID, Bacteria PROVIDENCIA STUARTII  Final      Susceptibility   Providencia stuartii - MIC*    AMPICILLIN 16 RESISTANT Resistant     CEFAZOLIN >=64 RESISTANT Resistant     CEFEPIME <=1 SENSITIVE Sensitive     CEFTAZIDIME <=1 SENSITIVE Sensitive     CEFTRIAXONE <=1 SENSITIVE Sensitive     CIPROFLOXACIN >=4 RESISTANT Resistant     GENTAMICIN 8 RESISTANT Resistant     IMIPENEM 1 SENSITIVE Sensitive     TRIMETH/SULFA <=20 SENSITIVE Sensitive     AMPICILLIN/SULBACTAM 4 SENSITIVE Sensitive     PIP/TAZO <=4 SENSITIVE Sensitive     * PROVIDENCIA STUARTII  Blood Culture ID Panel (Reflexed)     Status: None   Collection Time: 12/18/15  7:57 PM  Result Value Ref Range Status   Enterococcus species NOT DETECTED NOT DETECTED Final   Vancomycin resistance NOT DETECTED NOT DETECTED Final   Listeria monocytogenes NOT DETECTED NOT DETECTED Final   Staphylococcus species NOT DETECTED NOT DETECTED Final   Staphylococcus aureus NOT DETECTED NOT DETECTED Final   Methicillin resistance NOT DETECTED NOT DETECTED Final   Streptococcus species NOT DETECTED NOT DETECTED Final   Streptococcus agalactiae NOT DETECTED NOT DETECTED Final   Streptococcus pneumoniae NOT DETECTED NOT DETECTED Final   Streptococcus pyogenes NOT DETECTED NOT DETECTED Final   Acinetobacter baumannii NOT DETECTED NOT DETECTED Final   Enterobacteriaceae species NOT DETECTED NOT DETECTED Final   Enterobacter cloacae complex NOT DETECTED NOT DETECTED Final   Escherichia coli NOT DETECTED NOT DETECTED Final   Klebsiella oxytoca NOT DETECTED NOT DETECTED Final   Klebsiella pneumoniae NOT DETECTED NOT DETECTED Final   Proteus species NOT DETECTED NOT DETECTED Final   Serratia marcescens NOT DETECTED NOT DETECTED Final   Carbapenem resistance NOT DETECTED NOT DETECTED Final    Haemophilus influenzae NOT DETECTED NOT DETECTED Final   Neisseria meningitidis NOT DETECTED NOT DETECTED Final   Pseudomonas aeruginosa NOT DETECTED NOT DETECTED Final   Candida albicans NOT DETECTED NOT DETECTED Final   Candida glabrata NOT DETECTED NOT DETECTED Final   Candida krusei NOT DETECTED NOT DETECTED Final   Candida parapsilosis NOT DETECTED NOT DETECTED Final   Candida tropicalis NOT DETECTED NOT DETECTED Final  Culture, blood (routine x 2)     Status: Abnormal   Collection Time: 12/18/15  8:21 PM  Result Value Ref Range Status   Specimen Description BLOOD RIGHT ANTECUBITAL  Final   Special Requests IN PEDIATRIC BOTTLE  1CC  Final   Culture  Setup Time   Final    GRAM POSITIVE COCCI IN CLUSTERS AEROBIC BOTTLE ONLY CRITICAL RESULT CALLED TO, READ BACK BY AND VERIFIED WITH: J MARKEL 12/19/15 @ 1607 M VESTAL    Culture STAPHYLOCOCCUS SPECIES (COAGULASE NEGATIVE) (A)  Final   Report Status 12/22/2015 FINAL  Final   Organism ID, Bacteria STAPHYLOCOCCUS SPECIES (COAGULASE NEGATIVE)  Final      Susceptibility   Staphylococcus species (coagulase negative) - MIC*    CIPROFLOXACIN >=8 RESISTANT Resistant     ERYTHROMYCIN 0.5 SENSITIVE Sensitive     GENTAMICIN <=0.5 SENSITIVE Sensitive     OXACILLIN >=4 RESISTANT Resistant     TETRACYCLINE <=1 SENSITIVE Sensitive     VANCOMYCIN 1 SENSITIVE Sensitive     TRIMETH/SULFA 80 RESISTANT Resistant     CLINDAMYCIN <=0.25  SENSITIVE Sensitive     RIFAMPIN <=0.5 SENSITIVE Sensitive     Inducible Clindamycin NEGATIVE Sensitive     * STAPHYLOCOCCUS SPECIES (COAGULASE NEGATIVE)     ASSESSMENT: Bacteremia with mixed flora: [name] appears to be responding well to her antibiotics with resolution of system symptoms of fever or enceophalopathy. Repeat blood cultures show no growth at one day. Her recent redevelopment of a severe infection within 5 weeks of last discharge on antibiotics may require a more extensive than usual treatment plan. She  may need continued parenteral antibiotics after discharge.  Complicated UTI with left nephrolithiasis: Recurrent Providencia UTI in the setting of chronic foley catheterization and chornic inflammatory changes are present on abdominal CT. Her left nephrolithiasis is chronic but appears to be larger or more obstructive at this time. Several bacteria species are associated with increased stone formation, among them providencia and staph species. These stones may also be partially obstructive which could lead to chronic infection of her upper urinary tract. Urology assessment of her complicated anatomy will be helpful in deciding a course of treatment.  PLAN: 1. Continue vancomycin and ceftriaxone 2. Recommend urologic evaluation   Collier Salina, MD PGY-II Internal Medicine Resident Pager# [phone] 12/23/2015, 1:26 PM

## 2015-12-23 NOTE — Progress Notes (Signed)
   12/23/15 1000  Clinical Encounter Type  Visited With Patient  Visit Type Follow-up  Consult/Referral To Chaplain  Stress Factors  Patient Stress Factors Exhausted   Chaplain visited with patient.  Patient very exhausted. Patient still wants cousin th handle AD paperwork with her.  Maudry Diego is out of town in the [place] for a few days.  Patient wanted something to drink. With consent of nurse, Chaplain offered ministry of hospitality.  Brought patient a cold sprite to drink.  Vilinda Blanks [facility] 12/23/2015 10:36 AM

## 2015-12-24 DIAGNOSIS — K56600 Partial intestinal obstruction, unspecified as to cause: Secondary | ICD-10-CM

## 2015-12-24 LAB — GLUCOSE, CAPILLARY
Glucose-Capillary: 136 mg/dL — ABNORMAL HIGH (ref 65–99)
Glucose-Capillary: 146 mg/dL — ABNORMAL HIGH (ref 65–99)
Glucose-Capillary: 155 mg/dL — ABNORMAL HIGH (ref 65–99)
Glucose-Capillary: 158 mg/dL — ABNORMAL HIGH (ref 65–99)
Glucose-Capillary: 187 mg/dL — ABNORMAL HIGH (ref 65–99)
Glucose-Capillary: 211 mg/dL — ABNORMAL HIGH (ref 65–99)
Glucose-Capillary: 211 mg/dL — ABNORMAL HIGH (ref 65–99)

## 2015-12-24 NOTE — Progress Notes (Signed)
   12/24/15 1500  Clinical Encounter Type  Visited With Patient  Visit Type Follow-up  Consult/Referral To Chaplain  Spiritual Encounters  Spiritual Needs Emotional  Stress Factors  Patient Stress Factors Exhausted  Family Stress Factors None identified   Chaplain was able to visit with the patient after family left. Patient had hair, makeup and jewelry done. She felt pretty and like she wanted to go home. She enjoyed her visit with family as it was unexpected.  Chaplain offered ministry of presence.  [name] [name] 12/24/2015 3:07 PM

## 2015-12-24 NOTE — Progress Notes (Signed)
SLP Cancellation Note  Patient Details Name: [name] MRN: [id number] DOB: 1961/06/11   Cancelled treatment:       Reason Eval/Treat Not Completed: Other (comment). Pt receiving care from RN. Will f/u as able for diet upgrade per MD note.    Shalayne Leach, Katherene Ponto 12/24/2015, 10:46 AM

## 2015-12-24 NOTE — Progress Notes (Addendum)
Patient ID: [name], female   DOB: 11-26-1960, 55 y.o.   MRN: [id number]    PROGRESS NOTE    [name]  TIW:[id number] DOB: [birth year] DOA: 12/18/2015  PCP: unknown    Brief Narrative:  55 year old Female, chronically ill looking, with history of MS, decubitus ulcers, documented AKI and CKD, DVT, CVA and malnutrition. Patient lives at home, but has neighbor and Home Health checking on her. On presentation to the hospital, UA suggestive of UTI, and CBC reveals elevated WBC with bandemia. Lactic acid is elevated, and Scr has risen from 0.54 to 3.32. Patient was on Metformin, bactrim, PRN lasix and Xarelto 20 MG po once daily prior to current admission.   Of note, pt was just recently discharged on November 18, 2015 after being treated for Sepsis due to Providencia and Coag Neg Staph Bacteremia and Pyelonephritis that was thought to be most likely from chronic indwelling catheter. Pt was discharged on Bactrim to complete therapy for 14 days upon discharge. During last admission, [place] team evaluated pt and noted several superficial dime sized decubitus ulcers on bilat buttocks and stage 4 sacral wounds that are now mostly healed w/ pink dry scar tissue with small remaining pressure injury in the center of each location; to sacrum, left and right ischium, each site is approx .1X.1X.1cm, pink and dry  Assessment & Plan:   Active Problems: Sepsis secondary to UTI from indwelling foley cath and left pyelonephritis, Staph and providencia stuarti bacteremia (3/4 blood cultures positive for staph), pt with neurogenic bladder  - pt met criteria for sepsis on admission with HR > 90 and WBC 14 K, source likely UTI, acute left pyelonephritis, Bacteremia, elevated lactic acid  - urine culture from 7/7 with providencia stuarti  and blood cultures from 7/7 with coag negative staph (3/4) and providencia stuarti  - unclear source, pt has chronic foley cath - continue vanc and Rocephin day #7 for now - ID  consulted and assistance is appreciated   Acute metabolic encephalopathy - secondary to the above imposed on dehydration, hypernatremia  - more alert, follows commands, more communicative  - I have not seen any family yet, asked RN to call me if family arrives   Decubitus ulcer, present on admission  - per last wound care report mostly healed, [facility] team re evaluated, stable  - keep on air-matress    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 Adhesions and constipation - tolerating clears so far  - had BM this AM - appreciate surgery team following - no indication for surgical intervention at this time   Left-sided obstructive nephrolithiasis - partially obstructive left-sided nephrolithiasis, with stones at the ureteropelvic junction and in the distal ureter just above the left ureterovesical junction - IR consult placed as recommended by urologist - pt needs left percutaneous nephrostomy tube placement, not emergent and can be delayed while Xarelto clears from her system but should be performed within the new few days - Continue IV antibiotics per ID for sepsis - Ultimately pt will need definitive treatment of her stone burden likely with left percutaneous nephrolithotomy once her sepsis has adequately been treated - appreciate urology team following   Hypokalemia - supplemented and WNL   Hypernatremia - Na is now WNL -  BMP in AM  Anemia of chronic disease  - no signs of bleeding  - CBC in AM  Hx of DVT  - on xarelto but had to be held as noted above   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  - reasonable inpatient control   MS - PT eval requested  - pt has very limited motion in upper and lower extremities but has so far refused SNF  DVT prophylaxis: on Xarelto but currently held  as noted above  Code Status: Full Family Communication: No family at bedside, I asked pt to have RN page me once family arrives but I have been waiting for family to call for several days Disposition Plan: To be determined   Consultants:   PCT  Surgery  ID  Urology   IR  SLP - dys II diet  Procedures:   None   Antimicrobials:   Zoxyn 7/7 --> changed to Rocephin 7/9 -->   Vanc 7/7 -->  Subjective: No events overnight.  Objective: Filed Vitals:   12/22/15 2112 12/23/15 0548 12/23/15 2124 12/24/15 0528  BP: 130/72 132/74 127/68 131/66  Pulse: 89 98 98 99  Temp: 99.6 F (37.6 C) 98.7 F (37.1 C) 98.2 F (36.8 C) 98.7 F (37.1 C)  TempSrc:  Oral Oral   Resp: '16 16 19 18  ' Height:      Weight:      SpO2: 97% 97% 100% 100%    Intake/Output Summary (Last 24 hours) at 12/24/15 1251 Last data filed at 12/24/15 1058  Gross per 24 hour  Intake   1630 ml  Output   3200 ml  Net  -1570 ml   Filed Weights   12/18/15 1031 12/18/15 1448  Weight: 77.111 kg (170 lb) 63.6 kg (140 lb 3.4 oz)    Examination:  General exam: Appears more alert, flat affect  Respiratory system: Respiratory effort normal. Diminished breath sounds at bases  Cardiovascular system: RRR. No JVD, murmurs, rubs, gallops or clicks. No pedal edema. Gastrointestinal system: Abdomen is distended, tympanic, slightly tender in epigastric area, diminished bowel sounds  Central nervous system: Alert strength in upper and lower extremities 2/5, sensation diminished in upper and lower extremities   Data Reviewed: I have personally reviewed following labs and imaging studies  CBC:  Recent Labs Lab 12/18/15 1035 12/19/15 0744 12/20/15 0646 12/21/15 0559 12/22/15 0735 12/23/15 0219  WBC 14.2* 14.8* 17.4* 15.9* 14.8* 14.7*  NEUTROABS 12.5*  --   --   --   --   --   HGB 10.6* 9.7* 10.4* 8.2* 8.1* 8.4*  HCT 32.6* 31.0* 33.1* 25.8* 24.6* 26.2*  MCV 84.7 86.4 86.6 83.8 83.7 83.4  PLT 345 360 361 359  353 537   Basic Metabolic Panel:  Recent Labs Lab 12/19/15 0744 12/20/15 0646 12/21/15 0559 12/22/15 0735 12/23/15 0219  NA 149* 149* 143 139 137  K 3.3* 3.1* 2.9* 4.3 4.0  CL 122* 120* 115* 108 107  CO2 21* 21* 21* 26 27  GLUCOSE 108* 112* 184* 152* 167*  BUN 40* 29* '19 10 6  ' CREATININE 1.48* 0.84 0.67 0.59 0.51  CALCIUM 9.2 9.8 9.1 9.0 9.1   Liver Function Tests:  Recent Labs Lab 12/18/15 1035 12/19/15 0744  AST 15 12*  ALT 8* 7*  ALKPHOS 40 50  BILITOT 0.7 0.5  PROT 6.6 6.6  ALBUMIN 2.2* 2.3*   CBG:  Recent Labs Lab 12/23/15 2124 12/24/15 0053 12/24/15 0532 12/24/15 0809 12/24/15 4827  GLUCAP 187* 146* 155* 136* 158*   Urine analysis:    Component Value Date/Time   COLORURINE YELLOW 12/18/2015 1118   APPEARANCEUR TURBID* 12/18/2015 1118   LABSPEC 1.014 12/18/2015 1118   PHURINE 8.0 12/18/2015 1118   GLUCOSEU NEGATIVE 12/18/2015 1118   HGBUR MODERATE* 12/18/2015 1118   BILIRUBINUR NEGATIVE 12/18/2015 1118   KETONESUR NEGATIVE 12/18/2015 1118   PROTEINUR 100* 12/18/2015 1118   UROBILINOGEN 0.2 12/20/2014 1851   NITRITE NEGATIVE 12/18/2015 1118   LEUKOCYTESUR LARGE* 12/18/2015 1118    Recent Results (from the past 240 hour(s))  Blood culture (routine x 2)     Status: Abnormal   Collection Time: 12/18/15 10:35 AM  Result Value Ref Range Status   Specimen Description BLOOD LEFT HAND  Final   Special Requests BOTTLES DRAWN AEROBIC AND ANAEROBIC  5CC  Final   Culture  Setup Time   Final    GRAM POSITIVE COCCI IN CLUSTERS IN BOTH AEROBIC AND ANAEROBIC BOTTLES CRITICAL RESULT CALLED TO, READ BACK BY AND VERIFIED WITH: J MARKEL,PHARMD AT 8299 12/19/15 BY L BENFIELD    Culture STAPHYLOCOCCUS SPECIES (COAGULASE NEGATIVE) (A)  Final   Report Status 12/21/2015 FINAL  Final   Organism ID, Bacteria STAPHYLOCOCCUS SPECIES (COAGULASE NEGATIVE)  Final      Susceptibility   Staphylococcus species (coagulase negative) - MIC*    CIPROFLOXACIN 1 SENSITIVE  Sensitive     ERYTHROMYCIN <=0.25 SENSITIVE Sensitive     GENTAMICIN <=0.5 SENSITIVE Sensitive     OXACILLIN 0.5 RESISTANT Resistant     TETRACYCLINE <=1 SENSITIVE Sensitive     VANCOMYCIN 1 SENSITIVE Sensitive     TRIMETH/SULFA 20 SENSITIVE Sensitive     CLINDAMYCIN <=0.25 SENSITIVE Sensitive     RIFAMPIN <=0.5 SENSITIVE Sensitive     Inducible Clindamycin NEGATIVE Sensitive     * STAPHYLOCOCCUS SPECIES (COAGULASE NEGATIVE)  Blood Culture ID Panel (Reflexed)     Status: Abnormal   Collection Time: 12/18/15 10:35 AM  Result Value Ref Range Status   Enterococcus species NOT DETECTED NOT DETECTED Final   Vancomycin resistance NOT DETECTED NOT DETECTED Final   Listeria monocytogenes NOT DETECTED NOT DETECTED Final   Staphylococcus species DETECTED (A) NOT DETECTED Final    Comment: CRITICAL RESULT CALLED TO, READ BACK BY AND VERIFIED WITH: J MARKEL,PHARMD AT 1417 12/19/15 BY L BENFIELD    Staphylococcus aureus NOT DETECTED NOT DETECTED Final   Methicillin resistance DETECTED (A) NOT DETECTED Final    Comment: CRITICAL RESULT CALLED TO, READ BACK BY AND VERIFIED WITH: J MARKEL,PHARMD AT 1417 12/19/15 BY L BENFIELD    Streptococcus species NOT DETECTED NOT DETECTED Final   Streptococcus agalactiae NOT DETECTED NOT DETECTED Final   Streptococcus pneumoniae NOT DETECTED NOT DETECTED Final   Streptococcus pyogenes NOT DETECTED NOT DETECTED Final   Acinetobacter baumannii NOT DETECTED NOT DETECTED Final   Enterobacteriaceae species NOT DETECTED NOT DETECTED Final   Enterobacter cloacae complex NOT DETECTED NOT DETECTED Final   Escherichia coli NOT DETECTED NOT DETECTED Final   Klebsiella oxytoca NOT DETECTED NOT DETECTED Final   Klebsiella pneumoniae NOT DETECTED NOT DETECTED Final   Proteus species NOT DETECTED NOT DETECTED Final   Serratia marcescens NOT DETECTED NOT DETECTED Final   Carbapenem resistance NOT DETECTED NOT DETECTED Final   Haemophilus influenzae NOT DETECTED NOT DETECTED  Final   Neisseria meningitidis NOT DETECTED NOT DETECTED Final   Pseudomonas aeruginosa NOT DETECTED NOT DETECTED Final   Candida albicans NOT DETECTED NOT DETECTED Final  Candida glabrata NOT DETECTED NOT DETECTED Final   Candida krusei NOT DETECTED NOT DETECTED Final   Candida parapsilosis NOT DETECTED NOT DETECTED Final   Candida tropicalis NOT DETECTED NOT DETECTED Final  Urine culture     Status: Abnormal   Collection Time: 12/18/15 11:18 AM  Result Value Ref Range Status   Specimen Description URINE, CATHETERIZED  Final   Special Requests Normal  Final   Culture >=100,000 COLONIES/mL PROVIDENCIA STUARTII (A)  Final   Report Status 12/20/2015 FINAL  Final   Organism ID, Bacteria PROVIDENCIA STUARTII (A)  Final      Susceptibility   Providencia stuartii - MIC*    AMPICILLIN 16 RESISTANT Resistant     CEFAZOLIN >=64 RESISTANT Resistant     CEFTRIAXONE <=1 SENSITIVE Sensitive     CIPROFLOXACIN >=4 RESISTANT Resistant     GENTAMICIN >=16 RESISTANT Resistant     IMIPENEM 2 SENSITIVE Sensitive     NITROFURANTOIN 128 RESISTANT Resistant     TRIMETH/SULFA 40 SENSITIVE Sensitive     AMPICILLIN/SULBACTAM 8 SENSITIVE Sensitive     PIP/TAZO <=4 SENSITIVE Sensitive     * >=100,000 COLONIES/mL PROVIDENCIA STUARTII  Blood culture (routine x 2)     Status: Abnormal   Collection Time: 12/18/15 12:00 PM  Result Value Ref Range Status   Specimen Description BLOOD RIGHT HAND  Final   Special Requests IN PEDIATRIC BOTTLE  3CC  Final   Culture  Setup Time   Final    GRAM POSITIVE COCCI IN CLUSTERS AEROBIC BOTTLE ONLY CRITICAL RESULT CALLED TO, READ BACK BY AND VERIFIED WITH: J MARKEL,PHARMD AT 1417 12/19/15 BY L BENFIELD    Culture STAPHYLOCOCCUS SPECIES (COAGULASE NEGATIVE) (A)  Final   Report Status 12/23/2015 FINAL  Final   Organism ID, Bacteria STAPHYLOCOCCUS SPECIES (COAGULASE NEGATIVE)  Final      Susceptibility   Staphylococcus species (coagulase negative) - MIC*    CIPROFLOXACIN  <=0.5 SENSITIVE Sensitive     ERYTHROMYCIN <=0.25 SENSITIVE Sensitive     GENTAMICIN <=0.5 SENSITIVE Sensitive     OXACILLIN <=0.25 SENSITIVE Sensitive     TETRACYCLINE <=1 SENSITIVE Sensitive     VANCOMYCIN <=0.5 SENSITIVE Sensitive     TRIMETH/SULFA 20 SENSITIVE Sensitive     CLINDAMYCIN <=0.25 SENSITIVE Sensitive     RIFAMPIN <=0.5 SENSITIVE Sensitive     Inducible Clindamycin NEGATIVE Sensitive     * STAPHYLOCOCCUS SPECIES (COAGULASE NEGATIVE)  MRSA PCR Screening     Status: None   Collection Time: 12/18/15  3:00 PM  Result Value Ref Range Status   MRSA by PCR NEGATIVE NEGATIVE Final    Comment:        The GeneXpert MRSA Assay (FDA approved for NASAL specimens only), is one component of a comprehensive MRSA colonization surveillance program. It is not intended to diagnose MRSA infection nor to guide or monitor treatment for MRSA infections.   Culture, blood (routine x 2)     Status: Abnormal   Collection Time: 12/18/15  7:57 PM  Result Value Ref Range Status   Specimen Description BLOOD LEFT ANTECUBITAL  Final   Special Requests IN PEDIATRIC BOTTLE  3CC  Final   Culture  Setup Time   Final    GRAM NEGATIVE RODS AEROBIC BOTTLE ONLY CRITICAL RESULT CALLED TO, READ BACK BY AND VERIFIED WITH: J [facility] 12/19/15 @ 61 M VESTAL    Culture PROVIDENCIA STUARTII (A)  Final   Report Status 12/21/2015 FINAL  Final   Organism ID, Bacteria PROVIDENCIA  STUARTII  Final      Susceptibility   Providencia stuartii - MIC*    AMPICILLIN 16 RESISTANT Resistant     CEFAZOLIN >=64 RESISTANT Resistant     CEFEPIME <=1 SENSITIVE Sensitive     CEFTAZIDIME <=1 SENSITIVE Sensitive     CEFTRIAXONE <=1 SENSITIVE Sensitive     CIPROFLOXACIN >=4 RESISTANT Resistant     GENTAMICIN 8 RESISTANT Resistant     IMIPENEM 1 SENSITIVE Sensitive     TRIMETH/SULFA <=20 SENSITIVE Sensitive     AMPICILLIN/SULBACTAM 4 SENSITIVE Sensitive     PIP/TAZO <=4 SENSITIVE Sensitive     * PROVIDENCIA STUARTII  Blood  Culture ID Panel (Reflexed)     Status: None   Collection Time: 12/18/15  7:57 PM  Result Value Ref Range Status   Enterococcus species NOT DETECTED NOT DETECTED Final   Vancomycin resistance NOT DETECTED NOT DETECTED Final   Listeria monocytogenes NOT DETECTED NOT DETECTED Final   Staphylococcus species NOT DETECTED NOT DETECTED Final   Staphylococcus aureus NOT DETECTED NOT DETECTED Final   Methicillin resistance NOT DETECTED NOT DETECTED Final   Streptococcus species NOT DETECTED NOT DETECTED Final   Streptococcus agalactiae NOT DETECTED NOT DETECTED Final   Streptococcus pneumoniae NOT DETECTED NOT DETECTED Final   Streptococcus pyogenes NOT DETECTED NOT DETECTED Final   Acinetobacter baumannii NOT DETECTED NOT DETECTED Final   Enterobacteriaceae species NOT DETECTED NOT DETECTED Final   Enterobacter cloacae complex NOT DETECTED NOT DETECTED Final   Escherichia coli NOT DETECTED NOT DETECTED Final   Klebsiella oxytoca NOT DETECTED NOT DETECTED Final   Klebsiella pneumoniae NOT DETECTED NOT DETECTED Final   Proteus species NOT DETECTED NOT DETECTED Final   Serratia marcescens NOT DETECTED NOT DETECTED Final   Carbapenem resistance NOT DETECTED NOT DETECTED Final   Haemophilus influenzae NOT DETECTED NOT DETECTED Final   Neisseria meningitidis NOT DETECTED NOT DETECTED Final   Pseudomonas aeruginosa NOT DETECTED NOT DETECTED Final   Candida albicans NOT DETECTED NOT DETECTED Final   Candida glabrata NOT DETECTED NOT DETECTED Final   Candida krusei NOT DETECTED NOT DETECTED Final   Candida parapsilosis NOT DETECTED NOT DETECTED Final   Candida tropicalis NOT DETECTED NOT DETECTED Final  Culture, blood (routine x 2)     Status: Abnormal   Collection Time: 12/18/15  8:21 PM  Result Value Ref Range Status   Specimen Description BLOOD RIGHT ANTECUBITAL  Final   Special Requests IN PEDIATRIC BOTTLE  1CC  Final   Culture  Setup Time   Final    GRAM POSITIVE COCCI IN CLUSTERS AEROBIC  BOTTLE ONLY CRITICAL RESULT CALLED TO, READ BACK BY AND VERIFIED WITH: J MARKEL 12/19/15 @ 1607 M VESTAL    Culture STAPHYLOCOCCUS SPECIES (COAGULASE NEGATIVE) (A)  Final   Report Status 12/22/2015 FINAL  Final   Organism ID, Bacteria STAPHYLOCOCCUS SPECIES (COAGULASE NEGATIVE)  Final      Susceptibility   Staphylococcus species (coagulase negative) - MIC*    CIPROFLOXACIN >=8 RESISTANT Resistant     ERYTHROMYCIN 0.5 SENSITIVE Sensitive     GENTAMICIN <=0.5 SENSITIVE Sensitive     OXACILLIN >=4 RESISTANT Resistant     TETRACYCLINE <=1 SENSITIVE Sensitive     VANCOMYCIN 1 SENSITIVE Sensitive     TRIMETH/SULFA 80 RESISTANT Resistant     CLINDAMYCIN <=0.25 SENSITIVE Sensitive     RIFAMPIN <=0.5 SENSITIVE Sensitive     Inducible Clindamycin NEGATIVE Sensitive     * STAPHYLOCOCCUS SPECIES (COAGULASE NEGATIVE)  Culture, blood (Routine  X 2) w Reflex to ID Panel     Status: None (Preliminary result)   Collection Time: 12/22/15  4:00 PM  Result Value Ref Range Status   Specimen Description BLOOD LEFT ANTECUBITAL  Final   Special Requests BOTTLES DRAWN AEROBIC AND ANAEROBIC 10CC  Final   Culture NO GROWTH 2 DAYS  Final   Report Status PENDING  Incomplete  Culture, blood (Routine X 2) w Reflex to ID Panel     Status: None (Preliminary result)   Collection Time: 12/22/15  4:15 PM  Result Value Ref Range Status   Specimen Description BLOOD LEFT FOREARM  Final   Special Requests BOTTLES DRAWN AEROBIC AND ANAEROBIC 10CC  Final   Culture NO GROWTH < 24 HOURS  Final   Report Status PENDING  Incomplete    Radiology Studies: Ct Abdomen Pelvis W Contrast 12/22/2015  Evidence of developing bowel obstruction or ileus, with transition point in the distal ileum. Etiology may be secondary to adhesion of the right lower quadrant, or could potentially be reactive given the significant inflammatory changes involving the urinary system and the urinary bladder. Ill-defined inflammatory/ infectious changes of the  low abdomen/ pelvis, where several viscera are involved. Inflammatory changes involve the dome of the urinary bladder, distal small bowel loops, and the sigmoid colon, with a source involving any of these. Favored etiology is secondary to chronic urinary tract infection given the imaging appearance over time and left-sided obstructive nephrolithiasis, however, diverticulitis not excluded. Persistent at least partially obstructive left-sided nephrolithiasis, with stones at the ureteropelvic junction and in the distal ureter just above the left ureterovesical junction. Inflammatory changes at the hilum of the left kidney have worsened since the comparison CT, compatible with pyelonephritis/urinary tract infection. Associated lymph nodes of the retroperitoneum. Aortic atherosclerosis. Trace bilateral pleural effusions.   Dg Abd 2 Views 12/20/2015  Diffuse gaseous distention of the small and large bowel, most suggestive of diffuse adynamic ileus. Distal small bowel obstruction cannot be excluded. If there is clinical concern for small bowel obstruction, consider correlation with CT abdomen/pelvis with oral and IV contrast. 2. Left renal stones.  Possible distal left ureteral stones.   Scheduled Meds: . baclofen  10 mg Oral TID  . cefTRIAXone (ROCEPHIN)  IV  2 g Intravenous Q24H  . darifenacin  7.5 mg Oral Daily  . famotidine  20 mg Oral Daily  . feeding supplement (ENSURE ENLIVE)  237 mL Oral BID BM  . gabapentin  100 mg Oral QHS  . insulin aspart  0-9 Units Subcutaneous Q4H  . multivitamin with minerals  1 tablet Oral Daily  . polyethylene glycol  17 g Oral Daily  . pravastatin  20 mg Oral q1800  . senna-docusate  1 tablet Oral BID  . vancomycin  750 mg Intravenous Q12H  . vitamin C  500 mg Oral Daily   Continuous Infusions: . dextrose 5 % with kcl 75 mL/hr at 12/24/15 0539    LOS: 6 days   Time spent: 20 minutes   Faye Ramsay, MD [facility] Pager [phone]  If 7PM-7AM,  please contact night-coverage [email or web address] Password [email or web address] 12/24/2015, 12:51 PM

## 2015-12-24 NOTE — Progress Notes (Signed)
   12/24/15 1400  Clinical Encounter Type  Visited With Patient;Family  Visit Type Follow-up  Spiritual Encounters  Spiritual Needs Emotional  Stress Factors  Patient Stress Factors Exhausted   Chaplain went to visit with the patient.  Patient had family in the room. Patient asked Chaplain to come back later in the afternoon to talk.  Vilinda Blanks Neilah Fulwider 12/24/2015 2:14 PM

## 2015-12-24 NOTE — Consult Note (Signed)
Chief Complaint: Patient was seen in consultation today for left percutaneous nephrostomy placement Chief Complaint  Patient presents with  . Altered Mental Status   at the request of Dr Franchot Gallo  Referring Physician(s): Dr Franchot Gallo  Supervising Physician: Corrie Mckusick  Patient Status: Inpatient  History of Present Illness: [name] is a 55 y.o. female   Hx progressive MS DVT; CVA Malnutrition  Hx multiple UTIs Presented to ED 7/7 with AMS Work up reveals New UTI; bacteremia +BC 7/7 Leukocytosis Now on antibiotics and better; baseline  CT 7/11: IMPRESSION: Evidence of developing bowel obstruction or ileus, with transition point in the distal ileum. Etiology may be secondary to adhesion of the right lower quadrant, or could potentially be reactive given the significant inflammatory changes involving the urinary system and the urinary bladder.  Ill-defined inflammatory/ infectious changes of the low abdomen/ pelvis, where several viscera are involved. Inflammatory changes involve the dome of the urinary bladder, distal small bowel loops, and the sigmoid colon, with a source involving any of these. Favored etiology is secondary to chronic urinary tract infection given the imaging appearance over time and left-sided obstructive nephrolithiasis, however, diverticulitis not excluded.  Persistent at least partially obstructive left-sided nephrolithiasis, with stones at the ureteropelvic junction and in the distal ureter just above the left ureterovesical junction. Inflammatory changes at the hilum of the left kidney have worsened since the comparison CT, compatible with pyelonephritis/urinary tract infection. Associated lymph nodes of the retroperitoneum.   Obstructive nephrolithiasis Sepsis Mild left hydronephrosis Request for left percutaneous nephrostomy in IR Imaging reviewed with Dr Earleen Newport He has discussed case with  Urology Approves procedure---scheduled for 7/14 LD Xarelto 7/12  415pm   Past Medical History  Diagnosis Date  . Diabetes mellitus without complication ([place])   . Multiple sclerosis diagnosed 2002-not on Therapy any longer 06/26/2012  . Stage 4 decubitus ulcer ([place]) 07/05/2015  . Acute renal failure superimposed on stage 3 chronic kidney disease ([place]) 07/21/2015  . Malnutrition of moderate degree 07/21/2015  . UTI (lower urinary tract infection)   . DVT (deep venous thrombosis) ([place])   . Stroke [facility])     Past Surgical History  Procedure Laterality Date  . Hernia mesh removal    . Tubes tided    . Tubal ligation    . Esophagogastroduodenoscopy Left 01/02/2015    Procedure: ESOPHAGOGASTRODUODENOSCOPY (EGD);  Surgeon: Arta Silence, MD;  Location: [facility];  Service: Endoscopy;  Laterality: Left;    Allergies: Review of patient's allergies indicates no known allergies.  Medications: Prior to Admission medications   Medication Sig Start Date End Date Taking? Authorizing Provider  Acetaminophen (APAP) 325 MG tablet Take 650 mg by mouth every 6 (six) hours as needed for pain.    Historical Provider, MD  albuterol (PROVENTIL HFA;VENTOLIN HFA) 108 (90 BASE) MCG/ACT inhaler Inhale 2 puffs into the lungs every 4 (four) hours as needed for wheezing or shortness of breath. 06/29/12   [name], MD  Amino Acids-Protein Hydrolys (FEEDING SUPPLEMENT, PRO-STAT SUGAR FREE 64,) LIQD Take 30 mLs by mouth 2 (two) times daily. 11/18/15   Cherene Altes, MD  baclofen (LIORESAL) 10 MG tablet Take 1 tablet (10 mg total) by mouth 3 (three) times daily. Patient taking differently: Take 10 mg by mouth 2 (two) times daily.  11/18/15   Cherene Altes, MD  famotidine (PEPCID) 20 MG tablet Take 1 tablet (20 mg total) by mouth 2 (two) times daily. 11/18/15   Kimberlee Nearing  Thereasa Solo, MD  furosemide (LASIX) 40 MG tablet Take 1 tablet (40 mg total) by mouth daily as needed for fluid or edema. Reported on  07/05/2015 Patient taking differently: Take 40 mg by mouth 2 (two) times daily. Reported on 07/05/2015 11/18/15   Cherene Altes, MD  gabapentin (NEURONTIN) 100 MG capsule Take 1 capsule (100 mg total) by mouth at bedtime. 11/18/15   Cherene Altes, MD  glipiZIDE (GLUCOTROL) 5 MG tablet Take 5 mg by mouth 2 (two) times daily. 11/30/15   Historical Provider, MD  HYDROcodone-acetaminophen (NORCO) 5-325 MG tablet Take 1 tablet by mouth every 6 (six) hours as needed for moderate pain. 11/18/15   Cherene Altes, MD  lisinopril (PRINIVIL,ZESTRIL) 2.5 MG tablet Take 2.5 mg by mouth daily. 11/30/15   Historical Provider, MD  lovastatin (MEVACOR) 20 MG tablet Take 20 mg by mouth daily. 11/30/15   Historical Provider, MD  metFORMIN (GLUCOPHAGE) 500 MG tablet Take 1 tablet (500 mg total) by mouth 2 (two) times daily. 11/18/15   Cherene Altes, MD  Multiple Vitamin (MULTIVITAMIN WITH MINERALS) TABS tablet Take 1 tablet by mouth daily. 07/09/15   Florencia Reasons, MD  Omega 3 1000 MG CAPS Take 1,000 mg by mouth daily.    Historical Provider, MD  polyethylene glycol (MIRALAX / GLYCOLAX) packet Take 17 g by mouth 2 (two) times daily. 11/18/15   Cherene Altes, MD  pravastatin (PRAVACHOL) 20 MG tablet Take 1 tablet (20 mg total) by mouth daily at 6 PM. 11/18/15   Cherene Altes, MD  rivaroxaban (XARELTO) 20 MG TABS tablet Take 1 tablet (20 mg total) by mouth daily with supper. Reported on 08/12/2015 11/18/15   Cherene Altes, MD  solifenacin (VESICARE) 5 MG tablet Take 1 tablet (5 mg total) by mouth daily. 11/18/15   Cherene Altes, MD  sulfamethoxazole-trimethoprim (BACTRIM DS,SEPTRA DS) 800-160 MG tablet Take 1 tablet by mouth every 12 (twelve) hours. 11/18/15   Cherene Altes, MD  vitamin C (ASCORBIC ACID) 500 MG tablet Take 500 mg by mouth daily.    Historical Provider, MD     Family History  Problem Relation Age of Onset  . Diabetes Mother   . Diabetes Father   . Diabetes Sister   . Scoliosis Sister   .  Alzheimer's disease Mother     Social History   Social History  . Marital Status: Single    Spouse Name: N/A  . Number of Children: N/A  . Years of Education: N/A   Social History Main Topics  . Smoking status: Former Smoker -- 1.00 packs/day for 20 years    Types: Cigarettes    Quit date: 11/19/2014  . Smokeless tobacco: Never Used  . Alcohol Use: 3.0 oz/week    5 Glasses of wine per week  . Drug Use: No  . Sexual Activity: Not Currently   Other Topics Concern  . 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              Review of Systems: A 12 point ROS discussed and pertinent positives are indicated in the HPI above.  All other systems are negative.  Review of Systems  Constitutional: Positive for fatigue. Negative for fever and activity change.  Respiratory: Negative for shortness of breath.   Genitourinary:       Foley catheter-  indwelling  Musculoskeletal: Positive for gait problem.  Neurological: Positive for weakness.  Psychiatric/Behavioral: Negative for behavioral problems and confusion.    Vital Signs: BP 131/66 mmHg  Pulse 99  Temp(Src) 98.7 F (37.1 C) (Oral)  Resp 18  Ht 5\' 3"  (1.6 m)  Wt 140 lb 3.4 oz (63.6 kg)  BMI 24.84 kg/m2  SpO2 100%  Physical Exam  Constitutional: She is oriented to person, place, and time.  Cardiovascular: Normal rate, regular rhythm and normal heart sounds.   Pulmonary/Chest: Effort normal and breath sounds normal. She has no wheezes.  Abdominal: Soft. Bowel sounds are normal.  Musculoskeletal: Normal range of motion.  B foot drop Feet in boots No real use of Rt hand Using L hand to write and sign consent  Neurological: She is alert and oriented to person, place, and time.  Skin: Skin is warm.  Psychiatric: She has a normal mood and affect. Her behavior is normal. Judgment and thought content normal.   Nursing note and vitals reviewed.   Mallampati Score:  MD Evaluation Airway: WNL Heart: WNL Abdomen: WNL Chest/ Lungs: WNL ASA  Classification: 3 Mallampati/Airway Score: Two  Imaging: Ct Head Wo Contrast  12/18/2015  CLINICAL DATA:  Altered mental status. EXAM: CT HEAD WITHOUT CONTRAST TECHNIQUE: Contiguous axial images were obtained from the base of the skull through the vertex without intravenous contrast. COMPARISON:  CT 08/31/2015.  MRI 07/21/2015. FINDINGS: Generalized brain atrophy. Chronic small-vessel ischemic changes of the deep white matter. No sign of acute infarction, mass lesion, hemorrhage, hydrocephalus or extra-axial collection. No calvarial abnormality. Sinuses, middle ears and mastoids are clear. IMPRESSION: No acute finding. Atrophy and chronic small-vessel disease of the white matter. Electronically Signed   By: Nelson Chimes M.D.   On: 12/18/2015 11:50   Ct Abdomen Pelvis W Contrast  12/22/2015  CLINICAL DATA:  55 year old female admitted 12/18/2015 with urinary tract infection and altered mental status. Acute renal failure. Prior ultrasound exam 12/18/2015 demonstrates evidence of pyelonephritis, left nephrolithiasis, and hydronephrosis. EXAM: CT ABDOMEN AND PELVIS WITH CONTRAST TECHNIQUE: Multidetector CT imaging of the abdomen and pelvis was performed using the standard protocol following bolus administration of intravenous contrast. CONTRAST:  149mL ISOVUE-300 IOPAMIDOL (ISOVUE-300) INJECTION 61% COMPARISON:  Ultrasound 12/18/2015, CT 12/15/2014 FINDINGS: Lower chest: Unremarkable appearance of the soft tissues of the chest wall. Heart size within normal limits.  No pericardial fluid/thickening. No lower mediastinal adenopathy. Unremarkable appearance of the distal esophagus. Small hiatal hernia Dependent atelectasis with trace bilateral pleural effusions. Abdomen/pelvis: Unremarkable appearance of liver and spleen. Unremarkable bilateral adrenal glands. Unremarkable  pancreas. Unremarkable gallbladder. Distended stomach, duodenum, and jejunum, with multiple air-fluid levels and dilated bowel loops. There is decompression of distal ileum in the right lower quadrant, with a transition on image 53. Mild to moderate stool burden.  Colonic diverticula. Within the low abdomen/pelvis there are ill-defined inflammatory changes with contracted appearance of the local anatomy including sigmoid colon, dome of the urinary bladder, adjacent small bowel loops, mesenteric, and the abdominal wall musculature (image 83). Small amount of free-fluid, with obscuration of the fat planes. Trace fluid throughout the abdomen.  No free air identified. Configuration the right kidney collecting system unchanged from the comparison CT, with extrarenal pelvis and mild dilation the proximal ureter. Unremarkable course of the right ureter with no stone identified. Similar appearance of left-sided nephrolithiasis with unchanged appearance of the dilated collecting system. The stones at the left ureteropelvic junction have increased in size. Unchanged position of distal left ureteral stone, just  above the ureterovesical junction. This stone is slightly increased in size now measuring 11 mm. Significant inflammatory changes associated with the left collecting system, with thickening and enhancement of the proximal left ureter and ureterovesical junction. Symmetric perfusion of the kidneys maintained. Urinary bladder is decompressed with urinary catheter in situ. Gas within the dome of the urinary bladder, nonspecific. The dome of the bladder is indistinguishable from inflammatory changes in the low abdomen/ pelvis, described above. Multiple retroperitoneal lymph nodes. Fibroid changes of the uterus. Scattered calcifications of the abdominal aorta. No dissection or aneurysm. Atherosclerotic changes at the origin of the mesenteric vessels, with no significant stenosis or occlusion identified. Musculoskeletal:  Degenerative changes of the spine. No significant bony canal narrowing. No displaced fracture. IMPRESSION: Evidence of developing bowel obstruction or ileus, with transition point in the distal ileum. Etiology may be secondary to adhesion of the right lower quadrant, or could potentially be reactive given the significant inflammatory changes involving the urinary system and the urinary bladder. Ill-defined inflammatory/ infectious changes of the low abdomen/ pelvis, where several viscera are involved. Inflammatory changes involve the dome of the urinary bladder, distal small bowel loops, and the sigmoid colon, with a source involving any of these. Favored etiology is secondary to chronic urinary tract infection given the imaging appearance over time and left-sided obstructive nephrolithiasis, however, diverticulitis not excluded. Persistent at least partially obstructive left-sided nephrolithiasis, with stones at the ureteropelvic junction and in the distal ureter just above the left ureterovesical junction. Inflammatory changes at the hilum of the left kidney have worsened since the comparison CT, compatible with pyelonephritis/urinary tract infection. Associated lymph nodes of the retroperitoneum. Aortic atherosclerosis. Trace bilateral pleural effusions. Signed, Dulcy Fanny. Earleen Newport, DO [facility] Electronically Signed   By: Corrie Mckusick D.O.   On: 12/22/2015 07:45   US Renal  12/18/2015  CLINICAL DATA:  Altered mental status.  UTI. EXAM: RENAL / URINARY TRACT ULTRASOUND COMPLETE COMPARISON:  07/21/2015 FINDINGS: Right Kidney: Length: 14 cm. Mild right hydronephrosis, similar to 07/21/2015 sonogram and abdominal CT 12/15/2014. Echogenic cortex Left Kidney: Length: 15.7 cm. Confluent calculi in the renal pelvis with the largest discrete stone measuring 22 mm. There is chronic hydronephrosis. Urinary collecting system debris. A non shadowing rounded  echogenic structure in the collecting system superiorly is likely also debris. Lower pole cyst, both hilar and cortical with bilobed appearance, up to 73 mm in diameter and stable/simple. Bladder: History of Foley catheter.  The bladder is collapsed. IMPRESSION: 1. Collecting system debris on the left as seen with infection or hemorrhage. Asymmetric enlargement of the left kidney, a sign seen with pyelonephritis. No indication of abscess. 2. Chronic mild bilateral hydronephrosis. 3. Extensive left nephrolithiasis. 4. Chronically enlarged and echogenic kidneys, favored secondary to patient's diabetes. Electronically Signed   By: Monte Fantasia M.D.   On: 12/18/2015 17:14   Dg Chest Port 1 View  12/18/2015  CLINICAL DATA:  Weakness, altered mental status EXAM: PORTABLE CHEST 1 VIEW COMPARISON:  Chest x-rays dated 11/11/2015 and 12/14/2014. FINDINGS: Heart size is normal. Overall cardiomediastinal silhouette is normal in size and configuration. Study is hypoinspiratory with crowding of the perihilar bronchovascular markings. Given the low lung volumes, lungs appear clear. Questionable mild atelectasis or small pleural effusion at the left costophrenic angle. IMPRESSION: Questionable mild atelectasis or small pleural effusion at the left costophrenic angle. Lungs otherwise clear. No evidence of pneumonia. No large pleural effusion. Heart size normal. Electronically Signed   By: Cherlynn Kaiser  Enriqueta Shutter M.D.   On: 12/18/2015 11:17   Dg Abd 2 Views  12/20/2015  CLINICAL DATA:  Abdominal distention EXAM: ABDOMEN - 2 VIEW COMPARISON:  11/11/2015 abdominal radiograph FINDINGS: Diffuse gaseous distention of the small and large bowel. No evidence of pneumatosis or pneumoperitoneum. Clustered 28 x 11 mm and 22 x 11 mm left renal stones appear stable. Clustered 19 x 6 mm and 4 mm left deep pelvic calcifications, cannot exclude distal left ureteral stones. IMPRESSION: 1. Diffuse gaseous distention of the small and large bowel, most  suggestive of diffuse adynamic ileus. Distal small bowel obstruction cannot be excluded. If there is clinical concern for small bowel obstruction, consider correlation with CT abdomen/pelvis with oral and IV contrast. 2. Left renal stones.  Possible distal left ureteral stones. Electronically Signed   By: Ilona Sorrel M.D.   On: 12/20/2015 11:48    Labs:  CBC:  Recent Labs  12/20/15 0646 12/21/15 0559 12/22/15 0735 12/23/15 0219  WBC 17.4* 15.9* 14.8* 14.7*  HGB 10.4* 8.2* 8.1* 8.4*  HCT 33.1* 25.8* 24.6* 26.2*  PLT 361 359 353 382    COAGS:  Recent Labs  01/02/15 0413 06/01/15 1930 07/05/15 2318 11/11/15 1447  INR 1.69* 1.10 1.14 1.86*  APTT  --   --  32 41*    BMP:  Recent Labs  12/20/15 0646 12/21/15 0559 12/22/15 0735 12/23/15 0219  NA 149* 143 139 137  K 3.1* 2.9* 4.3 4.0  CL 120* 115* 108 107  CO2 21* 21* 26 27  GLUCOSE 112* 184* 152* 167*  BUN 29* 19 10 6   CALCIUM 9.8 9.1 9.0 9.1  CREATININE 0.84 0.67 0.59 0.51  GFRNONAA >60 >60 >60 >60  GFRAA >60 >60 >60 >60    LIVER FUNCTION TESTS:  Recent Labs  11/16/15 0334 11/17/15 0709 12/18/15 1035 12/19/15 0744  BILITOT 0.6 0.4 0.7 0.5  AST 11* 9* 15 12*  ALT 19 15 8* 7*  ALKPHOS 75 62 40 50  PROT 5.8* 6.0* 6.6 6.6  ALBUMIN 1.9* 2.0* 2.2* 2.3*    TUMOR MARKERS: No results for input(s): AFPTM, CEA, CA199, CHROMGRNA in the last 8760 hours.  Assessment and Plan:  Left obstructive nephrolithiasis Mild L hydronephrosis Scheduled for L percutaneous nephrostomy in am LD Xarelto 7/12  415 pm Risks and Benefits discussed with the patient including, but not limited to infection, bleeding, significant bleeding causing loss or decrease in renal function or damage to adjacent structures.  All of the patient's questions were answered, patient is agreeable to proceed. Consent signed and in chart.   Thank you for this interesting consult.  I greatly enjoyed meeting QUINETTA WESTLY and look forward to  participating in their care.  A copy of this report was sent to the requesting provider on this date.  Electronically Signed: Jarrett Chicoine A 12/24/2015, 4:18 PM   I spent a total of 40 Minutes    in face to face in clinical consultation, greater than 50% of which was counseling/coordinating care for left PCN

## 2015-12-24 NOTE — Progress Notes (Signed)
Patient ID: [name], female   DOB: 19-Jul-1960, 55 y.o.   MRN: [id number] Tolerating clears, had BM Abdomen soft Noted urology/IR plan Advance diet as tolerated  Recall [place] PRN  Georganna Skeans, MD, MPH, FACS Trauma: [phone] General Surgery: [phone]

## 2015-12-24 NOTE — Progress Notes (Signed)
[facility] for Infectious Disease    Date of Admission:  12/18/2015     Total of 6 days on antibiotics: Vancomycin and ceftriaxone  Principal Problem:   Catheter-associated urinary tract infection (HCC) Active Problems:   Bacteremia due to coagulase-negative Staphylococcus   Gram-negative bacteremia (HCC)   Left nephrolithiasis   SIRS (systemic inflammatory response syndrome) ([place])   Palliative care encounter   Partial small bowel obstruction ([place])   . baclofen  10 mg Oral TID  . cefTRIAXone (ROCEPHIN)  IV  2 g Intravenous Q24H  . darifenacin  7.5 mg Oral Daily  . famotidine  20 mg Oral Daily  . feeding supplement (ENSURE ENLIVE)  237 mL Oral BID BM  . gabapentin  100 mg Oral QHS  . insulin aspart  0-9 Units Subcutaneous Q4H  . multivitamin with minerals  1 tablet Oral Daily  . polyethylene glycol  17 g Oral Daily  . pravastatin  20 mg Oral q1800  . senna-docusate  1 tablet Oral BID  . vancomycin  750 mg Intravenous Q12H  . vitamin C  500 mg Oral Daily    SUBJECTIVE: [name] continues to have abdominal pain but is tolerating liquids and passing bowel movements. She has not felt any more fever or chills.  Review of Systems: Review of Systems  Respiratory: Negative for shortness of breath.   Cardiovascular: Negative for leg swelling.  Gastrointestinal: Positive for abdominal pain. Negative for nausea, vomiting and blood in stool.  Genitourinary: Negative for hematuria.  Neurological: Positive for weakness.    Past Medical History  Diagnosis Date  . Diabetes mellitus without complication ([place])   . Multiple sclerosis diagnosed 2002-not on Therapy any longer 06/26/2012  . Stage 4 decubitus ulcer ([place]) 07/05/2015  . Acute renal failure superimposed on stage 3 chronic kidney disease ([place]) 07/21/2015  . Malnutrition of moderate degree 07/21/2015  . UTI (lower urinary tract  infection)   . DVT (deep venous thrombosis) ([place])   . Stroke [facility])     Social History  Substance Use Topics  . Smoking status: Former Smoker -- 1.00 packs/day for 20 years    Types: Cigarettes    Quit date: 11/19/2014  . Smokeless tobacco: Never Used  . Alcohol Use: 3.0 oz/week    5 Glasses of wine per week    Family History  Problem Relation Age of Onset  . Diabetes Mother   . Diabetes Father   . Diabetes Sister   . Scoliosis Sister   . Alzheimer's disease Mother    No Known Allergies  OBJECTIVE: Filed Vitals:   12/22/15 2112 12/23/15 0548 12/23/15 2124 12/24/15 0528  BP: 130/72 132/74 127/68 131/66  Pulse: 89 98 98 99  Temp: 99.6 F (37.6 C) 98.7 F (37.1 C) 98.2 F (36.8 C) 98.7 F (37.1 C)  TempSrc:  Oral Oral   Resp: 16 16 19 18   Height:      Weight:      SpO2: 97% 97% 100% 100%   Body mass index is 24.84 kg/(m^2).  Physical Exam GENERAL- chronically ill appearing, no acute distress CARDIAC- Systolic murmur over left upper sternal border RESP- CTAB, poor inspiratory effort ABDOMEN- Diffusely tender to palpation worst over left upper quadrant, bowel sounds present throughout EXTREMITIES- Thick keratotic skin changes over dorsum of feet bilaterally, no heel or foot ulcers noted PSYCH- Flat affect   Lab Results Lab Results  Component Value Date   WBC 14.7* 12/23/2015  HGB 8.4* 12/23/2015   HCT 26.2* 12/23/2015   MCV 83.4 12/23/2015   PLT 382 12/23/2015    Lab Results  Component Value Date   CREATININE 0.51 12/23/2015   BUN 6 12/23/2015   NA 137 12/23/2015   K 4.0 12/23/2015   CL 107 12/23/2015   CO2 27 12/23/2015    Lab Results  Component Value Date   ALT 7* 12/19/2015   AST 12* 12/19/2015   ALKPHOS 50 12/19/2015   BILITOT 0.5 12/19/2015     Microbiology: Recent Results (from the past 240 hour(s))  Blood culture (routine x 2)     Status: Abnormal   Collection Time: 12/18/15 10:35 AM  Result Value Ref Range Status   Specimen  Description BLOOD LEFT HAND  Final   Special Requests BOTTLES DRAWN AEROBIC AND ANAEROBIC  5CC  Final   Culture  Setup Time   Final    GRAM POSITIVE COCCI IN CLUSTERS IN BOTH AEROBIC AND ANAEROBIC BOTTLES CRITICAL RESULT CALLED TO, READ BACK BY AND VERIFIED WITH: J MARKEL,PHARMD AT [id number] 12/19/15 BY L BENFIELD    Culture STAPHYLOCOCCUS SPECIES (COAGULASE NEGATIVE) (A)  Final   Report Status 12/21/2015 FINAL  Final   Organism ID, Bacteria STAPHYLOCOCCUS SPECIES (COAGULASE NEGATIVE)  Final      Susceptibility   Staphylococcus species (coagulase negative) - MIC*    CIPROFLOXACIN 1 SENSITIVE Sensitive     ERYTHROMYCIN <=0.25 SENSITIVE Sensitive     GENTAMICIN <=0.5 SENSITIVE Sensitive     OXACILLIN 0.5 RESISTANT Resistant     TETRACYCLINE <=1 SENSITIVE Sensitive     VANCOMYCIN 1 SENSITIVE Sensitive     TRIMETH/SULFA 20 SENSITIVE Sensitive     CLINDAMYCIN <=0.25 SENSITIVE Sensitive     RIFAMPIN <=0.5 SENSITIVE Sensitive     Inducible Clindamycin NEGATIVE Sensitive     * STAPHYLOCOCCUS SPECIES (COAGULASE NEGATIVE)  Blood Culture ID Panel (Reflexed)     Status: Abnormal   Collection Time: 12/18/15 10:35 AM  Result Value Ref Range Status   Enterococcus species NOT DETECTED NOT DETECTED Final   Vancomycin resistance NOT DETECTED NOT DETECTED Final   Listeria monocytogenes NOT DETECTED NOT DETECTED Final   Staphylococcus species DETECTED (A) NOT DETECTED Final    Comment: CRITICAL RESULT CALLED TO, READ BACK BY AND VERIFIED WITH: J MARKEL,PHARMD AT 1417 12/19/15 BY L BENFIELD    Staphylococcus aureus NOT DETECTED NOT DETECTED Final   Methicillin resistance DETECTED (A) NOT DETECTED Final    Comment: CRITICAL RESULT CALLED TO, READ BACK BY AND VERIFIED WITH: J MARKEL,PHARMD AT 1417 12/19/15 BY L BENFIELD    Streptococcus species NOT DETECTED NOT DETECTED Final   Streptococcus agalactiae NOT DETECTED NOT DETECTED Final   Streptococcus pneumoniae NOT DETECTED NOT DETECTED Final   Streptococcus  pyogenes NOT DETECTED NOT DETECTED Final   Acinetobacter baumannii NOT DETECTED NOT DETECTED Final   Enterobacteriaceae species NOT DETECTED NOT DETECTED Final   Enterobacter cloacae complex NOT DETECTED NOT DETECTED Final   Escherichia coli NOT DETECTED NOT DETECTED Final   Klebsiella oxytoca NOT DETECTED NOT DETECTED Final   Klebsiella pneumoniae NOT DETECTED NOT DETECTED Final   Proteus species NOT DETECTED NOT DETECTED Final   Serratia marcescens NOT DETECTED NOT DETECTED Final   Carbapenem resistance NOT DETECTED NOT DETECTED Final   Haemophilus influenzae NOT DETECTED NOT DETECTED Final   Neisseria meningitidis NOT DETECTED NOT DETECTED Final   Pseudomonas aeruginosa NOT DETECTED NOT DETECTED Final   Candida albicans NOT DETECTED NOT DETECTED Final  Candida glabrata NOT DETECTED NOT DETECTED Final   Candida krusei NOT DETECTED NOT DETECTED Final   Candida parapsilosis NOT DETECTED NOT DETECTED Final   Candida tropicalis NOT DETECTED NOT DETECTED Final  Urine culture     Status: Abnormal   Collection Time: 12/18/15 11:18 AM  Result Value Ref Range Status   Specimen Description URINE, CATHETERIZED  Final   Special Requests Normal  Final   Culture >=100,000 COLONIES/mL PROVIDENCIA STUARTII (A)  Final   Report Status 12/20/2015 FINAL  Final   Organism ID, Bacteria PROVIDENCIA STUARTII (A)  Final      Susceptibility   Providencia stuartii - MIC*    AMPICILLIN 16 RESISTANT Resistant     CEFAZOLIN >=64 RESISTANT Resistant     CEFTRIAXONE <=1 SENSITIVE Sensitive     CIPROFLOXACIN >=4 RESISTANT Resistant     GENTAMICIN >=16 RESISTANT Resistant     IMIPENEM 2 SENSITIVE Sensitive     NITROFURANTOIN 128 RESISTANT Resistant     TRIMETH/SULFA 40 SENSITIVE Sensitive     AMPICILLIN/SULBACTAM 8 SENSITIVE Sensitive     PIP/TAZO <=4 SENSITIVE Sensitive     * >=100,000 COLONIES/mL PROVIDENCIA STUARTII  Blood culture (routine x 2)     Status: Abnormal   Collection Time: 12/18/15 12:00 PM    Result Value Ref Range Status   Specimen Description BLOOD RIGHT HAND  Final   Special Requests IN PEDIATRIC BOTTLE  3CC  Final   Culture  Setup Time   Final    GRAM POSITIVE COCCI IN CLUSTERS AEROBIC BOTTLE ONLY CRITICAL RESULT CALLED TO, READ BACK BY AND VERIFIED WITH: J MARKEL,PHARMD AT 1417 12/19/15 BY L BENFIELD    Culture STAPHYLOCOCCUS SPECIES (COAGULASE NEGATIVE) (A)  Final   Report Status 12/23/2015 FINAL  Final   Organism ID, Bacteria STAPHYLOCOCCUS SPECIES (COAGULASE NEGATIVE)  Final      Susceptibility   Staphylococcus species (coagulase negative) - MIC*    CIPROFLOXACIN <=0.5 SENSITIVE Sensitive     ERYTHROMYCIN <=0.25 SENSITIVE Sensitive     GENTAMICIN <=0.5 SENSITIVE Sensitive     OXACILLIN <=0.25 SENSITIVE Sensitive     TETRACYCLINE <=1 SENSITIVE Sensitive     VANCOMYCIN <=0.5 SENSITIVE Sensitive     TRIMETH/SULFA 20 SENSITIVE Sensitive     CLINDAMYCIN <=0.25 SENSITIVE Sensitive     RIFAMPIN <=0.5 SENSITIVE Sensitive     Inducible Clindamycin NEGATIVE Sensitive     * STAPHYLOCOCCUS SPECIES (COAGULASE NEGATIVE)  MRSA PCR Screening     Status: None   Collection Time: 12/18/15  3:00 PM  Result Value Ref Range Status   MRSA by PCR NEGATIVE NEGATIVE Final    Comment:        The GeneXpert MRSA Assay (FDA approved for NASAL specimens only), is one component of a comprehensive MRSA colonization surveillance program. It is not intended to diagnose MRSA infection nor to guide or monitor treatment for MRSA infections.   Culture, blood (routine x 2)     Status: Abnormal   Collection Time: 12/18/15  7:57 PM  Result Value Ref Range Status   Specimen Description BLOOD LEFT ANTECUBITAL  Final   Special Requests IN PEDIATRIC BOTTLE  3CC  Final   Culture  Setup Time   Final    GRAM NEGATIVE RODS AEROBIC BOTTLE ONLY CRITICAL RESULT CALLED TO, READ BACK BY AND VERIFIED WITH: J [facility] 12/19/15 @ 41 M VESTAL    Culture PROVIDENCIA STUARTII (A)  Final   Report Status  12/21/2015 FINAL  Final   Organism ID,  Bacteria PROVIDENCIA STUARTII  Final      Susceptibility   Providencia stuartii - MIC*    AMPICILLIN 16 RESISTANT Resistant     CEFAZOLIN >=64 RESISTANT Resistant     CEFEPIME <=1 SENSITIVE Sensitive     CEFTAZIDIME <=1 SENSITIVE Sensitive     CEFTRIAXONE <=1 SENSITIVE Sensitive     CIPROFLOXACIN >=4 RESISTANT Resistant     GENTAMICIN 8 RESISTANT Resistant     IMIPENEM 1 SENSITIVE Sensitive     TRIMETH/SULFA <=20 SENSITIVE Sensitive     AMPICILLIN/SULBACTAM 4 SENSITIVE Sensitive     PIP/TAZO <=4 SENSITIVE Sensitive     * PROVIDENCIA STUARTII  Blood Culture ID Panel (Reflexed)     Status: None   Collection Time: 12/18/15  7:57 PM  Result Value Ref Range Status   Enterococcus species NOT DETECTED NOT DETECTED Final   Vancomycin resistance NOT DETECTED NOT DETECTED Final   Listeria monocytogenes NOT DETECTED NOT DETECTED Final   Staphylococcus species NOT DETECTED NOT DETECTED Final   Staphylococcus aureus NOT DETECTED NOT DETECTED Final   Methicillin resistance NOT DETECTED NOT DETECTED Final   Streptococcus species NOT DETECTED NOT DETECTED Final   Streptococcus agalactiae NOT DETECTED NOT DETECTED Final   Streptococcus pneumoniae NOT DETECTED NOT DETECTED Final   Streptococcus pyogenes NOT DETECTED NOT DETECTED Final   Acinetobacter baumannii NOT DETECTED NOT DETECTED Final   Enterobacteriaceae species NOT DETECTED NOT DETECTED Final   Enterobacter cloacae complex NOT DETECTED NOT DETECTED Final   Escherichia coli NOT DETECTED NOT DETECTED Final   Klebsiella oxytoca NOT DETECTED NOT DETECTED Final   Klebsiella pneumoniae NOT DETECTED NOT DETECTED Final   Proteus species NOT DETECTED NOT DETECTED Final   Serratia marcescens NOT DETECTED NOT DETECTED Final   Carbapenem resistance NOT DETECTED NOT DETECTED Final   Haemophilus influenzae NOT DETECTED NOT DETECTED Final   Neisseria meningitidis NOT DETECTED NOT DETECTED Final   Pseudomonas  aeruginosa NOT DETECTED NOT DETECTED Final   Candida albicans NOT DETECTED NOT DETECTED Final   Candida glabrata NOT DETECTED NOT DETECTED Final   Candida krusei NOT DETECTED NOT DETECTED Final   Candida parapsilosis NOT DETECTED NOT DETECTED Final   Candida tropicalis NOT DETECTED NOT DETECTED Final  Culture, blood (routine x 2)     Status: Abnormal   Collection Time: 12/18/15  8:21 PM  Result Value Ref Range Status   Specimen Description BLOOD RIGHT ANTECUBITAL  Final   Special Requests IN PEDIATRIC BOTTLE  1CC  Final   Culture  Setup Time   Final    GRAM POSITIVE COCCI IN CLUSTERS AEROBIC BOTTLE ONLY CRITICAL RESULT CALLED TO, READ BACK BY AND VERIFIED WITH: J MARKEL 12/19/15 @ 1607 M VESTAL    Culture STAPHYLOCOCCUS SPECIES (COAGULASE NEGATIVE) (A)  Final   Report Status 12/22/2015 FINAL  Final   Organism ID, Bacteria STAPHYLOCOCCUS SPECIES (COAGULASE NEGATIVE)  Final      Susceptibility   Staphylococcus species (coagulase negative) - MIC*    CIPROFLOXACIN >=8 RESISTANT Resistant     ERYTHROMYCIN 0.5 SENSITIVE Sensitive     GENTAMICIN <=0.5 SENSITIVE Sensitive     OXACILLIN >=4 RESISTANT Resistant     TETRACYCLINE <=1 SENSITIVE Sensitive     VANCOMYCIN 1 SENSITIVE Sensitive     TRIMETH/SULFA 80 RESISTANT Resistant     CLINDAMYCIN <=0.25 SENSITIVE Sensitive     RIFAMPIN <=0.5 SENSITIVE Sensitive     Inducible Clindamycin NEGATIVE Sensitive     * STAPHYLOCOCCUS SPECIES (COAGULASE NEGATIVE)  Culture, blood (  Routine X 2) w Reflex to ID Panel     Status: None (Preliminary result)   Collection Time: 12/22/15  4:00 PM  Result Value Ref Range Status   Specimen Description BLOOD LEFT ANTECUBITAL  Final   Special Requests BOTTLES DRAWN AEROBIC AND ANAEROBIC 10CC  Final   Culture NO GROWTH 2 DAYS  Final   Report Status PENDING  Incomplete  Culture, blood (Routine X 2) w Reflex to ID Panel     Status: None (Preliminary result)   Collection Time: 12/22/15  4:15 PM  Result Value Ref  Range Status   Specimen Description BLOOD LEFT FOREARM  Final   Special Requests BOTTLES DRAWN AEROBIC AND ANAEROBIC 10CC  Final   Culture NO GROWTH < 24 HOURS  Final   Report Status PENDING  Incomplete     ASSESSMENT: Complicated polymicrobial UTI complicated by bacteremia: [name] is responding to treatment so far with negative repeated blood cultures from 7/11 and partial improvement of her symptoms. She will remain for several days during anticoagulation wash out awaiting bypass of her obstruction, before eventual removal of her large stones to hopefully prevent rapid recurrence of her infection. She needs treatment for a few weeks total so we will discuss placing a PICC with her by tomorrow. This may complicate planning as she does not want to return to a SNF at all but requires a high level of care.  PLAN: 1. Continue vancomycin and ceftriaxone  Collier Salina, MD PGY-II Internal Medicine Resident Pager# [phone] 12/24/2015, 2:27 PM

## 2015-12-24 NOTE — Progress Notes (Signed)
Speech Language Pathology Treatment: Dysphagia  Patient Details Name: MICHAELINA TAMBURO MRN: [id number] DOB: 1961-05-15 Today's Date: 12/24/2015 Time: [id number] SLP Time Calculation (min) (ACUTE ONLY): 12 min  Assessment / Plan / Recommendation Clinical Impression  Pt demonstrates tolerance of thin liquids via straw, consistently. Pt also willing to attempt bites of soft fruit, consumed about 50% of cup. Mastication was slightly prolonged but thorough. Pt appears to have a bit more energy than during the last session. SLP had to cue pt to sit fully upright, ut otherwise pt behavior was appropriate. SLP will f/u for tolerance.    HPI HPI: KENDY PATZER is a 55 y.o. female, chronically ill looking, with history of MS, decubitus ulcers, documented AKI and CKD, UTI, DVT, CVA and malnutrition, admitted on 12/18/2015 with sepsis, UA done suggestive of UTI.       SLP Plan  Continue with current plan of care     Recommendations  Diet recommendations: Dysphagia 2 (fine chop);Thin liquid Liquids provided via: Straw;Cup Medication Administration: Whole meds with liquid Supervision: Staff to assist with self feeding;Full supervision/cueing for compensatory strategies Compensations: Slow rate;Small sips/bites Postural Changes and/or Swallow Maneuvers: Seated upright 90 degrees             Plan: Continue with current plan of care     GO               [facility], MA CCC-SLP [id number]  Lynann Beaver 12/24/2015, 11:44 AM

## 2015-12-24 NOTE — Care Management Important Message (Signed)
Important Message  Patient Details  Name: [name] MRN: [id number] Date of Birth: 1960/12/15   Medicare Important Message Given:  Yes    Nathen May 12/24/2015, 1:39 PM

## 2015-12-24 NOTE — Care Management Note (Signed)
Case Management Note  Patient Details  Name: ELLENY ZUVER MRN: [id number] Date of Birth: 03/25/1961  Subjective/Objective:                 Spoke with patient in the room. She states that her friend Gene whom she lives with cares for her all day and works third shift. While he is at work during the night, they plan for another friend Delfino Lovett to stay with her. Spoke with patient about [facility] services, she would like to use [facility]. Patient declined needing any DME. Patient has h/o MS and states she already has all the equipment she needs for now at home. Referral made to [facility] for PT OT RN [facility]HA SW. Will await orders at DC.    Action/Plan:  DC to home with caregivers and HH.  Expected Discharge Date:                  Expected Discharge Plan:  [place]  In-House Referral:     Discharge planning Services  CM Consult  Post Acute Care Choice:  Home Health Choice offered to:  Patient  DME Arranged:    DME Agency:     HH Arranged:    [place] Agency:  [facility]  Status of Service:  In process, will continue to follow  If discussed at Long Length of Stay Meetings, dates discussed:    Additional Comments:  Carles Collet, RN 12/24/2015, 12:29 PM

## 2015-12-25 ENCOUNTER — Inpatient Hospital Stay (HOSPITAL_COMMUNITY): Payer: Medicare Other

## 2015-12-25 DIAGNOSIS — N390 Urinary tract infection, site not specified: Secondary | ICD-10-CM

## 2015-12-25 DIAGNOSIS — D696 Thrombocytopenia, unspecified: Secondary | ICD-10-CM

## 2015-12-25 DIAGNOSIS — D72829 Elevated white blood cell count, unspecified: Secondary | ICD-10-CM

## 2015-12-25 DIAGNOSIS — N179 Acute kidney failure, unspecified: Secondary | ICD-10-CM | POA: Insufficient documentation

## 2015-12-25 LAB — APTT: APTT: 35 s (ref 24–37)

## 2015-12-25 LAB — CBC
HCT: 25.3 % — ABNORMAL LOW (ref 36.0–46.0)
HEMOGLOBIN: 8.3 g/dL — AB (ref 12.0–15.0)
MCH: 27.4 pg (ref 26.0–34.0)
MCHC: 32.8 g/dL (ref 30.0–36.0)
MCV: 83.5 fL (ref 78.0–100.0)
PLATELETS: 522 10*3/uL — AB (ref 150–400)
RBC: 3.03 MIL/uL — AB (ref 3.87–5.11)
RDW: 19.9 % — ABNORMAL HIGH (ref 11.5–15.5)
WBC: 21.5 10*3/uL — AB (ref 4.0–10.5)

## 2015-12-25 LAB — BASIC METABOLIC PANEL
Anion gap: 4 — ABNORMAL LOW (ref 5–15)
CHLORIDE: 107 mmol/L (ref 101–111)
CO2: 26 mmol/L (ref 22–32)
Calcium: 9.6 mg/dL (ref 8.9–10.3)
Creatinine, Ser: 0.52 mg/dL (ref 0.44–1.00)
Glucose, Bld: 130 mg/dL — ABNORMAL HIGH (ref 65–99)
POTASSIUM: 4.2 mmol/L (ref 3.5–5.1)
SODIUM: 137 mmol/L (ref 135–145)

## 2015-12-25 LAB — GLUCOSE, CAPILLARY
Glucose-Capillary: 164 mg/dL — ABNORMAL HIGH (ref 65–99)
Glucose-Capillary: 133 mg/dL — ABNORMAL HIGH (ref 65–99)
Glucose-Capillary: 137 mg/dL — ABNORMAL HIGH (ref 65–99)
Glucose-Capillary: 108 mg/dL — ABNORMAL HIGH (ref 65–99)
Glucose-Capillary: 134 mg/dL — ABNORMAL HIGH (ref 65–99)
Glucose-Capillary: 133 mg/dL — ABNORMAL HIGH (ref 65–99)

## 2015-12-25 LAB — PROTIME-INR
INR: 1.27 (ref 0.00–1.49)
PROTHROMBIN TIME: 16 s — AB (ref 11.6–15.2)

## 2015-12-25 MED ORDER — MIDAZOLAM HCL 2 MG/2ML IJ SOLN
INTRAMUSCULAR | Status: AC
  Filled 2015-12-25: qty 2

## 2015-12-25 MED ORDER — RIVAROXABAN 20 MG PO TABS
20.0000 mg | ORAL_TABLET | Freq: Every day | ORAL | Status: DC
  Administered 2015-12-26 – 2015-12-29 (×4): 20 mg via ORAL
  Filled 2015-12-25 (×4): qty 1

## 2015-12-25 MED ORDER — LIDOCAINE HCL 1 % IJ SOLN
INTRAMUSCULAR | Status: AC
  Administered 2015-12-25: 10 mL
  Filled 2015-12-25: qty 20

## 2015-12-25 MED ORDER — CEFAZOLIN SODIUM-DEXTROSE 2-4 GM/100ML-% IV SOLN
2.0000 g | Freq: Once | INTRAVENOUS | Status: AC
  Administered 2015-12-25: 2 g via INTRAVENOUS
  Filled 2015-12-25: qty 100

## 2015-12-25 MED ORDER — IOPAMIDOL (ISOVUE-300) INJECTION 61%
INTRAVENOUS | Status: AC
  Administered 2015-12-25: 15 mL
  Filled 2015-12-25: qty 50

## 2015-12-25 MED ORDER — FENTANYL CITRATE (PF) 100 MCG/2ML IJ SOLN
INTRAMUSCULAR | Status: AC
  Filled 2015-12-25: qty 2

## 2015-12-25 MED ORDER — FENTANYL CITRATE (PF) 100 MCG/2ML IJ SOLN
INTRAMUSCULAR | Status: AC | PRN
  Administered 2015-12-25: 50 ug via INTRAVENOUS
  Administered 2015-12-25: 25 ug via INTRAVENOUS

## 2015-12-25 MED ORDER — CEFAZOLIN SODIUM-DEXTROSE 2-4 GM/100ML-% IV SOLN
INTRAVENOUS | Status: AC
  Filled 2015-12-25: qty 100

## 2015-12-25 MED ORDER — MIDAZOLAM HCL 2 MG/2ML IJ SOLN
INTRAMUSCULAR | Status: AC | PRN
  Administered 2015-12-25: 0.5 mg via INTRAVENOUS
  Administered 2015-12-25: 1 mg via INTRAVENOUS

## 2015-12-25 NOTE — Sedation Documentation (Signed)
Vital signs stable. 

## 2015-12-25 NOTE — Sedation Documentation (Signed)
Patient is resting comfortably. 

## 2015-12-25 NOTE — Progress Notes (Signed)
Nutrition Follow-up  DOCUMENTATION CODES:   Not applicable  INTERVENTION:   -Once diet is advanced, resume: Ensure Enlive po BID, each supplement provides 350 kcal and 20 grams of protein  NUTRITION DIAGNOSIS:   Inadequate oral intake related to poor appetite as evidenced by meal completion < 50%.  Progressing  GOAL:   Patient will meet greater than or equal to 90% of their needs  Progressing  MONITOR:   PO intake, Supplement acceptance, Labs, Weight trends, Skin, I & O's  REASON FOR ASSESSMENT:   Low [place]    ASSESSMENT:   55 year old Female, chronically ill looking, with history of MS, decubitus ulcers, documented AKI and CKD, UTI, DVT, CVA and malnutrition. Patient lives at home, but has neighbor and Home Health checking on her.   Pt NPO and down in radiology at time visit. Pt to undergo lt percutaneous nephrostomy due to lt obstructive nephrolithiasis.   Pt followed by SLP; most recent diet order is dysphagia 2 with thin liquids. Meal completion is variable; PO: 25-100%, averaging 50% of meal intake. Pt with Ensure supplements ordered, which she accepts well.   Labs reviewed: CBGS: 108-137.   Diet Order:  Diet NPO time specified Except for: Sips with Meds  Skin:  Reviewed, no issues  Last BM:  12/24/15  Height:   Ht Readings from Last 1 Encounters:  12/18/15 5\' 3"  (1.6 m)    Weight:   Wt Readings from Last 1 Encounters:  12/18/15 140 lb 3.4 oz (63.6 kg)    Ideal Body Weight:  52.3 kg  BMI:  Body mass index is 24.84 kg/(m^2).  Estimated Nutritional Needs:   Kcal:  1500-1700  Protein:  70-85 grams  Fluid:  1.5-1.7 L  EDUCATION NEEDS:   Education needs addressed  Estell Puccini A. Jimmye Norman, RD, LDN, CDE Pager: [phone] After hours Pager: [phone]

## 2015-12-25 NOTE — Progress Notes (Signed)
[facility]    Date of Admission:  12/18/2015   Total of 7 days on antibiotics: Vancomycin and ceftriaxone Principal Problem:   Catheter-associated urinary tract infection (HCC) Active Problems:   Bacteremia due to coagulase-negative Staphylococcus   Gram-negative bacteremia (HCC)   Left nephrolithiasis   SIRS (systemic inflammatory response syndrome) ([place])   Palliative care encounter   Partial small bowel obstruction ([place])   . baclofen  10 mg Oral TID  . ceFAZolin      .  ceFAZolin (ANCEF) IV  2 g Intravenous Once  . cefTRIAXone (ROCEPHIN)  IV  2 g Intravenous Q24H  . darifenacin  7.5 mg Oral Daily  . famotidine  20 mg Oral Daily  . feeding supplement (ENSURE ENLIVE)  237 mL Oral BID BM  . fentaNYL      . gabapentin  100 mg Oral QHS  . insulin aspart  0-9 Units Subcutaneous Q4H  . iopamidol      . lidocaine      . midazolam      . multivitamin with minerals  1 tablet Oral Daily  . polyethylene glycol  17 g Oral Daily  . pravastatin  20 mg Oral q1800  . senna-docusate  1 tablet Oral BID  . vancomycin  750 mg Intravenous Q12H  . vitamin C  500 mg Oral Daily    SUBJECTIVE: [name] feels about the same this morning and she is NPO anticipating percutaneous nephrostomy. She has mild abdominal pain.  Review of Systems: Review of Systems  Gastrointestinal: Positive for abdominal pain. Negative for nausea and vomiting.  Genitourinary: Negative for hematuria.  Skin: Negative for rash.  Neurological: Positive for weakness.    Past Medical History  Diagnosis Date  . Diabetes mellitus without complication ([place])   . Multiple sclerosis diagnosed 2002-not on Therapy any longer 06/26/2012  . Stage 4 decubitus ulcer ([place]) 07/05/2015  . Acute renal failure superimposed on stage 3 chronic kidney disease ([place]) 07/21/2015  . Malnutrition of moderate degree 07/21/2015   . UTI (lower urinary tract infection)   . DVT (deep venous thrombosis) ([place])   . Stroke [facility])     Social History  Substance Use Topics  . Smoking status: Former Smoker -- 1.00 packs/day for 20 years    Types: Cigarettes    Quit date: 11/19/2014  . Smokeless tobacco: Never Used  . Alcohol Use: 3.0 oz/week    5 Glasses of wine per week    Family History  Problem Relation Age of Onset  . Diabetes Mother   . Diabetes Father   . Diabetes Sister   . Scoliosis Sister   . Alzheimer's disease Mother    No Known Allergies  OBJECTIVE: Filed Vitals:   12/25/15 1158 12/25/15 1202 12/25/15 1206 12/25/15 1210  BP: 131/79 128/73 129/78 130/79  Pulse: 101 102 103 98  Temp:      TempSrc:      Resp: 16 17 16 15   Height:      Weight:      SpO2: 100% 100% 100% 100%   Body mass index is 24.84 kg/(m^2).  Physical Exam GENERAL- chronically ill appearing, no acute distress CARDIAC- Systolic murmur over left upper sternal border RESP- CTAB, poor inspiratory effort ABDOMEN- Mild diffuse tenderness to palpation, bowel sounds present throughout [facility]- Flat affect   Lab Results Lab Results  Component Value Date   WBC 21.5* 12/25/2015   HGB 8.3* 12/25/2015  HCT 25.3* 12/25/2015   MCV 83.5 12/25/2015   PLT 522* 12/25/2015    Lab Results  Component Value Date   CREATININE 0.52 12/25/2015   BUN <5* 12/25/2015   NA 137 12/25/2015   K 4.2 12/25/2015   CL 107 12/25/2015   CO2 26 12/25/2015    Lab Results  Component Value Date   ALT 7* 12/19/2015   AST 12* 12/19/2015   ALKPHOS 50 12/19/2015   BILITOT 0.5 12/19/2015     Microbiology: Recent Results (from the past 240 hour(s))  Blood culture (routine x 2)     Status: Abnormal   Collection Time: 12/18/15 10:35 AM  Result Value Ref Range Status   Specimen Description BLOOD LEFT HAND  Final   Special Requests BOTTLES DRAWN AEROBIC AND ANAEROBIC  5CC  Final   Culture  Setup Time   Final    GRAM POSITIVE COCCI IN CLUSTERS IN  BOTH AEROBIC AND ANAEROBIC BOTTLES CRITICAL RESULT CALLED TO, READ BACK BY AND VERIFIED WITH: J MARKEL,PHARMD AT [id number] 12/19/15 BY L BENFIELD    Culture STAPHYLOCOCCUS SPECIES (COAGULASE NEGATIVE) (A)  Final   Report Status 12/21/2015 FINAL  Final   Organism ID, Bacteria STAPHYLOCOCCUS SPECIES (COAGULASE NEGATIVE)  Final      Susceptibility   Staphylococcus species (coagulase negative) - MIC*    CIPROFLOXACIN 1 SENSITIVE Sensitive     ERYTHROMYCIN <=0.25 SENSITIVE Sensitive     GENTAMICIN <=0.5 SENSITIVE Sensitive     OXACILLIN 0.5 RESISTANT Resistant     TETRACYCLINE <=1 SENSITIVE Sensitive     VANCOMYCIN 1 SENSITIVE Sensitive     TRIMETH/SULFA 20 SENSITIVE Sensitive     CLINDAMYCIN <=0.25 SENSITIVE Sensitive     RIFAMPIN <=0.5 SENSITIVE Sensitive     Inducible Clindamycin NEGATIVE Sensitive     * STAPHYLOCOCCUS SPECIES (COAGULASE NEGATIVE)  Blood Culture ID Panel (Reflexed)     Status: Abnormal   Collection Time: 12/18/15 10:35 AM  Result Value Ref Range Status   Enterococcus species NOT DETECTED NOT DETECTED Final   Vancomycin resistance NOT DETECTED NOT DETECTED Final   Listeria monocytogenes NOT DETECTED NOT DETECTED Final   Staphylococcus species DETECTED (A) NOT DETECTED Final    Comment: CRITICAL RESULT CALLED TO, READ BACK BY AND VERIFIED WITH: J MARKEL,PHARMD AT 1417 12/19/15 BY L BENFIELD    Staphylococcus aureus NOT DETECTED NOT DETECTED Final   Methicillin resistance DETECTED (A) NOT DETECTED Final    Comment: CRITICAL RESULT CALLED TO, READ BACK BY AND VERIFIED WITH: J MARKEL,PHARMD AT 1417 12/19/15 BY L BENFIELD    Streptococcus species NOT DETECTED NOT DETECTED Final   Streptococcus agalactiae NOT DETECTED NOT DETECTED Final   Streptococcus pneumoniae NOT DETECTED NOT DETECTED Final   Streptococcus pyogenes NOT DETECTED NOT DETECTED Final   Acinetobacter baumannii NOT DETECTED NOT DETECTED Final   Enterobacteriaceae species NOT DETECTED NOT DETECTED Final    Enterobacter cloacae complex NOT DETECTED NOT DETECTED Final   Escherichia coli NOT DETECTED NOT DETECTED Final   Klebsiella oxytoca NOT DETECTED NOT DETECTED Final   Klebsiella pneumoniae NOT DETECTED NOT DETECTED Final   Proteus species NOT DETECTED NOT DETECTED Final   Serratia marcescens NOT DETECTED NOT DETECTED Final   Carbapenem resistance NOT DETECTED NOT DETECTED Final   Haemophilus influenzae NOT DETECTED NOT DETECTED Final   Neisseria meningitidis NOT DETECTED NOT DETECTED Final   Pseudomonas aeruginosa NOT DETECTED NOT DETECTED Final   Candida albicans NOT DETECTED NOT DETECTED Final   Candida glabrata NOT DETECTED  NOT DETECTED Final   Candida krusei NOT DETECTED NOT DETECTED Final   Candida parapsilosis NOT DETECTED NOT DETECTED Final   Candida tropicalis NOT DETECTED NOT DETECTED Final  Urine culture     Status: Abnormal   Collection Time: 12/18/15 11:18 AM  Result Value Ref Range Status   Specimen Description URINE, CATHETERIZED  Final   Special Requests Normal  Final   Culture >=100,000 COLONIES/mL PROVIDENCIA STUARTII (A)  Final   Report Status 12/20/2015 FINAL  Final   Organism ID, Bacteria PROVIDENCIA STUARTII (A)  Final      Susceptibility   Providencia stuartii - MIC*    AMPICILLIN 16 RESISTANT Resistant     CEFAZOLIN >=64 RESISTANT Resistant     CEFTRIAXONE <=1 SENSITIVE Sensitive     CIPROFLOXACIN >=4 RESISTANT Resistant     GENTAMICIN >=16 RESISTANT Resistant     IMIPENEM 2 SENSITIVE Sensitive     NITROFURANTOIN 128 RESISTANT Resistant     TRIMETH/SULFA 40 SENSITIVE Sensitive     AMPICILLIN/SULBACTAM 8 SENSITIVE Sensitive     PIP/TAZO <=4 SENSITIVE Sensitive     * >=100,000 COLONIES/mL PROVIDENCIA STUARTII  Blood culture (routine x 2)     Status: Abnormal   Collection Time: 12/18/15 12:00 PM  Result Value Ref Range Status   Specimen Description BLOOD RIGHT HAND  Final   Special Requests IN PEDIATRIC BOTTLE  3CC  Final   Culture  Setup Time   Final     GRAM POSITIVE COCCI IN CLUSTERS AEROBIC BOTTLE ONLY CRITICAL RESULT CALLED TO, READ BACK BY AND VERIFIED WITH: J MARKEL,PHARMD AT 1417 12/19/15 BY L BENFIELD    Culture STAPHYLOCOCCUS SPECIES (COAGULASE NEGATIVE) (A)  Final   Report Status 12/23/2015 FINAL  Final   Organism ID, Bacteria STAPHYLOCOCCUS SPECIES (COAGULASE NEGATIVE)  Final      Susceptibility   Staphylococcus species (coagulase negative) - MIC*    CIPROFLOXACIN <=0.5 SENSITIVE Sensitive     ERYTHROMYCIN <=0.25 SENSITIVE Sensitive     GENTAMICIN <=0.5 SENSITIVE Sensitive     OXACILLIN <=0.25 SENSITIVE Sensitive     TETRACYCLINE <=1 SENSITIVE Sensitive     VANCOMYCIN <=0.5 SENSITIVE Sensitive     TRIMETH/SULFA 20 SENSITIVE Sensitive     CLINDAMYCIN <=0.25 SENSITIVE Sensitive     RIFAMPIN <=0.5 SENSITIVE Sensitive     Inducible Clindamycin NEGATIVE Sensitive     * STAPHYLOCOCCUS SPECIES (COAGULASE NEGATIVE)  MRSA PCR Screening     Status: None   Collection Time: 12/18/15  3:00 PM  Result Value Ref Range Status   MRSA by PCR NEGATIVE NEGATIVE Final    Comment:        The GeneXpert MRSA Assay (FDA approved for NASAL specimens only), is one component of a comprehensive MRSA colonization surveillance program. It is not intended to diagnose MRSA infection nor to guide or monitor treatment for MRSA infections.   Culture, blood (routine x 2)     Status: Abnormal   Collection Time: 12/18/15  7:57 PM  Result Value Ref Range Status   Specimen Description BLOOD LEFT ANTECUBITAL  Final   Special Requests IN PEDIATRIC BOTTLE  3CC  Final   Culture  Setup Time   Final    GRAM NEGATIVE RODS AEROBIC BOTTLE ONLY CRITICAL RESULT CALLED TO, READ BACK BY AND VERIFIED WITH: J [facility] 12/19/15 @ 4 M VESTAL    Culture PROVIDENCIA STUARTII (A)  Final   Report Status 12/21/2015 FINAL  Final   Organism ID, Bacteria PROVIDENCIA STUARTII  Final  Susceptibility   Providencia stuartii - MIC*    AMPICILLIN 16 RESISTANT Resistant      CEFAZOLIN >=64 RESISTANT Resistant     CEFEPIME <=1 SENSITIVE Sensitive     CEFTAZIDIME <=1 SENSITIVE Sensitive     CEFTRIAXONE <=1 SENSITIVE Sensitive     CIPROFLOXACIN >=4 RESISTANT Resistant     GENTAMICIN 8 RESISTANT Resistant     IMIPENEM 1 SENSITIVE Sensitive     TRIMETH/SULFA <=20 SENSITIVE Sensitive     AMPICILLIN/SULBACTAM 4 SENSITIVE Sensitive     PIP/TAZO <=4 SENSITIVE Sensitive     * PROVIDENCIA STUARTII  Blood Culture ID Panel (Reflexed)     Status: None   Collection Time: 12/18/15  7:57 PM  Result Value Ref Range Status   Enterococcus species NOT DETECTED NOT DETECTED Final   Vancomycin resistance NOT DETECTED NOT DETECTED Final   Listeria monocytogenes NOT DETECTED NOT DETECTED Final   Staphylococcus species NOT DETECTED NOT DETECTED Final   Staphylococcus aureus NOT DETECTED NOT DETECTED Final   Methicillin resistance NOT DETECTED NOT DETECTED Final   Streptococcus species NOT DETECTED NOT DETECTED Final   Streptococcus agalactiae NOT DETECTED NOT DETECTED Final   Streptococcus pneumoniae NOT DETECTED NOT DETECTED Final   Streptococcus pyogenes NOT DETECTED NOT DETECTED Final   Acinetobacter baumannii NOT DETECTED NOT DETECTED Final   Enterobacteriaceae species NOT DETECTED NOT DETECTED Final   Enterobacter cloacae complex NOT DETECTED NOT DETECTED Final   Escherichia coli NOT DETECTED NOT DETECTED Final   Klebsiella oxytoca NOT DETECTED NOT DETECTED Final   Klebsiella pneumoniae NOT DETECTED NOT DETECTED Final   Proteus species NOT DETECTED NOT DETECTED Final   Serratia marcescens NOT DETECTED NOT DETECTED Final   Carbapenem resistance NOT DETECTED NOT DETECTED Final   Haemophilus influenzae NOT DETECTED NOT DETECTED Final   Neisseria meningitidis NOT DETECTED NOT DETECTED Final   Pseudomonas aeruginosa NOT DETECTED NOT DETECTED Final   Candida albicans NOT DETECTED NOT DETECTED Final   Candida glabrata NOT DETECTED NOT DETECTED Final   Candida krusei NOT  DETECTED NOT DETECTED Final   Candida parapsilosis NOT DETECTED NOT DETECTED Final   Candida tropicalis NOT DETECTED NOT DETECTED Final  Culture, blood (routine x 2)     Status: Abnormal   Collection Time: 12/18/15  8:21 PM  Result Value Ref Range Status   Specimen Description BLOOD RIGHT ANTECUBITAL  Final   Special Requests IN PEDIATRIC BOTTLE  1CC  Final   Culture  Setup Time   Final    GRAM POSITIVE COCCI IN CLUSTERS AEROBIC BOTTLE ONLY CRITICAL RESULT CALLED TO, READ BACK BY AND VERIFIED WITH: J MARKEL 12/19/15 @ 1607 M VESTAL    Culture STAPHYLOCOCCUS SPECIES (COAGULASE NEGATIVE) (A)  Final   Report Status 12/22/2015 FINAL  Final   Organism ID, Bacteria STAPHYLOCOCCUS SPECIES (COAGULASE NEGATIVE)  Final      Susceptibility   Staphylococcus species (coagulase negative) - MIC*    CIPROFLOXACIN >=8 RESISTANT Resistant     ERYTHROMYCIN 0.5 SENSITIVE Sensitive     GENTAMICIN <=0.5 SENSITIVE Sensitive     OXACILLIN >=4 RESISTANT Resistant     TETRACYCLINE <=1 SENSITIVE Sensitive     VANCOMYCIN 1 SENSITIVE Sensitive     TRIMETH/SULFA 80 RESISTANT Resistant     CLINDAMYCIN <=0.25 SENSITIVE Sensitive     RIFAMPIN <=0.5 SENSITIVE Sensitive     Inducible Clindamycin NEGATIVE Sensitive     * STAPHYLOCOCCUS SPECIES (COAGULASE NEGATIVE)  Culture, blood (Routine X 2) w Reflex to ID Panel  Status: None (Preliminary result)   Collection Time: 12/22/15  4:00 PM  Result Value Ref Range Status   Specimen Description BLOOD LEFT ANTECUBITAL  Final   Special Requests BOTTLES DRAWN AEROBIC AND ANAEROBIC 10CC  Final   Culture NO GROWTH 3 DAYS  Final   Report Status PENDING  Incomplete  Culture, blood (Routine X 2) w Reflex to ID Panel     Status: None (Preliminary result)   Collection Time: 12/22/15  4:15 PM  Result Value Ref Range Status   Specimen Description BLOOD LEFT FOREARM  Final   Special Requests BOTTLES DRAWN AEROBIC AND ANAEROBIC 10CC  Final   Culture NO GROWTH 2 DAYS  Final    Report Status PENDING  Incomplete     ASSESSMENT: Complicated polymicrobial UTI complicated by bacteremia: [name] is responding to treatment and is now afebrile for several days. She does have elevation of leukocytosis and thrombocytosis with continued abdominal and left sided pain. Due to the involvement of large left renal stones in her infection I would recommend continuing antibiotics until completing lithotomy or lithotripsy if these are feasible. In any case at least 14 days of therapy for this complicated infection are warranted and her bacteremia is cleared so she would benefit from a PICC line for continuing treatment.  PLAN: 1. Continue vancomycin and ceftriaxone until removal of obstructing left renal stones if possible, or at least 14 days (Through 7/21) 2. Recommend PICC line placement  Collier Salina, MD PGY-II Internal Medicine Resident Pager# [phone] 12/25/2015, 12:13 PM

## 2015-12-25 NOTE — Progress Notes (Signed)
Pharmacy:  D/w Dr Kathlene Cote.  Ok to resume xarelto for hx DVT on 7/15 with supper  Eudelia Bunch, Pharm.D. QP:[phone] 12/25/2015 1:55 PM

## 2015-12-25 NOTE — Procedures (Signed)
Interventional Radiology Procedure Note  Procedure:  Left percutaneous nephrostomy  Complications: None  Estimated Blood Loss: < 10 mL  Findings:  Large left renal cyst initially stuck with 21 G needle.  Opacified with contrast and does not communicate w/ collecting system. Lower pole and interpolar collecting system splayed by large cyst and not dilated. Only mild dilatation of upper pole collecting system.  After access of upper pole, contrast injection shows mild hydro with flow around a large pelvic calculus into ureter.   10 Fr PCN formed in renal pelvis and upper infundibulum and connected to gravity bag.  Venetia Night. Kathlene Cote, M.D Pager:  [phone]

## 2015-12-25 NOTE — Progress Notes (Signed)
Patient ID: [name], female   DOB: [birth year], 55 y.o.   MRN: [id number]    PROGRESS NOTE    RONYA GILCREST  RFF:[id number] DOB: Nov 03, 1960 DOA: 12/18/2015  PCP: unknown    Brief Narrative:  55 year old Female, chronically ill looking, with history of MS, decubitus ulcers, documented AKI and CKD, DVT, CVA and malnutrition. Patient lives at home, but has neighbor and Home Health checking on her. On presentation to the hospital, UA suggestive of UTI, and CBC reveals elevated WBC with bandemia. Lactic acid is elevated, and Scr has risen from 0.54 to 3.32. Patient was on Metformin, bactrim, PRN lasix and Xarelto 20 MG po once daily prior to current admission.   Of note, pt was just recently discharged on November 18, 2015 after being treated for Sepsis due to Providencia and Coag Neg Staph Bacteremia and Pyelonephritis that was thought to be most likely from chronic indwelling catheter. Pt was discharged on Bactrim to complete therapy for 14 days upon discharge. During last admission, [facility] team evaluated pt and noted several superficial dime sized decubitus ulcers on bilat buttocks and stage 4 sacral wounds that are now mostly healed w/ pink dry scar tissue with small remaining pressure injury in the center of each location; to sacrum, left and right ischium, each site is approx .1X.1X.1cm, pink and dry.  7/14  Pt underwent left percutaneous nephrostomy tube and ordered a PICC LINE to be placed.   Assessment & Plan:   Active Problems: Sepsis secondary to UTI from indwelling foley cath and left pyelonephritis, Staph and providencia stuarti bacteremia (3/4 blood cultures positive for staph), pt with neurogenic bladder  - pt met criteria for sepsis on admission with HR > 90 and WBC 14 K, source likely UTI, acute left pyelonephritis, Bacteremia, elevated lactic acid . - urine culture from 7/7 with providencia stuarti  and blood cultures from 7/7 with coag negative staph (3/4) and providencia stuarti  -  unclear source, pt has chronic foley cath - continue vanc and Rocephin, the duration as per ID recommendations. To get atleast 14 days of IV antibiotics and plan for PICC line placement.  - ID consulted and assistance is appreciated   Acute metabolic encephalopathy - secondary to the above imposed on dehydration, hypernatremia  -  Today she is more alert, and communicating.    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 Normal renal parameters this am.   Distended abd, adynamic Ileus, SBO - ? Adhesions and constipation - surgery consulted and recommended started clears and advance as tolerated.  - no indication for surgical intervention at this time .  Left-sided obstructive nephrolithiasis - partially obstructive left-sided nephrolithiasis, with stones at the ureteropelvic junction and in the distal ureter just above the left ureterovesical junction - IR consult placed as recommended by urologist - pt underwent  left percutaneous nephrostomy tube placement today, Without any complications.  - Continue IV antibiotics per ID for sepsis to complete the 14 day course.  - appreciate urology team following   Hypokalemia - supplemented and WNL   Hypernatremia - Na is now WNL - d/c dextrose fluids.   Anemia of chronic disease  Thought her hemoglobin on admisson was 10, probably a dehydrated sample.  Baseline hemoglobin is around 8,  and it has been around 8, the last few values.   Hx of DVT  - xarelto was held initially and to be resumed tonight.   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    MS - PT eval requested  - pt has very limited motion in upper  and lower extremities but has so far refused SNF  Leukocytosis: Slight worsening today, she has remained afebrile.  Monitor with a repeat CBC in am.    DVT prophylaxis: on Xarelto , resume tonight.  Code Status: Full Family Communication: No family at bedside, I asked pt to have RN page me once family arrives but I have been waiting for family to call for several days Disposition Plan: pending PT consult.   Consultants:   PCT  Surgery  ID  Urology   IR  SLP - dys II diet  Procedures:   Left nephrostromy tube placement by ID on 7/14.   Antimicrobials:   Zoxyn 7/7 --> changed to Rocephin 7/9 -->   Vanc 7/7 -->  Subjective: Reports feeling sore all over and more in the back.   Objective: Filed Vitals:   12/25/15 1210 12/25/15 1216 12/25/15 1317 12/25/15 1338  BP: 130/79 133/76 134/67 137/76  Pulse: 98 97 87 86  Temp:   98.9 F (37.2 C) 98.6 F (37 C)  TempSrc:      Resp: '15 18 17 20  ' Height:      Weight:      SpO2: 100% 100% 95% 98%    Intake/Output Summary (Last 24 hours) at 12/25/15 1503 Last data filed at 12/25/15 0817  Gross per 24 hour  Intake 1920.83 ml  Output   2500 ml  Net -579.17 ml   Filed Weights   12/18/15 1031 12/18/15 1448  Weight: 77.111 kg (170 lb) 63.6 kg (140 lb 3.4 oz)    Examination:  General exam: Appears more alert, flat affect , reports feeling sore all over.  Respiratory system: Respiratory effort normal. Diminished breath sounds at bases , no wheezing or rhonchi.  Cardiovascular system: RRR. No JVD, murmurs, rubs, gallops or clicks. No pedal edema. Gastrointestinal system: Abdomen is distended, tympanic, slightly tender in epigastric area, diminished bowel sounds , left flank tenderness.  Central nervous system: Alert strength in upper and lower extremities 2/5, sensation diminished in upper and lower extremities. Appears to be oriented.   Data Reviewed: I have personally reviewed following labs and imaging  studies  CBC:  Recent Labs Lab 12/20/15 0646 12/21/15 0559 12/22/15 0735 12/23/15 0219 12/25/15 0617  WBC 17.4* 15.9* 14.8* 14.7* 21.5*  HGB 10.4* 8.2* 8.1* 8.4* 8.3*  HCT 33.1* 25.8* 24.6* 26.2* 25.3*  MCV 86.6 83.8 83.7 83.4 83.5  PLT 361 359 353 382 030*   Basic Metabolic Panel:  Recent Labs Lab 12/20/15 0646 12/21/15 0559 12/22/15 0735 12/23/15 0219 12/25/15 0617  NA 149* 143 139 137 137  K 3.1* 2.9* 4.3 4.0 4.2  CL 120* 115* 108 107 107  CO2 21* 21* '26 27 26  ' GLUCOSE 112* 184* 152* 167* 130*  BUN 29* '19 10 6 ' <5*  CREATININE 0.84 0.67 0.59 0.51 0.52  CALCIUM 9.8 9.1 9.0 9.1 9.6   Liver Function Tests:  Recent Labs Lab 12/19/15 0744  AST 12*  ALT 7*  ALKPHOS 50  BILITOT 0.5  PROT 6.6  ALBUMIN 2.3*   CBG:  Recent Labs Lab 12/24/15 2155 12/25/15 0226 12/25/15 0546 12/25/15 0815 12/25/15 1339  GLUCAP 187* 164* 133* 137* 108*   Urine analysis:    Component Value Date/Time   COLORURINE YELLOW 12/18/2015 1118   APPEARANCEUR TURBID* 12/18/2015 1118   LABSPEC 1.014 12/18/2015 1118   PHURINE 8.0 12/18/2015 1118   GLUCOSEU NEGATIVE 12/18/2015 1118   HGBUR MODERATE* 12/18/2015 1118   BILIRUBINUR NEGATIVE 12/18/2015 1118   KETONESUR NEGATIVE 12/18/2015 1118   PROTEINUR 100* 12/18/2015 1118   UROBILINOGEN 0.2 12/20/2014 1851   NITRITE NEGATIVE 12/18/2015 1118   LEUKOCYTESUR LARGE* 12/18/2015 1118    Recent Results (from the past 240 hour(s))  Blood culture (routine x 2)     Status: Abnormal   Collection Time: 12/18/15 10:35 AM  Result Value Ref Range Status   Specimen Description BLOOD LEFT HAND  Final   Special Requests BOTTLES DRAWN AEROBIC AND ANAEROBIC  5CC  Final   Culture  Setup Time   Final    GRAM POSITIVE COCCI IN CLUSTERS IN BOTH AEROBIC AND ANAEROBIC BOTTLES CRITICAL RESULT CALLED TO, READ BACK BY AND VERIFIED WITH: J MARKEL,PHARMD AT 2992 12/19/15 BY L BENFIELD    Culture STAPHYLOCOCCUS SPECIES (COAGULASE NEGATIVE) (A)  Final    Report Status 12/21/2015 FINAL  Final   Organism ID, Bacteria STAPHYLOCOCCUS SPECIES (COAGULASE NEGATIVE)  Final      Susceptibility   Staphylococcus species (coagulase negative) - MIC*    CIPROFLOXACIN 1 SENSITIVE Sensitive     ERYTHROMYCIN <=0.25 SENSITIVE Sensitive     GENTAMICIN <=0.5 SENSITIVE Sensitive     OXACILLIN 0.5 RESISTANT Resistant     TETRACYCLINE <=1 SENSITIVE Sensitive     VANCOMYCIN 1 SENSITIVE Sensitive     TRIMETH/SULFA 20 SENSITIVE Sensitive     CLINDAMYCIN <=0.25 SENSITIVE Sensitive     RIFAMPIN <=0.5 SENSITIVE Sensitive     Inducible Clindamycin NEGATIVE Sensitive     * STAPHYLOCOCCUS SPECIES (COAGULASE NEGATIVE)  Blood Culture ID Panel (Reflexed)     Status: Abnormal   Collection Time: 12/18/15 10:35 AM  Result Value Ref Range Status   Enterococcus species NOT DETECTED NOT DETECTED Final   Vancomycin resistance NOT DETECTED NOT DETECTED Final   Listeria monocytogenes NOT DETECTED NOT DETECTED Final   Staphylococcus species DETECTED (A) NOT DETECTED Final    Comment: CRITICAL RESULT CALLED TO, READ BACK BY AND VERIFIED WITH: J MARKEL,PHARMD AT 1417 12/19/15 BY L BENFIELD    Staphylococcus aureus NOT DETECTED NOT DETECTED Final   Methicillin resistance DETECTED (A) NOT DETECTED Final    Comment: CRITICAL RESULT CALLED TO, READ BACK BY AND VERIFIED WITH: J MARKEL,PHARMD AT 1417 12/19/15 BY L BENFIELD    Streptococcus species NOT DETECTED NOT DETECTED Final   Streptococcus agalactiae NOT DETECTED NOT DETECTED Final   Streptococcus pneumoniae NOT DETECTED NOT DETECTED Final   Streptococcus pyogenes NOT DETECTED NOT DETECTED Final   Acinetobacter baumannii NOT DETECTED NOT DETECTED Final   Enterobacteriaceae species NOT DETECTED NOT DETECTED Final   Enterobacter cloacae complex NOT DETECTED NOT DETECTED Final   Escherichia coli NOT DETECTED NOT DETECTED Final   Klebsiella oxytoca NOT DETECTED NOT DETECTED Final   Klebsiella pneumoniae NOT DETECTED NOT DETECTED  Final   Proteus species NOT DETECTED NOT DETECTED Final   Serratia marcescens NOT DETECTED NOT DETECTED Final   Carbapenem resistance NOT DETECTED NOT DETECTED Final   Haemophilus influenzae NOT DETECTED NOT DETECTED Final   Neisseria meningitidis NOT DETECTED NOT DETECTED  Final   Pseudomonas aeruginosa NOT DETECTED NOT DETECTED Final   Candida albicans NOT DETECTED NOT DETECTED Final   Candida glabrata NOT DETECTED NOT DETECTED Final   Candida krusei NOT DETECTED NOT DETECTED Final   Candida parapsilosis NOT DETECTED NOT DETECTED Final   Candida tropicalis NOT DETECTED NOT DETECTED Final  Urine culture     Status: Abnormal   Collection Time: 12/18/15 11:18 AM  Result Value Ref Range Status   Specimen Description URINE, CATHETERIZED  Final   Special Requests Normal  Final   Culture >=100,000 COLONIES/mL PROVIDENCIA STUARTII (A)  Final   Report Status 12/20/2015 FINAL  Final   Organism ID, Bacteria PROVIDENCIA STUARTII (A)  Final      Susceptibility   Providencia stuartii - MIC*    AMPICILLIN 16 RESISTANT Resistant     CEFAZOLIN >=64 RESISTANT Resistant     CEFTRIAXONE <=1 SENSITIVE Sensitive     CIPROFLOXACIN >=4 RESISTANT Resistant     GENTAMICIN >=16 RESISTANT Resistant     IMIPENEM 2 SENSITIVE Sensitive     NITROFURANTOIN 128 RESISTANT Resistant     TRIMETH/SULFA 40 SENSITIVE Sensitive     AMPICILLIN/SULBACTAM 8 SENSITIVE Sensitive     PIP/TAZO <=4 SENSITIVE Sensitive     * >=100,000 COLONIES/mL PROVIDENCIA STUARTII  Blood culture (routine x 2)     Status: Abnormal   Collection Time: 12/18/15 12:00 PM  Result Value Ref Range Status   Specimen Description BLOOD RIGHT HAND  Final   Special Requests IN PEDIATRIC BOTTLE  3CC  Final   Culture  Setup Time   Final    GRAM POSITIVE COCCI IN CLUSTERS AEROBIC BOTTLE ONLY CRITICAL RESULT CALLED TO, READ BACK BY AND VERIFIED WITH: J MARKEL,PHARMD AT 1417 12/19/15 BY L BENFIELD    Culture STAPHYLOCOCCUS SPECIES (COAGULASE NEGATIVE) (A)   Final   Report Status 12/23/2015 FINAL  Final   Organism ID, Bacteria STAPHYLOCOCCUS SPECIES (COAGULASE NEGATIVE)  Final      Susceptibility   Staphylococcus species (coagulase negative) - MIC*    CIPROFLOXACIN <=0.5 SENSITIVE Sensitive     ERYTHROMYCIN <=0.25 SENSITIVE Sensitive     GENTAMICIN <=0.5 SENSITIVE Sensitive     OXACILLIN <=0.25 SENSITIVE Sensitive     TETRACYCLINE <=1 SENSITIVE Sensitive     VANCOMYCIN <=0.5 SENSITIVE Sensitive     TRIMETH/SULFA 20 SENSITIVE Sensitive     CLINDAMYCIN <=0.25 SENSITIVE Sensitive     RIFAMPIN <=0.5 SENSITIVE Sensitive     Inducible Clindamycin NEGATIVE Sensitive     * STAPHYLOCOCCUS SPECIES (COAGULASE NEGATIVE)  MRSA PCR Screening     Status: None   Collection Time: 12/18/15  3:00 PM  Result Value Ref Range Status   MRSA by PCR NEGATIVE NEGATIVE Final    Comment:        The GeneXpert MRSA Assay (FDA approved for NASAL specimens only), is one component of a comprehensive MRSA colonization surveillance program. It is not intended to diagnose MRSA infection nor to guide or monitor treatment for MRSA infections.   Culture, blood (routine x 2)     Status: Abnormal   Collection Time: 12/18/15  7:57 PM  Result Value Ref Range Status   Specimen Description BLOOD LEFT ANTECUBITAL  Final   Special Requests IN PEDIATRIC BOTTLE  3CC  Final   Culture  Setup Time   Final    GRAM NEGATIVE RODS AEROBIC BOTTLE ONLY CRITICAL RESULT CALLED TO, READ BACK BY AND VERIFIED WITH: J MARKEL 12/19/15 @ [place]  Culture PROVIDENCIA STUARTII (A)  Final   Report Status 12/21/2015 FINAL  Final   Organism ID, Bacteria PROVIDENCIA STUARTII  Final      Susceptibility   Providencia stuartii - MIC*    AMPICILLIN 16 RESISTANT Resistant     CEFAZOLIN >=64 RESISTANT Resistant     CEFEPIME <=1 SENSITIVE Sensitive     CEFTAZIDIME <=1 SENSITIVE Sensitive     CEFTRIAXONE <=1 SENSITIVE Sensitive     CIPROFLOXACIN >=4 RESISTANT Resistant     GENTAMICIN 8  RESISTANT Resistant     IMIPENEM 1 SENSITIVE Sensitive     TRIMETH/SULFA <=20 SENSITIVE Sensitive     AMPICILLIN/SULBACTAM 4 SENSITIVE Sensitive     PIP/TAZO <=4 SENSITIVE Sensitive     * PROVIDENCIA STUARTII  Blood Culture ID Panel (Reflexed)     Status: None   Collection Time: 12/18/15  7:57 PM  Result Value Ref Range Status   Enterococcus species NOT DETECTED NOT DETECTED Final   Vancomycin resistance NOT DETECTED NOT DETECTED Final   Listeria monocytogenes NOT DETECTED NOT DETECTED Final   Staphylococcus species NOT DETECTED NOT DETECTED Final   Staphylococcus aureus NOT DETECTED NOT DETECTED Final   Methicillin resistance NOT DETECTED NOT DETECTED Final   Streptococcus species NOT DETECTED NOT DETECTED Final   Streptococcus agalactiae NOT DETECTED NOT DETECTED Final   Streptococcus pneumoniae NOT DETECTED NOT DETECTED Final   Streptococcus pyogenes NOT DETECTED NOT DETECTED Final   Acinetobacter baumannii NOT DETECTED NOT DETECTED Final   Enterobacteriaceae species NOT DETECTED NOT DETECTED Final   Enterobacter cloacae complex NOT DETECTED NOT DETECTED Final   Escherichia coli NOT DETECTED NOT DETECTED Final   Klebsiella oxytoca NOT DETECTED NOT DETECTED Final   Klebsiella pneumoniae NOT DETECTED NOT DETECTED Final   Proteus species NOT DETECTED NOT DETECTED Final   Serratia marcescens NOT DETECTED NOT DETECTED Final   Carbapenem resistance NOT DETECTED NOT DETECTED Final   Haemophilus influenzae NOT DETECTED NOT DETECTED Final   Neisseria meningitidis NOT DETECTED NOT DETECTED Final   Pseudomonas aeruginosa NOT DETECTED NOT DETECTED Final   Candida albicans NOT DETECTED NOT DETECTED Final   Candida glabrata NOT DETECTED NOT DETECTED Final   Candida krusei NOT DETECTED NOT DETECTED Final   Candida parapsilosis NOT DETECTED NOT DETECTED Final   Candida tropicalis NOT DETECTED NOT DETECTED Final  Culture, blood (routine x 2)     Status: Abnormal   Collection Time: 12/18/15   8:21 PM  Result Value Ref Range Status   Specimen Description BLOOD RIGHT ANTECUBITAL  Final   Special Requests IN PEDIATRIC BOTTLE  1CC  Final   Culture  Setup Time   Final    GRAM POSITIVE COCCI IN CLUSTERS AEROBIC BOTTLE ONLY CRITICAL RESULT CALLED TO, READ BACK BY AND VERIFIED WITH: J MARKEL 12/19/15 @ 1607 M [place]    Culture STAPHYLOCOCCUS SPECIES (COAGULASE NEGATIVE) (A)  Final   Report Status 12/22/2015 FINAL  Final   Organism ID, Bacteria STAPHYLOCOCCUS SPECIES (COAGULASE NEGATIVE)  Final      Susceptibility   Staphylococcus species (coagulase negative) - MIC*    CIPROFLOXACIN >=8 RESISTANT Resistant     ERYTHROMYCIN 0.5 SENSITIVE Sensitive     GENTAMICIN <=0.5 SENSITIVE Sensitive     OXACILLIN >=4 RESISTANT Resistant     TETRACYCLINE <=1 SENSITIVE Sensitive     VANCOMYCIN 1 SENSITIVE Sensitive     TRIMETH/SULFA 80 RESISTANT Resistant     CLINDAMYCIN <=0.25 SENSITIVE Sensitive     RIFAMPIN <=0.5 SENSITIVE Sensitive  Inducible Clindamycin NEGATIVE Sensitive     * STAPHYLOCOCCUS SPECIES (COAGULASE NEGATIVE)  Culture, blood (Routine X 2) w Reflex to ID Panel     Status: None (Preliminary result)   Collection Time: 12/22/15  4:00 PM  Result Value Ref Range Status   Specimen Description BLOOD LEFT ANTECUBITAL  Final   Special Requests BOTTLES DRAWN AEROBIC AND ANAEROBIC 10CC  Final   Culture NO GROWTH 3 DAYS  Final   Report Status PENDING  Incomplete  Culture, blood (Routine X 2) w Reflex to ID Panel     Status: None (Preliminary result)   Collection Time: 12/22/15  4:15 PM  Result Value Ref Range Status   Specimen Description BLOOD LEFT FOREARM  Final   Special Requests BOTTLES DRAWN AEROBIC AND ANAEROBIC 10CC  Final   Culture NO GROWTH 2 DAYS  Final   Report Status PENDING  Incomplete    Radiology Studies: Ct Abdomen Pelvis W Contrast 12/22/2015  Evidence of developing bowel obstruction or ileus, with transition point in the distal ileum. Etiology may be secondary to  adhesion of the right lower quadrant, or could potentially be reactive given the significant inflammatory changes involving the urinary system and the urinary bladder. Ill-defined inflammatory/ infectious changes of the low abdomen/ pelvis, where several viscera are involved. Inflammatory changes involve the dome of the urinary bladder, distal small bowel loops, and the sigmoid colon, with a source involving any of these. Favored etiology is secondary to chronic urinary tract infection given the imaging appearance over time and left-sided obstructive nephrolithiasis, however, diverticulitis not excluded. Persistent at least partially obstructive left-sided nephrolithiasis, with stones at the ureteropelvic junction and in the distal ureter just above the left ureterovesical junction. Inflammatory changes at the hilum of the left kidney have worsened since the comparison CT, compatible with pyelonephritis/urinary tract infection. Associated lymph nodes of the retroperitoneum. Aortic atherosclerosis. Trace bilateral pleural effusions.   Dg Abd 2 Views 12/20/2015  Diffuse gaseous distention of the small and large bowel, most suggestive of diffuse adynamic ileus. Distal small bowel obstruction cannot be excluded. If there is clinical concern for small bowel obstruction, consider correlation with CT abdomen/pelvis with oral and IV contrast. 2. Left renal stones.  Possible distal left ureteral stones.   Scheduled Meds: . baclofen  10 mg Oral TID  . ceFAZolin      . cefTRIAXone (ROCEPHIN)  IV  2 g Intravenous Q24H  . darifenacin  7.5 mg Oral Daily  . famotidine  20 mg Oral Daily  . feeding supplement (ENSURE ENLIVE)  237 mL Oral BID BM  . fentaNYL      . gabapentin  100 mg Oral QHS  . insulin aspart  0-9 Units Subcutaneous Q4H  . midazolam      . multivitamin with minerals  1 tablet Oral Daily  . polyethylene glycol  17 g Oral Daily  . pravastatin  20 mg Oral q1800  . [START ON 12/26/2015] rivaroxaban  20 mg  Oral Q supper  . senna-docusate  1 tablet Oral BID  . vancomycin  750 mg Intravenous Q12H  . vitamin C  500 mg Oral Daily   Continuous Infusions: . dextrose 5 % with kcl 50 mL/hr at 12/24/15 2222    LOS: 7 days   Time spent: 33 minutes   Gennesis Hogland, MD [facility] Pager [phone]  If 7PM-7AM, please contact night-coverage [email or web address] Password [facility] 12/25/2015, 3:03 PM

## 2015-12-25 NOTE — Sedation Documentation (Signed)
Patient is resting comfortably. No complaints at this time 

## 2015-12-25 NOTE — Progress Notes (Signed)
Speech Language Pathology Treatment: Dysphagia  Patient Details Name: [name] MRN: [id number] DOB: 12-03-1960 Today's Date: 12/25/2015 Time: [id number] SLP Time Calculation (min) (ACUTE ONLY): 10 min  Assessment / Plan / Recommendation Clinical Impression  Pt's lunch tray with regular textures was at bedside. Current diet order in the chart has since been updated to reflect SLP recommendation for Dys 2 diet. SLP offered trials of advanced foods. Pt needed a significant amount of time to masticate regular solids, as well as multiple pureed and thin liquid bolus to facilitate clearance. No overt s/s of aspiration observed, but this would not be functional to work through a meal. Would continue Dys 2 diet for now. Pt does say she was eating more solid foods PTA and does not care for the chopped POs. Will continue to follow briefly to determine if further advancements are appropriate.   HPI HPI: [name] is a 55 y.o. female, chronically ill looking, with history of MS, decubitus ulcers, documented AKI and CKD, UTI, DVT, CVA and malnutrition, admitted on 12/18/2015 with sepsis, UA done suggestive of UTI.       SLP Plan  Continue with current plan of care     Recommendations  Diet recommendations: Dysphagia 2 (fine chop);Thin liquid Liquids provided via: Straw;Cup Medication Administration: Whole meds with liquid Supervision: Staff to assist with self feeding;Full supervision/cueing for compensatory strategies Compensations: Slow rate;Small sips/bites Postural Changes and/or Swallow Maneuvers: Seated upright 90 degrees             Oral Care Recommendations: Oral care BID Follow up Recommendations: None Plan: Continue with current plan of care     GO               Germain Osgood, M.A. CCC-SLP [phone]  Germain Osgood 12/25/2015, 4:21 PM

## 2015-12-25 NOTE — Sedation Documentation (Signed)
Patient is resting comfortably. No complaints from pt at this time.  

## 2015-12-26 DIAGNOSIS — T83510D Infection and inflammatory reaction due to cystostomy catheter, subsequent encounter: Secondary | ICD-10-CM

## 2015-12-26 LAB — GLUCOSE, CAPILLARY
Glucose-Capillary: 113 mg/dL — ABNORMAL HIGH (ref 65–99)
Glucose-Capillary: 120 mg/dL — ABNORMAL HIGH (ref 65–99)
Glucose-Capillary: 123 mg/dL — ABNORMAL HIGH (ref 65–99)
Glucose-Capillary: 136 mg/dL — ABNORMAL HIGH (ref 65–99)
Glucose-Capillary: 166 mg/dL — ABNORMAL HIGH (ref 65–99)
Glucose-Capillary: 177 mg/dL — ABNORMAL HIGH (ref 65–99)
Glucose-Capillary: 199 mg/dL — ABNORMAL HIGH (ref 65–99)

## 2015-12-26 MED ORDER — SODIUM CHLORIDE 0.9% FLUSH
10.0000 mL | INTRAVENOUS | Status: DC | PRN
  Administered 2015-12-28 – 2015-12-30 (×2): 10 mL
  Filled 2015-12-26 (×2): qty 40

## 2015-12-26 NOTE — Progress Notes (Signed)
Patient ID: [name], female   DOB: Jan 04, 1961, 55 y.o.   MRN: [id number]    PROGRESS NOTE    [name]  [id number] DOB: May 15, 1961 DOA: 12/18/2015  PCP: unknown    Brief Narrative:  55 year old Female, chronically ill looking, with history of MS, decubitus ulcers, documented AKI and CKD, DVT, CVA and malnutrition. Patient lives at home, but has neighbor and Home Health checking on her. On presentation to the hospital, UA suggestive of UTI, and CBC reveals elevated WBC with bandemia. Lactic acid is elevated, and Scr has risen from 0.54 to 3.32. Patient was on Metformin, bactrim, PRN lasix and Xarelto 20 MG po once daily prior to current admission.   Of note, pt was just recently discharged on November 18, 2015 after being treated for Sepsis due to Providencia and Coag Neg Staph Bacteremia and Pyelonephritis that was thought to be most likely from chronic indwelling catheter. Pt was discharged on Bactrim to complete therapy for 14 days upon discharge. During last admission, [place] team evaluated pt and noted several superficial dime sized decubitus ulcers on bilat buttocks and stage 4 sacral wounds that are now mostly healed w/ pink dry scar tissue with small remaining pressure injury in the center of each location; to sacrum, left and right ischium, each site is approx .1X.1X.1cm, pink and dry.  7/14  Pt underwent left percutaneous nephrostomy tube and ordered a PICC LINE to be placed.   Assessment & Plan:   Active Problems: Sepsis secondary to UTI from indwelling foley cath and left pyelonephritis, Staph and providencia stuarti bacteremia (3/4 blood cultures positive for staph), pt with neurogenic bladder  - urine culture from 7/7 with providencia stuarti  and blood cultures from 7/7 with coag negative staph (3/4) and providencia stuarti  - unclear source, pt has chronic foley cath - continue vanc and Rocephin, the duration as per ID recommendations. To get atleast 14 days of IV antibiotics  and plan for PICC line placement.  - ID consulted and assistance is appreciated   Acute metabolic encephalopathy - secondary to the above imposed on dehydration, hypernatremia  -  Today she is more alert, and communicating.    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 Normal renal parameters this am.   Distended abd, adynamic Ileus, SBO - ? Adhesions and constipation - surgery consulted and recommended started clears and advance as tolerated.  - no indication for surgical intervention at this time .  Left-sided obstructive nephrolithiasis - partially obstructive left-sided nephrolithiasis, with stones at the ureteropelvic junction and in the distal ureter just above the left ureterovesical junction - IR consult placed as recommended by urologist - pt underwent  left percutaneous nephrostomy tube placement today, Without any complications.  - Continue IV antibiotics per ID for sepsis to complete the 14 day course.  - appreciate urology team following   Hypokalemia - supplemented and WNL   Hypernatremia - Na is now WNL - d/c dextrose fluids.   Anemia of chronic disease  Thought her hemoglobin on admisson was 10, probably a dehydrated sample.  Baseline hemoglobin is around 8, and it has been around 8, the last few values.   Hx of DVT  - xarelto was held initially and to be resumed tonight.  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    MS - PT eval requested  - pt has very limited motion in upper and lower extremities but has so far refused SNF  Leukocytosis: Slight worsening today, she has remained afebrile.  Monitor with a repeat CBC in am.      DVT prophylaxis: on Xarelto , resume tonight.  Code Status: Full Family Communication: No family at bedside, I asked pt to have RN page me once family arrives but I have been waiting for family to call for several days Disposition Plan: pending PT consult.   Consultants:   PCT  Surgery  ID  Urology   IR  SLP - dys II diet  Procedures:   Left nephrostromy tube placement by ID on 7/14.   Antimicrobials:   Zoxyn 7/7 --> changed to Rocephin 7/9 -->   Vanc 7/7 -->  Subjective: No new complaints. No fever or chills. No SOB or chest pain.   Objective: Filed Vitals:   12/25/15 1338 12/25/15 1520 12/25/15 2139 12/26/15 0500  BP: 137/76 127/69 119/63 121/64  Pulse: 86  99 88  Temp: 98.6 F (37 C) 99.3 F (37.4 C) 98.6 F (37 C) 98.9 F (37.2 C)  TempSrc:  Oral Oral Oral  Resp: 20 18  18   Height:      Weight:      SpO2: 98%  100% 98%    Intake/Output Summary (Last 24 hours) at 12/26/15 1016 Last data filed at 12/26/15 0500  Gross per 24 hour  Intake    350 ml  Output   2025 ml  Net  -1675 ml   Filed Weights   12/18/15 1031 12/18/15 1448  Weight: 77.111 kg (170 lb) 63.6 kg (140 lb 3.4 oz)    Examination:  General exam: Appears more alert, flat affect , reports feeling sore all over.  Respiratory system: Respiratory effort normal. Diminished breath sounds at bases , no wheezing or rhonchi.  Cardiovascular system: RRR. No JVD, murmurs, rubs, gallops or clicks. No pedal edema. Gastrointestinal system: Abdomen is distended, tympanic, slightly tender in epigastric area, diminished bowel sounds , left flank tenderness.  Central nervous system: Alert strength in upper and lower extremities 2/5, sensation diminished in upper and lower extremities. Appears to be oriented.   Data Reviewed: I have personally reviewed following labs and imaging studies  CBC:  Recent Labs Lab 12/20/15 0646 12/21/15 0559 12/22/15 0735 12/23/15 0219 12/25/15 0617  WBC 17.4*  15.9* 14.8* 14.7* 21.5*  HGB 10.4* 8.2* 8.1* 8.4* 8.3*  HCT 33.1* 25.8* 24.6* 26.2* 25.3*  MCV 86.6 83.8 83.7 83.4 83.5  PLT 361 359 353 382 [id number]*   Basic Metabolic Panel:  Recent Labs Lab 12/20/15 0646 12/21/15 0559 12/22/15 0735 12/23/15 0219 12/25/15 0617  NA 149* 143 139 137 137  K 3.1* 2.9* 4.3 4.0 4.2  CL 120* 115* 108 107 107  CO2 21* 21* 26 27 26   GLUCOSE 112* 184* 152* 167* 130*  BUN 29* 19 10 6  <5*  CREATININE 0.84 0.67 0.59 0.51 0.52  CALCIUM 9.8 9.1 9.0 9.1 9.6   Liver Function Tests: No results for input(s): AST, ALT, ALKPHOS, BILITOT, PROT, ALBUMIN in the last 168 hours. CBG:  Recent Labs Lab 12/25/15 1717 12/25/15 1948 12/26/15 0034 12/26/15 0426 12/26/15 0801  GLUCAP 134* 133* 123* 113* 120*  Urine analysis:    Component Value Date/Time   COLORURINE YELLOW 12/18/2015 1118   APPEARANCEUR TURBID* 12/18/2015 1118   LABSPEC 1.014 12/18/2015 1118   PHURINE 8.0 12/18/2015 1118   GLUCOSEU NEGATIVE 12/18/2015 1118   HGBUR MODERATE* 12/18/2015 1118   BILIRUBINUR NEGATIVE 12/18/2015 1118   KETONESUR NEGATIVE 12/18/2015 1118   PROTEINUR 100* 12/18/2015 1118   UROBILINOGEN 0.2 12/20/2014 1851   NITRITE NEGATIVE 12/18/2015 1118   LEUKOCYTESUR LARGE* 12/18/2015 1118    Recent Results (from the past 240 hour(s))  Blood culture (routine x 2)     Status: Abnormal   Collection Time: 12/18/15 10:35 AM  Result Value Ref Range Status   Specimen Description BLOOD LEFT HAND  Final   Special Requests BOTTLES DRAWN AEROBIC AND ANAEROBIC  5CC  Final   Culture  Setup Time   Final    GRAM POSITIVE COCCI IN CLUSTERS IN BOTH AEROBIC AND ANAEROBIC BOTTLES CRITICAL RESULT CALLED TO, READ BACK BY AND VERIFIED WITH: J MARKEL,PHARMD AT [id number] 12/19/15 BY L BENFIELD    Culture STAPHYLOCOCCUS SPECIES (COAGULASE NEGATIVE) (A)  Final   Report Status 12/21/2015 FINAL  Final   Organism ID, Bacteria STAPHYLOCOCCUS SPECIES (COAGULASE NEGATIVE)  Final      Susceptibility    Staphylococcus species (coagulase negative) - MIC*    CIPROFLOXACIN 1 SENSITIVE Sensitive     ERYTHROMYCIN <=0.25 SENSITIVE Sensitive     GENTAMICIN <=0.5 SENSITIVE Sensitive     OXACILLIN 0.5 RESISTANT Resistant     TETRACYCLINE <=1 SENSITIVE Sensitive     VANCOMYCIN 1 SENSITIVE Sensitive     TRIMETH/SULFA 20 SENSITIVE Sensitive     CLINDAMYCIN <=0.25 SENSITIVE Sensitive     RIFAMPIN <=0.5 SENSITIVE Sensitive     Inducible Clindamycin NEGATIVE Sensitive     * STAPHYLOCOCCUS SPECIES (COAGULASE NEGATIVE)  Blood Culture ID Panel (Reflexed)     Status: Abnormal   Collection Time: 12/18/15 10:35 AM  Result Value Ref Range Status   Enterococcus species NOT DETECTED NOT DETECTED Final   Vancomycin resistance NOT DETECTED NOT DETECTED Final   Listeria monocytogenes NOT DETECTED NOT DETECTED Final   Staphylococcus species DETECTED (A) NOT DETECTED Final    Comment: CRITICAL RESULT CALLED TO, READ BACK BY AND VERIFIED WITH: J MARKEL,PHARMD AT 1417 12/19/15 BY L BENFIELD    Staphylococcus aureus NOT DETECTED NOT DETECTED Final   Methicillin resistance DETECTED (A) NOT DETECTED Final    Comment: CRITICAL RESULT CALLED TO, READ BACK BY AND VERIFIED WITH: J MARKEL,PHARMD AT 1417 12/19/15 BY L BENFIELD    Streptococcus species NOT DETECTED NOT DETECTED Final   Streptococcus agalactiae NOT DETECTED NOT DETECTED Final   Streptococcus pneumoniae NOT DETECTED NOT DETECTED Final   Streptococcus pyogenes NOT DETECTED NOT DETECTED Final   Acinetobacter baumannii NOT DETECTED NOT DETECTED Final   Enterobacteriaceae species NOT DETECTED NOT DETECTED Final   Enterobacter cloacae complex NOT DETECTED NOT DETECTED Final   Escherichia coli NOT DETECTED NOT DETECTED Final   Klebsiella oxytoca NOT DETECTED NOT DETECTED Final   Klebsiella pneumoniae NOT DETECTED NOT DETECTED Final   Proteus species NOT DETECTED NOT DETECTED Final   Serratia marcescens NOT DETECTED NOT DETECTED Final   Carbapenem resistance NOT  DETECTED NOT DETECTED Final   Haemophilus influenzae NOT DETECTED NOT DETECTED Final   Neisseria meningitidis NOT DETECTED NOT DETECTED Final   Pseudomonas aeruginosa NOT DETECTED NOT DETECTED Final   Candida albicans NOT DETECTED NOT DETECTED Final   Candida glabrata NOT DETECTED NOT DETECTED Final  Candida krusei NOT DETECTED NOT DETECTED Final   Candida parapsilosis NOT DETECTED NOT DETECTED Final   Candida tropicalis NOT DETECTED NOT DETECTED Final  Urine culture     Status: Abnormal   Collection Time: 12/18/15 11:18 AM  Result Value Ref Range Status   Specimen Description URINE, CATHETERIZED  Final   Special Requests Normal  Final   Culture >=100,000 COLONIES/mL PROVIDENCIA STUARTII (A)  Final   Report Status 12/20/2015 FINAL  Final   Organism ID, Bacteria PROVIDENCIA STUARTII (A)  Final      Susceptibility   Providencia stuartii - MIC*    AMPICILLIN 16 RESISTANT Resistant     CEFAZOLIN >=64 RESISTANT Resistant     CEFTRIAXONE <=1 SENSITIVE Sensitive     CIPROFLOXACIN >=4 RESISTANT Resistant     GENTAMICIN >=16 RESISTANT Resistant     IMIPENEM 2 SENSITIVE Sensitive     NITROFURANTOIN 128 RESISTANT Resistant     TRIMETH/SULFA 40 SENSITIVE Sensitive     AMPICILLIN/SULBACTAM 8 SENSITIVE Sensitive     PIP/TAZO <=4 SENSITIVE Sensitive     * >=100,000 COLONIES/mL PROVIDENCIA STUARTII  Blood culture (routine x 2)     Status: Abnormal   Collection Time: 12/18/15 12:00 PM  Result Value Ref Range Status   Specimen Description BLOOD RIGHT HAND  Final   Special Requests IN PEDIATRIC BOTTLE  3CC  Final   Culture  Setup Time   Final    GRAM POSITIVE COCCI IN CLUSTERS AEROBIC BOTTLE ONLY CRITICAL RESULT CALLED TO, READ BACK BY AND VERIFIED WITH: J MARKEL,PHARMD AT 1417 12/19/15 BY L BENFIELD    Culture STAPHYLOCOCCUS SPECIES (COAGULASE NEGATIVE) (A)  Final   Report Status 12/23/2015 FINAL  Final   Organism ID, Bacteria STAPHYLOCOCCUS SPECIES (COAGULASE NEGATIVE)  Final       Susceptibility   Staphylococcus species (coagulase negative) - MIC*    CIPROFLOXACIN <=0.5 SENSITIVE Sensitive     ERYTHROMYCIN <=0.25 SENSITIVE Sensitive     GENTAMICIN <=0.5 SENSITIVE Sensitive     OXACILLIN <=0.25 SENSITIVE Sensitive     TETRACYCLINE <=1 SENSITIVE Sensitive     VANCOMYCIN <=0.5 SENSITIVE Sensitive     TRIMETH/SULFA 20 SENSITIVE Sensitive     CLINDAMYCIN <=0.25 SENSITIVE Sensitive     RIFAMPIN <=0.5 SENSITIVE Sensitive     Inducible Clindamycin NEGATIVE Sensitive     * STAPHYLOCOCCUS SPECIES (COAGULASE NEGATIVE)  MRSA PCR Screening     Status: None   Collection Time: 12/18/15  3:00 PM  Result Value Ref Range Status   MRSA by PCR NEGATIVE NEGATIVE Final    Comment:        The GeneXpert MRSA Assay (FDA approved for NASAL specimens only), is one component of a comprehensive MRSA colonization surveillance program. It is not intended to diagnose MRSA infection nor to guide or monitor treatment for MRSA infections.   Culture, blood (routine x 2)     Status: Abnormal   Collection Time: 12/18/15  7:57 PM  Result Value Ref Range Status   Specimen Description BLOOD LEFT ANTECUBITAL  Final   Special Requests IN PEDIATRIC BOTTLE  3CC  Final   Culture  Setup Time   Final    GRAM NEGATIVE RODS AEROBIC BOTTLE ONLY CRITICAL RESULT CALLED TO, READ BACK BY AND VERIFIED WITH: J [facility] 12/19/15 @ 72 M VESTAL    Culture PROVIDENCIA STUARTII (A)  Final   Report Status 12/21/2015 FINAL  Final   Organism ID, Bacteria PROVIDENCIA STUARTII  Final      Susceptibility  Providencia stuartii - MIC*    AMPICILLIN 16 RESISTANT Resistant     CEFAZOLIN >=64 RESISTANT Resistant     CEFEPIME <=1 SENSITIVE Sensitive     CEFTAZIDIME <=1 SENSITIVE Sensitive     CEFTRIAXONE <=1 SENSITIVE Sensitive     CIPROFLOXACIN >=4 RESISTANT Resistant     GENTAMICIN 8 RESISTANT Resistant     IMIPENEM 1 SENSITIVE Sensitive     TRIMETH/SULFA <=20 SENSITIVE Sensitive     AMPICILLIN/SULBACTAM 4  SENSITIVE Sensitive     PIP/TAZO <=4 SENSITIVE Sensitive     * PROVIDENCIA STUARTII  Blood Culture ID Panel (Reflexed)     Status: None   Collection Time: 12/18/15  7:57 PM  Result Value Ref Range Status   Enterococcus species NOT DETECTED NOT DETECTED Final   Vancomycin resistance NOT DETECTED NOT DETECTED Final   Listeria monocytogenes NOT DETECTED NOT DETECTED Final   Staphylococcus species NOT DETECTED NOT DETECTED Final   Staphylococcus aureus NOT DETECTED NOT DETECTED Final   Methicillin resistance NOT DETECTED NOT DETECTED Final   Streptococcus species NOT DETECTED NOT DETECTED Final   Streptococcus agalactiae NOT DETECTED NOT DETECTED Final   Streptococcus pneumoniae NOT DETECTED NOT DETECTED Final   Streptococcus pyogenes NOT DETECTED NOT DETECTED Final   Acinetobacter baumannii NOT DETECTED NOT DETECTED Final   Enterobacteriaceae species NOT DETECTED NOT DETECTED Final   Enterobacter cloacae complex NOT DETECTED NOT DETECTED Final   Escherichia coli NOT DETECTED NOT DETECTED Final   Klebsiella oxytoca NOT DETECTED NOT DETECTED Final   Klebsiella pneumoniae NOT DETECTED NOT DETECTED Final   Proteus species NOT DETECTED NOT DETECTED Final   Serratia marcescens NOT DETECTED NOT DETECTED Final   Carbapenem resistance NOT DETECTED NOT DETECTED Final   Haemophilus influenzae NOT DETECTED NOT DETECTED Final   Neisseria meningitidis NOT DETECTED NOT DETECTED Final   Pseudomonas aeruginosa NOT DETECTED NOT DETECTED Final   Candida albicans NOT DETECTED NOT DETECTED Final   Candida glabrata NOT DETECTED NOT DETECTED Final   Candida krusei NOT DETECTED NOT DETECTED Final   Candida parapsilosis NOT DETECTED NOT DETECTED Final   Candida tropicalis NOT DETECTED NOT DETECTED Final  Culture, blood (routine x 2)     Status: Abnormal   Collection Time: 12/18/15  8:21 PM  Result Value Ref Range Status   Specimen Description BLOOD RIGHT ANTECUBITAL  Final   Special Requests IN PEDIATRIC  BOTTLE  1CC  Final   Culture  Setup Time   Final    GRAM POSITIVE COCCI IN CLUSTERS AEROBIC BOTTLE ONLY CRITICAL RESULT CALLED TO, READ BACK BY AND VERIFIED WITH: J MARKEL 12/19/15 @ 1607 M VESTAL    Culture STAPHYLOCOCCUS SPECIES (COAGULASE NEGATIVE) (A)  Final   Report Status 12/22/2015 FINAL  Final   Organism ID, Bacteria STAPHYLOCOCCUS SPECIES (COAGULASE NEGATIVE)  Final      Susceptibility   Staphylococcus species (coagulase negative) - MIC*    CIPROFLOXACIN >=8 RESISTANT Resistant     ERYTHROMYCIN 0.5 SENSITIVE Sensitive     GENTAMICIN <=0.5 SENSITIVE Sensitive     OXACILLIN >=4 RESISTANT Resistant     TETRACYCLINE <=1 SENSITIVE Sensitive     VANCOMYCIN 1 SENSITIVE Sensitive     TRIMETH/SULFA 80 RESISTANT Resistant     CLINDAMYCIN <=0.25 SENSITIVE Sensitive     RIFAMPIN <=0.5 SENSITIVE Sensitive     Inducible Clindamycin NEGATIVE Sensitive     * STAPHYLOCOCCUS SPECIES (COAGULASE NEGATIVE)  Culture, blood (Routine X 2) w Reflex to ID Panel  Status: None (Preliminary result)   Collection Time: 12/22/15  4:00 PM  Result Value Ref Range Status   Specimen Description BLOOD LEFT ANTECUBITAL  Final   Special Requests BOTTLES DRAWN AEROBIC AND ANAEROBIC 10CC  Final   Culture NO GROWTH 3 DAYS  Final   Report Status PENDING  Incomplete  Culture, blood (Routine X 2) w Reflex to ID Panel     Status: None (Preliminary result)   Collection Time: 12/22/15  4:15 PM  Result Value Ref Range Status   Specimen Description BLOOD LEFT FOREARM  Final   Special Requests BOTTLES DRAWN AEROBIC AND ANAEROBIC 10CC  Final   Culture NO GROWTH 3 DAYS  Final   Report Status PENDING  Incomplete    Radiology Studies: Ct Abdomen Pelvis W Contrast 12/22/2015  Evidence of developing bowel obstruction or ileus, with transition point in the distal ileum. Etiology may be secondary to adhesion of the right lower quadrant, or could potentially be reactive given the significant inflammatory changes involving the  urinary system and the urinary bladder. Ill-defined inflammatory/ infectious changes of the low abdomen/ pelvis, where several viscera are involved. Inflammatory changes involve the dome of the urinary bladder, distal small bowel loops, and the sigmoid colon, with a source involving any of these. Favored etiology is secondary to chronic urinary tract infection given the imaging appearance over time and left-sided obstructive nephrolithiasis, however, diverticulitis not excluded. Persistent at least partially obstructive left-sided nephrolithiasis, with stones at the ureteropelvic junction and in the distal ureter just above the left ureterovesical junction. Inflammatory changes at the hilum of the left kidney have worsened since the comparison CT, compatible with pyelonephritis/urinary tract infection. Associated lymph nodes of the retroperitoneum. Aortic atherosclerosis. Trace bilateral pleural effusions.   Dg Abd 2 Views 12/20/2015  Diffuse gaseous distention of the small and large bowel, most suggestive of diffuse adynamic ileus. Distal small bowel obstruction cannot be excluded. If there is clinical concern for small bowel obstruction, consider correlation with CT abdomen/pelvis with oral and IV contrast. 2. Left renal stones.  Possible distal left ureteral stones.   Scheduled Meds: . baclofen  10 mg Oral TID  . cefTRIAXone (ROCEPHIN)  IV  2 g Intravenous Q24H  . darifenacin  7.5 mg Oral Daily  . famotidine  20 mg Oral Daily  . feeding supplement (ENSURE ENLIVE)  237 mL Oral BID BM  . gabapentin  100 mg Oral QHS  . insulin aspart  0-9 Units Subcutaneous Q4H  . multivitamin with minerals  1 tablet Oral Daily  . polyethylene glycol  17 g Oral Daily  . pravastatin  20 mg Oral q1800  . rivaroxaban  20 mg Oral Q supper  . senna-docusate  1 tablet Oral BID  . vancomycin  750 mg Intravenous Q12H  . vitamin C  500 mg Oral Daily   Continuous Infusions:    LOS: 8 days   Time spent: 30 minutes    Bonnell Public, MD [facility] Pager [phone]  If 7PM-7AM, please contact night-coverage [email or web address] Password [email or web address] 12/26/2015, 10:16 AM

## 2015-12-26 NOTE — Progress Notes (Signed)
Referring Physician(s): Dr Diona Fanti  Supervising Physician: Corrie Mckusick  Patient Status:  Inpatient  Chief Complaint:  . Left-sided percutaneous nephrostomy tube placement 12/25/2015 Left Hydronephrosis; nephrolithiasis   Subjective:  Doing some better today perked up from prior to procedure L PCN intact  Allergies: Review of patient's allergies indicates no known allergies.  Medications: Prior to Admission medications   Medication Sig Start Date End Date Taking? Authorizing Provider  Acetaminophen (APAP) 325 MG tablet Take 650 mg by mouth every 6 (six) hours as needed for pain.    Historical Provider, MD  albuterol (PROVENTIL HFA;VENTOLIN HFA) 108 (90 BASE) MCG/ACT inhaler Inhale 2 puffs into the lungs every 4 (four) hours as needed for wheezing or shortness of breath. 06/29/12   [name], MD  Amino Acids-Protein Hydrolys (FEEDING SUPPLEMENT, PRO-STAT SUGAR FREE 64,) LIQD Take 30 mLs by mouth 2 (two) times daily. 11/18/15   Cherene Altes, MD  baclofen (LIORESAL) 10 MG tablet Take 1 tablet (10 mg total) by mouth 3 (three) times daily. Patient taking differently: Take 10 mg by mouth 2 (two) times daily.  11/18/15   Cherene Altes, MD  famotidine (PEPCID) 20 MG tablet Take 1 tablet (20 mg total) by mouth 2 (two) times daily. 11/18/15   Cherene Altes, MD  furosemide (LASIX) 40 MG tablet Take 1 tablet (40 mg total) by mouth daily as needed for fluid or edema. Reported on 07/05/2015 Patient taking differently: Take 40 mg by mouth 2 (two) times daily. Reported on 07/05/2015 11/18/15   Cherene Altes, MD  gabapentin (NEURONTIN) 100 MG capsule Take 1 capsule (100 mg total) by mouth at bedtime. 11/18/15   Cherene Altes, MD  glipiZIDE (GLUCOTROL) 5 MG tablet Take 5 mg by mouth 2 (two) times daily. 11/30/15   Historical Provider, MD  HYDROcodone-acetaminophen (NORCO) 5-325 MG tablet Take 1 tablet by mouth every 6 (six) hours as needed for moderate pain. 11/18/15   Cherene Altes, MD  lisinopril (PRINIVIL,ZESTRIL) 2.5 MG tablet Take 2.5 mg by mouth daily. 11/30/15   Historical Provider, MD  lovastatin (MEVACOR) 20 MG tablet Take 20 mg by mouth daily. 11/30/15   Historical Provider, MD  metFORMIN (GLUCOPHAGE) 500 MG tablet Take 1 tablet (500 mg total) by mouth 2 (two) times daily. 11/18/15   Cherene Altes, MD  Multiple Vitamin (MULTIVITAMIN WITH MINERALS) TABS tablet Take 1 tablet by mouth daily. 07/09/15   Florencia Reasons, MD  Omega 3 1000 MG CAPS Take 1,000 mg by mouth daily.    Historical Provider, MD  polyethylene glycol (MIRALAX / GLYCOLAX) packet Take 17 g by mouth 2 (two) times daily. 11/18/15   Cherene Altes, MD  pravastatin (PRAVACHOL) 20 MG tablet Take 1 tablet (20 mg total) by mouth daily at 6 PM. 11/18/15   Cherene Altes, MD  rivaroxaban (XARELTO) 20 MG TABS tablet Take 1 tablet (20 mg total) by mouth daily with supper. Reported on 08/12/2015 11/18/15   Cherene Altes, MD  solifenacin (VESICARE) 5 MG tablet Take 1 tablet (5 mg total) by mouth daily. 11/18/15   Cherene Altes, MD  sulfamethoxazole-trimethoprim (BACTRIM DS,SEPTRA DS) 800-160 MG tablet Take 1 tablet by mouth every 12 (twelve) hours. 11/18/15   Cherene Altes, MD  vitamin C (ASCORBIC ACID) 500 MG tablet Take 500 mg by mouth daily.    Historical Provider, MD     Vital Signs: BP 121/64 mmHg  Pulse 88  Temp(Src) 98.9 F (37.2 C) (  Oral)  Resp 18  Ht 5\' 3"  (1.6 m)  Wt 140 lb 3.4 oz (63.6 kg)  BMI 24.84 kg/m2  SpO2 98%  Physical Exam  Abdominal: Soft.  Skin: Skin is warm and dry.  Site of L PCN tender to touch No bleeding Clean and dry  UOP yellow with flecks of infection noted 400 cc in bag  Nursing note and vitals reviewed.  Over 2L output yesterday  Imaging: Ir US Guide Vasc Access Left  12/25/2015  INDICATION: Sepsis, urinary tract infection and obstructive uropathy on the left with central pelvic calculi and additional distal left ureteral calculus. Left-sided percutaneous  nephrostomy has been requested for decompression. IV access was lost and there has been inability to re-establish IV access for conscious sedation. EXAM: 1. ULTRASOUND-GUIDED VASCULAR ACCESS OF THE LEFT BRACHIAL VEIN WITH IV ACCESS PLACEMENT 2. LEFT PERCUTANEOUS NEPHROSTOMY TUBE PLACEMENT WITH ULTRASOUND GUIDANCE IR NEPHROSTOMY PLACEMENT LEFT; IR RADIOLOGY PERIPHERAL GUIDED IV START; IR ULTRASOUND GUIDANCE VASC ACCESS LEFT COMPARISON:  CT of the abdomen and pelvis on 12/22/2015 MEDICATIONS: 2 g IV Ancef. As antibiotic prophylaxis, Ancef was ordered pre-procedure and administered intravenously within one hour of incision. ANESTHESIA/SEDATION: Fentanyl 75 mcg IV; Versed 1.5 mg IV Moderate Sedation Time:  20 minutes. The patient was continuously monitored during the procedure by the interventional radiology nurse under my direct supervision. CONTRAST:  20 mL Isovue-300 - administered into the collecting system(s) FLUOROSCOPY TIME:  Fluoroscopy Time: 2 minutes and 12 seconds. COMPLICATIONS: None immediate. PROCEDURE: Informed written consent was obtained from the patient after a thorough discussion of the procedural risks, benefits and alternatives. All questions were addressed. Maximal Sterile Barrier Technique was utilized including caps, mask, sterile gowns, sterile gloves, sterile drape, hand hygiene and skin antiseptic. A timeout was performed prior to the initiation of the procedure. Ultrasound was used to confirm patency of the left brachial vein. Under direct ultrasound guidance, a 21 gauge needle was advanced into the vein. After securing guidewire access, a 4 French micropuncture dilator was advanced into the vein. This is secured for use for IV access. The patient was placed in a prone position and sonographic localization of the left kidney performed. Skin was prepped with chlorhexidine. Local anesthesia was provided with 1% lidocaine. Under ultrasound guidance, a 21 gauge needle was advanced into the left  kidney. Contrast injection was performed under fluoroscopy. Additional access of the left kidney was then performed under ultrasound guidance with a second 21 gauge needle into the upper pole collecting system. Contrast injection was performed under fluoroscopy. A guidewire was advanced into the collecting system. A transitional dilator was placed. A guidewire was then advanced into the collecting system. Percutaneous access was dilated and a 10 French percutaneous nephrostomy tube advanced. The catheter was formed and injected with contrast material to confirm position. The catheter was then flushed and connected to a gravity drainage bag. The tube was secured at the skin with a Prolene retention suture and StatLock device. FINDINGS: Initial puncture of a dumbbell-shaped left renal cyst was performed. Contrast injection confirms that this is a large cyst with no communication with the collecting system. Two adjacent large calculi are identified in the renal pelvis. After separate upper pole access, opacification of the collecting system was accomplished. This demonstrates 2 calculi in the renal pelvis occupying most of the space of the renal pelvis. There is flow of contrast past the calculi and into the ureter. Degree of hydronephrosis is relatively mild. The renal cysts cause splaying of the interpolar  and lower pole collecting system. Via upper pole access, a nephrostomy tube was placed and advanced into the renal pelvis. IMPRESSION: 1. Ultrasound-guided venous access of the left brachial vein performed. 2. Left-sided percutaneous nephrostomy tube placement, as above. Initial access of a large dumbbell-shaped renal cyst was performed with contrast injection showing no communication with the collecting system. After upper pole access, a 10 French nephrostomy tube was placed and advanced to the renal pelvis. This was connected to a gravity drainage bag. Electronically Signed   By: Aletta Edouard M.D.   On:  12/25/2015 16:54   Ir Radiology Peripheral Guided Iv Start  12/25/2015  INDICATION: Sepsis, urinary tract infection and obstructive uropathy on the left with central pelvic calculi and additional distal left ureteral calculus. Left-sided percutaneous nephrostomy has been requested for decompression. IV access was lost and there has been inability to re-establish IV access for conscious sedation. EXAM: 1. ULTRASOUND-GUIDED VASCULAR ACCESS OF THE LEFT BRACHIAL VEIN WITH IV ACCESS PLACEMENT 2. LEFT PERCUTANEOUS NEPHROSTOMY TUBE PLACEMENT WITH ULTRASOUND GUIDANCE IR NEPHROSTOMY PLACEMENT LEFT; IR RADIOLOGY PERIPHERAL GUIDED IV START; IR ULTRASOUND GUIDANCE VASC ACCESS LEFT COMPARISON:  CT of the abdomen and pelvis on 12/22/2015 MEDICATIONS: 2 g IV Ancef. As antibiotic prophylaxis, Ancef was ordered pre-procedure and administered intravenously within one hour of incision. ANESTHESIA/SEDATION: Fentanyl 75 mcg IV; Versed 1.5 mg IV Moderate Sedation Time:  20 minutes. The patient was continuously monitored during the procedure by the interventional radiology nurse under my direct supervision. CONTRAST:  20 mL Isovue-300 - administered into the collecting system(s) FLUOROSCOPY TIME:  Fluoroscopy Time: 2 minutes and 12 seconds. COMPLICATIONS: None immediate. PROCEDURE: Informed written consent was obtained from the patient after a thorough discussion of the procedural risks, benefits and alternatives. All questions were addressed. Maximal Sterile Barrier Technique was utilized including caps, mask, sterile gowns, sterile gloves, sterile drape, hand hygiene and skin antiseptic. A timeout was performed prior to the initiation of the procedure. Ultrasound was used to confirm patency of the left brachial vein. Under direct ultrasound guidance, a 21 gauge needle was advanced into the vein. After securing guidewire access, a 4 French micropuncture dilator was advanced into the vein. This is secured for use for IV access. The  patient was placed in a prone position and sonographic localization of the left kidney performed. Skin was prepped with chlorhexidine. Local anesthesia was provided with 1% lidocaine. Under ultrasound guidance, a 21 gauge needle was advanced into the left kidney. Contrast injection was performed under fluoroscopy. Additional access of the left kidney was then performed under ultrasound guidance with a second 21 gauge needle into the upper pole collecting system. Contrast injection was performed under fluoroscopy. A guidewire was advanced into the collecting system. A transitional dilator was placed. A guidewire was then advanced into the collecting system. Percutaneous access was dilated and a 10 French percutaneous nephrostomy tube advanced. The catheter was formed and injected with contrast material to confirm position. The catheter was then flushed and connected to a gravity drainage bag. The tube was secured at the skin with a Prolene retention suture and StatLock device. FINDINGS: Initial puncture of a dumbbell-shaped left renal cyst was performed. Contrast injection confirms that this is a large cyst with no communication with the collecting system. Two adjacent large calculi are identified in the renal pelvis. After separate upper pole access, opacification of the collecting system was accomplished. This demonstrates 2 calculi in the renal pelvis occupying most of the space of the renal pelvis. There is  flow of contrast past the calculi and into the ureter. Degree of hydronephrosis is relatively mild. The renal cysts cause splaying of the interpolar and lower pole collecting system. Via upper pole access, a nephrostomy tube was placed and advanced into the renal pelvis. IMPRESSION: 1. Ultrasound-guided venous access of the left brachial vein performed. 2. Left-sided percutaneous nephrostomy tube placement, as above. Initial access of a large dumbbell-shaped renal cyst was performed with contrast injection  showing no communication with the collecting system. After upper pole access, a 10 French nephrostomy tube was placed and advanced to the renal pelvis. This was connected to a gravity drainage bag. Electronically Signed   By: Aletta Edouard M.D.   On: 12/25/2015 16:54   Ir Nephrostomy Placement Left  12/25/2015  INDICATION: Sepsis, urinary tract infection and obstructive uropathy on the left with central pelvic calculi and additional distal left ureteral calculus. Left-sided percutaneous nephrostomy has been requested for decompression. IV access was lost and there has been inability to re-establish IV access for conscious sedation. EXAM: 1. ULTRASOUND-GUIDED VASCULAR ACCESS OF THE LEFT BRACHIAL VEIN WITH IV ACCESS PLACEMENT 2. LEFT PERCUTANEOUS NEPHROSTOMY TUBE PLACEMENT WITH ULTRASOUND GUIDANCE IR NEPHROSTOMY PLACEMENT LEFT; IR RADIOLOGY PERIPHERAL GUIDED IV START; IR ULTRASOUND GUIDANCE VASC ACCESS LEFT COMPARISON:  CT of the abdomen and pelvis on 12/22/2015 MEDICATIONS: 2 g IV Ancef. As antibiotic prophylaxis, Ancef was ordered pre-procedure and administered intravenously within one hour of incision. ANESTHESIA/SEDATION: Fentanyl 75 mcg IV; Versed 1.5 mg IV Moderate Sedation Time:  20 minutes. The patient was continuously monitored during the procedure by the interventional radiology nurse under my direct supervision. CONTRAST:  20 mL Isovue-300 - administered into the collecting system(s) FLUOROSCOPY TIME:  Fluoroscopy Time: 2 minutes and 12 seconds. COMPLICATIONS: None immediate. PROCEDURE: Informed written consent was obtained from the patient after a thorough discussion of the procedural risks, benefits and alternatives. All questions were addressed. Maximal Sterile Barrier Technique was utilized including caps, mask, sterile gowns, sterile gloves, sterile drape, hand hygiene and skin antiseptic. A timeout was performed prior to the initiation of the procedure. Ultrasound was used to confirm patency of  the left brachial vein. Under direct ultrasound guidance, a 21 gauge needle was advanced into the vein. After securing guidewire access, a 4 French micropuncture dilator was advanced into the vein. This is secured for use for IV access. The patient was placed in a prone position and sonographic localization of the left kidney performed. Skin was prepped with chlorhexidine. Local anesthesia was provided with 1% lidocaine. Under ultrasound guidance, a 21 gauge needle was advanced into the left kidney. Contrast injection was performed under fluoroscopy. Additional access of the left kidney was then performed under ultrasound guidance with a second 21 gauge needle into the upper pole collecting system. Contrast injection was performed under fluoroscopy. A guidewire was advanced into the collecting system. A transitional dilator was placed. A guidewire was then advanced into the collecting system. Percutaneous access was dilated and a 10 French percutaneous nephrostomy tube advanced. The catheter was formed and injected with contrast material to confirm position. The catheter was then flushed and connected to a gravity drainage bag. The tube was secured at the skin with a Prolene retention suture and StatLock device. FINDINGS: Initial puncture of a dumbbell-shaped left renal cyst was performed. Contrast injection confirms that this is a large cyst with no communication with the collecting system. Two adjacent large calculi are identified in the renal pelvis. After separate upper pole access, opacification of the collecting  system was accomplished. This demonstrates 2 calculi in the renal pelvis occupying most of the space of the renal pelvis. There is flow of contrast past the calculi and into the ureter. Degree of hydronephrosis is relatively mild. The renal cysts cause splaying of the interpolar and lower pole collecting system. Via upper pole access, a nephrostomy tube was placed and advanced into the renal pelvis.  IMPRESSION: 1. Ultrasound-guided venous access of the left brachial vein performed. 2. Left-sided percutaneous nephrostomy tube placement, as above. Initial access of a large dumbbell-shaped renal cyst was performed with contrast injection showing no communication with the collecting system. After upper pole access, a 10 French nephrostomy tube was placed and advanced to the renal pelvis. This was connected to a gravity drainage bag. Electronically Signed   By: Aletta Edouard M.D.   On: 12/25/2015 16:54    Labs:  CBC:  Recent Labs  12/21/15 0559 12/22/15 0735 12/23/15 0219 12/25/15 0617  WBC 15.9* 14.8* 14.7* 21.5*  HGB 8.2* 8.1* 8.4* 8.3*  HCT 25.8* 24.6* 26.2* 25.3*  PLT 359 353 382 522*    COAGS:  Recent Labs  06/01/15 1930 07/05/15 2318 11/11/15 1447 12/25/15 0617  INR 1.10 1.14 1.86* 1.27  APTT  --  32 41* 35    BMP:  Recent Labs  12/21/15 0559 12/22/15 0735 12/23/15 0219 12/25/15 0617  NA 143 139 137 137  K 2.9* 4.3 4.0 4.2  CL 115* 108 107 107  CO2 21* 26 27 26   GLUCOSE 184* 152* 167* 130*  BUN 19 10 6  <5*  CALCIUM 9.1 9.0 9.1 9.6  CREATININE 0.67 0.59 0.51 0.52  GFRNONAA >60 >60 >60 >60  GFRAA >60 >60 >60 >60    LIVER FUNCTION TESTS:  Recent Labs  11/16/15 0334 11/17/15 0709 12/18/15 1035 12/19/15 0744  BILITOT 0.6 0.4 0.7 0.5  AST 11* 9* 15 12*  ALT 19 15 8* 7*  ALKPHOS 75 62 40 50  PROT 5.8* 6.0* 6.6 6.6  ALBUMIN 1.9* 2.0* 2.2* 2.3*    Assessment and Plan:  L PCN placed in IR 7/14 Draining well Will follow Plan per Uro  Electronically Signed: Magdalena Skilton A 12/26/2015, 11:59 AM   I spent a total of 15 Minutes at the the patient's bedside AND on the patient's hospital floor or unit, greater than 50% of which was counseling/coordinating care for L PCN

## 2015-12-26 NOTE — Progress Notes (Signed)
Pharmacy Antibiotic Note  [name] is a 55 y.o. female admitted on 12/18/2015 with sepsis secondary to UTI and L pyelonephritis.  Pharmacy has been consulted for ceftriaxone and vanc dosing. Today is day 9 of antibiotics: ceftriaxone for Providencia UTI and vanc for MSSE/MRSE bacteremia. Per ID, both antibiotics to continue for 14 days, or until the removal of obstructive L-renal stones; planned stop date is 7/21. Nephrostomy was placed 7/14. WBC is increasing; will continue to monitor. Last vanc trough was therapeutic at 17 on 7/12.   Plan: - Ceftriaxone 2g IV q24h   - Vancomycin 750mg  IV q12h. Goal trough of 15-20 - Will continue to monitor CBC, Scr, and s/sx's of infection - Will order a vanc trough for early next week  Height: 5\' 3"  (160 cm) Weight: 140 lb 3.4 oz (63.6 kg) IBW/kg (Calculated) : 52.4  Temp (24hrs), Avg:98.9 F (37.2 C), Min:98.6 F (37 C), Max:99.3 F (37.4 C)   Recent Labs Lab 12/19/15 1607 12/19/15 1812 12/20/15 0646 12/21/15 0559 12/22/15 0735 12/23/15 0219 12/25/15 0617  WBC  --   --  17.4* 15.9* 14.8* 14.7* 21.5*  CREATININE  --   --  0.84 0.67 0.59 0.51 0.52  LATICACIDVEN 2.1* 2.1*  --   --   --   --   --   VANCOTROUGH  --   --   --   --   --  17  --     Estimated Creatinine Clearance: 69.7 mL/min (by C-G formula based on Cr of 0.52).    No Known Allergies  Antimicrobials this admission: Ceftriaxone 7/11 >> Vancomycin 7/7 >>  Zosyn  7/7 >> 7/11 Ancef 2 gm x1 for procedure   Microbiology results: 7/7 BCx x2 1035: methicillin resistant CoNS (both bottles) 7/7 BCx x2 1200: GPC clusters 1/2 (final cx pending) 7/7 BCx x2 1957: Providencia stuartii 1/2 (S-Un/Zosyn, CTX, Imi, SMX/TMP) 7/7 BCx x2 2021: Coag negative staph 1/2 (pan-sensitive except R-Cipro) 7/7 UCx: > 100k of Providencia stuartii (S-Unasyn, CTX, imi, Zosyn, Bactrim) 7/7 MRSA PCR: negative 7/11 BCx x2: NGTD 7/111 BCx2: NGTD  Thank you for allowing pharmacy to be a part of this  patient's care.  Belia Heman, PharmD PGY1 Pharmacy Resident [phone] (Pager) 12/26/2015 2:26 PM

## 2015-12-26 NOTE — Progress Notes (Signed)
Peripherally Inserted Central Catheter/Midline Placement  The IV Nurse has discussed with the patient and/or persons authorized to consent for the patient, the purpose of this procedure and the potential benefits and risks involved with this procedure.  The benefits include less needle sticks, lab draws from the catheter, ability to perform PICC exchange if ordered by the physician and patient may be discharged home with the catheter.  Risks include, but not limited to, infection, bleeding, blood clot (thrombus formation), and puncture of an artery; nerve damage and irregular heat beat.  Alternatives to this procedure were also discussed.  Bard educational information given to pt.  PICC/Midline Placement Documentation  PICC Single Lumen 12/26/15 PICC Left Brachial 37 cm 1 cm (Active)  Indication for Insertion or Continuance of Line Home intravenous therapies (PICC only) 12/26/2015 10:41 AM  Exposed Catheter (cm) 1 cm 12/26/2015 10:41 AM  Site Assessment Clean;Dry;Intact 12/26/2015 10:41 AM  Line Status Flushed;Saline locked;Blood return noted 12/26/2015 10:41 AM  Dressing Type Transparent 12/26/2015 10:41 AM  Dressing Status Clean;Dry;Intact;Antimicrobial disc in place 12/26/2015 10:41 AM  Line Care Connections checked and tightened 12/26/2015 10:41 AM  Line Adjustment (NICU/IV Team Only) No 12/26/2015 10:41 AM  Dressing Intervention New dressing 12/26/2015 10:41 AM  Dressing Change Due 01/02/16 12/26/2015 10:41 AM       Rolena Infante 12/26/2015, 10:41 AM

## 2015-12-27 LAB — CULTURE, BLOOD (ROUTINE X 2)
CULTURE: NO GROWTH
CULTURE: NO GROWTH

## 2015-12-27 LAB — CBC WITH DIFFERENTIAL/PLATELET
Basophils Absolute: 0 10*3/uL (ref 0.0–0.1)
Basophils Relative: 0 %
Eosinophils Absolute: 0.4 10*3/uL (ref 0.0–0.7)
Eosinophils Relative: 2 %
HCT: 23 % — ABNORMAL LOW (ref 36.0–46.0)
Hemoglobin: 7.1 g/dL — ABNORMAL LOW (ref 12.0–15.0)
Lymphocytes Relative: 24 %
Lymphs Abs: 4.4 10*3/uL — ABNORMAL HIGH (ref 0.7–4.0)
MCH: 26.1 pg (ref 26.0–34.0)
MCHC: 30.9 g/dL (ref 30.0–36.0)
MCV: 84.6 fL (ref 78.0–100.0)
Monocytes Absolute: 1.3 10*3/uL — ABNORMAL HIGH (ref 0.1–1.0)
Monocytes Relative: 7 %
Neutro Abs: 12.1 10*3/uL — ABNORMAL HIGH (ref 1.7–7.7)
Neutrophils Relative %: 67 %
Platelets: 619 10*3/uL — ABNORMAL HIGH (ref 150–400)
RBC: 2.72 MIL/uL — ABNORMAL LOW (ref 3.87–5.11)
RDW: 19.7 % — ABNORMAL HIGH (ref 11.5–15.5)
WBC: 18.2 10*3/uL — ABNORMAL HIGH (ref 4.0–10.5)

## 2015-12-27 LAB — GLUCOSE, CAPILLARY
Glucose-Capillary: 128 mg/dL — ABNORMAL HIGH (ref 65–99)
Glucose-Capillary: 134 mg/dL — ABNORMAL HIGH (ref 65–99)
Glucose-Capillary: 163 mg/dL — ABNORMAL HIGH (ref 65–99)
Glucose-Capillary: 166 mg/dL — ABNORMAL HIGH (ref 65–99)
Glucose-Capillary: 167 mg/dL — ABNORMAL HIGH (ref 65–99)
Glucose-Capillary: 186 mg/dL — ABNORMAL HIGH (ref 65–99)

## 2015-12-27 NOTE — Progress Notes (Signed)
Patient ID: [name], female   DOB: 03-Jul-1960, 55 y.o.   MRN: [id number]    PROGRESS NOTE    [name]  [id number] DOB: [birth year] DOA: 12/18/2015  PCP: unknown    Brief Narrative:  55 year old Female, chronically ill looking, with history of MS, decubitus ulcers, documented AKI and CKD, DVT, CVA and malnutrition. Patient lives at home, but has neighbor and Home Health checking on her. On presentation to the hospital, UA suggestive of UTI, and CBC reveals elevated WBC with bandemia. Lactic acid is elevated, and Scr has risen from 0.54 to 3.32. Patient was on Metformin, bactrim, PRN lasix and Xarelto 20 MG po once daily prior to current admission.   Of note, pt was just recently discharged on November 18, 2015 after being treated for Sepsis due to Providencia and Coag Neg Staph Bacteremia and Pyelonephritis that was thought to be most likely from chronic indwelling catheter. Pt was discharged on Bactrim to complete therapy for 14 days upon discharge. During last admission, [place] team evaluated pt and noted several superficial dime sized decubitus ulcers on bilat buttocks and stage 4 sacral wounds that are now mostly healed w/ pink dry scar tissue with small remaining pressure injury in the center of each location; to sacrum, left and right ischium, each site is approx .1X.1X.1cm, pink and dry.  7/14  Pt underwent left percutaneous nephrostomy tube and ordered a PICC LINE to be placed.   Assessment & Plan:   Active Problems: Sepsis secondary to UTI from indwelling foley cath and left pyelonephritis, Staph and providencia stuarti bacteremia (3/4 blood cultures positive for staph), pt with neurogenic bladder  - urine culture from 7/7 with providencia stuarti  and blood cultures from 7/7 with coag negative staph (3/4) and providencia stuarti  - unclear source, pt has chronic foley cath - continue vanc and Rocephin, the duration as per ID recommendations. To get atleast 14 days of IV antibiotics  and plan for PICC line placement.  - ID consulted and assistance is appreciated   Acute metabolic encephalopathy - secondary to the above imposed on dehydration, hypernatremia  -  Today she is more alert, and communicating.    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 Normal renal parameters this am.   Distended abd, adynamic Ileus, SBO - ? Adhesions and constipation - surgery consulted and recommended started clears and advance as tolerated.  - no indication for surgical intervention at this time .  Left-sided obstructive nephrolithiasis - partially obstructive left-sided nephrolithiasis, with stones at the ureteropelvic junction and in the distal ureter just above the left ureterovesical junction - IR consult placed as recommended by urologist - pt underwent  left percutaneous nephrostomy tube placement today, Without any complications.  - Continue IV antibiotics per ID for sepsis to complete the 14 day course.  - appreciate urology team following   Hypokalemia - supplemented and WNL   Hypernatremia - Na is now WNL - d/c dextrose fluids.   Anemia of chronic disease - Monitor closely. Transfuse PRBC PRN. Thought her hemoglobin on admisson was 10, probably a dehydrated sample.  Baseline hemoglobin is around 8, and it has been around 8, the last few values.   Hx of DVT  - xarelto was held initially and  to be resumed tonight.   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    MS - PT eval requested  - pt has very limited motion in upper and lower extremities but has so far refused SNF  Leukocytosis: Slight worsening today, she has remained  afebrile.  Monitor with a repeat CBC in am.    DVT prophylaxis: on Xarelto , resume tonight.  Code Status: Full Family Communication: No family at bedside, I asked pt to have RN page me once family arrives but I have been waiting for family to call for several days Disposition Plan: pending PT consult.   Consultants:   PCT  Surgery  ID  Urology   IR  SLP - dys II diet  Procedures:   Left nephrostromy tube placement by ID on 7/14.   Antimicrobials:   Zoxyn 7/7 --> changed to Rocephin 7/9 -->   Vanc 7/7 -->  Subjective: No new complaints. No fever or chills. No SOB or chest pain.   Objective: Filed Vitals:   12/26/15 0500 12/26/15 1440 12/26/15 2124 12/27/15 0540  BP: 121/64 127/59 136/74 125/57  Pulse: 88 107 100 90  Temp: 98.9 F (37.2 C) 98.4 F (36.9 C) 99.5 F (37.5 C) 98.9 F (37.2 C)  TempSrc: Oral Oral Oral Oral  Resp: 18 20 16 16   Height:      Weight:      SpO2: 98% 97% 97% 98%    Intake/Output Summary (Last 24 hours) at 12/27/15 1035 Last data filed at 12/27/15 0956  Gross per 24 hour  Intake    840 ml  Output   1875 ml  Net  -1035 ml   Filed Weights   12/18/15 1031 12/18/15 1448  Weight: 77.111 kg (170 lb) 63.6 kg (140 lb 3.4 oz)    Examination:  General exam: Appears more alert, flat affect , reports feeling sore all over.  Respiratory system: Respiratory effort normal. Diminished breath sounds at bases , no wheezing or rhonchi.  Cardiovascular system: RRR. No JVD, murmurs, rubs, gallops or clicks. No pedal edema. Gastrointestinal system: Abdomen is distended, tympanic, slightly tender in epigastric area, diminished bowel sounds , left flank tenderness.  Central nervous system: Alert strength in upper and lower extremities 2/5, sensation diminished in upper and lower extremities. Appears to be oriented.   Data Reviewed: I have personally reviewed following labs and imaging studies  CBC:  Recent Labs Lab 12/21/15 0559  12/22/15 0735 12/23/15 0219 12/25/15 0617 12/27/15 0525  WBC 15.9* 14.8* 14.7* 21.5* 18.2*  NEUTROABS  --   --   --   --  12.1*  HGB 8.2* 8.1* 8.4* 8.3* 7.1*  HCT 25.8* 24.6* 26.2* 25.3* 23.0*  MCV 83.8 83.7 83.4 83.5 84.6  PLT 359 353 382 522* [id number]*   Basic Metabolic Panel:  Recent Labs Lab 12/21/15 0559 12/22/15 0735 12/23/15 0219 12/25/15 0617  NA 143 139 137 137  K 2.9* 4.3 4.0 4.2  CL 115* 108 107 107  CO2 21* 26 27 26   GLUCOSE 184* 152* 167* 130*  BUN 19 10 6  <5*  CREATININE 0.67 0.59 0.51 0.52  CALCIUM 9.1 9.0 9.1 9.6   Liver Function Tests: No results for input(s): AST, ALT, ALKPHOS, BILITOT, PROT, ALBUMIN in the last 168 hours. CBG:  Recent Labs Lab 12/26/15 1701 12/26/15 2018 12/26/15 2334 12/27/15  0409 12/27/15 0756  GLUCAP 199* 177* 136* 186* 134*   Urine analysis:    Component Value Date/Time   COLORURINE YELLOW 12/18/2015 1118   APPEARANCEUR TURBID* 12/18/2015 1118   LABSPEC 1.014 12/18/2015 1118   PHURINE 8.0 12/18/2015 1118   GLUCOSEU NEGATIVE 12/18/2015 1118   HGBUR MODERATE* 12/18/2015 1118   BILIRUBINUR NEGATIVE 12/18/2015 1118   KETONESUR NEGATIVE 12/18/2015 1118   PROTEINUR 100* 12/18/2015 1118   UROBILINOGEN 0.2 12/20/2014 1851   NITRITE NEGATIVE 12/18/2015 1118   LEUKOCYTESUR LARGE* 12/18/2015 1118    Recent Results (from the past 240 hour(s))  Blood culture (routine x 2)     Status: Abnormal   Collection Time: 12/18/15 10:35 AM  Result Value Ref Range Status   Specimen Description BLOOD LEFT HAND  Final   Special Requests BOTTLES DRAWN AEROBIC AND ANAEROBIC  5CC  Final   Culture  Setup Time   Final    GRAM POSITIVE COCCI IN CLUSTERS IN BOTH AEROBIC AND ANAEROBIC BOTTLES CRITICAL RESULT CALLED TO, READ BACK BY AND VERIFIED WITH: J MARKEL,PHARMD AT [id number] 12/19/15 BY L BENFIELD    Culture STAPHYLOCOCCUS SPECIES (COAGULASE NEGATIVE) (A)  Final   Report Status 12/21/2015 FINAL  Final   Organism ID, Bacteria STAPHYLOCOCCUS SPECIES  (COAGULASE NEGATIVE)  Final      Susceptibility   Staphylococcus species (coagulase negative) - MIC*    CIPROFLOXACIN 1 SENSITIVE Sensitive     ERYTHROMYCIN <=0.25 SENSITIVE Sensitive     GENTAMICIN <=0.5 SENSITIVE Sensitive     OXACILLIN 0.5 RESISTANT Resistant     TETRACYCLINE <=1 SENSITIVE Sensitive     VANCOMYCIN 1 SENSITIVE Sensitive     TRIMETH/SULFA 20 SENSITIVE Sensitive     CLINDAMYCIN <=0.25 SENSITIVE Sensitive     RIFAMPIN <=0.5 SENSITIVE Sensitive     Inducible Clindamycin NEGATIVE Sensitive     * STAPHYLOCOCCUS SPECIES (COAGULASE NEGATIVE)  Blood Culture ID Panel (Reflexed)     Status: Abnormal   Collection Time: 12/18/15 10:35 AM  Result Value Ref Range Status   Enterococcus species NOT DETECTED NOT DETECTED Final   Vancomycin resistance NOT DETECTED NOT DETECTED Final   Listeria monocytogenes NOT DETECTED NOT DETECTED Final   Staphylococcus species DETECTED (A) NOT DETECTED Final    Comment: CRITICAL RESULT CALLED TO, READ BACK BY AND VERIFIED WITH: J MARKEL,PHARMD AT 1417 12/19/15 BY L BENFIELD    Staphylococcus aureus NOT DETECTED NOT DETECTED Final   Methicillin resistance DETECTED (A) NOT DETECTED Final    Comment: CRITICAL RESULT CALLED TO, READ BACK BY AND VERIFIED WITH: J MARKEL,PHARMD AT 1417 12/19/15 BY L BENFIELD    Streptococcus species NOT DETECTED NOT DETECTED Final   Streptococcus agalactiae NOT DETECTED NOT DETECTED Final   Streptococcus pneumoniae NOT DETECTED NOT DETECTED Final   Streptococcus pyogenes NOT DETECTED NOT DETECTED Final   Acinetobacter baumannii NOT DETECTED NOT DETECTED Final   Enterobacteriaceae species NOT DETECTED NOT DETECTED Final   Enterobacter cloacae complex NOT DETECTED NOT DETECTED Final   Escherichia coli NOT DETECTED NOT DETECTED Final   Klebsiella oxytoca NOT DETECTED NOT DETECTED Final   Klebsiella pneumoniae NOT DETECTED NOT DETECTED Final   Proteus species NOT DETECTED NOT DETECTED Final   Serratia marcescens NOT  DETECTED NOT DETECTED Final   Carbapenem resistance NOT DETECTED NOT DETECTED Final   Haemophilus influenzae NOT DETECTED NOT DETECTED Final   Neisseria meningitidis NOT DETECTED NOT DETECTED Final   Pseudomonas aeruginosa NOT DETECTED NOT DETECTED Final   Candida albicans NOT DETECTED  NOT DETECTED Final   Candida glabrata NOT DETECTED NOT DETECTED Final   Candida krusei NOT DETECTED NOT DETECTED Final   Candida parapsilosis NOT DETECTED NOT DETECTED Final   Candida tropicalis NOT DETECTED NOT DETECTED Final  Urine culture     Status: Abnormal   Collection Time: 12/18/15 11:18 AM  Result Value Ref Range Status   Specimen Description URINE, CATHETERIZED  Final   Special Requests Normal  Final   Culture >=100,000 COLONIES/mL PROVIDENCIA STUARTII (A)  Final   Report Status 12/20/2015 FINAL  Final   Organism ID, Bacteria PROVIDENCIA STUARTII (A)  Final      Susceptibility   Providencia stuartii - MIC*    AMPICILLIN 16 RESISTANT Resistant     CEFAZOLIN >=64 RESISTANT Resistant     CEFTRIAXONE <=1 SENSITIVE Sensitive     CIPROFLOXACIN >=4 RESISTANT Resistant     GENTAMICIN >=16 RESISTANT Resistant     IMIPENEM 2 SENSITIVE Sensitive     NITROFURANTOIN 128 RESISTANT Resistant     TRIMETH/SULFA 40 SENSITIVE Sensitive     AMPICILLIN/SULBACTAM 8 SENSITIVE Sensitive     PIP/TAZO <=4 SENSITIVE Sensitive     * >=100,000 COLONIES/mL PROVIDENCIA STUARTII  Blood culture (routine x 2)     Status: Abnormal   Collection Time: 12/18/15 12:00 PM  Result Value Ref Range Status   Specimen Description BLOOD RIGHT HAND  Final   Special Requests IN PEDIATRIC BOTTLE  3CC  Final   Culture  Setup Time   Final    GRAM POSITIVE COCCI IN CLUSTERS AEROBIC BOTTLE ONLY CRITICAL RESULT CALLED TO, READ BACK BY AND VERIFIED WITH: J MARKEL,PHARMD AT 1417 12/19/15 BY L BENFIELD    Culture STAPHYLOCOCCUS SPECIES (COAGULASE NEGATIVE) (A)  Final   Report Status 12/23/2015 FINAL  Final   Organism ID, Bacteria  STAPHYLOCOCCUS SPECIES (COAGULASE NEGATIVE)  Final      Susceptibility   Staphylococcus species (coagulase negative) - MIC*    CIPROFLOXACIN <=0.5 SENSITIVE Sensitive     ERYTHROMYCIN <=0.25 SENSITIVE Sensitive     GENTAMICIN <=0.5 SENSITIVE Sensitive     OXACILLIN <=0.25 SENSITIVE Sensitive     TETRACYCLINE <=1 SENSITIVE Sensitive     VANCOMYCIN <=0.5 SENSITIVE Sensitive     TRIMETH/SULFA 20 SENSITIVE Sensitive     CLINDAMYCIN <=0.25 SENSITIVE Sensitive     RIFAMPIN <=0.5 SENSITIVE Sensitive     Inducible Clindamycin NEGATIVE Sensitive     * STAPHYLOCOCCUS SPECIES (COAGULASE NEGATIVE)  MRSA PCR Screening     Status: None   Collection Time: 12/18/15  3:00 PM  Result Value Ref Range Status   MRSA by PCR NEGATIVE NEGATIVE Final    Comment:        The GeneXpert MRSA Assay (FDA approved for NASAL specimens only), is one component of a comprehensive MRSA colonization surveillance program. It is not intended to diagnose MRSA infection nor to guide or monitor treatment for MRSA infections.   Culture, blood (routine x 2)     Status: Abnormal   Collection Time: 12/18/15  7:57 PM  Result Value Ref Range Status   Specimen Description BLOOD LEFT ANTECUBITAL  Final   Special Requests IN PEDIATRIC BOTTLE  3CC  Final   Culture  Setup Time   Final    GRAM NEGATIVE RODS AEROBIC BOTTLE ONLY CRITICAL RESULT CALLED TO, READ BACK BY AND VERIFIED WITH: J [facility] 12/19/15 @ 55 M VESTAL    Culture PROVIDENCIA STUARTII (A)  Final   Report Status 12/21/2015 FINAL  Final  Organism ID, Bacteria PROVIDENCIA STUARTII  Final      Susceptibility   Providencia stuartii - MIC*    AMPICILLIN 16 RESISTANT Resistant     CEFAZOLIN >=64 RESISTANT Resistant     CEFEPIME <=1 SENSITIVE Sensitive     CEFTAZIDIME <=1 SENSITIVE Sensitive     CEFTRIAXONE <=1 SENSITIVE Sensitive     CIPROFLOXACIN >=4 RESISTANT Resistant     GENTAMICIN 8 RESISTANT Resistant     IMIPENEM 1 SENSITIVE Sensitive     TRIMETH/SULFA  <=20 SENSITIVE Sensitive     AMPICILLIN/SULBACTAM 4 SENSITIVE Sensitive     PIP/TAZO <=4 SENSITIVE Sensitive     * PROVIDENCIA STUARTII  Blood Culture ID Panel (Reflexed)     Status: None   Collection Time: 12/18/15  7:57 PM  Result Value Ref Range Status   Enterococcus species NOT DETECTED NOT DETECTED Final   Vancomycin resistance NOT DETECTED NOT DETECTED Final   Listeria monocytogenes NOT DETECTED NOT DETECTED Final   Staphylococcus species NOT DETECTED NOT DETECTED Final   Staphylococcus aureus NOT DETECTED NOT DETECTED Final   Methicillin resistance NOT DETECTED NOT DETECTED Final   Streptococcus species NOT DETECTED NOT DETECTED Final   Streptococcus agalactiae NOT DETECTED NOT DETECTED Final   Streptococcus pneumoniae NOT DETECTED NOT DETECTED Final   Streptococcus pyogenes NOT DETECTED NOT DETECTED Final   Acinetobacter baumannii NOT DETECTED NOT DETECTED Final   Enterobacteriaceae species NOT DETECTED NOT DETECTED Final   Enterobacter cloacae complex NOT DETECTED NOT DETECTED Final   Escherichia coli NOT DETECTED NOT DETECTED Final   Klebsiella oxytoca NOT DETECTED NOT DETECTED Final   Klebsiella pneumoniae NOT DETECTED NOT DETECTED Final   Proteus species NOT DETECTED NOT DETECTED Final   Serratia marcescens NOT DETECTED NOT DETECTED Final   Carbapenem resistance NOT DETECTED NOT DETECTED Final   Haemophilus influenzae NOT DETECTED NOT DETECTED Final   Neisseria meningitidis NOT DETECTED NOT DETECTED Final   Pseudomonas aeruginosa NOT DETECTED NOT DETECTED Final   Candida albicans NOT DETECTED NOT DETECTED Final   Candida glabrata NOT DETECTED NOT DETECTED Final   Candida krusei NOT DETECTED NOT DETECTED Final   Candida parapsilosis NOT DETECTED NOT DETECTED Final   Candida tropicalis NOT DETECTED NOT DETECTED Final  Culture, blood (routine x 2)     Status: Abnormal   Collection Time: 12/18/15  8:21 PM  Result Value Ref Range Status   Specimen Description BLOOD RIGHT  ANTECUBITAL  Final   Special Requests IN PEDIATRIC BOTTLE  1CC  Final   Culture  Setup Time   Final    GRAM POSITIVE COCCI IN CLUSTERS AEROBIC BOTTLE ONLY CRITICAL RESULT CALLED TO, READ BACK BY AND VERIFIED WITH: J MARKEL 12/19/15 @ 1607 M VESTAL    Culture STAPHYLOCOCCUS SPECIES (COAGULASE NEGATIVE) (A)  Final   Report Status 12/22/2015 FINAL  Final   Organism ID, Bacteria STAPHYLOCOCCUS SPECIES (COAGULASE NEGATIVE)  Final      Susceptibility   Staphylococcus species (coagulase negative) - MIC*    CIPROFLOXACIN >=8 RESISTANT Resistant     ERYTHROMYCIN 0.5 SENSITIVE Sensitive     GENTAMICIN <=0.5 SENSITIVE Sensitive     OXACILLIN >=4 RESISTANT Resistant     TETRACYCLINE <=1 SENSITIVE Sensitive     VANCOMYCIN 1 SENSITIVE Sensitive     TRIMETH/SULFA 80 RESISTANT Resistant     CLINDAMYCIN <=0.25 SENSITIVE Sensitive     RIFAMPIN <=0.5 SENSITIVE Sensitive     Inducible Clindamycin NEGATIVE Sensitive     * STAPHYLOCOCCUS SPECIES (COAGULASE NEGATIVE)  Culture, blood (Routine X 2) w Reflex to ID Panel     Status: None (Preliminary result)   Collection Time: 12/22/15  4:00 PM  Result Value Ref Range Status   Specimen Description BLOOD LEFT ANTECUBITAL  Final   Special Requests BOTTLES DRAWN AEROBIC AND ANAEROBIC 10CC  Final   Culture NO GROWTH 4 DAYS  Final   Report Status PENDING  Incomplete  Culture, blood (Routine X 2) w Reflex to ID Panel     Status: None (Preliminary result)   Collection Time: 12/22/15  4:15 PM  Result Value Ref Range Status   Specimen Description BLOOD LEFT FOREARM  Final   Special Requests BOTTLES DRAWN AEROBIC AND ANAEROBIC 10CC  Final   Culture NO GROWTH 4 DAYS  Final   Report Status PENDING  Incomplete    Radiology Studies: Ct Abdomen Pelvis W Contrast 12/22/2015  Evidence of developing bowel obstruction or ileus, with transition point in the distal ileum. Etiology may be secondary to adhesion of the right lower quadrant, or could potentially be reactive given  the significant inflammatory changes involving the urinary system and the urinary bladder. Ill-defined inflammatory/ infectious changes of the low abdomen/ pelvis, where several viscera are involved. Inflammatory changes involve the dome of the urinary bladder, distal small bowel loops, and the sigmoid colon, with a source involving any of these. Favored etiology is secondary to chronic urinary tract infection given the imaging appearance over time and left-sided obstructive nephrolithiasis, however, diverticulitis not excluded. Persistent at least partially obstructive left-sided nephrolithiasis, with stones at the ureteropelvic junction and in the distal ureter just above the left ureterovesical junction. Inflammatory changes at the hilum of the left kidney have worsened since the comparison CT, compatible with pyelonephritis/urinary tract infection. Associated lymph nodes of the retroperitoneum. Aortic atherosclerosis. Trace bilateral pleural effusions.   Dg Abd 2 Views 12/20/2015  Diffuse gaseous distention of the small and large bowel, most suggestive of diffuse adynamic ileus. Distal small bowel obstruction cannot be excluded. If there is clinical concern for small bowel obstruction, consider correlation with CT abdomen/pelvis with oral and IV contrast. 2. Left renal stones.  Possible distal left ureteral stones.   Scheduled Meds: . baclofen  10 mg Oral TID  . cefTRIAXone (ROCEPHIN)  IV  2 g Intravenous Q24H  . darifenacin  7.5 mg Oral Daily  . famotidine  20 mg Oral Daily  . feeding supplement (ENSURE ENLIVE)  237 mL Oral BID BM  . gabapentin  100 mg Oral QHS  . insulin aspart  0-9 Units Subcutaneous Q4H  . multivitamin with minerals  1 tablet Oral Daily  . polyethylene glycol  17 g Oral Daily  . pravastatin  20 mg Oral q1800  . rivaroxaban  20 mg Oral Q supper  . senna-docusate  1 tablet Oral BID  . vancomycin  750 mg Intravenous Q12H  . vitamin C  500 mg Oral Daily   Continuous  Infusions:    LOS: 9 days   Time spent: 30 minutes   Bonnell Public, MD [facility] Pager [phone]  If 7PM-7AM, please contact night-coverage [email or web address] Password [email or web address] 12/27/2015, 10:35 AM

## 2015-12-27 NOTE — Progress Notes (Signed)
Leaking noted from foley today. Balloon inflated with 5cc more fluid. No further leakage noted. Pt tolerated well with no c/o discomfort.

## 2015-12-27 NOTE — Progress Notes (Signed)
Urology Inpatient Progress Report  Urinary tract infection associated with catheterization of urinary tract, initial encounter [T83.51XA, N39.0] Acute renal failure, unspecified acute renal failure type ([place]) [N17.9] Left percutaneous nephrostomy tube was placed on 12/25/15     Intv/Subj: No acute events overnight. Patient is without complaint. Patient feels better this a.m.  Principal Problem:   Catheter-associated urinary tract infection (HCC) Active Problems:   SIRS (systemic inflammatory response syndrome) (HCC)   Palliative care encounter   Bacteremia due to coagulase-negative Staphylococcus   Left nephrolithiasis   Gram-negative bacteremia (HCC)   Partial small bowel obstruction (HCC)   Acute renal failure (HCC)  Current Facility-Administered Medications  Medication Dose Route Frequency Provider Last Rate Last Dose  . albuterol (PROVENTIL) (2.5 MG/3ML) 0.083% nebulizer solution 2.5 mg  2.5 mg Nebulization Q6H PRN Bonnell Public, MD      . baclofen (LIORESAL) tablet 10 mg  10 mg Oral TID Bonnell Public, MD   10 mg at 12/27/15 0912  . cefTRIAXone (ROCEPHIN) 2 g in dextrose 5 % 50 mL IVPB  2 g Intravenous Q24H Collier Salina, MD   2 g at 12/26/15 1812  . darifenacin (ENABLEX) 24 hr tablet 7.5 mg  7.5 mg Oral Daily Bonnell Public, MD   7.5 mg at 12/27/15 0917  . famotidine (PEPCID) tablet 20 mg  20 mg Oral Daily Donalynn Furlong Killdeer, RPH   20 mg at 12/27/15 0912  . feeding supplement (ENSURE ENLIVE) (ENSURE ENLIVE) liquid 237 mL  237 mL Oral BID BM Theodis Blaze, MD   237 mL at 12/27/15 1000  . gabapentin (NEURONTIN) capsule 100 mg  100 mg Oral QHS Bonnell Public, MD   100 mg at 12/26/15 2100  . HYDROcodone-acetaminophen (NORCO/VICODIN) 5-325 MG per tablet 1 tablet  1 tablet Oral Q6H PRN Bonnell Public, MD   1 tablet at 12/26/15 2101  . insulin aspart (novoLOG) injection 0-9 Units  0-9 Units Subcutaneous Q4H Gardiner Barefoot, NP   1 Units at 12/27/15 0912   . morphine 2 MG/ML injection 1 mg  1 mg Intravenous Q3H PRN Gardiner Barefoot, NP   1 mg at 12/25/15 1702  . multivitamin with minerals tablet 1 tablet  1 tablet Oral Daily Bonnell Public, MD   1 tablet at 12/27/15 0912  . ondansetron (ZOFRAN) injection 4 mg  4 mg Intravenous Q6H PRN Lily Kocher, MD   4 mg at 12/20/15 0656  . polyethylene glycol (MIRALAX / GLYCOLAX) packet 17 g  17 g Oral Daily Theodis Blaze, MD   17 g at 12/27/15 0912  . pravastatin (PRAVACHOL) tablet 20 mg  20 mg Oral q1800 Bonnell Public, MD   20 mg at 12/26/15 1801  . rivaroxaban (XARELTO) tablet 20 mg  20 mg Oral Q supper Aletta Edouard, MD   20 mg at 12/26/15 1801  . senna-docusate (Senokot-S) tablet 1 tablet  1 tablet Oral BID Theodis Blaze, MD   1 tablet at 12/27/15 0912  . sodium chloride flush (NS) 0.9 % injection 10-40 mL  10-40 mL Intracatheter PRN Bonnell Public, MD      . vancomycin (VANCOCIN) IVPB 750 mg/150 ml premix  750 mg Intravenous Q12H Honor Loh, RPH   750 mg at 12/27/15 0456  . vitamin C (ASCORBIC ACID) tablet 500 mg  500 mg Oral Daily Bonnell Public, MD   500 mg at 12/27/15 0912     Objective: Vital: Filed Vitals:  12/26/15 0500 12/26/15 1440 12/26/15 2124 12/27/15 0540  BP: 121/64 127/59 136/74 125/57  Pulse: 88 107 100 90  Temp: 98.9 F (37.2 C) 98.4 F (36.9 C) 99.5 F (37.5 C) 98.9 F (37.2 C)  TempSrc: Oral Oral Oral Oral  Resp: 18 20 16 16   Height:      Weight:      SpO2: 98% 97% 97% 98%   I/Os: I/O last 3 completed shifts: In: 27 [P.O.:960; IV Piggyback:350] Out: 3400 [Urine:3400]  Physical Exam:  General: Patient is in no apparent distress Lungs: Normal respiratory effort, chest expands symmetrically. Abdomen: The abdomen is soft and nontender without mass. Nephrostomy tube is draining clear urine. Ext: lower extremities symmetric  Lab Results:  Recent Labs  12/25/15 0617 12/27/15 0525  WBC 21.5* 18.2*  HGB 8.3* 7.1*  HCT 25.3* 23.0*     Recent Labs  12/25/15 0617  NA 137  K 4.2  CL 107  CO2 26  GLUCOSE 130*  BUN <5*  CREATININE 0.52  CALCIUM 9.6    Recent Labs  12/25/15 0617  INR 1.27   No results for input(s): LABURIN in the last 72 hours. Results for orders placed or performed during the hospital encounter of 12/18/15  Blood culture (routine x 2)     Status: Abnormal   Collection Time: 12/18/15 10:35 AM  Result Value Ref Range Status   Specimen Description BLOOD LEFT HAND  Final   Special Requests BOTTLES DRAWN AEROBIC AND ANAEROBIC  5CC  Final   Culture  Setup Time   Final    GRAM POSITIVE COCCI IN CLUSTERS IN BOTH AEROBIC AND ANAEROBIC BOTTLES CRITICAL RESULT CALLED TO, READ BACK BY AND VERIFIED WITH: J MARKEL,PHARMD AT 1417 12/19/15 BY L BENFIELD    Culture STAPHYLOCOCCUS SPECIES (COAGULASE NEGATIVE) (A)  Final   Report Status 12/21/2015 FINAL  Final   Organism ID, Bacteria STAPHYLOCOCCUS SPECIES (COAGULASE NEGATIVE)  Final      Susceptibility   Staphylococcus species (coagulase negative) - MIC*    CIPROFLOXACIN 1 SENSITIVE Sensitive     ERYTHROMYCIN <=0.25 SENSITIVE Sensitive     GENTAMICIN <=0.5 SENSITIVE Sensitive     OXACILLIN 0.5 RESISTANT Resistant     TETRACYCLINE <=1 SENSITIVE Sensitive     VANCOMYCIN 1 SENSITIVE Sensitive     TRIMETH/SULFA 20 SENSITIVE Sensitive     CLINDAMYCIN <=0.25 SENSITIVE Sensitive     RIFAMPIN <=0.5 SENSITIVE Sensitive     Inducible Clindamycin NEGATIVE Sensitive     * STAPHYLOCOCCUS SPECIES (COAGULASE NEGATIVE)  Blood Culture ID Panel (Reflexed)     Status: Abnormal   Collection Time: 12/18/15 10:35 AM  Result Value Ref Range Status   Enterococcus species NOT DETECTED NOT DETECTED Final   Vancomycin resistance NOT DETECTED NOT DETECTED Final   Listeria monocytogenes NOT DETECTED NOT DETECTED Final   Staphylococcus species DETECTED (A) NOT DETECTED Final    Comment: CRITICAL RESULT CALLED TO, READ BACK BY AND VERIFIED WITH: J MARKEL,PHARMD AT 1417 12/19/15  BY L BENFIELD    Staphylococcus aureus NOT DETECTED NOT DETECTED Final   Methicillin resistance DETECTED (A) NOT DETECTED Final    Comment: CRITICAL RESULT CALLED TO, READ BACK BY AND VERIFIED WITH: J MARKEL,PHARMD AT 1417 12/19/15 BY L BENFIELD    Streptococcus species NOT DETECTED NOT DETECTED Final   Streptococcus agalactiae NOT DETECTED NOT DETECTED Final   Streptococcus pneumoniae NOT DETECTED NOT DETECTED Final   Streptococcus pyogenes NOT DETECTED NOT DETECTED Final   Acinetobacter baumannii NOT DETECTED  NOT DETECTED Final   Enterobacteriaceae species NOT DETECTED NOT DETECTED Final   Enterobacter cloacae complex NOT DETECTED NOT DETECTED Final   Escherichia coli NOT DETECTED NOT DETECTED Final   Klebsiella oxytoca NOT DETECTED NOT DETECTED Final   Klebsiella pneumoniae NOT DETECTED NOT DETECTED Final   Proteus species NOT DETECTED NOT DETECTED Final   Serratia marcescens NOT DETECTED NOT DETECTED Final   Carbapenem resistance NOT DETECTED NOT DETECTED Final   Haemophilus influenzae NOT DETECTED NOT DETECTED Final   Neisseria meningitidis NOT DETECTED NOT DETECTED Final   Pseudomonas aeruginosa NOT DETECTED NOT DETECTED Final   Candida albicans NOT DETECTED NOT DETECTED Final   Candida glabrata NOT DETECTED NOT DETECTED Final   Candida krusei NOT DETECTED NOT DETECTED Final   Candida parapsilosis NOT DETECTED NOT DETECTED Final   Candida tropicalis NOT DETECTED NOT DETECTED Final  Urine culture     Status: Abnormal   Collection Time: 12/18/15 11:18 AM  Result Value Ref Range Status   Specimen Description URINE, CATHETERIZED  Final   Special Requests Normal  Final   Culture >=100,000 COLONIES/mL PROVIDENCIA STUARTII (A)  Final   Report Status 12/20/2015 FINAL  Final   Organism ID, Bacteria PROVIDENCIA STUARTII (A)  Final      Susceptibility   Providencia stuartii - MIC*    AMPICILLIN 16 RESISTANT Resistant     CEFAZOLIN >=64 RESISTANT Resistant     CEFTRIAXONE <=1  SENSITIVE Sensitive     CIPROFLOXACIN >=4 RESISTANT Resistant     GENTAMICIN >=16 RESISTANT Resistant     IMIPENEM 2 SENSITIVE Sensitive     NITROFURANTOIN 128 RESISTANT Resistant     TRIMETH/SULFA 40 SENSITIVE Sensitive     AMPICILLIN/SULBACTAM 8 SENSITIVE Sensitive     PIP/TAZO <=4 SENSITIVE Sensitive     * >=100,000 COLONIES/mL PROVIDENCIA STUARTII  Blood culture (routine x 2)     Status: Abnormal   Collection Time: 12/18/15 12:00 PM  Result Value Ref Range Status   Specimen Description BLOOD RIGHT HAND  Final   Special Requests IN PEDIATRIC BOTTLE  3CC  Final   Culture  Setup Time   Final    GRAM POSITIVE COCCI IN CLUSTERS AEROBIC BOTTLE ONLY CRITICAL RESULT CALLED TO, READ BACK BY AND VERIFIED WITH: J MARKEL,PHARMD AT 1417 12/19/15 BY L BENFIELD    Culture STAPHYLOCOCCUS SPECIES (COAGULASE NEGATIVE) (A)  Final   Report Status 12/23/2015 FINAL  Final   Organism ID, Bacteria STAPHYLOCOCCUS SPECIES (COAGULASE NEGATIVE)  Final      Susceptibility   Staphylococcus species (coagulase negative) - MIC*    CIPROFLOXACIN <=0.5 SENSITIVE Sensitive     ERYTHROMYCIN <=0.25 SENSITIVE Sensitive     GENTAMICIN <=0.5 SENSITIVE Sensitive     OXACILLIN <=0.25 SENSITIVE Sensitive     TETRACYCLINE <=1 SENSITIVE Sensitive     VANCOMYCIN <=0.5 SENSITIVE Sensitive     TRIMETH/SULFA 20 SENSITIVE Sensitive     CLINDAMYCIN <=0.25 SENSITIVE Sensitive     RIFAMPIN <=0.5 SENSITIVE Sensitive     Inducible Clindamycin NEGATIVE Sensitive     * STAPHYLOCOCCUS SPECIES (COAGULASE NEGATIVE)  MRSA PCR Screening     Status: None   Collection Time: 12/18/15  3:00 PM  Result Value Ref Range Status   MRSA by PCR NEGATIVE NEGATIVE Final    Comment:        The GeneXpert MRSA Assay (FDA approved for NASAL specimens only), is one component of a comprehensive MRSA colonization surveillance program. It is not intended to diagnose MRSA infection  nor to guide or monitor treatment for MRSA infections.   Culture,  blood (routine x 2)     Status: Abnormal   Collection Time: 12/18/15  7:57 PM  Result Value Ref Range Status   Specimen Description BLOOD LEFT ANTECUBITAL  Final   Special Requests IN PEDIATRIC BOTTLE  3CC  Final   Culture  Setup Time   Final    GRAM NEGATIVE RODS AEROBIC BOTTLE ONLY CRITICAL RESULT CALLED TO, READ BACK BY AND VERIFIED WITH: J MARKEL 12/19/15 @ 1553 M VESTAL    Culture PROVIDENCIA STUARTII (A)  Final   Report Status 12/21/2015 FINAL  Final   Organism ID, Bacteria PROVIDENCIA STUARTII  Final      Susceptibility   Providencia stuartii - MIC*    AMPICILLIN 16 RESISTANT Resistant     CEFAZOLIN >=64 RESISTANT Resistant     CEFEPIME <=1 SENSITIVE Sensitive     CEFTAZIDIME <=1 SENSITIVE Sensitive     CEFTRIAXONE <=1 SENSITIVE Sensitive     CIPROFLOXACIN >=4 RESISTANT Resistant     GENTAMICIN 8 RESISTANT Resistant     IMIPENEM 1 SENSITIVE Sensitive     TRIMETH/SULFA <=20 SENSITIVE Sensitive     AMPICILLIN/SULBACTAM 4 SENSITIVE Sensitive     PIP/TAZO <=4 SENSITIVE Sensitive     * PROVIDENCIA STUARTII  Blood Culture ID Panel (Reflexed)     Status: None   Collection Time: 12/18/15  7:57 PM  Result Value Ref Range Status   Enterococcus species NOT DETECTED NOT DETECTED Final   Vancomycin resistance NOT DETECTED NOT DETECTED Final   Listeria monocytogenes NOT DETECTED NOT DETECTED Final   Staphylococcus species NOT DETECTED NOT DETECTED Final   Staphylococcus aureus NOT DETECTED NOT DETECTED Final   Methicillin resistance NOT DETECTED NOT DETECTED Final   Streptococcus species NOT DETECTED NOT DETECTED Final   Streptococcus agalactiae NOT DETECTED NOT DETECTED Final   Streptococcus pneumoniae NOT DETECTED NOT DETECTED Final   Streptococcus pyogenes NOT DETECTED NOT DETECTED Final   Acinetobacter baumannii NOT DETECTED NOT DETECTED Final   Enterobacteriaceae species NOT DETECTED NOT DETECTED Final   Enterobacter cloacae complex NOT DETECTED NOT DETECTED Final   Escherichia  coli NOT DETECTED NOT DETECTED Final   Klebsiella oxytoca NOT DETECTED NOT DETECTED Final   Klebsiella pneumoniae NOT DETECTED NOT DETECTED Final   Proteus species NOT DETECTED NOT DETECTED Final   Serratia marcescens NOT DETECTED NOT DETECTED Final   Carbapenem resistance NOT DETECTED NOT DETECTED Final   Haemophilus influenzae NOT DETECTED NOT DETECTED Final   Neisseria meningitidis NOT DETECTED NOT DETECTED Final   Pseudomonas aeruginosa NOT DETECTED NOT DETECTED Final   Candida albicans NOT DETECTED NOT DETECTED Final   Candida glabrata NOT DETECTED NOT DETECTED Final   Candida krusei NOT DETECTED NOT DETECTED Final   Candida parapsilosis NOT DETECTED NOT DETECTED Final   Candida tropicalis NOT DETECTED NOT DETECTED Final  Culture, blood (routine x 2)     Status: Abnormal   Collection Time: 12/18/15  8:21 PM  Result Value Ref Range Status   Specimen Description BLOOD RIGHT ANTECUBITAL  Final   Special Requests IN PEDIATRIC BOTTLE  1CC  Final   Culture  Setup Time   Final    GRAM POSITIVE COCCI IN CLUSTERS AEROBIC BOTTLE ONLY CRITICAL RESULT CALLED TO, READ BACK BY AND VERIFIED WITH: J MARKEL 12/19/15 @ 1607 M VESTAL    Culture STAPHYLOCOCCUS SPECIES (COAGULASE NEGATIVE) (A)  Final   Report Status 12/22/2015 FINAL  Final   Organism  ID, Bacteria STAPHYLOCOCCUS SPECIES (COAGULASE NEGATIVE)  Final      Susceptibility   Staphylococcus species (coagulase negative) - MIC*    CIPROFLOXACIN >=8 RESISTANT Resistant     ERYTHROMYCIN 0.5 SENSITIVE Sensitive     GENTAMICIN <=0.5 SENSITIVE Sensitive     OXACILLIN >=4 RESISTANT Resistant     TETRACYCLINE <=1 SENSITIVE Sensitive     VANCOMYCIN 1 SENSITIVE Sensitive     TRIMETH/SULFA 80 RESISTANT Resistant     CLINDAMYCIN <=0.25 SENSITIVE Sensitive     RIFAMPIN <=0.5 SENSITIVE Sensitive     Inducible Clindamycin NEGATIVE Sensitive     * STAPHYLOCOCCUS SPECIES (COAGULASE NEGATIVE)  Culture, blood (Routine X 2) w Reflex to ID Panel     Status:  None (Preliminary result)   Collection Time: 12/22/15  4:00 PM  Result Value Ref Range Status   Specimen Description BLOOD LEFT ANTECUBITAL  Final   Special Requests BOTTLES DRAWN AEROBIC AND ANAEROBIC 10CC  Final   Culture NO GROWTH 4 DAYS  Final   Report Status PENDING  Incomplete  Culture, blood (Routine X 2) w Reflex to ID Panel     Status: None (Preliminary result)   Collection Time: 12/22/15  4:15 PM  Result Value Ref Range Status   Specimen Description BLOOD LEFT FOREARM  Final   Special Requests BOTTLES DRAWN AEROBIC AND ANAEROBIC 10CC  Final   Culture NO GROWTH 4 DAYS  Final   Report Status PENDING  Incomplete    Studies/Results: Ir US Guide Vasc Access Left  12/25/2015  INDICATION: Sepsis, urinary tract infection and obstructive uropathy on the left with central pelvic calculi and additional distal left ureteral calculus. Left-sided percutaneous nephrostomy has been requested for decompression. IV access was lost and there has been inability to re-establish IV access for conscious sedation. EXAM: 1. ULTRASOUND-GUIDED VASCULAR ACCESS OF THE LEFT BRACHIAL VEIN WITH IV ACCESS PLACEMENT 2. LEFT PERCUTANEOUS NEPHROSTOMY TUBE PLACEMENT WITH ULTRASOUND GUIDANCE IR NEPHROSTOMY PLACEMENT LEFT; IR RADIOLOGY PERIPHERAL GUIDED IV START; IR ULTRASOUND GUIDANCE VASC ACCESS LEFT COMPARISON:  CT of the abdomen and pelvis on 12/22/2015 MEDICATIONS: 2 g IV Ancef. As antibiotic prophylaxis, Ancef was ordered pre-procedure and administered intravenously within one hour of incision. ANESTHESIA/SEDATION: Fentanyl 75 mcg IV; Versed 1.5 mg IV Moderate Sedation Time:  20 minutes. The patient was continuously monitored during the procedure by the interventional radiology nurse under my direct supervision. CONTRAST:  20 mL Isovue-300 - administered into the collecting system(s) FLUOROSCOPY TIME:  Fluoroscopy Time: 2 minutes and 12 seconds. COMPLICATIONS: None immediate. PROCEDURE: Informed written consent was  obtained from the patient after a thorough discussion of the procedural risks, benefits and alternatives. All questions were addressed. Maximal Sterile Barrier Technique was utilized including caps, mask, sterile gowns, sterile gloves, sterile drape, hand hygiene and skin antiseptic. A timeout was performed prior to the initiation of the procedure. Ultrasound was used to confirm patency of the left brachial vein. Under direct ultrasound guidance, a 21 gauge needle was advanced into the vein. After securing guidewire access, a 4 French micropuncture dilator was advanced into the vein. This is secured for use for IV access. The patient was placed in a prone position and sonographic localization of the left kidney performed. Skin was prepped with chlorhexidine. Local anesthesia was provided with 1% lidocaine. Under ultrasound guidance, a 21 gauge needle was advanced into the left kidney. Contrast injection was performed under fluoroscopy. Additional access of the left kidney was then performed under ultrasound guidance with a second 21 gauge  needle into the upper pole collecting system. Contrast injection was performed under fluoroscopy. A guidewire was advanced into the collecting system. A transitional dilator was placed. A guidewire was then advanced into the collecting system. Percutaneous access was dilated and a 10 French percutaneous nephrostomy tube advanced. The catheter was formed and injected with contrast material to confirm position. The catheter was then flushed and connected to a gravity drainage bag. The tube was secured at the skin with a Prolene retention suture and StatLock device. FINDINGS: Initial puncture of a dumbbell-shaped left renal cyst was performed. Contrast injection confirms that this is a large cyst with no communication with the collecting system. Two adjacent large calculi are identified in the renal pelvis. After separate upper pole access, opacification of the collecting system was  accomplished. This demonstrates 2 calculi in the renal pelvis occupying most of the space of the renal pelvis. There is flow of contrast past the calculi and into the ureter. Degree of hydronephrosis is relatively mild. The renal cysts cause splaying of the interpolar and lower pole collecting system. Via upper pole access, a nephrostomy tube was placed and advanced into the renal pelvis. IMPRESSION: 1. Ultrasound-guided venous access of the left brachial vein performed. 2. Left-sided percutaneous nephrostomy tube placement, as above. Initial access of a large dumbbell-shaped renal cyst was performed with contrast injection showing no communication with the collecting system. After upper pole access, a 10 French nephrostomy tube was placed and advanced to the renal pelvis. This was connected to a gravity drainage bag. Electronically Signed   By: Aletta Edouard M.D.   On: 12/25/2015 16:54   Ir Radiology Peripheral Guided Iv Start  12/25/2015  INDICATION: Sepsis, urinary tract infection and obstructive uropathy on the left with central pelvic calculi and additional distal left ureteral calculus. Left-sided percutaneous nephrostomy has been requested for decompression. IV access was lost and there has been inability to re-establish IV access for conscious sedation. EXAM: 1. ULTRASOUND-GUIDED VASCULAR ACCESS OF THE LEFT BRACHIAL VEIN WITH IV ACCESS PLACEMENT 2. LEFT PERCUTANEOUS NEPHROSTOMY TUBE PLACEMENT WITH ULTRASOUND GUIDANCE IR NEPHROSTOMY PLACEMENT LEFT; IR RADIOLOGY PERIPHERAL GUIDED IV START; IR ULTRASOUND GUIDANCE VASC ACCESS LEFT COMPARISON:  CT of the abdomen and pelvis on 12/22/2015 MEDICATIONS: 2 g IV Ancef. As antibiotic prophylaxis, Ancef was ordered pre-procedure and administered intravenously within one hour of incision. ANESTHESIA/SEDATION: Fentanyl 75 mcg IV; Versed 1.5 mg IV Moderate Sedation Time:  20 minutes. The patient was continuously monitored during the procedure by the interventional  radiology nurse under my direct supervision. CONTRAST:  20 mL Isovue-300 - administered into the collecting system(s) FLUOROSCOPY TIME:  Fluoroscopy Time: 2 minutes and 12 seconds. COMPLICATIONS: None immediate. PROCEDURE: Informed written consent was obtained from the patient after a thorough discussion of the procedural risks, benefits and alternatives. All questions were addressed. Maximal Sterile Barrier Technique was utilized including caps, mask, sterile gowns, sterile gloves, sterile drape, hand hygiene and skin antiseptic. A timeout was performed prior to the initiation of the procedure. Ultrasound was used to confirm patency of the left brachial vein. Under direct ultrasound guidance, a 21 gauge needle was advanced into the vein. After securing guidewire access, a 4 French micropuncture dilator was advanced into the vein. This is secured for use for IV access. The patient was placed in a prone position and sonographic localization of the left kidney performed. Skin was prepped with chlorhexidine. Local anesthesia was provided with 1% lidocaine. Under ultrasound guidance, a 21 gauge needle was advanced into the left  kidney. Contrast injection was performed under fluoroscopy. Additional access of the left kidney was then performed under ultrasound guidance with a second 21 gauge needle into the upper pole collecting system. Contrast injection was performed under fluoroscopy. A guidewire was advanced into the collecting system. A transitional dilator was placed. A guidewire was then advanced into the collecting system. Percutaneous access was dilated and a 10 French percutaneous nephrostomy tube advanced. The catheter was formed and injected with contrast material to confirm position. The catheter was then flushed and connected to a gravity drainage bag. The tube was secured at the skin with a Prolene retention suture and StatLock device. FINDINGS: Initial puncture of a dumbbell-shaped left renal cyst was  performed. Contrast injection confirms that this is a large cyst with no communication with the collecting system. Two adjacent large calculi are identified in the renal pelvis. After separate upper pole access, opacification of the collecting system was accomplished. This demonstrates 2 calculi in the renal pelvis occupying most of the space of the renal pelvis. There is flow of contrast past the calculi and into the ureter. Degree of hydronephrosis is relatively mild. The renal cysts cause splaying of the interpolar and lower pole collecting system. Via upper pole access, a nephrostomy tube was placed and advanced into the renal pelvis. IMPRESSION: 1. Ultrasound-guided venous access of the left brachial vein performed. 2. Left-sided percutaneous nephrostomy tube placement, as above. Initial access of a large dumbbell-shaped renal cyst was performed with contrast injection showing no communication with the collecting system. After upper pole access, a 10 French nephrostomy tube was placed and advanced to the renal pelvis. This was connected to a gravity drainage bag. Electronically Signed   By: Aletta Edouard M.D.   On: 12/25/2015 16:54   Ir Nephrostomy Placement Left  12/25/2015  INDICATION: Sepsis, urinary tract infection and obstructive uropathy on the left with central pelvic calculi and additional distal left ureteral calculus. Left-sided percutaneous nephrostomy has been requested for decompression. IV access was lost and there has been inability to re-establish IV access for conscious sedation. EXAM: 1. ULTRASOUND-GUIDED VASCULAR ACCESS OF THE LEFT BRACHIAL VEIN WITH IV ACCESS PLACEMENT 2. LEFT PERCUTANEOUS NEPHROSTOMY TUBE PLACEMENT WITH ULTRASOUND GUIDANCE IR NEPHROSTOMY PLACEMENT LEFT; IR RADIOLOGY PERIPHERAL GUIDED IV START; IR ULTRASOUND GUIDANCE VASC ACCESS LEFT COMPARISON:  CT of the abdomen and pelvis on 12/22/2015 MEDICATIONS: 2 g IV Ancef. As antibiotic prophylaxis, Ancef was ordered  pre-procedure and administered intravenously within one hour of incision. ANESTHESIA/SEDATION: Fentanyl 75 mcg IV; Versed 1.5 mg IV Moderate Sedation Time:  20 minutes. The patient was continuously monitored during the procedure by the interventional radiology nurse under my direct supervision. CONTRAST:  20 mL Isovue-300 - administered into the collecting system(s) FLUOROSCOPY TIME:  Fluoroscopy Time: 2 minutes and 12 seconds. COMPLICATIONS: None immediate. PROCEDURE: Informed written consent was obtained from the patient after a thorough discussion of the procedural risks, benefits and alternatives. All questions were addressed. Maximal Sterile Barrier Technique was utilized including caps, mask, sterile gowns, sterile gloves, sterile drape, hand hygiene and skin antiseptic. A timeout was performed prior to the initiation of the procedure. Ultrasound was used to confirm patency of the left brachial vein. Under direct ultrasound guidance, a 21 gauge needle was advanced into the vein. After securing guidewire access, a 4 French micropuncture dilator was advanced into the vein. This is secured for use for IV access. The patient was placed in a prone position and sonographic localization of the left kidney performed. Skin was  prepped with chlorhexidine. Local anesthesia was provided with 1% lidocaine. Under ultrasound guidance, a 21 gauge needle was advanced into the left kidney. Contrast injection was performed under fluoroscopy. Additional access of the left kidney was then performed under ultrasound guidance with a second 21 gauge needle into the upper pole collecting system. Contrast injection was performed under fluoroscopy. A guidewire was advanced into the collecting system. A transitional dilator was placed. A guidewire was then advanced into the collecting system. Percutaneous access was dilated and a 10 French percutaneous nephrostomy tube advanced. The catheter was formed and injected with contrast material  to confirm position. The catheter was then flushed and connected to a gravity drainage bag. The tube was secured at the skin with a Prolene retention suture and StatLock device. FINDINGS: Initial puncture of a dumbbell-shaped left renal cyst was performed. Contrast injection confirms that this is a large cyst with no communication with the collecting system. Two adjacent large calculi are identified in the renal pelvis. After separate upper pole access, opacification of the collecting system was accomplished. This demonstrates 2 calculi in the renal pelvis occupying most of the space of the renal pelvis. There is flow of contrast past the calculi and into the ureter. Degree of hydronephrosis is relatively mild. The renal cysts cause splaying of the interpolar and lower pole collecting system. Via upper pole access, a nephrostomy tube was placed and advanced into the renal pelvis. IMPRESSION: 1. Ultrasound-guided venous access of the left brachial vein performed. 2. Left-sided percutaneous nephrostomy tube placement, as above. Initial access of a large dumbbell-shaped renal cyst was performed with contrast injection showing no communication with the collecting system. After upper pole access, a 10 French nephrostomy tube was placed and advanced to the renal pelvis. This was connected to a gravity drainage bag. Electronically Signed   By: Aletta Edouard M.D.   On: 12/25/2015 16:54    Assessment: Patient with left renal pelvic stone as well as a large left distal ureteral stone status post nephrostomy tube placement. She is improved from a symptomatic perspective. She is being treated with broad-spectrum antibiotics for urine culture.  Plan: Recommend continued conservative management with ongoing antibiotics. Patient will need a percutaneous nephrolithotomy at some point in the near future, I will defer the timing of this to Dr. Diona Fanti who will be back Monday morning.   Louis Meckel,  MD Urology 12/27/2015, 11:36 AM

## 2015-12-27 NOTE — Progress Notes (Signed)
Referring Physician(s): Dr Preston Fleeting  Supervising Physician: Sandi Mariscal  Patient Status:  Inpatient  Chief Complaint:  Left hydro/nephrolithiasis L percutaneous nephrostomy placed 7/14  Subjective:  Up in bed No complaints Eating reg diet Feels better  Allergies: Review of patient's allergies indicates no known allergies.  Medications: Prior to Admission medications   Medication Sig Start Date End Date Taking? Authorizing Provider  Acetaminophen (APAP) 325 MG tablet Take 650 mg by mouth every 6 (six) hours as needed for pain.    Historical Provider, MD  albuterol (PROVENTIL HFA;VENTOLIN HFA) 108 (90 BASE) MCG/ACT inhaler Inhale 2 puffs into the lungs every 4 (four) hours as needed for wheezing or shortness of breath. 06/29/12   [name], MD  Amino Acids-Protein Hydrolys (FEEDING SUPPLEMENT, PRO-STAT SUGAR FREE 64,) LIQD Take 30 mLs by mouth 2 (two) times daily. 11/18/15   Cherene Altes, MD  baclofen (LIORESAL) 10 MG tablet Take 1 tablet (10 mg total) by mouth 3 (three) times daily. Patient taking differently: Take 10 mg by mouth 2 (two) times daily.  11/18/15   Cherene Altes, MD  famotidine (PEPCID) 20 MG tablet Take 1 tablet (20 mg total) by mouth 2 (two) times daily. 11/18/15   Cherene Altes, MD  furosemide (LASIX) 40 MG tablet Take 1 tablet (40 mg total) by mouth daily as needed for fluid or edema. Reported on 07/05/2015 Patient taking differently: Take 40 mg by mouth 2 (two) times daily. Reported on 07/05/2015 11/18/15   Cherene Altes, MD  gabapentin (NEURONTIN) 100 MG capsule Take 1 capsule (100 mg total) by mouth at bedtime. 11/18/15   Cherene Altes, MD  glipiZIDE (GLUCOTROL) 5 MG tablet Take 5 mg by mouth 2 (two) times daily. 11/30/15   Historical Provider, MD  HYDROcodone-acetaminophen (NORCO) 5-325 MG tablet Take 1 tablet by mouth every 6 (six) hours as needed for moderate pain. 11/18/15   Cherene Altes, MD  lisinopril (PRINIVIL,ZESTRIL) 2.5 MG  tablet Take 2.5 mg by mouth daily. 11/30/15   Historical Provider, MD  lovastatin (MEVACOR) 20 MG tablet Take 20 mg by mouth daily. 11/30/15   Historical Provider, MD  metFORMIN (GLUCOPHAGE) 500 MG tablet Take 1 tablet (500 mg total) by mouth 2 (two) times daily. 11/18/15   Cherene Altes, MD  Multiple Vitamin (MULTIVITAMIN WITH MINERALS) TABS tablet Take 1 tablet by mouth daily. 07/09/15   Florencia Reasons, MD  Omega 3 1000 MG CAPS Take 1,000 mg by mouth daily.    Historical Provider, MD  polyethylene glycol (MIRALAX / GLYCOLAX) packet Take 17 g by mouth 2 (two) times daily. 11/18/15   Cherene Altes, MD  pravastatin (PRAVACHOL) 20 MG tablet Take 1 tablet (20 mg total) by mouth daily at 6 PM. 11/18/15   Cherene Altes, MD  rivaroxaban (XARELTO) 20 MG TABS tablet Take 1 tablet (20 mg total) by mouth daily with supper. Reported on 08/12/2015 11/18/15   Cherene Altes, MD  solifenacin (VESICARE) 5 MG tablet Take 1 tablet (5 mg total) by mouth daily. 11/18/15   Cherene Altes, MD  sulfamethoxazole-trimethoprim (BACTRIM DS,SEPTRA DS) 800-160 MG tablet Take 1 tablet by mouth every 12 (twelve) hours. 11/18/15   Cherene Altes, MD  vitamin C (ASCORBIC ACID) 500 MG tablet Take 500 mg by mouth daily.    Historical Provider, MD     Vital Signs: BP 125/57 mmHg  Pulse 90  Temp(Src) 98.9 F (37.2 C) (Oral)  Resp 16  Ht  5\' 3"  (1.6 m)  Wt 140 lb 3.4 oz (63.6 kg)  BMI 24.84 kg/m2  SpO2 98%  Physical Exam  Constitutional: She is oriented to person, place, and time.  Abdominal: Soft.  Neurological: She is alert and oriented to person, place, and time.  Skin: Skin is warm and dry.  Site of L PCN clean and dry Sl tender No bleeding Output yellow urine 10 cc in bag 175 cc today  Nursing note and vitals reviewed.   Imaging: Ir US Guide Vasc Access Left  12/25/2015  INDICATION: Sepsis, urinary tract infection and obstructive uropathy on the left with central pelvic calculi and additional distal left  ureteral calculus. Left-sided percutaneous nephrostomy has been requested for decompression. IV access was lost and there has been inability to re-establish IV access for conscious sedation. EXAM: 1. ULTRASOUND-GUIDED VASCULAR ACCESS OF THE LEFT BRACHIAL VEIN WITH IV ACCESS PLACEMENT 2. LEFT PERCUTANEOUS NEPHROSTOMY TUBE PLACEMENT WITH ULTRASOUND GUIDANCE IR NEPHROSTOMY PLACEMENT LEFT; IR RADIOLOGY PERIPHERAL GUIDED IV START; IR ULTRASOUND GUIDANCE VASC ACCESS LEFT COMPARISON:  CT of the abdomen and pelvis on 12/22/2015 MEDICATIONS: 2 g IV Ancef. As antibiotic prophylaxis, Ancef was ordered pre-procedure and administered intravenously within one hour of incision. ANESTHESIA/SEDATION: Fentanyl 75 mcg IV; Versed 1.5 mg IV Moderate Sedation Time:  20 minutes. The patient was continuously monitored during the procedure by the interventional radiology nurse under my direct supervision. CONTRAST:  20 mL Isovue-300 - administered into the collecting system(s) FLUOROSCOPY TIME:  Fluoroscopy Time: 2 minutes and 12 seconds. COMPLICATIONS: None immediate. PROCEDURE: Informed written consent was obtained from the patient after a thorough discussion of the procedural risks, benefits and alternatives. All questions were addressed. Maximal Sterile Barrier Technique was utilized including caps, mask, sterile gowns, sterile gloves, sterile drape, hand hygiene and skin antiseptic. A timeout was performed prior to the initiation of the procedure. Ultrasound was used to confirm patency of the left brachial vein. Under direct ultrasound guidance, a 21 gauge needle was advanced into the vein. After securing guidewire access, a 4 French micropuncture dilator was advanced into the vein. This is secured for use for IV access. The patient was placed in a prone position and sonographic localization of the left kidney performed. Skin was prepped with chlorhexidine. Local anesthesia was provided with 1% lidocaine. Under ultrasound guidance, a  21 gauge needle was advanced into the left kidney. Contrast injection was performed under fluoroscopy. Additional access of the left kidney was then performed under ultrasound guidance with a second 21 gauge needle into the upper pole collecting system. Contrast injection was performed under fluoroscopy. A guidewire was advanced into the collecting system. A transitional dilator was placed. A guidewire was then advanced into the collecting system. Percutaneous access was dilated and a 10 French percutaneous nephrostomy tube advanced. The catheter was formed and injected with contrast material to confirm position. The catheter was then flushed and connected to a gravity drainage bag. The tube was secured at the skin with a Prolene retention suture and StatLock device. FINDINGS: Initial puncture of a dumbbell-shaped left renal cyst was performed. Contrast injection confirms that this is a large cyst with no communication with the collecting system. Two adjacent large calculi are identified in the renal pelvis. After separate upper pole access, opacification of the collecting system was accomplished. This demonstrates 2 calculi in the renal pelvis occupying most of the space of the renal pelvis. There is flow of contrast past the calculi and into the ureter. Degree of hydronephrosis is relatively  mild. The renal cysts cause splaying of the interpolar and lower pole collecting system. Via upper pole access, a nephrostomy tube was placed and advanced into the renal pelvis. IMPRESSION: 1. Ultrasound-guided venous access of the left brachial vein performed. 2. Left-sided percutaneous nephrostomy tube placement, as above. Initial access of a large dumbbell-shaped renal cyst was performed with contrast injection showing no communication with the collecting system. After upper pole access, a 10 French nephrostomy tube was placed and advanced to the renal pelvis. This was connected to a gravity drainage bag. Electronically  Signed   By: Aletta Edouard M.D.   On: 12/25/2015 16:54   Ir Radiology Peripheral Guided Iv Start  12/25/2015  INDICATION: Sepsis, urinary tract infection and obstructive uropathy on the left with central pelvic calculi and additional distal left ureteral calculus. Left-sided percutaneous nephrostomy has been requested for decompression. IV access was lost and there has been inability to re-establish IV access for conscious sedation. EXAM: 1. ULTRASOUND-GUIDED VASCULAR ACCESS OF THE LEFT BRACHIAL VEIN WITH IV ACCESS PLACEMENT 2. LEFT PERCUTANEOUS NEPHROSTOMY TUBE PLACEMENT WITH ULTRASOUND GUIDANCE IR NEPHROSTOMY PLACEMENT LEFT; IR RADIOLOGY PERIPHERAL GUIDED IV START; IR ULTRASOUND GUIDANCE VASC ACCESS LEFT COMPARISON:  CT of the abdomen and pelvis on 12/22/2015 MEDICATIONS: 2 g IV Ancef. As antibiotic prophylaxis, Ancef was ordered pre-procedure and administered intravenously within one hour of incision. ANESTHESIA/SEDATION: Fentanyl 75 mcg IV; Versed 1.5 mg IV Moderate Sedation Time:  20 minutes. The patient was continuously monitored during the procedure by the interventional radiology nurse under my direct supervision. CONTRAST:  20 mL Isovue-300 - administered into the collecting system(s) FLUOROSCOPY TIME:  Fluoroscopy Time: 2 minutes and 12 seconds. COMPLICATIONS: None immediate. PROCEDURE: Informed written consent was obtained from the patient after a thorough discussion of the procedural risks, benefits and alternatives. All questions were addressed. Maximal Sterile Barrier Technique was utilized including caps, mask, sterile gowns, sterile gloves, sterile drape, hand hygiene and skin antiseptic. A timeout was performed prior to the initiation of the procedure. Ultrasound was used to confirm patency of the left brachial vein. Under direct ultrasound guidance, a 21 gauge needle was advanced into the vein. After securing guidewire access, a 4 French micropuncture dilator was advanced into the vein. This  is secured for use for IV access. The patient was placed in a prone position and sonographic localization of the left kidney performed. Skin was prepped with chlorhexidine. Local anesthesia was provided with 1% lidocaine. Under ultrasound guidance, a 21 gauge needle was advanced into the left kidney. Contrast injection was performed under fluoroscopy. Additional access of the left kidney was then performed under ultrasound guidance with a second 21 gauge needle into the upper pole collecting system. Contrast injection was performed under fluoroscopy. A guidewire was advanced into the collecting system. A transitional dilator was placed. A guidewire was then advanced into the collecting system. Percutaneous access was dilated and a 10 French percutaneous nephrostomy tube advanced. The catheter was formed and injected with contrast material to confirm position. The catheter was then flushed and connected to a gravity drainage bag. The tube was secured at the skin with a Prolene retention suture and StatLock device. FINDINGS: Initial puncture of a dumbbell-shaped left renal cyst was performed. Contrast injection confirms that this is a large cyst with no communication with the collecting system. Two adjacent large calculi are identified in the renal pelvis. After separate upper pole access, opacification of the collecting system was accomplished. This demonstrates 2 calculi in the renal pelvis occupying most  of the space of the renal pelvis. There is flow of contrast past the calculi and into the ureter. Degree of hydronephrosis is relatively mild. The renal cysts cause splaying of the interpolar and lower pole collecting system. Via upper pole access, a nephrostomy tube was placed and advanced into the renal pelvis. IMPRESSION: 1. Ultrasound-guided venous access of the left brachial vein performed. 2. Left-sided percutaneous nephrostomy tube placement, as above. Initial access of a large dumbbell-shaped renal cyst was  performed with contrast injection showing no communication with the collecting system. After upper pole access, a 10 French nephrostomy tube was placed and advanced to the renal pelvis. This was connected to a gravity drainage bag. Electronically Signed   By: Aletta Edouard M.D.   On: 12/25/2015 16:54   Ir Nephrostomy Placement Left  12/25/2015  INDICATION: Sepsis, urinary tract infection and obstructive uropathy on the left with central pelvic calculi and additional distal left ureteral calculus. Left-sided percutaneous nephrostomy has been requested for decompression. IV access was lost and there has been inability to re-establish IV access for conscious sedation. EXAM: 1. ULTRASOUND-GUIDED VASCULAR ACCESS OF THE LEFT BRACHIAL VEIN WITH IV ACCESS PLACEMENT 2. LEFT PERCUTANEOUS NEPHROSTOMY TUBE PLACEMENT WITH ULTRASOUND GUIDANCE IR NEPHROSTOMY PLACEMENT LEFT; IR RADIOLOGY PERIPHERAL GUIDED IV START; IR ULTRASOUND GUIDANCE VASC ACCESS LEFT COMPARISON:  CT of the abdomen and pelvis on 12/22/2015 MEDICATIONS: 2 g IV Ancef. As antibiotic prophylaxis, Ancef was ordered pre-procedure and administered intravenously within one hour of incision. ANESTHESIA/SEDATION: Fentanyl 75 mcg IV; Versed 1.5 mg IV Moderate Sedation Time:  20 minutes. The patient was continuously monitored during the procedure by the interventional radiology nurse under my direct supervision. CONTRAST:  20 mL Isovue-300 - administered into the collecting system(s) FLUOROSCOPY TIME:  Fluoroscopy Time: 2 minutes and 12 seconds. COMPLICATIONS: None immediate. PROCEDURE: Informed written consent was obtained from the patient after a thorough discussion of the procedural risks, benefits and alternatives. All questions were addressed. Maximal Sterile Barrier Technique was utilized including caps, mask, sterile gowns, sterile gloves, sterile drape, hand hygiene and skin antiseptic. A timeout was performed prior to the initiation of the procedure.  Ultrasound was used to confirm patency of the left brachial vein. Under direct ultrasound guidance, a 21 gauge needle was advanced into the vein. After securing guidewire access, a 4 French micropuncture dilator was advanced into the vein. This is secured for use for IV access. The patient was placed in a prone position and sonographic localization of the left kidney performed. Skin was prepped with chlorhexidine. Local anesthesia was provided with 1% lidocaine. Under ultrasound guidance, a 21 gauge needle was advanced into the left kidney. Contrast injection was performed under fluoroscopy. Additional access of the left kidney was then performed under ultrasound guidance with a second 21 gauge needle into the upper pole collecting system. Contrast injection was performed under fluoroscopy. A guidewire was advanced into the collecting system. A transitional dilator was placed. A guidewire was then advanced into the collecting system. Percutaneous access was dilated and a 10 French percutaneous nephrostomy tube advanced. The catheter was formed and injected with contrast material to confirm position. The catheter was then flushed and connected to a gravity drainage bag. The tube was secured at the skin with a Prolene retention suture and StatLock device. FINDINGS: Initial puncture of a dumbbell-shaped left renal cyst was performed. Contrast injection confirms that this is a large cyst with no communication with the collecting system. Two adjacent large calculi are identified in the renal pelvis.  After separate upper pole access, opacification of the collecting system was accomplished. This demonstrates 2 calculi in the renal pelvis occupying most of the space of the renal pelvis. There is flow of contrast past the calculi and into the ureter. Degree of hydronephrosis is relatively mild. The renal cysts cause splaying of the interpolar and lower pole collecting system. Via upper pole access, a nephrostomy tube was  placed and advanced into the renal pelvis. IMPRESSION: 1. Ultrasound-guided venous access of the left brachial vein performed. 2. Left-sided percutaneous nephrostomy tube placement, as above. Initial access of a large dumbbell-shaped renal cyst was performed with contrast injection showing no communication with the collecting system. After upper pole access, a 10 French nephrostomy tube was placed and advanced to the renal pelvis. This was connected to a gravity drainage bag. Electronically Signed   By: Aletta Edouard M.D.   On: 12/25/2015 16:54    Labs:  CBC:  Recent Labs  12/22/15 0735 12/23/15 0219 12/25/15 0617 12/27/15 0525  WBC 14.8* 14.7* 21.5* 18.2*  HGB 8.1* 8.4* 8.3* 7.1*  HCT 24.6* 26.2* 25.3* 23.0*  PLT 353 382 522* 619*    COAGS:  Recent Labs  06/01/15 1930 07/05/15 2318 11/11/15 1447 12/25/15 0617  INR 1.10 1.14 1.86* 1.27  APTT  --  32 41* 35    BMP:  Recent Labs  12/21/15 0559 12/22/15 0735 12/23/15 0219 12/25/15 0617  NA 143 139 137 137  K 2.9* 4.3 4.0 4.2  CL 115* 108 107 107  CO2 21* 26 27 26   GLUCOSE 184* 152* 167* 130*  BUN 19 10 6  <5*  CALCIUM 9.1 9.0 9.1 9.6  CREATININE 0.67 0.59 0.51 0.52  GFRNONAA >60 >60 >60 >60  GFRAA >60 >60 >60 >60    LIVER FUNCTION TESTS:  Recent Labs  11/16/15 0334 11/17/15 0709 12/18/15 1035 12/19/15 0744  BILITOT 0.6 0.4 0.7 0.5  AST 11* 9* 15 12*  ALT 19 15 8* 7*  ALKPHOS 75 62 40 50  PROT 5.8* 6.0* 6.6 6.6  ALBUMIN 1.9* 2.0* 2.2* 2.3*    Assessment and Plan:  Left hydronephrosis/nephrolithiasis L PCN intact Will follow Plan per Urology  Electronically Signed: Erica Richwine A 12/27/2015, 9:44 AM   I spent a total of 15 Minutes at the the patient's bedside AND on the patient's hospital floor or unit, greater than 50% of which was counseling/coordinating care for L PCN

## 2015-12-28 DIAGNOSIS — T83511D Infection and inflammatory reaction due to indwelling urethral catheter, subsequent encounter: Secondary | ICD-10-CM

## 2015-12-28 DIAGNOSIS — R7881 Bacteremia: Secondary | ICD-10-CM

## 2015-12-28 DIAGNOSIS — K5669 Other intestinal obstruction: Secondary | ICD-10-CM

## 2015-12-28 LAB — CBC WITH DIFFERENTIAL/PLATELET
Basophils Absolute: 0 10*3/uL (ref 0.0–0.1)
Basophils Relative: 0 %
Eosinophils Absolute: 0.4 10*3/uL (ref 0.0–0.7)
Eosinophils Relative: 2 %
HCT: 22.9 % — ABNORMAL LOW (ref 36.0–46.0)
Hemoglobin: 7.1 g/dL — ABNORMAL LOW (ref 12.0–15.0)
Lymphocytes Relative: 24 %
Lymphs Abs: 4.2 10*3/uL — ABNORMAL HIGH (ref 0.7–4.0)
MCH: 26.6 pg (ref 26.0–34.0)
MCHC: 31 g/dL (ref 30.0–36.0)
MCV: 85.8 fL (ref 78.0–100.0)
Monocytes Absolute: 0.9 10*3/uL (ref 0.1–1.0)
Monocytes Relative: 5 %
Neutro Abs: 12.1 10*3/uL — ABNORMAL HIGH (ref 1.7–7.7)
Neutrophils Relative %: 69 %
Platelets: 724 10*3/uL — ABNORMAL HIGH (ref 150–400)
RBC: 2.67 MIL/uL — ABNORMAL LOW (ref 3.87–5.11)
RDW: 19.8 % — ABNORMAL HIGH (ref 11.5–15.5)
WBC Morphology: INCREASED
WBC: 17.6 10*3/uL — ABNORMAL HIGH (ref 4.0–10.5)

## 2015-12-28 LAB — GLUCOSE, CAPILLARY
Glucose-Capillary: 122 mg/dL — ABNORMAL HIGH (ref 65–99)
Glucose-Capillary: 105 mg/dL — ABNORMAL HIGH (ref 65–99)
Glucose-Capillary: 160 mg/dL — ABNORMAL HIGH (ref 65–99)
Glucose-Capillary: 213 mg/dL — ABNORMAL HIGH (ref 65–99)
Glucose-Capillary: 158 mg/dL — ABNORMAL HIGH (ref 65–99)

## 2015-12-28 NOTE — Care Management Important Message (Signed)
Important Message  Patient Details  Name: [name] MRN: [id number] Date of Birth: [birth year]   Medicare Important Message Given:  Yes    Nathen May 12/28/2015, 3:16 PM

## 2015-12-28 NOTE — Progress Notes (Signed)
Referring Physician(s): Dr Preston Fleeting  Supervising Physician: Dr. Corrie Mckusick  Patient Status:  Inpatient  Chief Complaint:  Left hydro/nephrolithiasis L percutaneous nephrostomy placed 7/14  Subjective: Up in bed No complaints Eating reg diet Feels better  Allergies: Review of patient's allergies indicates no known allergies.  Medications:  Current facility-administered medications:  .  albuterol (PROVENTIL) (2.5 MG/3ML) 0.083% nebulizer solution 2.5 mg, 2.5 mg, Nebulization, Q6H PRN, Bonnell Public, MD .  baclofen (LIORESAL) tablet 10 mg, 10 mg, Oral, TID, Bonnell Public, MD, 10 mg at 12/28/15 0942 .  cefTRIAXone (ROCEPHIN) 2 g in dextrose 5 % 50 mL IVPB, 2 g, Intravenous, Q24H, Collier Salina, MD, 2 g at 12/27/15 1709 .  darifenacin (ENABLEX) 24 hr tablet 7.5 mg, 7.5 mg, Oral, Daily, Bonnell Public, MD, 7.5 mg at 12/28/15 0942 .  famotidine (PEPCID) tablet 20 mg, 20 mg, Oral, Daily, Donalynn Furlong Frankenmuth, RPH, 20 mg at 12/28/15 [id number] .  feeding supplement (ENSURE ENLIVE) (ENSURE ENLIVE) liquid 237 mL, 237 mL, Oral, BID BM, Theodis Blaze, MD, 237 mL at 12/28/15 0947 .  gabapentin (NEURONTIN) capsule 100 mg, 100 mg, Oral, QHS, Bonnell Public, MD, 100 mg at 12/27/15 2125 .  HYDROcodone-acetaminophen (NORCO/VICODIN) 5-325 MG per tablet 1 tablet, 1 tablet, Oral, Q6H PRN, Bonnell Public, MD, 1 tablet at 12/27/15 2125 .  insulin aspart (novoLOG) injection 0-9 Units, 0-9 Units, Subcutaneous, Q4H, Gardiner Barefoot, NP, 2 Units at 12/28/15 1145 .  morphine 2 MG/ML injection 1 mg, 1 mg, Intravenous, Q3H PRN, Gardiner Barefoot, NP, 1 mg at 12/25/15 1702 .  multivitamin with minerals tablet 1 tablet, 1 tablet, Oral, Daily, Bonnell Public, MD, 1 tablet at 12/28/15 [id number] .  ondansetron (ZOFRAN) injection 4 mg, 4 mg, Intravenous, Q6H PRN, Lily Kocher, MD, 4 mg at 12/20/15 0656 .  polyethylene glycol (MIRALAX / GLYCOLAX) packet 17 g, 17 g, Oral, Daily,  Theodis Blaze, MD, 17 g at 12/28/15 0947 .  pravastatin (PRAVACHOL) tablet 20 mg, 20 mg, Oral, q1800, Bonnell Public, MD, 20 mg at 12/27/15 1708 .  rivaroxaban (XARELTO) tablet 20 mg, 20 mg, Oral, Q supper, Aletta Edouard, MD, 20 mg at 12/27/15 1708 .  senna-docusate (Senokot-S) tablet 1 tablet, 1 tablet, Oral, BID, Theodis Blaze, MD, 1 tablet at 12/28/15 [phone] .  sodium chloride flush (NS) 0.9 % injection 10-40 mL, 10-40 mL, Intracatheter, PRN, Bonnell Public, MD, 10 mL at 12/28/15 0413 .  vancomycin (VANCOCIN) IVPB 750 mg/150 ml premix, 750 mg, Intravenous, Q12H, Honor Loh, RPH, 750 mg at 12/28/15 0422 .  vitamin C (ASCORBIC ACID) tablet 500 mg, 500 mg, Oral, Daily, Bonnell Public, MD, 500 mg at 12/28/15 0942    Vital Signs: BP 117/64 mmHg  Pulse 95  Temp(Src) 98.7 F (37.1 C) (Oral)  Resp 15  Ht 5\' 3"  (1.6 m)  Wt 140 lb 3.4 oz (63.6 kg)  BMI 24.84 kg/m2  SpO2 96%  Physical Exam  Constitutional: She is oriented to person, place, and time.  Abdominal: Soft.  Neurological: She is alert and oriented to person, place, and time.  Skin: Skin is warm and dry.  Site of L PCN clean and dry No bleeding Output yellow urine, some debris, bag full.  Nursing note and vitals reviewed.   Imaging: Ir US Guide Vasc Access Left  12/25/2015  INDICATION: Sepsis, urinary tract infection and obstructive uropathy on the left with central pelvic calculi and additional  distal left ureteral calculus. Left-sided percutaneous nephrostomy has been requested for decompression. IV access was lost and there has been inability to re-establish IV access for conscious sedation. EXAM: 1. ULTRASOUND-GUIDED VASCULAR ACCESS OF THE LEFT BRACHIAL VEIN WITH IV ACCESS PLACEMENT 2. LEFT PERCUTANEOUS NEPHROSTOMY TUBE PLACEMENT WITH ULTRASOUND GUIDANCE IR NEPHROSTOMY PLACEMENT LEFT; IR RADIOLOGY PERIPHERAL GUIDED IV START; IR ULTRASOUND GUIDANCE VASC ACCESS LEFT COMPARISON:  CT of the abdomen and pelvis on  12/22/2015 MEDICATIONS: 2 g IV Ancef. As antibiotic prophylaxis, Ancef was ordered pre-procedure and administered intravenously within one hour of incision. ANESTHESIA/SEDATION: Fentanyl 75 mcg IV; Versed 1.5 mg IV Moderate Sedation Time:  20 minutes. The patient was continuously monitored during the procedure by the interventional radiology nurse under my direct supervision. CONTRAST:  20 mL Isovue-300 - administered into the collecting system(s) FLUOROSCOPY TIME:  Fluoroscopy Time: 2 minutes and 12 seconds. COMPLICATIONS: None immediate. PROCEDURE: Informed written consent was obtained from the patient after a thorough discussion of the procedural risks, benefits and alternatives. All questions were addressed. Maximal Sterile Barrier Technique was utilized including caps, mask, sterile gowns, sterile gloves, sterile drape, hand hygiene and skin antiseptic. A timeout was performed prior to the initiation of the procedure. Ultrasound was used to confirm patency of the left brachial vein. Under direct ultrasound guidance, a 21 gauge needle was advanced into the vein. After securing guidewire access, a 4 French micropuncture dilator was advanced into the vein. This is secured for use for IV access. The patient was placed in a prone position and sonographic localization of the left kidney performed. Skin was prepped with chlorhexidine. Local anesthesia was provided with 1% lidocaine. Under ultrasound guidance, a 21 gauge needle was advanced into the left kidney. Contrast injection was performed under fluoroscopy. Additional access of the left kidney was then performed under ultrasound guidance with a second 21 gauge needle into the upper pole collecting system. Contrast injection was performed under fluoroscopy. A guidewire was advanced into the collecting system. A transitional dilator was placed. A guidewire was then advanced into the collecting system. Percutaneous access was dilated and a 10 French percutaneous  nephrostomy tube advanced. The catheter was formed and injected with contrast material to confirm position. The catheter was then flushed and connected to a gravity drainage bag. The tube was secured at the skin with a Prolene retention suture and StatLock device. FINDINGS: Initial puncture of a dumbbell-shaped left renal cyst was performed. Contrast injection confirms that this is a large cyst with no communication with the collecting system. Two adjacent large calculi are identified in the renal pelvis. After separate upper pole access, opacification of the collecting system was accomplished. This demonstrates 2 calculi in the renal pelvis occupying most of the space of the renal pelvis. There is flow of contrast past the calculi and into the ureter. Degree of hydronephrosis is relatively mild. The renal cysts cause splaying of the interpolar and lower pole collecting system. Via upper pole access, a nephrostomy tube was placed and advanced into the renal pelvis. IMPRESSION: 1. Ultrasound-guided venous access of the left brachial vein performed. 2. Left-sided percutaneous nephrostomy tube placement, as above. Initial access of a large dumbbell-shaped renal cyst was performed with contrast injection showing no communication with the collecting system. After upper pole access, a 10 French nephrostomy tube was placed and advanced to the renal pelvis. This was connected to a gravity drainage bag. Electronically Signed   By: Aletta Edouard M.D.   On: 12/25/2015 16:54   Ir Radiology  Peripheral Guided Iv Start  12/25/2015  INDICATION: Sepsis, urinary tract infection and obstructive uropathy on the left with central pelvic calculi and additional distal left ureteral calculus. Left-sided percutaneous nephrostomy has been requested for decompression. IV access was lost and there has been inability to re-establish IV access for conscious sedation. EXAM: 1. ULTRASOUND-GUIDED VASCULAR ACCESS OF THE LEFT BRACHIAL VEIN WITH  IV ACCESS PLACEMENT 2. LEFT PERCUTANEOUS NEPHROSTOMY TUBE PLACEMENT WITH ULTRASOUND GUIDANCE IR NEPHROSTOMY PLACEMENT LEFT; IR RADIOLOGY PERIPHERAL GUIDED IV START; IR ULTRASOUND GUIDANCE VASC ACCESS LEFT COMPARISON:  CT of the abdomen and pelvis on 12/22/2015 MEDICATIONS: 2 g IV Ancef. As antibiotic prophylaxis, Ancef was ordered pre-procedure and administered intravenously within one hour of incision. ANESTHESIA/SEDATION: Fentanyl 75 mcg IV; Versed 1.5 mg IV Moderate Sedation Time:  20 minutes. The patient was continuously monitored during the procedure by the interventional radiology nurse under my direct supervision. CONTRAST:  20 mL Isovue-300 - administered into the collecting system(s) FLUOROSCOPY TIME:  Fluoroscopy Time: 2 minutes and 12 seconds. COMPLICATIONS: None immediate. PROCEDURE: Informed written consent was obtained from the patient after a thorough discussion of the procedural risks, benefits and alternatives. All questions were addressed. Maximal Sterile Barrier Technique was utilized including caps, mask, sterile gowns, sterile gloves, sterile drape, hand hygiene and skin antiseptic. A timeout was performed prior to the initiation of the procedure. Ultrasound was used to confirm patency of the left brachial vein. Under direct ultrasound guidance, a 21 gauge needle was advanced into the vein. After securing guidewire access, a 4 French micropuncture dilator was advanced into the vein. This is secured for use for IV access. The patient was placed in a prone position and sonographic localization of the left kidney performed. Skin was prepped with chlorhexidine. Local anesthesia was provided with 1% lidocaine. Under ultrasound guidance, a 21 gauge needle was advanced into the left kidney. Contrast injection was performed under fluoroscopy. Additional access of the left kidney was then performed under ultrasound guidance with a second 21 gauge needle into the upper pole collecting system. Contrast  injection was performed under fluoroscopy. A guidewire was advanced into the collecting system. A transitional dilator was placed. A guidewire was then advanced into the collecting system. Percutaneous access was dilated and a 10 French percutaneous nephrostomy tube advanced. The catheter was formed and injected with contrast material to confirm position. The catheter was then flushed and connected to a gravity drainage bag. The tube was secured at the skin with a Prolene retention suture and StatLock device. FINDINGS: Initial puncture of a dumbbell-shaped left renal cyst was performed. Contrast injection confirms that this is a large cyst with no communication with the collecting system. Two adjacent large calculi are identified in the renal pelvis. After separate upper pole access, opacification of the collecting system was accomplished. This demonstrates 2 calculi in the renal pelvis occupying most of the space of the renal pelvis. There is flow of contrast past the calculi and into the ureter. Degree of hydronephrosis is relatively mild. The renal cysts cause splaying of the interpolar and lower pole collecting system. Via upper pole access, a nephrostomy tube was placed and advanced into the renal pelvis. IMPRESSION: 1. Ultrasound-guided venous access of the left brachial vein performed. 2. Left-sided percutaneous nephrostomy tube placement, as above. Initial access of a large dumbbell-shaped renal cyst was performed with contrast injection showing no communication with the collecting system. After upper pole access, a 10 French nephrostomy tube was placed and advanced to the renal pelvis. This was  connected to a gravity drainage bag. Electronically Signed   By: Aletta Edouard M.D.   On: 12/25/2015 16:54   Ir Nephrostomy Placement Left  12/25/2015  INDICATION: Sepsis, urinary tract infection and obstructive uropathy on the left with central pelvic calculi and additional distal left ureteral calculus.  Left-sided percutaneous nephrostomy has been requested for decompression. IV access was lost and there has been inability to re-establish IV access for conscious sedation. EXAM: 1. ULTRASOUND-GUIDED VASCULAR ACCESS OF THE LEFT BRACHIAL VEIN WITH IV ACCESS PLACEMENT 2. LEFT PERCUTANEOUS NEPHROSTOMY TUBE PLACEMENT WITH ULTRASOUND GUIDANCE IR NEPHROSTOMY PLACEMENT LEFT; IR RADIOLOGY PERIPHERAL GUIDED IV START; IR ULTRASOUND GUIDANCE VASC ACCESS LEFT COMPARISON:  CT of the abdomen and pelvis on 12/22/2015 MEDICATIONS: 2 g IV Ancef. As antibiotic prophylaxis, Ancef was ordered pre-procedure and administered intravenously within one hour of incision. ANESTHESIA/SEDATION: Fentanyl 75 mcg IV; Versed 1.5 mg IV Moderate Sedation Time:  20 minutes. The patient was continuously monitored during the procedure by the interventional radiology nurse under my direct supervision. CONTRAST:  20 mL Isovue-300 - administered into the collecting system(s) FLUOROSCOPY TIME:  Fluoroscopy Time: 2 minutes and 12 seconds. COMPLICATIONS: None immediate. PROCEDURE: Informed written consent was obtained from the patient after a thorough discussion of the procedural risks, benefits and alternatives. All questions were addressed. Maximal Sterile Barrier Technique was utilized including caps, mask, sterile gowns, sterile gloves, sterile drape, hand hygiene and skin antiseptic. A timeout was performed prior to the initiation of the procedure. Ultrasound was used to confirm patency of the left brachial vein. Under direct ultrasound guidance, a 21 gauge needle was advanced into the vein. After securing guidewire access, a 4 French micropuncture dilator was advanced into the vein. This is secured for use for IV access. The patient was placed in a prone position and sonographic localization of the left kidney performed. Skin was prepped with chlorhexidine. Local anesthesia was provided with 1% lidocaine. Under ultrasound guidance, a 21 gauge needle was  advanced into the left kidney. Contrast injection was performed under fluoroscopy. Additional access of the left kidney was then performed under ultrasound guidance with a second 21 gauge needle into the upper pole collecting system. Contrast injection was performed under fluoroscopy. A guidewire was advanced into the collecting system. A transitional dilator was placed. A guidewire was then advanced into the collecting system. Percutaneous access was dilated and a 10 French percutaneous nephrostomy tube advanced. The catheter was formed and injected with contrast material to confirm position. The catheter was then flushed and connected to a gravity drainage bag. The tube was secured at the skin with a Prolene retention suture and StatLock device. FINDINGS: Initial puncture of a dumbbell-shaped left renal cyst was performed. Contrast injection confirms that this is a large cyst with no communication with the collecting system. Two adjacent large calculi are identified in the renal pelvis. After separate upper pole access, opacification of the collecting system was accomplished. This demonstrates 2 calculi in the renal pelvis occupying most of the space of the renal pelvis. There is flow of contrast past the calculi and into the ureter. Degree of hydronephrosis is relatively mild. The renal cysts cause splaying of the interpolar and lower pole collecting system. Via upper pole access, a nephrostomy tube was placed and advanced into the renal pelvis. IMPRESSION: 1. Ultrasound-guided venous access of the left brachial vein performed. 2. Left-sided percutaneous nephrostomy tube placement, as above. Initial access of a large dumbbell-shaped renal cyst was performed with contrast injection showing no communication with  the collecting system. After upper pole access, a 10 French nephrostomy tube was placed and advanced to the renal pelvis. This was connected to a gravity drainage bag. Electronically Signed   By: Aletta Edouard M.D.   On: 12/25/2015 16:54    Labs:  CBC:  Recent Labs  12/23/15 0219 12/25/15 0617 12/27/15 0525 12/28/15 0414  WBC 14.7* 21.5* 18.2* 17.6*  HGB 8.4* 8.3* 7.1* 7.1*  HCT 26.2* 25.3* 23.0* 22.9*  PLT 382 522* 619* 724*    COAGS:  Recent Labs  06/01/15 1930 07/05/15 2318 11/11/15 1447 12/25/15 0617  INR 1.10 1.14 1.86* 1.27  APTT  --  32 41* 35    BMP:  Recent Labs  12/21/15 0559 12/22/15 0735 12/23/15 0219 12/25/15 0617  NA 143 139 137 137  K 2.9* 4.3 4.0 4.2  CL 115* 108 107 107  CO2 21* 26 27 26   GLUCOSE 184* 152* 167* 130*  BUN 19 10 6  <5*  CALCIUM 9.1 9.0 9.1 9.6  CREATININE 0.67 0.59 0.51 0.52  GFRNONAA >60 >60 >60 >60  GFRAA >60 >60 >60 >60    LIVER FUNCTION TESTS:  Recent Labs  11/16/15 0334 11/17/15 0709 12/18/15 1035 12/19/15 0744  BILITOT 0.6 0.4 0.7 0.5  AST 11* 9* 15 12*  ALT 19 15 8* 7*  ALKPHOS 75 62 40 50  PROT 5.8* 6.0* 6.6 6.6  ALBUMIN 1.9* 2.0* 2.2* 2.3*    Assessment and Plan: Left hydronephrosis/nephrolithiasis L PCN intact Will follow Probable discharge in next few days, call IR with issues  Electronically Signed: Ascencion Dike 12/28/2015, 2:14 PM   I spent a total of 15 Minutes at the the patient's bedside AND on the patient's hospital floor or unit, greater than 50% of which was counseling/coordinating care for L PCN

## 2015-12-28 NOTE — Progress Notes (Addendum)
Patient ID: LANDRY TRIGLIA, female   DOB: 1961-05-03, 55 y.o.   MRN: [id number]  PROGRESS NOTE    LOGAN SICILIANO  [id number] DOB: [birth year] DOA: 12/18/2015  PCP: No primary care provider on file.   Brief Narrative:  55 year old female, chronically ill, with history of MS, decubitus ulcers, chronic kidney disease, DVT, CVA and malnutrition. Patient lives at home, has [facility] services in place.  Patient presented to 12/18/2015 with worsening creatinine from 0.54-3.32. She was subsequently found to have urinary tract infection. Her urine culture grew Providencia stuartii species. Blood culture, one set grew Providencia species and another set was positive for coagulase-negative staph species. Patient underwent left percutaneous nephrostomy tube 12/25/2015. Patient was also seen by wound care team for stage IV decubitus ulcers on sacral area. Per infectious disease, patient is supposed to finish vancomycin and Rocephin through July 21.   Assessment & Plan:  Active Problems: Sepsis secondary to UTI from indwelling foley cath and left pyelonephritis, Staph and providencia stuarti bacteremia in patient with neurogenic bladder / Leukocytosis - Urine culture from 7/7 with providencia stuarti and blood cultures from 7/7 with coag negative staph and providencia stuarti  - Repeat blood cultures 12/27/2015 show no growth for 5 days - Per infectious disease, continue vancomycin and Rocephin through 01/01/2016  - Patient is status post left percutaneous nephrostomy tube placement 12/25/2015. 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   Distended abd, adynamic Ileus, SBO - Possible adhesions and constipation -  Surgery consulted and recommended started clears and advance as tolerated.  - No indication for surgical intervention at this time .  Left-sided obstructive nephrolithiasis - Partially obstructive left-sided nephrolithiasis, with stones at the ureteropelvic junction and in the distal ureter just above the left ureterovesical junction - IR consulted and pt underwent left percutaneous nephrostomy tube placement 7/14 - Continue IV antibiotics per ID  Hypokalemia - Due to sepsis - Supplemented    Hypernatremia - Due to sepsis, dehydration - Now WNL - Check BMP in am  Anemia of chronic disease  - Low threshold to stop Xarelto if hemoglobin less than 7 - Check CBC tomorrow morning  Hx of DVT  - As noted above, low threshold to stop Xarelto if there is any evidence of bleeding or hemoglobin lower than 7   DM type II with diabetic nephropathy without long-term insulin use - Continue sliding scale insulin  MS - Pt has very limited motion in upper and lower extremities but has so far refused SNF     DVT prophylaxis: On xarelto  Code Status: full code  Family Communication: no family at the bedside this am Disposition Plan: home likely in next 2 days by 12/30/2015   Consultants:   GU  IR  SLP  ID  Surgery  PCT  Procedures:   Left percutaneous nephrostomy 12/25/2015   Antimicrobials:   Vancomycin 12/18/2015 -->  Rocephin 12/22/2015 -->  Zosyn 12/18/2015 --> 12/22/2015    Subjective: No overnight events.  Objective: Filed Vitals:   12/27/15 0540 12/27/15 1440 12/27/15 2158 12/28/15 0518  BP: 125/57 133/66 122/63 121/60  Pulse: 90 103 91 84  Temp: 98.9 F (37.2 C)  99.7 F (37.6 C) 98.2 F (36.8 C) 99.1 F (37.3 C)  TempSrc: Oral Oral  Oral  Resp: 16 20 19 17   Height:      Weight:      SpO2: 98% 92% 97% 99%    Intake/Output Summary (Last 24 hours) at 12/28/15 1058 Last data filed at 12/28/15 0930  Gross per 24 hour  Intake    440 ml  Output   2050  ml  Net  -1610 ml   Filed Weights   12/18/15 1031 12/18/15 1448  Weight: 77.111 kg (170 lb) 63.6 kg (140 lb 3.4 oz)    Examination:  General exam: Appears calm and comfortable  Respiratory system: Clear to auscultation. Respiratory effort normal. Cardiovascular system: S1 & S2 heard, Rate controlled  Gastrointestinal system: Abdomen is nondistended, soft and nontender. No organomegaly or masses felt. Normal bowel sounds heard. Central nervous system: No focal neurological deficits. Extremities: palpable pulses, no tendernes Skin: No rashes, lesions or ulcers Psychiatry: Judgement and insight appear normal. Mood & affect appropriate.   Data Reviewed: I have personally reviewed following labs and imaging studies  CBC:  Recent Labs Lab 12/22/15 0735 12/23/15 0219 12/25/15 0617 12/27/15 0525 12/28/15 0414  WBC 14.8* 14.7* 21.5* 18.2* 17.6*  NEUTROABS  --   --   --  12.1* 12.1*  HGB 8.1* 8.4* 8.3* 7.1* 7.1*  HCT 24.6* 26.2* 25.3* 23.0* 22.9*  MCV 83.7 83.4 83.5 84.6 85.8  PLT 353 382 522* 619* 123456*   Basic Metabolic Panel:  Recent Labs Lab 12/22/15 0735 12/23/15 0219 12/25/15 0617  NA 139 137 137  K 4.3 4.0 4.2  CL 108 107 107  CO2 26 27 26   GLUCOSE 152* 167* 130*  BUN 10 6 <5*  CREATININE 0.59 0.51 0.52  CALCIUM 9.0 9.1 9.6   GFR: Estimated Creatinine Clearance: 69.7 mL/min (by C-G formula based on Cr of 0.52). Liver Function Tests: No results for input(s): AST, ALT, ALKPHOS, BILITOT, PROT, ALBUMIN in the last 168 hours. No results for input(s): LIPASE, AMYLASE in the last 168 hours. No results for input(s): AMMONIA in the last 168 hours. Coagulation Profile:  Recent Labs Lab 12/25/15 0617  INR 1.27   Cardiac Enzymes: No results for input(s): CKTOTAL, CKMB, CKMBINDEX, TROPONINI in the last 168 hours. BNP (last 3 results) No results for input(s): PROBNP in the last 8760 hours. HbA1C: No results for input(s): HGBA1C in the last 72  hours. CBG:  Recent Labs Lab 12/27/15 1753 12/27/15 2037 12/27/15 2355 12/28/15 0403 12/28/15 0757  GLUCAP 166* 163* 128* 122* 105*   Lipid Profile: No results for input(s): CHOL, HDL, LDLCALC, TRIG, CHOLHDL, LDLDIRECT in the last 72 hours. Thyroid Function Tests: No results for input(s): TSH, T4TOTAL, FREET4, T3FREE, THYROIDAB in the last 72 hours. Anemia Panel: No results for input(s): VITAMINB12, FOLATE, FERRITIN, TIBC, IRON, RETICCTPCT in the last 72 hours. Urine analysis:    Component Value Date/Time   COLORURINE YELLOW 12/18/2015 1118   APPEARANCEUR TURBID* 12/18/2015 1118   LABSPEC 1.014 12/18/2015 1118   PHURINE 8.0 12/18/2015 1118   GLUCOSEU NEGATIVE 12/18/2015 1118   HGBUR MODERATE* 12/18/2015 1118   BILIRUBINUR NEGATIVE 12/18/2015 1118   KETONESUR NEGATIVE 12/18/2015 1118   PROTEINUR 100* 12/18/2015 1118   UROBILINOGEN 0.2 12/20/2014 1851   NITRITE NEGATIVE 12/18/2015 1118   LEUKOCYTESUR LARGE* 12/18/2015 1118   Sepsis Labs: @LABRCNTIP (procalcitonin:4,lacticidven:4)   Urine culture     Status: Abnormal   Collection Time: 12/18/15 11:18 AM  Result Value  Ref Range Status   Specimen Description URINE, CATHETERIZED  Final   Special Requests Normal  Final   Culture >=100,000 COLONIES/mL PROVIDENCIA STUARTII (A)  Final   Report Status 12/20/2015 FINAL  Final   Organism ID, Bacteria PROVIDENCIA STUARTII (A)  Final      Susceptibility   Providencia stuartii - MIC*    AMPICILLIN 16 RESISTANT Resistant     CEFAZOLIN >=64 RESISTANT Resistant     CEFTRIAXONE <=1 SENSITIVE Sensitive     CIPROFLOXACIN >=4 RESISTANT Resistant     GENTAMICIN >=16 RESISTANT Resistant     IMIPENEM 2 SENSITIVE Sensitive     NITROFURANTOIN 128 RESISTANT Resistant     TRIMETH/SULFA 40 SENSITIVE Sensitive     AMPICILLIN/SULBACTAM 8 SENSITIVE Sensitive     PIP/TAZO <=4 SENSITIVE Sensitive     * >=100,000 COLONIES/mL PROVIDENCIA STUARTII  Blood culture (routine x 2)     Status: Abnormal    Collection Time: 12/18/15 12:00 PM  Result Value Ref Range Status   Specimen Description BLOOD RIGHT HAND  Final   Special Requests IN PEDIATRIC BOTTLE  3CC  Final   Culture  Setup Time   Final    GRAM POSITIVE COCCI IN CLUSTERS AEROBIC BOTTLE ONLY CRITICAL RESULT CALLED TO, READ BACK BY AND VERIFIED WITH: J MARKEL,PHARMD AT 1417 12/19/15 BY L BENFIELD    Report Status 12/23/2015 FINAL  Final   Organism ID, Bacteria STAPHYLOCOCCUS SPECIES (COAGULASE NEGATIVE)  Final      Susceptibility   Staphylococcus species (coagulase negative) - MIC*    CIPROFLOXACIN <=0.5 SENSITIVE Sensitive     ERYTHROMYCIN <=0.25 SENSITIVE Sensitive     GENTAMICIN <=0.5 SENSITIVE Sensitive     OXACILLIN <=0.25 SENSITIVE Sensitive     TETRACYCLINE <=1 SENSITIVE Sensitive     VANCOMYCIN <=0.5 SENSITIVE Sensitive     TRIMETH/SULFA 20 SENSITIVE Sensitive     CLINDAMYCIN <=0.25 SENSITIVE Sensitive     RIFAMPIN <=0.5 SENSITIVE Sensitive     Inducible Clindamycin NEGATIVE Sensitive     * STAPHYLOCOCCUS SPECIES (COAGULASE NEGATIVE)  MRSA PCR Screening     Status: None   Collection Time: 12/18/15  3:00 PM  Result Value Ref Range Status   MRSA by PCR NEGATIVE NEGATIVE Final  Culture, blood (routine x 2)     Status: Abnormal   Collection Time: 12/18/15  7:57 PM  Result Value Ref Range Status   Specimen Description BLOOD LEFT ANTECUBITAL  Final   Special Requests IN PEDIATRIC BOTTLE  3CC  Final   Culture  Setup Time   Final    GRAM NEGATIVE RODS AEROBIC BOTTLE ONLY CRITICAL RESULT CALLED TO, READ BACK BY AND VERIFIED WITH: J MARKEL 12/19/15 @ 1553 M VESTAL    Culture PROVIDENCIA STUARTII (A)  Final   Report Status 12/21/2015 FINAL  Final   Organism ID, Bacteria PROVIDENCIA STUARTII  Final      Susceptibility   Providencia stuartii - MIC*    AMPICILLIN 16 RESISTANT Resistant     CEFAZOLIN >=64 RESISTANT Resistant     CEFEPIME <=1 SENSITIVE Sensitive     CEFTAZIDIME <=1 SENSITIVE Sensitive     CEFTRIAXONE <=1  SENSITIVE Sensitive     CIPROFLOXACIN >=4 RESISTANT Resistant     GENTAMICIN 8 RESISTANT Resistant     IMIPENEM 1 SENSITIVE Sensitive     TRIMETH/SULFA <=20 SENSITIVE Sensitive     AMPICILLIN/SULBACTAM 4 SENSITIVE Sensitive     PIP/TAZO <=4 SENSITIVE Sensitive     * PROVIDENCIA STUARTII  Blood  Culture ID Panel (Reflexed)     Status: None   Collection Time: 12/18/15  7:57 PM  Result Value Ref Range Status   Enterococcus species NOT DETECTED NOT DETECTED Final   Vancomycin resistance NOT DETECTED NOT DETECTED Final   Listeria monocytogenes NOT DETECTED NOT DETECTED Final   Staphylococcus species NOT DETECTED NOT DETECTED Final   Staphylococcus aureus NOT DETECTED NOT DETECTED Final   Methicillin resistance NOT DETECTED NOT DETECTED Final   Streptococcus species NOT DETECTED NOT DETECTED Final   Streptococcus agalactiae NOT DETECTED NOT DETECTED Final   Streptococcus pneumoniae NOT DETECTED NOT DETECTED Final   Streptococcus pyogenes NOT DETECTED NOT DETECTED Final   Acinetobacter baumannii NOT DETECTED NOT DETECTED Final   Enterobacteriaceae species NOT DETECTED NOT DETECTED Final   Enterobacter cloacae complex NOT DETECTED NOT DETECTED Final   Escherichia coli NOT DETECTED NOT DETECTED Final   Klebsiella oxytoca NOT DETECTED NOT DETECTED Final   Klebsiella pneumoniae NOT DETECTED NOT DETECTED Final   Proteus species NOT DETECTED NOT DETECTED Final   Serratia marcescens NOT DETECTED NOT DETECTED Final   Carbapenem resistance NOT DETECTED NOT DETECTED Final   Haemophilus influenzae NOT DETECTED NOT DETECTED Final   Neisseria meningitidis NOT DETECTED NOT DETECTED Final   Pseudomonas aeruginosa NOT DETECTED NOT DETECTED Final   Candida albicans NOT DETECTED NOT DETECTED Final   Candida glabrata NOT DETECTED NOT DETECTED Final   Candida krusei NOT DETECTED NOT DETECTED Final   Candida parapsilosis NOT DETECTED NOT DETECTED Final   Candida tropicalis NOT DETECTED NOT DETECTED Final   Culture, blood (routine x 2)     Status: Abnormal   Collection Time: 12/18/15  8:21 PM  Result Value Ref Range Status   Specimen Description BLOOD RIGHT ANTECUBITAL  Final   Special Requests IN PEDIATRIC BOTTLE  1CC  Final   Culture  Setup Time   Final   Report Status 12/22/2015 FINAL  Final   Organism ID, Bacteria STAPHYLOCOCCUS SPECIES (COAGULASE NEGATIVE)  Final      Susceptibility   Staphylococcus species (coagulase negative) - MIC*    CIPROFLOXACIN >=8 RESISTANT Resistant     ERYTHROMYCIN 0.5 SENSITIVE Sensitive     GENTAMICIN <=0.5 SENSITIVE Sensitive     OXACILLIN >=4 RESISTANT Resistant     TETRACYCLINE <=1 SENSITIVE Sensitive     VANCOMYCIN 1 SENSITIVE Sensitive     TRIMETH/SULFA 80 RESISTANT Resistant     CLINDAMYCIN <=0.25 SENSITIVE Sensitive     RIFAMPIN <=0.5 SENSITIVE Sensitive     Inducible Clindamycin NEGATIVE Sensitive     * STAPHYLOCOCCUS SPECIES (COAGULASE NEGATIVE)  Culture, blood (Routine X 2) w Reflex to ID Panel     Status: None   Collection Time: 12/22/15  4:00 PM  Result Value Ref Range Status   Specimen Description BLOOD LEFT ANTECUBITAL  Final   Special Requests BOTTLES DRAWN AEROBIC AND ANAEROBIC 10CC  Final   Culture NO GROWTH 5 DAYS  Final   Report Status 12/27/2015 FINAL  Final  Culture, blood (Routine X 2) w Reflex to ID Panel     Status: None   Collection Time: 12/22/15  4:15 PM  Result Value Ref Range Status   Specimen Description BLOOD LEFT FOREARM  Final   Special Requests BOTTLES DRAWN AEROBIC AND ANAEROBIC 10CC  Final   Culture NO GROWTH 5 DAYS  Final   Report Status 12/27/2015 FINAL  Final      Radiology Studies: Ir US Guide Vasc Access Left 12/25/2015   1.  Ultrasound-guided venous access of the left brachial vein performed. 2. Left-sided percutaneous nephrostomy tube placement, as above. Initial access of a large dumbbell-shaped renal cyst was performed with contrast injection showing no communication with the collecting system. After  upper pole access, a 10 French nephrostomy tube was placed and advanced to the renal pelvis. This was connected to a gravity drainage bag. Electronically Signed   By: Aletta Edouard M.D.   On: 12/25/2015 16:54   Ir Radiology Peripheral Guided Iv Start 12/25/2015  1. Ultrasound-guided venous access of the left brachial vein performed. 2. Left-sided percutaneous nephrostomy tube placement, as above. Initial access of a large dumbbell-shaped renal cyst was performed with contrast injection showing no communication with the collecting system. After upper pole access, a 10 French nephrostomy tube was placed and advanced to the renal pelvis. This was connected to a gravity drainage bag. Electronically Signed   By: Aletta Edouard M.D.   On: 12/25/2015 16:54   Ir Nephrostomy Placement Left 12/25/2015 1. Ultrasound-guided venous access of the left brachial vein performed. 2. Left-sided percutaneous nephrostomy tube placement, as above. Initial access of a large dumbbell-shaped renal cyst was performed with contrast injection showing no communication with the collecting system. After upper pole access, a 10 French nephrostomy tube was placed and advanced to the renal pelvis. This was connected to a gravity drainage bag. Electronically Signed   By: Aletta Edouard M.D.   On: 12/25/2015 16:54     Scheduled Meds: . baclofen  10 mg Oral TID  . cefTRIAXone (ROCEPHIN)  IV  2 g Intravenous Q24H  . darifenacin  7.5 mg Oral Daily  . famotidine  20 mg Oral Daily  . feeding supplement (ENSURE ENLIVE)  237 mL Oral BID BM  . gabapentin  100 mg Oral QHS  . insulin aspart  0-9 Units Subcutaneous Q4H  . multivitamin with minerals  1 tablet Oral Daily  . polyethylene glycol  17 g Oral Daily  . pravastatin  20 mg Oral q1800  . rivaroxaban  20 mg Oral Q supper  . senna-docusate  1 tablet Oral BID  . vancomycin  750 mg Intravenous Q12H  . vitamin C  500 mg Oral Daily   Continuous Infusions:    LOS: 10 days   Time  spent: 25 minutes  Greater than 50% of the time spent on counseling and coordinating the care.   Leisa Lenz, MD [facility] Pager [phone]  If 7PM-7AM, please contact night-coverage [email or web address] Password [facility] 12/28/2015, 10:58 AM

## 2015-12-28 NOTE — Progress Notes (Signed)
   12/28/15 1300  Clinical Encounter Type  Visited With Patient  Visit Type Follow-up;Spiritual support;Social support  Spiritual Encounters  Spiritual Needs Emotional  Stress Factors  Patient Stress Factors Health changes   Chaplain doing rounds on hall and just stopped in to visit with patient. Patient having lunch and asked Chaplain to come by later in the afternoon.  Vilinda Blanks Amadu Schlageter 12/28/2015 1:36 PM

## 2015-12-28 NOTE — Progress Notes (Signed)
Speech Language Pathology Treatment: Dysphagia  Patient Details Name: [name] MRN: [id number] DOB: [birth year] Today's Date: 12/28/2015 Time: [id number] SLP Time Calculation (min) (ACUTE ONLY): 17 min  Assessment / Plan / Recommendation Clinical Impression  Pt seen with am meal, dislikes eggs and food choices. SLP encouraged pt to try a cookie her sister brought her. Pts mastication with slow, but not prolonged and adequate. Pt continues to eat very slowly and is particular, but does not appear acutely impaired any longer. She is able to tolerate sitting upright and can feed herself without fatigue. Recommend upgrade to regular texture diet to allow pt to choose foods and increase intake. SLP will f/u for tolerance of new diet.    HPI HPI: [name] is a 55 y.o. female, chronically ill looking, with history of MS, decubitus ulcers, documented AKI and CKD, UTI, DVT, CVA and malnutrition, admitted on 12/18/2015 with sepsis, UA done suggestive of UTI.       SLP Plan  Continue with current plan of care     Recommendations  Diet recommendations: Regular;Thin liquid Liquids provided via: Cup;Straw Medication Administration: Whole meds with liquid Supervision: Intermittent supervision to cue for compensatory strategies;Patient able to self feed Compensations: Slow rate;Small sips/bites Postural Changes and/or Swallow Maneuvers: Seated upright 90 degrees             Oral Care Recommendations: Oral care BID Follow up Recommendations: None Plan: Continue with current plan of care     [place] Derrica Sieg, MA CCC-SLP [id number]  Lynann Beaver 12/28/2015, 10:46 AM

## 2015-12-28 NOTE — Care Management Note (Signed)
Case Management Note  Patient Details  Name: [name] MRN: [id number] Date of Birth: 02-Sep-1960  Subjective/Objective:                 Spoke with patient in the room. She states that her friend Gene whom she lives with cares for her all day and works third shift. While he is at work during the night, they plan for another friend Delfino Lovett to stay with her. Spoke with patient about [facility] services, she would like to use [facility]. Patient approved for [facility] services. PAtient will DC with PICC and HH IV Abx. Patient declined needing any DME. Patient has h/o MS and states she already has all the equipment she needs for now at home. Spoke with Dr Charlies Silvers about [facility] services available as pt continues to decline SNF placement. Had neph tube 7-14, following WBC, anticipate DC Tuesday per MD. Will await orders at DC.     Action/Plan:  Referral placed to [facility] for [place] services and IV Abx. Anticipate DC Tuesday.   Expected Discharge Date:                  Expected Discharge Plan:  [place]  In-House Referral:     Discharge planning Services  CM Consult  Post Acute Care Choice:  Home Health, Durable Medical Equipment Choice offered to:  Patient  DME Arranged:  IV pump/equipment DME Agency:  [facility]:  RN, PT, OT, Nurse's Aide, Social Work [employer] Agency:  [place]  Status of Service:  In process, will continue to follow  If discussed at Long Length of Stay Meetings, dates discussed:    Additional Comments:  Carles Collet, RN 12/28/2015, 2:41 PM

## 2015-12-29 DIAGNOSIS — L89154 Pressure ulcer of sacral region, stage 4: Secondary | ICD-10-CM

## 2015-12-29 DIAGNOSIS — T83511S Infection and inflammatory reaction due to indwelling urethral catheter, sequela: Secondary | ICD-10-CM

## 2015-12-29 DIAGNOSIS — A419 Sepsis, unspecified organism: Secondary | ICD-10-CM

## 2015-12-29 DIAGNOSIS — N189 Chronic kidney disease, unspecified: Secondary | ICD-10-CM

## 2015-12-29 DIAGNOSIS — N179 Acute kidney failure, unspecified: Secondary | ICD-10-CM

## 2015-12-29 LAB — CBC WITH DIFFERENTIAL/PLATELET
Basophils Absolute: 0 10*3/uL (ref 0.0–0.1)
Basophils Relative: 0 %
Eosinophils Absolute: 0.3 10*3/uL (ref 0.0–0.7)
Eosinophils Relative: 2 %
HCT: 23 % — ABNORMAL LOW (ref 36.0–46.0)
Hemoglobin: 7 g/dL — ABNORMAL LOW (ref 12.0–15.0)
Lymphocytes Relative: 21 %
Lymphs Abs: 3.4 10*3/uL (ref 0.7–4.0)
MCH: 26 pg (ref 26.0–34.0)
MCHC: 30.4 g/dL (ref 30.0–36.0)
MCV: 85.5 fL (ref 78.0–100.0)
Monocytes Absolute: 1.1 10*3/uL — ABNORMAL HIGH (ref 0.1–1.0)
Monocytes Relative: 7 %
Neutro Abs: 11.4 10*3/uL — ABNORMAL HIGH (ref 1.7–7.7)
Neutrophils Relative %: 70 %
Platelets: 809 10*3/uL — ABNORMAL HIGH (ref 150–400)
RBC: 2.69 MIL/uL — ABNORMAL LOW (ref 3.87–5.11)
RDW: 19.6 % — ABNORMAL HIGH (ref 11.5–15.5)
WBC: 16.2 10*3/uL — ABNORMAL HIGH (ref 4.0–10.5)

## 2015-12-29 LAB — BASIC METABOLIC PANEL
Anion gap: 5 (ref 5–15)
BUN: 12 mg/dL (ref 6–20)
CO2: 28 mmol/L (ref 22–32)
Calcium: 10.2 mg/dL (ref 8.9–10.3)
Chloride: 104 mmol/L (ref 101–111)
Creatinine, Ser: 0.61 mg/dL (ref 0.44–1.00)
GFR calc Af Amer: >60 mL/min (ref >60)
GFR calc non Af Amer: >60 mL/min (ref >60)
Glucose, Bld: 195 mg/dL — ABNORMAL HIGH (ref 65–99)
Potassium: 3.9 mmol/L (ref 3.5–5.1)
Sodium: 137 mmol/L (ref 135–145)

## 2015-12-29 LAB — GLUCOSE, CAPILLARY
Glucose-Capillary: 116 mg/dL — ABNORMAL HIGH (ref 65–99)
Glucose-Capillary: 129 mg/dL — ABNORMAL HIGH (ref 65–99)
Glucose-Capillary: 165 mg/dL — ABNORMAL HIGH (ref 65–99)
Glucose-Capillary: 171 mg/dL — ABNORMAL HIGH (ref 65–99)
Glucose-Capillary: 174 mg/dL — ABNORMAL HIGH (ref 65–99)
Glucose-Capillary: 191 mg/dL — ABNORMAL HIGH (ref 65–99)

## 2015-12-29 LAB — VANCOMYCIN, TROUGH: VANCOMYCIN TR: 20 ug/mL (ref 15–20)

## 2015-12-29 NOTE — Progress Notes (Signed)
Patient ID: [name], female   DOB: [birth year], 55 y.o.   MRN: [id number]  PROGRESS NOTE    [name]  [id number] DOB: [birth year] DOA: 12/18/2015  PCP: No primary care provider on file.   Brief Narrative:  55 year old female, chronically ill, with history of MS, decubitus ulcers, chronic kidney disease, DVT, CVA and malnutrition. Patient lives at home, has [facility] services in place.  Patient presented to 12/18/2015 with worsening creatinine from 0.54-3.32. She was subsequently found to have urinary tract infection. Her urine culture grew Providencia stuartii species. Blood culture, one set grew Providencia species and another set was positive for coagulase-negative staph species. Patient underwent left percutaneous nephrostomy tube 12/25/2015. Patient was also seen by wound care team for stage IV decubitus ulcers on sacral area. Per infectious disease, patient is supposed to finish vancomycin and Rocephin through July 21.   Assessment & Plan:  Active Problems: Sepsis secondary to UTI from indwelling foley cath and left pyelonephritis, Staph and providencia stuarti bacteremia in patient with neurogenic bladder / Leukocytosis - Urine culture from 7/7 with providencia stuarti and blood cultures from 7/7 with coag negative staph and providencia stuarti  - Repeat blood cultures 12/27/2015 show no growth for 5 days - Per infectious disease, continue vancomycin and Rocephin through 01/01/2016  - Patient is status post left percutaneous nephrostomy tube placement 12/25/2015. 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  - No indication for surgical intervention at this time .  Left-sided obstructive nephrolithiasis - Partially obstructive left-sided nephrolithiasis, with stones at the ureteropelvic junction and in the distal ureter just above the left ureterovesical junction - IR consulted and pt underwent left percutaneous nephrostomy tube placement 7/14. 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  Objective: Filed Vitals:   12/28/15 0518 12/28/15 1321 12/28/15 2032 12/29/15 0620  BP: 121/60  117/64 145/76 116/70  Pulse: 84 95 96 94  Temp: 99.1 F (37.3 C) 98.7 F (37.1 C) 98.9 F (37.2 C) 98.4 F (36.9 C)  TempSrc: Oral Oral Oral   Resp: 17 15 19 16   Height:      Weight:      SpO2: 99% 96% 98% 97%    Intake/Output Summary (Last 24 hours) at  12/29/15 1053 Last data filed at 12/29/15 0830  Gross per 24 hour  Intake    580 ml  Output   2925 ml  Net  -2345 ml   Filed Weights   12/18/15 1031 12/18/15 1448  Weight: 77.111 kg (170 lb) 63.6 kg (140 lb 3.4 oz)    Examination:  General exam: More alert, no distress Respiratory system: Respiratory effort normal. No wheezing  Cardiovascular system: S1 & S2 heard, RRR Gastrointestinal system: (+) BS, no tender  Central nervous system: Nonfocal Extremities: palpable pulses, unna boots on Skin: war, dry  Psychiatry: Mood & affect appropriate.   Data Reviewed: I have personally reviewed following labs and imaging studies  CBC:  Recent Labs Lab 12/23/15 0219 12/25/15 0617 12/27/15 0525 12/28/15 0414  WBC 14.7* 21.5* 18.2* 17.6*  NEUTROABS  --   --  12.1* 12.1*  HGB 8.4* 8.3* 7.1* 7.1*  HCT 26.2* 25.3* 23.0* 22.9*  MCV 83.4 83.5 84.6 85.8  PLT 382 522* 619* 123456*   Basic Metabolic Panel:  Recent Labs Lab 12/23/15 0219 12/25/15 0617  NA 137 137  K 4.0 4.2  CL 107 107  CO2 27 26  GLUCOSE 167* 130*  BUN 6 <5*  CREATININE 0.51 0.52  CALCIUM 9.1 9.6   GFR: Estimated Creatinine Clearance: 69.7 mL/min (by C-G formula based on Cr of 0.52). Liver Function Tests: No results for input(s): AST, ALT, ALKPHOS, BILITOT, PROT, ALBUMIN in the last 168 hours. No results for input(s): LIPASE, AMYLASE in the last 168 hours. No results for input(s): AMMONIA in the last 168 hours. Coagulation Profile:  Recent Labs Lab 12/25/15 0617  INR 1.27   Cardiac Enzymes: No results for input(s): CKTOTAL, CKMB, CKMBINDEX, TROPONINI in the last 168 hours. BNP (last 3 results) No results for input(s): PROBNP in the last 8760 hours. HbA1C: No results for input(s): HGBA1C in the last 72 hours. CBG:  Recent Labs Lab 12/28/15 1706 12/28/15 1956 12/29/15 0018 12/29/15 0359 12/29/15 0805  GLUCAP 213* 158* 165* 116* 129*   Lipid Profile: No results for input(s): CHOL, HDL,  LDLCALC, TRIG, CHOLHDL, LDLDIRECT in the last 72 hours. Thyroid Function Tests: No results for input(s): TSH, T4TOTAL, FREET4, T3FREE, THYROIDAB in the last 72 hours. Anemia Panel: No results for input(s): VITAMINB12, FOLATE, FERRITIN, TIBC, IRON, RETICCTPCT in the last 72 hours. Urine analysis:    Component Value Date/Time   COLORURINE YELLOW 12/18/2015 1118   APPEARANCEUR TURBID* 12/18/2015 1118   LABSPEC 1.014 12/18/2015 1118   PHURINE 8.0 12/18/2015 1118   GLUCOSEU NEGATIVE 12/18/2015 1118   HGBUR MODERATE* 12/18/2015 1118   BILIRUBINUR NEGATIVE 12/18/2015 1118   KETONESUR NEGATIVE 12/18/2015 1118   PROTEINUR 100* 12/18/2015 1118   UROBILINOGEN 0.2 12/20/2014 1851   NITRITE NEGATIVE 12/18/2015 1118   LEUKOCYTESUR LARGE* 12/18/2015 1118   Sepsis Labs: @LABRCNTIP (procalcitonin:4,lacticidven:4)   Urine culture     Status: Abnormal   Collection Time: 12/18/15 11:18 AM  Result Value Ref Range Status   Specimen Description URINE, CATHETERIZED  Final   Special Requests Normal  Final   Culture >=100,000 COLONIES/mL PROVIDENCIA STUARTII (A)  Final   Report Status 12/20/2015 FINAL  Final   Organism ID, Bacteria PROVIDENCIA STUARTII (A)  Final      Susceptibility   Providencia stuartii - MIC*    AMPICILLIN 16 RESISTANT Resistant     CEFAZOLIN >=64 RESISTANT Resistant     CEFTRIAXONE <=1 SENSITIVE Sensitive     CIPROFLOXACIN >=4 RESISTANT Resistant     GENTAMICIN >=16 RESISTANT Resistant     IMIPENEM 2 SENSITIVE Sensitive     NITROFURANTOIN 128 RESISTANT Resistant     TRIMETH/SULFA 40 SENSITIVE Sensitive     AMPICILLIN/SULBACTAM 8 SENSITIVE Sensitive     PIP/TAZO <=4 SENSITIVE Sensitive     * >=100,000 COLONIES/mL PROVIDENCIA STUARTII  Blood culture (routine x 2)     Status: Abnormal   Collection Time: 12/18/15 12:00 PM  Result Value Ref Range Status   Specimen Description BLOOD RIGHT HAND  Final   Special Requests IN PEDIATRIC BOTTLE  3CC  Final   Culture  Setup Time    Final    GRAM POSITIVE COCCI IN CLUSTERS AEROBIC BOTTLE ONLY CRITICAL RESULT CALLED TO, READ BACK BY AND VERIFIED WITH: J MARKEL,PHARMD AT 1417 12/19/15 BY L BENFIELD    Report Status 12/23/2015 FINAL  Final   Organism ID, Bacteria STAPHYLOCOCCUS SPECIES (COAGULASE NEGATIVE)  Final      Susceptibility   Staphylococcus species (coagulase negative) - MIC*    CIPROFLOXACIN <=0.5 SENSITIVE Sensitive     ERYTHROMYCIN <=0.25 SENSITIVE Sensitive     GENTAMICIN <=0.5 SENSITIVE Sensitive     OXACILLIN <=0.25 SENSITIVE Sensitive     TETRACYCLINE <=1 SENSITIVE Sensitive     VANCOMYCIN <=0.5 SENSITIVE Sensitive     TRIMETH/SULFA 20 SENSITIVE Sensitive     CLINDAMYCIN <=0.25 SENSITIVE Sensitive     RIFAMPIN <=0.5 SENSITIVE Sensitive     Inducible Clindamycin NEGATIVE Sensitive     * STAPHYLOCOCCUS SPECIES (COAGULASE NEGATIVE)  MRSA PCR Screening     Status: None   Collection Time: 12/18/15  3:00 PM  Result Value Ref Range Status   MRSA by PCR NEGATIVE NEGATIVE Final  Culture, blood (routine x 2)     Status: Abnormal   Collection Time: 12/18/15  7:57 PM  Result Value Ref Range Status   Specimen Description BLOOD LEFT ANTECUBITAL  Final   Special Requests IN PEDIATRIC BOTTLE  3CC  Final   Culture  Setup Time   Final    GRAM NEGATIVE RODS AEROBIC BOTTLE ONLY CRITICAL RESULT CALLED TO, READ BACK BY AND VERIFIED WITH: J MARKEL 12/19/15 @ 1553 M VESTAL    Culture PROVIDENCIA STUARTII (A)  Final   Report Status 12/21/2015 FINAL  Final   Organism ID, Bacteria PROVIDENCIA STUARTII  Final      Susceptibility   Providencia stuartii - MIC*    AMPICILLIN 16 RESISTANT Resistant     CEFAZOLIN >=64 RESISTANT Resistant     CEFEPIME <=1 SENSITIVE Sensitive     CEFTAZIDIME <=1 SENSITIVE Sensitive     CEFTRIAXONE <=1 SENSITIVE Sensitive     CIPROFLOXACIN >=4 RESISTANT Resistant     GENTAMICIN 8 RESISTANT Resistant     IMIPENEM 1 SENSITIVE Sensitive     TRIMETH/SULFA <=20 SENSITIVE Sensitive      AMPICILLIN/SULBACTAM 4 SENSITIVE Sensitive     PIP/TAZO <=4 SENSITIVE Sensitive     * PROVIDENCIA STUARTII  Blood Culture ID Panel (Reflexed)     Status: None   Collection Time: 12/18/15  7:57 PM  Result Value Ref Range Status   Enterococcus  species NOT DETECTED NOT DETECTED Final   Vancomycin resistance NOT DETECTED NOT DETECTED Final   Listeria monocytogenes NOT DETECTED NOT DETECTED Final   Staphylococcus species NOT DETECTED NOT DETECTED Final   Staphylococcus aureus NOT DETECTED NOT DETECTED Final   Methicillin resistance NOT DETECTED NOT DETECTED Final   Streptococcus species NOT DETECTED NOT DETECTED Final   Streptococcus agalactiae NOT DETECTED NOT DETECTED Final   Streptococcus pneumoniae NOT DETECTED NOT DETECTED Final   Streptococcus pyogenes NOT DETECTED NOT DETECTED Final   Acinetobacter baumannii NOT DETECTED NOT DETECTED Final   Enterobacteriaceae species NOT DETECTED NOT DETECTED Final   Enterobacter cloacae complex NOT DETECTED NOT DETECTED Final   Escherichia coli NOT DETECTED NOT DETECTED Final   Klebsiella oxytoca NOT DETECTED NOT DETECTED Final   Klebsiella pneumoniae NOT DETECTED NOT DETECTED Final   Proteus species NOT DETECTED NOT DETECTED Final   Serratia marcescens NOT DETECTED NOT DETECTED Final   Carbapenem resistance NOT DETECTED NOT DETECTED Final   Haemophilus influenzae NOT DETECTED NOT DETECTED Final   Neisseria meningitidis NOT DETECTED NOT DETECTED Final   Pseudomonas aeruginosa NOT DETECTED NOT DETECTED Final   Candida albicans NOT DETECTED NOT DETECTED Final   Candida glabrata NOT DETECTED NOT DETECTED Final   Candida krusei NOT DETECTED NOT DETECTED Final   Candida parapsilosis NOT DETECTED NOT DETECTED Final   Candida tropicalis NOT DETECTED NOT DETECTED Final  Culture, blood (routine x 2)     Status: Abnormal   Collection Time: 12/18/15  8:21 PM  Result Value Ref Range Status   Specimen Description BLOOD RIGHT ANTECUBITAL  Final   Special  Requests IN PEDIATRIC BOTTLE  1CC  Final   Culture  Setup Time   Final   Report Status 12/22/2015 FINAL  Final   Organism ID, Bacteria STAPHYLOCOCCUS SPECIES (COAGULASE NEGATIVE)  Final      Susceptibility   Staphylococcus species (coagulase negative) - MIC*    CIPROFLOXACIN >=8 RESISTANT Resistant     ERYTHROMYCIN 0.5 SENSITIVE Sensitive     GENTAMICIN <=0.5 SENSITIVE Sensitive     OXACILLIN >=4 RESISTANT Resistant     TETRACYCLINE <=1 SENSITIVE Sensitive     VANCOMYCIN 1 SENSITIVE Sensitive     TRIMETH/SULFA 80 RESISTANT Resistant     CLINDAMYCIN <=0.25 SENSITIVE Sensitive     RIFAMPIN <=0.5 SENSITIVE Sensitive     Inducible Clindamycin NEGATIVE Sensitive     * STAPHYLOCOCCUS SPECIES (COAGULASE NEGATIVE)  Culture, blood (Routine X 2) w Reflex to ID Panel     Status: None   Collection Time: 12/22/15  4:00 PM  Result Value Ref Range Status   Specimen Description BLOOD LEFT ANTECUBITAL  Final   Special Requests BOTTLES DRAWN AEROBIC AND ANAEROBIC 10CC  Final   Culture NO GROWTH 5 DAYS  Final   Report Status 12/27/2015 FINAL  Final  Culture, blood (Routine X 2) w Reflex to ID Panel     Status: None   Collection Time: 12/22/15  4:15 PM  Result Value Ref Range Status   Specimen Description BLOOD LEFT FOREARM  Final   Special Requests BOTTLES DRAWN AEROBIC AND ANAEROBIC 10CC  Final   Culture NO GROWTH 5 DAYS  Final   Report Status 12/27/2015 FINAL  Final      Radiology Studies: Ir US Guide Vasc Access Left 12/25/2015   1. Ultrasound-guided venous access of the left brachial vein performed. 2. Left-sided percutaneous nephrostomy tube placement, as above. Initial access of a large dumbbell-shaped renal cyst was performed  with contrast injection showing no communication with the collecting system. After upper pole access, a 10 French nephrostomy tube was placed and advanced to the renal pelvis. This was connected to a gravity drainage bag. Electronically Signed   By: Aletta Edouard M.D.    On: 12/25/2015 16:54   Ir Radiology Peripheral Guided Iv Start 12/25/2015  1. Ultrasound-guided venous access of the left brachial vein performed. 2. Left-sided percutaneous nephrostomy tube placement, as above. Initial access of a large dumbbell-shaped renal cyst was performed with contrast injection showing no communication with the collecting system. After upper pole access, a 10 French nephrostomy tube was placed and advanced to the renal pelvis. This was connected to a gravity drainage bag. Electronically Signed   By: Aletta Edouard M.D.   On: 12/25/2015 16:54   Ir Nephrostomy Placement Left 12/25/2015 1. Ultrasound-guided venous access of the left brachial vein performed. 2. Left-sided percutaneous nephrostomy tube placement, as above. Initial access of a large dumbbell-shaped renal cyst was performed with contrast injection showing no communication with the collecting system. After upper pole access, a 10 French nephrostomy tube was placed and advanced to the renal pelvis. This was connected to a gravity drainage bag. Electronically Signed   By: Aletta Edouard M.D.   On: 12/25/2015 16:54     Scheduled Meds: . baclofen  10 mg Oral TID  . cefTRIAXone (ROCEPHIN)  IV  2 g Intravenous Q24H  . darifenacin  7.5 mg Oral Daily  . famotidine  20 mg Oral Daily  . feeding supplement (ENSURE ENLIVE)  237 mL Oral BID BM  . gabapentin  100 mg Oral QHS  . insulin aspart  0-9 Units Subcutaneous Q4H  . multivitamin with minerals  1 tablet Oral Daily  . polyethylene glycol  17 g Oral Daily  . pravastatin  20 mg Oral q1800  . rivaroxaban  20 mg Oral Q supper  . senna-docusate  1 tablet Oral BID  . vancomycin  750 mg Intravenous Q12H  . vitamin C  500 mg Oral Daily   Continuous Infusions:    LOS: 11 days   Time spent: 15 minutes  Greater than 50% of the time spent on counseling and coordinating the care.   Leisa Lenz, MD [facility] Pager [phone]  If 7PM-7AM, please contact  night-coverage [email or web address] Password [facility] 12/29/2015, 10:53 AM

## 2015-12-29 NOTE — Progress Notes (Signed)
Pharmacy Antibiotic Note  [name] is a 55 y.o. female admitted on 12/18/2015 with sepsis secondary to UTI and left pyelonephritis. Pharmacy has been consulted for ceftriaxone and vancomycin dosing. Today is Day 12 of antibiotics: ceftriaxone for Providencia UTI and vancomycin for MSSE/MRSE bacteremia.   Per ID, both antibiotics to continue for at least 14 days, or until the removal of obstructive L-renal stones; planned stop date is 7/21. Nephrostomy was placed 7/14. WBC downtrending; will continue to monitor. Last vancomycin trough was drawn appropriately and therapeutic at 20 mcg/mL on 7/18.   Plan: - Continue Ceftriaxone 2g IV q24h - Continue Vancomycin 750mg  IV q12h. Goal trough of 15-20 mcg/mL - Will continue to monitor CBC, Scr, and s/sx's of infection - BMP pending - will f/u   Height: 5\' 3"  (160 cm) Weight: 140 lb 3.4 oz (63.6 kg) IBW/kg (Calculated) : 52.4  Temp (24hrs), Avg:98.7 F (37.1 C), Min:98.4 F (36.9 C), Max:98.9 F (37.2 C)   Recent Labs Lab 12/23/15 0219 12/25/15 0617 12/27/15 0525 12/28/15 0414 12/29/15 0230  WBC 14.7* 21.5* 18.2* 17.6*  --   CREATININE 0.51 0.52  --   --   --   VANCOTROUGH 17  --   --   --  20    Estimated Creatinine Clearance: 69.7 mL/min (by C-G formula based on Cr of 0.52).    No Known Allergies  Antimicrobials this admission: Ceftriaxone 7/11 >> Vancomycin 7/7 >>  Zosyn  7/7 >> 7/11  Microbiology results: 7/7 BCx x2 1035: methicillin resistant CoNS (both bottles) 7/7 BCx x2 1200: GPC clusters 1/2 (final cx coag negative staph) 7/7 BCx x2 1957: Providencia stuartii 1/2 (S-Un/Zosyn, CTX, Imi, SMX/TMP) 7/7 BCx x2 2021: Coag negative staph 1/2 (pan-sensitive except R-Cipro) 7/7 UCx: > 100k of Providencia stuartii (S-Unasyn, CTX, imi, Zosyn, Bactrim) 7/7 MRSA PCR: negative 7/11 BCx x2: NGTD 7/111 BCx2: NGTD  Thank you for allowing pharmacy to be a part of this patient's care.  Argie Ramming, PharmD Pharmacy  Resident Pager [phone]

## 2015-12-30 ENCOUNTER — Inpatient Hospital Stay (HOSPITAL_COMMUNITY): Payer: Medicare Other

## 2015-12-30 DIAGNOSIS — D638 Anemia in other chronic diseases classified elsewhere: Secondary | ICD-10-CM

## 2015-12-30 LAB — CBC
HEMATOCRIT: 21.3 % — AB (ref 36.0–46.0)
HEMOGLOBIN: 6.6 g/dL — AB (ref 12.0–15.0)
MCH: 26.4 pg (ref 26.0–34.0)
MCHC: 31 g/dL (ref 30.0–36.0)
MCV: 85.2 fL (ref 78.0–100.0)
Platelets: 818 10*3/uL — ABNORMAL HIGH (ref 150–400)
RBC: 2.5 MIL/uL — ABNORMAL LOW (ref 3.87–5.11)
RDW: 19.5 % — ABNORMAL HIGH (ref 11.5–15.5)
WBC: 16.7 10*3/uL — ABNORMAL HIGH (ref 4.0–10.5)

## 2015-12-30 LAB — HEMOGLOBIN AND HEMATOCRIT, BLOOD
HCT: 28.8 % — ABNORMAL LOW (ref 36.0–46.0)
Hemoglobin: 9.4 g/dL — ABNORMAL LOW (ref 12.0–15.0)

## 2015-12-30 LAB — GLUCOSE, CAPILLARY
Glucose-Capillary: 129 mg/dL — ABNORMAL HIGH (ref 65–99)
Glucose-Capillary: 219 mg/dL — ABNORMAL HIGH (ref 65–99)
Glucose-Capillary: 156 mg/dL — ABNORMAL HIGH (ref 65–99)
Glucose-Capillary: 163 mg/dL — ABNORMAL HIGH (ref 65–99)

## 2015-12-30 LAB — PREPARE RBC (CROSSMATCH)

## 2015-12-30 MED ORDER — SENNOSIDES-DOCUSATE SODIUM 8.6-50 MG PO TABS: 1.0000 | ORAL_TABLET | Freq: Two times a day (BID) | ORAL | Status: DC

## 2015-12-30 MED ORDER — ALBUTEROL SULFATE (2.5 MG/3ML) 0.083% IN NEBU: 2.5000 mg | INHALATION_SOLUTION | Freq: Four times a day (QID) | RESPIRATORY_TRACT | Status: AC | PRN

## 2015-12-30 MED ORDER — VANCOMYCIN HCL IN DEXTROSE 750-5 MG/150ML-% IV SOLN
750.0000 mg | Freq: Two times a day (BID) | INTRAVENOUS | Status: DC
Start: 2015-12-30 — End: 2016-01-04

## 2015-12-30 MED ORDER — SODIUM CHLORIDE 0.9 % IV SOLN: Freq: Once | INTRAVENOUS | Status: DC

## 2015-12-30 MED ORDER — SODIUM CHLORIDE 0.9 % IV SOLN
Freq: Once | INTRAVENOUS | Status: AC
  Administered 2015-12-30: 10:00:00 via INTRAVENOUS

## 2015-12-30 MED ORDER — DEXTROSE 5 % IV SOLN: 2.0000 g | INTRAVENOUS | Status: DC

## 2015-12-30 MED ORDER — HEPARIN SOD (PORK) LOCK FLUSH 100 UNIT/ML IV SOLN
250.0000 [IU] | INTRAVENOUS | Status: AC | PRN
  Administered 2015-12-30: 250 [IU]

## 2015-12-30 MED ORDER — ENSURE ENLIVE PO LIQD: 237.0000 mL | Freq: Two times a day (BID) | ORAL | Status: DC

## 2015-12-30 NOTE — Progress Notes (Signed)
Paged MD for order for type and cross.

## 2015-12-30 NOTE — Progress Notes (Signed)
Speech Language Pathology Treatment: Dysphagia  Patient Details Name: [name] MRN: [id number] DOB: July 05, 1960 Today's Date: 12/30/2015 Time: 0141-0301 SLP Time Calculation (min) (ACUTE ONLY): 25 min  Assessment / Plan / Recommendation Clinical Impression  Visited pt at am meal, pt again demonstrated adequate mastication of regular solids. Though her rate of intake is slow it is not inappropriate and mastication is not prolonged. Reinforced basic precautions with pt and RN and assisted pt in ordering foods of choice. No SLP f/u needed will sign off.    HPI HPI: [name] is a 55 y.o. female, chronically ill looking, with history of MS, decubitus ulcers, documented AKI and CKD, UTI, DVT, CVA and malnutrition, admitted on 12/18/2015 with sepsis, UA done suggestive of UTI.       SLP Plan  All goals met     Recommendations  Diet recommendations: Regular;Thin liquid Liquids provided via: Cup;Straw Medication Administration: Whole meds with liquid Supervision: Intermittent supervision to cue for compensatory strategies;Patient able to self feed Compensations: Slow rate;Small sips/bites Postural Changes and/or Swallow Maneuvers: Seated upright 90 degrees             Oral Care Recommendations: Oral care BID Follow up Recommendations: None Plan: All goals met     GO                [name], Katherene Ponto 12/30/2015, 10:38 AM

## 2015-12-30 NOTE — Clinical Social Work Placement (Signed)
   CLINICAL SOCIAL WORK PLACEMENT  NOTE  Date:  12/30/2015  Patient Details  Name: [name] MRN: [id number] Date of Birth: [birth year]  Clinical Social Work is seeking post-discharge placement for this patient at the [place] level of care (*CSW will initial, date and re-position this form in  chart as items are completed):  Yes   Patient/family provided with [place] Work Department's list of facilities offering this level of care within the geographic area requested by the patient (or if unable, by the patient's family).  Yes   Patient/family informed of their freedom to choose among providers that offer the needed level of care, that participate in Medicare, Medicaid or managed care program needed by the patient, have an available bed and are willing to accept the patient.  Yes   Patient/family informed of [place]'s ownership interest in [facility] and [facility], as well as of the fact that they are under no obligation to receive care at these facilities.  PASRR submitted to EDS on       PASRR number received on       Existing PASRR number confirmed on 12/30/15     FL2 transmitted to all facilities in geographic area requested by pt/family on 12/30/15     FL2 transmitted to all facilities within larger geographic area on       Patient informed that his/her managed care company has contracts with or will negotiate with certain facilities, including the following:        Yes   Patient/family informed of bed offers received.  Patient chooses bed at [facility]     Physician recommends and patient chooses bed at      Patient to be transferred to [facility] on 12/30/15.  Patient to be transferred to facility by [facility]     Patient family notified on 12/30/15 of transfer.  Name of family member notified:  [name]     PHYSICIAN       Additional Comment:    _______________________________________________ Benard Halsted, [place] 12/30/2015, 4:38 PM

## 2015-12-30 NOTE — Clinical Social Work Note (Signed)
Clinical Social Work Assessment  Patient Details  Name: [name] MRN: [id number] Date of Birth: 1961-01-10  Date of referral:  12/30/15               Reason for consult:  Facility Placement                Permission sought to share information with:  Facility Sport and exercise psychologist, Family Supports Permission granted to share information::  Yes, Verbal Permission Granted  Name::     Interior and spatial designer::  SNFs  Relationship::  Cousin  Contact Information:  [phone]  Housing/Transportation Living arrangements for the past 2 months:  [place] of Information:  Patient, Other (Comment Required) [name]) Patient Interpreter Needed:  None Criminal Activity/Legal Involvement Pertinent to Current Situation/Hospitalization:  No - Comment as needed Significant Relationships:  Friend, Other Family Members Lives with:  Self Do you feel safe going back to the place where you live?  No Need for family participation in patient care:  No (Coment)  Care giving concerns:  CSW received referral for possible SNF placement at time of discharge. CSW met with patient regarding recommendation of SNF placement at time of discharge. Per patient, patient is currently unable to care for patient at home given patient's current physical needs. Patient and [name] expressed understanding of recommendation and are agreeable to SNF placement at time of discharge. CSW to continue to follow and assist with discharge planning needs.   Social Worker assessment / plan:  CSW spoke with patient and [name] concerning possibility of rehab at [facility] before returning home.  Employment status:  Disabled (Comment on whether or not currently receiving Disability) Insurance information:  Managed Medicare PT Recommendations:  Not assessed at this time Information / Referral to community resources:  [place]  Patient/Family's Response to care:  Patient and [name] recognize need for rehab before  returning home and are agreeable to a SNF in [place]. Patient reported preference for [facility] or [facility]  Patient/Family's Understanding of and Emotional Response to Diagnosis, Current Treatment, and Prognosis:  Patient is realistic regarding therapy needs. No questions/concerns about plan or treatment.    Emotional Assessment Appearance:  Appears stated age Attitude/Demeanor/Rapport:  Other (Appropriate) Affect (typically observed):  Accepting, Appropriate, Quiet Orientation:  Oriented to Self, Oriented to Place, Oriented to Situation, Oriented to  Time Alcohol / Substance use:  Not Applicable Psych involvement (Current and /or in the community):  No (Comment)  Discharge Needs  Concerns to be addressed:  Care Coordination Readmission within the last 30 days:  No Current discharge risk:  None Barriers to Discharge:  No Barriers Identified   [name], [name] 12/30/2015, 11:35 AM

## 2015-12-30 NOTE — Progress Notes (Signed)
H & H result (9.4/28.8)reults relayed to [facility] in [facility]

## 2015-12-30 NOTE — Discharge Summary (Signed)
Physician Discharge Summary  [name] [id number] DOB: Jul 12, 1960 DOA: 12/18/2015   PCP: No primary care provider on file. MD in SNF  Admit date: 12/18/2015 Discharge date: 12/30/2015  Recommendations for Outpatient Follow-up:  Continue Rocephin and vancomycin for 3 more days on discharge. Rocephin is 1 day regimen and vancomycin is twice a day regimen. Please consider resuming Lasix if blood pressure is stable. Patient's blood pressure is 114/62. She is on lisinopril. Her Lasix dose is usually 40 mg twice a day. If blood pressure is at least 130/85 and Lasix can be resumed to at least once a day and titrate up if blood pressure supports it.  Please recheck hemoglobin in next 1-2 days in skilled nursing facility to make sure it is stable.  Discharge Diagnoses:  Principal Problem:   Catheter-associated urinary tract infection ([place]) Active Problems:   SIRS (systemic inflammatory response syndrome) (HCC)   Palliative care encounter   Bacteremia due to coagulase-negative Staphylococcus   Left nephrolithiasis   Gram-negative bacteremia (HCC)   Partial small bowel obstruction (HCC)   Acute renal failure (HCC)    Discharge Condition: stable   Diet recommendation: as tolerated   History of present illness:  55 year old female, chronically ill, with history of MS, decubitus ulcers, chronic kidney disease, DVT, CVA and malnutrition. Patient lives at home, has [facility] services in place.  Patient presented to 12/18/2015 with worsening creatinine from 0.54-3.32. She was subsequently found to have urinary tract infection. Her urine culture grew Providencia stuartii species. Blood culture, one set grew Providencia species and another set was positive for coagulase-negative staph species. Patient underwent left percutaneous nephrostomy tube 12/25/2015. Patient was also seen by wound care team for stage IV decubitus ulcers on sacral area. Per infectious disease, patient is supposed to finish  vancomycin and Rocephin through July 21.   Hospital Course:    Assessment & Plan:  Active Problems: Sepsis secondary to UTI from indwelling foley cath and left pyelonephritis, Staph and providencia stuarti bacteremia in patient with neurogenic bladder / Leukocytosis - Urine culture from 7/7 with providencia stuarti and blood cultures from 7/7 with coag negative staph and providencia stuarti  - Repeat blood cultures 12/27/2015 show no growth for 5 days - Per infectious disease, continue vancomycin and Rocephin through 01/01/2016  - Patient is status post left percutaneous nephrostomy tube placement 12/25/2015.  Anemia of chronic disease - Hemoglobin down to 6.6 today - Give 2 U PRBC transfusion prior to discharge.  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  - No indication for surgical intervention at this time .  Left-sided obstructive nephrolithiasis - Partially obstructive left-sided nephrolithiasis, with stones at the ureteropelvic junction and in the distal ureter just above the left ureterovesical junction - IR consulted and pt underwent left percutaneous nephrostomy tube placement 7/14. 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 - Continue sliding scale insulin - CBGs in past 24 hours: 165, 116, 129  MS - D/c to SNF, pt now agreeable to go to SNF   DVT prophylaxis: On xarelto  Code Status: full code  Family Communication: no family at the bedside this am    Consultants:   GU  IR  SLP  ID  Surgery  PCT  Procedures:   Left percutaneous nephrostomy 12/25/2015  Antimicrobials:   Vancomycin 12/18/2015 --> 01/01/2016  Rocephin 12/22/2015 --> 01/01/2016   Zosyn 12/18/2015 --> 12/22/2015   Signed:  Leisa Lenz, MD  [facility] 12/30/2015, 10:42 AM  Pager #: [phone]  Time spent in minutes: more than 30 minutes    Discharge Exam: Filed Vitals:   12/29/15 2220 12/30/15 0629  BP: 115/70 114/62  Pulse: 100 88  Temp: 98.7 F (37.1 C) 98.1 F (36.7 C)  Resp: 16 16   Filed Vitals:   12/29/15 0620 12/29/15 1459 12/29/15 2220 12/30/15 0629  BP: 116/70 125/75 115/70 114/62  Pulse: 94 97 100 88  Temp: 98.4 F (36.9 C) 99.2 F (37.3 C) 98.7 F (37.1 C) 98.1 F (36.7 C)  TempSrc:  Oral Oral Oral  Resp: 16 19 16 16   Height:      Weight:      SpO2: 97% 99% 98% 100%    General: Pt is alert, follows commands appropriately, not in acute distress Cardiovascular: Regular rate and rhythm, S1/S2 + Respiratory: Clear to auscultation bilaterally, no wheezing, no crackles, no rhonchi Abdominal: Soft, non tender, non distended, bowel sounds +, no guarding Extremities: unna boots on Neuro: Grossly nonfocal  Discharge Instructions  Discharge Instructions    Call MD for:  difficulty breathing, headache or visual disturbances    Complete by:  As directed      Call MD for:  persistant nausea and vomiting    Complete by:  As directed      Call MD for:  severe uncontrolled pain    Complete by:  As directed       Diet - low sodium heart healthy    Complete by:  As directed      Discharge instructions    Complete by:  As directed   Continue Rocephin and vancomycin for 3 more days on discharge. Rocephin is 1 day regimen and vancomycin is twice a day regimen. Please consider resuming Lasix if blood pressure is stable. Patient's blood pressure is 114/62. She is on lisinopril. Her Lasix dose is usually 40 mg twice a day. If blood pressure is at least 130/85 and Lasix can be resumed to at least once a day and titrate up if blood pressure supports it.     Increase activity slowly    Complete by:  As directed             Medication List    STOP taking these medications        albuterol 108 (90 Base) MCG/ACT inhaler  Commonly known as:  PROVENTIL HFA;VENTOLIN HFA  Replaced by:  albuterol (2.5 MG/3ML) 0.083% nebulizer solution     APAP 325 MG tablet     furosemide 40 MG tablet  Commonly known as:  LASIX     HYDROcodone-acetaminophen 5-325 MG tablet  Commonly known as:  NORCO     lovastatin 20 MG tablet  Commonly known as:  MEVACOR     metFORMIN 500 MG tablet  Commonly known as:  GLUCOPHAGE     sulfamethoxazole-trimethoprim 800-160 MG tablet  Commonly known as:  BACTRIM DS,SEPTRA DS      TAKE these medications        albuterol (2.5 MG/3ML) 0.083% nebulizer solution  Commonly known as:  PROVENTIL  Take 3 mLs (2.5 mg total) by nebulization every 6 (six) hours as needed for wheezing or shortness of breath.     baclofen 10 MG tablet  Commonly known as:  LIORESAL  Take 1 tablet (10 mg total) by mouth 3 (three) times daily.     cefTRIAXone 2 g in dextrose 5 % 50 mL  Inject 2 g into the vein daily.     famotidine 20 MG tablet  Commonly known as:  PEPCID  Take 1 tablet (20 mg total) by mouth 2 (two) times daily.     feeding supplement (ENSURE ENLIVE) Liqd  Take 237 mLs by mouth 2 (two) times daily between meals.     feeding supplement (PRO-STAT SUGAR FREE 64) Liqd  Take 30 mLs by mouth  2 (two) times daily.     gabapentin 100 MG capsule  Commonly known as:  NEURONTIN  Take 1 capsule (100 mg total) by mouth at bedtime.     glipiZIDE 5 MG tablet  Commonly known as:  GLUCOTROL  Take 5 mg by mouth 2 (two) times daily.     lisinopril 2.5 MG tablet  Commonly known as:  PRINIVIL,ZESTRIL  Take 2.5 mg by mouth daily.     multivitamin with minerals Tabs tablet  Take 1 tablet by mouth daily.     Omega 3 1000 MG Caps  Take 1,000 mg by mouth daily.     polyethylene glycol packet  Commonly known as:  MIRALAX / GLYCOLAX  Take 17 g by mouth 2 (two) times daily.     pravastatin 20 MG tablet  Commonly known as:  PRAVACHOL  Take 1 tablet (20 mg total) by mouth daily at 6 PM.     rivaroxaban 20 MG Tabs tablet  Commonly known as:  XARELTO  Take 1 tablet (20 mg total) by mouth daily with supper. Reported on 08/12/2015     senna-docusate 8.6-50 MG tablet  Commonly known as:  Senokot-S  Take 1 tablet by mouth 2 (two) times daily.     solifenacin 5 MG tablet  Commonly known as:  VESICARE  Take 1 tablet (5 mg total) by mouth daily.     Vancomycin 750-5 MG/150ML-% Soln  Commonly known as:  VANCOCIN  Inject 150 mLs (750 mg total) into the vein every 12 (twelve) hours.     vitamin C 500 MG tablet  Commonly known as:  ASCORBIC ACID  Take 500 mg by mouth daily.           Follow-up Information    Follow up with [facility].   Why:  For home health services   Contact information:   [address] [place] [address] [phone]       Schedule an appointment as soon as possible for a visit with [name], MD.   Specialty:  Internal Medicine   Contact information:   [address] [place] [address] [phone]  Follow up with [facility].   Why:  to apply for Medicaid   Contact information:   [address], [place], [place] [address] [phone]       The results of  significant diagnostics from this hospitalization (including imaging, microbiology, ancillary and laboratory) are listed below for reference.    Significant Diagnostic Studies: Ct Head Wo Contrast  12/18/2015  CLINICAL DATA:  Altered mental status. EXAM: CT HEAD WITHOUT CONTRAST TECHNIQUE: Contiguous axial images were obtained from the base of the skull through the vertex without intravenous contrast. COMPARISON:  CT 08/31/2015.  MRI 07/21/2015. FINDINGS: Generalized brain atrophy. Chronic small-vessel ischemic changes of the deep white matter. No sign of acute infarction, mass lesion, hemorrhage, hydrocephalus or extra-axial collection. No calvarial abnormality. Sinuses, middle ears and mastoids are clear. IMPRESSION: No acute finding. Atrophy and chronic small-vessel disease of the white matter. Electronically Signed   By: Nelson Chimes M.D.   On: 12/18/2015 11:50   Ct Abdomen Pelvis W Contrast  12/22/2015  CLINICAL DATA:  55 year old female admitted 12/18/2015 with urinary tract infection and altered mental status. Acute renal failure. Prior ultrasound exam 12/18/2015 demonstrates evidence of pyelonephritis, left nephrolithiasis, and hydronephrosis. EXAM: CT ABDOMEN AND PELVIS WITH CONTRAST TECHNIQUE: Multidetector CT imaging of the abdomen and pelvis was performed using the standard protocol following bolus administration of intravenous contrast. CONTRAST:  139mL ISOVUE-300 IOPAMIDOL (ISOVUE-300) INJECTION 61% COMPARISON:  Ultrasound 12/18/2015, CT 12/15/2014 FINDINGS: Lower chest: Unremarkable appearance of the soft tissues of the chest wall. Heart size within normal limits.  No pericardial fluid/thickening. No lower mediastinal adenopathy. Unremarkable appearance of the distal esophagus. Small hiatal hernia Dependent atelectasis with trace bilateral pleural effusions. Abdomen/pelvis: Unremarkable appearance of liver and spleen. Unremarkable bilateral adrenal glands. Unremarkable pancreas. Unremarkable  gallbladder. Distended stomach, duodenum, and jejunum, with multiple air-fluid levels and dilated bowel loops. There is decompression of distal ileum in the right lower quadrant, with a transition on image 53. Mild to moderate stool burden.  Colonic diverticula. Within the low abdomen/pelvis there are ill-defined inflammatory changes with contracted appearance of the local anatomy including sigmoid colon, dome of the urinary bladder, adjacent small bowel loops, mesenteric, and the abdominal wall musculature (image 83). Small amount of free-fluid, with obscuration of the fat planes. Trace fluid throughout the abdomen.  No free air identified. Configuration the right kidney collecting system unchanged from the comparison CT, with extrarenal pelvis and mild dilation the proximal ureter. Unremarkable course of the right ureter with no stone identified. Similar appearance of left-sided nephrolithiasis with unchanged appearance of the dilated collecting system. The stones at the left ureteropelvic junction have increased in size. Unchanged position of distal left ureteral stone, just above the ureterovesical junction. This stone is slightly increased in size now measuring 11 mm. Significant inflammatory changes associated with the left collecting system, with thickening and enhancement of the proximal left ureter and ureterovesical junction. Symmetric perfusion of the kidneys maintained. Urinary bladder is decompressed with urinary catheter in situ. Gas within the dome of the urinary bladder, nonspecific. The dome of the bladder is indistinguishable from inflammatory changes in the low abdomen/ pelvis, described above. Multiple retroperitoneal lymph nodes. Fibroid changes of the uterus. Scattered calcifications of the abdominal aorta. No dissection or aneurysm. Atherosclerotic changes at the origin of the mesenteric vessels, with no significant stenosis or occlusion identified. Musculoskeletal: Degenerative changes of the  spine. No significant bony canal narrowing. No displaced fracture. IMPRESSION: Evidence of developing bowel obstruction or ileus, with  transition point in the distal ileum. Etiology may be secondary to adhesion of the right lower quadrant, or could potentially be reactive given the significant inflammatory changes involving the urinary system and the urinary bladder. Ill-defined inflammatory/ infectious changes of the low abdomen/ pelvis, where several viscera are involved. Inflammatory changes involve the dome of the urinary bladder, distal small bowel loops, and the sigmoid colon, with a source involving any of these. Favored etiology is secondary to chronic urinary tract infection given the imaging appearance over time and left-sided obstructive nephrolithiasis, however, diverticulitis not excluded. Persistent at least partially obstructive left-sided nephrolithiasis, with stones at the ureteropelvic junction and in the distal ureter just above the left ureterovesical junction. Inflammatory changes at the hilum of the left kidney have worsened since the comparison CT, compatible with pyelonephritis/urinary tract infection. Associated lymph nodes of the retroperitoneum. Aortic atherosclerosis. Trace bilateral pleural effusions. Signed, Dulcy Fanny. Earleen Newport, DO [facility] Electronically Signed   By: Corrie Mckusick D.O.   On: 12/22/2015 07:45   US Renal  12/18/2015  CLINICAL DATA:  Altered mental status.  UTI. EXAM: RENAL / URINARY TRACT ULTRASOUND COMPLETE COMPARISON:  07/21/2015 FINDINGS: Right Kidney: Length: 14 cm. Mild right hydronephrosis, similar to 07/21/2015 sonogram and abdominal CT 12/15/2014. Echogenic cortex Left Kidney: Length: 15.7 cm. Confluent calculi in the renal pelvis with the largest discrete stone measuring 22 mm. There is chronic hydronephrosis. Urinary collecting system debris. A non shadowing rounded echogenic structure in the  collecting system superiorly is likely also debris. Lower pole cyst, both hilar and cortical with bilobed appearance, up to 73 mm in diameter and stable/simple. Bladder: History of Foley catheter.  The bladder is collapsed. IMPRESSION: 1. Collecting system debris on the left as seen with infection or hemorrhage. Asymmetric enlargement of the left kidney, a sign seen with pyelonephritis. No indication of abscess. 2. Chronic mild bilateral hydronephrosis. 3. Extensive left nephrolithiasis. 4. Chronically enlarged and echogenic kidneys, favored secondary to patient's diabetes. Electronically Signed   By: Monte Fantasia M.D.   On: 12/18/2015 17:14   Ir US Guide Vasc Access Left  12/25/2015  INDICATION: Sepsis, urinary tract infection and obstructive uropathy on the left with central pelvic calculi and additional distal left ureteral calculus. Left-sided percutaneous nephrostomy has been requested for decompression. IV access was lost and there has been inability to re-establish IV access for conscious sedation. EXAM: 1. ULTRASOUND-GUIDED VASCULAR ACCESS OF THE LEFT BRACHIAL VEIN WITH IV ACCESS PLACEMENT 2. LEFT PERCUTANEOUS NEPHROSTOMY TUBE PLACEMENT WITH ULTRASOUND GUIDANCE IR NEPHROSTOMY PLACEMENT LEFT; IR RADIOLOGY PERIPHERAL GUIDED IV START; IR ULTRASOUND GUIDANCE VASC ACCESS LEFT COMPARISON:  CT of the abdomen and pelvis on 12/22/2015 MEDICATIONS: 2 g IV Ancef. As antibiotic prophylaxis, Ancef was ordered pre-procedure and administered intravenously within one hour of incision. ANESTHESIA/SEDATION: Fentanyl 75 mcg IV; Versed 1.5 mg IV Moderate Sedation Time:  20 minutes. The patient was continuously monitored during the procedure by the interventional radiology nurse under my direct supervision. CONTRAST:  20 mL Isovue-300 - administered into the collecting system(s) FLUOROSCOPY TIME:  Fluoroscopy Time: 2 minutes and 12 seconds. COMPLICATIONS: None immediate. PROCEDURE: Informed written consent was obtained  from the patient after a thorough discussion of the procedural risks, benefits and alternatives. All questions were addressed. Maximal Sterile Barrier Technique was utilized including caps, mask, sterile gowns, sterile gloves, sterile drape, hand hygiene and skin antiseptic. A timeout was performed prior to the initiation of the procedure. Ultrasound was used to confirm patency of  the left brachial vein. Under direct ultrasound guidance, a 21 gauge needle was advanced into the vein. After securing guidewire access, a 4 French micropuncture dilator was advanced into the vein. This is secured for use for IV access. The patient was placed in a prone position and sonographic localization of the left kidney performed. Skin was prepped with chlorhexidine. Local anesthesia was provided with 1% lidocaine. Under ultrasound guidance, a 21 gauge needle was advanced into the left kidney. Contrast injection was performed under fluoroscopy. Additional access of the left kidney was then performed under ultrasound guidance with a second 21 gauge needle into the upper pole collecting system. Contrast injection was performed under fluoroscopy. A guidewire was advanced into the collecting system. A transitional dilator was placed. A guidewire was then advanced into the collecting system. Percutaneous access was dilated and a 10 French percutaneous nephrostomy tube advanced. The catheter was formed and injected with contrast material to confirm position. The catheter was then flushed and connected to a gravity drainage bag. The tube was secured at the skin with a Prolene retention suture and StatLock device. FINDINGS: Initial puncture of a dumbbell-shaped left renal cyst was performed. Contrast injection confirms that this is a large cyst with no communication with the collecting system. Two adjacent large calculi are identified in the renal pelvis. After separate upper pole access, opacification of the collecting system was  accomplished. This demonstrates 2 calculi in the renal pelvis occupying most of the space of the renal pelvis. There is flow of contrast past the calculi and into the ureter. Degree of hydronephrosis is relatively mild. The renal cysts cause splaying of the interpolar and lower pole collecting system. Via upper pole access, a nephrostomy tube was placed and advanced into the renal pelvis. IMPRESSION: 1. Ultrasound-guided venous access of the left brachial vein performed. 2. Left-sided percutaneous nephrostomy tube placement, as above. Initial access of a large dumbbell-shaped renal cyst was performed with contrast injection showing no communication with the collecting system. After upper pole access, a 10 French nephrostomy tube was placed and advanced to the renal pelvis. This was connected to a gravity drainage bag. Electronically Signed   By: Aletta Edouard M.D.   On: 12/25/2015 16:54   Dg Chest Port 1 View  12/18/2015  CLINICAL DATA:  Weakness, altered mental status EXAM: PORTABLE CHEST 1 VIEW COMPARISON:  Chest x-rays dated 11/11/2015 and 12/14/2014. FINDINGS: Heart size is normal. Overall cardiomediastinal silhouette is normal in size and configuration. Study is hypoinspiratory with crowding of the perihilar bronchovascular markings. Given the low lung volumes, lungs appear clear. Questionable mild atelectasis or small pleural effusion at the left costophrenic angle. IMPRESSION: Questionable mild atelectasis or small pleural effusion at the left costophrenic angle. Lungs otherwise clear. No evidence of pneumonia. No large pleural effusion. Heart size normal. Electronically Signed   By: Franki Cabot M.D.   On: 12/18/2015 11:17   Dg Abd 2 Views  12/20/2015  CLINICAL DATA:  Abdominal distention EXAM: ABDOMEN - 2 VIEW COMPARISON:  11/11/2015 abdominal radiograph FINDINGS: Diffuse gaseous distention of the small and large bowel. No evidence of pneumatosis or pneumoperitoneum. Clustered 28 x 11 mm and 22 x 11  mm left renal stones appear stable. Clustered 19 x 6 mm and 4 mm left deep pelvic calcifications, cannot exclude distal left ureteral stones. IMPRESSION: 1. Diffuse gaseous distention of the small and large bowel, most suggestive of diffuse adynamic ileus. Distal small bowel obstruction cannot be excluded. If there is clinical concern for small  bowel obstruction, consider correlation with CT abdomen/pelvis with oral and IV contrast. 2. Left renal stones.  Possible distal left ureteral stones. Electronically Signed   By: Ilona Sorrel M.D.   On: 12/20/2015 11:48   Ir Radiology Peripheral Guided Iv Start  12/25/2015  INDICATION: Sepsis, urinary tract infection and obstructive uropathy on the left with central pelvic calculi and additional distal left ureteral calculus. Left-sided percutaneous nephrostomy has been requested for decompression. IV access was lost and there has been inability to re-establish IV access for conscious sedation. EXAM: 1. ULTRASOUND-GUIDED VASCULAR ACCESS OF THE LEFT BRACHIAL VEIN WITH IV ACCESS PLACEMENT 2. LEFT PERCUTANEOUS NEPHROSTOMY TUBE PLACEMENT WITH ULTRASOUND GUIDANCE IR NEPHROSTOMY PLACEMENT LEFT; IR RADIOLOGY PERIPHERAL GUIDED IV START; IR ULTRASOUND GUIDANCE VASC ACCESS LEFT COMPARISON:  CT of the abdomen and pelvis on 12/22/2015 MEDICATIONS: 2 g IV Ancef. As antibiotic prophylaxis, Ancef was ordered pre-procedure and administered intravenously within one hour of incision. ANESTHESIA/SEDATION: Fentanyl 75 mcg IV; Versed 1.5 mg IV Moderate Sedation Time:  20 minutes. The patient was continuously monitored during the procedure by the interventional radiology nurse under my direct supervision. CONTRAST:  20 mL Isovue-300 - administered into the collecting system(s) FLUOROSCOPY TIME:  Fluoroscopy Time: 2 minutes and 12 seconds. COMPLICATIONS: None immediate. PROCEDURE: Informed written consent was obtained from the patient after a thorough discussion of the procedural risks, benefits  and alternatives. All questions were addressed. Maximal Sterile Barrier Technique was utilized including caps, mask, sterile gowns, sterile gloves, sterile drape, hand hygiene and skin antiseptic. A timeout was performed prior to the initiation of the procedure. Ultrasound was used to confirm patency of the left brachial vein. Under direct ultrasound guidance, a 21 gauge needle was advanced into the vein. After securing guidewire access, a 4 French micropuncture dilator was advanced into the vein. This is secured for use for IV access. The patient was placed in a prone position and sonographic localization of the left kidney performed. Skin was prepped with chlorhexidine. Local anesthesia was provided with 1% lidocaine. Under ultrasound guidance, a 21 gauge needle was advanced into the left kidney. Contrast injection was performed under fluoroscopy. Additional access of the left kidney was then performed under ultrasound guidance with a second 21 gauge needle into the upper pole collecting system. Contrast injection was performed under fluoroscopy. A guidewire was advanced into the collecting system. A transitional dilator was placed. A guidewire was then advanced into the collecting system. Percutaneous access was dilated and a 10 French percutaneous nephrostomy tube advanced. The catheter was formed and injected with contrast material to confirm position. The catheter was then flushed and connected to a gravity drainage bag. The tube was secured at the skin with a Prolene retention suture and StatLock device. FINDINGS: Initial puncture of a dumbbell-shaped left renal cyst was performed. Contrast injection confirms that this is a large cyst with no communication with the collecting system. Two adjacent large calculi are identified in the renal pelvis. After separate upper pole access, opacification of the collecting system was accomplished. This demonstrates 2 calculi in the renal pelvis occupying most of the space  of the renal pelvis. There is flow of contrast past the calculi and into the ureter. Degree of hydronephrosis is relatively mild. The renal cysts cause splaying of the interpolar and lower pole collecting system. Via upper pole access, a nephrostomy tube was placed and advanced into the renal pelvis. IMPRESSION: 1. Ultrasound-guided venous access of the left brachial vein performed. 2. Left-sided percutaneous nephrostomy tube placement, as  above. Initial access of a large dumbbell-shaped renal cyst was performed with contrast injection showing no communication with the collecting system. After upper pole access, a 10 French nephrostomy tube was placed and advanced to the renal pelvis. This was connected to a gravity drainage bag. Electronically Signed   By: Aletta Edouard M.D.   On: 12/25/2015 16:54   Ir Nephrostomy Placement Left  12/25/2015  INDICATION: Sepsis, urinary tract infection and obstructive uropathy on the left with central pelvic calculi and additional distal left ureteral calculus. Left-sided percutaneous nephrostomy has been requested for decompression. IV access was lost and there has been inability to re-establish IV access for conscious sedation. EXAM: 1. ULTRASOUND-GUIDED VASCULAR ACCESS OF THE LEFT BRACHIAL VEIN WITH IV ACCESS PLACEMENT 2. LEFT PERCUTANEOUS NEPHROSTOMY TUBE PLACEMENT WITH ULTRASOUND GUIDANCE IR NEPHROSTOMY PLACEMENT LEFT; IR RADIOLOGY PERIPHERAL GUIDED IV START; IR ULTRASOUND GUIDANCE VASC ACCESS LEFT COMPARISON:  CT of the abdomen and pelvis on 12/22/2015 MEDICATIONS: 2 g IV Ancef. As antibiotic prophylaxis, Ancef was ordered pre-procedure and administered intravenously within one hour of incision. ANESTHESIA/SEDATION: Fentanyl 75 mcg IV; Versed 1.5 mg IV Moderate Sedation Time:  20 minutes. The patient was continuously monitored during the procedure by the interventional radiology nurse under my direct supervision. CONTRAST:  20 mL Isovue-300 - administered into the  collecting system(s) FLUOROSCOPY TIME:  Fluoroscopy Time: 2 minutes and 12 seconds. COMPLICATIONS: None immediate. PROCEDURE: Informed written consent was obtained from the patient after a thorough discussion of the procedural risks, benefits and alternatives. All questions were addressed. Maximal Sterile Barrier Technique was utilized including caps, mask, sterile gowns, sterile gloves, sterile drape, hand hygiene and skin antiseptic. A timeout was performed prior to the initiation of the procedure. Ultrasound was used to confirm patency of the left brachial vein. Under direct ultrasound guidance, a 21 gauge needle was advanced into the vein. After securing guidewire access, a 4 French micropuncture dilator was advanced into the vein. This is secured for use for IV access. The patient was placed in a prone position and sonographic localization of the left kidney performed. Skin was prepped with chlorhexidine. Local anesthesia was provided with 1% lidocaine. Under ultrasound guidance, a 21 gauge needle was advanced into the left kidney. Contrast injection was performed under fluoroscopy. Additional access of the left kidney was then performed under ultrasound guidance with a second 21 gauge needle into the upper pole collecting system. Contrast injection was performed under fluoroscopy. A guidewire was advanced into the collecting system. A transitional dilator was placed. A guidewire was then advanced into the collecting system. Percutaneous access was dilated and a 10 French percutaneous nephrostomy tube advanced. The catheter was formed and injected with contrast material to confirm position. The catheter was then flushed and connected to a gravity drainage bag. The tube was secured at the skin with a Prolene retention suture and StatLock device. FINDINGS: Initial puncture of a dumbbell-shaped left renal cyst was performed. Contrast injection confirms that this is a large cyst with no communication with the  collecting system. Two adjacent large calculi are identified in the renal pelvis. After separate upper pole access, opacification of the collecting system was accomplished. This demonstrates 2 calculi in the renal pelvis occupying most of the space of the renal pelvis. There is flow of contrast past the calculi and into the ureter. Degree of hydronephrosis is relatively mild. The renal cysts cause splaying of the interpolar and lower pole collecting system. Via upper pole access, a nephrostomy tube was placed and advanced  into the renal pelvis. IMPRESSION: 1. Ultrasound-guided venous access of the left brachial vein performed. 2. Left-sided percutaneous nephrostomy tube placement, as above. Initial access of a large dumbbell-shaped renal cyst was performed with contrast injection showing no communication with the collecting system. After upper pole access, a 10 French nephrostomy tube was placed and advanced to the renal pelvis. This was connected to a gravity drainage bag. Electronically Signed   By: Aletta Edouard M.D.   On: 12/25/2015 16:54    Microbiology: Recent Results (from the past 240 hour(s))  Culture, blood (Routine X 2) w Reflex to ID Panel     Status: None   Collection Time: 12/22/15  4:00 PM  Result Value Ref Range Status   Specimen Description BLOOD LEFT ANTECUBITAL  Final   Special Requests BOTTLES DRAWN AEROBIC AND ANAEROBIC 10CC  Final   Culture NO GROWTH 5 DAYS  Final   Report Status 12/27/2015 FINAL  Final  Culture, blood (Routine X 2) w Reflex to ID Panel     Status: None   Collection Time: 12/22/15  4:15 PM  Result Value Ref Range Status   Specimen Description BLOOD LEFT FOREARM  Final   Special Requests BOTTLES DRAWN AEROBIC AND ANAEROBIC 10CC  Final   Culture NO GROWTH 5 DAYS  Final   Report Status 12/27/2015 FINAL  Final     Labs: Basic Metabolic Panel:  Recent Labs Lab 12/25/15 0617 12/29/15 1355  NA 137 137  K 4.2 3.9  CL 107 104  CO2 26 28  GLUCOSE 130*  195*  BUN <5* 12  CREATININE 0.52 0.61  CALCIUM 9.6 10.2   Liver Function Tests: No results for input(s): AST, ALT, ALKPHOS, BILITOT, PROT, ALBUMIN in the last 168 hours. No results for input(s): LIPASE, AMYLASE in the last 168 hours. No results for input(s): AMMONIA in the last 168 hours. CBC:  Recent Labs Lab 12/25/15 0617 12/27/15 0525 12/28/15 0414 12/29/15 1355 12/30/15 0510  WBC 21.5* 18.2* 17.6* 16.2* 16.7*  NEUTROABS  --  12.1* 12.1* 11.4*  --   HGB 8.3* 7.1* 7.1* 7.0* 6.6*  HCT 25.3* 23.0* 22.9* 23.0* 21.3*  MCV 83.5 84.6 85.8 85.5 85.2  PLT 522* 619* 724* 809* 818*   Cardiac Enzymes: No results for input(s): CKTOTAL, CKMB, CKMBINDEX, TROPONINI in the last 168 hours. BNP: BNP (last 3 results)  Recent Labs  12/18/15 1035  BNP 84.8    ProBNP (last 3 results) No results for input(s): PROBNP in the last 8760 hours.  CBG:  Recent Labs Lab 12/29/15 0805 12/29/15 1139 12/29/15 1720 12/29/15 2222 12/30/15 0804  GLUCAP 129* 191* 174* 171* 129*

## 2015-12-30 NOTE — Progress Notes (Signed)
PMT note: Team has been following peripherally. Discussed in team rounds. Will sign off for now; please re-consult if we can be of further help.  Marjie Skiff Conita Amenta, RN, BSN, [facility] 12/30/2015 8:38 AM Cell [phone] 8:00-4:00 Monday-Friday Office [phone]

## 2015-12-30 NOTE — Progress Notes (Signed)
Called [name] to give report and York Cerise, RN @ [phone] (supervisors cell phone) refused patient due to no follow up CBC. Patient had received 2 U PRBC for Hgb 6.6. Awaiting word back from the Dr. For F/U lab

## 2015-12-30 NOTE — Progress Notes (Signed)
Pt.discharged to [facility] via [facility]  In fair condition.Picc line in placed & is capped.

## 2015-12-30 NOTE — Care Management Note (Signed)
Case Management Note  Patient Details  Name: AALYA BADY MRN: [id number] Date of Birth: [birth year]  Subjective/Objective:            Per [facility] infusion therapy coordinator, patient does not have adequate home support for IV therapies, and patient now agreeable to SNF. CM updated [place] CSW of change in DC plan this am during progression. Dr Charlies Silvers updated as well. Patient to DC to SNF later today after 2 units blood transfused.  Spoke with [facility], she states that [facility] can follow patient after SNF DC, and she will pass HRI plan on to community coordinator.    Action/Plan:  Pt will DC to SNF (not yet assigned) after blood transfusion later today.  Expected Discharge Date:                  Expected Discharge Plan:  [place]  In-House Referral:     Discharge planning Services  CM Consult  Post Acute Care Choice:    Choice offered to:     DME Arranged:    DME Agency:     HH Arranged:    [place] Agency:     Status of Service:  Completed, signed off  If discussed at [employer] of Stay Meetings, dates discussed:    Additional Comments:  Carles Collet, RN 12/30/2015, 11:23 AM

## 2015-12-30 NOTE — Progress Notes (Addendum)
Nsg Discharge Note  Admit Date:  12/18/2015 Discharge date: 12/30/2015   Veleta Miners to be D/C'd Skilled nursing facility per MD order.  AVS completed.  Copy for chart, and copy for patient signed, and dated. Patient/caregiver able to verbalize understanding.  Discharge Medication:   Medication List    STOP taking these medications        albuterol 108 (90 Base) MCG/ACT inhaler  Commonly known as:  PROVENTIL HFA;VENTOLIN HFA  Replaced by:  albuterol (2.5 MG/3ML) 0.083% nebulizer solution     APAP 325 MG tablet     furosemide 40 MG tablet  Commonly known as:  LASIX     HYDROcodone-acetaminophen 5-325 MG tablet  Commonly known as:  NORCO     lovastatin 20 MG tablet  Commonly known as:  MEVACOR     metFORMIN 500 MG tablet  Commonly known as:  GLUCOPHAGE     sulfamethoxazole-trimethoprim 800-160 MG tablet  Commonly known as:  BACTRIM DS,SEPTRA DS      TAKE these medications        albuterol (2.5 MG/3ML) 0.083% nebulizer solution  Commonly known as:  PROVENTIL  Take 3 mLs (2.5 mg total) by nebulization every 6 (six) hours as needed for wheezing or shortness of breath.     baclofen 10 MG tablet  Commonly known as:  LIORESAL  Take 1 tablet (10 mg total) by mouth 3 (three) times daily.     cefTRIAXone 2 g in dextrose 5 % 50 mL  Inject 2 g into the vein daily.     famotidine 20 MG tablet  Commonly known as:  PEPCID  Take 1 tablet (20 mg total) by mouth 2 (two) times daily.     feeding supplement (ENSURE ENLIVE) Liqd  Take 237 mLs by mouth 2 (two) times daily between meals.     feeding supplement (PRO-STAT SUGAR FREE 64) Liqd  Take 30 mLs by mouth 2 (two) times daily.     gabapentin 100 MG capsule  Commonly known as:  NEURONTIN  Take 1 capsule (100 mg total) by mouth at bedtime.     glipiZIDE 5 MG tablet  Commonly known as:  GLUCOTROL  Take 5 mg by mouth 2 (two) times daily.     lisinopril 2.5 MG tablet  Commonly known as:  PRINIVIL,ZESTRIL  Take 2.5 mg by  mouth daily.     multivitamin with minerals Tabs tablet  Take 1 tablet by mouth daily.     Omega 3 1000 MG Caps  Take 1,000 mg by mouth daily.     polyethylene glycol packet  Commonly known as:  MIRALAX / GLYCOLAX  Take 17 g by mouth 2 (two) times daily.     pravastatin 20 MG tablet  Commonly known as:  PRAVACHOL  Take 1 tablet (20 mg total) by mouth daily at 6 PM.     rivaroxaban 20 MG Tabs tablet  Commonly known as:  XARELTO  Take 1 tablet (20 mg total) by mouth daily with supper. Reported on 08/12/2015     senna-docusate 8.6-50 MG tablet  Commonly known as:  Senokot-S  Take 1 tablet by mouth 2 (two) times daily.     solifenacin 5 MG tablet  Commonly known as:  VESICARE  Take 1 tablet (5 mg total) by mouth daily.     Vancomycin 750-5 MG/150ML-% Soln  Commonly known as:  VANCOCIN  Inject 150 mLs (750 mg total) into the vein every 12 (twelve) hours.     vitamin  C 500 MG tablet  Commonly known as:  ASCORBIC ACID  Take 500 mg by mouth daily.        Discharge Assessment: Filed Vitals:   12/30/15 1503 12/30/15 1511  BP: 118/73 124/64  Pulse: 94 94  Temp: 98.4 F (36.9 C) 98.4 F (36.9 C)  Resp: 18 19   Skin clean, dry and intact without evidence of skin break down, no evidence of skin tears noted. IV catheter discontinued intact. Site without signs and symptoms of complications - no redness or edema noted at insertion site, patient denies c/o pain - only slight tenderness at site.  Dressing with slight pressure applied.  D/c Instructions-Education: Discharge instructions given to transporters/faciltiy.  Patient instructed to return to ED, call 911, or call MD for any changes in condition.  Patient transported via [facility] to SNF.    Zyionna Pesce Margaretha Sheffield, RN 12/30/2015 5:51 PM

## 2015-12-30 NOTE — NC FL2 (Signed)
[place] LEVEL OF CARE SCREENING TOOL     IDENTIFICATION  Patient Name: [name] Birthdate: 12-15-1960 Sex: female Admission Date (Current Location): 12/18/2015  [facility] and [place] Number:  Herbalist and Address:  [facility] [place], [place] [address], [place], [place] [address]      Provider Number: [id number]  Attending Physician Name and Address:  Robbie Lis, MD  Relative Name and Phone Number:  [name] [phone]    Current Level of Care: Hospital Recommended Level of Care: [place] Prior Approval Number:    Date Approved/Denied:   PASRR Number:    Discharge Plan: SNF    Current Diagnoses: Patient Active Problem List   Diagnosis Date Noted  . Acute renal failure ([place])   . Partial small bowel obstruction ([place]) 12/24/2015  . Bacteremia due to coagulase-negative Staphylococcus 12/22/2015  . Left nephrolithiasis 12/22/2015  . Gram-negative bacteremia ([place]) 12/22/2015  . Palliative care encounter   . SIRS (systemic inflammatory response syndrome) ([place]) 12/18/2015  . Decubitus ulcer   . Controlled diabetes mellitus type 2 with complications ([place])   . Neurogenic bladder 11/11/2015  . Physical deconditioning 11/11/2015  . Chronic indwelling Foley catheter 09/30/2015  . Hypokalemia 09/04/2015  . Dyslipidemia associated with type 2 diabetes mellitus ([place]) 08/28/2015  . GERD without esophagitis 08/28/2015  . Acute renal failure superimposed on stage 3 chronic kidney disease ([place]) 07/21/2015  . Leukocytosis 07/21/2015  . Anemia of chronic disease 07/21/2015  . Hyperkalemia 07/21/2015  . Catheter-associated urinary tract infection ([place]) 07/21/2015  . Malnutrition of moderate degree 07/21/2015  . DVT, lower extremity ([place]) 12/28/2014  . Multiple sclerosis diagnosed 2002-not on Therapy any longer 06/26/2012    Orientation RESPIRATION BLADDER Height & Weight     Self, Situation, Place  Normal Continent,  Indwelling catheter (Urinary catheter) Weight: 63.6 kg (140 lb 3.4 oz) Height:  5\' 3"  (160 cm)  BEHAVIORAL SYMPTOMS/MOOD NEUROLOGICAL BOWEL NUTRITION STATUS      Incontinent Diet (Please see DC summary)  AMBULATORY STATUS COMMUNICATION OF NEEDS Skin   Extensive Assist Verbally                         Personal Care Assistance Level of Assistance  Bathing, Dressing, Feeding Bathing Assistance: Maximum assistance Feeding assistance: Limited assistance Dressing Assistance: Limited assistance     Functional Limitations Info             SPECIAL CARE FACTORS FREQUENCY                       Contractures      Additional Factors Info  Code Status, Allergies Code Status Info: Full Allergies Info: NKA           Current Medications (12/30/2015):  This is the current hospital active medication list Current Facility-Administered Medications  Medication Dose Route Frequency Provider Last Rate Last Dose  . 0.9 %  sodium chloride infusion   Intravenous Once Jeryl [place], NP      . 0.9 %  sodium chloride infusion   Intravenous Once Robbie Lis, MD      . albuterol (PROVENTIL) (2.5 MG/3ML) 0.083% nebulizer solution 2.5 mg  2.5 mg Nebulization Q6H PRN Bonnell Public, MD      . baclofen (LIORESAL) tablet 10 mg  10 mg Oral TID Bonnell Public, MD   10 mg at 12/30/15 0845  . cefTRIAXone (ROCEPHIN)  2 g in dextrose 5 % 50 mL IVPB  2 g Intravenous Q24H Collier Salina, MD   2 g at 12/29/15 1650  . darifenacin (ENABLEX) 24 hr tablet 7.5 mg  7.5 mg Oral Daily Bonnell Public, MD   7.5 mg at 12/30/15 0831  . famotidine (PEPCID) tablet 20 mg  20 mg Oral Daily Donalynn Furlong [place], RPH   20 mg at 12/30/15 0831  . feeding supplement (ENSURE ENLIVE) (ENSURE ENLIVE) liquid 237 mL  237 mL Oral BID BM Theodis Blaze, MD   237 mL at 12/30/15 0830  . gabapentin (NEURONTIN) capsule 100 mg  100 mg Oral QHS Bonnell Public, MD   100 mg at 12/29/15 2307  .  HYDROcodone-acetaminophen (NORCO/VICODIN) 5-325 MG per tablet 1 tablet  1 tablet Oral Q6H PRN Bonnell Public, MD   1 tablet at 12/29/15 2310  . morphine 2 MG/ML injection 1 mg  1 mg Intravenous Q3H PRN Gardiner Barefoot, NP   1 mg at 12/25/15 1702  . multivitamin with minerals tablet 1 tablet  1 tablet Oral Daily Bonnell Public, MD   1 tablet at 12/30/15 [phone]  . ondansetron (ZOFRAN) injection 4 mg  4 mg Intravenous Q6H PRN Lily Kocher, MD   4 mg at 12/20/15 0656  . polyethylene glycol (MIRALAX / GLYCOLAX) packet 17 g  17 g Oral Daily Theodis Blaze, MD   17 g at 12/30/15 0831  . pravastatin (PRAVACHOL) tablet 20 mg  20 mg Oral q1800 Bonnell Public, MD   20 mg at 12/29/15 1800  . rivaroxaban (XARELTO) tablet 20 mg  20 mg Oral Q supper Aletta Edouard, MD   20 mg at 12/29/15 1800  . senna-docusate (Senokot-S) tablet 1 tablet  1 tablet Oral BID Theodis Blaze, MD   1 tablet at 12/30/15 [phone]  . sodium chloride flush (NS) 0.9 % injection 10-40 mL  10-40 mL Intracatheter PRN Bonnell Public, MD   10 mL at 12/30/15 0506  . vancomycin (VANCOCIN) IVPB 750 mg/150 ml premix  750 mg Intravenous Q12H Honor Loh, RPH   750 mg at 12/30/15 0316  . vitamin C (ASCORBIC ACID) tablet 500 mg  500 mg Oral Daily Bonnell Public, MD   500 mg at 12/30/15 [id number]     Discharge Medications: Please see discharge summary for a list of discharge medications.  Relevant Imaging Results:  Relevant Lab Results:   Additional Information SSN: [id number]   Needs IV antibiotics   Benard Halsted, LCSWA

## 2015-12-30 NOTE — Progress Notes (Signed)
CRITICAL VALUE ALERT  Critical value received:  Hgb 6.6  Date of notification:  12/30/15  Time of notification: 0609  Critical value read back:Yes.    Nurse who received alert:  S.Caprice Beaver  MD notified (1st page):  K.Schorr  Time of first page:  0610  MD notified (2nd page):  Time of second page:  Responding MD:    Time MD responded:

## 2015-12-30 NOTE — Progress Notes (Signed)
Patient will DC to: [name] Anticipated DC date: 12/30/15 Family notified: [name] Transport by: [name]   Per MD patient ready for DC to [facility]. RN, patient, patient's family, and facility notified of DC. RN given number for report. DC packet on chart. Ambulance transport requested for patient.   CSW signing off.  [name], [place] Social Worker [phone]

## 2015-12-31 LAB — TYPE AND SCREEN
ABO/RH(D): A POS
Antibody Screen: NEGATIVE
Unit division: 0
Unit division: 0

## 2016-01-04 ENCOUNTER — Non-Acute Institutional Stay (SKILLED_NURSING_FACILITY): Payer: Medicare Other | Admitting: Internal Medicine

## 2016-01-04 ENCOUNTER — Encounter: Payer: Self-pay | Admitting: Internal Medicine

## 2016-01-04 DIAGNOSIS — T83511D Infection and inflammatory reaction due to indwelling urethral catheter, subsequent encounter: Secondary | ICD-10-CM | POA: Diagnosis not present

## 2016-01-04 DIAGNOSIS — E118 Type 2 diabetes mellitus with unspecified complications: Secondary | ICD-10-CM

## 2016-01-04 DIAGNOSIS — I10 Essential (primary) hypertension: Secondary | ICD-10-CM

## 2016-01-04 DIAGNOSIS — N319 Neuromuscular dysfunction of bladder, unspecified: Secondary | ICD-10-CM

## 2016-01-04 DIAGNOSIS — I82402 Acute embolism and thrombosis of unspecified deep veins of left lower extremity: Secondary | ICD-10-CM

## 2016-01-04 DIAGNOSIS — E785 Hyperlipidemia, unspecified: Secondary | ICD-10-CM

## 2016-01-04 DIAGNOSIS — N390 Urinary tract infection, site not specified: Secondary | ICD-10-CM

## 2016-01-04 DIAGNOSIS — R5381 Other malaise: Secondary | ICD-10-CM | POA: Diagnosis not present

## 2016-01-04 DIAGNOSIS — G35 Multiple sclerosis: Secondary | ICD-10-CM | POA: Diagnosis not present

## 2016-01-04 DIAGNOSIS — E440 Moderate protein-calorie malnutrition: Secondary | ICD-10-CM

## 2016-01-04 DIAGNOSIS — B957 Other staphylococcus as the cause of diseases classified elsewhere: Secondary | ICD-10-CM

## 2016-01-04 DIAGNOSIS — N200 Calculus of kidney: Secondary | ICD-10-CM | POA: Diagnosis not present

## 2016-01-04 DIAGNOSIS — D638 Anemia in other chronic diseases classified elsewhere: Secondary | ICD-10-CM | POA: Diagnosis not present

## 2016-01-04 DIAGNOSIS — E1169 Type 2 diabetes mellitus with other specified complication: Secondary | ICD-10-CM | POA: Diagnosis not present

## 2016-01-04 DIAGNOSIS — N183 Chronic kidney disease, stage 3 unspecified: Secondary | ICD-10-CM

## 2016-01-04 DIAGNOSIS — R7881 Bacteremia: Secondary | ICD-10-CM

## 2016-01-04 DIAGNOSIS — K219 Gastro-esophageal reflux disease without esophagitis: Secondary | ICD-10-CM | POA: Diagnosis not present

## 2016-01-04 DIAGNOSIS — M792 Neuralgia and neuritis, unspecified: Secondary | ICD-10-CM

## 2016-01-04 DIAGNOSIS — K5901 Slow transit constipation: Secondary | ICD-10-CM

## 2016-01-04 NOTE — Progress Notes (Signed)
LOCATION: [name]  PCP: No primary care provider on file.   Code Status: Full Code  Goals of care: Advanced Directive information Advanced Directives 12/27/2015  Does patient have an advance directive? No  Does patient want to make changes to advanced directive? -  Copy of advanced directive(s) in chart? -  Would patient like information on creating an advanced directive? -  Pre-existing out of facility DNR order (yellow form or pink MOST form) -       Extended Emergency Contact Information Primary Emergency Contact: Paulino Rily, [place] [address] [place] Phone: [phone] Mobile Phone: [phone] Relation: Relative Secondary Emergency Contact: Charmayne Sheer, [place] [address] [place] Phone: [phone] Relation: Relative   No Known Allergies  Chief Complaint  Patient presents with  . New Admit To SNF    New Admission     HPI:  Patient is a 55 y.o. female seen today for short term rehabilitation post hospital admission from 12/18/15-12/30/15 with catheter associated UTI , left pyelonephritis and staphylococcus bacteremia. She has medical history of multiple sclerosis, decubitus ulcer, ckd, CVA among others. She had undergone left percutaneous nephrostomy tube placement on 12/25/15 for left sided obstructive nephrolithiasis. She was placed on iv vancomycin and rocephin through 01/01/16. She had acute blood loss anemia and required 2 u prbc transfusion. She had acute encephalopathy in setting of infection and impaired renal function that improved with iv fluids and antibiotics. She is seen in her room today.  Review of Systems:  Constitutional: Negative for fever, chills, diaphoresis. Energy level is slowly returning. HENT: Negative for headache, congestion, nasal discharge, sore throat, difficulty swallowing.   Eyes: Negative for blurred vision, double vision and discharge.  Respiratory:  Negative for cough, shortness of breath and wheezing.   Cardiovascular: Negative for chest pain, palpitations, leg swelling.  Gastrointestinal: Negative for heartburn, nausea, vomiting, abdominal pain. Last bowel movement was yesterday.  Genitourinary: Negative for dysuria and flank pain.  Musculoskeletal: Negative for back pain, fall in the facility.  Skin: Negative for itching, rash.  Neurological: Negative for dizziness. Psychiatric/Behavioral: Negative for depression   Past Medical History:  Diagnosis Date  . Acute renal failure superimposed on stage 3 chronic kidney disease ([place]) 07/21/2015  . Diabetes mellitus without complication ([place])   . DVT (deep venous thrombosis) ([place])   . Malnutrition of moderate degree 07/21/2015  . Multiple sclerosis diagnosed 2002-not on Therapy any longer 06/26/2012  . Stage 4 decubitus ulcer ([place]) 07/05/2015  . Stroke ([place])   . UTI (lower urinary tract infection)    Past Surgical History:  Procedure Laterality Date  . ESOPHAGOGASTRODUODENOSCOPY Left 01/02/2015   Procedure: ESOPHAGOGASTRODUODENOSCOPY (EGD);  Surgeon: Arta Silence, MD;  Location: [facility];  Service: Endoscopy;  Laterality: Left;  . HERNIA MESH REMOVAL    . TUBAL LIGATION    . tubes tided     Social History:   reports that she quit smoking about 13 months ago. Her smoking use included Cigarettes. She has a 20.00 pack-year smoking history. She has never used smokeless tobacco. She reports that she drinks about 3.0 oz of alcohol per week . She reports that she does not use drugs.  Family History  Problem Relation Age of Onset  . Diabetes Mother   . Alzheimer's disease Mother   . Diabetes Father   . Diabetes Sister   . Scoliosis Sister  Medications:   Medication List       Accurate as of 01/04/16  2:00 PM. Always use your most recent med list.          albuterol (2.5 MG/3ML) 0.083% nebulizer solution Commonly known as:  PROVENTIL Take 3 mLs (2.5 mg total) by  nebulization every 6 (six) hours as needed for wheezing or shortness of breath.   atorvastatin 10 MG tablet Commonly known as:  LIPITOR Take 10 mg by mouth daily.   baclofen 10 MG tablet Commonly known as:  LIORESAL Take 1 tablet (10 mg total) by mouth 3 (three) times daily.   famotidine 20 MG tablet Commonly known as:  PEPCID Take 1 tablet (20 mg total) by mouth 2 (two) times daily.   feeding supplement (ENSURE ENLIVE) Liqd Take 237 mLs by mouth 2 (two) times daily between meals.   feeding supplement (PRO-STAT SUGAR FREE 64) Liqd Take 30 mLs by mouth 2 (two) times daily.   gabapentin 100 MG capsule Commonly known as:  NEURONTIN Take 1 capsule (100 mg total) by mouth at bedtime.   glipiZIDE 5 MG tablet Commonly known as:  GLUCOTROL Take 5 mg by mouth 2 (two) times daily.   lisinopril 2.5 MG tablet Commonly known as:  PRINIVIL,ZESTRIL Take 2.5 mg by mouth daily.   multivitamin with minerals Tabs tablet Take 1 tablet by mouth daily.   Omega 3 1000 MG Caps Take 1,000 mg by mouth daily.   polyethylene glycol packet Commonly known as:  MIRALAX / GLYCOLAX Take 17 g by mouth 2 (two) times daily.   pravastatin 20 MG tablet Commonly known as:  PRAVACHOL Take 1 tablet (20 mg total) by mouth daily at 6 PM.   rivaroxaban 20 MG Tabs tablet Commonly known as:  XARELTO Take 1 tablet (20 mg total) by mouth daily with supper. Reported on 08/12/2015   senna-docusate 8.6-50 MG tablet Commonly known as:  Senokot-S Take 1 tablet by mouth 2 (two) times daily.   solifenacin 5 MG tablet Commonly known as:  VESICARE Take 1 tablet (5 mg total) by mouth daily.   vitamin C 500 MG tablet Commonly known as:  ASCORBIC ACID Take 500 mg by mouth daily.       Immunizations: Immunization History  Administered Date(s) Administered  . Influenza Split 06/26/2012  . Influenza-Unspecified 04/02/2015  . PPD Test 01/04/2015, 07/09/2015, 07/23/2015, 09/02/2015, 12/31/2015  .  Pneumococcal-Unspecified 03/13/2014     Physical Exam: Vitals:   01/04/16 1350  BP: 112/69  Pulse: 89  Resp: 20  Temp: 98.3 F (36.8 C)  TempSrc: Oral  SpO2: 92%  Weight: 140 lb 3.2 oz (63.6 kg)  Height: 5\' 3"  (1.6 m)   Body mass index is 24.84 kg/m.  General- adult female, well built, in no acute distress Head- normocephalic, atraumatic Nose- no nasal discharge Throat- moist mucus membrane  Eyes- PERRLA, EOMI, no pallor, no icterus, no discharge Neck- no cervical lymphadenopathy Cardiovascular- normal s1,s2, no murmur Respiratory- bilateral clear to auscultation, no wheeze, no rhonchi, no crackles, no use of accessory muscles Abdomen- bowel sounds present, soft, non tender, foley catheter present Musculoskeletal- generalized weakness, strength 3/5 in her lower extremities, RUE weakness 3-4/5 present Neurological- alert and oriented to person, place and time Skin- warm and dry, bilateral heel floaters, PICC line left arm  Psychiatry- normal mood and affect    Labs reviewed: Basic Metabolic Panel:  Recent Labs  11/11/15 1447  11/12/15 2334  11/15/15 [id number] 11/16/15 0334 11/17/15 [id number]  12/23/15 [id number]  12/25/15 0617 12/29/15 1355  NA  --   < >  --   < > 143 141 140  < > 137 137 137  K  --   < >  --   < > 3.3* 4.2 3.7  < > 4.0 4.2 3.9  CL  --   < >  --   < > 110 112* 110  < > 107 107 104  CO2  --   < >  --   < > 25 24 25   < > 27 26 28   GLUCOSE  --   < >  --   < > 121* 108* 93  < > 167* 130* 195*  BUN  --   < >  --   < > 16 15 13   < > 6 <5* 12  CREATININE 1.27*  < >  --   < > 0.61 0.61 0.54  < > 0.51 0.52 0.61  CALCIUM  --   < >  --   < > 9.6 9.2 9.5  < > 9.1 9.6 10.2  MG 1.7  --   --   < > 1.9 1.7 1.8  --   --   --   --   PHOS 4.3  --  1.8*  --   --   --  3.2  --   --   --   --   < > = values in this interval not displayed. Liver Function Tests:  Recent Labs  11/17/15 0709 12/18/15 1035 12/19/15 0744  AST 9* 15 12*  ALT 15 8* 7*  ALKPHOS 62 40 50  BILITOT  0.4 0.7 0.5  PROT 6.0* 6.6 6.6  ALBUMIN 2.0* 2.2* 2.3*   No results for input(s): LIPASE, AMYLASE in the last 8760 hours.  Recent Labs  07/20/15 1712  AMMONIA 21   CBC:  Recent Labs  12/27/15 0525 12/28/15 0414 12/29/15 1355 12/30/15 0510 12/30/15 2003  WBC 18.2* 17.6* 16.2* 16.7*  --   NEUTROABS 12.1* 12.1* 11.4*  --   --   HGB 7.1* 7.1* 7.0* 6.6* 9.4*  HCT 23.0* 22.9* 23.0* 21.3* 28.8*  MCV 84.6 85.8 85.5 85.2  --   PLT 619* 724* 809* 818*  --    Cardiac Enzymes:  Recent Labs  07/21/15 0310 07/21/15 0900 11/11/15 1627  TROPONINI <0.03 <0.03 <0.03   BNP: Invalid input(s): POCBNP CBG:  Recent Labs  12/30/15 1159 12/30/15 1643 12/30/15 2102  GLUCAP 219* 156* 163*    Radiological Exams: Ct Head Wo Contrast  Result Date: 12/18/2015 CLINICAL DATA:  Altered mental status. EXAM: CT HEAD WITHOUT CONTRAST TECHNIQUE: Contiguous axial images were obtained from the base of the skull through the vertex without intravenous contrast. COMPARISON:  CT 08/31/2015.  MRI 07/21/2015. FINDINGS: Generalized brain atrophy. Chronic small-vessel ischemic changes of the deep white matter. No sign of acute infarction, mass lesion, hemorrhage, hydrocephalus or extra-axial collection. No calvarial abnormality. Sinuses, middle ears and mastoids are clear. IMPRESSION: No acute finding. Atrophy and chronic small-vessel disease of the white matter. Electronically Signed   By: Nelson Chimes M.D.   On: 12/18/2015 11:50   Ct Abdomen Pelvis W Contrast  Result Date: 12/22/2015 CLINICAL DATA:  55 year old female admitted 12/18/2015 with urinary tract infection and altered mental status. Acute renal failure. Prior ultrasound exam 12/18/2015 demonstrates evidence of pyelonephritis, left nephrolithiasis, and hydronephrosis. EXAM: CT ABDOMEN AND PELVIS WITH CONTRAST TECHNIQUE: Multidetector CT imaging of the abdomen and pelvis was performed using the  standard protocol following bolus administration of  intravenous contrast. CONTRAST:  119mL ISOVUE-300 IOPAMIDOL (ISOVUE-300) INJECTION 61% COMPARISON:  Ultrasound 12/18/2015, CT 12/15/2014 FINDINGS: Lower chest: Unremarkable appearance of the soft tissues of the chest wall. Heart size within normal limits.  No pericardial fluid/thickening. No lower mediastinal adenopathy. Unremarkable appearance of the distal esophagus. Small hiatal hernia Dependent atelectasis with trace bilateral pleural effusions. Abdomen/pelvis: Unremarkable appearance of liver and spleen. Unremarkable bilateral adrenal glands. Unremarkable pancreas. Unremarkable gallbladder. Distended stomach, duodenum, and jejunum, with multiple air-fluid levels and dilated bowel loops. There is decompression of distal ileum in the right lower quadrant, with a transition on image 53. Mild to moderate stool burden.  Colonic diverticula. Within the low abdomen/pelvis there are ill-defined inflammatory changes with contracted appearance of the local anatomy including sigmoid colon, dome of the urinary bladder, adjacent small bowel loops, mesenteric, and the abdominal wall musculature (image 83). Small amount of free-fluid, with obscuration of the fat planes. Trace fluid throughout the abdomen.  No free air identified. Configuration the right kidney collecting system unchanged from the comparison CT, with extrarenal pelvis and mild dilation the proximal ureter. Unremarkable course of the right ureter with no stone identified. Similar appearance of left-sided nephrolithiasis with unchanged appearance of the dilated collecting system. The stones at the left ureteropelvic junction have increased in size. Unchanged position of distal left ureteral stone, just above the ureterovesical junction. This stone is slightly increased in size now measuring 11 mm. Significant inflammatory changes associated with the left collecting system, with thickening and enhancement of the proximal left ureter and ureterovesical junction.  Symmetric perfusion of the kidneys maintained. Urinary bladder is decompressed with urinary catheter in situ. Gas within the dome of the urinary bladder, nonspecific. The dome of the bladder is indistinguishable from inflammatory changes in the low abdomen/ pelvis, described above. Multiple retroperitoneal lymph nodes. Fibroid changes of the uterus. Scattered calcifications of the abdominal aorta. No dissection or aneurysm. Atherosclerotic changes at the origin of the mesenteric vessels, with no significant stenosis or occlusion identified. Musculoskeletal: Degenerative changes of the spine. No significant bony canal narrowing. No displaced fracture. IMPRESSION: Evidence of developing bowel obstruction or ileus, with transition point in the distal ileum. Etiology may be secondary to adhesion of the right lower quadrant, or could potentially be reactive given the significant inflammatory changes involving the urinary system and the urinary bladder. Ill-defined inflammatory/ infectious changes of the low abdomen/ pelvis, where several viscera are involved. Inflammatory changes involve the dome of the urinary bladder, distal small bowel loops, and the sigmoid colon, with a source involving any of these. Favored etiology is secondary to chronic urinary tract infection given the imaging appearance over time and left-sided obstructive nephrolithiasis, however, diverticulitis not excluded. Persistent at least partially obstructive left-sided nephrolithiasis, with stones at the ureteropelvic junction and in the distal ureter just above the left ureterovesical junction. Inflammatory changes at the hilum of the left kidney have worsened since the comparison CT, compatible with pyelonephritis/urinary tract infection. Associated lymph nodes of the retroperitoneum. Aortic atherosclerosis. Trace bilateral pleural effusions. Signed, Dulcy Fanny. Earleen Newport, DO [facility]  Electronically Signed   By: Corrie Mckusick D.O.   On: 12/22/2015 07:45   US Renal  Result Date: 12/18/2015 CLINICAL DATA:  Altered mental status.  UTI. EXAM: RENAL / URINARY TRACT ULTRASOUND COMPLETE COMPARISON:  07/21/2015 FINDINGS: Right Kidney: Length: 14 cm. Mild right hydronephrosis, similar to 07/21/2015 sonogram and abdominal CT 12/15/2014. Echogenic cortex Left Kidney: Length: 15.7  cm. Confluent calculi in the renal pelvis with the largest discrete stone measuring 22 mm. There is chronic hydronephrosis. Urinary collecting system debris. A non shadowing rounded echogenic structure in the collecting system superiorly is likely also debris. Lower pole cyst, both hilar and cortical with bilobed appearance, up to 73 mm in diameter and stable/simple. Bladder: History of Foley catheter.  The bladder is collapsed. IMPRESSION: 1. Collecting system debris on the left as seen with infection or hemorrhage. Asymmetric enlargement of the left kidney, a sign seen with pyelonephritis. No indication of abscess. 2. Chronic mild bilateral hydronephrosis. 3. Extensive left nephrolithiasis. 4. Chronically enlarged and echogenic kidneys, favored secondary to patient's diabetes. Electronically Signed   By: Monte Fantasia M.D.   On: 12/18/2015 17:14   Ir US Guide Vasc Access Left  Result Date: 12/25/2015 INDICATION: Sepsis, urinary tract infection and obstructive uropathy on the left with central pelvic calculi and additional distal left ureteral calculus. Left-sided percutaneous nephrostomy has been requested for decompression. IV access was lost and there has been inability to re-establish IV access for conscious sedation. EXAM: 1. ULTRASOUND-GUIDED VASCULAR ACCESS OF THE LEFT BRACHIAL VEIN WITH IV ACCESS PLACEMENT 2. LEFT PERCUTANEOUS NEPHROSTOMY TUBE PLACEMENT WITH ULTRASOUND GUIDANCE IR NEPHROSTOMY PLACEMENT LEFT; IR RADIOLOGY PERIPHERAL GUIDED IV START; IR ULTRASOUND GUIDANCE VASC ACCESS LEFT COMPARISON:  CT of the  abdomen and pelvis on 12/22/2015 MEDICATIONS: 2 g IV Ancef. As antibiotic prophylaxis, Ancef was ordered pre-procedure and administered intravenously within one hour of incision. ANESTHESIA/SEDATION: Fentanyl 75 mcg IV; Versed 1.5 mg IV Moderate Sedation Time:  20 minutes. The patient was continuously monitored during the procedure by the interventional radiology nurse under my direct supervision. CONTRAST:  20 mL Isovue-300 - administered into the collecting system(s) FLUOROSCOPY TIME:  Fluoroscopy Time: 2 minutes and 12 seconds. COMPLICATIONS: None immediate. PROCEDURE: Informed written consent was obtained from the patient after a thorough discussion of the procedural risks, benefits and alternatives. All questions were addressed. Maximal Sterile Barrier Technique was utilized including caps, mask, sterile gowns, sterile gloves, sterile drape, hand hygiene and skin antiseptic. A timeout was performed prior to the initiation of the procedure. Ultrasound was used to confirm patency of the left brachial vein. Under direct ultrasound guidance, a 21 gauge needle was advanced into the vein. After securing guidewire access, a 4 French micropuncture dilator was advanced into the vein. This is secured for use for IV access. The patient was placed in a prone position and sonographic localization of the left kidney performed. Skin was prepped with chlorhexidine. Local anesthesia was provided with 1% lidocaine. Under ultrasound guidance, a 21 gauge needle was advanced into the left kidney. Contrast injection was performed under fluoroscopy. Additional access of the left kidney was then performed under ultrasound guidance with a second 21 gauge needle into the upper pole collecting system. Contrast injection was performed under fluoroscopy. A guidewire was advanced into the collecting system. A transitional dilator was placed. A guidewire was then advanced into the collecting system. Percutaneous access was dilated and a 10  French percutaneous nephrostomy tube advanced. The catheter was formed and injected with contrast material to confirm position. The catheter was then flushed and connected to a gravity drainage bag. The tube was secured at the skin with a Prolene retention suture and StatLock device. FINDINGS: Initial puncture of a dumbbell-shaped left renal cyst was performed. Contrast injection confirms that this is a large cyst with no communication with the collecting system. Two adjacent large calculi are identified in the renal  pelvis. After separate upper pole access, opacification of the collecting system was accomplished. This demonstrates 2 calculi in the renal pelvis occupying most of the space of the renal pelvis. There is flow of contrast past the calculi and into the ureter. Degree of hydronephrosis is relatively mild. The renal cysts cause splaying of the interpolar and lower pole collecting system. Via upper pole access, a nephrostomy tube was placed and advanced into the renal pelvis. IMPRESSION: 1. Ultrasound-guided venous access of the left brachial vein performed. 2. Left-sided percutaneous nephrostomy tube placement, as above. Initial access of a large dumbbell-shaped renal cyst was performed with contrast injection showing no communication with the collecting system. After upper pole access, a 10 French nephrostomy tube was placed and advanced to the renal pelvis. This was connected to a gravity drainage bag. Electronically Signed   By: Aletta Edouard M.D.   On: 12/25/2015 16:54   Dg Chest Port 1 View  Result Date: 12/30/2015 CLINICAL DATA:  55 year old female PICC line placement. Initial encounter. EXAM: PORTABLE CHEST 1 VIEW COMPARISON:  12/18/2015 and earlier. FINDINGS: Portable AP semi upright view at at 1101 hours. Left upper extremity approach PICC line has been placed and the tip courses to the lower SVC level, however there is a loop in the right superior mediastinum, likely within the innominate  vein confluence (arrow). Improved lung volumes. No pneumothorax. Allowing for portable technique, the lungs are clear. Normal cardiac size and mediastinal contours. Visualized tracheal air column is within normal limits. Partially visible left upper quadrant catheter. IMPRESSION: 1. Left upper extremity approach PICC line is looped in the superior right mediastinum likely at the innominate vein confluence. Recommend repositioning. The tip currently is at the SVC level. 2.  No acute cardiopulmonary abnormality. Electronically Signed   By: Genevie Ann M.D.   On: 12/30/2015 11:26   Dg Chest Port 1 View  Result Date: 12/18/2015 CLINICAL DATA:  Weakness, altered mental status EXAM: PORTABLE CHEST 1 VIEW COMPARISON:  Chest x-rays dated 11/11/2015 and 12/14/2014. FINDINGS: Heart size is normal. Overall cardiomediastinal silhouette is normal in size and configuration. Study is hypoinspiratory with crowding of the perihilar bronchovascular markings. Given the low lung volumes, lungs appear clear. Questionable mild atelectasis or small pleural effusion at the left costophrenic angle. IMPRESSION: Questionable mild atelectasis or small pleural effusion at the left costophrenic angle. Lungs otherwise clear. No evidence of pneumonia. No large pleural effusion. Heart size normal. Electronically Signed   By: Franki Cabot M.D.   On: 12/18/2015 11:17   Dg Abd 2 Views  Result Date: 12/20/2015 CLINICAL DATA:  Abdominal distention EXAM: ABDOMEN - 2 VIEW COMPARISON:  11/11/2015 abdominal radiograph FINDINGS: Diffuse gaseous distention of the small and large bowel. No evidence of pneumatosis or pneumoperitoneum. Clustered 28 x 11 mm and 22 x 11 mm left renal stones appear stable. Clustered 19 x 6 mm and 4 mm left deep pelvic calcifications, cannot exclude distal left ureteral stones. IMPRESSION: 1. Diffuse gaseous distention of the small and large bowel, most suggestive of diffuse adynamic ileus. Distal small bowel obstruction cannot  be excluded. If there is clinical concern for small bowel obstruction, consider correlation with CT abdomen/pelvis with oral and IV contrast. 2. Left renal stones.  Possible distal left ureteral stones. Electronically Signed   By: Ilona Sorrel M.D.   On: 12/20/2015 11:48   Ir Radiology Peripheral Guided Iv Start  Result Date: 12/25/2015 INDICATION: Sepsis, urinary tract infection and obstructive uropathy on the left with central pelvic calculi  and additional distal left ureteral calculus. Left-sided percutaneous nephrostomy has been requested for decompression. IV access was lost and there has been inability to re-establish IV access for conscious sedation. EXAM: 1. ULTRASOUND-GUIDED VASCULAR ACCESS OF THE LEFT BRACHIAL VEIN WITH IV ACCESS PLACEMENT 2. LEFT PERCUTANEOUS NEPHROSTOMY TUBE PLACEMENT WITH ULTRASOUND GUIDANCE IR NEPHROSTOMY PLACEMENT LEFT; IR RADIOLOGY PERIPHERAL GUIDED IV START; IR ULTRASOUND GUIDANCE VASC ACCESS LEFT COMPARISON:  CT of the abdomen and pelvis on 12/22/2015 MEDICATIONS: 2 g IV Ancef. As antibiotic prophylaxis, Ancef was ordered pre-procedure and administered intravenously within one hour of incision. ANESTHESIA/SEDATION: Fentanyl 75 mcg IV; Versed 1.5 mg IV Moderate Sedation Time:  20 minutes. The patient was continuously monitored during the procedure by the interventional radiology nurse under my direct supervision. CONTRAST:  20 mL Isovue-300 - administered into the collecting system(s) FLUOROSCOPY TIME:  Fluoroscopy Time: 2 minutes and 12 seconds. COMPLICATIONS: None immediate. PROCEDURE: Informed written consent was obtained from the patient after a thorough discussion of the procedural risks, benefits and alternatives. All questions were addressed. Maximal Sterile Barrier Technique was utilized including caps, mask, sterile gowns, sterile gloves, sterile drape, hand hygiene and skin antiseptic. A timeout was performed prior to the initiation of the procedure. Ultrasound was  used to confirm patency of the left brachial vein. Under direct ultrasound guidance, a 21 gauge needle was advanced into the vein. After securing guidewire access, a 4 French micropuncture dilator was advanced into the vein. This is secured for use for IV access. The patient was placed in a prone position and sonographic localization of the left kidney performed. Skin was prepped with chlorhexidine. Local anesthesia was provided with 1% lidocaine. Under ultrasound guidance, a 21 gauge needle was advanced into the left kidney. Contrast injection was performed under fluoroscopy. Additional access of the left kidney was then performed under ultrasound guidance with a second 21 gauge needle into the upper pole collecting system. Contrast injection was performed under fluoroscopy. A guidewire was advanced into the collecting system. A transitional dilator was placed. A guidewire was then advanced into the collecting system. Percutaneous access was dilated and a 10 French percutaneous nephrostomy tube advanced. The catheter was formed and injected with contrast material to confirm position. The catheter was then flushed and connected to a gravity drainage bag. The tube was secured at the skin with a Prolene retention suture and StatLock device. FINDINGS: Initial puncture of a dumbbell-shaped left renal cyst was performed. Contrast injection confirms that this is a large cyst with no communication with the collecting system. Two adjacent large calculi are identified in the renal pelvis. After separate upper pole access, opacification of the collecting system was accomplished. This demonstrates 2 calculi in the renal pelvis occupying most of the space of the renal pelvis. There is flow of contrast past the calculi and into the ureter. Degree of hydronephrosis is relatively mild. The renal cysts cause splaying of the interpolar and lower pole collecting system. Via upper pole access, a nephrostomy tube was placed and  advanced into the renal pelvis. IMPRESSION: 1. Ultrasound-guided venous access of the left brachial vein performed. 2. Left-sided percutaneous nephrostomy tube placement, as above. Initial access of a large dumbbell-shaped renal cyst was performed with contrast injection showing no communication with the collecting system. After upper pole access, a 10 French nephrostomy tube was placed and advanced to the renal pelvis. This was connected to a gravity drainage bag. Electronically Signed   By: Aletta Edouard M.D.   On: 12/25/2015 16:54  Ir Nephrostomy Placement Left  Result Date: 12/25/2015 INDICATION: Sepsis, urinary tract infection and obstructive uropathy on the left with central pelvic calculi and additional distal left ureteral calculus. Left-sided percutaneous nephrostomy has been requested for decompression. IV access was lost and there has been inability to re-establish IV access for conscious sedation. EXAM: 1. ULTRASOUND-GUIDED VASCULAR ACCESS OF THE LEFT BRACHIAL VEIN WITH IV ACCESS PLACEMENT 2. LEFT PERCUTANEOUS NEPHROSTOMY TUBE PLACEMENT WITH ULTRASOUND GUIDANCE IR NEPHROSTOMY PLACEMENT LEFT; IR RADIOLOGY PERIPHERAL GUIDED IV START; IR ULTRASOUND GUIDANCE VASC ACCESS LEFT COMPARISON:  CT of the abdomen and pelvis on 12/22/2015 MEDICATIONS: 2 g IV Ancef. As antibiotic prophylaxis, Ancef was ordered pre-procedure and administered intravenously within one hour of incision. ANESTHESIA/SEDATION: Fentanyl 75 mcg IV; Versed 1.5 mg IV Moderate Sedation Time:  20 minutes. The patient was continuously monitored during the procedure by the interventional radiology nurse under my direct supervision. CONTRAST:  20 mL Isovue-300 - administered into the collecting system(s) FLUOROSCOPY TIME:  Fluoroscopy Time: 2 minutes and 12 seconds. COMPLICATIONS: None immediate. PROCEDURE: Informed written consent was obtained from the patient after a thorough discussion of the procedural risks, benefits and alternatives. All  questions were addressed. Maximal Sterile Barrier Technique was utilized including caps, mask, sterile gowns, sterile gloves, sterile drape, hand hygiene and skin antiseptic. A timeout was performed prior to the initiation of the procedure. Ultrasound was used to confirm patency of the left brachial vein. Under direct ultrasound guidance, a 21 gauge needle was advanced into the vein. After securing guidewire access, a 4 French micropuncture dilator was advanced into the vein. This is secured for use for IV access. The patient was placed in a prone position and sonographic localization of the left kidney performed. Skin was prepped with chlorhexidine. Local anesthesia was provided with 1% lidocaine. Under ultrasound guidance, a 21 gauge needle was advanced into the left kidney. Contrast injection was performed under fluoroscopy. Additional access of the left kidney was then performed under ultrasound guidance with a second 21 gauge needle into the upper pole collecting system. Contrast injection was performed under fluoroscopy. A guidewire was advanced into the collecting system. A transitional dilator was placed. A guidewire was then advanced into the collecting system. Percutaneous access was dilated and a 10 French percutaneous nephrostomy tube advanced. The catheter was formed and injected with contrast material to confirm position. The catheter was then flushed and connected to a gravity drainage bag. The tube was secured at the skin with a Prolene retention suture and StatLock device. FINDINGS: Initial puncture of a dumbbell-shaped left renal cyst was performed. Contrast injection confirms that this is a large cyst with no communication with the collecting system. Two adjacent large calculi are identified in the renal pelvis. After separate upper pole access, opacification of the collecting system was accomplished. This demonstrates 2 calculi in the renal pelvis occupying most of the space of the renal pelvis.  There is flow of contrast past the calculi and into the ureter. Degree of hydronephrosis is relatively mild. The renal cysts cause splaying of the interpolar and lower pole collecting system. Via upper pole access, a nephrostomy tube was placed and advanced into the renal pelvis. IMPRESSION: 1. Ultrasound-guided venous access of the left brachial vein performed. 2. Left-sided percutaneous nephrostomy tube placement, as above. Initial access of a large dumbbell-shaped renal cyst was performed with contrast injection showing no communication with the collecting system. After upper pole access, a 10 French nephrostomy tube was placed and advanced to the renal pelvis. This  was connected to a gravity drainage bag. Electronically Signed   By: Aletta Edouard M.D.   On: 12/25/2015 16:54    Assessment/Plan  Physical deconditioning Will have her work with physical therapy and occupational therapy team to help with gait training and muscle strengthening exercises.fall precautions. Skin care. Encourage to be out of bed.   Staphylococcus bacteremia Completed her course of vancomycin and rocephin 01/01/16. Afebrile. Remove picc line. Monitor clinically  Catheter associated UTI Has completed her antibiotics, encourage hydration and provide foley care  Anemia of chronic disease S/p 2 u prbc transfusion in hospital. Monitor cbc  Multiple sclerosis Continue baclofen 10 mg tid, continue supportive care  gerd Stable, continue pepcid 20 mg bid  Protein calorie malnutrition Get RD consult. Continue feeding supplement and multivitamin  Neuropathic pain Continue her gabapentin  DM type 2 Lab Results  Component Value Date   HGBA1C 6.0 (H) 12/18/2015   Monitor cbg, continue glipizide  HTN Stable bp, monitor, continue lisinopril 2.5 mg daily  Slow transit constipation Continue miralax 17 g bid and senokot s bid  HLD Continue pravastatin  Neurogenic bladder Continue vesicare and foley catheter with  foley care  DVT Continue xarelto and monitor  Left nephrolithiasis S/p left nephrostomy tube, to follow up with renal     Goals of care: short term rehabilitation   Labs/tests ordered:  Family/ staff Communication: reviewed care plan with patient and nursing supervisor    Blanchie Serve, MD [facility] [address] [place], [place] [address] Cell Phone (Monday-Friday 8 am - 5 pm): [phone] On Call: [phone] and follow prompts after 5 pm and on weekends Office Phone: [phone] Office Fax: [phone]

## 2016-01-06 LAB — BASIC METABOLIC PANEL
BUN: 19 mg/dL (ref 4–21)
Creatinine: 0.5 mg/dL (ref 0.5–1.1)
Glucose: 130 mg/dL
Potassium: 3.7 mmol/L (ref 3.4–5.3)
Sodium: 139 mmol/L (ref 137–147)

## 2016-01-06 LAB — CBC AND DIFFERENTIAL
HEMATOCRIT: 31 % — AB (ref 36–46)
HEMOGLOBIN: 9.7 g/dL — AB (ref 12.0–16.0)
PLATELETS: 610 10*3/uL — AB (ref 150–399)
WBC: 104 10*3/mL

## 2016-01-06 LAB — HEPATIC FUNCTION PANEL
ALT: 17 U/L (ref 7–35)
AST: 12 U/L — AB (ref 13–35)
Alkaline Phosphatase: 67 U/L (ref 25–125)
BILIRUBIN, TOTAL: 0.4 mg/dL

## 2016-01-11 ENCOUNTER — Encounter: Payer: Self-pay | Admitting: Family

## 2016-01-11 ENCOUNTER — Non-Acute Institutional Stay (SKILLED_NURSING_FACILITY): Payer: Medicare Other | Admitting: Family

## 2016-01-11 DIAGNOSIS — Z9289 Personal history of other medical treatment: Secondary | ICD-10-CM | POA: Diagnosis not present

## 2016-01-11 DIAGNOSIS — M792 Neuralgia and neuritis, unspecified: Secondary | ICD-10-CM

## 2016-01-11 DIAGNOSIS — G35 Multiple sclerosis: Secondary | ICD-10-CM

## 2016-01-11 DIAGNOSIS — E440 Moderate protein-calorie malnutrition: Secondary | ICD-10-CM | POA: Diagnosis not present

## 2016-01-11 DIAGNOSIS — L609 Nail disorder, unspecified: Secondary | ICD-10-CM

## 2016-01-11 DIAGNOSIS — E118 Type 2 diabetes mellitus with unspecified complications: Secondary | ICD-10-CM

## 2016-01-11 DIAGNOSIS — E785 Hyperlipidemia, unspecified: Secondary | ICD-10-CM | POA: Diagnosis not present

## 2016-01-11 DIAGNOSIS — K219 Gastro-esophageal reflux disease without esophagitis: Secondary | ICD-10-CM | POA: Diagnosis not present

## 2016-01-11 DIAGNOSIS — L602 Onychogryphosis: Secondary | ICD-10-CM

## 2016-01-11 DIAGNOSIS — Z436 Encounter for attention to other artificial openings of urinary tract: Secondary | ICD-10-CM | POA: Diagnosis not present

## 2016-01-11 DIAGNOSIS — Z960 Presence of urogenital implants: Secondary | ICD-10-CM

## 2016-01-11 DIAGNOSIS — D638 Anemia in other chronic diseases classified elsewhere: Secondary | ICD-10-CM | POA: Diagnosis not present

## 2016-01-11 DIAGNOSIS — E1169 Type 2 diabetes mellitus with other specified complication: Secondary | ICD-10-CM

## 2016-01-11 DIAGNOSIS — L850 Acquired ichthyosis: Secondary | ICD-10-CM

## 2016-01-11 DIAGNOSIS — Z978 Presence of other specified devices: Secondary | ICD-10-CM

## 2016-01-11 DIAGNOSIS — L853 Xerosis cutis: Secondary | ICD-10-CM

## 2016-01-11 NOTE — Progress Notes (Signed)
This encounter was created in error - please disregard.

## 2016-01-11 NOTE — Progress Notes (Signed)
Patient ID: [name], female   DOB: 15-Apr-1961, 55 y.o.   MRN: [id number]   Location:  [facility] Room Number: [id number] Place of Service:  SNF (31)  Provider: Marlowe Sax, FNP-C  PCP: No primary care provider on file. Patient Care Team: [place] ([place])  Extended Emergency Contact Information Primary Emergency Contact: [place], [place] [address] Johnnette Litter of [place] Phone: [phone] Mobile Phone: [phone] Relation: Relative Secondary Emergency Contact: Charmayne Sheer, [place] [address] [place] of [place] Phone: [phone] Relation: Relative  Code Status: Full Code Goals of care:  Advanced Directive information Advanced Directives 12/27/2015  Does patient have an advance directive? No  Does patient want to make changes to advanced directive? -  Copy of advanced directive(s) in chart? -  Would patient like information on creating an advanced directive? -  Pre-existing out of facility DNR order (yellow form or pink MOST form) -     No Known Allergies  Chief Complaint  Patient presents with  . Discharge Note    Discharge from facility    HPI:  55 y.o. female seen today at [facility] for discharge home. She was here for short term rehabilitation post hospital admission from 12/18/15-12/30/15 with catheter associated UTI , left pyelonephritis and staphylococcus bacteremia.She had undergone left percutaneous nephrostomy tube placement on 12/25/15 for left sided obstructive nephrolithiasis. She was placed on iv vancomycin and rocephin through 01/01/16. She had acute blood loss anemia and required 2 u prbc transfusion. She had acute encephalopathy in setting of infection and impaired renal function that improved with iv fluids and antibiotics.She has a significant medical history of multiple sclerosis,CKD, CVA among other conditions.She is seen  in her room lying in bed watching TV. She denies any acute issues this visit.She has worked well with PT/OT now stable for discharge home.She will be discharged home with Home health PT/OT to continue with ROM, Exercise and muscle strengthening. She does not require any DME states has own hospital bed at home.She will need a HH RN for foley catheter and left Nephrostomy management.She will also need a HH Aid to assist with ADL's.Home health services will be arranged by facility social worker prior to discharge.Will discharge home with Meds from the facility. Prescription medication will be written x 1 month then patient to follow up with PCP in 1-2 weeks.Facility staff report no new concerns.       Past Medical History:  Diagnosis Date  . Acute renal failure superimposed on stage 3 chronic kidney disease ([place]) 07/21/2015  . Diabetes mellitus without complication ([place])   . DVT (deep venous thrombosis) ([place])   . Malnutrition of moderate degree 07/21/2015  . Multiple sclerosis diagnosed 2002-not on Therapy any longer 06/26/2012  . Stage 4 decubitus ulcer ([place]) 07/05/2015  . Stroke ([place])   . UTI (lower urinary tract infection)     Past Surgical History:  Procedure Laterality Date  . ESOPHAGOGASTRODUODENOSCOPY Left 01/02/2015   Procedure: ESOPHAGOGASTRODUODENOSCOPY (EGD);  Surgeon: Arta Silence, MD;  Location: [facility];  Service: Endoscopy;  Laterality: Left;  . HERNIA MESH REMOVAL    . TUBAL LIGATION    . tubes tided        reports that she quit smoking about 13 months ago. Her smoking use included Cigarettes. She has a 20.00 pack-year smoking history. She has  never used smokeless tobacco. She reports that she drinks about 3.0 oz of alcohol per week . She reports that she does not use drugs. Social History   Social History  . Marital status: Single    Spouse name: N/A  . Number of children: N/A  . Years of education: N/A   Occupational History  . Not on file.   Social History Main  Topics  . Smoking status: Former Smoker    Packs/day: 1.00    Years: 20.00    Types: Cigarettes    Quit date: 11/19/2014  . Smokeless tobacco: Never Used  . Alcohol use 3.0 oz/week    5 Glasses of wine per week  . Drug use: No  . Sexual activity: Not Currently   Other Topics Concern  . 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               No Known Allergies  Pertinent  Health Maintenance Due  Topic Date Due  . FOOT EXAM  10/19/1968  . OPHTHALMOLOGY EXAM  10/19/1968  . PAP SMEAR  10/20/1979  . COLONOSCOPY  10/19/2008  . MAMMOGRAM  09/19/2015  . INFLUENZA VACCINE  08/01/2016 (Originally 01/12/2016)  . HEMOGLOBIN A1C  06/19/2016    Medications:   Medication List       Accurate as of 01/11/16 11:08 AM. Always use your most recent med list.          albuterol (2.5 MG/3ML) 0.083% nebulizer solution Commonly known as:  PROVENTIL Take 3 mLs (2.5 mg total) by nebulization every 6 (six) hours as needed for wheezing or shortness of breath.   atorvastatin 10 MG tablet Commonly known as:  LIPITOR Take 10 mg by mouth daily.   baclofen 10 MG tablet Commonly known as:  LIORESAL Take 1 tablet (10 mg total) by mouth 3 (three) times daily.   famotidine 20 MG tablet Commonly known as:  PEPCID Take 1 tablet (20 mg total) by mouth 2 (two) times daily.   feeding supplement (ENSURE ENLIVE) Liqd Take 237 mLs by mouth 2 (two) times daily between meals.   feeding supplement (PRO-STAT SUGAR FREE 64) Liqd Take 30 mLs by mouth 2 (two) times daily.   gabapentin 100 MG capsule Commonly known as:  NEURONTIN Take 1 capsule (100 mg total) by mouth at bedtime.   glipiZIDE 5 MG tablet Commonly known as:  GLUCOTROL Take 5 mg by mouth 2 (two) times daily.   lisinopril 2.5 MG tablet Commonly known as:  PRINIVIL,ZESTRIL Take 2.5 mg by mouth daily.     multivitamin with minerals Tabs tablet Take 1 tablet by mouth daily.   Omega 3 1000 MG Caps Take 1,000 mg by mouth daily.   polyethylene glycol packet Commonly known as:  MIRALAX / GLYCOLAX Take 17 g by mouth 2 (two) times daily.   rivaroxaban 20 MG Tabs tablet Commonly known as:  XARELTO Take 1 tablet (20 mg total) by mouth daily with supper. Reported on 08/12/2015   senna-docusate 8.6-50 MG tablet Commonly known as:  Senokot-S Take 1 tablet by mouth 2 (two) times daily.   solifenacin 5 MG tablet Commonly known as:  VESICARE Take 1 tablet (5 mg total) by mouth daily.   vitamin C 500 MG tablet Commonly known as:  ASCORBIC ACID Take 500 mg by mouth daily.  Review of Systems  Constitutional: Negative for activity change, appetite change, chills, fatigue and fever.  HENT: Negative for congestion, rhinorrhea, sinus pressure, sneezing and sore throat.   Eyes: Negative.   Respiratory: Negative for cough, chest tightness, shortness of breath and wheezing.   Cardiovascular: Negative for chest pain, palpitations and leg swelling.  Gastrointestinal: Negative for abdominal distention, abdominal pain, constipation, diarrhea, nausea and vomiting.  Endocrine: Negative.   Genitourinary: Negative for urgency.       Foley Catheter and Left Nephrostomy   Musculoskeletal: Positive for gait problem.  Skin: Negative for color change, pallor, rash and wound.  Neurological: Negative for dizziness, seizures, syncope, light-headedness, numbness and headaches.  Psychiatric/Behavioral: Negative for agitation, confusion, hallucinations and sleep disturbance. The patient is not nervous/anxious.     Vitals:   01/11/16 1053  BP: 116/65  Pulse: 76  Resp: 20  Temp: 98 F (36.7 C)  TempSrc: Oral  SpO2: 95%  Weight: 140 lb (63.5 kg)  Height: 5\' 3"  (1.6 m)   Body mass index is 24.8 kg/m. Physical Exam  Constitutional: She is oriented to person, place, and time.  Thin, frail in no acute  distress  HENT:  Head: Normocephalic.  Mouth/Throat: Oropharynx is clear and moist. No oropharyngeal exudate.  Eyes: Conjunctivae and EOM are normal. Pupils are equal, round, and reactive to light. Right eye exhibits no discharge. Left eye exhibits no discharge. No scleral icterus.  Neck: Normal range of motion. No JVD present. No thyromegaly present.  Cardiovascular: Normal rate, regular rhythm, normal heart sounds and intact distal pulses.  Exam reveals no gallop and no friction rub.   No murmur heard. Pulmonary/Chest: Effort normal and breath sounds normal. No respiratory distress. She has no wheezes. She has no rales.  Abdominal: Soft. Bowel sounds are normal. She exhibits no distension. There is no tenderness. There is no rebound and no guarding.  Genitourinary:  Genitourinary Comments: Foley cathter draining adequate amounts of yellow clear urine. Left Nephrostomy draining yellow clear urine.   Musculoskeletal: She exhibits no edema, tenderness or deformity.  RUE /Bilateral LE's weakness   Lymphadenopathy:    She has no cervical adenopathy.  Neurological: She is oriented to person, place, and time.  Skin: Skin is warm and dry. No rash noted. No erythema. No pallor.  Left Nephrostomy tube drsg dry clean and intact.Over grown toenails. Bilateral Lower extremity skin dark in color, thick and dry.   Psychiatric: She has a normal mood and affect.    Labs reviewed: Basic Metabolic Panel:  Recent Labs  11/11/15 1447  11/12/15 2334  11/15/15 [id number] 11/16/15 0334 11/17/15 0709  12/23/15 0219 12/25/15 0617 12/29/15 1355 01/06/16  NA  --   < >  --   < > 143 141 140  < > 137 137 137 139  K  --   < >  --   < > 3.3* 4.2 3.7  < > 4.0 4.2 3.9 3.7  CL  --   < >  --   < > 110 112* 110  < > 107 107 104  --   CO2  --   < >  --   < > 25 24 25   < > 27 26 28   --   GLUCOSE  --   < >  --   < > 121* 108* 93  < > 167* 130* 195*  --   BUN  --   < >  --   < > 16 15 13   < >  6 <5* 12 19  CREATININE  1.27*  < >  --   < > 0.61 0.61 0.54  < > 0.51 0.52 0.61 0.5  CALCIUM  --   < >  --   < > 9.6 9.2 9.5  < > 9.1 9.6 10.2  --   MG 1.7  --   --   < > 1.9 1.7 1.8  --   --   --   --   --   PHOS 4.3  --  1.8*  --   --   --  3.2  --   --   --   --   --   < > = values in this interval not displayed. Liver Function Tests:  Recent Labs  11/17/15 0709 12/18/15 1035 12/19/15 0744 01/06/16  AST 9* 15 12* 12*  ALT 15 8* 7* 17  ALKPHOS 62 40 50 67  BILITOT 0.4 0.7 0.5  --   PROT 6.0* 6.6 6.6  --   ALBUMIN 2.0* 2.2* 2.3*  --    No results for input(s): LIPASE, AMYLASE in the last 8760 hours.  Recent Labs  07/20/15 1712  AMMONIA 21   CBC:  Recent Labs  12/27/15 0525 12/28/15 0414 12/29/15 1355 12/30/15 0510 12/30/15 2003 01/06/16  WBC 18.2* 17.6* 16.2* 16.7*  --  104.0  NEUTROABS 12.1* 12.1* 11.4*  --   --   --   HGB 7.1* 7.1* 7.0* 6.6* 9.4* 9.7*  HCT 23.0* 22.9* 23.0* 21.3* 28.8* 31*  MCV 84.6 85.8 85.5 85.2  --   --   PLT 619* 724* 809* 818*  --  610*   Cardiac Enzymes:  Recent Labs  07/21/15 0310 07/21/15 0900 11/11/15 1627  TROPONINI <0.03 <0.03 <0.03   BNP: Invalid input(s): POCBNP CBG:  Recent Labs  12/30/15 1159 12/30/15 1643 12/30/15 2102  GLUCAP 219* 156* 163*    Procedures and Imaging Studies During Stay: Ct Head Wo Contrast  Result Date: 12/18/2015 CLINICAL DATA:  Altered mental status. EXAM: CT HEAD WITHOUT CONTRAST TECHNIQUE: Contiguous axial images were obtained from the base of the skull through the vertex without intravenous contrast. COMPARISON:  CT 08/31/2015.  MRI 07/21/2015. FINDINGS: Generalized brain atrophy. Chronic small-vessel ischemic changes of the deep white matter. No sign of acute infarction, mass lesion, hemorrhage, hydrocephalus or extra-axial collection. No calvarial abnormality. Sinuses, middle ears and mastoids are clear. IMPRESSION: No acute finding. Atrophy and chronic small-vessel disease of the white matter. Electronically Signed    By: Nelson Chimes M.D.   On: 12/18/2015 11:50   Ct Abdomen Pelvis W Contrast  Result Date: 12/22/2015 CLINICAL DATA:  56 year old female admitted 12/18/2015 with urinary tract infection and altered mental status. Acute renal failure. Prior ultrasound exam 12/18/2015 demonstrates evidence of pyelonephritis, left nephrolithiasis, and hydronephrosis. EXAM: CT ABDOMEN AND PELVIS WITH CONTRAST TECHNIQUE: Multidetector CT imaging of the abdomen and pelvis was performed using the standard protocol following bolus administration of intravenous contrast. CONTRAST:  156mL ISOVUE-300 IOPAMIDOL (ISOVUE-300) INJECTION 61% COMPARISON:  Ultrasound 12/18/2015, CT 12/15/2014 FINDINGS: Lower chest: Unremarkable appearance of the soft tissues of the chest wall. Heart size within normal limits.  No pericardial fluid/thickening. No lower mediastinal adenopathy. Unremarkable appearance of the distal esophagus. Small hiatal hernia Dependent atelectasis with trace bilateral pleural effusions. Abdomen/pelvis: Unremarkable appearance of liver and spleen. Unremarkable bilateral adrenal glands. Unremarkable pancreas. Unremarkable gallbladder. Distended stomach, duodenum, and jejunum, with multiple air-fluid levels and dilated bowel loops. There is decompression of distal ileum in  the right lower quadrant, with a transition on image 53. Mild to moderate stool burden.  Colonic diverticula. Within the low abdomen/pelvis there are ill-defined inflammatory changes with contracted appearance of the local anatomy including sigmoid colon, dome of the urinary bladder, adjacent small bowel loops, mesenteric, and the abdominal wall musculature (image 83). Small amount of free-fluid, with obscuration of the fat planes. Trace fluid throughout the abdomen.  No free air identified. Configuration the right kidney collecting system unchanged from the comparison CT, with extrarenal pelvis and mild dilation the proximal ureter. Unremarkable course of the  right ureter with no stone identified. Similar appearance of left-sided nephrolithiasis with unchanged appearance of the dilated collecting system. The stones at the left ureteropelvic junction have increased in size. Unchanged position of distal left ureteral stone, just above the ureterovesical junction. This stone is slightly increased in size now measuring 11 mm. Significant inflammatory changes associated with the left collecting system, with thickening and enhancement of the proximal left ureter and ureterovesical junction. Symmetric perfusion of the kidneys maintained. Urinary bladder is decompressed with urinary catheter in situ. Gas within the dome of the urinary bladder, nonspecific. The dome of the bladder is indistinguishable from inflammatory changes in the low abdomen/ pelvis, described above. Multiple retroperitoneal lymph nodes. Fibroid changes of the uterus. Scattered calcifications of the abdominal aorta. No dissection or aneurysm. Atherosclerotic changes at the origin of the mesenteric vessels, with no significant stenosis or occlusion identified. Musculoskeletal: Degenerative changes of the spine. No significant bony canal narrowing. No displaced fracture. IMPRESSION: Evidence of developing bowel obstruction or ileus, with transition point in the distal ileum. Etiology may be secondary to adhesion of the right lower quadrant, or could potentially be reactive given the significant inflammatory changes involving the urinary system and the urinary bladder. Ill-defined inflammatory/ infectious changes of the low abdomen/ pelvis, where several viscera are involved. Inflammatory changes involve the dome of the urinary bladder, distal small bowel loops, and the sigmoid colon, with a source involving any of these. Favored etiology is secondary to chronic urinary tract infection given the imaging appearance over time and left-sided obstructive nephrolithiasis, however, diverticulitis not excluded.  Persistent at least partially obstructive left-sided nephrolithiasis, with stones at the ureteropelvic junction and in the distal ureter just above the left ureterovesical junction. Inflammatory changes at the hilum of the left kidney have worsened since the comparison CT, compatible with pyelonephritis/urinary tract infection. Associated lymph nodes of the retroperitoneum. Aortic atherosclerosis. Trace bilateral pleural effusions. Signed, Dulcy Fanny. Earleen Newport, DO [facility] Electronically Signed   By: Corrie Mckusick D.O.   On: 12/22/2015 07:45   US Renal  Result Date: 12/18/2015 CLINICAL DATA:  Altered mental status.  UTI. EXAM: RENAL / URINARY TRACT ULTRASOUND COMPLETE COMPARISON:  07/21/2015 FINDINGS: Right Kidney: Length: 14 cm. Mild right hydronephrosis, similar to 07/21/2015 sonogram and abdominal CT 12/15/2014. Echogenic cortex Left Kidney: Length: 15.7 cm. Confluent calculi in the renal pelvis with the largest discrete stone measuring 22 mm. There is chronic hydronephrosis. Urinary collecting system debris. A non shadowing rounded echogenic structure in the collecting system superiorly is likely also debris. Lower pole cyst, both hilar and cortical with bilobed appearance, up to 73 mm in diameter and stable/simple. Bladder: History of Foley catheter.  The bladder is collapsed. IMPRESSION: 1. Collecting system debris on the left as seen with infection or hemorrhage. Asymmetric enlargement of the left kidney, a sign seen with pyelonephritis. No indication of abscess. 2. Chronic mild bilateral hydronephrosis.  3. Extensive left nephrolithiasis. 4. Chronically enlarged and echogenic kidneys, favored secondary to patient's diabetes. Electronically Signed   By: Monte Fantasia M.D.   On: 12/18/2015 17:14   Ir US Guide Vasc Access Left  Result Date: 12/25/2015 INDICATION: Sepsis, urinary tract infection and obstructive uropathy on the left with central  pelvic calculi and additional distal left ureteral calculus. Left-sided percutaneous nephrostomy has been requested for decompression. IV access was lost and there has been inability to re-establish IV access for conscious sedation. EXAM: 1. ULTRASOUND-GUIDED VASCULAR ACCESS OF THE LEFT BRACHIAL VEIN WITH IV ACCESS PLACEMENT 2. LEFT PERCUTANEOUS NEPHROSTOMY TUBE PLACEMENT WITH ULTRASOUND GUIDANCE IR NEPHROSTOMY PLACEMENT LEFT; IR RADIOLOGY PERIPHERAL GUIDED IV START; IR ULTRASOUND GUIDANCE VASC ACCESS LEFT COMPARISON:  CT of the abdomen and pelvis on 12/22/2015 MEDICATIONS: 2 g IV Ancef. As antibiotic prophylaxis, Ancef was ordered pre-procedure and administered intravenously within one hour of incision. ANESTHESIA/SEDATION: Fentanyl 75 mcg IV; Versed 1.5 mg IV Moderate Sedation Time:  20 minutes. The patient was continuously monitored during the procedure by the interventional radiology nurse under my direct supervision. CONTRAST:  20 mL Isovue-300 - administered into the collecting system(s) FLUOROSCOPY TIME:  Fluoroscopy Time: 2 minutes and 12 seconds. COMPLICATIONS: None immediate. PROCEDURE: Informed written consent was obtained from the patient after a thorough discussion of the procedural risks, benefits and alternatives. All questions were addressed. Maximal Sterile Barrier Technique was utilized including caps, mask, sterile gowns, sterile gloves, sterile drape, hand hygiene and skin antiseptic. A timeout was performed prior to the initiation of the procedure. Ultrasound was used to confirm patency of the left brachial vein. Under direct ultrasound guidance, a 21 gauge needle was advanced into the vein. After securing guidewire access, a 4 French micropuncture dilator was advanced into the vein. This is secured for use for IV access. The patient was placed in a prone position and sonographic localization of the left kidney performed. Skin was prepped with chlorhexidine. Local anesthesia was provided with 1%  lidocaine. Under ultrasound guidance, a 21 gauge needle was advanced into the left kidney. Contrast injection was performed under fluoroscopy. Additional access of the left kidney was then performed under ultrasound guidance with a second 21 gauge needle into the upper pole collecting system. Contrast injection was performed under fluoroscopy. A guidewire was advanced into the collecting system. A transitional dilator was placed. A guidewire was then advanced into the collecting system. Percutaneous access was dilated and a 10 French percutaneous nephrostomy tube advanced. The catheter was formed and injected with contrast material to confirm position. The catheter was then flushed and connected to a gravity drainage bag. The tube was secured at the skin with a Prolene retention suture and StatLock device. FINDINGS: Initial puncture of a dumbbell-shaped left renal cyst was performed. Contrast injection confirms that this is a large cyst with no communication with the collecting system. Two adjacent large calculi are identified in the renal pelvis. After separate upper pole access, opacification of the collecting system was accomplished. This demonstrates 2 calculi in the renal pelvis occupying most of the space of the renal pelvis. There is flow of contrast past the calculi and into the ureter. Degree of hydronephrosis is relatively mild. The renal cysts cause splaying of the interpolar and lower pole collecting system. Via upper pole access, a nephrostomy tube was placed and advanced into the renal pelvis. IMPRESSION: 1. Ultrasound-guided venous access of the left brachial vein performed. 2. Left-sided percutaneous nephrostomy tube placement, as above. Initial access of a large  dumbbell-shaped renal cyst was performed with contrast injection showing no communication with the collecting system. After upper pole access, a 10 French nephrostomy tube was placed and advanced to the renal pelvis. This was connected to a  gravity drainage bag. Electronically Signed   By: Aletta Edouard M.D.   On: 12/25/2015 16:54   Dg Chest Port 1 View  Result Date: 12/30/2015 CLINICAL DATA:  55 year old female PICC line placement. Initial encounter. EXAM: PORTABLE CHEST 1 VIEW COMPARISON:  12/18/2015 and earlier. FINDINGS: Portable AP semi upright view at at 1101 hours. Left upper extremity approach PICC line has been placed and the tip courses to the lower SVC level, however there is a loop in the right superior mediastinum, likely within the innominate vein confluence (arrow). Improved lung volumes. No pneumothorax. Allowing for portable technique, the lungs are clear. Normal cardiac size and mediastinal contours. Visualized tracheal air column is within normal limits. Partially visible left upper quadrant catheter. IMPRESSION: 1. Left upper extremity approach PICC line is looped in the superior right mediastinum likely at the innominate vein confluence. Recommend repositioning. The tip currently is at the SVC level. 2.  No acute cardiopulmonary abnormality. Electronically Signed   By: Genevie Ann M.D.   On: 12/30/2015 11:26   Dg Chest Port 1 View  Result Date: 12/18/2015 CLINICAL DATA:  Weakness, altered mental status EXAM: PORTABLE CHEST 1 VIEW COMPARISON:  Chest x-rays dated 11/11/2015 and 12/14/2014. FINDINGS: Heart size is normal. Overall cardiomediastinal silhouette is normal in size and configuration. Study is hypoinspiratory with crowding of the perihilar bronchovascular markings. Given the low lung volumes, lungs appear clear. Questionable mild atelectasis or small pleural effusion at the left costophrenic angle. IMPRESSION: Questionable mild atelectasis or small pleural effusion at the left costophrenic angle. Lungs otherwise clear. No evidence of pneumonia. No large pleural effusion. Heart size normal. Electronically Signed   By: Franki Cabot M.D.   On: 12/18/2015 11:17   Dg Abd 2 Views  Result Date: 12/20/2015 CLINICAL DATA:   Abdominal distention EXAM: ABDOMEN - 2 VIEW COMPARISON:  11/11/2015 abdominal radiograph FINDINGS: Diffuse gaseous distention of the small and large bowel. No evidence of pneumatosis or pneumoperitoneum. Clustered 28 x 11 mm and 22 x 11 mm left renal stones appear stable. Clustered 19 x 6 mm and 4 mm left deep pelvic calcifications, cannot exclude distal left ureteral stones. IMPRESSION: 1. Diffuse gaseous distention of the small and large bowel, most suggestive of diffuse adynamic ileus. Distal small bowel obstruction cannot be excluded. If there is clinical concern for small bowel obstruction, consider correlation with CT abdomen/pelvis with oral and IV contrast. 2. Left renal stones.  Possible distal left ureteral stones. Electronically Signed   By: Ilona Sorrel M.D.   On: 12/20/2015 11:48   Ir Radiology Peripheral Guided Iv Start  Result Date: 12/25/2015 INDICATION: Sepsis, urinary tract infection and obstructive uropathy on the left with central pelvic calculi and additional distal left ureteral calculus. Left-sided percutaneous nephrostomy has been requested for decompression. IV access was lost and there has been inability to re-establish IV access for conscious sedation. EXAM: 1. ULTRASOUND-GUIDED VASCULAR ACCESS OF THE LEFT BRACHIAL VEIN WITH IV ACCESS PLACEMENT 2. LEFT PERCUTANEOUS NEPHROSTOMY TUBE PLACEMENT WITH ULTRASOUND GUIDANCE IR NEPHROSTOMY PLACEMENT LEFT; IR RADIOLOGY PERIPHERAL GUIDED IV START; IR ULTRASOUND GUIDANCE VASC ACCESS LEFT COMPARISON:  CT of the abdomen and pelvis on 12/22/2015 MEDICATIONS: 2 g IV Ancef. As antibiotic prophylaxis, Ancef was ordered pre-procedure and administered intravenously within one hour of incision. ANESTHESIA/SEDATION:  Fentanyl 75 mcg IV; Versed 1.5 mg IV Moderate Sedation Time:  20 minutes. The patient was continuously monitored during the procedure by the interventional radiology nurse under my direct supervision. CONTRAST:  20 mL Isovue-300 - administered  into the collecting system(s) FLUOROSCOPY TIME:  Fluoroscopy Time: 2 minutes and 12 seconds. COMPLICATIONS: None immediate. PROCEDURE: Informed written consent was obtained from the patient after a thorough discussion of the procedural risks, benefits and alternatives. All questions were addressed. Maximal Sterile Barrier Technique was utilized including caps, mask, sterile gowns, sterile gloves, sterile drape, hand hygiene and skin antiseptic. A timeout was performed prior to the initiation of the procedure. Ultrasound was used to confirm patency of the left brachial vein. Under direct ultrasound guidance, a 21 gauge needle was advanced into the vein. After securing guidewire access, a 4 French micropuncture dilator was advanced into the vein. This is secured for use for IV access. The patient was placed in a prone position and sonographic localization of the left kidney performed. Skin was prepped with chlorhexidine. Local anesthesia was provided with 1% lidocaine. Under ultrasound guidance, a 21 gauge needle was advanced into the left kidney. Contrast injection was performed under fluoroscopy. Additional access of the left kidney was then performed under ultrasound guidance with a second 21 gauge needle into the upper pole collecting system. Contrast injection was performed under fluoroscopy. A guidewire was advanced into the collecting system. A transitional dilator was placed. A guidewire was then advanced into the collecting system. Percutaneous access was dilated and a 10 French percutaneous nephrostomy tube advanced. The catheter was formed and injected with contrast material to confirm position. The catheter was then flushed and connected to a gravity drainage bag. The tube was secured at the skin with a Prolene retention suture and StatLock device. FINDINGS: Initial puncture of a dumbbell-shaped left renal cyst was performed. Contrast injection confirms that this is a large cyst with no communication with  the collecting system. Two adjacent large calculi are identified in the renal pelvis. After separate upper pole access, opacification of the collecting system was accomplished. This demonstrates 2 calculi in the renal pelvis occupying most of the space of the renal pelvis. There is flow of contrast past the calculi and into the ureter. Degree of hydronephrosis is relatively mild. The renal cysts cause splaying of the interpolar and lower pole collecting system. Via upper pole access, a nephrostomy tube was placed and advanced into the renal pelvis. IMPRESSION: 1. Ultrasound-guided venous access of the left brachial vein performed. 2. Left-sided percutaneous nephrostomy tube placement, as above. Initial access of a large dumbbell-shaped renal cyst was performed with contrast injection showing no communication with the collecting system. After upper pole access, a 10 French nephrostomy tube was placed and advanced to the renal pelvis. This was connected to a gravity drainage bag. Electronically Signed   By: Aletta Edouard M.D.   On: 12/25/2015 16:54   Ir Nephrostomy Placement Left  Result Date: 12/25/2015 INDICATION: Sepsis, urinary tract infection and obstructive uropathy on the left with central pelvic calculi and additional distal left ureteral calculus. Left-sided percutaneous nephrostomy has been requested for decompression. IV access was lost and there has been inability to re-establish IV access for conscious sedation. EXAM: 1. ULTRASOUND-GUIDED VASCULAR ACCESS OF THE LEFT BRACHIAL VEIN WITH IV ACCESS PLACEMENT 2. LEFT PERCUTANEOUS NEPHROSTOMY TUBE PLACEMENT WITH ULTRASOUND GUIDANCE IR NEPHROSTOMY PLACEMENT LEFT; IR RADIOLOGY PERIPHERAL GUIDED IV START; IR ULTRASOUND GUIDANCE VASC ACCESS LEFT COMPARISON:  CT of the abdomen and pelvis  on 12/22/2015 MEDICATIONS: 2 g IV Ancef. As antibiotic prophylaxis, Ancef was ordered pre-procedure and administered intravenously within one hour of incision.  ANESTHESIA/SEDATION: Fentanyl 75 mcg IV; Versed 1.5 mg IV Moderate Sedation Time:  20 minutes. The patient was continuously monitored during the procedure by the interventional radiology nurse under my direct supervision. CONTRAST:  20 mL Isovue-300 - administered into the collecting system(s) FLUOROSCOPY TIME:  Fluoroscopy Time: 2 minutes and 12 seconds. COMPLICATIONS: None immediate. PROCEDURE: Informed written consent was obtained from the patient after a thorough discussion of the procedural risks, benefits and alternatives. All questions were addressed. Maximal Sterile Barrier Technique was utilized including caps, mask, sterile gowns, sterile gloves, sterile drape, hand hygiene and skin antiseptic. A timeout was performed prior to the initiation of the procedure. Ultrasound was used to confirm patency of the left brachial vein. Under direct ultrasound guidance, a 21 gauge needle was advanced into the vein. After securing guidewire access, a 4 French micropuncture dilator was advanced into the vein. This is secured for use for IV access. The patient was placed in a prone position and sonographic localization of the left kidney performed. Skin was prepped with chlorhexidine. Local anesthesia was provided with 1% lidocaine. Under ultrasound guidance, a 21 gauge needle was advanced into the left kidney. Contrast injection was performed under fluoroscopy. Additional access of the left kidney was then performed under ultrasound guidance with a second 21 gauge needle into the upper pole collecting system. Contrast injection was performed under fluoroscopy. A guidewire was advanced into the collecting system. A transitional dilator was placed. A guidewire was then advanced into the collecting system. Percutaneous access was dilated and a 10 French percutaneous nephrostomy tube advanced. The catheter was formed and injected with contrast material to confirm position. The catheter was then flushed and connected to a  gravity drainage bag. The tube was secured at the skin with a Prolene retention suture and StatLock device. FINDINGS: Initial puncture of a dumbbell-shaped left renal cyst was performed. Contrast injection confirms that this is a large cyst with no communication with the collecting system. Two adjacent large calculi are identified in the renal pelvis. After separate upper pole access, opacification of the collecting system was accomplished. This demonstrates 2 calculi in the renal pelvis occupying most of the space of the renal pelvis. There is flow of contrast past the calculi and into the ureter. Degree of hydronephrosis is relatively mild. The renal cysts cause splaying of the interpolar and lower pole collecting system. Via upper pole access, a nephrostomy tube was placed and advanced into the renal pelvis. IMPRESSION: 1. Ultrasound-guided venous access of the left brachial vein performed. 2. Left-sided percutaneous nephrostomy tube placement, as above. Initial access of a large dumbbell-shaped renal cyst was performed with contrast injection showing no communication with the collecting system. After upper pole access, a 10 French nephrostomy tube was placed and advanced to the renal pelvis. This was connected to a gravity drainage bag. Electronically Signed   By: Aletta Edouard M.D.   On: 12/25/2015 16:54    Assessment/Plan:   Type 2 DM CBG's stable. Continue on glipizide 5 mg Tablet. Monitor Hgb A1C   Anemia  Chronic. CBC in 1-2 weeks with PCP   Manutrition Continue on Protein Supplement   GERD Asymptomatic. Continue on famotidine 20 mg Tablet.   MS  Progressive. Continue on Baclofen. [facility] to assist with ADL's.   Hyperlipidemia  Continue on Atorvastatin 10 mg Tablet   Indwelling Foley Catheter Draining adequate  yellow urine. Status post hospital admission for UTI. [place] RN for foley Management.   Neuropathic Pain  Continue on Gabapentin 100 mg Capsule.   Nephrostomy tube  Status  post Nephrolithiasis. Has completed course of antibiotics.   Skin Dermatitis  Bilateral Lower extremities. Apply Aquaphor twice daily x 4 weeks.   [facility]aud[facility] Mayhew PCP to arrange for outpatient podiatry.     Patient is being discharged with the following home health services:    PT/OT to continue with ROM, Exercise and muscle strengthening.     [facility] RN for foley catheter and left Nephrostomy management.   [facility] to assist with ADL's.  Patient is being discharged with the following durable medical equipment: No DME required   Patient has been advised to f/u with their PCP in 1-2 weeks to bring them up to date on their rehab stay.  Social services at facility was responsible for arranging this appointment.  Pt was provided with a 30 day supply of prescriptions for medications and refills must be obtained from their PCP.  For controlled substances, a more limited supply may be provided adequate until PCP appointment only.  Future labs/tests needed:  CBC, BMP in 1-2 weeks with PCP

## 2016-02-08 ENCOUNTER — Encounter (HOSPITAL_COMMUNITY): Payer: Self-pay | Admitting: Emergency Medicine

## 2016-02-08 ENCOUNTER — Emergency Department (HOSPITAL_COMMUNITY)
Admission: EM | Admit: 2016-02-08 | Discharge: 2016-02-08 | Disposition: A | Discharge status: Home / Self Care | Payer: Medicare Other | Attending: Emergency Medicine | Admitting: Emergency Medicine

## 2016-02-08 DIAGNOSIS — T83512A Infection and inflammatory reaction due to nephrostomy catheter, initial encounter: Secondary | ICD-10-CM | POA: Diagnosis not present

## 2016-02-08 DIAGNOSIS — Z7984 Long term (current) use of oral hypoglycemic drugs: Secondary | ICD-10-CM | POA: Diagnosis not present

## 2016-02-08 DIAGNOSIS — Y829 Unspecified medical devices associated with adverse incidents: Secondary | ICD-10-CM | POA: Diagnosis not present

## 2016-02-08 DIAGNOSIS — Z87891 Personal history of nicotine dependence: Secondary | ICD-10-CM | POA: Diagnosis not present

## 2016-02-08 DIAGNOSIS — N132 Hydronephrosis with renal and ureteral calculous obstruction: Secondary | ICD-10-CM

## 2016-02-08 DIAGNOSIS — Z79899 Other long term (current) drug therapy: Secondary | ICD-10-CM | POA: Diagnosis not present

## 2016-02-08 DIAGNOSIS — E1122 Type 2 diabetes mellitus with diabetic chronic kidney disease: Secondary | ICD-10-CM | POA: Insufficient documentation

## 2016-02-08 DIAGNOSIS — N390 Urinary tract infection, site not specified: Secondary | ICD-10-CM

## 2016-02-08 DIAGNOSIS — N183 Chronic kidney disease, stage 3 (moderate): Secondary | ICD-10-CM | POA: Insufficient documentation

## 2016-02-08 DIAGNOSIS — N12 Tubulo-interstitial nephritis, not specified as acute or chronic: Secondary | ICD-10-CM | POA: Diagnosis present

## 2016-02-08 LAB — BASIC METABOLIC PANEL
ANION GAP: 5 (ref 5–15)
BUN: 15 mg/dL (ref 6–20)
CHLORIDE: 109 mmol/L (ref 101–111)
CO2: 27 mmol/L (ref 22–32)
Calcium: 10.4 mg/dL — ABNORMAL HIGH (ref 8.9–10.3)
Creatinine, Ser: 0.51 mg/dL (ref 0.44–1.00)
GFR calc Af Amer: >60 mL/min (ref >60)
GLUCOSE: 96 mg/dL (ref 65–99)
POTASSIUM: 3.5 mmol/L (ref 3.5–5.1)
SODIUM: 141 mmol/L (ref 135–145)

## 2016-02-08 LAB — CBC WITH DIFFERENTIAL/PLATELET
BASOS ABS: 0.1 10*3/uL (ref 0.0–0.1)
Basophils Relative: 1 %
Eosinophils Absolute: 0.6 10*3/uL (ref 0.0–0.7)
Eosinophils Relative: 5 %
HEMATOCRIT: 25.5 % — AB (ref 36.0–46.0)
Hemoglobin: 8.4 g/dL — ABNORMAL LOW (ref 12.0–15.0)
LYMPHS ABS: 4.3 10*3/uL — AB (ref 0.7–4.0)
LYMPHS PCT: 32 %
MCH: 29.5 pg (ref 26.0–34.0)
MCHC: 32.9 g/dL (ref 30.0–36.0)
MCV: 89.5 fL (ref 78.0–100.0)
Monocytes Absolute: 1 10*3/uL (ref 0.1–1.0)
Monocytes Relative: 8 %
NEUTROS ABS: 7.3 10*3/uL (ref 1.7–7.7)
Neutrophils Relative %: 54 %
Platelets: 412 10*3/uL — ABNORMAL HIGH (ref 150–400)
RBC: 2.85 MIL/uL — AB (ref 3.87–5.11)
RDW: 17.7 % — ABNORMAL HIGH (ref 11.5–15.5)
WBC: 13.3 10*3/uL — AB (ref 4.0–10.5)

## 2016-02-08 LAB — URINALYSIS, ROUTINE W REFLEX MICROSCOPIC
Bilirubin Urine: NEGATIVE
GLUCOSE, UA: NEGATIVE mg/dL
Glucose, UA: NEGATIVE mg/dL
Ketones, ur: NEGATIVE mg/dL
Ketones, ur: NEGATIVE mg/dL
Nitrite: POSITIVE — AB
Nitrite: POSITIVE — AB
PH: 7 (ref 5.0–8.0)
Protein, ur: 100 mg/dL — AB
Protein, ur: 100 mg/dL — AB
SPECIFIC GRAVITY, URINE: 1.017 (ref 1.005–1.030)
Specific Gravity, Urine: 1.018 (ref 1.005–1.030)
pH: 6.5 (ref 5.0–8.0)

## 2016-02-08 LAB — URINE MICROSCOPIC-ADD ON: Squamous Epithelial / LPF: NONE SEEN

## 2016-02-08 MED ORDER — SULFAMETHOXAZOLE-TRIMETHOPRIM 800-160 MG PO TABS: 1.0000 | ORAL_TABLET | Freq: Two times a day (BID) | ORAL | 0 refills | Status: AC

## 2016-02-08 NOTE — ED Provider Notes (Signed)
[facility] DEPT Provider Note   CSN: [id number] Arrival date & time: 02/08/16  1253     History   Chief Complaint Chief Complaint  Patient presents with  . blood in nephrostomy tube    HPI [name] is a 55 y.o. female.  [name] is a 55 y.o. Female who presents to the emergency department complaining of blood in her left nephrostomy tube starting yesterday. She reports yesterday she noticed some blood in her nephrostomy tube became worse today. Her home health aide and encouraged her to come to the emergency department today. She denies any pain or fevers. Patient also has a Foley catheter in place. Patient had a left nephrostomy tube placed 12/25/2015 for left pyelonephritis. Patient denies fevers, back pain, abdominal pain, nausea, vomiting, diarrhea.    The history is provided by the patient and medical records. No language interpreter was used.    Past Medical History:  Diagnosis Date  . Acute renal failure superimposed on stage 3 chronic kidney disease ([place]) 07/21/2015  . Diabetes mellitus without complication ([place])   . DVT (deep venous thrombosis) ([place])   . Malnutrition of moderate degree 07/21/2015  . Multiple sclerosis diagnosed 2002-not on Therapy any longer 06/26/2012  . Stage 4 decubitus ulcer ([place]) 07/05/2015  . Stroke ([place])   . UTI (lower urinary tract infection)     Patient Active Problem List   Diagnosis Date Noted  . Neuropathic pain 01/11/2016  . Attention to nephrostomy ([place]) 01/11/2016  . Acute renal failure ([place])   . Partial small bowel obstruction ([place]) 12/24/2015  . Bacteremia due to coagulase-negative Staphylococcus 12/22/2015  . Left nephrolithiasis 12/22/2015  . Gram-negative bacteremia ([place]) 12/22/2015  . Palliative care encounter   . SIRS (systemic inflammatory response syndrome) ([place]) 12/18/2015  . Decubitus ulcer   . Controlled diabetes mellitus type 2 with complications ([place])   . Neurogenic bladder 11/11/2015  . Physical  deconditioning 11/11/2015  . Chronic indwelling Foley catheter 09/30/2015  . Hypokalemia 09/04/2015  . Dyslipidemia associated with type 2 diabetes mellitus ([place]) 08/28/2015  . GERD without esophagitis 08/28/2015  . Acute renal failure superimposed on stage 3 chronic kidney disease ([place]) 07/21/2015  . Leukocytosis 07/21/2015  . Anemia of chronic disease 07/21/2015  . Hyperkalemia 07/21/2015  . Catheter-associated urinary tract infection ([place]) 07/21/2015  . Malnutrition of moderate degree 07/21/2015  . DVT, lower extremity ([place]) 12/28/2014  . Multiple sclerosis diagnosed 2002-not on Therapy any longer 06/26/2012    Past Surgical History:  Procedure Laterality Date  . ESOPHAGOGASTRODUODENOSCOPY Left 01/02/2015   Procedure: ESOPHAGOGASTRODUODENOSCOPY (EGD);  Surgeon: Arta Silence, MD;  Location: [facility];  Service: Endoscopy;  Laterality: Left;  . HERNIA MESH REMOVAL    . TUBAL LIGATION    . tubes tided      OB History    No data available       Home Medications    Prior to Admission medications   Medication Sig Start Date End Date Taking? Authorizing Provider  albuterol (PROVENTIL) (2.5 MG/3ML) 0.083% nebulizer solution Take 3 mLs (2.5 mg total) by nebulization every 6 (six) hours as needed for wheezing or shortness of breath. 12/30/15  Yes Robbie Lis, MD  atorvastatin (LIPITOR) 10 MG tablet Take 10 mg by mouth daily.   Yes Historical Provider, MD  baclofen (LIORESAL) 10 MG tablet Take 1 tablet (10 mg total) by mouth 3 (three) times daily. 11/18/15  Yes Cherene Altes, MD  famotidine (PEPCID) 20 MG tablet Take 1 tablet (  20 mg total) by mouth 2 (two) times daily. 11/18/15  Yes Cherene Altes, MD  feeding supplement, ENSURE ENLIVE, (ENSURE ENLIVE) LIQD Take 237 mLs by mouth 2 (two) times daily between meals. 12/30/15  Yes Robbie Lis, MD  furosemide (LASIX) 20 MG tablet Take 40 mg by mouth 2 (two) times daily. 01/29/16  Yes Historical Provider, MD  gabapentin (NEURONTIN)  100 MG capsule Take 1 capsule (100 mg total) by mouth at bedtime. 11/18/15  Yes Cherene Altes, MD  glipiZIDE (GLUCOTROL) 5 MG tablet Take 5 mg by mouth 2 (two) times daily. 11/30/15  Yes Historical Provider, MD  lisinopril (PRINIVIL,ZESTRIL) 2.5 MG tablet Take 2.5 mg by mouth daily. 11/30/15  Yes Historical Provider, MD  lovastatin (MEVACOR) 20 MG tablet Take 20 mg by mouth daily. 01/29/16  Yes Historical Provider, MD  metFORMIN (GLUCOPHAGE) 500 MG tablet Take 500 mg by mouth 2 (two) times daily. 01/29/16  Yes Historical Provider, MD  Multiple Vitamin (MULTIVITAMIN WITH MINERALS) TABS tablet Take 1 tablet by mouth daily. 07/09/15  Yes Florencia Reasons, MD  Omega 3 1000 MG CAPS Take 1,000 mg by mouth daily.   Yes Historical Provider, MD  rivaroxaban (XARELTO) 20 MG TABS tablet Take 1 tablet (20 mg total) by mouth daily with supper. Reported on 08/12/2015 11/18/15  Yes Cherene Altes, MD  senna-docusate (SENOKOT-S) 8.6-50 MG tablet Take 1 tablet by mouth 2 (two) times daily. Patient taking differently: Take 1 tablet by mouth 2 (two) times daily as needed for mild constipation.  12/30/15  Yes Robbie Lis, MD  solifenacin (VESICARE) 5 MG tablet Take 1 tablet (5 mg total) by mouth daily. 11/18/15  Yes Cherene Altes, MD  Amino Acids-Protein Hydrolys (FEEDING SUPPLEMENT, PRO-STAT SUGAR FREE 64,) LIQD Take 30 mLs by mouth 2 (two) times daily. Patient not taking: Reported on 02/08/2016 11/18/15   Cherene Altes, MD  polyethylene glycol [facility] / Floria Raveling) packet Take 17 g by mouth 2 (two) times daily. Patient not taking: Reported on 02/08/2016 11/18/15   Cherene Altes, MD  sulfamethoxazole-trimethoprim (BACTRIM DS,SEPTRA DS) 800-160 MG tablet Take 1 tablet by mouth 2 (two) times daily. 02/08/16 02/15/16  Waynetta Pean, PA-C    Family History Family History  Problem Relation Age of Onset  . Diabetes Mother   . Alzheimer's disease Mother   . Diabetes Father   . Diabetes Sister   . Scoliosis Sister     Social  History Social History  Substance Use Topics  . Smoking status: Former Smoker    Packs/day: 1.00    Years: 20.00    Types: Cigarettes    Quit date: 11/19/2014  . Smokeless tobacco: Never Used  . Alcohol use 3.0 oz/week    5 Glasses of wine per week     Allergies   Review of patient's allergies indicates no known allergies.   Review of Systems Review of Systems  Constitutional: Negative for chills and fever.  HENT: Negative for congestion and sore throat.   Eyes: Negative for visual disturbance.  Respiratory: Negative for cough and shortness of breath.   Cardiovascular: Negative for chest pain.  Gastrointestinal: Negative for abdominal pain, diarrhea, nausea and vomiting.  Genitourinary: Positive for hematuria. Negative for decreased urine volume and dysuria.       Foley cath and nephrostomy   Musculoskeletal: Negative for back pain and neck pain.  Skin: Negative for rash.  Neurological: Negative for headaches.     Physical Exam Updated Vital Signs BP  145/70 (BP Location: Right Arm)   Pulse 72   Temp 97.9 F (36.6 C) (Oral)   Resp 18   SpO2 100%   Physical Exam  Constitutional: She is oriented to person, place, and time. She appears well-developed and well-nourished. No distress.  HENT:  Head: Normocephalic and atraumatic.  Mouth/Throat: Oropharynx is clear and moist.  Eyes: Conjunctivae are normal. Pupils are equal, round, and reactive to light. Right eye exhibits no discharge. Left eye exhibits no discharge.  Neck: Neck supple.  Cardiovascular: Normal rate, regular rhythm, normal heart sounds and intact distal pulses.  Exam reveals no gallop and no friction rub.   No murmur heard. Pulmonary/Chest: Effort normal and breath sounds normal. No respiratory distress. She has no wheezes. She has no rales.  Abdominal: Soft. Bowel sounds are normal. There is no tenderness. There is no guarding.  Abdomen is soft and nontender to palpation. Left nephrostomy tube is in place  with reddish drainage. No tenderness to the site. No overlying erythema or edema.  Genitourinary:  Genitourinary Comments: Foley cath in place.  Some erythema noted to her bilateral buttocks, but no broken skin.   Musculoskeletal: She exhibits no edema.  Lymphadenopathy:    She has no cervical adenopathy.  Neurological: She is alert and oriented to person, place, and time. Coordination normal.  Skin: Skin is warm and dry. Capillary refill takes less than 2 seconds. She is not diaphoretic. No erythema. No pallor.  Psychiatric: She has a normal mood and affect. Her behavior is normal.  Nursing note and vitals reviewed.    ED Treatments / Results  Labs (all labs ordered are listed, but only abnormal results are displayed) Labs Reviewed  URINALYSIS, ROUTINE W REFLEX MICROSCOPIC (NOT AT [facility]) - Abnormal; Notable for the following:       Result Value   APPearance TURBID (*)    Hgb urine dipstick LARGE (*)    Protein, ur 100 (*)    Nitrite POSITIVE (*)    Leukocytes, UA LARGE (*)    All other components within normal limits  BASIC METABOLIC PANEL - Abnormal; Notable for the following:    Calcium 10.4 (*)    All other components within normal limits  CBC WITH DIFFERENTIAL/PLATELET - Abnormal; Notable for the following:    WBC 13.3 (*)    RBC 2.85 (*)    Hemoglobin 8.4 (*)    HCT 25.5 (*)    RDW 17.7 (*)    Platelets 412 (*)    Lymphs Abs 4.3 (*)    All other components within normal limits  URINALYSIS, ROUTINE W REFLEX MICROSCOPIC (NOT AT [facility]) - Abnormal; Notable for the following:    Color, Urine RED (*)    APPearance TURBID (*)    Hgb urine dipstick LARGE (*)    Bilirubin Urine SMALL (*)    Protein, ur 100 (*)    Nitrite POSITIVE (*)    Leukocytes, UA LARGE (*)    All other components within normal limits  URINE MICROSCOPIC-ADD ON - Abnormal; Notable for the following:    Squamous Epithelial / LPF 0-5 (*)    Bacteria, UA MANY (*)    All other components within normal  limits  URINE MICROSCOPIC-ADD ON - Abnormal; Notable for the following:    Bacteria, UA MANY (*)    All other components within normal limits  URINE CULTURE    EKG  EKG Interpretation None       Radiology No results found.  Procedures Procedures (  including critical care time)  Medications Ordered in ED Medications - No data to display   Initial Impression / Assessment and Plan / ED Course  I have reviewed the triage vital signs and the nursing notes.  Pertinent labs & imaging results that were available during my care of the patient were reviewed by me and considered in my medical decision making (see chart for details).  Clinical Course   Patient presented to the emergency department complaining of some blood in her left nephrostomy tubing since yesterday. No abdominal pain, back pain or fevers. She reports she's been still making urine normally. She has a Foley catheter in place as well. Patient recently treated for pyelonephritis. Most recent urine culture grew out providencia stuartii and was susceptible to bactrim.  On exam patient is afebrile nontoxic appearing. Her nephrostomy tube is in place and does have some pinkish drainage in the back. No tenderness to palpation to her back or surrounding the nephrostomy site. Foley catheter is also in place. Abdomen is soft and nontender. Patient does have some evidence of skin erythema to her bilateral buttocks. Concerning for cervical decubitus ulcer. I encouraged her to be turned regularly by her nursing aide at home.  BMP is unremarkable. Preserve kidney function. CBC reveals a mild leukocytosis with a white count of 13,000. Urine from her nephrostomy and catheter is nitrite positive with large leukocytes. Urine sent for culture. Foley catheter was replaced in the emergency department. Will start on Bactrim as her most recent culture was susceptible to Bactrim. I encouraged her to call and make a follow-up appointment with urology.  I advised the patient to follow-up with their primary care provider this week. I advised the patient to return to the emergency department with new or worsening symptoms or new concerns. The patient verbalized understanding and agreement with plan.    This patient was discussed with Dr. Wilson Singer who agrees with assessment and plan.    Final Clinical Impressions(s) / ED Diagnoses   Final diagnoses:  Urinary tract infection associated with nephrostomy catheter, initial encounter    New Prescriptions New Prescriptions   SULFAMETHOXAZOLE-TRIMETHOPRIM (BACTRIM DS,SEPTRA DS) 800-160 MG TABLET    Take 1 tablet by mouth 2 (two) times daily.     Waynetta Pean, PA-C 02/08/16 1924    Virgel Manifold, MD 02/18/16 [phone]

## 2016-02-08 NOTE — Progress Notes (Signed)
Patient ID: [name], female   DOB: [birth year], 55 y.o.   MRN: [id number] Patient had an order placed for a routine PCN exchange but because she is currently in the ED it showed up right now.  Her drain has a serosanguineous appearance.  She states it began to appear this way yesterday.  Her drain site is clean, dry, and intact with no bleeding or evidence of infection.  We will defer exchange until patient has been evaluated for her current complaints or until she is an outpatient as intended, unless this becomes a more acute problem.  [place] E 3:31 PM 02/08/2016

## 2016-02-08 NOTE — ED Notes (Signed)
I attempted twice and was unsuccessful 

## 2016-02-08 NOTE — Discharge Instructions (Signed)
Please follow up with urology as above.

## 2016-02-08 NOTE — ED Notes (Signed)
Bed: [facility] Expected date:  Expected time:  Means of arrival:  Comments: 

## 2016-02-08 NOTE — ED Triage Notes (Signed)
Per EMS, Pt from home c/o blood in her nephrostomy tube.  Pt has had the tube for about a month now. Pt denies any complaints, no pain, just blood in bag. A&Ox4. Pt unstable on her feet. Pt bed bound for the most part.

## 2016-02-09 ENCOUNTER — Other Ambulatory Visit: Payer: Self-pay | Admitting: Urology

## 2016-02-09 DIAGNOSIS — N132 Hydronephrosis with renal and ureteral calculous obstruction: Secondary | ICD-10-CM

## 2016-02-11 LAB — URINE CULTURE: Culture: >=100000 — AB

## 2016-02-12 ENCOUNTER — Telehealth (HOSPITAL_BASED_OUTPATIENT_CLINIC_OR_DEPARTMENT_OTHER): Payer: Self-pay | Admitting: Emergency Medicine

## 2016-02-12 NOTE — Progress Notes (Signed)
ED Antimicrobial Stewardship Positive Culture Follow Up   [name] is an 55 y.o. female who presented to [facility] on 02/08/2016 with a chief complaint of  Chief Complaint  Patient presents with  . blood in nephrostomy tube    Recent Results (from the past 720 hour(s))  Urine culture     Status: Abnormal   Collection Time: 02/08/16  3:42 PM  Result Value Ref Range Status   Specimen Description URINE, CATHETERIZED  Final   Special Requests NONE  Final   Culture >=100,000 COLONIES/mL PSEUDOMONAS AERUGINOSA (A)  Final   Report Status 02/11/2016 FINAL  Final   Organism ID, Bacteria PSEUDOMONAS AERUGINOSA (A)  Final      Susceptibility   Pseudomonas aeruginosa - MIC*    CEFTAZIDIME 4 SENSITIVE Sensitive     CIPROFLOXACIN <=0.25 SENSITIVE Sensitive     GENTAMICIN <=1 SENSITIVE Sensitive     IMIPENEM 2 SENSITIVE Sensitive     PIP/TAZO 32 SENSITIVE Sensitive     CEFEPIME 4 SENSITIVE Sensitive     * >=100,000 COLONIES/mL PSEUDOMONAS AERUGINOSA    [x]  Treated with Bactrim, organism resistant to prescribed antimicrobial []  Patient discharged originally without antimicrobial agent and treatment is now indicated  New antibiotic prescription:  Cipro 500mg  PO BID x 7 days Patient should stop Bactrim  ED Provider: Abigail Butts, PA-C   Norva Riffle 02/12/2016, 9:46 AM Infectious Diseases Pharmacist Phone# [phone]

## 2016-02-16 ENCOUNTER — Non-Acute Institutional Stay (SKILLED_NURSING_FACILITY): Payer: Medicare Other | Admitting: Internal Medicine

## 2016-02-16 ENCOUNTER — Encounter: Payer: Self-pay | Admitting: Internal Medicine

## 2016-02-16 DIAGNOSIS — N319 Neuromuscular dysfunction of bladder, unspecified: Secondary | ICD-10-CM | POA: Diagnosis not present

## 2016-02-16 DIAGNOSIS — N390 Urinary tract infection, site not specified: Secondary | ICD-10-CM

## 2016-02-16 DIAGNOSIS — R5381 Other malaise: Secondary | ICD-10-CM | POA: Diagnosis not present

## 2016-02-16 DIAGNOSIS — I82402 Acute embolism and thrombosis of unspecified deep veins of left lower extremity: Secondary | ICD-10-CM

## 2016-02-16 DIAGNOSIS — E785 Hyperlipidemia, unspecified: Secondary | ICD-10-CM | POA: Diagnosis not present

## 2016-02-16 DIAGNOSIS — D638 Anemia in other chronic diseases classified elsewhere: Secondary | ICD-10-CM | POA: Diagnosis not present

## 2016-02-16 DIAGNOSIS — I10 Essential (primary) hypertension: Secondary | ICD-10-CM

## 2016-02-16 DIAGNOSIS — D72829 Elevated white blood cell count, unspecified: Secondary | ICD-10-CM | POA: Diagnosis not present

## 2016-02-16 DIAGNOSIS — K219 Gastro-esophageal reflux disease without esophagitis: Secondary | ICD-10-CM

## 2016-02-16 DIAGNOSIS — G35 Multiple sclerosis: Secondary | ICD-10-CM

## 2016-02-16 DIAGNOSIS — E1140 Type 2 diabetes mellitus with diabetic neuropathy, unspecified: Secondary | ICD-10-CM | POA: Diagnosis not present

## 2016-02-16 DIAGNOSIS — I890 Lymphedema, not elsewhere classified: Secondary | ICD-10-CM

## 2016-02-16 DIAGNOSIS — K5909 Other constipation: Secondary | ICD-10-CM

## 2016-02-16 DIAGNOSIS — E46 Unspecified protein-calorie malnutrition: Secondary | ICD-10-CM | POA: Diagnosis not present

## 2016-02-16 DIAGNOSIS — K5900 Constipation, unspecified: Secondary | ICD-10-CM

## 2016-02-16 DIAGNOSIS — N200 Calculus of kidney: Secondary | ICD-10-CM | POA: Diagnosis not present

## 2016-02-16 NOTE — Progress Notes (Signed)
LOCATION: [name]  PCP: No primary care provider on file.   Code Status: Full Code  Goals of care: Advanced Directive information Advanced Directives 02/08/2016  Does patient have an advance directive? No  Does patient want to make changes to advanced directive? -  Copy of advanced directive(s) in chart? -  Would patient like information on creating an advanced directive? No - patient declined information  Pre-existing out of facility DNR order (yellow form or pink MOST form) -       Extended Emergency Contact Information Primary Emergency Contact: [facility] Address: [place], [place] [address] Johnnette Litter of [place] Phone: [phone] Mobile Phone: [phone] Relation: Relative Secondary Emergency Contact: Hughes,Michael Address: [address]          [place], [place] [address] Johnnette Litter of [place] Phone: [phone] Mobile Phone: [phone] Relation: Relative   No Known Allergies  Chief Complaint  Patient presents with  . New Admit To SNF    New Admission     HPI:  Patient is a 55 y.o. female seen today post ED visit on 02/08/16 for blood in nephrostomy tube. Her urine analysis was suggestive of infection. She had her foley catheter changes and was started on antibiotics. She is s/p nephrostomy tube on 12/25/15 for left sided obstructive nephrolithiasis. She was discharged home from ED and came to this SNF on 02/12/16 for rehabilitation and possible long term care. She is seen in her room today. She has medical history of multiple sclerosis, decubitus ulcer, ckd, CVA among others.    Review of Systems:  Constitutional: Negative for fever, chills, diaphoresis.  HENT: Negative for headache, congestion, nasal discharge, difficulty swallowing.   Eyes: Negative for blurred vision, double vision and discharge.  Respiratory: Negative for cough, shortness of breath and wheezing.   Cardiovascular: Negative for  chest pain, palpitations.  Gastrointestinal: Negative for heartburn, nausea, vomiting, abdominal pain. Last bowel movement was today. Genitourinary: Negative for flank pain. has foley catheter in place.  Musculoskeletal: Negative for fall in the facility.  Skin: Negative for itching, rash.  Neurological: Negative for dizziness. Psychiatric/Behavioral: Negative for depression   Past Medical History:  Diagnosis Date  . Acute renal failure superimposed on stage 3 chronic kidney disease ([place]) 07/21/2015  . Diabetes mellitus without complication ([place])   . DVT (deep venous thrombosis) ([place])   . Malnutrition of moderate degree 07/21/2015  . Multiple sclerosis diagnosed 2002-not on Therapy any longer 06/26/2012  . Stage 4 decubitus ulcer ([place]) 07/05/2015  . Stroke ([place])   . UTI (lower urinary tract infection)    Past Surgical History:  Procedure Laterality Date  . ESOPHAGOGASTRODUODENOSCOPY Left 01/02/2015   Procedure: ESOPHAGOGASTRODUODENOSCOPY (EGD);  Surgeon: Arta Silence, MD;  Location: [facility];  Service: Endoscopy;  Laterality: Left;  . HERNIA MESH REMOVAL    . TUBAL LIGATION    . tubes tided     Social History:   reports that she quit smoking about 14 months ago. Her smoking use included Cigarettes. She has a 20.00 pack-year smoking history. She has never used smokeless tobacco. She reports that she drinks about 3.0 oz of alcohol per week . She reports that she does not use drugs.  Family History  Problem Relation Age of Onset  . Diabetes Mother   . Alzheimer's disease Mother   . Diabetes Father   . Diabetes Sister   . Scoliosis Sister  Medications:   Medication List       Accurate as of 02/16/16  3:27 PM. Always use your most recent med list.          albuterol (2.5 MG/3ML) 0.083% nebulizer solution Commonly known as:  PROVENTIL Take 3 mLs (2.5 mg total) by nebulization every 6 (six) hours as needed for wheezing or shortness of breath.   atorvastatin 10 MG  tablet Commonly known as:  LIPITOR Take 10 mg by mouth daily.   baclofen 10 MG tablet Commonly known as:  LIORESAL Take 1 tablet (10 mg total) by mouth 3 (three) times daily.   famotidine 20 MG tablet Commonly known as:  PEPCID Take 1 tablet (20 mg total) by mouth 2 (two) times daily.   feeding supplement (ENSURE ENLIVE) Liqd Take 237 mLs by mouth 2 (two) times daily between meals.   feeding supplement (PRO-STAT SUGAR FREE 64) Liqd Take 30 mLs by mouth 2 (two) times daily.   furosemide 20 MG tablet Commonly known as:  LASIX Take 40 mg by mouth 2 (two) times daily.   gabapentin 100 MG capsule Commonly known as:  NEURONTIN Take 1 capsule (100 mg total) by mouth at bedtime.   glipiZIDE 5 MG tablet Commonly known as:  GLUCOTROL Take 5 mg by mouth 2 (two) times daily.   lisinopril 2.5 MG tablet Commonly known as:  PRINIVIL,ZESTRIL Take 2.5 mg by mouth daily.   metFORMIN 500 MG tablet Commonly known as:  GLUCOPHAGE Take 500 mg by mouth 2 (two) times daily.   multivitamin with minerals Tabs tablet Take 1 tablet by mouth daily.   Omega 3 1000 MG Caps Take 1,000 mg by mouth daily.   polyethylene glycol packet Commonly known as:  MIRALAX / GLYCOLAX Take 17 g by mouth 2 (two) times daily.   rivaroxaban 20 MG Tabs tablet Commonly known as:  XARELTO Take 1 tablet (20 mg total) by mouth daily with supper. Reported on 08/12/2015   senna-docusate 8.6-50 MG tablet Commonly known as:  Senokot-S Take 1 tablet by mouth 2 (two) times daily.   solifenacin 5 MG tablet Commonly known as:  VESICARE Take 1 tablet (5 mg total) by mouth daily.   sulfamethoxazole-trimethoprim 800-160 MG tablet Commonly known as:  BACTRIM DS,SEPTRA DS Take 1 tablet by mouth 2 (two) times daily. Stop date 02/20/16       Immunizations: Immunization History  Administered Date(s) Administered  . Influenza Split 06/26/2012  . Influenza-Unspecified 04/02/2015  . PPD Test 01/04/2015, 07/09/2015,  07/23/2015, 09/02/2015, 12/31/2015  . Pneumococcal-Unspecified 03/13/2014     Physical Exam:  Vitals:   02/16/16 1521  BP: 128/70  Pulse: 67  Resp: 20  Temp: 97.4 F (36.3 C)  TempSrc: Oral  SpO2: 97%  Weight: 140 lb (63.5 kg)  Height: 5\' 3"  (1.6 m)   Body mass index is 24.8 kg/m.  General- adult female, well built, in no acute distress Head- normocephalic, atraumatic Nose- no nasal discharge Throat- moist mucus membrane  Eyes- PERRLA, EOMI, no pallor, no icterus, no discharge Neck- no cervical lymphadenopathy Cardiovascular- normal s1,s2, no murmur Respiratory- bilateral clear to auscultation, no wheeze, no rhonchi, no crackles, no use of accessory muscles Abdomen- bowel sounds present, soft, non tender, foley catheter present, left nephrostomy tube in place Musculoskeletal- generalized weakness, strength 3/5 in her lower extremities, RUE weakness 3-4/5 present Neurological- alert and oriented to person, place and time Skin- warm and dry, chronic skin changes to her lower legs noted Psychiatry- normal mood and affect  Labs reviewed: Basic Metabolic Panel:  Recent Labs  11/11/15 1447  11/12/15 2334  11/15/15 [id number] 11/16/15 0334 11/17/15 0709  12/25/15 0617 12/29/15 1355 01/06/16 02/08/16 1724  NA  --   < >  --   < > 143 141 140  < > 137 137 139 141  K  --   < >  --   < > 3.3* 4.2 3.7  < > 4.2 3.9 3.7 3.5  CL  --   < >  --   < > 110 112* 110  < > 107 104  --  109  CO2  --   < >  --   < > 25 24 25   < > 26 28  --  27  GLUCOSE  --   < >  --   < > 121* 108* 93  < > 130* 195*  --  96  BUN  --   < >  --   < > 16 15 13   < > <5* 12 19 15   CREATININE 1.27*  < >  --   < > 0.61 0.61 0.54  < > 0.52 0.61 0.5 0.51  CALCIUM  --   < >  --   < > 9.6 9.2 9.5  < > 9.6 10.2  --  10.4*  MG 1.7  --   --   < > 1.9 1.7 1.8  --   --   --   --   --   PHOS 4.3  --  1.8*  --   --   --  3.2  --   --   --   --   --   < > = values in this interval not displayed. Liver Function  Tests:  Recent Labs  11/17/15 0709 12/18/15 1035 12/19/15 0744 01/06/16  AST 9* 15 12* 12*  ALT 15 8* 7* 17  ALKPHOS 62 40 50 67  BILITOT 0.4 0.7 0.5  --   PROT 6.0* 6.6 6.6  --   ALBUMIN 2.0* 2.2* 2.3*  --    No results for input(s): LIPASE, AMYLASE in the last 8760 hours.  Recent Labs  07/20/15 1712  AMMONIA 21   CBC:  Recent Labs  12/28/15 0414 12/29/15 1355 12/30/15 0510 12/30/15 2003 01/06/16 02/08/16 1724  WBC 17.6* 16.2* 16.7*  --  104.0 13.3*  NEUTROABS 12.1* 11.4*  --   --   --  7.3  HGB 7.1* 7.0* 6.6* 9.4* 9.7* 8.4*  HCT 22.9* 23.0* 21.3* 28.8* 31* 25.5*  MCV 85.8 85.5 85.2  --   --  89.5  PLT 724* 809* 818*  --  610* 412*   Cardiac Enzymes:  Recent Labs  07/21/15 0310 07/21/15 0900 11/11/15 1627  TROPONINI <0.03 <0.03 <0.03   BNP: Invalid input(s): POCBNP CBG:  Recent Labs  12/30/15 1159 12/30/15 1643 12/30/15 2102  GLUCAP 219* 156* 163*      Assessment/Plan  Physical deconditioning Will have her work with physical therapy and occupational therapy team to help with gait training and muscle strengthening exercises.fall precautions. Skin care. Encourage to be out of bed.   UTI Continue and complete course of septra bid on 02/20/16. Hydration and foley catheter care to be maintained  Leukocytosis Currently on antibiotic for UTI, monitor wbc and temp curve  Neurogenic bladder Continue vesicare and foley catheter with foley care  Left nephrolithiasis S/p left nephrostomy tube, to follow up with renal team  Anemia of chronic disease Monitor h&h  Protein calorie malnutrition Continue protein supplement and get RD consult. Continue multivitamin  DM type 2 with neuropathic pain Lab Results  Component Value Date   HGBA1C 6.0 (H) 12/18/2015   Monitor cbg, continue glipizide 5 mg bid and metformin 500 mg bid. Check a1c. Continue neurontin 100 mg qhs  Chronic lymphedema Stable, continue furosemide 40 mg bid and  monitor  HTN Stable bp, monitor, continue lisinopril 2.5 mg daily, check bmp  Left leg DVT Continue xarelto and monitor  Hyperlipidemia Lipid Panel     Component Value Date/Time   CHOL 143 07/13/2015   TRIG 71 07/13/2015   HDL 41 07/13/2015   LDLCALC 88 07/13/2015  check lipid panel. Continue atorvastatin 10 mg daily and monitor  Chronic constipation Continue miralax bid and senna s bid for now  Multiple sclerosis Continue baclofen 10 mg tid, continue supportive care  gerd Stable, continue pepcid 20 mg bid    Goals of care: short term rehabilitation, possible long term care   Labs/tests ordered: cbc, cmp 02/17/16  Family/ staff Communication: reviewed care plan with patient and nursing supervisor    Blanchie Serve, MD Internal Medicine [place], [place] [address] Cell Phone (Monday-Friday 8 am - 5 pm): [phone] On Call: [phone] and follow prompts after 5 pm and on weekends Office Phone: [phone] Office Fax: [phone]

## 2016-02-18 LAB — CBC AND DIFFERENTIAL
HEMATOCRIT: 33 % — AB (ref 36–46)
Hemoglobin: 10.1 g/dL — AB (ref 12.0–16.0)
PLATELETS: 422 10*3/uL — AB (ref 150–399)
WBC: 9.2 10^3/mL

## 2016-02-18 LAB — HEPATIC FUNCTION PANEL
ALK PHOS: 53 U/L (ref 25–125)
ALT: 15 U/L (ref 7–35)
AST: 15 U/L (ref 13–35)
BILIRUBIN, TOTAL: 0.3 mg/dL

## 2016-02-18 LAB — HEMOGLOBIN A1C: HEMOGLOBIN A1C: 5.1

## 2016-02-18 LAB — BASIC METABOLIC PANEL
BUN: 21 mg/dL (ref 4–21)
CREATININE: 0.8 mg/dL (ref 0.5–1.1)
GLUCOSE: 104 mg/dL
Potassium: 5 mmol/L (ref 3.4–5.3)
Sodium: 136 mmol/L — AB (ref 137–147)

## 2016-02-18 LAB — LIPID PANEL
Cholesterol: 169 mg/dL (ref 0–200)
HDL: 52 mg/dL (ref 35–70)
LDL CALC: 95 mg/dL
TRIGLYCERIDES: 109 mg/dL (ref 40–160)

## 2016-02-22 ENCOUNTER — Other Ambulatory Visit: Payer: Self-pay | Admitting: Urology

## 2016-02-23 ENCOUNTER — Ambulatory Visit (HOSPITAL_COMMUNITY)
Admission: RE | Admit: 2016-02-23 | Discharge: 2016-02-23 | Disposition: A | Discharge status: Home / Self Care | Payer: Medicare Other | Source: Ambulatory Visit | Attending: Urology | Admitting: Urology

## 2016-02-23 ENCOUNTER — Encounter (HOSPITAL_COMMUNITY): Payer: Self-pay | Admitting: Interventional Radiology

## 2016-02-23 ENCOUNTER — Other Ambulatory Visit (HOSPITAL_COMMUNITY): Payer: Self-pay | Admitting: Interventional Radiology

## 2016-02-23 DIAGNOSIS — N132 Hydronephrosis with renal and ureteral calculous obstruction: Secondary | ICD-10-CM

## 2016-02-23 DIAGNOSIS — Z436 Encounter for attention to other artificial openings of urinary tract: Secondary | ICD-10-CM | POA: Insufficient documentation

## 2016-02-23 DIAGNOSIS — N131 Hydronephrosis with ureteral stricture, not elsewhere classified: Secondary | ICD-10-CM

## 2016-02-23 HISTORY — PX: IR GENERIC HISTORICAL: IMG1180011

## 2016-02-23 MED ORDER — IOPAMIDOL (ISOVUE-300) INJECTION 61%
50.0000 mL | Freq: Once | INTRAVENOUS | Status: AC | PRN
  Administered 2016-02-23: 8 mL

## 2016-02-23 MED ORDER — LIDOCAINE HCL 1 % IJ SOLN
INTRAMUSCULAR | Status: AC
  Filled 2016-02-23: qty 20

## 2016-02-23 NOTE — Procedures (Signed)
Successful left sided PCN exchange.   No immediate complications.   Ronny Bacon, MD Pager #: [phone]

## 2016-02-25 ENCOUNTER — Encounter (HOSPITAL_COMMUNITY): Payer: Self-pay | Admitting: *Deleted

## 2016-02-29 ENCOUNTER — Encounter (HOSPITAL_COMMUNITY): Payer: Self-pay | Admitting: *Deleted

## 2016-02-29 NOTE — Progress Notes (Signed)
Preop instructions for: [name]                      Date of Birth: 04-05-1961                   Date of Procedure:  03/14/2016    Doctor: Dr [name] Time to arrive at [facility]:  5:00 am  Report to: [name] Short Stay-enter hospital through [place]; Follow over head signs for [facility]; take to 3rd floor; when doors open you are at Short Stay; doors to elevators will not open prior to 5:00 am   Any procedure time changes, MD office will notify you!   Do not eat or drink past midnight the night before your procedure.(To include any tube feedings-must be discontinued) Reminder:Follow bowel prep instructions per MD office!   Take these morning medications only with sips of water.(or give through gastrostomy or feeding tube). Famotidine; May use albuterol Nebulizer Treatment if needed; Baclofen   Note: No Insulin or Diabetic meds should be given or taken the morning of the procedure!  WILL NEED TO CLARIFY WITH MD IN REGARDS TO [name] PRIOR TO [place] contact: [name] Roach                 Phone: [phone]                Health Care POA: [name] (cousin) [phone]  Transportation contact phone#:[name] [phone]  Please send day of procedure:current med list and meds last taken that day, confirm nothing by mouth status from what time, Patient Demographic info( to include DNR status, problem list, allergies)    contact name/phone#:  [name] [phone]                          and Fax #: [phone]  Bring Insurance card and picture ID Leave all jewelry and other valuables at Roach where living( no metal or rings to be worn) No contact lens Women-no make-up, no lotions,perfumes,powders   FOLLOW CHLORHEXIDINE SHOWER INSTRUCTIONS NIGHT PRIOR AND MORNING OF SURGERY      Sent from :[facility]                   [place]            Fax:[phone]  Sent by :RN Patricia Dimmer RN BSN

## 2016-02-29 NOTE — Progress Notes (Signed)
Spoke with [name]; pts cousin; Ms [name] aware of pts arrival time for scheduled surgery date of 03/14/2016. [name] verified that she is pts POA but pt does sign own paperwork and does not have cognitive issues. Ms [name] verified she would be present morning of surgery.  Surgical instructions faxed to [facility]; spoke with Jessica/director of medical records; verified surgical instructions had been received with no further questions.

## 2016-03-08 ENCOUNTER — Emergency Department (HOSPITAL_COMMUNITY): Payer: Medicare Other

## 2016-03-08 ENCOUNTER — Inpatient Hospital Stay (HOSPITAL_COMMUNITY)
Admission: EM | Admit: 2016-03-08 | Discharge: 2016-03-20 | DRG: 853 | Disposition: A | Discharge status: Skilled Nursing Facility | Payer: Medicare Other | Attending: Internal Medicine | Admitting: Internal Medicine

## 2016-03-08 ENCOUNTER — Inpatient Hospital Stay (HOSPITAL_COMMUNITY): Payer: Medicare Other

## 2016-03-08 ENCOUNTER — Non-Acute Institutional Stay (SKILLED_NURSING_FACILITY): Payer: Medicare Other | Admitting: Family

## 2016-03-08 ENCOUNTER — Encounter (HOSPITAL_COMMUNITY): Payer: Self-pay

## 2016-03-08 DIAGNOSIS — E1140 Type 2 diabetes mellitus with diabetic neuropathy, unspecified: Secondary | ICD-10-CM | POA: Diagnosis present

## 2016-03-08 DIAGNOSIS — N732 Unspecified parametritis and pelvic cellulitis: Secondary | ICD-10-CM | POA: Diagnosis not present

## 2016-03-08 DIAGNOSIS — N739 Female pelvic inflammatory disease, unspecified: Secondary | ICD-10-CM

## 2016-03-08 DIAGNOSIS — R339 Retention of urine, unspecified: Secondary | ICD-10-CM

## 2016-03-08 DIAGNOSIS — E876 Hypokalemia: Secondary | ICD-10-CM | POA: Diagnosis present

## 2016-03-08 DIAGNOSIS — Z936 Other artificial openings of urinary tract status: Secondary | ICD-10-CM

## 2016-03-08 DIAGNOSIS — N12 Tubulo-interstitial nephritis, not specified as acute or chronic: Secondary | ICD-10-CM

## 2016-03-08 DIAGNOSIS — Z978 Presence of other specified devices: Secondary | ICD-10-CM

## 2016-03-08 DIAGNOSIS — N3090 Cystitis, unspecified without hematuria: Secondary | ICD-10-CM | POA: Diagnosis present

## 2016-03-08 DIAGNOSIS — N735 Female pelvic peritonitis, unspecified: Secondary | ICD-10-CM | POA: Diagnosis present

## 2016-03-08 DIAGNOSIS — Z8673 Personal history of transient ischemic attack (TIA), and cerebral infarction without residual deficits: Secondary | ICD-10-CM

## 2016-03-08 DIAGNOSIS — E872 Acidosis: Secondary | ICD-10-CM | POA: Diagnosis present

## 2016-03-08 DIAGNOSIS — K651 Peritoneal abscess: Secondary | ICD-10-CM | POA: Diagnosis not present

## 2016-03-08 DIAGNOSIS — E8770 Fluid overload, unspecified: Secondary | ICD-10-CM | POA: Diagnosis present

## 2016-03-08 DIAGNOSIS — R5383 Other fatigue: Secondary | ICD-10-CM

## 2016-03-08 DIAGNOSIS — R000 Tachycardia, unspecified: Secondary | ICD-10-CM

## 2016-03-08 DIAGNOSIS — E46 Unspecified protein-calorie malnutrition: Secondary | ICD-10-CM | POA: Diagnosis not present

## 2016-03-08 DIAGNOSIS — E1121 Type 2 diabetes mellitus with diabetic nephropathy: Secondary | ICD-10-CM | POA: Diagnosis not present

## 2016-03-08 DIAGNOSIS — E1122 Type 2 diabetes mellitus with diabetic chronic kidney disease: Secondary | ICD-10-CM | POA: Diagnosis present

## 2016-03-08 DIAGNOSIS — D649 Anemia, unspecified: Secondary | ICD-10-CM | POA: Diagnosis present

## 2016-03-08 DIAGNOSIS — N179 Acute kidney failure, unspecified: Secondary | ICD-10-CM | POA: Diagnosis not present

## 2016-03-08 DIAGNOSIS — I950 Idiopathic hypotension: Secondary | ICD-10-CM | POA: Diagnosis not present

## 2016-03-08 DIAGNOSIS — Z7984 Long term (current) use of oral hypoglycemic drugs: Secondary | ICD-10-CM

## 2016-03-08 DIAGNOSIS — R1084 Generalized abdominal pain: Secondary | ICD-10-CM | POA: Diagnosis not present

## 2016-03-08 DIAGNOSIS — L8990 Pressure ulcer of unspecified site, unspecified stage: Secondary | ICD-10-CM | POA: Diagnosis present

## 2016-03-08 DIAGNOSIS — G9341 Metabolic encephalopathy: Secondary | ICD-10-CM | POA: Diagnosis present

## 2016-03-08 DIAGNOSIS — I82402 Acute embolism and thrombosis of unspecified deep veins of left lower extremity: Secondary | ICD-10-CM | POA: Diagnosis not present

## 2016-03-08 DIAGNOSIS — E869 Volume depletion, unspecified: Secondary | ICD-10-CM | POA: Diagnosis present

## 2016-03-08 DIAGNOSIS — G9340 Encephalopathy, unspecified: Secondary | ICD-10-CM

## 2016-03-08 DIAGNOSIS — Z87891 Personal history of nicotine dependence: Secondary | ICD-10-CM | POA: Diagnosis not present

## 2016-03-08 DIAGNOSIS — R5381 Other malaise: Secondary | ICD-10-CM | POA: Diagnosis not present

## 2016-03-08 DIAGNOSIS — Z79899 Other long term (current) drug therapy: Secondary | ICD-10-CM

## 2016-03-08 DIAGNOSIS — N183 Chronic kidney disease, stage 3 (moderate): Secondary | ICD-10-CM | POA: Diagnosis present

## 2016-03-08 DIAGNOSIS — B9689 Other specified bacterial agents as the cause of diseases classified elsewhere: Secondary | ICD-10-CM | POA: Diagnosis not present

## 2016-03-08 DIAGNOSIS — L0291 Cutaneous abscess, unspecified: Secondary | ICD-10-CM

## 2016-03-08 DIAGNOSIS — R6521 Severe sepsis with septic shock: Secondary | ICD-10-CM | POA: Diagnosis present

## 2016-03-08 DIAGNOSIS — N319 Neuromuscular dysfunction of bladder, unspecified: Secondary | ICD-10-CM | POA: Diagnosis present

## 2016-03-08 DIAGNOSIS — Z960 Presence of urogenital implants: Secondary | ICD-10-CM

## 2016-03-08 DIAGNOSIS — J811 Chronic pulmonary edema: Secondary | ICD-10-CM | POA: Diagnosis not present

## 2016-03-08 DIAGNOSIS — N322 Vesical fistula, not elsewhere classified: Secondary | ICD-10-CM | POA: Diagnosis not present

## 2016-03-08 DIAGNOSIS — I10 Essential (primary) hypertension: Secondary | ICD-10-CM | POA: Diagnosis not present

## 2016-03-08 DIAGNOSIS — I129 Hypertensive chronic kidney disease with stage 1 through stage 4 chronic kidney disease, or unspecified chronic kidney disease: Secondary | ICD-10-CM | POA: Diagnosis present

## 2016-03-08 DIAGNOSIS — G35 Multiple sclerosis: Secondary | ICD-10-CM | POA: Diagnosis present

## 2016-03-08 DIAGNOSIS — N200 Calculus of kidney: Secondary | ICD-10-CM | POA: Diagnosis present

## 2016-03-08 DIAGNOSIS — L89152 Pressure ulcer of sacral region, stage 2: Secondary | ICD-10-CM | POA: Diagnosis present

## 2016-03-08 DIAGNOSIS — I959 Hypotension, unspecified: Secondary | ICD-10-CM | POA: Insufficient documentation

## 2016-03-08 DIAGNOSIS — Z452 Encounter for adjustment and management of vascular access device: Secondary | ICD-10-CM | POA: Insufficient documentation

## 2016-03-08 DIAGNOSIS — IMO0002 Reserved for concepts with insufficient information to code with codable children: Secondary | ICD-10-CM

## 2016-03-08 DIAGNOSIS — R05 Cough: Secondary | ICD-10-CM

## 2016-03-08 DIAGNOSIS — Z7901 Long term (current) use of anticoagulants: Secondary | ICD-10-CM

## 2016-03-08 DIAGNOSIS — R059 Cough, unspecified: Secondary | ICD-10-CM

## 2016-03-08 DIAGNOSIS — Z820 Family history of epilepsy and other diseases of the nervous system: Secondary | ICD-10-CM

## 2016-03-08 DIAGNOSIS — Z86718 Personal history of other venous thrombosis and embolism: Secondary | ICD-10-CM

## 2016-03-08 DIAGNOSIS — D638 Anemia in other chronic diseases classified elsewhere: Secondary | ICD-10-CM | POA: Diagnosis present

## 2016-03-08 DIAGNOSIS — N189 Chronic kidney disease, unspecified: Secondary | ICD-10-CM

## 2016-03-08 DIAGNOSIS — A419 Sepsis, unspecified organism: Principal | ICD-10-CM | POA: Diagnosis present

## 2016-03-08 DIAGNOSIS — R509 Fever, unspecified: Secondary | ICD-10-CM

## 2016-03-08 DIAGNOSIS — J810 Acute pulmonary edema: Secondary | ICD-10-CM | POA: Diagnosis not present

## 2016-03-08 DIAGNOSIS — Z7401 Bed confinement status: Secondary | ICD-10-CM

## 2016-03-08 DIAGNOSIS — K219 Gastro-esophageal reflux disease without esophagitis: Secondary | ICD-10-CM | POA: Diagnosis present

## 2016-03-08 DIAGNOSIS — I82409 Acute embolism and thrombosis of unspecified deep veins of unspecified lower extremity: Secondary | ICD-10-CM | POA: Diagnosis present

## 2016-03-08 DIAGNOSIS — Z833 Family history of diabetes mellitus: Secondary | ICD-10-CM

## 2016-03-08 DIAGNOSIS — T17908A Unspecified foreign body in respiratory tract, part unspecified causing other injury, initial encounter: Secondary | ICD-10-CM

## 2016-03-08 LAB — COMPREHENSIVE METABOLIC PANEL
ALK PHOS: 40 U/L (ref 38–126)
ALT: 13 U/L — AB (ref 14–54)
AST: 21 U/L (ref 15–41)
Albumin: 3.2 g/dL — ABNORMAL LOW (ref 3.5–5.0)
Anion gap: 12 (ref 5–15)
BILIRUBIN TOTAL: 0.6 mg/dL (ref 0.3–1.2)
BUN: 59 mg/dL — ABNORMAL HIGH (ref 6–20)
CALCIUM: 10.3 mg/dL (ref 8.9–10.3)
CO2: 22 mmol/L (ref 22–32)
CREATININE: 2.57 mg/dL — AB (ref 0.44–1.00)
Chloride: 105 mmol/L (ref 101–111)
GFR, EST AFRICAN AMERICAN: 23 mL/min — AB (ref >60)
GFR, EST NON AFRICAN AMERICAN: 20 mL/min — AB (ref >60)
Glucose, Bld: 212 mg/dL — ABNORMAL HIGH (ref 65–99)
Potassium: 5.5 mmol/L — ABNORMAL HIGH (ref 3.5–5.1)
Sodium: 139 mmol/L (ref 135–145)
Total Protein: 7.7 g/dL (ref 6.5–8.1)

## 2016-03-08 LAB — URINALYSIS, ROUTINE W REFLEX MICROSCOPIC
Bilirubin Urine: NEGATIVE
Glucose, UA: NEGATIVE mg/dL
Ketones, ur: NEGATIVE mg/dL
Nitrite: NEGATIVE
Protein, ur: NEGATIVE mg/dL
Specific Gravity, Urine: 1.01 (ref 1.005–1.030)
pH: 7 (ref 5.0–8.0)

## 2016-03-08 LAB — CBC
HEMATOCRIT: 33.7 % — AB (ref 36.0–46.0)
Hemoglobin: 10.7 g/dL — ABNORMAL LOW (ref 12.0–15.0)
MCH: 29.2 pg (ref 26.0–34.0)
MCHC: 31.8 g/dL (ref 30.0–36.0)
MCV: 91.8 fL (ref 78.0–100.0)
Platelets: 382 10*3/uL (ref 150–400)
RBC: 3.67 MIL/uL — AB (ref 3.87–5.11)
RDW: 15.7 % — AB (ref 11.5–15.5)
WBC: 18.4 10*3/uL — AB (ref 4.0–10.5)

## 2016-03-08 LAB — I-STAT CG4 LACTIC ACID, ED: Lactic Acid, Venous: 5.56 mmol/L (ref 0.5–1.9)

## 2016-03-08 LAB — PROTIME-INR
INR: 1.68
PROTHROMBIN TIME: 20 s — AB (ref 11.4–15.2)

## 2016-03-08 LAB — GLUCOSE, CAPILLARY: Glucose-Capillary: 141 mg/dL — ABNORMAL HIGH (ref 65–99)

## 2016-03-08 LAB — APTT: APTT: 45 s — AB (ref 24–36)

## 2016-03-08 LAB — LACTIC ACID, PLASMA: Lactic Acid, Venous: 1.6 mmol/L (ref 0.5–1.9)

## 2016-03-08 LAB — HEPARIN LEVEL (UNFRACTIONATED): Heparin Unfractionated: 0.72 IU/mL — ABNORMAL HIGH (ref 0.30–0.70)

## 2016-03-08 LAB — TROPONIN I
Troponin I: 0.03 ng/mL (ref <0.03)
Troponin I: <0.03 ng/mL (ref <0.03)

## 2016-03-08 LAB — MRSA PCR SCREENING: MRSA BY PCR: NEGATIVE

## 2016-03-08 LAB — URINE MICROSCOPIC-ADD ON

## 2016-03-08 LAB — PROCALCITONIN: PROCALCITONIN: 61.85 ng/mL

## 2016-03-08 LAB — CBG MONITORING, ED
Glucose-Capillary: 208 mg/dL — ABNORMAL HIGH (ref 65–99)
Glucose-Capillary: 159 mg/dL — ABNORMAL HIGH (ref 65–99)

## 2016-03-08 MED ORDER — SODIUM CHLORIDE 0.9 % IV BOLUS (SEPSIS)
1000.0000 mL | Freq: Once | INTRAVENOUS | Status: AC
  Administered 2016-03-08: 1000 mL via INTRAVENOUS

## 2016-03-08 MED ORDER — SODIUM CHLORIDE 0.9 % IV BOLUS (SEPSIS)
500.0000 mL | Freq: Once | INTRAVENOUS | Status: AC
  Administered 2016-03-08: 500 mL via INTRAVENOUS

## 2016-03-08 MED ORDER — VANCOMYCIN HCL 500 MG IV SOLR: 500.0000 mg | INTRAVENOUS | Status: DC

## 2016-03-08 MED ORDER — PHENYLEPHRINE HCL 10 MG/ML IJ SOLN
0.0000 ug/min | Freq: Once | INTRAMUSCULAR | Status: AC
  Administered 2016-03-08: 20 ug/min via INTRAVENOUS
  Filled 2016-03-08: qty 1

## 2016-03-08 MED ORDER — ATORVASTATIN CALCIUM 10 MG PO TABS
10.0000 mg | ORAL_TABLET | Freq: Every day | ORAL | Status: DC
  Administered 2016-03-09 – 2016-03-19 (×10): 10 mg via ORAL
  Filled 2016-03-08 (×11): qty 1

## 2016-03-08 MED ORDER — PHENYLEPHRINE HCL 10 MG/ML IJ SOLN
30.0000 ug/min | INTRAVENOUS | Status: DC
  Administered 2016-03-08: 70 ug/min via INTRAVENOUS
  Filled 2016-03-08 (×2): qty 1

## 2016-03-08 MED ORDER — VANCOMYCIN HCL IN DEXTROSE 1-5 GM/200ML-% IV SOLN
1000.0000 mg | Freq: Once | INTRAVENOUS | Status: AC
  Administered 2016-03-08: 1000 mg via INTRAVENOUS
  Filled 2016-03-08: qty 200

## 2016-03-08 MED ORDER — FAMOTIDINE 20 MG PO TABS
20.0000 mg | ORAL_TABLET | Freq: Every day | ORAL | Status: DC
  Administered 2016-03-09 – 2016-03-20 (×11): 20 mg via ORAL
  Filled 2016-03-08 (×11): qty 1

## 2016-03-08 MED ORDER — SODIUM CHLORIDE 0.9 % IV SOLN
100.0000 mg | INTRAVENOUS | Status: DC
  Administered 2016-03-09: 100 mg via INTRAVENOUS
  Filled 2016-03-08: qty 100

## 2016-03-08 MED ORDER — PIPERACILLIN-TAZOBACTAM IN DEX 2-0.25 GM/50ML IV SOLN
2.2500 g | Freq: Four times a day (QID) | INTRAVENOUS | Status: DC
  Administered 2016-03-08 – 2016-03-09 (×2): 2.25 g via INTRAVENOUS
  Filled 2016-03-08 (×4): qty 50

## 2016-03-08 MED ORDER — SODIUM CHLORIDE 0.9 % IV SOLN
200.0000 mg | Freq: Once | INTRAVENOUS | Status: AC
  Administered 2016-03-08: 200 mg via INTRAVENOUS
  Filled 2016-03-08: qty 200

## 2016-03-08 MED ORDER — SODIUM CHLORIDE 0.9 % IV SOLN: 250.0000 mL | INTRAVENOUS | Status: DC | PRN

## 2016-03-08 MED ORDER — ORAL CARE MOUTH RINSE
15.0000 mL | Freq: Two times a day (BID) | OROMUCOSAL | Status: DC
  Administered 2016-03-09 – 2016-03-18 (×11): 15 mL via OROMUCOSAL

## 2016-03-08 MED ORDER — BACLOFEN 10 MG PO TABS
10.0000 mg | ORAL_TABLET | Freq: Three times a day (TID) | ORAL | Status: DC
  Administered 2016-03-08 – 2016-03-20 (×33): 10 mg via ORAL
  Filled 2016-03-08 (×33): qty 1

## 2016-03-08 MED ORDER — PIPERACILLIN-TAZOBACTAM 3.375 G IVPB 30 MIN
3.3750 g | Freq: Once | INTRAVENOUS | Status: AC
  Administered 2016-03-08: 3.375 g via INTRAVENOUS
  Filled 2016-03-08: qty 50

## 2016-03-08 MED ORDER — HEPARIN (PORCINE) IN NACL 100-0.45 UNIT/ML-% IJ SOLN
550.0000 [IU]/h | INTRAMUSCULAR | Status: DC
  Administered 2016-03-08: 550 [IU]/h via INTRAVENOUS
  Filled 2016-03-08 (×2): qty 250

## 2016-03-08 MED ORDER — HEPARIN (PORCINE) IN NACL 100-0.45 UNIT/ML-% IJ SOLN
650.0000 [IU]/h | INTRAMUSCULAR | Status: DC
  Administered 2016-03-08: 550 [IU]/h via INTRAVENOUS
  Filled 2016-03-08: qty 250

## 2016-03-08 MED ORDER — ALBUTEROL SULFATE (2.5 MG/3ML) 0.083% IN NEBU
2.5000 mg | INHALATION_SOLUTION | RESPIRATORY_TRACT | Status: DC | PRN
  Administered 2016-03-10 – 2016-03-16 (×2): 2.5 mg via RESPIRATORY_TRACT
  Filled 2016-03-08 (×2): qty 3

## 2016-03-08 MED ORDER — SODIUM CHLORIDE 0.9 % IV SOLN
INTRAVENOUS | Status: DC
  Administered 2016-03-08 – 2016-03-16 (×10): via INTRAVENOUS

## 2016-03-08 MED ORDER — INSULIN ASPART 100 UNIT/ML ~~LOC~~ SOLN
0.0000 [IU] | SUBCUTANEOUS | Status: DC
  Administered 2016-03-08: 1 [IU] via SUBCUTANEOUS
  Administered 2016-03-11 – 2016-03-12 (×4): 2 [IU] via SUBCUTANEOUS
  Administered 2016-03-12: 3 [IU] via SUBCUTANEOUS
  Administered 2016-03-12 – 2016-03-13 (×5): 2 [IU] via SUBCUTANEOUS
  Administered 2016-03-14 – 2016-03-15 (×2): 1 [IU] via SUBCUTANEOUS
  Administered 2016-03-15: 2 [IU] via SUBCUTANEOUS
  Administered 2016-03-15 – 2016-03-17 (×4): 1 [IU] via SUBCUTANEOUS
  Administered 2016-03-17 – 2016-03-18 (×3): 2 [IU] via SUBCUTANEOUS
  Administered 2016-03-18: 1 [IU] via SUBCUTANEOUS
  Filled 2016-03-08: qty 1

## 2016-03-08 MED ORDER — CHLORHEXIDINE GLUCONATE 0.12 % MT SOLN
15.0000 mL | Freq: Two times a day (BID) | OROMUCOSAL | Status: DC
  Administered 2016-03-08 – 2016-03-20 (×21): 15 mL via OROMUCOSAL
  Filled 2016-03-08 (×19): qty 15

## 2016-03-08 NOTE — ED Notes (Signed)
No urine in catheter

## 2016-03-08 NOTE — Progress Notes (Signed)
Patient ID: [name], female   DOB: [birth year], 55 y.o.   MRN: [id number] This patient is 'Code Sepsis' and I have been consulted regarding pelvic abscess drainage. I have reviewed the imaging only. CT AP without IV/Oral contrast reveals an ill-defined inflammatory process in the pelvis. Two abscess collections were measured, however they are very ill-defined and small. These areas have the appearance of phlegmon. CT with IV and oral contrast may provide better detail, I would recommend holding off on CT aspiration, until improved imaging can be performed.

## 2016-03-08 NOTE — Progress Notes (Signed)
Pharmacy Antibiotic Note  [name] is a 55 y.o. female admitted on 03/08/2016 with sepsis.  Pharmacy has been consulted for Vanc/Zosyn dosing.  Plan: Vancomycin 1g x 1 then 500mg  IV every 48 hours.  Goal trough 15-20 mcg/mL. The dose of Zosyn will be adjusted to 2.25g IV q6 after 3.375g x 1 dose based on renal function.    Temp (24hrs), Avg:100.2 F (37.9 C), Min:100 F (37.8 C), Max:100.4 F (38 C)   Recent Labs Lab 03/08/16 1240  CREATININE 2.57*    Estimated Creatinine Clearance: 16.4 mL/min (by C-G formula based on SCr of 2.57 mg/dL (H)).    No Known Allergies   Thank you for allowing pharmacy to be a part of this patient's care.   Adrian Saran, PharmD, BCPS Pager [phone] 03/08/2016 1:36 PM

## 2016-03-08 NOTE — ED Notes (Addendum)
Unable to obtain blood for second set of blood cultures and Lactic Acid.  Several attempts made by nurses in the ER and IV team attempted.

## 2016-03-08 NOTE — Progress Notes (Signed)
ANTICOAGULATION CONSULT NOTE - Initial Consult  Pharmacy Consult for Heparin Indication: hx VTE  No Known Allergies  Patient Measurements:   Heparin Dosing Wt: 43kg  Vital Signs: Temp: 100 F (37.8 C) (09/26 1310) Temp Source: Rectal (09/26 1310) BP: 90/62 (09/26 1700) Pulse Rate: 109 (09/26 1700)  Labs:  Recent Labs  03/08/16 1240 03/08/16 1411  HGB  --  10.7*  HCT  --  33.7*  PLT  --  382  CREATININE 2.57*  --     Estimated Creatinine Clearance: 16.4 mL/min (by C-G formula based on SCr of 2.57 mg/dL (H)).   Medical History: Past Medical History:  Diagnosis Date  . Acute renal failure superimposed on stage 3 chronic kidney disease ([place]) 07/21/2015  . Anemia    chronic   . Diabetes mellitus without complication ([place])   . Diabetic neuropathy ([place])   . DVT (deep venous thrombosis) ([place])   . DVT (deep venous thrombosis) ([place])   . GERD (gastroesophageal reflux disease)   . Hypertension   . Left nephrolithiasis   . Leukocytosis   . Lymph edema    chronic   . Malnutrition of moderate degree 07/21/2015  . Multiple sclerosis diagnosed 2002-not on Therapy any longer 06/26/2012  . Muscle weakness (generalized)   . Neuromuscular disorder ([place])   . Neuromuscular dysfunction of bladder   . Polyneuropathy ([place])   . Presence of indwelling urinary catheter   . Protein calorie malnutrition ([place])   . Stage 4 decubitus ulcer ([place]) 07/05/2015  . Stroke ([place])   . UTI (lower urinary tract infection)     Medications:   Xarelto 20mg  daily PTA- LD 9/25 @ 1700 Infusions:  . sodium chloride    . sodium chloride    . piperacillin-tazobactam (ZOSYN)  IV    . [START ON 03/10/2016] vancomycin      Assessment: 55 yo F on chronic Xarelto for hx VTE which is being held due to acute illness/ renal failure (est CrCl <59ml/min).  CBC wnl.    Goal of Therapy:  Heparin level 0.3-0.7 units/ml; aPTT 66-102sec until correlates with heparin level Monitor platelets by anticoagulation  protocol: Yes   Plan:   Check baseline heparin level & aPTT  Initiate heparin infusion @ 550 units/hr  Check 8hr heparin level & aPTT  Daily heparin level & CBC while on heparin; Daily aPTT until heparin level correlates with aPTT  Biagio Borg 03/08/2016,5:16 PM

## 2016-03-08 NOTE — ED Triage Notes (Signed)
Patient brought in by EMS from [facility] with change in mental alertness, more lethargic, staff at facility report last known normal last night; EMS noticed dried vomit when they picked up patient; patient stroke screen normal, BG 285 at 11:25 am, they started 258ml NS bolus

## 2016-03-08 NOTE — ED Provider Notes (Signed)
Pt is a 55 y.o. female who presents with  Chief Complaint  Patient presents with  . Altered Mental Status  The patient has a history of multiple chronic medical conditions. She has multiple sclerosis and is bedridden. Patient has had recurrent sepsis infections. She presented to the emergency room today from the nursing facility with a change in her mental status.  Physical Exam  HENT:  Head: Normocephalic and atraumatic.  Mouth/Throat: No oropharyngeal exudate.  Eyes: Conjunctivae are normal. Left eye exhibits no discharge. No scleral icterus.  Neck: No JVD present. No tracheal deviation present. No thyromegaly present.  Cardiovascular: Normal rate, regular rhythm and normal heart sounds.   Pulmonary/Chest: Effort normal and breath sounds normal. No stridor. No respiratory distress. She has no wheezes.  Abdominal: She exhibits no distension.  Musculoskeletal: She exhibits no tenderness or deformity.  Muscular atrophy noted in the extremities  Neurological: She is alert.  Generalized weakness, patient unable to move out of bed  Skin: Skin is warm. No rash noted. She is not diaphoretic. No erythema.  Psychiatric: Affect normal.    Clinical Course  .Critical Care Performed by: Dorie Rank Authorized by: Dorie Rank   Critical care provider statement:    Critical care time (minutes):  35   Critical care was necessary to treat or prevent imminent or life-threatening deterioration of the following conditions:  Circulatory failure and shock   Critical care was time spent personally by me on the following activities:  Discussions with consultants, evaluation of patient's response to treatment, examination of patient, ordering and performing treatments and interventions, ordering and review of laboratory studies, ordering and review of radiographic studies, pulse oximetry, re-evaluation of patient's condition, obtaining history from patient or surrogate and review of old charts     EKG  Interpretation  Date/Time:  Tuesday March 08 2016 11:54:31 EDT Ventricular Rate:  120 PR Interval:    QRS Duration: 88 QT Interval:  305 QTC Calculation: 431 R Axis:   39 Text Interpretation:  Sinus tachycardia No significant change since last tracing Confirmed by Eligio Angert  MD-J, Tellis Spivak ([id number]) on 03/08/2016 4:18:02 PM      There was difficulty in obtaining IV access. Patient ultimately was given a 30 mL/kg bolus. She remained hypotensive. We have added on pressors.. The patient was given empiric antibiotics.  Her mental status did improve somewhat with fluid resuscitation and however her blood pressure did not.  Plan on admission to the intensive care unit.    Hypotension, unspecified hypotension type   Medical screening examination/treatment/procedure(s) were conducted as a shared visit with non-physician practitioner(s) and myself.  I personally evaluated the patient during the encounter.        Dorie Rank, MD 03/08/16 [phone]

## 2016-03-08 NOTE — ED Notes (Signed)
Patient has some type of drainage bag coming from upper left back, draining clear, yellowish liquid. Patient's blood glucose is 208

## 2016-03-08 NOTE — ED Notes (Signed)
Bed: [id number] Expected date:  Expected time:  Means of arrival:  Comments: 82F/increased lethargy/AMS

## 2016-03-08 NOTE — Progress Notes (Addendum)
[facility] Progress Note Patient Name: [name] DOB: [birth year] MRN: [id number]   Date of Service  03/08/2016  HPI/Events of Note  Sepsis - Repeat Assessment  Sepsis - Repeat Assessment  Performed at:  415 pm 9/26 with Rahul D  PA Vitals     Blood pressure 92/65, pulse 110, temperature 98.5 F (36.9 C), temperature source Oral, resp. rate 21, height 5\' 5"  (1.651 m), weight 62.3 kg (137 lb 5.6 oz), SpO2 100 %.  Patricia:     Tachycardic  Lungs:    CTA  Capillary Refill:   <2 sec  Peripheral Pulse:   Radial pulse palpable  Skin:     Dry       eICU Interventions       Intervention Category Major Interventions: Infection - evaluation and management  Teria Khachatryan J. 03/08/2016, 6:39 PM

## 2016-03-08 NOTE — ED Notes (Signed)
Writer notified PA of abnormal I-stat lactic.

## 2016-03-08 NOTE — ED Notes (Signed)
CBG: 208 

## 2016-03-08 NOTE — ED Notes (Signed)
Nurse at bedside completing ultra sound IV & collecting labs

## 2016-03-08 NOTE — ED Notes (Signed)
Pt is in xray

## 2016-03-08 NOTE — Progress Notes (Signed)
[facility] consulted to restart IV heparin for VTE treatment.  Patient had been on Xarelto 20mg  daily PTA with last reported dose on 9/25 @ 17:00.  PLAN: Will restart heparin as previously ordered @ 550 units/hr Check aPTT and Heparin Level 8 hr after heparin restarted Follow CBC daily  Leone Haven, PharmD

## 2016-03-08 NOTE — Progress Notes (Signed)
Location:  [place] Room Number: [place] of Service:  SNF [phone]) Provider:  Marlowe Sax FNP-C    Patient Care Team: [place] ([place])  Extended Emergency Contact Information Primary Emergency Contact: [facility] Address: [address] [place], [place] [address] Johnnette Litter of [place] Phone: [phone] Mobile Phone: [phone] Relation: Relative Secondary Emergency Contact: Hughes,Michael Address: [address]          [place], [place] [address] Johnnette Litter of [place] Phone: [phone] Mobile Phone: [phone] Relation: Relative  Code Status: Full Code  Goals of care: Advanced Directive information Advanced Directives 02/29/2016  Does patient have an advance directive? Yes  Type of Advance Directive [place]  Does patient want to make changes to advanced directive? No - Patient declined  Copy of advanced directive(s) in chart? No - copy requested  Would patient like information on creating an advanced directive? -  Pre-existing out of facility DNR order (yellow form or pink MOST form) -     Chief Complaint  Patient presents with  . Acute Visit    Fever, Abd pain     HPI:  Pt is a 55 y.o. female seen today at [facility] for an acute visit for evaluation of fever and abdominal pain. She has a medical history of Type 2 DM, CKD stage 3, HTN, stroke, MS, Left Nephrolithiasis among other conditions. She is seen in her room today per facility Nurse request who states patient's Temp 100.4 , HR 144, and B/P 73/47. Facility Nurse also reports patient has had no urine output from indwelling Foley Catheter and Nephrostomy. She complains of abdominal pain and increased weakness. She denies any nausea, vomiting or diarrhea.    Past Medical History:  Diagnosis Date  . Acute renal failure superimposed on stage 3 chronic kidney disease  ([place]) 07/21/2015  . Anemia    chronic   . Diabetes mellitus without complication ([place])   . Diabetic neuropathy ([place])   . DVT (deep venous thrombosis) ([place])   . DVT (deep venous thrombosis) ([place])   . GERD (gastroesophageal reflux disease)   . Hypertension   . Left nephrolithiasis   . Leukocytosis   . Lymph edema    chronic   . Malnutrition of moderate degree 07/21/2015  . Multiple sclerosis diagnosed 2002-not on Therapy any longer 06/26/2012  . Muscle weakness (generalized)   . Neuromuscular disorder ([place])   . Neuromuscular dysfunction of bladder   . Polyneuropathy ([place])   . Presence of indwelling urinary catheter   . Protein calorie malnutrition ([place])   . Stage 4 decubitus ulcer ([place]) 07/05/2015  . Stroke ([place])   . UTI (lower urinary tract infection)    Past Surgical History:  Procedure Laterality Date  . ESOPHAGOGASTRODUODENOSCOPY Left 01/02/2015   Procedure: ESOPHAGOGASTRODUODENOSCOPY (EGD);  Surgeon: Arta Silence, MD;  Location: [facility];  Service: Endoscopy;  Laterality: Left;  . HERNIA MESH REMOVAL    . IR GENERIC HISTORICAL  02/23/2016   IR NEPHROSTOMY EXCHANGE LEFT 02/23/2016 Sandi Mariscal, MD [facility] RAD  . NEPHROSTOMY TUBE PLACEMENT ([place] HX)    . TUBAL LIGATION    . tubes tided      No Known Allergies    Medication List       Accurate as of 03/08/16 11:28 AM. Always use your most recent med list.  albuterol (2.5 MG/3ML) 0.083% nebulizer solution Commonly known as:  PROVENTIL Take 3 mLs (2.5 mg total) by nebulization every 6 (six) hours as needed for wheezing or shortness of breath.   atorvastatin 10 MG tablet Commonly known as:  LIPITOR Take 10 mg by mouth daily.   baclofen 10 MG tablet Commonly known as:  LIORESAL Take 1 tablet (10 mg total) by mouth 3 (three) times daily.   famotidine 20 MG tablet Commonly known as:  PEPCID Take 1 tablet (20 mg total) by mouth 2 (two) times daily.   feeding supplement (ENSURE ENLIVE) Liqd Take 237 mLs by  mouth 2 (two) times daily between meals.   feeding supplement (PRO-STAT SUGAR FREE 64) Liqd Take 30 mLs by mouth 2 (two) times daily.   furosemide 20 MG tablet Commonly known as:  LASIX Take 40 mg by mouth 2 (two) times daily.   gabapentin 100 MG capsule Commonly known as:  NEURONTIN Take 1 capsule (100 mg total) by mouth at bedtime.   glipiZIDE 5 MG tablet Commonly known as:  GLUCOTROL Take 5 mg by mouth 2 (two) times daily.   lisinopril 2.5 MG tablet Commonly known as:  PRINIVIL,ZESTRIL Take 2.5 mg by mouth daily.   metFORMIN 500 MG tablet Commonly known as:  GLUCOPHAGE Take 500 mg by mouth 2 (two) times daily.   multivitamin with minerals Tabs tablet Take 1 tablet by mouth daily.   Omega 3 1000 MG Caps Take 1,000 mg by mouth daily.   polyethylene glycol packet Commonly known as:  MIRALAX / GLYCOLAX Take 17 g by mouth 2 (two) times daily.   rivaroxaban 20 MG Tabs tablet Commonly known as:  XARELTO Take 1 tablet (20 mg total) by mouth daily with supper. Reported on 08/12/2015   senna-docusate 8.6-50 MG tablet Commonly known as:  Senokot-S Take 1 tablet by mouth 2 (two) times daily.   solifenacin 5 MG tablet Commonly known as:  VESICARE Take 1 tablet (5 mg total) by mouth daily.       Review of Systems  Constitutional: Positive for fatigue and fever. Negative for activity change, appetite change and chills.  HENT: Negative for congestion, rhinorrhea, sinus pressure, sneezing and sore throat.   Eyes: Negative.   Respiratory: Negative for cough, chest tightness, shortness of breath and wheezing.   Cardiovascular: Negative for chest pain, palpitations and leg swelling.  Gastrointestinal: Positive for abdominal pain. Negative for abdominal distention, constipation, diarrhea, nausea and vomiting.  Endocrine: Negative.   Genitourinary: Negative for urgency.       No urine output from foley Catheter and Left Nephrostomy  Tube.   Musculoskeletal: Positive for gait  problem.  Skin: Negative for color change, pallor, rash and wound.  Neurological: Negative for dizziness, seizures, syncope, light-headedness, numbness and headaches.  Hematological: Does not bruise/bleed easily.  Psychiatric/Behavioral: Negative for agitation, confusion, hallucinations and sleep disturbance. The patient is not nervous/anxious.     Immunization History  Administered Date(s) Administered  . Influenza Split 06/26/2012  . Influenza-Unspecified 04/02/2015  . PPD Test 01/04/2015, 07/09/2015, 07/23/2015, 09/02/2015, 12/31/2015  . Pneumococcal-Unspecified 03/13/2014   Pertinent  Health Maintenance Due  Topic Date Due  . FOOT EXAM  10/19/1968  . OPHTHALMOLOGY EXAM  10/19/1968  . PAP SMEAR  10/20/1979  . COLONOSCOPY  10/19/2008  . MAMMOGRAM  09/19/2015  . INFLUENZA VACCINE  08/01/2016 (Originally 01/12/2016)  . HEMOGLOBIN A1C  06/19/2016   No flowsheet data found. Functional Status Survey:    Vitals:   03/08/16 1045  BP: (!) 73/47  Pulse: (!) 144  Resp: 20  Temp: (!) 100.4 F (38 C)  SpO2: 97%  Weight: 94 lb 14.4 oz (43 kg)  Height: 5\' 3"  (1.6 m)   Body mass index is 16.81 kg/m. Physical Exam  Constitutional:  Thin, frail lethargic   HENT:  Head: Normocephalic.  Mouth/Throat: Oropharynx is clear and moist. No oropharyngeal exudate.  Eyes: Conjunctivae and EOM are normal. Pupils are equal, round, and reactive to light. Right eye exhibits no discharge. Left eye exhibits no discharge. No scleral icterus.  Neck: Normal range of motion. No JVD present. No thyromegaly present.  Cardiovascular: Normal heart sounds and intact distal pulses.  Exam reveals no gallop and no friction rub.   No murmur heard. Apical Pulse Tachy 134 b/min   Pulmonary/Chest: Effort normal and breath sounds normal. No respiratory distress. She has no wheezes. She has no rales.  Abdominal: Soft. Bowel sounds are normal. She exhibits no distension. There is no rebound.  Generalized abdominal  Tenderness on palpation patient guarding Holding providers hand due to pain.   Genitourinary:  Genitourinary Comments: Foley cathter and Left Nephrostomy without any urine draining.   Musculoskeletal: She exhibits no edema, tenderness or deformity.  RUE /Bilateral LE's weakness   Lymphadenopathy:    She has no cervical adenopathy.  Neurological: She is alert.  Generalized weakness   Skin: Skin is warm and dry. No rash noted. No erythema. No pallor.  Left Nephrostomy tube drsg dry clean and intact. Bilateral Lower extremity skin dark in color, thick and dry.   Psychiatric: She has a normal mood and affect.    Labs reviewed:  Recent Labs  11/11/15 1447  11/12/15 2334  11/15/15 [id number] 11/16/15 0334 11/17/15 0709  12/25/15 0617 12/29/15 1355 01/06/16 02/08/16 1724  NA  --   < >  --   < > 143 141 140  < > 137 137 139 141  K  --   < >  --   < > 3.3* 4.2 3.7  < > 4.2 3.9 3.7 3.5  CL  --   < >  --   < > 110 112* 110  < > 107 104  --  109  CO2  --   < >  --   < > 25 24 25   < > 26 28  --  27  GLUCOSE  --   < >  --   < > 121* 108* 93  < > 130* 195*  --  96  BUN  --   < >  --   < > 16 15 13   < > <5* 12 19 15   CREATININE 1.27*  < >  --   < > 0.61 0.61 0.54  < > 0.52 0.61 0.5 0.51  CALCIUM  --   < >  --   < > 9.6 9.2 9.5  < > 9.6 10.2  --  10.4*  MG 1.7  --   --   < > 1.9 1.7 1.8  --   --   --   --   --   PHOS 4.3  --  1.8*  --   --   --  3.2  --   --   --   --   --   < > = values in this interval not displayed.  Recent Labs  11/17/15 0709 12/18/15 1035 12/19/15 0744 01/06/16  AST 9* 15 12* 12*  ALT 15 8* 7* 17  ALKPHOS 62 40 50 67  BILITOT 0.4 0.7 0.5  --   PROT 6.0* 6.6 6.6  --   ALBUMIN 2.0* 2.2* 2.3*  --     Recent Labs  12/28/15 0414 12/29/15 1355 12/30/15 0510 12/30/15 2003 01/06/16 02/08/16 1724  WBC 17.6* 16.2* 16.7*  --  104.0 13.3*  NEUTROABS 12.1* 11.4*  --   --   --  7.3  HGB 7.1* 7.0* 6.6* 9.4* 9.7* 8.4*  HCT 22.9* 23.0* 21.3* 28.8* 31* 25.5*  MCV 85.8 85.5  85.2  --   --  89.5  PLT 724* 809* 818*  --  610* 412*   Lab Results  Component Value Date   TSH 1.787 07/21/2015   Lab Results  Component Value Date   HGBA1C 6.0 (H) 12/18/2015   Lab Results  Component Value Date   CHOL 143 07/13/2015   HDL 41 07/13/2015   LDLCALC 88 07/13/2015   TRIG 71 07/13/2015   Assessment/Plan 1. Lethargy Generalized weakness. Will send to ER for evaluation possible sepsis.   2. Idiopathic hypotension B/p 73/47 rechecked 62/44. Possible sepsis.Send to ER for evaluation.   3. Urinary retention No urine output in indwelling Foley Catheter and Left Nephrostomy noted. With diffuse abdominal pain. Possible retention. Send to ER for evaluation.   4. Tachycardia HR 134-144 b/min.Possible sepsis. Will Send to ER  5. Fever, unspecified Temp 100.4 possible UTI or early sepsis. Send to ER for evaluation.   6. Generalized abdominal pain Guarding on exam. No urine output on indwelling foley Catheter and Nephrostomy. Send to ER for evaluation     Family/ staff Communication: Reviewed plan of care with patient, Facility Nurse and Nurse Supervisor to send to ER ASAP.    Labs/tests ordered:  Send to ER ASAP

## 2016-03-08 NOTE — Progress Notes (Signed)
[place] Progress Note Patient Name: STACHIA KEIRSTEAD DOB: 1960-08-27 MRN: [id number]   Date of Service  03/08/2016  HPI/Events of Note  D/w IR further. It appears that this area on CT is more ill defined than prior thought making the benefit very low and risk high , LActic is cleared, clinical status is OKay.    eICU Interventions  No IR drainage planned. Continued ABX, fluids, re assess bmet and hope if renal fxn clears can repeat with contrast study Will restart heparin     Intervention Category Intermediate Interventions: Communication with other healthcare providers and/or family  Raylene Miyamoto. 03/08/2016, 10:48 PM

## 2016-03-08 NOTE — Clinical Social Work Note (Signed)
Clinical Social Work Assessment  Patient Details  Name: [name] MRN: [id number] Date of Birth: 1960-12-07  Date of referral:  03/08/16               Reason for consult:  Other (Comment Required) (Patient from [facility])                Permission sought to share information with:    Permission granted to share information::     Name::        Agency::     Relationship::     Contact Information:     Housing/Transportation Living arrangements for the past 2 months:  [place] (Patient reports she has been at [facility] for one week) Source of Information:  Patient Patient Interpreter Needed:  None Criminal Activity/Legal Involvement Pertinent to Current Situation/Hospitalization:  No - Comment as needed Significant Relationships:  Other Family Members (Patient reports cousins/aunt) Lives with:  Facility Resident Do you feel safe going back to the place where you live?    Need for family participation in patient care:     Care giving concerns: Unknown   Facilities manager / plan: CSW spoke with patient at bedside with no family present. Patient reports she is from [facility] and she has been there for one week. Patient reports last night, she was throwing up and yesterday she stated, "I didn't feel good and I wasn't right". Patient reports she receives assistance with bathing and dressing, however, she can feed herself. Patient reports she has not had any falls. Patient reports she has cousins and an aunt that reside in [place]. Patient reports two names for first counsins, [name] and [name] as her main support. Patient reports the contact number for [name] is [phone]. No questions noted for CSW at this time.    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) PT Recommendations:  Not assessed at this time Information / Referral to community resources:  Other (Comment Required) (None  provided)  Patient/Family's Response to care: Unknown  Patient/Family's Understanding of and Emotional Response to Diagnosis, Current Treatment, and Prognosis: Unknown  Emotional Assessment Appearance:  Appears stated age Attitude/Demeanor/Rapport:    Affect (typically observed):  Accepting Orientation:  Oriented to Self, Oriented to Situation, Oriented to Place Alcohol / Substance use:    Psych involvement (Current and /or in the community):  No (Comment) (Not at this time)  Discharge Needs  Concerns to be addressed:  Other (Comment Required (Unknown) Readmission within the last 30 days:    Current discharge risk:  Other (Unknown) Barriers to Discharge:  Other (Unknown)   Patricia Sabin, LCSW 03/08/2016, 3:00 PM

## 2016-03-08 NOTE — H&P (Signed)
PULMONARY / CRITICAL CARE MEDICINE   Name: [name] MRN: [id number] DOB: 1960/10/19    ADMISSION DATE:  03/08/2016 CONSULTATION DATE:  03/08/16  REFERRING MD:  Dr. Tomi Bamberger  CHIEF COMPLAINT:  AMS   HISTORY OF PRESENT ILLNESS:   55 y/o F with PMH of MultipleSclerosis (dx 2002), GERD, malnutrition, DM with neuropathy, CKD III (baseline sr cr ~ 0.5), DVT (**), chronic indwelling foley, stage 4 decubitus ulcer, anemia, HTN and lives at a SNF who presented to [facility] on 9/26 with altered mental status.   Per report, she was found at [facility] with altered mental status / lethargic.  She was last seen normal the evening prior.  EMS reported dried vomit on the patient when the picked her up.  Glucose was wnl. Initial ER evaluation notable for Na 139, K 5.5, Cl 105, CO2 22, sr cr 2.57 (up from 0.51 01/2016), glucose 212, AG 12, albumin 3.2, lactic acid 5.56, WBC 18.4, Hgb 10.7, and platelets 382.  She was noted to have drainage from her decubitus wounds.  She was found to be febrile to 100.4, tachycardic (144) and hypotensive (78/38).  Sepsis therapy activated with fluid resuscitation and empiric antibiotics.  CXR was negative for acute infectious process.   PCCM called for evaluation.    PAST MEDICAL HISTORY :  She  has a past medical history of Acute renal failure superimposed on stage 3 chronic kidney disease ([place]) (07/21/2015); Anemia; Diabetes mellitus without complication ([place]); Diabetic neuropathy (HCC); DVT (deep venous thrombosis) ([place]); DVT (deep venous thrombosis) ([place]); GERD (gastroesophageal reflux disease); Hypertension; Left nephrolithiasis; Leukocytosis; Lymph edema; Malnutrition of moderate degree (07/21/2015); Multiple sclerosis diagnosed 2002-not on Therapy any longer (06/26/2012); Muscle weakness (generalized); Neuromuscular disorder ([place]); Neuromuscular dysfunction of bladder; Polyneuropathy ([place]); Presence of indwelling urinary catheter; Protein calorie malnutrition ([place]); Stage 4  decubitus ulcer ([place]) (07/05/2015); Stroke [facility]); and UTI (lower urinary tract infection).  PAST SURGICAL HISTORY: She  has a past surgical history that includes Hernia mesh removal; tubes tided; Tubal ligation; Esophagogastroduodenoscopy (Left, 01/02/2015); ir generic historical (02/23/2016); and NEPHROSTOMY TUBE PLACEMENT ([place] HX).  No Known Allergies  No current facility-administered medications on file prior to encounter.    Current Outpatient Prescriptions on File Prior to Encounter  Medication Sig  . albuterol (PROVENTIL) (2.5 MG/3ML) 0.083% nebulizer solution Take 3 mLs (2.5 mg total) by nebulization every 6 (six) hours as needed for wheezing or shortness of breath.  . Amino Acids-Protein Hydrolys (FEEDING SUPPLEMENT, PRO-STAT SUGAR FREE 64,) LIQD Take 30 mLs by mouth 2 (two) times daily.  Marland Kitchen atorvastatin (LIPITOR) 10 MG tablet Take 10 mg by mouth at bedtime.   . baclofen (LIORESAL) 10 MG tablet Take 1 tablet (10 mg total) by mouth 3 (three) times daily.  . famotidine (PEPCID) 20 MG tablet Take 1 tablet (20 mg total) by mouth 2 (two) times daily.  . furosemide (LASIX) 20 MG tablet Take 40 mg by mouth 2 (two) times daily.  Marland Kitchen gabapentin (NEURONTIN) 100 MG capsule Take 1 capsule (100 mg total) by mouth at bedtime.  Marland Kitchen glipiZIDE (GLUCOTROL) 5 MG tablet Take 5 mg by mouth 2 (two) times daily.  Marland Kitchen lisinopril (PRINIVIL,ZESTRIL) 2.5 MG tablet Take 2.5 mg by mouth daily.  . metFORMIN (GLUCOPHAGE) 500 MG tablet Take 500 mg by mouth 2 (two) times daily.  . Omega 3 1000 MG CAPS Take 1,000 mg by mouth daily.  . polyethylene glycol (MIRALAX / GLYCOLAX) packet Take 17 g by mouth 2 (two) times daily.  Marland Kitchen  rivaroxaban (XARELTO) 20 MG TABS tablet Take 1 tablet (20 mg total) by mouth daily with supper. Reported on 08/12/2015  . senna-docusate (SENOKOT-S) 8.6-50 MG tablet Take 1 tablet by mouth 2 (two) times daily.  . solifenacin (VESICARE) 5 MG tablet Take 1 tablet (5 mg total) by mouth daily.  . feeding  supplement, ENSURE ENLIVE, (ENSURE ENLIVE) LIQD Take 237 mLs by mouth 2 (two) times daily between meals. (Patient not taking: Reported on 03/08/2016)  . Multiple Vitamin (MULTIVITAMIN WITH MINERALS) TABS tablet Take 1 tablet by mouth daily. (Patient not taking: Reported on 03/08/2016)    FAMILY HISTORY:  Her indicated that her mother is alive. She indicated that her father is alive.    SOCIAL HISTORY: She  reports that she quit smoking about 15 months ago. Her smoking use included Cigarettes. She has a 20.00 pack-year smoking history. She has never used smokeless tobacco. She reports that she drinks about 3.0 oz of alcohol per week . She reports that she does not use drugs.  REVIEW OF SYSTEMS:   Unable to complete due to altered mental status   SUBJECTIVE:  Unable to provide history, confused.  VITAL SIGNS: BP 90/62 (BP Location: Left Arm)   Pulse 109   Temp 100 F (37.8 C) (Rectal)   Resp (!) 30   SpO2 95%   HEMODYNAMICS:    VENTILATOR SETTINGS:    INTAKE / OUTPUT: No intake/output data recorded.  PHYSICAL EXAMINATION: General:  Chronically ill appearing, altered but protecting airway. Neuro:  Altered but moving upper ext to command, bed ridden. HEENT:  [place]/AT, PERRL, EOM-I and MMM. Cardiovascular:  RRR, Nl S1/S2, -M/R/G. Lungs:  CTA bilaterally. Abdomen:  Soft, diffusely tender, guarding, ND and +BS. Musculoskeletal: -edema and -tenderness. Skin:  Intact.  LABS:  BMET  Recent Labs Lab 03/08/16 1240  NA 139  K 5.5*  CL 105  CO2 22  BUN 59*  CREATININE 2.57*  GLUCOSE 212*    Electrolytes  Recent Labs Lab 03/08/16 1240  CALCIUM 10.3    CBC  Recent Labs Lab 03/08/16 1411  WBC 18.4*  HGB 10.7*  HCT 33.7*  PLT 382    Coag's No results for input(s): APTT, INR in the last 168 hours.  Sepsis Markers  Recent Labs Lab 03/08/16 1500 03/08/16 1518  LATICACIDVEN  --  5.56*  PROCALCITON 61.85  --     ABG No results for input(s): PHART,  PCO2ART, PO2ART in the last 168 hours.  Liver Enzymes  Recent Labs Lab 03/08/16 1240  AST 21  ALT 13*  ALKPHOS 40  BILITOT 0.6  ALBUMIN 3.2*    Cardiac Enzymes  Recent Labs Lab 03/08/16 1500  TROPONINI 0.03*    Glucose  Recent Labs Lab 03/08/16 1214 03/08/16 1551  GLUCAP 208* 159*    Imaging Dg Chest Port 1 View  Result Date: 03/08/2016 CLINICAL DATA:  Fever, abdominal pain, tachycardia EXAM: PORTABLE CHEST 1 VIEW COMPARISON:  Portable chest x-ray of 12/30/2015 FINDINGS: No active infiltrate or effusion is seen. Mediastinal and hilar contours are unremarkable. The heart is within normal limits in size. No bony abnormality is seen. IMPRESSION: No active disease. Electronically Signed   By: Ivar Drape M.D.   On: 03/08/2016 13:25     STUDIES:    CULTURES: BCx2 9/26 >>  UC 9/26 >>   ANTIBIOTICS: Vanco 9/26 (empiric) >>  Zosyn 9/26 (empiric) >>  SIGNIFICANT EVENTS: 9/26  Admit from SNF with AMS, concern for sepsis.  Clear CXR.  LINES/TUBES: L IJ TLC 9/26>>>  DISCUSSION: 55 y/o F, SNF resident, with multiple sclerosis admitted on 9/26 with altered mental status and concern for sepsis.  Potential sources of infection include urine & decubitus ulcers.   ASSESSMENT / PLAN:  PULMONARY A: At Risk Respiratory Failure - in setting of altered mental status Possible Aspiration Event - vomiting prior to admit P:   Aspiration precautions O2 as needed to support sats >92% Monitor CXR for development of infiltrate with hx of vomiting / possible aspiration   CARDIOVASCULAR A:  Shock - concern for sepsis +/- component of hypovolemia with vomiting Tachycardia - reactive in setting of sepsis  P:  Hemodynamic monitoring in ICU/SDU Volume resuscitation, 30 ml/kg Neosynephrine for MAP >65 Assess cortisol  See ID CVP 1L NS  RENAL A:   AKI - in setting of possible sepsis, volume depletion and chronic disease CKD III  Elevated Lactate P:   Trend Lactic  acid  Monitor BMP / UOP  Replace electrolytes as indicated  GASTROINTESTINAL A:   Nausea / Vomiting  P:   NPO  PRN zofran  Abdominal CT now.  HEMATOLOGIC A:   Anemia - suspect of chronic disease, no active bleeding P:  Trend CBC  Heparin drip in place of xarelto  INFECTIOUS A:   Shock, rule out sepsis.  Possible component of hypovolemia with vomiting.   P:   Abx as above  Follow cultures as above   ENDOCRINE A:   DM  Diabetic Neuropathy    P:   Trend CBG SSI   NEUROLOGIC A:   Acute Encephalopathy - unclear etiology, ddx includes sepsis, medications, CVA P:   RASS goal: +2 No sedation Continue baclofen  FAMILY  - Updates: No family bedside to updated.  - Inter-disciplinary family meet or Palliative Care meeting due by:  10/3  The patient is critically ill with multiple organ systems failure and requires high complexity decision making for assessment and support, frequent evaluation and titration of therapies, application of advanced monitoring technologies and extensive interpretation of multiple databases.   Critical Care Time devoted to patient care services described in this note is  45  Minutes. This time reflects time of care of this signee Dr Jennet Maduro. This critical care time does not reflect procedure time, or teaching time or supervisory time of PA/NP/Med student/Med Resident etc but could involve care discussion time.  Rush Farmer, M.D. [facility] Pulmonary/Critical Care Medicine. Pager: [phone]. After hours pager: [phone].

## 2016-03-08 NOTE — Progress Notes (Addendum)
[facility] Progress Note Patient Name: BRAYLEN DUDAS DOB: 17-Apr-1961 MRN: [id number]   Date of Service  03/08/2016  HPI/Events of Note  Called as drop IN BP again Had been off neo cvp 12 lactioc repeat awaited I went ahead and reviewed CT result which reveals Abscess dome of bladder Last xarelto about 24 hours I ordered stat coags Called IR Will need stat IR drainage if able, if not, will call urology likley we can have IR approach  eICU Interventions  Source control imperative as far as risk benefit ratio bleeding to drainage in setting septic shock, favors drainage Add empiric antifingal for now Neo restarted        Raylene Miyamoto. 03/08/2016, 9:48 PM

## 2016-03-08 NOTE — Procedures (Signed)
Central Venous Catheter Insertion Procedure Note [name] [id number] 1961/05/30  Procedure: Insertion of Central Venous Catheter Indications: Assessment of intravascular volume, Drug and/or fluid administration and Frequent blood sampling  Procedure Details Consent: Risks of procedure as well as the alternatives and risks of each were explained to the (patient/caregiver).  Consent for procedure obtained. Time Out: Verified patient identification, verified procedure, site/side was marked, verified correct patient position, special equipment/implants available, medications/allergies/relevent history reviewed, required imaging and test results available.  Performed  Maximum sterile technique was used including antiseptics, cap, gloves, gown, hand hygiene, mask and sheet. Skin prep: Chlorhexidine; local anesthetic administered A antimicrobial bonded/coated triple lumen catheter was placed in the left internal jugular vein using the Seldinger technique.  Evaluation Blood flow good Complications: No apparent complications Patient did tolerate procedure well. Chest X-ray ordered to verify placement.  CXR: pending.  Procedure performed under direct ultrasound guidance for real time vessel cannulation.      Montey Hora, [facility] Pager: [phone]  or [phone] 03/08/2016, 5:26 PM  Rush Farmer, M.D. [facility]. Pager: [phone]. After hours pager: [phone].

## 2016-03-08 NOTE — ED Provider Notes (Signed)
[facility] DEPT Provider Note   CSN: [id number] Arrival date & time: 03/08/16  1144     History   Chief Complaint Chief Complaint  Patient presents with  . Altered Mental Status    HPI [name] is a 55 y.o. female.  HPI Patient presents to the emergency department with altered mental status from the nursing facility.  The patient lives in a nursing facility and the staff noticed that she was altered.  They do not give much history other than this, the patient is confused, does not give me much history.  Staff reported that she had no vomiting or diarrhea.  Past Medical History:  Diagnosis Date  . Acute renal failure superimposed on stage 3 chronic kidney disease ([place]) 07/21/2015  . Anemia    chronic   . Diabetes mellitus without complication ([place])   . Diabetic neuropathy ([place])   . DVT (deep venous thrombosis) ([place])   . DVT (deep venous thrombosis) ([place])   . GERD (gastroesophageal reflux disease)   . Hypertension   . Left nephrolithiasis   . Leukocytosis   . Lymph edema    chronic   . Malnutrition of moderate degree 07/21/2015  . Multiple sclerosis diagnosed 2002-not on Therapy any longer 06/26/2012  . Muscle weakness (generalized)   . Neuromuscular disorder ([place])   . Neuromuscular dysfunction of bladder   . Polyneuropathy ([place])   . Presence of indwelling urinary catheter   . Protein calorie malnutrition ([place])   . Stage 4 decubitus ulcer ([place]) 07/05/2015  . Stroke ([place])   . UTI (lower urinary tract infection)     Patient Active Problem List   Diagnosis Date Noted  . Neuropathic pain 01/11/2016  . Attention to nephrostomy ([place]) 01/11/2016  . Acute renal failure ([place])   . Partial small bowel obstruction ([place]) 12/24/2015  . Bacteremia due to coagulase-negative Staphylococcus 12/22/2015  . Left nephrolithiasis 12/22/2015  . Gram-negative bacteremia ([place]) 12/22/2015  . Palliative care encounter   . SIRS (systemic inflammatory response syndrome) ([place]) 12/18/2015   . Decubitus ulcer   . Controlled diabetes mellitus type 2 with complications ([place])   . Neurogenic bladder 11/11/2015  . Physical deconditioning 11/11/2015  . Chronic indwelling Foley catheter 09/30/2015  . Hypokalemia 09/04/2015  . Dyslipidemia associated with type 2 diabetes mellitus ([place]) 08/28/2015  . GERD without esophagitis 08/28/2015  . Acute renal failure superimposed on stage 3 chronic kidney disease ([place]) 07/21/2015  . Leukocytosis 07/21/2015  . Anemia of chronic disease 07/21/2015  . Hyperkalemia 07/21/2015  . Catheter-associated urinary tract infection ([place]) 07/21/2015  . Malnutrition of moderate degree 07/21/2015  . DVT, lower extremity ([place]) 12/28/2014  . Multiple sclerosis diagnosed 2002-not on Therapy any longer 06/26/2012    Past Surgical History:  Procedure Laterality Date  . ESOPHAGOGASTRODUODENOSCOPY Left 01/02/2015   Procedure: ESOPHAGOGASTRODUODENOSCOPY (EGD);  Surgeon: Arta Silence, MD;  Location: [facility];  Service: Endoscopy;  Laterality: Left;  . HERNIA MESH REMOVAL    . IR GENERIC HISTORICAL  02/23/2016   IR NEPHROSTOMY EXCHANGE LEFT 02/23/2016 Sandi Mariscal, MD [facility]-INTERV RAD  . NEPHROSTOMY TUBE PLACEMENT ([facility])    . TUBAL LIGATION    . tubes tided      OB History    No data available       Home Medications    Prior to Admission medications   Medication Sig Start Date End Date Taking? Authorizing Provider  albuterol (PROVENTIL) (2.5 MG/3ML) 0.083% nebulizer solution Take 3 mLs (2.5 mg total) by nebulization  every 6 (six) hours as needed for wheezing or shortness of breath. 12/30/15  Yes Robbie Lis, MD  Amino Acids-Protein Hydrolys (FEEDING SUPPLEMENT, PRO-STAT SUGAR FREE 64,) LIQD Take 30 mLs by mouth 2 (two) times daily. 11/18/15  Yes Cherene Altes, MD  atorvastatin (LIPITOR) 10 MG tablet Take 10 mg by mouth at bedtime.    Yes Historical Provider, MD  baclofen (LIORESAL) 10 MG tablet Take 1 tablet (10 mg total) by mouth 3 (three) times  daily. 11/18/15  Yes Cherene Altes, MD  famotidine (PEPCID) 20 MG tablet Take 1 tablet (20 mg total) by mouth 2 (two) times daily. 11/18/15  Yes Cherene Altes, MD  furosemide (LASIX) 20 MG tablet Take 40 mg by mouth 2 (two) times daily. 01/29/16  Yes Historical Provider, MD  gabapentin (NEURONTIN) 100 MG capsule Take 1 capsule (100 mg total) by mouth at bedtime. 11/18/15  Yes Cherene Altes, MD  glipiZIDE (GLUCOTROL) 5 MG tablet Take 5 mg by mouth 2 (two) times daily. 11/30/15  Yes Historical Provider, MD  lisinopril (PRINIVIL,ZESTRIL) 2.5 MG tablet Take 2.5 mg by mouth daily. 11/30/15  Yes Historical Provider, MD  metFORMIN (GLUCOPHAGE) 500 MG tablet Take 500 mg by mouth 2 (two) times daily. 01/29/16  Yes Historical Provider, MD  Nutritional Supplements (NUTRITIONAL SHAKE) LIQD Take 240 mLs by mouth 2 (two) times daily. *Med Pass 2.0*   Yes Historical Provider, MD  Omega 3 1000 MG CAPS Take 1,000 mg by mouth daily.   Yes Historical Provider, MD  polyethylene glycol (MIRALAX / GLYCOLAX) packet Take 17 g by mouth 2 (two) times daily. 11/18/15  Yes Cherene Altes, MD  rivaroxaban (XARELTO) 20 MG TABS tablet Take 1 tablet (20 mg total) by mouth daily with supper. Reported on 08/12/2015 11/18/15  Yes Cherene Altes, MD  senna-docusate (SENOKOT-S) 8.6-50 MG tablet Take 1 tablet by mouth 2 (two) times daily. 12/30/15  Yes Robbie Lis, MD  solifenacin (VESICARE) 5 MG tablet Take 1 tablet (5 mg total) by mouth daily. 11/18/15  Yes Cherene Altes, MD  feeding supplement, ENSURE ENLIVE, (ENSURE ENLIVE) LIQD Take 237 mLs by mouth 2 (two) times daily between meals. Patient not taking: Reported on 03/08/2016 12/30/15   Robbie Lis, MD  Multiple Vitamin (MULTIVITAMIN WITH MINERALS) TABS tablet Take 1 tablet by mouth daily. Patient not taking: Reported on 03/08/2016 07/09/15   Florencia Reasons, MD    Family History Family History  Problem Relation Age of Onset  . Diabetes Mother   . Alzheimer's disease Mother   .  Diabetes Father   . Diabetes Sister   . Scoliosis Sister     Social History Social History  Substance Use Topics  . Smoking status: Former Smoker    Packs/day: 1.00    Years: 20.00    Types: Cigarettes    Quit date: 11/19/2014  . Smokeless tobacco: Never Used  . Alcohol use 3.0 oz/week    5 Glasses of wine per week     Allergies   Review of patient's allergies indicates no known allergies.   Review of Systems Review of Systems  Level V caveat applies due to altered mental status Physical Exam Updated Vital Signs BP (!) 81/59 (BP Location: Left Arm)   Pulse 112   Temp 100 F (37.8 C) (Rectal)   Resp 22   SpO2 96%   Physical Exam  Constitutional: She appears well-developed and well-nourished. No distress.  HENT:  Head: Normocephalic and atraumatic.  Mouth/Throat: Oropharynx is clear and moist.  Eyes: Pupils are equal, round, and reactive to light.  Neck: Normal range of motion. Neck supple.  Cardiovascular: Normal rate, regular rhythm and normal heart sounds.  Exam reveals no gallop and no friction rub.   No murmur heard. Pulmonary/Chest: Effort normal and breath sounds normal. No respiratory distress. She has no wheezes.  Abdominal: Soft. Bowel sounds are normal. She exhibits no distension. There is no tenderness.  Neurological: She is alert. She exhibits normal muscle tone. Coordination normal.  Skin: Skin is warm and dry. No rash noted. No erythema.  Psychiatric: She has a normal mood and affect. Her behavior is normal.  Nursing note and vitals reviewed.    ED Treatments / Results  Labs (all labs ordered are listed, but only abnormal results are displayed) Labs Reviewed  COMPREHENSIVE METABOLIC PANEL - Abnormal; Notable for the following:       Result Value   Potassium 5.5 (*)    Glucose, Bld 212 (*)    BUN 59 (*)    Creatinine, Ser 2.57 (*)    Albumin 3.2 (*)    ALT 13 (*)    GFR calc non Af Amer 20 (*)    GFR calc Af Amer 23 (*)    All other  components within normal limits  CBC - Abnormal; Notable for the following:    WBC 18.4 (*)    RBC 3.67 (*)    Hemoglobin 10.7 (*)    HCT 33.7 (*)    RDW 15.7 (*)    All other components within normal limits  CBG MONITORING, ED - Abnormal; Notable for the following:    Glucose-Capillary 208 (*)    All other components within normal limits  I-STAT CG4 LACTIC ACID, ED - Abnormal; Notable for the following:    Lactic Acid, Venous 5.56 (*)    All other components within normal limits  CULTURE, BLOOD (ROUTINE X 2)  CULTURE, BLOOD (ROUTINE X 2)  URINE CULTURE  URINALYSIS, ROUTINE W REFLEX MICROSCOPIC (NOT AT [facility])  I-STAT CG4 LACTIC ACID, ED    EKG  EKG Interpretation None       Radiology Dg Chest Port 1 View  Result Date: 03/08/2016 CLINICAL DATA:  Fever, abdominal pain, tachycardia EXAM: PORTABLE CHEST 1 VIEW COMPARISON:  Portable chest x-ray of 12/30/2015 FINDINGS: No active infiltrate or effusion is seen. Mediastinal and hilar contours are unremarkable. The heart is within normal limits in size. No bony abnormality is seen. IMPRESSION: No active disease. Electronically Signed   By: Ivar Drape M.D.   On: 03/08/2016 13:25    Procedures Procedures (including critical care time)  Medications Ordered in ED Medications  piperacillin-tazobactam (ZOSYN) IVPB 2.25 g (not administered)  vancomycin (VANCOCIN) 500 mg in sodium chloride 0.9 % 100 mL IVPB (not administered)  sodium chloride 0.9 % bolus 1,000 mL (1,000 mLs Intravenous New Bag/Given 03/08/16 1333)    And  sodium chloride 0.9 % bolus 500 mL (0 mLs Intravenous Stopped 03/08/16 1536)  piperacillin-tazobactam (ZOSYN) IVPB 3.375 g (0 g Intravenous Stopped 03/08/16 1431)  vancomycin (VANCOCIN) IVPB 1000 mg/200 mL premix (0 mg Intravenous Stopped 03/08/16 1431)     Initial Impression / Assessment and Plan / ED Course  I have reviewed the triage vital signs and the nursing notes.  Pertinent labs & imaging results that were  available during my care of the patient were reviewed by me and considered in my medical decision making (see chart for details).  Clinical  Course    CRITICAL CARE Performed by: Brent General Total critical care time: 45 minutes Critical care time was exclusive of separately billable procedures and treating other patients. Critical care was necessary to treat or prevent imminent or life-threatening deterioration. Critical care was time spent personally by me on the following activities: development of treatment plan with patient and/or surrogate as well as nursing, discussions with consultants, evaluation of patient's response to treatment, examination of patient, obtaining history from patient or surrogate, ordering and performing treatments and interventions, ordering and review of laboratory studies, ordering and review of radiographic studies, pulse oximetry and re-evaluation of patient's condition.   Patient was placed on Neo-Synephrine.  She is given IV fluids and antibiotics.  Patient is more alert at this time. Final Clinical Impressions(s) / ED Diagnoses   Final diagnoses:  None    New Prescriptions New Prescriptions   No medications on file     Dalia Heading, PA-C 03/08/16 [place], MD 03/09/16 [phone]

## 2016-03-08 NOTE — ED Notes (Signed)
CBG=[id number]  

## 2016-03-09 DIAGNOSIS — N732 Unspecified parametritis and pelvic cellulitis: Secondary | ICD-10-CM

## 2016-03-09 LAB — BASIC METABOLIC PANEL
Anion gap: 7 (ref 5–15)
Anion gap: 8 (ref 5–15)
BUN: 42 mg/dL — AB (ref 6–20)
BUN: 38 mg/dL — ABNORMAL HIGH (ref 6–20)
CALCIUM: 9.2 mg/dL (ref 8.9–10.3)
CHLORIDE: 112 mmol/L — AB (ref 101–111)
CO2: 21 mmol/L — AB (ref 22–32)
CO2: 22 mmol/L (ref 22–32)
CREATININE: 1.18 mg/dL — AB (ref 0.44–1.00)
Calcium: 9 mg/dL (ref 8.9–10.3)
Chloride: 111 mmol/L (ref 101–111)
Creatinine, Ser: 1.39 mg/dL — ABNORMAL HIGH (ref 0.44–1.00)
GFR calc non Af Amer: 41 mL/min — ABNORMAL LOW (ref >60)
GFR calc non Af Amer: 50 mL/min — ABNORMAL LOW (ref >60)
GFR, EST AFRICAN AMERICAN: 48 mL/min — AB (ref >60)
GFR, EST AFRICAN AMERICAN: 58 mL/min — AB (ref >60)
GLUCOSE: 131 mg/dL — AB (ref 65–99)
Glucose, Bld: 122 mg/dL — ABNORMAL HIGH (ref 65–99)
POTASSIUM: 3.2 mmol/L — AB (ref 3.5–5.1)
Potassium: 3 mmol/L — ABNORMAL LOW (ref 3.5–5.1)
SODIUM: 140 mmol/L (ref 135–145)
Sodium: 141 mmol/L (ref 135–145)

## 2016-03-09 LAB — CBC
HEMATOCRIT: 22.9 % — AB (ref 36.0–46.0)
Hemoglobin: 7.6 g/dL — ABNORMAL LOW (ref 12.0–15.0)
MCH: 29.1 pg (ref 26.0–34.0)
MCHC: 33.2 g/dL (ref 30.0–36.0)
MCV: 87.7 fL (ref 78.0–100.0)
Platelets: 317 10*3/uL (ref 150–400)
RBC: 2.61 MIL/uL — ABNORMAL LOW (ref 3.87–5.11)
RDW: 15.4 % (ref 11.5–15.5)
WBC: 17.6 10*3/uL — ABNORMAL HIGH (ref 4.0–10.5)

## 2016-03-09 LAB — GLUCOSE, CAPILLARY
GLUCOSE-CAPILLARY: 114 mg/dL — AB (ref 65–99)
GLUCOSE-CAPILLARY: 81 mg/dL (ref 65–99)
Glucose-Capillary: 108 mg/dL — ABNORMAL HIGH (ref 65–99)
Glucose-Capillary: 71 mg/dL (ref 65–99)
Glucose-Capillary: 89 mg/dL (ref 65–99)
Glucose-Capillary: 97 mg/dL (ref 65–99)
Glucose-Capillary: 98 mg/dL (ref 65–99)

## 2016-03-09 LAB — HEPARIN LEVEL (UNFRACTIONATED)
HEPARIN UNFRACTIONATED: 0.7 [IU]/mL (ref 0.30–0.70)
Heparin Unfractionated: 0.18 IU/mL — ABNORMAL LOW (ref 0.30–0.70)

## 2016-03-09 LAB — APTT
APTT: 47 s — AB (ref 24–36)
aPTT: 51 seconds — ABNORMAL HIGH (ref 24–36)

## 2016-03-09 LAB — PROCALCITONIN: Procalcitonin: 51.31 ng/mL

## 2016-03-09 LAB — MAGNESIUM: Magnesium: 1.8 mg/dL (ref 1.7–2.4)

## 2016-03-09 LAB — PHOSPHORUS: Phosphorus: 2.8 mg/dL (ref 2.5–4.6)

## 2016-03-09 LAB — LACTIC ACID, PLASMA: LACTIC ACID, VENOUS: 1.2 mmol/L (ref 0.5–1.9)

## 2016-03-09 LAB — TROPONIN I

## 2016-03-09 MED ORDER — SODIUM CHLORIDE 0.9 % IV BOLUS (SEPSIS)
750.0000 mL | Freq: Once | INTRAVENOUS | Status: AC
  Administered 2016-03-09: 750 mL via INTRAVENOUS

## 2016-03-09 MED ORDER — SODIUM CHLORIDE 0.9% FLUSH
10.0000 mL | INTRAVENOUS | Status: DC | PRN
  Administered 2016-03-13 – 2016-03-14 (×4): 20 mL
  Administered 2016-03-15 – 2016-03-19 (×4): 10 mL
  Filled 2016-03-09 (×8): qty 40

## 2016-03-09 MED ORDER — BOOST / RESOURCE BREEZE PO LIQD
1.0000 | Freq: Two times a day (BID) | ORAL | Status: DC
Start: 2016-03-09 — End: 2016-03-20
  Administered 2016-03-09 – 2016-03-20 (×16): 1 via ORAL

## 2016-03-09 MED ORDER — POTASSIUM CHLORIDE 10 MEQ/50ML IV SOLN
10.0000 meq | INTRAVENOUS | Status: AC
  Administered 2016-03-09 (×4): 10 meq via INTRAVENOUS
  Filled 2016-03-09 (×4): qty 50

## 2016-03-09 MED ORDER — SODIUM CHLORIDE 0.9 % IV BOLUS (SEPSIS)
1000.0000 mL | Freq: Once | INTRAVENOUS | Status: AC
  Administered 2016-03-09: 11:00:00 via INTRAVENOUS
  Administered 2016-03-09: 1000 mL via INTRAVENOUS

## 2016-03-09 MED ORDER — VANCOMYCIN HCL 500 MG IV SOLR
500.0000 mg | Freq: Two times a day (BID) | INTRAVENOUS | Status: AC
  Administered 2016-03-09 – 2016-03-12 (×7): 500 mg via INTRAVENOUS
  Filled 2016-03-09 (×7): qty 500

## 2016-03-09 MED ORDER — PIPERACILLIN-TAZOBACTAM 3.375 G IVPB
3.3750 g | Freq: Three times a day (TID) | INTRAVENOUS | Status: DC
  Administered 2016-03-09 – 2016-03-17 (×25): 3.375 g via INTRAVENOUS
  Filled 2016-03-09 (×26): qty 50

## 2016-03-09 MED ORDER — HEPARIN (PORCINE) IN NACL 100-0.45 UNIT/ML-% IJ SOLN
1300.0000 [IU]/h | INTRAMUSCULAR | Status: DC
  Administered 2016-03-10: 900 [IU]/h via INTRAVENOUS
  Administered 2016-03-10: 750 [IU]/h via INTRAVENOUS
  Administered 2016-03-11: 1300 [IU]/h via INTRAVENOUS
  Filled 2016-03-09 (×4): qty 250

## 2016-03-09 MED ORDER — PHENYLEPHRINE HCL 10 MG/ML IJ SOLN
30.0000 ug/min | INTRAVENOUS | Status: DC
  Administered 2016-03-09: 90 ug/min via INTRAVENOUS
  Administered 2016-03-09: 50 ug/min via INTRAVENOUS
  Administered 2016-03-10: 60 ug/min via INTRAVENOUS
  Filled 2016-03-09 (×5): qty 4

## 2016-03-09 MED ORDER — MAGNESIUM SULFATE 2 GM/50ML IV SOLN
2.0000 g | Freq: Once | INTRAVENOUS | Status: AC
  Administered 2016-03-09: 2 g via INTRAVENOUS
  Filled 2016-03-09: qty 50

## 2016-03-09 NOTE — Consult Note (Signed)
Urology Consult  Referring physician: Fatima Blank Reason for referral: Possible abscess/UTI  Chief Complaint: Possibe abscess/UTI  History of Present Illness: Patient with medical comorbidities; patient of Dr Diona Fanti with known left perc. Tube and significant left renal stone burden and left ureter stone; last saw Dr. Keturah Barre Sept 8th: The perc tube was placed initially mid July and last visit he ordered for it to be changed- done on Sept 12th; has history of pos c/s UTI; he recommended eventual perc removal of renal and ureteral stones; ? Scheduled for surgery Oct 2nd  Patient has MS and recently treated for urosepsis; has a foley catheter as well; admitted with altered mental status and now improving clinically; ? Source UTI vs. Sacral ulcers; aspiration precautions;   Recent labs: Cr improved from 2.57 to 1.18; WBC 17.6  CT scan: large left renal stone burden noted; stone unchanged in distal left ureter; perc tube in place; diffuse bladder wall thickening; approx 4 x 2x 3 cm fluid collection superior to bladder and contiguous with other fluid areas anterior to cecum; inflammation of bowel noted with fluid in gutter; worsening inflammatory changes in pelvis and right lower quadrant ?? Secondary to a UTI  Negative blood c/s; urine c/s pending  Catheter is draining large volume clear urine; perc tube site wet today and then stopped draining but draining again now  Modifying factors: There are no other modifying factors  Associated signs and symptoms: There are no other associated signs and symptoms Aggravating and relieving factors: There are no other aggravating or relieving factors Severity: Moderate Duration: Persistent  Past Medical History:  Diagnosis Date  . Acute renal failure superimposed on stage 3 chronic kidney disease ([place]) 07/21/2015  . Anemia    chronic   . Diabetes mellitus without complication ([place])   . Diabetic neuropathy ([place])   . DVT (deep venous thrombosis) ([place])   . DVT  (deep venous thrombosis) ([place])   . GERD (gastroesophageal reflux disease)   . Hypertension   . Left nephrolithiasis   . Leukocytosis   . Lymph edema    chronic   . Malnutrition of moderate degree 07/21/2015  . Multiple sclerosis diagnosed 2002-not on Therapy any longer 06/26/2012  . Muscle weakness (generalized)   . Neuromuscular disorder ([place])   . Neuromuscular dysfunction of bladder   . Polyneuropathy ([place])   . Presence of indwelling urinary catheter   . Protein calorie malnutrition ([place])   . Stage 4 decubitus ulcer ([place]) 07/05/2015  . Stroke ([place])   . UTI (lower urinary tract infection)    Past Surgical History:  Procedure Laterality Date  . ESOPHAGOGASTRODUODENOSCOPY Left 01/02/2015   Procedure: ESOPHAGOGASTRODUODENOSCOPY (EGD);  Surgeon: Arta Silence, MD;  Location: [facility];  Service: Endoscopy;  Laterality: Left;  . HERNIA MESH REMOVAL    . IR GENERIC HISTORICAL  02/23/2016   IR NEPHROSTOMY EXCHANGE LEFT 02/23/2016 Sandi Mariscal, MD [facility]  . NEPHROSTOMY TUBE PLACEMENT ([place] [facility]X)    . TUBAL LIGATION    . tubes tided      Medications: I have reviewed the patient's current medications. Allergies: No Known Allergies  Family History  Problem Relation Age of Onset  . Diabetes Mother   . Alzheimer's disease Mother   . Diabetes Father   . Diabetes Sister   . Scoliosis Sister    Social History:  reports that she quit smoking about 15 months ago. Her smoking use included Cigarettes. She has a 20.00 pack-year smoking history. She has never used smokeless tobacco.  She reports that she drinks about 3.0 oz of alcohol per week . She reports that she does not use drugs.  ROS: All systems are reviewed and negative except as noted. Rest negative  Physical Exam:  Vital signs in last 24 hours: Temp:  [97.6 F (36.4 C)-98.9 F (37.2 C)] 97.6 F (36.4 C) (09/27 1200) Pulse Rate:  [87-110] 104 (09/27 1500) Resp:  [17-30] 18 (09/27 1500) BP: (80-118)/(28-69) 82/47 (09/27  1500) SpO2:  [95 %-100 %] 100 % (09/27 1500) Weight:  [62.3 kg (137 lb 5.6 oz)-64.1 kg (141 lb 5 oz)] 64.1 kg (141 lb 5 oz) (09/27 0100)  Cardiovascular: Skin warm; not flushed Respiratory: Breaths quiet; no shortness of breath Abdomen: No masses Neurological: Normal sensation to touch Musculoskeletal: Normal motor function arms and legs Lymphatics: No inguinal adenopathy Skin: No rashes Genitourinary:belly bit distended and tender; no palpable bladder/foley good position; clear urinein perc tube bag  Laboratory Data:  Results for orders placed or performed during the hospital encounter of 03/08/16 (from the past 72 hour(s))  CBG monitoring, ED     Status: Abnormal   Collection Time: 03/08/16 12:14 PM  Result Value Ref Range   Glucose-Capillary 208 (H) 65 - 99 mg/dL  Comprehensive metabolic panel     Status: Abnormal   Collection Time: 03/08/16 12:40 PM  Result Value Ref Range   Sodium 139 135 - 145 mmol/L   Potassium 5.5 (H) 3.5 - 5.1 mmol/L   Chloride 105 101 - 111 mmol/L   CO2 22 22 - 32 mmol/L   Glucose, Bld 212 (H) 65 - 99 mg/dL   BUN 59 (H) 6 - 20 mg/dL   Creatinine, Ser 2.57 (H) 0.44 - 1.00 mg/dL   Calcium 10.3 8.9 - 10.3 mg/dL   Total Protein 7.7 6.5 - 8.1 g/dL   Albumin 3.2 (L) 3.5 - 5.0 g/dL   AST 21 15 - 41 U/L   ALT 13 (L) 14 - 54 U/L   Alkaline Phosphatase 40 38 - 126 U/L   Total Bilirubin 0.6 0.3 - 1.2 mg/dL   GFR calc non Af Amer 20 (L) >60 mL/min   GFR calc Af Amer 23 (L) >60 mL/min    Comment: (NOTE) The eGFR has been calculated using the CKD EPI equation. This calculation has not been validated in all clinical situations. eGFR's persistently <60 mL/min signify possible Chronic Kidney Disease.    Anion gap 12 5 - 15  Blood Culture (routine x 2)     Status: None (Preliminary result)   Collection Time: 03/08/16 12:40 PM  Result Value Ref Range   Specimen Description BLOOD RIGHT ARM    Special Requests BOTTLES DRAWN AEROBIC AND ANAEROBIC 5CC    Culture       NO GROWTH < 24 HOURS Performed at [facility]    Report Status PENDING   Urinalysis, Routine w reflex microscopic (not at [facility])     Status: Abnormal   Collection Time: 03/08/16 12:40 PM  Result Value Ref Range   Color, Urine YELLOW YELLOW   APPearance CLOUDY (A) CLEAR   Specific Gravity, Urine 1.010 1.005 - 1.030   pH 7.0 5.0 - 8.0   Glucose, UA NEGATIVE NEGATIVE mg/dL   Hgb urine dipstick LARGE (A) NEGATIVE   Bilirubin Urine NEGATIVE NEGATIVE   Ketones, ur NEGATIVE NEGATIVE mg/dL   Protein, ur NEGATIVE NEGATIVE mg/dL   Nitrite NEGATIVE NEGATIVE   Leukocytes, UA LARGE (A) NEGATIVE  Urine microscopic-add on  Status: Abnormal   Collection Time: 03/08/16 12:40 PM  Result Value Ref Range   Squamous Epithelial / LPF 0-5 (A) NONE SEEN   WBC, UA TOO NUMEROUS TO COUNT 0 - 5 WBC/hpf   RBC / HPF 6-30 0 - 5 RBC/hpf   Bacteria, UA MANY (A) NONE SEEN  CBC     Status: Abnormal   Collection Time: 03/08/16  2:11 PM  Result Value Ref Range   WBC 18.4 (H) 4.0 - 10.5 K/uL   RBC 3.67 (L) 3.87 - 5.11 MIL/uL   Hemoglobin 10.7 (L) 12.0 - 15.0 g/dL   HCT 33.7 (L) 36.0 - 46.0 %   MCV 91.8 78.0 - 100.0 fL   MCH 29.2 26.0 - 34.0 pg   MCHC 31.8 30.0 - 36.0 g/dL   RDW 15.7 (H) 11.5 - 15.5 %   Platelets 382 150 - 400 K/uL  Blood Culture (routine x 2)     Status: None (Preliminary result)   Collection Time: 03/08/16  2:12 PM  Result Value Ref Range   Specimen Description BLOOD RIGHT ANTECUBITAL    Special Requests BOTTLES DRAWN AEROBIC ONLY 2ML    Culture      NO GROWTH < 24 HOURS Performed at [facility]    Report Status PENDING   Troponin I     Status: Abnormal   Collection Time: 03/08/16  3:00 PM  Result Value Ref Range   Troponin I 0.03 (HH) <0.03 ng/mL    Comment: CRITICAL RESULT CALLED TO, READ BACK BY AND VERIFIED WITH: M HAMBY AT 1716 ON 09.26.2017 BY NBROOKS   Procalcitonin - Baseline     Status: None   Collection Time: 03/08/16  3:00 PM  Result Value Ref  Range   Procalcitonin 61.85 ng/mL    Comment:        Interpretation: PCT >= 10 ng/mL: Important systemic inflammatory response, almost exclusively due to severe bacterial sepsis or septic shock. (NOTE)         ICU PCT Algorithm               Non ICU PCT Algorithm    ----------------------------     ------------------------------         PCT < 0.25 ng/mL                 PCT < 0.1 ng/mL     Stopping of antibiotics            Stopping of antibiotics       strongly encouraged.               strongly encouraged.    ----------------------------     ------------------------------       PCT level decrease by               PCT < 0.25 ng/mL       >= 80% from peak PCT       OR PCT 0.25 - 0.5 ng/mL          Stopping of antibiotics                                             encouraged.     Stopping of antibiotics           encouraged.    ----------------------------     ------------------------------  PCT level decrease by              PCT >= 0.25 ng/mL       < 80% from peak PCT        AND PCT >= 0.5 ng/mL             Continuing antibiotics                                              encouraged.       Continuing antibiotics            encouraged.    ----------------------------     ------------------------------     PCT level increase compared          PCT > 0.5 ng/mL         with peak PCT AND          PCT >= 0.5 ng/mL             Escalation of antibiotics                                          strongly encouraged.      Escalation of antibiotics        strongly encouraged.   I-Stat CG4 Lactic Acid, ED  (not at  [facility])     Status: Abnormal   Collection Time: 03/08/16  3:18 PM  Result Value Ref Range   Lactic Acid, Venous 5.56 (HH) 0.5 - 1.9 mmol/L   Comment NOTIFIED PHYSICIAN   POC CBG, ED     Status: Abnormal   Collection Time: 03/08/16  3:51 PM  Result Value Ref Range   Glucose-Capillary 159 (H) 65 - 99 mg/dL  MRSA PCR Screening     Status: None   Collection Time: 03/08/16   6:16 PM  Result Value Ref Range   MRSA by PCR NEGATIVE NEGATIVE    Comment:        The GeneXpert MRSA Assay (FDA approved for NASAL specimens only), is one component of a comprehensive MRSA colonization surveillance program. It is not intended to diagnose MRSA infection nor to guide or monitor treatment for MRSA infections.   Glucose, capillary     Status: Abnormal   Collection Time: 03/08/16  7:48 PM  Result Value Ref Range   Glucose-Capillary 141 (H) 65 - 99 mg/dL   Comment 1 Notify RN    Comment 2 Document in Chart   Lactic acid, plasma     Status: None   Collection Time: 03/08/16  9:18 PM  Result Value Ref Range   Lactic Acid, Venous 1.6 0.5 - 1.9 mmol/L  Heparin level (unfractionated)     Status: Abnormal   Collection Time: 03/08/16  9:18 PM  Result Value Ref Range   Heparin Unfractionated 0.72 (H) 0.30 - 0.70 IU/mL    Comment:        IF HEPARIN RESULTS ARE BELOW EXPECTED VALUES, AND PATIENT DOSAGE HAS BEEN CONFIRMED, SUGGEST FOLLOW UP TESTING OF ANTITHROMBIN III LEVELS.   Troponin I     Status: None   Collection Time: 03/08/16  9:19 PM  Result Value Ref Range   Troponin I <0.03 <0.03 ng/mL  APTT     Status: Abnormal   Collection Time:  03/08/16  9:19 PM  Result Value Ref Range   aPTT 45 (H) 24 - 36 seconds    Comment:        IF BASELINE aPTT IS ELEVATED, SUGGEST PATIENT RISK ASSESSMENT BE USED TO DETERMINE APPROPRIATE ANTICOAGULANT THERAPY.   Protime-INR     Status: Abnormal   Collection Time: 03/08/16  9:19 PM  Result Value Ref Range   Prothrombin Time 20.0 (H) 11.4 - 15.2 seconds   INR 5.27   Basic metabolic panel     Status: Abnormal   Collection Time: 03/08/16 11:04 PM  Result Value Ref Range   Sodium 140 135 - 145 mmol/L   Potassium 3.2 (L) 3.5 - 5.1 mmol/L    Comment: RESULT REPEATED AND VERIFIED DELTA CHECK NOTED    Chloride 112 (H) 101 - 111 mmol/L   CO2 21 (L) 22 - 32 mmol/L   Glucose, Bld 122 (H) 65 - 99 mg/dL   BUN 42 (H) 6 - 20 mg/dL    Creatinine, Ser 1.39 (H) 0.44 - 1.00 mg/dL    Comment: RESULT REPEATED AND VERIFIED DELTA CHECK NOTED    Calcium 9.0 8.9 - 10.3 mg/dL   GFR calc non Af Amer 41 (L) >60 mL/min   GFR calc Af Amer 48 (L) >60 mL/min    Comment: (NOTE) The eGFR has been calculated using the CKD EPI equation. This calculation has not been validated in all clinical situations. eGFR's persistently <60 mL/min signify possible Chronic Kidney Disease.    Anion gap 7 5 - 15  Glucose, capillary     Status: Abnormal   Collection Time: 03/09/16 12:11 AM  Result Value Ref Range   Glucose-Capillary 108 (H) 65 - 99 mg/dL   Comment 1 Notify RN    Comment 2 Document in Chart   Lactic acid, plasma     Status: None   Collection Time: 03/09/16  1:05 AM  Result Value Ref Range   Lactic Acid, Venous 1.2 0.5 - 1.9 mmol/L  Troponin I     Status: None   Collection Time: 03/09/16  3:05 AM  Result Value Ref Range   Troponin I <0.03 <0.03 ng/mL  Procalcitonin     Status: None   Collection Time: 03/09/16  3:05 AM  Result Value Ref Range   Procalcitonin 51.31 ng/mL    Comment:        Interpretation: PCT >= 10 ng/mL: Important systemic inflammatory response, almost exclusively due to severe bacterial sepsis or septic shock. (NOTE)         ICU PCT Algorithm               Non ICU PCT Algorithm    ----------------------------     ------------------------------         PCT < 0.25 ng/mL                 PCT < 0.1 ng/mL     Stopping of antibiotics            Stopping of antibiotics       strongly encouraged.               strongly encouraged.    ----------------------------     ------------------------------       PCT level decrease by               PCT < 0.25 ng/mL       >= 80% from peak PCT       OR PCT  0.25 - 0.5 ng/mL          Stopping of antibiotics                                             encouraged.     Stopping of antibiotics           encouraged.    ----------------------------      ------------------------------       PCT level decrease by              PCT >= 0.25 ng/mL       < 80% from peak PCT        AND PCT >= 0.5 ng/mL             Continuing antibiotics                                              encouraged.       Continuing antibiotics            encouraged.    ----------------------------     ------------------------------     PCT level increase compared          PCT > 0.5 ng/mL         with peak PCT AND          PCT >= 0.5 ng/mL             Escalation of antibiotics                                          strongly encouraged.      Escalation of antibiotics        strongly encouraged.   CBC     Status: Abnormal   Collection Time: 03/09/16  3:05 AM  Result Value Ref Range   WBC 17.6 (H) 4.0 - 10.5 K/uL   RBC 2.61 (L) 3.87 - 5.11 MIL/uL   Hemoglobin 7.6 (L) 12.0 - 15.0 g/dL    Comment: DELTA CHECK NOTED REPEATED TO VERIFY    HCT 22.9 (L) 36.0 - 46.0 %   MCV 87.7 78.0 - 100.0 fL   MCH 29.1 26.0 - 34.0 pg   MCHC 33.2 30.0 - 36.0 g/dL   RDW 15.4 11.5 - 15.5 %   Platelets 317 150 - 400 K/uL  Basic metabolic panel     Status: Abnormal   Collection Time: 03/09/16  3:05 AM  Result Value Ref Range   Sodium 141 135 - 145 mmol/L   Potassium 3.0 (L) 3.5 - 5.1 mmol/L   Chloride 111 101 - 111 mmol/L   CO2 22 22 - 32 mmol/L   Glucose, Bld 131 (H) 65 - 99 mg/dL   BUN 38 (H) 6 - 20 mg/dL   Creatinine, Ser 1.18 (H) 0.44 - 1.00 mg/dL   Calcium 9.2 8.9 - 10.3 mg/dL   GFR calc non Af Amer 50 (L) >60 mL/min   GFR calc Af Amer 58 (L) >60 mL/min    Comment: (NOTE) The eGFR has been calculated using the CKD EPI equation. This calculation has not been validated in all clinical situations. eGFR's persistently <60  mL/min signify possible Chronic Kidney Disease.    Anion gap 8 5 - 15  Magnesium     Status: None   Collection Time: 03/09/16  3:05 AM  Result Value Ref Range   Magnesium 1.8 1.7 - 2.4 mg/dL  Phosphorus     Status: None   Collection Time: 03/09/16   3:05 AM  Result Value Ref Range   Phosphorus 2.8 2.5 - 4.6 mg/dL  Glucose, capillary     Status: Abnormal   Collection Time: 03/09/16  3:27 AM  Result Value Ref Range   Glucose-Capillary 114 (H) 65 - 99 mg/dL   Comment 1 Notify RN    Comment 2 Document in Chart   APTT     Status: Abnormal   Collection Time: 03/09/16  7:20 AM  Result Value Ref Range   aPTT 47 (H) 24 - 36 seconds    Comment:        IF BASELINE aPTT IS ELEVATED, SUGGEST PATIENT RISK ASSESSMENT BE USED TO DETERMINE APPROPRIATE ANTICOAGULANT THERAPY.   Heparin level (unfractionated)     Status: None   Collection Time: 03/09/16  7:20 AM  Result Value Ref Range   Heparin Unfractionated 0.70 0.30 - 0.70 IU/mL    Comment:        IF HEPARIN RESULTS ARE BELOW EXPECTED VALUES, AND PATIENT DOSAGE HAS BEEN CONFIRMED, SUGGEST FOLLOW UP TESTING OF ANTITHROMBIN III LEVELS.   Glucose, capillary     Status: None   Collection Time: 03/09/16  7:54 AM  Result Value Ref Range   Glucose-Capillary 98 65 - 99 mg/dL  Glucose, capillary     Status: None   Collection Time: 03/09/16 12:05 PM  Result Value Ref Range   Glucose-Capillary 81 65 - 99 mg/dL  Heparin level (unfractionated)     Status: Abnormal   Collection Time: 03/09/16  2:01 PM  Result Value Ref Range   Heparin Unfractionated 0.18 (L) 0.30 - 0.70 IU/mL    Comment:        IF HEPARIN RESULTS ARE BELOW EXPECTED VALUES, AND PATIENT DOSAGE HAS BEEN CONFIRMED, SUGGEST FOLLOW UP TESTING OF ANTITHROMBIN III LEVELS.   APTT     Status: Abnormal   Collection Time: 03/09/16  2:01 PM  Result Value Ref Range   aPTT 51 (H) 24 - 36 seconds    Comment:        IF BASELINE aPTT IS ELEVATED, SUGGEST PATIENT RISK ASSESSMENT BE USED TO DETERMINE APPROPRIATE ANTICOAGULANT THERAPY.    Recent Results (from the past 240 hour(s))  Blood Culture (routine x 2)     Status: None (Preliminary result)   Collection Time: 03/08/16 12:40 PM  Result Value Ref Range Status   Specimen  Description BLOOD RIGHT ARM  Final   Special Requests BOTTLES DRAWN AEROBIC AND ANAEROBIC 5CC  Final   Culture   Final    NO GROWTH < 24 HOURS Performed at [facility]    Report Status PENDING  Incomplete  Blood Culture (routine x 2)     Status: None (Preliminary result)   Collection Time: 03/08/16  2:12 PM  Result Value Ref Range Status   Specimen Description BLOOD RIGHT ANTECUBITAL  Final   Special Requests BOTTLES DRAWN AEROBIC ONLY 2ML  Final   Culture   Final    NO GROWTH < 24 HOURS Performed at [facility]    Report Status PENDING  Incomplete  MRSA PCR Screening     Status: None   Collection Time:  03/08/16  6:16 PM  Result Value Ref Range Status   MRSA by PCR NEGATIVE NEGATIVE Final    Comment:        The GeneXpert MRSA Assay (FDA approved for NASAL specimens only), is one component of a comprehensive MRSA colonization surveillance program. It is not intended to diagnose MRSA infection nor to guide or monitor treatment for MRSA infections.    Creatinine:  Recent Labs  03/08/16 1240 03/08/16 2304 03/09/16 0305  CREATININE 2.57* 1.39* 1.18*    Xrays: See report/chart See above  Impression/Assessment:  Possible Urosepsis with stones as noted; surgery on Oct 2 by Dr D,. May need to be delayed; continue to treat for possible UTI/sepsis- in my opinion the CT results with inflammed bowel and fluid/possible abcess is not from a UTI- this would be rare; and is probably from another source; no evidence of urine extravasation; from a urologic standpoint nothing to drain or intervene at this stage  If perc tube stops draining have it checked by interventional radiology Thanks  Plan:  Continue with iv meds/fluids etc Will continue to follow and make comment on ongoing care/planned surgery  Annarae Macnair A 03/09/2016, 3:48 PM

## 2016-03-09 NOTE — Progress Notes (Signed)
Initial Nutrition Assessment  DOCUMENTATION CODES:   Not applicable  INTERVENTION:  - Will order Boost Breeze BID, each supplement provides 250 kcal and 9 grams of protein - Encourage PO intakes of meals and supplements. - RD will continue to monitor for additional nutrition-related needs.  NUTRITION DIAGNOSIS:   Inadequate protein intake related to other (see comment) (current diet order) as evidenced by other (see comment) (CLD does not meet estimated protein need).  GOAL:   Patient will meet greater than or equal to 90% of their needs  MONITOR:   PO intake, Supplement acceptance, Diet advancement, Weight trends, Labs, I & O's  REASON FOR ASSESSMENT:   Low Braden  ASSESSMENT:   55 y/o female with PMH of Multiple Sclerosis (dx 2002), GERD, malnutrition, DM with neuropathy, CKD III, DVT, chronic indwelling foley, stage 4 decubitus ulcer, anemia, HTN and lives at a SNF who presented to [facility] on 9/26 with altered mental status. Per report, she was found at [facility] with altered mental status/lethargic. She was last seen normal the evening prior.  EMS reported dried vomit on the patient when the picked her up.  Glucose was WNL. She was noted to have drainage from her decubitus wounds.  She was found to be febrile, tachycardic, and hypotensive. Sepsis therapy activated with fluid resuscitation and empiric antibiotics. CXR was negative for acute infectious process.  Pt seen for low Braden. BMI indicates normal weight. Pt advanced from NPO to CLD today at 74. Per chart review, pt consumed 100% of lunch today (tech reports beef broth, [place] ice, a little bit of jello, and a beverage). No family/visitors present at this time. Notes indicate pt with acute encephalopathy; pt able to provide reliable information to simple questions asked by RD. Pt recalls consuming lunch and states that she is having some slight abdominal pain (R-sided) and no nausea.   Unable to perform physical  assessment at this time per pt request; will attempt at follow-up. Per chart review, weight stable since July. Will continue to monitor weight trends during hospitalization. Notes indicate pt with an ulcer but no wounds documented in flow sheet. Will continue to monitor and will update estimated nutrition needs as warranted.  Notes indicate pt is bedbound at baseline.   Medications reviewed; sliding scale Novolog, 2 g IV Mg sulfate x1 dose today, 10 mEq IV KCl x4 runs today. Labs reviewed; CBGs: 81-114 mg/dL since midnight, K: 3 mmol/L, BUN: 38 mg/dL, creatinine: 1.18 mg/dL, GFR: 58 mL/min.  IVF: NS @ 100 mL/hr.    Diet Order:  Diet clear liquid Room service appropriate? Yes; Fluid consistency: Thin  Skin:  Reviewed, no issues  Last BM:  PTA  Height:   Ht Readings from Last 1 Encounters:  03/08/16 5\' 5"  (1.651 m)    Weight:   Wt Readings from Last 1 Encounters:  03/09/16 141 lb 5 oz (64.1 kg)    Ideal Body Weight:  56.82 kg  BMI:  Body mass index is 23.52 kg/m.  Estimated Nutritional Needs:   Kcal:  1300-1500  Protein:  55-65 grams  Fluid:  >/= 1.5 L/day  EDUCATION NEEDS:   No education needs identified at this time    Jarome Matin, MS, RD, LDN Inpatient Clinical Dietitian Pager # [phone] After hours/weekend pager # [phone]

## 2016-03-09 NOTE — Progress Notes (Signed)
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)  No Known Allergies  Patient Measurements: Height: 5\' 5"  (165.1 cm) Weight: 141 lb 5 oz (64.1 kg) IBW/kg (Calculated) : 57 Heparin Dosing Wt: 43kg  Vital Signs: Temp: 98.3 F (36.8 C) (09/27 0800) Temp Source: Oral (09/27 0800) BP: 101/45 (09/27 0830) Pulse Rate: 87 (09/27 0800)  Labs:  Recent Labs  03/08/16 1240 03/08/16 1411 03/08/16 1500 03/08/16 2118 03/08/16 2119 03/08/16 2304 03/09/16 0305 03/09/16 0720  HGB  --  10.7*  --   --   --   --  7.6*  --   HCT  --  33.7*  --   --   --   --  22.9*  --   PLT  --  382  --   --   --   --  317  --   APTT  --   --   --   --  45*  --   --  47*  LABPROT  --   --   --   --  20.0*  --   --   --   INR  --   --   --   --  1.68  --   --   --   HEPARINUNFRC  --   --   --  0.72*  --   --   --  0.70  CREATININE 2.57*  --   --   --   --  1.39* 1.18*  --   TROPONINI  --   --  0.03*  --  <0.03  --  <0.03  --     Estimated Creatinine Clearance: 47.3 mL/min (by C-G formula based on SCr of 1.18 mg/dL (H)).   Medical History: Past Medical History:  Diagnosis Date  . Acute renal failure superimposed on stage 3 chronic kidney disease ([place]) 07/21/2015  . Anemia    chronic   . Diabetes mellitus without complication ([place])   . Diabetic neuropathy ([place])   . DVT (deep venous thrombosis) ([place])   . DVT (deep venous thrombosis) ([place])   . GERD (gastroesophageal reflux disease)   . Hypertension   . Left nephrolithiasis   . Leukocytosis   . Lymph edema    chronic   . Malnutrition of moderate degree 07/21/2015  . Multiple sclerosis diagnosed 2002-not on Therapy any longer 06/26/2012  . Muscle weakness (generalized)   . Neuromuscular disorder ([place])   . Neuromuscular dysfunction of bladder   . Polyneuropathy ([place])   . Presence of indwelling urinary catheter   . Protein calorie malnutrition ([place])   . Stage 4 decubitus ulcer  ([place]) 07/05/2015  . Stroke ([place])   . UTI (lower urinary tract infection)     Medications:   Xarelto 20mg  daily PTA- LD 9/25 @ 1700 Infusions:  . sodium chloride 100 mL/hr at 03/09/16 0600  . heparin 550 Units/hr (03/09/16 0600)  . phenylephrine (NEO-SYNEPHRINE) Adult infusion 50 mcg/min (03/09/16 [id number])    Assessment: 55 yo F on chronic Xarelto for hx VTE which is being held due to acute illness/ renal failure (est CrCl <27ml/min).   Today, 03/09/2016:  - heparin level 0.70, aPTT 47 (heparin level not correlating with aPTT d/t effect of residual xarelto) - hgb down 7.6, plt ok - per RN, no bleeding noted - renal funct has improved, scr down 1.18 (crcl~47)   Goal of Therapy:  Heparin level 0.3-0.7 units/ml; aPTT 66-102sec  until correlates with heparin level Monitor platelets by anticoagulation protocol: Yes   Plan:   Increase drip heparin infusion to 650 units/hr  Check 8hr heparin level & aPTT  Daily heparin level & CBC while on heparin; Daily aPTT until heparin level correlates with aPTT  Monitor for s/s bleeding   Patient's currently on vancomycin, zosyn and anidulafungin for r/o sepsis.  Cultures have all been negative thus far.  PCT and LA elevated  With improving renal function, will adjust zosyn to 3.375 gm IV q8h (infuse over 4 hours), vancomycin to 500 mg IV q12h, continue anidulafungin 100 mg daily.  Monitor renal function closely   Sylvestre Rathgeber P 03/09/2016,8:55 AM

## 2016-03-09 NOTE — Progress Notes (Signed)
PULMONARY / CRITICAL CARE MEDICINE   Name: [name] MRN: [id number] DOB: 11-11-1960    ADMISSION DATE:  03/08/2016 CONSULTATION DATE:  03/08/16  REFERRING MD:  Dr. Tomi Bamberger  CHIEF COMPLAINT:  AMS   HISTORY OF PRESENT ILLNESS:   55 y/o F with PMH of MultipleSclerosis (dx 2002), GERD, malnutrition, DM with neuropathy, CKD III (baseline sr cr ~ 0.5), DVT (**), chronic indwelling foley, stage 4 decubitus ulcer, anemia, HTN and lives at a SNF who presented to [facility] on 9/26 with altered mental status.   Per report, she was found at [facility] with altered mental status / lethargic.  She was last seen normal the evening prior.  EMS reported dried vomit on the patient when the picked her up.  Glucose was wnl. Initial ER evaluation notable for sr cr 2.57 (up from 0.51 01/2016),lactate 5.56, WBC 18.4.  She was noted to have drainage from her decubitus wounds.  She was found to be febrile to 100.4, tachycardic (144) and hypotensive (78/38).  Sepsis therapy activated with fluid resuscitation and empiric antibiotics.  CT abd s/o pelvic abscess? Dome of bladder  PCCM called for evaluation.     SUBJECTIVE:  More awake Critically ill on lower neo gtt Afebrile C/o pain lower abdomen  VITAL SIGNS: BP (!) 94/48   Pulse 87   Temp 98.3 F (36.8 C) (Oral)   Resp 20   Ht 5\' 5"  (1.651 m)   Wt 141 lb 5 oz (64.1 kg)   SpO2 100%   BMI 23.52 kg/m   HEMODYNAMICS: CVP:  [2 mmHg-12 mmHg] 2 mmHg  VENTILATOR SETTINGS:    INTAKE / OUTPUT: I/O last 3 completed shifts: In: 3761.9 [I.V.:1901.9; IV Piggyback:1860] Out: 1925 [id number]  PHYSICAL EXAMINATION: General:  Chronically ill appearing,weak Neuro:  Altered but moving upper ext to command, bed ridden. HEENT:  [place]/AT, PERRL, EOM-I and MMM. Cardiovascular:  RRR, Nl S1/S2, -M/R/G. Lungs:  CTA bilaterally. Abdomen:  Soft,  Tender bilateral lower quadrant, guarding, ND and +BS. Musculoskeletal: -edema and -tenderness. Skin:   Intact.  LABS:  BMET  Recent Labs Lab 03/08/16 1240 03/08/16 2304 03/09/16 0305  NA 139 140 141  K 5.5* 3.2* 3.0*  CL 105 112* 111  CO2 22 21* 22  BUN 59* 42* 38*  CREATININE 2.57* 1.39* 1.18*  GLUCOSE 212* 122* 131*    Electrolytes  Recent Labs Lab 03/08/16 1240 03/08/16 2304 03/09/16 0305  CALCIUM 10.3 9.0 9.2  MG  --   --  1.8  PHOS  --   --  2.8    CBC  Recent Labs Lab 03/08/16 1411 03/09/16 0305  WBC 18.4* 17.6*  HGB 10.7* 7.6*  HCT 33.7* 22.9*  PLT 382 317    Coag's  Recent Labs Lab 03/08/16 2119 03/09/16 0720  APTT 45* 47*  INR 1.68  --     Sepsis Markers  Recent Labs Lab 03/08/16 1500 03/08/16 1518 03/08/16 2118 03/09/16 0105 03/09/16 0305  LATICACIDVEN  --  5.56* 1.6 1.2  --   PROCALCITON 61.85  --   --   --  51.31    ABG No results for input(s): PHART, PCO2ART, PO2ART in the last 168 hours.  Liver Enzymes  Recent Labs Lab 03/08/16 1240  AST 21  ALT 13*  ALKPHOS 40  BILITOT 0.6  ALBUMIN 3.2*    Cardiac Enzymes  Recent Labs Lab 03/08/16 1500 03/08/16 2119 03/09/16 0305  TROPONINI 0.03* <0.03 <0.03    Glucose  Recent Labs Lab 03/08/16  1214 03/08/16 1551 03/08/16 1948 03/09/16 0011 03/09/16 0327 03/09/16 0754  GLUCAP 208* 159* 141* 108* 114* 98    Imaging Ct Abdomen Pelvis Wo Contrast  Result Date: 03/08/2016 CLINICAL DATA:  55 y/o  F; altered mental status and lethargy. EXAM: CT ABDOMEN AND PELVIS WITHOUT CONTRAST TECHNIQUE: Multidetector CT imaging of the abdomen and pelvis was performed following the standard protocol without IV contrast. COMPARISON:  12/22/2015 CT abdomen and pelvis. FINDINGS: Lower chest: Minor bibasilar atelectasis. Trace right pleural effusion. Hepatobiliary: No focal liver abnormality. Gallstones. No intra or extrahepatic biliary ductal dilatation. Pancreas: Unremarkable. No pancreatic ductal dilatation or surrounding inflammatory changes. Spleen: Moderately atrophic.  Adrenals/Urinary Tract: Normal adrenal glands. Large stone in the left renal pelvis and additional small caliceal stones. Unchanged stone within the left distal ureter. Left-sided interpolar pelvic pelvic cysts. Left-sided percutaneous nephrostomy tube coiling in the pelvis. Mild right-sided stable in in name in pelvicaliectasis. No hydroureter. No right-sided obstructing stone or lesion identified. Collapsed bladder around Foley catheter. Several small diverticulum. Diffuse thickening of the bladder wall. There is a fluid collection superior to the bladder abutting a loop of ileum in the right lower quadrant measuring 38 x 19 x 27 mm (AP x ML x CC), series 3, image 79 and series 4, image 52. The collection appears contiguous with additional areas of fluid anterior to the cecum (series 3, image 77). Additionally, there is an increase in inflammatory changes, small bowel wall thickening, and thickening of the wall of the cecum with fluid tracking along the right pericolic gutter. In the right lower quadrant in comparison with the prior CT of the abdomen and pelvis. Stomach/Bowel: Wall thickening of cecum and small bowel loops in the right lower quadrant. Interval diminution in small bowel dilatation. Normal appendix. Vascular/Lymphatic: Aortic atherosclerosis. Prominent retroperitoneal lymph nodes are probably reactive. Reproductive: Stable myomatous uterus.  No adnexal mass identified. Other: No abdominal wall hernia or abnormality. No abdominopelvic ascites. Musculoskeletal: No acute or significant osseous findings. IMPRESSION: Worsening inflammatory changes in the pelvis and right lower quadrant with interval development of peritoneal fluid collections probably representing abscess/phlegmon, and worsening inflammatory wall thickening of cecum, right lower quadrant small bowel loops, and the dome of the bladder. The abscess/phlegmon appears associated with the dome of the bladder and probably is related to urinary  tract infection. Electronically Signed   By: Kristine Garbe M.D.   On: 03/08/2016 18:19   Dg Chest 1 View  Result Date: 03/08/2016 CLINICAL DATA:  55 y/o  F; central line placement. EXAM: CHEST 1 VIEW COMPARISON:  03/08/2016 chest radiograph. FINDINGS: Left central line placement with tip projecting over lower SVC. Stable cardiomediastinal silhouette within normal limits. Low lung volumes. Clear lungs. No pneumothorax or pleural effusion. No acute osseous abnormality identified. IMPRESSION: No active disease. Central venous catheter with tip projecting over lower SVC. Electronically Signed   By: Kristine Garbe M.D.   On: 03/08/2016 17:45   Dg Chest Port 1 View  Result Date: 03/08/2016 CLINICAL DATA:  Fever, abdominal pain, tachycardia EXAM: PORTABLE CHEST 1 VIEW COMPARISON:  Portable chest x-ray of 12/30/2015 FINDINGS: No active infiltrate or effusion is seen. Mediastinal and hilar contours are unremarkable. The heart is within normal limits in size. No bony abnormality is seen. IMPRESSION: No active disease. Electronically Signed   By: Ivar Drape M.D.   On: 03/08/2016 13:25     STUDIES:  CT abd/ pelvis 9/26 >> Worsening inflammatory changes in the pelvis and right lower quadrant with interval development  of peritoneal fluid collections probably representing abscess/phlegmon, and worsening inflammatory wall thickening of cecum, right lower quadrant small bowel loops, and the dome of the bladder. The abscess/phlegmon appears associated with the dome of the bladder  CULTURES: BCx2 9/26 >>  UC 9/26 >>   ANTIBIOTICS: Vanco 9/26 (empiric) >>  Zosyn 9/26 (empiric) >>  SIGNIFICANT EVENTS: 9/26  Admit from SNF with AMS, concern for sepsis.  Clear CXR.   LINES/TUBES: L IJ TLC 9/26>>>  DISCUSSION: 55 y/o F, SNF resident, with multiple sclerosis admitted on 9/26 with altered mental status and septic shock.  Potential sources of infection include pelvic abscess from UTI &  decubitus ulcers.   ASSESSMENT / PLAN:  PULMONARY A: At Risk Respiratory Failure - in setting of altered mental status Possible Aspiration Event - vomiting prior to admit P:   Aspiration precautions O2 as needed to support sats >92% Monitor CXR for development of infiltrate with hx of vomiting / possible aspiration   CARDIOVASCULAR A:  Shock - concern for sepsis +/- component of hypovolemia with vomiting Tachycardia - reactive in setting of sepsis   P:  Hemodynamic monitoring in ICU/SDU Appears dry -CVP 1 - fluid bolus Neosynephrine for MAP >65   RENAL A:   Renal calculi s/p Left PCN AKI - in setting of possible sepsis, volume depletion and chronic disease CKD III  Elevated Lactate -resolved  hypokalemia P:   Monitor BMP / UOP  Replace electrolytes as indicated  GASTROINTESTINAL A:   Nausea / Vomiting  P:   NPO  PRN zofran    HEMATOLOGIC A:   Anemia - suspect of chronic disease, no active bleeding P:  Trend CBC  Heparin drip in place of xarelto  INFECTIOUS A:   Shock, septic   Possible component of hypovolemia with vomiting.   P:   Abx as above  Follow cultures  Urology input Will need another imaging - CT vs US abdomen   ENDOCRINE A:   DM  Diabetic Neuropathy    P:   Trend CBG SSI   NEUROLOGIC A:   Acute Encephalopathy - related to  sepsis, medications, CVA P:    Continue baclofen  FAMILY  - Updates: No family bedside   - Inter-disciplinary family meet or Palliative Care meeting due by:  10/3   My cc time x 53m  Kara Mead MD. [facility]. [place] Pulmonary & Critical care Pager [phone] If no response call [phone]   03/09/2016

## 2016-03-09 NOTE — Care Management Note (Signed)
Case Management Note  Patient Details  Name: [name] MRN: [id number] Date of Birth: 05-12-1961  Subjective/Objective:                 Hypotensive requiring iv pressors   Action/Plan:  tbd based on progress   Expected Discharge Date:   (UNKNOWN)               Expected Discharge Plan:  Home/Self Care  In-House Referral:     Discharge planning Services     Post Acute Care Choice:    Choice offered to:     DME Arranged:    DME Agency:     HH Arranged:    HH Agency:     Status of Service:  In process, will continue to follow  If discussed at Long Length of Stay Meetings, dates discussed:    Additional Comments:  Will follow for any dc needs/Rhonda Davis,BSN,RN3,[phone]  Leeroy Cha, RN 03/09/2016, 9:35 AM

## 2016-03-09 NOTE — Progress Notes (Signed)
[facility] ADULT ICU REPLACEMENT PROTOCOL FOR AM LAB REPLACEMENT ONLY  The patient does apply for the [facility] Adult ICU Electrolyte Replacment Protocol based on the criteria listed below:   1. Is GFR >/= 40 ml/min? Yes.    Patient's GFR today is 50 2. Is urine output >/= 0.5 ml/kg/hr for the last 6 hours? Yes.   Patient's UOP is 3.0 ml/kg/hr 3. Is BUN < 60 mg/dL? Yes.    Patient's BUN today is 38 4. Abnormal electrolyte(s): K3.0, Mg 1.8 5. Ordered repletion with:per protocol 6. If a panic level lab has been reported, has the CCM MD in charge been notified? Yes.  .   Physician:  Levada Schilling, MD  Vear Clock 03/09/2016 6:14 AM

## 2016-03-09 NOTE — Progress Notes (Addendum)
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)  No Known Allergies  Patient Measurements: Height: 5\' 5"  (165.1 cm) Weight: 141 lb 5 oz (64.1 kg) IBW/kg (Calculated) : 57 Heparin Dosing Wt: 43kg  Vital Signs: Temp: 98.7 F (37.1 C) (09/27 1600) Temp Source: Oral (09/27 1600) BP: 82/47 (09/27 1500) Pulse Rate: 104 (09/27 1500)  Labs:  Recent Labs  03/08/16 1240 03/08/16 1411 03/08/16 1500 03/08/16 2118 03/08/16 2119 03/08/16 2304 03/09/16 0305 03/09/16 0720 03/09/16 1401  HGB  --  10.7*  --   --   --   --  7.6*  --   --   HCT  --  33.7*  --   --   --   --  22.9*  --   --   PLT  --  382  --   --   --   --  317  --   --   APTT  --   --   --   --  45*  --   --  47* 51*  LABPROT  --   --   --   --  20.0*  --   --   --   --   INR  --   --   --   --  1.68  --   --   --   --   HEPARINUNFRC  --   --   --  0.72*  --   --   --  0.70 0.18*  CREATININE 2.57*  --   --   --   --  1.39* 1.18*  --   --   TROPONINI  --   --  0.03*  --  <0.03  --  <0.03  --   --     Estimated Creatinine Clearance: 47.3 mL/min (by C-G formula based on SCr of 1.18 mg/dL (H)).   Medical History: Past Medical History:  Diagnosis Date  . Acute renal failure superimposed on stage 3 chronic kidney disease ([place]) 07/21/2015  . Anemia    chronic   . Diabetes mellitus without complication ([place])   . Diabetic neuropathy ([place])   . DVT (deep venous thrombosis) ([place])   . DVT (deep venous thrombosis) ([place])   . GERD (gastroesophageal reflux disease)   . Hypertension   . Left nephrolithiasis   . Leukocytosis   . Lymph edema    chronic   . Malnutrition of moderate degree 07/21/2015  . Multiple sclerosis diagnosed 2002-not on Therapy any longer 06/26/2012  . Muscle weakness (generalized)   . Neuromuscular disorder ([place])   . Neuromuscular dysfunction of bladder   . Polyneuropathy ([place])   . Presence of indwelling urinary catheter   . Protein  calorie malnutrition ([place])   . Stage 4 decubitus ulcer ([place]) 07/05/2015  . Stroke ([place])   . UTI (lower urinary tract infection)     Medications:   Xarelto 20mg  daily PTA- LD 9/25 @ 1700 Infusions:  . sodium chloride 100 mL/hr at 03/09/16 0600  . heparin 650 Units/hr (03/09/16 0912)  . phenylephrine (NEO-SYNEPHRINE) Adult infusion 15 mcg/min (03/09/16 1430)    Assessment: 55 yo F on chronic Xarelto for hx VTE which is being held due to acute illness/ renal failure (est CrCl <57ml/min).   Today, 03/09/2016:  - heparin level 0.70, aPTT 47 (heparin level not correlating with aPTT d/t effect of residual xarelto) - PM aPTT is 0.51, HL 0.18 - hgb  down 7.6, plt ok - per RN, no bleeding noted - renal funct has improved, scr down 1.18 (crcl~47)   Goal of Therapy:  Heparin level 0.3-0.7 units/ml; aPTT 66-102sec until correlates with heparin level Monitor platelets by anticoagulation protocol: Yes   Plan:   Increase drip heparin infusion to 750 units/hr  Daily heparin level & CBC while on heparin; Daily aPTT until heparin level correlates with aPTT  Monitor for s/s bleeding   Patient's currently on vancomycin, zosyn and anidulafungin for r/o sepsis.  Cultures have all been negative thus far.  PCT and LA elevated  With improving renal function, will adjust zosyn to 3.375 gm IV q8h (infuse over 4 hours), vancomycin to 500 mg IV q12h, continue anidulafungin 100 mg daily.  Monitor renal function closely   Royetta Asal, PharmD, BCPS Pager [phone] 03/09/2016 5:13 PM

## 2016-03-10 ENCOUNTER — Inpatient Hospital Stay (HOSPITAL_COMMUNITY): Payer: Medicare Other

## 2016-03-10 LAB — GLUCOSE, CAPILLARY
GLUCOSE-CAPILLARY: 77 mg/dL (ref 65–99)
GLUCOSE-CAPILLARY: 60 mg/dL — AB (ref 65–99)
GLUCOSE-CAPILLARY: 60 mg/dL — AB (ref 65–99)
GLUCOSE-CAPILLARY: 98 mg/dL (ref 65–99)
Glucose-Capillary: 92 mg/dL (ref 65–99)
Glucose-Capillary: 104 mg/dL — ABNORMAL HIGH (ref 65–99)
Glucose-Capillary: 60 mg/dL — ABNORMAL LOW (ref 65–99)
Glucose-Capillary: 96 mg/dL (ref 65–99)

## 2016-03-10 LAB — CBC WITH DIFFERENTIAL/PLATELET
BAND NEUTROPHILS: 4 %
BASOS ABS: 0 10*3/uL (ref 0.0–0.1)
Basophils Relative: 0 %
EOS ABS: 0.2 10*3/uL (ref 0.0–0.7)
Eosinophils Relative: 1 %
HCT: 21 % — ABNORMAL LOW (ref 36.0–46.0)
Hemoglobin: 7.2 g/dL — ABNORMAL LOW (ref 12.0–15.0)
LYMPHS PCT: 15 %
Lymphs Abs: 2.6 10*3/uL (ref 0.7–4.0)
MCH: 29.6 pg (ref 26.0–34.0)
MCHC: 34.3 g/dL (ref 30.0–36.0)
MCV: 86.4 fL (ref 78.0–100.0)
MONO ABS: 0.3 10*3/uL (ref 0.1–1.0)
MONOS PCT: 2 %
NEUTROS PCT: 78 %
Neutro Abs: 14.2 10*3/uL — ABNORMAL HIGH (ref 1.7–7.7)
PLATELETS: 263 10*3/uL (ref 150–400)
RBC: 2.43 MIL/uL — AB (ref 3.87–5.11)
RDW: 15.3 % (ref 11.5–15.5)
WBC: 17.3 10*3/uL — AB (ref 4.0–10.5)

## 2016-03-10 LAB — CBC
HCT: 20.2 % — ABNORMAL LOW (ref 36.0–46.0)
Hemoglobin: 6.7 g/dL — CL (ref 12.0–15.0)
MCH: 29.1 pg (ref 26.0–34.0)
MCHC: 33.2 g/dL (ref 30.0–36.0)
MCV: 87.8 fL (ref 78.0–100.0)
PLATELETS: 292 10*3/uL (ref 150–400)
RBC: 2.3 MIL/uL — AB (ref 3.87–5.11)
RDW: 15.7 % — AB (ref 11.5–15.5)
WBC: 16.9 10*3/uL — AB (ref 4.0–10.5)

## 2016-03-10 LAB — URINE CULTURE

## 2016-03-10 LAB — BASIC METABOLIC PANEL
Anion gap: 4 — ABNORMAL LOW (ref 5–15)
BUN: 15 mg/dL (ref 6–20)
CALCIUM: 9 mg/dL (ref 8.9–10.3)
CO2: 21 mmol/L — ABNORMAL LOW (ref 22–32)
Chloride: 115 mmol/L — ABNORMAL HIGH (ref 101–111)
Creatinine, Ser: 0.49 mg/dL (ref 0.44–1.00)
GFR calc Af Amer: >60 mL/min (ref >60)
Glucose, Bld: 81 mg/dL (ref 65–99)
POTASSIUM: 2.8 mmol/L — AB (ref 3.5–5.1)
SODIUM: 140 mmol/L (ref 135–145)

## 2016-03-10 LAB — HEPARIN LEVEL (UNFRACTIONATED)
HEPARIN UNFRACTIONATED: 0.11 [IU]/mL — AB (ref 0.30–0.70)
Heparin Unfractionated: 0.11 IU/mL — ABNORMAL LOW (ref 0.30–0.70)

## 2016-03-10 LAB — MAGNESIUM: MAGNESIUM: 1.8 mg/dL (ref 1.7–2.4)

## 2016-03-10 LAB — APTT: aPTT: 39 seconds — ABNORMAL HIGH (ref 24–36)

## 2016-03-10 LAB — PREPARE RBC (CROSSMATCH)

## 2016-03-10 LAB — PROCALCITONIN: Procalcitonin: 25.07 ng/mL

## 2016-03-10 MED ORDER — ACETAMINOPHEN 325 MG PO TABS
650.0000 mg | ORAL_TABLET | Freq: Four times a day (QID) | ORAL | Status: DC | PRN
  Administered 2016-03-10: 650 mg via ORAL
  Filled 2016-03-10: qty 2

## 2016-03-10 MED ORDER — POTASSIUM CHLORIDE 10 MEQ/50ML IV SOLN
10.0000 meq | INTRAVENOUS | Status: DC
Start: 2016-03-10 — End: 2016-03-10
  Administered 2016-03-10 (×2): 10 meq via INTRAVENOUS
  Filled 2016-03-10 (×2): qty 50

## 2016-03-10 MED ORDER — POTASSIUM CHLORIDE 10 MEQ/50ML IV SOLN
10.0000 meq | INTRAVENOUS | Status: AC
  Administered 2016-03-10 (×4): 10 meq via INTRAVENOUS
  Filled 2016-03-10 (×2): qty 50

## 2016-03-10 MED ORDER — POTASSIUM CHLORIDE CRYS ER 20 MEQ PO TBCR
40.0000 meq | EXTENDED_RELEASE_TABLET | Freq: Once | ORAL | Status: AC
  Administered 2016-03-10: 40 meq via ORAL
  Filled 2016-03-10: qty 2

## 2016-03-10 MED ORDER — ALUM & MAG HYDROXIDE-SIMETH 200-200-20 MG/5ML PO SUSP
30.0000 mL | Freq: Four times a day (QID) | ORAL | Status: DC | PRN
Start: 2016-03-10 — End: 2016-03-20
  Administered 2016-03-11: 30 mL via ORAL
  Filled 2016-03-10: qty 30

## 2016-03-10 MED ORDER — DEXTROSE 50 % IV SOLN
INTRAVENOUS | Status: AC
  Administered 2016-03-10: 25 mL
  Filled 2016-03-10: qty 50

## 2016-03-10 MED ORDER — IOPAMIDOL (ISOVUE-300) INJECTION 61%
30.0000 mL | Freq: Once | INTRAVENOUS | Status: AC | PRN
  Administered 2016-03-10: 30 mL via ORAL

## 2016-03-10 MED ORDER — IOPAMIDOL (ISOVUE-300) INJECTION 61%
100.0000 mL | Freq: Once | INTRAVENOUS | Status: AC | PRN
  Administered 2016-03-10: 100 mL via INTRAVENOUS

## 2016-03-10 MED ORDER — SODIUM CHLORIDE 0.9 % IV SOLN: Freq: Once | INTRAVENOUS | Status: DC

## 2016-03-10 NOTE — Progress Notes (Addendum)
PULMONARY / CRITICAL CARE MEDICINE   Name: [name] MRN: [id number] DOB: 08-25-1960    ADMISSION DATE:  03/08/2016 CONSULTATION DATE:  03/08/16  REFERRING MD:  Dr. Tomi Bamberger  CHIEF COMPLAINT:  AMS   HISTORY OF PRESENT ILLNESS:   55 y/o F with PMH of MultipleSclerosis (dx 2002), GERD, malnutrition, DM with neuropathy, CKD III (baseline sr cr ~ 0.5), DVT (**), chronic indwelling foley, stage 4 decubitus ulcer, anemia, HTN and lives at a SNF who [facility] to [facility] with altered mental status.   Per report, she was found at [facility] with altered mental status / lethargic.  She was last seen normal the evening prior.  EMS reported dried vomit on the patient when the picked her up.  Glucose was wnl. Initial ER evaluation notable for sr cr 2.57 (up from 0.51 01/2016),lactate 5.56, WBC 18.4.  She was noted to have drainage from her decubitus wounds.  She was found to be febrile to 100.4, tachycardic (144) and hypotensive (78/38).  Sepsis therapy activated with fluid resuscitation and empiric antibiotics.  CT abd s/o pelvic abscess? Dome of bladder    SUBJECTIVE:  Critically ill on lower neo gtt Afebrile Good UO C/o decreased pain lower abdomen  VITAL SIGNS: BP (!) 90/51   Pulse 85   Temp 98.9 F (37.2 C) (Oral)   Resp (!) 26   Ht 5\' 5"  (1.651 m)   Wt 147 lb 4.3 oz (66.8 kg)   SpO2 100%   BMI 24.51 kg/m   HEMODYNAMICS: CVP:  [5 mmHg-11 mmHg] 6 mmHg  VENTILATOR SETTINGS:    INTAKE / OUTPUT: I/O last 3 completed shifts: In: 8078.3 [P.O.:400; I.V.:4838.3; IV Piggyback:2840] Out: 5425 [id number]  PHYSICAL EXAMINATION: General:  Chronically ill appearing,weak Neuro:  Altered but moving upper ext to command, bed ridden. HEENT:  [place]/AT, PERRL, EOM-I and MMM. Cardiovascular:  RRR, Nl S1/S2, -M/R/G. Lungs:  CTA bilaterally. Abdomen:  Soft,  Tender bilateral lower quadrant, guarding, ND and +BS. Musculoskeletal: -edema and -tenderness. Skin:   Intact.  LABS:  BMET  Recent Labs Lab 03/08/16 2304 03/09/16 0305 03/10/16 0510  NA 140 141 140  K 3.2* 3.0* 2.8*  CL 112* 111 115*  CO2 21* 22 21*  BUN 42* 38* 15  CREATININE 1.39* 1.18* 0.49  GLUCOSE 122* 131* 81    Electrolytes  Recent Labs Lab 03/08/16 2304 03/09/16 0305 03/10/16 0510  CALCIUM 9.0 9.2 9.0  MG  --  1.8 1.8  PHOS  --  2.8  --     CBC  Recent Labs Lab 03/08/16 1411 03/09/16 0305 03/10/16 0510  WBC 18.4* 17.6* 16.9*  HGB 10.7* 7.6* 6.7*  HCT 33.7* 22.9* 20.2*  PLT 382 317 292    Coag's  Recent Labs Lab 03/08/16 2119 03/09/16 0720 03/09/16 1401  APTT 45* 47* 51*  INR 1.68  --   --     Sepsis Markers  Recent Labs Lab 03/08/16 1500 03/08/16 1518 03/08/16 2118 03/09/16 0105 03/09/16 0305 03/10/16 0510  LATICACIDVEN  --  5.56* 1.6 1.2  --   --   PROCALCITON 61.85  --   --   --  51.31 25.07    ABG No results for input(s): PHART, PCO2ART, PO2ART in the last 168 hours.  Liver Enzymes  Recent Labs Lab 03/08/16 1240  AST 21  ALT 13*  ALKPHOS 40  BILITOT 0.6  ALBUMIN 3.2*    Cardiac Enzymes  Recent Labs Lab 03/08/16 1500 03/08/16 2119 03/09/16 0305  TROPONINI 0.03* <0.03 <0.03    Glucose  Recent Labs Lab 03/09/16 1205 03/09/16 1640 03/09/16 2006 03/09/16 2333 03/10/16 0314 03/10/16 0748  GLUCAP 81 97 89 71 92 77    Imaging Dg Chest Port 1 View  Result Date: 03/10/2016 CLINICAL DATA:  Aspiration. EXAM: PORTABLE CHEST 1 VIEW COMPARISON:  03/08/2016. FINDINGS: Left IJ line in stable position. Mediastinum hilar structures are stable. Heart size stable. Low lung volumes with mild bibasilar atelectasis and/or infiltrates. Small left pleural effusion cannot be excluded. No pneumothorax. Drainage catheter noted over left upper abdomen. IMPRESSION: 1. Left IJ line in stable position. 2. Low lung volumes with mild bibasilar atelectasis and/or infiltrates. Small left pleural effusion cannot be excluded.  Electronically Signed   By: Marcello Moores  Register   On: 03/10/2016 07:15     STUDIES:  CT abd/ pelvis 9/26 >> Worsening inflammatory changes in the pelvis and right lower quadrant with interval development of peritoneal fluid collections probably representing abscess/phlegmon, and worsening inflammatory wall thickening of cecum, right lower quadrant small bowel loops, and the dome of the bladder. The abscess/phlegmon appears associated with the dome of the bladder  CULTURES: BCx2 9/26 >>  UC 9/26 >>   ANTIBIOTICS: Vanco 9/26 (empiric) >>  Zosyn 9/26 (empiric) >>  SIGNIFICANT EVENTS: 9/26  Admit from SNF with AMS, concern for sepsis.  Clear CXR.   LINES/TUBES: L IJ TLC 9/26>>>  DISCUSSION: 55 y/o F, SNF resident, with multiple sclerosis admitted on 9/26 with altered mental status and septic shock.  Potential sources of infection include pelvic abscess from UTI & decubitus ulcers.   ASSESSMENT / PLAN:  PULMONARY A: At Risk Respiratory Failure - in setting of altered mental status Possible Aspiration Event - vomiting prior to admit, FU CXR not impressive P:   Aspiration precautions O2 as needed to support sats >92%   CARDIOVASCULAR A:  Shock - concern for sepsis +/- component of hypovolemia with vomiting Tachycardia - reactive in setting of sepsis   P:  Appears dry -CVP 5 Neosynephrine for MAP >65   RENAL A:   Renal calculi s/p Left PCN AKI - in setting of possible sepsis, volume depletion and chronic disease -resolved CKD III  Elevated Lactate -resolved  hypokalemia P:   Monitor BMP / UOP  Replace electrolytes as indicated  GASTROINTESTINAL A:   Nausea / Vomiting  P:   Clear liquids PRN zofran    HEMATOLOGIC A:   Anemia - suspect of chronic disease, no active bleeding P:  1u PRBC -Trend CBC  Heparin drip in place of xarelto  INFECTIOUS A:   Shock, septic    Pelvic abscess P:   Abx as above  Follow cultures  Will need rpt CT with IV + PO contrast  now that cr better to better image  ENDOCRINE A:   DM  Diabetic Neuropathy    P:   Trend CBG SSI   NEUROLOGIC A:   Acute Encephalopathy - related to  sepsis, medications, CVA P:    Continue baclofen  FAMILY  - Updates: No family bedside   - Inter-disciplinary family meet or Palliative Care meeting due by:  10/3   My cc time x 31 m  Kara Mead MD. [facility]. [place] Pulmonary & Critical care Pager [phone] If no response call [phone]   03/10/2016

## 2016-03-10 NOTE — Progress Notes (Signed)
[facility] Progress Note Patient Name: [name] DOB: 23-Jun-1960 MRN: [id number]   Date of Service  03/10/2016  HPI/Events of Note  Hgb 6.7  eICU Interventions  One unit PRBCs     Intervention Category Intermediate Interventions: Bleeding - evaluation and treatment with blood products  Wilhelmina Mcardle 03/10/2016, 6:17 AM

## 2016-03-10 NOTE — Progress Notes (Signed)
Erin in Pharmacy informed   IV that Heparin was infusing through was infiltrated. Heparin infusion moved to central line. Lab notified to draw Heparin level via vein at 2300.

## 2016-03-10 NOTE — Progress Notes (Addendum)
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)  No Known Allergies  Patient Measurements: Height: 5\' 5"  (165.1 cm) Weight: 147 lb 4.3 oz (66.8 kg) IBW/kg (Calculated) : 57 Heparin Dosing Wt: 43kg  Vital Signs: Temp: 98.7 F (37.1 C) (09/28 1156) Temp Source: Oral (09/28 1156) BP: 137/76 (09/28 1500) Pulse Rate: 80 (09/28 1400)  Labs:  Recent Labs  03/08/16 1500  03/08/16 2119 03/08/16 2304 03/09/16 0305 03/09/16 0720 03/09/16 1401 03/10/16 0510 03/10/16 0740 03/10/16 1340  HGB  --   --   --   --  7.6*  --   --  6.7*  --  7.2*  HCT  --   --   --   --  22.9*  --   --  20.2*  --  21.0*  PLT  --   --   --   --  317  --   --  292  --  263  APTT  --   < > 45*  --   --  47* 51*  --  39*  --   LABPROT  --   --  20.0*  --   --   --   --   --   --   --   INR  --   --  1.68  --   --   --   --   --   --   --   HEPARINUNFRC  --   < >  --   --   --  0.70 0.18*  --  0.11* <0.10*  CREATININE  --   --   --  1.39* 1.18*  --   --  0.49  --   --   TROPONINI 0.03*  --  <0.03  --  <0.03  --   --   --   --   --   < > = values in this interval not displayed.  Estimated Creatinine Clearance: 69.8 mL/min (by C-G formula based on SCr of 0.49 mg/dL).   Medical History: Past Medical History:  Diagnosis Date  . Acute renal failure superimposed on stage 3 chronic kidney disease ([place]) 07/21/2015  . Anemia    chronic   . Diabetes mellitus without complication ([place])   . Diabetic neuropathy ([place])   . DVT (deep venous thrombosis) ([place])   . DVT (deep venous thrombosis) ([place])   . GERD (gastroesophageal reflux disease)   . Hypertension   . Left nephrolithiasis   . Leukocytosis   . Lymph edema    chronic   . Malnutrition of moderate degree 07/21/2015  . Multiple sclerosis diagnosed 2002-not on Therapy any longer 06/26/2012  . Muscle weakness (generalized)   . Neuromuscular disorder ([place])   . Neuromuscular dysfunction of  bladder   . Polyneuropathy ([place])   . Presence of indwelling urinary catheter   . Protein calorie malnutrition ([place])   . Stage 4 decubitus ulcer ([place]) 07/05/2015  . Stroke ([place])   . UTI (lower urinary tract infection)     Medications:   Xarelto 20mg  daily PTA- LD 9/25 @ 1700 Infusions:  . sodium chloride 100 mL/hr at 03/10/16 0823  . heparin 900 Units/hr (03/10/16 0939)  . phenylephrine (NEO-SYNEPHRINE) Adult infusion Stopped (03/10/16 [id number])    Assessment: 55 yo F on chronic Xarelto for hx VTE which is being held due to acute illness/ renal failure (est CrCl <79ml/min). Last xarelto dose taken on 03/07/16  at 1700.  Today, 03/10/2016 at 07:40 - heparin level 0.11 on 750 units/hr; Per RN, no issue with IV drip  - hgb continues to trend down 6.7, plt 292; received 1 unit PRBC today - per RN, no bleeding noted - renal funct has improved, scr down 0.49 (crcl~47)  At 13:40 - heparin level < 0.1 on 900 units/hr: spoke with RN, no issues with IV site - per RN, no bleeding noted - heparin infusion via peripheral IV, level drawn from central line  At 16:10 - heparin level < 0.1 again, per RN, IV was infiltrated  Goal of Therapy:  Heparin level 0.3-0.7 units/ml; aPTT 66-102sec until correlates with heparin level Monitor platelets by anticoagulation protocol: Yes   Plan:   Restart heparin 900 units/hr via CVL  Check heparin level in 6hrs via peripheral stick  Transition back to Xarelto if interventions complete?  Peggyann Juba, PharmD, BCPS Pager: [phone] 03/10/2016,3:36 PM

## 2016-03-10 NOTE — Progress Notes (Signed)
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)  No Known Allergies  Patient Measurements: Height: 5\' 5"  (165.1 cm) Weight: 147 lb 4.3 oz (66.8 kg) IBW/kg (Calculated) : 57 Heparin Dosing Wt: 43kg  Vital Signs: Temp: 98.9 F (37.2 C) (09/28 0830) Temp Source: Oral (09/28 0830) BP: 100/47 (09/28 0830) Pulse Rate: 80 (09/28 0830)  Labs:  Recent Labs  03/08/16 1411 03/08/16 1500  03/08/16 2119 03/08/16 2304 03/09/16 0305 03/09/16 0720 03/09/16 1401 03/10/16 0510 03/10/16 0740  HGB 10.7*  --   --   --   --  7.6*  --   --  6.7*  --   HCT 33.7*  --   --   --   --  22.9*  --   --  20.2*  --   PLT 382  --   --   --   --  317  --   --  292  --   APTT  --   --   < > 45*  --   --  47* 51*  --  39*  LABPROT  --   --   --  20.0*  --   --   --   --   --   --   INR  --   --   --  1.68  --   --   --   --   --   --   HEPARINUNFRC  --   --   < >  --   --   --  0.70 0.18*  --  0.11*  CREATININE  --   --   --   --  1.39* 1.18*  --   --  0.49  --   TROPONINI  --  0.03*  --  <0.03  --  <0.03  --   --   --   --   < > = values in this interval not displayed.  Estimated Creatinine Clearance: 69.8 mL/min (by C-G formula based on SCr of 0.49 mg/dL).   Medical History: Past Medical History:  Diagnosis Date  . Acute renal failure superimposed on stage 3 chronic kidney disease ([place]) 07/21/2015  . Anemia    chronic   . Diabetes mellitus without complication ([place])   . Diabetic neuropathy ([place])   . DVT (deep venous thrombosis) ([place])   . DVT (deep venous thrombosis) ([place])   . GERD (gastroesophageal reflux disease)   . Hypertension   . Left nephrolithiasis   . Leukocytosis   . Lymph edema    chronic   . Malnutrition of moderate degree 07/21/2015  . Multiple sclerosis diagnosed 2002-not on Therapy any longer 06/26/2012  . Muscle weakness (generalized)   . Neuromuscular disorder ([place])   . Neuromuscular dysfunction of  bladder   . Polyneuropathy ([place])   . Presence of indwelling urinary catheter   . Protein calorie malnutrition ([place])   . Stage 4 decubitus ulcer ([place]) 07/05/2015  . Stroke ([place])   . UTI (lower urinary tract infection)     Medications:   Xarelto 20mg  daily PTA- LD 9/25 @ 1700 Infusions:  . sodium chloride 100 mL/hr at 03/10/16 0823  . heparin 750 Units/hr (03/10/16 [id number])  . phenylephrine (NEO-SYNEPHRINE) Adult infusion Stopped (03/10/16 [id number])    Assessment: 55 yo F on chronic Xarelto for hx VTE which is being held due to acute illness/ renal failure (est CrCl <77ml/min). Last xarelto dose taken  on 03/07/16 at 1700.  Today, 03/10/2016: - heparin level 0.11, aPTT 39 --> low; Per RN, no issue with IV drip  - hgb continues to trend down 6.7, plt 292; plan for 1 unit PRBC today - per RN, no bleeding noted - renal funct has improved, scr down 0.49 (crcl~47)   Goal of Therapy:  Heparin level 0.3-0.7 units/ml; aPTT 66-102sec until correlates with heparin level Monitor platelets by anticoagulation protocol: Yes   Plan:   Increase drip heparin infusion to 900 units/hr  Check 6hr heparin level   Monitor for s/s bleeding   Yash Cacciola P 03/10/2016,8:44 AM

## 2016-03-10 NOTE — Progress Notes (Signed)
Pt was scheduled for percutaneous mgmt of renal stone in 4 days. I see no reason to do this during the pt's current hospital stay. I would recommend treeating her current infectious process, then proceeding at a later tim with this elective procedure. We will followup during this hospitalization to reschedule.

## 2016-03-10 NOTE — Progress Notes (Signed)
Summary Note: NPO earlier for procedures; CBG check post procedure was 60. Asymptomatic except said she felt hungry. Given 6oz of reg GA; follow up check 60. Dex 50, 41ml pushed, with next CBG 96. Ate 50% of her dinner than developed epigastric discomfort with hyperactive bowel sounds, and felt "short of breath". Sats 100, RR 28, no WOB noted. Given PRN Neb. Discussed events with Luellen Pucker, RN in [place] who will share with Dr. Halford Chessman.

## 2016-03-10 NOTE — Progress Notes (Signed)
CRITICAL VALUE ALERT  Critical value received:  Hgb 6.7  Date of notification:  03/10/16  Time of notification:  [id number]  Critical value read back: yes  Nurse who received alert:  Marye Round, RN  MD notified (1st page):  CCM, Simonds MD  Time of first page: 0610  MD notified (2nd page):  Time of second page:  Responding MD: Alva Garnet  Time MD responded: [phone]

## 2016-03-11 LAB — BASIC METABOLIC PANEL
ANION GAP: 4 — AB (ref 5–15)
BUN: 9 mg/dL (ref 6–20)
CO2: 19 mmol/L — ABNORMAL LOW (ref 22–32)
Calcium: 9 mg/dL (ref 8.9–10.3)
Chloride: 119 mmol/L — ABNORMAL HIGH (ref 101–111)
Creatinine, Ser: 0.52 mg/dL (ref 0.44–1.00)
GFR calc non Af Amer: >60 mL/min (ref >60)
Glucose, Bld: 76 mg/dL (ref 65–99)
POTASSIUM: 3.6 mmol/L (ref 3.5–5.1)
SODIUM: 142 mmol/L (ref 135–145)

## 2016-03-11 LAB — CBC
HCT: 21.7 % — ABNORMAL LOW (ref 36.0–46.0)
HEMOGLOBIN: 7.3 g/dL — AB (ref 12.0–15.0)
MCH: 29.9 pg (ref 26.0–34.0)
MCHC: 33.6 g/dL (ref 30.0–36.0)
MCV: 88.9 fL (ref 78.0–100.0)
PLATELETS: 271 10*3/uL (ref 150–400)
RBC: 2.44 MIL/uL — AB (ref 3.87–5.11)
RDW: 15.8 % — ABNORMAL HIGH (ref 11.5–15.5)
WBC: 14.5 10*3/uL — AB (ref 4.0–10.5)

## 2016-03-11 LAB — PROTIME-INR
INR: 1.2
Prothrombin Time: 15.2 seconds (ref 11.4–15.2)

## 2016-03-11 LAB — HEPARIN LEVEL (UNFRACTIONATED)
HEPARIN UNFRACTIONATED: 0.22 [IU]/mL — AB (ref 0.30–0.70)
HEPARIN UNFRACTIONATED: 0.2 [IU]/mL — AB (ref 0.30–0.70)
Heparin Unfractionated: 0.33 IU/mL (ref 0.30–0.70)

## 2016-03-11 LAB — GLUCOSE, CAPILLARY
GLUCOSE-CAPILLARY: 82 mg/dL (ref 65–99)
GLUCOSE-CAPILLARY: 86 mg/dL (ref 65–99)
Glucose-Capillary: 159 mg/dL — ABNORMAL HIGH (ref 65–99)
Glucose-Capillary: 164 mg/dL — ABNORMAL HIGH (ref 65–99)
Glucose-Capillary: 172 mg/dL — ABNORMAL HIGH (ref 65–99)
Glucose-Capillary: 177 mg/dL — ABNORMAL HIGH (ref 65–99)
Glucose-Capillary: 72 mg/dL (ref 65–99)

## 2016-03-11 LAB — PHOSPHORUS: Phosphorus: 2 mg/dL — ABNORMAL LOW (ref 2.5–4.6)

## 2016-03-11 LAB — MAGNESIUM: Magnesium: 1.6 mg/dL — ABNORMAL LOW (ref 1.7–2.4)

## 2016-03-11 MED ORDER — HEPARIN BOLUS VIA INFUSION
1000.0000 [IU] | Freq: Once | INTRAVENOUS | Status: AC
  Administered 2016-03-11: 1000 [IU] via INTRAVENOUS
  Filled 2016-03-11: qty 1000

## 2016-03-11 MED ORDER — SODIUM CHLORIDE 0.9 % IV BOLUS (SEPSIS)
500.0000 mL | Freq: Once | INTRAVENOUS | Status: AC
  Administered 2016-03-11: 500 mL via INTRAVENOUS

## 2016-03-11 MED ORDER — HEPARIN (PORCINE) IN NACL 100-0.45 UNIT/ML-% IJ SOLN
1450.0000 [IU]/h | INTRAMUSCULAR | Status: DC
  Administered 2016-03-11 – 2016-03-12 (×4): 1450 [IU]/h via INTRAVENOUS
  Filled 2016-03-11 (×5): qty 250

## 2016-03-11 NOTE — Progress Notes (Signed)
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)  No Known Allergies  Patient Measurements: Height: 5\' 5"  (165.1 cm) Weight: 147 lb 4.3 oz (66.8 kg) IBW/kg (Calculated) : 57 Heparin Dosing Weight:   Vital Signs: Temp: 99 F (37.2 C) (09/28 2343) Temp Source: Oral (09/28 2343) BP: 90/44 (09/28 2352) Pulse Rate: 80 (09/28 1400)  Labs:  Recent Labs  03/08/16 1500  03/08/16 2119 03/08/16 2304 03/09/16 0305 03/09/16 0720 03/09/16 1401 03/10/16 0510 03/10/16 0740 03/10/16 1340 03/10/16 1610 03/10/16 2250  HGB  --   --   --   --  7.6*  --   --  6.7*  --  7.2*  --   --   HCT  --   --   --   --  22.9*  --   --  20.2*  --  21.0*  --   --   PLT  --   --   --   --  317  --   --  292  --  263  --   --   APTT  --   < > 45*  --   --  47* 51*  --  39*  --   --   --   LABPROT  --   --  20.0*  --   --   --   --   --   --   --   --   --   INR  --   --  1.68  --   --   --   --   --   --   --   --   --   HEPARINUNFRC  --   < >  --   --   --  0.70 0.18*  --  0.11* <0.10* <0.10* 0.11*  CREATININE  --   --   --  1.39* 1.18*  --   --  0.49  --   --   --   --   TROPONINI 0.03*  --  <0.03  --  <0.03  --   --   --   --   --   --   --   < > = values in this interval not displayed.  Estimated Creatinine Clearance: 69.8 mL/min (by C-G formula based on SCr of 0.49 mg/dL).   Medications:  Infusions:  . sodium chloride 100 mL/hr at 03/10/16 2022  . heparin 900 Units/hr (03/10/16 1630)  . phenylephrine (NEO-SYNEPHRINE) Adult infusion Stopped (03/10/16 [id number])    Assessment: Patient with low heparin level.  No heparin issues per RN.  Goal of Therapy:  Heparin level 0.3-0.7 units/ml Monitor platelets by anticoagulation protocol: Yes   Plan:  Increase heparin to 1100 units/hr Recheck level at [place], Shea Stakes [place] 03/11/2016,12:35 AM

## 2016-03-11 NOTE — Consult Note (Signed)
[facility] Surgery Consult Note  MACKENIZE DELGADILLO 19-Feb-1961  [id number].    Requesting MD: Salvadore Dom NP Chief Complaint/Reason for Consult: Pelvic abscess  HPI:  [name] is a 55yo female PMH of Multiple Sclerosis (dx 2002), GERD, malnutrition, DM with neuropathy, CKD III (baseline sr Cr ~ 0.5), DVT (on xarelto), neurogenic bladder with chronic indwelling foley, stage 4 decubitus ulcer, anemia, HTN and lives at a SNF who presented to [facility] on 03/08/16 with altered mental status and sepsis. Unknown at the time what what the source of infection was, although it was thought to be either urosepsis or 2/2 decubitus ulcer. Now the likely sources are thought to either be urosepsis or an anterior abdominal abscess/fluid collection.  H/o kidney stones s/p perc tube placement 12/2015. Was being followed by Dr Diona Fanti who scheduled perc removal of renal and ureteral stones 03/14/16. Urology plans to delay this procedure and reschedule at a later time as OP. They also do not believe that the fluid/abscess is from UTI.  Patient's status is now improving. She is normotensive and off the ventilator. She will likely be transferred to the unit soon. Continues to complain of abdominal pain. Unsure how long it has been going on. It is constant. She does not feel that anything makes it better or worse. No documented BM since admission. No emesis in 2 days.  Abdominal surgical history includes tubal ligation and hernia repair  ROS: All systems reviewed and otherwise negative except for as above  Family History  Problem Relation Age of Onset  . Diabetes Mother   . Alzheimer's disease Mother   . Diabetes Father   . Diabetes Sister   . Scoliosis Sister     Past Medical History:  Diagnosis Date  . Acute renal failure superimposed on stage 3 chronic kidney disease ([place]) 07/21/2015  . Anemia    chronic   . Diabetes mellitus without complication ([place])   . Diabetic neuropathy ([place])   . DVT (deep  venous thrombosis) ([place])   . DVT (deep venous thrombosis) ([place])   . GERD (gastroesophageal reflux disease)   . Hypertension   . Left nephrolithiasis   . Leukocytosis   . Lymph edema    chronic   . Malnutrition of moderate degree 07/21/2015  . Multiple sclerosis diagnosed 2002-not on Therapy any longer 06/26/2012  . Muscle weakness (generalized)   . Neuromuscular disorder ([place])   . Neuromuscular dysfunction of bladder   . Polyneuropathy ([place])   . Presence of indwelling urinary catheter   . Protein calorie malnutrition ([place])   . Stage 4 decubitus ulcer ([place]) 07/05/2015  . Stroke ([place])   . UTI (lower urinary tract infection)     Past Surgical History:  Procedure Laterality Date  . ESOPHAGOGASTRODUODENOSCOPY Left 01/02/2015   Procedure: ESOPHAGOGASTRODUODENOSCOPY (EGD);  Surgeon: Arta Silence, MD;  Location: [facility];  Service: Endoscopy;  Laterality: Left;  . HERNIA MESH REMOVAL    . IR GENERIC HISTORICAL  02/23/2016   IR NEPHROSTOMY EXCHANGE LEFT 02/23/2016 Sandi Mariscal, MD [facility]INTER[facility] RAD  . NEPHROSTOMY TUBE PLACEMENT ([place] HX)    . TUBAL LIGATION    . tubes tided      Social History:  reports that she quit smoking about 15 months ago. Her smoking use included Cigarettes. She has a 20.00 pack-year smoking history. She has never used smokeless tobacco. She reports that she drinks about 3.0 oz of alcohol per week . She reports that she does not use drugs.  Allergies: No  Known Allergies  Medications Prior to Admission  Medication Sig Dispense Refill  . albuterol (PROVENTIL) (2.5 MG/3ML) 0.083% nebulizer solution Take 3 mLs (2.5 mg total) by nebulization every 6 (six) hours as needed for wheezing or shortness of breath. 75 mL 12  . Amino Acids-Protein Hydrolys (FEEDING SUPPLEMENT, PRO-STAT SUGAR FREE 64,) LIQD Take 30 mLs by mouth 2 (two) times daily. 900 mL 0  . atorvastatin (LIPITOR) 10 MG tablet Take 10 mg by mouth at bedtime.     . baclofen (LIORESAL) 10 MG tablet Take 1 tablet  (10 mg total) by mouth 3 (three) times daily. 90 each 0  . famotidine (PEPCID) 20 MG tablet Take 1 tablet (20 mg total) by mouth 2 (two) times daily. 60 tablet 0  . furosemide (LASIX) 20 MG tablet Take 40 mg by mouth 2 (two) times daily.    Marland Kitchen gabapentin (NEURONTIN) 100 MG capsule Take 1 capsule (100 mg total) by mouth at bedtime. 30 capsule 0  . glipiZIDE (GLUCOTROL) 5 MG tablet Take 5 mg by mouth 2 (two) times daily.  0  . lisinopril (PRINIVIL,ZESTRIL) 2.5 MG tablet Take 2.5 mg by mouth daily.  0  . metFORMIN (GLUCOPHAGE) 500 MG tablet Take 500 mg by mouth 2 (two) times daily.    . Nutritional Supplements (NUTRITIONAL SHAKE) LIQD Take 240 mLs by mouth 2 (two) times daily. *Med Pass 2.0*    . Omega 3 1000 MG CAPS Take 1,000 mg by mouth daily.    . polyethylene glycol (MIRALAX / GLYCOLAX) packet Take 17 g by mouth 2 (two) times daily. 14 each 0  . rivaroxaban (XARELTO) 20 MG TABS tablet Take 1 tablet (20 mg total) by mouth daily with supper. Reported on 08/12/2015 30 tablet 0  . senna-docusate (SENOKOT-S) 8.6-50 MG tablet Take 1 tablet by mouth 2 (two) times daily. 30 tablet 0  . solifenacin (VESICARE) 5 MG tablet Take 1 tablet (5 mg total) by mouth daily. 30 tablet 0  . feeding supplement, ENSURE ENLIVE, (ENSURE ENLIVE) LIQD Take 237 mLs by mouth 2 (two) times daily between meals. (Patient not taking: Reported on 03/08/2016) 237 mL 12  . Multiple Vitamin (MULTIVITAMIN WITH MINERALS) TABS tablet Take 1 tablet by mouth daily. (Patient not taking: Reported on 03/08/2016) 30 tablet 0    Blood pressure (!) 110/45, pulse 80, temperature 99.3 F (37.4 C), temperature source Oral, resp. rate (!) 21, height '5\' 5"'  (1.651 m), weight 153 lb 7 oz (69.6 kg), SpO2 100 %. Physical Exam: General: chroically ill appearing, pleasant, AA female who is laying in bed in NAD HEENT: head is normocephalic, atraumatic.   Heart: regular, rate, and rhythm Lungs: CTAB Abd: soft, ND, +BS, no masses, hernias, or organomegaly.  Tender diffusely but most severe in lower abdomen with guarding. No skin changes. Skin: warm and dry with no masses, lesions, or rashes Psych: alert and oriented to self. Moves upper extremities, bed ridden.   Results for orders placed or performed during the hospital encounter of 03/08/16 (from the past 48 hour(s))  Heparin level (unfractionated)     Status: Abnormal   Collection Time: 03/09/16  2:01 PM  Result Value Ref Range   Heparin Unfractionated 0.18 (L) 0.30 - 0.70 IU/mL    Comment:        IF HEPARIN RESULTS ARE BELOW EXPECTED VALUES, AND PATIENT DOSAGE HAS BEEN CONFIRMED, SUGGEST FOLLOW UP TESTING OF ANTITHROMBIN III LEVELS.   APTT     Status: Abnormal   Collection Time: 03/09/16  2:01 PM  Result Value Ref Range   aPTT 51 (H) 24 - 36 seconds    Comment:        IF BASELINE aPTT IS ELEVATED, SUGGEST PATIENT RISK ASSESSMENT BE USED TO DETERMINE APPROPRIATE ANTICOAGULANT THERAPY.   Glucose, capillary     Status: None   Collection Time: 03/09/16  4:40 PM  Result Value Ref Range   Glucose-Capillary 97 65 - 99 mg/dL  Glucose, capillary     Status: None   Collection Time: 03/09/16  8:06 PM  Result Value Ref Range   Glucose-Capillary 89 65 - 99 mg/dL  Glucose, capillary     Status: None   Collection Time: 03/09/16 11:33 PM  Result Value Ref Range   Glucose-Capillary 71 65 - 99 mg/dL  Glucose, capillary     Status: None   Collection Time: 03/10/16  3:14 AM  Result Value Ref Range   Glucose-Capillary 92 65 - 99 mg/dL  Procalcitonin     Status: None   Collection Time: 03/10/16  5:10 AM  Result Value Ref Range   Procalcitonin 25.07 ng/mL    Comment:        Interpretation: PCT >= 10 ng/mL: Important systemic inflammatory response, almost exclusively due to severe bacterial sepsis or septic shock. (NOTE)         ICU PCT Algorithm               Non ICU PCT Algorithm    ----------------------------     ------------------------------         PCT < 0.25 ng/mL                  PCT < 0.1 ng/mL     Stopping of antibiotics            Stopping of antibiotics       strongly encouraged.               strongly encouraged.    ----------------------------     ------------------------------       PCT level decrease by               PCT < 0.25 ng/mL       >= 80% from peak PCT       OR PCT 0.25 - 0.5 ng/mL          Stopping of antibiotics                                             encouraged.     Stopping of antibiotics           encouraged.    ----------------------------     ------------------------------       PCT level decrease by              PCT >= 0.25 ng/mL       < 80% from peak PCT        AND PCT >= 0.5 ng/mL             Continuing antibiotics                                              encouraged.       Continuing antibiotics  encouraged.    ----------------------------     ------------------------------     PCT level increase compared          PCT > 0.5 ng/mL         with peak PCT AND          PCT >= 0.5 ng/mL             Escalation of antibiotics                                          strongly encouraged.      Escalation of antibiotics        strongly encouraged.   CBC     Status: Abnormal   Collection Time: 03/10/16  5:10 AM  Result Value Ref Range   WBC 16.9 (H) 4.0 - 10.5 K/uL   RBC 2.30 (L) 3.87 - 5.11 MIL/uL   Hemoglobin 6.7 (LL) 12.0 - 15.0 g/dL    Comment: REPEATED TO VERIFY CRITICAL RESULT CALLED TO, READ BACK BY AND VERIFIED WITH: SCOTT,B RN 9.28.17 '@[id number]'  ZANDO,C    HCT 20.2 (L) 36.0 - 46.0 %   MCV 87.8 78.0 - 100.0 fL   MCH 29.1 26.0 - 34.0 pg   MCHC 33.2 30.0 - 36.0 g/dL   RDW 15.7 (H) 11.5 - 15.5 %   Platelets 292 150 - 400 K/uL  BMET in AM     Status: Abnormal   Collection Time: 03/10/16  5:10 AM  Result Value Ref Range   Sodium 140 135 - 145 mmol/L   Potassium 2.8 (L) 3.5 - 5.1 mmol/L   Chloride 115 (H) 101 - 111 mmol/L   CO2 21 (L) 22 - 32 mmol/L   Glucose, Bld 81 65 - 99 mg/dL   BUN 15 6 - 20 mg/dL    Creatinine, Ser 0.49 0.44 - 1.00 mg/dL   Calcium 9.0 8.9 - 10.3 mg/dL   GFR calc non Af Amer >60 >60 mL/min   GFR calc Af Amer >60 >60 mL/min    Comment: (NOTE) The eGFR has been calculated using the CKD EPI equation. This calculation has not been validated in all clinical situations. eGFR's persistently <60 mL/min signify possible Chronic Kidney Disease.    Anion gap 4 (L) 5 - 15  Magnesium in AM     Status: None   Collection Time: 03/10/16  5:10 AM  Result Value Ref Range   Magnesium 1.8 1.7 - 2.4 mg/dL  Heparin level (unfractionated)     Status: Abnormal   Collection Time: 03/10/16  7:40 AM  Result Value Ref Range   Heparin Unfractionated 0.11 (L) 0.30 - 0.70 IU/mL    Comment:        IF HEPARIN RESULTS ARE BELOW EXPECTED VALUES, AND PATIENT DOSAGE HAS BEEN CONFIRMED, SUGGEST FOLLOW UP TESTING OF ANTITHROMBIN III LEVELS.   APTT     Status: Abnormal   Collection Time: 03/10/16  7:40 AM  Result Value Ref Range   aPTT 39 (H) 24 - 36 seconds    Comment:        IF BASELINE aPTT IS ELEVATED, SUGGEST PATIENT RISK ASSESSMENT BE USED TO DETERMINE APPROPRIATE ANTICOAGULANT THERAPY.   Type and screen [place]     Status: None   Collection Time: 03/10/16  7:40 AM  Result Value Ref Range   ABO/RH(D) A POS    Antibody  Screen NEG    Sample Expiration 03/13/2016    Unit Number [id number]    Blood Component Type RBC LR PHER2    Unit division 00    Status of Unit ISSUED,FINAL    Transfusion Status OK TO TRANSFUSE    Crossmatch Result Compatible   Prepare RBC     Status: None   Collection Time: 03/10/16  7:40 AM  Result Value Ref Range   Order Confirmation ORDER PROCESSED BY BLOOD BANK   Glucose, capillary     Status: None   Collection Time: 03/10/16  7:48 AM  Result Value Ref Range   Glucose-Capillary 77 65 - 99 mg/dL   Comment 1 Notify RN    Comment 2 Document in Chart   Glucose, capillary     Status: Abnormal   Collection Time: 03/10/16 11:25 AM   Result Value Ref Range   Glucose-Capillary 60 (L) 65 - 99 mg/dL   Comment 1 Notify RN    Comment 2 Document in Chart   Glucose, capillary     Status: Abnormal   Collection Time: 03/10/16 12:00 PM  Result Value Ref Range   Glucose-Capillary 104 (H) 65 - 99 mg/dL   Comment 1 Notify RN    Comment 2 Document in Chart   Heparin level (unfractionated)     Status: Abnormal   Collection Time: 03/10/16  1:40 PM  Result Value Ref Range   Heparin Unfractionated <0.10 (L) 0.30 - 0.70 IU/mL    Comment:        IF HEPARIN RESULTS ARE BELOW EXPECTED VALUES, AND PATIENT DOSAGE HAS BEEN CONFIRMED, SUGGEST FOLLOW UP TESTING OF ANTITHROMBIN III LEVELS.   CBC with Differential/Platelet     Status: Abnormal   Collection Time: 03/10/16  1:40 PM  Result Value Ref Range   WBC 17.3 (H) 4.0 - 10.5 K/uL   RBC 2.43 (L) 3.87 - 5.11 MIL/uL   Hemoglobin 7.2 (L) 12.0 - 15.0 g/dL   HCT 21.0 (L) 36.0 - 46.0 %   MCV 86.4 78.0 - 100.0 fL   MCH 29.6 26.0 - 34.0 pg   MCHC 34.3 30.0 - 36.0 g/dL   RDW 15.3 11.5 - 15.5 %   Platelets 263 150 - 400 K/uL   Neutrophils Relative % 78 %   Lymphocytes Relative 15 %   Monocytes Relative 2 %   Eosinophils Relative 1 %   Basophils Relative 0 %   Band Neutrophils 4 %   Neutro Abs 14.2 (H) 1.7 - 7.7 K/uL   Lymphs Abs 2.6 0.7 - 4.0 K/uL   Monocytes Absolute 0.3 0.1 - 1.0 K/uL   Eosinophils Absolute 0.2 0.0 - 0.7 K/uL   Basophils Absolute 0.0 0.0 - 0.1 K/uL   RBC Morphology POLYCHROMASIA PRESENT     Comment: BURR CELLS   WBC Morphology DOHLE BODIES   Glucose, capillary     Status: Abnormal   Collection Time: 03/10/16  3:48 PM  Result Value Ref Range   Glucose-Capillary 60 (L) 65 - 99 mg/dL   Comment 1 Notify RN    Comment 2 Document in Chart   Glucose, capillary     Status: Abnormal   Collection Time: 03/10/16  4:07 PM  Result Value Ref Range   Glucose-Capillary 60 (L) 65 - 99 mg/dL   Comment 1 Notify RN    Comment 2 Document in Chart   Heparin level  (unfractionated)     Status: Abnormal   Collection Time: 03/10/16  4:10 PM  Result  Value Ref Range   Heparin Unfractionated <0.10 (L) 0.30 - 0.70 IU/mL    Comment:        IF HEPARIN RESULTS ARE BELOW EXPECTED VALUES, AND PATIENT DOSAGE HAS BEEN CONFIRMED, SUGGEST FOLLOW UP TESTING OF ANTITHROMBIN III LEVELS.   Glucose, capillary     Status: None   Collection Time: 03/10/16  4:35 PM  Result Value Ref Range   Glucose-Capillary 96 65 - 99 mg/dL   Comment 1 Notify RN    Comment 2 Document in Chart   Glucose, capillary     Status: None   Collection Time: 03/10/16  8:26 PM  Result Value Ref Range   Glucose-Capillary 98 65 - 99 mg/dL  Heparin level (unfractionated)     Status: Abnormal   Collection Time: 03/10/16 10:50 PM  Result Value Ref Range   Heparin Unfractionated 0.11 (L) 0.30 - 0.70 IU/mL    Comment:        IF HEPARIN RESULTS ARE BELOW EXPECTED VALUES, AND PATIENT DOSAGE HAS BEEN CONFIRMED, SUGGEST FOLLOW UP TESTING OF ANTITHROMBIN III LEVELS.   Glucose, capillary     Status: None   Collection Time: 03/10/16 11:42 PM  Result Value Ref Range   Glucose-Capillary 72 65 - 99 mg/dL  Glucose, capillary     Status: None   Collection Time: 03/11/16  3:52 AM  Result Value Ref Range   Glucose-Capillary 82 65 - 99 mg/dL  CBC     Status: Abnormal   Collection Time: 03/11/16  4:06 AM  Result Value Ref Range   WBC 14.5 (H) 4.0 - 10.5 K/uL   RBC 2.44 (L) 3.87 - 5.11 MIL/uL   Hemoglobin 7.3 (L) 12.0 - 15.0 g/dL   HCT 21.7 (L) 36.0 - 46.0 %   MCV 88.9 78.0 - 100.0 fL   MCH 29.9 26.0 - 34.0 pg   MCHC 33.6 30.0 - 36.0 g/dL   RDW 15.8 (H) 11.5 - 15.5 %   Platelets 271 150 - 400 K/uL  Basic metabolic panel     Status: Abnormal   Collection Time: 03/11/16  4:06 AM  Result Value Ref Range   Sodium 142 135 - 145 mmol/L   Potassium 3.6 3.5 - 5.1 mmol/L    Comment: DELTA CHECK NOTED NO VISIBLE HEMOLYSIS    Chloride 119 (H) 101 - 111 mmol/L   CO2 19 (L) 22 - 32 mmol/L   Glucose,  Bld 76 65 - 99 mg/dL   BUN 9 6 - 20 mg/dL   Creatinine, Ser 0.52 0.44 - 1.00 mg/dL   Calcium 9.0 8.9 - 10.3 mg/dL   GFR calc non Af Amer >60 >60 mL/min   GFR calc Af Amer >60 >60 mL/min    Comment: (NOTE) The eGFR has been calculated using the CKD EPI equation. This calculation has not been validated in all clinical situations. eGFR's persistently <60 mL/min signify possible Chronic Kidney Disease.    Anion gap 4 (L) 5 - 15  Magnesium     Status: Abnormal   Collection Time: 03/11/16  4:06 AM  Result Value Ref Range   Magnesium 1.6 (L) 1.7 - 2.4 mg/dL  Phosphorus     Status: Abnormal   Collection Time: 03/11/16  4:06 AM  Result Value Ref Range   Phosphorus 2.0 (L) 2.5 - 4.6 mg/dL  Protime-INR     Status: None   Collection Time: 03/11/16  4:06 AM  Result Value Ref Range   Prothrombin Time 15.2 11.4 - 15.2 seconds  INR 1.20   Glucose, capillary     Status: None   Collection Time: 03/11/16  8:03 AM  Result Value Ref Range   Glucose-Capillary 86 65 - 99 mg/dL  Heparin level (unfractionated)     Status: Abnormal   Collection Time: 03/11/16  8:18 AM  Result Value Ref Range   Heparin Unfractionated 0.22 (L) 0.30 - 0.70 IU/mL    Comment:        IF HEPARIN RESULTS ARE BELOW EXPECTED VALUES, AND PATIENT DOSAGE HAS BEEN CONFIRMED, SUGGEST FOLLOW UP TESTING OF ANTITHROMBIN III LEVELS.    Ct Abdomen Pelvis W Contrast  Result Date: 03/10/2016 CLINICAL DATA:  55 year old female inpatient with multiple sclerosis admitted with sepsis and draining decubitus ulcers, with suggestion of pelvic abscess on CT study from 2 days prior, presenting for follow-up EXAM: CT ABDOMEN AND PELVIS WITH CONTRAST TECHNIQUE: Multidetector CT imaging of the abdomen and pelvis was performed using the standard protocol following bolus administration of intravenous contrast. CONTRAST:  158m ISOVUE-300 IOPAMIDOL (ISOVUE-300) INJECTION 61% COMPARISON:  03/08/2016 CT abdomen/ pelvis. FINDINGS: Lower chest: Right  middle lobe 3 mm solid pulmonary nodule (series 6/image 3), stable from 03/08/2016. New small layering bilateral pleural effusions with associated mild compressive atelectasis in the dependent lower lobes. Stable small posterior pericardial effusion/thickening. Hepatobiliary: Normal liver size. Two subcentimeter hypodense liver lesions are too small to characterize and stable. No new liver lesions. Normal gallbladder with no radiopaque cholelithiasis. No biliary ductal dilatation. Pancreas: Normal, with no mass or duct dilation. Spleen: Normal size. No mass. Adrenals/Urinary Tract: Mild diffuse adrenal thickening bilaterally, unchanged, with no discrete adrenal nodules. Mild right hydroureteronephrosis is mildly decreased in the interval. Stable 10 mm stone in the left pelvic ureter approximately 4 cm above the left UVJ. Stable position of left percutaneous nephrostomy tube with the pigtail tip in the left renal pelvis. No residual left hydronephrosis. Two adjacent stones in the left renal pelvis measuring 16 mm in 15 mm. There is diffuse urothelial wall thickening throughout the bilateral renal collecting systems and bilateral ureters, not appreciably changed. There is symmetric bilateral perinephric fat stranding. Subcentimeter hypodense renal cortical lesions in the posterior lower right kidney and mid to upper left kidney are too small to characterize and unchanged. Simple 5.6 cm renal cyst in the lower left kidney. No focal drainable perinephric fluid collections. Bladder is collapsed by indwelling Foley catheter. Gas within the bladder lumen and upper left renal collecting system is consistent with instrumentation. Probable persistent diffuse bladder wall thickening. Stomach/Bowel: Grossly normal stomach. Normal caliber small bowel with no small bowel wall thickening. Normal appendix. Stable mild wall thickening in the cecum. Otherwise unremarkable large bowel, with no additional sites of large bowel wall  thickening. Oral contrast progresses to the rectum. Vascular/Lymphatic: Atherosclerotic nonaneurysmal abdominal aorta. Patent portal, splenic, hepatic and renal veins. Stable mildly enlarged left para-aortic lymph nodes measuring up to 1.0 cm (series 2/ image 43). Reproductive: Stable enlarged myomatous uterus.  No adnexal mass. Other: No free intraperitoneal air. There is an irregular right anterior pelvic fluid collection with thick enhancing wall measuring 9.5 x 3.8 x 2.9 cm (series 5/ image 11), which is intimately associated with the right superior bladder wall and abuts the cecum and right pelvic small bowel loops, and which measured 8.4 x 3.7 x 2.7 cm on the 03/08/2016, mildly increased. Trace pelvic cul-de-sac and perihepatic ascites, minimally increased. Mild anasarca, increased. Stable bilateral ischial decubitus ulcers with soft tissue extending from the skin surface to the ischial  periosteum without discrete bone erosions. Stable sacral decubitus ulcer with soft tissue extending from the skin surface to the lower left sacral periosteum without discrete sacral erosion. No superficial fluid collections. Musculoskeletal: No aggressive appearing focal osseous lesions. Minimal thoracolumbar spondylosis. IMPRESSION: 1. Irregular right anterior pelvic abscess has mildly increased in size. This collection is intimately associated with the right upper bladder wall, cecum and right lower quadrant pelvic small bowel loops. No free air. Appendix appears normal. 2. Persistent urothelial wall thickening throughout the bilateral urinary tracts suggesting bilateral ascending urinary tract infection. Mild right hydroureteronephrosis is decreased and left hydronephrosis is resolved status post Foley catheter placement. Stable left renal pelvis and left pelvic ureteral stones. 3. Increased findings of third-spacing including small increased bilateral pleural effusions, mild anasarca and trace ascites. 4. Stable bilateral  ischial and lower sacral decubitus ulcers. 5. Additional findings include enlarged myomatous uterus, stable mild left para-aortic adenopathy (nonspecific and likely reactive) and mild adrenal hyperplasia. Electronically Signed   By: Ilona Sorrel M.D.   On: 03/10/2016 16:19   Dg Chest Port 1 View  Result Date: 03/10/2016 CLINICAL DATA:  Aspiration. EXAM: PORTABLE CHEST 1 VIEW COMPARISON:  03/08/2016. FINDINGS: Left IJ line in stable position. Mediastinum hilar structures are stable. Heart size stable. Low lung volumes with mild bibasilar atelectasis and/or infiltrates. Small left pleural effusion cannot be excluded. No pneumothorax. Drainage catheter noted over left upper abdomen. IMPRESSION: 1. Left IJ line in stable position. 2. Low lung volumes with mild bibasilar atelectasis and/or infiltrates. Small left pleural effusion cannot be excluded. Electronically Signed   By: Marcello Moores  Register   On: 03/10/2016 07:15      Assessment/Plan Anterior pelvic abscess vs phlegmon - CT: Irregular right anterior pelvic abscess mildly increased in size (9.5 x 3.8 x 2.9 cm) - source unknown, possibly bladder vs bowel - per IR no discrete abscess to drain (appears more phlegmonous) and recommend continuing antibiotics and repeat imaging in 3-4 days - has been on IV antibiotics: Vanco 9/26 (empiric) >> and Zosyn 9/26 (empiric) >> - WBC 14.5 (down from 17.3 yesterday), TMAX 99.4 last 24hr  Multiple Sclerosis (dx 2002) GERD Malnutrition DM with neuropathy CKD III (baseline sr Cr ~ 0.5) Kidney stones DVT (on xarelto) neurogenic bladder with chronic indwelling foley stage 4 decubitus ulcer Anemia HTN   Plan - will discuss with MD.  Jerrye Beavers, PA-C [facility] 03/11/2016, 12:08 PM Pager: [phone] Consults: [phone] Mon-Fri 7:00 am-4:30 pm Sat-Sun 7:00 am-11:30 am

## 2016-03-11 NOTE — Progress Notes (Signed)
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)  No Known Allergies  Patient Measurements: Height: 5\' 5"  (165.1 cm) Weight: 153 lb 7 oz (69.6 kg) IBW/kg (Calculated) : 57 Heparin Dosing Wt: 43kg  Vital Signs: Temp: 99.3 F (37.4 C) (09/29 0800) Temp Source: Oral (09/29 0800) BP: 113/59 (09/29 0700)  Labs:  Recent Labs  03/08/16 1500  03/08/16 2119  03/09/16 0305 03/09/16 0720 03/09/16 1401 03/10/16 0510 03/10/16 0740 03/10/16 1340 03/10/16 1610 03/10/16 2250 03/11/16 0406 03/11/16 0818  HGB  --   --   --   --  7.6*  --   --  6.7*  --  7.2*  --   --  7.3*  --   HCT  --   --   --   --  22.9*  --   --  20.2*  --  21.0*  --   --  21.7*  --   PLT  --   --   --   --  317  --   --  292  --  263  --   --  271  --   APTT  --   < > 45*  --   --  47* 51*  --  39*  --   --   --   --   --   LABPROT  --   --  20.0*  --   --   --   --   --   --   --   --   --  15.2  --   INR  --   --  1.68  --   --   --   --   --   --   --   --   --  1.20  --   HEPARINUNFRC  --   < >  --   --   --  0.70 0.18*  --  0.11* <0.10* <0.10* 0.11*  --  0.22*  CREATININE  --   --   --   < > 1.18*  --   --  0.49  --   --   --   --  0.52  --   TROPONINI 0.03*  --  <0.03  --  <0.03  --   --   --   --   --   --   --   --   --   < > = values in this interval not displayed.  Estimated Creatinine Clearance: 75.9 mL/min (by C-G formula based on SCr of 0.52 mg/dL).   Medications:   Xarelto 20mg  daily PTA- LD 9/25 @ 1700 Infusions:  . sodium chloride 100 mL/hr at 03/11/16 0600  . heparin 1,100 Units/hr (03/11/16 0600)  . phenylephrine (NEO-SYNEPHRINE) Adult infusion Stopped (03/10/16 IC:[id number])    Assessment: 55 yo F on chronic Xarelto for hx VTE which is being held due to acute illness/ renal failure (est CrCl <52ml/min). Last xarelto dose taken on 03/07/16 at 1700.  Today, 03/11/2016: - heparin level improved to 0.22 but still below goal  on 1100units/hr - Per RN, no issue with IV drip, infusing via central line. No bleeding reported/documented. - hgb a little better after transfusion - renal funct has improved   Goal of Therapy:  Heparin level 0.3-0.7 units/ml;  Monitor platelets by anticoagulation protocol: Yes   Plan:   Increase heparin to 1300units/hr  Check heparin level in 6hrs  via peripheral stick  Transition back to Xarelto if interventions complete?  Romeo Rabon, PharmD, pager [phone]. 03/11/2016,9:40 AM.

## 2016-03-11 NOTE — Progress Notes (Signed)
PULMONARY / CRITICAL CARE MEDICINE   Name: [name] MRN: [id number] DOB: 28-Apr-1961    ADMISSION DATE:  03/08/2016 CONSULTATION DATE:  03/08/16  REFERRING MD:  Dr. Tomi Bamberger  CHIEF COMPLAINT:  AMS   HISTORY OF PRESENT ILLNESS:   55 y/o F with PMH of MultipleSclerosis (dx 2002), GERD, malnutrition, DM with neuropathy, CKD III (baseline sr cr ~ 0.5), DVT (**), chronic indwelling foley, stage 4 decubitus ulcer, anemia, HTN and lives at a SNF who presented to [facility] on 9/26 with altered mental status.   Per report, she was found at [facility] with altered mental status / lethargic.  She was last seen normal the evening prior.  EMS reported dried vomit on the patient when the picked her up.  Glucose was wnl. Initial ER evaluation notable for sr cr 2.57 (up from 0.51 01/2016),lactate 5.56, WBC 18.4.  She was noted to have drainage from her decubitus wounds.  She was found to be febrile to 100.4, tachycardic (144) and hypotensive (78/38).  Sepsis therapy activated with fluid resuscitation and empiric antibiotics.  CT abd s/o pelvic abscess? Dome of bladder   SUBJECTIVE:  Off neo Still has abd pain  VITAL SIGNS: BP (!) 113/59   Pulse 80   Temp 99.3 F (37.4 C) (Oral)   Resp (!) 22   Ht 5\' 5"  (1.651 m)   Wt 153 lb 7 oz (69.6 kg)   SpO2 98%   BMI 25.53 kg/m   HEMODYNAMICS: CVP:  [4 mmHg-13 mmHg] 12 mmHg  VENTILATOR SETTINGS:    INTAKE / OUTPUT: I/O last 3 completed shifts: In: [id number] [I.V.:4261; Blood:269; IV Piggyback:932] Out: 3000 [Urine:3000]  PHYSICAL EXAMINATION: General:  Chronically ill appearing,weak Neuro:  Awake, oriented X1.  but moving upper ext to command, bed ridden. HEENT:  [place]/AT, PERRL, EOM-I and MMM. Cardiovascular:  RRR, Nl S1/S2, -M/R/G. Lungs:  CTA bilaterally. Abdomen:  Soft,  Tender bilateral lower quadrant, guarding, ND and +BS. Musculoskeletal: -edema and -tenderness. Skin:  Intact.  LABS:  BMET  Recent Labs Lab 03/09/16 0305 03/10/16 0510  03/11/16 0406  NA 141 140 142  K 3.0* 2.8* 3.6  CL 111 115* 119*  CO2 22 21* 19*  BUN 38* 15 9  CREATININE 1.18* 0.49 0.52  GLUCOSE 131* 81 76    Electrolytes  Recent Labs Lab 03/09/16 0305 03/10/16 0510 03/11/16 0406  CALCIUM 9.2 9.0 9.0  MG 1.8 1.8 1.6*  PHOS 2.8  --  2.0*    CBC  Recent Labs Lab 03/10/16 0510 03/10/16 1340 03/11/16 0406  WBC 16.9* 17.3* 14.5*  HGB 6.7* 7.2* 7.3*  HCT 20.2* 21.0* 21.7*  PLT 292 263 271    Coag's  Recent Labs Lab 03/08/16 2119 03/09/16 0720 03/09/16 1401 03/10/16 0740 03/11/16 0406  APTT 45* 47* 51* 39*  --   INR 1.68  --   --   --  1.20    Sepsis Markers  Recent Labs Lab 03/08/16 1500 03/08/16 1518 03/08/16 2118 03/09/16 0105 03/09/16 0305 03/10/16 0510  LATICACIDVEN  --  5.56* 1.6 1.2  --   --   PROCALCITON 61.85  --   --   --  51.31 25.07    ABG No results for input(s): PHART, PCO2ART, PO2ART in the last 168 hours.  Liver Enzymes  Recent Labs Lab 03/08/16 1240  AST 21  ALT 13*  ALKPHOS 40  BILITOT 0.6  ALBUMIN 3.2*    Cardiac Enzymes  Recent Labs Lab 03/08/16 1500 03/08/16 2119 03/09/16  0305  TROPONINI 0.03* <0.03 <0.03    Glucose  Recent Labs Lab 03/10/16 1607 03/10/16 1635 03/10/16 2026 03/10/16 2342 03/11/16 0352 03/11/16 0803  GLUCAP 60* 96 98 72 82 86    Imaging Ct Abdomen Pelvis W Contrast  Result Date: 03/10/2016 CLINICAL DATA:  55 year old female inpatient with multiple sclerosis admitted with sepsis and draining decubitus ulcers, with suggestion of pelvic abscess on CT study from 2 days prior, presenting for follow-up EXAM: CT ABDOMEN AND PELVIS WITH CONTRAST TECHNIQUE: Multidetector CT imaging of the abdomen and pelvis was performed using the standard protocol following bolus administration of intravenous contrast. CONTRAST:  149mL ISOVUE-300 IOPAMIDOL (ISOVUE-300) INJECTION 61% COMPARISON:  03/08/2016 CT abdomen/ pelvis. FINDINGS: Lower chest: Right middle lobe 3 mm  solid pulmonary nodule (series 6/image 3), stable from 03/08/2016. New small layering bilateral pleural effusions with associated mild compressive atelectasis in the dependent lower lobes. Stable small posterior pericardial effusion/thickening. Hepatobiliary: Normal liver size. Two subcentimeter hypodense liver lesions are too small to characterize and stable. No new liver lesions. Normal gallbladder with no radiopaque cholelithiasis. No biliary ductal dilatation. Pancreas: Normal, with no mass or duct dilation. Spleen: Normal size. No mass. Adrenals/Urinary Tract: Mild diffuse adrenal thickening bilaterally, unchanged, with no discrete adrenal nodules. Mild right hydroureteronephrosis is mildly decreased in the interval. Stable 10 mm stone in the left pelvic ureter approximately 4 cm above the left UVJ. Stable position of left percutaneous nephrostomy tube with the pigtail tip in the left renal pelvis. No residual left hydronephrosis. Two adjacent stones in the left renal pelvis measuring 16 mm in 15 mm. There is diffuse urothelial wall thickening throughout the bilateral renal collecting systems and bilateral ureters, not appreciably changed. There is symmetric bilateral perinephric fat stranding. Subcentimeter hypodense renal cortical lesions in the posterior lower right kidney and mid to upper left kidney are too small to characterize and unchanged. Simple 5.6 cm renal cyst in the lower left kidney. No focal drainable perinephric fluid collections. Bladder is collapsed by indwelling Foley catheter. Gas within the bladder lumen and upper left renal collecting system is consistent with instrumentation. Probable persistent diffuse bladder wall thickening. Stomach/Bowel: Grossly normal stomach. Normal caliber small bowel with no small bowel wall thickening. Normal appendix. Stable mild wall thickening in the cecum. Otherwise unremarkable large bowel, with no additional sites of large bowel wall thickening. Oral  contrast progresses to the rectum. Vascular/Lymphatic: Atherosclerotic nonaneurysmal abdominal aorta. Patent portal, splenic, hepatic and renal veins. Stable mildly enlarged left para-aortic lymph nodes measuring up to 1.0 cm (series 2/ image 43). Reproductive: Stable enlarged myomatous uterus.  No adnexal mass. Other: No free intraperitoneal air. There is an irregular right anterior pelvic fluid collection with thick enhancing wall measuring 9.5 x 3.8 x 2.9 cm (series 5/ image 11), which is intimately associated with the right superior bladder wall and abuts the cecum and right pelvic small bowel loops, and which measured 8.4 x 3.7 x 2.7 cm on the 03/08/2016, mildly increased. Trace pelvic cul-de-sac and perihepatic ascites, minimally increased. Mild anasarca, increased. Stable bilateral ischial decubitus ulcers with soft tissue extending from the skin surface to the ischial periosteum without discrete bone erosions. Stable sacral decubitus ulcer with soft tissue extending from the skin surface to the lower left sacral periosteum without discrete sacral erosion. No superficial fluid collections. Musculoskeletal: No aggressive appearing focal osseous lesions. Minimal thoracolumbar spondylosis. IMPRESSION: 1. Irregular right anterior pelvic abscess has mildly increased in size. This collection is intimately associated with the right upper bladder  wall, cecum and right lower quadrant pelvic small bowel loops. No free air. Appendix appears normal. 2. Persistent urothelial wall thickening throughout the bilateral urinary tracts suggesting bilateral ascending urinary tract infection. Mild right hydroureteronephrosis is decreased and left hydronephrosis is resolved status post Foley catheter placement. Stable left renal pelvis and left pelvic ureteral stones. 3. Increased findings of third-spacing including small increased bilateral pleural effusions, mild anasarca and trace ascites. 4. Stable bilateral ischial and lower  sacral decubitus ulcers. 5. Additional findings include enlarged myomatous uterus, stable mild left para-aortic adenopathy (nonspecific and likely reactive) and mild adrenal hyperplasia. Electronically Signed   By: Ilona Sorrel M.D.   On: 03/10/2016 16:19     STUDIES:  CT abd/ pelvis 9/26 >> Worsening inflammatory changes in the pelvis and right lower quadrant with interval development of peritoneal fluid collections probably representing abscess/phlegmon, and worsening inflammatory wall thickening of cecum, right lower quadrant small bowel loops, and the dome of the bladder. The abscess/phlegmon appears associated with the dome of the bladder Repeat CT abd/pelvis 9/28: 1. Irregular right anterior pelvic abscess has mildly increased in size. This collection is intimately associated with the right upper bladder wall, cecum and right lower quadrant pelvic small bowel loops. No free air. Appendix appears normal. 2. Persistent urothelial wall thickening throughout the bilateral urinary tracts suggesting bilateral ascending urinary tract infection. Mild right hydroureteronephrosis is decreased and left hydronephrosis is resolved status post Foley catheter placement. Stable left renal pelvis and left pelvic ureteral stones. 3. Increased findings of third-spacing including small increased bilateral pleural effusions, mild anasarca and trace ascites. 4. Stable bilateral ischial and lower sacral decubitus ulcers. 5. Additional findings include enlarged myomatous uterus, stable mild left para-aortic adenopathy (nonspecific and likely reactive) and mild adrenal hyperplasia.  CULTURES: BCx2 9/26 >>  UC 9/26 >> mult orgs   ANTIBIOTICS: Vanco 9/26 (empiric) >>  Zosyn 9/26 (empiric) >>  SIGNIFICANT EVENTS: 9/26  Admit from SNF with AMS, concern for sepsis.  Clear CXR.   LINES/TUBES: L IJ TLC 9/26>>>  DISCUSSION: 55 y/o F, SNF resident, with multiple sclerosis admitted on 9/26 with altered mental status  and septic shock.  Potential sources of infection include pelvic abscess, UTI & decubitus ulcers (favor the first 2). She is hemodynamically stable. Repeat imaging demonstrates the pelvic abscess once again but it is not amendable to perc drain. For now we will cont abx, will plan for repeat imaging next week, however given on-going pain we will ask general surgery to see as well. She is stable for SDU status.   ASSESSMENT / PLAN:  PULMONARY A: At Risk Respiratory Failure - in setting of altered mental status Possible Aspiration Event - vomiting prior to admit, FU CXR not impressive P:   Aspiration precautions O2 as needed to support sats >92%   CARDIOVASCULAR A:  Shock - concern for sepsis +/- component of hypovolemia with vomiting Tachycardia - reactive in setting of sepsis (both resolved)  P:  Cont tele Cont abx Goal is to keep euvolemic  RENAL A:   Renal calculi s/p Left PCN AKI - in setting of possible sepsis, volume depletion and chronic disease -resolved CKD III  Elevated Lactate -resolved  Hyperchloremic acidosis  P:   Monitor BMP / UOP  Replace electrolytes as indicated Change IVFs to 1/2 ns  GASTROINTESTINAL A:   Nausea / Vomiting  P:   Clear liquids PRN zofran   HEMATOLOGIC A:   Anemia - suspect of chronic disease, no active bleeding S/p 1  units PRBC P:  Heparin drip in place of xarelto Trend cbc  INFECTIOUS A:   Shock, septic    Pelvic abscess P:   Abx as above  Follow cultures  F/u CT abd next week to follow abscess progress; not amendable to perc. Drain. W/ on-going abd pain will also ask general surgery to eval  ENDOCRINE A:   DM  Diabetic Neuropathy    P:   Trend CBG SSI   NEUROLOGIC A:   Acute Encephalopathy - related to  sepsis, medications, CVA P:    Continue baclofen  FAMILY  - Updates: No family bedside   - Inter-disciplinary family meet or Palliative Care meeting due by:  10/3  Erick Colace ACNP-BC [place] Pager # [phone] OR # [phone] if no answer   03/11/2016

## 2016-03-11 NOTE — Progress Notes (Signed)
Pharmacy Antibiotic Note  [name] is a 55 y.o. female admitted on 03/08/2016 with sepsis.  Pharmacy has been consulted for Vanc/Zosyn dosing.  Plan: Stop Vanc after 5 days total on 9/30. Cont Zosyn 3.375g IV Q8H infused over 4hrs. SCr is stable now and CrCl ~26ml/min. Pharmacy will sign-off abx dosing for now.  Height: 5\' 5"  (165.1 cm) Weight: 153 lb 7 oz (69.6 kg) IBW/kg (Calculated) : 57  Temp (24hrs), Avg:98.9 F (37.2 C), Min:98.7 F (37.1 C), Max:99.3 F (37.4 C)   Recent Labs Lab 03/08/16 1240 03/08/16 1411 03/08/16 1518 03/08/16 2118 03/08/16 2304 03/09/16 0105 03/09/16 0305 03/10/16 0510 03/10/16 1340 03/11/16 0406  WBC  --  18.4*  --   --   --   --  17.6* 16.9* 17.3* 14.5*  CREATININE 2.57*  --   --   --  1.39*  --  1.18* 0.49  --  0.52  LATICACIDVEN  --   --  5.56* 1.6  --  1.2  --   --   --   --     Estimated Creatinine Clearance: 75.9 mL/min (by C-G formula based on SCr of 0.52 mg/dL).    No Known Allergies  Antimicrobials this admission:  Anidulafungin(empiric) 9/26 >> 9/28 Vanc 9/26 >> 9/30 Zosyn 9/26 >>   Dose adjustments this admission:    --  Microbiology results:  8/31 ucx: >100K pseudomonas (pansensitive) 9/26 BCx x2: ngtd 9/26 UCx: mult spcs, suggest recollect 9/26 MRSA PCR (-) 9/26 UA: large leukocytes  Thank you for allowing pharmacy to be a part of this patient's care.  Romeo Rabon, PharmD, pager [phone]. 03/11/2016,10:06 AM.

## 2016-03-11 NOTE — Progress Notes (Signed)
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)  No Known Allergies  Patient Measurements: Height: 5\' 5"  (165.1 cm) Weight: 153 lb 7 oz (69.6 kg) IBW/kg (Calculated) : 57 Heparin Dosing Wt: 43kg  Vital Signs: Temp: 99.4 F (37.4 C) (09/29 1200) Temp Source: Oral (09/29 1200) BP: 110/45 (09/29 1000)  Labs:  Recent Labs  03/08/16 2119  03/09/16 0305 03/09/16 0720 03/09/16 1401 03/10/16 0510 03/10/16 0740 03/10/16 1340  03/10/16 2250 03/11/16 0406 03/11/16 0818 03/11/16 1600  HGB  --   < > 7.6*  --   --  6.7*  --  7.2*  --   --  7.3*  --   --   HCT  --   < > 22.9*  --   --  20.2*  --  21.0*  --   --  21.7*  --   --   PLT  --   < > 317  --   --  292  --  263  --   --  271  --   --   APTT 45*  --   --  47* 51*  --  39*  --   --   --   --   --   --   LABPROT 20.0*  --   --   --   --   --   --   --   --   --  15.2  --   --   INR 1.68  --   --   --   --   --   --   --   --   --  1.20  --   --   HEPARINUNFRC  --   --   --  0.70 0.18*  --  0.11* <0.10*  < > 0.11*  --  0.22* 0.20*  CREATININE  --   < > 1.18*  --   --  0.49  --   --   --   --  0.52  --   --   TROPONINI <0.03  --  <0.03  --   --   --   --   --   --   --   --   --   --   < > = values in this interval not displayed.  Estimated Creatinine Clearance: 75.9 mL/min (by C-G formula based on SCr of 0.52 mg/dL).   Medications:   Xarelto 20mg  daily PTA- LD 9/25 @ 1700 Infusions:  . sodium chloride 50 mL/hr at 03/11/16 1529  . heparin 1,300 Units/hr (03/11/16 1528)  . heparin    . phenylephrine (NEO-SYNEPHRINE) Adult infusion Stopped (03/10/16 IC:[id number])    Assessment: 55 yo F on chronic Xarelto for hx VTE which is being held due to acute illness/ renal failure (est CrCl <42ml/min). Last xarelto dose taken on 03/07/16 at 1700.  Today, 03/11/2016:  Heparin level remains subtherapeutic despite multiple rate increases  Per RN, no issue with IV drip, infusing via  central line. No bleeding reported/documented.  Hgb this AM improved slightly after transfusion 9/28  renal funct has improved   Goal of Therapy:  Heparin level 0.3-0.7 units/ml;  Monitor platelets by anticoagulation protocol: Yes   Plan:   Heparin 1000 units IV bolus  Increase heparin IV infusion to 1450 units/hr  Check heparin level in 6hrs via peripheral stick  Transition back to Xarelto if interventions complete?  Peggyann Juba, PharmD, BCPS Pager:  [phone] 03/11/2016,4:57 PM.

## 2016-03-12 DIAGNOSIS — L8990 Pressure ulcer of unspecified site, unspecified stage: Secondary | ICD-10-CM | POA: Diagnosis present

## 2016-03-12 LAB — BASIC METABOLIC PANEL
ANION GAP: 3 — AB (ref 5–15)
BUN: 7 mg/dL (ref 6–20)
CHLORIDE: 118 mmol/L — AB (ref 101–111)
CO2: 22 mmol/L (ref 22–32)
Calcium: 8.7 mg/dL — ABNORMAL LOW (ref 8.9–10.3)
Creatinine, Ser: 0.5 mg/dL (ref 0.44–1.00)
GFR calc non Af Amer: >60 mL/min (ref >60)
Glucose, Bld: 146 mg/dL — ABNORMAL HIGH (ref 65–99)
POTASSIUM: 2.8 mmol/L — AB (ref 3.5–5.1)
SODIUM: 143 mmol/L (ref 135–145)

## 2016-03-12 LAB — HEPARIN LEVEL (UNFRACTIONATED): Heparin Unfractionated: 0.36 IU/mL (ref 0.30–0.70)

## 2016-03-12 LAB — GLUCOSE, CAPILLARY
GLUCOSE-CAPILLARY: 151 mg/dL — AB (ref 65–99)
GLUCOSE-CAPILLARY: 119 mg/dL — AB (ref 65–99)
GLUCOSE-CAPILLARY: 238 mg/dL — AB (ref 65–99)
GLUCOSE-CAPILLARY: 156 mg/dL — AB (ref 65–99)
Glucose-Capillary: 169 mg/dL — ABNORMAL HIGH (ref 65–99)

## 2016-03-12 LAB — CBC
HEMATOCRIT: 21.3 % — AB (ref 36.0–46.0)
HEMOGLOBIN: 7.2 g/dL — AB (ref 12.0–15.0)
MCH: 28.9 pg (ref 26.0–34.0)
MCHC: 33.8 g/dL (ref 30.0–36.0)
MCV: 85.5 fL (ref 78.0–100.0)
Platelets: 280 10*3/uL (ref 150–400)
RBC: 2.49 MIL/uL — ABNORMAL LOW (ref 3.87–5.11)
RDW: 15.9 % — ABNORMAL HIGH (ref 11.5–15.5)
WBC: 13.2 10*3/uL — AB (ref 4.0–10.5)

## 2016-03-12 LAB — POTASSIUM: Potassium: 3.5 mmol/L (ref 3.5–5.1)

## 2016-03-12 LAB — MAGNESIUM: MAGNESIUM: 1.4 mg/dL — AB (ref 1.7–2.4)

## 2016-03-12 MED ORDER — POTASSIUM CHLORIDE 10 MEQ/50ML IV SOLN
INTRAVENOUS | Status: AC
  Filled 2016-03-12: qty 50

## 2016-03-12 MED ORDER — POTASSIUM CHLORIDE CRYS ER 20 MEQ PO TBCR
40.0000 meq | EXTENDED_RELEASE_TABLET | Freq: Once | ORAL | Status: AC
  Administered 2016-03-12: 40 meq via ORAL
  Filled 2016-03-12: qty 2

## 2016-03-12 MED ORDER — MAGNESIUM SULFATE 50 % IJ SOLN
3.0000 g | Freq: Once | INTRAMUSCULAR | Status: AC
  Administered 2016-03-12: 3 g via INTRAVENOUS
  Filled 2016-03-12: qty 6

## 2016-03-12 MED ORDER — VITAMINS A & D EX OINT
TOPICAL_OINTMENT | CUTANEOUS | Status: AC
  Administered 2016-03-12: 06:00:00
  Filled 2016-03-12: qty 5

## 2016-03-12 MED ORDER — POTASSIUM CHLORIDE 10 MEQ/50ML IV SOLN
10.0000 meq | INTRAVENOUS | Status: AC
  Administered 2016-03-12 (×6): 10 meq via INTRAVENOUS
  Filled 2016-03-12 (×4): qty 50

## 2016-03-12 NOTE — Progress Notes (Signed)
PROGRESS NOTE    [name]  [id number] DOB: April 14, 1961 DOA: 03/08/2016 PCP: No primary care provider on file.    Brief Narrative: 55 y/o F, SNF resident, with multiple sclerosis admitted on 9/26 with altered mental status and septic shock.  Potential sources of infection include pelvic abscess, UTI & decubitus ulcers (favor the first 2). She is hemodynamically stable. Repeat imaging demonstrates the pelvic abscess once again but it is not amendable to perc drain. For now we will cont abx, will plan for repeat imaging next week, however given on-going pain we will ask general surgery to see as well.    Assessment & Plan:   Active Problems:   Septic shock (HCC)   Pressure injury of skin   Septic shock:  From the pelvic abscess, requiring vasopressors. curently off pressors and transferred to [facility] service from PCCM on 9/30. Resume IV zosyn.   Acute Kidney Injury on stage 3 CKD:: From volume depletion. Improving.   Anemia: secondary to chronic disease.  S/p 1 UNIT of prbc transfusion.  Currently 7.2.  Ordered 1 unit of prbc transfusion.   Pelvic abscess: Will need repeat CT for further evaluation.  Surgery following the patient.    Hypokalemia and hypomagnesemia: - replete as needed.    Leukocytosis: From the abscess.  Monitor.   Hypertension:  bp well controlled.    Diabetes Mellitus: CBG (last 3)   Recent Labs  03/12/16 0804 03/12/16 1252 03/12/16 1644  GLUCAP 119* 238* 156*    Resume SSI.     DVT: On IV heparin.    DVT prophylaxis: HEPARIN Code Status: full code.  Family Communication: none at bedside.  Disposition Plan: pending further eval.    Consultants:    surgery.   PCCM till 9/29    Procedures: L IJ TLC 9/26>>>   Antimicrobials:  CULTURES: BCx2 9/26 >>  UC 9/26 >> mult orgs   ANTIBIOTICS: Vanco 9/26 (empiric) >>  Zosyn 9/26 (empiric) >>   Subjective: Reports some abd pain.   Objective: Vitals:   03/12/16  0404 03/12/16 0600 03/12/16 0800 03/12/16 1200  BP:      Pulse:      Resp:      Temp: 98.7 F (37.1 C)  97.6 F (36.4 C) 99.2 F (37.3 C)  TempSrc: Oral  Oral Oral  SpO2:      Weight:  71.7 kg (158 lb 1.1 oz)    Height:        Intake/Output Summary (Last 24 hours) at 03/12/16 1626 Last data filed at 03/12/16 1500  Gross per 24 hour  Intake          1165.52 ml  Output             1475 ml  Net          -309.48 ml   Filed Weights   03/10/16 0315 03/11/16 0414 03/12/16 0600  Weight: 66.8 kg (147 lb 4.3 oz) 69.6 kg (153 lb 7 oz) 71.7 kg (158 lb 1.1 oz)    Examination:  General exam: Appears calm and comfortable  Respiratory system: Clear to auscultation. Respiratory effort normal. Cardiovascular system: S1 & S2 heard, RRR. No JVD, murmurs, rubs, gallops or clicks. No pedal edema. Gastrointestinal system: Abdomen is mildly tender, soft. No organomegaly or masses felt. Normal bowel sounds heard. Central nervous system: Alert and oriented. No focal neurological deficits. Extremities: Symmetric 5 x 5 power. Skin: No rashes, lesions or ulcers Psychiatry: Judgement and insight appear normal. Mood &  affect appropriate.     Data Reviewed: I have personally reviewed following labs and imaging studies  CBC:  Recent Labs Lab 03/09/16 0305 03/10/16 0510 03/10/16 1340 03/11/16 0406 03/12/16 0445  WBC 17.6* 16.9* 17.3* 14.5* 13.2*  NEUTROABS  --   --  14.2*  --   --   HGB 7.6* 6.7* 7.2* 7.3* 7.2*  HCT 22.9* 20.2* 21.0* 21.7* 21.3*  MCV 87.7 87.8 86.4 88.9 85.5  PLT 317 292 263 271 [id number]   Basic Metabolic Panel:  Recent Labs Lab 03/08/16 2304 03/09/16 0305 03/10/16 0510 03/11/16 0406 03/12/16 0445 03/12/16 1020  NA 140 141 140 142 143  --   K 3.2* 3.0* 2.8* 3.6 2.8*  --   CL 112* 111 115* 119* 118*  --   CO2 21* 22 21* 19* 22  --   GLUCOSE 122* 131* 81 76 146*  --   BUN 42* 38* 15 9 7   --   CREATININE 1.39* 1.18* 0.49 0.52 0.50  --   CALCIUM 9.0 9.2 9.0 9.0 8.7*   --   MG  --  1.8 1.8 1.6*  --  1.4*  PHOS  --  2.8  --  2.0*  --   --    GFR: Estimated Creatinine Clearance: 77 mL/min (by C-G formula based on SCr of 0.5 mg/dL). Liver Function Tests:  Recent Labs Lab 03/08/16 1240  AST 21  ALT 13*  ALKPHOS 40  BILITOT 0.6  PROT 7.7  ALBUMIN 3.2*   No results for input(s): LIPASE, AMYLASE in the last 168 hours. No results for input(s): AMMONIA in the last 168 hours. Coagulation Profile:  Recent Labs Lab 03/08/16 2119 03/11/16 0406  INR 1.68 1.20   Cardiac Enzymes:  Recent Labs Lab 03/08/16 1500 03/08/16 2119 03/09/16 0305  TROPONINI 0.03* <0.03 <0.03   BNP (last 3 results) No results for input(s): PROBNP in the last 8760 hours. HbA1C: No results for input(s): HGBA1C in the last 72 hours. CBG:  Recent Labs Lab 03/11/16 1613 03/11/16 2010 03/11/16 2338 03/12/16 0804 03/12/16 1252  GLUCAP 164* 159* 172* 119* 238*   Lipid Profile: No results for input(s): CHOL, HDL, LDLCALC, TRIG, CHOLHDL, LDLDIRECT in the last 72 hours. Thyroid Function Tests: No results for input(s): TSH, T4TOTAL, FREET4, T3FREE, THYROIDAB in the last 72 hours. Anemia Panel: No results for input(s): VITAMINB12, FOLATE, FERRITIN, TIBC, IRON, RETICCTPCT in the last 72 hours. Sepsis Labs:  Recent Labs Lab 03/08/16 1500 03/08/16 1518 03/08/16 2118 03/09/16 0105 03/09/16 0305 03/10/16 0510  PROCALCITON 61.85  --   --   --  51.31 25.07  LATICACIDVEN  --  5.56* 1.6 1.2  --   --     Recent Results (from the past 240 hour(s))  Blood Culture (routine x 2)     Status: None (Preliminary result)   Collection Time: 03/08/16 12:40 PM  Result Value Ref Range Status   Specimen Description BLOOD RIGHT ARM  Final   Special Requests BOTTLES DRAWN AEROBIC AND ANAEROBIC 5CC  Final   Culture   Final    NO GROWTH 4 DAYS Performed at [facility]    Report Status PENDING  Incomplete  Urine culture     Status: Abnormal   Collection Time: 03/08/16 12:40  PM  Result Value Ref Range Status   Specimen Description URINE, CLEAN CATCH  Final   Special Requests NONE  Final   Culture MULTIPLE SPECIES PRESENT, SUGGEST RECOLLECTION (A)  Final   Report Status 03/10/2016  FINAL  Final  Blood Culture (routine x 2)     Status: None (Preliminary result)   Collection Time: 03/08/16  2:12 PM  Result Value Ref Range Status   Specimen Description BLOOD RIGHT ANTECUBITAL  Final   Special Requests BOTTLES DRAWN AEROBIC ONLY 2ML  Final   Culture   Final    NO GROWTH 4 DAYS Performed at [facility]    Report Status PENDING  Incomplete  MRSA PCR Screening     Status: None   Collection Time: 03/08/16  6:16 PM  Result Value Ref Range Status   MRSA by PCR NEGATIVE NEGATIVE Final    Comment:        The GeneXpert MRSA Assay (FDA approved for NASAL specimens only), is one component of a comprehensive MRSA colonization surveillance program. It is not intended to diagnose MRSA infection nor to guide or monitor treatment for MRSA infections.          Radiology Studies: No results found.      Scheduled Meds: . sodium chloride   Intravenous Once  . atorvastatin  10 mg Oral q1800  . baclofen  10 mg Oral TID  . chlorhexidine  15 mL Mouth Rinse BID  . famotidine  20 mg Oral Daily  . feeding supplement  1 Container Oral BID BM  . insulin aspart  0-9 Units Subcutaneous Q4H  . mouth rinse  15 mL Mouth Rinse q12n4p  . piperacillin-tazobactam (ZOSYN)  IV  3.375 g Intravenous Q8H  . vancomycin  500 mg Intravenous Q12H   Continuous Infusions: . sodium chloride 50 mL/hr at 03/12/16 0700  . heparin 1,450 Units/hr (03/12/16 0700)  . phenylephrine (NEO-SYNEPHRINE) Adult infusion Stopped (03/10/16 0837)     LOS: 4 days    Time spent: 25 minutes.     Hosie Poisson, MD [facility] Pager [phone]  If 7PM-7AM, please contact night-coverage [email or web address] Password [email or web address] 03/12/2016, 4:26 PM

## 2016-03-12 NOTE — Progress Notes (Signed)
CSW consulted to assist with d/c planning. CSW has made several attempts to see pt but has been unsuccessful due to pt being either a sleep or receiving care. Pt is from [facility]. CSW will continue to follow and assist with d/c planning needs.  Werner Lean LCSW [phone]

## 2016-03-12 NOTE — Progress Notes (Signed)
General Surgery [facility]  03/12/2016  Assessment & Plan: Abdominal pain, Intra-abdominal fluid collection  Afebrile, WBC improved 13.2  Remains tender RLQ abdomen on exam  Tolerating diet, having BM's  Continue IV abx, repeat CT scan early this week  Will follow        Earnstine Regal, MD, [facility]       Office: [phone]    Subjective: Patient in bed, alert, nurse at bedside.  No complaints of abd pain.  Having BM's.  No nausea.  Objective: Vital signs in last 24 hours: Temp:  [97.6 F (36.4 C)-100.2 F (37.9 C)] 97.6 F (36.4 C) (09/30 0800) Resp:  [21-26] 21 (09/30 0400) BP: (102-124)/(44-73) 110/73 (09/30 0400) SpO2:  [99 %-100 %] 100 % (09/30 0400) Weight:  [71.7 kg (158 lb 1.1 oz)] 71.7 kg (158 lb 1.1 oz) (09/30 0600) Last BM Date: 03/12/16  Intake/Output from previous day: 09/29 0701 - 09/30 0700 In: 2154.6 [I.V.:1744.6; IV Piggyback:400] Out: 1875 [Urine:1875] Intake/Output this shift: No intake/output data recorded.  Physical Exam: HEENT - sclerae clear, mucous membranes moist Abdomen - mild distension; BS present; mild to moderate tenderness RLQ with voluntary guarding  Lab Results:   Recent Labs  03/11/16 0406 03/12/16 0445  WBC 14.5* 13.2*  HGB 7.3* 7.2*  HCT 21.7* 21.3*  PLT 271 280   BMET  Recent Labs  03/11/16 0406 03/12/16 0445  NA 142 143  K 3.6 2.8*  CL 119* 118*  CO2 19* 22  GLUCOSE 76 146*  BUN 9 7  CREATININE 0.52 0.50  CALCIUM 9.0 8.7*   PT/INR  Recent Labs  03/11/16 0406  LABPROT 15.2  INR 1.20   Comprehensive Metabolic Panel:    Component Value Date/Time   NA 143 03/12/2016 0445   NA 142 03/11/2016 0406   NA 139 01/06/2016   NA 139 10/16/2015   K 2.8 (L) 03/12/2016 0445   K 3.6 03/11/2016 0406   CL 118 (H) 03/12/2016 0445   CL 119 (H) 03/11/2016 0406   CO2 22 03/12/2016 0445   CO2 19 (L) 03/11/2016 0406   BUN 7 03/12/2016 0445   BUN 9  03/11/2016 0406   BUN 19 01/06/2016   BUN 27 (A) 10/16/2015   CREATININE 0.50 03/12/2016 0445   CREATININE 0.52 03/11/2016 0406   GLUCOSE 146 (H) 03/12/2016 0445   GLUCOSE 76 03/11/2016 0406   CALCIUM 8.7 (L) 03/12/2016 0445   CALCIUM 9.0 03/11/2016 0406   AST 21 03/08/2016 1240   AST 12 (A) 01/06/2016   ALT 13 (L) 03/08/2016 1240   ALT 17 01/06/2016   ALKPHOS 40 03/08/2016 1240   ALKPHOS 67 01/06/2016   BILITOT 0.6 03/08/2016 1240   BILITOT 0.5 12/19/2015 0744   PROT 7.7 03/08/2016 1240   PROT 6.6 12/19/2015 0744   ALBUMIN 3.2 (L) 03/08/2016 1240   ALBUMIN 2.3 (L) 12/19/2015 0744    Studies/Results: Ct Abdomen Pelvis W Contrast  Result Date: 03/10/2016 CLINICAL DATA:  55 year old female inpatient with multiple sclerosis admitted with sepsis and draining decubitus ulcers, with suggestion of pelvic abscess on CT study from 2 days prior, presenting for follow-up EXAM: CT ABDOMEN AND PELVIS WITH CONTRAST TECHNIQUE: Multidetector CT imaging of the abdomen and pelvis was performed using the standard protocol following bolus administration of intravenous contrast. CONTRAST:  143mL ISOVUE-300 IOPAMIDOL (ISOVUE-300) INJECTION 61% COMPARISON:  03/08/2016 CT abdomen/ pelvis. FINDINGS: Lower chest: Right middle lobe 3  mm solid pulmonary nodule (series 6/image 3), stable from 03/08/2016. New small layering bilateral pleural effusions with associated mild compressive atelectasis in the dependent lower lobes. Stable small posterior pericardial effusion/thickening. Hepatobiliary: Normal liver size. Two subcentimeter hypodense liver lesions are too small to characterize and stable. No new liver lesions. Normal gallbladder with no radiopaque cholelithiasis. No biliary ductal dilatation. Pancreas: Normal, with no mass or duct dilation. Spleen: Normal size. No mass. Adrenals/Urinary Tract: Mild diffuse adrenal thickening bilaterally, unchanged, with no discrete adrenal nodules. Mild right  hydroureteronephrosis is mildly decreased in the interval. Stable 10 mm stone in the left pelvic ureter approximately 4 cm above the left UVJ. Stable position of left percutaneous nephrostomy tube with the pigtail tip in the left renal pelvis. No residual left hydronephrosis. Two adjacent stones in the left renal pelvis measuring 16 mm in 15 mm. There is diffuse urothelial wall thickening throughout the bilateral renal collecting systems and bilateral ureters, not appreciably changed. There is symmetric bilateral perinephric fat stranding. Subcentimeter hypodense renal cortical lesions in the posterior lower right kidney and mid to upper left kidney are too small to characterize and unchanged. Simple 5.6 cm renal cyst in the lower left kidney. No focal drainable perinephric fluid collections. Bladder is collapsed by indwelling Foley catheter. Gas within the bladder lumen and upper left renal collecting system is consistent with instrumentation. Probable persistent diffuse bladder wall thickening. Stomach/Bowel: Grossly normal stomach. Normal caliber small bowel with no small bowel wall thickening. Normal appendix. Stable mild wall thickening in the cecum. Otherwise unremarkable large bowel, with no additional sites of large bowel wall thickening. Oral contrast progresses to the rectum. Vascular/Lymphatic: Atherosclerotic nonaneurysmal abdominal aorta. Patent portal, splenic, hepatic and renal veins. Stable mildly enlarged left para-aortic lymph nodes measuring up to 1.0 cm (series 2/ image 43). Reproductive: Stable enlarged myomatous uterus.  No adnexal mass. Other: No free intraperitoneal air. There is an irregular right anterior pelvic fluid collection with thick enhancing wall measuring 9.5 x 3.8 x 2.9 cm (series 5/ image 11), which is intimately associated with the right superior bladder wall and abuts the cecum and right pelvic small bowel loops, and which measured 8.4 x 3.7 x 2.7 cm on the 03/08/2016, mildly  increased. Trace pelvic cul-de-sac and perihepatic ascites, minimally increased. Mild anasarca, increased. Stable bilateral ischial decubitus ulcers with soft tissue extending from the skin surface to the ischial periosteum without discrete bone erosions. Stable sacral decubitus ulcer with soft tissue extending from the skin surface to the lower left sacral periosteum without discrete sacral erosion. No superficial fluid collections. Musculoskeletal: No aggressive appearing focal osseous lesions. Minimal thoracolumbar spondylosis. IMPRESSION: 1. Irregular right anterior pelvic abscess has mildly increased in size. This collection is intimately associated with the right upper bladder wall, cecum and right lower quadrant pelvic small bowel loops. No free air. Appendix appears normal. 2. Persistent urothelial wall thickening throughout the bilateral urinary tracts suggesting bilateral ascending urinary tract infection. Mild right hydroureteronephrosis is decreased and left hydronephrosis is resolved status post Foley catheter placement. Stable left renal pelvis and left pelvic ureteral stones. 3. Increased findings of third-spacing including small increased bilateral pleural effusions, mild anasarca and trace ascites. 4. Stable bilateral ischial and lower sacral decubitus ulcers. 5. Additional findings include enlarged myomatous uterus, stable mild left para-aortic adenopathy (nonspecific and likely reactive) and mild adrenal hyperplasia. Electronically Signed   By: Ilona Sorrel M.D.   On: 03/10/2016 16:19      Karli Wickizer M 03/12/2016  Patient ID: Butch Penny  Wende Neighbors, female   DOB: Mar 03, 1961, 5 y.o.   MRN: [id number]

## 2016-03-12 NOTE — Progress Notes (Signed)
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)  No Known Allergies  Patient Measurements: Height: 5\' 5"  (165.1 cm) Weight: 153 lb 7 oz (69.6 kg) IBW/kg (Calculated) : 57 Heparin Dosing Weight:   Vital Signs: Temp: 98.6 F (37 C) (09/29 2339) Temp Source: Oral (09/29 2339) BP: 120/49 (09/30 0000)  Labs:  Recent Labs  03/09/16 0720 03/09/16 1401  03/10/16 0510 03/10/16 0740 03/10/16 1340  03/11/16 0406 03/11/16 0818 03/11/16 1600 03/11/16 2300  HGB  --   --   < > 6.7*  --  7.2*  --  7.3*  --   --   --   HCT  --   --   --  20.2*  --  21.0*  --  21.7*  --   --   --   PLT  --   --   --  292  --  263  --  271  --   --   --   APTT 47* 51*  --   --  39*  --   --   --   --   --   --   LABPROT  --   --   --   --   --   --   --  15.2  --   --   --   INR  --   --   --   --   --   --   --  1.20  --   --   --   HEPARINUNFRC 0.70 0.18*  --   --  0.11* <0.10*  < >  --  0.22* 0.20* 0.33  CREATININE  --   --   --  0.49  --   --   --  0.52  --   --   --   < > = values in this interval not displayed.  Estimated Creatinine Clearance: 75.9 mL/min (by C-G formula based on SCr of 0.52 mg/dL).   Medications:  Infusions:  . sodium chloride 50 mL/hr at 03/12/16 0000  . heparin Stopped (03/11/16 1702)  . heparin 1,450 Units/hr (03/12/16 0000)  . phenylephrine (NEO-SYNEPHRINE) Adult infusion Stopped (03/10/16 [id number])    Assessment: Patient with heparin level at goal.  No heparin issues noted.  Goal of Therapy:  Heparin level 0.3-0.7 units/ml Monitor platelets by anticoagulation protocol: Yes   Plan:  Continue heparin drip at current rate Recheck level with AM labs  Tyler Deis, Shea Stakes Crowford 03/12/2016,3:28 AM

## 2016-03-12 NOTE — Progress Notes (Signed)
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)  No Known Allergies  Patient Measurements: Height: 5\' 5"  (165.1 cm) Weight: 158 lb 1.1 oz (71.7 kg) IBW/kg (Calculated) : 57 Heparin Dosing Wt: 43kg  Vital Signs: Temp: 97.6 F (36.4 C) (09/30 0800) Temp Source: Oral (09/30 0800) BP: 110/73 (09/30 0400)  Labs:  Recent Labs  03/09/16 1401  03/10/16 0510 03/10/16 0740 03/10/16 1340  03/11/16 0406  03/11/16 1600 03/11/16 2300 03/12/16 0445  HGB  --   < > 6.7*  --  7.2*  --  7.3*  --   --   --  7.2*  HCT  --   < > 20.2*  --  21.0*  --  21.7*  --   --   --  21.3*  PLT  --   < > 292  --  263  --  271  --   --   --  280  APTT 51*  --   --  39*  --   --   --   --   --   --   --   LABPROT  --   --   --   --   --   --  15.2  --   --   --   --   INR  --   --   --   --   --   --  1.20  --   --   --   --   HEPARINUNFRC 0.18*  --   --  0.11* <0.10*  < >  --   < > 0.20* 0.33 0.36  CREATININE  --   --  0.49  --   --   --  0.52  --   --   --  0.50  < > = values in this interval not displayed.  Estimated Creatinine Clearance: 77 mL/min (by C-G formula based on SCr of 0.5 mg/dL).   Medications:   Xarelto 20mg  daily PTA- LD 9/25 @ 1700 Infusions:  . sodium chloride 50 mL/hr at 03/12/16 0700  . heparin 1,450 Units/hr (03/12/16 0700)  . 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 Last xarelto dose taken on 03/07/16 at 1700.  Today, 03/12/2016:  Heparin level up into therapeutic range overnight on 1450units/hr.   Per RN, no issue with IV drip, infusing via central line. No bleeding reported/documented.  Hgb this AM improved slightly after transfusion 9/28  Renal funct has improved   Tolerating diet  Goal of Therapy:  Heparin level 0.3-0.7 units/ml;  Monitor platelets  by anticoagulation protocol: Yes   Plan:   Cont heparin IV infusion at 1450units/hr  Check heparin level daily  Transition back to Xarelto if interventions complete?  Romeo Rabon, PharmD, pager [phone]. 03/12/2016,8:43 AM.

## 2016-03-13 DIAGNOSIS — L89152 Pressure ulcer of sacral region, stage 2: Secondary | ICD-10-CM

## 2016-03-13 DIAGNOSIS — R5381 Other malaise: Secondary | ICD-10-CM

## 2016-03-13 LAB — BASIC METABOLIC PANEL
Anion gap: 4 — ABNORMAL LOW (ref 5–15)
BUN: 5 mg/dL — AB (ref 6–20)
CHLORIDE: 115 mmol/L — AB (ref 101–111)
CO2: 21 mmol/L — AB (ref 22–32)
CREATININE: 0.34 mg/dL — AB (ref 0.44–1.00)
Calcium: 8.8 mg/dL — ABNORMAL LOW (ref 8.9–10.3)
GFR calc Af Amer: >60 mL/min (ref >60)
GFR calc non Af Amer: >60 mL/min (ref >60)
GLUCOSE: 103 mg/dL — AB (ref 65–99)
POTASSIUM: 3.1 mmol/L — AB (ref 3.5–5.1)
Sodium: 140 mmol/L (ref 135–145)

## 2016-03-13 LAB — CBC
HEMATOCRIT: 21.4 % — AB (ref 36.0–46.0)
Hemoglobin: 7.2 g/dL — ABNORMAL LOW (ref 12.0–15.0)
MCH: 29.5 pg (ref 26.0–34.0)
MCHC: 33.6 g/dL (ref 30.0–36.0)
MCV: 87.7 fL (ref 78.0–100.0)
Platelets: 302 10*3/uL (ref 150–400)
RBC: 2.44 MIL/uL — ABNORMAL LOW (ref 3.87–5.11)
RDW: 15.7 % — AB (ref 11.5–15.5)
WBC: 12.1 10*3/uL — ABNORMAL HIGH (ref 4.0–10.5)

## 2016-03-13 LAB — HEPARIN LEVEL (UNFRACTIONATED)
HEPARIN UNFRACTIONATED: 0.33 [IU]/mL (ref 0.30–0.70)
Heparin Unfractionated: 0.3 IU/mL (ref 0.30–0.70)

## 2016-03-13 LAB — GLUCOSE, CAPILLARY
GLUCOSE-CAPILLARY: 163 mg/dL — AB (ref 65–99)
GLUCOSE-CAPILLARY: 157 mg/dL — AB (ref 65–99)
Glucose-Capillary: 103 mg/dL — ABNORMAL HIGH (ref 65–99)
Glucose-Capillary: 110 mg/dL — ABNORMAL HIGH (ref 65–99)
Glucose-Capillary: 112 mg/dL — ABNORMAL HIGH (ref 65–99)
Glucose-Capillary: 155 mg/dL — ABNORMAL HIGH (ref 65–99)

## 2016-03-13 LAB — CULTURE, BLOOD (ROUTINE X 2)
Culture: NO GROWTH
Culture: NO GROWTH

## 2016-03-13 LAB — MAGNESIUM: Magnesium: 1.9 mg/dL (ref 1.7–2.4)

## 2016-03-13 LAB — PREPARE RBC (CROSSMATCH)

## 2016-03-13 MED ORDER — ENOXAPARIN SODIUM 80 MG/0.8ML ~~LOC~~ SOLN
75.0000 mg | Freq: Two times a day (BID) | SUBCUTANEOUS | Status: DC
  Administered 2016-03-14 – 2016-03-15 (×2): 75 mg via SUBCUTANEOUS
  Filled 2016-03-13 (×3): qty 0.8

## 2016-03-13 MED ORDER — SODIUM CHLORIDE 0.9 % IV SOLN
Freq: Once | INTRAVENOUS | Status: AC
  Administered 2016-03-13: 16:00:00 via INTRAVENOUS

## 2016-03-13 MED ORDER — ENOXAPARIN SODIUM 80 MG/0.8ML ~~LOC~~ SOLN
75.0000 mg | Freq: Once | SUBCUTANEOUS | Status: AC
  Administered 2016-03-13: 75 mg via SUBCUTANEOUS
  Filled 2016-03-13: qty 0.8

## 2016-03-13 NOTE — Progress Notes (Signed)
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)  No Known Allergies  Patient Measurements: Height: 5\' 5"  (165.1 cm) Weight: 164 lb 3.9 oz (74.5 kg) IBW/kg (Calculated) : 57 Heparin Dosing Wt: 72 kg  Vital Signs: Temp: 99 F (37.2 C) (10/01 0600) Temp Source: Oral (10/01 0600) BP: 133/60 (10/01 0600)  Labs:  Recent Labs  03/11/16 0406  03/11/16 2300 03/12/16 0445 03/13/16 0345  HGB 7.3*  --   --  7.2* 7.2*  HCT 21.7*  --   --  21.3* 21.4*  PLT 271  --   --  280 302  LABPROT 15.2  --   --   --   --   INR 1.20  --   --   --   --   HEPARINUNFRC  --   < > 0.33 0.36 0.30  CREATININE 0.52  --   --  0.50 0.34*  < > = values in this interval not displayed.  Estimated Creatinine Clearance: 78.4 mL/min (by C-G formula based on SCr of 0.34 mg/dL (L)).   Medications:   PTA Xarelto 20mg  daily - LD 9/25 @ 1700 Infusions:  . sodium chloride 50 mL/hr at 03/12/16 2322  . heparin 1,450 Units/hr (03/12/16 2310)  . phenylephrine (NEO-SYNEPHRINE) Adult infusion Stopped (03/10/16 [id number])    Assessment: 55 yo F on chronic Xarelto for hx VTE which is being held due to acute illness/ renal failure (est CrCl <4ml/min). Last xarelto dose taken on 03/07/16 at 1700.  Today, 03/13/2016:  Heparin 0.3, remains therapeutic at low end of range.     Per RN, no issue with IV drip, infusing via central line. No bleeding reported/documented.  Hgb remains low/stable today (improved slightly after transfusion 9/28); Plt remain WNL  Renal funct has improved, CrCl ~ 78 ml/min  Tolerating diet, no nausea, having BMs.   Goal of Therapy:  Heparin level 0.3-0.7 units/ml;  Monitor platelets by anticoagulation protocol: Yes   Plan:   Cont heparin IV infusion at 1450units/hr  Recheck HL in 12 hours to confirm therapeutic level.  Check CBC and heparin level daily  Transition back to Xarelto if interventions complete?  Noted plan for repeat CT  scan early this coming week to re-evaluate intra-abdominal fluid collection.   Gretta Arab PharmD, BCPS Pager [phone] 03/13/2016 8:41 AM

## 2016-03-13 NOTE — Progress Notes (Signed)
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)  No Known Allergies  Patient Measurements: Height: 5\' 5"  (165.1 cm) Weight: 164 lb 3.9 oz (74.5 kg) IBW/kg (Calculated) : 57 Heparin Dosing Wt: 72 kg  Vital Signs: Temp: 98.7 F (37.1 C) (10/01 1411) Temp Source: Oral (10/01 1411) BP: 117/60 (10/01 1411)  Labs:  Recent Labs  03/11/16 0406  03/11/16 2300 03/12/16 0445 03/13/16 0345  HGB 7.3*  --   --  7.2* 7.2*  HCT 21.7*  --   --  21.3* 21.4*  PLT 271  --   --  280 302  LABPROT 15.2  --   --   --   --   INR 1.20  --   --   --   --   HEPARINUNFRC  --   < > 0.33 0.36 0.30  CREATININE 0.52  --   --  0.50 0.34*  < > = values in this interval not displayed.  Estimated Creatinine Clearance: 78.4 mL/min (by C-G formula based on SCr of 0.34 mg/dL (L)).   Medications:   PTA Xarelto 20mg  daily - LD 9/25 @ 1700 Infusions:  . sodium chloride 50 mL/hr at 03/12/16 2322    Assessment: 55 yo F on chronic Xarelto for hx VTE which is being held due to acute illness/ renal failure (est CrCl <32ml/min). Last xarelto dose taken on 03/07/16 at 1700.  Pharmacy was initially consulted to dose Heparin, and is now consulted to convert to Lovenox.  RN reports no immediate surgical plans.  Today, 03/13/2016:  No active bleeding or complications reported/documented.  RN notes wounds but no current bleeding.  Hgb remains low/stable today (improved slightly after transfusion 9/28); Plt remain WNL  MD ordered PRBC transfusion on 10/1.  Renal funct has improved, CrCl ~ 78 ml/min  Tolerating diet, no nausea, having BMs.   Goal of Therapy:  Monitor platelets by anticoagulation protocol: Yes   Plan:   Heparin drip d/c.  Lovenox 75 mg (1 mg/kg) SQ BID - first dose one hour after heparin d/c.  Check CBC daily.   Follow up anticoagulation plans.  Noted plan for repeat CT scan early this coming week to re-evaluate intra-abdominal fluid  collection.   Gretta Arab PharmD, BCPS Pager [phone] 03/13/2016 3:47 PM

## 2016-03-13 NOTE — Progress Notes (Signed)
Patient ID: [name], female   DOB: 05/08/1961, 55 y.o.   MRN: [id number] [facility] Surgery Progress Note:   * No surgery found *  Subjective: Mental status is pleasant and appropriate;   Objective: Vital signs in last 24 hours: Temp:  [99 F (37.2 C)-99.5 F (37.5 C)] 99 F (37.2 C) (10/01 0600) Resp:  [20-26] 20 (10/01 0600) BP: (130-138)/(50-65) 133/60 (10/01 0600) SpO2:  [99 %-100 %] 99 % (10/01 0600) Weight:  [74.5 kg (164 lb 3.9 oz)] 74.5 kg (164 lb 3.9 oz) (10/01 0500)  Intake/Output from previous day: 09/30 0701 - 10/01 0700 In: 3249.8 [P.O.:600; I.V.:1493.8; IV Piggyback:1156] Out: 2950 [Urine:2950] Intake/Output this shift: No intake/output data recorded.  Physical Exam: Work of breathing is normal;  Abdomen is sore in the lower portion with palpable firmness.    Lab Results:  Results for orders placed or performed during the hospital encounter of 03/08/16 (from the past 48 hour(s))  Glucose, capillary     Status: Abnormal   Collection Time: 03/11/16 12:36 PM  Result Value Ref Range   Glucose-Capillary 177 (H) 65 - 99 mg/dL   Comment 1 Notify RN    Comment 2 Document in Chart   Heparin level (unfractionated)     Status: Abnormal   Collection Time: 03/11/16  4:00 PM  Result Value Ref Range   Heparin Unfractionated 0.20 (L) 0.30 - 0.70 IU/mL    Comment:        IF HEPARIN RESULTS ARE BELOW EXPECTED VALUES, AND PATIENT DOSAGE HAS BEEN CONFIRMED, SUGGEST FOLLOW UP TESTING OF ANTITHROMBIN III LEVELS.   Glucose, capillary     Status: Abnormal   Collection Time: 03/11/16  4:13 PM  Result Value Ref Range   Glucose-Capillary 164 (H) 65 - 99 mg/dL  Glucose, capillary     Status: Abnormal   Collection Time: 03/11/16  8:10 PM  Result Value Ref Range   Glucose-Capillary 159 (H) 65 - 99 mg/dL  Heparin level (unfractionated)     Status: None   Collection Time: 03/11/16 11:00 PM  Result Value Ref Range   Heparin Unfractionated 0.33 0.30 - 0.70 IU/mL     Comment:        IF HEPARIN RESULTS ARE BELOW EXPECTED VALUES, AND PATIENT DOSAGE HAS BEEN CONFIRMED, SUGGEST FOLLOW UP TESTING OF ANTITHROMBIN III LEVELS.   Glucose, capillary     Status: Abnormal   Collection Time: 03/11/16 11:38 PM  Result Value Ref Range   Glucose-Capillary 172 (H) 65 - 99 mg/dL  Glucose, capillary     Status: Abnormal   Collection Time: 03/12/16  4:02 AM  Result Value Ref Range   Glucose-Capillary 151 (H) 65 - 99 mg/dL  CBC     Status: Abnormal   Collection Time: 03/12/16  4:45 AM  Result Value Ref Range   WBC 13.2 (H) 4.0 - 10.5 K/uL   RBC 2.49 (L) 3.87 - 5.11 MIL/uL   Hemoglobin 7.2 (L) 12.0 - 15.0 g/dL   HCT 21.3 (L) 36.0 - 46.0 %   MCV 85.5 78.0 - 100.0 fL   MCH 28.9 26.0 - 34.0 pg   MCHC 33.8 30.0 - 36.0 g/dL   RDW 15.9 (H) 11.5 - 15.5 %   Platelets 280 150 - 400 K/uL  Heparin level (unfractionated)     Status: None   Collection Time: 03/12/16  4:45 AM  Result Value Ref Range   Heparin Unfractionated 0.36 0.30 - 0.70 IU/mL    Comment:  IF HEPARIN RESULTS ARE BELOW EXPECTED VALUES, AND PATIENT DOSAGE HAS BEEN CONFIRMED, SUGGEST FOLLOW UP TESTING OF ANTITHROMBIN III LEVELS.   Basic metabolic panel     Status: Abnormal   Collection Time: 03/12/16  4:45 AM  Result Value Ref Range   Sodium 143 135 - 145 mmol/L   Potassium 2.8 (L) 3.5 - 5.1 mmol/L    Comment: DELTA CHECK NOTED   Chloride 118 (H) 101 - 111 mmol/L   CO2 22 22 - 32 mmol/L   Glucose, Bld 146 (H) 65 - 99 mg/dL   BUN 7 6 - 20 mg/dL   Creatinine, Ser 0.50 0.44 - 1.00 mg/dL   Calcium 8.7 (L) 8.9 - 10.3 mg/dL   GFR calc non Af Amer >60 >60 mL/min   GFR calc Af Amer >60 >60 mL/min    Comment: (NOTE) The eGFR has been calculated using the CKD EPI equation. This calculation has not been validated in all clinical situations. eGFR's persistently <60 mL/min signify possible Chronic Kidney Disease.    Anion gap 3 (L) 5 - 15  Glucose, capillary     Status: Abnormal   Collection  Time: 03/12/16  8:04 AM  Result Value Ref Range   Glucose-Capillary 119 (H) 65 - 99 mg/dL   Comment 1 Notify RN    Comment 2 Document in Chart   Magnesium     Status: Abnormal   Collection Time: 03/12/16 10:20 AM  Result Value Ref Range   Magnesium 1.4 (L) 1.7 - 2.4 mg/dL  Glucose, capillary     Status: Abnormal   Collection Time: 03/12/16 12:52 PM  Result Value Ref Range   Glucose-Capillary 238 (H) 65 - 99 mg/dL  Glucose, capillary     Status: Abnormal   Collection Time: 03/12/16  4:44 PM  Result Value Ref Range   Glucose-Capillary 156 (H) 65 - 99 mg/dL  Potassium     Status: None   Collection Time: 03/12/16  6:00 PM  Result Value Ref Range   Potassium 3.5 3.5 - 5.1 mmol/L    Comment: DELTA CHECK NOTED  Glucose, capillary     Status: Abnormal   Collection Time: 03/12/16  8:13 PM  Result Value Ref Range   Glucose-Capillary 169 (H) 65 - 99 mg/dL  Glucose, capillary     Status: Abnormal   Collection Time: 03/13/16 12:05 AM  Result Value Ref Range   Glucose-Capillary 103 (H) 65 - 99 mg/dL  CBC     Status: Abnormal   Collection Time: 03/13/16  3:45 AM  Result Value Ref Range   WBC 12.1 (H) 4.0 - 10.5 K/uL   RBC 2.44 (L) 3.87 - 5.11 MIL/uL   Hemoglobin 7.2 (L) 12.0 - 15.0 g/dL   HCT 21.4 (L) 36.0 - 46.0 %   MCV 87.7 78.0 - 100.0 fL   MCH 29.5 26.0 - 34.0 pg   MCHC 33.6 30.0 - 36.0 g/dL   RDW 15.7 (H) 11.5 - 15.5 %   Platelets 302 150 - 400 K/uL  Heparin level (unfractionated)     Status: None   Collection Time: 03/13/16  3:45 AM  Result Value Ref Range   Heparin Unfractionated 0.30 0.30 - 0.70 IU/mL    Comment:        IF HEPARIN RESULTS ARE BELOW EXPECTED VALUES, AND PATIENT DOSAGE HAS BEEN CONFIRMED, SUGGEST FOLLOW UP TESTING OF ANTITHROMBIN III LEVELS.   Basic metabolic panel     Status: Abnormal   Collection Time: 03/13/16  3:45 AM  Result Value Ref Range   Sodium 140 135 - 145 mmol/L   Potassium 3.1 (L) 3.5 - 5.1 mmol/L   Chloride 115 (H) 101 - 111 mmol/L    CO2 21 (L) 22 - 32 mmol/L   Glucose, Bld 103 (H) 65 - 99 mg/dL   BUN 5 (L) 6 - 20 mg/dL   Creatinine, Ser 0.34 (L) 0.44 - 1.00 mg/dL   Calcium 8.8 (L) 8.9 - 10.3 mg/dL   GFR calc non Af Amer >60 >60 mL/min   GFR calc Af Amer >60 >60 mL/min    Comment: (NOTE) The eGFR has been calculated using the CKD EPI equation. This calculation has not been validated in all clinical situations. eGFR's persistently <60 mL/min signify possible Chronic Kidney Disease.    Anion gap 4 (L) 5 - 15  Glucose, capillary     Status: Abnormal   Collection Time: 03/13/16  4:04 AM  Result Value Ref Range   Glucose-Capillary 110 (H) 65 - 99 mg/dL  Glucose, capillary     Status: Abnormal   Collection Time: 03/13/16  7:42 AM  Result Value Ref Range   Glucose-Capillary 112 (H) 65 - 99 mg/dL    Radiology/Results: No results found.  Anti-infectives: Anti-infectives    Start     Dose/Rate Route Frequency Ordered Stop   03/10/16 1600  vancomycin (VANCOCIN) 500 mg in sodium chloride 0.9 % 100 mL IVPB  Status:  Discontinued     500 mg 100 mL/hr over 60 Minutes Intravenous Every 48 hours 03/08/16 1338 03/09/16 0906   03/09/16 2200  anidulafungin (ERAXIS) 100 mg in sodium chloride 0.9 % 100 mL IVPB  Status:  Discontinued     100 mg over 90 Minutes Intravenous Every 24 hours 03/08/16 2135 03/10/16 0841   03/09/16 1300  vancomycin (VANCOCIN) 500 mg in sodium chloride 0.9 % 100 mL IVPB     500 mg 100 mL/hr over 60 Minutes Intravenous Every 12 hours 03/09/16 0906 03/12/16 1855   03/09/16 0800  piperacillin-tazobactam (ZOSYN) IVPB 3.375 g     3.375 g 12.5 mL/hr over 240 Minutes Intravenous Every 8 hours 03/09/16 0711     03/08/16 2200  anidulafungin (ERAXIS) 200 mg in sodium chloride 0.9 % 200 mL IVPB     200 mg over 180 Minutes Intravenous  Once 03/08/16 2135 03/09/16 0100   03/08/16 2000  piperacillin-tazobactam (ZOSYN) IVPB 2.25 g  Status:  Discontinued     2.25 g 100 mL/hr over 30 Minutes Intravenous Every 6  hours 03/08/16 1338 03/09/16 0711   03/08/16 1315  piperacillin-tazobactam (ZOSYN) IVPB 3.375 g     3.375 g 100 mL/hr over 30 Minutes Intravenous  Once 03/08/16 1310 03/08/16 1431   03/08/16 1315  vancomycin (VANCOCIN) IVPB 1000 mg/200 mL premix     1,000 mg 200 mL/hr over 60 Minutes Intravenous  Once 03/08/16 1310 03/08/16 1431      Assessment/Plan: Problem List: Patient Active Problem List   Diagnosis Date Noted  . Pressure injury of skin 03/12/2016  . Septic shock ([place]) 03/08/2016  . Encounter for central line placement   . Arterial hypotension   . Neuropathic pain 01/11/2016  . Attention to nephrostomy ([place]) 01/11/2016  . Acute renal failure ([place])   . Partial small bowel obstruction 12/24/2015  . Bacteremia due to coagulase-negative Staphylococcus 12/22/2015  . Left nephrolithiasis 12/22/2015  . Gram-negative bacteremia 12/22/2015  . Palliative care encounter   . SIRS (systemic inflammatory response syndrome) ([place]) 12/18/2015  . Decubitus ulcer   .  Controlled diabetes mellitus type 2 with complications ([place])   . Neurogenic bladder 11/11/2015  . Physical deconditioning 11/11/2015  . Chronic indwelling Foley catheter 09/30/2015  . Hypokalemia 09/04/2015  . Dyslipidemia associated with type 2 diabetes mellitus ([place]) 08/28/2015  . GERD without esophagitis 08/28/2015  . Acute renal failure superimposed on stage 3 chronic kidney disease ([place]) 07/21/2015  . Leukocytosis 07/21/2015  . Anemia of chronic disease 07/21/2015  . Hyperkalemia 07/21/2015  . Catheter-associated urinary tract infection ([place]) 07/21/2015  . Malnutrition of moderate degree 07/21/2015  . DVT, lower extremity ([place]) 12/28/2014  . Multiple sclerosis diagnosed 2002-not on Therapy any longer 06/26/2012    CT showed collection in lower abdomen.  Will discuss percutaneous drainage.   * No surgery found *    LOS: 5 days   Matt B. Hassell Done, MD, [facility] [phone]  beeper [phone]  03/13/2016 10:01 AM

## 2016-03-13 NOTE — Care Management Note (Signed)
Case Management Note  Patient Details  Name: NAYLANI STALSBERG MRN: [id number] Date of Birth: 12/01/1960  Subjective/Objective:    Pelvic abscess                Action/Plan: Discharge Planning: Chart reviewed. Pt does not want to return back to [facility], she prefers [facility]. CSW consult for SNF placement.    Expected Discharge Date:                Expected Discharge Plan:  Skilled Nursing Facility  In-House Referral:  Clinical Social Work  Discharge planning Services  CM Consult  Post Acute Care Choice:  NA Choice offered to:  NA  DME Arranged:  N/A DME Agency:  NA  HH Arranged:  NA HH Agency:  NA  Status of Service:  In process, will continue to follow  If discussed at Long Length of Stay Meetings, dates discussed:    Additional Comments:  Erenest Rasher, RN 03/13/2016, 11:33 AM

## 2016-03-13 NOTE — Progress Notes (Addendum)
PROGRESS NOTE    [name]  [id number] DOB: [birth year] DOA: 03/08/2016 PCP: No primary care provider on file.    Brief Narrative: 55 y/o F, SNF resident, with multiple sclerosis admitted on 9/26 with altered mental status and septic shock.  Potential sources of infection include pelvic abscess, UTI & decubitus ulcers (favor the first 2). She is hemodynamically stable. Repeat imaging demonstrates the pelvic abscess once again but it is not amendable to perc drain. For now we will cont abx, will plan for repeat imaging next week, however given on-going pain we will ask general surgery to see as well.    Assessment & Plan:   Active Problems:   Physical deconditioning   Pressure ulcer   Septic shock (HCC)   Pressure injury of skin   Septic shock:  From the pelvic abscess, requiring vasopressors. curently off pressors and transferred to [facility] service from PCCM on 9/30. Resume IV zosyn    Acute Kidney Injury on stage 3 CKD:: From volume depletion. Resolved.   Anemia: secondary to chronic disease.  S/p 1 UNIT of prbc transfusion.  Currently 7.2.  Ordered 1 unit of prbc transfusion. Repeat hemoglobin in am.    Pelvic abscess: Will need repeat CT for further evaluation.  Surgery following the patient.    Hypokalemia and hypomagnesemia: - replete as needed.     Leukocytosis: From the abscess.  Monitor.   Hypertension:  bp well controlled.    Diabetes Mellitus: CBG (last 3)   Recent Labs  03/13/16 0404 03/13/16 0742 03/13/16 1151  GLUCAP 110* 112* 163*    Resume SSI.     DVT: On IV heparin.    DVT prophylaxis: HEPARIN Code Status: full code.  Family Communication: none at bedside.  Disposition Plan: pending further eval. Back to SNF when stable.    Consultants:    surgery.   PCCM till 9/29    Procedures: L IJ TLC 9/26>>>   Antimicrobials:  CULTURES: BCx2 9/26 >> negative so far.  UC 9/26 >> mult orgs   ANTIBIOTICS: Vanco 9/26  (empiric) >>  Zosyn 9/26 (empiric) >>   Subjective: Reports some abd pain.   Objective: Vitals:   03/12/16 2200 03/13/16 0500 03/13/16 0600 03/13/16 1411  BP: 138/65  133/60 117/60  Pulse:      Resp: 20  20 20   Temp: 99.5 F (37.5 C)  99 F (37.2 C) 98.7 F (37.1 C)  TempSrc: Oral  Oral Oral  SpO2: 100%  99% 99%  Weight:  74.5 kg (164 lb 3.9 oz)    Height:        Intake/Output Summary (Last 24 hours) at 03/13/16 1442 Last data filed at 03/13/16 1410  Gross per 24 hour  Intake          3609.78 ml  Output             3625 ml  Net           -15.22 ml   Filed Weights   03/11/16 0414 03/12/16 0600 03/13/16 0500  Weight: 69.6 kg (153 lb 7 oz) 71.7 kg (158 lb 1.1 oz) 74.5 kg (164 lb 3.9 oz)    Examination:  General exam: Appears calm and comfortable  Respiratory system: Clear to auscultation. Respiratory effort normal. Cardiovascular system: S1 & S2 heard, RRR. No JVD, murmurs, rubs, gallops or clicks. No pedal edema. Gastrointestinal system: Abdomen is mildly tender, soft. No organomegaly or masses felt. Normal bowel sounds heard. Ostomy in place.  Central  nervous system: Alert and oriented. No focal neurological deficits. Skin: No rashes, lesions or ulcers Psychiatry: Judgement and insight appear normal. Mood & affect appropriate.     Data Reviewed: I have personally reviewed following labs and imaging studies  CBC:  Recent Labs Lab 03/10/16 0510 03/10/16 1340 03/11/16 0406 03/12/16 0445 03/13/16 0345  WBC 16.9* 17.3* 14.5* 13.2* 12.1*  NEUTROABS  --  14.2*  --   --   --   HGB 6.7* 7.2* 7.3* 7.2* 7.2*  HCT 20.2* 21.0* 21.7* 21.3* 21.4*  MCV 87.8 86.4 88.9 85.5 87.7  PLT 292 263 271 280 [id number]   Basic Metabolic Panel:  Recent Labs Lab 03/09/16 0305 03/10/16 0510 03/11/16 0406 03/12/16 0445 03/12/16 1020 03/12/16 1800 03/13/16 0345  NA 141 140 142 143  --   --  140  K 3.0* 2.8* 3.6 2.8*  --  3.5 3.1*  CL 111 115* 119* 118*  --   --  115*  CO2 22  21* 19* 22  --   --  21*  GLUCOSE 131* 81 76 146*  --   --  103*  BUN 38* 15 9 7   --   --  5*  CREATININE 1.18* 0.49 0.52 0.50  --   --  0.34*  CALCIUM 9.2 9.0 9.0 8.7*  --   --  8.8*  MG 1.8 1.8 1.6*  --  1.4*  --   --   PHOS 2.8  --  2.0*  --   --   --   --    GFR: Estimated Creatinine Clearance: 78.4 mL/min (by C-G formula based on SCr of 0.34 mg/dL (L)). Liver Function Tests:  Recent Labs Lab 03/08/16 1240  AST 21  ALT 13*  ALKPHOS 40  BILITOT 0.6  PROT 7.7  ALBUMIN 3.2*   No results for input(s): LIPASE, AMYLASE in the last 168 hours. No results for input(s): AMMONIA in the last 168 hours. Coagulation Profile:  Recent Labs Lab 03/08/16 2119 03/11/16 0406  INR 1.68 1.20   Cardiac Enzymes:  Recent Labs Lab 03/08/16 1500 03/08/16 2119 03/09/16 0305  TROPONINI 0.03* <0.03 <0.03   BNP (last 3 results) No results for input(s): PROBNP in the last 8760 hours. HbA1C: No results for input(s): HGBA1C in the last 72 hours. CBG:  Recent Labs Lab 03/12/16 2013 03/13/16 0005 03/13/16 0404 03/13/16 0742 03/13/16 1151  GLUCAP 169* 103* 110* 112* 163*   Lipid Profile: No results for input(s): CHOL, HDL, LDLCALC, TRIG, CHOLHDL, LDLDIRECT in the last 72 hours. Thyroid Function Tests: No results for input(s): TSH, T4TOTAL, FREET4, T3FREE, THYROIDAB in the last 72 hours. Anemia Panel: No results for input(s): VITAMINB12, FOLATE, FERRITIN, TIBC, IRON, RETICCTPCT in the last 72 hours. Sepsis Labs:  Recent Labs Lab 03/08/16 1500 03/08/16 1518 03/08/16 2118 03/09/16 0105 03/09/16 0305 03/10/16 0510  PROCALCITON 61.85  --   --   --  51.31 25.07  LATICACIDVEN  --  5.56* 1.6 1.2  --   --     Recent Results (from the past 240 hour(s))  Blood Culture (routine x 2)     Status: None (Preliminary result)   Collection Time: 03/08/16 12:40 PM  Result Value Ref Range Status   Specimen Description BLOOD RIGHT ARM  Final   Special Requests BOTTLES DRAWN AEROBIC AND  ANAEROBIC 5CC  Final   Culture   Final    NO GROWTH 4 DAYS Performed at [facility]    Report Status PENDING  Incomplete  Urine culture     Status: Abnormal   Collection Time: 03/08/16 12:40 PM  Result Value Ref Range Status   Specimen Description URINE, CLEAN CATCH  Final   Special Requests NONE  Final   Culture MULTIPLE SPECIES PRESENT, SUGGEST RECOLLECTION (A)  Final   Report Status 03/10/2016 FINAL  Final  Blood Culture (routine x 2)     Status: None (Preliminary result)   Collection Time: 03/08/16  2:12 PM  Result Value Ref Range Status   Specimen Description BLOOD RIGHT ANTECUBITAL  Final   Special Requests BOTTLES DRAWN AEROBIC ONLY 2ML  Final   Culture   Final    NO GROWTH 4 DAYS Performed at [facility]    Report Status PENDING  Incomplete  MRSA PCR Screening     Status: None   Collection Time: 03/08/16  6:16 PM  Result Value Ref Range Status   MRSA by PCR NEGATIVE NEGATIVE Final    Comment:        The GeneXpert MRSA Assay (FDA approved for NASAL specimens only), is one component of a comprehensive MRSA colonization surveillance program. It is not intended to diagnose MRSA infection nor to guide or monitor treatment for MRSA infections.          Radiology Studies: No results found.      Scheduled Meds: . sodium chloride   Intravenous Once  . atorvastatin  10 mg Oral q1800  . baclofen  10 mg Oral TID  . chlorhexidine  15 mL Mouth Rinse BID  . famotidine  20 mg Oral Daily  . feeding supplement  1 Container Oral BID BM  . insulin aspart  0-9 Units Subcutaneous Q4H  . mouth rinse  15 mL Mouth Rinse q12n4p  . piperacillin-tazobactam (ZOSYN)  IV  3.375 g Intravenous Q8H   Continuous Infusions: . sodium chloride 50 mL/hr at 03/12/16 2322  . heparin 1,450 Units/hr (03/12/16 2310)     LOS: 5 days    Time spent: 25 minutes.     Hosie Poisson, MD [facility] Pager [phone]  If 7PM-7AM, please contact  night-coverage [email or web address] Password [email or web address] 03/13/2016, 2:42 PM

## 2016-03-14 ENCOUNTER — Ambulatory Visit (HOSPITAL_COMMUNITY): Admission: RE | Admit: 2016-03-14 | Payer: Medicare Other | Source: Ambulatory Visit | Admitting: Urology

## 2016-03-14 HISTORY — DX: Lymphedema, not elsewhere classified: I89.0

## 2016-03-14 HISTORY — DX: Muscle weakness (generalized): M62.81

## 2016-03-14 HISTORY — DX: Essential (primary) hypertension: I10

## 2016-03-14 HISTORY — DX: Unspecified protein-calorie malnutrition: E46

## 2016-03-14 HISTORY — DX: Gastro-esophageal reflux disease without esophagitis: K21.9

## 2016-03-14 HISTORY — DX: Neuromuscular dysfunction of bladder, unspecified: N31.9

## 2016-03-14 HISTORY — DX: Elevated white blood cell count, unspecified: D72.829

## 2016-03-14 HISTORY — DX: Anemia, unspecified: D64.9

## 2016-03-14 HISTORY — DX: Calculus of kidney: N20.0

## 2016-03-14 HISTORY — DX: Myoneural disorder, unspecified: G70.9

## 2016-03-14 HISTORY — DX: Presence of urogenital implants: Z96.0

## 2016-03-14 HISTORY — DX: Type 2 diabetes mellitus with diabetic neuropathy, unspecified: E11.40

## 2016-03-14 HISTORY — DX: Polyneuropathy, unspecified: G62.9

## 2016-03-14 LAB — BASIC METABOLIC PANEL
Anion gap: 3 — ABNORMAL LOW (ref 5–15)
Anion gap: 2 — ABNORMAL LOW (ref 5–15)
BUN: 5 mg/dL — AB (ref 6–20)
BUN: 6 mg/dL (ref 6–20)
CALCIUM: 8.6 mg/dL — AB (ref 8.9–10.3)
CHLORIDE: 116 mmol/L — AB (ref 101–111)
CO2: 24 mmol/L (ref 22–32)
CO2: 24 mmol/L (ref 22–32)
CREATININE: 0.42 mg/dL — AB (ref 0.44–1.00)
Calcium: 8.6 mg/dL — ABNORMAL LOW (ref 8.9–10.3)
Chloride: 116 mmol/L — ABNORMAL HIGH (ref 101–111)
Creatinine, Ser: 0.38 mg/dL — ABNORMAL LOW (ref 0.44–1.00)
GFR calc Af Amer: >60 mL/min (ref >60)
GFR calc Af Amer: >60 mL/min (ref >60)
GFR calc non Af Amer: >60 mL/min (ref >60)
GLUCOSE: 107 mg/dL — AB (ref 65–99)
GLUCOSE: 135 mg/dL — AB (ref 65–99)
POTASSIUM: 2.8 mmol/L — AB (ref 3.5–5.1)
Potassium: 3.3 mmol/L — ABNORMAL LOW (ref 3.5–5.1)
SODIUM: 142 mmol/L (ref 135–145)
Sodium: 143 mmol/L (ref 135–145)

## 2016-03-14 LAB — CBC
HCT: 24.6 % — ABNORMAL LOW (ref 36.0–46.0)
Hemoglobin: 8.2 g/dL — ABNORMAL LOW (ref 12.0–15.0)
MCH: 29.4 pg (ref 26.0–34.0)
MCHC: 33.3 g/dL (ref 30.0–36.0)
MCV: 88.2 fL (ref 78.0–100.0)
PLATELETS: 339 10*3/uL (ref 150–400)
RBC: 2.79 MIL/uL — ABNORMAL LOW (ref 3.87–5.11)
RDW: 15.9 % — ABNORMAL HIGH (ref 11.5–15.5)
WBC: 11.2 10*3/uL — ABNORMAL HIGH (ref 4.0–10.5)

## 2016-03-14 LAB — GLUCOSE, CAPILLARY
GLUCOSE-CAPILLARY: 126 mg/dL — AB (ref 65–99)
GLUCOSE-CAPILLARY: 95 mg/dL (ref 65–99)
Glucose-Capillary: 102 mg/dL — ABNORMAL HIGH (ref 65–99)
Glucose-Capillary: 103 mg/dL — ABNORMAL HIGH (ref 65–99)
Glucose-Capillary: 121 mg/dL — ABNORMAL HIGH (ref 65–99)
Glucose-Capillary: 137 mg/dL — ABNORMAL HIGH (ref 65–99)

## 2016-03-14 LAB — TYPE AND SCREEN
ABO/RH(D): A POS
Antibody Screen: NEGATIVE
UNIT DIVISION: 0
Unit division: 0

## 2016-03-14 LAB — HEPARIN LEVEL (UNFRACTIONATED)

## 2016-03-14 SURGERY — NEPHROLITHOTOMY PERCUTANEOUS
Anesthesia: General | Laterality: Left

## 2016-03-14 MED ORDER — POTASSIUM CHLORIDE 10 MEQ/100ML IV SOLN
10.0000 meq | INTRAVENOUS | Status: AC
Start: 2016-03-14 — End: 2016-03-14
  Administered 2016-03-14 (×3): 10 meq via INTRAVENOUS
  Filled 2016-03-14 (×2): qty 100

## 2016-03-14 MED ORDER — POTASSIUM CHLORIDE CRYS ER 20 MEQ PO TBCR
40.0000 meq | EXTENDED_RELEASE_TABLET | Freq: Two times a day (BID) | ORAL | Status: AC
  Administered 2016-03-14 (×2): 40 meq via ORAL
  Filled 2016-03-14 (×2): qty 2

## 2016-03-14 NOTE — Evaluation (Signed)
Physical Therapy Evaluation Patient Details Name: [name] MRN: [id number] DOB: 04/01/1961 Today's Date: 03/14/2016   History of Present Illness  55 y/o F, SNF resident, with multiple sclerosis admitted on 9/26 with altered mental status and septic shock.  Potential sources of infection include pelvic abscess, UTI & decubitus ulcers (favor the first 2). She is hemodynamically stable. Repeat imaging demonstrates the pelvic abscess once again but it is not amendable to perc drain.. Patient has  sacral decubitus ongoing.Marland Kitchen Resides in SNF, total assistance for mobility. mobilizes in power chair.  Clinical Impression  The patient demonstrates no active lower body movement, Left UE is most functional. She requires total assistance for mobility and is lifted into a power chair. No acute skilled needs at this time. PT to sign off.    Follow Up Recommendations Supervision/Assistance - 24 hour    Equipment Recommendations  None recommended by PT    Recommendations for Other Services       Precautions / Restrictions Precautions Precautions: Fall Precaution Comments: painful to touch legs      Mobility  Bed Mobility               General bed mobility comments: patient requires 2 assist for rolling and mechanical lift out of bed. patient describes that she is lifted into [facility] and she powers her chair around the facility. Unable  to give info if she is active in skilled PT.  Transfers                    Ambulation/Gait                Stairs            Wheelchair Mobility    Modified Rankin (Stroke Patients Only)       Balance                                             Pertinent Vitals/Pain Pain Assessment: Faces Faces Pain Scale: Hurts even more Pain Location: both feet and knee when moved Pain Descriptors / Indicators: Discomfort    Home Living Family/patient expects to be discharged to:: Skilled nursing facility                  Additional Comments: hoyer lift to [place] chair at SNF    Prior Function Level of Independence: Needs assistance   Gait / Transfers Assistance Needed: patient reports that she can mobilize power chair when someone walks beside her.   ADL's / Homemaking Assistance Needed: total (A) for all LB adls. Pt able to feed herself with L hand but is R hand dominant now with deficits        Hand Dominance        Extremity/Trunk Assessment   Upper Extremity Assessment: LUE deficits/detail;RUE deficits/detail RUE Deficits / Details: noted edema of the hand. gross grip and gross extension of fingers     LUE Deficits / Details: able to hold menue             Communication      Cognition Arousal/Alertness: Awake/alert Behavior During Therapy: WFL for tasks assessed/performed Overall Cognitive Status: No family/caregiver present to determine baseline cognitive functioning                      General Comments  Exercises     Assessment/Plan    PT Assessment Patent does not need any further PT services  PT Problem List            PT Treatment Interventions      PT Goals (Current goals can be found in the Care Plan section)  Acute Rehab PT Goals PT Goal Formulation: All assessment and education complete, DC therapy (patient is at her baseline)    Frequency     Barriers to discharge        Co-evaluation               End of Session   Activity Tolerance: Patient limited by pain Patient left: in bed;with call bell/phone within reach Nurse Communication: Mobility status         Time: [id number] PT Time Calculation (min) (ACUTE ONLY): 16 min   Charges:   PT Evaluation $PT Eval Low Complexity: 1 Procedure     PT G CodesClaretha Cooper 03/14/2016, 2:05 PM Tresa Endo PT [phone]

## 2016-03-14 NOTE — Progress Notes (Signed)
[facility] Progress Note     Subjective: NAE. Reports abdominal pain is only minimally improved from a few days ago. + flatus and BMs. Tolerating diet. Denies fever, chills, night sweats.  Objective: Vital signs in last 24 hours: Temp:  [98.5 F (36.9 C)-98.9 F (37.2 C)] 98.5 F (36.9 C) (10/02 0519) Pulse Rate:  [72-83] 72 (10/02 0519) Resp:  [16-20] 16 (10/02 0519) BP: (117-158)/(55-66) 139/64 (10/02 0519) SpO2:  [98 %-100 %] 98 % (10/02 0519) Weight:  [164 lb 0.4 oz (74.4 kg)] 164 lb 0.4 oz (74.4 kg) (10/02 0500) Last BM Date: 03/13/16  Intake/Output from previous day: 10/01 0701 - 10/02 0700 In: 3187 [P.O.:1395; I.V.:1440; Blood:302; IV Piggyback:50] Out: [id number] [Urine:4300] Intake/Output this shift: Total I/O In: -  Out: 200 [Urine:200]  PE: Gen:  Alert, NAD, pleasant Card:  RRR, no M/G/R heard Pulm:  CTA, no W/R/R Abd: Soft, TTP of lower abdomen, more severe over RLQ, with palpable firmness most appreciated over RLQ   Lab Results:   Recent Labs  03/13/16 0345 03/14/16 0400  WBC 12.1* 11.2*  HGB 7.2* 8.2*  HCT 21.4* 24.6*  PLT 302 339   BMET  Recent Labs  03/13/16 0345 03/14/16 0400  NA 140 143  K 3.1* 2.8*  CL 115* 116*  CO2 21* 24  GLUCOSE 103* 107*  BUN 5* 5*  CREATININE 0.34* 0.38*  CALCIUM 8.8* 8.6*   CMP     Component Value Date/Time   NA 143 03/14/2016 0400   NA 139 01/06/2016   K 2.8 (L) 03/14/2016 0400   CL 116 (H) 03/14/2016 0400   CO2 24 03/14/2016 0400   GLUCOSE 107 (H) 03/14/2016 0400   BUN 5 (L) 03/14/2016 0400   BUN 19 01/06/2016   CREATININE 0.38 (L) 03/14/2016 0400   CALCIUM 8.6 (L) 03/14/2016 0400   PROT 7.7 03/08/2016 1240   ALBUMIN 3.2 (L) 03/08/2016 1240   AST 21 03/08/2016 1240   ALT 13 (L) 03/08/2016 1240   ALKPHOS 40 03/08/2016 1240   BILITOT 0.6 03/08/2016 1240   GFRNONAA >60 03/14/2016 0400   GFRAA >60 03/14/2016 0400    Anti-infectives: Anti-infectives    Start     Dose/Rate Route  Frequency Ordered Stop   03/10/16 1600  vancomycin (VANCOCIN) 500 mg in sodium chloride 0.9 % 100 mL IVPB  Status:  Discontinued     500 mg 100 mL/hr over 60 Minutes Intravenous Every 48 hours 03/08/16 1338 03/09/16 0906   03/09/16 2200  anidulafungin (ERAXIS) 100 mg in sodium chloride 0.9 % 100 mL IVPB  Status:  Discontinued     100 mg over 90 Minutes Intravenous Every 24 hours 03/08/16 2135 03/10/16 0841   03/09/16 1300  vancomycin (VANCOCIN) 500 mg in sodium chloride 0.9 % 100 mL IVPB     500 mg 100 mL/hr over 60 Minutes Intravenous Every 12 hours 03/09/16 0906 03/12/16 1855   03/09/16 0800  piperacillin-tazobactam (ZOSYN) IVPB 3.375 g     3.375 g 12.5 mL/hr over 240 Minutes Intravenous Every 8 hours 03/09/16 0711     03/08/16 2200  anidulafungin (ERAXIS) 200 mg in sodium chloride 0.9 % 200 mL IVPB     200 mg over 180 Minutes Intravenous  Once 03/08/16 2135 03/09/16 0100   03/08/16 2000  piperacillin-tazobactam (ZOSYN) IVPB 2.25 g  Status:  Discontinued     2.25 g 100 mL/hr over 30 Minutes Intravenous Every 6 hours 03/08/16 1338 03/09/16 0711   03/08/16 1315  piperacillin-tazobactam (  ZOSYN) IVPB 3.375 g     3.375 g 100 mL/hr over 30 Minutes Intravenous  Once 03/08/16 1310 03/08/16 1431   03/08/16 1315  vancomycin (VANCOCIN) IVPB 1000 mg/200 mL premix     1,000 mg 200 mL/hr over 60 Minutes Intravenous  Once 03/08/16 1310 03/08/16 1431     Assessment/Plan Abdominal pain Intra-abdominal fluid collection - CT abd/pelv w/ contrast 03/10/16: right anterior pelvic fluid collection 9.5 x 3.8 x 2.9 cm (enlarged from CT scan 9/26 - 8.4 x 3.7 x 2.7 cm)  - not amenable to percutaneous drainage by IR - Repeat CT scan 2-3 days - afebrile; still with lower abdominal tenderness  Leukocytosis - improving; 11.2 yesterday, today's CBC pending   Hypokalemia - 2.8 Multiple Sclerosis (Dx 2002) UTI Neurogenic bladder - chronic indwelling foley Bilateral decubitus ulcers - stable AKI, CKD  3 Anemia of chronic disease PMH VTE - home xarelto on hold  FEN: carb modified diet ID: [email or web address]  DVT Proph: Lovenox, SCD's   Plan: Will discuss with MD    LOS: 6 days    Jill Alexanders , [facility] Surgery 03/14/2016, 8:12 AM Pager: [phone] Consults: [phone] Mon-Fri 7:00 am-4:30 pm Sat-Sun 7:00 am-11:30 am

## 2016-03-14 NOTE — Progress Notes (Signed)
CSW assisting with d/c planning. CSW met with pt this am and spoke with pt's cousin, [name] [phone]. Pt reports that she was receiving rehab at [facility] prior to admission. Pt reports that she was wondering is she could transfer to a facility in [place] to finish her rehab. Pt has [employer] medicare and is on co payment days at [facility]. Pt has applied for medicaid and a decision is pending. Pt's cousin has investigated other possible placements in [place] and has determined that pt should return to [facility] while her medicaid is still pending. Pt is in agreement with this plan. Clinicals have been sent to [facility] for review. SNF will readmit when pt is stable for d/c. CSW will continue to follow to assist with d/c planning to SNF.  Werner Lean LCSW [phone]

## 2016-03-14 NOTE — Progress Notes (Signed)
PROGRESS NOTE    [name]  [id number] DOB: March 31, 1961 DOA: 03/08/2016 PCP: No primary care provider on file.    Brief Narrative: 55 y/o F, SNF resident, with multiple sclerosis admitted on 9/26 with altered mental status and septic shock.  Potential sources of infection include pelvic abscess, UTI & decubitus ulcers (favor the first 2). She is hemodynamically stable. Repeat imaging demonstrates the pelvic abscess once again but it is not amendable to perc drain. For now we will cont abx, will plan for repeat imaging next week, however given on-going pain we will ask general surgery to see as well.    Assessment & Plan:   Active Problems:   Physical deconditioning   Pressure ulcer   Septic shock (HCC)   Pressure injury of skin   Septic shock:  From the pelvic abscess, requiring vasopressors. curently off pressors and transferred to [facility] service from PCCM on 9/30. Resume IV zosyn    Acute Kidney Injury on stage 3 CKD:: From volume depletion. Resolved.   Anemia: secondary to chronic disease.  S/p 2 units of prbc transfusion, repeat hemoglobin is 8.2   Pelvic abscess: Will need repeat CT for further evaluation.  Surgery following the patient.    Hypokalemia and hypomagnesemia: Repeat potassium is 2.8. repleted and repeat tonight.     Leukocytosis: From the abscess. Improving.  Monitor.   Hypertension:  bp well controlled.    Diabetes Mellitus: CBG (last 3)   Recent Labs  03/14/16 0742 03/14/16 1248 03/14/16 1559  GLUCAP 102* 126* 95    Resume SSI.     DVT: On lovenox.   Physical deconditioning: PT EVAL recommended SNF.    DVT prophylaxis: Lovenox.  Code Status: full code.  Family Communication: none at bedside.  Disposition Plan: pending further eval. Back to SNF when stable.    Consultants:    surgery.   PCCM till 9/29    Procedures: L IJ TLC 9/26>>>   Antimicrobials:  CULTURES: BCx2 9/26 >> negative so far.  UC 9/26 >>  mult orgs   ANTIBIOTICS: Vanco 9/26 (empiric) >>  Zosyn 9/26 (empiric) >>   Subjective: Reports some abd pain.  BETTER than the last two days.   Objective: Vitals:   03/13/16 2108 03/14/16 0500 03/14/16 0519 03/14/16 1220  BP: (!) 158/55  139/64 (!) 154/67  Pulse: 83  72 89  Resp: 18  16 16   Temp: 98.6 F (37 C)  98.5 F (36.9 C) 98.5 F (36.9 C)  TempSrc: Oral  Oral Oral  SpO2: 98%  98% 100%  Weight:  74.4 kg (164 lb 0.4 oz)    Height:        Intake/Output Summary (Last 24 hours) at 03/14/16 1843 Last data filed at 03/14/16 1830  Gross per 24 hour  Intake             3042 ml  Output             4000 ml  Net             -958 ml   Filed Weights   03/12/16 0600 03/13/16 0500 03/14/16 0500  Weight: 71.7 kg (158 lb 1.1 oz) 74.5 kg (164 lb 3.9 oz) 74.4 kg (164 lb 0.4 oz)    Examination:  General exam: Appears calm and comfortable  Respiratory system: Clear to auscultation. Respiratory effort normal. Cardiovascular system: S1 & S2 heard, RRR. No JVD, murmurs, rubs, gallops or clicks. No pedal edema. Gastrointestinal system: Abdomen is mildly  tender, soft. No organomegaly or masses felt. Normal bowel sounds heard. Ostomy in place.  Central nervous system: Alert and oriented. No focal neurological deficits. Skin: No rashes, lesions or ulcers Psychiatry: Judgement and insight appear normal. Mood & affect appropriate.     Data Reviewed: I have personally reviewed following labs and imaging studies  CBC:  Recent Labs Lab 03/10/16 1340 03/11/16 0406 03/12/16 0445 03/13/16 0345 03/14/16 0400  WBC 17.3* 14.5* 13.2* 12.1* 11.2*  NEUTROABS 14.2*  --   --   --   --   HGB 7.2* 7.3* 7.2* 7.2* 8.2*  HCT 21.0* 21.7* 21.3* 21.4* 24.6*  MCV 86.4 88.9 85.5 87.7 88.2  PLT 263 271 280 302 [id number]   Basic Metabolic Panel:  Recent Labs Lab 03/09/16 0305 03/10/16 0510 03/11/16 0406 03/12/16 0445 03/12/16 1020 03/12/16 1800 03/13/16 0345 03/13/16 1500 03/14/16 0400    NA 141 140 142 143  --   --  140  --  143  K 3.0* 2.8* 3.6 2.8*  --  3.5 3.1*  --  2.8*  CL 111 115* 119* 118*  --   --  115*  --  116*  CO2 22 21* 19* 22  --   --  21*  --  24  GLUCOSE 131* 81 76 146*  --   --  103*  --  107*  BUN 38* 15 9 7   --   --  5*  --  5*  CREATININE 1.18* 0.49 0.52 0.50  --   --  0.34*  --  0.38*  CALCIUM 9.2 9.0 9.0 8.7*  --   --  8.8*  --  8.6*  MG 1.8 1.8 1.6*  --  1.4*  --   --  1.9  --   PHOS 2.8  --  2.0*  --   --   --   --   --   --    GFR: Estimated Creatinine Clearance: 78.4 mL/min (by C-G formula based on SCr of 0.38 mg/dL (L)). Liver Function Tests:  Recent Labs Lab 03/08/16 1240  AST 21  ALT 13*  ALKPHOS 40  BILITOT 0.6  PROT 7.7  ALBUMIN 3.2*   No results for input(s): LIPASE, AMYLASE in the last 168 hours. No results for input(s): AMMONIA in the last 168 hours. Coagulation Profile:  Recent Labs Lab 03/08/16 2119 03/11/16 0406  INR 1.68 1.20   Cardiac Enzymes:  Recent Labs Lab 03/08/16 1500 03/08/16 2119 03/09/16 0305  TROPONINI 0.03* <0.03 <0.03   BNP (last 3 results) No results for input(s): PROBNP in the last 8760 hours. HbA1C: No results for input(s): HGBA1C in the last 72 hours. CBG:  Recent Labs Lab 03/14/16 0033 03/14/16 0413 03/14/16 0742 03/14/16 1248 03/14/16 1559  GLUCAP 137* 103* 102* 126* 95   Lipid Profile: No results for input(s): CHOL, HDL, LDLCALC, TRIG, CHOLHDL, LDLDIRECT in the last 72 hours. Thyroid Function Tests: No results for input(s): TSH, T4TOTAL, FREET4, T3FREE, THYROIDAB in the last 72 hours. Anemia Panel: No results for input(s): VITAMINB12, FOLATE, FERRITIN, TIBC, IRON, RETICCTPCT in the last 72 hours. Sepsis Labs:  Recent Labs Lab 03/08/16 1500 03/08/16 1518 03/08/16 2118 03/09/16 0105 03/09/16 0305 03/10/16 0510  PROCALCITON 61.85  --   --   --  51.31 25.07  LATICACIDVEN  --  5.56* 1.6 1.2  --   --     Recent Results (from the past 240 hour(s))  Blood Culture  (routine x 2)     Status:  None   Collection Time: 03/08/16 12:40 PM  Result Value Ref Range Status   Specimen Description BLOOD RIGHT ARM  Final   Special Requests BOTTLES DRAWN AEROBIC AND ANAEROBIC 5CC  Final   Culture   Final    NO GROWTH 5 DAYS Performed at [facility]    Report Status 03/13/2016 FINAL  Final  Urine culture     Status: Abnormal   Collection Time: 03/08/16 12:40 PM  Result Value Ref Range Status   Specimen Description URINE, CLEAN CATCH  Final   Special Requests NONE  Final   Culture MULTIPLE SPECIES PRESENT, SUGGEST RECOLLECTION (A)  Final   Report Status 03/10/2016 FINAL  Final  Blood Culture (routine x 2)     Status: None   Collection Time: 03/08/16  2:12 PM  Result Value Ref Range Status   Specimen Description BLOOD RIGHT ANTECUBITAL  Final   Special Requests BOTTLES DRAWN AEROBIC ONLY 2ML  Final   Culture   Final    NO GROWTH 5 DAYS Performed at [facility]    Report Status 03/13/2016 FINAL  Final  MRSA PCR Screening     Status: None   Collection Time: 03/08/16  6:16 PM  Result Value Ref Range Status   MRSA by PCR NEGATIVE NEGATIVE Final    Comment:        The GeneXpert MRSA Assay (FDA approved for NASAL specimens only), is one component of a comprehensive MRSA colonization surveillance program. It is not intended to diagnose MRSA infection nor to guide or monitor treatment for MRSA infections.          Radiology Studies: No results found.      Scheduled Meds: . sodium chloride   Intravenous Once  . atorvastatin  10 mg Oral q1800  . baclofen  10 mg Oral TID  . chlorhexidine  15 mL Mouth Rinse BID  . enoxaparin (LOVENOX) injection  75 mg Subcutaneous Q12H  . famotidine  20 mg Oral Daily  . feeding supplement  1 Container Oral BID BM  . insulin aspart  0-9 Units Subcutaneous Q4H  . mouth rinse  15 mL Mouth Rinse q12n4p  . piperacillin-tazobactam (ZOSYN)  IV  3.375 g Intravenous Q8H   Continuous Infusions: .  sodium chloride 50 mL/hr at 03/13/16 2239     LOS: 6 days    Time spent: 25 minutes.     Hosie Poisson, MD [facility] Pager [phone]  If 7PM-7AM, please contact night-coverage [email or web address] Password [facility] 03/14/2016, 6:43 PM

## 2016-03-14 NOTE — Progress Notes (Signed)
Signed and Held orders on chart unable to be released.   Will call IT

## 2016-03-14 NOTE — NC FL2 (Signed)
[place] LEVEL OF CARE SCREENING TOOL     IDENTIFICATION  Patient Name: [name] Birthdate: 1960-06-22 Sex: female Admission Date (Current Location): 03/08/2016  [facility] and [place] Number:  Herbalist and Address:  [facility],  [place], [place]      Provider Number: [id number]  Attending Physician Name and Address:  Hosie Poisson, MD  Relative Name and Phone Number:  [name] [phone]    Current Level of Care: Hospital Recommended Level of Care: [place] Prior Approval Number:    Date Approved/Denied:   PASRR Number:    Discharge Plan: SNF    Current Diagnoses: Patient Active Problem List   Diagnosis Date Noted  . Pressure injury of skin 03/12/2016  . Septic shock ([place]) 03/08/2016  . Encounter for central line placement   . Arterial hypotension   . Neuropathic pain 01/11/2016  . Attention to nephrostomy ([place]) 01/11/2016  . Acute renal failure ([place])   . Partial small bowel obstruction 12/24/2015  . Bacteremia due to coagulase-negative Staphylococcus 12/22/2015  . Left nephrolithiasis 12/22/2015  . Gram-negative bacteremia 12/22/2015  . Palliative care encounter   . SIRS (systemic inflammatory response syndrome) ([place]) 12/18/2015  . Pressure ulcer   . Controlled diabetes mellitus type 2 with complications ([place])   . Neurogenic bladder 11/11/2015  . Physical deconditioning 11/11/2015  . Chronic indwelling Foley catheter 09/30/2015  . Hypokalemia 09/04/2015  . Dyslipidemia associated with type 2 diabetes mellitus ([place]) 08/28/2015  . GERD without esophagitis 08/28/2015  . Acute renal failure superimposed on stage 3 chronic kidney disease ([place]) 07/21/2015  . Leukocytosis 07/21/2015  . Anemia of chronic disease 07/21/2015  . Hyperkalemia 07/21/2015  . Catheter-associated urinary tract infection ([place]) 07/21/2015  . Malnutrition of moderate degree 07/21/2015  . DVT, lower extremity ([place])  12/28/2014  . Multiple sclerosis diagnosed 2002-not on Therapy any longer 06/26/2012    Orientation RESPIRATION BLADDER Height & Weight     Self, Time, Situation, Place  Normal Indwelling catheter Weight: 164 lb 0.4 oz (74.4 kg) Height:  5\' 5"  (165.1 cm)  BEHAVIORAL SYMPTOMS/MOOD NEUROLOGICAL BOWEL NUTRITION STATUS  Other (Comment) (no behaviors)   Continent Diet (carb modified)  AMBULATORY STATUS COMMUNICATION OF NEEDS Skin   Extensive Assist Verbally Other (Comment) (Stage 2 ulcer on sacrum with daily dressings)                       Personal Care Assistance Level of Assistance  Bathing, Feeding, Dressing Bathing Assistance: Maximum assistance Feeding assistance: Independent Dressing Assistance: Maximum assistance     Functional Limitations Info  Sight, Hearing, Speech Sight Info: Adequate Hearing Info: Adequate Speech Info: Adequate    SPECIAL CARE FACTORS FREQUENCY                       Contractures Contractures Info: Not present    Additional Factors Info  Code Status Code Status Info: Full Code             Current Medications (03/14/2016):  This is the current hospital active medication list Current Facility-Administered Medications  Medication Dose Route Frequency Provider Last Rate Last Dose  . 0.9 %  sodium chloride infusion   Intravenous Continuous Rigoberto Noel, MD 50 mL/hr at 03/13/16 2239    . 0.9 %  sodium chloride infusion  250 mL Intravenous PRN Rahul P Desai, PA-C      . 0.9 %  sodium  chloride infusion   Intravenous Once Wilhelmina Mcardle, MD      . acetaminophen (TYLENOL) tablet 650 mg  650 mg Oral Q6H PRN Wilhelmina Mcardle, MD   650 mg at 03/10/16 2022  . albuterol (PROVENTIL) (2.5 MG/3ML) 0.083% nebulizer solution 2.5 mg  2.5 mg Nebulization Q3H PRN Rahul P Desai, PA-C   2.5 mg at 03/10/16 1709  . alum & mag hydroxide-simeth (MAALOX/MYLANTA) 200-200-20 MG/5ML suspension 30 mL  30 mL Oral Q6H PRN Chesley Mires, MD   30 mL at 03/11/16 0742  .  atorvastatin (LIPITOR) tablet 10 mg  10 mg Oral q1800 Rahul P Desai, PA-C   10 mg at 03/13/16 1719  . baclofen (LIORESAL) tablet 10 mg  10 mg Oral TID Rahul P Desai, PA-C   10 mg at 03/14/16 0810  . chlorhexidine (PERIDEX) 0.12 % solution 15 mL  15 mL Mouth Rinse BID Rush Farmer, MD   15 mL at 03/14/16 0811  . enoxaparin (LOVENOX) injection 75 mg  75 mg Subcutaneous Q12H Randa Spike, RPH   75 mg at 03/14/16 0534  . famotidine (PEPCID) tablet 20 mg  20 mg Oral Daily Rahul P Desai, PA-C   20 mg at 03/14/16 0811  . feeding supplement (BOOST / RESOURCE BREEZE) liquid 1 Container  1 Container Oral BID BM Rigoberto Noel, MD   1 Container at 03/14/16 1000  . insulin aspart (novoLOG) injection 0-9 Units  0-9 Units Subcutaneous Q4H Rahul P Desai, PA-C   2 Units at 03/13/16 2043  . MEDLINE mouth rinse  15 mL Mouth Rinse q12n4p Rush Farmer, MD   15 mL at 03/13/16 1600  . piperacillin-tazobactam (ZOSYN) IVPB 3.375 g  3.375 g Intravenous Q8H Anh P Pham, RPH   3.375 g at 03/14/16 0810  . potassium chloride 10 mEq in 100 mL IVPB  10 mEq Intravenous Q1 Hr x 4 Hosie Poisson, MD      . potassium chloride SA (K-DUR,KLOR-CON) CR tablet 40 mEq  40 mEq Oral BID WC Hosie Poisson, MD      . sodium chloride flush (NS) 0.9 % injection 10-40 mL  10-40 mL Intracatheter PRN Rush Farmer, MD   20 mL at 03/14/16 0400     Discharge Medications: Please see discharge summary for a list of discharge medications.  Relevant Imaging Results:  Relevant Lab Results:   Additional Information SSN: [id number]      Ivelise Castillo, Randall An, LCSW

## 2016-03-15 ENCOUNTER — Encounter (HOSPITAL_COMMUNITY): Payer: Self-pay | Admitting: Radiology

## 2016-03-15 ENCOUNTER — Inpatient Hospital Stay (HOSPITAL_COMMUNITY): Payer: Medicare Other

## 2016-03-15 LAB — GLUCOSE, CAPILLARY
GLUCOSE-CAPILLARY: 108 mg/dL — AB (ref 65–99)
GLUCOSE-CAPILLARY: 130 mg/dL — AB (ref 65–99)
GLUCOSE-CAPILLARY: 136 mg/dL — AB (ref 65–99)
GLUCOSE-CAPILLARY: 84 mg/dL (ref 65–99)
Glucose-Capillary: 133 mg/dL — ABNORMAL HIGH (ref 65–99)
Glucose-Capillary: 165 mg/dL — ABNORMAL HIGH (ref 65–99)

## 2016-03-15 LAB — BASIC METABOLIC PANEL
ANION GAP: 1 — AB (ref 5–15)
BUN: 5 mg/dL — ABNORMAL LOW (ref 6–20)
CALCIUM: 8.8 mg/dL — AB (ref 8.9–10.3)
CO2: 24 mmol/L (ref 22–32)
CREATININE: 0.4 mg/dL — AB (ref 0.44–1.00)
Chloride: 117 mmol/L — ABNORMAL HIGH (ref 101–111)
GFR calc Af Amer: >60 mL/min (ref >60)
GLUCOSE: 112 mg/dL — AB (ref 65–99)
Potassium: 3.2 mmol/L — ABNORMAL LOW (ref 3.5–5.1)
Sodium: 142 mmol/L (ref 135–145)

## 2016-03-15 MED ORDER — IOPAMIDOL (ISOVUE-300) INJECTION 61%
30.0000 mL | Freq: Once | INTRAVENOUS | Status: AC | PRN
  Administered 2016-03-15: 25 mL via URETHRAL

## 2016-03-15 NOTE — Progress Notes (Signed)
[facility] Progress Note     Subjective: Alert, oriented, and answering questions appropriately. Speech improved from my exam yesterday. Reports RLQ soreness. Tolerating PO. + BMs. Denies fever/chills.  Objective: Vital signs in last 24 hours: Temp:  [98.2 F (36.8 C)-98.5 F (36.9 C)] 98.2 F (36.8 C) (10/03 0641) Pulse Rate:  [68-100] 68 (10/03 0641) Resp:  [16] 16 (10/03 0641) BP: (145-154)/(60-67) 145/60 (10/03 0641) SpO2:  [98 %-100 %] 99 % (10/03 0641) Weight:  [186 lb (84.4 kg)] 186 lb (84.4 kg) (10/03 0641) Last BM Date: 03/13/16  Intake/Output from previous day: 10/02 0701 - 10/03 0700 In: [place] [P.O.:600; I.V.:1040; IV Piggyback:200] Out: 2000 [Urine:2000] Intake/Output this shift: No intake/output data recorded.  PE: Gen:  Alert, NAD, pleasant Card:  RRR, no M/G/R heard Pulm:  CTA, no W/R/R Abd: Soft, TTP of  RLQ, +BS, mild distention, no peritonitis  Lab Results:   Recent Labs  03/13/16 0345 03/14/16 0400  WBC 12.1* 11.2*  HGB 7.2* 8.2*  HCT 21.4* 24.6*  PLT 302 339   BMET  Recent Labs  03/14/16 2135 03/15/16 0335  NA 142 142  K 3.3* 3.2*  CL 116* 117*  CO2 24 24  GLUCOSE 135* 112*  BUN 6 5*  CREATININE 0.42* 0.40*  CALCIUM 8.6* 8.8*   PT/INR No results for input(s): LABPROT, INR in the last 72 hours. CMP     Component Value Date/Time   NA 142 03/15/2016 0335   NA 139 01/06/2016   K 3.2 (L) 03/15/2016 0335   CL 117 (H) 03/15/2016 0335   CO2 24 03/15/2016 0335   GLUCOSE 112 (H) 03/15/2016 0335   BUN 5 (L) 03/15/2016 0335   BUN 19 01/06/2016   CREATININE 0.40 (L) 03/15/2016 0335   CALCIUM 8.8 (L) 03/15/2016 0335   PROT 7.7 03/08/2016 1240   ALBUMIN 3.2 (L) 03/08/2016 1240   AST 21 03/08/2016 1240   ALT 13 (L) 03/08/2016 1240   ALKPHOS 40 03/08/2016 1240   BILITOT 0.6 03/08/2016 1240   GFRNONAA >60 03/15/2016 0335   GFRAA >60 03/15/2016 0335   Lipase  No results found for: LIPASE     Studies/Results: No  results found.  Anti-infectives: Anti-infectives    Start     Dose/Rate Route Frequency Ordered Stop   03/10/16 1600  vancomycin (VANCOCIN) 500 mg in sodium chloride 0.9 % 100 mL IVPB  Status:  Discontinued     500 mg 100 mL/hr over 60 Minutes Intravenous Every 48 hours 03/08/16 1338 03/09/16 0906   03/09/16 2200  anidulafungin (ERAXIS) 100 mg in sodium chloride 0.9 % 100 mL IVPB  Status:  Discontinued     100 mg over 90 Minutes Intravenous Every 24 hours 03/08/16 2135 03/10/16 0841   03/09/16 1300  vancomycin (VANCOCIN) 500 mg in sodium chloride 0.9 % 100 mL IVPB     500 mg 100 mL/hr over 60 Minutes Intravenous Every 12 hours 03/09/16 0906 03/12/16 1855   03/09/16 0800  piperacillin-tazobactam (ZOSYN) IVPB 3.375 g     3.375 g 12.5 mL/hr over 240 Minutes Intravenous Every 8 hours 03/09/16 0711     03/08/16 2200  anidulafungin (ERAXIS) 200 mg in sodium chloride 0.9 % 200 mL IVPB     200 mg over 180 Minutes Intravenous  Once 03/08/16 2135 03/09/16 0100   03/08/16 2000  piperacillin-tazobactam (ZOSYN) IVPB 2.25 g  Status:  Discontinued     2.25 g 100 mL/hr over 30 Minutes Intravenous Every 6 hours 03/08/16 1338  03/09/16 0711   03/08/16 1315  piperacillin-tazobactam (ZOSYN) IVPB 3.375 g     3.375 g 100 mL/hr over 30 Minutes Intravenous  Once 03/08/16 1310 03/08/16 1431   03/08/16 1315  vancomycin (VANCOCIN) IVPB 1000 mg/200 mL premix     1,000 mg 200 mL/hr over 60 Minutes Intravenous  Once 03/08/16 1310 03/08/16 1431       Assessment/Plan Intra-abdominal fluid collection Abdominal pain - CT abd/pelv w/ contrast 03/10/16: right anterior pelvic fluid collection 9.5 x 3.8 x 2.9 cm (enlarged from CT scan 9/26 - 8.4 x 3.7 x 2.7 cm)  - not amenable to percutaneous drainage by IR - Repeat CT scan 03/14/16 - stable fluid collection - afebrile; still with lower abdominal tenderness  - concern for possible bladder fistula- spoke with Dr. Tresa Moore who recommends CT cystogram to evaluate. Of note  - prior to hospital admission patient was scheduled for percutaneous removal of left kidney stone by Dr. Diona Fanti this week.    Leukocytosis - improving, 11.2  Hypokalemia - 3.2 Multiple Sclerosis (Dx 2002) UTI Neurogenic bladder - chronic indwelling foley Bilateral decubitus ulcers - stable AKI, CKD 3 Anemia of chronic disease PMH VTE - home xarelto on hold  FEN: carb modified diet ID: [email or web address]  DVT Proph: Lovenox, SCD's   Plan: stable fluid collection not amenable to percutaneous drainage with concern for bladder fistula as potential source.  continue IV abx; CT cystogram today to better evaluate fluid collection - appreciate Urology evaluating the patient.   LOS: 7 days    Jill Alexanders , [facility] Surgery 03/15/2016, 8:23 AM Pager: [phone] Consults: [phone] Mon-Fri 7:00 am-4:30 pm Sat-Sun 7:00 am-11:30 am

## 2016-03-15 NOTE — Progress Notes (Signed)
Subjective:  1 - Left Partial Staghorn Kidney Stone [name] scheudled for percutaneous removal by Dr. Diona Fanti 02/2016 but this was cancelled given present admission. Left neph tube remains in situ in good position by most recent imaging.   2 - Pelvic Fluid Collection  - enahncing-rim pelvic fluid collection by CT 02/2016 during admission for altermed mental status and infection, some question of bladder fistula by intake imaging. Dedicated CT cystogram 10/3 dose show dome contrast extravasation, no obvious bowel filling. Denies fecaluria, pneumaturia.   3 - Neurogenic Bladder - manages with chronic foley for flaccig baldder from MS for >1 year. Denies retention episodes.   Today "[name] " is seen in f/u above. She normall follows with Dr. Diona Fanti in our group who is at our [place] office today.   Objective: Vital signs in last 24 hours: Temp:  [98.2 F (36.8 C)-98.5 F (36.9 C)] 98.2 F (36.8 C) (10/03 0641) Pulse Rate:  [68-100] 68 (10/03 0641) Resp:  [16] 16 (10/03 0641) BP: (145-154)/(60-67) 145/60 (10/03 0641) SpO2:  [98 %-100 %] 99 % (10/03 0641) Weight:  [84.4 kg (186 lb)] 84.4 kg (186 lb) (10/03 0641) Last BM Date: 03/13/16  Intake/Output from previous day: 10/02 0701 - 10/03 0700 In: [place] [P.O.:600; I.V.:1040; IV Piggyback:200] Out: 2000 [Urine:2000] Intake/Output this shift: No intake/output data recorded.  General appearance: alert, cooperative and stigmata of advanced MS, but AOx3 and very pleasant.  Eyes: negative Nose: Nares normal. Septum midline. Mucosa normal. No drainage or sinus tenderness. Throat: lips, mucosa, and tongue normal; teeth and gums normal Neck: supple, symmetrical, trachea midline Back: symmetric, no curvature. ROM normal. No CVA tenderness., left neph tube inplace.  Resp: non-labored on minimal [place] O2.  Pelvic: external genitalia normal, vagina normal without discharge and foley with completely clear urine.  Extremities: some LE  wasting and flaccidit c/w known MS.  Incision/Wound:  Lab Results:   Recent Labs  03/13/16 0345 03/14/16 0400  WBC 12.1* 11.2*  HGB 7.2* 8.2*  HCT 21.4* 24.6*  PLT 302 339   BMET  Recent Labs  03/14/16 2135 03/15/16 0335  NA 142 142  K 3.3* 3.2*  CL 116* 117*  CO2 24 24  GLUCOSE 135* 112*  BUN 6 5*  CREATININE 0.42* 0.40*  CALCIUM 8.6* 8.8*   PT/INR No results for input(s): LABPROT, INR in the last 72 hours. ABG No results for input(s): PHART, HCO3 in the last 72 hours.  Invalid input(s): PCO2, PO2  Studies/Results: No results found.  Anti-infectives: Anti-infectives    Start     Dose/Rate Route Frequency Ordered Stop   03/10/16 1600  vancomycin (VANCOCIN) 500 mg in sodium chloride 0.9 % 100 mL IVPB  Status:  Discontinued     500 mg 100 mL/hr over 60 Minutes Intravenous Every 48 hours 03/08/16 1338 03/09/16 0906   03/09/16 2200  anidulafungin (ERAXIS) 100 mg in sodium chloride 0.9 % 100 mL IVPB  Status:  Discontinued     100 mg over 90 Minutes Intravenous Every 24 hours 03/08/16 2135 03/10/16 0841   03/09/16 1300  vancomycin (VANCOCIN) 500 mg in sodium chloride 0.9 % 100 mL IVPB     500 mg 100 mL/hr over 60 Minutes Intravenous Every 12 hours 03/09/16 0906 03/12/16 1855   03/09/16 0800  piperacillin-tazobactam (ZOSYN) IVPB 3.375 g     3.375 g 12.5 mL/hr over 240 Minutes Intravenous Every 8 hours 03/09/16 0711     03/08/16 2200  anidulafungin (ERAXIS) 200 mg in sodium chloride 0.9 %  200 mL IVPB     200 mg over 180 Minutes Intravenous  Once 03/08/16 2135 03/09/16 0100   03/08/16 2000  piperacillin-tazobactam (ZOSYN) IVPB 2.25 g  Status:  Discontinued     2.25 g 100 mL/hr over 30 Minutes Intravenous Every 6 hours 03/08/16 1338 03/09/16 0711   03/08/16 1315  piperacillin-tazobactam (ZOSYN) IVPB 3.375 g     3.375 g 100 mL/hr over 30 Minutes Intravenous  Once 03/08/16 1310 03/08/16 1431   03/08/16 1315  vancomycin (VANCOCIN) IVPB 1000 mg/200 mL premix      1,000 mg 200 mL/hr over 60 Minutes Intravenous  Once 03/08/16 1310 03/08/16 1431      Assessment/Plan:  1 - Left Partial Staghorn Kidney Stone - temporized with left neph tube. No intervention this admsision. Will still need left PCNL in elective setting, we will reschedule.   2 - Pelvic Fluid Collection  - unusual. DDX abscess fistulated to bladder dome v. Dome bladder dome neoplasm. NPO p MN for possible OR endoscopy / BX / cystogram. She has cleared systemic infectious paramaters and I fell would be safe to proceed at that time.   3 - Neurogenic Bladder - continue chronic foley for now.   [facility], Mata Rowen 03/15/2016

## 2016-03-15 NOTE — Progress Notes (Signed)
PROGRESS NOTE    [name]  [id number] DOB: 08-May-1961 DOA: 03/08/2016 PCP: No primary care provider on file.    Brief Narrative: 55 y/o F, SNF resident, with multiple sclerosis admitted on 9/26 with altered mental status and septic shock.  Potential sources of infection include pelvic abscess, UTI & decubitus ulcers (favor the first 2). She is hemodynamically stable. Repeat imaging demonstrates the pelvic abscess once again but it is not amendable to perc drain. For now we will cont abx, will plan for repeat imaging next week, however given on-going pain we will ask general surgery to see as well.     Assessment & Plan:   Active Problems:   Physical deconditioning   Pressure ulcer   Septic shock (HCC)   Pressure injury of skin   Septic shock:  From the pelvic abscess, requiring vasopressors. curently off pressors and transferred to [facility] service from PCCM on 9/30. Completed 7 days of iV zosyn.    Acute Kidney Injury on stage 3 CKD:: From volume depletion. Resolved.   Anemia: secondary to chronic disease.  S/p 2 units of prbc transfusion, repeat hemoglobin is 8.2   Pelvic abscess: CT cystogram on 10/3, showed 3.2 cm pelvic fluid collection. No definite contrast communication to adjacent bowel. Appearance is compatible with extraperitoneal bladder injury/ rupture and a small adjacent urinoma. Urology consulted, NPO past midnight for possible endoscopy and cystogram.   Hypokalemia and hypomagnesemia: Repeat potassium is 2.8. repleted and repeat tonight.     Leukocytosis: From the abscess. Improving.  Monitor.   Hypertension:  bp well controlled.    Diabetes Mellitus: CBG (last 3)   Recent Labs  03/15/16 0412 03/15/16 1236 03/15/16 1557  GLUCAP 108* 84 165*    Resume SSI.     DVT: On lovenox.   Physical deconditioning: PT EVAL recommended SNF.    DVT prophylaxis: Lovenox.  Code Status: full code.  Family Communication: none at bedside.    Disposition Plan: pending further eval. Back to SNF when stable.    Consultants:    surgery.   PCCM till 9/29  Urology.     Procedures: L IJ TLC 9/26>>>   Antimicrobials:  CULTURES: BCx2 9/26 >> negative so far.  UC 9/26 >> mult orgs   ANTIBIOTICS: Vanco 9/26 (empiric) >>  Zosyn 9/26 (empiric) >>   Subjective: Reports persistent pain in the lower abdomen.   Objective: Vitals:   03/14/16 1220 03/14/16 2111 03/15/16 0641 03/15/16 1430  BP: (!) 154/67 (!) 145/63 (!) 145/60 (!) 162/74  Pulse: 89 100 68 78  Resp: 16 16 16 16   Temp: 98.5 F (36.9 C) 98.3 F (36.8 C) 98.2 F (36.8 C) 98.4 F (36.9 C)  TempSrc: Oral Oral Oral Oral  SpO2: 100% 98% 99% 100%  Weight:   84.4 kg (186 lb)   Height:        Intake/Output Summary (Last 24 hours) at 03/15/16 1900 Last data filed at 03/15/16 1520  Gross per 24 hour  Intake              830 ml  Output             1976 ml  Net            -1146 ml   Filed Weights   03/13/16 0500 03/14/16 0500 03/15/16 0641  Weight: 74.5 kg (164 lb 3.9 oz) 74.4 kg (164 lb 0.4 oz) 84.4 kg (186 lb)    Examination:  General exam: Appears calm and  comfortable  Respiratory system: Clear to auscultation. Respiratory effort normal. Cardiovascular system: S1 & S2 heard, RRR. No JVD, murmurs, rubs, gallops or clicks. No pedal edema. Gastrointestinal system: Abdomen is mildly tender, soft. No organomegaly or masses felt. Normal bowel sounds heard. Ostomy in place.  Central nervous system: Alert and oriented. No focal neurological deficits. Skin: No rashes, lesions or ulcers Psychiatry: Judgement and insight appear normal. Mood & affect appropriate.     Data Reviewed: I have personally reviewed following labs and imaging studies  CBC:  Recent Labs Lab 03/10/16 1340 03/11/16 0406 03/12/16 0445 03/13/16 0345 03/14/16 0400  WBC 17.3* 14.5* 13.2* 12.1* 11.2*  NEUTROABS 14.2*  --   --   --   --   HGB 7.2* 7.3* 7.2* 7.2* 8.2*  HCT 21.0*  21.7* 21.3* 21.4* 24.6*  MCV 86.4 88.9 85.5 87.7 88.2  PLT 263 271 280 302 [id number]   Basic Metabolic Panel:  Recent Labs Lab 03/09/16 0305 03/10/16 0510 03/11/16 0406 03/12/16 0445 03/12/16 1020 03/12/16 1800 03/13/16 0345 03/13/16 1500 03/14/16 0400 03/14/16 2135 03/15/16 0335  NA 141 140 142 143  --   --  140  --  143 142 142  K 3.0* 2.8* 3.6 2.8*  --  3.5 3.1*  --  2.8* 3.3* 3.2*  CL 111 115* 119* 118*  --   --  115*  --  116* 116* 117*  CO2 22 21* 19* 22  --   --  21*  --  24 24 24   GLUCOSE 131* 81 76 146*  --   --  103*  --  107* 135* 112*  BUN 38* 15 9 7   --   --  5*  --  5* 6 5*  CREATININE 1.18* 0.49 0.52 0.50  --   --  0.34*  --  0.38* 0.42* 0.40*  CALCIUM 9.2 9.0 9.0 8.7*  --   --  8.8*  --  8.6* 8.6* 8.8*  MG 1.8 1.8 1.6*  --  1.4*  --   --  1.9  --   --   --   PHOS 2.8  --  2.0*  --   --   --   --   --   --   --   --    GFR: Estimated Creatinine Clearance: 83.3 mL/min (by C-G formula based on SCr of 0.4 mg/dL (L)). Liver Function Tests: No results for input(s): AST, ALT, ALKPHOS, BILITOT, PROT, ALBUMIN in the last 168 hours. No results for input(s): LIPASE, AMYLASE in the last 168 hours. No results for input(s): AMMONIA in the last 168 hours. Coagulation Profile:  Recent Labs Lab 03/08/16 2119 03/11/16 0406  INR 1.68 1.20   Cardiac Enzymes:  Recent Labs Lab 03/08/16 2119 03/09/16 0305  TROPONINI <0.03 <0.03   BNP (last 3 results) No results for input(s): PROBNP in the last 8760 hours. HbA1C: No results for input(s): HGBA1C in the last 72 hours. CBG:  Recent Labs Lab 03/14/16 1934 03/15/16 0005 03/15/16 0412 03/15/16 1236 03/15/16 1557  GLUCAP 121* 133* 108* 84 165*   Lipid Profile: No results for input(s): CHOL, HDL, LDLCALC, TRIG, CHOLHDL, LDLDIRECT in the last 72 hours. Thyroid Function Tests: No results for input(s): TSH, T4TOTAL, FREET4, T3FREE, THYROIDAB in the last 72 hours. Anemia Panel: No results for input(s): VITAMINB12,  FOLATE, FERRITIN, TIBC, IRON, RETICCTPCT in the last 72 hours. Sepsis Labs:  Recent Labs Lab 03/08/16 2118 03/09/16 0105 03/09/16 0305 03/10/16 0510  PROCALCITON  --   --  51.31 25.07  LATICACIDVEN 1.6 1.2  --   --     Recent Results (from the past 240 hour(s))  Blood Culture (routine x 2)     Status: None   Collection Time: 03/08/16 12:40 PM  Result Value Ref Range Status   Specimen Description BLOOD RIGHT ARM  Final   Special Requests BOTTLES DRAWN AEROBIC AND ANAEROBIC 5CC  Final   Culture   Final    NO GROWTH 5 DAYS Performed at [facility]    Report Status 03/13/2016 FINAL  Final  Urine culture     Status: Abnormal   Collection Time: 03/08/16 12:40 PM  Result Value Ref Range Status   Specimen Description URINE, CLEAN CATCH  Final   Special Requests NONE  Final   Culture MULTIPLE SPECIES PRESENT, SUGGEST RECOLLECTION (A)  Final   Report Status 03/10/2016 FINAL  Final  Blood Culture (routine x 2)     Status: None   Collection Time: 03/08/16  2:12 PM  Result Value Ref Range Status   Specimen Description BLOOD RIGHT ANTECUBITAL  Final   Special Requests BOTTLES DRAWN AEROBIC ONLY 2ML  Final   Culture   Final    NO GROWTH 5 DAYS Performed at [facility]    Report Status 03/13/2016 FINAL  Final  MRSA PCR Screening     Status: None   Collection Time: 03/08/16  6:16 PM  Result Value Ref Range Status   MRSA by PCR NEGATIVE NEGATIVE Final    Comment:        The GeneXpert MRSA Assay (FDA approved for NASAL specimens only), is one component of a comprehensive MRSA colonization surveillance program. It is not intended to diagnose MRSA infection nor to guide or monitor treatment for MRSA infections.          Radiology Studies: Ct Pelvis Wo Contrast  Result Date: 03/15/2016 CLINICAL DATA:  Anterior pelvic fluid collection concerning for abscess lower abdominal tenderness. Concern for fistula to the bladder. EXAM: CT PELVIS WITHOUT CONTRAST  TECHNIQUE: Multidetector CT imaging of the pelvis was performed following the standard protocol without intravenous contrast. Contrast was instilled into the bladder for a ten-day ACT in cystogram to evaluate for fistula. COMPARISON:  03/10/2016, 03/08/2016 FINDINGS: Urinary Tract: Noncontrast imaging performed of the pelvis followed by installation of contrast through the existing Foley catheter at 50 and 200 cc volume. Repeat imaging performed through the bladder. Upon instilling contrast within the bladder, there is extravasation of contrast outside the bladder lumen along the bladder dome which communicates with the small measured 3.2 cm fluid collection. No definite contrast demonstrated within the adjacent bowel. Postvoid imaging also performed and the extraluminal contrast persist however does not definitively communicate with a bowel lumen. Appearance is compatible with extraperitoneal bladder injury/rupture with a small adjacent fluid collection representing a urinoma rather than a true abscess. Difficult to completely exclude fistula to bowel. Consider further evaluation with dedicated fluoroscopic cystogram. Additionally, there are numerous small bladder diverticula and thickening of the bladder wall. Bowel: Negative for bowel obstruction. Scattered colonic diverticulosis. Normal appearing appendix. Limited assessment of pelvis without IV or bowel contrast. Vascular/Lymphatic: Iliac atherosclerosis noted. No aneurysm or retroperitoneal hemorrhage. Reproductive: Lobulated appearing uterus compatible with small fibroids. No adnexal mass. Other: No inguinal abnormality or hernia. Lower abdominal wall appears intact. Diffuse body wall anasarca noted. Musculoskeletal: No significant or acute osseous finding. IMPRESSION: CT cystogram with a total volume of 200 cc demonstrates extra luminal leakage of contrast along the  bladder dome which communicates with the measured 3.2 cm pelvic fluid collection. No definite  contrast communication to adjacent bowel. Appearance is compatible with extraperitoneal bladder injury/ rupture and a small adjacent urinoma. Difficult to completely exclude fistula. Consider additional evaluation with fluoroscopic cystogram. Thickened bladder wall with numerous diverticula. Electronically Signed   By: Jerilynn Mages.  Shick M.D.   On: 03/15/2016 13:44        Scheduled Meds: . sodium chloride   Intravenous Once  . atorvastatin  10 mg Oral q1800  . baclofen  10 mg Oral TID  . chlorhexidine  15 mL Mouth Rinse BID  . famotidine  20 mg Oral Daily  . feeding supplement  1 Container Oral BID BM  . insulin aspart  0-9 Units Subcutaneous Q4H  . mouth rinse  15 mL Mouth Rinse q12n4p  . piperacillin-tazobactam (ZOSYN)  IV  3.375 g Intravenous Q8H   Continuous Infusions: . sodium chloride 50 mL/hr at 03/13/16 2239     LOS: 7 days    Time spent: 25 minutes.     Hosie Poisson, MD [facility] Pager [phone]  If 7PM-7AM, please contact night-coverage [email or web address] Password [email or web address] 03/15/2016, 6:59 PM

## 2016-03-15 NOTE — Progress Notes (Signed)
Nutrition Follow-up  DOCUMENTATION CODES:   Not applicable  INTERVENTION:    Will continue Boost Breeze oral nutrition supplement BID. Each provides 250 kcal and 9 grams protein.  Will continue to monitor for nutrition-related needs.  NUTRITION DIAGNOSIS:   Inadequate protein intake related to other (see comment) (current diet order) as evidenced by other (see comment) (CLD does not meet estimated protein need).  Resolved  Increased nutrient needs (protein) related to wound healing as evidenced by stage II pressure injury to sacrum.  GOAL:   Patient will meet greater than or equal to 90% of their needs  Progressing  MONITOR:   PO intake, Supplement acceptance, Diet advancement, Weight trends, Labs, I & O's  ASSESSMENT:   55 y/o female with PMH of Multiple Sclerosis (dx 2002), GERD, malnutrition, DM with neuropathy, CKD III, DVT, chronic indwelling foley, stage 4 decubitus ulcer, anemia, HTN and lives at a SNF who presented to [facility] on 9/26 with altered mental status. Per report, she was found at [facility] with altered mental status/lethargic. She was last seen normal the evening prior.  EMS reported dried vomit on the patient when the picked her up.  Glucose was WNL. She was noted to have drainage from her decubitus wounds.  She was found to be febrile, tachycardic, and hypotensive. Sepsis therapy activated with fluid resuscitation and empiric antibiotics. CXR was negative for acute infectious process.  Spoke with pt at bedside who reports consuming Boost Breeze oral nutrition supplement BID. States the flavors are good and that she enjoys them. Pt states she was unable to finish breakfast due to being moved for a test. Per chart, pt has been consuming 0-90% of meals. Pt reports having just ordered lunch: ham and cheese sandwich with bacon and [place] ice. Pt states she is not hungry yet. Pt reports consuming pizza with Sprite Zero yesterday at dinner. Pt states the Sprite Zero  felt "prickly" in her mouth. Pt denies any difficulty chewing or swallowing. Encouraged continued consumption of Boost Breeze and PO intake.  Per chart, pt has gained 49# during current admission (36% weight gain since admission).  RD unable to complete NFPE at previous visit due to pt request. NFPE completed during this visit.  Medications reviewed and include Pepcid 20 mg tablet daily, sliding scale Novolog  Labs reviewed and include low potassium (3.2 mmol/L), elevated chloride (117 mmol/L) CBGs: 95-133 mg/dL  NFPE: Exam completed. No fat depletion, mild/moderate muscle depletion, and unable to assess edema due to pain.  Diet Order:  Diet Carb Modified Fluid consistency: Thin; Room service appropriate? Yes  Skin:  Wound (see comment) (Stage II pressure injury sacrum)  Last BM:  03/13/16  Height:   Ht Readings from Last 1 Encounters:  03/08/16 5\' 5"  (1.651 m)    Weight:   Wt Readings from Last 1 Encounters:  03/15/16 186 lb (84.4 kg)    Ideal Body Weight:  56.82 kg  BMI:  Body mass index is 30.95 kg/m.  Estimated Nutritional Needs:   Kcal:  1300-1500  Protein:  55-65 grams  Fluid:  >/= 1.5 L/day  EDUCATION NEEDS:   No education needs identified at this time  Jeb Levering Dietetic Intern Pager Number: [phone]

## 2016-03-16 ENCOUNTER — Encounter (HOSPITAL_COMMUNITY): Payer: Self-pay | Admitting: Urology

## 2016-03-16 ENCOUNTER — Inpatient Hospital Stay (HOSPITAL_COMMUNITY): Payer: Medicare Other | Admitting: Anesthesiology

## 2016-03-16 ENCOUNTER — Encounter (HOSPITAL_COMMUNITY)
Admission: EM | Disposition: A | Discharge status: Skilled Nursing Facility | Payer: Self-pay | Source: Home / Self Care | Attending: Internal Medicine

## 2016-03-16 DIAGNOSIS — E876 Hypokalemia: Secondary | ICD-10-CM

## 2016-03-16 DIAGNOSIS — N739 Female pelvic inflammatory disease, unspecified: Secondary | ICD-10-CM

## 2016-03-16 DIAGNOSIS — D649 Anemia, unspecified: Secondary | ICD-10-CM

## 2016-03-16 HISTORY — PX: TRANSURETHRAL RESECTION OF BLADDER TUMOR: SHX2575

## 2016-03-16 LAB — BASIC METABOLIC PANEL
Anion gap: 3 — ABNORMAL LOW (ref 5–15)
BUN: 6 mg/dL (ref 6–20)
CHLORIDE: 116 mmol/L — AB (ref 101–111)
CO2: 25 mmol/L (ref 22–32)
Calcium: 8.8 mg/dL — ABNORMAL LOW (ref 8.9–10.3)
Creatinine, Ser: 0.47 mg/dL (ref 0.44–1.00)
GFR calc Af Amer: >60 mL/min (ref >60)
GFR calc non Af Amer: >60 mL/min (ref >60)
Glucose, Bld: 109 mg/dL — ABNORMAL HIGH (ref 65–99)
POTASSIUM: 3 mmol/L — AB (ref 3.5–5.1)
SODIUM: 144 mmol/L (ref 135–145)

## 2016-03-16 LAB — GLUCOSE, CAPILLARY
GLUCOSE-CAPILLARY: 111 mg/dL — AB (ref 65–99)
GLUCOSE-CAPILLARY: 120 mg/dL — AB (ref 65–99)
Glucose-Capillary: 96 mg/dL (ref 65–99)
Glucose-Capillary: 105 mg/dL — ABNORMAL HIGH (ref 65–99)
Glucose-Capillary: 136 mg/dL — ABNORMAL HIGH (ref 65–99)

## 2016-03-16 LAB — MAGNESIUM: Magnesium: 1.5 mg/dL — ABNORMAL LOW (ref 1.7–2.4)

## 2016-03-16 SURGERY — TURBT (TRANSURETHRAL RESECTION OF BLADDER TUMOR)
Anesthesia: General

## 2016-03-16 MED ORDER — ONDANSETRON HCL 4 MG/2ML IJ SOLN
INTRAMUSCULAR | Status: DC | PRN
  Administered 2016-03-16: 4 mg via INTRAVENOUS

## 2016-03-16 MED ORDER — FENTANYL CITRATE (PF) 100 MCG/2ML IJ SOLN
INTRAMUSCULAR | Status: AC
  Filled 2016-03-16: qty 2

## 2016-03-16 MED ORDER — STERILE WATER FOR IRRIGATION IR SOLN
Status: DC | PRN
Start: 2016-03-16 — End: 2016-03-16
  Administered 2016-03-16: 3000 mL

## 2016-03-16 MED ORDER — POTASSIUM CHLORIDE 10 MEQ/100ML IV SOLN
10.0000 meq | INTRAVENOUS | Status: AC
  Administered 2016-03-16 (×3): 10 meq via INTRAVENOUS
  Filled 2016-03-16 (×4): qty 100

## 2016-03-16 MED ORDER — FENTANYL CITRATE (PF) 100 MCG/2ML IJ SOLN
25.0000 ug | INTRAMUSCULAR | Status: DC | PRN
  Administered 2016-03-16: 50 ug via INTRAVENOUS

## 2016-03-16 MED ORDER — PROPOFOL 10 MG/ML IV BOLUS
INTRAVENOUS | Status: AC
  Filled 2016-03-16: qty 20

## 2016-03-16 MED ORDER — PROPOFOL 10 MG/ML IV BOLUS
INTRAVENOUS | Status: DC | PRN
  Administered 2016-03-16: 30 mg via INTRAVENOUS
  Administered 2016-03-16: 120 mg via INTRAVENOUS

## 2016-03-16 MED ORDER — MAGNESIUM SULFATE 2 GM/50ML IV SOLN
2.0000 g | Freq: Once | INTRAVENOUS | Status: AC
  Administered 2016-03-16: 2 g via INTRAVENOUS
  Filled 2016-03-16 (×2): qty 50

## 2016-03-16 MED ORDER — PROMETHAZINE HCL 25 MG/ML IJ SOLN: 6.2500 mg | INTRAMUSCULAR | Status: DC | PRN

## 2016-03-16 MED ORDER — SODIUM CHLORIDE 0.9 % IV SOLN
INTRAVENOUS | Status: DC | PRN
Start: 2016-03-16 — End: 2016-03-16
  Administered 2016-03-16: 12:00:00 via INTRAVENOUS

## 2016-03-16 MED ORDER — FENTANYL CITRATE (PF) 100 MCG/2ML IJ SOLN
INTRAMUSCULAR | Status: DC | PRN
  Administered 2016-03-16 (×4): 25 ug via INTRAVENOUS

## 2016-03-16 MED ORDER — SODIUM CHLORIDE 0.9 % IV SOLN: INTRAVENOUS | Status: DC

## 2016-03-16 MED ORDER — POTASSIUM CHLORIDE 10 MEQ/100ML IV SOLN
10.0000 meq | Freq: Once | INTRAVENOUS | Status: AC
  Administered 2016-03-16: 10 meq via INTRAVENOUS

## 2016-03-16 SURGICAL SUPPLY — 20 items
BAG URINE DRAINAGE (UROLOGICAL SUPPLIES) ×2 IMPLANT
BAG URO CATCHER STRL LF (MISCELLANEOUS) ×2 IMPLANT
CATH FOLEY 2WAY SLVR  5CC 18FR (CATHETERS) ×1
CATH FOLEY 2WAY SLVR 30CC 24FR (CATHETERS) IMPLANT
CATH FOLEY 2WAY SLVR 5CC 18FR (CATHETERS) ×1 IMPLANT
ELECT REM PT RETURN 9FT ADLT (ELECTROSURGICAL) ×2
ELECTRODE REM PT RTRN 9FT ADLT (ELECTROSURGICAL) ×1 IMPLANT
EVACUATOR MICROVAS BLADDER (UROLOGICAL SUPPLIES) IMPLANT
GLOVE BIOGEL M 8.0 STRL (GLOVE) ×2 IMPLANT
GOWN STRL REUS W/ TWL XL LVL3 (GOWN DISPOSABLE) ×1 IMPLANT
GOWN STRL REUS W/TWL XL LVL3 (GOWN DISPOSABLE) ×3 IMPLANT
LOOP CUT BIPOLAR 24F LRG (ELECTROSURGICAL) IMPLANT
MANIFOLD NEPTUNE II (INSTRUMENTS) ×2 IMPLANT
NDL SAFETY ECLIPSE 18X1.5 (NEEDLE) IMPLANT
NEEDLE HYPO 18GX1.5 SHARP (NEEDLE)
PACK CYSTO (CUSTOM PROCEDURE TRAY) ×2 IMPLANT
SET ASPIRATION TUBING (TUBING) IMPLANT
SYRINGE IRR TOOMEY STRL 70CC (SYRINGE) IMPLANT
TUBING CONNECTING 10 (TUBING) ×2 IMPLANT
WATER STERILE IRR 3000ML UROMA (IV SOLUTION) IMPLANT

## 2016-03-16 NOTE — Transfer of Care (Signed)
Immediate Anesthesia Transfer of Care Note  Patient: [name]  Procedure(s) Performed: Procedure(s): CYSTOSCOPY BLADDER BIOPSY (N/A)  Patient Location: PACU  Anesthesia Type:General  Level of Consciousness:  sedated, patient cooperative and responds to stimulation  Airway & Oxygen Therapy:Patient Spontanous Breathing and Patient connected to face mask oxgen  Post-op Assessment:  Report given to PACU RN and Post -op Vital signs reviewed and stable  Post vital signs:  Reviewed and stable  Last Vitals:  Vitals:   03/16/16 1044 03/16/16 1255  BP: (!) 166/85 (!) 145/70  Pulse: 81 64  Resp: 15 19  Temp: 37 C     Complications: No apparent anesthesia complications

## 2016-03-16 NOTE — Care Management Important Message (Signed)
Important Message  Patient Details  Name: [name] MRN: [id number] Date of Birth: 10/13/1960   Medicare Important Message Given:  Yes    Camillo Flaming 03/16/2016, 2:17 [place] Message  Patient Details  Name: [name] MRN: [id number] Date of Birth: 09-04-1960   Medicare Important Message Given:  Yes    Camillo Flaming 03/16/2016, 2:14 PM

## 2016-03-16 NOTE — Anesthesia Procedure Notes (Signed)
Procedure Name: LMA Insertion Date/Time: 03/16/2016 12:03 PM Performed by: Anne Fu Pre-anesthesia Checklist: Patient identified, Emergency Drugs available, Suction available, Patient being monitored and Timeout performed Patient Re-evaluated:Patient Re-evaluated prior to inductionOxygen Delivery Method: Circle system utilized Preoxygenation: Pre-oxygenation with 100% oxygen Intubation Type: IV induction Ventilation: Mask ventilation without difficulty LMA: LMA inserted LMA Size: 4.0 Number of attempts: 1 Placement Confirmation: positive ETCO2 and breath sounds checked- equal and bilateral Tube secured with: Tape

## 2016-03-16 NOTE — Op Note (Signed)
Preoperative diagnosis: Pelvic abscess, bladder wall thickening with diverticulae, rule out urinary fistula  Postoperative diagnosis: Inflammatory lesion of bladder in the posterior dome area, rule out neoplasm  Principal procedure: Cystoscopy, multiple bladder biopsies.  Surgeon: Diona Fanti  Anesthesia: Gen.  Complications: None  Drain: 18 French Foley catheter to dependent drainage.  Specimen: Multiple biopsies of bladder lesion.  Indications: 55 year old female whom I've seen before for a staghorn calculus.  She was initially scheduled for percutaneous nephrolithotomy.  This week.  However, she was admitted to the hospital last week with sepsis.  Evaluation has revealed a phlegmon in her pelvis adjacent to her bladder.  She has been treated properly with antibiotics.  She has defervesced and is now stable.  Because of possible bladder involvement with this process, and possibility of fistula, she presents at this time for cystoscopy and bladder biopsy.  The procedure has been discussed with the patient this morning.  She is aware of the procedure as well as risks and complications, which are primarily related to her medical condition and anesthesia.  She desires to proceed.  Description of procedure: The patient was properly identified in the holding area.  She has been on proper IV antibiotics.  She was taken to the operating room where general anesthetic was administered.  She was placed in the dorsolithotomy position.  Genitalia and perineum were prepped and draped.  Proper timeout was performed.  A 23 French panendoscope was advanced into her bladder.  Bladder was inspected circumferentially.  There were multiple trabeculations, mainly at the dome of the bladder.  Both ureteral orifices were normal in configuration and location.  The only urothelial abnormality was an area perhaps 2-3 centimeters in diameter in the posterior dome of the bladder.  Most of the diverticula were adjacent to  this.  I did not see any specific fistulous passage, but if a fistula were present, it would more than likely be in the midst of the inflammatory lesion noted.  This was biopsied several times with the cold cup biopsy forceps.  These were sent for permanent specimen labeled "bladder lesion".  Minimal bleeding came following biopsy, although this was handled with the Bugbee electrode to perform cautery of the entire inflammatory lesion as well as the biopsy sites.  At this point, there was adequate hemostasis.  The scope was removed.  The bladder had been drained prior.  An 70 French Foley catheter was then placed and hooked to dependent drainage.  There the patient tolerated procedure well.  She was awakened and taken to PACU in stable condition.

## 2016-03-16 NOTE — Progress Notes (Signed)
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)  No Known Allergies  Patient Measurements: Height: 5\' 5"  (165.1 cm) Weight: 177 lb (80.3 kg) IBW/kg (Calculated) : 57 Heparin Dosing Wt: 72 kg  Vital Signs: Temp: 98.6 F (37 C) (10/04 1044) Temp Source: Oral (10/04 1044) BP: 166/85 (10/04 1044) Pulse Rate: 81 (10/04 1044)  Labs:  Recent Labs  03/13/16 1520  03/14/16 0400 03/14/16 2135 03/15/16 0335 03/16/16 0416  HGB  --   --  8.2*  --   --   --   HCT  --   --  24.6*  --   --   --   PLT  --   --  339  --   --   --   HEPARINUNFRC 0.33  --  <0.10*  --   --   --   CREATININE  --   < > 0.38* 0.42* 0.40* 0.47  < > = values in this interval not displayed.  Estimated Creatinine Clearance: 81.2 mL/min (by C-G formula based on SCr of 0.47 mg/dL).   Medications:   PTA Xarelto 20mg  daily - LD 9/25 @ 1700 Infusions:  . sodium chloride 50 mL/hr at 03/16/16 1127   Assessment: 55 yo F on chronic Xarelto for hx VTE which is being held due to acute illness/ renal failure (est CrCl <44ml/min). Last xarelto dose taken on 03/07/16 at 1700.  Pharmacy was initially consulted to dose Heparin, and is now consulted to convert to Lovenox.  RN reports no immediate surgical plans.  Today, 03/16/2016:  No active bleeding or complications reported/documented.  Hgb (10/2) low/stable (improved slightly after transfusion 9/28); Plt remain WNL  MD ordered PRBC transfusion on 10/1.  Tolerating diet, no nausea, having BMs.   Lovenox held for Cystogram this afternoon  Goal of Therapy:  Monitor platelets by anticoagulation protocol: Yes   Plan:   Lovenox held. Follow up resume Lovenox or Xarelto.  Note plan for Cystogram today to eval/rule out fistula  Lovenox 75 mg (1 mg/kg) SQ BID from 10/1, Xarelto on hold  Check CBC daily.   Minda Ditto PharmD Pager [phone] 03/16/2016, 11:34 AM

## 2016-03-16 NOTE — Progress Notes (Signed)
I have scheduled the patient for this afternoon for cystoscopy to evaluate her bladder for possible fistula as well as possible biopsy.

## 2016-03-16 NOTE — Progress Notes (Signed)
[facility] PROGRESS NOTE  [name] [id number] DOB: [birth year] DOA: 03/08/2016  PCP: No primary care provider on file.  Brief History/Interval Summary: 55 year old African-American female who resides at a skilled nursing facility and was a past medical history of multiple sclerosis was admitted on 9/26 with altered mental status and septic shock. It was felt that her presentation was due to pelvic abscess versus UTI versus decubitus ulcers. General surgery was consulted. CT scan was repeated, which suggested that the abscess abuts the urinary bladder. So urology was consulted as well.  Reason for Visit: Pelvic abscess  Consultants: Gen. surgery. Urology.  Procedures: Left internal jugular triple-lumen catheter 9/26  Antibiotics: Vancomycin and Zosyn. 9/26  Subjective/Interval History: Patient continues to have discomfort in her lower abdomen. She denies any nausea or vomiting. No diarrhea.  ROS: Denies any chest pain or shortness of breath.  Objective:  Vital Signs  Vitals:   03/16/16 0534 03/16/16 0816 03/16/16 1000 03/16/16 1044  BP: (!) 156/80   (!) 166/85  Pulse: 73   81  Resp: 14   15  Temp: 98.5 F (36.9 C)   98.6 F (37 C)  TempSrc: Oral   Oral  SpO2: 95% 96%  98%  Weight:   80.3 kg (177 lb)   Height:        Intake/Output Summary (Last 24 hours) at 03/16/16 1153 Last data filed at 03/16/16 1045  Gross per 24 hour  Intake             1070 ml  Output             1975 ml  Net             -905 ml   Filed Weights   03/14/16 0500 03/15/16 0641 03/16/16 1000  Weight: 74.4 kg (164 lb 0.4 oz) 84.4 kg (186 lb) 80.3 kg (177 lb)    General appearance: alert, cooperative, appears stated age and no distress Resp: clear to auscultation bilaterally Cardio: regular rate and rhythm, S1, S2 normal, no murmur, click, rub or gallop GI: Abdomen is soft but tender in the lower quadrants without any rebound, rigidity or guarding. No masses or organomegaly.  Bowel sounds are present. Extremities: Hyperpigmentation noted in bilateral lower extremities, which are chronic Skin: Hyperpigmentation bilateral lower extremity Neurologic: Unable to move her lower extremities due to her history of MS  Lab Results:  Data Reviewed: I have personally reviewed following labs and imaging studies  CBC:  Recent Labs Lab 03/10/16 1340 03/11/16 0406 03/12/16 0445 03/13/16 0345 03/14/16 0400  WBC 17.3* 14.5* 13.2* 12.1* 11.2*  NEUTROABS 14.2*  --   --   --   --   HGB 7.2* 7.3* 7.2* 7.2* 8.2*  HCT 21.0* 21.7* 21.3* 21.4* 24.6*  MCV 86.4 88.9 85.5 87.7 88.2  PLT 263 271 280 302 [id number]    Basic Metabolic Panel:  Recent Labs Lab 03/10/16 0510 03/11/16 0406  03/12/16 1020  03/13/16 0345 03/13/16 1500 03/14/16 0400 03/14/16 2135 03/15/16 0335 03/16/16 0416  NA 140 142  < >  --   --  140  --  143 142 142 144  K 2.8* 3.6  < >  --   < > 3.1*  --  2.8* 3.3* 3.2* 3.0*  CL 115* 119*  < >  --   --  115*  --  116* 116* 117* 116*  CO2 21* 19*  < >  --   --  21*  --  24 24 24 25   GLUCOSE 81 76  < >  --   --  103*  --  107* 135* 112* 109*  BUN 15 9  < >  --   --  5*  --  5* 6 5* 6  CREATININE 0.49 0.52  < >  --   --  0.34*  --  0.38* 0.42* 0.40* 0.47  CALCIUM 9.0 9.0  < >  --   --  8.8*  --  8.6* 8.6* 8.8* 8.8*  MG 1.8 1.6*  --  1.4*  --   --  1.9  --   --   --  1.5*  PHOS  --  2.0*  --   --   --   --   --   --   --   --   --   < > = values in this interval not displayed.  GFR: Estimated Creatinine Clearance: 81.2 mL/min (by C-G formula based on SCr of 0.47 mg/dL).   Coagulation Profile:  Recent Labs Lab 03/11/16 0406  INR 1.20    CBG:  Recent Labs Lab 03/15/16 1557 03/15/16 2021 03/15/16 2332 03/16/16 0357 03/16/16 0728  GLUCAP 165* 130* 136* 111* 96     Recent Results (from the past 240 hour(s))  Blood Culture (routine x 2)     Status: None   Collection Time: 03/08/16 12:40 PM  Result Value Ref Range Status   Specimen  Description BLOOD RIGHT ARM  Final   Special Requests BOTTLES DRAWN AEROBIC AND ANAEROBIC 5CC  Final   Culture   Final    NO GROWTH 5 DAYS Performed at [facility]    Report Status 03/13/2016 FINAL  Final  Urine culture     Status: Abnormal   Collection Time: 03/08/16 12:40 PM  Result Value Ref Range Status   Specimen Description URINE, CLEAN CATCH  Final   Special Requests NONE  Final   Culture MULTIPLE SPECIES PRESENT, SUGGEST RECOLLECTION (A)  Final   Report Status 03/10/2016 FINAL  Final  Blood Culture (routine x 2)     Status: None   Collection Time: 03/08/16  2:12 PM  Result Value Ref Range Status   Specimen Description BLOOD RIGHT ANTECUBITAL  Final   Special Requests BOTTLES DRAWN AEROBIC ONLY 2ML  Final   Culture   Final    NO GROWTH 5 DAYS Performed at [facility]    Report Status 03/13/2016 FINAL  Final  MRSA PCR Screening     Status: None   Collection Time: 03/08/16  6:16 PM  Result Value Ref Range Status   MRSA by PCR NEGATIVE NEGATIVE Final    Comment:        The GeneXpert MRSA Assay (FDA approved for NASAL specimens only), is one component of a comprehensive MRSA colonization surveillance program. It is not intended to diagnose MRSA infection nor to guide or monitor treatment for MRSA infections.       Radiology Studies: Ct Pelvis Wo Contrast  Result Date: 03/15/2016 CLINICAL DATA:  Anterior pelvic fluid collection concerning for abscess lower abdominal tenderness. Concern for fistula to the bladder. EXAM: CT PELVIS WITHOUT CONTRAST TECHNIQUE: Multidetector CT imaging of the pelvis was performed following the standard protocol without intravenous contrast. Contrast was instilled into the bladder for a ten-day ACT in cystogram to evaluate for fistula. COMPARISON:  03/10/2016, 03/08/2016 FINDINGS: Urinary Tract: Noncontrast imaging performed of the pelvis followed by installation of contrast through the existing Foley catheter  at 50 and 200 cc  volume. Repeat imaging performed through the bladder. Upon instilling contrast within the bladder, there is extravasation of contrast outside the bladder lumen along the bladder dome which communicates with the small measured 3.2 cm fluid collection. No definite contrast demonstrated within the adjacent bowel. Postvoid imaging also performed and the extraluminal contrast persist however does not definitively communicate with a bowel lumen. Appearance is compatible with extraperitoneal bladder injury/rupture with a small adjacent fluid collection representing a urinoma rather than a true abscess. Difficult to completely exclude fistula to bowel. Consider further evaluation with dedicated fluoroscopic cystogram. Additionally, there are numerous small bladder diverticula and thickening of the bladder wall. Bowel: Negative for bowel obstruction. Scattered colonic diverticulosis. Normal appearing appendix. Limited assessment of pelvis without IV or bowel contrast. Vascular/Lymphatic: Iliac atherosclerosis noted. No aneurysm or retroperitoneal hemorrhage. Reproductive: Lobulated appearing uterus compatible with small fibroids. No adnexal mass. Other: No inguinal abnormality or hernia. Lower abdominal wall appears intact. Diffuse body wall anasarca noted. Musculoskeletal: No significant or acute osseous finding. IMPRESSION: CT cystogram with a total volume of 200 cc demonstrates extra luminal leakage of contrast along the bladder dome which communicates with the measured 3.2 cm pelvic fluid collection. No definite contrast communication to adjacent bowel. Appearance is compatible with extraperitoneal bladder injury/ rupture and a small adjacent urinoma. Difficult to completely exclude fistula. Consider additional evaluation with fluoroscopic cystogram. Thickened bladder wall with numerous diverticula. Electronically Signed   By: Jerilynn Mages.  Shick M.D.   On: 03/15/2016 13:44     Medications:  Scheduled: . [MAR Hold] sodium  chloride   Intravenous Once  . [MAR Hold] atorvastatin  10 mg Oral q1800  . [MAR Hold] baclofen  10 mg Oral TID  . [MAR Hold] chlorhexidine  15 mL Mouth Rinse BID  . [MAR Hold] famotidine  20 mg Oral Daily  . [MAR Hold] feeding supplement  1 Container Oral BID BM  . [MAR Hold] insulin aspart  0-9 Units Subcutaneous Q4H  . [MAR Hold] mouth rinse  15 mL Mouth Rinse q12n4p  . [MAR Hold] piperacillin-tazobactam (ZOSYN)  IV  3.375 g Intravenous Q8H  . [MAR Hold] potassium chloride  10 mEq Intravenous Q1 Hr x 4   Continuous: . sodium chloride 50 mL/hr at 03/16/16 1127   PRN:[MAR Hold] sodium chloride, [MAR Hold] acetaminophen, [MAR Hold] albuterol, [MAR Hold] alum & mag hydroxide-simeth, [MAR Hold] sodium chloride flush  Assessment/Plan:  Active Problems:   Physical deconditioning   Pressure ulcer   Septic shock (HCC)   Pressure injury of skin    Pelvic abscess Patient was placed on broad-spectrum antibiotics with vancomycin and Zosyn. She initially presented with septic shock requiring pressors. She is currently hemodynamically stable. She had a repeat CT scan on 10/3 which showed pelvic collection again, but also showed findings of Urinary bladder injury/rupture. Urology was consulted. Patient to undergo cystoscopy today.  Septic shock. Secondary to the pelvic abscesses. She initially required vasopressors. No stable.  Acute kidney injury on chronic kidney disease stage III. Renal failure has resolved. She now has a normal renal function. Continue to monitor.  Hypokalemia and hypomagnesemia. Potassium remains low and will be repleted intravenously. She will also be given magnesium as that level was also found to be low.  Leukocytosis. Secondary to her acute infection. Improving.  History of essential hypertension. Continue to monitor blood pressures closely. Seems to be reasonably well controlled.  History of diabetes mellitus type 2 Continue SSI. Holding her oral  agents.  Normocytic anemia. Hemoglobin is low but stable. No evidence for any overt bleeding. Likely due to chronic disease. 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 She was on Xarelto prior to admission. It appears that she had been on this for many months. A nonocclusive acute DVT was diagnosed in September 2016. It appears that the Xarelto was continued due to her bedbound status. Currently Xarelto is on hold. Consider resuming in the next day or so if no surgical intervention is planned.  DVT Prophylaxis: Lovenox on hold for procedure    Code Status: Full code  Family Communication: Discussed with the patient. No family at bedside  Disposition Plan: Management as outlined above. Await urology input and procedure. Will likely return to skilled nursing facility when medically ready for discharge.    LOS: 8 days   [facility] Pager [phone] 03/16/2016, 11:53 AM  If 7PM-7AM, please contact night-coverage at [email or web address], password [facility]

## 2016-03-16 NOTE — Anesthesia Postprocedure Evaluation (Signed)
Anesthesia Post Note  Patient: [name]  Procedure(s) Performed: Procedure(s) (LRB): CYSTOSCOPY BLADDER BIOPSY (N/A)  Patient location during evaluation: PACU Anesthesia Type: General Level of consciousness: awake and alert Pain management: pain level controlled Vital Signs Assessment: post-procedure vital signs reviewed and stable Respiratory status: spontaneous breathing, nonlabored ventilation, respiratory function stable and patient connected to nasal cannula oxygen Cardiovascular status: blood pressure returned to baseline and stable Postop Assessment: no signs of nausea or vomiting Anesthetic complications: no    Last Vitals:  Vitals:   03/16/16 1255 03/16/16 1300  BP: (!) 145/70 (!) 145/81  Pulse: 64 66  Resp: 19 11  Temp: 36.6 C     Last Pain:  Vitals:   03/16/16 1255  TempSrc:   PainSc: 6                  Natelie Ostrosky S

## 2016-03-16 NOTE — Progress Notes (Signed)
Orders carried over post op that were before transfer from ICU.  Dr Maryland Pink made aware.  To discontinue orders.

## 2016-03-16 NOTE — Anesthesia Preprocedure Evaluation (Signed)
Anesthesia Evaluation  Patient identified by MRN, date of birth, ID band Patient awake    Reviewed: Allergy & Precautions, NPO status , Patient's Chart, lab work & pertinent test results  Airway Mallampati: II  TM Distance: >3 FB Neck ROM: Limited    Dental no notable dental hx.    Pulmonary neg pulmonary ROS, former smoker,    Pulmonary exam normal breath sounds clear to auscultation       Cardiovascular hypertension, Normal cardiovascular exam Rhythm:Regular Rate:Normal     Neuro/Psych MS  Neuromuscular disease negative psych ROS   GI/Hepatic negative GI ROS, Neg liver ROS,   Endo/Other  diabetes  Renal/GU Renal InsufficiencyRenal disease  negative genitourinary   Musculoskeletal negative musculoskeletal ROS (+)   Abdominal   Peds negative pediatric ROS (+)  Hematology  (+) anemia ,   Anesthesia Other Findings   Reproductive/Obstetrics negative OB ROS                             Anesthesia Physical Anesthesia Plan  ASA: III  Anesthesia Plan: General   Post-op Pain Management:    Induction: Intravenous  Airway Management Planned: LMA  Additional Equipment:   Intra-op Plan:   Post-operative Plan: Extubation in OR  Informed Consent: I have reviewed the patients History and Physical, chart, labs and discussed the procedure including the risks, benefits and alternatives for the proposed anesthesia with the patient or authorized representative who has indicated his/her understanding and acceptance.   Dental advisory given  Plan Discussed with: CRNA and Surgeon  Anesthesia Plan Comments:         Anesthesia Quick Evaluation

## 2016-03-17 ENCOUNTER — Inpatient Hospital Stay (HOSPITAL_COMMUNITY): Payer: Medicare Other

## 2016-03-17 DIAGNOSIS — N322 Vesical fistula, not elsewhere classified: Secondary | ICD-10-CM

## 2016-03-17 DIAGNOSIS — I10 Essential (primary) hypertension: Secondary | ICD-10-CM

## 2016-03-17 DIAGNOSIS — E1122 Type 2 diabetes mellitus with diabetic chronic kidney disease: Secondary | ICD-10-CM

## 2016-03-17 DIAGNOSIS — Z960 Presence of urogenital implants: Secondary | ICD-10-CM

## 2016-03-17 DIAGNOSIS — Z87891 Personal history of nicotine dependence: Secondary | ICD-10-CM

## 2016-03-17 DIAGNOSIS — Z9889 Other specified postprocedural states: Secondary | ICD-10-CM

## 2016-03-17 DIAGNOSIS — K651 Peritoneal abscess: Secondary | ICD-10-CM

## 2016-03-17 DIAGNOSIS — Z8619 Personal history of other infectious and parasitic diseases: Secondary | ICD-10-CM

## 2016-03-17 DIAGNOSIS — J810 Acute pulmonary edema: Secondary | ICD-10-CM

## 2016-03-17 DIAGNOSIS — E1140 Type 2 diabetes mellitus with diabetic neuropathy, unspecified: Secondary | ICD-10-CM

## 2016-03-17 DIAGNOSIS — B9689 Other specified bacterial agents as the cause of diseases classified elsewhere: Secondary | ICD-10-CM

## 2016-03-17 DIAGNOSIS — G35 Multiple sclerosis: Secondary | ICD-10-CM

## 2016-03-17 DIAGNOSIS — N739 Female pelvic inflammatory disease, unspecified: Secondary | ICD-10-CM | POA: Diagnosis present

## 2016-03-17 DIAGNOSIS — Z87440 Personal history of urinary (tract) infections: Secondary | ICD-10-CM

## 2016-03-17 DIAGNOSIS — N183 Chronic kidney disease, stage 3 (moderate): Secondary | ICD-10-CM

## 2016-03-17 LAB — BASIC METABOLIC PANEL
ANION GAP: 2 — AB (ref 5–15)
BUN: 5 mg/dL — ABNORMAL LOW (ref 6–20)
CHLORIDE: 114 mmol/L — AB (ref 101–111)
CO2: 26 mmol/L (ref 22–32)
Calcium: 8.6 mg/dL — ABNORMAL LOW (ref 8.9–10.3)
Creatinine, Ser: 0.37 mg/dL — ABNORMAL LOW (ref 0.44–1.00)
GFR calc Af Amer: >60 mL/min (ref >60)
Glucose, Bld: 96 mg/dL (ref 65–99)
POTASSIUM: 3.6 mmol/L (ref 3.5–5.1)
SODIUM: 142 mmol/L (ref 135–145)

## 2016-03-17 LAB — CBC
HCT: 23.4 % — ABNORMAL LOW (ref 36.0–46.0)
HEMOGLOBIN: 7.8 g/dL — AB (ref 12.0–15.0)
MCH: 29.8 pg (ref 26.0–34.0)
MCHC: 33.3 g/dL (ref 30.0–36.0)
MCV: 89.3 fL (ref 78.0–100.0)
PLATELETS: 443 10*3/uL — AB (ref 150–400)
RBC: 2.62 MIL/uL — AB (ref 3.87–5.11)
RDW: 16.7 % — ABNORMAL HIGH (ref 11.5–15.5)
WBC: 13.4 10*3/uL — AB (ref 4.0–10.5)

## 2016-03-17 LAB — GLUCOSE, CAPILLARY
GLUCOSE-CAPILLARY: 93 mg/dL (ref 65–99)
GLUCOSE-CAPILLARY: 133 mg/dL — AB (ref 65–99)
GLUCOSE-CAPILLARY: 169 mg/dL — AB (ref 65–99)

## 2016-03-17 MED ORDER — ENOXAPARIN SODIUM 80 MG/0.8ML ~~LOC~~ SOLN
80.0000 mg | Freq: Two times a day (BID) | SUBCUTANEOUS | Status: DC
  Administered 2016-03-17 – 2016-03-18 (×2): 80 mg via SUBCUTANEOUS
  Filled 2016-03-17 (×2): qty 0.8

## 2016-03-17 MED ORDER — FUROSEMIDE 10 MG/ML IJ SOLN
20.0000 mg | Freq: Once | INTRAMUSCULAR | Status: AC
  Administered 2016-03-17: 20 mg via INTRAVENOUS
  Filled 2016-03-17: qty 2

## 2016-03-17 MED ORDER — DEXTROSE 5 % IV SOLN
2.0000 g | Freq: Two times a day (BID) | INTRAVENOUS | Status: DC
  Administered 2016-03-17 – 2016-03-20 (×6): 2 g via INTRAVENOUS
  Filled 2016-03-17 (×7): qty 2

## 2016-03-17 MED ORDER — FUROSEMIDE 10 MG/ML IJ SOLN
40.0000 mg | Freq: Once | INTRAMUSCULAR | Status: AC
  Administered 2016-03-17: 40 mg via INTRAVENOUS
  Filled 2016-03-17: qty 4

## 2016-03-17 MED ORDER — ENOXAPARIN SODIUM 80 MG/0.8ML ~~LOC~~ SOLN: 80.0000 mg | Freq: Two times a day (BID) | SUBCUTANEOUS | Status: DC

## 2016-03-17 NOTE — Consult Note (Signed)
[facility]  Date of Admission:  03/08/2016  Date of Consult:  03/17/2016  Reason for Consult: Intra-abd abscess with bladder fistula Referring Physician: [place] Pink  Impression/Recommendation Intra-abd abscess with bladder fistula Recent Pseudomonas UTI Would change her anbx to cefepime Place PIC Repeat CT in 2 weeks (or as uro deems appropriate)  Multiple sclerosis Indwelling foley DM with neuropathy, nephropathy (CKD III) ARF - resolved.   Thank you so much for this interesting consult,  Available as needed.   Bobby Rumpf (pager) [phone] [email or web address]  [name] is an 55 y.o. female.  HPI: 55 yo F with hx of L nephrostomy tube 12-25-15 due to obstructive nephrolithiasis.  Her course was complicated by ED visit on 8-28 for blood in tube, she had a UCx showing P aeruginosa (pan-sens).  Her tube was changed on 02-19-16.  She returned to her SNF til 9-26 when she presented to ED with n/v. hypotension, elevated Cr (0.51 --> 2.57) and WBC of 18.4. She was started on vanco/zosyn and fluid resuscitated. She was found on CT to have Worsening inflammatory changes in the pelvis and right lower quadrant with interval development of peritoneal fluid collections probably representing abscess/phlegmon, and worsening inflammatory wall thickening of cecum, right lower quadrant small bowel loops, and the dome of the bladder. The abscess/phlegmon appears associated with the dome of the bladder and probably is related to urinary tract infection. She was adm to ICU and required pressor support.  She was able to come off pressors and was seen by Uro who did not feel the abscess was from her bladder.  By 9-28 she underwent repeat CT showing increase in size of abscess. This was not felt to be amenable to percutaneous drain.  Her UCx was polymicrobial, her BCx were negative.   She underwent repeat CT on 10-3 showing:  CT cystogram with a total  volume of 200 cc demonstrates extra luminal leakage of contrast along the bladder dome which communicates with the measured 3.2 cm pelvic fluid collection. No definite contrast communication to adjacent bowel. Appearance is compatible with extraperitoneal bladder injury/ rupture and a small adjacent urinoma. Difficult to completely exclude fistula. Consider additional evaluation with fluoroscopic cystogram. Thickened bladder wall with numerous diverticula.  She underwent cystoscopy 10-4 and had multiple bx taken. Results show cystitis glandularis, no malignancy.   She has been afebrile.   Past Medical History:  Diagnosis Date  . Acute renal failure superimposed on stage 3 chronic kidney disease ([place]) 07/21/2015  . Anemia    chronic   . Diabetes mellitus without complication ([place])   . Diabetic neuropathy ([place])   . DVT (deep venous thrombosis) ([place])   . DVT (deep venous thrombosis) ([place])   . GERD (gastroesophageal reflux disease)   . Hypertension   . Left nephrolithiasis   . Leukocytosis   . Lymph edema    chronic   . Malnutrition of moderate degree 07/21/2015  . Multiple sclerosis diagnosed 2002-not on Therapy any longer 06/26/2012  . Muscle weakness (generalized)   . Neuromuscular disorder ([place])   . Neuromuscular dysfunction of bladder   . Polyneuropathy ([place])   . Presence of indwelling urinary catheter   . Protein calorie malnutrition ([place])   . Stage 4 decubitus ulcer ([place]) 07/05/2015  . Stroke ([place])   . UTI (lower urinary tract infection)     Past Surgical History:  Procedure Laterality Date  . ESOPHAGOGASTRODUODENOSCOPY Left 01/02/2015   Procedure: ESOPHAGOGASTRODUODENOSCOPY (EGD);  Surgeon: Arta Silence, MD;  Location: [facility];  Service: Endoscopy;  Laterality: Left;  . HERNIA MESH REMOVAL    . IR GENERIC HISTORICAL  02/23/2016   IR NEPHROSTOMY EXCHANGE LEFT 02/23/2016 Sandi Mariscal, MD [facility]  . NEPHROSTOMY TUBE PLACEMENT ([place] HX)    . TRANSURETHRAL RESECTION  OF BLADDER TUMOR N/A 03/16/2016   Procedure: CYSTOSCOPY BLADDER BIOPSY;  Surgeon: Franchot Gallo, MD;  Location: [facility];  Service: Urology;  Laterality: N/A;  . TUBAL LIGATION    . tubes tided       No Known Allergies  Medications:  Scheduled: . atorvastatin  10 mg Oral q1800  . baclofen  10 mg Oral TID  . chlorhexidine  15 mL Mouth Rinse BID  . enoxaparin (LOVENOX) injection  80 mg Subcutaneous Q12H  . famotidine  20 mg Oral Daily  . feeding supplement  1 Container Oral BID BM  . insulin aspart  0-9 Units Subcutaneous Q4H  . mouth rinse  15 mL Mouth Rinse q12n4p  . piperacillin-tazobactam (ZOSYN)  IV  3.375 g Intravenous Q8H    Abtx:  Anti-infectives    Start     Dose/Rate Route Frequency Ordered Stop   03/10/16 1600  vancomycin (VANCOCIN) 500 mg in sodium chloride 0.9 % 100 mL IVPB  Status:  Discontinued     500 mg 100 mL/hr over 60 Minutes Intravenous Every 48 hours 03/08/16 1338 03/09/16 0906   03/09/16 2200  anidulafungin (ERAXIS) 100 mg in sodium chloride 0.9 % 100 mL IVPB  Status:  Discontinued     100 mg over 90 Minutes Intravenous Every 24 hours 03/08/16 2135 03/10/16 0841   03/09/16 1300  vancomycin (VANCOCIN) 500 mg in sodium chloride 0.9 % 100 mL IVPB     500 mg 100 mL/hr over 60 Minutes Intravenous Every 12 hours 03/09/16 0906 03/12/16 1855   03/09/16 0800  piperacillin-tazobactam (ZOSYN) IVPB 3.375 g     3.375 g 12.5 mL/hr over 240 Minutes Intravenous Every 8 hours 03/09/16 0711     03/08/16 2200  anidulafungin (ERAXIS) 200 mg in sodium chloride 0.9 % 200 mL IVPB     200 mg over 180 Minutes Intravenous  Once 03/08/16 2135 03/09/16 0100   03/08/16 2000  piperacillin-tazobactam (ZOSYN) IVPB 2.25 g  Status:  Discontinued     2.25 g 100 mL/hr over 30 Minutes Intravenous Every 6 hours 03/08/16 1338 03/09/16 0711   03/08/16 1315  piperacillin-tazobactam (ZOSYN) IVPB 3.375 g     3.375 g 100 mL/hr over 30 Minutes Intravenous  Once 03/08/16 1310 03/08/16 1431    03/08/16 1315  vancomycin (VANCOCIN) IVPB 1000 mg/200 mL premix     1,000 mg 200 mL/hr over 60 Minutes Intravenous  Once 03/08/16 1310 03/08/16 1431      Total days of antibiotics: 9 zosyn          Social History:  reports that she quit smoking about 15 months ago. Her smoking use included Cigarettes. She has a 20.00 pack-year smoking history. She has never used smokeless tobacco. She reports that she drinks about 3.0 oz of alcohol per week . She reports that she does not use drugs.  Family History  Problem Relation Age of Onset  . Diabetes Mother   . Alzheimer's disease Mother   . Diabetes Father   . Diabetes Sister   . Scoliosis Sister     General ROS: denies dysphagia, denies abd pain, normal BM this AM, denies allergies, see HPI.    Blood pressure 126/74, pulse 68, temperature  98.2 F (36.8 C), temperature source Oral, resp. rate 14, height '5\' 5"'  (1.651 m), weight 83 kg (183 lb), SpO2 100 %. General appearance: alert, cooperative and no distress Eyes: negative findings: conjunctivae and sclerae normal and pupils equal, round, reactive to light and accomodation Throat: normal findings: oropharynx pink & moist without lesions or evidence of thrush Neck: no adenopathy and supple, symmetrical, trachea midline Lungs: clear to auscultation bilaterally Heart: regular rate and rhythm Abdomen: normal findings: bowel sounds normal and soft, non-tender and R flank urostomy dressed.  Extremities: anasrca. unable to move LE.    Results for orders placed or performed during the hospital encounter of 03/08/16 (from the past 48 hour(s))  Glucose, capillary     Status: Abnormal   Collection Time: 03/15/16  3:57 PM  Result Value Ref Range   Glucose-Capillary 165 (H) 65 - 99 mg/dL  Glucose, capillary     Status: Abnormal   Collection Time: 03/15/16  8:21 PM  Result Value Ref Range   Glucose-Capillary 130 (H) 65 - 99 mg/dL  Glucose, capillary     Status: Abnormal   Collection Time:  03/15/16 11:32 PM  Result Value Ref Range   Glucose-Capillary 136 (H) 65 - 99 mg/dL  Glucose, capillary     Status: Abnormal   Collection Time: 03/16/16  3:57 AM  Result Value Ref Range   Glucose-Capillary 111 (H) 65 - 99 mg/dL  Basic metabolic panel     Status: Abnormal   Collection Time: 03/16/16  4:16 AM  Result Value Ref Range   Sodium 144 135 - 145 mmol/L   Potassium 3.0 (L) 3.5 - 5.1 mmol/L   Chloride 116 (H) 101 - 111 mmol/L   CO2 25 22 - 32 mmol/L   Glucose, Bld 109 (H) 65 - 99 mg/dL   BUN 6 6 - 20 mg/dL   Creatinine, Ser 0.47 0.44 - 1.00 mg/dL   Calcium 8.8 (L) 8.9 - 10.3 mg/dL   GFR calc non Af Amer >60 >60 mL/min   GFR calc Af Amer >60 >60 mL/min    Comment: (NOTE) The eGFR has been calculated using the CKD EPI equation. This calculation has not been validated in all clinical situations. eGFR's persistently <60 mL/min signify possible Chronic Kidney Disease.    Anion gap 3 (L) 5 - 15  Magnesium     Status: Abnormal   Collection Time: 03/16/16  4:16 AM  Result Value Ref Range   Magnesium 1.5 (L) 1.7 - 2.4 mg/dL  Glucose, capillary     Status: None   Collection Time: 03/16/16  7:28 AM  Result Value Ref Range   Glucose-Capillary 96 65 - 99 mg/dL  Glucose, capillary     Status: Abnormal   Collection Time: 03/16/16  4:18 PM  Result Value Ref Range   Glucose-Capillary 105 (H) 65 - 99 mg/dL  Glucose, capillary     Status: Abnormal   Collection Time: 03/16/16  7:53 PM  Result Value Ref Range   Glucose-Capillary 136 (H) 65 - 99 mg/dL  Glucose, capillary     Status: Abnormal   Collection Time: 03/17/16 12:00 AM  Result Value Ref Range   Glucose-Capillary 120 (H) 65 - 99 mg/dL  Glucose, capillary     Status: None   Collection Time: 03/17/16  4:02 AM  Result Value Ref Range   Glucose-Capillary 93 65 - 99 mg/dL  CBC     Status: Abnormal   Collection Time: 03/17/16  4:25 AM  Result Value Ref  Range   WBC 13.4 (H) 4.0 - 10.5 K/uL   RBC 2.62 (L) 3.87 - 5.11 MIL/uL    Hemoglobin 7.8 (L) 12.0 - 15.0 g/dL   HCT 23.4 (L) 36.0 - 46.0 %   MCV 89.3 78.0 - 100.0 fL   MCH 29.8 26.0 - 34.0 pg   MCHC 33.3 30.0 - 36.0 g/dL   RDW 16.7 (H) 11.5 - 15.5 %   Platelets 443 (H) 150 - 400 K/uL  Basic metabolic panel     Status: Abnormal   Collection Time: 03/17/16  4:25 AM  Result Value Ref Range   Sodium 142 135 - 145 mmol/L   Potassium 3.6 3.5 - 5.1 mmol/L   Chloride 114 (H) 101 - 111 mmol/L   CO2 26 22 - 32 mmol/L   Glucose, Bld 96 65 - 99 mg/dL   BUN 5 (L) 6 - 20 mg/dL   Creatinine, Ser 0.37 (L) 0.44 - 1.00 mg/dL   Calcium 8.6 (L) 8.9 - 10.3 mg/dL   GFR calc non Af Amer >60 >60 mL/min   GFR calc Af Amer >60 >60 mL/min    Comment: (NOTE) The eGFR has been calculated using the CKD EPI equation. This calculation has not been validated in all clinical situations. eGFR's persistently <60 mL/min signify possible Chronic Kidney Disease.    Anion gap 2 (L) 5 - 15    Comment: REPEATED TO VERIFY      Component Value Date/Time   SDES BLOOD RIGHT ANTECUBITAL 03/08/2016 1412   SPECREQUEST BOTTLES DRAWN AEROBIC ONLY 2ML 03/08/2016 1412   CULT  03/08/2016 1412    NO GROWTH 5 DAYS Performed at [place] 03/13/2016 FINAL 03/08/2016 1412   Dg Chest Port 1 View  Result Date: 03/17/2016 CLINICAL DATA:  Cough. EXAM: PORTABLE CHEST 1 VIEW COMPARISON:  Radiograph of March 10, 2016. FINDINGS: Stable cardiomediastinal silhouette. Left internal jugular catheter is unchanged with distal tip in expected position of the SVC. No pneumothorax is noted. Increased bilateral perihilar and basilar interstitial densities are noted concerning for edema. Stable mild left pleural effusion is noted. Bony thorax is unremarkable. IMPRESSION: Bilateral perihilar and basilar edema is noted. Stable mild left pleural effusion. Electronically Signed   By: Marijo Conception, M.D.   On: 03/17/2016 09:43   Recent Results (from the past 240 hour(s))  Blood Culture (routine x  2)     Status: None   Collection Time: 03/08/16 12:40 PM  Result Value Ref Range Status   Specimen Description BLOOD RIGHT ARM  Final   Special Requests BOTTLES DRAWN AEROBIC AND ANAEROBIC 5CC  Final   Culture   Final    NO GROWTH 5 DAYS Performed at [facility]    Report Status 03/13/2016 FINAL  Final  Urine culture     Status: Abnormal   Collection Time: 03/08/16 12:40 PM  Result Value Ref Range Status   Specimen Description URINE, CLEAN CATCH  Final   Special Requests NONE  Final   Culture MULTIPLE SPECIES PRESENT, SUGGEST RECOLLECTION (A)  Final   Report Status 03/10/2016 FINAL  Final  Blood Culture (routine x 2)     Status: None   Collection Time: 03/08/16  2:12 PM  Result Value Ref Range Status   Specimen Description BLOOD RIGHT ANTECUBITAL  Final   Special Requests BOTTLES DRAWN AEROBIC ONLY 2ML  Final   Culture   Final    NO GROWTH 5 DAYS Performed at [facility]  Report Status 03/13/2016 FINAL  Final  MRSA PCR Screening     Status: None   Collection Time: 03/08/16  6:16 PM  Result Value Ref Range Status   MRSA by PCR NEGATIVE NEGATIVE Final    Comment:        The GeneXpert MRSA Assay (FDA approved for NASAL specimens only), is one component of a comprehensive MRSA colonization surveillance program. It is not intended to diagnose MRSA infection nor to guide or monitor treatment for MRSA infections.       03/17/2016, 3:28 PM     LOS: 9 days    Records and images were personally reviewed where available.

## 2016-03-17 NOTE — Progress Notes (Signed)
[facility] PROGRESS NOTE  [name] [id number] DOB: Jan 10, 1961 DOA: 03/08/2016  PCP: No primary care provider on file.  Brief History/Interval Summary: 55 year old African-American female who resides at a skilled nursing facility and was a past medical history of multiple sclerosis was admitted on 9/26 with altered mental status and septic shock. It was felt that her presentation was due to pelvic abscess versus UTI versus decubitus ulcers. General surgery was consulted. CT scan was repeated, which suggested that the abscess abuts the urinary bladder. So urology was consulted as well.   Reason for Visit: Pelvic abscess  Consultants: Gen. surgery. Urology. Infectious disease  Procedures:  Left internal jugular triple-lumen catheter 9/26 Cystoscopy, multiple bladder biopsies. 10/4  Antibiotics: Vancomycin and Zosyn. 9/26  Subjective/Interval History: Patient feels about the same. Continues to have some discomfort in the lower abdomen. Has been having a cough as well since yesterday. She denies any nausea or vomiting. No diarrhea.  ROS: Denies any chest pain or shortness of breath.  Objective:  Vital Signs  Vitals:   03/16/16 1556 03/16/16 1700 03/16/16 2106 03/17/16 0558  BP: (!) 143/66 (!) 143/72 (!) 156/77 126/74  Pulse: 72 78 80 68  Resp: 16 16 16 14   Temp: 98 F (36.7 C) 98.4 F (36.9 C) 98.3 F (36.8 C) 98.2 F (36.8 C)  TempSrc: Oral Oral Oral Oral  SpO2: 100% 100% 100% 100%  Weight:    83 kg (183 lb)  Height:        Intake/Output Summary (Last 24 hours) at 03/17/16 0809 Last data filed at 03/17/16 0559  Gross per 24 hour  Intake             1420 ml  Output             2480 ml  Net            -1060 ml   Filed Weights   03/15/16 0641 03/16/16 1000 03/17/16 0558  Weight: 84.4 kg (186 lb) 80.3 kg (177 lb) 83 kg (183 lb)    General appearance: alert, cooperative, appears stated age and no distress Resp: Few scattered wheezes bilaterally.  Diminished air entry at the bases. Cardio: regular rate and rhythm, S1, S2 normal, no murmur, click, rub or gallop GI: Abdomen is soft but tender in the lower quadrants without any rebound, rigidity or guarding. No masses or organomegaly. Bowel sounds are present. Extremities: Hyperpigmentation noted in bilateral lower extremities, which are chronic Skin: Hyperpigmentation bilateral lower extremity Neurologic: Unable to move her lower extremities due to her history of MS  Lab Results:  Data Reviewed: I have personally reviewed following labs and imaging studies  CBC:  Recent Labs Lab 03/10/16 1340 03/11/16 0406 03/12/16 0445 03/13/16 0345 03/14/16 0400 03/17/16 0425  WBC 17.3* 14.5* 13.2* 12.1* 11.2* 13.4*  NEUTROABS 14.2*  --   --   --   --   --   HGB 7.2* 7.3* 7.2* 7.2* 8.2* 7.8*  HCT 21.0* 21.7* 21.3* 21.4* 24.6* 23.4*  MCV 86.4 88.9 85.5 87.7 88.2 89.3  PLT 263 271 280 302 339 443*    Basic Metabolic Panel:  Recent Labs Lab 03/11/16 0406  03/12/16 1020  03/13/16 1500 03/14/16 0400 03/14/16 2135 03/15/16 0335 03/16/16 0416 03/17/16 0425  NA 142  < >  --   < >  --  143 142 142 144 142  K 3.6  < >  --   < >  --  2.8* 3.3* 3.2* 3.0*  3.6  CL 119*  < >  --   < >  --  116* 116* 117* 116* 114*  CO2 19*  < >  --   < >  --  24 24 24 25 26   GLUCOSE 76  < >  --   < >  --  107* 135* 112* 109* 96  BUN 9  < >  --   < >  --  5* 6 5* 6 5*  CREATININE 0.52  < >  --   < >  --  0.38* 0.42* 0.40* 0.47 0.37*  CALCIUM 9.0  < >  --   < >  --  8.6* 8.6* 8.8* 8.8* 8.6*  MG 1.6*  --  1.4*  --  1.9  --   --   --  1.5*  --   PHOS 2.0*  --   --   --   --   --   --   --   --   --   < > = values in this interval not displayed.  GFR: Estimated Creatinine Clearance: 82.6 mL/min (by C-G formula based on SCr of 0.37 mg/dL (L)).   Coagulation Profile:  Recent Labs Lab 03/11/16 0406  INR 1.20    CBG:  Recent Labs Lab 03/16/16 0728 03/16/16 1618 03/16/16 1953 03/17/16 0000  03/17/16 0402  GLUCAP 96 105* 136* 120* 93     Recent Results (from the past 240 hour(s))  Blood Culture (routine x 2)     Status: None   Collection Time: 03/08/16 12:40 PM  Result Value Ref Range Status   Specimen Description BLOOD RIGHT ARM  Final   Special Requests BOTTLES DRAWN AEROBIC AND ANAEROBIC 5CC  Final   Culture   Final    NO GROWTH 5 DAYS Performed at [facility]    Report Status 03/13/2016 FINAL  Final  Urine culture     Status: Abnormal   Collection Time: 03/08/16 12:40 PM  Result Value Ref Range Status   Specimen Description URINE, CLEAN CATCH  Final   Special Requests NONE  Final   Culture MULTIPLE SPECIES PRESENT, SUGGEST RECOLLECTION (A)  Final   Report Status 03/10/2016 FINAL  Final  Blood Culture (routine x 2)     Status: None   Collection Time: 03/08/16  2:12 PM  Result Value Ref Range Status   Specimen Description BLOOD RIGHT ANTECUBITAL  Final   Special Requests BOTTLES DRAWN AEROBIC ONLY 2ML  Final   Culture   Final    NO GROWTH 5 DAYS Performed at [facility]    Report Status 03/13/2016 FINAL  Final  MRSA PCR Screening     Status: None   Collection Time: 03/08/16  6:16 PM  Result Value Ref Range Status   MRSA by PCR NEGATIVE NEGATIVE Final    Comment:        The GeneXpert MRSA Assay (FDA approved for NASAL specimens only), is one component of a comprehensive MRSA colonization surveillance program. It is not intended to diagnose MRSA infection nor to guide or monitor treatment for MRSA infections.       Radiology Studies: Ct Pelvis Wo Contrast  Result Date: 03/15/2016 CLINICAL DATA:  Anterior pelvic fluid collection concerning for abscess lower abdominal tenderness. Concern for fistula to the bladder. EXAM: CT PELVIS WITHOUT CONTRAST TECHNIQUE: Multidetector CT imaging of the pelvis was performed following the standard protocol without intravenous contrast. Contrast was instilled into the bladder for a ten-day  ACT in  cystogram to evaluate for fistula. COMPARISON:  03/10/2016, 03/08/2016 FINDINGS: Urinary Tract: Noncontrast imaging performed of the pelvis followed by installation of contrast through the existing Foley catheter at 50 and 200 cc volume. Repeat imaging performed through the bladder. Upon instilling contrast within the bladder, there is extravasation of contrast outside the bladder lumen along the bladder dome which communicates with the small measured 3.2 cm fluid collection. No definite contrast demonstrated within the adjacent bowel. Postvoid imaging also performed and the extraluminal contrast persist however does not definitively communicate with a bowel lumen. Appearance is compatible with extraperitoneal bladder injury/rupture with a small adjacent fluid collection representing a urinoma rather than a true abscess. Difficult to completely exclude fistula to bowel. Consider further evaluation with dedicated fluoroscopic cystogram. Additionally, there are numerous small bladder diverticula and thickening of the bladder wall. Bowel: Negative for bowel obstruction. Scattered colonic diverticulosis. Normal appearing appendix. Limited assessment of pelvis without IV or bowel contrast. Vascular/Lymphatic: Iliac atherosclerosis noted. No aneurysm or retroperitoneal hemorrhage. Reproductive: Lobulated appearing uterus compatible with small fibroids. No adnexal mass. Other: No inguinal abnormality or hernia. Lower abdominal wall appears intact. Diffuse body wall anasarca noted. Musculoskeletal: No significant or acute osseous finding. IMPRESSION: CT cystogram with a total volume of 200 cc demonstrates extra luminal leakage of contrast along the bladder dome which communicates with the measured 3.2 cm pelvic fluid collection. No definite contrast communication to adjacent bowel. Appearance is compatible with extraperitoneal bladder injury/ rupture and a small adjacent urinoma. Difficult to completely exclude fistula.  Consider additional evaluation with fluoroscopic cystogram. Thickened bladder wall with numerous diverticula. Electronically Signed   By: Jerilynn Mages.  Shick M.D.   On: 03/15/2016 13:44     Medications:  Scheduled: . atorvastatin  10 mg Oral q1800  . baclofen  10 mg Oral TID  . chlorhexidine  15 mL Mouth Rinse BID  . famotidine  20 mg Oral Daily  . feeding supplement  1 Container Oral BID BM  . insulin aspart  0-9 Units Subcutaneous Q4H  . mouth rinse  15 mL Mouth Rinse q12n4p  . piperacillin-tazobactam (ZOSYN)  IV  3.375 g Intravenous Q8H   Continuous: . sodium chloride 50 mL/hr at 03/16/16 2304   [id number], albuterol, alum & mag hydroxide-simeth, sodium chloride flush  Assessment/Plan:  Active Problems:   Physical deconditioning   Pressure ulcer   Septic shock (HCC)   Pressure injury of skin    Pelvic abscess Patient was placed on broad-spectrum antibiotics with vancomycin and Zosyn. She initially presented with septic shock requiring pressors. She is currently hemodynamically stable. She had a repeat CT scan on 10/3 which showed pelvic collection again, but also showed findings of Urinary bladder injury/rupture. Urology was consulted. Patient underwent cystoscopy on 10/4 and multiple biopsies were taken from a lesion identified in the urinary bladder. Pathology is pending. All the cultures have been negative so far. The only positive culture that we have is from August when the urine culture revealed Pseudomonas. Will consult infectious disease to assist with management.  Cough and wheezing/pulmonary edema Chest x-ray revealed evidence for fluid overload. Stop IV fluids. Give her intravenous Lasix. Echocardiogram from June showed normal systolic function. Fluid overload could be due to aggressive fluid resuscitation provided when she was in septic shock.  Septic shock. Secondary to the pelvic abscesses. She initially required vasopressors. Now stable.  Acute kidney injury on  chronic kidney disease stage III. Renal failure has resolved. She now has a normal renal function. Continue  to monitor.  Hypokalemia and hypomagnesemia. Potassium and magnesium repleted.  Leukocytosis. Secondary to her acute infection. Improving.  History of essential hypertension. Continue to monitor blood pressures closely. Seems to be reasonably well controlled.  History of diabetes mellitus type 2 Continue SSI. Holding her oral agents.  Normocytic anemia. Hemoglobin is low but stable. No evidence for any overt bleeding. Likely due to chronic disease.   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 She was on Xarelto prior to admission. It appears that she had been on this for many months. A nonocclusive acute DVT was diagnosed in September 2016. It appears that the Xarelto was continued due to her bedbound status. Currently Xarelto is on hold. She was on Lovenox on admission which was held prior to her urological procedure. Okay to resume Lovenox for now since it remains unclear if further procedures are planned.   DVT Prophylaxis: Lovenox Code Status: Full code  Family Communication: Discussed with the patient. No family at bedside  Disposition Plan: Management as outlined above. Will likely return to skilled nursing facility when medically ready for discharge.    LOS: 9 days   [facility] Pager [phone] 03/17/2016, 8:09 AM  If 7PM-7AM, please contact night-coverage at [email or web address], password [facility]

## 2016-03-17 NOTE — Progress Notes (Signed)
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   Medications:   PTA Xarelto 20mg  daily - LD 9/25 @ 1700 Infusions:    Assessment: 55 yo F on chronic Xarelto for hx VTE which is being held due to acute illness. This was converted to full-dose Lovenox during current hospitalization, then held the evening of 10/3 for procedure (cystoscopy with multiple bladder biopsies).   Patient now approaching 24 hr post-procedure and orders are received to resume Lovenox.  Goal of Therapy:  Monitor platelets by anticoagulation protocol: Yes   Plan:   1. Resume Lovenox 1mg /kg (80 mg) SQ q12h this PM 2. Follow H/H, pltc, and for any reports of bleeding. 3. Follow-up on timing of transition from Lovenox back to Xarelto.  Clayburn Pert, PharmD, BCPS Pager: [phone] 03/17/2016  12:06 PM

## 2016-03-18 DIAGNOSIS — D638 Anemia in other chronic diseases classified elsewhere: Secondary | ICD-10-CM

## 2016-03-18 DIAGNOSIS — N200 Calculus of kidney: Secondary | ICD-10-CM

## 2016-03-18 DIAGNOSIS — I82402 Acute embolism and thrombosis of unspecified deep veins of left lower extremity: Secondary | ICD-10-CM

## 2016-03-18 LAB — CBC
HEMATOCRIT: 24.2 % — AB (ref 36.0–46.0)
Hemoglobin: 7.9 g/dL — ABNORMAL LOW (ref 12.0–15.0)
MCH: 29.5 pg (ref 26.0–34.0)
MCHC: 32.6 g/dL (ref 30.0–36.0)
MCV: 90.3 fL (ref 78.0–100.0)
Platelets: 507 10*3/uL — ABNORMAL HIGH (ref 150–400)
RBC: 2.68 MIL/uL — AB (ref 3.87–5.11)
RDW: 16.8 % — ABNORMAL HIGH (ref 11.5–15.5)
WBC: 12.3 10*3/uL — AB (ref 4.0–10.5)

## 2016-03-18 LAB — BASIC METABOLIC PANEL
ANION GAP: 4 — AB (ref 5–15)
BUN: 5 mg/dL — ABNORMAL LOW (ref 6–20)
CO2: 29 mmol/L (ref 22–32)
Calcium: 8.9 mg/dL (ref 8.9–10.3)
Chloride: 110 mmol/L (ref 101–111)
Creatinine, Ser: 0.42 mg/dL — ABNORMAL LOW (ref 0.44–1.00)
GLUCOSE: 90 mg/dL (ref 65–99)
POTASSIUM: 3.1 mmol/L — AB (ref 3.5–5.1)
Sodium: 143 mmol/L (ref 135–145)

## 2016-03-18 LAB — GLUCOSE, CAPILLARY
GLUCOSE-CAPILLARY: 95 mg/dL (ref 65–99)
GLUCOSE-CAPILLARY: 101 mg/dL — AB (ref 65–99)
Glucose-Capillary: 146 mg/dL — ABNORMAL HIGH (ref 65–99)
Glucose-Capillary: 153 mg/dL — ABNORMAL HIGH (ref 65–99)

## 2016-03-18 MED ORDER — FUROSEMIDE 10 MG/ML IJ SOLN
40.0000 mg | Freq: Once | INTRAMUSCULAR | Status: AC
  Administered 2016-03-18: 40 mg via INTRAVENOUS
  Filled 2016-03-18: qty 4

## 2016-03-18 MED ORDER — POTASSIUM CHLORIDE CRYS ER 20 MEQ PO TBCR
40.0000 meq | EXTENDED_RELEASE_TABLET | Freq: Two times a day (BID) | ORAL | Status: AC
  Administered 2016-03-18 (×2): 40 meq via ORAL
  Filled 2016-03-18 (×2): qty 2

## 2016-03-18 MED ORDER — SODIUM CHLORIDE 0.9% FLUSH
10.0000 mL | INTRAVENOUS | Status: DC | PRN
  Administered 2016-03-20: 10 mL
  Filled 2016-03-18: qty 40

## 2016-03-18 MED ORDER — SODIUM CHLORIDE 0.9 % IV SOLN
INTRAVENOUS | Status: DC
  Administered 2016-03-18: 12:00:00 via INTRAVENOUS

## 2016-03-18 MED ORDER — INSULIN ASPART 100 UNIT/ML ~~LOC~~ SOLN
0.0000 [IU] | Freq: Three times a day (TID) | SUBCUTANEOUS | Status: DC
  Administered 2016-03-19: 2 [IU] via SUBCUTANEOUS
  Administered 2016-03-20: 1 [IU] via SUBCUTANEOUS

## 2016-03-18 MED ORDER — RIVAROXABAN 20 MG PO TABS
20.0000 mg | ORAL_TABLET | Freq: Every day | ORAL | Status: DC
  Administered 2016-03-18 – 2016-03-19 (×2): 20 mg via ORAL
  Filled 2016-03-18 (×2): qty 1

## 2016-03-18 MED ORDER — SODIUM CHLORIDE 0.9% FLUSH
10.0000 mL | Freq: Two times a day (BID) | INTRAVENOUS | Status: DC
  Administered 2016-03-18: 10 mL

## 2016-03-18 NOTE — Progress Notes (Signed)
Peripherally Inserted Central Catheter/Midline Placement  The IV Nurse has discussed with the patient and/or persons authorized to consent for the patient, the purpose of this procedure and the potential benefits and risks involved with this procedure.  The benefits include less needle sticks, lab draws from the catheter, and the patient may be discharged home with the catheter. Risks include, but not limited to, infection, bleeding, blood clot (thrombus formation), and puncture of an artery; nerve damage and irregular heartbeat and possibility to perform a PICC exchange if needed/ordered by physician.  Alternatives to this procedure were also discussed.  Bard Power PICC patient education guide, fact sheet on infection prevention and patient information card has been provided to patient /or left at bedside.    PICC/Midline Placement Documentation  PICC Single Lumen 03/18/16 PICC Right 40 cm 1 cm (Active)  Indication for Insertion or Continuance of Line Home intravenous therapies (PICC only) 03/18/2016 10:24 AM  Exposed Catheter (cm) 1 cm 03/18/2016 10:24 AM  Site Assessment Clean;Dry;Intact 03/18/2016 10:24 AM  Line Status Flushed;Saline locked;Blood return noted 03/18/2016 10:24 AM  Dressing Type Transparent 03/18/2016 10:24 AM  Dressing Status Clean;Dry;Intact 03/18/2016 10:24 AM  Dressing Change Due 03/25/16 03/18/2016 10:24 AM       [facility] Payment 03/18/2016, 10:25 AM

## 2016-03-18 NOTE — Progress Notes (Signed)
[facility] PROGRESS NOTE  [name] [id number] DOB: December 01, 1960 DOA: 03/08/2016  PCP: No primary care provider on file.  Brief History/Interval Summary: 55 year old African-American female who resides at a skilled nursing facility and was a past medical history of multiple sclerosis was admitted on 9/26 with altered mental status and septic shock. It was felt that her presentation was due to pelvic abscess versus UTI versus decubitus ulcers. General surgery was consulted. CT scan was repeated, which suggested that the abscess abuts the urinary bladder. So urology was consulted as well.   Reason for Visit: Pelvic abscess  Consultants: Gen. surgery. Urology. Infectious disease  Procedures:  Left internal jugular triple-lumen catheter 9/26 Cystoscopy, multiple bladder biopsies. 10/4  Antibiotics: Vancomycin and Zosyn. 9/26  Subjective/Interval History: Patient starting to feel slightly better. Her cough is improved. Her pain in the lower abdomen is improving. She denies any nausea or vomiting. No diarrhea.  ROS: Denies any chest pain or shortness of breath.  Objective:  Vital Signs  Vitals:   03/17/16 0558 03/17/16 2123 03/18/16 0523 03/18/16 0814  BP: 126/74 (!) 127/55 116/62   Pulse: 68 84 74   Resp: 14 16 16    Temp: 98.2 F (36.8 C) 98.2 F (36.8 C) 98.3 F (36.8 C)   TempSrc: Oral Oral Oral   SpO2: 100% 97% 100%   Weight: 83 kg (183 lb)   73.5 kg (162 lb)  Height:        Intake/Output Summary (Last 24 hours) at 03/18/16 0831 Last data filed at 03/18/16 0600  Gross per 24 hour  Intake           1462.5 ml  Output             4850 ml  Net          -3387.5 ml   Filed Weights   03/16/16 1000 03/17/16 0558 03/18/16 0814  Weight: 80.3 kg (177 lb) 83 kg (183 lb) 73.5 kg (162 lb)    General appearance: alert, cooperative, appears stated age and no distress Resp: Improved air entry bilaterally. Less wheezing today compared to yesterday. Cardio: regular  rate and rhythm, S1, S2 normal, no murmur, click, rub or gallop GI: Lower abdominal tenderness has improved. Bowel sounds are present and normal. No masses or organomegaly. Extremities: Hyperpigmentation noted in bilateral lower extremities, which are chronic Skin: Hyperpigmentation bilateral lower extremity Neurologic: Unable to move her lower extremities due to her history of MS  Lab Results:  Data Reviewed: I have personally reviewed following labs and imaging studies  CBC:  Recent Labs Lab 03/12/16 0445 03/13/16 0345 03/14/16 0400 03/17/16 0425 03/18/16 0425  WBC 13.2* 12.1* 11.2* 13.4* 12.3*  HGB 7.2* 7.2* 8.2* 7.8* 7.9*  HCT 21.3* 21.4* 24.6* 23.4* 24.2*  MCV 85.5 87.7 88.2 89.3 90.3  PLT 280 302 339 443* 507*    Basic Metabolic Panel:  Recent Labs Lab 03/12/16 1020  03/13/16 1500  03/14/16 2135 03/15/16 0335 03/16/16 0416 03/17/16 0425 03/18/16 0425  NA  --   < >  --   < > 142 142 144 142 143  K  --   < >  --   < > 3.3* 3.2* 3.0* 3.6 3.1*  CL  --   < >  --   < > 116* 117* 116* 114* 110  CO2  --   < >  --   < > 24 24 25 26 29   GLUCOSE  --   < >  --   < >  135* 112* 109* 96 90  BUN  --   < >  --   < > 6 5* 6 5* 5*  CREATININE  --   < >  --   < > 0.42* 0.40* 0.47 0.37* 0.42*  CALCIUM  --   < >  --   < > 8.6* 8.8* 8.8* 8.6* 8.9  MG 1.4*  --  1.9  --   --   --  1.5*  --   --   < > = values in this interval not displayed.  GFR: Estimated Creatinine Clearance: 77.9 mL/min (by C-G formula based on SCr of 0.42 mg/dL (L)).   CBG:  Recent Labs Lab 03/17/16 1631 03/17/16 2000 03/18/16 0007 03/18/16 0412 03/18/16 0749  GLUCAP 133* 169* 146* 95 101*     Recent Results (from the past 240 hour(s))  Blood Culture (routine x 2)     Status: None   Collection Time: 03/08/16 12:40 PM  Result Value Ref Range Status   Specimen Description BLOOD RIGHT ARM  Final   Special Requests BOTTLES DRAWN AEROBIC AND ANAEROBIC 5CC  Final   Culture   Final    NO GROWTH 5  DAYS Performed at [facility]    Report Status 03/13/2016 FINAL  Final  Urine culture     Status: Abnormal   Collection Time: 03/08/16 12:40 PM  Result Value Ref Range Status   Specimen Description URINE, CLEAN CATCH  Final   Special Requests NONE  Final   Culture MULTIPLE SPECIES PRESENT, SUGGEST RECOLLECTION (A)  Final   Report Status 03/10/2016 FINAL  Final  Blood Culture (routine x 2)     Status: None   Collection Time: 03/08/16  2:12 PM  Result Value Ref Range Status   Specimen Description BLOOD RIGHT ANTECUBITAL  Final   Special Requests BOTTLES DRAWN AEROBIC ONLY 2ML  Final   Culture   Final    NO GROWTH 5 DAYS Performed at [facility]    Report Status 03/13/2016 FINAL  Final  MRSA PCR Screening     Status: None   Collection Time: 03/08/16  6:16 PM  Result Value Ref Range Status   MRSA by PCR NEGATIVE NEGATIVE Final    Comment:        The GeneXpert MRSA Assay (FDA approved for NASAL specimens only), is one component of a comprehensive MRSA colonization surveillance program. It is not intended to diagnose MRSA infection nor to guide or monitor treatment for MRSA infections.       Radiology Studies: Dg Chest Port 1 View  Result Date: 03/17/2016 CLINICAL DATA:  Cough. EXAM: PORTABLE CHEST 1 VIEW COMPARISON:  Radiograph of March 10, 2016. FINDINGS: Stable cardiomediastinal silhouette. Left internal jugular catheter is unchanged with distal tip in expected position of the SVC. No pneumothorax is noted. Increased bilateral perihilar and basilar interstitial densities are noted concerning for edema. Stable mild left pleural effusion is noted. Bony thorax is unremarkable. IMPRESSION: Bilateral perihilar and basilar edema is noted. Stable mild left pleural effusion. Electronically Signed   By: Marijo Conception, M.D.   On: 03/17/2016 09:43     Medications:  Scheduled: . atorvastatin  10 mg Oral q1800  . baclofen  10 mg Oral TID  . ceFEPime (MAXIPIME)  IV  2 g Intravenous Q12H  . chlorhexidine  15 mL Mouth Rinse BID  . enoxaparin (LOVENOX) injection  80 mg Subcutaneous Q12H  . famotidine  20 mg Oral Daily  .  feeding supplement  1 Container Oral BID BM  . furosemide  40 mg Intravenous Once  . insulin aspart  0-9 Units Subcutaneous Q4H  . mouth rinse  15 mL Mouth Rinse q12n4p  . potassium chloride  40 mEq Oral BID   Continuous:   [id number], albuterol, alum & mag hydroxide-simeth, sodium chloride flush  Assessment/Plan:  Active Problems:   DVT, lower extremity (HCC)   Anemia of chronic disease   Chronic indwelling Foley catheter   Physical deconditioning   Pressure ulcer   Left nephrolithiasis   Septic shock (HCC)   Pressure injury of skin   Pelvic abscess in female    Pelvic abscess Patient was placed on broad-spectrum antibiotics with vancomycin and Zosyn. She initially presented with septic shock requiring pressors. She is currently hemodynamically stable. She had a repeat CT scan on 10/3 which showed pelvic collection again, but also showed findings of Urinary bladder injury/rupture. Urology was consulted. Patient underwent cystoscopy on 10/4 and multiple biopsies were taken from a lesion identified in the urinary bladder. Pathology Reveals inflammatory lesion without any evidence for malignancy. Cultures have all been negative. The only positive culture that we have is from August when the urine culture revealed Pseudomonas. In view of this, infectious disease was consulted. Appreciate Dr. Algis Downs input. Patient has been placed on cefepime and will need to take this for 2 weeks. PICC line has been ordered.   Cough and wheezing/pulmonary edema Chest x-ray revealed evidence for fluid overload. IV fluids were stopped. Patient was given intravenous Lasix. She has diuresed well. Her lungs show improved air entry. She will be given an additional dose of Lasix today. Chest x-ray will be repeated tomorrow morning.  Echocardiogram from June showed normal systolic function. Fluid overload could be due to aggressive fluid resuscitation provided when she was in septic shock.  Septic shock. Resolved. Secondary to the pelvic abscesses. She initially required vasopressors.  Acute kidney injury on chronic kidney disease stage III. Renal failure has resolved. She now has a normal renal function. Continue to monitor.  Hypokalemia and hypomagnesemia. Continue to replace potassium, while she is getting diuresed.  Leukocytosis. Secondary to her acute infection. Improving.  History of essential hypertension. Continue to monitor blood pressures closely. Seems to be reasonably well controlled.  History of diabetes mellitus type 2 Continue SSI. Holding her oral agents.  Normocytic anemia. Hemoglobin is low but stable. No evidence for any overt bleeding. Likely due to chronic disease.   Nephrolithiasis with left nephrostomy tube Urology will arrange outpatient follow-up for same.  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 She was on Xarelto prior to admission. It appears that she had been on this for many months. A nonocclusive acute DVT was diagnosed in September 2016. It appears that the Xarelto was continued due to her bedbound status. Currently Xarelto is on hold. Currently on Lovenox. Discussed with urology and no further procedures are planned. Okay to resume Xarelto.  DVT Prophylaxis: Lovenox Code Status: Full code  Family Communication: Discussed with the patient. No family at bedside  Disposition Plan: Management as outlined above. Additional dose of Lasix today. PICC line to be placed today. Anticipate discharge tomorrow back to SNF.    LOS: 10 days   [place] Pager [phone] 03/18/2016, 8:31 AM  If 7PM-7AM, please contact night-coverage at [email or web address], password [facility]

## 2016-03-18 NOTE — Progress Notes (Signed)
[place] for Xarelto Indication: hx VTE   No Known Allergies  Patient Measurements: Height: 5\' 5"  (165.1 cm) Weight: 162 lb (73.5 kg) IBW/kg (Calculated) : 57   Vital Signs: Temp: 98.3 F (36.8 C) (10/06 0523) Temp Source: Oral (10/06 0523) BP: 116/62 (10/06 0523) Pulse Rate: 74 (10/06 0523)  Labs:  Recent Labs  03/16/16 0416 03/17/16 0425 03/18/16 0425  HGB  --  7.8* 7.9*  HCT  --  23.4* 24.2*  PLT  --  443* 507*  CREATININE 0.47 0.37* 0.42*    Estimated Creatinine Clearance: 77.9 mL/min (by C-G formula based on SCr of 0.42 mg/dL (L)).   Medications:   PTA Xarelto 20mg  daily - LD 9/25 @ 1700 Infusions:    Assessment: 55 yo F on chronic Xarelto for hx VTE, held due to acute illness/ renal failure (est CrCl <65ml/min). Last xarelto dose taken on 03/07/16 at 1700.  Pharmacy was initially consulted to dose Heparin, then Lovenox.  Today, 03/18/2016:  On Lovenox 80 mg SQ q12h, last dose today at [place]  Attending MD discussed with urology - no further invasive procedures are planned at present.  Orders received to transition from Lovenox back to full-dose Xarelto.  Azotemia resolved.  No contraindications to resuming Xarelto.    Plan:  1.  DC Lovenox 2.  Resume Xarelto 20 mg daily with evening meal starting tonight at 6 pm. 3.  Plans for noted for discharge back to SNF tomorrow.  Clayburn Pert, PharmD, BCPS Pager: [phone] 03/18/2016  11:26 AM

## 2016-03-18 NOTE — Progress Notes (Signed)
CSW following to assist with d/c planning. Met with pt this am to offer support. Pt reports she may be ready for d/c back to [facility] this weekend. CSW has left vm for Admissions at SNF reporting that pt will have PICC line and need IV antibiotics with possible d/c this weekend. CSW will continue to follow to assist with d/c planning to SNF.  Werner Lean LCSW [phone]

## 2016-03-18 NOTE — Progress Notes (Signed)
2 Days Post-Op Subjective: Patient reports no significant abdominal pain.  She has been stable.  Objective: Vital signs in last 24 hours: Temp:  [98.2 F (36.8 C)-98.3 F (36.8 C)] 98.3 F (36.8 C) (10/06 0523) Pulse Rate:  [74-84] 74 (10/06 0523) Resp:  [16] 16 (10/06 0523) BP: (116-127)/(55-62) 116/62 (10/06 0523) SpO2:  [97 %-100 %] 100 % (10/06 0523) Weight:  [73.5 kg (162 lb)] 73.5 kg (162 lb) (10/06 0814)  Intake/Output from previous day: 10/05 0701 - 10/06 0700 In: 1462.5 [P.O.:360; I.V.:792.5; IV Piggyback:150] Out: [id number] [Urine:4850] Intake/Output this shift: Total I/O In: 280 [P.O.:240; I.V.:40] Out: 225 [Urine:225]  Physical Exam:  Constitutional: Vital signs reviewed. WD WN in NAD   Eyes: PERRL, No scleral icterus.   Cardiovascular: Sinus rhythm Pulmonary/Chest: Normal effort   Lab Results:  Recent Labs  03/17/16 0425 03/18/16 0425  HGB 7.8* 7.9*  HCT 23.4* 24.2*   BMET  Recent Labs  03/17/16 0425 03/18/16 0425  NA 142 143  K 3.6 3.1*  CL 114* 110  CO2 26 29  GLUCOSE 96 90  BUN 5* 5*  CREATININE 0.37* 0.42*  CALCIUM 8.6* 8.9   No results for input(s): LABPT, INR in the last 72 hours. No results for input(s): LABURIN in the last 72 hours. Results for orders placed or performed during the hospital encounter of 03/08/16  Blood Culture (routine x 2)     Status: None   Collection Time: 03/08/16 12:40 PM  Result Value Ref Range Status   Specimen Description BLOOD RIGHT ARM  Final   Special Requests BOTTLES DRAWN AEROBIC AND ANAEROBIC 5CC  Final   Culture   Final    NO GROWTH 5 DAYS Performed at [facility]    Report Status 03/13/2016 FINAL  Final  Urine culture     Status: Abnormal   Collection Time: 03/08/16 12:40 PM  Result Value Ref Range Status   Specimen Description URINE, CLEAN CATCH  Final   Special Requests NONE  Final   Culture MULTIPLE SPECIES PRESENT, SUGGEST RECOLLECTION (A)  Final   Report Status 03/10/2016 FINAL   Final  Blood Culture (routine x 2)     Status: None   Collection Time: 03/08/16  2:12 PM  Result Value Ref Range Status   Specimen Description BLOOD RIGHT ANTECUBITAL  Final   Special Requests BOTTLES DRAWN AEROBIC ONLY 2ML  Final   Culture   Final    NO GROWTH 5 DAYS Performed at [facility]    Report Status 03/13/2016 FINAL  Final  MRSA PCR Screening     Status: None   Collection Time: 03/08/16  6:16 PM  Result Value Ref Range Status   MRSA by PCR NEGATIVE NEGATIVE Final    Comment:        The GeneXpert MRSA Assay (FDA approved for NASAL specimens only), is one component of a comprehensive MRSA colonization surveillance program. It is not intended to diagnose MRSA infection nor to guide or monitor treatment for MRSA infections.     Studies/Results: Dg Chest Port 1 View  Result Date: 03/17/2016 CLINICAL DATA:  Cough. EXAM: PORTABLE CHEST 1 VIEW COMPARISON:  Radiograph of March 10, 2016. FINDINGS: Stable cardiomediastinal silhouette. Left internal jugular catheter is unchanged with distal tip in expected position of the SVC. No pneumothorax is noted. Increased bilateral perihilar and basilar interstitial densities are noted concerning for edema. Stable mild left pleural effusion is noted. Bony thorax is unremarkable. IMPRESSION: Bilateral perihilar and basilar edema is  noted. Stable mild left pleural effusion. Electronically Signed   By: Marijo Conception, M.D.   On: 03/17/2016 09:43   Pathology from bladder biopsy is back-benign inflammatory changes. Assessment/Plan:   Pelvic abscess/phlegmon.  She does have bladder wall involvement.  I'm still strongly suspicious that this may be a primary process outside of the bladder involving the bladder.  For now, leave nephrostomy tube as well as Foley catheter in.  I will see about the usefulness of contrast and CT-rectal contrast as well as oral-in order to rule out any significant GI pathology that is involving this  process.   LOS: 10 days   Jorja Loa 03/18/2016, 12:41 PM

## 2016-03-19 ENCOUNTER — Encounter (HOSPITAL_COMMUNITY): Payer: Self-pay | Admitting: Radiology

## 2016-03-19 ENCOUNTER — Inpatient Hospital Stay (HOSPITAL_COMMUNITY): Payer: Medicare Other

## 2016-03-19 LAB — CBC
HEMATOCRIT: 25.2 % — AB (ref 36.0–46.0)
HEMOGLOBIN: 8 g/dL — AB (ref 12.0–15.0)
MCH: 28.8 pg (ref 26.0–34.0)
MCHC: 31.7 g/dL (ref 30.0–36.0)
MCV: 90.6 fL (ref 78.0–100.0)
Platelets: 526 10*3/uL — ABNORMAL HIGH (ref 150–400)
RBC: 2.78 MIL/uL — ABNORMAL LOW (ref 3.87–5.11)
RDW: 16.8 % — ABNORMAL HIGH (ref 11.5–15.5)
WBC: 12.4 10*3/uL — AB (ref 4.0–10.5)

## 2016-03-19 LAB — BASIC METABOLIC PANEL
ANION GAP: 3 — AB (ref 5–15)
BUN: 8 mg/dL (ref 6–20)
CHLORIDE: 107 mmol/L (ref 101–111)
CO2: 31 mmol/L (ref 22–32)
Calcium: 9.3 mg/dL (ref 8.9–10.3)
Creatinine, Ser: 0.4 mg/dL — ABNORMAL LOW (ref 0.44–1.00)
GFR calc Af Amer: >60 mL/min (ref >60)
Glucose, Bld: 101 mg/dL — ABNORMAL HIGH (ref 65–99)
POTASSIUM: 3.5 mmol/L (ref 3.5–5.1)
SODIUM: 141 mmol/L (ref 135–145)

## 2016-03-19 LAB — GLUCOSE, CAPILLARY
GLUCOSE-CAPILLARY: 198 mg/dL — AB (ref 65–99)
Glucose-Capillary: 114 mg/dL — ABNORMAL HIGH (ref 65–99)
Glucose-Capillary: 150 mg/dL — ABNORMAL HIGH (ref 65–99)

## 2016-03-19 MED ORDER — FUROSEMIDE 10 MG/ML IJ SOLN
40.0000 mg | Freq: Once | INTRAMUSCULAR | Status: AC
  Administered 2016-03-19: 40 mg via INTRAVENOUS
  Filled 2016-03-19: qty 4

## 2016-03-19 MED ORDER — IOPAMIDOL (ISOVUE-300) INJECTION 61%
30.0000 mL | Freq: Once | INTRAVENOUS | Status: AC | PRN
  Administered 2016-03-19: 30 mL via ORAL

## 2016-03-19 MED ORDER — POTASSIUM CHLORIDE CRYS ER 10 MEQ PO TBCR
10.0000 meq | EXTENDED_RELEASE_TABLET | Freq: Every day | ORAL | Status: DC
  Administered 2016-03-19 – 2016-03-20 (×2): 10 meq via ORAL
  Filled 2016-03-19: qty 1

## 2016-03-19 MED ORDER — FUROSEMIDE 20 MG PO TABS
20.0000 mg | ORAL_TABLET | Freq: Every day | ORAL | Status: DC
  Administered 2016-03-20: 20 mg via ORAL
  Filled 2016-03-19: qty 1

## 2016-03-19 NOTE — Progress Notes (Addendum)
[facility] PROGRESS NOTE  MARCE SCURRY [id number] DOB: 24-Nov-1960 DOA: 03/08/2016  PCP: No primary care provider on file.  Brief History/Interval Summary: 55 year old African-American female who resides at a skilled nursing facility and was a past medical history of multiple sclerosis was admitted on 9/26 with altered mental status and septic shock. It was felt that her presentation was due to pelvic abscess versus UTI versus decubitus ulcers. General surgery was consulted. CT scan was repeated, which suggested that the abscess abuts the urinary bladder. So urology was consulted as well. Patient underwent cystoscopy and biopsies of the lesion identified in the urinary bladder.  Reason for Visit: Pelvic abscess  Consultants: Gen. surgery. Urology. Infectious disease  Procedures:  Left internal jugular triple-lumen catheter 9/26 Cystoscopy, multiple bladder biopsies. 10/4  Antibiotics: Vancomycin and Zosyn. 9/26--10/5 Cefepime 10/5  Subjective/Interval History: Patient feels well. Complains of some pain in her right arm where the PICC line was placed yesterday.   ROS: Denies any chest pain or shortness of breath.  Objective:  Vital Signs  Vitals:   03/18/16 1434 03/18/16 2100 03/19/16 0500 03/19/16 0527  BP: (!) 146/66 120/75  125/62  Pulse: 94 79  71  Resp: 16 18  16   Temp: 98.2 F (36.8 C) 98.4 F (36.9 C)  98.6 F (37 C)  TempSrc: Oral Oral  Oral  SpO2: 100% 100%  100%  Weight:   73.5 kg (162 lb 0.6 oz)   Height:        Intake/Output Summary (Last 24 hours) at 03/19/16 0846 Last data filed at 03/19/16 0600  Gross per 24 hour  Intake            806.5 ml  Output             3575 ml  Net          -2768.5 ml   Filed Weights   03/17/16 0558 03/18/16 0814 03/19/16 0500  Weight: 83 kg (183 lb) 73.5 kg (162 lb) 73.5 kg (162 lb 0.6 oz)    General appearance: alert, cooperative, appears stated age and no distress Resp: Much improved air entry  bilaterally. No wheezing heard today Cardio: regular rate and rhythm, S1, S2 normal, no murmur, click, rub or gallop GI: Lower abdominal tenderness has improved though still present. Bowel sounds are present and normal. No masses or organomegaly. Extremities: Hyperpigmentation noted in bilateral lower extremities, which are chronic Skin: Hyperpigmentation bilateral lower extremity Neurologic: Unable to move her lower extremities due to her history of MS  Lab Results:  Data Reviewed: I have personally reviewed following labs and imaging studies  CBC:  Recent Labs Lab 03/13/16 0345 03/14/16 0400 03/17/16 0425 03/18/16 0425 03/19/16 0450  WBC 12.1* 11.2* 13.4* 12.3* 12.4*  HGB 7.2* 8.2* 7.8* 7.9* 8.0*  HCT 21.4* 24.6* 23.4* 24.2* 25.2*  MCV 87.7 88.2 89.3 90.3 90.6  PLT 302 339 443* 507* 526*    Basic Metabolic Panel:  Recent Labs Lab 03/12/16 1020  03/13/16 1500  03/15/16 0335 03/16/16 0416 03/17/16 0425 03/18/16 0425 03/19/16 0450  NA  --   < >  --   < > 142 144 142 143 141  K  --   < >  --   < > 3.2* 3.0* 3.6 3.1* 3.5  CL  --   < >  --   < > 117* 116* 114* 110 107  CO2  --   < >  --   < > 24 25 26  29 31  GLUCOSE  --   < >  --   < > 112* 109* 96 90 101*  BUN  --   < >  --   < > 5* 6 5* 5* 8  CREATININE  --   < >  --   < > 0.40* 0.47 0.37* 0.42* 0.40*  CALCIUM  --   < >  --   < > 8.8* 8.8* 8.6* 8.9 9.3  MG 1.4*  --  1.9  --   --  1.5*  --   --   --   < > = values in this interval not displayed.  GFR: Estimated Creatinine Clearance: 77.9 mL/min (by C-G formula based on SCr of 0.4 mg/dL (L)).   CBG:  Recent Labs Lab 03/17/16 2000 03/18/16 0007 03/18/16 0412 03/18/16 0749 03/18/16 1226  GLUCAP 169* 146* 95 101* 153*     No results found for this or any previous visit (from the past 240 hour(s)).    Radiology Studies: Dg Chest Port 1 View  Result Date: 03/19/2016 CLINICAL DATA:  Pulmonary edema.  Hypertension. EXAM: PORTABLE CHEST 1 VIEW COMPARISON:   March 17, 2016 FINDINGS: New right central there has been resolution of edema compared to recent study. There are small pleural effusions bilaterally. There remains cardiomegaly. The pulmonary vascularity is normal. No adenopathy. No bone lesions. Catheter tip is in the right atrium with the tip approximately 4 cm beyond the cavoatrial junction. No pneumothorax. IMPRESSION: Central catheter tip in right atrium. No pneumothorax. Small pleural effusions and cardiomegaly. Edema has resolved. No new opacity. Cardiac silhouette is stable. Electronically Signed   By: Lowella Grip III M.D.   On: 03/19/2016 07:12   Dg Chest Port 1 View  Result Date: 03/17/2016 CLINICAL DATA:  Cough. EXAM: PORTABLE CHEST 1 VIEW COMPARISON:  Radiograph of March 10, 2016. FINDINGS: Stable cardiomediastinal silhouette. Left internal jugular catheter is unchanged with distal tip in expected position of the SVC. No pneumothorax is noted. Increased bilateral perihilar and basilar interstitial densities are noted concerning for edema. Stable mild left pleural effusion is noted. Bony thorax is unremarkable. IMPRESSION: Bilateral perihilar and basilar edema is noted. Stable mild left pleural effusion. Electronically Signed   By: Marijo Conception, M.D.   On: 03/17/2016 09:43     Medications:  Scheduled: . atorvastatin  10 mg Oral q1800  . baclofen  10 mg Oral TID  . ceFEPime (MAXIPIME) IV  2 g Intravenous Q12H  . chlorhexidine  15 mL Mouth Rinse BID  . famotidine  20 mg Oral Daily  . feeding supplement  1 Container Oral BID BM  . insulin aspart  0-9 Units Subcutaneous TID WC  . mouth rinse  15 mL Mouth Rinse q12n4p  . rivaroxaban  20 mg Oral Q supper  . sodium chloride flush  10-40 mL Intracatheter Q12H   Continuous: . sodium chloride 10 mL/hr at 03/18/16 1221   [id number], albuterol, alum & mag hydroxide-simeth, sodium chloride flush, sodium chloride flush  Assessment/Plan:  Active Problems:   DVT, lower  extremity (HCC)   Anemia of chronic disease   Chronic indwelling Foley catheter   Physical deconditioning   Pressure ulcer   Left nephrolithiasis   Septic shock (HCC)   Pressure injury of skin   Pelvic abscess in female    Pelvic abscess Patient was placed on broad-spectrum antibiotics with vancomycin and Zosyn. She initially presented with septic shock requiring pressors. She is currently hemodynamically stable. She had  a repeat CT scan on 10/3 which showed pelvic collection again, but also showed findings of Urinary bladder injury/rupture. Urology was consulted. Patient underwent cystoscopy on 10/4 and multiple biopsies were taken from a lesion identified in the urinary bladder. Pathology Reveals inflammatory lesion without any evidence for malignancy. Cultures have all been negative. The only positive culture that we have is from August when the urine culture revealed Pseudomonas. In view of this, infectious disease was consulted. Appreciate Dr. Algis Downs input. Patient has been placed on cefepime and will need to take this for 2 weeks. PICC line has been placed. Urology to repeat a CT scan to look at the fluid collection.  Cough and wheezing/pulmonary edema Chest x-ray revealed evidence for fluid overload. IV fluids were stopped. Patient was given intravenous Lasix. She has diuresed well. Her lungs show improved air entry. Chest x-ray shows resolution of pulmonary edema. Hold off on further doses of diuretics. Echocardiogram from June showed normal systolic function. Fluid overload could be due to aggressive fluid resuscitation provided when she was in septic shock.  Septic shock. Resolved. Secondary to the pelvic abscesses. She initially required vasopressors.  Acute kidney injury on chronic kidney disease stage III. Renal failure has resolved. She now has a normal renal function. Continue to monitor.  Hypokalemia and hypomagnesemia. Normal  Leukocytosis. Secondary to her acute  infection. Improving.  History of essential hypertension. Continue to monitor blood pressures closely. Seems to be reasonably well controlled.  History of diabetes mellitus type 2 Continue SSI. Holding her oral agents.  Normocytic anemia. Hemoglobin is low but stable. No evidence for any overt bleeding. Likely due to chronic disease.   Nephrolithiasis with left nephrostomy tube Urology will arrange outpatient follow-up for same.  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 She was on Xarelto prior to admission. It appears that she had been on this for many months. A nonocclusive acute DVT was diagnosed in September 2016. It appears that the Xarelto was continued due to her bedbound status. Xarelto was resumed 10/6, as at that time there was no plan for any further intervention per urology.  ADDENDUM CT scan report reviewed. There is some concern for cecal inflammation, and is thought to be related to the inflammation noted around the urinary bladder. No reports of colonoscopy available in our records. We will get an opinion from gastroenterology. Unfortunately, patient got a dose of Xarelto this evening. We'll see what gastroenterology stays tomorrow and consider holding her anticoagulation in case a procedure is thought to be necessary. Discussed with Dr. Amedeo Plenty and he will consult on her tomorrow.  DVT Prophylaxis: Lovenox Code Status: Full code  Family Communication: Discussed with the patient. No family at bedside  Disposition Plan: Urology has ordered repeat CT scan, which is to be done this morning. If it doesn't show any concerning findings, she may be able to go back to her skilled nursing facility later today or tomorrow.    LOS: 11 days   [facility] Pager [phone] 03/19/2016, 8:46 AM  If 7PM-7AM, please contact night-coverage at [email or web address], password [facility]

## 2016-03-20 MED ORDER — DEXTROSE 5 % IV SOLN: 2.0000 g | Freq: Two times a day (BID) | INTRAVENOUS | Status: DC

## 2016-03-20 MED ORDER — HEPARIN SOD (PORK) LOCK FLUSH 100 UNIT/ML IV SOLN
250.0000 [IU] | INTRAVENOUS | Status: AC | PRN
Start: 2016-03-20 — End: 2016-03-20
  Administered 2016-03-20: 250 [IU]

## 2016-03-20 MED ORDER — ACETAMINOPHEN 325 MG PO TABS: 650.0000 mg | ORAL_TABLET | Freq: Four times a day (QID) | ORAL | Status: AC | PRN

## 2016-03-20 MED ORDER — POTASSIUM CHLORIDE CRYS ER 10 MEQ PO TBCR: 10.0000 meq | EXTENDED_RELEASE_TABLET | Freq: Every day | ORAL | Status: DC

## 2016-03-20 NOTE — Progress Notes (Signed)
Pt for discharge to [facility].   CSW facilitated pt discharge needs including contacting facility, sending pt d/c info via [employer], discussing with pt at bedside, LM for pt cousin, [name] at pt request, providing RN phone number to call report, and arranged [facility] transport for pt back to [facility].  No further social work needs identified at this time.  CSW signing off.   Alison Murray, MSW, LCSW Clinical Social Worker Weekend Coverage [phone]

## 2016-03-20 NOTE — Discharge Summary (Addendum)
[facility]  Physician Discharge Summary   Patient ID: [name] MRN: [id number] DOB/AGE: 1960/07/29 55 y.o.  Admit date: 03/08/2016 Discharge date: 03/20/2016  PCP: No primary care provider on file.  DISCHARGE DIAGNOSES:  Active Problems:   DVT, lower extremity (HCC)   Anemia of chronic disease   Chronic indwelling Foley catheter   Physical deconditioning   Pressure ulcer   Left nephrolithiasis   Septic shock (HCC)   Pressure injury of skin   Pelvic abscess in female   RECOMMENDATIONS FOR OUTPATIENT FOLLOW UP: CBC and basic metabolic panel in 4-5 days.  Needs to be given cefepime for 2 weeks as mentioned on the medication list.  Will need to follow-up with her urologist, Dr. Dorina Hoyer for further management of the nephrostomy tube.  Will need to leave the Foley catheter in place Monitor CBGs at the skilled nursing facility. May benefit from repeat CT scan of her abdomen, pelvis prior to completion of her antibiotic treatment.   DISCHARGE CONDITION: fair  Diet recommendation: As before  Filed Weights   03/18/16 0814 03/19/16 0500 03/20/16 0600  Weight: 73.5 kg (162 lb) 73.5 kg (162 lb 0.6 oz) 73.9 kg (163 lb)    INITIAL HISTORY: 55 year old African-American female who resides at a skilled nursing facility and was a past medical history of multiple sclerosis was admitted on 9/26 with altered mental status and septic shock. It was felt that her presentation was due to pelvic abscess versus UTI versus decubitus ulcers. General surgery was consulted. CT scan was repeated, which suggested that the abscess abuts the urinary bladder. So urology was consulted as well. Patient underwent cystoscopy and biopsies of the lesion identified in the urinary bladder.  Consultants: Gen. surgery. Urology. Infectious disease. Gastroenterology  Procedures:  Left internal jugular triple-lumen catheter 9/26 Cystoscopy, multiple bladder biopsies. 10/4   HOSPITAL COURSE:    Pelvic abscess Patient was placed on broad-spectrum antibiotics with vancomycin and Zosyn. She initially presented with septic shock requiring pressors. She is currently hemodynamically stable. She had a repeat CT scan on 10/3 which showed pelvic collection again, but also showed findings of Urinary bladder injury/rupture. Urology was consulted. Patient underwent cystoscopy on 10/4 and multiple biopsies were taken from a lesion identified in the urinary bladder. Pathology reveals inflammatory lesion without any evidence for malignancy. Cultures have all been negative. The only positive culture that we have is from August when the urine culture revealed Pseudomonas. In view of this, infectious disease was consulted. Patient has been placed on cefepime and will need to take this for 2 weeks. PICC line has been placed. Urology repeated a CT scan which showed similar findings as before, but there was some area of concern in the cecum. This prompted consultation by gastroenterology. Seen by Dr. Amedeo Plenty this morning. We appreciate his input. No further workup is planned. Colonoscopy will be extremely challenging this patient according to him. Plus she denies any GI symptoms at this time. So in view of this I have discussed again with the urology today. No further workup is planned at this time. Plan will be to continue the patient on her IV antibiotics as discussed earlier. She may return back to her skilled nursing facility.  Cough and wheezing/pulmonary edema Chest x-ray revealed evidence for fluid overload. IV fluids were stopped. Patient was given intravenous Lasix. She has diuresed well. Her lungs show improved air entry. Chest x-ray shows resolution of pulmonary edema. Hold off on further doses of diuretics. Echocardiogram from June showed normal  systolic function. Fluid overload could be due to aggressive fluid resuscitation provided when she was in septic shock. She may resume her home Lasix  dose.  Septic shock. Resolved. Secondary to the pelvic abscesses. She initially required vasopressors.  Acute kidney injury on chronic kidney disease stage III. Renal failure has resolved. She now has a normal renal function. Check labs at the skilled nursing facility in a few days.  Hypokalemia and hypomagnesemia. Normal  Leukocytosis. Secondary to her acute infection. Improving. Check labs at the skilled nursing facility in a few days.  History of essential hypertension. Continue to monitor blood pressures closely. Seems to be reasonably well controlled.  History of diabetes mellitus type 2 Resume home medications. Monitor CBGs.  Normocytic anemia. Hemoglobin is low but stable. No evidence for any overt bleeding. Likely due to chronic disease.   Nephrolithiasis with left nephrostomy tube Urology will arrange outpatient follow-up for same.  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 She was on Xarelto prior to admission. It appears that she had been on this for many months. A nonocclusive acute DVT was diagnosed in September 2016. Okay to continue with Xarelto.  Overall, stable. Improved. Will need long-term IV antibiotics. PICC line has been placed. Okay for discharge back to skilled nursing facility.   PERTINENT LABS:  The results of significant diagnostics from this hospitalization (including imaging, microbiology, ancillary and laboratory) are listed below for reference.      Labs: Basic Metabolic Panel:  Recent Labs Lab 03/13/16 1500  03/15/16 0335 03/16/16 0416 03/17/16 0425 03/18/16 0425 03/19/16 0450  NA  --   < > 142 144 142 143 141  K  --   < > 3.2* 3.0* 3.6 3.1* 3.5  CL  --   < > 117* 116* 114* 110 107  CO2  --   < > 24 25 26 29 31   GLUCOSE  --   < > 112* 109* 96 90 101*  BUN  --   < > 5* 6 5* 5* 8  CREATININE  --   < > 0.40* 0.47 0.37* 0.42* 0.40*  CALCIUM  --   < > 8.8* 8.8* 8.6* 8.9 9.3   MG 1.9  --   --  1.5*  --   --   --   < > = values in this interval not displayed.  CBC:  Recent Labs Lab 03/14/16 0400 03/17/16 0425 03/18/16 0425 03/19/16 0450  WBC 11.2* 13.4* 12.3* 12.4*  HGB 8.2* 7.8* 7.9* 8.0*  HCT 24.6* 23.4* 24.2* 25.2*  MCV 88.2 89.3 90.3 90.6  PLT 339 443* 507* 526*   CBG:  Recent Labs Lab 03/18/16 0749 03/18/16 1226 03/19/16 1200 03/19/16 1723 03/19/16 2139  GLUCAP 101* 153* 114* 198* 150*     IMAGING STUDIES Ct Abdomen Pelvis Wo Contrast  Result Date: 03/19/2016 CLINICAL DATA:  Patient normally resides at a skilled nursing facility and was a past medical history of multiple sclerosis was admitted on 9/26 with altered mental status and septic shock. It was felt that her presentation was due to pelvic abscess versus UTI versus decubitus ulcers. General surgery was consulted. CT scan was repeated, which suggested that the abscess abuts the urinary bladder. So urology was consulted as well. Patient underwent cystoscopy and biopsies of the lesion identified in the urinary bladder. EXAM: CT ABDOMEN AND PELVIS WITHOUT CONTRAST TECHNIQUE: Multidetector CT imaging of the abdomen and pelvis was performed following the standard protocol without  IV contrast. COMPARISON:  03/15/2016 and 03/10/2016. FINDINGS: Lower chest: Moderate right and small left pleural effusions. Dependent lower lobe opacity is noted consistent with atelectasis. Trace pericardial effusion. No evidence of pulmonary edema. Hepatobiliary: No focal liver abnormality is seen. No gallstones, gallbladder wall thickening, or biliary dilatation. Pancreas: Unremarkable. No pancreatic ductal dilatation or surrounding inflammatory changes. Spleen: Normal in size without focal abnormality. Adrenals/Urinary Tract: No adrenal masses. Nephrostomy tube curls within the left renal pelvis. There is mild left hydronephrosis. Bilobed 5.6 cm left midpole renal cyst is stable from prior exams. No other renal masses.  There are intrarenal stones in the renal pelvis largest measuring 2.4 cm. There is a stone in the left this is stable from the prior exams. Distal ureter which measures 2 cm in size. Mild right hydronephrosis. No right intrarenal stones. Probable small cyst along the posterior margin of the right kidney, stable. No right ureteral stone. Bladder is decompressed with a Foley catheter. A single small bubble of air projects along the peripheral margin of the left anterior bladder wall. Focal inflammatory change seen contiguous with the anterior superior bladder, which may reflect a poorly defined abscess, similar when compared to the most recent prior exam. Stomach/Bowel: There is wall thickening of the cecum and, to lesser degree, the terminal ileum, with inflammatory type change extending from the cecum towards the right anterior pelvis near the inflammatory changes that lies above the bladder. A normal appendix is visualized. No other colonic inflammatory process. Remainder of the small bowel is unremarkable. No stomach abnormality. Vascular/Lymphatic: Mild atherosclerotic calcifications along a normal caliber abdominal aorta. Mildly enlarged right external iliac chain lymph node measuring 13 mm in short axis. There are several other prominent common and external iliac chain lymph nodes. There are prominent to borderline enlarged retroperitoneal lymph nodes most evident adjacent to the left kidney along the left margin of the aorta. Reproductive: Lobulated heterogeneous uterus consistent with multiple fibroids. No adnexal masses. Other: No abdominal wall hernia. There is subcutaneous edema most evident adjacent to the right pelvis and hip. There is a rectal type 2 that lies along the posterior margin of the perineum, posterior to the rectum and anus. Contrast has been injected, buckles along the posterior margin of the pelvis. This does not enter the rectum. Musculoskeletal: No bone resorption is seen to suggest  osteomyelitis. No significant bony abnormality. IMPRESSION: 1. Ill-defined focal inflammatory change contiguous with the anterior bladder dome is similar to the prior exam. No defined abscess. 2. There is also a right lower quadrant inflammation extending from the thick walled cecum. This may be the source of the inflammation. A normal appendix is noted. 3. 2 cm stone in the distal left ureter, stable from the most recent prior exam. Well-positioned left nephrostomy tube. Mild residual left hydronephrosis. 4. Mild right hydronephrosis likely due to the right lower quadrant inflammation or possibly X transit compression by the uterus, which is mildly enlarged by apparent fibroids. The mild hydronephrosis is similar to the prior exam. 5. Moderate right and small left pleural effusions with dependent lower lobe atelectasis, increased when compared to the CT dated 03/10/2016. Electronically Signed   By: Lajean Manes M.D.   On: 03/19/2016 16:40   Ct Abdomen Pelvis Wo Contrast  Result Date: 03/08/2016 CLINICAL DATA:  55 y/o  F; altered mental status and lethargy. EXAM: CT ABDOMEN AND PELVIS WITHOUT CONTRAST TECHNIQUE: Multidetector CT imaging of the abdomen and pelvis was performed following the standard protocol without IV contrast. COMPARISON:  12/22/2015 CT abdomen and pelvis. FINDINGS: Lower chest: Minor bibasilar atelectasis. Trace right pleural effusion. Hepatobiliary: No focal liver abnormality. Gallstones. No intra or extrahepatic biliary ductal dilatation. Pancreas: Unremarkable. No pancreatic ductal dilatation or surrounding inflammatory changes. Spleen: Moderately atrophic. Adrenals/Urinary Tract: Normal adrenal glands. Large stone in the left renal pelvis and additional small caliceal stones. Unchanged stone within the left distal ureter. Left-sided interpolar pelvic pelvic cysts. Left-sided percutaneous nephrostomy tube coiling in the pelvis. Mild right-sided stable in in name in pelvicaliectasis. No  hydroureter. No right-sided obstructing stone or lesion identified. Collapsed bladder around Foley catheter. Several small diverticulum. Diffuse thickening of the bladder wall. There is a fluid collection superior to the bladder abutting a loop of ileum in the right lower quadrant measuring 38 x 19 x 27 mm (AP x ML x CC), series 3, image 79 and series 4, image 52. The collection appears contiguous with additional areas of fluid anterior to the cecum (series 3, image 77). Additionally, there is an increase in inflammatory changes, small bowel wall thickening, and thickening of the wall of the cecum with fluid tracking along the right pericolic gutter. In the right lower quadrant in comparison with the prior CT of the abdomen and pelvis. Stomach/Bowel: Wall thickening of cecum and small bowel loops in the right lower quadrant. Interval diminution in small bowel dilatation. Normal appendix. Vascular/Lymphatic: Aortic atherosclerosis. Prominent retroperitoneal lymph nodes are probably reactive. Reproductive: Stable myomatous uterus.  No adnexal mass identified. Other: No abdominal wall hernia or abnormality. No abdominopelvic ascites. Musculoskeletal: No acute or significant osseous findings. IMPRESSION: Worsening inflammatory changes in the pelvis and right lower quadrant with interval development of peritoneal fluid collections probably representing abscess/phlegmon, and worsening inflammatory wall thickening of cecum, right lower quadrant small bowel loops, and the dome of the bladder. The abscess/phlegmon appears associated with the dome of the bladder and probably is related to urinary tract infection. Electronically Signed   By: Kristine Garbe M.D.   On: 03/08/2016 18:19   Dg Chest 1 View  Result Date: 03/08/2016 CLINICAL DATA:  55 y/o  F; central line placement. EXAM: CHEST 1 VIEW COMPARISON:  03/08/2016 chest radiograph. FINDINGS: Left central line placement with tip projecting over lower SVC.  Stable cardiomediastinal silhouette within normal limits. Low lung volumes. Clear lungs. No pneumothorax or pleural effusion. No acute osseous abnormality identified. IMPRESSION: No active disease. Central venous catheter with tip projecting over lower SVC. Electronically Signed   By: Kristine Garbe M.D.   On: 03/08/2016 17:45   Ct Pelvis Wo Contrast  Result Date: 03/15/2016 CLINICAL DATA:  Anterior pelvic fluid collection concerning for abscess lower abdominal tenderness. Concern for fistula to the bladder. EXAM: CT PELVIS WITHOUT CONTRAST TECHNIQUE: Multidetector CT imaging of the pelvis was performed following the standard protocol without intravenous contrast. Contrast was instilled into the bladder for a ten-day ACT in cystogram to evaluate for fistula. COMPARISON:  03/10/2016, 03/08/2016 FINDINGS: Urinary Tract: Noncontrast imaging performed of the pelvis followed by installation of contrast through the existing Foley catheter at 50 and 200 cc volume. Repeat imaging performed through the bladder. Upon instilling contrast within the bladder, there is extravasation of contrast outside the bladder lumen along the bladder dome which communicates with the small measured 3.2 cm fluid collection. No definite contrast demonstrated within the adjacent bowel. Postvoid imaging also performed and the extraluminal contrast persist however does not definitively communicate with a bowel lumen. Appearance is compatible with extraperitoneal bladder injury/rupture with a small adjacent fluid collection representing  a urinoma rather than a true abscess. Difficult to completely exclude fistula to bowel. Consider further evaluation with dedicated fluoroscopic cystogram. Additionally, there are numerous small bladder diverticula and thickening of the bladder wall. Bowel: Negative for bowel obstruction. Scattered colonic diverticulosis. Normal appearing appendix. Limited assessment of pelvis without IV or bowel  contrast. Vascular/Lymphatic: Iliac atherosclerosis noted. No aneurysm or retroperitoneal hemorrhage. Reproductive: Lobulated appearing uterus compatible with small fibroids. No adnexal mass. Other: No inguinal abnormality or hernia. Lower abdominal wall appears intact. Diffuse body wall anasarca noted. Musculoskeletal: No significant or acute osseous finding. IMPRESSION: CT cystogram with a total volume of 200 cc demonstrates extra luminal leakage of contrast along the bladder dome which communicates with the measured 3.2 cm pelvic fluid collection. No definite contrast communication to adjacent bowel. Appearance is compatible with extraperitoneal bladder injury/ rupture and a small adjacent urinoma. Difficult to completely exclude fistula. Consider additional evaluation with fluoroscopic cystogram. Thickened bladder wall with numerous diverticula. Electronically Signed   By: Jerilynn Mages.  Shick M.D.   On: 03/15/2016 13:44   Ct Abdomen Pelvis W Contrast  Result Date: 03/10/2016 CLINICAL DATA:  55 year old female inpatient with multiple sclerosis admitted with sepsis and draining decubitus ulcers, with suggestion of pelvic abscess on CT study from 2 days prior, presenting for follow-up EXAM: CT ABDOMEN AND PELVIS WITH CONTRAST TECHNIQUE: Multidetector CT imaging of the abdomen and pelvis was performed using the standard protocol following bolus administration of intravenous contrast. CONTRAST:  114mL ISOVUE-300 IOPAMIDOL (ISOVUE-300) INJECTION 61% COMPARISON:  03/08/2016 CT abdomen/ pelvis. FINDINGS: Lower chest: Right middle lobe 3 mm solid pulmonary nodule (series 6/image 3), stable from 03/08/2016. New small layering bilateral pleural effusions with associated mild compressive atelectasis in the dependent lower lobes. Stable small posterior pericardial effusion/thickening. Hepatobiliary: Normal liver size. Two subcentimeter hypodense liver lesions are too small to characterize and stable. No new liver lesions. Normal  gallbladder with no radiopaque cholelithiasis. No biliary ductal dilatation. Pancreas: Normal, with no mass or duct dilation. Spleen: Normal size. No mass. Adrenals/Urinary Tract: Mild diffuse adrenal thickening bilaterally, unchanged, with no discrete adrenal nodules. Mild right hydroureteronephrosis is mildly decreased in the interval. Stable 10 mm stone in the left pelvic ureter approximately 4 cm above the left UVJ. Stable position of left percutaneous nephrostomy tube with the pigtail tip in the left renal pelvis. No residual left hydronephrosis. Two adjacent stones in the left renal pelvis measuring 16 mm in 15 mm. There is diffuse urothelial wall thickening throughout the bilateral renal collecting systems and bilateral ureters, not appreciably changed. There is symmetric bilateral perinephric fat stranding. Subcentimeter hypodense renal cortical lesions in the posterior lower right kidney and mid to upper left kidney are too small to characterize and unchanged. Simple 5.6 cm renal cyst in the lower left kidney. No focal drainable perinephric fluid collections. Bladder is collapsed by indwelling Foley catheter. Gas within the bladder lumen and upper left renal collecting system is consistent with instrumentation. Probable persistent diffuse bladder wall thickening. Stomach/Bowel: Grossly normal stomach. Normal caliber small bowel with no small bowel wall thickening. Normal appendix. Stable mild wall thickening in the cecum. Otherwise unremarkable large bowel, with no additional sites of large bowel wall thickening. Oral contrast progresses to the rectum. Vascular/Lymphatic: Atherosclerotic nonaneurysmal abdominal aorta. Patent portal, splenic, hepatic and renal veins. Stable mildly enlarged left para-aortic lymph nodes measuring up to 1.0 cm (series 2/ image 43). Reproductive: Stable enlarged myomatous uterus.  No adnexal mass. Other: No free intraperitoneal air. There is an irregular right anterior  pelvic  fluid collection with thick enhancing wall measuring 9.5 x 3.8 x 2.9 cm (series 5/ image 11), which is intimately associated with the right superior bladder wall and abuts the cecum and right pelvic small bowel loops, and which measured 8.4 x 3.7 x 2.7 cm on the 03/08/2016, mildly increased. Trace pelvic cul-de-sac and perihepatic ascites, minimally increased. Mild anasarca, increased. Stable bilateral ischial decubitus ulcers with soft tissue extending from the skin surface to the ischial periosteum without discrete bone erosions. Stable sacral decubitus ulcer with soft tissue extending from the skin surface to the lower left sacral periosteum without discrete sacral erosion. No superficial fluid collections. Musculoskeletal: No aggressive appearing focal osseous lesions. Minimal thoracolumbar spondylosis. IMPRESSION: 1. Irregular right anterior pelvic abscess has mildly increased in size. This collection is intimately associated with the right upper bladder wall, cecum and right lower quadrant pelvic small bowel loops. No free air. Appendix appears normal. 2. Persistent urothelial wall thickening throughout the bilateral urinary tracts suggesting bilateral ascending urinary tract infection. Mild right hydroureteronephrosis is decreased and left hydronephrosis is resolved status post Foley catheter placement. Stable left renal pelvis and left pelvic ureteral stones. 3. Increased findings of third-spacing including small increased bilateral pleural effusions, mild anasarca and trace ascites. 4. Stable bilateral ischial and lower sacral decubitus ulcers. 5. Additional findings include enlarged myomatous uterus, stable mild left para-aortic adenopathy (nonspecific and likely reactive) and mild adrenal hyperplasia. Electronically Signed   By: Ilona Sorrel M.D.   On: 03/10/2016 16:19   Dg Chest Port 1 View  Result Date: 03/19/2016 CLINICAL DATA:  Pulmonary edema.  Hypertension. EXAM: PORTABLE CHEST 1 VIEW  COMPARISON:  March 17, 2016 FINDINGS: New right central there has been resolution of edema compared to recent study. There are small pleural effusions bilaterally. There remains cardiomegaly. The pulmonary vascularity is normal. No adenopathy. No bone lesions. Catheter tip is in the right atrium with the tip approximately 4 cm beyond the cavoatrial junction. No pneumothorax. IMPRESSION: Central catheter tip in right atrium. No pneumothorax. Small pleural effusions and cardiomegaly. Edema has resolved. No new opacity. Cardiac silhouette is stable. Electronically Signed   By: Lowella Grip III M.D.   On: 03/19/2016 07:12   Dg Chest Port 1 View  Result Date: 03/17/2016 CLINICAL DATA:  Cough. EXAM: PORTABLE CHEST 1 VIEW COMPARISON:  Radiograph of March 10, 2016. FINDINGS: Stable cardiomediastinal silhouette. Left internal jugular catheter is unchanged with distal tip in expected position of the SVC. No pneumothorax is noted. Increased bilateral perihilar and basilar interstitial densities are noted concerning for edema. Stable mild left pleural effusion is noted. Bony thorax is unremarkable. IMPRESSION: Bilateral perihilar and basilar edema is noted. Stable mild left pleural effusion. Electronically Signed   By: Marijo Conception, M.D.   On: 03/17/2016 09:43   Dg Chest Port 1 View  Result Date: 03/10/2016 CLINICAL DATA:  Aspiration. EXAM: PORTABLE CHEST 1 VIEW COMPARISON:  03/08/2016. FINDINGS: Left IJ line in stable position. Mediastinum hilar structures are stable. Heart size stable. Low lung volumes with mild bibasilar atelectasis and/or infiltrates. Small left pleural effusion cannot be excluded. No pneumothorax. Drainage catheter noted over left upper abdomen. IMPRESSION: 1. Left IJ line in stable position. 2. Low lung volumes with mild bibasilar atelectasis and/or infiltrates. Small left pleural effusion cannot be excluded. Electronically Signed   By: Marcello Moores  Register   On: 03/10/2016 07:15   Dg  Chest Port 1 View  Result Date: 03/08/2016 CLINICAL DATA:  Fever, abdominal pain, tachycardia EXAM:  PORTABLE CHEST 1 VIEW COMPARISON:  Portable chest x-ray of 12/30/2015 FINDINGS: No active infiltrate or effusion is seen. Mediastinal and hilar contours are unremarkable. The heart is within normal limits in size. No bony abnormality is seen. IMPRESSION: No active disease. Electronically Signed   By: Ivar Drape M.D.   On: 03/08/2016 13:25   Ir Nephrostomy Exchange Left  Result Date: 02/23/2016 INDICATION: History of urosepsis, post ultrasound fluoroscopic guided left-sided percutaneous nephrostomy catheter placement on 12/25/2015. Patient presents today for routine fluoroscopic guided percutaneous nephrostomy catheter exchange. EXAM: FLUOROSCOPIC GUIDED LEFT SIDED NEPHROSTOMY CATHETER EXCHANGE COMPARISON:  CT scan the abdomen pelvis - 12/22/2015; ultrasound fluoroscopic guided left-sided percutaneous nephrostomy catheter placement - 12/25/2015 CONTRAST:  8 mL Isovue-300 administered into the collecting system FLUOROSCOPY TIME:  1 minute 48 seconds (24 mGy) COMPLICATIONS: None immediate. TECHNIQUE: Informed written consent was obtained from the patient after a discussion of the risks, benefits and alternatives to treatment. Questions regarding the procedure were encouraged and answered. A timeout was performed prior to the initiation of the procedure. The left flank and external portion of existing nephrostomy catheter were prepped and draped in the usual sterile fashion. A sterile drape was applied covering the operative field. Maximum barrier sterile technique with sterile gowns and gloves were used for the procedure. A timeout was performed prior to the initiation of the procedure. A pre procedural spot fluoroscopic image was obtained after contrast was injected via the existing nephrostomy catheter demonstrating appropriate positioning within the renal pelvis. The existing nephrostomy catheter was cut and  cannulated with a Benson wire which was coiled within the renal pelvis. Under intermittent fluoroscopic guidance, the existing nephrostomy catheter was exchanged for a new 10.2 [place] all-purpose drainage catheter. Contrast injection confirmed appropriate positioning within the renal pelvis and a post exchange fluoroscopic image was obtained. The catheter was locked and secured to the skin with a suture. A dressing was placed. The patient tolerated the procedure well without immediate postprocedural complication. FINDINGS: The existing nephrostomy catheter is appropriately positioned and functioning. Grossly unchanged positioning of known pelvic stones about the central aspect of the nephrostomy coil. After successful fluoroscopic guided exchange, the new nephrostomy catheter is coiled and locked within the left renal pelvis. IMPRESSION: Successful fluoroscopic guided exchange of left sided 10.2 French percutaneous nephrostomy catheter. Electronically Signed   By: Sandi Mariscal M.D.   On: 02/23/2016 17:28    DISCHARGE EXAMINATION: Vitals:   03/19/16 0527 03/19/16 1400 03/19/16 2136 03/20/16 0600  BP: 125/62 137/77 (!) 129/55 (!) 124/43  Pulse: 71  91 81  Resp: 16  16 16   Temp: 98.6 F (37 C) 99.3 F (37.4 C) 99.4 F (37.4 C) 98.2 F (36.8 C)  TempSrc: Oral Oral Oral Oral  SpO2: 100% 100% 99% 100%  Weight:    73.9 kg (163 lb)  Height:       General appearance: alert, cooperative, appears stated age and no distress Resp: clear to auscultation bilaterally Cardio: regular rate and rhythm, S1, S2 normal, no murmur, click, rub or gallop GI: Abdomen is soft. Tenderness has improved in the lower abdomen, especially with the suprapubic area. No rebound, rigidity or guarding. No masses or organomegaly. Bowel sounds are present. Extremities: extremities normal, atraumatic, no cyanosis or edema  DISPOSITION: Back to SNF  Discharge Instructions    Call MD for:  difficulty breathing, headache or visual  disturbances    Complete by:  As directed    Call MD for:  extreme fatigue    Complete  by:  As directed    Call MD for:  hives    Complete by:  As directed    Call MD for:  persistant dizziness or light-headedness    Complete by:  As directed    Call MD for:  persistant nausea and vomiting    Complete by:  As directed    Call MD for:  severe uncontrolled pain    Complete by:  As directed    Call MD for:  temperature >100.4    Complete by:  As directed    Discharge instructions    Complete by:  As directed    CBC and basic metabolic panel in 4-5 days. Needs to be given cefepime for 2 weeks as mentioned on the medication list. Will need to follow-up with her urologist, Dr. Beatrix Fetters for further management of the nephrostomy tube. We will need to leave the Foley catheter in place.  You were cared for by a hospitalist during your hospital stay. If you have any questions about your discharge medications or the care you received while you were in the hospital after you are discharged, you can call the unit and asked to speak with the hospitalist on call if the hospitalist that took care of you is not available. Once you are discharged, your primary care physician will handle any further medical issues. Please note that NO REFILLS for any discharge medications will be authorized once you are discharged, as it is imperative that you return to your primary care physician (or establish a relationship with a primary care physician if you do not have one) for your aftercare needs so that they can reassess your need for medications and monitor your lab values. If you do not have a primary care physician, you can call [phone] for a physician referral.      ALLERGIES: No Known Allergies   Current Discharge Medication List    START taking these medications   Details  acetaminophen (TYLENOL) 325 MG tablet Take 2 tablets (650 mg total) by mouth every 6 (six) hours as needed for fever.    ceFEPIme 2 g  in dextrose 5 % 50 mL Inject 2 g into the vein every 12 (twelve) hours. For 2 weeks    potassium chloride (K-DUR,KLOR-CON) 10 MEQ tablet Take 1 tablet (10 mEq total) by mouth daily.      CONTINUE these medications which have NOT CHANGED   Details  albuterol (PROVENTIL) (2.5 MG/3ML) 0.083% nebulizer solution Take 3 mLs (2.5 mg total) by nebulization every 6 (six) hours as needed for wheezing or shortness of breath. Qty: 75 mL, Refills: 12    Amino Acids-Protein Hydrolys (FEEDING SUPPLEMENT, PRO-STAT SUGAR FREE 64,) LIQD Take 30 mLs by mouth 2 (two) times daily. Qty: 900 mL, Refills: 0    atorvastatin (LIPITOR) 10 MG tablet Take 10 mg by mouth at bedtime.     baclofen (LIORESAL) 10 MG tablet Take 1 tablet (10 mg total) by mouth 3 (three) times daily. Qty: 90 each, Refills: 0    famotidine (PEPCID) 20 MG tablet Take 1 tablet (20 mg total) by mouth 2 (two) times daily. Qty: 60 tablet, Refills: 0    furosemide (LASIX) 20 MG tablet Take 40 mg by mouth 2 (two) times daily.    gabapentin (NEURONTIN) 100 MG capsule Take 1 capsule (100 mg total) by mouth at bedtime. Qty: 30 capsule, Refills: 0    glipiZIDE (GLUCOTROL) 5 MG tablet Take 5 mg by mouth 2 (two) times daily. Refills:  0    lisinopril (PRINIVIL,ZESTRIL) 2.5 MG tablet Take 2.5 mg by mouth daily. Refills: 0    metFORMIN (GLUCOPHAGE) 500 MG tablet Take 500 mg by mouth 2 (two) times daily.    Omega 3 1000 MG CAPS Take 1,000 mg by mouth daily.    polyethylene glycol (MIRALAX / GLYCOLAX) packet Take 17 g by mouth 2 (two) times daily. Qty: 14 each, Refills: 0    rivaroxaban (XARELTO) 20 MG TABS tablet Take 1 tablet (20 mg total) by mouth daily with supper. Reported on 08/12/2015 Qty: 30 tablet, Refills: 0    senna-docusate (SENOKOT-S) 8.6-50 MG tablet Take 1 tablet by mouth 2 (two) times daily. Qty: 30 tablet, Refills: 0    solifenacin (VESICARE) 5 MG tablet Take 1 tablet (5 mg total) by mouth daily. Qty: 30 tablet, Refills: 0     feeding supplement, ENSURE ENLIVE, (ENSURE ENLIVE) LIQD Take 237 mLs by mouth 2 (two) times daily between meals. Qty: 237 mL, Refills: 12    Multiple Vitamin (MULTIVITAMIN WITH MINERALS) TABS tablet Take 1 tablet by mouth daily. Qty: 30 tablet, Refills: 0          Contact information for follow-up providers    DAHLSTEDT, Lillette Boxer, MD. Call in 1 week(s).   Specialty:  Urology Why:  discuss PCNL Contact information: [place] [address] [phone]            Contact information for after-discharge care    Destination    [name] .   Specialty:  [place] information: [address] [place] [phone]                  TOTAL DISCHARGE TIME: 90 minutes  [facility] Pager [phone]  03/20/2016, 10:23 AM

## 2016-03-20 NOTE — Consult Note (Signed)
[facility] Consult Note  Referring Provider: No ref. provider found Primary Care Physician:  No primary care provider on file. Primary Gastroenterologist:  Dr.  Laurel Dimmer Complaint: Pelvic abscess with possible cecal involvement HPI: [name] is an 55 y.o. black female  nursing home resident who presented with pelvic abscess in the right lower quadrant contiguous with bowel and bladder. On October 3 she had a cystogram which showed vesicular diverticuli with some extravasation of contrast into the abscess space. There is with mild cecal inflammation on serial CT scans that has been increasing. There remains a slightly improving pelvic abscess with a 2 cm fluid collection as of October 7. The appendix appears normal with some persistent cecal thickening and terminal ileal thickening there is no suspicion of an intraluminal cecal mass.. The patient currently is without any pain. She states her bowels are moving well and has had no rectal bleeding.  Past Medical History:  Diagnosis Date  . Acute renal failure superimposed on stage 3 chronic kidney disease ([place]) 07/21/2015  . Anemia    chronic   . Diabetes mellitus without complication ([place])   . Diabetic neuropathy ([place])   . DVT (deep venous thrombosis) ([place])   . DVT (deep venous thrombosis) ([place])   . GERD (gastroesophageal reflux disease)   . Hypertension   . Left nephrolithiasis   . Leukocytosis   . Lymph edema    chronic   . Malnutrition of moderate degree 07/21/2015  . Multiple sclerosis diagnosed 2002-not on Therapy any longer 06/26/2012  . Muscle weakness (generalized)   . Neuromuscular disorder ([place])   . Neuromuscular dysfunction of bladder   . Polyneuropathy ([place])   . Presence of indwelling urinary catheter   . Protein calorie malnutrition ([place])   . Stage 4 decubitus ulcer ([place]) 07/05/2015  . Stroke ([place])   . UTI (lower urinary tract infection)     Past Surgical History:  Procedure Laterality Date  .  ESOPHAGOGASTRODUODENOSCOPY Left 01/02/2015   Procedure: ESOPHAGOGASTRODUODENOSCOPY (EGD);  Surgeon: Arta Silence, MD;  Location: [facility];  Service: Endoscopy;  Laterality: Left;  . HERNIA MESH REMOVAL    . IR GENERIC HISTORICAL  02/23/2016   IR NEPHROSTOMY EXCHANGE LEFT 02/23/2016 Sandi Mariscal, MD [facility]-INTER[facility] RAD  . NEPHROSTOMY TUBE PLACEMENT ([place] HX)    . TRANSURETHRAL RESECTION OF BLADDER TUMOR N/A 03/16/2016   Procedure: CYSTOSCOPY BLADDER BIOPSY;  Surgeon: Franchot Gallo, MD;  Location: [facility];  Service: Urology;  Laterality: N/A;  . TUBAL LIGATION    . tubes tided      Medications Prior to Admission  Medication Sig Dispense Refill  . albuterol (PROVENTIL) (2.5 MG/3ML) 0.083% nebulizer solution Take 3 mLs (2.5 mg total) by nebulization every 6 (six) hours as needed for wheezing or shortness of breath. 75 mL 12  . Amino Acids-Protein Hydrolys (FEEDING SUPPLEMENT, PRO-STAT SUGAR FREE 64,) LIQD Take 30 mLs by mouth 2 (two) times daily. 900 mL 0  . atorvastatin (LIPITOR) 10 MG tablet Take 10 mg by mouth at bedtime.     . baclofen (LIORESAL) 10 MG tablet Take 1 tablet (10 mg total) by mouth 3 (three) times daily. 90 each 0  . famotidine (PEPCID) 20 MG tablet Take 1 tablet (20 mg total) by mouth 2 (two) times daily. 60 tablet 0  . furosemide (LASIX) 20 MG tablet Take 40 mg by mouth 2 (two) times daily.    Marland Kitchen gabapentin (NEURONTIN) 100 MG capsule Take 1 capsule (100 mg total) by mouth at bedtime. [place]croft  capsule 0  . glipiZIDE (GLUCOTROL) 5 MG tablet Take 5 mg by mouth 2 (two) times daily.  0  . lisinopril (PRINIVIL,ZESTRIL) 2.5 MG tablet Take 2.5 mg by mouth daily.  0  . metFORMIN (GLUCOPHAGE) 500 MG tablet Take 500 mg by mouth 2 (two) times daily.    . Nutritional Supplements (NUTRITIONAL SHAKE) LIQD Take 240 mLs by mouth 2 (two) times daily. *Med Pass 2.0*    . Omega 3 1000 MG CAPS Take 1,000 mg by mouth daily.    . polyethylene glycol (MIRALAX / GLYCOLAX) packet Take 17 g by mouth 2 (two)  times daily. 14 each 0  . rivaroxaban (XARELTO) 20 MG TABS tablet Take 1 tablet (20 mg total) by mouth daily with supper. Reported on 08/12/2015 30 tablet 0  . senna-docusate (SENOKOT-S) 8.6-50 MG tablet Take 1 tablet by mouth 2 (two) times daily. 30 tablet 0  . solifenacin (VESICARE) 5 MG tablet Take 1 tablet (5 mg total) by mouth daily. 30 tablet 0  . feeding supplement, ENSURE ENLIVE, (ENSURE ENLIVE) LIQD Take 237 mLs by mouth 2 (two) times daily between meals. (Patient not taking: Reported on 03/08/2016) 237 mL 12  . Multiple Vitamin (MULTIVITAMIN WITH MINERALS) TABS tablet Take 1 tablet by mouth daily. (Patient not taking: Reported on 03/08/2016) 30 tablet 0    Allergies: No Known Allergies  Family History  Problem Relation Age of Onset  . Diabetes Mother   . Alzheimer's disease Mother   . Diabetes Father   . Diabetes Sister   . Scoliosis Sister     Social History:  reports that she quit smoking about 16 months ago. Her smoking use included Cigarettes. She has a 20.00 pack-year smoking history. She has never used smokeless tobacco. She reports that she drinks about 3.0 oz of alcohol per week . She reports that she does not use drugs.  Review of Systems: negative except as above   Blood pressure (!) 124/43, pulse 81, temperature 98.2 F (36.8 C), temperature source Oral, resp. rate 16, height _0  (1.651 m), weight 73.9 kg (163 lb), SpO2 100 %. Head: Normocephalic, without obvious abnormality, atraumatic Neck: no adenopathy, no carotid bruit, no JVD, supple, symmetrical, trachea midline and thyroid not enlarged, symmetric, no tenderness/mass/nodules Resp: clear to auscultation bilaterally Cardio: regular rate and rhythm, S1, S2 normal, no murmur, click, rub or gallop GI: Difficult to examine due to bed positioning, no obvious masses or tenderness Extremities: extremities normal, atraumatic, no cyanosis or edema  Results for orders placed or performed during the hospital encounter of  03/08/16 (from the past 48 hour(s))  Glucose, capillary     Status: Abnormal   Collection Time: 03/18/16 12:26 PM  Result Value Ref Range   Glucose-Capillary 153 (H) 65 - 99 mg/dL  Basic metabolic panel     Status: Abnormal   Collection Time: 03/19/16  4:50 AM  Result Value Ref Range   Sodium 141 135 - 145 mmol/L   Potassium 3.5 3.5 - 5.1 mmol/L   Chloride 107 101 - 111 mmol/L   CO2 31 22 - 32 mmol/L   Glucose, Bld 101 (H) 65 - 99 mg/dL   BUN 8 6 - 20 mg/dL   Creatinine, Ser 0.40 (L) 0.44 - 1.00 mg/dL   Calcium 9.3 8.9 - 10.3 mg/dL   GFR calc non Af Amer >60 >60 mL/min   GFR calc Af Amer >60 >60 mL/min    Comment: (NOTE) The eGFR has been calculated using the CKD EPI  equation. This calculation has not been validated in all clinical situations. eGFR's persistently <60 mL/min signify possible Chronic Kidney Disease.    Anion gap 3 (L) 5 - 15  CBC     Status: Abnormal   Collection Time: 03/19/16  4:50 AM  Result Value Ref Range   WBC 12.4 (H) 4.0 - 10.5 K/uL   RBC 2.78 (L) 3.87 - 5.11 MIL/uL   Hemoglobin 8.0 (L) 12.0 - 15.0 g/dL   HCT 25.2 (L) 36.0 - 46.0 %   MCV 90.6 78.0 - 100.0 fL   MCH 28.8 26.0 - 34.0 pg   MCHC 31.7 30.0 - 36.0 g/dL   RDW 16.8 (H) 11.5 - 15.5 %   Platelets 526 (H) 150 - 400 K/uL  Glucose, capillary     Status: Abnormal   Collection Time: 03/19/16 12:00 PM  Result Value Ref Range   Glucose-Capillary 114 (H) 65 - 99 mg/dL  Glucose, capillary     Status: Abnormal   Collection Time: 03/19/16  5:23 PM  Result Value Ref Range   Glucose-Capillary 198 (H) 65 - 99 mg/dL  Glucose, capillary     Status: Abnormal   Collection Time: 03/19/16  9:39 PM  Result Value Ref Range   Glucose-Capillary 150 (H) 65 - 99 mg/dL   Ct Abdomen Pelvis Wo Contrast  Result Date: 03/19/2016 CLINICAL DATA:  Patient normally resides at a skilled nursing facility and was a past medical history of multiple sclerosis was admitted on 9/26 with altered mental status and septic shock.  It was felt that her presentation was due to pelvic abscess versus UTI versus decubitus ulcers. General surgery was consulted. CT scan was repeated, which suggested that the abscess abuts the urinary bladder. So urology was consulted as well. Patient underwent cystoscopy and biopsies of the lesion identified in the urinary bladder. EXAM: CT ABDOMEN AND PELVIS WITHOUT CONTRAST TECHNIQUE: Multidetector CT imaging of the abdomen and pelvis was performed following the standard protocol without IV contrast. COMPARISON:  03/15/2016 and 03/10/2016. FINDINGS: Lower chest: Moderate right and small left pleural effusions. Dependent lower lobe opacity is noted consistent with atelectasis. Trace pericardial effusion. No evidence of pulmonary edema. Hepatobiliary: No focal liver abnormality is seen. No gallstones, gallbladder wall thickening, or biliary dilatation. Pancreas: Unremarkable. No pancreatic ductal dilatation or surrounding inflammatory changes. Spleen: Normal in size without focal abnormality. Adrenals/Urinary Tract: No adrenal masses. Nephrostomy tube curls within the left renal pelvis. There is mild left hydronephrosis. Bilobed 5.6 cm left midpole renal cyst is stable from prior exams. No other renal masses. There are intrarenal stones in the renal pelvis largest measuring 2.4 cm. There is a stone in the left this is stable from the prior exams. Distal ureter which measures 2 cm in size. Mild right hydronephrosis. No right intrarenal stones. Probable small cyst along the posterior margin of the right kidney, stable. No right ureteral stone. Bladder is decompressed with a Foley catheter. A single small bubble of air projects along the peripheral margin of the left anterior bladder wall. Focal inflammatory change seen contiguous with the anterior superior bladder, which may reflect a poorly defined abscess, similar when compared to the most recent prior exam. Stomach/Bowel: There is wall thickening of the cecum and,  to lesser degree, the terminal ileum, with inflammatory type change extending from the cecum towards the right anterior pelvis near the inflammatory changes that lies above the bladder. A normal appendix is visualized. No other colonic inflammatory process. Remainder of the small bowel is  unremarkable. No stomach abnormality. Vascular/Lymphatic: Mild atherosclerotic calcifications along a normal caliber abdominal aorta. Mildly enlarged right external iliac chain lymph node measuring 13 mm in short axis. There are several other prominent common and external iliac chain lymph nodes. There are prominent to borderline enlarged retroperitoneal lymph nodes most evident adjacent to the left kidney along the left margin of the aorta. Reproductive: Lobulated heterogeneous uterus consistent with multiple fibroids. No adnexal masses. Other: No abdominal wall hernia. There is subcutaneous edema most evident adjacent to the right pelvis and hip. There is a rectal type 2 that lies along the posterior margin of the perineum, posterior to the rectum and anus. Contrast has been injected, buckles along the posterior margin of the pelvis. This does not enter the rectum. Musculoskeletal: No bone resorption is seen to suggest osteomyelitis. No significant bony abnormality. IMPRESSION: 1. Ill-defined focal inflammatory change contiguous with the anterior bladder dome is similar to the prior exam. No defined abscess. 2. There is also a right lower quadrant inflammation extending from the thick walled cecum. This may be the source of the inflammation. A normal appendix is noted. 3. 2 cm stone in the distal left ureter, stable from the most recent prior exam. Well-positioned left nephrostomy tube. Mild residual left hydronephrosis. 4. Mild right hydronephrosis likely due to the right lower quadrant inflammation or possibly X transit compression by the uterus, which is mildly enlarged by apparent fibroids. The mild hydronephrosis is similar  to the prior exam. 5. Moderate right and small left pleural effusions with dependent lower lobe atelectasis, increased when compared to the CT dated 03/10/2016. Electronically Signed   By: Lajean Manes M.D.   On: 03/19/2016 16:40   Dg Chest Port 1 View  Result Date: 03/19/2016 CLINICAL DATA:  Pulmonary edema.  Hypertension. EXAM: PORTABLE CHEST 1 VIEW COMPARISON:  March 17, 2016 FINDINGS: New right central there has been resolution of edema compared to recent study. There are small pleural effusions bilaterally. There remains cardiomegaly. The pulmonary vascularity is normal. No adenopathy. No bone lesions. Catheter tip is in the right atrium with the tip approximately 4 cm beyond the cavoatrial junction. No pneumothorax. IMPRESSION: Central catheter tip in right atrium. No pneumothorax. Small pleural effusions and cardiomegaly. Edema has resolved. No new opacity. Cardiac silhouette is stable. Electronically Signed   By: Lowella Grip III M.D.   On: 03/19/2016 07:12    Assessment: Pelvic abscess in the right lower quadrant abutting the bladder and cecum and terminal ileum with evidence of bladder perforation on previous study. Plan:  From thorough review of serial CT scans, I do not think this is likely a primary colonic process although cannot rule out some sort of fistulous connection with the abscess with primary source appearing to be from the bladder. Colonoscopy would be quite difficult in this patient and very unlikely to be diagnostic, even if her cecum is reachable. Radiology felt that if felt useful to do another follow-up CT scan it should be with IV contrast to see if there is an amenable fluid collection they can drain. We'll sign off for now. Please call back if further GI input needed. Marquist Binstock C 03/20/2016, 8:10 AM  Pager [phone] If no answer or after 5 PM call [phone]

## 2016-03-20 NOTE — Progress Notes (Signed)
Patient to be discharged to [facility], report called to [place].

## 2016-03-21 ENCOUNTER — Encounter: Payer: Self-pay | Admitting: Internal Medicine

## 2016-03-21 ENCOUNTER — Non-Acute Institutional Stay (SKILLED_NURSING_FACILITY): Payer: Medicare Other | Admitting: Internal Medicine

## 2016-03-21 DIAGNOSIS — N3080 Other cystitis without hematuria: Secondary | ICD-10-CM

## 2016-03-21 DIAGNOSIS — E785 Hyperlipidemia, unspecified: Secondary | ICD-10-CM

## 2016-03-21 DIAGNOSIS — N183 Chronic kidney disease, stage 3 unspecified: Secondary | ICD-10-CM

## 2016-03-21 DIAGNOSIS — B965 Pseudomonas (aeruginosa) (mallei) (pseudomallei) as the cause of diseases classified elsewhere: Secondary | ICD-10-CM | POA: Diagnosis not present

## 2016-03-21 DIAGNOSIS — E118 Type 2 diabetes mellitus with unspecified complications: Secondary | ICD-10-CM | POA: Diagnosis not present

## 2016-03-21 DIAGNOSIS — E1169 Type 2 diabetes mellitus with other specified complication: Secondary | ICD-10-CM

## 2016-03-21 DIAGNOSIS — R5381 Other malaise: Secondary | ICD-10-CM

## 2016-03-21 DIAGNOSIS — D638 Anemia in other chronic diseases classified elsewhere: Secondary | ICD-10-CM

## 2016-03-21 DIAGNOSIS — I890 Lymphedema, not elsewhere classified: Secondary | ICD-10-CM

## 2016-03-21 DIAGNOSIS — D72828 Other elevated white blood cell count: Secondary | ICD-10-CM

## 2016-03-21 DIAGNOSIS — G35 Multiple sclerosis: Secondary | ICD-10-CM | POA: Diagnosis not present

## 2016-03-21 DIAGNOSIS — N739 Female pelvic inflammatory disease, unspecified: Secondary | ICD-10-CM

## 2016-03-21 DIAGNOSIS — I82402 Acute embolism and thrombosis of unspecified deep veins of left lower extremity: Secondary | ICD-10-CM

## 2016-03-21 DIAGNOSIS — I10 Essential (primary) hypertension: Secondary | ICD-10-CM

## 2016-03-21 DIAGNOSIS — E440 Moderate protein-calorie malnutrition: Secondary | ICD-10-CM | POA: Diagnosis not present

## 2016-03-21 DIAGNOSIS — K5909 Other constipation: Secondary | ICD-10-CM

## 2016-03-21 DIAGNOSIS — N319 Neuromuscular dysfunction of bladder, unspecified: Secondary | ICD-10-CM

## 2016-03-21 DIAGNOSIS — N200 Calculus of kidney: Secondary | ICD-10-CM

## 2016-03-21 DIAGNOSIS — K219 Gastro-esophageal reflux disease without esophagitis: Secondary | ICD-10-CM | POA: Diagnosis not present

## 2016-03-21 NOTE — Progress Notes (Signed)
LOCATION: [name]  PCP: No primary care provider on file.   Code Status: Full Code  Goals of care: Advanced Directive information Advanced Directives 03/08/2016  Does patient have an advance directive? Yes  Type of Advance Directive [place]  Does patient want to make changes to advanced directive? No - Patient declined  Copy of advanced directive(s) in chart? Yes  Would patient like information on creating an advanced directive? -  Pre-existing out of facility DNR order (yellow form or pink MOST form) -       Extended Emergency Contact Information Primary Emergency Contact: [facility] Address: [address]          [place], [place] [address] Johnnette Litter of [place] Phone: [phone] Mobile Phone: [phone] Relation: Relative Secondary Emergency Contact: Hughes,Michael Address: [address]          [place],  [address] Johnnette Litter of [place] Phone: [phone] Mobile Phone: [phone] Relation: Relative   No Known Allergies  Chief Complaint  Patient presents with  . Readmit To SNF    Readmission Visit     HPI:  Patient is a 55 y.o. female seen today for short term rehabilitation post hospital admission from 03/08/16-03/20/16 with septic shock and AMS from pelvic abscess and UTI. She was placed on iv fluids and iv antibiotics. She underwent cystoscopy and bladder biopsy on 03/16/16. The result was negative for malignancy. She was started on antibiotic for pseudomonas UTI. She then had pulmonary edema from iv fluids and required iv lasix.  She is s/p nephrostomy tube on 12/25/15 for left sided obstructive nephrolithiasis. She has medical history of multiple sclerosis, decubitus ulcer, ckd, CVA among others. She was seen by urology and ID during this hospital admission.    Review of Systems:  Constitutional: Negative for fever, chills, diaphoresis. Energy level is fair.  HENT: Negative for headache,  congestion, nasal discharge. Eyes: Negative for blurred vision, double vision and discharge.  Respiratory: Negative for cough, shortness of breath and wheezing.   Cardiovascular: Negative for chest pain, palpitations, leg swelling.  Gastrointestinal: Negative for heartburn, nausea, vomiting, abdominal pain. Last bowel movement was yesterday.  Genitourinary: Negative for dysuria. Positive for flank pain on the left side.  Musculoskeletal: Negative for fall in the facility.  Skin: Negative for itching, rash.  Neurological: Negative for dizziness. Psychiatric/Behavioral: Negative for depression   Past Medical History:  Diagnosis Date  . Acute renal failure superimposed on stage 3 chronic kidney disease ([place]) 07/21/2015  . Anemia    chronic   . Diabetes mellitus without complication ([place])   . Diabetic neuropathy ([place])   . DVT (deep venous thrombosis) ([place])   . DVT (deep venous thrombosis) ([place])   . GERD (gastroesophageal reflux disease)   . Hypertension   . Left nephrolithiasis   . Leukocytosis   . Lymph edema    chronic   . Malnutrition of moderate degree 07/21/2015  . Multiple sclerosis diagnosed 2002-not on Therapy any longer 06/26/2012  . Muscle weakness (generalized)   . Neuromuscular disorder ([place])   . Neuromuscular dysfunction of bladder   . Polyneuropathy ([place])   . Presence of indwelling urinary catheter   . Protein calorie malnutrition ([employer])   . Stage 4 decubitus ulcer ([place]) 07/05/2015  . Stroke ([place])   . UTI (lower urinary tract infection)    Past Surgical History:  Procedure Laterality Date  . ESOPHAGOGASTRODUODENOSCOPY Left 01/02/2015   Procedure: ESOPHAGOGASTRODUODENOSCOPY (EGD);  Surgeon: Arta Silence, MD;  Location: [facility];  Service: Endoscopy;  Laterality: Left;  . HERNIA MESH REMOVAL    . IR GENERIC HISTORICAL  02/23/2016   IR NEPHROSTOMY EXCHANGE LEFT 02/23/2016 Sandi Mariscal, MD [facility]  . NEPHROSTOMY TUBE PLACEMENT ([place] HX)    . TRANSURETHRAL  RESECTION OF BLADDER TUMOR N/A 03/16/2016   Procedure: CYSTOSCOPY BLADDER BIOPSY;  Surgeon: Franchot Gallo, MD;  Location: [facility];  Service: Urology;  Laterality: N/A;  . TUBAL LIGATION    . tubes tided     Social History:   reports that she quit smoking about 16 months ago. Her smoking use included Cigarettes. She has a 20.00 pack-year smoking history. She has never used smokeless tobacco. She reports that she drinks about 3.0 oz of alcohol per week . She reports that she does not use drugs.  Family History  Problem Relation Age of Onset  . Diabetes Mother   . Alzheimer's disease Mother   . Diabetes Father   . Diabetes Sister   . Scoliosis Sister     Medications:   Medication List       Accurate as of 03/21/16 12:22 PM. Always use your most recent med list.          acetaminophen 325 MG tablet Commonly known as:  TYLENOL Take 2 tablets (650 mg total) by mouth every 6 (six) hours as needed for fever.   albuterol (2.5 MG/3ML) 0.083% nebulizer solution Commonly known as:  PROVENTIL Take 3 mLs (2.5 mg total) by nebulization every 6 (six) hours as needed for wheezing or shortness of breath.   atorvastatin 10 MG tablet Commonly known as:  LIPITOR Take 10 mg by mouth at bedtime.   baclofen 10 MG tablet Commonly known as:  LIORESAL Take 1 tablet (10 mg total) by mouth 3 (three) times daily.   ceFEPIme 2 g in dextrose 5 % 50 mL Inject 2 g into the vein every 12 (twelve) hours. For 2 weeks   famotidine 20 MG tablet Commonly known as:  PEPCID Take 1 tablet (20 mg total) by mouth 2 (two) times daily.   feeding supplement (PRO-STAT SUGAR FREE 64) Liqd Take 30 mLs by mouth 2 (two) times daily.   furosemide 20 MG tablet Commonly known as:  LASIX Take 40 mg by mouth 2 (two) times daily.   gabapentin 100 MG capsule Commonly known as:  NEURONTIN Take 1 capsule (100 mg total) by mouth at bedtime.   glipiZIDE 5 MG tablet Commonly known as:  GLUCOTROL Take 5 mg by mouth 2  (two) times daily.   lisinopril 2.5 MG tablet Commonly known as:  PRINIVIL,ZESTRIL Take 2.5 mg by mouth daily.   metFORMIN 500 MG tablet Commonly known as:  GLUCOPHAGE Take 500 mg by mouth 2 (two) times daily.   multivitamin with minerals Tabs tablet Take 1 tablet by mouth daily.   Omega 3 1000 MG Caps Take 1,000 mg by mouth daily.   polyethylene glycol packet Commonly known as:  MIRALAX / GLYCOLAX Take 17 g by mouth 2 (two) times daily.   potassium chloride 10 MEQ tablet Commonly known as:  K-DUR,KLOR-CON Take 1 tablet (10 mEq total) by mouth daily.   rivaroxaban 20 MG Tabs tablet Commonly known as:  XARELTO Take 1 tablet (20 mg total) by mouth daily with supper. Reported on 08/12/2015   senna-docusate 8.6-50 MG tablet Commonly known as:  Senokot-S Take 1 tablet by mouth 2 (two) times daily.   solifenacin 5 MG tablet Commonly known as:  VESICARE  Take 1 tablet (5 mg total) by mouth daily.       Immunizations: Immunization History  Administered Date(s) Administered  . Influenza Split 06/26/2012  . Influenza-Unspecified 04/02/2015  . PPD Test 01/04/2015, 07/09/2015, 07/23/2015, 09/02/2015, 12/31/2015  . Pneumococcal-Unspecified 03/13/2014     Physical Exam:  Vitals:   03/21/16 1134  BP: 119/73  Pulse: 91  Resp: 20  Temp: 98.8 F (37.1 C)  TempSrc: Oral  SpO2: 97%  Weight: 163 lb (73.9 kg)  Height: 5\' 6"  (1.676 m)   Body mass index is 26.31 kg/m.   General- adult female, well built, in no acute distress Head- normocephalic, atraumatic Nose- no nasal discharge Throat- moist mucus membrane  Eyes- PERRLA, EOMI, no pallor, no icterus, no discharge Neck- no cervical lymphadenopathy Cardiovascular- normal s1,s2, no murmur Respiratory- bilateral clear to auscultation, no wheeze, no rhonchi, no crackles, no use of accessory muscles Abdomen- bowel sounds present, soft, non tender, foley catheter present Musculoskeletal- generalized weakness most prominent  in her LE and RUE Neurological- alert and oriented to person, place and time Skin- warm and dry, chronic skin changes to her lower legs noted Psychiatry- normal mood and affect   Labs reviewed: Basic Metabolic Panel:  Recent Labs  11/17/15 0709  03/09/16 0305  03/11/16 0406  03/12/16 1020  03/13/16 1500  03/16/16 0416 03/17/16 0425 03/18/16 0425 03/19/16 0450  NA 140  < > 141  < > 142  < >  --   < >  --   < > 144 142 143 141  K 3.7  < > 3.0*  < > 3.6  < >  --   < >  --   < > 3.0* 3.6 3.1* 3.5  CL 110  < > 111  < > 119*  < >  --   < >  --   < > 116* 114* 110 107  CO2 25  < > 22  < > 19*  < >  --   < >  --   < > 25 26 29 31   GLUCOSE 93  < > 131*  < > 76  < >  --   < >  --   < > 109* 96 90 101*  BUN 13  < > 38*  < > 9  < >  --   < >  --   < > 6 5* 5* 8  CREATININE 0.54  < > 1.18*  < > 0.52  < >  --   < >  --   < > 0.47 0.37* 0.42* 0.40*  CALCIUM 9.5  < > 9.2  < > 9.0  < >  --   < >  --   < > 8.8* 8.6* 8.9 9.3  MG 1.8  --  1.8  < > 1.6*  --  1.4*  --  1.9  --  1.5*  --   --   --   PHOS 3.2  --  2.8  --  2.0*  --   --   --   --   --   --   --   --   --   < > = values in this interval not displayed. Liver Function Tests:  Recent Labs  12/18/15 1035 12/19/15 0744 01/06/16 03/08/16 1240  AST 15 12* 12* 21  ALT 8* 7* 17 13*  ALKPHOS 40 50 67 40  BILITOT 0.7 0.5  --  0.6  PROT 6.6 6.6  --  7.7  ALBUMIN 2.2* 2.3*  --  3.2*   No results for input(s): LIPASE, AMYLASE in the last 8760 hours.  Recent Labs  07/20/15 1712  AMMONIA 21   CBC:  Recent Labs  12/29/15 1355  02/08/16 1724  03/10/16 1340  03/17/16 0425 03/18/16 0425 03/19/16 0450  WBC 16.2*  < > 13.3*  < > 17.3*  < > 13.4* 12.3* 12.4*  NEUTROABS 11.4*  --  7.3  --  14.2*  --   --   --   --   HGB 7.0*  < > 8.4*  < > 7.2*  < > 7.8* 7.9* 8.0*  HCT 23.0*  < > 25.5*  < > 21.0*  < > 23.4* 24.2* 25.2*  MCV 85.5  < > 89.5  < > 86.4  < > 89.3 90.3 90.6  PLT 809*  < > 412*  < > 263  < > 443* 507* 526*  < > = values  in this interval not displayed. Cardiac Enzymes:  Recent Labs  03/08/16 1500 03/08/16 2119 03/09/16 0305  TROPONINI 0.03* <0.03 <0.03   BNP: Invalid input(s): POCBNP CBG:  Recent Labs  03/19/16 1200 03/19/16 1723 03/19/16 2139  GLUCAP 114* 198* 150*    Radiological Exams: Ct Abdomen Pelvis Wo Contrast  Result Date: 03/19/2016 CLINICAL DATA:  Patient normally resides at a skilled nursing facility and was a past medical history of multiple sclerosis was admitted on 9/26 with altered mental status and septic shock. It was felt that her presentation was due to pelvic abscess versus UTI versus decubitus ulcers. General surgery was consulted. CT scan was repeated, which suggested that the abscess abuts the urinary bladder. So urology was consulted as well. Patient underwent cystoscopy and biopsies of the lesion identified in the urinary bladder. EXAM: CT ABDOMEN AND PELVIS WITHOUT CONTRAST TECHNIQUE: Multidetector CT imaging of the abdomen and pelvis was performed following the standard protocol without IV contrast. COMPARISON:  03/15/2016 and 03/10/2016. FINDINGS: Lower chest: Moderate right and small left pleural effusions. Dependent lower lobe opacity is noted consistent with atelectasis. Trace pericardial effusion. No evidence of pulmonary edema. Hepatobiliary: No focal liver abnormality is seen. No gallstones, gallbladder wall thickening, or biliary dilatation. Pancreas: Unremarkable. No pancreatic ductal dilatation or surrounding inflammatory changes. Spleen: Normal in size without focal abnormality. Adrenals/Urinary Tract: No adrenal masses. Nephrostomy tube curls within the left renal pelvis. There is mild left hydronephrosis. Bilobed 5.6 cm left midpole renal cyst is stable from prior exams. No other renal masses. There are intrarenal stones in the renal pelvis largest measuring 2.4 cm. There is a stone in the left this is stable from the prior exams. Distal ureter which measures 2 cm in  size. Mild right hydronephrosis. No right intrarenal stones. Probable small cyst along the posterior margin of the right kidney, stable. No right ureteral stone. Bladder is decompressed with a Foley catheter. A single small bubble of air projects along the peripheral margin of the left anterior bladder wall. Focal inflammatory change seen contiguous with the anterior superior bladder, which may reflect a poorly defined abscess, similar when compared to the most recent prior exam. Stomach/Bowel: There is wall thickening of the cecum and, to lesser degree, the terminal ileum, with inflammatory type change extending from the cecum towards the right anterior pelvis near the inflammatory changes that lies above the bladder. A normal appendix is visualized. No other colonic inflammatory process. Remainder of the small bowel is unremarkable. No stomach abnormality. Vascular/Lymphatic: Mild atherosclerotic calcifications  along a normal caliber abdominal aorta. Mildly enlarged right external iliac chain lymph node measuring 13 mm in short axis. There are several other prominent common and external iliac chain lymph nodes. There are prominent to borderline enlarged retroperitoneal lymph nodes most evident adjacent to the left kidney along the left margin of the aorta. Reproductive: Lobulated heterogeneous uterus consistent with multiple fibroids. No adnexal masses. Other: No abdominal wall hernia. There is subcutaneous edema most evident adjacent to the right pelvis and hip. There is a rectal type 2 that lies along the posterior margin of the perineum, posterior to the rectum and anus. Contrast has been injected, buckles along the posterior margin of the pelvis. This does not enter the rectum. Musculoskeletal: No bone resorption is seen to suggest osteomyelitis. No significant bony abnormality. IMPRESSION: 1. Ill-defined focal inflammatory change contiguous with the anterior bladder dome is similar to the prior exam. No  defined abscess. 2. There is also a right lower quadrant inflammation extending from the thick walled cecum. This may be the source of the inflammation. A normal appendix is noted. 3. 2 cm stone in the distal left ureter, stable from the most recent prior exam. Well-positioned left nephrostomy tube. Mild residual left hydronephrosis. 4. Mild right hydronephrosis likely due to the right lower quadrant inflammation or possibly X transit compression by the uterus, which is mildly enlarged by apparent fibroids. The mild hydronephrosis is similar to the prior exam. 5. Moderate right and small left pleural effusions with dependent lower lobe atelectasis, increased when compared to the CT dated 03/10/2016. Electronically Signed   By: Lajean Manes M.D.   On: 03/19/2016 16:40   Ct Abdomen Pelvis Wo Contrast  Result Date: 03/08/2016 CLINICAL DATA:  55 y/o  F; altered mental status and lethargy. EXAM: CT ABDOMEN AND PELVIS WITHOUT CONTRAST TECHNIQUE: Multidetector CT imaging of the abdomen and pelvis was performed following the standard protocol without IV contrast. COMPARISON:  12/22/2015 CT abdomen and pelvis. FINDINGS: Lower chest: Minor bibasilar atelectasis. Trace right pleural effusion. Hepatobiliary: No focal liver abnormality. Gallstones. No intra or extrahepatic biliary ductal dilatation. Pancreas: Unremarkable. No pancreatic ductal dilatation or surrounding inflammatory changes. Spleen: Moderately atrophic. Adrenals/Urinary Tract: Normal adrenal glands. Large stone in the left renal pelvis and additional small caliceal stones. Unchanged stone within the left distal ureter. Left-sided interpolar pelvic pelvic cysts. Left-sided percutaneous nephrostomy tube coiling in the pelvis. Mild right-sided stable in in name in pelvicaliectasis. No hydroureter. No right-sided obstructing stone or lesion identified. Collapsed bladder around Foley catheter. Several small diverticulum. Diffuse thickening of the bladder wall.  There is a fluid collection superior to the bladder abutting a loop of ileum in the right lower quadrant measuring 38 x 19 x 27 mm (AP x ML x CC), series 3, image 79 and series 4, image 52. The collection appears contiguous with additional areas of fluid anterior to the cecum (series 3, image 77). Additionally, there is an increase in inflammatory changes, small bowel wall thickening, and thickening of the wall of the cecum with fluid tracking along the right pericolic gutter. In the right lower quadrant in comparison with the prior CT of the abdomen and pelvis. Stomach/Bowel: Wall thickening of cecum and small bowel loops in the right lower quadrant. Interval diminution in small bowel dilatation. Normal appendix. Vascular/Lymphatic: Aortic atherosclerosis. Prominent retroperitoneal lymph nodes are probably reactive. Reproductive: Stable myomatous uterus.  No adnexal mass identified. Other: No abdominal wall hernia or abnormality. No abdominopelvic ascites. Musculoskeletal: No acute or significant osseous findings.  IMPRESSION: Worsening inflammatory changes in the pelvis and right lower quadrant with interval development of peritoneal fluid collections probably representing abscess/phlegmon, and worsening inflammatory wall thickening of cecum, right lower quadrant small bowel loops, and the dome of the bladder. The abscess/phlegmon appears associated with the dome of the bladder and probably is related to urinary tract infection. Electronically Signed   By: Kristine Garbe M.D.   On: 03/08/2016 18:19   Dg Chest 1 View  Result Date: 03/08/2016 CLINICAL DATA:  55 y/o  F; central line placement. EXAM: CHEST 1 VIEW COMPARISON:  03/08/2016 chest radiograph. FINDINGS: Left central line placement with tip projecting over lower SVC. Stable cardiomediastinal silhouette within normal limits. Low lung volumes. Clear lungs. No pneumothorax or pleural effusion. No acute osseous abnormality identified. IMPRESSION: No  active disease. Central venous catheter with tip projecting over lower SVC. Electronically Signed   By: Kristine Garbe M.D.   On: 03/08/2016 17:45   Ct Pelvis Wo Contrast  Result Date: 03/15/2016 CLINICAL DATA:  Anterior pelvic fluid collection concerning for abscess lower abdominal tenderness. Concern for fistula to the bladder. EXAM: CT PELVIS WITHOUT CONTRAST TECHNIQUE: Multidetector CT imaging of the pelvis was performed following the standard protocol without intravenous contrast. Contrast was instilled into the bladder for a ten-day ACT in cystogram to evaluate for fistula. COMPARISON:  03/10/2016, 03/08/2016 FINDINGS: Urinary Tract: Noncontrast imaging performed of the pelvis followed by installation of contrast through the existing Foley catheter at 50 and 200 cc volume. Repeat imaging performed through the bladder. Upon instilling contrast within the bladder, there is extravasation of contrast outside the bladder lumen along the bladder dome which communicates with the small measured 3.2 cm fluid collection. No definite contrast demonstrated within the adjacent bowel. Postvoid imaging also performed and the extraluminal contrast persist however does not definitively communicate with a bowel lumen. Appearance is compatible with extraperitoneal bladder injury/rupture with a small adjacent fluid collection representing a urinoma rather than a true abscess. Difficult to completely exclude fistula to bowel. Consider further evaluation with dedicated fluoroscopic cystogram. Additionally, there are numerous small bladder diverticula and thickening of the bladder wall. Bowel: Negative for bowel obstruction. Scattered colonic diverticulosis. Normal appearing appendix. Limited assessment of pelvis without IV or bowel contrast. Vascular/Lymphatic: Iliac atherosclerosis noted. No aneurysm or retroperitoneal hemorrhage. Reproductive: Lobulated appearing uterus compatible with small fibroids. No adnexal  mass. Other: No inguinal abnormality or hernia. Lower abdominal wall appears intact. Diffuse body wall anasarca noted. Musculoskeletal: No significant or acute osseous finding. IMPRESSION: CT cystogram with a total volume of 200 cc demonstrates extra luminal leakage of contrast along the bladder dome which communicates with the measured 3.2 cm pelvic fluid collection. No definite contrast communication to adjacent bowel. Appearance is compatible with extraperitoneal bladder injury/ rupture and a small adjacent urinoma. Difficult to completely exclude fistula. Consider additional evaluation with fluoroscopic cystogram. Thickened bladder wall with numerous diverticula. Electronically Signed   By: Jerilynn Mages.  Shick M.D.   On: 03/15/2016 13:44   Ct Abdomen Pelvis W Contrast  Result Date: 03/10/2016 CLINICAL DATA:  55 year old female inpatient with multiple sclerosis admitted with sepsis and draining decubitus ulcers, with suggestion of pelvic abscess on CT study from 2 days prior, presenting for follow-up EXAM: CT ABDOMEN AND PELVIS WITH CONTRAST TECHNIQUE: Multidetector CT imaging of the abdomen and pelvis was performed using the standard protocol following bolus administration of intravenous contrast. CONTRAST:  125mL ISOVUE-300 IOPAMIDOL (ISOVUE-300) INJECTION 61% COMPARISON:  03/08/2016 CT abdomen/ pelvis. FINDINGS: Lower chest: Right middle lobe  3 mm solid pulmonary nodule (series 6/image 3), stable from 03/08/2016. New small layering bilateral pleural effusions with associated mild compressive atelectasis in the dependent lower lobes. Stable small posterior pericardial effusion/thickening. Hepatobiliary: Normal liver size. Two subcentimeter hypodense liver lesions are too small to characterize and stable. No new liver lesions. Normal gallbladder with no radiopaque cholelithiasis. No biliary ductal dilatation. Pancreas: Normal, with no mass or duct dilation. Spleen: Normal size. No mass. Adrenals/Urinary Tract: Mild  diffuse adrenal thickening bilaterally, unchanged, with no discrete adrenal nodules. Mild right hydroureteronephrosis is mildly decreased in the interval. Stable 10 mm stone in the left pelvic ureter approximately 4 cm above the left UVJ. Stable position of left percutaneous nephrostomy tube with the pigtail tip in the left renal pelvis. No residual left hydronephrosis. Two adjacent stones in the left renal pelvis measuring 16 mm in 15 mm. There is diffuse urothelial wall thickening throughout the bilateral renal collecting systems and bilateral ureters, not appreciably changed. There is symmetric bilateral perinephric fat stranding. Subcentimeter hypodense renal cortical lesions in the posterior lower right kidney and mid to upper left kidney are too small to characterize and unchanged. Simple 5.6 cm renal cyst in the lower left kidney. No focal drainable perinephric fluid collections. Bladder is collapsed by indwelling Foley catheter. Gas within the bladder lumen and upper left renal collecting system is consistent with instrumentation. Probable persistent diffuse bladder wall thickening. Stomach/Bowel: Grossly normal stomach. Normal caliber small bowel with no small bowel wall thickening. Normal appendix. Stable mild wall thickening in the cecum. Otherwise unremarkable large bowel, with no additional sites of large bowel wall thickening. Oral contrast progresses to the rectum. Vascular/Lymphatic: Atherosclerotic nonaneurysmal abdominal aorta. Patent portal, splenic, hepatic and renal veins. Stable mildly enlarged left para-aortic lymph nodes measuring up to 1.0 cm (series 2/ image 43). Reproductive: Stable enlarged myomatous uterus.  No adnexal mass. Other: No free intraperitoneal air. There is an irregular right anterior pelvic fluid collection with thick enhancing wall measuring 9.5 x 3.8 x 2.9 cm (series 5/ image 11), which is intimately associated with the right superior bladder wall and abuts the cecum and  right pelvic small bowel loops, and which measured 8.4 x 3.7 x 2.7 cm on the 03/08/2016, mildly increased. Trace pelvic cul-de-sac and perihepatic ascites, minimally increased. Mild anasarca, increased. Stable bilateral ischial decubitus ulcers with soft tissue extending from the skin surface to the ischial periosteum without discrete bone erosions. Stable sacral decubitus ulcer with soft tissue extending from the skin surface to the lower left sacral periosteum without discrete sacral erosion. No superficial fluid collections. Musculoskeletal: No aggressive appearing focal osseous lesions. Minimal thoracolumbar spondylosis. IMPRESSION: 1. Irregular right anterior pelvic abscess has mildly increased in size. This collection is intimately associated with the right upper bladder wall, cecum and right lower quadrant pelvic small bowel loops. No free air. Appendix appears normal. 2. Persistent urothelial wall thickening throughout the bilateral urinary tracts suggesting bilateral ascending urinary tract infection. Mild right hydroureteronephrosis is decreased and left hydronephrosis is resolved status post Foley catheter placement. Stable left renal pelvis and left pelvic ureteral stones. 3. Increased findings of third-spacing including small increased bilateral pleural effusions, mild anasarca and trace ascites. 4. Stable bilateral ischial and lower sacral decubitus ulcers. 5. Additional findings include enlarged myomatous uterus, stable mild left para-aortic adenopathy (nonspecific and likely reactive) and mild adrenal hyperplasia. Electronically Signed   By: Ilona Sorrel M.D.   On: 03/10/2016 16:19   Dg Chest Port 1 View  Result Date: 03/19/2016  CLINICAL DATA:  Pulmonary edema.  Hypertension. EXAM: PORTABLE CHEST 1 VIEW COMPARISON:  March 17, 2016 FINDINGS: New right central there has been resolution of edema compared to recent study. There are small pleural effusions bilaterally. There remains cardiomegaly. The  pulmonary vascularity is normal. No adenopathy. No bone lesions. Catheter tip is in the right atrium with the tip approximately 4 cm beyond the cavoatrial junction. No pneumothorax. IMPRESSION: Central catheter tip in right atrium. No pneumothorax. Small pleural effusions and cardiomegaly. Edema has resolved. No new opacity. Cardiac silhouette is stable. Electronically Signed   By: Lowella Grip III M.D.   On: 03/19/2016 07:12   Dg Chest Port 1 View  Result Date: 03/17/2016 CLINICAL DATA:  Cough. EXAM: PORTABLE CHEST 1 VIEW COMPARISON:  Radiograph of March 10, 2016. FINDINGS: Stable cardiomediastinal silhouette. Left internal jugular catheter is unchanged with distal tip in expected position of the SVC. No pneumothorax is noted. Increased bilateral perihilar and basilar interstitial densities are noted concerning for edema. Stable mild left pleural effusion is noted. Bony thorax is unremarkable. IMPRESSION: Bilateral perihilar and basilar edema is noted. Stable mild left pleural effusion. Electronically Signed   By: Marijo Conception, M.D.   On: 03/17/2016 09:43   Dg Chest Port 1 View  Result Date: 03/10/2016 CLINICAL DATA:  Aspiration. EXAM: PORTABLE CHEST 1 VIEW COMPARISON:  03/08/2016. FINDINGS: Left IJ line in stable position. Mediastinum hilar structures are stable. Heart size stable. Low lung volumes with mild bibasilar atelectasis and/or infiltrates. Small left pleural effusion cannot be excluded. No pneumothorax. Drainage catheter noted over left upper abdomen. IMPRESSION: 1. Left IJ line in stable position. 2. Low lung volumes with mild bibasilar atelectasis and/or infiltrates. Small left pleural effusion cannot be excluded. Electronically Signed   By: Marcello Moores  Register   On: 03/10/2016 07:15   Dg Chest Port 1 View  Result Date: 03/08/2016 CLINICAL DATA:  Fever, abdominal pain, tachycardia EXAM: PORTABLE CHEST 1 VIEW COMPARISON:  Portable chest x-ray of 12/30/2015 FINDINGS: No active  infiltrate or effusion is seen. Mediastinal and hilar contours are unremarkable. The heart is within normal limits in size. No bony abnormality is seen. IMPRESSION: No active disease. Electronically Signed   By: Ivar Drape M.D.   On: 03/08/2016 13:25   Ir Nephrostomy Exchange Left  Result Date: 02/23/2016 INDICATION: History of urosepsis, post ultrasound fluoroscopic guided left-sided percutaneous nephrostomy catheter placement on 12/25/2015. Patient presents today for routine fluoroscopic guided percutaneous nephrostomy catheter exchange. EXAM: FLUOROSCOPIC GUIDED LEFT SIDED NEPHROSTOMY CATHETER EXCHANGE COMPARISON:  CT scan the abdomen pelvis - 12/22/2015; ultrasound fluoroscopic guided left-sided percutaneous nephrostomy catheter placement - 12/25/2015 CONTRAST:  8 mL Isovue-300 administered into the collecting system FLUOROSCOPY TIME:  1 minute 48 seconds (24 mGy) COMPLICATIONS: None immediate. TECHNIQUE: Informed written consent was obtained from the patient after a discussion of the risks, benefits and alternatives to treatment. Questions regarding the procedure were encouraged and answered. A timeout was performed prior to the initiation of the procedure. The left flank and external portion of existing nephrostomy catheter were prepped and draped in the usual sterile fashion. A sterile drape was applied covering the operative field. Maximum barrier sterile technique with sterile gowns and gloves were used for the procedure. A timeout was performed prior to the initiation of the procedure. A pre procedural spot fluoroscopic image was obtained after contrast was injected via the existing nephrostomy catheter demonstrating appropriate positioning within the renal pelvis. The existing nephrostomy catheter was cut and cannulated with a Britta Mccreedy wire  which was coiled within the renal pelvis. Under intermittent fluoroscopic guidance, the existing nephrostomy catheter was exchanged for a new 10.2 [place]  all-purpose drainage catheter. Contrast injection confirmed appropriate positioning within the renal pelvis and a post exchange fluoroscopic image was obtained. The catheter was locked and secured to the skin with a suture. A dressing was placed. The patient tolerated the procedure well without immediate postprocedural complication. FINDINGS: The existing nephrostomy catheter is appropriately positioned and functioning. Grossly unchanged positioning of known pelvic stones about the central aspect of the nephrostomy coil. After successful fluoroscopic guided exchange, the new nephrostomy catheter is coiled and locked within the left renal pelvis. IMPRESSION: Successful fluoroscopic guided exchange of left sided 10.2 French percutaneous nephrostomy catheter. Electronically Signed   By: Sandi Mariscal M.D.   On: 02/23/2016 17:28    Assessment/Plan  Physical deconditioning Will have her work with physical therapy and occupational therapy team to help with gait training and muscle strengthening exercises.fall precautions. Skin care. Encourage to be out of bed.   Pelvic abscess Continue and complete course of cefepime for 2 weeks. Monitor wbc and temp curve.   Pseudomonas UTI Continue and complete course of cefepime until 04/03/16. Hydration and foley catheter care to be maintained. Continue picc line care  Leukocytosis Currently on antibiotic for UTI and pelvic abscess, monitor wbc and temp curve  Dm type 2 with neuropathy Monitor cbg, continue glipizide 5 mg bid and metformin 500 mg bid. Continue neurontin 100 mg qhs Lab Results  Component Value Date   HGBA1C 6.0 (H) 12/18/2015    Chronic lymphedema Stable, continue furosemide 40 mg bid and monitor  Protein calorie malnutrition Continue protein supplement and multivitamin  HTN Stable bp, monitor, continue lisinopril 2.5 mg daily, check bmp  Neurogenic bladder Continue vesicare and foley catheter with foley care  Left nephrolithiasis S/p  left nephrostomy tube, to follow up with urology team  Anemia of chronic disease Monitor h&h  Left leg DVT Continue xarelto and monitor  Hyperlipidemia Continue atorvastatin 10 mg daily  Chronic constipation Continue miralax and senna s  Multiple sclerosis Continue baclofen 10 mg tid, continue supportive care  gerd Stable, continue pepcid 20 mg bid  ckd stage 3 Monitor bmp    Goals of care: short term rehabilitation   Labs/tests ordered: cbc, cmp  Family/ staff Communication: reviewed care plan with patient and nursing supervisor    Blanchie Serve, MD Internal Medicine [facility] [address] [place], [place] [address] Cell Phone (Monday-Friday 8 am - 5 pm): [phone] On Call: [phone] and follow prompts after 5 pm and on weekends Office Phone: [phone] Office Fax: [phone]

## 2016-03-24 LAB — HEPATIC FUNCTION PANEL
ALT: 11 U/L (ref 7–35)
AST: 10 U/L — AB (ref 13–35)
Alkaline Phosphatase: 66 U/L (ref 25–125)
Bilirubin, Total: 0.3 mg/dL

## 2016-03-24 LAB — CBC AND DIFFERENTIAL
HCT: 31 % — AB (ref 36–46)
HEMOGLOBIN: 9.4 g/dL — AB (ref 12.0–16.0)
Platelets: 563 10*3/uL — AB (ref 150–399)
WBC: 9.8 10^3/mL

## 2016-03-24 LAB — BASIC METABOLIC PANEL
BUN: 27 mg/dL — AB (ref 4–21)
CREATININE: 0.6 mg/dL (ref 0.5–1.1)
GLUCOSE: 103 mg/dL
POTASSIUM: 4.1 mmol/L (ref 3.4–5.3)
SODIUM: 142 mmol/L (ref 137–147)

## 2016-03-28 ENCOUNTER — Other Ambulatory Visit: Payer: Self-pay | Admitting: Urology

## 2016-03-28 DIAGNOSIS — N200 Calculus of kidney: Secondary | ICD-10-CM

## 2016-03-30 ENCOUNTER — Telehealth (HOSPITAL_BASED_OUTPATIENT_CLINIC_OR_DEPARTMENT_OTHER): Payer: Self-pay | Admitting: Emergency Medicine

## 2016-03-30 NOTE — Telephone Encounter (Signed)
Lost to followup 

## 2016-04-19 ENCOUNTER — Encounter (HOSPITAL_COMMUNITY): Payer: Self-pay | Admitting: Interventional Radiology

## 2016-04-19 ENCOUNTER — Ambulatory Visit (HOSPITAL_COMMUNITY)
Admission: RE | Admit: 2016-04-19 | Discharge: 2016-04-19 | Disposition: A | Discharge status: Home / Self Care | Payer: Medicare Other | Source: Ambulatory Visit | Attending: Interventional Radiology | Admitting: Interventional Radiology

## 2016-04-19 ENCOUNTER — Ambulatory Visit (HOSPITAL_COMMUNITY)
Admission: RE | Admit: 2016-04-19 | Discharge: 2016-04-19 | Disposition: A | Discharge status: Home / Self Care | Payer: Medicare Other | Source: Ambulatory Visit | Attending: Urology | Admitting: Urology

## 2016-04-19 ENCOUNTER — Other Ambulatory Visit (HOSPITAL_COMMUNITY): Payer: Self-pay | Admitting: Interventional Radiology

## 2016-04-19 DIAGNOSIS — Z436 Encounter for attention to other artificial openings of urinary tract: Secondary | ICD-10-CM | POA: Diagnosis not present

## 2016-04-19 DIAGNOSIS — N131 Hydronephrosis with ureteral stricture, not elsewhere classified: Secondary | ICD-10-CM

## 2016-04-19 DIAGNOSIS — N200 Calculus of kidney: Secondary | ICD-10-CM

## 2016-04-19 DIAGNOSIS — N202 Calculus of kidney with calculus of ureter: Secondary | ICD-10-CM | POA: Insufficient documentation

## 2016-04-19 HISTORY — PX: IR GENERIC HISTORICAL: IMG1180011

## 2016-04-19 MED ORDER — IOPAMIDOL (ISOVUE-300) INJECTION 61%
30.0000 mL | Freq: Once | INTRAVENOUS | Status: AC | PRN
  Administered 2016-04-19: 30 mL via ORAL

## 2016-04-19 MED ORDER — IOPAMIDOL (ISOVUE-300) INJECTION 61%
50.0000 mL | Freq: Once | INTRAVENOUS | Status: AC | PRN
  Administered 2016-04-19: 5 mL via INTRAVENOUS

## 2016-04-19 MED ORDER — IOPAMIDOL (ISOVUE-300) INJECTION 61%
100.0000 mL | Freq: Once | INTRAVENOUS | Status: AC | PRN
  Administered 2016-04-19: 100 mL via INTRAVENOUS

## 2016-04-19 MED ORDER — LIDOCAINE HCL 1 % IJ SOLN
INTRAMUSCULAR | Status: AC
  Filled 2016-04-19: qty 20

## 2016-04-19 MED ORDER — IOPAMIDOL (ISOVUE-300) INJECTION 61%: 30.0000 mL | Freq: Once | INTRAVENOUS | Status: DC | PRN

## 2016-04-19 MED ORDER — LIDOCAINE HCL 1 % IJ SOLN
INTRAMUSCULAR | Status: DC | PRN
  Administered 2016-04-19: 5 mL

## 2016-04-22 ENCOUNTER — Non-Acute Institutional Stay (SKILLED_NURSING_FACILITY): Payer: Medicare Other | Admitting: Family

## 2016-04-22 DIAGNOSIS — Z960 Presence of urogenital implants: Secondary | ICD-10-CM

## 2016-04-22 DIAGNOSIS — G35 Multiple sclerosis: Secondary | ICD-10-CM | POA: Diagnosis not present

## 2016-04-22 DIAGNOSIS — E118 Type 2 diabetes mellitus with unspecified complications: Secondary | ICD-10-CM | POA: Diagnosis not present

## 2016-04-22 DIAGNOSIS — M792 Neuralgia and neuritis, unspecified: Secondary | ICD-10-CM

## 2016-04-22 DIAGNOSIS — Z9289 Personal history of other medical treatment: Secondary | ICD-10-CM

## 2016-04-22 DIAGNOSIS — Z978 Presence of other specified devices: Secondary | ICD-10-CM

## 2016-04-22 DIAGNOSIS — L89109 Pressure ulcer of unspecified part of back, unspecified stage: Secondary | ICD-10-CM | POA: Diagnosis not present

## 2016-04-22 MED ORDER — METFORMIN HCL 500 MG PO TABS: 250.0000 mg | ORAL_TABLET | Freq: Two times a day (BID) | ORAL | Status: DC

## 2016-04-22 NOTE — Progress Notes (Signed)
Location:  [facility] Room Number: [place] of Service:  SNF [id number]) Provider:  Marlowe Sax FNP-C    Extended Emergency Contact Information Primary Emergency Contact: [facility] Address: [address]          [place], [place] [address] Johnnette Litter of [place] Phone: [phone] Mobile Phone: [phone] Relation: Relative Secondary Emergency Contact: Hughes,Michael Address: [address]          [place], [place] [address] Johnnette Litter of [place] Phone: [phone] Mobile Phone: [phone] Relation: Relative  Code Status:  Full Code  Goals of care: Advanced Directive information Advanced Directives 03/08/2016  Does patient have an advance directive? Yes  Type of Advance Directive [place]  Does patient want to make changes to advanced directive? No - Patient declined  Copy of advanced directive(s) in chart? Yes  Would patient like information on creating an advanced directive? -  Pre-existing out of facility DNR order (yellow form or pink MOST form) -     Chief Complaint  Patient presents with  . Medical Management of Chronic Issues    HPI:  Pt is a 55 y.o. female seen today at [facility] for medical management of chronic diseases.She has a medical history of MS, Type 2 DM, CKD stage 3, Neuropathic pain , Neurogenic bladder among other conditions. She is seen in her room today. She denies any acute issues this visit. She has been getting OOB to power Wheelchair states went around facility hallway and enjoys participating in activities. Facility Nurse reported that patient's indwelling foley catheter and left side nephrostomy tube draining had reduced amounts of urine but patient was encouraged to drink fluid and urine amounts increased. Sacral and right gluteal wound not visualized this visit patient already on power wheelchair and not willing to get back to bed now. Will  visualize during drsg changes.Wound Nurse reports no new concerns.       Past Medical History:  Diagnosis Date  . Acute renal failure superimposed on stage 3 chronic kidney disease ([place]) 07/21/2015  . Anemia    chronic   . Diabetes mellitus without complication ([place])   . Diabetic neuropathy ([place])   . DVT (deep venous thrombosis) ([place])   . DVT (deep venous thrombosis) ([place])   . GERD (gastroesophageal reflux disease)   . Hypertension   . Left nephrolithiasis   . Leukocytosis   . Lymph edema    chronic   . Malnutrition of moderate degree 07/21/2015  . Multiple sclerosis diagnosed 2002-not on Therapy any longer 06/26/2012  . Muscle weakness (generalized)   . Neuromuscular disorder ([place])   . Neuromuscular dysfunction of bladder   . Polyneuropathy ([place])   . Presence of indwelling urinary catheter   . Protein calorie malnutrition ([place])   . Stage 4 decubitus ulcer ([place]) 07/05/2015  . Stroke ([place])   . UTI (lower urinary tract infection)    Past Surgical History:  Procedure Laterality Date  . ESOPHAGOGASTRODUODENOSCOPY Left 01/02/2015   Procedure: ESOPHAGOGASTRODUODENOSCOPY (EGD);  Surgeon: Arta Silence, MD;  Location: [facility];  Service: Endoscopy;  Laterality: Left;  . HERNIA MESH REMOVAL    . IR GENERIC HISTORICAL  02/23/2016   IR NEPHROSTOMY EXCHANGE LEFT 02/23/2016 Sandi Mariscal, MD [facility] RAD  . IR GENERIC HISTORICAL  04/19/2016   IR NEPHROSTOMY EXCHANGE LEFT 04/19/2016 Arne Cleveland, MD [facility]  . NEPHROSTOMY TUBE PLACEMENT ([place] HX)    . TRANSURETHRAL RESECTION OF  BLADDER TUMOR N/A 03/16/2016   Procedure: CYSTOSCOPY BLADDER BIOPSY;  Surgeon: Franchot Gallo, MD;  Location: [facility];  Service: Urology;  Laterality: N/A;  . TUBAL LIGATION    . tubes tided      No Known Allergies    Medication List       Accurate as of 04/22/16  7:08 PM. Always use your most recent med list.          acetaminophen 325 MG tablet Commonly known as:  TYLENOL Take 2 tablets (650 mg  total) by mouth every 6 (six) hours as needed for fever.   albuterol (2.5 MG/3ML) 0.083% nebulizer solution Commonly known as:  PROVENTIL Take 3 mLs (2.5 mg total) by nebulization every 6 (six) hours as needed for wheezing or shortness of breath.   atorvastatin 10 MG tablet Commonly known as:  LIPITOR Take 10 mg by mouth at bedtime.   baclofen 10 MG tablet Commonly known as:  LIORESAL Take 1 tablet (10 mg total) by mouth 3 (three) times daily.   famotidine 20 MG tablet Commonly known as:  PEPCID Take 1 tablet (20 mg total) by mouth 2 (two) times daily.   feeding supplement (PRO-STAT SUGAR FREE 64) Liqd Take 30 mLs by mouth 2 (two) times daily.   furosemide 20 MG tablet Commonly known as:  LASIX Take 40 mg by mouth 2 (two) times daily.   gabapentin 100 MG capsule Commonly known as:  NEURONTIN Take 1 capsule (100 mg total) by mouth at bedtime.   glipiZIDE 5 MG tablet Commonly known as:  GLUCOTROL Take 5 mg by mouth 2 (two) times daily.   lisinopril 2.5 MG tablet Commonly known as:  PRINIVIL,ZESTRIL Take 2.5 mg by mouth daily.   metFORMIN 500 MG tablet Commonly known as:  GLUCOPHAGE Take 0.5 tablets (250 mg total) by mouth 2 (two) times daily.   multivitamin with minerals Tabs tablet Take 1 tablet by mouth daily.   Omega 3 1000 MG Caps Take 1,000 mg by mouth daily.   polyethylene glycol packet Commonly known as:  MIRALAX / GLYCOLAX Take 17 g by mouth 2 (two) times daily.   potassium chloride 10 MEQ tablet Commonly known as:  K-DUR,KLOR-CON Take 1 tablet (10 mEq total) by mouth daily.   rivaroxaban 20 MG Tabs tablet Commonly known as:  XARELTO Take 1 tablet (20 mg total) by mouth daily with supper. Reported on 08/12/2015   senna-docusate 8.6-50 MG tablet Commonly known as:  Senokot-S Take 1 tablet by mouth 2 (two) times daily.   solifenacin 5 MG tablet Commonly known as:  VESICARE Take 1 tablet (5 mg total) by mouth daily.       Review of Systems    Constitutional: Negative for activity change, appetite change, chills, fatigue and fever.  HENT: Negative for congestion, rhinorrhea, sinus pressure, sneezing and sore throat.   Eyes: Negative.   Respiratory: Negative for cough, chest tightness, shortness of breath and wheezing.   Cardiovascular: Negative for chest pain, palpitations and leg swelling.  Gastrointestinal: Negative for abdominal distention, abdominal pain, constipation, diarrhea, nausea and vomiting.  Endocrine: Negative for cold intolerance, heat intolerance, polydipsia, polyphagia and polyuria.  Genitourinary: Negative for urgency.       Foley Catheter and Left Nephrostomy   Musculoskeletal: Positive for gait problem.  Skin: Negative for color change, pallor, rash and wound.  Neurological: Negative for dizziness, seizures, syncope, light-headedness, numbness and headaches.  Psychiatric/Behavioral: Negative for agitation, confusion, hallucinations and sleep disturbance. The patient is not nervous/anxious.  Immunization History  Administered Date(s) Administered  . Influenza Split 06/26/2012  . Influenza-Unspecified 04/02/2015  . PPD Test 01/04/2015, 07/09/2015, 07/23/2015, 09/02/2015, 12/31/2015  . Pneumococcal-Unspecified 03/13/2014   Pertinent  Health Maintenance Due  Topic Date Due  . FOOT EXAM  10/19/1968  . OPHTHALMOLOGY EXAM  10/19/1968  . PAP SMEAR  10/20/1979  . COLONOSCOPY  10/19/2008  . MAMMOGRAM  09/19/2015  . INFLUENZA VACCINE  08/01/2016 (Originally 01/12/2016)  . HEMOGLOBIN A1C  06/19/2016      Vitals:   04/22/16 1500  BP: (!) 100/55  Pulse: 81  Resp: 18  Temp: 98 F (36.7 C)  SpO2: 96%  Weight: 96 lb 1.6 oz (43.6 kg)  Height: 5\' 6"  (1.676 m)   Body mass index is 15.51 kg/m. Physical Exam  Constitutional: She is oriented to person, place, and time.  Vibrant up on the power wheelchair   HENT:  Head: Normocephalic.  Mouth/Throat: Oropharynx is clear and moist. No oropharyngeal exudate.   Eyes: Conjunctivae and EOM are normal. Pupils are equal, round, and reactive to light. Right eye exhibits no discharge. Left eye exhibits no discharge. No scleral icterus.  Neck: Normal range of motion. No JVD present. No thyromegaly present.  Cardiovascular: Normal rate, regular rhythm, normal heart sounds and intact distal pulses.  Exam reveals no gallop and no friction rub.   No murmur heard. Pulmonary/Chest: Effort normal and breath sounds normal. No respiratory distress. She has no wheezes. She has no rales.  Abdominal: Soft. Bowel sounds are normal. She exhibits no distension. There is no rebound.  Genitourinary:  Genitourinary Comments: Foley cathter and Left Nephrostomy draining amber color urine.   Musculoskeletal: She exhibits no edema, tenderness or deformity.  RUE /Bilateral LE's weakness   Lymphadenopathy:    She has no cervical adenopathy.  Neurological: She is oriented to person, place, and time.  Skin: Skin is warm and dry. No rash noted. No erythema. No pallor.  Sacral and right gluteal wound not visualized this visit patient on Power wheelchair and not willing to get back to bed.  Bilateral Lower extremity skin hyperpigmentation.    Psychiatric: She has a normal mood and affect.    Labs reviewed:  Recent Labs  11/17/15 0709  03/09/16 0305  03/11/16 0406  03/12/16 1020  03/13/16 1500  03/16/16 0416 03/17/16 0425 03/18/16 0425 03/19/16 0450  NA 140  < > 141  < > 142  < >  --   < >  --   < > 144 142 143 141  K 3.7  < > 3.0*  < > 3.6  < >  --   < >  --   < > 3.0* 3.6 3.1* 3.5  CL 110  < > 111  < > 119*  < >  --   < >  --   < > 116* 114* 110 107  CO2 25  < > 22  < > 19*  < >  --   < >  --   < > 25 26 29 31   GLUCOSE 93  < > 131*  < > 76  < >  --   < >  --   < > 109* 96 90 101*  BUN 13  < > 38*  < > 9  < >  --   < >  --   < > 6 5* 5* 8  CREATININE 0.54  < > 1.18*  < > 0.52  < >  --   < >  --   < >  0.47 0.37* 0.42* 0.40*  CALCIUM 9.5  < > 9.2  < > 9.0  < >  --   < >   --   < > 8.8* 8.6* 8.9 9.3  MG 1.8  --  1.8  < > 1.6*  --  1.4*  --  1.9  --  1.5*  --   --   --   PHOS 3.2  --  2.8  --  2.0*  --   --   --   --   --   --   --   --   --   < > = values in this interval not displayed.  Recent Labs  12/18/15 1035 12/19/15 0744 01/06/16 03/08/16 1240  AST 15 12* 12* 21  ALT 8* 7* 17 13*  ALKPHOS 40 50 67 40  BILITOT 0.7 0.5  --  0.6  PROT 6.6 6.6  --  7.7  ALBUMIN 2.2* 2.3*  --  3.2*    Recent Labs  12/29/15 1355  02/08/16 1724  03/10/16 1340  03/17/16 0425 03/18/16 0425 03/19/16 0450  WBC 16.2*  < > 13.3*  < > 17.3*  < > 13.4* 12.3* 12.4*  NEUTROABS 11.4*  --  7.3  --  14.2*  --   --   --   --   HGB 7.0*  < > 8.4*  < > 7.2*  < > 7.8* 7.9* 8.0*  HCT 23.0*  < > 25.5*  < > 21.0*  < > 23.4* 24.2* 25.2*  MCV 85.5  < > 89.5  < > 86.4  < > 89.3 90.3 90.6  PLT 809*  < > 412*  < > 263  < > 443* 507* 526*  < > = values in this interval not displayed. Lab Results  Component Value Date   TSH 1.787 07/21/2015   Lab Results  Component Value Date   HGBA1C 6.0 (H) 12/18/2015   Lab Results  Component Value Date   CHOL 143 07/13/2015   HDL 41 07/13/2015   LDLCALC 88 07/13/2015   TRIG 71 07/13/2015    Assessment/Plan 1. Controlled type 2 diabetes mellitus with complication, without long-term current use of insulin (HCC) CBG's in the morning ranging in the 70's-90's; afternoon 70's -150's. Will reduce  Metformin to 250 mg Tablet twice daily and continue with Glipizide 5 mg tablet twice daily. Will reduce glipizide to 2.5 mg Tablet daily or D/c if CBG's continue to be stable. Follow up in 1 week.   2. Multiple sclerosis diagnosed 2002-not on Therapy any longer Progressive decline expected. Continue to assist with ADL's. Uses power wheelchair.   3. Decubitus ulcer of back, unspecified ulcer stage Sacral and right gluteal wound not visualized this visit patient on Power wheelchair and not willing to get back to bed.continue with wound care. Will  evaluate wounds during drsg changes with wound care Nurse.   4. Neuropathic pain Continue on Gabapentin.   5. Chronic indwelling Foley catheter Continue foley care. Continue to monitor output and encourage fluid intake.     Family/ staff Communication: Reviewed plan of care with patient and facility Nurse supervisor.   Labs/tests ordered: None

## 2016-04-25 LAB — BASIC METABOLIC PANEL
BUN: 128 mg/dL — AB (ref 4–21)
CREATININE: 0.7 mg/dL (ref 0.5–1.1)
GLUCOSE: 96 mg/dL
POTASSIUM: 4.4 mmol/L (ref 3.4–5.3)
Sodium: 142 mmol/L (ref 137–147)

## 2016-04-25 LAB — CBC AND DIFFERENTIAL
HCT: 30 % — AB (ref 36–46)
HEMOGLOBIN: 9.3 g/dL — AB (ref 12.0–16.0)
PLATELETS: 385 10*3/uL (ref 150–399)
WBC: 7.9 10*3/mL

## 2016-04-26 ENCOUNTER — Other Ambulatory Visit: Payer: Self-pay | Admitting: Urology

## 2016-05-02 NOTE — Progress Notes (Addendum)
Spoke with nurse at [facility].  She is to fax demographic face sheet and most current MAR to [phone].  Nurse states patient is alert and oriented and able to sign own consent frm.  .She

## 2016-05-03 ENCOUNTER — Non-Acute Institutional Stay (SKILLED_NURSING_FACILITY): Payer: Medicare Other | Admitting: Family

## 2016-05-03 ENCOUNTER — Encounter: Payer: Self-pay | Admitting: Family

## 2016-05-03 ENCOUNTER — Encounter (HOSPITAL_COMMUNITY): Payer: Self-pay | Admitting: *Deleted

## 2016-05-03 DIAGNOSIS — E118 Type 2 diabetes mellitus with unspecified complications: Secondary | ICD-10-CM | POA: Diagnosis not present

## 2016-05-03 DIAGNOSIS — N200 Calculus of kidney: Secondary | ICD-10-CM | POA: Diagnosis not present

## 2016-05-03 DIAGNOSIS — Z95828 Presence of other vascular implants and grafts: Secondary | ICD-10-CM

## 2016-05-03 NOTE — Progress Notes (Signed)
Location:  [facility] Room Number: [phone] Place of Service:  SNF [phone]) Provider:  Marlowe Sax, NP  Blanchie Serve, MD  Patient Care Team: Blanchie Serve, MD as PCP - General (Internal Medicine)  Extended Emergency Contact Information Primary Emergency Contact: [facility] Address: [address]          [place], [place] [address] Johnnette Litter of [place] Phone: [phone] Mobile Phone: [phone] Relation: Relative Secondary Emergency Contact: Hughes,Michael Address: [address]          [place],  [address] Johnnette Litter of [place] Phone: [phone] Mobile Phone: [phone] Relation: Relative  Code Status:  Full Code  Goals of care: Advanced Directive information Advanced Directives 05/03/2016  Does Patient Have a Medical Advance Directive? No  Type of Advance Directive -  Does patient want to make changes to medical advance directive? -  Copy of [place] in Chart? -  Would patient like information on creating a medical advance directive? -  Pre-existing out of facility DNR order (yellow form or pink MOST form) -     Chief Complaint  Patient presents with  . Acute Visit    Acute F/U on Hypoglycemia    HPI:  Pt is a 55 y.o. female seen today at [facility] for an acute visit for follow up hypoglycemia. She has a significant medical history of Type 2 DM, Hyperlipidemia, MS among other conditions. She is seen in her room today. She denies any acute issues this visit. Her CBG's ranging in the 80's-150's with episodic readings in the 50's. She denies any hypoglycemia symptoms. She has a right arm PICC line currently not in use plan to discontinue. She is scheduled for nephrolithotomy on 05/12/2016.Her Xarelto will be held five days prior to surgery per Pre-op orders. She will be NPO on 05/11/2016 after midnight.Also Facility staff to obtain urine specimen for U/A and C/S one  week prior to procedure.      Past Medical History:  Diagnosis Date  . Acute renal failure superimposed on stage 3 chronic kidney disease ([place]) 07/21/2015  . Anemia    chronic   . Diabetes mellitus without complication ([place])   . Diabetic neuropathy ([place])   . DVT (deep venous thrombosis) ([place])   . DVT (deep venous thrombosis) ([place])   . GERD (gastroesophageal reflux disease)   . Hypertension   . Left nephrolithiasis   . Leukocytosis   . Lymph edema    chronic   . Malnutrition of moderate degree 07/21/2015  . Multiple sclerosis diagnosed 2002-not on Therapy any longer 06/26/2012  . Muscle weakness (generalized)   . Neuromuscular disorder ([place])   . Neuromuscular dysfunction of bladder   . Polyneuropathy ([place])   . Presence of indwelling urinary catheter   . Protein calorie malnutrition ([place])   . Stage 4 decubitus ulcer ([place]) 07/05/2015  . Stroke ([place])   . UTI (lower urinary tract infection)    Past Surgical History:  Procedure Laterality Date  . ESOPHAGOGASTRODUODENOSCOPY Left 01/02/2015   Procedure: ESOPHAGOGASTRODUODENOSCOPY (EGD);  Surgeon: Arta Silence, MD;  Location: [facility];  Service: Endoscopy;  Laterality: Left;  . HERNIA MESH REMOVAL    . IR GENERIC HISTORICAL  02/23/2016   IR NEPHROSTOMY EXCHANGE LEFT 02/23/2016 Sandi Mariscal, MD [facility]  . IR GENERIC HISTORICAL  04/19/2016   IR NEPHROSTOMY EXCHANGE LEFT 04/19/2016 Arne Cleveland, MD [facility]  . NEPHROSTOMY TUBE PLACEMENT ([place] HX)    .  TRANSURETHRAL RESECTION OF BLADDER TUMOR N/A 03/16/2016   Procedure: CYSTOSCOPY BLADDER BIOPSY;  Surgeon: Franchot Gallo, MD;  Location: [facility];  Service: Urology;  Laterality: N/A;  . TUBAL LIGATION    . tubes tided      No Known Allergies    Medication List       Accurate as of 05/03/16 10:10 AM. Always use your most recent med list.          acetaminophen 325 MG tablet Commonly known as:  TYLENOL Take 2 tablets (650 mg total) by mouth every 6 (six) hours as  needed for fever.   albuterol (2.5 MG/3ML) 0.083% nebulizer solution Commonly known as:  PROVENTIL Take 3 mLs (2.5 mg total) by nebulization every 6 (six) hours as needed for wheezing or shortness of breath.   atorvastatin 10 MG tablet Commonly known as:  LIPITOR Take 10 mg by mouth at bedtime.   baclofen 10 MG tablet Commonly known as:  LIORESAL Take 1 tablet (10 mg total) by mouth 3 (three) times daily.   famotidine 20 MG tablet Commonly known as:  PEPCID Take 1 tablet (20 mg total) by mouth 2 (two) times daily.   feeding supplement (PRO-STAT SUGAR FREE 64) Liqd Take 30 mLs by mouth 2 (two) times daily.   furosemide 20 MG tablet Commonly known as:  LASIX Take 40 mg by mouth 2 (two) times daily.   gabapentin 100 MG capsule Commonly known as:  NEURONTIN Take 1 capsule (100 mg total) by mouth at bedtime.   glipiZIDE 5 MG tablet Commonly known as:  GLUCOTROL Take 5 mg by mouth 2 (two) times daily.   lisinopril 2.5 MG tablet Commonly known as:  PRINIVIL,ZESTRIL Take 2.5 mg by mouth daily.   metFORMIN 500 MG tablet Commonly known as:  GLUCOPHAGE Take 0.5 tablets (250 mg total) by mouth 2 (two) times daily.   multivitamin with minerals Tabs tablet Take 1 tablet by mouth daily.   polyethylene glycol packet Commonly known as:  MIRALAX / GLYCOLAX Take 17 g by mouth 2 (two) times daily.   potassium chloride 10 MEQ tablet Commonly known as:  K-DUR,KLOR-CON Take 1 tablet (10 mEq total) by mouth daily.   rivaroxaban 20 MG Tabs tablet Commonly known as:  XARELTO Take 1 tablet (20 mg total) by mouth daily with supper. Reported on 08/12/2015   senna-docusate 8.6-50 MG tablet Commonly known as:  Senokot-S Take 1 tablet by mouth 2 (two) times daily.   solifenacin 5 MG tablet Commonly known as:  VESICARE Take 1 tablet (5 mg total) by mouth daily.       Review of Systems  Constitutional: Negative for activity change, appetite change, chills, fatigue and fever.  HENT:  Negative for congestion, rhinorrhea, sinus pressure, sneezing and sore throat.   Eyes: Negative.   Respiratory: Negative for cough, chest tightness, shortness of breath and wheezing.   Cardiovascular: Negative for chest pain, palpitations and leg swelling.  Gastrointestinal: Negative for abdominal distention, abdominal pain, constipation, diarrhea, nausea and vomiting.  Endocrine: Negative for cold intolerance, heat intolerance, polydipsia, polyphagia and polyuria.  Genitourinary: Negative for urgency.       Foley Catheter and Left Nephrostomy   Musculoskeletal: Positive for gait problem.  Skin: Negative for color change, pallor, rash and wound.       Right arm PICC line   Neurological: Negative for dizziness, seizures, syncope, light-headedness, numbness and headaches.  Hematological: Does not bruise/bleed easily.  Psychiatric/Behavioral: Negative for agitation, confusion, hallucinations and sleep disturbance.  The patient is not nervous/anxious.     Immunization History  Administered Date(s) Administered  . Influenza Split 06/26/2012  . Influenza-Unspecified 04/02/2015  . PPD Test 01/04/2015, 07/09/2015, 07/23/2015, 09/02/2015, 12/31/2015  . Pneumococcal-Unspecified 03/13/2014   Pertinent  Health Maintenance Due  Topic Date Due  . FOOT EXAM  10/19/1968  . OPHTHALMOLOGY EXAM  10/19/1968  . PAP SMEAR  10/20/1979  . COLONOSCOPY  10/19/2008  . MAMMOGRAM  09/19/2015  . INFLUENZA VACCINE  08/01/2016 (Originally 01/12/2016)  . HEMOGLOBIN A1C  06/19/2016   No flowsheet data found. Functional Status Survey:    Vitals:   05/03/16 0942  BP: 107/66  Pulse: 84  Resp: 20  Temp: 98.4 F (36.9 C)  SpO2: 94%  Weight: 96 lb 1.6 oz (43.6 kg)  Height: 5\' 6"  (1.676 m)   Body mass index is 15.51 kg/m. Physical Exam  Constitutional: She is oriented to person, place, and time. She appears well-developed and well-nourished. No distress.  HENT:  Head: Normocephalic.  Mouth/Throat:  Oropharynx is clear and moist. No oropharyngeal exudate.  Eyes: Conjunctivae and EOM are normal. Pupils are equal, round, and reactive to light. Right eye exhibits no discharge. Left eye exhibits no discharge. No scleral icterus.  Neck: Normal range of motion. No JVD present. No thyromegaly present.  Cardiovascular: Normal rate, regular rhythm, normal heart sounds and intact distal pulses.  Exam reveals no gallop and no friction rub.   No murmur heard. Pulmonary/Chest: Effort normal and breath sounds normal. No respiratory distress. She has no wheezes. She has no rales.  Abdominal: Soft. Bowel sounds are normal. She exhibits no distension. There is no rebound.  Genitourinary:  Genitourinary Comments: Foley cathter and Left Nephrostomy draining yellow color urine.   Musculoskeletal: She exhibits no edema, tenderness or deformity.  RUE /Bilateral LE's weakness   Lymphadenopathy:    She has no cervical adenopathy.  Neurological: She is oriented to person, place, and time.  Skin: Skin is warm and dry. No rash noted. No erythema. No pallor.  Sacral and right gluteal wound drsg dry, clean and intact. No signs of infections noted.  Right arm PICC line drsg dry, clean and intact surrounding tissue without any signs of infections.   Psychiatric: She has a normal mood and affect.    Labs reviewed:  Recent Labs  11/17/15 0709  03/09/16 0305  03/11/16 0406  03/12/16 1020  03/13/16 1500  03/16/16 0416 03/17/16 0425 03/18/16 0425 03/19/16 0450 03/24/16  NA 140  < > 141  < > 142  < >  --   < >  --   < > 144 142 143 141 142  K 3.7  < > 3.0*  < > 3.6  < >  --   < >  --   < > 3.0* 3.6 3.1* 3.5 4.1  CL 110  < > 111  < > 119*  < >  --   < >  --   < > 116* 114* 110 107  --   CO2 25  < > 22  < > 19*  < >  --   < >  --   < > 25 26 29 31   --   GLUCOSE 93  < > 131*  < > 76  < >  --   < >  --   < > 109* 96 90 101*  --   BUN 13  < > 38*  < > 9  < >  --   < >  --   < >  6 5* 5* 8 27*  CREATININE 0.54  < >  1.18*  < > 0.52  < >  --   < >  --   < > 0.47 0.37* 0.42* 0.40* 0.6  CALCIUM 9.5  < > 9.2  < > 9.0  < >  --   < >  --   < > 8.8* 8.6* 8.9 9.3  --   MG 1.8  --  1.8  < > 1.6*  --  1.4*  --  1.9  --  1.5*  --   --   --   --   PHOS 3.2  --  2.8  --  2.0*  --   --   --   --   --   --   --   --   --   --   < > = values in this interval not displayed.  Recent Labs  12/18/15 1035 12/19/15 0744  02/18/16 03/08/16 1240 03/24/16  AST 15 12*  < > 15 21 10*  ALT 8* 7*  < > 15 13* 11  ALKPHOS 40 50  < > 53 40 66  BILITOT 0.7 0.5  --   --  0.6  --   PROT 6.6 6.6  --   --  7.7  --   ALBUMIN 2.2* 2.3*  --   --  3.2*  --   < > = values in this interval not displayed.  Recent Labs  12/29/15 1355  02/08/16 1724  03/10/16 1340  03/17/16 0425 03/18/16 0425 03/19/16 0450 03/24/16  WBC 16.2*  < > 13.3*  < > 17.3*  < > 13.4* 12.3* 12.4* 9.8  NEUTROABS 11.4*  --  7.3  --  14.2*  --   --   --   --   --   HGB 7.0*  < > 8.4*  < > 7.2*  < > 7.8* 7.9* 8.0* 9.4*  HCT 23.0*  < > 25.5*  < > 21.0*  < > 23.4* 24.2* 25.2* 31*  MCV 85.5  < > 89.5  < > 86.4  < > 89.3 90.3 90.6  --   PLT 809*  < > 412*  < > 263  < > 443* 507* 526* 563*  < > = values in this interval not displayed. Lab Results  Component Value Date   TSH 1.787 07/21/2015   Lab Results  Component Value Date   HGBA1C 5.1 02/18/2016   Lab Results  Component Value Date   CHOL 169 02/18/2016   HDL 52 02/18/2016   LDLCALC 95 02/18/2016   TRIG 109 02/18/2016    Assessment/Plan Type 2 DM  CBG's ranging in the 80's-150's with episodic readings in the 50's. Asymptomatic. Metformin previously reduced to 250 mg Tablet twice daily. Will Discontinue Glipizide. Continue on Metformin. Continue to monitor CBG's will further reduce metformin if CBG's are < 150.   Left Nephrolithiasis  Has upcoming nephrolithotomy on 05/12/2016.Her Xarelto will be held five days prior to surgery per Pre-op orders. She will be NPO on 05/11/2016 after midnight.Facility  staff to obtain urine specimen for U/A and C/S one week prior to procedure.     Presence of  PICC Line Not in use. D/c Picc line. drsg dry, clean and intact no signs of infections.   Family/ staff Communication: Reviewed plan of care with patient and facility Nurse supervisor.   Labs/tests ordered: None

## 2016-05-04 ENCOUNTER — Non-Acute Institutional Stay (SKILLED_NURSING_FACILITY): Payer: Medicare Other | Admitting: Family

## 2016-05-04 ENCOUNTER — Inpatient Hospital Stay (HOSPITAL_COMMUNITY)
Admission: EM | Admit: 2016-05-04 | Discharge: 2016-05-17 | DRG: 871 | Disposition: A | Discharge status: Skilled Nursing Facility | Payer: Medicare Other | Attending: Family Medicine | Admitting: Family Medicine

## 2016-05-04 ENCOUNTER — Encounter (HOSPITAL_COMMUNITY): Payer: Self-pay | Admitting: Emergency Medicine

## 2016-05-04 ENCOUNTER — Other Ambulatory Visit: Payer: Self-pay

## 2016-05-04 ENCOUNTER — Emergency Department (HOSPITAL_COMMUNITY): Payer: Medicare Other

## 2016-05-04 DIAGNOSIS — A419 Sepsis, unspecified organism: Principal | ICD-10-CM | POA: Diagnosis present

## 2016-05-04 DIAGNOSIS — N3001 Acute cystitis with hematuria: Secondary | ICD-10-CM | POA: Diagnosis not present

## 2016-05-04 DIAGNOSIS — D649 Anemia, unspecified: Secondary | ICD-10-CM

## 2016-05-04 DIAGNOSIS — I129 Hypertensive chronic kidney disease with stage 1 through stage 4 chronic kidney disease, or unspecified chronic kidney disease: Secondary | ICD-10-CM | POA: Diagnosis present

## 2016-05-04 DIAGNOSIS — R197 Diarrhea, unspecified: Secondary | ICD-10-CM | POA: Diagnosis not present

## 2016-05-04 DIAGNOSIS — E1122 Type 2 diabetes mellitus with diabetic chronic kidney disease: Secondary | ICD-10-CM | POA: Diagnosis present

## 2016-05-04 DIAGNOSIS — Z7984 Long term (current) use of oral hypoglycemic drugs: Secondary | ICD-10-CM

## 2016-05-04 DIAGNOSIS — B9620 Unspecified Escherichia coli [E. coli] as the cause of diseases classified elsewhere: Secondary | ICD-10-CM | POA: Diagnosis present

## 2016-05-04 DIAGNOSIS — Z87891 Personal history of nicotine dependence: Secondary | ICD-10-CM

## 2016-05-04 DIAGNOSIS — E876 Hypokalemia: Secondary | ICD-10-CM | POA: Diagnosis present

## 2016-05-04 DIAGNOSIS — N329 Bladder disorder, unspecified: Secondary | ICD-10-CM | POA: Diagnosis present

## 2016-05-04 DIAGNOSIS — Z86718 Personal history of other venous thrombosis and embolism: Secondary | ICD-10-CM

## 2016-05-04 DIAGNOSIS — N179 Acute kidney failure, unspecified: Secondary | ICD-10-CM | POA: Diagnosis present

## 2016-05-04 DIAGNOSIS — R6521 Severe sepsis with septic shock: Secondary | ICD-10-CM | POA: Diagnosis present

## 2016-05-04 DIAGNOSIS — N183 Chronic kidney disease, stage 3 (moderate): Secondary | ICD-10-CM | POA: Diagnosis present

## 2016-05-04 DIAGNOSIS — N136 Pyonephrosis: Secondary | ICD-10-CM | POA: Diagnosis present

## 2016-05-04 DIAGNOSIS — Z7901 Long term (current) use of anticoagulants: Secondary | ICD-10-CM

## 2016-05-04 DIAGNOSIS — Z936 Other artificial openings of urinary tract status: Secondary | ICD-10-CM

## 2016-05-04 DIAGNOSIS — Z8673 Personal history of transient ischemic attack (TIA), and cerebral infarction without residual deficits: Secondary | ICD-10-CM

## 2016-05-04 DIAGNOSIS — E872 Acidosis: Secondary | ICD-10-CM | POA: Diagnosis present

## 2016-05-04 DIAGNOSIS — N1330 Unspecified hydronephrosis: Secondary | ICD-10-CM

## 2016-05-04 DIAGNOSIS — Z79899 Other long term (current) drug therapy: Secondary | ICD-10-CM

## 2016-05-04 DIAGNOSIS — J9601 Acute respiratory failure with hypoxia: Secondary | ICD-10-CM | POA: Diagnosis not present

## 2016-05-04 DIAGNOSIS — K219 Gastro-esophageal reflux disease without esophagitis: Secondary | ICD-10-CM | POA: Diagnosis present

## 2016-05-04 DIAGNOSIS — K922 Gastrointestinal hemorrhage, unspecified: Secondary | ICD-10-CM | POA: Diagnosis present

## 2016-05-04 DIAGNOSIS — E1140 Type 2 diabetes mellitus with diabetic neuropathy, unspecified: Secondary | ICD-10-CM | POA: Diagnosis present

## 2016-05-04 DIAGNOSIS — J9690 Respiratory failure, unspecified, unspecified whether with hypoxia or hypercapnia: Secondary | ICD-10-CM

## 2016-05-04 DIAGNOSIS — G35 Multiple sclerosis: Secondary | ICD-10-CM | POA: Diagnosis present

## 2016-05-04 DIAGNOSIS — G3289 Other specified degenerative disorders of nervous system in diseases classified elsewhere: Secondary | ICD-10-CM | POA: Diagnosis present

## 2016-05-04 DIAGNOSIS — N111 Chronic obstructive pyelonephritis: Secondary | ICD-10-CM | POA: Diagnosis present

## 2016-05-04 LAB — CBC AND DIFFERENTIAL
HEMATOCRIT: 32 % — AB (ref 36–46)
Hemoglobin: 10.5 g/dL — AB (ref 12.0–16.0)
PLATELETS: 465 10*3/uL — AB (ref 150–399)
WBC: 16.5 10^3/mL

## 2016-05-04 LAB — I-STAT CG4 LACTIC ACID, ED: LACTIC ACID, VENOUS: 3.23 mmol/L — AB (ref 0.5–1.9)

## 2016-05-04 MED ORDER — SODIUM CHLORIDE 0.9 % IV BOLUS (SEPSIS)
500.0000 mL | Freq: Once | INTRAVENOUS | Status: AC
  Administered 2016-05-04: 500 mL via INTRAVENOUS

## 2016-05-04 MED ORDER — KETAMINE HCL 10 MG/ML IJ SOLN
INTRAMUSCULAR | Status: DC | PRN
  Administered 2016-05-04: 25 mg via INTRAVENOUS

## 2016-05-04 MED ORDER — PIPERACILLIN-TAZOBACTAM 3.375 G IVPB 30 MIN
3.3750 g | Freq: Once | INTRAVENOUS | Status: AC
  Administered 2016-05-04: 3.375 g via INTRAVENOUS
  Filled 2016-05-04: qty 50

## 2016-05-04 MED ORDER — SODIUM CHLORIDE 0.9 % IV BOLUS (SEPSIS)
1000.0000 mL | Freq: Once | INTRAVENOUS | Status: AC
  Administered 2016-05-04: 1000 mL via INTRAVENOUS

## 2016-05-04 MED ORDER — VANCOMYCIN HCL IN DEXTROSE 1-5 GM/200ML-% IV SOLN
1000.0000 mg | Freq: Once | INTRAVENOUS | Status: AC
  Administered 2016-05-04: 1000 mg via INTRAVENOUS
  Filled 2016-05-04: qty 200

## 2016-05-04 MED ORDER — ROCURONIUM BROMIDE 50 MG/5ML IV SOLN
INTRAVENOUS | Status: DC | PRN
  Administered 2016-05-04: 100 mg via INTRAVENOUS

## 2016-05-04 MED ORDER — KETAMINE HCL-SODIUM CHLORIDE 100-0.9 MG/10ML-% IV SOSY
PREFILLED_SYRINGE | INTRAVENOUS | Status: AC
  Filled 2016-05-04: qty 10

## 2016-05-04 MED ORDER — SODIUM CHLORIDE 0.9 % IV SOLN
1000.0000 mL | INTRAVENOUS | Status: DC
  Administered 2016-05-05: 1000 mL via INTRAVENOUS

## 2016-05-04 MED ORDER — CEFTRIAXONE SODIUM 1 G IJ SOLR: 1.0000 g | INTRAMUSCULAR | Status: DC

## 2016-05-04 NOTE — Progress Notes (Signed)
Location:  [facility] Room Number: [phone]  Place of Service:  SNF (31) Provider:  Winferd Wease FNP-C   Blanchie Serve, MD  Patient Care Team: Blanchie Serve, MD as PCP - General (Internal Medicine)  Extended Emergency Contact Information Primary Emergency Contact: [facility] Address: [address]          [place], [place] [address] Johnnette Litter of [place] Phone: [phone] Mobile Phone: [phone] Relation: Relative Secondary Emergency Contact: Hughes,Michael Address: [address]          [place], [place] [address] Johnnette Litter of [place] Phone: [phone] Mobile Phone: [phone] Relation: Relative  Code Status:  Full code  Goals of care: Advanced Directive information Advanced Directives 05/03/2016  Does Patient Have a Medical Advance Directive? No  Type of Advance Directive -  Does patient want to make changes to medical advance directive? -  Copy of [place] in Chart? -  Would patient like information on creating a medical advance directive? -  Pre-existing out of facility DNR order (yellow form or pink MOST form) -     Chief Complaint  Patient presents with  . Acute Visit    abnormal lab results     HPI:  Pt is a 55 y.o. female seen today at [facility] for an acute visit for evaluation of abnormal lab results. She is seen in her room today. She denies any acute issues this visit. Facility Nurse reports patient was confused during the night. Her lab results showed WBC 16.5 ( 05/04/2016), Urine Analysis showed cloudy urine, large leukocytes, Nitrites negative and large blood. Urine Culture showed E.Coli > 100,000 colonies ESBL negative. She denies any fever, chills or cough.    Past Medical History:  Diagnosis Date  . Acute renal failure superimposed on stage 3 chronic kidney disease ([place]) 07/21/2015  . Anemia    chronic   . Diabetes mellitus without complication  ([place])   . Diabetic neuropathy ([place])   . DVT (deep venous thrombosis) ([place])   . DVT (deep venous thrombosis) ([place])   . GERD (gastroesophageal reflux disease)   . Hx of sepsis   . Hypertension   . Left nephrolithiasis   . Leukocytosis   . Lymph edema    chronic   . Malnutrition of moderate degree 07/21/2015  . Multiple sclerosis diagnosed 2002-not on Therapy any longer 06/26/2012  . Muscle weakness (generalized)   . Neuromuscular disorder ([place])   . Neuromuscular dysfunction of bladder   . Polyneuropathy ([place])   . Presence of indwelling urinary catheter   . Protein calorie malnutrition ([place])   . Stage 4 decubitus ulcer ([place]) 07/05/2015  . Stroke ([place])   . UTI (lower urinary tract infection)    Past Surgical History:  Procedure Laterality Date  . ESOPHAGOGASTRODUODENOSCOPY Left 01/02/2015   Procedure: ESOPHAGOGASTRODUODENOSCOPY (EGD);  Surgeon: Arta Silence, MD;  Location: [facility];  Service: Endoscopy;  Laterality: Left;  . HERNIA MESH REMOVAL    . IR GENERIC HISTORICAL  02/23/2016   IR NEPHROSTOMY EXCHANGE LEFT 02/23/2016 Sandi Mariscal, MD [facility]  . IR GENERIC HISTORICAL  04/19/2016   IR NEPHROSTOMY EXCHANGE LEFT 04/19/2016 Arne Cleveland, MD [facility]  . NEPHROSTOMY TUBE PLACEMENT ([place] HX)    . TRANSURETHRAL RESECTION OF BLADDER TUMOR N/A 03/16/2016   Procedure: CYSTOSCOPY BLADDER BIOPSY;  Surgeon: Franchot Gallo, MD;  [facility];  Service: Urology;  Laterality: N/A;  . TUBAL LIGATION    .  tubes tided      No Known Allergies    Medication List       Accurate as of 05/04/16  1:48 PM. Always use your most recent med list.          acetaminophen 325 MG tablet Commonly known as:  TYLENOL Take 2 tablets (650 mg total) by mouth every 6 (six) hours as needed for fever.   albuterol (2.5 MG/3ML) 0.083% nebulizer solution Commonly known as:  PROVENTIL Take 3 mLs (2.5 mg total) by nebulization every 6 (six) hours as needed for wheezing or shortness of  breath.   atorvastatin 10 MG tablet Commonly known as:  LIPITOR Take 10 mg by mouth at bedtime.   baclofen 10 MG tablet Commonly known as:  LIORESAL Take 1 tablet (10 mg total) by mouth 3 (three) times daily.   famotidine 20 MG tablet Commonly known as:  PEPCID Take 1 tablet (20 mg total) by mouth 2 (two) times daily.   feeding supplement (PRO-STAT SUGAR FREE 64) Liqd Take 30 mLs by mouth 2 (two) times daily.   furosemide 20 MG tablet Commonly known as:  LASIX Take 40 mg by mouth 2 (two) times daily.   gabapentin 100 MG capsule Commonly known as:  NEURONTIN Take 1 capsule (100 mg total) by mouth at bedtime.   lisinopril 2.5 MG tablet Commonly known as:  PRINIVIL,ZESTRIL Take 2.5 mg by mouth daily.   metFORMIN 500 MG tablet Commonly known as:  GLUCOPHAGE Take 0.5 tablets (250 mg total) by mouth 2 (two) times daily.   multivitamin with minerals Tabs tablet Take 1 tablet by mouth daily.   polyethylene glycol packet Commonly known as:  MIRALAX / GLYCOLAX Take 17 g by mouth 2 (two) times daily.   potassium chloride 10 MEQ tablet Commonly known as:  K-DUR,KLOR-CON Take 1 tablet (10 mEq total) by mouth daily.   rivaroxaban 20 MG Tabs tablet Commonly known as:  XARELTO Take 1 tablet (20 mg total) by mouth daily with supper. Reported on 08/12/2015   senna-docusate 8.6-50 MG tablet Commonly known as:  Senokot-S Take 1 tablet by mouth 2 (two) times daily.   solifenacin 5 MG tablet Commonly known as:  VESICARE Take 1 tablet (5 mg total) by mouth daily.       Review of Systems  Constitutional: Negative for activity change, appetite change, chills, fatigue and fever.  HENT: Negative for congestion, rhinorrhea, sinus pressure, sneezing and sore throat.   Eyes: Negative.   Respiratory: Negative for cough, chest tightness, shortness of breath and wheezing.   Cardiovascular: Negative for chest pain, palpitations and leg swelling.  Gastrointestinal: Negative for abdominal  distention, abdominal pain, constipation, diarrhea, nausea and vomiting.  Endocrine: Negative for cold intolerance, heat intolerance, polydipsia, polyphagia and polyuria.  Genitourinary: Negative for urgency.       Foley Catheter and Left Nephrostomy   Musculoskeletal: Positive for gait problem.  Skin: Negative for color change, pallor, rash and wound.  Neurological: Negative for dizziness, seizures, syncope, light-headedness, numbness and headaches.  Hematological: Does not bruise/bleed easily.  Psychiatric/Behavioral: Negative for agitation, confusion, hallucinations and sleep disturbance. The patient is not nervous/anxious.     Immunization History  Administered Date(s) Administered  . Influenza Split 06/26/2012  . Influenza-Unspecified 04/02/2015  . PPD Test 01/04/2015, 07/09/2015, 07/23/2015, 09/02/2015, 12/31/2015  . Pneumococcal-Unspecified 03/13/2014   Pertinent  Health Maintenance Due  Topic Date Due  . FOOT EXAM  10/19/1968  . OPHTHALMOLOGY EXAM  10/19/1968  . PAP SMEAR  10/20/1979  .  COLONOSCOPY  10/19/2008  . MAMMOGRAM  09/19/2015  . INFLUENZA VACCINE  08/01/2016 (Originally 01/12/2016)  . HEMOGLOBIN A1C  08/17/2016      Vitals:   05/04/16 1200  BP: 111/65  Pulse: 88  Resp: 20  Temp: 100 F (37.8 C)  SpO2: 96%  Weight: 96 lb 1.6 oz (43.6 kg)  Height: 5\' 6"  (1.676 m)   Body mass index is 15.51 kg/m. Physical Exam  Constitutional: She is oriented to person, place, and time. She appears well-developed and well-nourished. No distress.  HENT:  Head: Normocephalic.  Mouth/Throat: Oropharynx is clear and moist. No oropharyngeal exudate.  Eyes: Conjunctivae and EOM are normal. Pupils are equal, round, and reactive to light. Right eye exhibits no discharge. Left eye exhibits no discharge. No scleral icterus.  Neck: Normal range of motion. No JVD present. No thyromegaly present.  Cardiovascular: Normal rate, regular rhythm, normal heart sounds and intact distal  pulses.  Exam reveals no gallop and no friction rub.   No murmur heard. Pulmonary/Chest: Effort normal and breath sounds normal. No respiratory distress. She has no wheezes. She has no rales.  Abdominal: Soft. Bowel sounds are normal. She exhibits no distension. There is no rebound.  Genitourinary:  Genitourinary Comments: Foley cathter and Left Nephrostomy draining adequate amount of urine.   Musculoskeletal: She exhibits no edema, tenderness or deformity.  RUE /Bilateral LE's weakness   Lymphadenopathy:    She has no cervical adenopathy.  Neurological: She is oriented to person, place, and time.  Skin: Skin is warm and dry. No rash noted. No erythema. No pallor.  Sacral and right gluteal wound drsg dry, clean and intact. No signs of infections noted.  Psychiatric: She has a normal mood and affect.    Labs reviewed:  Recent Labs  11/17/15 0709  03/09/16 0305  03/11/16 0406  03/12/16 1020  03/13/16 1500  03/16/16 0416 03/17/16 0425 03/18/16 0425 03/19/16 0450 03/24/16  NA 140  < > 141  < > 142  < >  --   < >  --   < > 144 142 143 141 142  K 3.7  < > 3.0*  < > 3.6  < >  --   < >  --   < > 3.0* 3.6 3.1* 3.5 4.1  CL 110  < > 111  < > 119*  < >  --   < >  --   < > 116* 114* 110 107  --   CO2 25  < > 22  < > 19*  < >  --   < >  --   < > 25 26 29 31   --   GLUCOSE 93  < > 131*  < > 76  < >  --   < >  --   < > 109* 96 90 101*  --   BUN 13  < > 38*  < > 9  < >  --   < >  --   < > 6 5* 5* 8 27*  CREATININE 0.54  < > 1.18*  < > 0.52  < >  --   < >  --   < > 0.47 0.37* 0.42* 0.40* 0.6  CALCIUM 9.5  < > 9.2  < > 9.0  < >  --   < >  --   < > 8.8* 8.6* 8.9 9.3  --   MG 1.8  --  1.8  < > 1.6*  --  1.4*  --  1.9  --  1.5*  --   --   --   --   PHOS 3.2  --  2.8  --  2.0*  --   --   --   --   --   --   --   --   --   --   < > = values in this interval not displayed.  Recent Labs  12/18/15 1035 12/19/15 0744  02/18/16 03/08/16 1240 03/24/16  AST 15 12*  < > 15 21 10*  ALT 8* 7*  < > 15 13*  11  ALKPHOS 40 50  < > 53 40 66  BILITOT 0.7 0.5  --   --  0.6  --   PROT 6.6 6.6  --   --  7.7  --   ALBUMIN 2.2* 2.3*  --   --  3.2*  --   < > = values in this interval not displayed.  Recent Labs  12/29/15 1355  02/08/16 1724  03/10/16 1340  03/17/16 0425 03/18/16 0425 03/19/16 0450 03/24/16  WBC 16.2*  < > 13.3*  < > 17.3*  < > 13.4* 12.3* 12.4* 9.8  NEUTROABS 11.4*  --  7.3  --  14.2*  --   --   --   --   --   HGB 7.0*  < > 8.4*  < > 7.2*  < > 7.8* 7.9* 8.0* 9.4*  HCT 23.0*  < > 25.5*  < > 21.0*  < > 23.4* 24.2* 25.2* 31*  MCV 85.5  < > 89.5  < > 86.4  < > 89.3 90.3 90.6  --   PLT 809*  < > 412*  < > 263  < > 443* 507* 526* 563*  < > = values in this interval not displayed. Lab Results  Component Value Date   TSH 1.787 07/21/2015   Lab Results  Component Value Date   HGBA1C 5.1 02/18/2016   Lab Results  Component Value Date   CHOL 169 02/18/2016   HDL 52 02/18/2016   LDLCALC 95 02/18/2016   TRIG 109 02/18/2016      Assessment/Plan Acute cystitis with hematuria Temp 100.0  WBC 16.5 ( 05/04/2016), Urine Analysis showed cloudy urine, large leukocytes, Nitrites negative and large blood. Urine Culture showed E.Coli > 100,000 colonies ESBL negative ( 04/28/2016). Start Ceftriaxone 1 Gm I.M. Every 24 hours X 5 days. Florastor 250 mg Capsule one by mouth twice daily X 10 days for antibiotics associated diarrhea preventions. Monitor vital signs every shift X 5 days then resume previous orders. CBC/diff, BMP 05/09/2016     Family/ staff Communication: Reviewed plan of care with patient and facility Nurse supervisor.   Labs/tests ordered:  CBC/diff, BMP 05/09/2016

## 2016-05-04 NOTE — ED Triage Notes (Signed)
Pt bought to ED by [facility] from [facility] for pt becoming progressively lethargic since yesterday stated today on rocephin for possible UTI, only responding to pain on EMS arrival, SBP 80 palpable., HR 140, no urine output, pt has a foley and nephrostomy tube, 78% on RA 100% on 4L [place], CBG 187.

## 2016-05-04 NOTE — Code Documentation (Signed)
Intubation completed 

## 2016-05-04 NOTE — ED Provider Notes (Signed)
[facility] DEPT Provider Note   CSN: [id number] Arrival date & time: 05/04/16  2314  By signing my name below, I, Delton Prairie, attest that this documentation has been prepared under the direction and in the presence of Ezequiel Essex, MD  Electronically Signed: Delton Prairie, ED Scribe. 05/04/16. 1:06 AM.   History   Chief Complaint Chief Complaint  Patient presents with  . Altered Mental Status   The history is provided by the EMS personnel and the nursing home. No language interpreter was used.   HPI Comments: LEVEL 5 CAVEAT DUE TO UNRESPONSIVENESS  BETHENNY PALELLA is a 55 y.o. female who presents to the Emergency Department, from [facility],  with altered mental status x 1 day. Per EMS personnel, pt has been progressively lethargic since yesterday. She is currently unresponsive. Pt is on Rocephin for a possible UTI and Xarelto. She has not had any urine output and has a foley and nephrostomy tube.    Past Medical History:  Diagnosis Date  . Acute renal failure superimposed on stage 3 chronic kidney disease ([place]) 07/21/2015  . Anemia    chronic   . Diabetes mellitus without complication ([place])   . Diabetic neuropathy ([place])   . DVT (deep venous thrombosis) ([place])   . DVT (deep venous thrombosis) ([place])   . GERD (gastroesophageal reflux disease)   . Hx of sepsis   . Hypertension   . Left nephrolithiasis   . Leukocytosis   . Lymph edema    chronic   . Malnutrition of moderate degree 07/21/2015  . Multiple sclerosis diagnosed 2002-not on Therapy any longer 06/26/2012  . Muscle weakness (generalized)   . Neuromuscular disorder ([place])   . Neuromuscular dysfunction of bladder   . Polyneuropathy ([place])   . Presence of indwelling urinary catheter   . Protein calorie malnutrition ([place])   . Stage 4 decubitus ulcer ([place]) 07/05/2015  . Stroke ([place])   . UTI (lower urinary tract infection)     Patient Active Problem List   Diagnosis Date Noted  . Pelvic abscess in female  03/17/2016  . Pressure injury of skin 03/12/2016  . Septic shock ([place]) 03/08/2016  . Encounter for central line placement   . Arterial hypotension   . Neuropathic pain 01/11/2016  . Attention to nephrostomy ([place]) 01/11/2016  . Acute renal failure ([place])   . Partial small bowel obstruction 12/24/2015  . Bacteremia due to coagulase-negative Staphylococcus 12/22/2015  . Left nephrolithiasis 12/22/2015  . Gram-negative bacteremia 12/22/2015  . Palliative care encounter   . SIRS (systemic inflammatory response syndrome) ([place]) 12/18/2015  . Pressure ulcer   . Controlled diabetes mellitus type 2 with complications ([place])   . Neurogenic bladder 11/11/2015  . Physical deconditioning 11/11/2015  . Chronic indwelling Foley catheter 09/30/2015  . Hypokalemia 09/04/2015  . Dyslipidemia associated with type 2 diabetes mellitus ([place]) 08/28/2015  . GERD without esophagitis 08/28/2015  . Acute renal failure superimposed on stage 3 chronic kidney disease ([place]) 07/21/2015  . Leukocytosis 07/21/2015  . Anemia of chronic disease 07/21/2015  . Hyperkalemia 07/21/2015  . Catheter-associated urinary tract infection ([place]) 07/21/2015  . Malnutrition of moderate degree 07/21/2015  . DVT, lower extremity ([place]) 12/28/2014  . Multiple sclerosis diagnosed 2002-not on Therapy any longer 06/26/2012    Past Surgical History:  Procedure Laterality Date  . ESOPHAGOGASTRODUODENOSCOPY Left 01/02/2015   Procedure: ESOPHAGOGASTRODUODENOSCOPY (EGD);  Surgeon: Arta Silence, MD;  Location: [facility];  Service: Endoscopy;  Laterality: Left;  . HERNIA MESH REMOVAL    .  IR GENERIC HISTORICAL  02/23/2016   IR NEPHROSTOMY EXCHANGE LEFT 02/23/2016 Sandi Mariscal, MD [facility]  . IR GENERIC HISTORICAL  04/19/2016   IR NEPHROSTOMY EXCHANGE LEFT 04/19/2016 Arne [place], MD [facility]  . NEPHROSTOMY TUBE PLACEMENT ([place] [facility])    . TRANSURETHRAL RESECTION OF BLADDER TUMOR N/A 03/16/2016   Procedure: CYSTOSCOPY BLADDER BIOPSY;   Surgeon: Franchot Gallo, MD;  Location: [facility];  Service: Urology;  Laterality: N/A;  . TUBAL LIGATION    . tubes tided      OB History    No data available       Home Medications    Prior to Admission medications   Medication Sig Start Date End Date Taking? Authorizing Provider  acetaminophen (TYLENOL) 325 MG tablet Take 2 tablets (650 mg total) by mouth every 6 (six) hours as needed for fever. 03/20/16   Bonnielee Haff, MD  albuterol (PROVENTIL) (2.5 MG/3ML) 0.083% nebulizer solution Take 3 mLs (2.5 mg total) by nebulization every 6 (six) hours as needed for wheezing or shortness of breath. 12/30/15   Robbie Lis, MD  Amino Acids-Protein Hydrolys (FEEDING SUPPLEMENT, PRO-STAT SUGAR FREE 64,) LIQD Take 30 mLs by mouth 2 (two) times daily. 11/18/15   Cherene Altes, MD  atorvastatin (LIPITOR) 10 MG tablet Take 10 mg by mouth at bedtime.     Historical Provider, MD  baclofen (LIORESAL) 10 MG tablet Take 1 tablet (10 mg total) by mouth 3 (three) times daily. 11/18/15   Cherene Altes, MD  famotidine (PEPCID) 20 MG tablet Take 1 tablet (20 mg total) by mouth 2 (two) times daily. 11/18/15   Cherene Altes, MD  furosemide (LASIX) 20 MG tablet Take 40 mg by mouth 2 (two) times daily.  01/29/16   Historical Provider, MD  gabapentin (NEURONTIN) 100 MG capsule Take 1 capsule (100 mg total) by mouth at bedtime. 11/18/15   Cherene Altes, MD  lisinopril (PRINIVIL,ZESTRIL) 2.5 MG tablet Take 2.5 mg by mouth daily.  11/30/15   Historical Provider, MD  metFORMIN (GLUCOPHAGE) 500 MG tablet Take 0.5 tablets (250 mg total) by mouth 2 (two) times daily. 04/22/16   Dinah C Ngetich, NP  Multiple Vitamin (MULTIVITAMIN WITH MINERALS) TABS tablet Take 1 tablet by mouth daily. 07/09/15   Florencia Reasons, MD  polyethylene glycol (MIRALAX / Floria Raveling) packet Take 17 g by mouth 2 (two) times daily. 11/18/15   Cherene Altes, MD  potassium chloride (K-DUR,KLOR-CON) 10 MEQ tablet Take 1 tablet (10 mEq total) by mouth  daily. 03/21/16   Bonnielee Haff, MD  rivaroxaban (XARELTO) 20 MG TABS tablet Take 1 tablet (20 mg total) by mouth daily with supper. Reported on 08/12/2015 11/18/15   Cherene Altes, MD  senna-docusate (SENOKOT-S) 8.6-50 MG tablet Take 1 tablet by mouth 2 (two) times daily. 12/30/15   Robbie Lis, MD  solifenacin (VESICARE) 5 MG tablet Take 1 tablet (5 mg total) by mouth daily. 11/18/15   Cherene Altes, MD    Family History Family History  Problem Relation Age of Onset  . Diabetes Mother   . Alzheimer's disease Mother   . Diabetes Father   . Diabetes Sister   . Scoliosis Sister     Social History Social History  Substance Use Topics  . Smoking status: Former Smoker    Packs/day: 1.00    Years: 20.00    Types: Cigarettes    Quit date: 11/19/2014  . Smokeless tobacco: Never Used  . Alcohol use 3.0  oz/week    5 Glasses of wine per week     Allergies   Patient has no known allergies.  Review of Systems Review of Systems  Unable to perform ROS: Patient unresponsive   Physical Exam Updated Vital Signs Ht 5\' 6"  (1.676 m)   Wt 96 lb (43.5 kg)   BMI 15.49 kg/m   Physical Exam  Constitutional: She appears well-developed and well-nourished. No distress.  Moans  HENT:  Head: Normocephalic and atraumatic.  Mouth/Throat: Oropharynx is clear and moist. Mucous membranes are dry. No oropharyngeal exudate.  Eyes: Conjunctivae and EOM are normal. Pupils are equal, round, and reactive to light.  Neck: Normal range of motion. Neck supple.  No meningismus.  Cardiovascular: Regular rhythm, normal heart sounds and intact distal pulses.  Tachycardia present.   No murmur heard. Tachycardic in 130s  Pulmonary/Chest: Effort normal and breath sounds normal.  Breath sounds equal. Lungs clear  Abdominal: Soft. There is tenderness. There is no rebound and no guarding.  Diffusely tender  Genitourinary:  Genitourinary Comments: Foley in place with red, cloudy urine. Left nephrostomy bag  with purulent drainage in bag.   Musculoskeletal: Normal range of motion. She exhibits no edema or tenderness.  Neurological: No cranial nerve deficit. She exhibits normal muscle tone. Coordination normal.  Unresponsive. Does not follow commands.   Skin: Skin is warm.  Skin is cold and clammy. Skin site of nephrostomy bag shows no erythema.   Nursing note and vitals reviewed.  ED Treatments / Results  DIAGNOSTIC STUDIES: Oxygen Saturation is 98% on Van, normal by my interpretation.    COORDINATION OF CARE:  11:54 PM Discussed treatment plan with pt at bedside and pt agreed to plan. 1:05 AM Foley replaced by nurse. Foley was coated in feces. New catheter replaced, shows purulent urine.   Labs (all labs ordered are listed, but only abnormal results are displayed) Labs Reviewed  CBC WITH DIFFERENTIAL/PLATELET - Abnormal; Notable for the following:       Result Value   RBC 1.55 (*)    Hemoglobin 4.3 (*)    HCT 14.1 (*)    RDW 16.9 (*)    Lymphs Abs 0.4 (*)    All other components within normal limits  URINALYSIS, ROUTINE W REFLEX MICROSCOPIC (NOT AT [facility]) - Abnormal; Notable for the following:    Color, Urine STRAW (*)    APPearance TURBID (*)    Hgb urine dipstick LARGE (*)    Ketones, ur 15 (*)    Protein, ur 100 (*)    Leukocytes, UA LARGE (*)    All other components within normal limits  PROTIME-INR - Abnormal; Notable for the following:    Prothrombin Time 49.4 (*)    INR 5.20 (*)    All other components within normal limits  COMPREHENSIVE METABOLIC PANEL - Abnormal; Notable for the following:    CO2 14 (*)    Glucose, Bld 161 (*)    BUN 63 (*)    Creatinine, Ser 3.20 (*)    Calcium 8.5 (*)    Total Protein 5.3 (*)    Albumin 2.2 (*)    Alkaline Phosphatase 37 (*)    Total Bilirubin 1.3 (*)    GFR calc non Af Amer 15 (*)    GFR calc Af Amer 17 (*)    All other components within normal limits  CBC WITH DIFFERENTIAL/PLATELET - Abnormal; Notable for the following:      Hemoglobin 11.4 (*)    HCT 34.8 (*)  RDW 15.8 (*)    Neutro Abs 7.8 (*)    Lymphs Abs 0.6 (*)    All other components within normal limits  LACTIC ACID, PLASMA - Abnormal; Notable for the following:    Lactic Acid, Venous 4.4 (*)    All other components within normal limits  LACTIC ACID, PLASMA - Abnormal; Notable for the following:    Lactic Acid, Venous 4.3 (*)    All other components within normal limits  MAGNESIUM - Abnormal; Notable for the following:    Magnesium 1.3 (*)    All other components within normal limits  PROTIME-INR - Abnormal; Notable for the following:    Prothrombin Time 26.4 (*)    All other components within normal limits  BLOOD GAS, ARTERIAL - Abnormal; Notable for the following:    pCO2 arterial 20.3 (*)    pO2, Arterial 209 (*)    Bicarbonate 12.2 (*)    Acid-base deficit 11.9 (*)    All other components within normal limits  CBC - Abnormal; Notable for the following:    RBC 3.78 (*)    Hemoglobin 11.2 (*)    HCT 33.3 (*)    RDW 15.7 (*)    All other components within normal limits  BASIC METABOLIC PANEL - Abnormal; Notable for the following:    Chloride 112 (*)    CO2 13 (*)    Glucose, Bld 176 (*)    BUN 57 (*)    Creatinine, Ser 2.78 (*)    Calcium 8.2 (*)    GFR calc non Af Amer 18 (*)    GFR calc Af Amer 21 (*)    All other components within normal limits  URINE MICROSCOPIC-ADD ON - Abnormal; Notable for the following:    Squamous Epithelial / LPF 0-5 (*)    Bacteria, UA MANY (*)    All other components within normal limits  I-STAT CG4 LACTIC ACID, ED - Abnormal; Notable for the following:    Lactic Acid, Venous 3.23 (*)    All other components within normal limits  I-STAT ARTERIAL BLOOD GAS, ED - Abnormal; Notable for the following:    pH, Arterial 7.251 (*)    pO2, Arterial 447.0 (*)    Bicarbonate 15.1 (*)    Acid-base deficit 11.0 (*)    All other components within normal limits  POC OCCULT BLOOD, ED - Abnormal; Notable for  the following:    Fecal Occult Bld POSITIVE (*)    All other components within normal limits  CULTURE, BLOOD (ROUTINE X 2)  CULTURE, BLOOD (ROUTINE X 2)  URINE CULTURE  MRSA CULTURE  PHOSPHORUS  CORTISOL  TROPONIN I  TROPONIN I  TYPE AND SCREEN  PREPARE RBC (CROSSMATCH)    EKG  EKG Interpretation  Date/Time:  Wednesday May 04 2016 23:19:03 EST Ventricular Rate:  136 PR Interval:    QRS Duration: 86 QT Interval:  286 QTC Calculation: 431 R Axis:   51 Text Interpretation:  Sinus tachycardia Ventricular premature complex Aberrant complex ST elevation, consider inferior injury Rate faster Confirmed by Wyvonnia Dusky  MD, Donda Friedli [phone]) on 05/05/2016 12:07:29 AM       Radiology Dg Chest Portable 1 View  Result Date: 05/05/2016 CLINICAL DATA:  Central line placement EXAM: PORTABLE CHEST 1 VIEW COMPARISON:  05/04/2016 at 2359 hours FINDINGS: Endotracheal tube tip measures 4.1 cm above the carina. A left central venous catheter has been placed with tip over the low SVC region. No pneumothorax. Enteric tube tip is off  the field of view but below the left hemidiaphragm. Heart size and pulmonary vascularity are normal. Shallow inspiration with atelectasis in the left lung base. No blunting of costophrenic angles. No pneumothorax. Mediastinal contours appear intact. IMPRESSION: Appliances appear in satisfactory position. Atelectasis in the left lung base. Electronically Signed   By: Lucienne Capers M.D.   On: 05/05/2016 03:21   Dg Chest Port 1 View  Result Date: 05/05/2016 CLINICAL DATA:  Endotracheal tube adjustment EXAM: PORTABLE CHEST 1 VIEW COMPARISON:  05/04/2016 FINDINGS: Endotracheal tube has been pulled back but tip is still low, measuring 1.3 cm above the carina. Consider withdrawing the tube another 1-2 cm. Enteric tube is unchanged in position. Shallow inspiration. Normal heart size and pulmonary vascularity. Lungs are clear. IMPRESSION: Endotracheal tube has been pulled back of  the right mainstem bronchus but remains low, measuring only about 1.3 cm above the carina. Consider withdrawing the tube another 1-2 cm. Electronically Signed   By: Lucienne Capers M.D.   On: 05/05/2016 00:30   Dg Chest Port 1 View  Result Date: 05/05/2016 CLINICAL DATA:  Endotracheal tube placement EXAM: PORTABLE CHEST 1 VIEW COMPARISON:  03/19/2016 FINDINGS: Endotracheal tube has been placed with tip only about 3 mm above the carina and directed to the mainstem bronchus on the right. A subsequent image has been obtained after repositioning of the tube. Enteric tube tip is off the field of view but below the left hemidiaphragm. Shallow inspiration. Heart size and pulmonary vascularity are normal. Lungs are clear. IMPRESSION: Endotracheal tube tip is at the carina directed towards the right mainstem bronchus. Shallow inspiration. Electronically Signed   By: Lucienne Capers M.D.   On: 05/05/2016 00:27   Ct Renal Stone Study  Result Date: 05/05/2016 CLINICAL DATA:  Acute onset of septic shock.  Initial encounter. EXAM: CT ABDOMEN AND PELVIS WITHOUT CONTRAST TECHNIQUE: Multidetector CT imaging of the abdomen and pelvis was performed following the standard protocol without IV contrast. COMPARISON:  CT of the abdomen and pelvis from 04/19/2016 FINDINGS: Lower chest: Mild bibasilar atelectasis is noted. Trace pericardial fluid likely remains within normal limits. Hepatobiliary: The liver is unremarkable in appearance. The gallbladder is unremarkable in appearance. The common bile duct remains normal in caliber. Pancreas: The pancreas is within normal limits. Spleen: The spleen is unremarkable in appearance. Adrenals/Urinary Tract: The adrenal glands are unremarkable in appearance. The kidneys are within normal limits. There is no evidence of hydronephrosis. No renal or ureteral stones are identified. No perinephric stranding is seen. Mild to moderate bilateral hydronephrosis is noted. A left-sided nephrostomy  tube is noted. Two large left renal stones measure up to 1.9 cm in size. Nonspecific perinephric stranding is noted bilaterally. A large obstructing left ureteral stone is noted, measuring 1.9 x 1.0 cm, 2 cm above the left renal pelvis. Stomach/Bowel: The patient's enteric tube is noted ending at the antrum of the stomach. The stomach is grossly unremarkable in appearance. The small bowel is unremarkable. The appendix is normal in caliber, without evidence of appendicitis. The colon is partially filled with stool. There is mild wall thickening along the proximal sigmoid colon, with diffuse soft tissue inflammation noted about the lower abdomen and pelvis, tracking about small and large bowel loops. Complex fluid tracks about the bladder and uterus. Vascular/Lymphatic: Scattered calcification is seen along the abdominal aorta and its branches. No definite retroperitoneal lymphadenopathy is seen. Reproductive: The bladder is diffusely thick walled, with a Foley catheter in place. Given the extent of surrounding soft tissue  inflammation, findings are concerning for relatively severe cystitis. Multiple small uterine fibroids are noted. The ovaries are not well characterized. No suspicious adnexal masses are seen. Other: No additional soft tissue abnormalities are seen. Musculoskeletal: No acute osseous abnormalities are identified. The visualized musculature is unremarkable in appearance. IMPRESSION: 1. Diffuse bladder wall thickening, with complex fluid tracking about the bladder and uterus, but no defined abscess at this time. Diffuse soft tissue inflammation extends about the lower abdomen and pelvis. This is concerning for diffuse pelvic infection and cystitis. 2. Mild to moderate bilateral hydronephrosis, with a left-sided nephrostomy tube. Large obstructing left ureteral stone measures 1.9 x 1.0 cm, 2 cm above the left renal pelvis. Right-sided hydronephrosis appears to reflect the underlying bladder process. 3.  Nonobstructing left renal stones measure up to 1.9 cm in size. 4. Mild wall thickening about the proximal sigmoid colon. This may be reactive secondary to diffuse pelvic inflammation. 5. Multiple small uterine fibroids noted. 6. Mild bibasilar atelectasis noted. 7. Scattered aortic atherosclerosis. Electronically Signed   By: Garald Balding M.D.   On: 05/05/2016 01:04    Procedures Procedures (including critical care time)  Medications Ordered in ED Medications - No data to display   Initial Impression / Assessment and Plan / ED Course  I have reviewed the triage vital signs and the nursing notes.  Pertinent labs & imaging results that were available during my care of the patient were reviewed by me and considered in my medical decision making (see chart for details).  Clinical Course    Level V caveat for acuity of condition and altered mental status. Patient from nursing home with decreased mental status, lethargy, hypotension and tachycardia. Has indwelling Foley as well as left nephrostomy. Recent admit for septic shock secondary to UTI and pelvic abscess.  Patient is minimally responsive. She does not follow commands. She is hypotensive in the 50s. IV access is established. Patient given IV fluids, IV antibiotics after cultures obtained. Code sepsis activated.  Blood pressure has improved slightly with fluid administration patient intubated for airway protection. Intubation performed by Dr. Ron Parker.  Patient does have frank pus in her nephrostomy and Foley catheter. She was started on Zosyn and vancomycin after cultures are obtained. Lactate is elevated. Patient also found to be anemic and emergent blood transfusion ordered.s/p 4 liters of saline remains hypotensive, levophed and blood started.  CT does not show drainable abscess. Does show bilateral hydronephrosis and multiple stones on L. Will need urology consult once stabilized.  Unable to contact family. Patient remains hypotensive  in ED. Additional saline, given, levophed titrated and vasopressin added. Admission d/w critical care.  CRITICAL CARE Performed by: Ezequiel Essex Total critical care time:60 minutes Critical care time was exclusive of separately billable procedures and treating other patients. Critical care was necessary to treat or prevent imminent or life-threatening deterioration. Critical care was time spent personally by me on the following activities: development of treatment plan with patient and/or surrogate as well as nursing, discussions with consultants, evaluation of patient's response to treatment, examination of patient, obtaining history from patient or surrogate, ordering and performing treatments and interventions, ordering and review of laboratory studies, ordering and review of radiographic studies, pulse oximetry and re-evaluation of patient's condition.   Final Clinical Impressions(s) / ED Diagnoses   Final diagnoses:  Septic shock ([place])  Anemia, unspecified type  Acute respiratory failure with hypoxia ([facility])    New Prescriptions New Prescriptions   No medications on file  I personally performed the  services described in this documentation, which was scribed in my presence. The recorded information has been reviewed and is accurate.     Ezequiel Essex, MD 05/05/16 [phone]

## 2016-05-04 NOTE — Code Documentation (Signed)
Levophed IV started at 5 mc.

## 2016-05-04 NOTE — Progress Notes (Signed)
As of this time, medications have not been reviewed by pharmacy, LVVM requesting call back for verification as to if they have received orders from [facility] regarding pre op labs, have also left [facility]VM with [facility] at [facility] regarding this orders as per anesthesia protocol

## 2016-05-05 ENCOUNTER — Emergency Department (HOSPITAL_COMMUNITY): Payer: Medicare Other

## 2016-05-05 ENCOUNTER — Inpatient Hospital Stay (HOSPITAL_COMMUNITY): Payer: Medicare Other

## 2016-05-05 DIAGNOSIS — R6521 Severe sepsis with septic shock: Secondary | ICD-10-CM | POA: Diagnosis present

## 2016-05-05 DIAGNOSIS — N111 Chronic obstructive pyelonephritis: Secondary | ICD-10-CM | POA: Diagnosis present

## 2016-05-05 DIAGNOSIS — N390 Urinary tract infection, site not specified: Secondary | ICD-10-CM | POA: Diagnosis not present

## 2016-05-05 DIAGNOSIS — Z936 Other artificial openings of urinary tract status: Secondary | ICD-10-CM | POA: Diagnosis not present

## 2016-05-05 DIAGNOSIS — A419 Sepsis, unspecified organism: Secondary | ICD-10-CM | POA: Diagnosis present

## 2016-05-05 DIAGNOSIS — J9601 Acute respiratory failure with hypoxia: Secondary | ICD-10-CM

## 2016-05-05 DIAGNOSIS — N183 Chronic kidney disease, stage 3 (moderate): Secondary | ICD-10-CM | POA: Diagnosis present

## 2016-05-05 DIAGNOSIS — Z8673 Personal history of transient ischemic attack (TIA), and cerebral infarction without residual deficits: Secondary | ICD-10-CM | POA: Diagnosis not present

## 2016-05-05 DIAGNOSIS — D649 Anemia, unspecified: Secondary | ICD-10-CM | POA: Diagnosis not present

## 2016-05-05 DIAGNOSIS — E872 Acidosis: Secondary | ICD-10-CM | POA: Diagnosis present

## 2016-05-05 DIAGNOSIS — Z79899 Other long term (current) drug therapy: Secondary | ICD-10-CM | POA: Diagnosis not present

## 2016-05-05 DIAGNOSIS — D62 Acute posthemorrhagic anemia: Secondary | ICD-10-CM | POA: Diagnosis not present

## 2016-05-05 DIAGNOSIS — G3289 Other specified degenerative disorders of nervous system in diseases classified elsewhere: Secondary | ICD-10-CM | POA: Diagnosis present

## 2016-05-05 DIAGNOSIS — G35 Multiple sclerosis: Secondary | ICD-10-CM | POA: Diagnosis present

## 2016-05-05 DIAGNOSIS — N179 Acute kidney failure, unspecified: Secondary | ICD-10-CM | POA: Diagnosis present

## 2016-05-05 DIAGNOSIS — Z7901 Long term (current) use of anticoagulants: Secondary | ICD-10-CM | POA: Diagnosis not present

## 2016-05-05 DIAGNOSIS — Z86718 Personal history of other venous thrombosis and embolism: Secondary | ICD-10-CM | POA: Diagnosis not present

## 2016-05-05 DIAGNOSIS — Z87891 Personal history of nicotine dependence: Secondary | ICD-10-CM | POA: Diagnosis not present

## 2016-05-05 DIAGNOSIS — N136 Pyonephrosis: Secondary | ICD-10-CM | POA: Diagnosis not present

## 2016-05-05 DIAGNOSIS — N322 Vesical fistula, not elsewhere classified: Secondary | ICD-10-CM | POA: Diagnosis not present

## 2016-05-05 DIAGNOSIS — N10 Acute tubulo-interstitial nephritis: Secondary | ICD-10-CM | POA: Diagnosis not present

## 2016-05-05 DIAGNOSIS — Z7984 Long term (current) use of oral hypoglycemic drugs: Secondary | ICD-10-CM | POA: Diagnosis not present

## 2016-05-05 DIAGNOSIS — E1122 Type 2 diabetes mellitus with diabetic chronic kidney disease: Secondary | ICD-10-CM | POA: Diagnosis present

## 2016-05-05 DIAGNOSIS — I129 Hypertensive chronic kidney disease with stage 1 through stage 4 chronic kidney disease, or unspecified chronic kidney disease: Secondary | ICD-10-CM | POA: Diagnosis present

## 2016-05-05 DIAGNOSIS — K922 Gastrointestinal hemorrhage, unspecified: Secondary | ICD-10-CM | POA: Diagnosis present

## 2016-05-05 DIAGNOSIS — E876 Hypokalemia: Secondary | ICD-10-CM | POA: Diagnosis present

## 2016-05-05 DIAGNOSIS — R197 Diarrhea, unspecified: Secondary | ICD-10-CM | POA: Diagnosis not present

## 2016-05-05 DIAGNOSIS — E1140 Type 2 diabetes mellitus with diabetic neuropathy, unspecified: Secondary | ICD-10-CM | POA: Diagnosis present

## 2016-05-05 DIAGNOSIS — N12 Tubulo-interstitial nephritis, not specified as acute or chronic: Secondary | ICD-10-CM | POA: Diagnosis not present

## 2016-05-05 DIAGNOSIS — N132 Hydronephrosis with renal and ureteral calculous obstruction: Secondary | ICD-10-CM | POA: Diagnosis not present

## 2016-05-05 LAB — CBC WITH DIFFERENTIAL/PLATELET
BASOS PCT: 0 %
Basophils Absolute: 0 10*3/uL (ref 0.0–0.1)
Basophils Absolute: 0 K/uL (ref 0.0–0.1)
Basophils Relative: 0 %
EOS PCT: 0 %
Eosinophils Absolute: 0 10*3/uL (ref 0.0–0.7)
Eosinophils Absolute: 0 K/uL (ref 0.0–0.7)
Eosinophils Relative: 0 %
HCT: 34.8 % — ABNORMAL LOW (ref 36.0–46.0)
HEMATOCRIT: 14.1 % — AB (ref 36.0–46.0)
HEMOGLOBIN: 4.3 g/dL — AB (ref 12.0–15.0)
Hemoglobin: 11.4 g/dL — ABNORMAL LOW (ref 12.0–15.0)
LYMPHS ABS: 0.4 10*3/uL — AB (ref 0.7–4.0)
Lymphocytes Relative: 7 %
Lymphocytes Relative: 9 %
Lymphs Abs: 0.6 K/uL — ABNORMAL LOW (ref 0.7–4.0)
MCH: 29.3 pg (ref 26.0–34.0)
MCH: 27.7 pg (ref 26.0–34.0)
MCHC: 32.8 g/dL (ref 30.0–36.0)
MCHC: 30.5 g/dL (ref 30.0–36.0)
MCV: 89.5 fL (ref 78.0–100.0)
MCV: 91 fL (ref 78.0–100.0)
MONOS PCT: 4 %
Monocytes Absolute: 0.2 10*3/uL (ref 0.1–1.0)
Monocytes Absolute: 0.4 K/uL (ref 0.1–1.0)
Monocytes Relative: 4 %
NEUTROS ABS: 4 10*3/uL (ref 1.7–7.7)
Neutro Abs: 7.8 K/uL — ABNORMAL HIGH (ref 1.7–7.7)
Neutrophils Relative %: 87 %
Neutrophils Relative %: 89 %
Platelets: 178 10*3/uL (ref 150–400)
Platelets: 258 K/uL (ref 150–400)
RBC: 3.89 MIL/uL (ref 3.87–5.11)
RBC: 1.55 MIL/uL — AB (ref 3.87–5.11)
RDW: 15.8 % — ABNORMAL HIGH (ref 11.5–15.5)
RDW: 16.9 % — ABNORMAL HIGH (ref 11.5–15.5)
WBC Morphology: INCREASED
WBC Morphology: INCREASED
WBC: 4.6 10*3/uL (ref 4.0–10.5)
WBC: 8.8 K/uL (ref 4.0–10.5)

## 2016-05-05 LAB — BLOOD GAS, ARTERIAL
ACID-BASE DEFICIT: 11.9 mmol/L — AB (ref 0.0–2.0)
Bicarbonate: 12.2 mmol/L — ABNORMAL LOW (ref 20.0–28.0)
DRAWN BY: 225631
FIO2: 50
O2 SAT: 99.5 %
PEEP/CPAP: 5 cmH2O
PH ART: 7.395 (ref 7.350–7.450)
Patient temperature: 98.6
RATE: 24 resp/min
VT: 0.5 mL
pCO2 arterial: 20.3 mmHg — ABNORMAL LOW (ref 32.0–48.0)
pO2, Arterial: 209 mmHg — ABNORMAL HIGH (ref 83.0–108.0)

## 2016-05-05 LAB — BASIC METABOLIC PANEL
ANION GAP: 11 (ref 5–15)
Anion gap: 14 (ref 5–15)
Anion gap: 12 (ref 5–15)
BUN: 57 mg/dL — AB (ref 6–20)
BUN: 55 mg/dL — AB (ref 6–20)
BUN: 52 mg/dL — ABNORMAL HIGH (ref 6–20)
CALCIUM: 8.2 mg/dL — AB (ref 8.9–10.3)
CALCIUM: 8.4 mg/dL — AB (ref 8.9–10.3)
CO2: 13 mmol/L — ABNORMAL LOW (ref 22–32)
CO2: 15 mmol/L — AB (ref 22–32)
CO2: 17 mmol/L — ABNORMAL LOW (ref 22–32)
Calcium: 8.4 mg/dL — ABNORMAL LOW (ref 8.9–10.3)
Chloride: 112 mmol/L — ABNORMAL HIGH (ref 101–111)
Chloride: 110 mmol/L (ref 101–111)
Chloride: 112 mmol/L — ABNORMAL HIGH (ref 101–111)
Creatinine, Ser: 2.78 mg/dL — ABNORMAL HIGH (ref 0.44–1.00)
Creatinine, Ser: 2.17 mg/dL — ABNORMAL HIGH (ref 0.44–1.00)
Creatinine, Ser: 1.89 mg/dL — ABNORMAL HIGH (ref 0.44–1.00)
GFR calc Af Amer: 21 mL/min — ABNORMAL LOW (ref >60)
GFR calc Af Amer: 28 mL/min — ABNORMAL LOW (ref >60)
GFR, EST AFRICAN AMERICAN: 33 mL/min — AB (ref >60)
GFR, EST NON AFRICAN AMERICAN: 18 mL/min — AB (ref >60)
GFR, EST NON AFRICAN AMERICAN: 24 mL/min — AB (ref >60)
GFR, EST NON AFRICAN AMERICAN: 28 mL/min — AB (ref >60)
GLUCOSE: 176 mg/dL — AB (ref 65–99)
GLUCOSE: 190 mg/dL — AB (ref 65–99)
GLUCOSE: 200 mg/dL — AB (ref 65–99)
POTASSIUM: 3 mmol/L — AB (ref 3.5–5.1)
POTASSIUM: 3.1 mmol/L — AB (ref 3.5–5.1)
Potassium: 3.7 mmol/L (ref 3.5–5.1)
SODIUM: 140 mmol/L (ref 135–145)
Sodium: 139 mmol/L (ref 135–145)
Sodium: 137 mmol/L (ref 135–145)

## 2016-05-05 LAB — TROPONIN I
TROPONIN I: 0.17 ng/mL — AB (ref <0.03)
TROPONIN I: 0.18 ng/mL — AB (ref <0.03)

## 2016-05-05 LAB — PROTIME-INR
INR: 2.37
INR: 5.2
Prothrombin Time: 26.4 seconds — ABNORMAL HIGH (ref 11.4–15.2)
Prothrombin Time: 49.4 seconds — ABNORMAL HIGH (ref 11.4–15.2)

## 2016-05-05 LAB — BLOOD CULTURE ID PANEL (REFLEXED)
Acinetobacter baumannii: NOT DETECTED
CANDIDA ALBICANS: NOT DETECTED
CANDIDA GLABRATA: NOT DETECTED
CANDIDA KRUSEI: NOT DETECTED
CANDIDA PARAPSILOSIS: NOT DETECTED
CANDIDA TROPICALIS: NOT DETECTED
ENTEROBACTER CLOACAE COMPLEX: NOT DETECTED
ENTEROBACTERIACEAE SPECIES: NOT DETECTED
Enterococcus species: NOT DETECTED
Escherichia coli: NOT DETECTED
Haemophilus influenzae: NOT DETECTED
KLEBSIELLA OXYTOCA: NOT DETECTED
KLEBSIELLA PNEUMONIAE: NOT DETECTED
Listeria monocytogenes: NOT DETECTED
Methicillin resistance: DETECTED — AB
NEISSERIA MENINGITIDIS: NOT DETECTED
PROTEUS SPECIES: NOT DETECTED
Pseudomonas aeruginosa: NOT DETECTED
STAPHYLOCOCCUS SPECIES: DETECTED — AB
STREPTOCOCCUS PYOGENES: NOT DETECTED
STREPTOCOCCUS SPECIES: NOT DETECTED
Serratia marcescens: NOT DETECTED
Staphylococcus aureus (BCID): NOT DETECTED
Streptococcus agalactiae: NOT DETECTED
Streptococcus pneumoniae: NOT DETECTED

## 2016-05-05 LAB — URINALYSIS, ROUTINE W REFLEX MICROSCOPIC
Bilirubin Urine: NEGATIVE
Glucose, UA: NEGATIVE mg/dL
Ketones, ur: 15 mg/dL — AB
Nitrite: NEGATIVE
Protein, ur: 100 mg/dL — AB
Specific Gravity, Urine: 1.013 (ref 1.005–1.030)
pH: 6 (ref 5.0–8.0)

## 2016-05-05 LAB — I-STAT ARTERIAL BLOOD GAS, ED
ACID-BASE DEFICIT: 11 mmol/L — AB (ref 0.0–2.0)
BICARBONATE: 15.1 mmol/L — AB (ref 20.0–28.0)
O2 Saturation: 100 %
PO2 ART: 447 mmHg — AB (ref 83.0–108.0)
Patient temperature: 98.6
TCO2: 16 mmol/L (ref 0–100)
pCO2 arterial: 34.3 mmHg (ref 32.0–48.0)
pH, Arterial: 7.251 — ABNORMAL LOW (ref 7.350–7.450)

## 2016-05-05 LAB — CBC
HCT: 33.3 % — ABNORMAL LOW (ref 36.0–46.0)
Hemoglobin: 11.2 g/dL — ABNORMAL LOW (ref 12.0–15.0)
MCH: 29.6 pg (ref 26.0–34.0)
MCHC: 33.6 g/dL (ref 30.0–36.0)
MCV: 88.1 fL (ref 78.0–100.0)
PLATELETS: 310 10*3/uL (ref 150–400)
RBC: 3.78 MIL/uL — ABNORMAL LOW (ref 3.87–5.11)
RDW: 15.7 % — AB (ref 11.5–15.5)
WBC: 10.2 10*3/uL (ref 4.0–10.5)

## 2016-05-05 LAB — LACTIC ACID, PLASMA
LACTIC ACID, VENOUS: 4.4 mmol/L — AB (ref 0.5–1.9)
LACTIC ACID, VENOUS: 3.7 mmol/L — AB (ref 0.5–1.9)
Lactic Acid, Venous: 4.3 mmol/L (ref 0.5–1.9)

## 2016-05-05 LAB — URINE MICROSCOPIC-ADD ON

## 2016-05-05 LAB — COMPREHENSIVE METABOLIC PANEL
ALK PHOS: 37 U/L — AB (ref 38–126)
ALT: 17 U/L (ref 14–54)
ANION GAP: 14 (ref 5–15)
AST: 37 U/L (ref 15–41)
Albumin: 2.2 g/dL — ABNORMAL LOW (ref 3.5–5.0)
BUN: 63 mg/dL — ABNORMAL HIGH (ref 6–20)
CO2: 14 mmol/L — AB (ref 22–32)
Calcium: 8.5 mg/dL — ABNORMAL LOW (ref 8.9–10.3)
Chloride: 111 mmol/L (ref 101–111)
Creatinine, Ser: 3.2 mg/dL — ABNORMAL HIGH (ref 0.44–1.00)
GFR calc Af Amer: 17 mL/min — ABNORMAL LOW (ref >60)
GFR calc non Af Amer: 15 mL/min — ABNORMAL LOW (ref >60)
Glucose, Bld: 161 mg/dL — ABNORMAL HIGH (ref 65–99)
POTASSIUM: 5 mmol/L (ref 3.5–5.1)
Sodium: 139 mmol/L (ref 135–145)
Total Bilirubin: 1.3 mg/dL — ABNORMAL HIGH (ref 0.3–1.2)
Total Protein: 5.3 g/dL — ABNORMAL LOW (ref 6.5–8.1)

## 2016-05-05 LAB — C DIFFICILE QUICK SCREEN W PCR REFLEX
C Diff antigen: NEGATIVE
C Diff interpretation: NOT DETECTED
C Diff toxin: NEGATIVE

## 2016-05-05 LAB — PHOSPHORUS: Phosphorus: 3.4 mg/dL (ref 2.5–4.6)

## 2016-05-05 LAB — MAGNESIUM: MAGNESIUM: 1.3 mg/dL — AB (ref 1.7–2.4)

## 2016-05-05 LAB — CORTISOL: Cortisol, Plasma: 54.4 ug/dL

## 2016-05-05 LAB — PREPARE RBC (CROSSMATCH)

## 2016-05-05 LAB — POC OCCULT BLOOD, ED: Fecal Occult Bld: POSITIVE — AB

## 2016-05-05 MED ORDER — SODIUM CHLORIDE 0.9 % IV BOLUS (SEPSIS)
1000.0000 mL | Freq: Once | INTRAVENOUS | Status: AC
  Administered 2016-05-05: 1000 mL via INTRAVENOUS

## 2016-05-05 MED ORDER — SODIUM CHLORIDE 0.9 % IV SOLN: 10.0000 mL/h | Freq: Once | INTRAVENOUS | Status: DC

## 2016-05-05 MED ORDER — FENTANYL CITRATE (PF) 100 MCG/2ML IJ SOLN: 50.0000 ug | Freq: Once | INTRAMUSCULAR | Status: DC

## 2016-05-05 MED ORDER — SODIUM CHLORIDE 0.9 % IV SOLN: 250.0000 mL | INTRAVENOUS | Status: DC | PRN

## 2016-05-05 MED ORDER — POTASSIUM CHLORIDE 20 MEQ/15ML (10%) PO SOLN
40.0000 meq | Freq: Once | ORAL | Status: AC
  Administered 2016-05-05: 40 meq
  Filled 2016-05-05: qty 30

## 2016-05-05 MED ORDER — VASOPRESSIN 20 UNIT/ML IV SOLN
0.0300 [IU]/min | INTRAVENOUS | Status: DC
  Administered 2016-05-05: 0.03 [IU]/min via INTRAVENOUS
  Filled 2016-05-05: qty 2

## 2016-05-05 MED ORDER — PIPERACILLIN-TAZOBACTAM 3.375 G IVPB
3.3750 g | Freq: Three times a day (TID) | INTRAVENOUS | Status: DC
  Administered 2016-05-05 – 2016-05-07 (×7): 3.375 g via INTRAVENOUS
  Filled 2016-05-05 (×10): qty 50

## 2016-05-05 MED ORDER — VANCOMYCIN HCL IN DEXTROSE 750-5 MG/150ML-% IV SOLN
750.0000 mg | Freq: Once | INTRAVENOUS | Status: DC
  Filled 2016-05-05: qty 150

## 2016-05-05 MED ORDER — FENTANYL BOLUS VIA INFUSION
50.0000 ug | INTRAVENOUS | Status: DC | PRN
  Administered 2016-05-07: 50 ug via INTRAVENOUS
  Filled 2016-05-05: qty 50

## 2016-05-05 MED ORDER — FENTANYL 2500MCG IN NS 250ML (10MCG/ML) PREMIX INFUSION
25.0000 ug/h | INTRAVENOUS | Status: DC
  Administered 2016-05-06: 50 ug/h via INTRAVENOUS
  Filled 2016-05-05: qty 250

## 2016-05-05 MED ORDER — MIDAZOLAM HCL 2 MG/2ML IJ SOLN
2.0000 mg | INTRAMUSCULAR | Status: DC | PRN
  Administered 2016-05-08: 2 mg via INTRAVENOUS
  Filled 2016-05-05 (×2): qty 2

## 2016-05-05 MED ORDER — FAMOTIDINE IN NACL 20-0.9 MG/50ML-% IV SOLN: 20.0000 mg | Freq: Two times a day (BID) | INTRAVENOUS | Status: DC

## 2016-05-05 MED ORDER — HYDROCORTISONE NA SUCCINATE PF 100 MG IJ SOLR
50.0000 mg | Freq: Four times a day (QID) | INTRAMUSCULAR | Status: DC
  Administered 2016-05-05 – 2016-05-07 (×10): 50 mg via INTRAVENOUS
  Filled 2016-05-05 (×10): qty 2

## 2016-05-05 MED ORDER — CHLORHEXIDINE GLUCONATE 0.12% ORAL RINSE (MEDLINE KIT)
15.0000 mL | Freq: Two times a day (BID) | OROMUCOSAL | Status: DC
  Administered 2016-05-05 – 2016-05-08 (×6): 15 mL via OROMUCOSAL

## 2016-05-05 MED ORDER — ORAL CARE MOUTH RINSE
15.0000 mL | Freq: Four times a day (QID) | OROMUCOSAL | Status: DC
  Administered 2016-05-05 – 2016-05-08 (×12): 15 mL via OROMUCOSAL

## 2016-05-05 MED ORDER — SODIUM CHLORIDE 0.9 % IV BOLUS (SEPSIS)
1000.0000 mL | Freq: Once | INTRAVENOUS | Status: AC
  Administered 2016-05-06: 1000 mL via INTRAVENOUS

## 2016-05-05 MED ORDER — SODIUM CHLORIDE 0.9 % IV SOLN
INTRAVENOUS | Status: DC
  Administered 2016-05-05 – 2016-05-08 (×7): via INTRAVENOUS
  Administered 2016-05-09: 1000 mL via INTRAVENOUS

## 2016-05-05 MED ORDER — SODIUM CHLORIDE 0.9 % IV BOLUS (SEPSIS): 1000.0000 mL | Freq: Once | INTRAVENOUS | Status: DC

## 2016-05-05 MED ORDER — DEXTROSE 5 % IV SOLN
0.0000 ug/min | INTRAVENOUS | Status: DC
  Administered 2016-05-05: 2 ug/min via INTRAVENOUS
  Administered 2016-05-06: 6 ug/min via INTRAVENOUS
  Filled 2016-05-05 (×3): qty 4

## 2016-05-05 MED ORDER — MIDAZOLAM HCL 2 MG/2ML IJ SOLN: 2.0000 mg | INTRAMUSCULAR | Status: DC | PRN

## 2016-05-05 MED ORDER — VANCOMYCIN HCL IN DEXTROSE 750-5 MG/150ML-% IV SOLN
750.0000 mg | INTRAVENOUS | Status: DC
  Administered 2016-05-06: 750 mg via INTRAVENOUS
  Filled 2016-05-05 (×2): qty 150

## 2016-05-05 MED ORDER — NOREPINEPHRINE BITARTRATE 1 MG/ML IV SOLN
0.0000 ug/min | Freq: Once | INTRAVENOUS | Status: AC
  Administered 2016-05-04: 35 ug/min via INTRAVENOUS
  Filled 2016-05-05 (×2): qty 4

## 2016-05-05 MED ORDER — FAMOTIDINE IN NACL 20-0.9 MG/50ML-% IV SOLN
20.0000 mg | INTRAVENOUS | Status: DC
  Administered 2016-05-05 – 2016-05-06 (×2): 20 mg via INTRAVENOUS
  Filled 2016-05-05 (×2): qty 50

## 2016-05-05 MED ORDER — NOREPINEPHRINE BITARTRATE 1 MG/ML IV SOLN
0.0000 ug/min | Freq: Once | INTRAVENOUS | Status: AC
  Administered 2016-05-05: 40 ug/min via INTRAVENOUS

## 2016-05-05 NOTE — Progress Notes (Addendum)
   Sepsis - Repeat Assessment  Performed at:    05/05/2016 7:18 PM   Vitals     Blood pressure 109/73, pulse 93, temperature (!) 100.4 F (38 C), temperature source Oral, resp. rate (!) 24, height 5\' 6"  (1.676 m), weight 68.4 kg (150 lb 12.7 oz), SpO2 100 %.  Heart:     86/min  Lungs:    not done  Capillary Refill:   Unclear from camera  Peripheral Pulse:   unclear  Skin:     Unclear from camera   Recent Labs Lab 05/05/16 0226 05/05/16 0555 05/05/16 1730  LATICACIDVEN 4.4* 4.3* 3.7*     Recent Labs Lab 05/05/16 0032 05/05/16 0300 05/05/16 1555 05/05/16 1730  NA 139 139 137 140  K 5.0 3.7 3.0* 3.1*  CL 111 112* 110 112*  CO2 14* 13* 15* 17*  GLUCOSE 161* 176* 190* 200*  BUN 63* 57* 55* 52*  CREATININE 3.20* 2.78* 2.17* 1.89*  CALCIUM 8.5* 8.2* 8.4* 8.4*  MG  --  1.3*  --   --   PHOS  --  3.4  --   --     A Septic shock - on levophed/vasop Improving lactic acidosis and AKI diahrrea 0- c diff pending Low K -   P 1L fluid bolus Await c diff KCL 81meq viat ube  Dr. Brand Males, M.D., [facility].C.P Pulmonary and Critical Care Medicine Staff Physician [facility] Pager: [phone], If no answer or between  15:00h - 7:00h: call [phone]  05/05/2016 7:20 PM

## 2016-05-05 NOTE — Progress Notes (Signed)
Pt. transported from ED-Trauma-B to CT and back uneventfully.

## 2016-05-05 NOTE — Progress Notes (Signed)
[facility] Progress Note Patient Name: [name] DOB: May 15, 1961 MRN: [id number]   Date of Service  05/05/2016  HPI/Events of Note  55 yo NH patient with septic shock, acute resp failure encephalopathy from severe acidosis, LA 3 Given 4 lL saline, on vasopressors, will need to consider GI source of infection, pyelonephritis ER notes pUS from foley and Nephrostomy tubes  eICU Interventions  Admit to ICU, vent support, vasopressors, IVF's and IV abx     Intervention Category Evaluation Type: New Patient Evaluation  [name] 05/05/2016, 12:53 AM

## 2016-05-05 NOTE — Progress Notes (Signed)
CRITICAL VALUE ALERT  Critical value received:  Lactic acid 3.7  Date of notification:  05/05/16  Time of notification:  19:22  Critical value read back: Yes  Nurse who received alert:  E. Velta Addison  MD notified (1st page):  CCM via elink  Time of first page:  19:22  MD notified (2nd page):  Time of second page:  Responding MD: CCM  Time MD responded: pending

## 2016-05-05 NOTE — Progress Notes (Signed)
Pt. transported from ED-Trauma B to 3MW-04 uneventfully, RT receiving pt. made aware.

## 2016-05-05 NOTE — H&P (Signed)
PULMONARY / CRITICAL CARE MEDICINE   Name: [name] MRN: [id number] DOB: Jan 15, 1961    ADMISSION DATE:  05/04/2016 CONSULTATION DATE:  05/05/2016  REFERRING MD:  Dr. Wyvonnia Dusky EDP  CHIEF COMPLAINT:  AMS  HISTORY OF PRESENT ILLNESS:   55 year old female with PMH as below, which is significant for MS, CKD III, DM, GERD, HTN, nephrolithiasis, sacral decub, and neuromuscular disorder of bladder. She was recently admitted to [facility] when she presented with septic shock with concern for urinary source vs pelvic abscess. She has a chronic indwelling nephrostomy tube and bilateral hydronephrosis due to obstructing urinary calculi. She was evaluated by urology, cystoscopy did not reveal any evidence of fistula. Plan was for her to undergo nephrolithotomy on 11/30 ( dahlstedt). She was discharged to [facility] with 2 weeks of antibiotics while PICC line, which was discontinued on 11/21.  She was brought in by EMS on 11/22 with altered mental status in the form of lethargy for one day and no urine output. Frank pus was noted both in the Foley and nephrostomy tube, her Foley was coated and feces, she was hypotensive and sepsis protocol was initiated. She was intubated for airway protection, given Zosyn and vancomycin. Initial hemoglobin was 4 but on repeat this was noted to be 11  PAST MEDICAL HISTORY :  She  has a past medical history of Acute renal failure superimposed on stage 3 chronic kidney disease ([place]) (07/21/2015); Anemia; Diabetes mellitus without complication ([place]); Diabetic neuropathy (H[facility]); DVT (deep venous thrombosis) ([place]); DVT (deep venous thrombosis) ([place]); GERD (gastroesophageal reflux disease); sepsis; Hypertension; Left nephrolithiasis; Leukocytosis; Lymph edema; Malnutrition of moderate degree (07/21/2015); Multiple sclerosis diagnosed 2002-not on Therapy any longer (06/26/2012); Muscle weakness (generalized); Neuromuscular disorder ([place]); Neuromuscular dysfunction of bladder;  Polyneuropathy ([place]); Presence of indwelling urinary catheter; Protein calorie malnutrition ([place]); Stage 4 decubitus ulcer ([place]) (07/05/2015); Stroke [facility]); and UTI (lower urinary tract infection).  PAST SURGICAL HISTORY: She  has a past surgical history that includes Hernia mesh removal; tubes tided; Tubal ligation; Esophagogastroduodenoscopy (Left, 01/02/2015); ir generic historical (02/23/2016); NEPHROSTOMY TUBE PLACEMENT ([place] HX); Transurethral resection of bladder tumor (N/A, 03/16/2016); and ir generic historical (04/19/2016).  No Known Allergies  Current Facility-Administered Medications on File Prior to Encounter  Medication  . cefTRIAXone (ROCEPHIN) injection 1 g   Current Outpatient Prescriptions on File Prior to Encounter  Medication Sig  . acetaminophen (TYLENOL) 325 MG tablet Take 2 tablets (650 mg total) by mouth every 6 (six) hours as needed for fever.  Marland Kitchen albuterol (PROVENTIL) (2.5 MG/3ML) 0.083% nebulizer solution Take 3 mLs (2.5 mg total) by nebulization every 6 (six) hours as needed for wheezing or shortness of breath.  . Amino Acids-Protein Hydrolys (FEEDING SUPPLEMENT, PRO-STAT SUGAR FREE 64,) LIQD Take 30 mLs by mouth 2 (two) times daily.  Marland Kitchen atorvastatin (LIPITOR) 10 MG tablet Take 10 mg by mouth at bedtime.   . baclofen (LIORESAL) 10 MG tablet Take 1 tablet (10 mg total) by mouth 3 (three) times daily.  . famotidine (PEPCID) 20 MG tablet Take 1 tablet (20 mg total) by mouth 2 (two) times daily.  . furosemide (LASIX) 20 MG tablet Take 40 mg by mouth 2 (two) times daily.   Marland Kitchen gabapentin (NEURONTIN) 100 MG capsule Take 1 capsule (100 mg total) by mouth at bedtime.  Marland Kitchen lisinopril (PRINIVIL,ZESTRIL) 2.5 MG tablet Take 2.5 mg by mouth daily.   . metFORMIN (GLUCOPHAGE) 500 MG tablet Take 0.5 tablets (250 mg total) by mouth 2 (two) times  daily.  . Multiple Vitamin (MULTIVITAMIN WITH MINERALS) TABS tablet Take 1 tablet by mouth daily.  . polyethylene glycol (MIRALAX / GLYCOLAX) packet  Take 17 g by mouth 2 (two) times daily.  . potassium chloride (K-DUR,KLOR-CON) 10 MEQ tablet Take 1 tablet (10 mEq total) by mouth daily.  . rivaroxaban (XARELTO) 20 MG TABS tablet Take 1 tablet (20 mg total) by mouth daily with supper. Reported on 08/12/2015  . senna-docusate (SENOKOT-S) 8.6-50 MG tablet Take 1 tablet by mouth 2 (two) times daily.  . solifenacin (VESICARE) 5 MG tablet Take 1 tablet (5 mg total) by mouth daily.    FAMILY HISTORY:  Her indicated that her mother is alive. She indicated that her father is alive.    SOCIAL HISTORY: She  reports that she quit smoking about 17 months ago. Her smoking use included Cigarettes. She has a 20.00 pack-year smoking history. She has never used smokeless tobacco. She reports that she drinks about 3.0 oz of alcohol per week . She reports that she does not use drugs.  REVIEW OF SYSTEMS:   Unable to obtain due to altered mental status  SUBJECTIVE:    VITAL SIGNS: BP 90/67   Pulse 119   Temp 97 F (36.1 C)   Resp 24   Ht 5\' 6"  (1.676 m)   Wt 43.5 kg (96 lb)   SpO2 96%   BMI 15.49 kg/m   HEMODYNAMICS:    VENTILATOR SETTINGS: Vent Mode: PRVC FiO2 (%):  [60 %-100 %] 60 % Set Rate:  [16 bmp-24 bmp] 24 bmp Vt Set:  [500 mL] 500 mL PEEP:  [5 cmH20] 5 cmH20 Plateau Pressure:  [18 cmH20] 18 cmH20  INTAKE / OUTPUT: No intake/output data recorded.  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  Electrolytes No results for input(s): CALCIUM, MG,  PHOS in the last 168 hours.  CBC  Recent Labs Lab 05/04/16 2339  WBC 4.6  HGB 4.3*  HCT 14.1*  PLT 178    Coag's  Recent Labs Lab 05/04/16 2339  INR 5.20*    Sepsis Markers  Recent Labs Lab 05/04/16 2344  LATICACIDVEN 3.23*    ABG  Recent Labs Lab 05/05/16 0020  PHART 7.251*  PCO2ART 34.3  PO2ART 447.0*    Liver Enzymes No results for input(s): AST, ALT, ALKPHOS, BILITOT, ALBUMIN in the last 168 hours.  Cardiac Enzymes No results for input(s): TROPONINI, PROBNP in the last 168 hours.  Glucose No results for input(s): GLUCAP in the last 168 hours.  Imaging Dg Chest Port 1 View  Result Date: 05/05/2016 CLINICAL DATA:  Endotracheal tube adjustment EXAM: PORTABLE CHEST 1 VIEW COMPARISON:  05/04/2016 FINDINGS: Endotracheal tube has been pulled back but tip is still low, measuring 1.3 cm above the carina. Consider withdrawing the tube another 1-2 cm. Enteric tube is unchanged in position. Shallow inspiration. Normal heart size and pulmonary vascularity. Lungs are clear. IMPRESSION: Endotracheal tube has been pulled back of the right mainstem bronchus but remains low, measuring only about 1.3 cm above the carina. Consider withdrawing the tube another  1-2 cm. Electronically Signed   By: Lucienne Capers M.D.   On: 05/05/2016 00:30   Dg Chest Port 1 View  Result Date: 05/05/2016 CLINICAL DATA:  Endotracheal tube placement EXAM: PORTABLE CHEST 1 VIEW COMPARISON:  03/19/2016 FINDINGS: Endotracheal tube has been placed with tip only about 3 mm above the carina and directed to the mainstem bronchus on the right. A subsequent image has been obtained after repositioning of the tube. Enteric tube tip is off the field of view but below the left hemidiaphragm. Shallow inspiration. Heart size and pulmonary vascularity are normal. Lungs are clear. IMPRESSION: Endotracheal tube tip is at the carina directed towards the right mainstem bronchus. Shallow inspiration. Electronically  Signed   By: Lucienne Capers M.D.   On: 05/05/2016 00:27   Ct Renal Stone Study  Result Date: 05/05/2016 CLINICAL DATA:  Acute onset of septic shock.  Initial encounter. EXAM: CT ABDOMEN AND PELVIS WITHOUT CONTRAST TECHNIQUE: Multidetector CT imaging of the abdomen and pelvis was performed following the standard protocol without IV contrast. COMPARISON:  CT of the abdomen and pelvis from 04/19/2016 FINDINGS: Lower chest: Mild bibasilar atelectasis is noted. Trace pericardial fluid likely remains within normal limits. Hepatobiliary: The liver is unremarkable in appearance. The gallbladder is unremarkable in appearance. The common bile duct remains normal in caliber. Pancreas: The pancreas is within normal limits. Spleen: The spleen is unremarkable in appearance. Adrenals/Urinary Tract: The adrenal glands are unremarkable in appearance. The kidneys are within normal limits. There is no evidence of hydronephrosis. No renal or ureteral stones are identified. No perinephric stranding is seen. Mild to moderate bilateral hydronephrosis is noted. A left-sided nephrostomy tube is noted. Two large left renal stones measure up to 1.9 cm in size. Nonspecific perinephric stranding is noted bilaterally. A large obstructing left ureteral stone is noted, measuring 1.9 x 1.0 cm, 2 cm above the left renal pelvis. Stomach/Bowel: The patient's enteric tube is noted ending at the antrum of the stomach. The stomach is grossly unremarkable in appearance. The small bowel is unremarkable. The appendix is normal in caliber, without evidence of appendicitis. The colon is partially filled with stool. There is mild wall thickening along the proximal sigmoid colon, with diffuse soft tissue inflammation noted about the lower abdomen and pelvis, tracking about small and large bowel loops. Complex fluid tracks about the bladder and uterus. Vascular/Lymphatic: Scattered calcification is seen along the abdominal aorta and its branches. No  definite retroperitoneal lymphadenopathy is seen. Reproductive: The bladder is diffusely thick walled, with a Foley catheter in place. Given the extent of surrounding soft tissue inflammation, findings are concerning for relatively severe cystitis. Multiple small uterine fibroids are noted. The ovaries are not well characterized. No suspicious adnexal masses are seen. Other: No additional soft tissue abnormalities are seen. Musculoskeletal: No acute osseous abnormalities are identified. The visualized musculature is unremarkable in appearance. IMPRESSION: 1. Diffuse bladder wall thickening, with complex fluid tracking about the bladder and uterus, but no defined abscess at this time. Diffuse soft tissue inflammation extends about the lower abdomen and pelvis. This is concerning for diffuse pelvic infection and cystitis. 2. Mild to moderate bilateral hydronephrosis, with a left-sided nephrostomy tube. Large obstructing left ureteral stone measures 1.9 x 1.0 cm, 2 cm above the left renal pelvis. Right-sided hydronephrosis appears to reflect the underlying bladder process. 3. Nonobstructing left renal stones measure up to 1.9 cm in size. 4. Mild wall thickening about the proximal sigmoid colon. This may be reactive secondary to diffuse pelvic inflammation.  5. Multiple small uterine fibroids noted. 6. Mild bibasilar atelectasis noted. 7. Scattered aortic atherosclerosis. Electronically Signed   By: Garald Balding M.D.   On: 05/05/2016 01:04     STUDIES:  CT renal stone study 11/22 >> Diffuse bladder wall thickening, with complex fluid tracking about the bladder and uterus, but no defined abscess at this time. Diffuse soft tissue inflammation extends about the lower abdomen and pelvis. This is concerning for diffuse pelvic infection and cystitis. 2. Mild to moderate bilateral hydronephrosis, with a left-sided nephrostomy tube. Large obstructing left ureteral stone measures 1.9 x 1.0 cm, 2 cm above the left  renal pelvis. Right-sided hydronephrosis appears to reflect the underlying bladder process. 3. Nonobstructing left renal stones measure up to 1.9 cm in size.  CULTURES: 11/22 urine >> 11/22 blood >>  ANTIBIOTICS: 11/22 zosyn 11/22 vanc >>  SIGNIFICANT EVENTS: 11/22 admit   LINES/TUBES: 11/22 ETT >>  DISCUSSION: Appears to be yet another episode of septic shock, frank pus from nephrostomy indicates that as most likely source She is intubated for airway protection, initial hemoglobin appears to be false-no evidence of upper GI bleed on NG aspirate but note stool positive for occult blood  ASSESSMENT / PLAN:  PULMONARY A: Acute respiratory failure Intubated for airway protection P:   Vent settings reviewed and adjusted  CARDIOVASCULAR A:  Septic shock P:  Levophed Add vasopressin Central line will be placed Cycle troponins and lactate  RENAL A:   AKI Obstructive hydronephrosis/ pyonephrosis s/p left nephrostomy tube P:   Will need urology consult again once stabilized Watch electrolytes, no acute indication for dialysis at this time-will follow renal function  GASTROINTESTINAL A:   UGIB- Stool occult blood positive P:   Serial hemoglobin   HEMATOLOGIC A:   Chronic DVT, last study 02/2015 P:  Hold Xarelto for now-given concern for upper GI bleed  INFECTIOUS A:   Pyonephrosis -  sensitive Pseudomonas isolated in 01/2016  P:   Zosyn/vancomycin Follow cultures and simplify as needed  ENDOCRINE A:   At risk for relative adrenal insufficiency   P:   Stress dose steroids   NEUROLOGIC A:   Multiple sclerosis  P:   RASS goal: 0 fentanyl when necessary  FAMILY  - Updates: No family at bedside  - Inter-disciplinary family meet or Palliative Care meeting due by:  day 7   The patient is critically ill with multiple organ systems failure and requires high complexity decision making for assessment and support, frequent evaluation and titration of  therapies, application of advanced monitoring technologies and extensive interpretation of multiple databases. Critical Care Time devoted to patient care services described in this note independent of APP time is 60 minutes.   Kara Mead MD. [place] Flood. [place] [facility] Pager [phone] If no response call [phone]    05/05/2016, 2:05 AM

## 2016-05-05 NOTE — ED Notes (Signed)
Elevated CG-4 result reported to Dr. Wyvonnia Dusky

## 2016-05-05 NOTE — Progress Notes (Addendum)
  PHARMACY - PHYSICIAN COMMUNICATION CRITICAL VALUE ALERT - BLOOD CULTURE IDENTIFICATION (BCID)  Results for orders placed or performed during the hospital encounter of 05/04/16  Blood Culture ID Panel (Reflexed) (Collected: 05/04/2016 11:49 PM)  Result Value Ref Range   Enterococcus species NOT DETECTED NOT DETECTED   Listeria monocytogenes NOT DETECTED NOT DETECTED   Staphylococcus species DETECTED (A) NOT DETECTED   Staphylococcus aureus NOT DETECTED NOT DETECTED   Methicillin resistance DETECTED (A) NOT DETECTED   Streptococcus species NOT DETECTED NOT DETECTED   Streptococcus agalactiae NOT DETECTED NOT DETECTED   Streptococcus pneumoniae NOT DETECTED NOT DETECTED   Streptococcus pyogenes NOT DETECTED NOT DETECTED   Acinetobacter baumannii NOT DETECTED NOT DETECTED   Enterobacteriaceae species NOT DETECTED NOT DETECTED   Enterobacter cloacae complex NOT DETECTED NOT DETECTED   Escherichia coli NOT DETECTED NOT DETECTED   Klebsiella oxytoca NOT DETECTED NOT DETECTED   Klebsiella pneumoniae NOT DETECTED NOT DETECTED   Proteus species NOT DETECTED NOT DETECTED   Serratia marcescens NOT DETECTED NOT DETECTED   Haemophilus influenzae NOT DETECTED NOT DETECTED   Neisseria meningitidis NOT DETECTED NOT DETECTED   Pseudomonas aeruginosa NOT DETECTED NOT DETECTED   Candida albicans NOT DETECTED NOT DETECTED   Candida glabrata NOT DETECTED NOT DETECTED   Candida krusei NOT DETECTED NOT DETECTED   Candida parapsilosis NOT DETECTED NOT DETECTED   Candida tropicalis NOT DETECTED NOT DETECTED    Already on vancomycin and zosyn D#2 for infection with possible GI/UTI source. Now with GPC in clusters in aerobic bottle x1 (BCID MRSE).  Name of physician (or Provider) Contacted: Paged [place], no response  Changes to prescribed antibiotics required: none for now, already on vanc/zosyn   Elicia Lamp, PharmD, BCPS Clinical Pharmacist 05/05/2016 9:55 PM

## 2016-05-05 NOTE — ED Notes (Signed)
Levophed increased to 15 mcg.

## 2016-05-05 NOTE — Progress Notes (Signed)
CRITICAL VALUE ALERT  Critical value received:  Troponin 0.17  Date of notification:  05/05/16  Time of notification:  10:05  Critical value read back: yes  Nurse who received alert:  Velta Addison, RN  MD notified (1st page):  [place], NP  Time of first page:  10:05  MD notified (2nd page):  Time of second page:  Responding MD:  Susanne Borders, NP  Time MD responded:  10:05

## 2016-05-05 NOTE — Progress Notes (Signed)
CRITICAL VALUE ALERT  Critical value received:  Lactic Acid: 4.3  Date of notification:  05/05/2016  Time of notification:  0711  Critical value read back:Yes.    Nurse who received alert:  Eliane Decree, RN   MD notified (1st page):  Dr. Corrie Dandy  Time of first page:  0800  MD notified (2nd page):  Time of second page:  Responding MD:  Dr. Corrie Dandy  Time MD responded:  0800  MD on floor. Discussed course of care and progression of care with vasoactive meds with dayshift nurse in room.

## 2016-05-05 NOTE — ED Notes (Signed)
MD starting  Central line and will draw blood.

## 2016-05-05 NOTE — Progress Notes (Signed)
Pharmacy Antibiotic Note  ELLOUISE MANKOWSKI is a 55 y.o. female admitted on 05/04/2016 with sepsis.  Pharmacy has been consulted for Vancocin and Zosyn dosing.  Plan: Vancomycin 1000mg  in ED then 750mg  IV every 24 hours.  Goal trough 15-20 mcg/mL. Zosyn 3.375g IV q8h (4 hour infusion).  Height: 5\' 6"  (167.6 cm) Weight: 150 lb 12.7 oz (68.4 kg) IBW/kg (Calculated) : 59.3  Temp (24hrs), Avg:97.4 F (36.3 C), Min:96.6 F (35.9 C), Max:100 F (37.8 C)   Recent Labs Lab 05/04/16 2339 05/04/16 2344 05/05/16 0032 05/05/16 0226 05/05/16 0300  WBC 4.6  --  8.8  --  10.2  CREATININE  --   --  3.20*  --  2.78*  LATICACIDVEN  --  3.23*  --  4.4*  --     Estimated Creatinine Clearance: 20.9 mL/min (by C-G formula based on SCr of 2.78 mg/dL (H)).    No Known Allergies   Thank you for allowing pharmacy to be a part of this patient's care.  Wynona Neat, PharmD, BCPS  05/05/2016 5:40 AM

## 2016-05-05 NOTE — ED Notes (Signed)
No lab draw pt receiving blood at this time.

## 2016-05-05 NOTE — Progress Notes (Signed)
[facility] Progress Note Patient Name: [name] DOB: 1961-02-20 MRN: [id number]   Date of Service  05/05/2016  HPI/Events of Note  diarrhea  eICU Interventions  Check c diff     Intervention Category Intermediate Interventions: Other:  Joey Hudock 05/05/2016, 4:49 PM

## 2016-05-05 NOTE — Procedures (Signed)
Central Venous Catheter Insertion Procedure Note AMAHYA GEITZ [id number] 08-17-1960  Procedure: Insertion of Central Venous Catheter Indications: Assessment of intravascular volume, Drug and/or fluid administration and Frequent blood sampling  Procedure Details Consent: Unable to obtain consent because of emergent medical necessity. No family available and unable to contact Time Out: Verified patient identification, verified procedure, site/side was marked, verified correct patient position, special equipment/implants available, medications/allergies/relevent history reviewed, required imaging and test results available.  Performed  Maximum sterile technique was used including antiseptics, cap, gloves, gown, hand hygiene, mask and sheet. Skin prep: Chlorhexidine; local anesthetic administered A antimicrobial bonded/coated triple lumen catheter was placed in the left internal jugular vein using the Seldinger technique. Ultrasound guidance used.Yes.   Catheter placed to 20 cm. Blood aspirated via all 3 ports and then flushed x 3. Line sutured x 2 and dressing applied.  Evaluation Blood flow good Complications: No apparent complications Patient did tolerate procedure well. Chest X-ray ordered to verify placement.  CXR: pending.  Georgann Housekeeper, AGACNP-BC [facility] Pulmonology/Critical Care Pager [phone] or [phone]  05/05/2016 3:02 AM

## 2016-05-05 NOTE — ED Provider Notes (Signed)
Procedure Name: Intubation Date/Time: 05/05/2016 12:24 AM Performed by: Ron Parker, Jansel Vonstein Pre-anesthesia Checklist: Patient identified Oxygen Delivery Method: Non-rebreather mask Preoxygenation: Pre-oxygenation with 100% oxygen Intubation Type: IV induction and Rapid sequence Ventilation: Mask ventilation without difficulty Laryngoscope Size: Glidescope and 3 Grade View: Grade II Tube size: 7.5 mm Number of attempts: 1 Airway Equipment and Method: Stylet Placement Confirmation: ETT inserted through vocal cords under direct vision,  CO2 detector and Breath sounds checked- equal and bilateral Secured at: 24 cm Tube secured with: ETT holder Future Recommendations: Recommend- induction with short-acting agent, and alternative techniques readily available     Procedure note: Ultrasound Guided Peripheral IV Ultrasound guided peripheral 1.88 inch angiocath IV placement performed by me. Indications: Nursing unable to place IV. Details: The antecubital fossa and upper arm were evaluated with a multifrequency linear probe. Patent brachial veins were noted. 1 attempt was made to cannulate a vein under realtime US guidance with successful cannulation of the vein and catheter placement. There is return of non-pulsatile dark red blood. The patient tolerated the procedure well without complications. Images archived electronically.  CPT codes: [phone] and [phone]  Procedure note: Ultrasound Guided Peripheral IV Ultrasound guided peripheral 1.88 inch angiocath IV placement performed by me. Indications: Nursing unable to place IV. Details: The antecubital fossa and upper arm were evaluated with a multifrequency linear probe. Patent brachial veins were noted. 1 attempt was made to cannulate a vein under realtime US guidance with successful cannulation of the vein and catheter placement. There is return of non-pulsatile dark red blood. The patient tolerated the procedure well without complications. Images archived  electronically.  CPT codes: [phone] and [id number]  Procedure note: Ultrasound Guided Peripheral IV Ultrasound guided peripheral 1.88 inch angiocath IV placement performed by me. Indications: Nursing unable to place IV. Details: The antecubital fossa and upper arm were evaluated with a multifrequency linear probe. Patent brachial veins were noted. 1 attempt was made to cannulate a vein under realtime US guidance with successful cannulation of the vein and catheter placement. There is return of non-pulsatile dark red blood. The patient tolerated the procedure well without complications. Images archived electronically.  CPT codes: [id number] and [id number]     Dewaine Conger, MD 05/05/16 Dorthea Cove, MD 05/05/16 [phone]

## 2016-05-05 NOTE — Progress Notes (Signed)
eLink Physician-Brief Progress Note Patient Name: [name] DOB: [birth year] MRN: [id number]    Recent Labs Lab 05/05/16 0032 05/05/16 0300 05/05/16 1555 05/05/16 1730  CREATININE 3.20* 2.78* 2.17* 1.89*     Recent Labs Lab 05/05/16 0226 05/05/16 0555 05/05/16 1730  LATICACIDVEN 4.4* 4.3* 3.7*    C Diff negative  A parametes improving  P Repeat fluid bolus 1L    Intervention Category Major Interventions: Acid-Base disturbance - evaluation and management;Acute renal failure - evaluation and management  Derian Dimalanta 05/05/2016, 10:51 PM

## 2016-05-05 NOTE — ED Notes (Signed)
CRITICAL VALUE ALERT  Critical value received:  Hg 4.7, INR 5.20

## 2016-05-06 LAB — BASIC METABOLIC PANEL
ANION GAP: 11 (ref 5–15)
BUN: 36 mg/dL — AB (ref 6–20)
CALCIUM: 8.5 mg/dL — AB (ref 8.9–10.3)
CO2: 15 mmol/L — AB (ref 22–32)
Chloride: 117 mmol/L — ABNORMAL HIGH (ref 101–111)
Creatinine, Ser: 1.15 mg/dL — ABNORMAL HIGH (ref 0.44–1.00)
GFR calc Af Amer: >60 mL/min (ref >60)
GFR calc non Af Amer: 52 mL/min — ABNORMAL LOW (ref >60)
GLUCOSE: 154 mg/dL — AB (ref 65–99)
Potassium: 2.6 mmol/L — CL (ref 3.5–5.1)
Sodium: 143 mmol/L (ref 135–145)

## 2016-05-06 LAB — CBC
HEMATOCRIT: 26.6 % — AB (ref 36.0–46.0)
Hemoglobin: 9.3 g/dL — ABNORMAL LOW (ref 12.0–15.0)
MCH: 29.2 pg (ref 26.0–34.0)
MCHC: 35 g/dL (ref 30.0–36.0)
MCV: 83.6 fL (ref 78.0–100.0)
Platelets: 271 10*3/uL (ref 150–400)
RBC: 3.18 MIL/uL — ABNORMAL LOW (ref 3.87–5.11)
RDW: 16.3 % — AB (ref 11.5–15.5)
WBC: 19.1 10*3/uL — AB (ref 4.0–10.5)

## 2016-05-06 LAB — LACTIC ACID, PLASMA: Lactic Acid, Venous: 3.9 mmol/L (ref 0.5–1.9)

## 2016-05-06 LAB — MAGNESIUM: Magnesium: 1.4 mg/dL — ABNORMAL LOW (ref 1.7–2.4)

## 2016-05-06 LAB — PHOSPHORUS: Phosphorus: 1.9 mg/dL — ABNORMAL LOW (ref 2.5–4.6)

## 2016-05-06 MED ORDER — DEXTROSE-NACL 5-0.45 % IV SOLN: INTRAVENOUS | Status: DC

## 2016-05-06 MED ORDER — INSULIN REGULAR HUMAN 100 UNIT/ML IJ SOLN
INTRAMUSCULAR | Status: DC
  Filled 2016-05-06: qty 2.5

## 2016-05-06 MED ORDER — SODIUM CHLORIDE 0.9 % IV SOLN: INTRAVENOUS | Status: DC

## 2016-05-06 MED ORDER — POTASSIUM PHOSPHATES 15 MMOLE/5ML IV SOLN
20.0000 mmol | Freq: Once | INTRAVENOUS | Status: AC
  Administered 2016-05-06: 20 mmol via INTRAVENOUS
  Filled 2016-05-06: qty 6.67

## 2016-05-06 MED ORDER — INSULIN REGULAR BOLUS VIA INFUSION
0.0000 [IU] | Freq: Three times a day (TID) | INTRAVENOUS | Status: DC
  Filled 2016-05-06: qty 10

## 2016-05-06 MED ORDER — FAMOTIDINE IN NACL 20-0.9 MG/50ML-% IV SOLN
20.0000 mg | Freq: Two times a day (BID) | INTRAVENOUS | Status: DC
  Administered 2016-05-06 – 2016-05-09 (×7): 20 mg via INTRAVENOUS
  Filled 2016-05-06 (×9): qty 50

## 2016-05-06 MED ORDER — VANCOMYCIN HCL IN DEXTROSE 750-5 MG/150ML-% IV SOLN
750.0000 mg | Freq: Two times a day (BID) | INTRAVENOUS | Status: DC
  Administered 2016-05-06 – 2016-05-07 (×3): 750 mg via INTRAVENOUS
  Filled 2016-05-06 (×4): qty 150

## 2016-05-06 MED ORDER — DEXTROSE 50 % IV SOLN: 25.0000 mL | INTRAVENOUS | Status: DC | PRN

## 2016-05-06 NOTE — Progress Notes (Signed)
Pt's cousin, [name], updated on pt's condition and current plan of care. [name] to bring in her medical POA paperwork.

## 2016-05-06 NOTE — Progress Notes (Signed)
PULMONARY / CRITICAL CARE MEDICINE   Name: [name] MRN: [id number] DOB: 1960/11/01    ADMISSION DATE:  05/04/2016 CONSULTATION DATE:  05/05/2016  REFERRING MD:  Dr. Wyvonnia Dusky EDP  CHIEF COMPLAINT:  AMS  HISTORY OF PRESENT ILLNESS:   55 year old female with PMH as below, which is significant for MS, CKD III, DM, GERD, HTN, nephrolithiasis, sacral decub, and neuromuscular disorder of bladder. She was recently admitted to [facility] when she presented with septic shock with concern for urinary source vs pelvic abscess. She has a chronic indwelling nephrostomy tube and bilateral hydronephrosis due to obstructing urinary calculi. She was evaluated by urology, cystoscopy did not reveal any evidence of fistula. Plan was for her to undergo nephrolithotomy on 11/30 ( dahlstedt). She was discharged to [facility] with 2 weeks of antibiotics while PICC line, which was discontinued on 11/21.  She was brought in by EMS on 11/22 with altered mental status in the form of lethargy for one day and no urine output. Frank pus was noted both in the Foley and nephrostomy tube, her Foley was coated and feces, she was hypotensive and sepsis protocol was initiated. She was intubated for airway protection, given Zosyn and vancomycin. Initial hemoglobin was 4 but on repeat this was noted to be 11   SUBJECTIVE:  Awake has old stroke  VITAL SIGNS: BP 94/61   Pulse 93   Temp 99.1 F (37.3 C)   Resp (!) 24   Ht 5\' 6"  (1.676 m)   Wt 156 lb 12 oz (71.1 kg)   SpO2 100%   BMI 25.30 kg/m   HEMODYNAMICS: CVP:  [9 mmHg-10 mmHg] 9 mmHg  VENTILATOR SETTINGS: Vent Mode: PRVC FiO2 (%):  [50 %] 50 % Set Rate:  [24 bmp] 24 bmp Vt Set:  [500 mL] 500 mL PEEP:  [5 cmH20] 5 cmH20 Plateau Pressure:  [19 cmH20-20 cmH20] 19 cmH20  INTAKE / OUTPUT: I/O last 3 completed shifts: In: 10509.2 [I.V.:8011.7; Blood:335; IV Piggyback:2162.5] Out: [id number] [Urine:3105; Other:250]  PHYSICAL EXAMINATION: General:  Chronically  ill-appearing, well-built, poorly nourished Neuro:  Unresponsive, pupils 2 mm dark reactive to light, opens eyes, rt side weakness and neglect HEENT:  No JVD, dry mucosa Cardiovascular:  S1-S2 tachycardia Lungs:  Decreased breath sounds bilateral, no rhonchi 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  LABS:  BMET  Recent Labs Lab 05/05/16 1555 05/05/16 1730 05/06/16 0500  NA 137 140 143  K 3.0* 3.1* 2.6*  CL 110 112* 117*  CO2 15* 17* 15*  BUN 55* 52* 36*  CREATININE 2.17* 1.89* 1.15*  GLUCOSE 190* 200* 154*    Electrolytes  Recent Labs Lab 05/05/16 0300 05/05/16 1555 05/05/16 1730 05/06/16 0500  CALCIUM 8.2* 8.4* 8.4* 8.5*  MG 1.3*  --   --  1.4*  PHOS 3.4  --   --  1.9*    CBC  Recent Labs Lab 05/05/16 0032 05/05/16 0300 05/06/16 0500  WBC 8.8 10.2 19.1*  HGB 11.4* 11.2* 9.3*  HCT 34.8* 33.3* 26.6*  PLT 258 310 271    Coag's  Recent Labs Lab 05/04/16 2339 05/05/16 0300  INR 5.20* 2.37    Sepsis Markers  Recent Labs Lab 05/05/16 0555 05/05/16 1730 05/06/16 0130  LATICACIDVEN 4.3* 3.7* 3.9*    ABG  Recent Labs Lab 05/05/16 0020 05/05/16 0330  PHART 7.251* 7.395  PCO2ART 34.3 20.3*  PO2ART 447.0* 209*  Liver Enzymes  Recent Labs Lab 05/05/16 0032  AST 37  ALT 17  ALKPHOS 37*  BILITOT 1.3*  ALBUMIN 2.2*    Cardiac Enzymes  Recent Labs Lab 05/05/16 0830 05/05/16 1450  TROPONINI 0.17* 0.18*    Glucose No results for input(s): GLUCAP in the last 168 hours.  Imaging No results found.   STUDIES:  CT renal stone study 11/22 >> Diffuse bladder wall thickening, with complex fluid tracking about the bladder and uterus, but no defined abscess at this time. Diffuse soft tissue inflammation extends about the lower abdomen and pelvis. This is concerning for diffuse pelvic infection and cystitis. 2. Mild to moderate  bilateral hydronephrosis, with a left-sided nephrostomy tube. Large obstructing left ureteral stone measures 1.9 x 1.0 cm, 2 cm above the left renal pelvis. Right-sided hydronephrosis appears to reflect the underlying bladder process. 3. Nonobstructing left renal stones measure up to 1.9 cm in size.  CULTURES: 11/22 urine >>gnr 11/22 blood >>  ANTIBIOTICS: 11/22 zosyn 11/22 vanc >>  SIGNIFICANT EVENTS: 11/22 admit   LINES/TUBES: 11/22 ETT >> 11/23 l I j>>   DISCUSSION: Appears to be yet another episode of septic shock, frank pus from nephrostomy indicates that as most likely source She is intubated for airway protection, initial hemoglobin appears to be false-no evidence of upper GI bleed on NG aspirate but note stool positive for occult blood  ASSESSMENT / PLAN:  PULMONARY A: Acute respiratory failure Intubated for airway protection P:   Vent settings reviewed and adjusted Not extubateable today   CARDIOVASCULAR A:  Septic shock P:  Levophed Add vasopressin Central line with cvp 10 Cycle troponins and lactate  RENAL Lab Results  Component Value Date   CREATININE 1.15 (H) 05/06/2016   CREATININE 1.89 (H) 05/05/2016   CREATININE 2.17 (H) 05/05/2016     Recent Labs Lab 05/05/16 1555 05/05/16 1730 05/06/16 0500  K 3.0* 3.1* 2.6*     A:   AKI Obstructive hydronephrosis/ pyonephrosis s/p left nephrostomy tube P:   Will need urology consult again once stabilized Watch electrolytes, no acute indication for dialysis at this time-will follow renal function Replete lytes as needed   GASTROINTESTINAL A:   UGIB- Stool occult blood positive P:   Serial hemoglobin   HEMATOLOGIC  Recent Labs  05/05/16 0300 05/06/16 0500  HGB 11.2* 9.3*    A:   Chronic DVT, last study 02/2015 P:  Hold Xarelto for now-given concern for upper GI bleed  INFECTIOUS A:   Pyonephrosis -  sensitive Pseudomonas isolated in 01/2016  P:   Zosyn/vancomycin Follow  cultures and simplify as needed  ENDOCRINE A:   At risk for relative adrenal insufficiency   P:   Stress dose steroids   NEUROLOGIC A:   Multiple sclerosis  P:   RASS goal: 0 fentanyl when necessary  FAMILY  - Updates: No family at bedside  - Inter-disciplinary family meet or Palliative Care meeting due by:  day 7  CCT=35 min   Richardson Landry Minor ACNP Maryanna Shape PCCM Pager [phone] till 3 pm If no answer page [phone] 05/06/2016, 9:47 AM   Attending note: I have seen and examined the patient with nurse practitioner/resident and agree with the note. History, labs and imaging reviewed.  55 Y/O with septic shock, urosepsis, VDRF Staph bacteremia  Blood pressure 111/65, pulse (!) 55, temperature 99.1 F (37.3 C), resp. rate (!) 24, height 5\' 6"  (1.676 m), weight 156 lb 12 oz (71.1 kg), SpO2 100 %. CVS-RRR RS-Clear Abd-  Soft, + BS Ext- No edema  Labs and imaging reviewed.  - Weaning levaphed off - Continue broad abx with vanco, zosyn. Follow cultures - Stress dose steroids - Will need urology to follow when more stable. - Restart xarelto as there is no clear evidence of GIB. Hb is stable  The patient is critically ill with multiple organ systems failure and requires high complexity decision making for assessment and support, frequent evaluation and titration of therapies, application of advanced monitoring technologies and extensive interpretation of multiple databases. Critical care time - 35 mins. This represents my time independent of the NPs time taking care of the pt.  Marshell Garfinkel MD [facility] Pager [phone] If no answer or after 3pm call: [phone] 05/06/2016, 11:27 AM

## 2016-05-06 NOTE — Progress Notes (Addendum)
Pharmacy Antibiotic Note  [name] is a 55 y.o. female admitted on 05/04/2016 with sepsis.  Pharmacy has been consulted for vancomycin and Zosyn dosing.  Patient's renal function is improving.  She is now afebrile and her WBC trended up to 19.1 (on steroid).  Her blood culture is positive for MRSE 1 of 2.   Plan: - Increase vanc to 750mg  IV Q12H - Continue Zosyn 3.375gm IV Q8H, 4 hr infusion - Monitor renal fxn, micro data to de-escalate, vanc trough as indicated - F/U lytes supplementation - Increase Pepcid to BID given improved renal function   Height: 5\' 6"  (167.6 cm) Weight: 156 lb 12 oz (71.1 kg) IBW/kg (Calculated) : 59.3  Temp (24hrs), Avg:99.5 F (37.5 C), Min:95.9 F (35.5 C), Max:101.3 F (38.5 C)   Recent Labs Lab 05/04/16 2339 05/04/16 2344 05/05/16 0032 05/05/16 0226 05/05/16 0300 05/05/16 0555 05/05/16 1555 05/05/16 1730 05/06/16 0130 05/06/16 0500  WBC 4.6  --  8.8  --  10.2  --   --   --   --  19.1*  CREATININE  --   --  3.20*  --  2.78*  --  2.17* 1.89*  --  1.15*  LATICACIDVEN  --  3.23*  --  4.4*  --  4.3*  --  3.7* 3.9*  --     Estimated Creatinine Clearance: 50.5 mL/min (by C-G formula based on SCr of 1.15 mg/dL (H)).    No Known Allergies   Antimicrobials this admission:  11/22 Vanc >>  11/22 Zosyn >>   Dose adjustments this admission:  N/A  Microbiology results:  11/22 UCx -  11/22 BCx x2 - GPC 1 of 2 (BCID MRSE) 11/23 C.diff - negative 11/23 TA -   Shawntina Diffee D. Mina Marble, PharmD, BCPS Pager:  [phone] 05/06/2016, 8:16 AM    =============================   Addendum: - restart Xarelto for hx DVTs since no clear evidence of GIB per MD - renal fxn improving - Xarelto 20mg  PO daily with supper.  Will sign off and follow peripherally.  Thank you for the consult!   [name] D. Mina Marble, PharmD, BCPS Pager:  [phone] 05/06/2016, 12:56 PM

## 2016-05-06 NOTE — Progress Notes (Signed)
Initial Nutrition Assessment  DOCUMENTATION CODES:  Not applicable  INTERVENTION:  If unable to extubate in next 24 hours, recommend Tube feeding via OGT with Vital AF 1.2 at goal rate of 65 ml/h (1560 ml per day) to provide 1872 kcals, 117 gm protein, 1265 ml free water daily.  NUTRITION DIAGNOSIS:  Inadequate oral intake related to inability to eat as evidenced by NPO status.  GOAL:  Patient will meet greater than or equal to 90% of their needs  MONITOR:  Diet advancement, Vent status, Labs, Weight trends, I & O's  REASON FOR ASSESSMENT:  Ventilator    ASSESSMENT:  55 y/o female PMHx MS, CKD3, DM, GERD,  CVA, HTN, nephrolithiasis, sacral decub. Presented w/ AMS, lethargy, and 1 day with no urine output. Worked up for Septic Shock. Intubated for airway protection.   Family member at bedside. She reports that pt had a good appetite up until this event. Pt did not take any vitamins. Family member does not know what patients UBW was.  NFPE: WDL. Abdomen: soft  Patient is currently intubated on ventilator support MV: 12.3 L/min Temp (24hrs), Avg:99.4 F (37.4 C), Min:95.9 F (35.5 C), Max:101.3 F (38.5 C)  Propofol:None  Labs: CBGs: 150-200, K/mag/phos low, Albumin: 2.2,  Medications: Fentanyl, Pepcid, IV abx, NS, Levo, Kphos, insulin   Recent Labs Lab 05/05/16 0300 05/05/16 1555 05/05/16 1730 05/06/16 0500  NA 139 137 140 143  K 3.7 3.0* 3.1* 2.6*  CL 112* 110 112* 117*  CO2 13* 15* 17* 15*  BUN 57* 55* 52* 36*  CREATININE 2.78* 2.17* 1.89* 1.15*  CALCIUM 8.2* 8.4* 8.4* 8.5*  MG 1.3*  --   --  1.4*  PHOS 3.4  --   --  1.9*  GLUCOSE 176* 190* 200* 154*   Diet Order:  Diet NPO time specified  Skin: PU stage 2 to sacrum, PU stage 1 to sacrum, skin cracking to feet and legs.   Last BM:  11/23- diarrhea - incontinent  Height:  Ht Readings from Last 1 Encounters:  05/05/16 5\' 6"  (1.676 m)   Weight:  Wt Readings from Last 1 Encounters:  05/06/16 156 lb 12  oz (71.1 kg)   Wt Readings from Last 10 Encounters:  05/06/16 156 lb 12 oz (71.1 kg)  05/04/16 96 lb 1.6 oz (43.6 kg)  05/03/16 96 lb 1.6 oz (43.6 kg)  04/22/16 96 lb 1.6 oz (43.6 kg)  03/21/16 163 lb (73.9 kg)  03/20/16 163 lb (73.9 kg)  03/08/16 94 lb 14.4 oz (43 kg)  02/16/16 140 lb (63.5 kg)  01/11/16 140 lb (63.5 kg)  01/04/16 140 lb 3.2 oz (63.6 kg)  Dosing: 150 lbs 13 oz (68.54 kg)  Ideal Body Weight:  59.1 kg  BMI:  Body mass index is 25.3 kg/m.  Estimated Nutritional Needs:  Kcal:  1840 kcals Protein:  110-125 g (1.6-.18 g/kg bw) Fluid:  > 2 Liters  EDUCATION NEEDS:  No education needs identified at this time  Burtis Junes RD, LDN, [facility] Pager: [phone] 05/06/2016 3:58 PM

## 2016-05-07 ENCOUNTER — Inpatient Hospital Stay (HOSPITAL_COMMUNITY): Payer: Medicare Other

## 2016-05-07 DIAGNOSIS — J9601 Acute respiratory failure with hypoxia: Secondary | ICD-10-CM

## 2016-05-07 DIAGNOSIS — N10 Acute tubulo-interstitial nephritis: Secondary | ICD-10-CM

## 2016-05-07 LAB — MAGNESIUM
MAGNESIUM: 1.5 mg/dL — AB (ref 1.7–2.4)
MAGNESIUM: 2.2 mg/dL (ref 1.7–2.4)

## 2016-05-07 LAB — BLOOD GAS, ARTERIAL
Acid-base deficit: 9.3 mmol/L — ABNORMAL HIGH (ref 0.0–2.0)
Bicarbonate: 14.1 mmol/L — ABNORMAL LOW (ref 20.0–28.0)
Drawn by: 244851
FIO2: 40
O2 SAT: 99 %
PEEP: 5 cmH2O
PH ART: 7.441 (ref 7.350–7.450)
Patient temperature: 98.6
RATE: 20 resp/min
VT: 500 mL
pCO2 arterial: 21 mmHg — ABNORMAL LOW (ref 32.0–48.0)
pO2, Arterial: 163 mmHg — ABNORMAL HIGH (ref 83.0–108.0)

## 2016-05-07 LAB — CBC
HEMATOCRIT: 25.8 % — AB (ref 36.0–46.0)
HEMOGLOBIN: 9 g/dL — AB (ref 12.0–15.0)
MCH: 28.9 pg (ref 26.0–34.0)
MCHC: 34.9 g/dL (ref 30.0–36.0)
MCV: 83 fL (ref 78.0–100.0)
Platelets: 205 10*3/uL (ref 150–400)
RBC: 3.11 MIL/uL — ABNORMAL LOW (ref 3.87–5.11)
RDW: 15.6 % — AB (ref 11.5–15.5)
WBC: 22.4 10*3/uL — AB (ref 4.0–10.5)

## 2016-05-07 LAB — BASIC METABOLIC PANEL
ANION GAP: 8 (ref 5–15)
ANION GAP: 8 (ref 5–15)
BUN: 25 mg/dL — ABNORMAL HIGH (ref 6–20)
BUN: 25 mg/dL — ABNORMAL HIGH (ref 6–20)
CALCIUM: 8.3 mg/dL — AB (ref 8.9–10.3)
CHLORIDE: 117 mmol/L — AB (ref 101–111)
CHLORIDE: 119 mmol/L — AB (ref 101–111)
CO2: 17 mmol/L — AB (ref 22–32)
CO2: 14 mmol/L — AB (ref 22–32)
Calcium: 8.1 mg/dL — ABNORMAL LOW (ref 8.9–10.3)
Creatinine, Ser: 0.83 mg/dL (ref 0.44–1.00)
Creatinine, Ser: 0.86 mg/dL (ref 0.44–1.00)
GFR calc non Af Amer: >60 mL/min (ref >60)
GFR calc non Af Amer: >60 mL/min (ref >60)
Glucose, Bld: 141 mg/dL — ABNORMAL HIGH (ref 65–99)
Glucose, Bld: 180 mg/dL — ABNORMAL HIGH (ref 65–99)
POTASSIUM: 3.3 mmol/L — AB (ref 3.5–5.1)
Potassium: 2.3 mmol/L — CL (ref 3.5–5.1)
SODIUM: 142 mmol/L (ref 135–145)
SODIUM: 141 mmol/L (ref 135–145)

## 2016-05-07 LAB — URINE CULTURE

## 2016-05-07 LAB — GLUCOSE, CAPILLARY
Glucose-Capillary: 151 mg/dL — ABNORMAL HIGH (ref 65–99)
Glucose-Capillary: 203 mg/dL — ABNORMAL HIGH (ref 65–99)
Glucose-Capillary: 228 mg/dL — ABNORMAL HIGH (ref 65–99)

## 2016-05-07 LAB — CULTURE, BLOOD (ROUTINE X 2)

## 2016-05-07 LAB — MRSA CULTURE: Culture: NOT DETECTED

## 2016-05-07 LAB — PHOSPHORUS: PHOSPHORUS: 2.2 mg/dL — AB (ref 2.5–4.6)

## 2016-05-07 MED ORDER — POTASSIUM PHOSPHATES 15 MMOLE/5ML IV SOLN
20.0000 mmol | Freq: Once | INTRAVENOUS | Status: AC
  Administered 2016-05-07: 20 mmol via INTRAVENOUS
  Filled 2016-05-07: qty 6.67

## 2016-05-07 MED ORDER — FENTANYL CITRATE (PF) 100 MCG/2ML IJ SOLN: 12.5000 ug | INTRAMUSCULAR | Status: DC | PRN

## 2016-05-07 MED ORDER — CHLORHEXIDINE GLUCONATE 0.12 % MT SOLN
OROMUCOSAL | Status: AC
  Filled 2016-05-07: qty 15

## 2016-05-07 MED ORDER — DEXTROSE 5 % IV SOLN
1.0000 g | INTRAVENOUS | Status: DC
  Administered 2016-05-07 – 2016-05-15 (×9): 1 g via INTRAVENOUS
  Filled 2016-05-07 (×15): qty 10

## 2016-05-07 MED ORDER — PRO-STAT SUGAR FREE PO LIQD
30.0000 mL | Freq: Two times a day (BID) | ORAL | Status: DC
  Administered 2016-05-07 – 2016-05-09 (×4): 30 mL
  Filled 2016-05-07 (×4): qty 30

## 2016-05-07 MED ORDER — VITAL HIGH PROTEIN PO LIQD: 1000.0000 mL | ORAL | Status: DC

## 2016-05-07 MED ORDER — HYDROCORTISONE NA SUCCINATE PF 100 MG IJ SOLR
50.0000 mg | Freq: Two times a day (BID) | INTRAMUSCULAR | Status: DC
  Administered 2016-05-07 – 2016-05-08 (×2): 50 mg via INTRAVENOUS
  Filled 2016-05-07 (×2): qty 2

## 2016-05-07 MED ORDER — POTASSIUM CHLORIDE 20 MEQ/15ML (10%) PO SOLN
40.0000 meq | ORAL | Status: DC
  Administered 2016-05-07 (×2): 40 meq
  Filled 2016-05-07 (×2): qty 30

## 2016-05-07 MED ORDER — MAGNESIUM SULFATE 2 GM/50ML IV SOLN
2.0000 g | Freq: Once | INTRAVENOUS | Status: AC
  Administered 2016-05-07: 2 g via INTRAVENOUS
  Filled 2016-05-07: qty 50

## 2016-05-07 MED ORDER — VITAL AF 1.2 CAL PO LIQD
1000.0000 mL | ORAL | Status: DC
  Administered 2016-05-07 – 2016-05-08 (×2): 1000 mL
  Filled 2016-05-07 (×2): qty 1000

## 2016-05-07 MED ORDER — INSULIN ASPART 100 UNIT/ML ~~LOC~~ SOLN
0.0000 [IU] | SUBCUTANEOUS | Status: DC
  Administered 2016-05-07: 7 [IU] via SUBCUTANEOUS
  Administered 2016-05-07: 4 [IU] via SUBCUTANEOUS
  Administered 2016-05-08: 7 [IU] via SUBCUTANEOUS
  Administered 2016-05-08: 4 [IU] via SUBCUTANEOUS
  Administered 2016-05-08: 7 [IU] via SUBCUTANEOUS
  Administered 2016-05-08: 3 [IU] via SUBCUTANEOUS
  Administered 2016-05-08: 4 [IU] via SUBCUTANEOUS
  Administered 2016-05-09: 3 [IU] via SUBCUTANEOUS

## 2016-05-07 NOTE — Progress Notes (Signed)
Per X-ray this morning, OG tube was advanced approximately 10 cm. Tube placement was audible in the stomach prior to intervention, but imaging revealed the tube needed to be advanced. Placement was verified via auscultation and gastric secretions were able to be aspirated.

## 2016-05-07 NOTE — Progress Notes (Signed)
PULMONARY / CRITICAL CARE MEDICINE   Name: [name] MRN: [id number] DOB: 1961-01-11    ADMISSION DATE:  05/04/2016 CONSULTATION DATE:  05/05/2016  REFERRING MD:  Dr. Wyvonnia Dusky EDP  CHIEF COMPLAINT:  AMS  HISTORY OF PRESENT ILLNESS:   55 year old female with hx MS, CKD III, DM, GERD, HTN, nephrolithiasis, sacral decub, and neuromuscular disorder of bladder. She has a chronic indwelling nephrostomy tube and bilateral hydronephrosis due to obstructing urinary calculi. Recent admission for septic shock from urinary source at which time she was evaluated by urology, cystoscopy did not reveal any evidence of fistula. Plan was for her to undergo nephrolithotomy on 11/30 ( dahlstedt). She was discharged to [facility] with 2 weeks of antibiotics via PICC line, which was discontinued on 11/21.  She was brought in by EMS on 11/22 with altered mental status in the form of lethargy for one day and no urine output. Frank pus was noted both in the Foley and nephrostomy tube, her Foley was coated and feces, she was hypotensive and sepsis protocol was initiated. She was intubated for airway protection, given Zosyn and vancomycin. Initial hemoglobin was 4 but on repeat this was noted to be 11   SUBJECTIVE:  Wants ETT out.  Apneic on SBT despite being wide awake, communicates appropriately.   VITAL SIGNS: BP 137/76   Pulse (!) 53   Temp 98.8 F (37.1 C)   Resp (!) 24   Ht 5\' 6"  (1.676 m)   Wt 72 kg (158 lb 11.7 oz)   SpO2 100%   BMI 25.62 kg/m   HEMODYNAMICS: CVP:  [6 mmHg-17 mmHg] 11 mmHg  VENTILATOR SETTINGS: Vent Mode: PRVC FiO2 (%):  [40 %] 40 % Set Rate:  [24 bmp] 24 bmp Vt Set:  [500 mL] 500 mL PEEP:  [5 cmH20] 5 cmH20 Plateau Pressure:  [20 cmH20-23 cmH20] 20 cmH20  INTAKE / OUTPUT: I/O last 3 completed shifts: In: 5920.6 [I.V.:4273.9; Other:350; NG/GT:90; IV Piggyback:1206.7] Out: 1865 [Urine:1765; Emesis/NG output:100]  PHYSICAL EXAMINATION: General:  Chronically  ill-appearing, well-built, poorly nourished Neuro:  Awake, alert, follows commands, nods appropriately, MAE, gen weakness  HEENT:  No JVD, dry mucosa Cardiovascular:  S1-S2 tachycardia Lungs:  resps even non labored on vent, apneic on PS 10/5, Decreased breath sounds bilateral, no rhonchi 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  CBC  Recent Labs Lab 05/05/16 0300 05/06/16 0500 05/07/16 0455  WBC 10.2 19.1* 22.4*  HGB 11.2* 9.3* 9.0*  HCT 33.3* 26.6* 25.8*  PLT 310 271 205    Coag's  Recent Labs Lab 05/04/16 2339 05/05/16 0300  INR 5.20* 2.37    Sepsis Markers  Recent Labs Lab 05/05/16 0555 05/05/16 1730 05/06/16 0130  LATICACIDVEN 4.3* 3.7* 3.9*    ABG  Recent Labs Lab 05/05/16 0020  05/05/16 0330  PHART 7.251* 7.395  PCO2ART 34.3 20.3*  PO2ART 447.0* 209*    Liver Enzymes  Recent Labs Lab 05/05/16 0032  AST 37  ALT 17  ALKPHOS 37*  BILITOT 1.3*  ALBUMIN 2.2*    Cardiac Enzymes  Recent Labs Lab 05/05/16 0830 05/05/16 1450  TROPONINI 0.17* 0.18*    Glucose No results for input(s): GLUCAP in the last 168 hours.  Imaging Dg Chest Port 1 View  Result Date: 05/07/2016 CLINICAL DATA:  55 year old female with respiratory failure EXAM: PORTABLE CHEST 1 VIEW COMPARISON:  Prior chest x-ray 05/05/2016 FINDINGS: The patient remains intubated. The tip of the endotracheal tube is 6.3 cm above  the carina. A gastric tube is present. The tip of the gastric tube projects over the distal esophagus. This has significantly pulled back compared to prior. Left IJ approach central venous catheter with the catheter tip overlying the cavoatrial junction. Stable cardiac and mediastinal contours. Persistent low inspiratory volumes with nonspecific bibasilar opacities. Probable small left layering pleural effusion. Patient is rotated toward the left on today's exam. IMPRESSION: 1. The nasogastric tube has significantly pulled back. The tip now overlies the distal esophagus. If the an tennis for gastric decompression, recommend advancing this tube approximately 10 cm. 2. Other support apparatus in stable and satisfactory position. 3. Persistent low inspiratory volumes with nonspecific bibasilar opacities favored to reflect atelectasis. 4. Suspect small left pleural effusion. Electronically Signed   By: Jacqulynn Cadet M.D.   On: 05/07/2016 08:18     STUDIES:  CT renal stone study 11/22 >> Diffuse bladder wall thickening, with complex fluid tracking about the bladder and uterus, but no defined abscess at this time. Diffuse soft tissue inflammation extends about the lower abdomen and pelvis. This is concerning for diffuse pelvic infection and cystitis. 2. Mild to moderate bilateral hydronephrosis, with a left-sided nephrostomy tube. Large obstructing left ureteral stone measures 1.9 x 1.0 cm, 2 cm above the left renal pelvis. Right-sided hydronephrosis appears to reflect the underlying bladder process. 3. Nonobstructing left renal stones measure up to 1.9 cm in size.  CULTURES: 11/22 urine >>gnr>> [facility]  11/22 blood >>  ANTIBIOTICS: 11/22 zosyn>>11/24 11/22 vanc >>11/24 cefriazone 11/24>>>  SIGNIFICANT EVENTS: 11/22 admit   LINES/TUBES: 11/22 ETT >> 11/23 l I j>>   DISCUSSION: Appears to be yet another episode of septic shock, frank pus from nephrostomy indicates that as most likely  source She is intubated for airway protection, initial hemoglobin appears to be false-no evidence of upper GI bleed on NG aspirate but note stool positive for occult blood  ASSESSMENT / PLAN:  PULMONARY A: Acute respiratory failure Intubated for airway protection P:   Vent support - 8cc/kg  F/u CXR  F/u ABG Decrease RR 20  F/u abg now ensure not over-ventilated  Daily SBT.  Fentanyl off   CARDIOVASCULAR A:  Septic shock - improved.  P:  Levophed off  Trend CVP    RENAL   AKI Obstructive hydronephrosis/ pyonephrosis s/p left nephrostomy tube HypoMg  hypoPhos  Hypokalemia  P:   Will need urology consult again once stabilized Watch electrolytes, no acute indication for dialysis at this time-will follow renal function Replete lytes as needed    GASTROINTESTINAL A:   UGIB- Stool occult blood positive - no further evidence of bleeding  P:   Trend CBC  Hold xarelto - consider resume in am   Add TF    HEMATOLOGIC A:   Chronic DVT, last study 02/2015 Anemia  P:  Hold Xarelto for now-given concern for upper GI bleed F/u CBC   INFECTIOUS A:   Pyelonephrosis -  sensitive Pseudomonas isolated in 01/2016 EColi UTI P:   Ceftriaxone as above  Follow cultures and simplify as needed  ENDOCRINE A:   At risk for relative adrenal insufficiency   P:   Stress dose steroids   NEUROLOGIC A:   Multiple sclerosis  P:   RASS goal: 0 PRN fentanyl - d/c gtt   FAMILY  - Updates: No family at bedside 11/25  - Inter-disciplinary family meet or Palliative Care meeting due by:  day [place], NP 05/07/2016  2:48 PM Pager: [phone] or [phone]  ATTENDING NOTE / ATTESTATION NOTE :   I have discussed the case with the resident/APP  Nickolas Madrid NP.   I agree with the resident/APP's  history, physical examination, assessment, and plans.    I have edited the above note and modified it according to our agreed history, physical examination,  assessment and plan. Pt admitted with pelvic abscess and related septic shock.  Pt examined. Comfortable. VSS. Crackles at bases. Gr 1 edema.  rest of exam as above. Still on the vent for airway protection. Weaning pressors. Failed SBT today. Switch abx to rocephin.   I have spent 30  minutes of critical care time with this patient today.  Family :  No family at bedside.    Monica Becton, MD 05/07/2016, 5:19 PM [facility] Pager [phone] After 3 pm or if no answer, call [phone]

## 2016-05-07 NOTE — Progress Notes (Signed)
[facility] ADULT ICU REPLACEMENT PROTOCOL FOR AM LAB REPLACEMENT ONLY  The patient does apply for the [facility] Adult ICU Electrolyte Replacment Protocol based on the criteria listed below:   1. Is GFR >/= 40 ml/min? Yes.    Patient's GFR today is >60 2. Is urine output >/= 0.5 ml/kg/hr for the last 6 hours? Yes.   Patient's UOP is 0.9 ml/kg/hr 3. Is BUN < 60 mg/dL? Yes.    Patient's BUN today is 25 4. Abnormal electrolyte(s):K2.3,Mg1.5 5. Ordered repletion with: per protocol 6. If a panic level lab has been reported, has the CCM MD in charge been notified? Yes.  .   Physician:  Patricia Nettle, MD  [place] Clock 05/07/2016 6:06 AM

## 2016-05-07 NOTE — Progress Notes (Signed)
CRITICAL VALUE ALERT  Critical value received:  2.3  Date of notification:  05/07/2016  Time of notification:  0555  Critical value read back: yes  Nurse who received alert:  Beacher May  MD notified (1st page):  CCM via elink  Time of first page:  0556  MD notified (2nd page):  Time of second page:  Responding MD:  CCM MD  Time MD responded:  [phone]  Spoke with elink RN/MD, was told they are looking at the values for replacement and orders will be placed. Replacing K w/ oral potassium chloride via OGT, unless contraindicated.  Guadalupe Maple, RN

## 2016-05-08 DIAGNOSIS — N390 Urinary tract infection, site not specified: Secondary | ICD-10-CM

## 2016-05-08 LAB — GLUCOSE, CAPILLARY
GLUCOSE-CAPILLARY: 238 mg/dL — AB (ref 65–99)
GLUCOSE-CAPILLARY: 228 mg/dL — AB (ref 65–99)
GLUCOSE-CAPILLARY: 189 mg/dL — AB (ref 65–99)
Glucose-Capillary: 112 mg/dL — ABNORMAL HIGH (ref 65–99)
Glucose-Capillary: 145 mg/dL — ABNORMAL HIGH (ref 65–99)
Glucose-Capillary: 169 mg/dL — ABNORMAL HIGH (ref 65–99)
Glucose-Capillary: 204 mg/dL — ABNORMAL HIGH (ref 65–99)
Glucose-Capillary: 209 mg/dL — ABNORMAL HIGH (ref 65–99)
Glucose-Capillary: 95 mg/dL (ref 65–99)

## 2016-05-08 LAB — BLOOD GAS, ARTERIAL
Acid-base deficit: 10.5 mmol/L — ABNORMAL HIGH (ref 0.0–2.0)
BICARBONATE: 13.6 mmol/L — AB (ref 20.0–28.0)
Drawn by: 44135
FIO2: 40
LHR: 14 {breaths}/min
O2 Saturation: 98 %
PEEP: 5 cmH2O
PO2 ART: 109 mmHg — AB (ref 83.0–108.0)
Patient temperature: 98.6
VT: 500 mL
pCO2 arterial: 23.7 mmHg — ABNORMAL LOW (ref 32.0–48.0)
pH, Arterial: 7.377 (ref 7.350–7.450)

## 2016-05-08 LAB — CBC
HEMATOCRIT: 24.5 % — AB (ref 36.0–46.0)
HEMOGLOBIN: 8.7 g/dL — AB (ref 12.0–15.0)
MCH: 29.6 pg (ref 26.0–34.0)
MCHC: 35.5 g/dL (ref 30.0–36.0)
MCV: 83.3 fL (ref 78.0–100.0)
Platelets: 176 10*3/uL (ref 150–400)
RBC: 2.94 MIL/uL — ABNORMAL LOW (ref 3.87–5.11)
RDW: 16.1 % — AB (ref 11.5–15.5)
WBC: 18.1 10*3/uL — ABNORMAL HIGH (ref 4.0–10.5)

## 2016-05-08 LAB — BASIC METABOLIC PANEL
Anion gap: 5 (ref 5–15)
BUN: 29 mg/dL — AB (ref 6–20)
CALCIUM: 8.4 mg/dL — AB (ref 8.9–10.3)
CHLORIDE: 121 mmol/L — AB (ref 101–111)
CO2: 18 mmol/L — AB (ref 22–32)
CREATININE: 0.71 mg/dL (ref 0.44–1.00)
GFR calc non Af Amer: >60 mL/min (ref >60)
GLUCOSE: 226 mg/dL — AB (ref 65–99)
Potassium: 3 mmol/L — ABNORMAL LOW (ref 3.5–5.1)
Sodium: 144 mmol/L (ref 135–145)

## 2016-05-08 LAB — PHOSPHORUS: PHOSPHORUS: 2.2 mg/dL — AB (ref 2.5–4.6)

## 2016-05-08 LAB — CULTURE, RESPIRATORY

## 2016-05-08 LAB — MAGNESIUM: Magnesium: 2 mg/dL (ref 1.7–2.4)

## 2016-05-08 LAB — CULTURE, RESPIRATORY W GRAM STAIN: Culture: NORMAL

## 2016-05-08 MED ORDER — HYDROCORTISONE NA SUCCINATE PF 100 MG IJ SOLR
25.0000 mg | Freq: Two times a day (BID) | INTRAMUSCULAR | Status: DC
  Administered 2016-05-08 – 2016-05-09 (×2): 25 mg via INTRAVENOUS
  Filled 2016-05-08 (×2): qty 2

## 2016-05-08 MED ORDER — INSULIN ASPART 100 UNIT/ML ~~LOC~~ SOLN
7.0000 [IU] | Freq: Once | SUBCUTANEOUS | Status: AC
  Administered 2016-05-08: 7 [IU] via SUBCUTANEOUS

## 2016-05-08 MED ORDER — POTASSIUM CHLORIDE 20 MEQ/15ML (10%) PO SOLN
40.0000 meq | ORAL | Status: AC
  Administered 2016-05-08 (×2): 40 meq
  Filled 2016-05-08 (×2): qty 30

## 2016-05-08 MED ORDER — ORAL CARE MOUTH RINSE: 15.0000 mL | Freq: Two times a day (BID) | OROMUCOSAL | Status: DC

## 2016-05-08 NOTE — Progress Notes (Signed)
RT NOTE:  Pt has weaned for 3 hours and tolerated very well. Placed back to full support to allow patient to sleep. RN aware, RT will monitor.

## 2016-05-08 NOTE — Progress Notes (Signed)
RT NOTE:  Pt fully awake. RN stated dayshift RN was told if the patient was fully awake during the night that RT could wean patient for a short period of time. Pt placed in wean CPAP/PS 15/5, 40%. Pt tolerating well @ this time. RT will monitor.

## 2016-05-08 NOTE — Procedures (Signed)
Extubation Procedure Note  Patient Details:   Name: [name] DOB: 07/01/1960 MRN: [id number]   Airway Documentation:     Evaluation  O2 sats: stable throughout Complications: No apparent complications Patient did tolerate procedure well. Bilateral Breath Sounds: Clear, Diminished   Yes  Patient extubated 2L nasal cannula.  Positive cuff leak noted.  No evidence of stridor.  Patient able to speak post extubation.  Sats currently 100%.  Vitals are stable.  No apparent complications.  Philomena Doheny 05/08/2016, 3:13 PM

## 2016-05-08 NOTE — Progress Notes (Signed)
Followed up w/ CCM via elink regarding continued hyperglycemia throughout the night. Informed MD that most recent CBG was 204 following the one time dose of 7 units novolog insulin as well as the 0400 insulin coverage (instructed to give via CCM in previous phone call.)  elink RN stated that MD commented that further changes might be made to insulin coverage during AM shift during/after rounds are completed.  Will inform on coming RN about recent events throughout night and continue to monitor closely.  Guadalupe Maple, RN

## 2016-05-08 NOTE — Progress Notes (Signed)
PULMONARY / CRITICAL CARE MEDICINE   Name: [name] MRN: [id number] DOB: 1960-08-03    ADMISSION DATE:  05/04/2016 CONSULTATION DATE:  05/05/2016  REFERRING MD:  Dr. Wyvonnia Dusky EDP  CHIEF COMPLAINT:  AMS  HISTORY OF PRESENT ILLNESS:   55 year old female with hx MS, CKD III, DM, GERD, HTN, nephrolithiasis, sacral decub, and neuromuscular disorder of bladder. She has a chronic indwelling nephrostomy tube and bilateral hydronephrosis due to obstructing urinary calculi. Recent admission for septic shock from urinary source at which time she was evaluated by urology, cystoscopy did not reveal any evidence of fistula. Plan was for her to undergo nephrolithotomy on 11/30 ( dahlstedt). She was discharged to [facility] with 2 weeks of antibiotics via PICC line, which was discontinued on 11/21.  She was brought in by EMS on 11/22 with altered mental status in the form of lethargy for one day and no urine output. Frank pus was noted both in the Foley and nephrostomy tube, her Foley was coated and feces, she was hypotensive and sepsis protocol was initiated. She was intubated for airway protection, given Zosyn and vancomycin. Initial hemoglobin was 4 but on repeat this was noted to be 11   SUBJECTIVE:  Doing well on PST. Good cough and TV.    VITAL SIGNS: BP 139/74   Pulse 84   Temp 99 F (37.2 C)   Resp (!) 29   Ht 5\' 6"  (1.676 m)   Wt 73.8 kg (162 lb 11.2 oz)   SpO2 100%   BMI 26.26 kg/m   HEMODYNAMICS: CVP:  [6 mmHg-18 mmHg] 18 mmHg  VENTILATOR SETTINGS: Vent Mode: CPAP;PSV FiO2 (%):  [40 %] 40 % Set Rate:  [14 bmp] 14 bmp Vt Set:  [500 mL] 500 mL PEEP:  [5 cmH20] 5 cmH20 Pressure Support:  [5 cmH20] 5 cmH20 Plateau Pressure:  [14 cmH20-16 cmH20] 15 cmH20  INTAKE / OUTPUT: I/O last 3 completed shifts: In: 5928.1 [I.V.:3767.2; NG/GT:1154.2; IV Piggyback:1006.7] Out: 1175 [Urine:1175]  PHYSICAL EXAMINATION: General:  Chronically ill-appearing, well-built, poorly  nourished Neuro:  Awake, alert, follows commands, nods appropriately, MAE, gen weakness  HEENT:  No JVD, dry mucosa Cardiovascular:  S1-S2 tachycardia Lungs:  resps even non labored on vent, doing well on PST.  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  CBC  Recent Labs Lab 05/06/16 0500 05/07/16 0455 05/08/16 0405  WBC 19.1* 22.4* 18.1*  HGB 9.3* 9.0* 8.7*  HCT 26.6* 25.8* 24.5*  PLT 271 205 176    Coag's  Recent Labs Lab 05/04/16 2339 05/05/16 0300  INR 5.20* 2.37    Sepsis Markers  Recent Labs Lab 05/05/16 0555 05/05/16 1730 05/06/16 0130  LATICACIDVEN 4.3* 3.7* 3.9*    ABG  Recent Labs Lab 05/05/16  0330 05/07/16 1430 05/08/16 0540  PHART 7.395 7.441 7.377  PCO2ART 20.3* 21.0* 23.7*  PO2ART 209* 163* 109*    Liver Enzymes  Recent Labs Lab 05/05/16 0032  AST 37  ALT 17  ALKPHOS 37*  BILITOT 1.3*  ALBUMIN 2.2*    Cardiac Enzymes  Recent Labs Lab 05/05/16 0830 05/05/16 1450  TROPONINI 0.17* 0.18*    Glucose  Recent Labs Lab 05/08/16 0145 05/08/16 0307 05/08/16 0422 05/08/16 0537 05/08/16 0800 05/08/16 1155  GLUCAP 238* 228* 209* 204* 189* 169*    Imaging No results found.   STUDIES:  CT renal stone study 11/22 >> Diffuse bladder wall thickening, with complex fluid tracking about the bladder and uterus, but no defined abscess at this  time. Diffuse soft tissue inflammation extends about the lower abdomen and pelvis. This is concerning for diffuse pelvic infection and cystitis. 2. Mild to moderate bilateral hydronephrosis, with a left-sided nephrostomy tube. Large obstructing left ureteral stone measures 1.9 x 1.0 cm, 2 cm above the left renal pelvis. Right-sided hydronephrosis appears to reflect the underlying bladder process. 3. Nonobstructing left renal stones measure up to 1.9 cm in size.  CULTURES: 11/22 urine >>gnr>> EColi  11/22 blood >>  ANTIBIOTICS: 11/22 zosyn>>11/24 11/22 vanc >>11/24 cefriazone 11/24>>>  SIGNIFICANT EVENTS: 11/22 admit   LINES/TUBES: 11/22 ETT >> 11/23 l I j>>   DISCUSSION: Appears to be yet another episode of septic shock, frank pus from nephrostomy indicates that as most likely source She is intubated for airway protection, initial hemoglobin appears to be false-no evidence of upper GI bleed on NG aspirate but note stool positive for occult blood  ASSESSMENT / PLAN:  PULMONARY A: Acute respiratory failure Intubated for airway protection P:   Vent support - 8cc/kg  Doing well on PST. Plan to extubate.    CARDIOVASCULAR A:  Septic shock - improved.  P:  Levophed off  Trend CVP    RENAL   AKI Obstructive hydronephrosis/ pyonephrosis s/p left nephrostomy tube Hypokalemia  P:   Will need urology consult again once stabilized,  She was supposed to get a nephrolithotomy November 30 c/ Urology    GASTROINTESTINAL A:   UGIB- Stool occult blood positive - no further evidence of bleeding  P:   Trend CBC  Hold xarelto - consider resume in am   Add TF    HEMATOLOGIC A:   Chronic DVT, last study 02/2015 Anemia  P:  Hold Xarelto for now-given concern for upper GI bleed Need to reassess on 11/26 re: whether we can resume xarelto. No blood transfusion but anemic.   INFECTIOUS A:   Pyelonephrosis -  sensitive Pseudomonas isolated in 01/2016 EColi UTI P:    Ceftriaxone as above  Follow cultures and simplify as needed  ENDOCRINE A:   At risk for relative adrenal insufficiency   P:   Stress dose steroids > cut down today. Wean off in 24 hrs.   NEUROLOGIC A:   Multiple sclerosis  P:   RASS goal: 0 PRN fentanyl.   FAMILY  - Updates: Updated sister on 11/26  - Inter-disciplinary family meet or Palliative Care meeting due by:  day 7  I have spent 30  minutes of critical care time with this patient today.   Monica Becton, MD 05/08/2016, 12:44 PM [facility] Pager [phone] After 3 pm or if no answer, call [phone]

## 2016-05-08 NOTE — Progress Notes (Signed)
[place] Progress Note Patient Name: [name] DOB: [birth year] MRN: [id number]   Date of Service  05/08/2016  HPI/Events of Note  Hypokalemia  eICU Interventions  Potassium replaced     Intervention Category Intermediate Interventions: Electrolyte abnormality - evaluation and management  DETERDING,ELIZABETH 05/08/2016, 4:37 AM

## 2016-05-08 NOTE — Progress Notes (Signed)
CBG results: 2000 - 203 0000 - 228  After receiving the second high CBG reading and covering with sliding scale insulin - called/notified elink MD for further instruction on how to proceed.  Received orders to rechecked CBG from one hour after giving the novolog insulin for 0000. Therefore, checked CBG directly following phone call at 0145 - CBG - 238.  Called elink MD to notify of most recent CBG and received orders to give 1 time dose of sliding scale insulin that correlates w/ CBG result; therefore 7 additional units will be given to pt.  Will call elink MD to notify of CBG results w/in 1 hour of giving additional insulin.  Guadalupe Maple, RN

## 2016-05-09 DIAGNOSIS — N12 Tubulo-interstitial nephritis, not specified as acute or chronic: Secondary | ICD-10-CM

## 2016-05-09 LAB — GLUCOSE, CAPILLARY
GLUCOSE-CAPILLARY: 96 mg/dL (ref 65–99)
GLUCOSE-CAPILLARY: 70 mg/dL (ref 65–99)
GLUCOSE-CAPILLARY: 136 mg/dL — AB (ref 65–99)
GLUCOSE-CAPILLARY: 169 mg/dL — AB (ref 65–99)
Glucose-Capillary: 137 mg/dL — ABNORMAL HIGH (ref 65–99)

## 2016-05-09 LAB — BASIC METABOLIC PANEL
Anion gap: 4 — ABNORMAL LOW (ref 5–15)
BUN: 22 mg/dL — ABNORMAL HIGH (ref 6–20)
CHLORIDE: 122 mmol/L — AB (ref 101–111)
CO2: 19 mmol/L — ABNORMAL LOW (ref 22–32)
CREATININE: 0.52 mg/dL (ref 0.44–1.00)
Calcium: 8.9 mg/dL (ref 8.9–10.3)
Glucose, Bld: 105 mg/dL — ABNORMAL HIGH (ref 65–99)
Potassium: 3.7 mmol/L (ref 3.5–5.1)
SODIUM: 145 mmol/L (ref 135–145)

## 2016-05-09 LAB — TYPE AND SCREEN
ABO/RH(D): A POS
Antibody Screen: NEGATIVE
UNIT DIVISION: 0
UNIT DIVISION: 0
Unit division: 0
Unit division: 0

## 2016-05-09 LAB — CBC
HCT: 26.3 % — ABNORMAL LOW (ref 36.0–46.0)
HEMOGLOBIN: 8.8 g/dL — AB (ref 12.0–15.0)
MCH: 28.7 pg (ref 26.0–34.0)
MCHC: 33.5 g/dL (ref 30.0–36.0)
MCV: 85.7 fL (ref 78.0–100.0)
PLATELETS: 169 10*3/uL (ref 150–400)
RBC: 3.07 MIL/uL — AB (ref 3.87–5.11)
RDW: 17.1 % — ABNORMAL HIGH (ref 11.5–15.5)
WBC: 15.5 10*3/uL — ABNORMAL HIGH (ref 4.0–10.5)

## 2016-05-09 MED ORDER — SODIUM CHLORIDE 0.9 % IV SOLN: INTRAVENOUS | Status: DC

## 2016-05-09 MED ORDER — INSULIN ASPART 100 UNIT/ML ~~LOC~~ SOLN
0.0000 [IU] | Freq: Three times a day (TID) | SUBCUTANEOUS | Status: DC
  Administered 2016-05-09 – 2016-05-10 (×2): 3 [IU] via SUBCUTANEOUS
  Administered 2016-05-12 (×2): 2 [IU] via SUBCUTANEOUS
  Administered 2016-05-14: 3 [IU] via SUBCUTANEOUS
  Administered 2016-05-14: 2 [IU] via SUBCUTANEOUS
  Administered 2016-05-15 – 2016-05-17 (×4): 3 [IU] via SUBCUTANEOUS

## 2016-05-09 MED ORDER — INSULIN ASPART 100 UNIT/ML ~~LOC~~ SOLN: 0.0000 [IU] | Freq: Every day | SUBCUTANEOUS | Status: DC

## 2016-05-09 NOTE — Care Management Important Message (Signed)
Important Message  Patient Details  Name: [name] MRN: [id number] Date of Birth: 1961/01/12   Medicare Important Message Given:  Other (see comment)    Hugo, Favian Kittleson Abena 05/09/2016, 11:39 AM

## 2016-05-09 NOTE — Progress Notes (Signed)
PULMONARY / CRITICAL CARE MEDICINE   Name: [name] MRN: [id number] DOB: 20-Dec-1960    ADMISSION DATE:  05/04/2016 CONSULTATION DATE:  05/05/2016  REFERRING MD:  Dr. Wyvonnia Dusky EDP  CHIEF COMPLAINT:  AMS  HISTORY OF PRESENT ILLNESS:   55 year old female with hx MS, CKD III, DM, GERD, HTN, nephrolithiasis, sacral decub, and neuromuscular disorder of bladder. She has a chronic indwelling nephrostomy tube and bilateral hydronephrosis due to obstructing urinary calculi. Recent admission for septic shock from urinary source at which time she was evaluated by urology, cystoscopy did not reveal any evidence of fistula. Plan was for her to undergo nephrolithotomy on 11/30 (Dr Diona Fanti). She was discharged to [facility] with 2 weeks of antibiotics via PICC line, which was discontinued on 11/21.  She was brought in by EMS on 11/22 with altered mental status in the form of lethargy for one day and no urine output. Frank pus was noted both in the Foley and nephrostomy tube, her Foley was coated w feces, she was hypotensive and sepsis protocol was initiated. She was intubated for airway protection, given Zosyn and vancomycin. Initial hemoglobin was 4 but on repeat this was noted to be 11   SUBJECTIVE:  Extubated 11/26 Hemodynamics improved.    VITAL SIGNS: BP (!) 152/74   Pulse 65   Temp 98.4 F (36.9 C)   Resp (!) 22   Ht 5\' 6"  (1.676 m)   Wt 73.8 kg (162 lb 11.2 oz)   SpO2 100%   BMI 26.26 kg/m   HEMODYNAMICS: CVP:  [7 mmHg-90 mmHg] 8 mmHg  VENTILATOR SETTINGS:    INTAKE / OUTPUT: I/O last 3 completed shifts: In: 4357.9 [I.V.:2646.7; Other:290; NG/GT:1221.3; IV Piggyback:200] Out: [id number]  PHYSICAL EXAMINATION: General:  Chronically ill-appearing, well-built, poorly nourished Neuro:  Awake, alert, follows commands, nods appropriately, MAE, gen weakness  HEENT:  No JVD, dry mucosa Cardiovascular:  S1-S2 tachycardia Lungs:  resps even non labored on vent, doing  well on PST.  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  LABS:  BMET  Recent Labs Lab 05/07/16 1428 05/08/16 0405 05/09/16 0500  NA 141 144 145  K 3.3* 3.0* 3.7  CL 119* 121* 122*  CO2 14* 18* 19*  BUN 25* 29* 22*  CREATININE 0.86 0.71 0.52  GLUCOSE 180* 226* 105*    Electrolytes  Recent Labs Lab 05/06/16 0500 05/07/16 0455 05/07/16 1428 05/08/16 0405 05/08/16 1800 05/09/16 0500  CALCIUM 8.5* 8.3* 8.1* 8.4*  --  8.9  MG 1.4* 1.5* 2.2  --  2.0  --   PHOS 1.9* 2.2*  --   --  2.2*  --     CBC  Recent Labs Lab 05/07/16 0455 05/08/16 0405 05/09/16 0500  WBC 22.4* 18.1* 15.5*  HGB 9.0* 8.7* 8.8*  HCT 25.8* 24.5* 26.3*  PLT 205 176 169    Coag's  Recent Labs Lab 05/04/16 2339 05/05/16 0300  INR 5.20* 2.37    Sepsis Markers  Recent Labs Lab 05/05/16 0555 05/05/16 1730 05/06/16 0130  LATICACIDVEN 4.3* 3.7* 3.9*    ABG  Recent Labs Lab 05/05/16 0330 05/07/16 1430 05/08/16 0540  PHART 7.395 7.441 7.377  PCO2ART 20.3* 21.0* 23.7*  PO2ART 209* 163* 109*    Liver Enzymes  Recent Labs Lab 05/05/16 0032  AST 37  ALT 17  ALKPHOS 37*  BILITOT 1.3*  ALBUMIN 2.2*    Cardiac Enzymes  Recent Labs Lab 05/05/16 0830 05/05/16 1450  TROPONINI 0.17* 0.18*    Glucose  Recent Labs Lab 05/08/16 1533 05/08/16 2012 05/08/16 2338 05/09/16 0402 05/09/16 0838 05/09/16 1247  GLUCAP 145* 112* 95 96 70 136*    Imaging No results found.   STUDIES:  CT renal stone study 11/22 >> Diffuse bladder wall thickening, with complex fluid tracking about the bladder and uterus, but no defined abscess at this time. Diffuse soft tissue inflammation extends about the lower abdomen and pelvis. This is concerning for diffuse pelvic infection and cystitis. 2. Mild to moderate bilateral hydronephrosis, with a left-sided nephrostomy tube. Large obstructing  left ureteral stone measures 1.9 x 1.0 cm, 2 cm above the left renal pelvis. Right-sided hydronephrosis appears to reflect the underlying bladder process. 3. Nonobstructing left renal stones measure up to 1.9 cm in size.  CULTURES: 11/22 urine >>gnr>> [facility]  11/22 blood >>  ANTIBIOTICS: 11/22 zosyn>>11/24 11/22 vanc >>11/24 cefriazone 11/24>>>  SIGNIFICANT EVENTS: 11/22 admit   LINES/TUBES: 11/22 ETT >> 11/23 l I j>>   DISCUSSION: Appears to be yet another episode of septic shock, frank pus from nephrostomy indicates that as most likely source She is intubated for airway protection, initial hemoglobin appears to be false-no evidence of upper GI bleed on NG aspirate but note stool positive for occult blood  ASSESSMENT / PLAN:  PULMONARY A: Acute respiratory failure Intubated for airway protection P:   Pulmonary hygiene   CARDIOVASCULAR A:  Septic shock - resolved.  P:  Levophed off  Trend CVP   RENAL   AKI Obstructive hydronephrosis/ pyonephrosis s/p left nephrostomy tube Hypokalemia  P:   Will consult urology this admission, suspect that this illness will delay plans for lithotomy    GASTROINTESTINAL A:   UGIB- Stool occult blood positive - no further evidence of bleeding  P:   Trend CBC  Hold xarelto   HEMATOLOGIC A:   Chronic DVT, last study 02/2015 Anemia  P:  Will plan restart xarelto prior to d/c home, follow CBC to insure Hgb stable.   INFECTIOUS A:   Pyelonephrosis -  sensitive Pseudomonas isolated in 01/2016 EColi UTI Coag neg staph 1 of 2 blood cx P:   Ceftriaxone as above  Defer rx for coag neg staph for now  ENDOCRINE A:   At risk for relative adrenal insufficiency   P:   Stress dose steroids > stop 11/27  NEUROLOGIC A:   Multiple sclerosis  P:   RASS goal: 0 PRN fentanyl.   FAMILY  - Updates: Updated sister on 11/26  - Inter-disciplinary family meet or Palliative Care meeting due by:  day 7   Baltazar Apo, MD,  PhD 05/09/2016, 1:07 PM [facility] [phone] or if no answer [phone]

## 2016-05-09 NOTE — Progress Notes (Signed)
Nutrition Follow-up  INTERVENTION:   Ensure Enlive po BID, each supplement provides 350 kcal and 20 grams of protein  NUTRITION DIAGNOSIS:   Increased nutrient needs related to wound healing as evidenced by estimated needs. Ongoing.   GOAL:   Patient will meet greater than or equal to 90% of their needs Progressing.   MONITOR:   PO intake, Supplement acceptance, Diet advancement, I & O's, Skin  ASSESSMENT:   55 y/o female PMHx MS, CKD3, DM, GERD,  CVA, HTN, nephrolithiasis, sacral decub. Presented w/ AMS, lethargy, and 1 day with no urine output. Worked up for Septic Shock. Intubated for airway protection.  Pt discussed during ICU rounds and with RN.   11/26 extubated and started on clear liquids Pt somewhat confused and unable to answer questions. Per friend at bedside pt has a good appetite and was eating well PTA. MD had ordered ensure at SNF prior to this admission. Pt agreeable to supplements. No problems eating but eats slow per friend.   Labs and meds reviewed Pyelonephrosis, EColi UTI, on abx  Diet Order:  Diet clear liquid Room service appropriate? Yes; Fluid consistency: Thin  Skin:  Wound (see comment) (stage I buttocks, stage II sacrum)  Last BM:  11/26 diarrhea  Height:   Ht Readings from Last 1 Encounters:  05/05/16 5\' 6"  (1.676 m)    Weight:   Wt Readings from Last 1 Encounters:  05/08/16 162 lb 11.2 oz (73.8 kg)    Ideal Body Weight:  59.1 kg  BMI:  Body mass index is 26.26 kg/m.  Estimated Nutritional Needs:   Kcal:  1700-1900  Protein:  95-110 grams  Fluid:  >1.7 L/day  EDUCATION NEEDS:   No education needs identified at this time  [place], [place], [place] Pager [phone] After Hours Pager

## 2016-05-09 NOTE — Progress Notes (Signed)
Report given to [place] on [address].

## 2016-05-09 NOTE — Care Management Note (Signed)
Case Management Note  Patient Details  Name: [name] MRN: [id number] Date of Birth: 11/23/1960  Subjective/Objective: Pt admitted on 05/04/16 with septic shock, urosepsis.  PTA, pt resided at [facility].                      Action/Plan: Met with pt and sister; they state that pt would like to return to [facility] at [facility].  Will need PT/OT consults prior to dc.  Will consult CSW to facilitate return to SNF.  Expected Discharge Date:                  Expected Discharge Plan:  Skilled Nursing Facility  In-House Referral:  Clinical Social Work  Discharge planning Services     Post Acute Care Choice:    Choice offered to:     DME Arranged:    DME Agency:     HH Arranged:    [place] Agency:     Status of Service:  In process, will continue to follow  If discussed at Long Length of Stay Meetings, dates discussed:    Additional Comments:  Reinaldo Raddle, RN, BSN  Trauma/Neuro ICU Case Manager [phone]

## 2016-05-10 LAB — GLUCOSE, CAPILLARY
GLUCOSE-CAPILLARY: 95 mg/dL (ref 65–99)
GLUCOSE-CAPILLARY: 157 mg/dL — AB (ref 65–99)
Glucose-Capillary: 120 mg/dL — ABNORMAL HIGH (ref 65–99)
Glucose-Capillary: 102 mg/dL — ABNORMAL HIGH (ref 65–99)

## 2016-05-10 LAB — CULTURE, BLOOD (ROUTINE X 2): Culture: NO GROWTH

## 2016-05-10 MED ORDER — ENSURE ENLIVE PO LIQD
237.0000 mL | Freq: Two times a day (BID) | ORAL | Status: DC
  Administered 2016-05-10 – 2016-05-17 (×13): 237 mL via ORAL

## 2016-05-10 MED ORDER — FAMOTIDINE 20 MG PO TABS
20.0000 mg | ORAL_TABLET | Freq: Two times a day (BID) | ORAL | Status: DC
  Administered 2016-05-10 – 2016-05-17 (×15): 20 mg via ORAL
  Filled 2016-05-10 (×15): qty 1

## 2016-05-10 NOTE — Clinical Social Work Note (Signed)
CSW called and left voicemail for patient's cousin, [name], for assessment and to discuss discharge plan. Per admissions coordinator at [facility], she is a long-term resident but they need a denial from her primary Medicare plan in order to take her back without coverage. Admissions coordinator will handle that. PT/OT order put in today.  Dayton Scrape, [place]

## 2016-05-10 NOTE — Progress Notes (Signed)
Patient ID: [name], female   DOB: March 19, 1961, 55 y.o.   MRN: [id number]    PROGRESS NOTE    [name]  [id number] DOB: 1960/12/10 DOA: 05/04/2016  PCP: Blanchie Serve, MD   Brief Narrative:  55 year old female with hx MS, CKD III, DM, GERD, HTN, nephrolithiasis, sacral decub, and neuromuscular disorder of bladder. She has a chronic indwelling nephrostomy tube and bilateral hydronephrosis due to obstructing urinary calculi. Recent admission for septic shock from urinary source at which time she was evaluated by urology, cystoscopy did not reveal any evidence of fistula. Plan was for her to undergo nephrolithotomy on 11/30 (Dr Diona Fanti). She was discharged to [facility] with 2 weeks of antibiotics via PICC line, which was discontinued on 11/21.  She was brought in by EMS on 11/22 with altered mental status in the form of lethargy for one day and no urine output. Frank pus was noted both in the Foley and nephrostomy tube, her Foley was coated w feces, she was hypotensive and sepsis protocol was initiated. She was intubated for airway protection, given Zosyn and vancomycin. Initial hemoglobin was 4 but on repeat this was noted to be 11. PCCM admitted pt and pt extubated on 11/26, TRH assumed care 11/28.   Assessment & Plan:   Acute respiratory failure Intubated for airway protection, extubated 11/26 - Pulmonary hygiene - currently pt maintaining oxygen saturations at target range  - no respiratory distress   Septic shock, sepsis secondary to pelvic infection, cystitis, E. Coli UTI - Coag neg staph 1 of 2 blood cx likely contaminant  - Ceftriaxone as above as sensitivity report indicate resistance to multiple PO ABX - Defer rx for coag neg staph for now - WBC trending down     AKI, metabolic acidosis  - Obstructive hydronephrosis/ pyonephrosis s/p left nephrostomy tube  - per PCCM, plan to consult urology this admission, suspect that this illness will delay plans for  lithotomy  - Cr is now WNL  - BMP in AM  UGIB, anemia of chronic disease  - Stool occult blood positive - no further evidence of bleeding  - Hg overall stable, CBC in AM - Holding xarelto for now, suspect can resume once no further drops in Hg and pt able to tolerate PO   Hypokalemia - supplemented and WNL this AM - BMP In AM  Chronic DVT, last study 02/2015 - plan to restart xarelto prior to d/c home, follow CBC to insure Hgb stable.   At risk for relative adrenal insufficiency   - Stress dose steroids > stop 11/27  Multiple sclerosis  - stable for now  DVT prophylaxis: SCD's, resume Xarelto once ensured Hg stable  Code Status: Full  Family Communication: Patient at bedside  Disposition Plan: To be determined, PT/OT eval pending to determined appropriate d/c plan   CONSULTANTS:   None  STUDIES:   CT renal stone study 11/22 >> Diffuse bladder wall thickening, with complex fluid tracking the bladder and uterus, but no defined abscess at this time. Diffuse soft tissue inflammation extends about the lower abdomen and pelvis. This is concerning for diffuse pelvic infection and cystitis. Mild to moderate bilateral hydronephrosis, with a left-sided nephrostomy tube. Large obstructing left ureteral stone measures 1.9 x 1.0 cm, 2 cm above the left renal pelvis. Right-sided hydronephrosis appears to reflect the underlying bladder process. Nonobstructing left renal stones measure up to 1.9 cm in size.  LINES/TUBES:  11/22 ETT >>  11/23 l I j>>  ANTIBIOTICS:  11/22 zosyn>>11/24  11/22 vanc >>11/24  cefriazone 11/24>>>  CULTURES:  11/22 urine >>gnr>> EColi   11/22 blood >>  Subjective: More confused this AM, no chest pain or dyspnea reported.   Objective: Vitals:   05/09/16 1448 05/09/16 2235 05/10/16 0500 05/10/16 1443  BP: (!) 153/78 (!) 153/65 (!) 150/81 (!) 154/77  Pulse: 93 73 88 86  Resp: 20 (!) 22 19 19   Temp: 97.9 F (36.6 C) 98.6 F (37 C) 98.4 F  (36.9 C) 98.5 F (36.9 C)  TempSrc: Oral Oral  Oral  SpO2: 100% 95% 100% 100%  Weight: 74.1 kg (163 lb 5.8 oz)   74.7 kg (164 lb 10.9 oz)  Height: 5' 5.5" (1.664 m)       Intake/Output Summary (Last 24 hours) at 05/10/16 1844 Last data filed at 05/10/16 1444  Gross per 24 hour  Intake              460 ml  Output             1850 ml  Net            -1390 ml   Filed Weights   05/08/16 0600 05/09/16 1448 05/10/16 1443  Weight: 73.8 kg (162 lb 11.2 oz) 74.1 kg (163 lb 5.8 oz) 74.7 kg (164 lb 10.9 oz)    Examination: General:  Chronically ill-appearing, well-built, poorly nourished Neuro:  Awake, alert, follows commands, confused, hallucinating this am  HEENT:  No JVD, dry mucosa Cardiovascular:  S1-S2 tachycardia Lungs:  resps even non labored on vent, doing well on PST.  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  Data Reviewed: I have personally reviewed following labs and imaging studies  CBC:  Recent Labs Lab 05/04/16 2339 05/05/16 0032 05/05/16 0300 05/06/16 0500 05/07/16 0455 05/08/16 0405 05/09/16 0500  WBC 4.6 8.8 10.2 19.1* 22.4* 18.1* 15.5*  NEUTROABS 4.0 7.8*  --   --   --   --   --   HGB 4.3* 11.4* 11.2* 9.3* 9.0* 8.7* 8.8*  HCT 14.1* 34.8* 33.3* 26.6* 25.8* 24.5* 26.3*  MCV 91.0 89.5 88.1 83.6 83.0 83.3 85.7  PLT 178 258 310 271 205 176 [id number]   Basic Metabolic Panel:  Recent Labs Lab 05/05/16 0300  05/06/16 0500 05/07/16 0455 05/07/16 1428 05/08/16 0405 05/08/16 1800 05/09/16 0500  NA 139  < > 143 142 141 144  --  145  K 3.7  < > 2.6* 2.3* 3.3* 3.0*  --  3.7  CL 112*  < > 117* 117* 119* 121*  --  122*  CO2 13*  < > 15* 17* 14* 18*  --  19*  GLUCOSE 176*  < > 154* 141* 180* 226*  --  105*  BUN 57*  < > 36* 25* 25* 29*  --  22*  CREATININE 2.78*  < > 1.15* 0.83 0.86 0.71  --  0.52  CALCIUM 8.2*  < > 8.5* 8.3* 8.1* 8.4*  --  8.9  MG 1.3*  --  1.4* 1.5*  2.2  --  2.0  --   PHOS 3.4  --  1.9* 2.2*  --   --  2.2*  --   < > = values in this interval not displayed.  Liver Function Tests:  Recent Labs Lab 05/05/16 0032  AST 37  ALT 17  ALKPHOS 37*  BILITOT 1.3*  PROT 5.3*  ALBUMIN 2.2*  Coagulation Profile:  Recent Labs Lab 05/04/16 2339 05/05/16 0300  INR 5.20* 2.37   Cardiac Enzymes:  Recent Labs Lab 05/05/16 0830 05/05/16 1450  TROPONINI 0.17* 0.18*   CBG:  Recent Labs Lab 05/09/16 1636 05/09/16 2233 05/10/16 0751 05/10/16 1159 05/10/16 1718  GLUCAP 169* 137* 95 157* 120*   Urine analysis:    Component Value Date/Time   COLORURINE STRAW (A) 05/04/2016 2321   APPEARANCEUR TURBID (A) 05/04/2016 2321   LABSPEC 1.013 05/04/2016 2321   PHURINE 6.0 05/04/2016 2321   GLUCOSEU NEGATIVE 05/04/2016 2321   HGBUR LARGE (A) 05/04/2016 2321   BILIRUBINUR NEGATIVE 05/04/2016 2321   KETONESUR 15 (A) 05/04/2016 2321   PROTEINUR 100 (A) 05/04/2016 2321   UROBILINOGEN 0.2 12/20/2014 1851   NITRITE NEGATIVE 05/04/2016 2321   LEUKOCYTESUR LARGE (A) 05/04/2016 2321   Recent Results (from the past 240 hour(s))  Urine culture     Status: Abnormal   Collection Time: 05/04/16 11:21 PM  Result Value Ref Range Status   Specimen Description URINE, RANDOM  Final   Special Requests NONE  Final   Culture 60,000 COLONIES/mL ESCHERICHIA COLI (A)  Final   Report Status 05/07/2016 FINAL  Final   Organism ID, Bacteria ESCHERICHIA COLI (A)  Final      Susceptibility   Escherichia coli - MIC*    AMPICILLIN >=32 RESISTANT Resistant     CEFAZOLIN <=4 SENSITIVE Sensitive     CEFTRIAXONE <=1 SENSITIVE Sensitive     CIPROFLOXACIN >=4 RESISTANT Resistant     GENTAMICIN <=1 SENSITIVE Sensitive     IMIPENEM <=0.25 SENSITIVE Sensitive     NITROFURANTOIN <=16 SENSITIVE Sensitive     TRIMETH/SULFA >=320 RESISTANT Resistant     AMPICILLIN/SULBACTAM >=32 RESISTANT Resistant     PIP/TAZO <=4 SENSITIVE Sensitive     Extended ESBL NEGATIVE  Sensitive     * 60,000 COLONIES/mL ESCHERICHIA COLI  Blood Culture (routine x 2)     Status: None   Collection Time: 05/04/16 11:40 PM  Result Value Ref Range Status   Specimen Description BLOOD LEFT ARM  Final   Special Requests IN PEDIATRIC BOTTLE 3ML  Final   Culture NO GROWTH 5 DAYS  Final   Report Status 05/10/2016 FINAL  Final  Blood Culture (routine x 2)     Status: Abnormal   Collection Time: 05/04/16 11:49 PM  Result Value Ref Range Status   Specimen Description BLOOD RIGHT ARM  Final   Special Requests AEROBIC BOTTLE ONLY 5ML  Final   Culture  Setup Time   Final    GRAM POSITIVE COCCI IN CLUSTERS AEROBIC BOTTLE ONLY Organism ID to follow CRITICAL RESULT CALLED TO, READ BACK BY AND VERIFIED WITH: Leatha Gilding.D. 21:25 05/05/16 ([email or web address])    Culture (A)  Final    STAPHYLOCOCCUS SPECIES (COAGULASE NEGATIVE) THE SIGNIFICANCE OF ISOLATING THIS ORGANISM FROM A SINGLE SET OF BLOOD CULTURES WHEN MULTIPLE SETS ARE DRAWN IS UNCERTAIN. PLEASE NOTIFY THE MICROBIOLOGY DEPARTMENT WITHIN ONE WEEK IF SPECIATION AND SENSITIVITIES ARE REQUIRED.    Report Status 05/07/2016 FINAL  Final  Blood Culture ID Panel (Reflexed)     Status: Abnormal   Collection Time: 05/04/16 11:49 PM  Result Value Ref Range Status   Enterococcus species NOT DETECTED NOT DETECTED Final   Listeria monocytogenes NOT DETECTED NOT DETECTED Final   Staphylococcus species DETECTED (A) NOT DETECTED Final    Comment: CRITICAL RESULT CALLED TO, READ BACK BY AND VERIFIED WITH: Leatha Gilding.D. 21:25 05/05/16 ([email or web address])  Staphylococcus aureus NOT DETECTED NOT DETECTED Final   Methicillin resistance DETECTED (A) NOT DETECTED Final    Comment: CRITICAL RESULT CALLED TO, READ BACK BY AND VERIFIED WITH: Leatha Gilding.D. 21:25 05/05/16 ([email or web address])    Streptococcus species NOT DETECTED NOT DETECTED Final   Streptococcus agalactiae NOT DETECTED NOT DETECTED Final   Streptococcus pneumoniae NOT DETECTED NOT DETECTED Final    Streptococcus pyogenes NOT DETECTED NOT DETECTED Final   Acinetobacter baumannii NOT DETECTED NOT DETECTED Final   Enterobacteriaceae species NOT DETECTED NOT DETECTED Final   Enterobacter cloacae complex NOT DETECTED NOT DETECTED Final   Escherichia coli NOT DETECTED NOT DETECTED Final   Klebsiella oxytoca NOT DETECTED NOT DETECTED Final   Klebsiella pneumoniae NOT DETECTED NOT DETECTED Final   Proteus species NOT DETECTED NOT DETECTED Final   Serratia marcescens NOT DETECTED NOT DETECTED Final   Haemophilus influenzae NOT DETECTED NOT DETECTED Final   Neisseria meningitidis NOT DETECTED NOT DETECTED Final   Pseudomonas aeruginosa NOT DETECTED NOT DETECTED Final   Candida albicans NOT DETECTED NOT DETECTED Final   Candida glabrata NOT DETECTED NOT DETECTED Final   Candida krusei NOT DETECTED NOT DETECTED Final   Candida parapsilosis NOT DETECTED NOT DETECTED Final   Candida tropicalis NOT DETECTED NOT DETECTED Final  MRSA culture     Status: None   Collection Time: 05/05/16  7:39 AM  Result Value Ref Range Status   Specimen Description NASAL SWAB  Final   Special Requests NONE  Final   Culture NO MRSA DETECTED  Final   Report Status 05/07/2016 FINAL  Final  Culture, respiratory (NON-Expectorated)     Status: None   Collection Time: 05/05/16  4:30 PM  Result Value Ref Range Status   Specimen Description TRACHEAL ASPIRATE  Final   Special Requests NONE  Final   Gram Stain   Final    MODERATE WBC PRESENT,BOTH PMN AND MONONUCLEAR NO ORGANISMS SEEN    Culture Consistent with normal respiratory flora.  Final   Report Status 05/08/2016 FINAL  Final  C difficile quick scan w PCR reflex     Status: None   Collection Time: 05/05/16  4:50 PM  Result Value Ref Range Status   C Diff antigen NEGATIVE NEGATIVE Final   C Diff toxin NEGATIVE NEGATIVE Final   C Diff interpretation No C. difficile detected.  Final    Radiology Studies: No results found.  Scheduled Meds: . sodium chloride   10 mL/hr Intravenous Once  . cefTRIAXone (ROCEPHIN)  IV  1 g Intravenous Q24H  . famotidine  20 mg Oral BID  . feeding supplement (ENSURE ENLIVE)  237 mL Oral BID BM  . insulin aspart  0-15 Units Subcutaneous TID WC  . insulin aspart  0-5 Units Subcutaneous QHS  . sodium chloride  1,000 mL Intravenous Once   Continuous Infusions: . sodium chloride 10 mL/hr at 05/09/16 1900     LOS: 5 days   Time spent: 20 minutes   Patricia Ramsay, MD [facility] Pager [phone]  If 7PM-7AM, please contact night-coverage [email or web address] Password [email or web address] 05/10/2016, 6:44 PM

## 2016-05-11 LAB — GLUCOSE, CAPILLARY
GLUCOSE-CAPILLARY: 153 mg/dL — AB (ref 65–99)
Glucose-Capillary: 129 mg/dL — ABNORMAL HIGH (ref 65–99)
Glucose-Capillary: 156 mg/dL — ABNORMAL HIGH (ref 65–99)

## 2016-05-11 NOTE — Progress Notes (Signed)
Patient refused blood draw and CBG check. Dr. Adair Patter notified.

## 2016-05-11 NOTE — Evaluation (Signed)
Occupational Therapy Evaluation Patient Details Name: [name] MRN: [id number] DOB: 1960/08/31 Today's Date: 05/11/2016    History of Present Illness 55 year old female with hx MS, CKD III, DM, GERD, HTN, nephrolithiasis, sacral decub, and neuromuscular disorder of bladder. She has a chronic indwelling nephrostomy tube and bilateral hydronephrosis due to obstructing urinary calculi. Recent admission for septic shock from urinary source at which time she was evaluated by urology, cystoscopy did not reveal any evidence of fistula. Plan was for her to undergo nephrolithotomy on 11/30 (Dr Diona Fanti).    Clinical Impression   PTA, pt has been a long-term resident of [facility]. Pt required total care at baseline for ADL and mobility but was able to feed herself with her L arm at times. She transfers with a Harrel Lemon lift to power wheelchair. Pt functioning at baseline on OT eval. Pt has no acute OT needs at this time and any further needs can be met at [facility]. OT will sign off.    Follow Up Recommendations  SNF;Supervision/Assistance - 24 hour    Equipment Recommendations  Other (comment) (Defer to next venue of care)    Recommendations for Other Services       Precautions / Restrictions Precautions Precautions: Fall Restrictions Weight Bearing Restrictions: No      Mobility Bed Mobility Overal bed mobility: Needs Assistance;+2 for physical assistance Bed Mobility: Supine to Sit     Supine to sit: Total assist;+2 for physical assistance;HOB elevated     General bed mobility comments: Pt completely dependent at baseline.  Transfers                      Balance Overall balance assessment: Needs assistance Sitting-balance support: Bilateral upper extremity supported;Feet supported Sitting balance-Leahy Scale: Zero Sitting balance - Comments: Pt unable to sit EOB with poor balance/ poor postural stability.  Leans to left and posterior.  Postural control: Posterior  lean;Left lateral lean                                  ADL Overall ADL's : Needs assistance/impaired Eating/Feeding: Minimal assistance;Set up;Supervision/ safety;Bed level   Grooming: Wash/dry face;Bed level;Minimal assistance   Upper Body Bathing: Total assistance;Bed level   Lower Body Bathing: Total assistance;Bed level   Upper Body Dressing : Total assistance;Bed level   Lower Body Dressing: Total assistance;Bed level               Functional mobility during ADLs: Total assistance;+2 for physical assistance (Uses hoyer lift at [facility] General ADL Comments: Pt dependent in UB/LB dressing and bathing. Pt able to feed herself some and able to wash face with set-up and min assist. Pt does not have functional use of L UE.     Vision Vision Assessment?: No apparent visual deficits   Perception     Praxis      Pertinent Vitals/Pain Pain Assessment: Faces Faces Pain Scale: Hurts little more Pain Location: generalized Pain Descriptors / Indicators: Aching;Sore Pain Intervention(s): Limited activity within patient's tolerance;Repositioned     Hand Dominance Right   Extremity/Trunk Assessment Upper Extremity Assessment Upper Extremity Assessment: RUE deficits/detail;LUE deficits/detail;Generalized weakness RUE Deficits / Details: Able to move R fingers only slightly and unable to complete full digit extension or flexion. Able to complete approximately 10 degrees of elbow flexion from fully extended position. Able to complete approximately 0-45 degrees of passive R shoulder flexion. LUE  Deficits / Details: Greater movement as compared to R and able to complete functional hand to mouth movements. Decreased shoulder ROM to approximately 0-100 degrees and overall strength. Able to complete full fist.   Lower Extremity Assessment Lower Extremity Assessment: Defer to PT evaluation RLE Deficits / Details: 0/5 grossly LLE Deficits / Details: 0/5 grossly    Cervical / Trunk Assessment Cervical / Trunk Assessment: Kyphotic   Communication Communication Communication: No difficulties   Cognition Arousal/Alertness: Awake/alert Behavior During Therapy: Flat affect Overall Cognitive Status: No family/caregiver present to determine baseline cognitive functioning (Per PT: history of cognitive impairments at baseline)       Memory: Decreased short-term memory             General Comments       Exercises Exercises: General Lower Extremity     Shoulder Instructions      Home Living Family/patient expects to be discharged to:: Skilled nursing facility                                 Additional Comments: hoyer lift to power chair at SNF      Prior Functioning/Environment Level of Independence: Needs assistance  Gait / Transfers Assistance Needed: Per PT note (who discussed with facility) "Per Hiral,PT at  [facility], pt was being lifted via hoyer lift to her power wheelchair by staff.  PT was not following pt as nursing following restorative program." ADL's / Homemaking Assistance Needed: Total assist for all ADLs except can use left UE for some feeding.    Comments: PLOF information from discussion with PT as pt not a good historian on PLOF and no family present.        OT Problem List: Decreased strength;Decreased range of motion;Decreased activity tolerance;Impaired balance (sitting and/or standing);Decreased safety awareness;Decreased knowledge of use of DME or AE;Decreased knowledge of precautions;Pain   OT Treatment/Interventions:      OT Goals(Current goals can be found in the care plan section) Acute Rehab OT Goals Patient Stated Goal: Unable to state OT Goal Formulation: With patient Time For Goal Achievement: 05/25/16 Potential to Achieve Goals: Fair  OT Frequency:     Barriers to D/C:            Co-evaluation              End of Session    Activity Tolerance: Patient limited by  lethargy Patient left: in bed;with call bell/phone within reach   Time: 1457-1510 OT Time Calculation (min): 13 min Charges:  OT General Charges $OT Visit: 1 Procedure OT Evaluation $OT Eval Moderate Complexity: 1 Procedure G-Codes:    Tavarius Grewe A Karisma Meiser 2016/05/25, 4:13 PM

## 2016-05-11 NOTE — Progress Notes (Signed)
PROGRESS NOTE    [name]  [id number] DOB: 07-Mar-1961 DOA: 05/04/2016 PCP: Blanchie Serve, MD    Brief Narrative:  55 year old female with hx MS, CKD III, DM, GERD, HTN, nephrolithiasis, sacral decub, and neuromuscular disorder of bladder. She has a chronic indwelling nephrostomy tube and bilateral hydronephrosis due to obstructing urinary calculi. Recent admission for septic shock from urinary source at which time she was evaluated by urology, cystoscopy did not reveal any evidence of fistula. Plan was for her to undergo nephrolithotomy on 11/30 (Dr Diona Fanti). She was discharged to [facility] with 2 weeks of antibiotics via PICC line, which was discontinued on 11/21.  She was brought in by EMS on 11/22 with altered mental status in the form of lethargy for one day and no urine output. Frank pus was noted both in the Foley and nephrostomy tube, her Foley was coated wfeces, she was hypotensive and sepsis protocol was initiated. She was intubated for airway protection, given Zosyn and vancomycin. Initial hemoglobin was 4 but on repeat this was noted to be 11. PCCM admitted pt and pt extubated on 11/26, [facility] assumed care 11/28   Assessment & Plan:   Active Problems:   Septic shock (HCC)   Acute respiratory failure with hypoxia (HCC)   Acute respiratory failure Intubated for airway protection, extubated 11/26 - Pulmonary hygiene - currently pt maintaining oxygen saturations at target range  - no respiratory distress   Septic shock, sepsis secondary to pelvic infection, cystitis, E. Coli UTI - Coag neg staph 1 of 2 blood cx likely contaminant  - Ceftriaxone as above as sensitivity report indicate resistance to multiple PO ABX - Defer rx for coag neg staph for now - WBC still trending down   AKI, metabolic acidosis  - Obstructive hydronephrosis/ pyonephrosis s/p left nephrostomy tube  - per PCCM, plan to consult urology this admission, suspect that this illness will delay  plans for lithotomy  - Cr is now WNL  - BMP in AM - Urology consulted   UGIB, anemia of chronic disease  - Stool occult blood positive - no further evidence of bleeding  - Hg overall stable, CBC in AM - Holding xarelto for now - will restart in am if H/H stable  Hypokalemia - supplemented and WNL this AM - BMP In AM  Chronic DVT, last study 02/2015 - plan to restart xarelto prior to d/c home - follow CBC to insure Hgb stable - repeat CBC in am  At risk for relative adrenal insufficiency  - Stress dose steroids >stop 11/27  Multiple sclerosis  - stable for now  DVT prophylaxis: SCD's, resume Xarelto once ensured Hg stable  Code Status: Full  Family Communication: Patient at bedside  Disposition Plan: pending clearance by Urology    Consultants:   PCCM  Urology  Procedures:   Intubation  Antimicrobials:  ? 11/22 zosyn>>11/24 ? 11/22 vanc >>11/24  cefriazone 11/24>>>    Subjective: Patient seen and evaluated.  She voices that she does not believe any of the urology doctors have seen her during her hospitalization yet.  She is open to urology seeing her.  Called urology and patient seen 5x during October in their office.  Objective: Vitals:   05/10/16 1443 05/10/16 2046 05/11/16 0602 05/11/16 1445  BP: (!) 154/77 (!) 148/69 (!) 159/64 (!) 149/74  Pulse: 86 91 81 84  Resp: 19 18 18 18   Temp: 98.5 F (36.9 C) 98.6 F (37 C) 98.1 F (36.7 C) 98.3  F (36.8 C)  TempSrc: Oral Oral Oral Oral  SpO2: 100% 100% 100% 100%  Weight: 74.7 kg (164 lb 10.9 oz)  71.8 kg (158 lb 4.6 oz)   Height:        Intake/Output Summary (Last 24 hours) at 05/11/16 1602 Last data filed at 05/11/16 1500  Gross per 24 hour  Intake             1057 ml  Output             1450 ml  Net             -393 ml   Filed Weights   05/09/16 1448 05/10/16 1443 05/11/16 0602  Weight: 74.1 kg (163 lb 5.8 oz) 74.7 kg (164 lb 10.9 oz) 71.8 kg (158 lb 4.6 oz)     Examination:  General exam: Appears calm and comfortable  Respiratory system: Clear to auscultation. Respiratory effort normal. Cardiovascular system: S1 & S2 heard, RRR. No JVD, murmurs, rubs, gallops or clicks. No pedal edema. Gastrointestinal system: Abdomen is nondistended, soft and nontender. No organomegaly or masses felt. Normal bowel sounds heard. Nephrostomy tube draining clear urine Central nervous system: Alert and oriented to self- able to answer questions, somewhat confused as to what has happened during hospitalization. No focal neurological deficits. Extremities: Symmetric 5 x 5 power. Skin: No rashes, lesions or ulcers, bilateral skin thickening of lower extremities bilaterally      Data Reviewed: I have personally reviewed following labs and imaging studies  CBC:  Recent Labs Lab 05/04/16 2339 05/05/16 0032 05/05/16 0300 05/06/16 0500 05/07/16 0455 05/08/16 0405 05/09/16 0500  WBC 4.6 8.8 10.2 19.1* 22.4* 18.1* 15.5*  NEUTROABS 4.0 7.8*  --   --   --   --   --   HGB 4.3* 11.4* 11.2* 9.3* 9.0* 8.7* 8.8*  HCT 14.1* 34.8* 33.3* 26.6* 25.8* 24.5* 26.3*  MCV 91.0 89.5 88.1 83.6 83.0 83.3 85.7  PLT 178 258 310 271 205 176 [id number]   Basic Metabolic Panel:  Recent Labs Lab 05/05/16 0300  05/06/16 0500 05/07/16 0455 05/07/16 1428 05/08/16 0405 05/08/16 1800 05/09/16 0500  NA 139  < > 143 142 141 144  --  145  K 3.7  < > 2.6* 2.3* 3.3* 3.0*  --  3.7  CL 112*  < > 117* 117* 119* 121*  --  122*  CO2 13*  < > 15* 17* 14* 18*  --  19*  GLUCOSE 176*  < > 154* 141* 180* 226*  --  105*  BUN 57*  < > 36* 25* 25* 29*  --  22*  CREATININE 2.78*  < > 1.15* 0.83 0.86 0.71  --  0.52  CALCIUM 8.2*  < > 8.5* 8.3* 8.1* 8.4*  --  8.9  MG 1.3*  --  1.4* 1.5* 2.2  --  2.0  --   PHOS 3.4  --  1.9* 2.2*  --   --  2.2*  --   < > = values in this interval not displayed. GFR: Estimated Creatinine Clearance: 77.9 mL/min (by C-G formula based on SCr of 0.52 mg/dL). Liver  Function Tests:  Recent Labs Lab 05/05/16 0032  AST 37  ALT 17  ALKPHOS 37*  BILITOT 1.3*  PROT 5.3*  ALBUMIN 2.2*   No results for input(s): LIPASE, AMYLASE in the last 168 hours. No results for input(s): AMMONIA in the last 168 hours. Coagulation Profile:  Recent Labs Lab 05/04/16 2339 05/05/16 0300  INR 5.20* 2.37   Cardiac Enzymes:  Recent Labs Lab 05/05/16 0830 05/05/16 1450  TROPONINI 0.17* 0.18*   BNP (last 3 results) No results for input(s): PROBNP in the last 8760 hours. HbA1C: No results for input(s): HGBA1C in the last 72 hours. CBG:  Recent Labs Lab 05/10/16 0751 05/10/16 1159 05/10/16 1718 05/10/16 2213 05/11/16 1225  GLUCAP 95 157* 120* 102* 129*   Lipid Profile: No results for input(s): CHOL, HDL, LDLCALC, TRIG, CHOLHDL, LDLDIRECT in the last 72 hours. Thyroid Function Tests: No results for input(s): TSH, T4TOTAL, FREET4, T3FREE, THYROIDAB in the last 72 hours. Anemia Panel: No results for input(s): VITAMINB12, FOLATE, FERRITIN, TIBC, IRON, RETICCTPCT in the last 72 hours. Sepsis Labs:  Recent Labs Lab 05/05/16 0226 05/05/16 0555 05/05/16 1730 05/06/16 0130  LATICACIDVEN 4.4* 4.3* 3.7* 3.9*    Recent Results (from the past 240 hour(s))  Urine culture     Status: Abnormal   Collection Time: 05/04/16 11:21 PM  Result Value Ref Range Status   Specimen Description URINE, RANDOM  Final   Special Requests NONE  Final   Culture 60,000 COLONIES/mL ESCHERICHIA COLI (A)  Final   Report Status 05/07/2016 FINAL  Final   Organism ID, Bacteria ESCHERICHIA COLI (A)  Final      Susceptibility   Escherichia coli - MIC*    AMPICILLIN >=32 RESISTANT Resistant     CEFAZOLIN <=4 SENSITIVE Sensitive     CEFTRIAXONE <=1 SENSITIVE Sensitive     CIPROFLOXACIN >=4 RESISTANT Resistant     GENTAMICIN <=1 SENSITIVE Sensitive     IMIPENEM <=0.25 SENSITIVE Sensitive     NITROFURANTOIN <=16 SENSITIVE Sensitive     TRIMETH/SULFA >=320 RESISTANT Resistant      AMPICILLIN/SULBACTAM >=32 RESISTANT Resistant     PIP/TAZO <=4 SENSITIVE Sensitive     Extended ESBL NEGATIVE Sensitive     * 60,000 COLONIES/mL ESCHERICHIA COLI  Blood Culture (routine x 2)     Status: None   Collection Time: 05/04/16 11:40 PM  Result Value Ref Range Status   Specimen Description BLOOD LEFT ARM  Final   Special Requests IN PEDIATRIC BOTTLE 3ML  Final   Culture NO GROWTH 5 DAYS  Final   Report Status 05/10/2016 FINAL  Final  Blood Culture (routine x 2)     Status: Abnormal   Collection Time: 05/04/16 11:49 PM  Result Value Ref Range Status   Specimen Description BLOOD RIGHT ARM  Final   Special Requests AEROBIC BOTTLE ONLY 5ML  Final   Culture  Setup Time   Final    GRAM POSITIVE COCCI IN CLUSTERS AEROBIC BOTTLE ONLY Organism ID to follow CRITICAL RESULT CALLED TO, READ BACK BY AND VERIFIED WITH: Leatha Gilding.D. 21:25 05/05/16 ([email or web address])    Culture (A)  Final    STAPHYLOCOCCUS SPECIES (COAGULASE NEGATIVE) THE SIGNIFICANCE OF ISOLATING THIS ORGANISM FROM A SINGLE SET OF BLOOD CULTURES WHEN MULTIPLE SETS ARE DRAWN IS UNCERTAIN. PLEASE NOTIFY THE MICROBIOLOGY DEPARTMENT WITHIN ONE WEEK IF SPECIATION AND SENSITIVITIES ARE REQUIRED.    Report Status 05/07/2016 FINAL  Final  Blood Culture ID Panel (Reflexed)     Status: Abnormal   Collection Time: 05/04/16 11:49 PM  Result Value Ref Range Status   Enterococcus species NOT DETECTED NOT DETECTED Final   Listeria monocytogenes NOT DETECTED NOT DETECTED Final   Staphylococcus species DETECTED (A) NOT DETECTED Final    Comment: CRITICAL RESULT CALLED TO, READ BACK BY AND VERIFIED WITH: Leatha Gilding.D. 21:25 05/05/16 ([email or web address])  Staphylococcus aureus NOT DETECTED NOT DETECTED Final   Methicillin resistance DETECTED (A) NOT DETECTED Final    Comment: CRITICAL RESULT CALLED TO, READ BACK BY AND VERIFIED WITH: Leatha Gilding.D. 21:25 05/05/16 ([email or web address])    Streptococcus species NOT DETECTED NOT DETECTED Final    Streptococcus agalactiae NOT DETECTED NOT DETECTED Final   Streptococcus pneumoniae NOT DETECTED NOT DETECTED Final   Streptococcus pyogenes NOT DETECTED NOT DETECTED Final   Acinetobacter baumannii NOT DETECTED NOT DETECTED Final   Enterobacteriaceae species NOT DETECTED NOT DETECTED Final   Enterobacter cloacae complex NOT DETECTED NOT DETECTED Final   Escherichia coli NOT DETECTED NOT DETECTED Final   Klebsiella oxytoca NOT DETECTED NOT DETECTED Final   Klebsiella pneumoniae NOT DETECTED NOT DETECTED Final   Proteus species NOT DETECTED NOT DETECTED Final   Serratia marcescens NOT DETECTED NOT DETECTED Final   Haemophilus influenzae NOT DETECTED NOT DETECTED Final   Neisseria meningitidis NOT DETECTED NOT DETECTED Final   Pseudomonas aeruginosa NOT DETECTED NOT DETECTED Final   Candida albicans NOT DETECTED NOT DETECTED Final   Candida glabrata NOT DETECTED NOT DETECTED Final   Candida krusei NOT DETECTED NOT DETECTED Final   Candida parapsilosis NOT DETECTED NOT DETECTED Final   Candida tropicalis NOT DETECTED NOT DETECTED Final  MRSA culture     Status: None   Collection Time: 05/05/16  7:39 AM  Result Value Ref Range Status   Specimen Description NASAL SWAB  Final   Special Requests NONE  Final   Culture NO MRSA DETECTED  Final   Report Status 05/07/2016 FINAL  Final  Culture, respiratory (NON-Expectorated)     Status: None   Collection Time: 05/05/16  4:30 PM  Result Value Ref Range Status   Specimen Description TRACHEAL ASPIRATE  Final   Special Requests NONE  Final   Gram Stain   Final    MODERATE WBC PRESENT,BOTH PMN AND MONONUCLEAR NO ORGANISMS SEEN    Culture Consistent with normal respiratory flora.  Final   Report Status 05/08/2016 FINAL  Final  C difficile quick scan w PCR reflex     Status: None   Collection Time: 05/05/16  4:50 PM  Result Value Ref Range Status   C Diff antigen NEGATIVE NEGATIVE Final   C Diff toxin NEGATIVE NEGATIVE Final   C Diff  interpretation No C. difficile detected.  Final         Radiology Studies: No results found.      Scheduled Meds: . sodium chloride  10 mL/hr Intravenous Once  . cefTRIAXone (ROCEPHIN)  IV  1 g Intravenous Q24H  . famotidine  20 mg Oral BID  . feeding supplement (ENSURE ENLIVE)  237 mL Oral BID BM  . insulin aspart  0-15 Units Subcutaneous TID WC  . insulin aspart  0-5 Units Subcutaneous QHS  . sodium chloride  1,000 mL Intravenous Once   Continuous Infusions: . sodium chloride 10 mL/hr at 05/09/16 1900     LOS: 6 days    Time spent: 35 minutes    Loretha Stapler, MD [facility] Pager [phone]  If 7PM-7AM, please contact night-coverage [email or web address] Password [email or web address] 05/11/2016, 4:02 PM

## 2016-05-11 NOTE — Evaluation (Signed)
Physical Therapy Evaluation Patient Details Name: [name] MRN: [id number] DOB: 1961/02/27 Today's Date: 05/11/2016   History of Present Illness  55 year old female with hx MS, CKD III, DM, GERD, HTN, nephrolithiasis, sacral decub, and neuromuscular disorder of bladder. She has a chronic indwelling nephrostomy tube and bilateral hydronephrosis due to obstructing urinary calculi. Recent admission for septic shock from urinary source at which time she was evaluated by urology, cystoscopy did not reveal any evidence of fistula. Plan was for her to undergo nephrolithotomy on 11/30 (Dr Diona Fanti).   Clinical Impression  Pt admitted with above diagnosis. Pt was total care at [facility] for ADLs and mobility.  They use hoyer lift to get pt to power wheelchair.  Pt is not appropriate for skilled PT at this time.  Will sign off.        Follow Up Recommendations SNF;Supervision/Assistance - 24 hour    Equipment Recommendations  None recommended by PT    Recommendations for Other Services       Precautions / Restrictions Precautions Precautions: Fall Restrictions Weight Bearing Restrictions: No      Mobility  Bed Mobility Overal bed mobility: Needs Assistance;+2 for physical assistance Bed Mobility: Supine to Sit     Supine to sit: Total assist;+2 for physical assistance;HOB elevated     General bed mobility comments: Pt could not assist with anything but reaching for rail.  Could not move LEs or initiate any upper trunk movement.   Transfers                    Ambulation/Gait                Stairs            Wheelchair Mobility    Modified Rankin (Stroke Patients Only)       Balance Overall balance assessment: Needs assistance Sitting-balance support: Bilateral upper extremity supported;Feet supported Sitting balance-Leahy Scale: Zero Sitting balance - Comments: Pt unable to sit EOB with poor balance/ poor postural stability.  Leans to left and  posterior.  Postural control: Posterior lean;Left lateral lean                                   Pertinent Vitals/Pain Pain Assessment: Faces Faces Pain Scale: Hurts little more Pain Location: generalized Pain Descriptors / Indicators: Aching;Sore Pain Intervention(s): Limited activity within patient's tolerance;Monitored during session;Repositioned  VSS  Home Living Family/patient expects to be discharged to:: Skilled nursing facility                 Additional Comments: hoyer lift to [place] chair at SNF    Prior Function Level of Independence: Needs assistance   Gait / Transfers Assistance Needed: Per Hiral,PT at  [facility], pt was being lifted via hoyer lift to her power wheelchair by staff.  PT was not following pt as nursing following restorative program.   ADL's / Homemaking Assistance Needed: Total assist for all ADLs except can use left UE for some feeding.         Hand Dominance   Dominant Hand: Right    Extremity/Trunk Assessment   Upper Extremity Assessment: Defer to OT evaluation           Lower Extremity Assessment: RLE deficits/detail;LLE deficits/detail RLE Deficits / Details: 0/5 grossly LLE Deficits / Details: 0/5 grossly  Cervical / Trunk Assessment: Kyphotic  Communication   Communication:  No difficulties  Cognition Arousal/Alertness: Awake/alert Behavior During Therapy: Flat affect Overall Cognitive Status: History of cognitive impairments - at baseline       Memory: Decreased short-term memory              General Comments General comments (skin integrity, edema, etc.): Positioned pt with pillows to prevent ulcers.  Provided pressure relief.      Exercises General Exercises - Lower Extremity Ankle Circles/Pumps: PROM;Both;5 reps;Supine Heel Slides: PROM;Both;5 reps;Supine   Assessment/Plan    PT Assessment Patent does not need any further PT services  PT Problem List            PT Treatment  Interventions      PT Goals (Current goals can be found in the Care Plan section)  Acute Rehab PT Goals Patient Stated Goal: Unable to state PT Goal Formulation: All assessment and education complete, DC therapy    Frequency     Barriers to discharge        Co-evaluation               End of Session Equipment Utilized During Treatment: Gait belt Activity Tolerance: Patient limited by fatigue;Patient limited by pain Patient left: in bed;with call bell/phone within reach;with bed alarm set;with SCD's reapplied Nurse Communication: Mobility status;Need for lift equipment         Time: 1145-1158 PT Time Calculation (min) (ACUTE ONLY): 13 min   Charges:   PT Evaluation $PT Eval Low Complexity: 1 Procedure     PT G Codes:        Ayani Ospina F Conrado Nance June 09, 2016, 1:36 PM Attica Fionnuala Hemmerich,PT Acute Rehabilitation [phone] (pager)

## 2016-05-11 NOTE — Consult Note (Signed)
Urology Consult  Referring physician: Troy Sine Reason for referral: UTI with stone  Chief Complaint: UTI with stone  History of Present Illness: Patient well known to Urology. Scheduled for stone surgery tomorrow; admitted for urosepsis; complicated Urologic history well kknow to Dr Diona Fanti; has medical co-morbidities and CRI;   CT scan: bilateral hydro and left renal stones and distal large ureteral stone and perc tube; fluid tracks around bladder and uterus;   Has had recent PICC line; E coli in urine; WBC 15.5; serum Cr .71  No right or left flank pain now  Modifying factors: There are no other modifying factors  Associated signs and symptoms: There are no other associated signs and symptoms Aggravating and relieving factors: There are no other aggravating or relieving factors Severity: Moderate Duration: Persistent    Past Medical History:  Diagnosis Date  . Acute renal failure superimposed on stage 3 chronic kidney disease ([place]) 07/21/2015  . Anemia    chronic   . Diabetes mellitus without complication ([place])   . Diabetic neuropathy ([place])   . DVT (deep venous thrombosis) ([place])   . DVT (deep venous thrombosis) ([place])   . GERD (gastroesophageal reflux disease)   . Hx of sepsis   . Hypertension   . Left nephrolithiasis   . Leukocytosis   . Lymph edema    chronic   . Malnutrition of moderate degree 07/21/2015  . Multiple sclerosis diagnosed 2002-not on Therapy any longer 06/26/2012  . Muscle weakness (generalized)   . Neuromuscular disorder ([place])   . Neuromuscular dysfunction of bladder   . Polyneuropathy ([place])   . Presence of indwelling urinary catheter   . Protein calorie malnutrition ([place])   . Stage 4 decubitus ulcer ([place]) 07/05/2015  . Stroke ([place])   . UTI (lower urinary tract infection)    Past Surgical History:  Procedure Laterality Date  . ESOPHAGOGASTRODUODENOSCOPY Left 01/02/2015   Procedure: ESOPHAGOGASTRODUODENOSCOPY (EGD);  Surgeon: Arta Silence, MD;   Location: [facility];  Service: Endoscopy;  Laterality: Left;  . HERNIA MESH REMOVAL    . IR GENERIC HISTORICAL  02/23/2016   IR NEPHROSTOMY EXCHANGE LEFT 02/23/2016 Sandi Mariscal, MD [facility]  . IR GENERIC HISTORICAL  04/19/2016   IR NEPHROSTOMY EXCHANGE LEFT 04/19/2016 Arne Cleveland, MD [facility]  . NEPHROSTOMY TUBE PLACEMENT ([place] HX)    . TRANSURETHRAL RESECTION OF BLADDER TUMOR N/A 03/16/2016   Procedure: CYSTOSCOPY BLADDER BIOPSY;  Surgeon: Franchot Gallo, MD;  Location: [facility];  Service: Urology;  Laterality: N/A;  . TUBAL LIGATION    . tubes tided      Medications: I have reviewed the patient's current medications. Allergies: No Known Allergies  Family History  Problem Relation Age of Onset  . Diabetes Mother   . Alzheimer's disease Mother   . Diabetes Father   . Diabetes Sister   . Scoliosis Sister    Social History:  reports that she quit smoking about 17 months ago. Her smoking use included Cigarettes. She has a 20.00 pack-year smoking history. She has never used smokeless tobacco. She reports that she drinks about 3.0 oz of alcohol per week . She reports that she does not use drugs.  ROS: All systems are reviewed and negative except as noted. Rest negative  Physical Exam:  Vital signs in last 24 hours: Temp:  [98.1 F (36.7 C)-98.6 F (37 C)] 98.1 F (36.7 C) (11/29 0602) Pulse Rate:  [81-91] 81 (11/29 0602) Resp:  [18-19] 18 (11/29 0602) BP: (148-159)/(64-77) 159/64 (11/29 0602)  SpO2:  [100 %] 100 % (11/29 0602) Weight:  [71.8 kg (158 lb 4.6 oz)-74.7 kg (164 lb 10.9 oz)] 71.8 kg (158 lb 4.6 oz) (11/29 0602)  Cardiovascular: Skin warm; not flushed Respiratory: Breaths quiet; no shortness of breath Abdomen: No masses Neurological: Normal sensation to touch Musculoskeletal: Normal motor function arms and legs Lymphatics: No inguinal adenopathy Skin: No rashes Genitourinary:No CVA tenderness  Laboratory Data:  Results for orders placed or performed  during the hospital encounter of 05/04/16 (from the past 72 hour(s))  Glucose, capillary     Status: Abnormal   Collection Time: 05/08/16  3:33 PM  Result Value Ref Range   Glucose-Capillary 145 (H) 65 - 99 mg/dL  Magnesium     Status: None   Collection Time: 05/08/16  6:00 PM  Result Value Ref Range   Magnesium 2.0 1.7 - 2.4 mg/dL  Phosphorus     Status: Abnormal   Collection Time: 05/08/16  6:00 PM  Result Value Ref Range   Phosphorus 2.2 (L) 2.5 - 4.6 mg/dL  Glucose, capillary     Status: Abnormal   Collection Time: 05/08/16  8:12 PM  Result Value Ref Range   Glucose-Capillary 112 (H) 65 - 99 mg/dL  Glucose, capillary     Status: None   Collection Time: 05/08/16 11:38 PM  Result Value Ref Range   Glucose-Capillary 95 65 - 99 mg/dL  Glucose, capillary     Status: None   Collection Time: 05/09/16  4:02 AM  Result Value Ref Range   Glucose-Capillary 96 65 - 99 mg/dL  CBC     Status: Abnormal   Collection Time: 05/09/16  5:00 AM  Result Value Ref Range   WBC 15.5 (H) 4.0 - 10.5 K/uL   RBC 3.07 (L) 3.87 - 5.11 MIL/uL   Hemoglobin 8.8 (L) 12.0 - 15.0 g/dL   HCT 26.3 (L) 36.0 - 46.0 %   MCV 85.7 78.0 - 100.0 fL   MCH 28.7 26.0 - 34.0 pg   MCHC 33.5 30.0 - 36.0 g/dL   RDW 17.1 (H) 11.5 - 15.5 %   Platelets 169 150 - 400 K/uL  Basic metabolic panel     Status: Abnormal   Collection Time: 05/09/16  5:00 AM  Result Value Ref Range   Sodium 145 135 - 145 mmol/L   Potassium 3.7 3.5 - 5.1 mmol/L    Comment: DELTA CHECK NOTED   Chloride 122 (H) 101 - 111 mmol/L   CO2 19 (L) 22 - 32 mmol/L   Glucose, Bld 105 (H) 65 - 99 mg/dL   BUN 22 (H) 6 - 20 mg/dL   Creatinine, Ser 0.52 0.44 - 1.00 mg/dL   Calcium 8.9 8.9 - 10.3 mg/dL   GFR calc non Af Amer >60 >60 mL/min   GFR calc Af Amer >60 >60 mL/min    Comment: (NOTE) The eGFR has been calculated using the CKD EPI equation. This calculation has not been validated in all clinical situations. eGFR's persistently <60 mL/min signify  possible Chronic Kidney Disease.    Anion gap 4 (L) 5 - 15  Glucose, capillary     Status: None   Collection Time: 05/09/16  8:38 AM  Result Value Ref Range   Glucose-Capillary 70 65 - 99 mg/dL   Comment 1 Notify RN    Comment 2 Document in Chart   Glucose, capillary     Status: Abnormal   Collection Time: 05/09/16 12:47 PM  Result Value Ref Range   Glucose-Capillary  136 (H) 65 - 99 mg/dL   Comment 1 Notify RN    Comment 2 Document in Chart   Glucose, capillary     Status: Abnormal   Collection Time: 05/09/16  4:36 PM  Result Value Ref Range   Glucose-Capillary 169 (H) 65 - 99 mg/dL   Comment 1 Notify RN   Glucose, capillary     Status: Abnormal   Collection Time: 05/09/16 10:33 PM  Result Value Ref Range   Glucose-Capillary 137 (H) 65 - 99 mg/dL   Comment 1 Notify RN    Comment 2 Document in Chart   Glucose, capillary     Status: None   Collection Time: 05/10/16  7:51 AM  Result Value Ref Range   Glucose-Capillary 95 65 - 99 mg/dL   Comment 1 Notify RN   Glucose, capillary     Status: Abnormal   Collection Time: 05/10/16 11:59 AM  Result Value Ref Range   Glucose-Capillary 157 (H) 65 - 99 mg/dL   Comment 1 Notify RN   Glucose, capillary     Status: Abnormal   Collection Time: 05/10/16  5:18 PM  Result Value Ref Range   Glucose-Capillary 120 (H) 65 - 99 mg/dL   Comment 1 Notify RN   Glucose, capillary     Status: Abnormal   Collection Time: 05/10/16 10:13 PM  Result Value Ref Range   Glucose-Capillary 102 (H) 65 - 99 mg/dL   Recent Results (from the past 240 hour(s))  Urine culture     Status: Abnormal   Collection Time: 05/04/16 11:21 PM  Result Value Ref Range Status   Specimen Description URINE, RANDOM  Final   Special Requests NONE  Final   Culture 60,000 COLONIES/mL ESCHERICHIA COLI (A)  Final   Report Status 05/07/2016 FINAL  Final   Organism ID, Bacteria ESCHERICHIA COLI (A)  Final      Susceptibility   Escherichia coli - MIC*    AMPICILLIN >=32  RESISTANT Resistant     CEFAZOLIN <=4 SENSITIVE Sensitive     CEFTRIAXONE <=1 SENSITIVE Sensitive     CIPROFLOXACIN >=4 RESISTANT Resistant     GENTAMICIN <=1 SENSITIVE Sensitive     IMIPENEM <=0.25 SENSITIVE Sensitive     NITROFURANTOIN <=16 SENSITIVE Sensitive     TRIMETH/SULFA >=320 RESISTANT Resistant     AMPICILLIN/SULBACTAM >=32 RESISTANT Resistant     PIP/TAZO <=4 SENSITIVE Sensitive     Extended ESBL NEGATIVE Sensitive     * 60,000 COLONIES/mL ESCHERICHIA COLI  Blood Culture (routine x 2)     Status: None   Collection Time: 05/04/16 11:40 PM  Result Value Ref Range Status   Specimen Description BLOOD LEFT ARM  Final   Special Requests IN PEDIATRIC BOTTLE 3ML  Final   Culture NO GROWTH 5 DAYS  Final   Report Status 05/10/2016 FINAL  Final  Blood Culture (routine x 2)     Status: Abnormal   Collection Time: 05/04/16 11:49 PM  Result Value Ref Range Status   Specimen Description BLOOD RIGHT ARM  Final   Special Requests AEROBIC BOTTLE ONLY 5ML  Final   Culture  Setup Time   Final    GRAM POSITIVE COCCI IN CLUSTERS AEROBIC BOTTLE ONLY Organism ID to follow CRITICAL RESULT CALLED TO, READ BACK BY AND VERIFIED WITH: Leatha Gilding.D. 21:25 05/05/16 ([email or web address])    Culture (A)  Final    STAPHYLOCOCCUS SPECIES (COAGULASE NEGATIVE) THE SIGNIFICANCE OF ISOLATING THIS ORGANISM FROM A SINGLE SET OF BLOOD  CULTURES WHEN MULTIPLE SETS ARE DRAWN IS UNCERTAIN. PLEASE NOTIFY THE MICROBIOLOGY DEPARTMENT WITHIN ONE WEEK IF SPECIATION AND SENSITIVITIES ARE REQUIRED.    Report Status 05/07/2016 FINAL  Final  Blood Culture ID Panel (Reflexed)     Status: Abnormal   Collection Time: 05/04/16 11:49 PM  Result Value Ref Range Status   Enterococcus species NOT DETECTED NOT DETECTED Final   Listeria monocytogenes NOT DETECTED NOT DETECTED Final   Staphylococcus species DETECTED (A) NOT DETECTED Final    Comment: CRITICAL RESULT CALLED TO, READ BACK BY AND VERIFIED WITH: Leatha Gilding.D. 21:25  05/05/16 ([email or web address])    Staphylococcus aureus NOT DETECTED NOT DETECTED Final   Methicillin resistance DETECTED (A) NOT DETECTED Final    Comment: CRITICAL RESULT CALLED TO, READ BACK BY AND VERIFIED WITH: Leatha Gilding.D. 21:25 05/05/16 ([email or web address])    Streptococcus species NOT DETECTED NOT DETECTED Final   Streptococcus agalactiae NOT DETECTED NOT DETECTED Final   Streptococcus pneumoniae NOT DETECTED NOT DETECTED Final   Streptococcus pyogenes NOT DETECTED NOT DETECTED Final   Acinetobacter baumannii NOT DETECTED NOT DETECTED Final   Enterobacteriaceae species NOT DETECTED NOT DETECTED Final   Enterobacter cloacae complex NOT DETECTED NOT DETECTED Final   Escherichia coli NOT DETECTED NOT DETECTED Final   Klebsiella oxytoca NOT DETECTED NOT DETECTED Final   Klebsiella pneumoniae NOT DETECTED NOT DETECTED Final   Proteus species NOT DETECTED NOT DETECTED Final   Serratia marcescens NOT DETECTED NOT DETECTED Final   Haemophilus influenzae NOT DETECTED NOT DETECTED Final   Neisseria meningitidis NOT DETECTED NOT DETECTED Final   Pseudomonas aeruginosa NOT DETECTED NOT DETECTED Final   Candida albicans NOT DETECTED NOT DETECTED Final   Candida glabrata NOT DETECTED NOT DETECTED Final   Candida krusei NOT DETECTED NOT DETECTED Final   Candida parapsilosis NOT DETECTED NOT DETECTED Final   Candida tropicalis NOT DETECTED NOT DETECTED Final  MRSA culture     Status: None   Collection Time: 05/05/16  7:39 AM  Result Value Ref Range Status   Specimen Description NASAL SWAB  Final   Special Requests NONE  Final   Culture NO MRSA DETECTED  Final   Report Status 05/07/2016 FINAL  Final  Culture, respiratory (NON-Expectorated)     Status: None   Collection Time: 05/05/16  4:30 PM  Result Value Ref Range Status   Specimen Description TRACHEAL ASPIRATE  Final   Special Requests NONE  Final   Gram Stain   Final    MODERATE WBC PRESENT,BOTH PMN AND MONONUCLEAR NO ORGANISMS SEEN    Culture  Consistent with normal respiratory flora.  Final   Report Status 05/08/2016 FINAL  Final  C difficile quick scan w PCR reflex     Status: None   Collection Time: 05/05/16  4:50 PM  Result Value Ref Range Status   C Diff antigen NEGATIVE NEGATIVE Final   C Diff toxin NEGATIVE NEGATIVE Final   C Diff interpretation No C. difficile detected.  Final   Creatinine:  Recent Labs  05/05/16 1555 05/05/16 1730 05/06/16 0500 05/07/16 0455 05/07/16 1428 05/08/16 0405 05/09/16 0500  CREATININE 2.17* 1.89* 1.15* 0.83 0.86 0.71 0.52    Xrays: See report/chart reviewed  Impression/Assessment:  Complicated UTI- treat per c/s results; surgery delayed due to UTI; has moderate right hydro as well but no right flank pain and no stone and normal Cr  Plan:  Continue to treat UTI; Dr Vernie Shanks will see pt in am and arrange long-term plan  Larine Fielding  A 05/11/2016, 12:04 PM    2

## 2016-05-11 NOTE — Clinical Social Work Note (Signed)
Clinical Social Work Assessment  Patient Details  Name: [name] MRN: [id number] Date of Birth: July 02, 1960  Date of referral:  05/11/16               Reason for consult:  Discharge Planning                Permission sought to share information with:  Facility Sport and exercise psychologist, Family Supports Permission granted to share information::  Yes, Verbal Permission Granted  Name::     [name]  Agency::  [name]  Relationship::  Cousin  Contact Information:  [phone]  Housing/Transportation Living arrangements for the past 2 months:  [place] of Information:  Medical Team, Other (Comment Required) (Cousin/first point of contact) Patient Interpreter Needed:  None Criminal Activity/Legal Involvement Pertinent to Current Situation/Hospitalization:  No - Comment as needed Significant Relationships:  Other Family Members, Friend, Siblings Lives with:  Facility Resident Do you feel safe going back to the place where you live?  Yes Need for family participation in patient care:  Yes (Comment)  Care giving concerns:  Patient is a [facility] at [facility].   Social Worker assessment / plan:  Patient oriented only to self. CSW contacted patient's cousin/first point of contact, [name]. CSW introduced role and explained that discharge planning would be discussed. Patient's cousin confirmed that patient came from [facility] and the plan is for her to return once discharged. She will need PTAR. No further concerns. CSW encouraged patient's cousin to contact CSW as needed. CSW will continue to follow patient and her family and facilitate discharge back to [facility] once medically stable for discharge.  Employment status:  Disabled (Comment on whether or not currently receiving Disability) Insurance information:  Managed Medicare PT Recommendations:  Not assessed at this time Information / Referral to community resources:  [place]  Patient/Family's Response to care:  Patient not fully oriented. Patient's cousin agreeable to return to SNF. Patient's family and friends supportive and involved in patient's care. Patient's cousin appreciated social work intervention.  Patient/Family's Understanding of and Emotional Response to Diagnosis, Current Treatment, and Prognosis:  Patient not fully oriented. Patient's cousin understands and is agreeable to discharge plan. Patient's cousin appears happy with hospital care.  Emotional Assessment Appearance:  Appears stated age Attitude/Demeanor/Rapport:  Unable to Assess Affect (typically observed):  Unable to Assess Orientation:  Oriented to Self Alcohol / Substance use:  Never Used Psych involvement (Current and /or in the community):  No (Comment)  Discharge Needs  Concerns to be addressed:  Care Coordination Readmission within the last 30 days:  No Current discharge risk:  Cognitively Impaired, Dependent with Mobility Barriers to Discharge:  Continued Medical Work up   Candie Chroman, LCSW 05/11/2016, 12:44 PM

## 2016-05-11 NOTE — NC FL2 (Signed)
[place] LEVEL OF CARE SCREENING TOOL     IDENTIFICATION  Patient Name: [name] Birthdate: 1961/02/07 Sex: female Admission Date (Current Location): 05/04/2016  [facility] and [place] Number:  Herbalist and Address:  [facility] [place] [address] [place], [place] [address]      Provider Number: [id number]  Attending Physician Name and Address:  Eber Jones, MD  Relative Name and Phone Number:       Current Level of Care: Hospital Recommended Level of Care: [place] Prior Approval Number:    Date Approved/Denied:   PASRR Number: [id number]  Discharge Plan: SNF    Current Diagnoses: Patient Active Problem List   Diagnosis Date Noted  . Acute respiratory failure with hypoxia ([place])   . Pelvic abscess in female 03/17/2016  . Pressure injury of skin 03/12/2016  . Septic shock ([place]) 03/08/2016  . Encounter for central line placement   . Arterial hypotension   . Neuropathic pain 01/11/2016  . Attention to nephrostomy ([place]) 01/11/2016  . Acute renal failure ([place])   . Partial small bowel obstruction 12/24/2015  . Bacteremia due to coagulase-negative Staphylococcus 12/22/2015  . Left nephrolithiasis 12/22/2015  . Gram-negative bacteremia 12/22/2015  . Palliative care encounter   . SIRS (systemic inflammatory response syndrome) ([place]) 12/18/2015  . Acute pyelonephritis   . Pressure ulcer   . Controlled diabetes mellitus type 2 with complications ([place])   . Neurogenic bladder 11/11/2015  . Physical deconditioning 11/11/2015  . Chronic indwelling Foley catheter 09/30/2015  . Hypokalemia 09/04/2015  . Dyslipidemia associated with type 2 diabetes mellitus ([place]) 08/28/2015  . GERD without esophagitis 08/28/2015  . Acute renal failure superimposed on stage 3 chronic kidney disease ([place]) 07/21/2015  . Leukocytosis 07/21/2015  . Anemia of chronic disease 07/21/2015  . Hyperkalemia 07/21/2015  .  Urinary tract infection without hematuria 07/21/2015  . Malnutrition of moderate degree 07/21/2015  . DVT, lower extremity ([place]) 12/28/2014  . Multiple sclerosis diagnosed 2002-not on Therapy any longer 06/26/2012    Orientation RESPIRATION BLADDER Height & Weight     Self  O2 (Nasal Canula 2 L) Incontinent, Indwelling catheter (Nephrostomy) Weight: 158 lb 4.6 oz (71.8 kg) Height:  5' 5.5" (166.4 cm)  BEHAVIORAL SYMPTOMS/MOOD NEUROLOGICAL BOWEL NUTRITION STATUS   (None)  (None) Incontinent Diet (Carb modified)  AMBULATORY STATUS COMMUNICATION OF NEEDS Skin   Extensive Assist Verbally Other (Comment) (Cracking. Pressure injury Stage 2 mid sacrum, Stage 1 left buttocks.)                       Personal Care Assistance Level of Assistance              Functional Limitations Info  Sight, Hearing, Speech Sight Info: Adequate Hearing Info: Adequate Speech Info: Adequate    SPECIAL CARE FACTORS FREQUENCY  PT (By licensed PT), Blood pressure, Diabetic urine testing, OT (By licensed OT)     PT Frequency: 5 x week OT Frequency: 5 x week            Contractures Contractures Info: Not present    Additional Factors Info  Code Status, Allergies Code Status Info: Full Allergies Info: NKDA           Current Medications (05/11/2016):  This is the current hospital active medication list Current Facility-Administered Medications  Medication Dose Route Frequency Provider Last Rate Last Dose  . 0.9 %  sodium chloride infusion  10  mL/hr Intravenous Once Ezequiel Essex, MD      . 0.9 %  sodium chloride infusion  250 mL Intravenous PRN Corey Harold, NP      . 0.9 %  sodium chloride infusion   Intravenous Continuous Collene Gobble, MD 10 mL/hr at 05/09/16 1900    . cefTRIAXone (ROCEPHIN) 1 g in dextrose 5 % 50 mL IVPB  1 g Intravenous Q24H Jose Shirl Harris, MD   1 g at 05/11/16 1452  . famotidine (PEPCID) tablet 20 mg  20 mg Oral BID Theodis Blaze, MD   20 mg at 05/11/16  0945  . feeding supplement (ENSURE ENLIVE) (ENSURE ENLIVE) liquid 237 mL  237 mL Oral BID BM Theodis Blaze, MD   237 mL at 05/11/16 1404  . fentaNYL (SUBLIMAZE) injection 12.5-50 mcg  12.5-50 mcg Intravenous Q1H PRN Marijean Heath, NP      . insulin aspart (novoLOG) injection 0-15 Units  0-15 Units Subcutaneous TID WC Collene Gobble, MD   3 Units at 05/10/16 1402  . insulin aspart (novoLOG) injection 0-5 Units  0-5 Units Subcutaneous QHS Collene Gobble, MD      . sodium chloride 0.9 % bolus 1,000 mL  1,000 mL Intravenous Once Ezequiel Essex, MD         Discharge Medications: Please see discharge summary for a list of discharge medications.  Relevant Imaging Results:  Relevant Lab Results:   Additional Information SS#: [id number]  Candie Chroman, LCSW

## 2016-05-11 NOTE — Progress Notes (Signed)
Physical Therapy Discharge Patient Details Name: [name] MRN: [id number] DOB: [birth year] Today's Date: 05/11/2016 Time: [id number] PT Time Calculation (min) (ACUTE ONLY): 13 min  Patient discharged from PT services secondary to pt has no PT needs as she is at baseline. .  Please see latest therapy progress note for current level of functioning and progress toward goals.    Progress and discharge plan discussed with patient and/or caregiver: Patient/Caregiver agrees with plan  GP     [facility] Tyquez Hollibaugh,PT Acute Rehabilitation [phone] (pager)   05/11/2016, 1:38 PM

## 2016-05-12 ENCOUNTER — Ambulatory Visit (HOSPITAL_COMMUNITY): Admission: RE | Admit: 2016-05-12 | Payer: Medicare Other | Source: Ambulatory Visit | Admitting: Urology

## 2016-05-12 HISTORY — DX: Personal history of other infectious and parasitic diseases: Z86.19

## 2016-05-12 LAB — GLUCOSE, CAPILLARY
GLUCOSE-CAPILLARY: 103 mg/dL — AB (ref 65–99)
GLUCOSE-CAPILLARY: 149 mg/dL — AB (ref 65–99)
GLUCOSE-CAPILLARY: 136 mg/dL — AB (ref 65–99)
Glucose-Capillary: 164 mg/dL — ABNORMAL HIGH (ref 65–99)

## 2016-05-12 SURGERY — NEPHROLITHOTOMY PERCUTANEOUS
Anesthesia: General | Laterality: Left

## 2016-05-12 NOTE — Progress Notes (Signed)
PROGRESS NOTE    [name]  [id number] DOB: 1961/01/14 DOA: 05/04/2016 PCP: Blanchie Serve, MD    Brief Narrative:  55 year old female with hx MS, CKD III, DM, GERD, HTN, nephrolithiasis, sacral decub, and neuromuscular disorder of bladder. She has a chronic indwelling nephrostomy tube and bilateral hydronephrosis due to obstructing urinary calculi. Recent admission for septic shock from urinary source at which time she was evaluated by urology, cystoscopy did not reveal any evidence of fistula. Plan was for her to undergo nephrolithotomy on 11/30 (Dr Diona Fanti). She was discharged to [facility] with 2 weeks of antibiotics via PICC line, which was discontinued on 11/21.  She was brought in by EMS on 11/22 with altered mental status in the form of lethargy for one day and no urine output. Frank pus was noted both in the Foley and nephrostomy tube, her Foley was coated wfeces, she was hypotensive and sepsis protocol was initiated. She was intubated for airway protection, given Zosyn and vancomycin. Initial hemoglobin was 4 but on repeat this was noted to be 11. PCCM admitted pt and pt extubated on 11/26, TRH assumed care 11/28   Assessment & Plan:   Active Problems:   Septic shock (HCC)   Acute respiratory failure with hypoxia (HCC)   Acute respiratory failure Intubated for airway protection, extubated 11/26 - Pulmonary hygiene - currently pt maintaining oxygen saturations at target range  - no respiratory distress   Septic shock, sepsis secondary to pelvic infection, cystitis, E. Coli UTI - Coag neg staph 1 of 2 blood cx likely contaminant  - Ceftriaxone as above as sensitivity report indicate resistance to multiple PO ABX - Defer rx for coag neg staph for now - will repeat CBC in am (patient refused blood draws yesterday) - will await urology plans for intervention (inpt vs outpt) and possibly prophylactic abx?   AKI, metabolic acidosis  - Obstructive hydronephrosis/  pyonephrosis s/p left nephrostomy tube  - per PCCM, plan to consult urology this admission, suspect that this illness will delay plans for lithotomy  - Cr is now WNL  - repeat BMP in AM - Urology consulted   UGIB, anemia of chronic disease  - Stool occult blood positive - no further evidence of bleeding  - Holding xarelto for now - consider restarting if H/H stable  Hypokalemia - supplemented - will need repeat BMP in am   Chronic DVT, last study 02/2015 - plan to restart xarelto prior to d/c home - follow CBC to insure Hgb stable  At risk for relative adrenal insufficiency  - Stress dose steroids >stop 11/27  Multiple sclerosis  - stable for now  DVT prophylaxis: SCD's, resume Xarelto once ensured Hg stable  Code Status: Full  Family Communication: Patient at bedside  Disposition Plan: pending clearance by Urology    Consultants:   PCCM  Urology  Procedures:   Intubation  Antimicrobials:  ? 11/22 zosyn>>11/24 ? 11/22 vanc >>11/24  ceftriaxone 11/24>>>    Subjective: Patient in bed and has a visitor.  Patient visitor states that she thinks patient seems better today.  Discussed treatment plan.  Awaiting urology plan.  Objective: Vitals:   05/11/16 0602 05/11/16 1445 05/11/16 2150 05/12/16 0453  BP: (!) 159/64 (!) 149/74 124/66 128/60  Pulse: 81 84 81 80  Resp: 18 18 16 16   Temp: 98.1 F (36.7 C) 98.3 F (36.8 C) 98.9 F (37.2 C) 98.9 F (37.2 C)  TempSrc: Oral Oral Oral Oral  SpO2: 100% 100%  100% 100%  Weight: 71.8 kg (158 lb 4.6 oz)   79.4 kg (175 lb)  Height:        Intake/Output Summary (Last 24 hours) at 05/12/16 1537 Last data filed at 05/12/16 0836  Gross per 24 hour  Intake              840 ml  Output              600 ml  Net              240 ml   Filed Weights   05/10/16 1443 05/11/16 0602 05/12/16 0453  Weight: 74.7 kg (164 lb 10.9 oz) 71.8 kg (158 lb 4.6 oz) 79.4 kg (175 lb)    Examination:  General exam: Appears  calm and comfortable  Respiratory system: Clear to auscultation. Respiratory effort normal. Cardiovascular system: S1 & S2 heard, RRR. No JVD, murmurs, rubs, gallops or clicks. No pedal edema. Gastrointestinal system: Abdomen is nondistended, soft and nontender. No organomegaly or masses felt. Normal bowel sounds heard. Nephrostomy tube draining clear urine Central nervous system: Alert and oriented to self- able to answer questions, somewhat confused as to what has happened during hospitalization. No focal neurological deficits. Extremities: Symmetric 5 x 5 power. Skin: No rashes, lesions or ulcers, bilateral skin thickening of lower extremities bilaterally especially ventral aspect of feet      Data Reviewed: I have personally reviewed following labs and imaging studies  CBC:  Recent Labs Lab 05/06/16 0500 05/07/16 0455 05/08/16 0405 05/09/16 0500  WBC 19.1* 22.4* 18.1* 15.5*  HGB 9.3* 9.0* 8.7* 8.8*  HCT 26.6* 25.8* 24.5* 26.3*  MCV 83.6 83.0 83.3 85.7  PLT 271 205 176 [id number]   Basic Metabolic Panel:  Recent Labs Lab 05/06/16 0500 05/07/16 0455 05/07/16 1428 05/08/16 0405 05/08/16 1800 05/09/16 0500  NA 143 142 141 144  --  145  K 2.6* 2.3* 3.3* 3.0*  --  3.7  CL 117* 117* 119* 121*  --  122*  CO2 15* 17* 14* 18*  --  19*  GLUCOSE 154* 141* 180* 226*  --  105*  BUN 36* 25* 25* 29*  --  22*  CREATININE 1.15* 0.83 0.86 0.71  --  0.52  CALCIUM 8.5* 8.3* 8.1* 8.4*  --  8.9  MG 1.4* 1.5* 2.2  --  2.0  --   PHOS 1.9* 2.2*  --   --  2.2*  --    GFR: Estimated Creatinine Clearance: 81.7 mL/min (by C-G formula based on SCr of 0.52 mg/dL). Liver Function Tests: No results for input(s): AST, ALT, ALKPHOS, BILITOT, PROT, ALBUMIN in the last 168 hours. No results for input(s): LIPASE, AMYLASE in the last 168 hours. No results for input(s): AMMONIA in the last 168 hours. Coagulation Profile: No results for input(s): INR, PROTIME in the last 168 hours. Cardiac Enzymes: No  results for input(s): CKTOTAL, CKMB, CKMBINDEX, TROPONINI in the last 168 hours. BNP (last 3 results) No results for input(s): PROBNP in the last 8760 hours. HbA1C: No results for input(s): HGBA1C in the last 72 hours. CBG:  Recent Labs Lab 05/11/16 1225 05/11/16 1729 05/11/16 2145 05/12/16 0746 05/12/16 1140  GLUCAP 129* 153* 156* 103* 149*   Lipid Profile: No results for input(s): CHOL, HDL, LDLCALC, TRIG, CHOLHDL, LDLDIRECT in the last 72 hours. Thyroid Function Tests: No results for input(s): TSH, T4TOTAL, FREET4, T3FREE, THYROIDAB in the last 72 hours. Anemia Panel: No results for input(s): VITAMINB12, FOLATE, FERRITIN, TIBC, IRON,  RETICCTPCT in the last 72 hours. Sepsis Labs:  Recent Labs Lab 05/05/16 1730 05/06/16 0130  LATICACIDVEN 3.7* 3.9*    Recent Results (from the past 240 hour(s))  Urine culture     Status: Abnormal   Collection Time: 05/04/16 11:21 PM  Result Value Ref Range Status   Specimen Description URINE, RANDOM  Final   Special Requests NONE  Final   Culture 60,000 COLONIES/mL ESCHERICHIA COLI (A)  Final   Report Status 05/07/2016 FINAL  Final   Organism ID, Bacteria ESCHERICHIA COLI (A)  Final      Susceptibility   Escherichia coli - MIC*    AMPICILLIN >=32 RESISTANT Resistant     CEFAZOLIN <=4 SENSITIVE Sensitive     CEFTRIAXONE <=1 SENSITIVE Sensitive     CIPROFLOXACIN >=4 RESISTANT Resistant     GENTAMICIN <=1 SENSITIVE Sensitive     IMIPENEM <=0.25 SENSITIVE Sensitive     NITROFURANTOIN <=16 SENSITIVE Sensitive     TRIMETH/SULFA >=320 RESISTANT Resistant     AMPICILLIN/SULBACTAM >=32 RESISTANT Resistant     PIP/TAZO <=4 SENSITIVE Sensitive     Extended ESBL NEGATIVE Sensitive     * 60,000 COLONIES/mL ESCHERICHIA COLI  Blood Culture (routine x 2)     Status: None   Collection Time: 05/04/16 11:40 PM  Result Value Ref Range Status   Specimen Description BLOOD LEFT ARM  Final   Special Requests IN PEDIATRIC BOTTLE 3ML  Final   Culture  NO GROWTH 5 DAYS  Final   Report Status 05/10/2016 FINAL  Final  Blood Culture (routine x 2)     Status: Abnormal   Collection Time: 05/04/16 11:49 PM  Result Value Ref Range Status   Specimen Description BLOOD RIGHT ARM  Final   Special Requests AEROBIC BOTTLE ONLY 5ML  Final   Culture  Setup Time   Final    GRAM POSITIVE COCCI IN CLUSTERS AEROBIC BOTTLE ONLY Organism ID to follow CRITICAL RESULT CALLED TO, READ BACK BY AND VERIFIED WITH: Leatha Gilding.D. 21:25 05/05/16 ([email or web address])    Culture (A)  Final    STAPHYLOCOCCUS SPECIES (COAGULASE NEGATIVE) THE SIGNIFICANCE OF ISOLATING THIS ORGANISM FROM A SINGLE SET OF BLOOD CULTURES WHEN MULTIPLE SETS ARE DRAWN IS UNCERTAIN. PLEASE NOTIFY THE MICROBIOLOGY DEPARTMENT WITHIN ONE WEEK IF SPECIATION AND SENSITIVITIES ARE REQUIRED.    Report Status 05/07/2016 FINAL  Final  Blood Culture ID Panel (Reflexed)     Status: Abnormal   Collection Time: 05/04/16 11:49 PM  Result Value Ref Range Status   Enterococcus species NOT DETECTED NOT DETECTED Final   Listeria monocytogenes NOT DETECTED NOT DETECTED Final   Staphylococcus species DETECTED (A) NOT DETECTED Final    Comment: CRITICAL RESULT CALLED TO, READ BACK BY AND VERIFIED WITH: Leatha Gilding.D. 21:25 05/05/16 ([email or web address])    Staphylococcus aureus NOT DETECTED NOT DETECTED Final   Methicillin resistance DETECTED (A) NOT DETECTED Final    Comment: CRITICAL RESULT CALLED TO, READ BACK BY AND VERIFIED WITH: Leatha Gilding.D. 21:25 05/05/16 ([email or web address])    Streptococcus species NOT DETECTED NOT DETECTED Final   Streptococcus agalactiae NOT DETECTED NOT DETECTED Final   Streptococcus pneumoniae NOT DETECTED NOT DETECTED Final   Streptococcus pyogenes NOT DETECTED NOT DETECTED Final   Acinetobacter baumannii NOT DETECTED NOT DETECTED Final   Enterobacteriaceae species NOT DETECTED NOT DETECTED Final   Enterobacter cloacae complex NOT DETECTED NOT DETECTED Final   Escherichia coli NOT DETECTED NOT  DETECTED Final   Klebsiella oxytoca NOT DETECTED NOT DETECTED  Final   Klebsiella pneumoniae NOT DETECTED NOT DETECTED Final   Proteus species NOT DETECTED NOT DETECTED Final   Serratia marcescens NOT DETECTED NOT DETECTED Final   Haemophilus influenzae NOT DETECTED NOT DETECTED Final   Neisseria meningitidis NOT DETECTED NOT DETECTED Final   Pseudomonas aeruginosa NOT DETECTED NOT DETECTED Final   Candida albicans NOT DETECTED NOT DETECTED Final   Candida glabrata NOT DETECTED NOT DETECTED Final   Candida krusei NOT DETECTED NOT DETECTED Final   Candida parapsilosis NOT DETECTED NOT DETECTED Final   Candida tropicalis NOT DETECTED NOT DETECTED Final  MRSA culture     Status: None   Collection Time: 05/05/16  7:39 AM  Result Value Ref Range Status   Specimen Description NASAL SWAB  Final   Special Requests NONE  Final   Culture NO MRSA DETECTED  Final   Report Status 05/07/2016 FINAL  Final  Culture, respiratory (NON-Expectorated)     Status: None   Collection Time: 05/05/16  4:30 PM  Result Value Ref Range Status   Specimen Description TRACHEAL ASPIRATE  Final   Special Requests NONE  Final   Gram Stain   Final    MODERATE WBC PRESENT,BOTH PMN AND MONONUCLEAR NO ORGANISMS SEEN    Culture Consistent with normal respiratory flora.  Final   Report Status 05/08/2016 FINAL  Final  C difficile quick scan w PCR reflex     Status: None   Collection Time: 05/05/16  4:50 PM  Result Value Ref Range Status   C Diff antigen NEGATIVE NEGATIVE Final   C Diff toxin NEGATIVE NEGATIVE Final   C Diff interpretation No C. difficile detected.  Final         Radiology Studies: No results found.      Scheduled Meds: . sodium chloride  10 mL/hr Intravenous Once  . cefTRIAXone (ROCEPHIN)  IV  1 g Intravenous Q24H  . famotidine  20 mg Oral BID  . feeding supplement (ENSURE ENLIVE)  237 mL Oral BID BM  . insulin aspart  0-15 Units Subcutaneous TID WC  . insulin aspart  0-5 Units  Subcutaneous QHS  . sodium chloride  1,000 mL Intravenous Once   Continuous Infusions: . sodium chloride 10 mL/hr at 05/09/16 1900     LOS: 7 days    Time spent: 35 minutes    Loretha Stapler, MD [facility] Pager [phone]  If 7PM-7AM, please contact night-coverage [email or web address] Password [facility] 05/12/2016, 3:37 PM

## 2016-05-13 ENCOUNTER — Inpatient Hospital Stay (HOSPITAL_COMMUNITY): Payer: Medicare Other

## 2016-05-13 DIAGNOSIS — D649 Anemia, unspecified: Secondary | ICD-10-CM

## 2016-05-13 HISTORY — PX: IR GENERIC HISTORICAL: IMG1180011

## 2016-05-13 LAB — CBC WITH DIFFERENTIAL/PLATELET
BASOS ABS: 0 10*3/uL (ref 0.0–0.1)
Basophils Relative: 0 %
EOS PCT: 2 %
Eosinophils Absolute: 0.3 10*3/uL (ref 0.0–0.7)
HEMATOCRIT: 23.3 % — AB (ref 36.0–46.0)
HEMOGLOBIN: 7.5 g/dL — AB (ref 12.0–15.0)
Lymphocytes Relative: 31 %
Lymphs Abs: 4 10*3/uL (ref 0.7–4.0)
MCH: 28 pg (ref 26.0–34.0)
MCHC: 32.2 g/dL (ref 30.0–36.0)
MCV: 86.9 fL (ref 78.0–100.0)
MONOS PCT: 6 %
Monocytes Absolute: 0.8 10*3/uL (ref 0.1–1.0)
NEUTROS ABS: 7.8 10*3/uL — AB (ref 1.7–7.7)
Neutrophils Relative %: 61 %
Platelets: 279 10*3/uL (ref 150–400)
RBC: 2.68 MIL/uL — ABNORMAL LOW (ref 3.87–5.11)
RDW: 17.6 % — ABNORMAL HIGH (ref 11.5–15.5)
WBC: 12.9 10*3/uL — ABNORMAL HIGH (ref 4.0–10.5)

## 2016-05-13 LAB — GLUCOSE, CAPILLARY
GLUCOSE-CAPILLARY: 106 mg/dL — AB (ref 65–99)
GLUCOSE-CAPILLARY: 88 mg/dL (ref 65–99)
GLUCOSE-CAPILLARY: 118 mg/dL — AB (ref 65–99)
Glucose-Capillary: 95 mg/dL (ref 65–99)

## 2016-05-13 LAB — BASIC METABOLIC PANEL
Anion gap: 7 (ref 5–15)
BUN: 8 mg/dL (ref 6–20)
CHLORIDE: 116 mmol/L — AB (ref 101–111)
CO2: 23 mmol/L (ref 22–32)
Calcium: 9.1 mg/dL (ref 8.9–10.3)
Creatinine, Ser: 0.51 mg/dL (ref 0.44–1.00)
GFR calc Af Amer: >60 mL/min (ref >60)
GFR calc non Af Amer: >60 mL/min (ref >60)
GLUCOSE: 95 mg/dL (ref 65–99)
POTASSIUM: 3.2 mmol/L — AB (ref 3.5–5.1)
Sodium: 146 mmol/L — ABNORMAL HIGH (ref 135–145)

## 2016-05-13 LAB — PROTIME-INR
INR: 1.18
PROTHROMBIN TIME: 15 s (ref 11.4–15.2)

## 2016-05-13 MED ORDER — LIDOCAINE HCL 1 % IJ SOLN
INTRAMUSCULAR | Status: AC
  Filled 2016-05-13: qty 20

## 2016-05-13 MED ORDER — POTASSIUM CHLORIDE CRYS ER 20 MEQ PO TBCR
30.0000 meq | EXTENDED_RELEASE_TABLET | Freq: Two times a day (BID) | ORAL | Status: DC
  Administered 2016-05-13 – 2016-05-17 (×8): 30 meq via ORAL
  Filled 2016-05-13 (×8): qty 1

## 2016-05-13 MED ORDER — IOPAMIDOL (ISOVUE-300) INJECTION 61%
INTRAVENOUS | Status: AC
  Administered 2016-05-13: 20 mL
  Filled 2016-05-13: qty 50

## 2016-05-13 MED ORDER — FENTANYL CITRATE (PF) 100 MCG/2ML IJ SOLN
INTRAMUSCULAR | Status: AC | PRN
  Administered 2016-05-13 (×3): 50 ug via INTRAVENOUS

## 2016-05-13 MED ORDER — MIDAZOLAM HCL 2 MG/2ML IJ SOLN
INTRAMUSCULAR | Status: AC
  Filled 2016-05-13: qty 6

## 2016-05-13 MED ORDER — LIDOCAINE HCL 1 % IJ SOLN
INTRAMUSCULAR | Status: AC | PRN
  Administered 2016-05-13: 20 mL

## 2016-05-13 MED ORDER — MIDAZOLAM HCL 2 MG/2ML IJ SOLN
INTRAMUSCULAR | Status: AC | PRN
Start: 2016-05-13 — End: 2016-05-13
  Administered 2016-05-13 (×3): 1 mg via INTRAVENOUS

## 2016-05-13 MED ORDER — FENTANYL CITRATE (PF) 100 MCG/2ML IJ SOLN
INTRAMUSCULAR | Status: AC
  Filled 2016-05-13: qty 4

## 2016-05-13 MED ORDER — ACETAMINOPHEN 325 MG PO TABS
650.0000 mg | ORAL_TABLET | Freq: Four times a day (QID) | ORAL | Status: DC | PRN
  Administered 2016-05-13 – 2016-05-14 (×2): 650 mg via ORAL
  Filled 2016-05-13 (×2): qty 2

## 2016-05-13 NOTE — Progress Notes (Signed)
Referring Physician(s):  Dahlstedt,S  Supervising Physician: Corrie Mckusick  Patient Status:  [facility] - In-pt  Chief Complaint:  Bilateral hydronephrosis, left renal stones  Subjective: Pt familiar to IR service from prior left PCN in July of this year for obstructive hydronephrosis/stones/urosepsis; /exchanges have been performed since with latest on 04/19/16; she was admitted to [facility] on 11/22 with AMS, oliguria/sepsis protocol, intubated for airway protection, given antibiotics for e coli UTI and subsequently extubated on 11/26; previous attempts at left PCNL have been cancelled due to pelvic infection. CT A/P from 11/23 revealed:  1. Diffuse bladder wall thickening, with complex fluid tracking about the bladder and uterus, but no defined abscess at this time. Diffuse soft tissue inflammation extends about the lower abdomen and pelvis. This is concerning for diffuse pelvic infection and cystitis. 2. Mild to moderate bilateral hydronephrosis, with a left-sided nephrostomy tube. Large obstructing left ureteral stone measures 1.9 x 1.0 cm, 2 cm above the left renal pelvis. Right-sided hydronephrosis appears to reflect the underlying bladder process. 3. Nonobstructing left renal stones measure up to 1.9 cm in size. 4. Mild wall thickening about the proximal sigmoid colon. This may be reactive secondary to diffuse pelvic inflammation. 5. Multiple small uterine fibroids noted. 6. Mild bibasilar atelectasis noted. 7. Scattered aortic atherosclerosis Request now received from urology for left PCN exchange due to low output as well as right PCN due to new onset hydronephrosis. Blood cx's from 11/23 grew MRSA. Past Medical History:  Diagnosis Date  . Acute renal failure superimposed on stage 3 chronic kidney disease ([place]) 07/21/2015  . Anemia    chronic   . Diabetes mellitus without complication ([place])   . Diabetic neuropathy ([place])   . DVT (deep venous thrombosis) ([place])   . DVT (deep venous  thrombosis) ([place])   . GERD (gastroesophageal reflux disease)   . Hx of sepsis   . Hypertension   . Left nephrolithiasis   . Leukocytosis   . Lymph edema    chronic   . Malnutrition of moderate degree 07/21/2015  . Multiple sclerosis diagnosed 2002-not on Therapy any longer 06/26/2012  . Muscle weakness (generalized)   . Neuromuscular disorder ([place])   . Neuromuscular dysfunction of bladder   . Polyneuropathy ([place])   . Presence of indwelling urinary catheter   . Protein calorie malnutrition ([place])   . Stage 4 decubitus ulcer ([place]) 07/05/2015  . Stroke ([place])   . UTI (lower urinary tract infection)    Past Surgical History:  Procedure Laterality Date  . ESOPHAGOGASTRODUODENOSCOPY Left 01/02/2015   Procedure: ESOPHAGOGASTRODUODENOSCOPY (EGD);  Surgeon: Arta Silence, MD;  Location: [facility];  Service: Endoscopy;  Laterality: Left;  . HERNIA MESH REMOVAL    . IR GENERIC HISTORICAL  02/23/2016   IR NEPHROSTOMY EXCHANGE LEFT 02/23/2016 Sandi Mariscal, MD [facility]-INTERV RAD  . IR GENERIC HISTORICAL  04/19/2016   IR NEPHROSTOMY EXCHANGE LEFT 04/19/2016 Arne Cleveland, MD [facility]-INTERV RAD  . NEPHROSTOMY TUBE PLACEMENT ([place] HX)    . TRANSURETHRAL RESECTION OF BLADDER TUMOR N/A 03/16/2016   Procedure: CYSTOSCOPY BLADDER BIOPSY;  Surgeon: Franchot Gallo, MD;  Location: [facility] ORS;  Service: Urology;  Laterality: N/A;  . TUBAL LIGATION    . tubes tided       Allergies: Patient has no known allergies.  Medications: Prior to Admission medications   Medication Sig Start Date End Date Taking? Authorizing Provider  acetaminophen (TYLENOL) 325 MG tablet Take 2 tablets (650 mg total) by mouth every 6 (six) hours as needed  for fever. 03/20/16  Yes Bonnielee Haff, MD  albuterol (PROVENTIL) (2.5 MG/3ML) 0.083% nebulizer solution Take 3 mLs (2.5 mg total) by nebulization every 6 (six) hours as needed for wheezing or shortness of breath. 12/30/15  Yes Robbie Lis, MD  Amino Acids-Protein Hydrolys (FEEDING  SUPPLEMENT, PRO-STAT SUGAR FREE 64,) LIQD Take 30 mLs by mouth 2 (two) times daily. 11/18/15  Yes Cherene Altes, MD  atorvastatin (LIPITOR) 10 MG tablet Take 10 mg by mouth at bedtime.    Yes Historical Provider, MD  baclofen (LIORESAL) 10 MG tablet Take 1 tablet (10 mg total) by mouth 3 (three) times daily. 11/18/15  Yes Cherene Altes, MD  famotidine (PEPCID) 20 MG tablet Take 1 tablet (20 mg total) by mouth 2 (two) times daily. 11/18/15  Yes Cherene Altes, MD  furosemide (LASIX) 20 MG tablet Take 40 mg by mouth 2 (two) times daily.  01/29/16  Yes Historical Provider, MD  gabapentin (NEURONTIN) 100 MG capsule Take 1 capsule (100 mg total) by mouth at bedtime. 11/18/15  Yes Cherene Altes, MD  glipiZIDE (GLUCOTROL) 5 MG tablet Take 5 mg by mouth daily before breakfast.   Yes Historical Provider, MD  lisinopril (PRINIVIL,ZESTRIL) 2.5 MG tablet Take 2.5 mg by mouth daily.  11/30/15  Yes Historical Provider, MD  metFORMIN (GLUCOPHAGE) 500 MG tablet Take 0.5 tablets (250 mg total) by mouth 2 (two) times daily. 04/22/16  Yes Dinah C Ngetich, NP  Multiple Vitamin (MULTIVITAMIN WITH MINERALS) TABS tablet Take 1 tablet by mouth daily. 07/09/15  Yes Florencia Reasons, MD  omega-3 acid ethyl esters (LOVAZA) 1 g capsule Take 1 g by mouth daily.   Yes Historical Provider, MD  polyethylene glycol (MIRALAX / GLYCOLAX) packet Take 17 g by mouth 2 (two) times daily. 11/18/15  Yes Cherene Altes, MD  potassium chloride (K-DUR,KLOR-CON) 10 MEQ tablet Take 1 tablet (10 mEq total) by mouth daily. 03/21/16  Yes Bonnielee Haff, MD  rivaroxaban (XARELTO) 20 MG TABS tablet Take 1 tablet (20 mg total) by mouth daily with supper. Reported on 08/12/2015 11/18/15  Yes Cherene Altes, MD  senna-docusate (SENOKOT-S) 8.6-50 MG tablet Take 1 tablet by mouth 2 (two) times daily. 12/30/15  Yes Robbie Lis, MD  solifenacin (VESICARE) 5 MG tablet Take 1 tablet (5 mg total) by mouth daily. 11/18/15  Yes Cherene Altes, MD     Vital  Signs: BP (!) 106/47 (BP Location: Right Arm)   Pulse 75   Temp 99.5 F (37.5 C) (Oral)   Resp 16   Ht 5' 5.5" (1.664 m)   Wt 175 lb 6.4 oz (79.6 kg)   SpO2 96%   BMI 28.74 kg/m   Physical Exam awake/alert; chest- sl dim BS bases; heart- RRR; abd- soft,+BS,NT; left PCN with small amt yellow urine in bag, site mildly tender  Imaging: No results found.  Labs:  CBC:  Recent Labs  05/07/16 0455 05/08/16 0405 05/09/16 0500 05/13/16 0416  WBC 22.4* 18.1* 15.5* 12.9*  HGB 9.0* 8.7* 8.8* 7.5*  HCT 25.8* 24.5* 26.3* 23.3*  PLT 205 176 169 279    COAGS:  Recent Labs  03/08/16 2119 03/09/16 0720 03/09/16 1401 03/10/16 0740 03/11/16 0406 05/04/16 2339 05/05/16 0300  INR 1.68  --   --   --  1.20 5.20* 2.37  APTT 45* 47* 51* 39*  --   --   --     BMP:  Recent Labs  05/07/16 1428 05/08/16 0405 05/09/16 0500  05/13/16 0416  NA 141 144 145 146*  K 3.3* 3.0* 3.7 3.2*  CL 119* 121* 122* 116*  CO2 14* 18* 19* 23  GLUCOSE 180* 226* 105* 95  BUN 25* 29* 22* 8  CALCIUM 8.1* 8.4* 8.9 9.1  CREATININE 0.86 0.71 0.52 0.51  GFRNONAA >60 >60 >60 >60  GFRAA >60 >60 >60 >60    LIVER FUNCTION TESTS:  Recent Labs  12/18/15 1035 12/19/15 0744  02/18/16 03/08/16 1240 03/24/16 05/05/16 0032  BILITOT 0.7 0.5  --   --  0.6  --  1.3*  AST 15 12*  < > 15 21 10* 37  ALT 8* 7*  < > 15 13* 11 17  ALKPHOS 40 50  < > 53 40 66 37*  PROT 6.6 6.6  --   --  7.7  --  5.3*  ALBUMIN 2.2* 2.3*  --   --  3.2*  --  2.2*  < > = values in this interval not displayed.  Assessment and Plan: 55 year old female with hx MS, CKD III, DM, GERD, HTN, nephrolithiasis, sacral decub, and neuromuscular disorder of bladder. She has a chronic indwelling left  nephrostomy tube and bilateral hydronephrosis due to obstructing urinary calculi. Recent admission for septic shock from urinary source at which time she was evaluated by urology, cystoscopy did not reveal any evidence of fistula. Plan was for her  to undergo nephrolithotomy on 11/30 (Dr Diona Fanti) but procedure cancelled due to pelvic infection. Marland Kitchen She was discharged to [facility] with 2 weeks of antibiotics via PICC line, which was discontinued on 11/21.She was brought in by EMS on 11/22 with altered mental status in the form of lethargy for one day and no urine output. Frank pus was noted both in the Foley and nephrostomy tube, her Foley was coated wfeces, she was hypotensive and sepsis protocol was initiated. She was intubated for airway protection, given Zosyn and vancomycin. Initial hemoglobin was 4 but on repeat this was noted to be 11. PCCM admitted pt and pt extubated on 11/26, TRH assumed care 11/28; poor output from left PCN now with new rt hydronephrosis on latest imaging. Request now received for left PCN exchange and new rt PCN placement. Imaging studies were reviewed by Dr. Earleen Newport. Plan is to reassess rt kidney for hydronephrosis with [place] today and if significant place PCN; left PCN will also be exchanged. Above plans d/w pt/sister with their apparent understanding and consent.   Electronically Signed: D. Rowe Robert 05/13/2016, 9:19 AM   I spent a total of 20 minutes at the the patient's bedside AND on the patient's hospital floor or unit, greater than 50% of which was counseling/coordinating care for left nephrostomy exchange and possible right nephrostomy placement    Patient ID: [name], female   DOB: 16-May-1961, 55 y.o.   MRN: [id number]

## 2016-05-13 NOTE — Progress Notes (Signed)
PROGRESS NOTE    [name]  [id number] DOB: Oct 26, 1960 DOA: 05/04/2016 PCP: Blanchie Serve, MD    Brief Narrative:  55 year old female with hx MS, CKD III, DM, GERD, HTN, nephrolithiasis, sacral decub, and neuromuscular disorder of bladder. She has a chronic indwelling nephrostomy tube and bilateral hydronephrosis due to obstructing urinary calculi. Recent admission for septic shock from urinary source at which time she was evaluated by urology, cystoscopy did not reveal any evidence of fistula. Plan was for her to undergo nephrolithotomy on 11/30 (Dr Diona Fanti). She was discharged to [facility] with 2 weeks of antibiotics via PICC line, which was discontinued on 11/21.  She was brought in by EMS on 11/22 with altered mental status in the form of lethargy for one day and no urine output. Frank pus was noted both in the Foley and nephrostomy tube, her Foley was coated wfeces, she was hypotensive and sepsis protocol was initiated. She was intubated for airway protection, given Zosyn and vancomycin. Initial hemoglobin was 4 but on repeat this was noted to be 11. PCCM admitted pt and pt extubated on 11/26, [facility] assumed care 11/28.  Urology consulted.  Patient underwent PCN tube placement on 12/1 as well as changing of left nephrostomy tube on 12/1.   Assessment & Plan:   Active Problems:   Septic shock (HCC)   Acute respiratory failure with hypoxia (HCC)   Acute respiratory failure Intubated for airway protection, extubated 11/26 - Pulmonary hygiene - currently pt maintaining oxygen saturations at target range  - no respiratory distress   Septic shock, sepsis secondary to pelvic infection, cystitis, E. Coli UTI - Coag neg staph 1 of 2 blood cx likely contaminant  - Ceftriaxone as above as sensitivity report indicate resistance to multiple PO ABX - Defer rx for coag neg staph for now   AKI, metabolic acidosis  - Obstructive hydronephrosis/ pyonephrosis s/p left nephrostomy  tube  - per PCCM, plan to consult urology this admission, suspect that this illness will delay plans for lithotomy  - Cr is now WNL  - repeat BMP in AM - Urology consulted   UGIB, anemia of chronic disease  - Stool occult blood positive - no further evidence of bleeding  - Holding xarelto for now - H/H trending down - will need to repeat CBC in am  Hypokalemia - supplemented with PO KCl - will need repeat BMP in am  Chronic DVT, last study 02/2015 - plan to restart xarelto prior to d/c home - follow CBC to insure Hgb stable however H/H trending down  At risk for relative adrenal insufficiency  - Stress dose steroids >stop 11/27  Multiple sclerosis  - stable for now  DVT prophylaxis: SCD's, resume Xarelto once ensured Hg stable  Code Status: Full  Family Communication: Patient at bedside  Disposition Plan: pending clearance by Urology    Consultants:   PCCM  Urology  Procedures:   Intubation  Antimicrobials:  ? 11/22 zosyn>>11/24 ? 11/22 vanc >>11/24  ceftriaxone 11/24>>>    Subjective: Patient in bed and states she is going to get tubes placed today.  She says she wishes she was ready to discharge from the hospital.  Denies any pain.  Says she had a bowel movement last night- denies frank blood or melanotic stool.   Objective: Vitals:   05/12/16 0453 05/12/16 1550 05/12/16 2138 05/13/16 0600  BP: 128/60 120/62 (!) 131/58 (!) 106/47  Pulse: 80 95 80 75  Resp: 16 16 18  16  Temp: 98.9 F (37.2 C) 99.2 F (37.3 C) 98.9 F (37.2 C) 99.5 F (37.5 C)  TempSrc: Oral Oral Oral Oral  SpO2: 100% 91% 98% 96%  Weight: 79.4 kg (175 lb)   79.6 kg (175 lb 6.4 oz)  Height:        Intake/Output Summary (Last 24 hours) at 05/13/16 1437 Last data filed at 05/13/16 1216  Gross per 24 hour  Intake             1150 ml  Output             1875 ml  Net             -725 ml   Filed Weights   05/11/16 0602 05/12/16 0453 05/13/16 0600  Weight: 71.8 kg (158 lb  4.6 oz) 79.4 kg (175 lb) 79.6 kg (175 lb 6.4 oz)    Examination:  General exam: Appears calm and comfortable  Respiratory system: Clear to auscultation. Respiratory effort normal. Cardiovascular system: S1 & S2 heard, RRR. No JVD, murmurs, rubs, gallops or clicks. No pedal edema. Gastrointestinal system: Abdomen is nondistended, soft and nontender. No organomegaly or masses felt. Normal bowel sounds heard. Nephrostomy tube bag empty Central nervous system: Alert and oriented to self- able to answer questions, somewhat confused as to what has happened during hospitalization. No focal neurological deficits. Extremities: Symmetric 5 x 5 power. Skin: No rashes, lesions or ulcers, bilateral skin thickening of lower extremities bilaterally especially ventral aspect of feet      Data Reviewed: I have personally reviewed following labs and imaging studies  CBC:  Recent Labs Lab 05/07/16 0455 05/08/16 0405 05/09/16 0500 05/13/16 0416  WBC 22.4* 18.1* 15.5* 12.9*  NEUTROABS  --   --   --  7.8*  HGB 9.0* 8.7* 8.8* 7.5*  HCT 25.8* 24.5* 26.3* 23.3*  MCV 83.0 83.3 85.7 86.9  PLT 205 176 169 [id number]   Basic Metabolic Panel:  Recent Labs Lab 05/07/16 0455 05/07/16 1428 05/08/16 0405 05/08/16 1800 05/09/16 0500 05/13/16 0416  NA 142 141 144  --  145 146*  K 2.3* 3.3* 3.0*  --  3.7 3.2*  CL 117* 119* 121*  --  122* 116*  CO2 17* 14* 18*  --  19* 23  GLUCOSE 141* 180* 226*  --  105* 95  BUN 25* 25* 29*  --  22* 8  CREATININE 0.83 0.86 0.71  --  0.52 0.51  CALCIUM 8.3* 8.1* 8.4*  --  8.9 9.1  MG 1.5* 2.2  --  2.0  --   --   PHOS 2.2*  --   --  2.2*  --   --    GFR: Estimated Creatinine Clearance: 81.8 mL/min (by C-G formula based on SCr of 0.51 mg/dL). Liver Function Tests: No results for input(s): AST, ALT, ALKPHOS, BILITOT, PROT, ALBUMIN in the last 168 hours. No results for input(s): LIPASE, AMYLASE in the last 168 hours. No results for input(s): AMMONIA in the last 168  hours. Coagulation Profile:  Recent Labs Lab 05/13/16 1036  INR 1.18   Cardiac Enzymes: No results for input(s): CKTOTAL, CKMB, CKMBINDEX, TROPONINI in the last 168 hours. BNP (last 3 results) No results for input(s): PROBNP in the last 8760 hours. HbA1C: No results for input(s): HGBA1C in the last 72 hours. CBG:  Recent Labs Lab 05/12/16 1140 05/12/16 1656 05/12/16 2144 05/13/16 0733 05/13/16 1142  GLUCAP 149* 136* 164* 95 106*   Lipid Profile: No results for input(s):  CHOL, HDL, LDLCALC, TRIG, CHOLHDL, LDLDIRECT in the last 72 hours. Thyroid Function Tests: No results for input(s): TSH, T4TOTAL, FREET4, T3FREE, THYROIDAB in the last 72 hours. Anemia Panel: No results for input(s): VITAMINB12, FOLATE, FERRITIN, TIBC, IRON, RETICCTPCT in the last 72 hours. Sepsis Labs: No results for input(s): PROCALCITON, LATICACIDVEN in the last 168 hours.  Recent Results (from the past 240 hour(s))  Urine culture     Status: Abnormal   Collection Time: 05/04/16 11:21 PM  Result Value Ref Range Status   Specimen Description URINE, RANDOM  Final   Special Requests NONE  Final   Culture 60,000 COLONIES/mL ESCHERICHIA COLI (A)  Final   Report Status 05/07/2016 FINAL  Final   Organism ID, Bacteria ESCHERICHIA COLI (A)  Final      Susceptibility   Escherichia coli - MIC*    AMPICILLIN >=32 RESISTANT Resistant     CEFAZOLIN <=4 SENSITIVE Sensitive     CEFTRIAXONE <=1 SENSITIVE Sensitive     CIPROFLOXACIN >=4 RESISTANT Resistant     GENTAMICIN <=1 SENSITIVE Sensitive     IMIPENEM <=0.25 SENSITIVE Sensitive     NITROFURANTOIN <=16 SENSITIVE Sensitive     TRIMETH/SULFA >=320 RESISTANT Resistant     AMPICILLIN/SULBACTAM >=32 RESISTANT Resistant     PIP/TAZO <=4 SENSITIVE Sensitive     Extended ESBL NEGATIVE Sensitive     * 60,000 COLONIES/mL ESCHERICHIA COLI  Blood Culture (routine x 2)     Status: None   Collection Time: 05/04/16 11:40 PM  Result Value Ref Range Status   Specimen  Description BLOOD LEFT ARM  Final   Special Requests IN PEDIATRIC BOTTLE 3ML  Final   Culture NO GROWTH 5 DAYS  Final   Report Status 05/10/2016 FINAL  Final  Blood Culture (routine x 2)     Status: Abnormal   Collection Time: 05/04/16 11:49 PM  Result Value Ref Range Status   Specimen Description BLOOD RIGHT ARM  Final   Special Requests AEROBIC BOTTLE ONLY 5ML  Final   Culture  Setup Time   Final    GRAM POSITIVE COCCI IN CLUSTERS AEROBIC BOTTLE ONLY Organism ID to follow CRITICAL RESULT CALLED TO, READ BACK BY AND VERIFIED WITH: Leatha Gilding.D. 21:25 05/05/16 ([email or web address])    Culture (A)  Final    STAPHYLOCOCCUS SPECIES (COAGULASE NEGATIVE) THE SIGNIFICANCE OF ISOLATING THIS ORGANISM FROM A SINGLE SET OF BLOOD CULTURES WHEN MULTIPLE SETS ARE DRAWN IS UNCERTAIN. PLEASE NOTIFY THE MICROBIOLOGY DEPARTMENT WITHIN ONE WEEK IF SPECIATION AND SENSITIVITIES ARE REQUIRED.    Report Status 05/07/2016 FINAL  Final  Blood Culture ID Panel (Reflexed)     Status: Abnormal   Collection Time: 05/04/16 11:49 PM  Result Value Ref Range Status   Enterococcus species NOT DETECTED NOT DETECTED Final   Listeria monocytogenes NOT DETECTED NOT DETECTED Final   Staphylococcus species DETECTED (A) NOT DETECTED Final    Comment: CRITICAL RESULT CALLED TO, READ BACK BY AND VERIFIED WITH: Leatha Gilding.D. 21:25 05/05/16 ([email or web address])    Staphylococcus aureus NOT DETECTED NOT DETECTED Final   Methicillin resistance DETECTED (A) NOT DETECTED Final    Comment: CRITICAL RESULT CALLED TO, READ BACK BY AND VERIFIED WITH: Leatha Gilding.D. 21:25 05/05/16 ([email or web address])    Streptococcus species NOT DETECTED NOT DETECTED Final   Streptococcus agalactiae NOT DETECTED NOT DETECTED Final   Streptococcus pneumoniae NOT DETECTED NOT DETECTED Final   Streptococcus pyogenes NOT DETECTED NOT DETECTED Final   Acinetobacter baumannii NOT DETECTED NOT DETECTED Final  Enterobacteriaceae species NOT DETECTED NOT DETECTED Final    Enterobacter cloacae complex NOT DETECTED NOT DETECTED Final   Escherichia coli NOT DETECTED NOT DETECTED Final   Klebsiella oxytoca NOT DETECTED NOT DETECTED Final   Klebsiella pneumoniae NOT DETECTED NOT DETECTED Final   Proteus species NOT DETECTED NOT DETECTED Final   Serratia marcescens NOT DETECTED NOT DETECTED Final   Haemophilus influenzae NOT DETECTED NOT DETECTED Final   Neisseria meningitidis NOT DETECTED NOT DETECTED Final   Pseudomonas aeruginosa NOT DETECTED NOT DETECTED Final   Candida albicans NOT DETECTED NOT DETECTED Final   Candida glabrata NOT DETECTED NOT DETECTED Final   Candida krusei NOT DETECTED NOT DETECTED Final   Candida parapsilosis NOT DETECTED NOT DETECTED Final   Candida tropicalis NOT DETECTED NOT DETECTED Final  MRSA culture     Status: None   Collection Time: 05/05/16  7:39 AM  Result Value Ref Range Status   Specimen Description NASAL SWAB  Final   Special Requests NONE  Final   Culture NO MRSA DETECTED  Final   Report Status 05/07/2016 FINAL  Final  Culture, respiratory (NON-Expectorated)     Status: None   Collection Time: 05/05/16  4:30 PM  Result Value Ref Range Status   Specimen Description TRACHEAL ASPIRATE  Final   Special Requests NONE  Final   Gram Stain   Final    MODERATE WBC PRESENT,BOTH PMN AND MONONUCLEAR NO ORGANISMS SEEN    Culture Consistent with normal respiratory flora.  Final   Report Status 05/08/2016 FINAL  Final  C difficile quick scan w PCR reflex     Status: None   Collection Time: 05/05/16  4:50 PM  Result Value Ref Range Status   C Diff antigen NEGATIVE NEGATIVE Final   C Diff toxin NEGATIVE NEGATIVE Final   C Diff interpretation No C. difficile detected.  Final         Radiology Studies: No results found.      Scheduled Meds: . sodium chloride  10 mL/hr Intravenous Once  . cefTRIAXone (ROCEPHIN)  IV  1 g Intravenous Q24H  . famotidine  20 mg Oral BID  . feeding supplement (ENSURE ENLIVE)  237 mL  Oral BID BM  . insulin aspart  0-15 Units Subcutaneous TID WC  . insulin aspart  0-5 Units Subcutaneous QHS  . sodium chloride  1,000 mL Intravenous Once   Continuous Infusions: . sodium chloride 10 mL/hr at 05/09/16 1900     LOS: 8 days    Time spent: 35 minutes    Loretha Stapler, MD [facility] Pager [phone]  If 7PM-7AM, please contact night-coverage [email or web address] Password [email or web address] 05/13/2016, 2:37 PM

## 2016-05-13 NOTE — Sedation Documentation (Signed)
Patient is resting comfortably. 

## 2016-05-13 NOTE — Care Management Important Message (Signed)
Important Message  Patient Details  Name: [name] MRN: [id number] Date of Birth: [birth year]   Medicare Important Message Given:  Yes    Darrold Bezek Abena 05/13/2016, 1:23 PM

## 2016-05-13 NOTE — Procedures (Signed)
Interventional Radiology Procedure Note  Procedure: Left PCN exchange for new 27F tube.  Placement of new right PCN with 27F tube for dilated right collecting system.   Complications: None Recommendations:  - to gravity drainage - record output daily.  - Routine drain care   Signed,  Dulcy Fanny. Earleen Newport, DO

## 2016-05-13 NOTE — Progress Notes (Signed)
Subjective: Patient reports that she is feeling a bit better.  Objective: Vital signs in last 24 hours: Temp:  [98.9 F (37.2 C)-99.5 F (37.5 C)] 99.5 F (37.5 C) (12/01 0600) Pulse Rate:  [75-95] 75 (12/01 0600) Resp:  [16-18] 16 (12/01 0600) BP: (106-131)/(47-62) 106/47 (12/01 0600) SpO2:  [91 %-98 %] 96 % (12/01 0600) Weight:  [79.6 kg (175 lb 6.4 oz)] 79.6 kg (175 lb 6.4 oz) (12/01 0600)  Intake/Output from previous day: 11/30 0701 - 12/01 0700 In: 1540 [P.O.:700; I.V.:690; IV Piggyback:150] Out: 1500 [Urine:1500] Intake/Output this shift: No intake/output data recorded.  Physical Exam:  Constitutional: Vital signs reviewed. WD WN in NAD   Eyes: PERRL, No scleral icterus.   Pulmonary/Chest: Normal effort   Lab Results:  Recent Labs  05/13/16 0416  HGB 7.5*  HCT 23.3*   BMET  Recent Labs  05/13/16 0416  NA 146*  K 3.2*  CL 116*  CO2 23  GLUCOSE 95  BUN 8  CREATININE 0.51  CALCIUM 9.1   No results for input(s): LABPT, INR in the last 72 hours. No results for input(s): LABURIN in the last 72 hours. Results for orders placed or performed during the hospital encounter of 05/04/16  Urine culture     Status: Abnormal   Collection Time: 05/04/16 11:21 PM  Result Value Ref Range Status   Specimen Description URINE, RANDOM  Final   Special Requests NONE  Final   Culture 60,000 COLONIES/mL ESCHERICHIA COLI (A)  Final   Report Status 05/07/2016 FINAL  Final   Organism ID, Bacteria ESCHERICHIA COLI (A)  Final      Susceptibility   Escherichia coli - MIC*    AMPICILLIN >=32 RESISTANT Resistant     CEFAZOLIN <=4 SENSITIVE Sensitive     CEFTRIAXONE <=1 SENSITIVE Sensitive     CIPROFLOXACIN >=4 RESISTANT Resistant     GENTAMICIN <=1 SENSITIVE Sensitive     IMIPENEM <=0.25 SENSITIVE Sensitive     NITROFURANTOIN <=16 SENSITIVE Sensitive     TRIMETH/SULFA >=320 RESISTANT Resistant     AMPICILLIN/SULBACTAM >=32 RESISTANT Resistant     PIP/TAZO <=4 SENSITIVE  Sensitive     Extended ESBL NEGATIVE Sensitive     * 60,000 COLONIES/mL ESCHERICHIA COLI  Blood Culture (routine x 2)     Status: None   Collection Time: 05/04/16 11:40 PM  Result Value Ref Range Status   Specimen Description BLOOD LEFT ARM  Final   Special Requests IN PEDIATRIC BOTTLE 3ML  Final   Culture NO GROWTH 5 DAYS  Final   Report Status 05/10/2016 FINAL  Final  Blood Culture (routine x 2)     Status: Abnormal   Collection Time: 05/04/16 11:49 PM  Result Value Ref Range Status   Specimen Description BLOOD RIGHT ARM  Final   Special Requests AEROBIC BOTTLE ONLY 5ML  Final   Culture  Setup Time   Final    GRAM POSITIVE COCCI IN CLUSTERS AEROBIC BOTTLE ONLY Organism ID to follow CRITICAL RESULT CALLED TO, READ BACK BY AND VERIFIED WITH: Leatha Gilding.D. 21:25 05/05/16 ([email or web address])    Culture (A)  Final    STAPHYLOCOCCUS SPECIES (COAGULASE NEGATIVE) THE SIGNIFICANCE OF ISOLATING THIS ORGANISM FROM A SINGLE SET OF BLOOD CULTURES WHEN MULTIPLE SETS ARE DRAWN IS UNCERTAIN. PLEASE NOTIFY THE MICROBIOLOGY DEPARTMENT WITHIN ONE WEEK IF SPECIATION AND SENSITIVITIES ARE REQUIRED.    Report Status 05/07/2016 FINAL  Final  Blood Culture ID Panel (Reflexed)     Status: Abnormal  Collection Time: 05/04/16 11:49 PM  Result Value Ref Range Status   Enterococcus species NOT DETECTED NOT DETECTED Final   Listeria monocytogenes NOT DETECTED NOT DETECTED Final   Staphylococcus species DETECTED (A) NOT DETECTED Final    Comment: CRITICAL RESULT CALLED TO, READ BACK BY AND VERIFIED WITH: Leatha Gilding.D. 21:25 05/05/16 ([email or web address])    Staphylococcus aureus NOT DETECTED NOT DETECTED Final   Methicillin resistance DETECTED (A) NOT DETECTED Final    Comment: CRITICAL RESULT CALLED TO, READ BACK BY AND VERIFIED WITH: Leatha Gilding.D. 21:25 05/05/16 ([email or web address])    Streptococcus species NOT DETECTED NOT DETECTED Final   Streptococcus agalactiae NOT DETECTED NOT DETECTED Final   Streptococcus  pneumoniae NOT DETECTED NOT DETECTED Final   Streptococcus pyogenes NOT DETECTED NOT DETECTED Final   Acinetobacter baumannii NOT DETECTED NOT DETECTED Final   Enterobacteriaceae species NOT DETECTED NOT DETECTED Final   Enterobacter cloacae complex NOT DETECTED NOT DETECTED Final   Escherichia coli NOT DETECTED NOT DETECTED Final   Klebsiella oxytoca NOT DETECTED NOT DETECTED Final   Klebsiella pneumoniae NOT DETECTED NOT DETECTED Final   Proteus species NOT DETECTED NOT DETECTED Final   Serratia marcescens NOT DETECTED NOT DETECTED Final   Haemophilus influenzae NOT DETECTED NOT DETECTED Final   Neisseria meningitidis NOT DETECTED NOT DETECTED Final   Pseudomonas aeruginosa NOT DETECTED NOT DETECTED Final   Candida albicans NOT DETECTED NOT DETECTED Final   Candida glabrata NOT DETECTED NOT DETECTED Final   Candida krusei NOT DETECTED NOT DETECTED Final   Candida parapsilosis NOT DETECTED NOT DETECTED Final   Candida tropicalis NOT DETECTED NOT DETECTED Final  MRSA culture     Status: None   Collection Time: 05/05/16  7:39 AM  Result Value Ref Range Status   Specimen Description NASAL SWAB  Final   Special Requests NONE  Final   Culture NO MRSA DETECTED  Final   Report Status 05/07/2016 FINAL  Final  Culture, respiratory (NON-Expectorated)     Status: None   Collection Time: 05/05/16  4:30 PM  Result Value Ref Range Status   Specimen Description TRACHEAL ASPIRATE  Final   Special Requests NONE  Final   Gram Stain   Final    MODERATE WBC PRESENT,BOTH PMN AND MONONUCLEAR NO ORGANISMS SEEN    Culture Consistent with normal respiratory flora.  Final   Report Status 05/08/2016 FINAL  Final  C difficile quick scan w PCR reflex     Status: None   Collection Time: 05/05/16  4:50 PM  Result Value Ref Range Status   C Diff antigen NEGATIVE NEGATIVE Final   C Diff toxin NEGATIVE NEGATIVE Final   C Diff interpretation No C. difficile detected.  Final   I have reviewed the patient's  CT scan images.  There is new onset mild right hydronephrosis. Studies/Results: No results found.  Assessment:  1.  Left renal and distal ureteral stones, managed currently with percutaneous nephrostomy tube.  We have had to cancel 2 nephrolithotomies because of complications from a pelvic infection.  2.  New-onset right hydronephrosis-I would suggest that percutaneous nephrostomy tube placement at this point would be the favored method of management.  3.  Recurrent pelvic process-possibilities include perforation of the bladder from a catheter, which would be quite unusual, or an inflammatory process between the bladder and the colon.  Plan:  1.  I would recommend percutaneous nephrostomy tube placement on the right to divert her urine from this process in the pelvis, as well  as to drain her hydronephrotic kidney.  2.  Nursing staff informs me that her left nephrostomy tube has not been draining appropriately-I would suggest that this be changed by interventional radiology.  Concurrent with the right nephrostomy tube placement.  3.  We will obviously have to delay her nephrolithotomy indefinitely.       LOS: 8 days   Franchot Gallo M 05/13/2016, 7:33 AM

## 2016-05-14 LAB — CBC
HEMATOCRIT: 23.3 % — AB (ref 36.0–46.0)
Hemoglobin: 7.4 g/dL — ABNORMAL LOW (ref 12.0–15.0)
MCH: 27.8 pg (ref 26.0–34.0)
MCHC: 31.8 g/dL (ref 30.0–36.0)
MCV: 87.6 fL (ref 78.0–100.0)
Platelets: 327 10*3/uL (ref 150–400)
RBC: 2.66 MIL/uL — ABNORMAL LOW (ref 3.87–5.11)
RDW: 17.5 % — AB (ref 11.5–15.5)
WBC: 12.4 10*3/uL — AB (ref 4.0–10.5)

## 2016-05-14 LAB — GLUCOSE, CAPILLARY
GLUCOSE-CAPILLARY: 110 mg/dL — AB (ref 65–99)
GLUCOSE-CAPILLARY: 193 mg/dL — AB (ref 65–99)
Glucose-Capillary: 150 mg/dL — ABNORMAL HIGH (ref 65–99)
Glucose-Capillary: 131 mg/dL — ABNORMAL HIGH (ref 65–99)

## 2016-05-14 LAB — BASIC METABOLIC PANEL
ANION GAP: 7 (ref 5–15)
BUN: 7 mg/dL (ref 6–20)
CALCIUM: 9 mg/dL (ref 8.9–10.3)
CO2: 25 mmol/L (ref 22–32)
Chloride: 112 mmol/L — ABNORMAL HIGH (ref 101–111)
Creatinine, Ser: 0.54 mg/dL (ref 0.44–1.00)
Glucose, Bld: 108 mg/dL — ABNORMAL HIGH (ref 65–99)
Potassium: 3.6 mmol/L (ref 3.5–5.1)
SODIUM: 144 mmol/L (ref 135–145)

## 2016-05-14 MED ORDER — HEPARIN SODIUM (PORCINE) 5000 UNIT/ML IJ SOLN
5000.0000 [IU] | Freq: Three times a day (TID) | INTRAMUSCULAR | Status: DC
  Administered 2016-05-14 – 2016-05-15 (×2): 5000 [IU] via SUBCUTANEOUS
  Filled 2016-05-14 (×3): qty 1

## 2016-05-14 NOTE — Progress Notes (Signed)
Assessment and Plan: 1. Pelvic inflammatory process now s/p right NT placement.  Foley continues to drain.  She is afebrile but still tender in the SP area.  Reimage as clinically indicated. 2. Left renal stone with bilateral nephrostomy tubes draining well.  She will eventually need a left PCNL. Subjective: Ms. Cornely had successful placement of a right NT yesterday.  She has pain at the insertion site but both the NT's are draining clear urine.  The foley is draining blood tinged turbid urine.    ROS:  Review of Systems  Constitutional: Negative for chills and fever.  All other systems reviewed and are negative.   Anti-infectives: Anti-infectives    Start     Dose/Rate Route Frequency Ordered Stop   05/07/16 1430  cefTRIAXone (ROCEPHIN) 1 g in dextrose 5 % 50 mL IVPB     1 g 100 mL/hr over 30 Minutes Intravenous Every 24 hours 05/07/16 1408     05/06/16 1300  vancomycin (VANCOCIN) IVPB 750 mg/150 ml premix  Status:  Discontinued     750 mg 150 mL/hr over 60 Minutes Intravenous Every 12 hours 05/06/16 0817 05/07/16 1408   05/05/16 2300  vancomycin (VANCOCIN) IVPB 750 mg/150 ml premix  Status:  Discontinued     750 mg 150 mL/hr over 60 Minutes Intravenous Every 24 hours 05/05/16 0953 05/06/16 0817   05/05/16 2200  vancomycin (VANCOCIN) IVPB 750 mg/150 ml premix  Status:  Discontinued     750 mg 150 mL/hr over 60 Minutes Intravenous  Once 05/05/16 0541 05/05/16 0953   05/05/16 0600  piperacillin-tazobactam (ZOSYN) IVPB 3.375 g  Status:  Discontinued     3.375 g 12.5 mL/hr over 240 Minutes Intravenous Every 8 hours 05/05/16 0541 05/07/16 1408   05/04/16 2330  piperacillin-tazobactam (ZOSYN) IVPB 3.375 g     3.375 g 100 mL/hr over 30 Minutes Intravenous  Once 05/04/16 2321 05/05/16 0119   05/04/16 2330  vancomycin (VANCOCIN) IVPB 1000 mg/200 mL premix     1,000 mg 200 mL/hr over 60 Minutes Intravenous  Once 05/04/16 2321 05/05/16 0119      Current Facility-Administered  Medications  Medication Dose Route Frequency Provider Last Rate Last Dose  . 0.9 %  sodium chloride infusion  10 mL/hr Intravenous Once Ezequiel Essex, MD      . 0.9 %  sodium chloride infusion  250 mL Intravenous PRN Corey Harold, NP      . 0.9 %  sodium chloride infusion   Intravenous Continuous Collene Gobble, MD 10 mL/hr at 05/09/16 1900    . acetaminophen (TYLENOL) tablet 650 mg  650 mg Oral Q6H PRN Ritta Slot, NP   650 mg at 05/14/16 0420  . cefTRIAXone (ROCEPHIN) 1 g in dextrose 5 % 50 mL IVPB  1 g Intravenous Q24H Jose Shirl Harris, MD   1 g at 05/13/16 1606  . famotidine (PEPCID) tablet 20 mg  20 mg Oral BID Theodis Blaze, MD   20 mg at 05/14/16 1011  . feeding supplement (ENSURE ENLIVE) (ENSURE ENLIVE) liquid 237 mL  237 mL Oral BID BM Theodis Blaze, MD   237 mL at 05/14/16 1013  . fentaNYL (SUBLIMAZE) injection 12.5-50 mcg  12.5-50 mcg Intravenous Q1H PRN Marijean Heath, NP      . insulin aspart (novoLOG) injection 0-15 Units  0-15 Units Subcutaneous TID WC Collene Gobble, MD   2 Units at 05/12/16 1737  . insulin aspart (novoLOG) injection 0-5 Units  0-5 Units Subcutaneous QHS Collene Gobble, MD      . potassium chloride (K-DUR,KLOR-CON) CR tablet 30 mEq  30 mEq Oral BID Eber Jones, MD   30 mEq at 05/14/16 1011  . sodium chloride 0.9 % bolus 1,000 mL  1,000 mL Intravenous Once Ezequiel Essex, MD         Objective: Vital signs in last 24 hours: Temp:  [98.4 F (36.9 C)-98.9 F (37.2 C)] 98.7 F (37.1 C) (12/02 0559) Pulse Rate:  [69-82] 70 (12/02 0559) Resp:  [14-24] 17 (12/02 0559) BP: (123-153)/(57-92) 130/68 (12/02 0559) SpO2:  [98 %-100 %] 100 % (12/02 0559) Weight:  [78.2 kg (172 lb 6.4 oz)] 78.2 kg (172 lb 6.4 oz) (12/02 0559)  Intake/Output from previous day: 12/01 0701 - 12/02 0700 In: 3 [P.O.:560; IV Piggyback:50] Out: 1425 [Urine:1425] Intake/Output this shift: Total I/O In: 200 [P.O.:200] Out: 425 [Urine:425]   Physical Exam   Constitutional: She is well-developed, well-nourished, and in no distress.  Abdominal: Soft. She exhibits no distension. There is tenderness (in the suprapubic area. ).    Lab Results:   Recent Labs  05/13/16 0416 05/14/16 0534  WBC 12.9* 12.4*  HGB 7.5* 7.4*  HCT 23.3* 23.3*  PLT 279 327   BMET  Recent Labs  05/13/16 0416 05/14/16 0534  NA 146* 144  K 3.2* 3.6  CL 116* 112*  CO2 23 25  GLUCOSE 95 108*  BUN 8 7  CREATININE 0.51 0.54  CALCIUM 9.1 9.0   PT/INR  Recent Labs  05/13/16 1036  LABPROT 15.0  INR 1.18   ABG No results for input(s): PHART, HCO3 in the last 72 hours.  Invalid input(s): PCO2, PO2  Studies/Results: No results found.   CT films and IR images reviewed.   Labs reviewed.        LOS: 9 days    [name] 05/14/2016 [id number] ID: Veleta Miners, female   DOB: 1961-01-31, 55 y.o.   MRN: [id number]

## 2016-05-14 NOTE — Progress Notes (Signed)
PROGRESS NOTE    [name]  [id number] DOB: December 22, 1960 DOA: 05/04/2016 PCP: Blanchie Serve, MD    Brief Narrative:  55 year old female with hx MS, CKD III, DM, GERD, HTN, nephrolithiasis, sacral decub, and neuromuscular disorder of bladder. She has a chronic indwelling nephrostomy tube and bilateral hydronephrosis due to obstructing urinary calculi. Recent admission for septic shock from urinary source at which time she was evaluated by urology, cystoscopy did not reveal any evidence of fistula. Plan was for her to undergo nephrolithotomy on 11/30 (Dr Diona Fanti). She was discharged to [facility] with 2 weeks of antibiotics via PICC line, which was discontinued on 11/21.  She was brought in by EMS on 11/22 with altered mental status in the form of lethargy for one day and no urine output. Frank pus was noted both in the Foley and nephrostomy tube, her Foley was coated wfeces, she was hypotensive and sepsis protocol was initiated. She was intubated for airway protection, given Zosyn and vancomycin. Initial hemoglobin was 4 but on repeat this was noted to be 11. PCCM admitted pt and pt extubated on 11/26, [facility] assumed care 11/28.  Urology consulted.  Patient underwent PCN tube placement on 12/1 as well as changing of left nephrostomy tube on 12/1.   Assessment & Plan:   Active Problems:   Septic shock (HCC)   Acute respiratory failure with hypoxia (HCC)   Acute respiratory failure Intubated for airway protection, extubated 11/26 - Pulmonary hygiene - currently pt maintaining oxygen saturations at target range  - no respiratory distress   Septic shock, sepsis secondary to pelvic infection, cystitis, E. Coli UTI - Coag neg staph 1 of 2 blood cx likely contaminant  - Ceftriaxone as above as sensitivity report indicate resistance to multiple PO ABX - Defer rx for coag neg staph for now   AKI, metabolic acidosis  - Obstructive hydronephrosis/ pyonephrosis s/p left nephrostomy  tube  - per PCCM, plan to consult urology this admission, suspect that this illness will delay plans for lithotomy  - Cr is now WNL  - repeat BMP in AM - Urology consulted   UGIB, anemia of chronic disease  - Stool occult blood positive - no further evidence of bleeding  - Holding xarelto for now - H/H trending down - CBC in am  Hypokalemia - supplemented with PO KCl - will need repeat BMP in am  Chronic DVT, last study 02/2015 - considering restarting xarelto prior to d/c home - follow CBC to insure Hgb stable however H/H trending down  At risk for relative adrenal insufficiency  - Stress dose steroids >stopped 11/27  Multiple sclerosis  - stable for now  DVT prophylaxis: SCD's, resume Xarelto once ensured Hg stable  Code Status: Full  Family Communication: Patient at bedside  Disposition Plan: pending clearance by Urology    Consultants:   PCCM  Urology  PT/OT  Procedures:   Intubation  Antimicrobials:  ? 11/22 zosyn>>11/24 ? 11/22 vanc >>11/24  ceftriaxone 11/24>>>    Subjective: Patient having discomfort at sites of nephrostomy tube placement.  Both tubes draining clear yellow urine.  Foley catheter draining pink tinged urine.  No fevers or chills.  Patient is hopeful to discharge soon.   Objective: Vitals:   05/13/16 1717 05/13/16 2130 05/14/16 0559 05/14/16 1344  BP: 129/68 133/67 130/68 (!) 103/58  Pulse: 69 72 70 91  Resp: 16 17 17 18   Temp: 98.4 F (36.9 C) 98.8 F (37.1 C) 98.7 F (37.1 C)  98.6 F (37 C)  TempSrc: Oral Oral Oral Oral  SpO2: 100% 100% 100% 100%  Weight:   78.2 kg (172 lb 6.4 oz)   Height:        Intake/Output Summary (Last 24 hours) at 05/14/16 1543 Last data filed at 05/14/16 1358  Gross per 24 hour  Intake             1010 ml  Output             1475 ml  Net             -465 ml   Filed Weights   05/12/16 0453 05/13/16 0600 05/14/16 0559  Weight: 79.4 kg (175 lb) 79.6 kg (175 lb 6.4 oz) 78.2 kg (172 lb  6.4 oz)    Examination:  General exam: Appears calm and comfortable  Respiratory system: Clear to auscultation. Respiratory effort normal. Cardiovascular system: S1 & S2 heard, RRR. No JVD, murmurs, rubs, gallops or clicks. No pedal edema. Gastrointestinal system: Abdomen is nondistended, soft and nontender. No organomegaly or masses felt. Normal bowel sounds heard. Nephrostomy tube bags both show clear yellow urine Central nervous system: Alert and oriented to self- able to answer questions, somewhat confused as to what has happened during hospitalization. No focal neurological deficits. Extremities: Symmetric 5 x 5 power. Skin: No rashes, lesions or ulcers, bilateral skin thickening of lower extremities bilaterally especially ventral aspect of feet      Data Reviewed: I have personally reviewed following labs and imaging studies  CBC:  Recent Labs Lab 05/08/16 0405 05/09/16 0500 05/13/16 0416 05/14/16 0534  WBC 18.1* 15.5* 12.9* 12.4*  NEUTROABS  --   --  7.8*  --   HGB 8.7* 8.8* 7.5* 7.4*  HCT 24.5* 26.3* 23.3* 23.3*  MCV 83.3 85.7 86.9 87.6  PLT 176 169 279 [id number]   Basic Metabolic Panel:  Recent Labs Lab 05/08/16 0405 05/08/16 1800 05/09/16 0500 05/13/16 0416 05/14/16 0534  NA 144  --  145 146* 144  K 3.0*  --  3.7 3.2* 3.6  CL 121*  --  122* 116* 112*  CO2 18*  --  19* 23 25  GLUCOSE 226*  --  105* 95 108*  BUN 29*  --  22* 8 7  CREATININE 0.71  --  0.52 0.51 0.54  CALCIUM 8.4*  --  8.9 9.1 9.0  MG  --  2.0  --   --   --   PHOS  --  2.2*  --   --   --    GFR: Estimated Creatinine Clearance: 81.1 mL/min (by C-G formula based on SCr of 0.54 mg/dL). Liver Function Tests: No results for input(s): AST, ALT, ALKPHOS, BILITOT, PROT, ALBUMIN in the last 168 hours. No results for input(s): LIPASE, AMYLASE in the last 168 hours. No results for input(s): AMMONIA in the last 168 hours. Coagulation Profile:  Recent Labs Lab 05/13/16 1036  INR 1.18   Cardiac  Enzymes: No results for input(s): CKTOTAL, CKMB, CKMBINDEX, TROPONINI in the last 168 hours. BNP (last 3 results) No results for input(s): PROBNP in the last 8760 hours. HbA1C: No results for input(s): HGBA1C in the last 72 hours. CBG:  Recent Labs Lab 05/13/16 1142 05/13/16 1724 05/13/16 2128 05/14/16 0747 05/14/16 1213  GLUCAP 106* 88 118* 110* 193*   Lipid Profile: No results for input(s): CHOL, HDL, LDLCALC, TRIG, CHOLHDL, LDLDIRECT in the last 72 hours. Thyroid Function Tests: No results for input(s): TSH, T4TOTAL, FREET4, T3FREE,  THYROIDAB in the last 72 hours. Anemia Panel: No results for input(s): VITAMINB12, FOLATE, FERRITIN, TIBC, IRON, RETICCTPCT in the last 72 hours. Sepsis Labs: No results for input(s): PROCALCITON, LATICACIDVEN in the last 168 hours.  Recent Results (from the past 240 hour(s))  Urine culture     Status: Abnormal   Collection Time: 05/04/16 11:21 PM  Result Value Ref Range Status   Specimen Description URINE, RANDOM  Final   Special Requests NONE  Final   Culture 60,000 COLONIES/mL ESCHERICHIA COLI (A)  Final   Report Status 05/07/2016 FINAL  Final   Organism ID, Bacteria ESCHERICHIA COLI (A)  Final      Susceptibility   Escherichia coli - MIC*    AMPICILLIN >=32 RESISTANT Resistant     CEFAZOLIN <=4 SENSITIVE Sensitive     CEFTRIAXONE <=1 SENSITIVE Sensitive     CIPROFLOXACIN >=4 RESISTANT Resistant     GENTAMICIN <=1 SENSITIVE Sensitive     IMIPENEM <=0.25 SENSITIVE Sensitive     NITROFURANTOIN <=16 SENSITIVE Sensitive     TRIMETH/SULFA >=320 RESISTANT Resistant     AMPICILLIN/SULBACTAM >=32 RESISTANT Resistant     PIP/TAZO <=4 SENSITIVE Sensitive     Extended ESBL NEGATIVE Sensitive     * 60,000 COLONIES/mL ESCHERICHIA COLI  Blood Culture (routine x 2)     Status: None   Collection Time: 05/04/16 11:40 PM  Result Value Ref Range Status   Specimen Description BLOOD LEFT ARM  Final   Special Requests IN PEDIATRIC BOTTLE 3ML  Final    Culture NO GROWTH 5 DAYS  Final   Report Status 05/10/2016 FINAL  Final  Blood Culture (routine x 2)     Status: Abnormal   Collection Time: 05/04/16 11:49 PM  Result Value Ref Range Status   Specimen Description BLOOD RIGHT ARM  Final   Special Requests AEROBIC BOTTLE ONLY 5ML  Final   Culture  Setup Time   Final    GRAM POSITIVE COCCI IN CLUSTERS AEROBIC BOTTLE ONLY Organism ID to follow CRITICAL RESULT CALLED TO, READ BACK BY AND VERIFIED WITH: Leatha Gilding.D. 21:25 05/05/16 ([email or web address])    Culture (A)  Final    STAPHYLOCOCCUS SPECIES (COAGULASE NEGATIVE) THE SIGNIFICANCE OF ISOLATING THIS ORGANISM FROM A SINGLE SET OF BLOOD CULTURES WHEN MULTIPLE SETS ARE DRAWN IS UNCERTAIN. PLEASE NOTIFY THE MICROBIOLOGY DEPARTMENT WITHIN ONE WEEK IF SPECIATION AND SENSITIVITIES ARE REQUIRED.    Report Status 05/07/2016 FINAL  Final  Blood Culture ID Panel (Reflexed)     Status: Abnormal   Collection Time: 05/04/16 11:49 PM  Result Value Ref Range Status   Enterococcus species NOT DETECTED NOT DETECTED Final   Listeria monocytogenes NOT DETECTED NOT DETECTED Final   Staphylococcus species DETECTED (A) NOT DETECTED Final    Comment: CRITICAL RESULT CALLED TO, READ BACK BY AND VERIFIED WITH: Leatha Gilding.D. 21:25 05/05/16 ([email or web address])    Staphylococcus aureus NOT DETECTED NOT DETECTED Final   Methicillin resistance DETECTED (A) NOT DETECTED Final    Comment: CRITICAL RESULT CALLED TO, READ BACK BY AND VERIFIED WITH: Leatha Gilding.D. 21:25 05/05/16 ([email or web address])    Streptococcus species NOT DETECTED NOT DETECTED Final   Streptococcus agalactiae NOT DETECTED NOT DETECTED Final   Streptococcus pneumoniae NOT DETECTED NOT DETECTED Final   Streptococcus pyogenes NOT DETECTED NOT DETECTED Final   Acinetobacter baumannii NOT DETECTED NOT DETECTED Final   Enterobacteriaceae species NOT DETECTED NOT DETECTED Final   Enterobacter cloacae complex NOT DETECTED NOT DETECTED Final   Escherichia coli  NOT  DETECTED NOT DETECTED Final   Klebsiella oxytoca NOT DETECTED NOT DETECTED Final   Klebsiella pneumoniae NOT DETECTED NOT DETECTED Final   Proteus species NOT DETECTED NOT DETECTED Final   Serratia marcescens NOT DETECTED NOT DETECTED Final   Haemophilus influenzae NOT DETECTED NOT DETECTED Final   Neisseria meningitidis NOT DETECTED NOT DETECTED Final   Pseudomonas aeruginosa NOT DETECTED NOT DETECTED Final   Candida albicans NOT DETECTED NOT DETECTED Final   Candida glabrata NOT DETECTED NOT DETECTED Final   Candida krusei NOT DETECTED NOT DETECTED Final   Candida parapsilosis NOT DETECTED NOT DETECTED Final   Candida tropicalis NOT DETECTED NOT DETECTED Final  MRSA culture     Status: None   Collection Time: 05/05/16  7:39 AM  Result Value Ref Range Status   Specimen Description NASAL SWAB  Final   Special Requests NONE  Final   Culture NO MRSA DETECTED  Final   Report Status 05/07/2016 FINAL  Final  Culture, respiratory (NON-Expectorated)     Status: None   Collection Time: 05/05/16  4:30 PM  Result Value Ref Range Status   Specimen Description TRACHEAL ASPIRATE  Final   Special Requests NONE  Final   Gram Stain   Final    MODERATE WBC PRESENT,BOTH PMN AND MONONUCLEAR NO ORGANISMS SEEN    Culture Consistent with normal respiratory flora.  Final   Report Status 05/08/2016 FINAL  Final  C difficile quick scan w PCR reflex     Status: None   Collection Time: 05/05/16  4:50 PM  Result Value Ref Range Status   C Diff antigen NEGATIVE NEGATIVE Final   C Diff toxin NEGATIVE NEGATIVE Final   C Diff interpretation No C. difficile detected.  Final         Radiology Studies: No results found.      Scheduled Meds: . sodium chloride  10 mL/hr Intravenous Once  . cefTRIAXone (ROCEPHIN)  IV  1 g Intravenous Q24H  . famotidine  20 mg Oral BID  . feeding supplement (ENSURE ENLIVE)  237 mL Oral BID BM  . insulin aspart  0-15 Units Subcutaneous TID WC  . insulin aspart  0-5  Units Subcutaneous QHS  . potassium chloride  30 mEq Oral BID  . sodium chloride  1,000 mL Intravenous Once   Continuous Infusions: . sodium chloride 10 mL/hr at 05/09/16 1900     LOS: 9 days    Time spent: 35 minutes    Loretha Stapler, MD [facility] Pager [phone]  If 7PM-7AM, please contact night-coverage [email or web address] Password [facility] 05/14/2016, 3:43 PM

## 2016-05-15 DIAGNOSIS — N132 Hydronephrosis with renal and ureteral calculous obstruction: Secondary | ICD-10-CM

## 2016-05-15 DIAGNOSIS — T83098A Other mechanical complication of other indwelling urethral catheter, initial encounter: Secondary | ICD-10-CM

## 2016-05-15 DIAGNOSIS — D62 Acute posthemorrhagic anemia: Secondary | ICD-10-CM

## 2016-05-15 LAB — CBC
HEMATOCRIT: 23.2 % — AB (ref 36.0–46.0)
HEMOGLOBIN: 7.5 g/dL — AB (ref 12.0–15.0)
MCH: 28.4 pg (ref 26.0–34.0)
MCHC: 32.3 g/dL (ref 30.0–36.0)
MCV: 87.9 fL (ref 78.0–100.0)
Platelets: 310 10*3/uL (ref 150–400)
RBC: 2.64 MIL/uL — AB (ref 3.87–5.11)
RDW: 17.5 % — ABNORMAL HIGH (ref 11.5–15.5)
WBC: 12 10*3/uL — ABNORMAL HIGH (ref 4.0–10.5)

## 2016-05-15 LAB — GLUCOSE, CAPILLARY
Glucose-Capillary: 100 mg/dL — ABNORMAL HIGH (ref 65–99)
Glucose-Capillary: 170 mg/dL — ABNORMAL HIGH (ref 65–99)
Glucose-Capillary: 167 mg/dL — ABNORMAL HIGH (ref 65–99)
Glucose-Capillary: 129 mg/dL — ABNORMAL HIGH (ref 65–99)

## 2016-05-15 MED ORDER — LISINOPRIL 5 MG PO TABS
2.5000 mg | ORAL_TABLET | Freq: Every day | ORAL | Status: DC
  Administered 2016-05-16 – 2016-05-17 (×2): 2.5 mg via ORAL
  Filled 2016-05-15 (×2): qty 1

## 2016-05-15 MED ORDER — ATORVASTATIN CALCIUM 10 MG PO TABS
10.0000 mg | ORAL_TABLET | Freq: Every day | ORAL | Status: DC
  Administered 2016-05-15 – 2016-05-16 (×2): 10 mg via ORAL
  Filled 2016-05-15 (×2): qty 1

## 2016-05-15 MED ORDER — GABAPENTIN 100 MG PO CAPS
100.0000 mg | ORAL_CAPSULE | Freq: Every day | ORAL | Status: DC
  Administered 2016-05-15 – 2016-05-16 (×2): 100 mg via ORAL
  Filled 2016-05-15 (×2): qty 1

## 2016-05-15 MED ORDER — RIVAROXABAN 20 MG PO TABS
20.0000 mg | ORAL_TABLET | Freq: Every day | ORAL | Status: DC
  Administered 2016-05-15: 20 mg via ORAL
  Filled 2016-05-15 (×2): qty 1

## 2016-05-15 NOTE — Progress Notes (Signed)
PROGRESS NOTE    [name]  [id number] DOB: July 20, 1960 DOA: 05/04/2016 PCP: Blanchie Serve, MD    Brief Narrative:  55 year old female with hx MS, CKD III, DM, GERD, HTN, nephrolithiasis, sacral decub, and neuromuscular disorder of bladder. She has a chronic indwelling nephrostomy tube and bilateral hydronephrosis due to obstructing urinary calculi. Recent admission for septic shock from urinary source at which time she was evaluated by urology, cystoscopy did not reveal any evidence of fistula. Plan was for her to undergo nephrolithotomy on 11/30 (Dr Diona Fanti). She was discharged to [facility] with 2 weeks of antibiotics via PICC line, which was discontinued on 11/21.  She was brought in by EMS on 11/22 with altered mental status in the form of lethargy for one day and no urine output. Frank pus was noted both in the Foley and nephrostomy tube, her Foley was coated wfeces, she was hypotensive and sepsis protocol was initiated. She was intubated for airway protection, given Zosyn and vancomycin. Initial hemoglobin was 4 but on repeat this was noted to be 11. PCCM admitted pt and pt extubated on 11/26, [facility] assumed care 11/28.  Urology consulted.  Patient underwent PCN tube placement on 12/1 as well as changing of left nephrostomy tube on 12/1.   Assessment & Plan:   Active Problems:   Septic shock (HCC)   Acute respiratory failure with hypoxia (HCC)   Acute respiratory failure Intubated for airway protection, extubated 11/26 - resolved - currently pt maintaining oxygen saturations at target range    Septic shock, sepsis secondary to pelvic infection, cystitis, E. Coli UTI - Coag neg staph 1 of 2 blood cx likely contaminant  - Ceftriaxone as above as sensitivity report indicate resistance to multiple PO ABX - Defer rx for coag neg staph for now - will d/c antibiotics today - Urology consulted   AKI, metabolic acidosis  - Obstructive hydronephrosis/ pyonephrosis s/p left  nephrostomy tube  - per PCCM, plan to consult urology this admission, suspect that this illness will delay plans for lithotomy  - Cr is now WNL    UGIB, anemia of chronic disease  - Stool occult blood positive - no further evidence of bleeding  - H/H stable - will restart xarelto - will need to repeat CBC in am  Hypokalemia - supplemented with PO KCl - will need repeat BMP in am  Chronic DVT, last study 02/2015 - plan to restart xarelto prior to d/c home - follow CBC to insure Hgb stable  At risk for relative adrenal insufficiency  - Stress dose steroids >stop 11/27  Multiple sclerosis  - stable for now  DVT prophylaxis: SCD's,resume xarelto now  Code Status: Full  Family Communication: Patient at bedside  Disposition Plan: anticipate discharge in next 24-48 hours pending stability of H/H     Consultants:   PCCM  Urology  Procedures:   Intubation  Antimicrobials:  ? 11/22 zosyn>>11/24 ? 11/22 vanc >>11/24  ceftriaxone 11/24>>>    Subjective: Patient denies any pain today.  She is sleepy.  She is hopeful that she can discharge soon back to [facility].  Discussed restarting Xarelto today; patient agreeable to this.  Eating well.   Objective: Vitals:   05/14/16 0559 05/14/16 1344 05/14/16 2259 05/15/16 0730  BP: 130/68 (!) 103/58 (!) 143/61 (!) 133/59  Pulse: 70 91 74 74  Resp: 17 18 17 18   Temp: 98.7 F (37.1 C) 98.6 F (37 C) 98.7 F (37.1 C) 98.7 F (37.1 C)  TempSrc: Oral Oral Oral Oral  SpO2: 100% 100% 100% 100%  Weight: 78.2 kg (172 lb 6.4 oz)   71 kg (156 lb 8 oz)  Height:        Intake/Output Summary (Last 24 hours) at 05/15/16 1512 Last data filed at 05/15/16 0730  Gross per 24 hour  Intake              120 ml  Output             2250 ml  Net            -2130 ml   Filed Weights   05/13/16 0600 05/14/16 0559 05/15/16 0730  Weight: 79.6 kg (175 lb 6.4 oz) 78.2 kg (172 lb 6.4 oz) 71 kg (156 lb 8 oz)    Examination:  General  exam: Appears calm and comfortable  Respiratory system: Clear to auscultation. Respiratory effort normal. Cardiovascular system: S1 & S2 heard, RRR. No JVD, murmurs, rubs, gallops or clicks. No pedal edema. Gastrointestinal system: Abdomen is nondistended, soft and nontender. No organomegaly or masses felt. Normal bowel sounds heard. Nephrostomy tube bags filled with clear yellow urine, foley bag with pink tinged urine Central nervous system: Alert and oriented to self- able to answer questions, pleasant. No focal neurological deficits. Extremities: able to move all 4 extremities spontaneously Skin: No rashes, lesions or ulcers, bilateral skin thickening of lower extremities bilaterally especially ventral aspect of feet      Data Reviewed: I have personally reviewed following labs and imaging studies  CBC:  Recent Labs Lab 05/09/16 0500 05/13/16 0416 05/14/16 0534 05/15/16 0252  WBC 15.5* 12.9* 12.4* 12.0*  NEUTROABS  --  7.8*  --   --   HGB 8.8* 7.5* 7.4* 7.5*  HCT 26.3* 23.3* 23.3* 23.2*  MCV 85.7 86.9 87.6 87.9  PLT 169 279 327 [id number]   Basic Metabolic Panel:  Recent Labs Lab 05/08/16 1800 05/09/16 0500 05/13/16 0416 05/14/16 0534  NA  --  145 146* 144  K  --  3.7 3.2* 3.6  CL  --  122* 116* 112*  CO2  --  19* 23 25  GLUCOSE  --  105* 95 108*  BUN  --  22* 8 7  CREATININE  --  0.52 0.51 0.54  CALCIUM  --  8.9 9.1 9.0  MG 2.0  --   --   --   PHOS 2.2*  --   --   --    GFR: Estimated Creatinine Clearance: 77.5 mL/min (by C-G formula based on SCr of 0.54 mg/dL). Liver Function Tests: No results for input(s): AST, ALT, ALKPHOS, BILITOT, PROT, ALBUMIN in the last 168 hours. No results for input(s): LIPASE, AMYLASE in the last 168 hours. No results for input(s): AMMONIA in the last 168 hours. Coagulation Profile:  Recent Labs Lab 05/13/16 1036  INR 1.18   Cardiac Enzymes: No results for input(s): CKTOTAL, CKMB, CKMBINDEX, TROPONINI in the last 168 hours. BNP  (last 3 results) No results for input(s): PROBNP in the last 8760 hours. HbA1C: No results for input(s): HGBA1C in the last 72 hours. CBG:  Recent Labs Lab 05/14/16 1213 05/14/16 1658 05/14/16 2256 05/15/16 0802 05/15/16 1324  GLUCAP 193* 150* 131* 100* 170*   Lipid Profile: No results for input(s): CHOL, HDL, LDLCALC, TRIG, CHOLHDL, LDLDIRECT in the last 72 hours. Thyroid Function Tests: No results for input(s): TSH, T4TOTAL, FREET4, T3FREE, THYROIDAB in the last 72 hours. Anemia Panel: No results for input(s): VITAMINB12, FOLATE,  FERRITIN, TIBC, IRON, RETICCTPCT in the last 72 hours. Sepsis Labs: No results for input(s): PROCALCITON, LATICACIDVEN in the last 168 hours.  Recent Results (from the past 240 hour(s))  Culture, respiratory (NON-Expectorated)     Status: None   Collection Time: 05/05/16  4:30 PM  Result Value Ref Range Status   Specimen Description TRACHEAL ASPIRATE  Final   Special Requests NONE  Final   Gram Stain   Final    MODERATE WBC PRESENT,BOTH PMN AND MONONUCLEAR NO ORGANISMS SEEN    Culture Consistent with normal respiratory flora.  Final   Report Status 05/08/2016 FINAL  Final  C difficile quick scan w PCR reflex     Status: None   Collection Time: 05/05/16  4:50 PM  Result Value Ref Range Status   C Diff antigen NEGATIVE NEGATIVE Final   C Diff toxin NEGATIVE NEGATIVE Final   C Diff interpretation No C. difficile detected.  Final         Radiology Studies: No results found.      Scheduled Meds: . sodium chloride  10 mL/hr Intravenous Once  . cefTRIAXone (ROCEPHIN)  IV  1 g Intravenous Q24H  . famotidine  20 mg Oral BID  . feeding supplement (ENSURE ENLIVE)  237 mL Oral BID BM  . heparin subcutaneous  5,000 Units Subcutaneous Q8H  . insulin aspart  0-15 Units Subcutaneous TID WC  . insulin aspart  0-5 Units Subcutaneous QHS  . potassium chloride  30 mEq Oral BID  . sodium chloride  1,000 mL Intravenous Once   Continuous  Infusions: . sodium chloride 10 mL/hr at 05/09/16 1900     LOS: 10 days    Time spent: 35 minutes    Loretha Stapler, MD [facility] Pager [phone]  If 7PM-7AM, please contact night-coverage [email or web address] Password [email or web address] 05/15/2016, 3:12 PM

## 2016-05-15 NOTE — Clinical Social Work Note (Signed)
Clinical Social Worker continuing to follow patient and family for support and discharge planning needs.  Per MD note, awaiting medical clearance from Urology.  Patient is a long term resident at [facility] and plans to return at discharge once medically stable.  CSW remains available for support and to facilitate patient discharge needs.  Barbette Or, LCSW (Weekend Coverage) [phone]

## 2016-05-16 ENCOUNTER — Encounter (HOSPITAL_COMMUNITY): Payer: Self-pay | Admitting: Interventional Radiology

## 2016-05-16 DIAGNOSIS — N322 Vesical fistula, not elsewhere classified: Secondary | ICD-10-CM

## 2016-05-16 DIAGNOSIS — R310 Gross hematuria: Secondary | ICD-10-CM

## 2016-05-16 DIAGNOSIS — N1330 Unspecified hydronephrosis: Secondary | ICD-10-CM

## 2016-05-16 LAB — CBC
HEMATOCRIT: 25.6 % — AB (ref 36.0–46.0)
HEMOGLOBIN: 8.2 g/dL — AB (ref 12.0–15.0)
MCH: 28.8 pg (ref 26.0–34.0)
MCHC: 32 g/dL (ref 30.0–36.0)
MCV: 89.8 fL (ref 78.0–100.0)
Platelets: 421 10*3/uL — ABNORMAL HIGH (ref 150–400)
RBC: 2.85 MIL/uL — AB (ref 3.87–5.11)
RDW: 17.6 % — ABNORMAL HIGH (ref 11.5–15.5)
WBC: 9.2 10*3/uL (ref 4.0–10.5)

## 2016-05-16 LAB — CBC WITH DIFFERENTIAL/PLATELET
Basophils Absolute: 0 10*3/uL (ref 0.0–0.1)
Basophils Relative: 0 %
Eosinophils Absolute: 0.2 10*3/uL (ref 0.0–0.7)
Eosinophils Relative: 2 %
HEMATOCRIT: 25 % — AB (ref 36.0–46.0)
Hemoglobin: 8 g/dL — ABNORMAL LOW (ref 12.0–15.0)
LYMPHS ABS: 3.6 10*3/uL (ref 0.7–4.0)
LYMPHS PCT: 35 %
MCH: 28.5 pg (ref 26.0–34.0)
MCHC: 32 g/dL (ref 30.0–36.0)
MCV: 89 fL (ref 78.0–100.0)
MONOS PCT: 8 %
Monocytes Absolute: 0.9 10*3/uL (ref 0.1–1.0)
NEUTROS ABS: 5.8 10*3/uL (ref 1.7–7.7)
NEUTROS PCT: 55 %
Platelets: 421 10*3/uL — ABNORMAL HIGH (ref 150–400)
RBC: 2.81 MIL/uL — AB (ref 3.87–5.11)
RDW: 17 % — ABNORMAL HIGH (ref 11.5–15.5)
WBC: 10.5 10*3/uL (ref 4.0–10.5)

## 2016-05-16 LAB — GLUCOSE, CAPILLARY
GLUCOSE-CAPILLARY: 91 mg/dL (ref 65–99)
GLUCOSE-CAPILLARY: 198 mg/dL — AB (ref 65–99)
GLUCOSE-CAPILLARY: 113 mg/dL — AB (ref 65–99)
Glucose-Capillary: 152 mg/dL — ABNORMAL HIGH (ref 65–99)
Glucose-Capillary: 130 mg/dL — ABNORMAL HIGH (ref 65–99)

## 2016-05-16 LAB — BASIC METABOLIC PANEL
Anion gap: 5 (ref 5–15)
BUN: 5 mg/dL — AB (ref 6–20)
CO2: 28 mmol/L (ref 22–32)
CREATININE: 0.53 mg/dL (ref 0.44–1.00)
Calcium: 9.2 mg/dL (ref 8.9–10.3)
Chloride: 105 mmol/L (ref 101–111)
GFR calc non Af Amer: >60 mL/min (ref >60)
Glucose, Bld: 87 mg/dL (ref 65–99)
POTASSIUM: 4.2 mmol/L (ref 3.5–5.1)
Sodium: 138 mmol/L (ref 135–145)

## 2016-05-16 MED ORDER — SODIUM CHLORIDE 0.9 % IR SOLN: 100.0000 mL | Freq: Two times a day (BID) | Status: DC

## 2016-05-16 NOTE — Discharge Summary (Signed)
Physician Discharge Summary  [name] [id number] DOB: 1961-01-23 DOA: 05/04/2016  PCP: Blanchie Serve, MD  Admit date: 05/04/2016 Discharge date: 05/17/2016  Admitted From: SNF  Disposition:  SNF   Recommendations for Outpatient Follow-up:  1. Follow up with PCP in 1-2 weeks 2. Dr. Alan Ripper office will set up follow up with you 3. Please obtain BMP/CBC in two days 4. Bladder irrigation with saline BID 5. Interventional radiology will follow up with you in 4-6 weeks  Home Health: NoEquipment/Devices: Chronic Indwelling foley  Discharge Condition: Stable  CODE STATUS: Full Code Diet recommendation: Heart Healthy    Brief/Interim Summary: 55 year old female with hx MS, CKD III, DM, GERD, HTN, nephrolithiasis, sacral decub, and neuromuscular disorder of bladder. She has a chronic indwelling nephrostomy tube and bilateral hydronephrosis due to obstructing urinary calculi. Recent admission for septic shock from urinary source at which time she was evaluated by urology, cystoscopy did not reveal any evidence of fistula. Plan was for her to undergo nephrolithotomy on 11/30 (Dr Diona Fanti). She was discharged to [facility] with 2 weeks of antibiotics via PICC line, which was discontinued on 11/21.  She was brought in by EMS on 11/22 with altered mental status in the form of lethargy for one day and no urine output. Frank pus was noted both in the Foley and nephrostomy tube, her Foley was coated wfeces, she was hypotensive and sepsis protocol was initiated. She was intubated for airway protection, given Zosyn and vancomycin. Initial hemoglobin was 4 but on repeat this was noted to be 11. PCCM admitted pt and pt extubated on 11/26, TR[facility] assumed care 11/28.  Urology consulted.  Patient underwent PCN tube placement on 12/1 as well as changing of left nephrostomy tube on 12/1.  Discharge Diagnoses:  Active Problems:   Septic shock (HCC)   Acute respiratory failure with hypoxia (HCC)    Hydronephrosis  Complication GU system with obstructing nephrolithiasis - underwent bilateral PCN tube placement - will need tubes to be kept in place  - management outpatient per Dr. Zannie Cove - per Dr. Zannie Cove a slight pink/ bloody tinge to urine is to be expected - monitor for urine color - recheck H/H in 2 days - Urology office will get in touch with you to set up follow up - Interventional Radiology will get in touch with you to set up follow up in 4-6 weeks  Acute respiratory failure Intubated for airway protection, extubated 11/26 - resolved - currently pt maintaining oxygen saturations at target range    Septic shock, sepsis secondary to pelvic infection, cystitis, E. Coli UTI - Coag neg staph 1 of 2 blood cx likely contaminant  - Ceftriaxone as above as sensitivity report indicate resistance to multiple PO ABX - Defer rx for coag neg staph for now - antibiotics d/c'ed two days prior to discharge - Urology consulted - BID bladder irrigation ordered  AKI, metabolic acidosis  - Obstructive hydronephrosis/ pyonephrosis s/p left nephrostomy tube - per PCCM, plan to consult urology this admission, suspect that this illness will delay plans for lithotomy  - Cr is now WNL  - discussed with Dr. Diona Fanti- patient will need to follow up with him outpatient and will need to keep nephrostomy tubes in place in interim he does not believe she needs prophylactic antibiotics   UGIB, anemia of chronic disease  - Stool occult blood positive - no further evidence of bleeding  - H/H stable - will restart xarelto  Hypokalemia - supplemented with PO KCl -  potassium WNL today  Chronic DVT, last study 02/2015 -plan to restart xarelto prior to d/c home - follow CBC to insure Hgb stable - Nephrostomy bag on the right shows pink tinged urine and blood in tubing- will continue to monitor H/H  At risk for relative adrenal insufficiency  - Stress dose steroids >stop  11/27  Multiple sclerosis  - stable for now  Discharge Instructions  Discharge Instructions    Activity as tolerated - No restrictions    Complete by:  As directed    Activity as tolerated - No restrictions    Complete by:  As directed    Call MD for:  difficulty breathing, headache or visual disturbances    Complete by:  As directed    Call MD for:  difficulty breathing, headache or visual disturbances    Complete by:  As directed    Call MD for:  extreme fatigue    Complete by:  As directed    Call MD for:  extreme fatigue    Complete by:  As directed    Call MD for:  hives    Complete by:  As directed    Call MD for:  persistant dizziness or light-headedness    Complete by:  As directed    Call MD for:  persistant dizziness or light-headedness    Complete by:  As directed    Call MD for:  persistant nausea and vomiting    Complete by:  As directed    Call MD for:  persistant nausea and vomiting    Complete by:  As directed    Call MD for:  redness, tenderness, or signs of infection (pain, swelling, redness, odor or green/yellow discharge around incision site)    Complete by:  As directed    Call MD for:  severe uncontrolled pain    Complete by:  As directed    Call MD for:  severe uncontrolled pain    Complete by:  As directed    Call MD for:  temperature >100.4    Complete by:  As directed    Call MD for:  temperature >100.4    Complete by:  As directed    Diet - low sodium heart healthy    Complete by:  As directed    Diet - low sodium heart healthy    Complete by:  As directed    Discharge instructions    Complete by:  As directed    Bladder irrigation with saline BID       Medication List    STOP taking these medications   baclofen 10 MG tablet Commonly known as:  LIORESAL   senna-docusate 8.6-50 MG tablet Commonly known as:  Senokot-S   solifenacin 5 MG tablet Commonly known as:  VESICARE     TAKE these medications   acetaminophen 325 MG  tablet Commonly known as:  TYLENOL Take 2 tablets (650 mg total) by mouth every 6 (six) hours as needed for fever.   albuterol (2.5 MG/3ML) 0.083% nebulizer solution Commonly known as:  PROVENTIL Take 3 mLs (2.5 mg total) by nebulization every 6 (six) hours as needed for wheezing or shortness of breath.   atorvastatin 10 MG tablet Commonly known as:  LIPITOR Take 10 mg by mouth at bedtime.   famotidine 20 MG tablet Commonly known as:  PEPCID Take 1 tablet (20 mg total) by mouth 2 (two) times daily.   feeding supplement (PRO-STAT SUGAR FREE 64) Liqd Take 30 mLs by  mouth 2 (two) times daily.   furosemide 20 MG tablet Commonly known as:  LASIX Take 40 mg by mouth 2 (two) times daily.   gabapentin 100 MG capsule Commonly known as:  NEURONTIN Take 1 capsule (100 mg total) by mouth at bedtime.   glipiZIDE 5 MG tablet Commonly known as:  GLUCOTROL Take 5 mg by mouth daily before breakfast.   lisinopril 2.5 MG tablet Commonly known as:  PRINIVIL,ZESTRIL Take 2.5 mg by mouth daily.   metFORMIN 500 MG tablet Commonly known as:  GLUCOPHAGE Take 0.5 tablets (250 mg total) by mouth 2 (two) times daily.   multivitamin with minerals Tabs tablet Take 1 tablet by mouth daily.   omega-3 acid ethyl esters 1 g capsule Commonly known as:  LOVAZA Take 1 g by mouth daily.   polyethylene glycol packet Commonly known as:  MIRALAX / GLYCOLAX Take 17 g by mouth 2 (two) times daily.   potassium chloride 10 MEQ tablet Commonly known as:  K-DUR,KLOR-CON Take 1 tablet (10 mEq total) by mouth daily.   rivaroxaban 20 MG Tabs tablet Commonly known as:  XARELTO Take 1 tablet (20 mg total) by mouth daily with supper. Reported on 08/12/2015      Follow-up Information    DAHLSTEDT, Lillette Boxer, MD Follow up.   Specialty:  Urology Why:  Office will call to make an appointment. If you have not heard from the office call within 1 week to schedule follow up Contact information: [place] [address] [phone]        Blanchie Serve, MD. Schedule an appointment as soon as possible for a visit in 2 week(s).   Specialty:  Internal Medicine Contact information: [place] [address] [phone]          No Known Allergies  Consultations:  Urology  Interventional Radiology  PT/OT  PCCM   Procedures/Studies: Ct Abdomen Pelvis W Contrast  Result Date: 04/20/2016 CLINICAL DATA:  Followup pelvic abscess. EXAM: CT ABDOMEN AND PELVIS WITH CONTRAST TECHNIQUE: Multidetector CT imaging of the abdomen and pelvis was performed using the standard protocol following bolus administration of intravenous contrast. CONTRAST:  165mL ISOVUE-300 IOPAMIDOL (ISOVUE-300) INJECTION 61% COMPARISON:  03/19/2016 FINDINGS: Lower chest: The lung bases are clear. The heart is mildly enlarged but stable. Small amount of pericardial fluid. The distal esophagus is grossly normal. Hepatobiliary: Small scattered low-attenuation liver lesions are stable and likely benign cysts. The gallbladder is normal. No common bile duct dilatation. Pancreas: No mass, inflammation or ductal dilatation. Spleen: Normal size.  No focal lesions. Adrenals/Urinary Tract: Externalized left-sided nephrostomy tube is still in place. Stable large calculus measuring 14 mm. The renal pelvis demonstrates hazy attenuation/enhancement which could be chronic inflammatory tissue surrounding the calculus and the catheter. No discrete tumors identified. Stable large parapelvic and parenchymal renal cysts. The right kidney is grossly normal. Minimal scarring changes. No hydronephrosis. Stable large, 12.5 mm, distal left ureteral calculus. A Foley catheter is noted in the bladder. The right ureter is unremarkable. Stomach/Bowel: The stomach, duodenum bulb and C-loop are normal. The small bowel demonstrates mild proximal dilatation with scattered air-fluid levels and mild wall thickening and enhancement of the  mucosal folds. Findings suggest enteritis. The distal loops of small bowel/ ileum are normal. Moderate stool throughout the colon is noted. No mass or inflammation. A rectal tube is in place. The terminal ileum is normal. The appendix is normal. Vascular/Lymphatic: Stable atherosclerotic calcifications involving aorta and branch vessels. The major  venous structures are patent. Stable scattered retroperitoneal lymph nodes but no mass or overt adenopathy. Reproductive: Stable enlarged fibroid uterus. The ovaries are normal. Other: No pelvic mass or adenopathy. The inflammatory phlegmon in the right pelvis surrounding the colon has completely resolved. Musculoskeletal: No significant bony findings. IMPRESSION: 1. Stable left-sided nephrostomy tube and large calculus in the renal pelvis. There appears to be some chronic inflammatory process in the renal pelvis with contrast enhancement. 2. Stable 12.5 mm distal left ureteral calculus. 3. Foley catheter in a decompressed bladder with slight irregular wall thickening. 4. Resolution of the right lower quadrant/right-sided pelvic inflammatory process. 5. Dilated and thick walled proximal small bowel/jejunum could be due to enteritis. The ileum and colon are unremarkable except for constipation. 6. Small scattered low-attenuation liver lesions are stable and likely benign cysts. The Electronically Signed   By: Marijo Sanes M.D.   On: 04/20/2016 09:30   Dg Chest Port 1 View  Result Date: 05/07/2016 CLINICAL DATA:  55 year old female with respiratory failure EXAM: PORTABLE CHEST 1 VIEW COMPARISON:  Prior chest x-ray 05/05/2016 FINDINGS: The patient remains intubated. The tip of the endotracheal tube is 6.3 cm above the carina. A gastric tube is present. The tip of the gastric tube projects over the distal esophagus. This has significantly pulled back compared to prior. Left IJ approach central venous catheter with the catheter tip overlying the cavoatrial junction.  Stable cardiac and mediastinal contours. Persistent low inspiratory volumes with nonspecific bibasilar opacities. Probable small left layering pleural effusion. Patient is rotated toward the left on today's exam. IMPRESSION: 1. The nasogastric tube has significantly pulled back. The tip now overlies the distal esophagus. If the an tennis for gastric decompression, recommend advancing this tube approximately 10 cm. 2. Other support apparatus in stable and satisfactory position. 3. Persistent low inspiratory volumes with nonspecific bibasilar opacities favored to reflect atelectasis. 4. Suspect small left pleural effusion. Electronically Signed   By: Jacqulynn Cadet M.D.   On: 05/07/2016 08:18   Dg Chest Portable 1 View  Result Date: 05/05/2016 CLINICAL DATA:  Central line placement EXAM: PORTABLE CHEST 1 VIEW COMPARISON:  05/04/2016 at 2359 hours FINDINGS: Endotracheal tube tip measures 4.1 cm above the carina. A left central venous catheter has been placed with tip over the low SVC region. No pneumothorax. Enteric tube tip is off the field of view but below the left hemidiaphragm. Heart size and pulmonary vascularity are normal. Shallow inspiration with atelectasis in the left lung base. No blunting of costophrenic angles. No pneumothorax. Mediastinal contours appear intact. IMPRESSION: Appliances appear in satisfactory position. Atelectasis in the left lung base. Electronically Signed   By: Lucienne Capers M.D.   On: 05/05/2016 03:21   Dg Chest Port 1 View  Result Date: 05/05/2016 CLINICAL DATA:  Endotracheal tube adjustment EXAM: PORTABLE CHEST 1 VIEW COMPARISON:  05/04/2016 FINDINGS: Endotracheal tube has been pulled back but tip is still low, measuring 1.3 cm above the carina. Consider withdrawing the tube another 1-2 cm. Enteric tube is unchanged in position. Shallow inspiration. Normal heart size and pulmonary vascularity. Lungs are clear. IMPRESSION: Endotracheal tube has been pulled back of the  right mainstem bronchus but remains low, measuring only about 1.3 cm above the carina. Consider withdrawing the tube another 1-2 cm. Electronically Signed   By: Lucienne Capers M.D.   On: 05/05/2016 00:30   Dg Chest Port 1 View  Result Date: 05/05/2016 CLINICAL DATA:  Endotracheal tube placement EXAM: PORTABLE CHEST 1 VIEW COMPARISON:  03/19/2016  FINDINGS: Endotracheal tube has been placed with tip only about 3 mm above the carina and directed to the mainstem bronchus on the right. A subsequent image has been obtained after repositioning of the tube. Enteric tube tip is off the field of view but below the left hemidiaphragm. Shallow inspiration. Heart size and pulmonary vascularity are normal. Lungs are clear. IMPRESSION: Endotracheal tube tip is at the carina directed towards the right mainstem bronchus. Shallow inspiration. Electronically Signed   By: Lucienne Capers M.D.   On: 05/05/2016 00:27   Ct Renal Stone Study  Result Date: 05/05/2016 CLINICAL DATA:  Acute onset of septic shock.  Initial encounter. EXAM: CT ABDOMEN AND PELVIS WITHOUT CONTRAST TECHNIQUE: Multidetector CT imaging of the abdomen and pelvis was performed following the standard protocol without IV contrast. COMPARISON:  CT of the abdomen and pelvis from 04/19/2016 FINDINGS: Lower chest: Mild bibasilar atelectasis is noted. Trace pericardial fluid likely remains within normal limits. Hepatobiliary: The liver is unremarkable in appearance. The gallbladder is unremarkable in appearance. The common bile duct remains normal in caliber. Pancreas: The pancreas is within normal limits. Spleen: The spleen is unremarkable in appearance. Adrenals/Urinary Tract: The adrenal glands are unremarkable in appearance. The kidneys are within normal limits. There is no evidence of hydronephrosis. No renal or ureteral stones are identified. No perinephric stranding is seen. Mild to moderate bilateral hydronephrosis is noted. A left-sided nephrostomy tube  is noted. Two large left renal stones measure up to 1.9 cm in size. Nonspecific perinephric stranding is noted bilaterally. A large obstructing left ureteral stone is noted, measuring 1.9 x 1.0 cm, 2 cm above the left renal pelvis. Stomach/Bowel: The patient's enteric tube is noted ending at the antrum of the stomach. The stomach is grossly unremarkable in appearance. The small bowel is unremarkable. The appendix is normal in caliber, without evidence of appendicitis. The colon is partially filled with stool. There is mild wall thickening along the proximal sigmoid colon, with diffuse soft tissue inflammation noted about the lower abdomen and pelvis, tracking about small and large bowel loops. Complex fluid tracks about the bladder and uterus. Vascular/Lymphatic: Scattered calcification is seen along the abdominal aorta and its branches. No definite retroperitoneal lymphadenopathy is seen. Reproductive: The bladder is diffusely thick walled, with a Foley catheter in place. Given the extent of surrounding soft tissue inflammation, findings are concerning for relatively severe cystitis. Multiple small uterine fibroids are noted. The ovaries are not well characterized. No suspicious adnexal masses are seen. Other: No additional soft tissue abnormalities are seen. Musculoskeletal: No acute osseous abnormalities are identified. The visualized musculature is unremarkable in appearance. IMPRESSION: 1. Diffuse bladder wall thickening, with complex fluid tracking about the bladder and uterus, but no defined abscess at this time. Diffuse soft tissue inflammation extends about the lower abdomen and pelvis. This is concerning for diffuse pelvic infection and cystitis. 2. Mild to moderate bilateral hydronephrosis, with a left-sided nephrostomy tube. Large obstructing left ureteral stone measures 1.9 x 1.0 cm, 2 cm above the left renal pelvis. Right-sided hydronephrosis appears to reflect the underlying bladder process. 3.  Nonobstructing left renal stones measure up to 1.9 cm in size. 4. Mild wall thickening about the proximal sigmoid colon. This may be reactive secondary to diffuse pelvic inflammation. 5. Multiple small uterine fibroids noted. 6. Mild bibasilar atelectasis noted. 7. Scattered aortic atherosclerosis. Electronically Signed   By: Garald Balding M.D.   On: 05/05/2016 01:04   Ir Nephrostomy Placement Right  Result Date: 05/16/2016 INDICATION: 55 year old  female with a history of bilateral ureteral obstruction. She has had a prior percutaneous nephrostomy placed on the left, and she presents for exchange. She has developed a right-sided ureteral obstruction. EXAM: IR NEPHROURETERAL CATH PLACEMENT RIGHT; IR EXCHANGE NEPHROSTOMY LEFT COMPARISON:  None. MEDICATIONS: 1 g Rocephin; The antibiotic was administered in an appropriate time frame prior to skin puncture. ANESTHESIA/SEDATION: Fentanyl 3.0 mcg IV; Versed 150 mg IV Moderate Sedation Time:  20 The patient was continuously monitored during the procedure by the interventional radiology nurse under my direct supervision. CONTRAST:  20 cc - administered into the collecting system(s) FLUOROSCOPY TIME:  Fluoroscopy Time: 1 minutes 42 seconds (3 mGy). COMPLICATIONS: None PROCEDURE: Informed written consent was obtained from the patient after a thorough discussion of the procedural risks, benefits and alternatives. All questions were addressed. Maximal Sterile Barrier Technique was utilized including caps, mask, sterile gowns, sterile gloves, sterile drape, hand hygiene and skin antiseptic. A timeout was performed prior to the initiation of the procedure. Patient positioned prone position on the fluoroscopy table. Small amount of contrast was then injected through the left indwelling percutaneous nephrostomy confirming position. Catheter was ligated. Using modified Seldinger technique, the indwelling 10 French drain was exchanged for a new 10 [place] drain. Final image was  stored. Lidocaine was used for local anesthesia and a catheter was sutured in position. Ultrasound survey of the right flank was then performed with images stored and sent to PACs. The patient was then prepped and draped in the usual sterile fashion. 1% lidocaine was used to anesthetize the skin and subcutaneous tissues for local anesthesia. A Chiba needle was then used to access a posterior inferior calyx with ultrasound guidance. With spontaneous urine returned through the needle, passage of an 018 micro wire into the collecting system was performed under fluoroscopy. A small incision was made with an 11 blade scalpel, and the needle was removed from the wire. An Accustick system was then advanced over the wire into the collecting system under fluoroscopy. The metal stiffener and inner dilator were removed, and then a sample of fluid was aspirated through the 4 French outer sheath. Bentson wire was passed into the collecting system and the sheath removed. Ten French dilation of the soft tissues was performed. Using modified Seldinger technique, a 10 French pigtail catheter drain was placed over the Bentson wire. Wire and inner stiffener removed, and the pigtail was formed in the collecting system. Small amount of contrast confirmed position of the catheter. Patient tolerated the procedure well and remained hemodynamically stable throughout. No complications were encountered and no significant blood loss encountered IMPRESSION: Status post image guided exchange of nonfunctioning left percutaneous nephrostomy tube as well as placement of a new 10 French pigtail drainage catheter into the right collecting system to treat obstruction of the right ureter. Signed, Dulcy Fanny. Earleen Newport, DO [facility] Electronically Signed   By: Corrie Mckusick D.O.   On: 05/16/2016 08:56   Ir Nephrostomy Exchange Left  Result Date: 05/16/2016 INDICATION: 55 year old female with a  history of bilateral ureteral obstruction. She has had a prior percutaneous nephrostomy placed on the left, and she presents for exchange. She has developed a right-sided ureteral obstruction. EXAM: IR NEPHROURETERAL CATH PLACEMENT RIGHT; IR EXCHANGE NEPHROSTOMY LEFT COMPARISON:  None. MEDICATIONS: 1 g Rocephin; The antibiotic was administered in an appropriate time frame prior to skin puncture. ANESTHESIA/SEDATION: Fentanyl 3.0 mcg IV; Versed 150 mg IV Moderate Sedation Time:  20 The patient was continuously monitored during  the procedure by the interventional radiology nurse under my direct supervision. CONTRAST:  20 cc - administered into the collecting system(s) FLUOROSCOPY TIME:  Fluoroscopy Time: 1 minutes 42 seconds (3 mGy). COMPLICATIONS: None PROCEDURE: Informed written consent was obtained from the patient after a thorough discussion of the procedural risks, benefits and alternatives. All questions were addressed. Maximal Sterile Barrier Technique was utilized including caps, mask, sterile gowns, sterile gloves, sterile drape, hand hygiene and skin antiseptic. A timeout was performed prior to the initiation of the procedure. Patient positioned prone position on the fluoroscopy table. Small amount of contrast was then injected through the left indwelling percutaneous nephrostomy confirming position. Catheter was ligated. Using modified Seldinger technique, the indwelling 10 French drain was exchanged for a new 10 [place] drain. Final image was stored. Lidocaine was used for local anesthesia and a catheter was sutured in position. Ultrasound survey of the right flank was then performed with images stored and sent to PACs. The patient was then prepped and draped in the usual sterile fashion. 1% lidocaine was used to anesthetize the skin and subcutaneous tissues for local anesthesia. A Chiba needle was then used to access a posterior inferior calyx with ultrasound guidance. With spontaneous urine returned  through the needle, passage of an 018 micro wire into the collecting system was performed under fluoroscopy. A small incision was made with an 11 blade scalpel, and the needle was removed from the wire. An Accustick system was then advanced over the wire into the collecting system under fluoroscopy. The metal stiffener and inner dilator were removed, and then a sample of fluid was aspirated through the 4 French outer sheath. Bentson wire was passed into the collecting system and the sheath removed. Ten French dilation of the soft tissues was performed. Using modified Seldinger technique, a 10 French pigtail catheter drain was placed over the Bentson wire. Wire and inner stiffener removed, and the pigtail was formed in the collecting system. Small amount of contrast confirmed position of the catheter. Patient tolerated the procedure well and remained hemodynamically stable throughout. No complications were encountered and no significant blood loss encountered IMPRESSION: Status post image guided exchange of nonfunctioning left percutaneous nephrostomy tube as well as placement of a new 10 French pigtail drainage catheter into the right collecting system to treat obstruction of the right ureter. Signed, Dulcy Fanny. Earleen Newport, DO [facility] Radiology Electronically Signed   By: Corrie Mckusick D.O.   On: 05/16/2016 08:56   Ir Nephrostomy Exchange Left  Result Date: 04/19/2016 CLINICAL DATA:  Left nephrolithiasis. Percutaneous nephrostomy catheter placed 12/25/2015, exchanged 02/23/2016. Patient returns for scheduled exchange. EXAM: LEFT PERCUTANEOUS NEPHROSTOMY CATHETER EXCHANGE UNDER FLUOROSCOPY FLUOROSCOPY TIME:  0.9 minute, 282 uGym2 DAP TECHNIQUE: The procedure, risks (including but not limited to bleeding, infection, organ damage ), benefits, and alternatives were explained to the patient. Questions regarding the procedure were encouraged and answered. The patient  understands and consents to the procedure. The nephrostomy tube and surrounding skin were prepped with Betadine, draped in usual sterile fashion. A small amount of contrast was injected through the left nephrostomy catheter to opacify the renal collecting system. The catheter had pulled back into an upper pole calyx. The catheter was cut and exchanged over a 0.035" angiographic wire for a new 10-French pigtail catheter, formed centrally within the collecting system under fluoroscopy. Contrast injection confirms appropriate positioning. Catheter secured externally with 0 Prolene suture. The patient tolerated the procedure well. COMPLICATIONS: None. IMPRESSION: 1. Technically successful exchange  of left nephrostomy catheter under fluoroscopy Electronically Signed   By: Lucrezia Europe M.D.   On: 04/19/2016 15:13       Subjective: Patient seen and examined. She is still having some mild discomfort at nephrostomy tube sites.  No dizziness or lightheaded.  Says she feels ready to discharge to SNF today.  Discharge Exam: Vitals:   05/16/16 2012 05/17/16 0640  BP: (!) 112/56 (!) 113/57  Pulse: 85 86  Resp: 17 18  Temp: 99 F (37.2 C) 98.3 F (36.8 C)   Vitals:   05/16/16 1155 05/16/16 1300 05/16/16 2012 05/17/16 0640  BP: (!) 113/54 (!) 115/50 (!) 112/56 (!) 113/57  Pulse: 85 88 85 86  Resp:   17 18  Temp: 99 F (37.2 C) 98.9 F (37.2 C) 99 F (37.2 C) 98.3 F (36.8 C)  TempSrc: Oral Oral Oral Oral  SpO2: 100% 100% 98% 99%  Weight:      Height:        General: Pt is alert, awake, not in acute distress Cardiovascular: RRR, S1/S2 +, no rubs, no gallops Respiratory: CTA bilaterally, no wheezing, no rhonchi Abdominal: Soft, NT, ND, bowel sounds + Extremities: no edema, no cyanosis    The results of significant diagnostics from this hospitalization (including imaging, microbiology, ancillary and laboratory) are listed below for reference.     Microbiology: No results found for this or  any previous visit (from the past 240 hour(s)).   Labs: BNP (last 3 results)  Recent Labs  12/18/15 1035  BNP [id number]   Basic Metabolic Panel:  Recent Labs Lab 05/13/16 0416 05/14/16 0534 05/16/16 0458  NA 146* 144 138  K 3.2* 3.6 4.2  CL 116* 112* 105  CO2 23 25 28   GLUCOSE 95 108* 87  BUN 8 7 5*  CREATININE 0.51 0.54 0.53  CALCIUM 9.1 9.0 9.2   Liver Function Tests: No results for input(s): AST, ALT, ALKPHOS, BILITOT, PROT, ALBUMIN in the last 168 hours. No results for input(s): LIPASE, AMYLASE in the last 168 hours. No results for input(s): AMMONIA in the last 168 hours. CBC:  Recent Labs Lab 05/13/16 0416 05/14/16 0534 05/15/16 0252 05/16/16 0458 05/16/16 1553 05/17/16 0608  WBC 12.9* 12.4* 12.0* 10.5 9.2  --   NEUTROABS 7.8*  --   --  5.8  --   --   HGB 7.5* 7.4* 7.5* 8.0* 8.2* 8.4*  HCT 23.3* 23.3* 23.2* 25.0* 25.6* 26.2*  MCV 86.9 87.6 87.9 89.0 89.8  --   PLT 279 327 310 421* 421*  --    Cardiac Enzymes: No results for input(s): CKTOTAL, CKMB, CKMBINDEX, TROPONINI in the last 168 hours. BNP: Invalid input(s): POCBNP CBG:  Recent Labs Lab 05/16/16 1526 05/16/16 1843 05/16/16 2119 05/17/16 0747 05/17/16 1206  GLUCAP 198* 113* 130* 104* 177*   D-Dimer No results for input(s): DDIMER in the last 72 hours. Hgb A1c No results for input(s): HGBA1C in the last 72 hours. Lipid Profile No results for input(s): CHOL, HDL, LDLCALC, TRIG, CHOLHDL, LDLDIRECT in the last 72 hours. Thyroid function studies No results for input(s): TSH, T4TOTAL, T3FREE, THYROIDAB in the last 72 hours.  Invalid input(s): FREET3 Anemia work up No results for input(s): VITAMINB12, FOLATE, FERRITIN, TIBC, IRON, RETICCTPCT in the last 72 hours. Urinalysis    Component Value Date/Time   COLORURINE STRAW (A) 05/04/2016 2321   APPEARANCEUR TURBID (A) 05/04/2016 2321   LABSPEC 1.013 05/04/2016 2321   PHURINE 6.0 05/04/2016 2321   GLUCOSEU NEGATIVE  05/04/2016 2321   HGBUR  LARGE (A) 05/04/2016 2321   BILIRUBINUR NEGATIVE 05/04/2016 2321   KETONESUR 15 (A) 05/04/2016 2321   PROTEINUR 100 (A) 05/04/2016 2321   UROBILINOGEN 0.2 12/20/2014 1851   NITRITE NEGATIVE 05/04/2016 2321   LEUKOCYTESUR LARGE (A) 05/04/2016 2321   Sepsis Labs Invalid input(s): PROCALCITONIN,  WBC,  LACTICIDVEN Microbiology No results found for this or any previous visit (from the past 240 hour(s)).   Time coordinating discharge: Over 30 minutes  SIGNED:   Loretha Stapler, MD  [facility] 05/17/2016, 1:33 PM Pager [phone] If 7PM-7AM, please contact night-coverage [email or web address] Password [email or web address]

## 2016-05-16 NOTE — Discharge Instructions (Signed)
Information on my medicine - XARELTO (rivaroxaban)  This medication education was reviewed with me or my healthcare representative as part of my discharge preparation.  The pharmacist that spoke with me during my hospital stay was:  Brain Hilts, [place]? Xarelto was prescribed to treat blood clots that may have been found in the veins of your legs (deep vein thrombosis) or in your lungs (pulmonary embolism) and to reduce the risk of them occurring again.  What do you need to know about Xarelto? The dose is 20 mg tablet taken ONCE A DAY with your evening meal.  DO NOT stop taking Xarelto without talking to the health care provider who prescribed the medication.  Refill your prescription for 20 mg tablets before you run out.  After discharge, you should have regular check-up appointments with your healthcare provider that is prescribing your Xarelto.  In the future your dose may need to be changed if your kidney function changes by a significant amount.  What do you do if you miss a dose? If you are taking Xarelto ONCE DAILY and you miss a dose, take it as soon as you remember on the same day then continue your regularly scheduled once daily regimen the next day. Do not take two doses of Xarelto at the same time.   Important Safety Information Xarelto is a blood thinner medicine that can cause bleeding. You should call your healthcare provider right away if you experience any of the following: ? Bleeding from an injury or your nose that does not stop. ? Unusual colored urine (red or dark brown) or unusual colored stools (red or black). ? Unusual bruising for unknown reasons. ? A serious fall or if you hit your head (even if there is no bleeding).  Some medicines may interact with Xarelto and might increase your risk of bleeding while on Xarelto. To help avoid this, consult your healthcare provider or pharmacist prior to using any new  prescription or non-prescription medications, including herbals, vitamins, non-steroidal anti-inflammatory drugs (NSAIDs) and supplements.  This website has more information on Xarelto: [email or web address]

## 2016-05-16 NOTE — Progress Notes (Signed)
Nutrition Follow-up  DOCUMENTATION CODES:   Not applicable  INTERVENTION:   -Continue Ensure Enlive po BID, each supplement provides 350 kcal and 20 grams of protein  NUTRITION DIAGNOSIS:   Increased nutrient needs related to wound healing as evidenced by estimated needs.  Progressing  GOAL:   Patient will meet greater than or equal to 90% of their needs  Progressing  MONITOR:   PO intake, Supplement acceptance, Diet advancement, I & O's, Skin  REASON FOR ASSESSMENT:   Ventilator    ASSESSMENT:   55 y/o female PMHx MS, CKD3, DM, GERD,  CVA, HTN, nephrolithiasis, sacral decub. Presented w/ AMS, lethargy, and 1 day with no urine output. Worked up for Septic Shock. Intubated for airway protection.  11/27- transferred to surgical floor 1/7- s/p lt PCN exchange and rt PCN insertion  Spoke with pt at bedside, who reports fair appetite. Pt consuming Ensure supplement at time of visit. Noted pt consumed 75% of breakfast tray (all items except oatmeal). Pt would like to continue with Ensure, stating she enjoys the supplements and was consuming PTA. Meal completion variable; PO: 25075%.   Per RN documentation, wounds have epithelialized.   Labs reviewed: CBGS: 91-167.   Diet Order:  Diet Carb Modified Fluid consistency: Thin; Room service appropriate? Yes Diet - low sodium heart healthy  Skin:  Wound (see comment) (st I lt buttocks, st II sacrum both epithelialized)  Last BM:  05/15/16  Height:   Ht Readings from Last 1 Encounters:  05/09/16 5' 5.5" (1.664 m)    Weight:   Wt Readings from Last 1 Encounters:  05/15/16 156 lb 8 oz (71 kg)    Ideal Body Weight:  59.1 kg  BMI:  Body mass index is 25.65 kg/m.  Estimated Nutritional Needs:   Kcal:  1700-1900  Protein:  95-110 grams  Fluid:  >1.7 L/day  EDUCATION NEEDS:   No education needs identified at this time  Jayana Kotula A. Jimmye Norman, RD, LDN, CDE Pager: [phone] After hours Pager: [phone]

## 2016-05-16 NOTE — Progress Notes (Signed)
PROGRESS NOTE    [name]  [id number] DOB: 11-Sep-1960 DOA: 05/04/2016 PCP: Blanchie Serve, MD    Brief Narrative:  55 year old female with hx MS, CKD III, DM, GERD, HTN, nephrolithiasis, sacral decub, and neuromuscular disorder of bladder. She has a chronic indwelling nephrostomy tube and bilateral hydronephrosis due to obstructing urinary calculi. Recent admission for septic shock from urinary source at which time she was evaluated by urology, cystoscopy did not reveal any evidence of fistula. Plan was for her to undergo nephrolithotomy on 11/30 (Dr Diona Fanti). She was discharged to [facility] with 2 weeks of antibiotics via PICC line, which was discontinued on 11/21.  She was brought in by EMS on 11/22 with altered mental status in the form of lethargy for one day and no urine output. Frank pus was noted both in the Foley and nephrostomy tube, her Foley was coated wfeces, she was hypotensive and sepsis protocol was initiated. She was intubated for airway protection, given Zosyn and vancomycin. Initial hemoglobin was 4 but on repeat this was noted to be 11. PCCM admitted pt and pt extubated on 11/26, [facility] assumed care 11/28.  Urology consulted.  Patient underwent PCN tube placement on 12/1 as well as changing of left nephrostomy tube on 12/1.   Assessment & Plan:   Active Problems:   Septic shock (HCC)   Respiratory failure (HCC)   Hydronephrosis   Acute respiratory failure Intubated for airway protection, extubated 11/26 - resolved - currently pt maintaining oxygen saturations at target range    Septic shock, sepsis secondary to pelvic infection, cystitis, E. Coli UTI - Coag neg staph 1 of 2 blood cx likely contaminant  - Ceftriaxone as above as sensitivity report indicate resistance to multiple PO ABX - Defer rx for coag neg staph for now - antibiotics d/c'ed yesterdady - Urology consulted - BID bladder irrigation ordered   AKI, metabolic acidosis  - Obstructive  hydronephrosis/ pyonephrosis s/p left nephrostomy tube  - per PCCM, plan to consult urology this admission, suspect that this illness will delay plans for lithotomy  - Cr is now WNL  - discussed with Dr. Diona Fanti- patient will need to follow up with him outpatient and will need to keep nephrostomy tubes in place in interim he does not believe she needs prophylactic antibiotics   UGIB, anemia of chronic disease  - Stool occult blood positive - no further evidence of bleeding  - H/H stable - will restart xarelto  Hypokalemia - supplemented with PO KCl - potassium WNL today  Chronic DVT, last study 02/2015 - plan to restart xarelto prior to d/c home - follow CBC to insure Hgb stable - Nephrostomy bag on the right shows pink tinged urine and blood in tubing- will continue to monitor H/H  At risk for relative adrenal insufficiency  - Stress dose steroids >stop 11/27  Multiple sclerosis  - stable for now  DVT prophylaxis: SCD's,xarelto Code Status: Full  Family Communication: Patient at bedside  Disposition Plan: anticipate discharge in next 24 hours pending stability of H/H     Consultants:   PCCM  Urology  Procedures:   Intubation  Antimicrobials:  ? 11/22 zosyn>>11/24 ? 11/22 vanc >>11/24  ceftriaxone 11/24>>>    Subjective: Patient right nephrostomy tube has blood in line and pink tinged urine (changed from yesterday).  Timed CBC ordered.  Patient denies any new symptoms or dizziness or lightheadedness, just has pain at nephrostomy tube sites.   Objective: Vitals:   05/15/16 2235 05/16/16  0526 05/16/16 1155 05/16/16 1300  BP: 140/70 (!) 119/52 (!) 113/54 (!) 115/50  Pulse: 84 72 85 88  Resp:      Temp: 99.4 F (37.4 C) 98.9 F (37.2 C) 99 F (37.2 C) 98.9 F (37.2 C)  TempSrc: Oral Oral Oral Oral  SpO2: 100% 100% 100% 100%  Weight:      Height:        Intake/Output Summary (Last 24 hours) at 05/16/16 1752 Last data filed at 05/16/16 1700   Gross per 24 hour  Intake              660 ml  Output             4491 ml  Net            -3831 ml   Filed Weights   05/13/16 0600 05/14/16 0559 05/15/16 0730  Weight: 79.6 kg (175 lb 6.4 oz) 78.2 kg (172 lb 6.4 oz) 71 kg (156 lb 8 oz)    Examination:  General exam: Appears calm and comfortable  Respiratory system: Clear to auscultation. Respiratory effort normal. Cardiovascular system: S1 & S2 heard, RRR. No JVD, murmurs, rubs, gallops or clicks. No pedal edema. Gastrointestinal system: Abdomen is nondistended, soft and nontender. No organomegaly or masses felt. Normal bowel sounds heard. Right nephrostomy tube bags filled with pink tinged urine and blood visible in bag, clear yellow urine in left nephrostomy tubing, foley bag with minimal output Central nervous system: Alert and oriented to self- able to answer questions, pleasant. No focal neurological deficits. Extremities: able to move all 4 extremities spontaneously Skin: No rashes, lesions or ulcers, bilateral skin thickening of lower extremities bilaterally especially ventral aspect of feet      Data Reviewed: I have personally reviewed following labs and imaging studies  CBC:  Recent Labs Lab 05/13/16 0416 05/14/16 0534 05/15/16 0252 05/16/16 0458 05/16/16 1553  WBC 12.9* 12.4* 12.0* 10.5 9.2  NEUTROABS 7.8*  --   --  5.8  --   HGB 7.5* 7.4* 7.5* 8.0* 8.2*  HCT 23.3* 23.3* 23.2* 25.0* 25.6*  MCV 86.9 87.6 87.9 89.0 89.8  PLT 279 327 310 421* [id number]*   Basic Metabolic Panel:  Recent Labs Lab 05/13/16 0416 05/14/16 0534 05/16/16 0458  NA 146* 144 138  K 3.2* 3.6 4.2  CL 116* 112* 105  CO2 23 25 28   GLUCOSE 95 108* 87  BUN 8 7 5*  CREATININE 0.51 0.54 0.53  CALCIUM 9.1 9.0 9.2   GFR: Estimated Creatinine Clearance: 77.5 mL/min (by C-G formula based on SCr of 0.53 mg/dL). Liver Function Tests: No results for input(s): AST, ALT, ALKPHOS, BILITOT, PROT, ALBUMIN in the last 168 hours. No results for  input(s): LIPASE, AMYLASE in the last 168 hours. No results for input(s): AMMONIA in the last 168 hours. Coagulation Profile:  Recent Labs Lab 05/13/16 1036  INR 1.18   Cardiac Enzymes: No results for input(s): CKTOTAL, CKMB, CKMBINDEX, TROPONINI in the last 168 hours. BNP (last 3 results) No results for input(s): PROBNP in the last 8760 hours. HbA1C: No results for input(s): HGBA1C in the last 72 hours. CBG:  Recent Labs Lab 05/15/16 1727 05/15/16 2234 05/16/16 0810 05/16/16 1153 05/16/16 1526  GLUCAP 167* 129* 91 152* 198*   Lipid Profile: No results for input(s): CHOL, HDL, LDLCALC, TRIG, CHOLHDL, LDLDIRECT in the last 72 hours. Thyroid Function Tests: No results for input(s): TSH, T4TOTAL, FREET4, T3FREE, THYROIDAB in the last 72 hours. Anemia Panel: No  results for input(s): VITAMINB12, FOLATE, FERRITIN, TIBC, IRON, RETICCTPCT in the last 72 hours. Sepsis Labs: No results for input(s): PROCALCITON, LATICACIDVEN in the last 168 hours.  No results found for this or any previous visit (from the past 240 hour(s)).       Radiology Studies: No results found.      Scheduled Meds: . sodium chloride  10 mL/hr Intravenous Once  . atorvastatin  10 mg Oral QHS  . famotidine  20 mg Oral BID  . feeding supplement (ENSURE ENLIVE)  237 mL Oral BID BM  . gabapentin  100 mg Oral QHS  . insulin aspart  0-15 Units Subcutaneous TID WC  . insulin aspart  0-5 Units Subcutaneous QHS  . lisinopril  2.5 mg Oral Daily  . potassium chloride  30 mEq Oral BID  . rivaroxaban  20 mg Oral Q supper  . sodium chloride  1,000 mL Intravenous Once   Continuous Infusions: . sodium chloride 10 mL/hr at 05/09/16 1900     LOS: 11 days    Time spent: 35 minutes    Loretha Stapler, MD [facility] Pager [phone]  If 7PM-7AM, please contact night-coverage [email or web address] Password [email or web address] 05/16/2016, 5:52 PM

## 2016-05-17 ENCOUNTER — Encounter (HOSPITAL_COMMUNITY): Payer: Self-pay

## 2016-05-17 LAB — HEMOGLOBIN AND HEMATOCRIT, BLOOD
HEMATOCRIT: 26.2 % — AB (ref 36.0–46.0)
Hemoglobin: 8.4 g/dL — ABNORMAL LOW (ref 12.0–15.0)

## 2016-05-17 LAB — GLUCOSE, CAPILLARY
GLUCOSE-CAPILLARY: 104 mg/dL — AB (ref 65–99)
Glucose-Capillary: 177 mg/dL — ABNORMAL HIGH (ref 65–99)

## 2016-05-17 NOTE — Progress Notes (Signed)
Pt for d/c back to [facility] today via ambulance. Pt Ox4 and agreeable to return to [place] today. DC summary sent to facility via EP[facility]. SW spoke with Rollene Fare at [facility] who confirmed pt is able to return today. RN to call report. [facility] arranged for transport. CSW signing off at d/c.   Wandra Feinstein, MSW, LCSW [phone] (coverage)

## 2016-05-17 NOTE — Care Management Important Message (Signed)
Important Message  Patient Details  Name: [name] MRN: [id number] Date of Birth: February 04, 1961   Medicare Important Message Given:  Yes    [name] 05/17/2016, 11:08 AM

## 2016-05-17 NOTE — Progress Notes (Signed)
Patient discharged back to [facility], reported to nurse Orlene Och.

## 2016-05-19 ENCOUNTER — Non-Acute Institutional Stay (SKILLED_NURSING_FACILITY): Payer: Medicare Other | Admitting: Internal Medicine

## 2016-05-19 ENCOUNTER — Encounter: Payer: Self-pay | Admitting: Internal Medicine

## 2016-05-19 DIAGNOSIS — I10 Essential (primary) hypertension: Secondary | ICD-10-CM | POA: Diagnosis not present

## 2016-05-19 DIAGNOSIS — N183 Chronic kidney disease, stage 3 unspecified: Secondary | ICD-10-CM

## 2016-05-19 DIAGNOSIS — K5909 Other constipation: Secondary | ICD-10-CM

## 2016-05-19 DIAGNOSIS — I82402 Acute embolism and thrombosis of unspecified deep veins of left lower extremity: Secondary | ICD-10-CM

## 2016-05-19 DIAGNOSIS — E118 Type 2 diabetes mellitus with unspecified complications: Secondary | ICD-10-CM

## 2016-05-19 DIAGNOSIS — K219 Gastro-esophageal reflux disease without esophagitis: Secondary | ICD-10-CM | POA: Diagnosis not present

## 2016-05-19 DIAGNOSIS — E440 Moderate protein-calorie malnutrition: Secondary | ICD-10-CM | POA: Diagnosis not present

## 2016-05-19 DIAGNOSIS — N200 Calculus of kidney: Secondary | ICD-10-CM

## 2016-05-19 DIAGNOSIS — N319 Neuromuscular dysfunction of bladder, unspecified: Secondary | ICD-10-CM

## 2016-05-19 DIAGNOSIS — N390 Urinary tract infection, site not specified: Secondary | ICD-10-CM | POA: Diagnosis not present

## 2016-05-19 DIAGNOSIS — D638 Anemia in other chronic diseases classified elsewhere: Secondary | ICD-10-CM | POA: Diagnosis not present

## 2016-05-19 DIAGNOSIS — N1330 Unspecified hydronephrosis: Secondary | ICD-10-CM | POA: Diagnosis not present

## 2016-05-19 DIAGNOSIS — B9620 Unspecified Escherichia coli [E. coli] as the cause of diseases classified elsewhere: Secondary | ICD-10-CM

## 2016-05-19 DIAGNOSIS — I890 Lymphedema, not elsewhere classified: Secondary | ICD-10-CM | POA: Diagnosis not present

## 2016-05-19 DIAGNOSIS — R531 Weakness: Secondary | ICD-10-CM | POA: Diagnosis not present

## 2016-05-19 NOTE — Progress Notes (Signed)
LOCATION: [facility]  PCP: Blanchie Serve, MD   Code Status: Full Code  Goals of care: Advanced Directive information Advanced Directives 05/04/2016  Does Patient Have a Medical Advance Directive? No  Type of Advance Directive Healthcare Power of Attorney  Does patient want to make changes to medical advance directive? No - Patient declined  Copy of [place] in Chart? No - copy requested  Would patient like information on creating a medical advance directive? -  Pre-existing out of facility DNR order (yellow form or pink MOST form) -       Extended Emergency Contact Information Primary Emergency Contact: [facility] Address: [address]          [place], [place] [address] Johnnette Litter of [place] Phone: [phone] Mobile Phone: [phone] Relation: Relative Secondary Emergency Contact: Hughes,Michael Address: [address]          [place], [place] [address] Johnnette Litter of [place] Phone: [phone] Mobile Phone: [phone] Relation: Relative   No Known Allergies  Chief Complaint  Patient presents with  . Readmit To SNF    Readmission Visit     HPI:  Patient is a 55 y.o. female seen today for long term care post hospital admission from 05/04/16-05/17/16 with altered mental state with decreased urine output. She had urosepsis and pelvic inflammatory process with E.coli and required iv antibiotics. She required intubation for respiratory failure. She had IR guided bilateral placement of nephrostomy tube and will eventually need left PCNL. She has medical history of multiple sclerosis, decubitus ulcer, ckd, CVA among others. She is seen in her room today.   Review of Systems:  Constitutional: Negative for fever, chills, diaphoresis. Energy level is slowly coming back. HENT: Negative for headache, congestion, nasal discharge, sore throat, difficulty swallowing.   Eyes: Negative for blurred vision, double vision and  discharge.  Respiratory: Negative for cough, shortness of breath and wheezing.   Cardiovascular: Negative for chest pain, palpitations, leg swelling.  Gastrointestinal: Negative for heartburn, nausea, vomiting, abdominal pain. Last bowel movement was yesterday. Genitourinary: she has foley catheter and nephrostomy tubes. Musculoskeletal: Negative for fall in the facility.  Skin: Negative for itching, rash.  Neurological: Negative for dizziness. Psychiatric/Behavioral: Negative for depression.   Past Medical History:  Diagnosis Date  . Acute renal failure superimposed on stage 3 chronic kidney disease ([place]) 07/21/2015  . Anemia    chronic   . Diabetes mellitus without complication ([place])   . Diabetic neuropathy ([place])   . DVT (deep venous thrombosis) ([place])   . DVT (deep venous thrombosis) ([place])   . GERD (gastroesophageal reflux disease)   . Hx of sepsis   . Hypertension   . Left nephrolithiasis   . Leukocytosis   . Lymph edema    chronic   . Malnutrition of moderate degree 07/21/2015  . Multiple sclerosis diagnosed 2002-not on Therapy any longer 06/26/2012  . Muscle weakness (generalized)   . Neuromuscular disorder ([place])   . Neuromuscular dysfunction of bladder   . Polyneuropathy ([place])   . Presence of indwelling urinary catheter   . Protein calorie malnutrition ([place])   . Stage 4 decubitus ulcer ([place]) 07/05/2015  . Stroke ([place])   . UTI (lower urinary tract infection)    Past Surgical History:  Procedure Laterality Date  . ESOPHAGOGASTRODUODENOSCOPY Left 01/02/2015   Procedure: ESOPHAGOGASTRODUODENOSCOPY (EGD);  Surgeon: Arta Silence, MD;  Location: [facility];  Service: Endoscopy;  Laterality: Left;  . HERNIA MESH REMOVAL    .  IR GENERIC HISTORICAL  02/23/2016   IR NEPHROSTOMY EXCHANGE LEFT 02/23/2016 Sandi Mariscal, MD [facility]  . IR GENERIC HISTORICAL  04/19/2016   IR NEPHROSTOMY EXCHANGE LEFT 04/19/2016 Arne Cleveland, MD [facility]  . IR GENERIC HISTORICAL  05/13/2016    IR NEPHROSTOMY EXCHANGE LEFT 05/13/2016 Corrie Mckusick, DO [facility]  . IR GENERIC HISTORICAL  05/13/2016   IR NEPHROSTOMY PLACEMENT RIGHT 05/13/2016 Corrie Mckusick, DO [facility]  . NEPHROSTOMY TUBE PLACEMENT ([place] [facility])    . TRANSURETHRAL RESECTION OF BLADDER TUMOR N/A 03/16/2016   Procedure: CYSTOSCOPY BLADDER BIOPSY;  Surgeon: Franchot Gallo, MD;  Location: [facility];  Service: Urology;  Laterality: N/A;  . TUBAL LIGATION    . tubes tided     Social History:   reports that she quit smoking about 17 months ago. Her smoking use included Cigarettes. She has a 20.00 pack-year smoking history. She has never used smokeless tobacco. She reports that she drinks about 3.0 oz of alcohol per week . She reports that she does not use drugs.  Family History  Problem Relation Age of Onset  . Diabetes Mother   . Alzheimer's disease Mother   . Diabetes Father   . Diabetes Sister   . Scoliosis Sister     Medications:   Medication List       Accurate as of 05/19/16  1:13 PM. Always use your most recent med list.          acetaminophen 325 MG tablet Commonly known as:  TYLENOL Take 2 tablets (650 mg total) by mouth every 6 (six) hours as needed for fever.   albuterol (2.5 MG/3ML) 0.083% nebulizer solution Commonly known as:  PROVENTIL Take 3 mLs (2.5 mg total) by nebulization every 6 (six) hours as needed for wheezing or shortness of breath.   atorvastatin 10 MG tablet Commonly known as:  LIPITOR Take 10 mg by mouth at bedtime.   famotidine 20 MG tablet Commonly known as:  PEPCID Take 1 tablet (20 mg total) by mouth 2 (two) times daily.   feeding supplement (PRO-STAT SUGAR FREE 64) Liqd Take 30 mLs by mouth 2 (two) times daily.   furosemide 20 MG tablet Commonly known as:  LASIX Take 40 mg by mouth 2 (two) times daily.   gabapentin 100 MG capsule Commonly known as:  NEURONTIN Take 1 capsule (100 mg total) by mouth at bedtime.   glipiZIDE 5 MG tablet Commonly known as:   GLUCOTROL Take 5 mg by mouth daily before breakfast.   lisinopril 2.5 MG tablet Commonly known as:  PRINIVIL,ZESTRIL Take 2.5 mg by mouth daily.   metFORMIN 500 MG tablet Commonly known as:  GLUCOPHAGE Take 0.5 tablets (250 mg total) by mouth 2 (two) times daily.   multivitamin with minerals Tabs tablet Take 1 tablet by mouth daily.   omega-3 acid ethyl esters 1 g capsule Commonly known as:  LOVAZA Take 1 g by mouth daily.   polyethylene glycol packet Commonly known as:  MIRALAX / GLYCOLAX Take 17 g by mouth 2 (two) times daily.   potassium chloride 10 MEQ tablet Commonly known as:  K-DUR,KLOR-CON Take 1 tablet (10 mEq total) by mouth daily.   rivaroxaban 20 MG Tabs tablet Commonly known as:  XARELTO Take 1 tablet (20 mg total) by mouth daily with supper. Reported on 08/12/2015       Immunizations: Immunization History  Administered Date(s) Administered  . Influenza Split 06/26/2012  . Influenza-Unspecified 04/02/2015  . PPD Test 01/04/2015, 07/09/2015, 07/23/2015,  09/02/2015, 12/31/2015  . Pneumococcal-Unspecified 03/13/2014     Physical Exam:  Vitals:   05/19/16 1305  BP: 126/70  Pulse: 70  Resp: 18  Temp: 98.9 F (37.2 C)  TempSrc: Oral  Weight: 156 lb (70.8 kg)  Height: 5\' 6"  (1.676 m)   Body mass index is 25.18 kg/m.  General- adult female, well built, in no acute distress Head- normocephalic, atraumatic Nose- no nasal discharge Throat- moist mucus membrane  Eyes- PERRLA, EOMI, no pallor, no icterus, no discharge Neck- no cervical lymphadenopathy Cardiovascular- normal s1,s2, no murmur Respiratory- bilateral clear to auscultation, no wheeze, no rhonchi, no crackles, no use of accessory muscles Abdomen- bowel sounds present, soft, non tender, foley catheter present, bilateral nephrostomy tube site clean and draining well Musculoskeletal- generalized weakness most prominent in her LE and RUE Neurological- alert and oriented to person, place and  time Skin- warm and dry, chronic skin changes to her lower legs noted Psychiatry- normal mood and affect    Labs reviewed: Basic Metabolic Panel:  Recent Labs  05/06/16 0500 05/07/16 0455 05/07/16 1428  05/08/16 1800  05/13/16 0416 05/14/16 0534 05/16/16 0458  NA 143 142 141  < >  --   < > 146* 144 138  K 2.6* 2.3* 3.3*  < >  --   < > 3.2* 3.6 4.2  CL 117* 117* 119*  < >  --   < > 116* 112* 105  CO2 15* 17* 14*  < >  --   < > 23 25 28   GLUCOSE 154* 141* 180*  < >  --   < > 95 108* 87  BUN 36* 25* 25*  < >  --   < > 8 7 5*  CREATININE 1.15* 0.83 0.86  < >  --   < > 0.51 0.54 0.53  CALCIUM 8.5* 8.3* 8.1*  < >  --   < > 9.1 9.0 9.2  MG 1.4* 1.5* 2.2  --  2.0  --   --   --   --   PHOS 1.9* 2.2*  --   --  2.2*  --   --   --   --   < > = values in this interval not displayed. Liver Function Tests:  Recent Labs  12/19/15 0744  03/08/16 1240 03/24/16 05/05/16 0032  AST 12*  < > 21 10* 37  ALT 7*  < > 13* 11 17  ALKPHOS 50  < > 40 66 37*  BILITOT 0.5  --  0.6  --  1.3*  PROT 6.6  --  7.7  --  5.3*  ALBUMIN 2.3*  --  3.2*  --  2.2*  < > = values in this interval not displayed. No results for input(s): LIPASE, AMYLASE in the last 8760 hours.  Recent Labs  07/20/15 1712  AMMONIA 21   CBC:  Recent Labs  05/05/16 0032  05/13/16 0416  05/15/16 0252 05/16/16 0458 05/16/16 1553 05/17/16 0608  WBC 8.8  < > 12.9*  < > 12.0* 10.5 9.2  --   NEUTROABS 7.8*  --  7.8*  --   --  5.8  --   --   HGB 11.4*  < > 7.5*  < > 7.5* 8.0* 8.2* 8.4*  HCT 34.8*  < > 23.3*  < > 23.2* 25.0* 25.6* 26.2*  MCV 89.5  < > 86.9  < > 87.9 89.0 89.8  --   PLT 258  < > 279  < >  310 421* 421*  --   < > = values in this interval not displayed. Cardiac Enzymes:  Recent Labs  03/09/16 0305 05/05/16 0830 05/05/16 1450  TROPONINI <0.03 0.17* 0.18*   BNP: Invalid input(s): POCBNP CBG:  Recent Labs  05/16/16 2119 05/17/16 0747 05/17/16 1206  GLUCAP 130* 104* 177*    Radiological  Exams: Ct Abdomen Pelvis W Contrast  Result Date: 04/20/2016 CLINICAL DATA:  Followup pelvic abscess. EXAM: CT ABDOMEN AND PELVIS WITH CONTRAST TECHNIQUE: Multidetector CT imaging of the abdomen and pelvis was performed using the standard protocol following bolus administration of intravenous contrast. CONTRAST:  141mL ISOVUE-300 IOPAMIDOL (ISOVUE-300) INJECTION 61% COMPARISON:  03/19/2016 FINDINGS: Lower chest: The lung bases are clear. The heart is mildly enlarged but stable. Small amount of pericardial fluid. The distal esophagus is grossly normal. Hepatobiliary: Small scattered low-attenuation liver lesions are stable and likely benign cysts. The gallbladder is normal. No common bile duct dilatation. Pancreas: No mass, inflammation or ductal dilatation. Spleen: Normal size.  No focal lesions. Adrenals/Urinary Tract: Externalized left-sided nephrostomy tube is still in place. Stable large calculus measuring 14 mm. The renal pelvis demonstrates hazy attenuation/enhancement which could be chronic inflammatory tissue surrounding the calculus and the catheter. No discrete tumors identified. Stable large parapelvic and parenchymal renal cysts. The right kidney is grossly normal. Minimal scarring changes. No hydronephrosis. Stable large, 12.5 mm, distal left ureteral calculus. A Foley catheter is noted in the bladder. The right ureter is unremarkable. Stomach/Bowel: The stomach, duodenum bulb and C-loop are normal. The small bowel demonstrates mild proximal dilatation with scattered air-fluid levels and mild wall thickening and enhancement of the mucosal folds. Findings suggest enteritis. The distal loops of small bowel/ ileum are normal. Moderate stool throughout the colon is noted. No mass or inflammation. A rectal tube is in place. The terminal ileum is normal. The appendix is normal. Vascular/Lymphatic: Stable atherosclerotic calcifications involving aorta and branch vessels. The major venous structures are  patent. Stable scattered retroperitoneal lymph nodes but no mass or overt adenopathy. Reproductive: Stable enlarged fibroid uterus. The ovaries are normal. Other: No pelvic mass or adenopathy. The inflammatory phlegmon in the right pelvis surrounding the colon has completely resolved. Musculoskeletal: No significant bony findings. IMPRESSION: 1. Stable left-sided nephrostomy tube and large calculus in the renal pelvis. There appears to be some chronic inflammatory process in the renal pelvis with contrast enhancement. 2. Stable 12.5 mm distal left ureteral calculus. 3. Foley catheter in a decompressed bladder with slight irregular wall thickening. 4. Resolution of the right lower quadrant/right-sided pelvic inflammatory process. 5. Dilated and thick walled proximal small bowel/jejunum could be due to enteritis. The ileum and colon are unremarkable except for constipation. 6. Small scattered low-attenuation liver lesions are stable and likely benign cysts. The Electronically Signed   By: Marijo Sanes M.D.   On: 04/20/2016 09:30   Dg Chest Port 1 View  Result Date: 05/07/2016 CLINICAL DATA:  55 year old female with respiratory failure EXAM: PORTABLE CHEST 1 VIEW COMPARISON:  Prior chest x-ray 05/05/2016 FINDINGS: The patient remains intubated. The tip of the endotracheal tube is 6.3 cm above the carina. A gastric tube is present. The tip of the gastric tube projects over the distal esophagus. This has significantly pulled back compared to prior. Left IJ approach central venous catheter with the catheter tip overlying the cavoatrial junction. Stable cardiac and mediastinal contours. Persistent low inspiratory volumes with nonspecific bibasilar opacities. Probable small left layering pleural effusion. Patient is rotated toward the left on today's  exam. IMPRESSION: 1. The nasogastric tube has significantly pulled back. The tip now overlies the distal esophagus. If the an tennis for gastric decompression, recommend  advancing this tube approximately 10 cm. 2. Other support apparatus in stable and satisfactory position. 3. Persistent low inspiratory volumes with nonspecific bibasilar opacities favored to reflect atelectasis. 4. Suspect small left pleural effusion. Electronically Signed   By: Jacqulynn Cadet M.D.   On: 05/07/2016 08:18   Dg Chest Portable 1 View  Result Date: 05/05/2016 CLINICAL DATA:  Central line placement EXAM: PORTABLE CHEST 1 VIEW COMPARISON:  05/04/2016 at 2359 hours FINDINGS: Endotracheal tube tip measures 4.1 cm above the carina. A left central venous catheter has been placed with tip over the low SVC region. No pneumothorax. Enteric tube tip is off the field of view but below the left hemidiaphragm. Heart size and pulmonary vascularity are normal. Shallow inspiration with atelectasis in the left lung base. No blunting of costophrenic angles. No pneumothorax. Mediastinal contours appear intact. IMPRESSION: Appliances appear in satisfactory position. Atelectasis in the left lung base. Electronically Signed   By: Lucienne Capers M.D.   On: 05/05/2016 03:21   Dg Chest Port 1 View  Result Date: 05/05/2016 CLINICAL DATA:  Endotracheal tube adjustment EXAM: PORTABLE CHEST 1 VIEW COMPARISON:  05/04/2016 FINDINGS: Endotracheal tube has been pulled back but tip is still low, measuring 1.3 cm above the carina. Consider withdrawing the tube another 1-2 cm. Enteric tube is unchanged in position. Shallow inspiration. Normal heart size and pulmonary vascularity. Lungs are clear. IMPRESSION: Endotracheal tube has been pulled back of the right mainstem bronchus but remains low, measuring only about 1.3 cm above the carina. Consider withdrawing the tube another 1-2 cm. Electronically Signed   By: Lucienne Capers M.D.   On: 05/05/2016 00:30   Dg Chest Port 1 View  Result Date: 05/05/2016 CLINICAL DATA:  Endotracheal tube placement EXAM: PORTABLE CHEST 1 VIEW COMPARISON:  03/19/2016 FINDINGS: Endotracheal  tube has been placed with tip only about 3 mm above the carina and directed to the mainstem bronchus on the right. A subsequent image has been obtained after repositioning of the tube. Enteric tube tip is off the field of view but below the left hemidiaphragm. Shallow inspiration. Heart size and pulmonary vascularity are normal. Lungs are clear. IMPRESSION: Endotracheal tube tip is at the carina directed towards the right mainstem bronchus. Shallow inspiration. Electronically Signed   By: Lucienne Capers M.D.   On: 05/05/2016 00:27   Ct Renal Stone Study  Result Date: 05/05/2016 CLINICAL DATA:  Acute onset of septic shock.  Initial encounter. EXAM: CT ABDOMEN AND PELVIS WITHOUT CONTRAST TECHNIQUE: Multidetector CT imaging of the abdomen and pelvis was performed following the standard protocol without IV contrast. COMPARISON:  CT of the abdomen and pelvis from 04/19/2016 FINDINGS: Lower chest: Mild bibasilar atelectasis is noted. Trace pericardial fluid likely remains within normal limits. Hepatobiliary: The liver is unremarkable in appearance. The gallbladder is unremarkable in appearance. The common bile duct remains normal in caliber. Pancreas: The pancreas is within normal limits. Spleen: The spleen is unremarkable in appearance. Adrenals/Urinary Tract: The adrenal glands are unremarkable in appearance. The kidneys are within normal limits. There is no evidence of hydronephrosis. No renal or ureteral stones are identified. No perinephric stranding is seen. Mild to moderate bilateral hydronephrosis is noted. A left-sided nephrostomy tube is noted. Two large left renal stones measure up to 1.9 cm in size. Nonspecific perinephric stranding is noted bilaterally. A large obstructing left ureteral  stone is noted, measuring 1.9 x 1.0 cm, 2 cm above the left renal pelvis. Stomach/Bowel: The patient's enteric tube is noted ending at the antrum of the stomach. The stomach is grossly unremarkable in appearance. The  small bowel is unremarkable. The appendix is normal in caliber, without evidence of appendicitis. The colon is partially filled with stool. There is mild wall thickening along the proximal sigmoid colon, with diffuse soft tissue inflammation noted about the lower abdomen and pelvis, tracking about small and large bowel loops. Complex fluid tracks about the bladder and uterus. Vascular/Lymphatic: Scattered calcification is seen along the abdominal aorta and its branches. No definite retroperitoneal lymphadenopathy is seen. Reproductive: The bladder is diffusely thick walled, with a Foley catheter in place. Given the extent of surrounding soft tissue inflammation, findings are concerning for relatively severe cystitis. Multiple small uterine fibroids are noted. The ovaries are not well characterized. No suspicious adnexal masses are seen. Other: No additional soft tissue abnormalities are seen. Musculoskeletal: No acute osseous abnormalities are identified. The visualized musculature is unremarkable in appearance. IMPRESSION: 1. Diffuse bladder wall thickening, with complex fluid tracking about the bladder and uterus, but no defined abscess at this time. Diffuse soft tissue inflammation extends about the lower abdomen and pelvis. This is concerning for diffuse pelvic infection and cystitis. 2. Mild to moderate bilateral hydronephrosis, with a left-sided nephrostomy tube. Large obstructing left ureteral stone measures 1.9 x 1.0 cm, 2 cm above the left renal pelvis. Right-sided hydronephrosis appears to reflect the underlying bladder process. 3. Nonobstructing left renal stones measure up to 1.9 cm in size. 4. Mild wall thickening about the proximal sigmoid colon. This may be reactive secondary to diffuse pelvic inflammation. 5. Multiple small uterine fibroids noted. 6. Mild bibasilar atelectasis noted. 7. Scattered aortic atherosclerosis. Electronically Signed   By: Garald Balding M.D.   On: 05/05/2016 01:04   Ir  Nephrostomy Placement Right  Result Date: 05/16/2016 INDICATION: 55 year old female with a history of bilateral ureteral obstruction. She has had a prior percutaneous nephrostomy placed on the left, and she presents for exchange. She has developed a right-sided ureteral obstruction. EXAM: IR NEPHROURETERAL CATH PLACEMENT RIGHT; IR EXCHANGE NEPHROSTOMY LEFT COMPARISON:  None. MEDICATIONS: 1 g Rocephin; The antibiotic was administered in an appropriate time frame prior to skin puncture. ANESTHESIA/SEDATION: Fentanyl 3.0 mcg IV; Versed 150 mg IV Moderate Sedation Time:  20 The patient was continuously monitored during the procedure by the interventional radiology nurse under my direct supervision. CONTRAST:  20 cc - administered into the collecting system(s) FLUOROSCOPY TIME:  Fluoroscopy Time: 1 minutes 42 seconds (3 mGy). COMPLICATIONS: None PROCEDURE: Informed written consent was obtained from the patient after a thorough discussion of the procedural risks, benefits and alternatives. All questions were addressed. Maximal Sterile Barrier Technique was utilized including caps, mask, sterile gowns, sterile gloves, sterile drape, hand hygiene and skin antiseptic. A timeout was performed prior to the initiation of the procedure. Patient positioned prone position on the fluoroscopy table. Small amount of contrast was then injected through the left indwelling percutaneous nephrostomy confirming position. Catheter was ligated. Using modified Seldinger technique, the indwelling 10 French drain was exchanged for a new 10 [place] drain. Final image was stored. Lidocaine was used for local anesthesia and a catheter was sutured in position. Ultrasound survey of the right flank was then performed with images stored and sent to PACs. The patient was then prepped and draped in the usual sterile fashion. 1% lidocaine was used to anesthetize the skin and  subcutaneous tissues for local anesthesia. A Chiba needle was then used to  access a posterior inferior calyx with ultrasound guidance. With spontaneous urine returned through the needle, passage of an 018 micro wire into the collecting system was performed under fluoroscopy. A small incision was made with an 11 blade scalpel, and the needle was removed from the wire. An Accustick system was then advanced over the wire into the collecting system under fluoroscopy. The metal stiffener and inner dilator were removed, and then a sample of fluid was aspirated through the 4 French outer sheath. Bentson wire was passed into the collecting system and the sheath removed. Ten French dilation of the soft tissues was performed. Using modified Seldinger technique, a 10 French pigtail catheter drain was placed over the Bentson wire. Wire and inner stiffener removed, and the pigtail was formed in the collecting system. Small amount of contrast confirmed position of the catheter. Patient tolerated the procedure well and remained hemodynamically stable throughout. No complications were encountered and no significant blood loss encountered IMPRESSION: Status post image guided exchange of nonfunctioning left percutaneous nephrostomy tube as well as placement of a new 10 French pigtail drainage catheter into the right collecting system to treat obstruction of the right ureter. Signed, Dulcy Fanny. Earleen Newport, DO [facility] Electronically Signed   By: Corrie Mckusick D.O.   On: 05/16/2016 08:56   Ir Nephrostomy Exchange Left  Result Date: 05/16/2016 INDICATION: 55 year old female with a history of bilateral ureteral obstruction. She has had a prior percutaneous nephrostomy placed on the left, and she presents for exchange. She has developed a right-sided ureteral obstruction. EXAM: IR NEPHROURETERAL CATH PLACEMENT RIGHT; IR EXCHANGE NEPHROSTOMY LEFT COMPARISON:  None. MEDICATIONS: 1 g Rocephin; The antibiotic was administered in an appropriate time frame  prior to skin puncture. ANESTHESIA/SEDATION: Fentanyl 3.0 mcg IV; Versed 150 mg IV Moderate Sedation Time:  20 The patient was continuously monitored during the procedure by the interventional radiology nurse under my direct supervision. CONTRAST:  20 cc - administered into the collecting system(s) FLUOROSCOPY TIME:  Fluoroscopy Time: 1 minutes 42 seconds (3 mGy). COMPLICATIONS: None PROCEDURE: Informed written consent was obtained from the patient after a thorough discussion of the procedural risks, benefits and alternatives. All questions were addressed. Maximal Sterile Barrier Technique was utilized including caps, mask, sterile gowns, sterile gloves, sterile drape, hand hygiene and skin antiseptic. A timeout was performed prior to the initiation of the procedure. Patient positioned prone position on the fluoroscopy table. Small amount of contrast was then injected through the left indwelling percutaneous nephrostomy confirming position. Catheter was ligated. Using modified Seldinger technique, the indwelling 10 French drain was exchanged for a new 10 [place] drain. Final image was stored. Lidocaine was used for local anesthesia and a catheter was sutured in position. Ultrasound survey of the right flank was then performed with images stored and sent to PACs. The patient was then prepped and draped in the usual sterile fashion. 1% lidocaine was used to anesthetize the skin and subcutaneous tissues for local anesthesia. A Chiba needle was then used to access a posterior inferior calyx with ultrasound guidance. With spontaneous urine returned through the needle, passage of an 018 micro wire into the collecting system was performed under fluoroscopy. A small incision was made with an 11 blade scalpel, and the needle was removed from the wire. An Accustick system was then advanced over the wire into the collecting system under fluoroscopy. The metal stiffener and inner dilator were  removed, and then a sample of fluid  was aspirated through the 4 French outer sheath. Bentson wire was passed into the collecting system and the sheath removed. Ten French dilation of the soft tissues was performed. Using modified Seldinger technique, a 10 French pigtail catheter drain was placed over the Bentson wire. Wire and inner stiffener removed, and the pigtail was formed in the collecting system. Small amount of contrast confirmed position of the catheter. Patient tolerated the procedure well and remained hemodynamically stable throughout. No complications were encountered and no significant blood loss encountered IMPRESSION: Status post image guided exchange of nonfunctioning left percutaneous nephrostomy tube as well as placement of a new 10 French pigtail drainage catheter into the right collecting system to treat obstruction of the right ureter. Signed, Dulcy Fanny. Earleen Newport, DO [facility] Radiology Electronically Signed   By: Corrie Mckusick D.O.   On: 05/16/2016 08:56   Ir Nephrostomy Exchange Left  Result Date: 04/19/2016 CLINICAL DATA:  Left nephrolithiasis. Percutaneous nephrostomy catheter placed 12/25/2015, exchanged 02/23/2016. Patient returns for scheduled exchange. EXAM: LEFT PERCUTANEOUS NEPHROSTOMY CATHETER EXCHANGE UNDER FLUOROSCOPY FLUOROSCOPY TIME:  0.9 minute, 282 uGym2 DAP TECHNIQUE: The procedure, risks (including but not limited to bleeding, infection, organ damage ), benefits, and alternatives were explained to the patient. Questions regarding the procedure were encouraged and answered. The patient understands and consents to the procedure. The nephrostomy tube and surrounding skin were prepped with Betadine, draped in usual sterile fashion. A small amount of contrast was injected through the left nephrostomy catheter to opacify the renal collecting system. The catheter had pulled back into an upper pole calyx. The catheter was cut and exchanged over a 0.035" angiographic  wire for a new 10-French pigtail catheter, formed centrally within the collecting system under fluoroscopy. Contrast injection confirms appropriate positioning. Catheter secured externally with 0 Prolene suture. The patient tolerated the procedure well. COMPLICATIONS: None. IMPRESSION: 1. Technically successful exchange of left nephrostomy catheter under fluoroscopy Electronically Signed   By: Lucrezia Europe M.D.   On: 04/19/2016 15:13    Assessment/Plan   Generalized weakness Will have her work with physical therapy and occupational therapy team to help with gait training and muscle strengthening exercises.fall precautions. Skin care. Encourage to be out of bed.   E.coli UTI Completed her antibiotic, encouraged hydration. Continue foley care.   New right hydronephrosis S/p right nephrostomy tube placement 05/13/16. To follow with IR. Draining dark colored urine. Afebrile. Monitor.  Left renal stone With bilateral nephrostomy tubes draining well. Had left nephrostomy tube changed in hospital. She will need left PCNL. make urology referral  Dm type 2 with neuropathy Monitor cbg, continue glipizide 5 mg daily and metformin 250 mg bid. Continue neurontin 100 mg qhs. Check a1c Lab Results  Component Value Date   HGBA1C 5.1 02/18/2016   Chronic lymphedema Stable, continue furosemide 40 mg bid and monitor. Continue kcl. Monitor bmp  Protein calorie malnutrition Continue protein supplement and multivitamin  HTN Stable bp, monitor, continue lisinopril 2.5 mg daily, check bmp  Neurogenic bladder Continue vesicare and foley catheter with foley care  ckd stage 3 Monitor bmp  Anemia of chronic disease Monitor h&h  gerd Continue her pepcid and monitor  Chronic DVT Continue xarelto and monitor  Chronic constipation Continue miralax and senna s    Goals of care: long term care   Labs/tests ordered: cbc, cmp, a1c 05/23/16  Family/ staff Communication: reviewed care plan with  patient and nursing supervisor   [facility]  Bubba Camp, MD Internal Medicine [facility] [address] [place], [place] [address] Cell Phone (Monday-Friday 8 am - 5 pm): [phone] On Call: [phone] and follow prompts after 5 pm and on weekends Office Phone: [phone] Office Fax: [phone]

## 2016-05-23 LAB — HEMOGLOBIN A1C: Hemoglobin A1C: 5.6

## 2016-05-23 LAB — CBC AND DIFFERENTIAL
HEMATOCRIT: 30 % — AB (ref 36–46)
HEMOGLOBIN: 9.5 g/dL — AB (ref 12.0–16.0)
Platelets: 432 10*3/uL — AB (ref 150–399)
WBC: 8.6 10*3/mL

## 2016-05-23 LAB — HEPATIC FUNCTION PANEL
ALT: 13 U/L (ref 7–35)
AST: 10 U/L — AB (ref 13–35)
Alkaline Phosphatase: 57 U/L (ref 25–125)
Bilirubin, Total: 0.4 mg/dL

## 2016-05-23 LAB — BASIC METABOLIC PANEL WITH GFR
BUN: 27 mg/dL — AB (ref 4–21)
Creatinine: 0.7 mg/dL (ref 0.5–1.1)
Glucose: 88 mg/dL
Potassium: 4.1 mmol/L (ref 3.4–5.3)
Sodium: 139 mmol/L (ref 137–147)

## 2016-06-01 ENCOUNTER — Other Ambulatory Visit: Payer: Self-pay | Admitting: Urology

## 2016-06-01 DIAGNOSIS — N200 Calculus of kidney: Secondary | ICD-10-CM

## 2016-06-01 DIAGNOSIS — N132 Hydronephrosis with renal and ureteral calculous obstruction: Secondary | ICD-10-CM

## 2016-06-09 ENCOUNTER — Encounter (HOSPITAL_COMMUNITY): Payer: Self-pay

## 2016-06-09 ENCOUNTER — Ambulatory Visit (HOSPITAL_COMMUNITY)
Admission: RE | Admit: 2016-06-09 | Discharge: 2016-06-09 | Disposition: A | Discharge status: Home / Self Care | Payer: Medicare Other | Source: Ambulatory Visit | Attending: Urology | Admitting: Urology

## 2016-06-09 DIAGNOSIS — D259 Leiomyoma of uterus, unspecified: Secondary | ICD-10-CM | POA: Insufficient documentation

## 2016-06-09 DIAGNOSIS — N319 Neuromuscular dysfunction of bladder, unspecified: Secondary | ICD-10-CM | POA: Insufficient documentation

## 2016-06-09 DIAGNOSIS — N132 Hydronephrosis with renal and ureteral calculous obstruction: Secondary | ICD-10-CM | POA: Insufficient documentation

## 2016-06-09 DIAGNOSIS — I700 Atherosclerosis of aorta: Secondary | ICD-10-CM | POA: Diagnosis not present

## 2016-06-09 DIAGNOSIS — N200 Calculus of kidney: Secondary | ICD-10-CM

## 2016-06-09 MED ORDER — IOPAMIDOL (ISOVUE-300) INJECTION 61%
100.0000 mL | Freq: Once | INTRAVENOUS | Status: AC | PRN
  Administered 2016-06-09: 100 mL via INTRAVENOUS

## 2016-06-09 MED ORDER — IOPAMIDOL (ISOVUE-300) INJECTION 61%
INTRAVENOUS | Status: AC
  Filled 2016-06-09: qty 75

## 2016-06-09 MED ORDER — IOPAMIDOL (ISOVUE-300) INJECTION 61%
INTRAVENOUS | Status: AC
  Administered 2016-06-09: 50 mL via INTRAVESICAL
  Filled 2016-06-09: qty 100

## 2016-06-14 ENCOUNTER — Other Ambulatory Visit (HOSPITAL_COMMUNITY): Payer: Medicare Other

## 2016-06-14 ENCOUNTER — Encounter: Payer: Self-pay | Admitting: Family

## 2016-06-14 ENCOUNTER — Non-Acute Institutional Stay (SKILLED_NURSING_FACILITY): Payer: Medicare Other | Admitting: Family

## 2016-06-14 DIAGNOSIS — R310 Gross hematuria: Secondary | ICD-10-CM

## 2016-06-14 NOTE — Progress Notes (Signed)
Location:  [facility] Room Number: [id number] Place of Service:  SNF [phone]) Provider:  Dinah Ngetich FNP-C  Patricia Serve, MD  Patient Care Team: Patricia Serve, MD as PCP - General (Internal Medicine)  Extended Emergency Contact Information Primary Emergency Contact: [name] Address: [address]          [place], [place] [address] [name] of [place] Phone: [phone] Mobile Phone: [phone] Relation: Son Secondary Emergency Contact: [name] Address: [address] [place], [place] [address] [name] of [place] Phone: [phone] Mobile Phone: [phone] Relation: Relative  Code Status:  Full code Goals of care: Advanced Directive information Advanced Directives 06/14/2016  Does Patient Have a Medical Advance Directive? No  Type of Advance Directive -  Does patient want to make changes to medical advance directive? -  Copy of [place] in Chart? -  Would patient like information on creating a medical advance directive? -  Pre-existing out of facility DNR order (yellow form or pink MOST form) -     Chief Complaint  Patient presents with  . Acute Visit    HPI:  Pt is a 56 y.o. female seen today at [facility] for an acute visit for evaluation of blood in the urine. She has a significant medical history of Hydronephrosis with Nephrostomy tube, CKD stage 3 among other conditions. She seen in her room today per facility Nurse request. Nurse reports patient's foley catheter and Nephrostomy draining blood urine. She denies any fever, chills or urinary symptoms.    Past Medical History:  Diagnosis Date  . Acute renal failure superimposed on stage 3 chronic kidney disease ([place]) 07/21/2015  . Anemia    chronic   . Diabetes mellitus without complication ([place])   . Diabetic neuropathy ([place])   . DVT (deep venous thrombosis) ([place])   . DVT (deep venous thrombosis)  ([place])   . GERD (gastroesophageal reflux disease)   . Hx of sepsis   . Hypertension   . Left nephrolithiasis   . Leukocytosis   . Lymph edema    chronic   . Malnutrition of moderate degree 07/21/2015  . Multiple sclerosis diagnosed 2002-not on Therapy any longer 06/26/2012  . Muscle weakness (generalized)   . Neuromuscular disorder ([place])   . Neuromuscular dysfunction of bladder   . Polyneuropathy ([place])   . Presence of indwelling urinary catheter   . Protein calorie malnutrition ([place])   . Stage 4 decubitus ulcer ([place]) 07/05/2015  . Stroke ([place])   . UTI (lower urinary tract infection)    Past Surgical History:  Procedure Laterality Date  . ESOPHAGOGASTRODUODENOSCOPY Left 01/02/2015   Procedure: ESOPHAGOGASTRODUODENOSCOPY (EGD);  Surgeon: Patricia Silence, MD;  Location: [facility];  Service: Endoscopy;  Laterality: Left;  . HERNIA MESH REMOVAL    . IR GENERIC HISTORICAL  02/23/2016   IR NEPHROSTOMY EXCHANGE LEFT 02/23/2016 Patricia Mariscal, MD [facility]  . IR GENERIC HISTORICAL  04/19/2016   IR NEPHROSTOMY EXCHANGE LEFT 04/19/2016 Patricia Cleveland, MD [facility]  . IR GENERIC HISTORICAL  05/13/2016   IR NEPHROSTOMY EXCHANGE LEFT 05/13/2016 [name], [name]  . IR GENERIC HISTORICAL  05/13/2016   IR NEPHROSTOMY PLACEMENT RIGHT 05/13/2016 [name], [name]  . NEPHROSTOMY TUBE PLACEMENT ([place] HX)    . TRANSURETHRAL RESECTION OF BLADDER TUMOR N/A 03/16/2016   Procedure: CYSTOSCOPY BLADDER BIOPSY;  Surgeon: Patricia Gallo, MD;  Location: [facility];  Service: Urology;  Laterality: N/A;  . TUBAL LIGATION    . tubes tided      No Known Allergies  Allergies as of 06/14/2016   No Known Allergies     Medication List       Accurate as of 06/14/16  3:09 PM. Always use your most recent med list.          acetaminophen 325 MG tablet Commonly known as:  TYLENOL Take 2 tablets (650 mg total) by mouth every 6 (six) hours as needed for fever.   albuterol (2.5 MG/3ML) 0.083%  nebulizer solution Commonly known as:  PROVENTIL Take 3 mLs (2.5 mg total) by nebulization every 6 (six) hours as needed for wheezing or shortness of breath.   atorvastatin 10 MG tablet Commonly known as:  LIPITOR Take 10 mg by mouth at bedtime.   famotidine 20 MG tablet Commonly known as:  PEPCID Take 1 tablet (20 mg total) by mouth 2 (two) times daily.   feeding supplement (PRO-STAT SUGAR FREE 64) Liqd Take 30 mLs by mouth 2 (two) times daily.   furosemide 20 MG tablet Commonly known as:  LASIX Take 40 mg by mouth 2 (two) times daily.   gabapentin 100 MG capsule Commonly known as:  NEURONTIN Take 1 capsule (100 mg total) by mouth at bedtime.   glipiZIDE 5 MG tablet Commonly known as:  GLUCOTROL Take 5 mg by mouth daily before breakfast.   lisinopril 2.5 MG tablet Commonly known as:  PRINIVIL,ZESTRIL Take 2.5 mg by mouth daily.   metFORMIN 500 MG tablet Commonly known as:  GLUCOPHAGE Take 0.5 tablets (250 mg total) by mouth 2 (two) times daily.   multivitamin with minerals Tabs tablet Take 1 tablet by mouth daily.   omega-3 acid ethyl esters 1 g capsule Commonly known as:  LOVAZA Take 1 g by mouth daily.   polyethylene glycol packet Commonly known as:  MIRALAX / GLYCOLAX Take 17 g by mouth 2 (two) times daily.   potassium chloride 10 MEQ tablet Commonly known as:  K-DUR,KLOR-CON Take 1 tablet (10 mEq total) by mouth daily.   rivaroxaban 20 MG Tabs tablet Commonly known as:  XARELTO Take 1 tablet (20 mg total) by mouth daily with supper. Reported on 08/12/2015       Review of Systems  Constitutional: Negative for activity change, appetite change, chills, fatigue and fever.  HENT: Negative for congestion, rhinorrhea, sinus pressure, sneezing and sore throat.   Eyes: Negative.   Respiratory: Negative for cough, chest tightness, shortness of breath and wheezing.   Cardiovascular: Negative for chest pain, palpitations and leg swelling.  Gastrointestinal:  Negative for abdominal distention, abdominal pain, constipation, diarrhea, nausea and vomiting.  Genitourinary: Negative for flank pain and urgency.       Foley Catheter and Left Nephrostomy   Musculoskeletal: Positive for gait problem.  Skin: Negative for color change, pallor, rash and wound.  Neurological: Negative for dizziness, seizures, syncope, light-headedness, numbness and headaches.  Hematological: Does not bruise/bleed easily.  Psychiatric/Behavioral: Negative for agitation, confusion, hallucinations and sleep disturbance. The patient is not nervous/anxious.     Immunization History  Administered Date(s) Administered  . Influenza Split 06/26/2012  . Influenza-Unspecified 04/02/2015  . PPD Test 01/04/2015, 07/09/2015, 07/23/2015, 09/02/2015, 12/31/2015  . Pneumococcal-Unspecified 03/13/2014   Pertinent  Health Maintenance Due  Topic Date Due  . FOOT EXAM  10/19/1968  . OPHTHALMOLOGY EXAM  10/19/1968  . PAP SMEAR  10/20/1979  . COLONOSCOPY  10/19/2008  .  MAMMOGRAM  09/19/2015  . INFLUENZA VACCINE  08/01/2016 (Originally 01/12/2016)  . HEMOGLOBIN A1C  08/17/2016      Vitals:   06/14/16 1455  BP: (!) 124/57  Pulse: 82  Resp: 18  Temp: 98.6 F (37 C)  SpO2: 97%  Weight: 145 lb (65.8 kg)  Height: 5\' 6"  (1.676 m)   Body mass index is 23.4 kg/m. Physical Exam  Constitutional: She is oriented to person, place, and time. She appears well-developed and well-nourished. No distress.  HENT:  Head: Normocephalic.  Mouth/Throat: Oropharynx is clear and moist. No oropharyngeal exudate.  Eyes: Conjunctivae and EOM are normal. Pupils are equal, round, and reactive to light. Right eye exhibits no discharge. Left eye exhibits no discharge. No scleral icterus.  Neck: Normal range of motion. No JVD present. No thyromegaly present.  Cardiovascular: Normal rate, regular rhythm, normal heart sounds and intact distal pulses.  Exam reveals no gallop and no friction rub.   No murmur  heard. Pulmonary/Chest: Effort normal and breath sounds normal. No respiratory distress. She has no wheezes. She has no rales.  Abdominal: Soft. Bowel sounds are normal. She exhibits no distension. There is no rebound.  Genitourinary:  Genitourinary Comments: Foley cathter and Left Nephrostomy draining adequate amount of blood tinge urine.   Musculoskeletal: She exhibits no edema, tenderness or deformity.  RUE /Bilateral LE's weakness   Lymphadenopathy:    She has no cervical adenopathy.  Neurological: She is oriented to person, place, and time.  Skin: Skin is warm and dry. No rash noted. No erythema. No pallor.  Sacral and right gluteal wound drsg dry, clean and intact. No signs of infections noted.  Psychiatric: She has a normal mood and affect.    Labs reviewed:  Recent Labs  05/06/16 0500 05/07/16 0455 05/07/16 1428  05/08/16 1800  05/13/16 0416 05/14/16 0534 05/16/16 0458 05/23/16  NA 143 142 141  < >  --   < > 146* 144 138 139  K 2.6* 2.3* 3.3*  < >  --   < > 3.2* 3.6 4.2 4.1  CL 117* 117* 119*  < >  --   < > 116* 112* 105  --   CO2 15* 17* 14*  < >  --   < > 23 25 28   --   GLUCOSE 154* 141* 180*  < >  --   < > 95 108* 87  --   BUN 36* 25* 25*  < >  --   < > 8 7 5* 27*  CREATININE 1.15* 0.83 0.86  < >  --   < > 0.51 0.54 0.53 0.7  CALCIUM 8.5* 8.3* 8.1*  < >  --   < > 9.1 9.0 9.2  --   MG 1.4* 1.5* 2.2  --  2.0  --   --   --   --   --   PHOS 1.9* 2.2*  --   --  2.2*  --   --   --   --   --   < > = values in this interval not displayed.  Recent Labs  12/19/15 0744  03/08/16 1240 03/24/16 05/05/16 0032 05/23/16  AST 12*  < > 21 10* 37 10*  ALT 7*  < > 13* 11 17 13   ALKPHOS 50  < > 40 66 37* 57  BILITOT 0.5  --  0.6  --  1.3*  --   PROT 6.6  --  7.7  --  5.3*  --  ALBUMIN 2.3*  --  3.2*  --  2.2*  --   < > = values in this interval not displayed.  Recent Labs  05/05/16 0032  05/13/16 0416  05/15/16 0252 05/16/16 0458 05/16/16 1553 05/17/16 0608 05/23/16    WBC 8.8  < > 12.9*  < > 12.0* 10.5 9.2  --  8.6  NEUTROABS 7.8*  --  7.8*  --   --  5.8  --   --   --   HGB 11.4*  < > 7.5*  < > 7.5* 8.0* 8.2* 8.4* 9.5*  HCT 34.8*  < > 23.3*  < > 23.2* 25.0* 25.6* 26.2* 30*  MCV 89.5  < > 86.9  < > 87.9 89.0 89.8  --   --   PLT 258  < > 279  < > 310 421* 421*  --  432*  < > = values in this interval not displayed. Lab Results  Component Value Date   TSH 1.787 07/21/2015   Lab Results  Component Value Date   HGBA1C 5.6 05/23/2016   Lab Results  Component Value Date   CHOL 169 02/18/2016   HDL 52 02/18/2016   LDLCALC 95 02/18/2016   TRIG 109 02/18/2016    Assessment/Plan  Hematuria  Afebrile.Will obtain urine specimen for U/A and C/S rule UTI.CBC/diff, BMP 06/16/2015.    Family/ staff Communication: Reviewed plan of care with patient and facility Nurse supervisor.   Labs/tests ordered:  CBC/diff, BMP 06/16/2015.

## 2016-06-22 ENCOUNTER — Non-Acute Institutional Stay (SKILLED_NURSING_FACILITY): Payer: Medicare Other | Admitting: Family

## 2016-06-22 DIAGNOSIS — N319 Neuromuscular dysfunction of bladder, unspecified: Secondary | ICD-10-CM | POA: Diagnosis not present

## 2016-06-22 DIAGNOSIS — E118 Type 2 diabetes mellitus with unspecified complications: Secondary | ICD-10-CM | POA: Diagnosis not present

## 2016-06-22 DIAGNOSIS — M792 Neuralgia and neuritis, unspecified: Secondary | ICD-10-CM | POA: Diagnosis not present

## 2016-06-22 DIAGNOSIS — K219 Gastro-esophageal reflux disease without esophagitis: Secondary | ICD-10-CM

## 2016-06-22 DIAGNOSIS — N390 Urinary tract infection, site not specified: Secondary | ICD-10-CM

## 2016-06-22 NOTE — Progress Notes (Signed)
Location:  [place] Room Number: [place] of Service:  SNF [id number]) Provider:  Marlowe Sax FNP-C    Extended Emergency Contact Information Primary Emergency Contact: Hughes,Michael Address: [address]          [place], [place] [address] Johnnette Litter of [place] Phone: [phone] Mobile Phone: [phone] Relation: Son Secondary Emergency Contact: [facility] Address: [address] [place], [place] [address] Johnnette Litter of [place] Phone: [phone] Mobile Phone: [phone] Relation: Relative  Code Status:  Full Code  Goals of care: Advanced Directive information Advanced Directives 06/14/2016  Does Patient Have a Medical Advance Directive? No  Type of Advance Directive -  Does patient want to make changes to medical advance directive? -  Copy of [place] in Chart? -  Would patient like information on creating a medical advance directive? -  Pre-existing out of facility DNR order (yellow form or pink MOST form) -     Chief Complaint  Patient presents with  . Medical Management of Chronic Issues    HPI:  Pt is a 56 y.o. female seen today at [facility] for medical management of chronic diseases.She has a medical history of MS, Type 2 DM, CKD stage 3, Neuropathic pain , Neurogenic bladder among other conditions. She is seen in her room today. She denies any acute issues this visit. Facility CNA  reported that patient's indwelling foley catheter has blood. Blood also noted from patient's peri-area. Bilateral nephrostomy tube draining yellow urine. She is currently on oral antibiotics for recent Urinary tract infections. She denies any fever, chills or cough. She has had weight gain over the past three months. She continues to follow up with facility Dietician due to previous weight loss.    Past Medical History:  Diagnosis Date  . Acute renal failure superimposed on  stage 3 chronic kidney disease ([place]) 07/21/2015  . Anemia    chronic   . Diabetes mellitus without complication ([place])   . Diabetic neuropathy ([place])   . DVT (deep venous thrombosis) ([place])   . DVT (deep venous thrombosis) ([place])   . GERD (gastroesophageal reflux disease)   . Hx of sepsis   . Hypertension   . Left nephrolithiasis   . Leukocytosis   . Lymph edema    chronic   . Malnutrition of moderate degree 07/21/2015  . Multiple sclerosis diagnosed 2002-not on Therapy any longer 06/26/2012  . Muscle weakness (generalized)   . Neuromuscular disorder ([place])   . Neuromuscular dysfunction of bladder   . Polyneuropathy ([place])   . Presence of indwelling urinary catheter   . Protein calorie malnutrition ([place])   . Stage 4 decubitus ulcer ([place]) 07/05/2015  . Stroke ([place])   . UTI (lower urinary tract infection)    Past Surgical History:  Procedure Laterality Date  . ESOPHAGOGASTRODUODENOSCOPY Left 01/02/2015   Procedure: ESOPHAGOGASTRODUODENOSCOPY (EGD);  Surgeon: Arta Silence, MD;  Location: [facility];  Service: Endoscopy;  Laterality: Left;  . HERNIA MESH REMOVAL    . IR GENERIC HISTORICAL  02/23/2016   IR NEPHROSTOMY EXCHANGE LEFT 02/23/2016 Sandi Mariscal, MD [facility]  . IR GENERIC HISTORICAL  04/19/2016   IR NEPHROSTOMY EXCHANGE LEFT 04/19/2016 Arne Cleveland, MD [facility]  . IR GENERIC HISTORICAL  05/13/2016   IR NEPHROSTOMY EXCHANGE LEFT 05/13/2016 Corrie Mckusick, DO [facility]  . IR GENERIC HISTORICAL  05/13/2016   IR  NEPHROSTOMY PLACEMENT RIGHT 05/13/2016 Corrie Mckusick, DO [facility]  . NEPHROSTOMY TUBE PLACEMENT ([place] HX)    . TRANSURETHRAL RESECTION OF BLADDER TUMOR N/A 03/16/2016   Procedure: CYSTOSCOPY BLADDER BIOPSY;  Surgeon: Franchot Gallo, MD;  Location: [facility];  Service: Urology;  Laterality: N/A;  . TUBAL LIGATION    . tubes tided      No Known Allergies  Allergies as of 06/22/2016   No Known Allergies     Medication List       Accurate as of 06/22/16  6:26  PM. Always use your most recent med list.          acetaminophen 325 MG tablet Commonly known as:  TYLENOL Take 2 tablets (650 mg total) by mouth every 6 (six) hours as needed for fever.   albuterol (2.5 MG/3ML) 0.083% nebulizer solution Commonly known as:  PROVENTIL Take 3 mLs (2.5 mg total) by nebulization every 6 (six) hours as needed for wheezing or shortness of breath.   atorvastatin 10 MG tablet Commonly known as:  LIPITOR Take 10 mg by mouth at bedtime.   famotidine 20 MG tablet Commonly known as:  PEPCID Take 1 tablet (20 mg total) by mouth 2 (two) times daily.   feeding supplement (PRO-STAT SUGAR FREE 64) Liqd Take 30 mLs by mouth 2 (two) times daily.   furosemide 20 MG tablet Commonly known as:  LASIX Take 40 mg by mouth 2 (two) times daily.   gabapentin 100 MG capsule Commonly known as:  NEURONTIN Take 1 capsule (100 mg total) by mouth at bedtime.   glipiZIDE 5 MG tablet Commonly known as:  GLUCOTROL Take 5 mg by mouth daily before breakfast.   lisinopril 2.5 MG tablet Commonly known as:  PRINIVIL,ZESTRIL Take 2.5 mg by mouth daily.   metFORMIN 500 MG tablet Commonly known as:  GLUCOPHAGE Take 0.5 tablets (250 mg total) by mouth 2 (two) times daily.   multivitamin with minerals Tabs tablet Take 1 tablet by mouth daily.   omega-3 acid ethyl esters 1 g capsule Commonly known as:  LOVAZA Take 1 g by mouth daily.   polyethylene glycol packet Commonly known as:  MIRALAX / GLYCOLAX Take 17 g by mouth 2 (two) times daily.   potassium chloride 10 MEQ tablet Commonly known as:  K-DUR,KLOR-CON Take 1 tablet (10 mEq total) by mouth daily.   rivaroxaban 20 MG Tabs tablet Commonly known as:  XARELTO Take 1 tablet (20 mg total) by mouth daily with supper. Reported on 08/12/2015       Review of Systems  Constitutional: Negative for activity change, appetite change, chills, fatigue and fever.  HENT: Negative for congestion, rhinorrhea, sinus pressure,  sneezing and sore throat.   Eyes: Negative.   Respiratory: Negative for cough, chest tightness, shortness of breath and wheezing.   Cardiovascular: Negative for chest pain, palpitations and leg swelling.  Gastrointestinal: Negative for abdominal distention, abdominal pain, constipation, diarrhea, nausea and vomiting.  Endocrine: Negative for cold intolerance, heat intolerance, polydipsia, polyphagia and polyuria.  Genitourinary: Negative for urgency.       Foley Catheter and Left Nephrostomy   Musculoskeletal: Positive for gait problem.  Skin: Negative for color change, pallor, rash and wound.  Neurological: Negative for dizziness, seizures, syncope, light-headedness, numbness and headaches.  Psychiatric/Behavioral: Negative for agitation, confusion, hallucinations and sleep disturbance. The patient is not nervous/anxious.     Immunization History  Administered Date(s) Administered  . Influenza Split 06/26/2012  . Influenza-Unspecified 04/02/2015  . PPD Test 01/04/2015,  07/09/2015, 07/23/2015, 09/02/2015, 12/31/2015  . Pneumococcal-Unspecified 03/13/2014   Pertinent  Health Maintenance Due  Topic Date Due  . FOOT EXAM  10/19/1968  . OPHTHALMOLOGY EXAM  10/19/1968  . PAP SMEAR  10/20/1979  . COLONOSCOPY  10/19/2008  . MAMMOGRAM  09/19/2015  . INFLUENZA VACCINE  08/01/2016 (Originally 01/12/2016)  . HEMOGLOBIN A1C  11/21/2016      Vitals:   06/22/16 1000  BP: 124/64  Pulse: (!) 58  Resp: 20  Temp: 98 F (36.7 C)  SpO2: 97%  Weight: 145 lb (65.8 kg)  Height: 5\' 6"  (1.676 m)   Body mass index is 23.4 kg/m. Physical Exam  Constitutional: She is oriented to person, place, and time.  HENT:  Head: Normocephalic.  Mouth/Throat: Oropharynx is clear and moist. No oropharyngeal exudate.  Eyes: Conjunctivae and EOM are normal. Pupils are equal, round, and reactive to light. Right eye exhibits no discharge. Left eye exhibits no discharge. No scleral icterus.  Neck: Normal range of  motion. No JVD present. No thyromegaly present.  Cardiovascular: Normal rate, regular rhythm, normal heart sounds and intact distal pulses.  Exam reveals no gallop and no friction rub.   No murmur heard. Pulmonary/Chest: Effort normal and breath sounds normal. No respiratory distress. She has no wheezes. She has no rales.  Abdominal: Soft. Bowel sounds are normal. She exhibits no distension. There is no rebound.  Genitourinary:  Genitourinary Comments: Foley cathter draining blood tinge urine with blood noted around insertion site. Bilateral Nephrostomy draining amber color urine.   Musculoskeletal: She exhibits no edema, tenderness or deformity.  RUE /Bilateral LE's weakness   Lymphadenopathy:    She has no cervical adenopathy.  Neurological: She is oriented to person, place, and time.  Skin: Skin is warm and dry. No rash noted. No erythema. No pallor.  Sacral ulcer managed by wound care Nurse.     Psychiatric: She has a normal mood and affect.    Labs reviewed:  Recent Labs  05/06/16 0500 05/07/16 0455 05/07/16 1428  05/08/16 1800  05/13/16 0416 05/14/16 0534 05/16/16 0458 05/23/16  NA 143 142 141  < >  --   < > 146* 144 138 139  K 2.6* 2.3* 3.3*  < >  --   < > 3.2* 3.6 4.2 4.1  CL 117* 117* 119*  < >  --   < > 116* 112* 105  --   CO2 15* 17* 14*  < >  --   < > 23 25 28   --   GLUCOSE 154* 141* 180*  < >  --   < > 95 108* 87  --   BUN 36* 25* 25*  < >  --   < > 8 7 5* 27*  CREATININE 1.15* 0.83 0.86  < >  --   < > 0.51 0.54 0.53 0.7  CALCIUM 8.5* 8.3* 8.1*  < >  --   < > 9.1 9.0 9.2  --   MG 1.4* 1.5* 2.2  --  2.0  --   --   --   --   --   PHOS 1.9* 2.2*  --   --  2.2*  --   --   --   --   --   < > = values in this interval not displayed.  Recent Labs  12/19/15 0744  03/08/16 1240 03/24/16 05/05/16 0032 05/23/16  AST 12*  < > 21 10* 37 10*  ALT 7*  < > 13* 11  17 13  ALKPHOS 50  < > 40 66 37* 57  BILITOT 0.5  --  0.6  --  1.3*  --   PROT 6.6  --  7.7  --  5.3*  --     ALBUMIN 2.3*  --  3.2*  --  2.2*  --   < > = values in this interval not displayed.  Recent Labs  05/05/16 0032  05/13/16 0416  05/15/16 0252 05/16/16 0458 05/16/16 1553 05/17/16 0608 05/23/16  WBC 8.8  < > 12.9*  < > 12.0* 10.5 9.2  --  8.6  NEUTROABS 7.8*  --  7.8*  --   --  5.8  --   --   --   HGB 11.4*  < > 7.5*  < > 7.5* 8.0* 8.2* 8.4* 9.5*  HCT 34.8*  < > 23.3*  < > 23.2* 25.0* 25.6* 26.2* 30*  MCV 89.5  < > 86.9  < > 87.9 89.0 89.8  --   --   PLT 258  < > 279  < > 310 421* 421*  --  432*  < > = values in this interval not displayed. Lab Results  Component Value Date   TSH 1.787 07/21/2015   Lab Results  Component Value Date   HGBA1C 5.6 05/23/2016   Lab Results  Component Value Date   CHOL 169 02/18/2016   HDL 52 02/18/2016   LDLCALC 95 02/18/2016   TRIG 109 02/18/2016    Assessment/Plan 1. Controlled type 2 diabetes mellitus with complication, without long-term current use of insulin  CBG's in the 80's-160's. Continue on Metformin 250 mg Tablet twice daily, Glipizide 5 mg tablet twice daily. Will reduce glipizide to 2.5 mg Tablet daily or D/c if CBG's continue to be stable.Monitor Hgb A1C.   2. Neuropathic pain  Continue on Gabapentin.   3. Neurogenic Bladder  Has indwelling foley catheter and bilateral Nephrostomy tubes. Blood tinge urine noted in foley bag and insertion site possible positioning foley or due to current UTI. Encouraged staff to secure foley catheter with leg strap to prevent pulling. Nephrostomy urine without any blood noted. Will refer to urologist for frequet UTI maybe consider suprapubic Catheter due to [place].Change Nephrostomy tube site drsg daily.   4. GERD Continue on Gabapentin.   5. Recurrent UTI Afebrile. Continue current antibiotics. Continue to monitor.    Family/ staff Communication: Reviewed plan of care with patient and facility Nurse supervisor.   Labs/tests ordered: None

## 2016-07-06 ENCOUNTER — Other Ambulatory Visit: Payer: Self-pay | Admitting: Student

## 2016-07-07 ENCOUNTER — Encounter (HOSPITAL_COMMUNITY): Payer: Self-pay | Admitting: Interventional Radiology

## 2016-07-07 ENCOUNTER — Other Ambulatory Visit: Payer: Self-pay | Admitting: Urology

## 2016-07-07 ENCOUNTER — Ambulatory Visit (HOSPITAL_COMMUNITY)
Admission: RE | Admit: 2016-07-07 | Discharge: 2016-07-07 | Disposition: A | Discharge status: Home / Self Care | Payer: Medicare Other | Source: Ambulatory Visit | Attending: Interventional Radiology | Admitting: Interventional Radiology

## 2016-07-07 ENCOUNTER — Other Ambulatory Visit (HOSPITAL_COMMUNITY): Payer: Self-pay | Admitting: Interventional Radiology

## 2016-07-07 DIAGNOSIS — N131 Hydronephrosis with ureteral stricture, not elsewhere classified: Secondary | ICD-10-CM

## 2016-07-07 DIAGNOSIS — Z87442 Personal history of urinary calculi: Secondary | ICD-10-CM | POA: Diagnosis not present

## 2016-07-07 DIAGNOSIS — G35 Multiple sclerosis: Secondary | ICD-10-CM | POA: Diagnosis not present

## 2016-07-07 DIAGNOSIS — Z436 Encounter for attention to other artificial openings of urinary tract: Secondary | ICD-10-CM | POA: Insufficient documentation

## 2016-07-07 DIAGNOSIS — N135 Crossing vessel and stricture of ureter without hydronephrosis: Secondary | ICD-10-CM

## 2016-07-07 HISTORY — PX: IR GENERIC HISTORICAL: IMG1180011

## 2016-07-07 MED ORDER — LIDOCAINE HCL 1 % IJ SOLN
INTRAMUSCULAR | Status: AC
  Filled 2016-07-07: qty 20

## 2016-07-07 MED ORDER — IOPAMIDOL (ISOVUE-300) INJECTION 61%
INTRAVENOUS | Status: AC
  Administered 2016-07-07: 20 mL via INTRAVENOUS
  Filled 2016-07-07: qty 50

## 2016-07-07 MED ORDER — IOPAMIDOL (ISOVUE-300) INJECTION 61%
50.0000 mL | Freq: Once | INTRAVENOUS | Status: AC | PRN
  Administered 2016-07-07: 20 mL via INTRAVENOUS

## 2016-07-12 ENCOUNTER — Emergency Department (HOSPITAL_COMMUNITY): Payer: Medicare Other

## 2016-07-12 ENCOUNTER — Inpatient Hospital Stay (HOSPITAL_COMMUNITY)
Admission: EM | Admit: 2016-07-12 | Discharge: 2016-07-28 | DRG: 871 | Disposition: A | Discharge status: Skilled Nursing Facility | Payer: Medicare Other | Attending: Internal Medicine | Admitting: Internal Medicine

## 2016-07-12 ENCOUNTER — Encounter (HOSPITAL_COMMUNITY): Payer: Self-pay | Admitting: *Deleted

## 2016-07-12 ENCOUNTER — Other Ambulatory Visit: Payer: Self-pay | Admitting: Urology

## 2016-07-12 DIAGNOSIS — N132 Hydronephrosis with renal and ureteral calculous obstruction: Secondary | ICD-10-CM | POA: Diagnosis present

## 2016-07-12 DIAGNOSIS — J189 Pneumonia, unspecified organism: Secondary | ICD-10-CM | POA: Diagnosis not present

## 2016-07-12 DIAGNOSIS — Z833 Family history of diabetes mellitus: Secondary | ICD-10-CM

## 2016-07-12 DIAGNOSIS — Z978 Presence of other specified devices: Secondary | ICD-10-CM

## 2016-07-12 DIAGNOSIS — N3000 Acute cystitis without hematuria: Secondary | ICD-10-CM | POA: Diagnosis not present

## 2016-07-12 DIAGNOSIS — Z87891 Personal history of nicotine dependence: Secondary | ICD-10-CM

## 2016-07-12 DIAGNOSIS — E876 Hypokalemia: Secondary | ICD-10-CM | POA: Diagnosis not present

## 2016-07-12 DIAGNOSIS — I513 Intracardiac thrombosis, not elsewhere classified: Secondary | ICD-10-CM | POA: Diagnosis present

## 2016-07-12 DIAGNOSIS — K219 Gastro-esophageal reflux disease without esophagitis: Secondary | ICD-10-CM | POA: Diagnosis present

## 2016-07-12 DIAGNOSIS — N3289 Other specified disorders of bladder: Secondary | ICD-10-CM

## 2016-07-12 DIAGNOSIS — E785 Hyperlipidemia, unspecified: Secondary | ICD-10-CM | POA: Diagnosis present

## 2016-07-12 DIAGNOSIS — Z936 Other artificial openings of urinary tract status: Secondary | ICD-10-CM

## 2016-07-12 DIAGNOSIS — R578 Other shock: Secondary | ICD-10-CM | POA: Diagnosis present

## 2016-07-12 DIAGNOSIS — E872 Acidosis: Secondary | ICD-10-CM | POA: Diagnosis present

## 2016-07-12 DIAGNOSIS — D649 Anemia, unspecified: Secondary | ICD-10-CM

## 2016-07-12 DIAGNOSIS — D509 Iron deficiency anemia, unspecified: Secondary | ICD-10-CM | POA: Diagnosis present

## 2016-07-12 DIAGNOSIS — T45515A Adverse effect of anticoagulants, initial encounter: Secondary | ICD-10-CM | POA: Diagnosis present

## 2016-07-12 DIAGNOSIS — G9341 Metabolic encephalopathy: Secondary | ICD-10-CM | POA: Diagnosis present

## 2016-07-12 DIAGNOSIS — D638 Anemia in other chronic diseases classified elsewhere: Secondary | ICD-10-CM | POA: Diagnosis present

## 2016-07-12 DIAGNOSIS — N739 Female pelvic inflammatory disease, unspecified: Secondary | ICD-10-CM | POA: Diagnosis present

## 2016-07-12 DIAGNOSIS — Z960 Presence of urogenital implants: Secondary | ICD-10-CM

## 2016-07-12 DIAGNOSIS — E43 Unspecified severe protein-calorie malnutrition: Secondary | ICD-10-CM | POA: Diagnosis present

## 2016-07-12 DIAGNOSIS — N183 Chronic kidney disease, stage 3 (moderate): Secondary | ICD-10-CM | POA: Diagnosis present

## 2016-07-12 DIAGNOSIS — D72829 Elevated white blood cell count, unspecified: Secondary | ICD-10-CM

## 2016-07-12 DIAGNOSIS — R6521 Severe sepsis with septic shock: Secondary | ICD-10-CM | POA: Diagnosis present

## 2016-07-12 DIAGNOSIS — Y95 Nosocomial condition: Secondary | ICD-10-CM | POA: Diagnosis not present

## 2016-07-12 DIAGNOSIS — I82409 Acute embolism and thrombosis of unspecified deep veins of unspecified lower extremity: Secondary | ICD-10-CM | POA: Diagnosis present

## 2016-07-12 DIAGNOSIS — R509 Fever, unspecified: Secondary | ICD-10-CM

## 2016-07-12 DIAGNOSIS — R188 Other ascites: Secondary | ICD-10-CM

## 2016-07-12 DIAGNOSIS — J9690 Respiratory failure, unspecified, unspecified whether with hypoxia or hypercapnia: Secondary | ICD-10-CM

## 2016-07-12 DIAGNOSIS — E1140 Type 2 diabetes mellitus with diabetic neuropathy, unspecified: Secondary | ICD-10-CM | POA: Diagnosis present

## 2016-07-12 DIAGNOSIS — R590 Localized enlarged lymph nodes: Secondary | ICD-10-CM | POA: Diagnosis present

## 2016-07-12 DIAGNOSIS — I7409 Other arterial embolism and thrombosis of abdominal aorta: Secondary | ICD-10-CM | POA: Diagnosis present

## 2016-07-12 DIAGNOSIS — E1122 Type 2 diabetes mellitus with diabetic chronic kidney disease: Secondary | ICD-10-CM | POA: Diagnosis present

## 2016-07-12 DIAGNOSIS — D6832 Hemorrhagic disorder due to extrinsic circulating anticoagulants: Secondary | ICD-10-CM | POA: Diagnosis present

## 2016-07-12 DIAGNOSIS — Z7984 Long term (current) use of oral hypoglycemic drugs: Secondary | ICD-10-CM

## 2016-07-12 DIAGNOSIS — Z86718 Personal history of other venous thrombosis and embolism: Secondary | ICD-10-CM

## 2016-07-12 DIAGNOSIS — D62 Acute posthemorrhagic anemia: Secondary | ICD-10-CM

## 2016-07-12 DIAGNOSIS — I129 Hypertensive chronic kidney disease with stage 1 through stage 4 chronic kidney disease, or unspecified chronic kidney disease: Secondary | ICD-10-CM | POA: Diagnosis present

## 2016-07-12 DIAGNOSIS — Z6823 Body mass index (BMI) 23.0-23.9, adult: Secondary | ICD-10-CM

## 2016-07-12 DIAGNOSIS — Z7901 Long term (current) use of anticoagulants: Secondary | ICD-10-CM

## 2016-07-12 DIAGNOSIS — G35 Multiple sclerosis: Secondary | ICD-10-CM | POA: Diagnosis present

## 2016-07-12 DIAGNOSIS — N319 Neuromuscular dysfunction of bladder, unspecified: Secondary | ICD-10-CM | POA: Diagnosis present

## 2016-07-12 DIAGNOSIS — N200 Calculus of kidney: Secondary | ICD-10-CM

## 2016-07-12 DIAGNOSIS — Z8673 Personal history of transient ischemic attack (TIA), and cerebral infarction without residual deficits: Secondary | ICD-10-CM

## 2016-07-12 DIAGNOSIS — R062 Wheezing: Secondary | ICD-10-CM

## 2016-07-12 DIAGNOSIS — Z820 Family history of epilepsy and other diseases of the nervous system: Secondary | ICD-10-CM

## 2016-07-12 DIAGNOSIS — A419 Sepsis, unspecified organism: Secondary | ICD-10-CM | POA: Diagnosis present

## 2016-07-12 DIAGNOSIS — Z79899 Other long term (current) drug therapy: Secondary | ICD-10-CM

## 2016-07-12 DIAGNOSIS — Z993 Dependence on wheelchair: Secondary | ICD-10-CM

## 2016-07-12 DIAGNOSIS — I10 Essential (primary) hypertension: Secondary | ICD-10-CM | POA: Diagnosis present

## 2016-07-12 DIAGNOSIS — N179 Acute kidney failure, unspecified: Secondary | ICD-10-CM | POA: Diagnosis present

## 2016-07-12 DIAGNOSIS — E861 Hypovolemia: Secondary | ICD-10-CM | POA: Diagnosis present

## 2016-07-12 DIAGNOSIS — E118 Type 2 diabetes mellitus with unspecified complications: Secondary | ICD-10-CM | POA: Diagnosis present

## 2016-07-12 DIAGNOSIS — Z9289 Personal history of other medical treatment: Secondary | ICD-10-CM | POA: Diagnosis not present

## 2016-07-12 LAB — URINALYSIS, ROUTINE W REFLEX MICROSCOPIC
Bilirubin Urine: NEGATIVE
GLUCOSE, UA: NEGATIVE mg/dL
Ketones, ur: NEGATIVE mg/dL
NITRITE: NEGATIVE
PH: 9 — AB (ref 5.0–8.0)
Protein, ur: >=300 mg/dL — AB
SPECIFIC GRAVITY, URINE: 1.013 (ref 1.005–1.030)
Squamous Epithelial / LPF: NONE SEEN

## 2016-07-12 LAB — COMPREHENSIVE METABOLIC PANEL
ALBUMIN: 2 g/dL — AB (ref 3.5–5.0)
ALT: 11 U/L — ABNORMAL LOW (ref 14–54)
ANION GAP: 5 (ref 5–15)
AST: 32 U/L (ref 15–41)
Alkaline Phosphatase: 41 U/L (ref 38–126)
BILIRUBIN TOTAL: 1.3 mg/dL — AB (ref 0.3–1.2)
BUN: 36 mg/dL — ABNORMAL HIGH (ref 6–20)
CHLORIDE: 121 mmol/L — AB (ref 101–111)
CO2: 16 mmol/L — ABNORMAL LOW (ref 22–32)
Calcium: 6.7 mg/dL — ABNORMAL LOW (ref 8.9–10.3)
Creatinine, Ser: 1.75 mg/dL — ABNORMAL HIGH (ref 0.44–1.00)
GFR calc Af Amer: 36 mL/min — ABNORMAL LOW (ref >60)
GFR calc non Af Amer: 31 mL/min — ABNORMAL LOW (ref >60)
GLUCOSE: 84 mg/dL (ref 65–99)
POTASSIUM: 4.1 mmol/L (ref 3.5–5.1)
SODIUM: 142 mmol/L (ref 135–145)
TOTAL PROTEIN: 5.2 g/dL — AB (ref 6.5–8.1)

## 2016-07-12 LAB — CBC WITH DIFFERENTIAL/PLATELET
Basophils Absolute: 0 10*3/uL (ref 0.0–0.1)
Basophils Relative: 0 %
EOS ABS: 0 10*3/uL (ref 0.0–0.7)
EOS PCT: 0 %
HEMATOCRIT: 14.7 % — AB (ref 36.0–46.0)
Hemoglobin: 4.8 g/dL — CL (ref 12.0–15.0)
Lymphocytes Relative: 6 %
Lymphs Abs: 1.1 10*3/uL (ref 0.7–4.0)
MCH: 29.3 pg (ref 26.0–34.0)
MCHC: 32.7 g/dL (ref 30.0–36.0)
MCV: 89.6 fL (ref 78.0–100.0)
MONO ABS: 1.3 10*3/uL — AB (ref 0.1–1.0)
Monocytes Relative: 7 %
NEUTROS PCT: 87 %
Neutro Abs: 16.5 10*3/uL — ABNORMAL HIGH (ref 1.7–7.7)
PLATELETS: 340 10*3/uL (ref 150–400)
RBC: 1.64 MIL/uL — AB (ref 3.87–5.11)
RDW: 16.8 % — AB (ref 11.5–15.5)
WBC: 18.9 10*3/uL — AB (ref 4.0–10.5)

## 2016-07-12 LAB — PREPARE RBC (CROSSMATCH)

## 2016-07-12 LAB — POC OCCULT BLOOD, ED: FECAL OCCULT BLD: POSITIVE — AB

## 2016-07-12 LAB — I-STAT CG4 LACTIC ACID, ED
Lactic Acid, Venous: 2.07 mmol/L (ref 0.5–1.9)
Lactic Acid, Venous: 3.22 mmol/L (ref 0.5–1.9)
Lactic Acid, Venous: 1.41 mmol/L (ref 0.5–1.9)

## 2016-07-12 LAB — LACTIC ACID, PLASMA: Lactic Acid, Venous: 1.1 mmol/L (ref 0.5–1.9)

## 2016-07-12 LAB — INFLUENZA PANEL BY PCR (TYPE A & B)
Influenza A By PCR: NEGATIVE
Influenza B By PCR: NEGATIVE

## 2016-07-12 LAB — PROTIME-INR
INR: 5.27 — AB
PROTHROMBIN TIME: 49.9 s — AB (ref 11.4–15.2)

## 2016-07-12 LAB — MRSA PCR SCREENING: MRSA by PCR: NEGATIVE

## 2016-07-12 MED ORDER — VANCOMYCIN HCL IN DEXTROSE 1-5 GM/200ML-% IV SOLN: 1000.0000 mg | INTRAVENOUS | Status: DC

## 2016-07-12 MED ORDER — SODIUM CHLORIDE 0.9 % IV BOLUS (SEPSIS)
1000.0000 mL | Freq: Once | INTRAVENOUS | Status: AC
  Administered 2016-07-12: 1000 mL via INTRAVENOUS

## 2016-07-12 MED ORDER — VANCOMYCIN 50 MG/ML ORAL SOLUTION
500.0000 mg | Freq: Four times a day (QID) | ORAL | Status: DC
  Administered 2016-07-13 – 2016-07-15 (×11): 500 mg via ORAL
  Filled 2016-07-12 (×15): qty 10

## 2016-07-12 MED ORDER — SODIUM CHLORIDE 0.9 % IV BOLUS (SEPSIS)
1000.0000 mL | Freq: Once | INTRAVENOUS | Status: AC
  Administered 2016-07-12: 2000 mL via INTRAVENOUS

## 2016-07-12 MED ORDER — NOREPINEPHRINE BITARTRATE 1 MG/ML IV SOLN
2.0000 ug/min | INTRAVENOUS | Status: DC
  Administered 2016-07-12: 8 ug/min via INTRAVENOUS
  Administered 2016-07-12: 7 ug/min via INTRAVENOUS
  Administered 2016-07-13: 3 ug/min via INTRAVENOUS
  Filled 2016-07-12 (×2): qty 4

## 2016-07-12 MED ORDER — ACETAMINOPHEN 325 MG PO TABS
650.0000 mg | ORAL_TABLET | ORAL | Status: DC | PRN
  Administered 2016-07-13 – 2016-07-18 (×3): 650 mg via ORAL
  Filled 2016-07-12 (×3): qty 2

## 2016-07-12 MED ORDER — SODIUM CHLORIDE 0.9 % IV SOLN: 250.0000 mL | INTRAVENOUS | Status: DC | PRN

## 2016-07-12 MED ORDER — PIPERACILLIN-TAZOBACTAM 3.375 G IVPB
3.3750 g | Freq: Three times a day (TID) | INTRAVENOUS | Status: DC
  Administered 2016-07-12 – 2016-07-22 (×29): 3.375 g via INTRAVENOUS
  Filled 2016-07-12 (×28): qty 50

## 2016-07-12 MED ORDER — SODIUM CHLORIDE 0.9 % IV SOLN
Freq: Once | INTRAVENOUS | Status: AC
  Administered 2016-07-12: 22:00:00 via INTRAVENOUS

## 2016-07-12 MED ORDER — ACETAMINOPHEN 650 MG RE SUPP
650.0000 mg | Freq: Once | RECTAL | Status: AC
  Administered 2016-07-12: 650 mg via RECTAL
  Filled 2016-07-12: qty 1

## 2016-07-12 MED ORDER — VANCOMYCIN HCL IN DEXTROSE 1-5 GM/200ML-% IV SOLN
1000.0000 mg | INTRAVENOUS | Status: DC
  Administered 2016-07-13: 1000 mg via INTRAVENOUS
  Filled 2016-07-12: qty 200

## 2016-07-12 MED ORDER — SODIUM CHLORIDE 0.9 % IV SOLN
INTRAVENOUS | Status: DC
  Administered 2016-07-12 – 2016-07-17 (×6): via INTRAVENOUS

## 2016-07-12 MED ORDER — SODIUM CHLORIDE 0.9 % IV SOLN
Freq: Once | INTRAVENOUS | Status: AC
  Administered 2016-07-12: 17:00:00 via INTRAVENOUS

## 2016-07-12 MED ORDER — VANCOMYCIN HCL IN DEXTROSE 1-5 GM/200ML-% IV SOLN
1000.0000 mg | Freq: Once | INTRAVENOUS | Status: AC
  Administered 2016-07-12: 1000 mg via INTRAVENOUS
  Filled 2016-07-12: qty 200

## 2016-07-12 MED ORDER — PIPERACILLIN-TAZOBACTAM 3.375 G IVPB: 3.3750 g | Freq: Three times a day (TID) | INTRAVENOUS | Status: DC

## 2016-07-12 MED ORDER — SODIUM CHLORIDE 0.9 % IV SOLN
1000.0000 mL | INTRAVENOUS | Status: DC
  Administered 2016-07-12: 1000 mL via INTRAVENOUS

## 2016-07-12 MED ORDER — NOREPINEPHRINE BITARTRATE 1 MG/ML IV SOLN
0.0000 ug/min | Freq: Once | INTRAVENOUS | Status: AC
  Administered 2016-07-12: 2 ug/min via INTRAVENOUS
  Filled 2016-07-12: qty 4

## 2016-07-12 MED ORDER — PIPERACILLIN-TAZOBACTAM 3.375 G IVPB 30 MIN
3.3750 g | Freq: Once | INTRAVENOUS | Status: AC
  Administered 2016-07-12: 3.375 g via INTRAVENOUS
  Filled 2016-07-12: qty 50

## 2016-07-12 MED ORDER — ONDANSETRON HCL 4 MG/2ML IJ SOLN: 4.0000 mg | Freq: Four times a day (QID) | INTRAMUSCULAR | Status: DC | PRN

## 2016-07-12 MED ORDER — ALBUTEROL SULFATE (2.5 MG/3ML) 0.083% IN NEBU
2.5000 mg | INHALATION_SOLUTION | RESPIRATORY_TRACT | Status: DC | PRN
  Administered 2016-07-20: 2.5 mg via RESPIRATORY_TRACT
  Filled 2016-07-12: qty 3

## 2016-07-12 MED ORDER — PANTOPRAZOLE SODIUM 40 MG IV SOLR
40.0000 mg | Freq: Every day | INTRAVENOUS | Status: DC
  Administered 2016-07-12 – 2016-07-13 (×2): 40 mg via INTRAVENOUS
  Filled 2016-07-12 (×2): qty 40

## 2016-07-12 NOTE — ED Triage Notes (Signed)
BIB EMS from [facility], w/ c/o of fever and possible sepsis. Pt has hx of sepsis. Pt has indwelling urostomy and other drain. Pt arrives alert and lethargic. Pt was given tylenol at [facility], amount unspecified.  Pt rectal temp here 103.3  EMS Vitals HR 132 BP 82/52 RR 28 SPO2 99% on 3L [place] CBG 131

## 2016-07-12 NOTE — Clinical Social Work Note (Signed)
Clinical Social Work Assessment  Patient Details  Name: [name] MRN: [id number] Date of Birth: 1960-07-12  Date of referral:  07/12/16               Reason for consult:  Discharge Planning                Permission sought to share information with:  Facility Art therapist granted to share information::     Name::        Agency::     Relationship::     Contact Information:     Housing/Transportation Living arrangements for the past 2 months:  [place] of Information:  Adult Children Patient Interpreter Needed:  None Criminal Activity/Legal Involvement Pertinent to Current Situation/Hospitalization:    Significant Relationships:  Adult Children Lives with:  Facility Resident Do you feel safe going back to the place where you live?  Yes Need for family participation in patient care:  Yes (Comment)  Care giving concerns:  None at this time  Facilities manager / plan:  CSW spoke with pt's daughter-in-law [name] who has POA with the pt's son Vista Mink and confirmed pt's plan to be discharged to [facility] to live at discharge.  Please reconsult if future social work needs arise.  Please call Legrand Como and [name] with room number once patient has been admitted on inpatient floor.    Employment status:  Retired Nurse, adult PT Recommendations:  Not assessed at this time Information / Referral to community resources:  [place]  Patient/Family's Response to care:  Pt's daughter-in-law appreciated the CSW's efforts and thanked the CSW.  Patient/Family's Understanding of and Emotional Response to Diagnosis, Current Treatment, and Prognosis:  Still assessing Emotional Assessment Appearance:  Appears older than stated age Attitude/Demeanor/Rapport:  Unable to Assess Affect (typically observed):  Unable to Assess Orientation:    Alcohol / Substance use:    Psych  involvement (Current and /or in the community):  No (Comment)  Discharge Needs  Concerns to be addressed:  Discharge Planning Concerns Readmission within the last 30 days:  No Current discharge risk:  None Barriers to Discharge:  No Barriers Identified   Claudine Mouton, LCSWA 07/12/2016, 7:07 PM

## 2016-07-12 NOTE — ED Notes (Signed)
hgb 4.8

## 2016-07-12 NOTE — ED Notes (Signed)
INR 5.27

## 2016-07-12 NOTE — Progress Notes (Signed)
[facility] Progress Note Patient Name: [name] DOB: [birth year] MRN: [id number]   Date of Service  07/12/2016  HPI/Events of Note  55 yo with shock-given 3.5 L NS, remains SBP 70's ARF wi th INR 5, worsening LA  eICU Interventions  Needs to be assessed for CVL and vasopressors PCCM team notified     Intervention Category Evaluation Type: New Patient Evaluation  [name] 07/12/2016, 5:29 PM

## 2016-07-12 NOTE — ED Notes (Signed)
Bed: WA04 Expected date:  Expected time:  Means of arrival:  Comments: EMS/?sepsis? 

## 2016-07-12 NOTE — ED Notes (Signed)
WILL TRANSPORT PT TO 2W 1241-1. AAOX4. PT IN NO APPARENT DISTRESS. IVF INFUSING W/O PAIN OR SWELLING. THE OPPORTUNITY TO ASK QUESTIONS WAS PROVIDED.

## 2016-07-12 NOTE — ED Provider Notes (Signed)
[facility] DEPT Provider Note   CSN: [id number] Arrival date & time: 07/12/16  1225     History   Chief Complaint Chief Complaint  Patient presents with  . Code Sepsis  . Fever    HPI [name] is a 56 y.o. female.  Patient is a 56 year old female with multiple medical problems including MS currently wheelchair-bound, diabetes, DVT on Xarelto, bilateral nephrostomy tubes from obstructing stones, recurrent sepsis presenting today with fever and hypotension. Patient states since Thursday she has not felt good but unclear when he started running fever. She has had decreased appetite and has had nothing to eat or drink today. She denies any diarrhea or abdominal pain. Nephrostomy tubes have been draining. She denies any cough or shortness of breath.  Patient had nephrostomy tubes changed on 07/07/2016. She was seen by nursing home provider last week and was on oral antibiotics for recent UTI. At that time they did note blood in nephrostomy tubes in the perirectal area   The history is provided by the patient, the EMS personnel and medical records.  Fever      Past Medical History:  Diagnosis Date  . Acute renal failure superimposed on stage 3 chronic kidney disease ([place]) 07/21/2015  . Anemia    chronic   . Diabetes mellitus without complication ([place])   . Diabetic neuropathy ([place])   . DVT (deep venous thrombosis) ([place])   . DVT (deep venous thrombosis) ([place])   . GERD (gastroesophageal reflux disease)   . Hx of sepsis   . Hypertension   . Left nephrolithiasis   . Leukocytosis   . Lymph edema    chronic   . Malnutrition of moderate degree 07/21/2015  . Multiple sclerosis diagnosed 2002-not on Therapy any longer 06/26/2012  . Muscle weakness (generalized)   . Neuromuscular disorder ([place])   . Neuromuscular dysfunction of bladder   . Polyneuropathy ([place])   . Presence of indwelling urinary catheter   . Protein calorie malnutrition ([place])   . Stage 4 decubitus ulcer ([place])  07/05/2015  . Stroke ([place])   . UTI (lower urinary tract infection)     Patient Active Problem List   Diagnosis Date Noted  . Hydronephrosis   . Acute respiratory failure with hypoxia ([place])   . Pelvic abscess in female 03/17/2016  . Septic shock ([place]) 03/08/2016  . Encounter for central line placement   . Arterial hypotension   . Neuropathic pain 01/11/2016  . Attention to nephrostomy ([place]) 01/11/2016  . Acute renal failure ([place])   . Partial small bowel obstruction 12/24/2015  . Bacteremia due to coagulase-negative Staphylococcus 12/22/2015  . Left nephrolithiasis 12/22/2015  . Gram-negative bacteremia 12/22/2015  . Palliative care encounter   . SIRS (systemic inflammatory response syndrome) ([place]) 12/18/2015  . Acute pyelonephritis   . Pressure ulcer   . Controlled diabetes mellitus type 2 with complications ([place])   . Neurogenic bladder 11/11/2015  . Physical deconditioning 11/11/2015  . Chronic indwelling Foley catheter 09/30/2015  . Hypokalemia 09/04/2015  . Dyslipidemia associated with type 2 diabetes mellitus ([place]) 08/28/2015  . GERD without esophagitis 08/28/2015  . Acute renal failure superimposed on stage 3 chronic kidney disease ([place]) 07/21/2015  . Leukocytosis 07/21/2015  . Anemia 07/21/2015  . Hyperkalemia 07/21/2015  . Urinary tract infection without hematuria 07/21/2015  . Malnutrition of moderate degree 07/21/2015  . DVT, lower extremity ([place]) 12/28/2014  . Multiple sclerosis diagnosed 2002-not on Therapy any longer 06/26/2012    Past Surgical History:  Procedure  Laterality Date  . ESOPHAGOGASTRODUODENOSCOPY Left 01/02/2015   Procedure: ESOPHAGOGASTRODUODENOSCOPY (EGD);  Surgeon: Arta Silence, MD;  Location: [facility];  Service: Endoscopy;  Laterality: Left;  . HERNIA MESH REMOVAL    . IR GENERIC HISTORICAL  02/23/2016   IR NEPHROSTOMY EXCHANGE LEFT 02/23/2016 Sandi Mariscal, MD [facility]  . IR GENERIC HISTORICAL  04/19/2016   IR NEPHROSTOMY EXCHANGE LEFT  04/19/2016 Arne Cleveland, MD [facility]  . IR GENERIC HISTORICAL  05/13/2016   IR NEPHROSTOMY EXCHANGE LEFT 05/13/2016 Corrie Mckusick, DO [facility]  . IR GENERIC HISTORICAL  05/13/2016   IR NEPHROSTOMY PLACEMENT RIGHT 05/13/2016 Corrie Mckusick, DO [facility]  . IR GENERIC HISTORICAL  07/07/2016   IR NEPHROSTOMY EXCHANGE RIGHT 07/07/2016 Jacqulynn Cadet, MD [facility]  . IR GENERIC HISTORICAL  07/07/2016   IR NEPHROSTOMY EXCHANGE LEFT 07/07/2016 Jacqulynn Cadet, MD [facility]  . NEPHROSTOMY TUBE PLACEMENT ([facility] [facility])    . TRANSURETHRAL RESECTION OF BLADDER TUMOR N/A 03/16/2016   Procedure: CYSTOSCOPY BLADDER BIOPSY;  Surgeon: Franchot Gallo, MD;  Location: [facility];  Service: Urology;  Laterality: N/A;  . TUBAL LIGATION    . tubes tided      OB History    No data available       Home Medications    Prior to Admission medications   Medication Sig Start Date End Date Taking? Authorizing Provider  acetaminophen (TYLENOL) 325 MG tablet Take 2 tablets (650 mg total) by mouth every 6 (six) hours as needed for fever. 03/20/16  Yes Bonnielee Haff, MD  albuterol (PROVENTIL) (2.5 MG/3ML) 0.083% nebulizer solution Take 3 mLs (2.5 mg total) by nebulization every 6 (six) hours as needed for wheezing or shortness of breath. 12/30/15  Yes Robbie Lis, MD  atorvastatin (LIPITOR) 10 MG tablet Take 10 mg by mouth at bedtime.    Yes Historical Provider, MD  famotidine (PEPCID) 20 MG tablet Take 1 tablet (20 mg total) by mouth 2 (two) times daily. 11/18/15  Yes Cherene Altes, MD  furosemide (LASIX) 40 MG tablet Take 40 mg by mouth 2 (two) times daily.   Yes Historical Provider, MD  gabapentin (NEURONTIN) 100 MG capsule Take 1 capsule (100 mg total) by mouth at bedtime. 11/18/15  Yes Cherene Altes, MD  glipiZIDE (GLUCOTROL) 5 MG tablet Take 5 mg by mouth daily before breakfast.   Yes Historical Provider, MD  lisinopril (PRINIVIL,ZESTRIL) 2.5 MG tablet Take 2.5 mg by mouth daily.  11/30/15  Yes  Historical Provider, MD  metFORMIN (GLUCOPHAGE) 500 MG tablet Take 0.5 tablets (250 mg total) by mouth 2 (two) times daily. 04/22/16  Yes Dinah C Ngetich, NP  Multiple Vitamin (MULTIVITAMIN WITH MINERALS) TABS tablet Take 1 tablet by mouth daily. 07/09/15  Yes Florencia Reasons, MD  nystatin cream (MYCOSTATIN) Apply 1 application topically 2 (two) times daily.   Yes Historical Provider, MD  omega-3 acid ethyl esters (LOVAZA) 1 g capsule Take 1 g by mouth daily.   Yes Historical Provider, MD  polyethylene glycol (MIRALAX / GLYCOLAX) packet Take 17 g by mouth 2 (two) times daily. 11/18/15  Yes Cherene Altes, MD  potassium chloride (K-DUR,KLOR-CON) 10 MEQ tablet Take 1 tablet (10 mEq total) by mouth daily. 03/21/16  Yes Bonnielee Haff, MD  Amino Acids-Protein Hydrolys (FEEDING SUPPLEMENT, PRO-STAT SUGAR FREE 64,) LIQD Take 30 mLs by mouth 2 (two) times daily. Patient not taking: Reported on 07/12/2016 11/18/15   Cherene Altes, MD  rivaroxaban (XARELTO) 20 MG TABS tablet Take 1  tablet (20 mg total) by mouth daily with supper. Reported on 08/12/2015 Patient not taking: Reported on 07/12/2016 11/18/15   Cherene Altes, MD    Family History Family History  Problem Relation Age of Onset  . Diabetes Mother   . Alzheimer's disease Mother   . Diabetes Father   . Diabetes Sister   . Scoliosis Sister     Social History Social History  Substance Use Topics  . Smoking status: Former Smoker    Packs/day: 1.00    Years: 20.00    Types: Cigarettes    Quit date: 11/19/2014  . Smokeless tobacco: Never Used  . Alcohol use 3.0 oz/week    5 Glasses of wine per week     Allergies   Patient has no known allergies.   Review of Systems Review of Systems  Constitutional: Positive for fever.  All other systems reviewed and are negative.    Physical Exam Updated Vital Signs BP (!) 83/57 (BP Location: Right Arm)   Pulse 116   Temp (!) 103.3 F (39.6 C) (Rectal)   Resp 18   Wt 145 lb 1 oz (65.8 kg)   SpO2  99%   BMI 23.41 kg/m   Physical Exam  Constitutional: She is oriented to person, place, and time. She appears well-developed. She appears listless. She appears cachectic. She appears toxic. No distress.  HENT:  Head: Normocephalic and atraumatic.  Mouth/Throat: Oropharynx is clear and moist. Mucous membranes are dry.  Eyes: Conjunctivae and EOM are normal. Pupils are equal, round, and reactive to light.  Neck: Normal range of motion. Neck supple.  Cardiovascular: Regular rhythm and intact distal pulses.  Tachycardia present.   No murmur heard. Pulmonary/Chest: Effort normal and breath sounds normal. No respiratory distress. She has no wheezes. She has no rales.  Abdominal: Soft. She exhibits no distension. There is no tenderness. There is no rebound and no guarding.  Bilateral nepho  Musculoskeletal: Normal range of motion. She exhibits no edema or tenderness.  Legs with mild contractures bilaterally and severe muscle wasting  Neurological: She is oriented to person, place, and time. She appears listless.  Skin: Skin is warm and dry. Capillary refill takes more than 3 seconds. No rash noted. No erythema.  Nursing note and vitals reviewed.    ED Treatments / Results  Labs (all labs ordered are listed, but only abnormal results are displayed) Labs Reviewed  COMPREHENSIVE METABOLIC PANEL - Abnormal; Notable for the following:       Result Value   Chloride 121 (*)    CO2 16 (*)    BUN 36 (*)    Creatinine, Ser 1.75 (*)    Calcium 6.7 (*)    Total Protein 5.2 (*)    Albumin 2.0 (*)    ALT 11 (*)    Total Bilirubin 1.3 (*)    GFR calc non Af Amer 31 (*)    GFR calc Af Amer 36 (*)    All other components within normal limits  CBC WITH DIFFERENTIAL/PLATELET - Abnormal; Notable for the following:    WBC 18.9 (*)    RBC 1.64 (*)    Hemoglobin 4.8 (*)    HCT 14.7 (*)    RDW 16.8 (*)    Neutro Abs 16.5 (*)    Monocytes Absolute 1.3 (*)    All other components within normal  limits  PROTIME-INR - Abnormal; Notable for the following:    Prothrombin Time 49.9 (*)    INR 5.27 (*)  All other components within normal limits  I-STAT CG4 LACTIC ACID, ED - Abnormal; Notable for the following:    Lactic Acid, Venous 2.07 (*)    All other components within normal limits  I-STAT CG4 LACTIC ACID, ED - Abnormal; Notable for the following:    Lactic Acid, Venous 3.22 (*)    All other components within normal limits  CULTURE, BLOOD (ROUTINE X 2)  CULTURE, BLOOD (ROUTINE X 2)  URINE CULTURE  URINALYSIS, ROUTINE W REFLEX MICROSCOPIC  TYPE AND SCREEN  PREPARE RBC (CROSSMATCH)    EKG  EKG Interpretation  Date/Time:  Tuesday July 12 2016 12:42:42 EST Ventricular Rate:  134 PR Interval:    QRS Duration: 73 QT Interval:  301 QTC Calculation: 450 R Axis:   -102 Text Interpretation:  Sinus tachycardia Left anterior fascicular block Abnormal R-wave progression, late transition No significant change since last tracing Confirmed by Maryan Rued  MD, Loree Fee ([id number]) on 07/12/2016 2:05:02 PM       Radiology Dg Chest Port 1 View  Result Date: 07/12/2016 CLINICAL DATA:  Shortness of breath today.  Hypertension. EXAM: PORTABLE CHEST 1 VIEW COMPARISON:  05/07/2016 FINDINGS: Normal heart, mediastinum and hila. Lungs are clear. No pleural effusion or pneumothorax. Skeletal structures are unremarkable. IMPRESSION: No active disease. Electronically Signed   By: Lajean Manes M.D.   On: 07/12/2016 13:25   Ct Renal Stone Study  Result Date: 07/12/2016 CLINICAL DATA:  Sepsis, nephrostomy tubes, follow, Ensure per pre placement, history diabetes mellitus, multiple sclerosis, acute on chronic renal failure, stroke, former smoker EXAM: CT ABDOMEN AND PELVIS WITHOUT CONTRAST TECHNIQUE: Multidetector CT imaging of the abdomen and pelvis was performed following the standard protocol without IV contrast. Sagittal and coronal MPR images reconstructed from axial data set. GI contrast was not  administered. Beam hardening artifacts in the upper abdomen from inclusion of the patient's arms in the imaged field COMPARISON:  None. FINDINGS: Lower chest: Minimal dependent bibasilar atelectasis. Hepatobiliary: Gallbladder and liver grossly unremarkable Pancreas: Normal appearance Spleen: Normal appearance Adrenals/Urinary Tract: Thickened adrenal glands without mass. BILATERALnephrostomy tubes present at the renal pelves. Large cyst lateral LEFT kidney 3.3 x 2.6 cm image 38. Mild LEFT renal collecting system dilatation. Moderate RIGHT renal collecting system dilatation. Nonobstructing LEFT renal calculi, 17 mm upper pole coronal image 80 and 18 mm renal pelvis coronal image 77. Dilatation of RIGHT ureter to the bladder. LEFT ureter decompressed with unchanged visualization of a 10 mm LEFT ureteral calculus image 77. No definite RIGHT ureteral calculus. Diffusely thickened and irregular bladder wall with small RIGHT lateral bladder diverticulum. Stomach/Bowel: Normal appendix. Thickened sigmoid colon with infiltrative changes of the sigmoid mesocolon, favor inflammatory process such as sigmoid colitis or diverticulitis. 13 mm diameter fluid levelcollection anterior to the urinary bladder, uncertain if represents a tiny bladder diverticulum containing air or a tiny extraluminal collection arising from a sigmoid colonic inflammatory process. Stomach and remaining bowel loops unremarkable. Vascular/Lymphatic: Atherosclerotic calcification without aneurysm. Upper normal size retroperitoneal nodes without abdominal or pelvic adenopathy. Reproductive: Unremarkable uterus and ovaries. Other: No free air or ascites.  No hernia. Musculoskeletal: Osseous demineralization diffusely. IMPRESSION: Expected position of the pigtails of the BILATERAL nephrostomy tubes within the renal collecting systems, with RIGHT greater than LEFT collecting system dilatation of the kidneys. Nonobstructing LEFT renal calculi and 10 mm distal  LEFT ureteral calculus. Thick irregular bladder wall with small bladder diverticulum, question of cystitis raised. Thickened segment of sigmoid colon with pericolic inflammatory changes question due to diverticulitis or colitis.  13 x 16 mm diameter extraluminal fat/fluid level anterior to the urinary bladder, nonspecific. Electronically Signed   By: Lavonia Dana M.D.   On: 07/12/2016 15:44    Procedures Procedures (including critical care time)  Medications Ordered in ED Medications  0.9 %  sodium chloride infusion (1,000 mLs Intravenous New Bag/Given 07/12/16 1316)  sodium chloride 0.9 % bolus 1,000 mL (0 mLs Intravenous Stopped 07/12/16 1345)    And  sodium chloride 0.9 % bolus 1,000 mL (1,000 mLs Intravenous New Bag/Given 07/12/16 1435)  vancomycin (VANCOCIN) IVPB 1000 mg/200 mL premix (1,000 mg Intravenous New Bag/Given 07/12/16 1359)  vancomycin (VANCOCIN) IVPB 1000 mg/200 mL premix (not administered)  piperacillin-tazobactam (ZOSYN) IVPB 3.375 g (not administered)  0.9 %  sodium chloride infusion (not administered)  piperacillin-tazobactam (ZOSYN) IVPB 3.375 g (0 g Intravenous Stopped 07/12/16 1356)  acetaminophen (TYLENOL) suppository 650 mg (650 mg Rectal Given 07/12/16 1359)     Initial Impression / Assessment and Plan / ED Course  I have reviewed the triage vital signs and the nursing notes.  Pertinent labs & imaging results that were available during my care of the patient were reviewed by me and considered in my medical decision making (see chart for details).    Patient is a 56 year old female with multiple medical problems presenting today with code sepsis. Patient has a fever of 103 and is hypotensive. She does have a history of bilateral nephrostomy tubes in recurrent infections with multiple organisms. She currently is awake and able to answer questions.  She denies any shortness of breath or respiratory symptoms. She has had no chest pain. She has no evidence of fluid overload  and appears dehydrated at this time. Patient was given 3 L of fluid and is also found to be anemic with a hemoglobin of 4.8. She is on anticoagulation for DVT prophylaxis and concern for losing blood either from recent nephrostomy change or GI source. Hemoccult pending but rectal exam with normal brown stool. Patient covered with broad-spectrum anaerobic antibiotics with Vanco and Zosyn. Patient confirmed that she is a full code. No respiratory issues at this time. Patient given rectal Tylenol for temperature of 103. Blood ordered and spoke with critical care. Patient's blood pressure does not improve after fluid boluses and blood will start a central line in start pressors. Patient does have evidence of AK I today with worsening lactic acid with hydration from 2 to 3. Leukocytosis of 18,000. Will send urine from nephrostomy bag but no other way to obtain it is there is no urine in the Foley catheter. Also CT renal image to evaluate for kidney abscess. Spoke with critical care who will come and evaluate the patient.  CRITICAL CARE Performed by: Blanchie Dessert Total critical care time: 45 minutes Critical care time was exclusive of separately billable procedures and treating other patients. Critical care was necessary to treat or prevent imminent or life-threatening deterioration. Critical care was time spent personally by me on the following activities: development of treatment plan with patient and/or surrogate as well as nursing, discussions with consultants, evaluation of patient's response to treatment, examination of patient, obtaining history from patient or surrogate, ordering and performing treatments and interventions, ordering and review of laboratory studies, ordering and review of radiographic studies, pulse oximetry and re-evaluation of patient's condition.  Pt continued to be hypotensive.  Central line placed and levophed started.  CC to see  CENTRAL LINE Performed by:  Blanchie Dessert Consent: The procedure was performed in an emergent situation. Required items: required  blood products, implants, devices, and special equipment available Patient identity confirmed: arm band and provided demographic data Time out: Immediately prior to procedure a "time out" was called to verify the correct patient, procedure, equipment, support staff and site/side marked as required. Indications: vascular access Anesthesia: local infiltration Local anesthetic: lidocaine 1% with epinephrine Anesthetic total: 3 ml Patient sedated: no Preparation: skin prepped with 2% chlorhexidine Skin prep agent dried: skin prep agent completely dried prior to procedure Sterile barriers: all five maximum sterile barriers used - cap, mask, sterile gown, sterile gloves, and large sterile sheet Hand hygiene: hand hygiene performed prior to central venous catheter insertion  Location details: right IJ  Catheter type: triple lumen Catheter size: 8 Fr Pre-procedure: landmarks identified Ultrasound guidance: yes Successful placement: yes Post-procedure: line sutured and dressing applied Assessment: blood return through all parts, free fluid flow, placement verified by x-ray and no pneumothorax on x-ray Patient tolerance: Patient tolerated the procedure well with no immediate complications.  Final Clinical Impressions(s) / ED Diagnoses   Final diagnoses:  Septic shock ([place])  Fever, unspecified fever cause  Anemia, unspecified type    New Prescriptions New Prescriptions   No medications on file     Blanchie Dessert, MD 07/12/16 1719

## 2016-07-12 NOTE — Progress Notes (Signed)
PULMONARY / CRITICAL CARE MEDICINE   Name: [name] MRN: [id number] DOB: 12-06-1960    ADMISSION DATE:  07/12/2016 CONSULTATION DATE:  07/12/2016  REFERRING MD:  Dr. Theora Gianotti  CHIEF COMPLAINT:  Fever  HISTORY OF PRESENT ILLNESS:   This is a 56 year old female with a past medical history significant for multiple sclerosis, has a history of DVT on Xarelto and was brought To the [place] emergency department on 07/12/2016 with a history of fever. The patient was encephalopathic on my exam and could provide no further history. History is obtained by reviewing the records. Records that she recently had bilateral obstructing nephrolithiasis and underwent bilateral percutaneous nephrostomy tubes placed in December 2017. She also has a history of DVT and takes Xarelto. In our emergency department she was noted to be febrile to 103, hypotensive, have a lactic acidosis, and hemoglobin of 4. Pulmonary critical care medicine was consulted for further evaluation. In the emergency department she has not passed blood but she was noted be Hemoccult positive. Despite receiving 2-4 L of saline (it's unclear if this time if it was 2 or 4) she remains hypotensive with a pressure in the 70s.  PAST MEDICAL HISTORY :  She  has a past medical history of Acute renal failure superimposed on stage 3 chronic kidney disease ([place]) (07/21/2015); Anemia; Diabetes mellitus without complication ([place]); Diabetic neuropathy (HCC); DVT (deep venous thrombosis) ([place]); DVT (deep venous thrombosis) ([place]); GERD (gastroesophageal reflux disease); sepsis; Hypertension; Left nephrolithiasis; Leukocytosis; Lymph edema; Malnutrition of moderate degree (07/21/2015); Multiple sclerosis diagnosed 2002-not on Therapy any longer (06/26/2012); Muscle weakness (generalized); Neuromuscular disorder ([place]); Neuromuscular dysfunction of bladder; Polyneuropathy ([place]); Presence of indwelling urinary catheter; Protein calorie malnutrition ([place]); Stage 4  decubitus ulcer ([place]) (07/05/2015); Stroke [facility]); and UTI (lower urinary tract infection).  PAST SURGICAL HISTORY: She  has a past surgical history that includes Hernia mesh removal; tubes tided; Tubal ligation; Esophagogastroduodenoscopy (Left, 01/02/2015); ir generic historical (02/23/2016); NEPHROSTOMY TUBE PLACEMENT ([place] HX); Transurethral resection of bladder tumor (N/A, 03/16/2016); ir generic historical (04/19/2016); ir generic historical (05/13/2016); ir generic historical (05/13/2016); ir generic historical (07/07/2016); and ir generic historical (07/07/2016).  No Known Allergies  No current facility-administered medications on file prior to encounter.    Current Outpatient Prescriptions on File Prior to Encounter  Medication Sig  . acetaminophen (TYLENOL) 325 MG tablet Take 2 tablets (650 mg total) by mouth every 6 (six) hours as needed for fever.  Marland Kitchen albuterol (PROVENTIL) (2.5 MG/3ML) 0.083% nebulizer solution Take 3 mLs (2.5 mg total) by nebulization every 6 (six) hours as needed for wheezing or shortness of breath.  Marland Kitchen atorvastatin (LIPITOR) 10 MG tablet Take 10 mg by mouth at bedtime.   . famotidine (PEPCID) 20 MG tablet Take 1 tablet (20 mg total) by mouth 2 (two) times daily.  Marland Kitchen gabapentin (NEURONTIN) 100 MG capsule Take 1 capsule (100 mg total) by mouth at bedtime.  Marland Kitchen glipiZIDE (GLUCOTROL) 5 MG tablet Take 5 mg by mouth daily before breakfast.  . lisinopril (PRINIVIL,ZESTRIL) 2.5 MG tablet Take 2.5 mg by mouth daily.   . metFORMIN (GLUCOPHAGE) 500 MG tablet Take 0.5 tablets (250 mg total) by mouth 2 (two) times daily.  . Multiple Vitamin (MULTIVITAMIN WITH MINERALS) TABS tablet Take 1 tablet by mouth daily.  Marland Kitchen omega-3 acid ethyl esters (LOVAZA) 1 g capsule Take 1 g by mouth daily.  . polyethylene glycol (MIRALAX / GLYCOLAX) packet Take 17 g by mouth 2 (two) times daily.  . potassium chloride (K-DUR,KLOR-CON) 10  MEQ tablet Take 1 tablet (10 mEq total) by mouth daily.  . Amino  Acids-Protein Hydrolys (FEEDING SUPPLEMENT, PRO-STAT SUGAR FREE 64,) LIQD Take 30 mLs by mouth 2 (two) times daily. (Patient not taking: Reported on 07/12/2016)  . rivaroxaban (XARELTO) 20 MG TABS tablet Take 1 tablet (20 mg total) by mouth daily with supper. Reported on 08/12/2015 (Patient not taking: Reported on 07/12/2016)    FAMILY HISTORY:  Her indicated that her mother is alive. She indicated that her father is alive.    SOCIAL HISTORY: She  reports that she quit smoking about 19 months ago. Her smoking use included Cigarettes. She has a 20.00 pack-year smoking history. She has never used smokeless tobacco. She reports that she drinks about 3.0 oz of alcohol per week . She reports that she does not use drugs.  REVIEW OF SYSTEMS:   Cannot obtain due to obtunded state  SUBJECTIVE:  As above  VITAL SIGNS: BP 102/56   Pulse 87   Temp 98.2 F (36.8 C) (Axillary)   Resp (!) 28   Ht 5\' 5"  (1.651 m)   Wt 145 lb (65.8 kg)   SpO2 100%   BMI 24.13 kg/m   HEMODYNAMICS:    VENTILATOR SETTINGS:    INTAKE / OUTPUT: No intake/output data recorded.  PHYSICAL EXAMINATION: General:  Chronically ill appearing female, lying in bed Neuro:  Arouses to touch, follow simple commands, will give one-word answers HEENT:  Normocephalic atraumatic mucous membranes dry Cardiovascular:  Regular rate and rhythm, no clear murmurs gallops or rubs Lungs:  Clear to auscultation bilaterally with normal effort Abdomen:  Belly soft, nontender nondistended Musculoskeletal:  Diminished bulk and tone bilaterally in keeping with known diagnosis of multiple sclerosis Skin:  No rash or skin breakdown  LABS:  BMET  Recent Labs Lab 07/12/16 1239  NA 142  K 4.1  CL 121*  CO2 16*  BUN 36*  CREATININE 1.75*  GLUCOSE 84    Electrolytes  Recent Labs Lab 07/12/16 1239  CALCIUM 6.7*    CBC  Recent Labs Lab 07/12/16 1239  WBC 18.9*  HGB 4.8*  HCT 14.7*  PLT 340    Coag's  Recent  Labs Lab 07/12/16 1239  INR 5.27*    Sepsis Markers  Recent Labs Lab 07/12/16 1321 07/12/16 1420 07/12/16 1754  LATICACIDVEN 2.07* 3.22* 1.41    ABG No results for input(s): PHART, PCO2ART, PO2ART in the last 168 hours.  Liver Enzymes  Recent Labs Lab 07/12/16 1239  AST 32  ALT 11*  ALKPHOS 41  BILITOT 1.3*  ALBUMIN 2.0*    Cardiac Enzymes No results for input(s): TROPONINI, PROBNP in the last 168 hours.  Glucose No results for input(s): GLUCAP in the last 168 hours.  Imaging Dg Chest Portable 1 View  Result Date: 07/12/2016 CLINICAL DATA:  Central line placement.  Initial encounter. EXAM: PORTABLE CHEST 1 VIEW COMPARISON:  Chest radiograph performed earlier today at 1:05 p.m. FINDINGS: The patient's right IJ line is noted ending about the mid to distal SVC. The lungs are mildly hypoexpanded. Mild vascular crowding and vascular congestion are seen. No pleural effusion or pneumothorax is identified. The cardiomediastinal silhouette remains normal in size. No acute osseous abnormalities are identified. IMPRESSION: 1. Right IJ line noted ending about the mid to distal SVC. 2. Lungs mildly hypoexpanded but grossly clear. Mild vascular congestion noted. Electronically Signed   By: Garald Balding M.D.   On: 07/12/2016 17:45   Dg Chest [facility] 1 View  Result  Date: 07/12/2016 CLINICAL DATA:  Shortness of breath today.  Hypertension. EXAM: PORTABLE CHEST 1 VIEW COMPARISON:  05/07/2016 FINDINGS: Normal heart, mediastinum and hila. Lungs are clear. No pleural effusion or pneumothorax. Skeletal structures are unremarkable. IMPRESSION: No active disease. Electronically Signed   By: Lajean Manes M.D.   On: 07/12/2016 13:25   Ct Renal Stone Study  Result Date: 07/12/2016 CLINICAL DATA:  Sepsis, nephrostomy tubes, follow, Ensure per pre placement, history diabetes mellitus, multiple sclerosis, acute on chronic renal failure, stroke, former smoker EXAM: CT ABDOMEN AND PELVIS WITHOUT  CONTRAST TECHNIQUE: Multidetector CT imaging of the abdomen and pelvis was performed following the standard protocol without IV contrast. Sagittal and coronal MPR images reconstructed from axial data set. GI contrast was not administered. Beam hardening artifacts in the upper abdomen from inclusion of the patient's arms in the imaged field COMPARISON:  None. FINDINGS: Lower chest: Minimal dependent bibasilar atelectasis. Hepatobiliary: Gallbladder and liver grossly unremarkable Pancreas: Normal appearance Spleen: Normal appearance Adrenals/Urinary Tract: Thickened adrenal glands without mass. BILATERALnephrostomy tubes present at the renal pelves. Large cyst lateral LEFT kidney 3.3 x 2.6 cm image 38. Mild LEFT renal collecting system dilatation. Moderate RIGHT renal collecting system dilatation. Nonobstructing LEFT renal calculi, 17 mm upper pole coronal image 80 and 18 mm renal pelvis coronal image 77. Dilatation of RIGHT ureter to the bladder. LEFT ureter decompressed with unchanged visualization of a 10 mm LEFT ureteral calculus image 77. No definite RIGHT ureteral calculus. Diffusely thickened and irregular bladder wall with small RIGHT lateral bladder diverticulum. Stomach/Bowel: Normal appendix. Thickened sigmoid colon with infiltrative changes of the sigmoid mesocolon, favor inflammatory process such as sigmoid colitis or diverticulitis. 13 mm diameter fluid levelcollection anterior to the urinary bladder, uncertain if represents a tiny bladder diverticulum containing air or a tiny extraluminal collection arising from a sigmoid colonic inflammatory process. Stomach and remaining bowel loops unremarkable. Vascular/Lymphatic: Atherosclerotic calcification without aneurysm. Upper normal size retroperitoneal nodes without abdominal or pelvic adenopathy. Reproductive: Unremarkable uterus and ovaries. Other: No free air or ascites.  No hernia. Musculoskeletal: Osseous demineralization diffusely. IMPRESSION:  Expected position of the pigtails of the BILATERAL nephrostomy tubes within the renal collecting systems, with RIGHT greater than LEFT collecting system dilatation of the kidneys. Nonobstructing LEFT renal calculi and 10 mm distal LEFT ureteral calculus. Thick irregular bladder wall with small bladder diverticulum, question of cystitis raised. Thickened segment of sigmoid colon with pericolic inflammatory changes question due to diverticulitis or colitis. 13 x 16 mm diameter extraluminal fat/fluid level anterior to the urinary bladder, nonspecific. Electronically Signed   By: Lavonia Dana M.D.   On: 07/12/2016 15:44     STUDIES:  CT abdomen January 30: Bilateral nephrostomy tubes in place, bilateral renal calculi, thick irregular bladder wall question cystitis, thickening of sigmoid colon, question colitis.   CULTURES: Blood cultures 07/12/2016 Urine culture 07/12/2016  ANTIBIOTICS: Vancomycin 07/12/2016 Zosyn 07/12/2016 Vancomycin oral 07/12/2016  SIGNIFICANT EVENTS:   LINES/TUBES: Right internal jugular central venous line 07/12/2016  DISCUSSION: 56 year old chronically ill female with a past medical history significant for multiple sclerosis and DVT on Xarelto presented to the emergency department with fever and confusion. She appears to be septic and has a low hemoglobin.  Source of sepsis likely UTI given her history but we don't have a urinalysis yet to support this.  Given the time of year she could have the flu.  ASSESSMENT / PLAN:  PULMONARY A: Aspiration risk P:   Monitor in ICU NPO  CARDIOVASCULAR A:  Septic  shock Hemorrhagic shock P:  CVP monitoring Levophed for MAP > 65 Transfuse blood now Tele Gentle IVF with NS 75%/hour F/u repeat lactic acid and BP post transfusion of blood, serial CBC May need repeat saline bolus depending on response to blood  RENAL A:   AKI P:   Monitor BMET and UOP Replace electrolytes as needed   GASTROINTESTINAL A:    Hemoccult positive stool, not briskly bleeding Diarrhea by report P:   Monitor for melena PPI IV NPO  HEMATOLOGIC A:   Anemia DVT Coagulopathy from Xarelto P:  Hold Xarelto Transfuse blood now May need IVC filter Serial CBC No SCD with DVT  INFECTIOUS A:   Septic shock Likely cystitis c diff?  P:   F/u cultures Sent urinalysis now Add c diff pcr Oral vancomycin IV vanc/zosyn  ENDOCRINE A:   No acute issues   P:   Monitor glucose  NEUROLOGIC A:   MS Acute encephalopathy from sepsis P:   No sedating medications   FAMILY  - Updates: None bedside  Presumably full code given no family here, but this needs to be clarified given her chronic illnesses.  - Inter-disciplinary family meet or Palliative Care meeting due by: day 7  My cc time 40 minutes  Roselie Awkward, MD [facility] Pager: [phone] Cell: [phone] After 3pm or if no response, call [phone]  07/12/2016, 6:27 PM

## 2016-07-12 NOTE — Progress Notes (Signed)
Pharmacy Antibiotic Note  [name] is a 56 y.o. female admitted on 07/12/2016 with sepsis.  Pharmacy has been consulted for Vanc/Zosyn dosing.  Plan: Vancomycin 1g x 1 then 1g IV every 24 hours.  Goal trough 15-20 mcg/mL. Zosyn 3.375g IV q8h (4 hour infusion).  Weight: 145 lb 1 oz (65.8 kg)  Temp (24hrs), Avg:103.3 F (39.6 C), Min:103.3 F (39.6 C), Max:103.3 F (39.6 C)   Recent Labs Lab 07/12/16 1239 07/12/16 1321  CREATININE 1.75*  --   LATICACIDVEN  --  2.07*    Estimated Creatinine Clearance: 33.2 mL/min (by C-G formula based on SCr of 1.75 mg/dL (H)).    No Known Allergies    Thank you for allowing pharmacy to be a part of this patient's care.   Adrian Saran, PharmD, BCPS Pager [phone] 07/12/2016 2:10 PM

## 2016-07-13 LAB — BASIC METABOLIC PANEL
ANION GAP: 7 (ref 5–15)
BUN: 44 mg/dL — ABNORMAL HIGH (ref 6–20)
CO2: 20 mmol/L — ABNORMAL LOW (ref 22–32)
Calcium: 8.9 mg/dL (ref 8.9–10.3)
Chloride: 113 mmol/L — ABNORMAL HIGH (ref 101–111)
Creatinine, Ser: 1.78 mg/dL — ABNORMAL HIGH (ref 0.44–1.00)
GFR, EST AFRICAN AMERICAN: 35 mL/min — AB (ref >60)
GFR, EST NON AFRICAN AMERICAN: 31 mL/min — AB (ref >60)
GLUCOSE: 96 mg/dL (ref 65–99)
POTASSIUM: 3.6 mmol/L (ref 3.5–5.1)
Sodium: 140 mmol/L (ref 135–145)

## 2016-07-13 LAB — BLOOD CULTURE ID PANEL (REFLEXED)
Acinetobacter baumannii: NOT DETECTED
CANDIDA GLABRATA: NOT DETECTED
CANDIDA KRUSEI: NOT DETECTED
CANDIDA TROPICALIS: NOT DETECTED
Candida albicans: NOT DETECTED
Candida parapsilosis: NOT DETECTED
ENTEROBACTER CLOACAE COMPLEX: NOT DETECTED
ESCHERICHIA COLI: NOT DETECTED
Enterobacteriaceae species: NOT DETECTED
Enterococcus species: NOT DETECTED
Haemophilus influenzae: NOT DETECTED
KLEBSIELLA PNEUMONIAE: NOT DETECTED
Klebsiella oxytoca: NOT DETECTED
LISTERIA MONOCYTOGENES: NOT DETECTED
Methicillin resistance: NOT DETECTED
Neisseria meningitidis: NOT DETECTED
Proteus species: NOT DETECTED
Pseudomonas aeruginosa: NOT DETECTED
STREPTOCOCCUS AGALACTIAE: NOT DETECTED
Serratia marcescens: NOT DETECTED
Staphylococcus aureus (BCID): NOT DETECTED
Staphylococcus species: DETECTED — AB
Streptococcus pneumoniae: NOT DETECTED
Streptococcus pyogenes: NOT DETECTED
Streptococcus species: NOT DETECTED

## 2016-07-13 MED ORDER — SODIUM CHLORIDE 0.9 % IV BOLUS (SEPSIS)
1000.0000 mL | Freq: Once | INTRAVENOUS | Status: AC
Start: 2016-07-13 — End: 2016-07-13
  Administered 2016-07-13: 1000 mL via INTRAVENOUS

## 2016-07-13 MED ORDER — HYDROCERIN EX CREA
TOPICAL_CREAM | Freq: Two times a day (BID) | CUTANEOUS | Status: DC
  Administered 2016-07-13 – 2016-07-14 (×2): via TOPICAL
  Administered 2016-07-14: 1 via TOPICAL
  Administered 2016-07-15 (×2): via TOPICAL
  Administered 2016-07-16: 1 via TOPICAL
  Administered 2016-07-16: 22:00:00 via TOPICAL
  Administered 2016-07-17: 1 via TOPICAL
  Administered 2016-07-18: 22:00:00 via TOPICAL
  Administered 2016-07-18: 1 via TOPICAL
  Administered 2016-07-19 – 2016-07-20 (×3): via TOPICAL
  Administered 2016-07-20: 1 via TOPICAL
  Administered 2016-07-21 – 2016-07-28 (×15): via TOPICAL
  Filled 2016-07-13 (×2): qty 113

## 2016-07-13 MED ORDER — SODIUM CHLORIDE 0.9 % IV BOLUS (SEPSIS)
500.0000 mL | Freq: Once | INTRAVENOUS | Status: AC
  Administered 2016-07-13: 500 mL via INTRAVENOUS

## 2016-07-13 NOTE — Progress Notes (Signed)
PHARMACY - PHYSICIAN COMMUNICATION CRITICAL VALUE ALERT - BLOOD CULTURE IDENTIFICATION (BCID)  Results for orders placed or performed during the hospital encounter of 07/12/16  Blood Culture ID Panel (Reflexed) (Collected: 07/12/2016  1:13 PM)  Result Value Ref Range   Enterococcus species NOT DETECTED NOT DETECTED   Listeria monocytogenes NOT DETECTED NOT DETECTED   Staphylococcus species DETECTED (A) NOT DETECTED   Staphylococcus aureus NOT DETECTED NOT DETECTED   Methicillin resistance NOT DETECTED NOT DETECTED   Streptococcus species NOT DETECTED NOT DETECTED   Streptococcus agalactiae NOT DETECTED NOT DETECTED   Streptococcus pneumoniae NOT DETECTED NOT DETECTED   Streptococcus pyogenes NOT DETECTED NOT DETECTED   Acinetobacter baumannii NOT DETECTED NOT DETECTED   Enterobacteriaceae species NOT DETECTED NOT DETECTED   Enterobacter cloacae complex NOT DETECTED NOT DETECTED   Escherichia coli NOT DETECTED NOT DETECTED   Klebsiella oxytoca NOT DETECTED NOT DETECTED   Klebsiella pneumoniae NOT DETECTED NOT DETECTED   Proteus species NOT DETECTED NOT DETECTED   Serratia marcescens NOT DETECTED NOT DETECTED   Haemophilus influenzae NOT DETECTED NOT DETECTED   Neisseria meningitidis NOT DETECTED NOT DETECTED   Pseudomonas aeruginosa NOT DETECTED NOT DETECTED   Candida albicans NOT DETECTED NOT DETECTED   Candida glabrata NOT DETECTED NOT DETECTED   Candida krusei NOT DETECTED NOT DETECTED   Candida parapsilosis NOT DETECTED NOT DETECTED   Candida tropicalis NOT DETECTED NOT DETECTED    Name of physician (or Provider) Contacted: Dr. Mortimer Fries  Changes to prescribed antibiotics required: None, Continue Vancomycin and Zosyn.  Gretta Arab PharmD, BCPS Pager [phone] 07/13/2016 3:17 PM

## 2016-07-13 NOTE — Progress Notes (Signed)
[place] Progress Note Patient Name: LESETTE BUSEY DOB: 06-02-1961 MRN: [id number]   Date of Service  07/13/2016  HPI/Events of Note  Called by prarm +blood culture coag neg staph  eICU Interventions  Continue current abx, most likely contaminant        Flora Lipps 07/13/2016, 3:19 PM

## 2016-07-13 NOTE — NC FL2 (Addendum)
[place] LEVEL OF CARE SCREENING TOOL     IDENTIFICATION  Patient Name: [name] Birthdate: September 17, 1960 Sex: female Admission Date (Current Location): 07/12/2016  [facility] and [place] Number:  Herbalist and Address:   [facility],  [place] [address] [place]      Provider Number: [id number]  Attending Physician Name and Address:  Juanito Doom, MD  Relative Name and Phone Number:       Current Level of Care: Hospital Recommended Level of Care: [place] Prior Approval Number:    Date Approved/Denied:   PASRR Number:   [id number]  Discharge Plan: SNF    Current Diagnoses: Patient Active Problem List   Diagnosis Date Noted  . Hydronephrosis   . Acute respiratory failure with hypoxia ([place])   . Pelvic abscess in female 03/17/2016  . Septic shock ([place]) 03/08/2016  . Encounter for central line placement   . Arterial hypotension   . Neuropathic pain 01/11/2016  . Attention to nephrostomy ([place]) 01/11/2016  . Acute renal failure ([place])   . Partial small bowel obstruction 12/24/2015  . Bacteremia due to coagulase-negative Staphylococcus 12/22/2015  . Left nephrolithiasis 12/22/2015  . Gram-negative bacteremia 12/22/2015  . Palliative care encounter   . SIRS (systemic inflammatory response syndrome) ([place]) 12/18/2015  . Acute pyelonephritis   . Pressure ulcer   . Controlled diabetes mellitus type 2 with complications ([place])   . Neurogenic bladder 11/11/2015  . Physical deconditioning 11/11/2015  . Chronic indwelling Foley catheter 09/30/2015  . Hypokalemia 09/04/2015  . Dyslipidemia associated with type 2 diabetes mellitus ([place]) 08/28/2015  . GERD without esophagitis 08/28/2015  . Acute renal failure superimposed on stage 3 chronic kidney disease ([place]) 07/21/2015  . Leukocytosis 07/21/2015  . Anemia 07/21/2015  . Hyperkalemia 07/21/2015  . Urinary tract infection without hematuria 07/21/2015  .  Malnutrition of moderate degree 07/21/2015  . DVT, lower extremity ([place]) 12/28/2014  . Multiple sclerosis diagnosed 2002-not on Therapy any longer 06/26/2012    Orientation RESPIRATION BLADDER Height & Weight     Self, Time, Situation, Place  Normal Super Pubic Catherer Weight: 138 lb 14.2 oz (63 kg) Height:  5\' 5"  (165.1 cm)  BEHAVIORAL SYMPTOMS/MOOD NEUROLOGICAL BOWEL NUTRITION STATUS      Continent Diet (regular)  AMBULATORY STATUS COMMUNICATION OF NEEDS Skin   Limited Assist Verbally Other (Comment) (Ulcer-right foot 2)                       Personal Care Assistance Level of Assistance  Bathing, Feeding, Dressing Bathing Assistance: Limited assistance Feeding assistance: Independent Dressing Assistance: Limited assistance     Functional Limitations Info             SPECIAL CARE FACTORS FREQUENCY  PT (By licensed PT), OT (By licensed OT)     PT Frequency: 5 OT Frequency: 5            Contractures      Additional Factors Info  Code Status, Allergies Code Status Info: Full Code Allergies Info: NKA           Current Medications (07/13/2016):  This is the current hospital active medication list Current Facility-Administered Medications  Medication Dose Route Frequency Provider Last Rate Last Dose  . 0.9 %  sodium chloride infusion   Intravenous Continuous Juanito Doom, MD 75 mL/hr at 07/13/16 1000    . acetaminophen (TYLENOL) tablet 650 mg  650 mg Oral Q4H PRN  Juanito Doom, MD      . albuterol (PROVENTIL) (2.5 MG/3ML) 0.083% nebulizer solution 2.5 mg  2.5 mg Nebulization Q2H PRN Juanito Doom, MD      . norepinephrine (LEVOPHED) 4 mg in dextrose 5 % 250 mL (0.016 mg/mL) infusion  2-50 mcg/min Intravenous Continuous Juanito Doom, MD 7.5 mL/hr at 07/13/16 1000 2 mcg/min at 07/13/16 1000  . ondansetron (ZOFRAN) injection 4 mg  4 mg Intravenous Q6H PRN Juanito Doom, MD      . pantoprazole (PROTONIX) injection 40 mg  40 mg Intravenous  QHS Juanito Doom, MD   40 mg at 07/12/16 2235  . piperacillin-tazobactam (ZOSYN) IVPB 3.375 g  3.375 g Intravenous Q8H Baltazar Najjar [place], RPH   3.375 g at 07/13/16 1458  . vancomycin (VANCOCIN) 50 mg/mL oral solution 500 mg  500 mg Oral Q6H Juanito Doom, MD   500 mg at 07/13/16 1327  . vancomycin (VANCOCIN) IVPB 1000 mg/200 mL premix  1,000 mg Intravenous Q24H Thomes Lolling, RPH   1,000 mg at 07/13/16 1458     Discharge Medications: Please see discharge summary for a list of discharge medications.  Relevant Imaging Results:  Relevant Lab Results:   Additional Information SS#:[id number]  Weston Anna, LCSW

## 2016-07-13 NOTE — Progress Notes (Signed)
PULMONARY / CRITICAL CARE MEDICINE   Name: NIJAY MCAMIS MRN: [id number] DOB: 03-Dec-1960    ADMISSION DATE:  07/12/2016 CONSULTATION DATE:  07/13/2016  REFERRING MD:  Dr. Theora Gianotti  CHIEF COMPLAINT:  Fever  HISTORY OF PRESENT ILLNESS:   This is a 56 year old female with a past medical history significant for multiple sclerosis, has a history of DVT on Xarelto and was brought To the [place] emergency department on 07/12/2016 with a history of fever. The patient was encephalopathic on my exam and could provide no further history. History is obtained by reviewing the records. Records that she recently had bilateral obstructing nephrolithiasis and underwent bilateral percutaneous nephrostomy tubes placed in December 2017. She also has a history of DVT and takes Xarelto. In our emergency department she was noted to be febrile to 103, hypotensive, have a lactic acidosis, and hemoglobin of 4. Pulmonary critical care medicine was consulted for further evaluation. In the emergency department she has not passed blood but she was noted be Hemoccult positive. Despite receiving 2-4 L of saline (it's unclear if this time if it was 2 or 4) she remains hypotensive with a pressure in the 70s.    SUBJECTIVE:  Dull affect  VITAL SIGNS: BP (!) 100/54 (BP Location: Right Arm)   Pulse 97   Temp 99.6 F (37.6 C) (Oral)   Resp 20   Ht 5\' 5"  (1.651 m)   Wt 138 lb 14.2 oz (63 kg)   SpO2 100%   BMI 23.11 kg/m   HEMODYNAMICS: CVP:  [2 mmHg-5 mmHg] 2 mmHg  VENTILATOR SETTINGS:    INTAKE / OUTPUT: I/O last 3 completed shifts: In: 5390.6 [I.V.:1034.6; Blood:1106; IV Piggyback:3250] Out: [id number] [Urine:1455]  PHYSICAL EXAMINATION: General:  Chronically ill appearing female, lying in bed Neuro:  Arouses to touch, follow simple commands, will give one-word answers. Dull affect HEENT:  Normocephalic atraumatic mucous membranes dry Cardiovascular:  Regular rate and rhythm, no clear murmurs gallops or  rubs Lungs:  Clear to auscultation bilaterally with normal effort, diminished bs bases Abdomen:  Belly soft, nontender nondistended, left nephrostomy tube and rt side dressing Musculoskeletal:  Diminished bulk and tone bilaterally in keeping with known diagnosis of multiple sclerosis Skin:  Lower ext blackened with chronic venous stasis (WOC evaluating) LABS:  BMET  Recent Labs Lab 07/12/16 1239 07/13/16 0220  NA 142 140  K 4.1 3.6  CL 121* 113*  CO2 16* 20*  BUN 36* 44*  CREATININE 1.75* 1.78*  GLUCOSE 84 96    Electrolytes  Recent Labs Lab 07/12/16 1239 07/13/16 0220  CALCIUM 6.7* 8.9    CBC  Recent Labs Lab 07/12/16 1239 07/13/16 0220  WBC 18.9* 17.7*  HGB 4.8* 10.1*  HCT 14.7* 30.4*  PLT 340 295    Coag's  Recent Labs Lab 07/12/16 1239  INR 5.27*    Sepsis Markers  Recent Labs Lab 07/12/16 1420 07/12/16 1754 07/12/16 2104  LATICACIDVEN 3.22* 1.41 1.1    ABG No results for input(s): PHART, PCO2ART, PO2ART in the last 168 hours.  Liver Enzymes  Recent Labs Lab 07/12/16 1239  AST 32  ALT 11*  ALKPHOS 41  BILITOT 1.3*  ALBUMIN 2.0*    Cardiac Enzymes No results for input(s): TROPONINI, PROBNP in the last 168 hours.  Glucose No results for input(s): GLUCAP in the last 168 hours.  Imaging Dg Chest Portable 1 View  Result Date: 07/12/2016 CLINICAL DATA:  Central line placement.  Initial encounter. EXAM: PORTABLE CHEST 1 VIEW COMPARISON:  Chest radiograph performed earlier today at 1:05 p.m. FINDINGS: The patient's right IJ line is noted ending about the mid to distal SVC. The lungs are mildly hypoexpanded. Mild vascular crowding and vascular congestion are seen. No pleural effusion or pneumothorax is identified. The cardiomediastinal silhouette remains normal in size. No acute osseous abnormalities are identified. IMPRESSION: 1. Right IJ line noted ending about the mid to distal SVC. 2. Lungs mildly hypoexpanded but grossly clear. Mild  vascular congestion noted. Electronically Signed   By: Garald Balding M.D.   On: 07/12/2016 17:45   Dg Chest Port 1 View  Result Date: 07/12/2016 CLINICAL DATA:  Shortness of breath today.  Hypertension. EXAM: PORTABLE CHEST 1 VIEW COMPARISON:  05/07/2016 FINDINGS: Normal heart, mediastinum and hila. Lungs are clear. No pleural effusion or pneumothorax. Skeletal structures are unremarkable. IMPRESSION: No active disease. Electronically Signed   By: Lajean Manes M.D.   On: 07/12/2016 13:25   Ct Renal Stone Study  Result Date: 07/12/2016 CLINICAL DATA:  Sepsis, nephrostomy tubes, follow, Ensure per pre placement, history diabetes mellitus, multiple sclerosis, acute on chronic renal failure, stroke, former smoker EXAM: CT ABDOMEN AND PELVIS WITHOUT CONTRAST TECHNIQUE: Multidetector CT imaging of the abdomen and pelvis was performed following the standard protocol without IV contrast. Sagittal and coronal MPR images reconstructed from axial data set. GI contrast was not administered. Beam hardening artifacts in the upper abdomen from inclusion of the patient's arms in the imaged field COMPARISON:  None. FINDINGS: Lower chest: Minimal dependent bibasilar atelectasis. Hepatobiliary: Gallbladder and liver grossly unremarkable Pancreas: Normal appearance Spleen: Normal appearance Adrenals/Urinary Tract: Thickened adrenal glands without mass. BILATERALnephrostomy tubes present at the renal pelves. Large cyst lateral LEFT kidney 3.3 x 2.6 cm image 38. Mild LEFT renal collecting system dilatation. Moderate RIGHT renal collecting system dilatation. Nonobstructing LEFT renal calculi, 17 mm upper pole coronal image 80 and 18 mm renal pelvis coronal image 77. Dilatation of RIGHT ureter to the bladder. LEFT ureter decompressed with unchanged visualization of a 10 mm LEFT ureteral calculus image 77. No definite RIGHT ureteral calculus. Diffusely thickened and irregular bladder wall with small RIGHT lateral bladder  diverticulum. Stomach/Bowel: Normal appendix. Thickened sigmoid colon with infiltrative changes of the sigmoid mesocolon, favor inflammatory process such as sigmoid colitis or diverticulitis. 13 mm diameter fluid levelcollection anterior to the urinary bladder, uncertain if represents a tiny bladder diverticulum containing air or a tiny extraluminal collection arising from a sigmoid colonic inflammatory process. Stomach and remaining bowel loops unremarkable. Vascular/Lymphatic: Atherosclerotic calcification without aneurysm. Upper normal size retroperitoneal nodes without abdominal or pelvic adenopathy. Reproductive: Unremarkable uterus and ovaries. Other: No free air or ascites.  No hernia. Musculoskeletal: Osseous demineralization diffusely. IMPRESSION: Expected position of the pigtails of the BILATERAL nephrostomy tubes within the renal collecting systems, with RIGHT greater than LEFT collecting system dilatation of the kidneys. Nonobstructing LEFT renal calculi and 10 mm distal LEFT ureteral calculus. Thick irregular bladder wall with small bladder diverticulum, question of cystitis raised. Thickened segment of sigmoid colon with pericolic inflammatory changes question due to diverticulitis or colitis. 13 x 16 mm diameter extraluminal fat/fluid level anterior to the urinary bladder, nonspecific. Electronically Signed   By: Lavonia Dana M.D.   On: 07/12/2016 15:44     STUDIES:  CT abdomen January 30: Bilateral nephrostomy tubes in place, bilateral renal calculi, thick irregular bladder wall question cystitis, thickening of sigmoid colon, question colitis.   CULTURES: Blood cultures 07/12/2016>> Urine culture 07/12/2016>>  ANTIBIOTICS: Vancomycin 07/12/2016>> Zosyn 07/12/2016>> Vancomycin oral  07/12/2016>>  SIGNIFICANT EVENTS:   LINES/TUBES: Right internal jugular central venous line 07/12/2016>>  DISCUSSION: 56 year old chronically ill female with a past medical history significant for  multiple sclerosis and DVT on Xarelto presented to the emergency department with fever and confusion. She appears to be septic and has a low hemoglobin.  Source of sepsis likely UTI given her history but we don't have a urinalysis yet to support this.  Given the time of year she could have the flu.  ASSESSMENT / PLAN:  PULMONARY A: Aspiration risk P:   Monitor in ICU Advance diet  CARDIOVASCULAR A:  Septic shock Hemorrhagic shock P:  CVP monitoring Levophed for MAP > 65 Transfused blood 1/30 Tele Gentle IVF with NS 75%/hour, cvp remains low 1/30, add 500 cc ns bolus F/u repeat lactic acid and BP post transfusion of blood, serial CBC 1/31 repeat saline bolus now hgb restored  RENAL Lab Results  Component Value Date   CREATININE 1.78 (H) 07/13/2016   CREATININE 1.75 (H) 07/12/2016   CREATININE 0.7 05/23/2016   CREATININE 0.53 05/16/2016    Recent Labs Lab 07/12/16 1239 07/13/16 0220  K 4.1 3.6     A:   AKI P:   Monitor BMET and UOP Replace electrolytes as needed Fluid resuscitation    GASTROINTESTINAL A:   Hemoccult positive stool, not briskly bleeding Diarrhea by report, 1/31 no stools since admit P:   Monitor for melena PPI IV NPO  HEMATOLOGIC  Recent Labs  07/12/16 1239 07/13/16 0220  HGB 4.8* 10.1*    A:   Anemia DVT Coagulopathy from Xarelto P:  Hold Xarelto Transfused blood 1/30 May need IVC filter Serial CBC No SCD with DVT and chronic statis of lower ext  INFECTIOUS A:   Septic shock Likely cystitis c diff?  P:   F/u cultures Sent urinalysis 1/30 Add c diff pcr 1/30(1/31 no stools) Oral vancomycin 1/30 IV vanc/zosyn 1/30  ENDOCRINE   A:   No acute issues   P:   Monitor glucose on labs  NEUROLOGIC A:   MS Acute encephalopathy from sepsis Dull affect P:   No sedating medications   FAMILY  - Updates: None bedside  Presumably full code given no family here, but this needs to be clarified given her chronic  illnesses. 1/30 no family at bedside  - Inter-disciplinary family meet or Palliative Care meeting due by: day 7  cct 30 min   Richardson Landry Deonna Krummel ACNP [facility] PCCM Pager [phone] till 3 pm If no answer page [phone] 07/13/2016, 8:55 AM

## 2016-07-13 NOTE — Progress Notes (Signed)
[facility] Progress Note Patient Name: [name] DOB: February 21, 1961 MRN: [id number]   Date of Service  07/13/2016  HPI/Events of Note  Hypovolemia - Patient on Norepinephrine IV infusion for hemodynamic support. CVP = 3-5.  eICU Interventions  Will order: 1. Bolus with 0.9 NaCl 1 liter IV over 1 hour now.      Intervention Category Major Interventions: Hypovolemia - evaluation and treatment with fluids;Hypotension - evaluation and management  Sommer,Steven Cornelia Copa 07/13/2016, 12:11 AM

## 2016-07-14 ENCOUNTER — Inpatient Hospital Stay (HOSPITAL_COMMUNITY): Payer: Medicare Other

## 2016-07-14 LAB — CBC
HCT: 27.5 % — ABNORMAL LOW (ref 36.0–46.0)
HEMATOCRIT: 30.4 % — AB (ref 36.0–46.0)
HEMOGLOBIN: 9.5 g/dL — AB (ref 12.0–15.0)
Hemoglobin: 10.1 g/dL — ABNORMAL LOW (ref 12.0–15.0)
MCH: 29.6 pg (ref 26.0–34.0)
MCH: 30.3 pg (ref 26.0–34.0)
MCHC: 33.2 g/dL (ref 30.0–36.0)
MCHC: 34.5 g/dL (ref 30.0–36.0)
MCV: 89.1 fL (ref 78.0–100.0)
MCV: 87.6 fL (ref 78.0–100.0)
PLATELETS: 295 10*3/uL (ref 150–400)
Platelets: 297 10*3/uL (ref 150–400)
RBC: 3.41 MIL/uL — AB (ref 3.87–5.11)
RBC: 3.14 MIL/uL — ABNORMAL LOW (ref 3.87–5.11)
RDW: 15.8 % — ABNORMAL HIGH (ref 11.5–15.5)
RDW: 16.6 % — AB (ref 11.5–15.5)
WBC: 17.7 10*3/uL — ABNORMAL HIGH (ref 4.0–10.5)
WBC: 16.6 10*3/uL — ABNORMAL HIGH (ref 4.0–10.5)

## 2016-07-14 LAB — BASIC METABOLIC PANEL
Anion gap: 7 (ref 5–15)
BUN: 22 mg/dL — ABNORMAL HIGH (ref 6–20)
CALCIUM: 8.9 mg/dL (ref 8.9–10.3)
CO2: 19 mmol/L — ABNORMAL LOW (ref 22–32)
Chloride: 114 mmol/L — ABNORMAL HIGH (ref 101–111)
Creatinine, Ser: 1.13 mg/dL — ABNORMAL HIGH (ref 0.44–1.00)
GFR calc Af Amer: >60 mL/min (ref >60)
GFR calc non Af Amer: 53 mL/min — ABNORMAL LOW (ref >60)
Glucose, Bld: 107 mg/dL — ABNORMAL HIGH (ref 65–99)
Potassium: 3 mmol/L — ABNORMAL LOW (ref 3.5–5.1)
SODIUM: 140 mmol/L (ref 135–145)

## 2016-07-14 LAB — URINE CULTURE

## 2016-07-14 MED ORDER — SODIUM CHLORIDE 0.9 % IV SOLN
30.0000 meq | Freq: Once | INTRAVENOUS | Status: AC
  Administered 2016-07-14: 30 meq via INTRAVENOUS
  Filled 2016-07-14: qty 15

## 2016-07-14 MED ORDER — FAMOTIDINE 20 MG PO TABS
20.0000 mg | ORAL_TABLET | Freq: Two times a day (BID) | ORAL | Status: DC
  Administered 2016-07-14 – 2016-07-28 (×28): 20 mg via ORAL
  Filled 2016-07-14 (×29): qty 1

## 2016-07-14 MED ORDER — ATORVASTATIN CALCIUM 10 MG PO TABS
10.0000 mg | ORAL_TABLET | Freq: Every day | ORAL | Status: DC
  Administered 2016-07-14 – 2016-07-27 (×14): 10 mg via ORAL
  Filled 2016-07-14 (×14): qty 1

## 2016-07-14 NOTE — Progress Notes (Signed)
PULMONARY / CRITICAL CARE MEDICINE   Name: [name] MRN: [id number] DOB: [birth year]    ADMISSION DATE:  07/12/2016 CONSULTATION DATE:  07/14/2016  REFERRING MD:  Dr. Theora Gianotti  CHIEF COMPLAINT:  Fever  HISTORY OF PRESENT ILLNESS:   This is a 56 year old female with a past medical history significant for multiple sclerosis, has a history of DVT on Xarelto and was brought To the [place] emergency department on 07/12/2016 with a history of fever. The patient was encephalopathic on my exam and could provide no further history. History is obtained by reviewing the records. Records that she recently had bilateral obstructing nephrolithiasis and underwent bilateral percutaneous nephrostomy tubes placed in December 2017. She also has a history of DVT and takes Xarelto. In our emergency department she was noted to be febrile to 103, hypotensive, have a lactic acidosis, and hemoglobin of 4. Pulmonary critical care medicine was consulted for further evaluation. In the emergency department she has not passed blood but she was noted be Hemoccult positive. Despite receiving 2-4 L of saline (it's unclear if this time if it was 2 or 4) she remains hypotensive with a pressure in the 70s.    SUBJECTIVE:  Dull affect  VITAL SIGNS: BP (!) 111/58   Pulse (!) 102   Temp 98.9 F (37.2 C) (Oral)   Resp (!) 22   Ht 5\' 5"  (1.651 m)   Wt 143 lb 4.8 oz (65 kg)   SpO2 100%   BMI 23.85 kg/m   HEMODYNAMICS: CVP:  [1 mmHg-10 mmHg] 9 mmHg  VENTILATOR SETTINGS:    INTAKE / OUTPUT: I/O last 3 completed shifts: In: 5911.9 [P.O.:480; I.V.:2992.6; Blood:1106; IV Piggyback:1333.3] Out: 2775 [Urine:2775]  PHYSICAL EXAMINATION: General:  Chronically ill appearing female, lying in bed Neuro:  Arouses to touch, follow simple commands, will give one-word answers. Dull affect but more interactive HEENT:  Normocephalic atraumatic mucous membranes dry Cardiovascular:  Regular rate and rhythm, no clear murmurs  gallops or rubs, off pressors Lungs:  Clear to auscultation bilaterally with normal effort, diminished bs bases Abdomen:  Belly soft, nontender nondistended, left nephrostomy tube and rt side dressing Musculoskeletal:  Diminished bulk and tone bilaterally in keeping with known diagnosis of multiple sclerosis Skin:  Lower ext blackened with chronic venous stasis (WOC evaluating) LABS:  BMET  Recent Labs Lab 07/12/16 1239 07/13/16 0220 07/14/16 0404  NA 142 140 140  K 4.1 3.6 3.0*  CL 121* 113* 114*  CO2 16* 20* 19*  BUN 36* 44* 22*  CREATININE 1.75* 1.78* 1.13*  GLUCOSE 84 96 107*    Electrolytes  Recent Labs Lab 07/12/16 1239 07/13/16 0220 07/14/16 0404  CALCIUM 6.7* 8.9 8.9    CBC  Recent Labs Lab 07/12/16 1239 07/13/16 0220 07/14/16 0404  WBC 18.9* 17.7* 16.6*  HGB 4.8* 10.1* 9.5*  HCT 14.7* 30.4* 27.5*  PLT 340 295 297    Coag's  Recent Labs Lab 07/12/16 1239  INR 5.27*    Sepsis Markers  Recent Labs Lab 07/12/16 1420 07/12/16 1754 07/12/16 2104  LATICACIDVEN 3.22* 1.41 1.1    ABG No results for input(s): PHART, PCO2ART, PO2ART in the last 168 hours.  Liver Enzymes  Recent Labs Lab 07/12/16 1239  AST 32  ALT 11*  ALKPHOS 41  BILITOT 1.3*  ALBUMIN 2.0*    Cardiac Enzymes No results for input(s): TROPONINI, PROBNP in the last 168 hours.  Glucose No results for input(s): GLUCAP in the last 168 hours.  Imaging Dg Chest [facility]  1 View  Result Date: 07/14/2016 CLINICAL DATA:  Respiratory failure. EXAM: PORTABLE CHEST 1 VIEW COMPARISON:  07/12/2016 . FINDINGS: Right IJ In stable position. Heart size stable. Bibasilar subsegmental atelectasis. No pleural effusion or pneumothorax. IMPRESSION: 1. Right IJ line stable position. 2. Mild bibasilar subsegmental atelectasis. Electronically Signed   By: Marcello Moores  Register   On: 07/14/2016 07:15     STUDIES:  CT abdomen January 30: Bilateral nephrostomy tubes in place, bilateral renal calculi,  thick irregular bladder wall question cystitis, thickening of sigmoid colon, question colitis.   CULTURES: Blood cultures 07/12/2016>>staph>> Urine culture 07/12/2016>>  ANTIBIOTICS: Vancomycin 07/12/2016>> Zosyn 07/12/2016>> Vancomycin oral 07/12/2016>>  SIGNIFICANT EVENTS: bc +staph>>  LINES/TUBES: Right internal jugular central venous line 07/12/2016>>  DISCUSSION: 56 year old chronically ill female with a past medical history significant for multiple sclerosis and DVT on Xarelto presented to the emergency department with fever and confusion. She appears to be septic and has a low hemoglobin.  Source of sepsis likely UTI given her history but we don't have a urinalysis yet to support this.  Given the time of year she could have the flu.  ASSESSMENT / PLAN:  PULMONARY A: Aspiration risk P:   Monitor in ICU Advance diet  CARDIOVASCULAR A:  Septic shock Hemorrhagic shock P:  CVP monitoring Levophed for MAP > 65, off 2/1 Transfused blood 1/30 Tele Gentle IVF with NS 75%/hour, cvp up to 6  2/1, pressors off F/u repeat lactic acid and BP post transfusion of blood, serial CBC 1/31 repeat saline bolus now hgb restored  RENAL Lab Results  Component Value Date   CREATININE 1.13 (H) 07/14/2016   CREATININE 1.78 (H) 07/13/2016   CREATININE 1.75 (H) 07/12/2016    Recent Labs Lab 07/12/16 1239 07/13/16 0220 07/14/16 0404  K 4.1 3.6 3.0*     A:   AKI P:   Monitor BMET and UOP Replace electrolytes as needed Fluid resuscitation    GASTROINTESTINAL A:   Hemoccult positive stool, not briskly bleeding Diarrhea by report, 1/31 no stools since admit P:   Monitor for melena PPI IV NPO consider feeds  HEMATOLOGIC  Recent Labs  07/13/16 0220 07/14/16 0404  HGB 10.1* 9.5*    A:   Anemia DVT Coagulopathy from Xarelto P:  Hold Xarelto Transfused blood 1/30 May need IVC filter Serial CBC No SCD with DVT and chronic statis of lower  ext  INFECTIOUS A:   Septic shock Likely cystitis c diff?  P:   F/u cultures Sent urinalysis 1/30 Add c diff pcr 1/30(1/31 no stools) Oral vancomycin 1/30 IV vanc/zosyn 1/30  ENDOCRINE   A:   No acute issues   P:   Monitor glucose on labs  NEUROLOGIC A:   MS Acute encephalopathy from sepsis Dull affect, better 2/1 P:   No sedating medications   FAMILY  - Updates: None bedside  Presumably full code given no family here, but this needs to be clarified given her chronic illnesses. 1/31 no family at bedside  - Inter-disciplinary family meet or Palliative Care meeting due by: day 7  cct 30 min   Richardson Landry Minor ACNP [facility] PCCM Pager [phone] till 3 pm If no answer page [phone] 07/14/2016, 8:20 AM

## 2016-07-15 ENCOUNTER — Inpatient Hospital Stay (HOSPITAL_COMMUNITY): Payer: Medicare Other

## 2016-07-15 LAB — PROTIME-INR
INR: 1.16
Prothrombin Time: 14.8 seconds (ref 11.4–15.2)

## 2016-07-15 LAB — GLUCOSE, CAPILLARY
GLUCOSE-CAPILLARY: 152 mg/dL — AB (ref 65–99)
GLUCOSE-CAPILLARY: 88 mg/dL (ref 65–99)
GLUCOSE-CAPILLARY: 92 mg/dL (ref 65–99)
Glucose-Capillary: 100 mg/dL — ABNORMAL HIGH (ref 65–99)

## 2016-07-15 LAB — BASIC METABOLIC PANEL
ANION GAP: 6 (ref 5–15)
BUN: 14 mg/dL (ref 6–20)
CALCIUM: 9.3 mg/dL (ref 8.9–10.3)
CO2: 21 mmol/L — ABNORMAL LOW (ref 22–32)
Chloride: 113 mmol/L — ABNORMAL HIGH (ref 101–111)
Creatinine, Ser: 0.88 mg/dL (ref 0.44–1.00)
GFR calc non Af Amer: >60 mL/min (ref >60)
GLUCOSE: 92 mg/dL (ref 65–99)
Potassium: 3.9 mmol/L (ref 3.5–5.1)
Sodium: 140 mmol/L (ref 135–145)

## 2016-07-15 LAB — IRON AND TIBC
Iron: 9 ug/dL — ABNORMAL LOW (ref 28–170)
SATURATION RATIOS: 7 % — AB (ref 10.4–31.8)
TIBC: 129 ug/dL — ABNORMAL LOW (ref 250–450)
UIBC: 120 ug/dL

## 2016-07-15 LAB — CBC
HCT: 24.8 % — ABNORMAL LOW (ref 36.0–46.0)
Hemoglobin: 8.4 g/dL — ABNORMAL LOW (ref 12.0–15.0)
MCH: 29.8 pg (ref 26.0–34.0)
MCHC: 33.9 g/dL (ref 30.0–36.0)
MCV: 87.9 fL (ref 78.0–100.0)
PLATELETS: 285 10*3/uL (ref 150–400)
RBC: 2.82 MIL/uL — ABNORMAL LOW (ref 3.87–5.11)
RDW: 16.6 % — AB (ref 11.5–15.5)
WBC: 19.6 10*3/uL — AB (ref 4.0–10.5)

## 2016-07-15 LAB — PHOSPHORUS: Phosphorus: 2.6 mg/dL (ref 2.5–4.6)

## 2016-07-15 LAB — SAVE SMEAR

## 2016-07-15 LAB — FERRITIN: FERRITIN: 1113 ng/mL — AB (ref 11–307)

## 2016-07-15 LAB — FIBRINOGEN

## 2016-07-15 LAB — MAGNESIUM: Magnesium: 1.7 mg/dL (ref 1.7–2.4)

## 2016-07-15 LAB — LACTATE DEHYDROGENASE: LDH: 117 U/L (ref 98–192)

## 2016-07-15 LAB — VITAMIN B12: Vitamin B-12: 2254 pg/mL — ABNORMAL HIGH (ref 180–914)

## 2016-07-15 MED ORDER — INSULIN ASPART 100 UNIT/ML ~~LOC~~ SOLN
0.0000 [IU] | Freq: Three times a day (TID) | SUBCUTANEOUS | Status: DC
  Administered 2016-07-15 – 2016-07-19 (×2): 2 [IU] via SUBCUTANEOUS
  Administered 2016-07-20 – 2016-07-21 (×3): 1 [IU] via SUBCUTANEOUS
  Administered 2016-07-21: 3 [IU] via SUBCUTANEOUS
  Administered 2016-07-22: 2 [IU] via SUBCUTANEOUS
  Administered 2016-07-22: 1 [IU] via SUBCUTANEOUS
  Administered 2016-07-23: 3 [IU] via SUBCUTANEOUS
  Administered 2016-07-23: 1 [IU] via SUBCUTANEOUS
  Administered 2016-07-24: 3 [IU] via SUBCUTANEOUS
  Administered 2016-07-25 – 2016-07-26 (×3): 1 [IU] via SUBCUTANEOUS
  Administered 2016-07-28: 2 [IU] via SUBCUTANEOUS

## 2016-07-15 MED ORDER — SODIUM CHLORIDE 0.9 % IJ SOLN
INTRAMUSCULAR | Status: AC
  Filled 2016-07-15: qty 50

## 2016-07-15 MED ORDER — IOPAMIDOL (ISOVUE-300) INJECTION 61%
INTRAVENOUS | Status: AC
  Filled 2016-07-15: qty 100

## 2016-07-15 MED ORDER — SODIUM CHLORIDE 0.9 % IV SOLN
INTRAVENOUS | Status: AC
  Filled 2016-07-15: qty 250

## 2016-07-15 MED ORDER — IOPAMIDOL (ISOVUE-300) INJECTION 61%
100.0000 mL | Freq: Once | INTRAVENOUS | Status: AC | PRN
  Administered 2016-07-15: 100 mL via INTRAVENOUS

## 2016-07-15 MED ORDER — IOPAMIDOL (ISOVUE-300) INJECTION 61%
INTRAVENOUS | Status: AC
  Administered 2016-07-15: 16:00:00
  Filled 2016-07-15: qty 30

## 2016-07-15 MED ORDER — IOPAMIDOL (ISOVUE-300) INJECTION 61%
30.0000 mL | Freq: Once | INTRAVENOUS | Status: AC
  Administered 2016-07-15: 30 mL via ORAL

## 2016-07-15 NOTE — Progress Notes (Signed)
PROGRESS NOTE    KRYSTALEE SHIBUYA  [id number] DOB: 1960/09/12 DOA: 07/12/2016 PCP: Blanchie Serve, MD     Brief Narrative:  ROSALENE WENZLICK is a 56 yo female with past medical history of multiple sclerosis, history of DVT on Xarelto, hx of bilateral obstructing nephrolithiasis s/p bilateral perc nephrostomy tube in July 2017 and December 2017, who presented to the emergency department due to fevers. She was encephalopathic at time of admission. In the emergency department, she had a fever of 103, hypotensive, lactic acidosis, and hemoglobin of 4. She was initially admitted to [facility] services, with presumed sepsis secondary to UTI. She was treated with IV antibiotics, given blood products. She did require pressors for blood pressure support   Assessment & Plan:   Active Problems:   Septic shock (HCC)  Shock, multifactorial -Septic as well as hemorrhagic -Initially thought source to be septic secondary to UTI. She was started on IV Vanco and Zosyn, but urine culture has now resulted with multiple organisms, UA does show large leuk, numerous WBC, many bacteria. Previous urine culture has shown pan-sensitive E Coli and Pseudomonas, resistant providencia  -CT renal stone on 1/30 with questionable cystitis, questionable diverticulitis or colitis  -Due to diarrhea, C. difficile PCR was ordered as well as oral vancomycin. However, before sample could be obtained, she stopped having diarrhea. Therefore PCR test, enteric precaution, oral vanco are stopped.  -Influenza negative  -Required levophed, her pressure is now stable now off pressors -Blood culture with 1 of 2 coag neg staph, likely contaminant  -Continues to have leukocytosis. Will check CT abd/pelvis  -Continue zosyn for now   Acute metabolic encephalopathy -Now resolved and at baseline  Acute normocytic anemia on chronic normocytic anemia -Baseline Hgb 8.2-9.5 -Unclear etiology, had hemoccult positive stool but no report of melena or  hematochezia to explain Hgb 4 on admission  -Received 2u pRBC on 1/30  -Iron studies are not consistent with iron deficiency anemia -Vit B 12: 2,254 -Folate pending  -INR now normal, LDH normal, fibrinogen elevated -Haptoglobin pending  -Continue to trend CBC, if Hgb drops further, will eval with tagged scan and possible GI consult if evidence of melena/hematochezia   AKI  -Baseline Cr 0.5-0.7, Cr was 1.75 on admission -Resolved with IVF   History of DVT -Xarelto was held on admission due to anemia -Currently on SCDs  Hx bilateral obstructing nephrolithiasis s/p nephrostomy tube  -Managed by IR outpatient   DM type 2 -Hold oral home meds  -Ha1c pending  -ISS  Essential HTN -Holding home antihypertensives until BP stablizes further    DVT prophylaxis: SCDs Code Status: Full Family Communication: no family at bedside Disposition Plan: pending further stabilization, improvement. PT to eval. Patient may transfer to med surg today    Consultants:   PCCM  Procedures:   None  Antimicrobials:  Anti-infectives    Start     Dose/Rate Route Frequency Ordered Stop   07/13/16 1400  vancomycin (VANCOCIN) IVPB 1000 mg/200 mL premix  Status:  Discontinued     1,000 mg 200 mL/hr over 60 Minutes Intravenous Every 24 hours 07/12/16 2058 07/14/16 1109   07/13/16 1200  vancomycin (VANCOCIN) IVPB 1000 mg/200 mL premix  Status:  Discontinued     1,000 mg 200 mL/hr over 60 Minutes Intravenous Every 24 hours 07/12/16 1412 07/12/16 2051   07/13/16 0000  vancomycin (VANCOCIN) 50 mg/mL oral solution 500 mg  Status:  Discontinued     500 mg Oral Every 6 hours 07/12/16 2051  07/15/16 1328   07/12/16 2200  piperacillin-tazobactam (ZOSYN) IVPB 3.375 g  Status:  Discontinued     3.375 g 12.5 mL/hr over 240 Minutes Intravenous Every 8 hours 07/12/16 1412 07/12/16 2051   07/12/16 2200  piperacillin-tazobactam (ZOSYN) IVPB 3.375 g     3.375 g 12.5 mL/hr over 240 Minutes Intravenous Every 8  hours 07/12/16 2058     07/12/16 1315  piperacillin-tazobactam (ZOSYN) IVPB 3.375 g     3.375 g 100 mL/hr over 30 Minutes Intravenous  Once 07/12/16 1314 07/12/16 1356   07/12/16 1315  vancomycin (VANCOCIN) IVPB 1000 mg/200 mL premix     1,000 mg 200 mL/hr over 60 Minutes Intravenous  Once 07/12/16 1314 07/12/16 1512        Subjective: Patient with no acute complaints today. Denies any chest pain, SOB. Diarrhea has now stopped. She tells me that she was quite fatigued prior to admission but cannot tell me more history regarding events leading up to her hospitalization. She denies any bright red blood in stool or black tarry stools prior to admission. No nausea or vomiting.    Objective: Vitals:   07/15/16 0900 07/15/16 1000 07/15/16 1100 07/15/16 1200  BP: (!) 96/48 (!) 105/58 (!) 96/57 (!) 106/52  Pulse: 87 89 90 87  Resp: (!) 24 (!) 26 (!) 23 (!) 21  Temp:    98.8 F (37.1 C)  TempSrc:    Oral  SpO2: 100% 100% 100% 100%  Weight:      Height:        Intake/Output Summary (Last 24 hours) at 07/15/16 1306 Last data filed at 07/15/16 1200  Gross per 24 hour  Intake             2400 ml  Output               50 ml  Net             2350 ml   Filed Weights   07/13/16 0500 07/14/16 0400 07/15/16 0600  Weight: 63 kg (138 lb 14.2 oz) 65 kg (143 lb 4.8 oz) 69.2 kg (152 lb 8.9 oz)    Examination:  General exam: Appears calm and comfortable  Respiratory system: Clear to auscultation. Respiratory effort normal. Cardiovascular system: S1 & S2 heard, RRR. No JVD, murmurs, rubs, gallops or clicks. No pedal edema. Gastrointestinal system: Abdomen is nondistended, soft. No organomegaly or masses felt. Normal bowel sounds heard. Gu: Bilateral nephrostomy tubes present, left draining to bag yellow urine and right side is clamped  Central nervous system: Alert and oriented. Nonfocal. +MS  Extremities: Symmetric Skin: No rashes, lesions or ulcers, +chronic bilateral feet with venous  stasis skin changes  Psychiatry: Judgement and insight appear normal. Mood & affect appropriate.   Data Reviewed: I have personally reviewed following labs and imaging studies  CBC:  Recent Labs Lab 07/12/16 1239 07/13/16 0220 07/14/16 0404 07/15/16 0654  WBC 18.9* 17.7* 16.6* 19.6*  NEUTROABS 16.5*  --   --   --   HGB 4.8* 10.1* 9.5* 8.4*  HCT 14.7* 30.4* 27.5* 24.8*  MCV 89.6 89.1 87.6 87.9  PLT 340 295 297 [id number]   Basic Metabolic Panel:  Recent Labs Lab 07/12/16 1239 07/13/16 0220 07/14/16 0404 07/15/16 0500  NA 142 140 140 140  K 4.1 3.6 3.0* 3.9  CL 121* 113* 114* 113*  CO2 16* 20* 19* 21*  GLUCOSE 84 96 107* 92  BUN 36* 44* 22* 14  CREATININE 1.75* 1.78* 1.13*  0.88  CALCIUM 6.7* 8.9 8.9 9.3  MG  --   --   --  1.7  PHOS  --   --   --  2.6   GFR: Estimated Creatinine Clearance: 68.9 mL/min (by C-G formula based on SCr of 0.88 mg/dL). Liver Function Tests:  Recent Labs Lab 07/12/16 1239  AST 32  ALT 11*  ALKPHOS 41  BILITOT 1.3*  PROT 5.2*  ALBUMIN 2.0*   No results for input(s): LIPASE, AMYLASE in the last 168 hours. No results for input(s): AMMONIA in the last 168 hours. Coagulation Profile:  Recent Labs Lab 07/12/16 1239 07/15/16 0845  INR 5.27* 1.16   Cardiac Enzymes: No results for input(s): CKTOTAL, CKMB, CKMBINDEX, TROPONINI in the last 168 hours. BNP (last 3 results) No results for input(s): PROBNP in the last 8760 hours. HbA1C: No results for input(s): HGBA1C in the last 72 hours. CBG:  Recent Labs Lab 07/15/16 0855 07/15/16 1233  GLUCAP 100* 152*   Lipid Profile: No results for input(s): CHOL, HDL, LDLCALC, TRIG, CHOLHDL, LDLDIRECT in the last 72 hours. Thyroid Function Tests: No results for input(s): TSH, T4TOTAL, FREET4, T3FREE, THYROIDAB in the last 72 hours. Anemia Panel:  Recent Labs  07/15/16 0845  VITAMINB12 2,254*  FERRITIN 1,113*  TIBC 129*  IRON 9*   Sepsis Labs:  Recent Labs Lab 07/12/16 1321  07/12/16 1420 07/12/16 1754 07/12/16 2104  LATICACIDVEN 2.07* 3.22* 1.41 1.1    Recent Results (from the past 240 hour(s))  Culture, blood (Routine x 2)     Status: Abnormal (Preliminary result)   Collection Time: 07/12/16  1:13 PM  Result Value Ref Range Status   Specimen Description BLOOD LEFT ANTECUBITAL  Final   Special Requests BOTTLES DRAWN AEROBIC AND ANAEROBIC 5CC  Final   Culture  Setup Time   Final    GRAM POSITIVE COCCI IN CLUSTERS ANAEROBIC BOTTLE ONLY CRITICAL RESULT CALLED TO, READ BACK BY AND VERIFIED WITH: C. Shade Pharm.D. 15:15 07/13/16 ([email or web address])    Culture (A)  Final    STAPHYLOCOCCUS SPECIES (COAGULASE NEGATIVE) THE SIGNIFICANCE OF ISOLATING THIS ORGANISM FROM A SINGLE SET OF BLOOD CULTURES WHEN MULTIPLE SETS ARE DRAWN IS UNCERTAIN. PLEASE NOTIFY THE MICROBIOLOGY DEPARTMENT WITHIN ONE WEEK IF SPECIATION AND SENSITIVITIES ARE REQUIRED. Performed at [facility], [place] [address] [place], [place] [address]    Report Status PENDING  Incomplete  Culture, blood (Routine x 2)     Status: None (Preliminary result)   Collection Time: 07/12/16  1:13 PM  Result Value Ref Range Status   Specimen Description BLOOD LEFT ANTECUBITAL  Final   Special Requests BOTTLES DRAWN AEROBIC AND ANAEROBIC 5CC  Final   Culture   Final    NO GROWTH 2 DAYS Performed at [facility], [place] [address], [place],  [address]    Report Status PENDING  Incomplete  Blood Culture ID Panel (Reflexed)     Status: Abnormal   Collection Time: 07/12/16  1:13 PM  Result Value Ref Range Status   Enterococcus species NOT DETECTED NOT DETECTED Final   Listeria monocytogenes NOT DETECTED NOT DETECTED Final   Staphylococcus species DETECTED (A) NOT DETECTED Final    Comment: Methicillin (oxacillin) susceptible coagulase negative staphylococcus. Possible blood culture contaminant (unless isolated from more than one blood culture draw or clinical case suggests pathogenicity). No antibiotic  treatment is indicated for blood  culture contaminants. CRITICAL RESULT CALLED TO, READ BACK BY AND VERIFIED WITH: C. Shade Pharm.D. 15:15 07/13/16 (  [email or web address])    Staphylococcus aureus NOT DETECTED NOT DETECTED Final   Methicillin resistance NOT DETECTED NOT DETECTED Final   Streptococcus species NOT DETECTED NOT DETECTED Final   Streptococcus agalactiae NOT DETECTED NOT DETECTED Final   Streptococcus pneumoniae NOT DETECTED NOT DETECTED Final   Streptococcus pyogenes NOT DETECTED NOT DETECTED Final   Acinetobacter baumannii NOT DETECTED NOT DETECTED Final   Enterobacteriaceae species NOT DETECTED NOT DETECTED Final   Enterobacter cloacae complex NOT DETECTED NOT DETECTED Final   Escherichia coli NOT DETECTED NOT DETECTED Final   Klebsiella oxytoca NOT DETECTED NOT DETECTED Final   Klebsiella pneumoniae NOT DETECTED NOT DETECTED Final   Proteus species NOT DETECTED NOT DETECTED Final   Serratia marcescens NOT DETECTED NOT DETECTED Final   Haemophilus influenzae NOT DETECTED NOT DETECTED Final   Neisseria meningitidis NOT DETECTED NOT DETECTED Final   Pseudomonas aeruginosa NOT DETECTED NOT DETECTED Final   Candida albicans NOT DETECTED NOT DETECTED Final   Candida glabrata NOT DETECTED NOT DETECTED Final   Candida krusei NOT DETECTED NOT DETECTED Final   Candida parapsilosis NOT DETECTED NOT DETECTED Final   Candida tropicalis NOT DETECTED NOT DETECTED Final    Comment: Performed at [facility], [place] [address] [place], [place] [address]  Urine culture     Status: Abnormal   Collection Time: 07/12/16  5:33 PM  Result Value Ref Range Status   Specimen Description OTHER  Final   Special Requests NONE  Final   Culture MULTIPLE SPECIES PRESENT, SUGGEST RECOLLECTION (A)  Final   Report Status 07/14/2016 FINAL  Final  MRSA PCR Screening     Status: None   Collection Time: 07/12/16  8:53 PM  Result Value Ref Range Status   MRSA by PCR NEGATIVE NEGATIVE Final    Comment:         The GeneXpert MRSA Assay (FDA approved for NASAL specimens only), is one component of a comprehensive MRSA colonization surveillance program. It is not intended to diagnose MRSA infection nor to guide or monitor treatment for MRSA infections.        Radiology Studies: Dg Chest Port 1 View  Result Date: 07/14/2016 CLINICAL DATA:  Respiratory failure. EXAM: PORTABLE CHEST 1 VIEW COMPARISON:  07/12/2016 . FINDINGS: Right IJ In stable position. Heart size stable. Bibasilar subsegmental atelectasis. No pleural effusion or pneumothorax. IMPRESSION: 1. Right IJ line stable position. 2. Mild bibasilar subsegmental atelectasis. Electronically Signed   By: Marcello Moores  Register   On: 07/14/2016 07:15      Scheduled Meds: . atorvastatin  10 mg Oral QHS  . famotidine  20 mg Oral BID  . hydrocerin   Topical BID  . insulin aspart  0-9 Units Subcutaneous TID WC  . piperacillin-tazobactam (ZOSYN)  IV  3.375 g Intravenous Q8H  . vancomycin  500 mg Oral Q6H   Continuous Infusions: . sodium chloride 75 mL/hr at 07/15/16 0600     LOS: 3 days    Time spent: 40 minutes   Dessa Phi, DO [facility] [email or web address] Password [email or web address] 07/15/2016, 1:06 PM

## 2016-07-15 NOTE — Progress Notes (Signed)
Pharmacy Antibiotic Note  [name] is a 56 y.o. female admitted on 07/12/2016 with sepsis.  Pharmacy has been consulted for Vanc/Zosyn dosing.  Today, 07/15/2016:  D4 abx  Afebrile  WBC remain elevated  No further reports of diarrhea since admission  Plan:  Continue Zosyn 3.375 g IV extended infusion q8 hr  Consider stopping PO vancomycin as C diff assay was unable to be collected d/t no diarrhea   Height: 5\' 5"  (165.1 cm) Weight: 152 lb 8.9 oz (69.2 kg) IBW/kg (Calculated) : 57  Temp (24hrs), Avg:99.6 F (37.6 C), Min:98.8 F (37.1 C), Max:100 F (37.8 C)   Recent Labs Lab 07/12/16 1239 07/12/16 1321 07/12/16 1420 07/12/16 1754 07/12/16 2104 07/13/16 0220 07/14/16 0404 07/15/16 0500 07/15/16 0654  WBC 18.9*  --   --   --   --  17.7* 16.6*  --  19.6*  CREATININE 1.75*  --   --   --   --  1.78* 1.13* 0.88  --   LATICACIDVEN  --  2.07* 3.22* 1.41 1.1  --   --   --   --     Estimated Creatinine Clearance: 68.9 mL/min (by C-G formula based on SCr of 0.88 mg/dL).    No Known Allergies   Antimicrobials this admission: 1/30 Vanc >> 2/1 1/30 Zosyn >> 1/30 PO Vanc >>  Microbiology results: 1/30 BCx: 1/2 GPC in clusters      1/31:  BCID: 1/2 MS-CoNS  --> Likely contaminant, No changes 1/30 UCx: multiple species, suggest recollection 1/30 influenza neg 1/30 MRSA screen: neg Cdiff: unable to be collected   Thank you for allowing pharmacy to be a part of this patient's care.   Reuel Boom, PharmD, BCPS Pager: [phone] 07/15/2016, 11:33 AM

## 2016-07-16 LAB — HEPATIC FUNCTION PANEL
ALBUMIN: 1.9 g/dL — AB (ref 3.5–5.0)
ALK PHOS: 136 U/L — AB (ref 38–126)
ALT: 47 U/L (ref 14–54)
AST: 30 U/L (ref 15–41)
BILIRUBIN TOTAL: 0.6 mg/dL (ref 0.3–1.2)
Bilirubin, Direct: 0.2 mg/dL (ref 0.1–0.5)
Indirect Bilirubin: 0.4 mg/dL (ref 0.3–0.9)
Total Protein: 6 g/dL — ABNORMAL LOW (ref 6.5–8.1)

## 2016-07-16 LAB — CBC WITH DIFFERENTIAL/PLATELET
BASOS ABS: 0 10*3/uL (ref 0.0–0.1)
Basophils Relative: 0 %
Eosinophils Absolute: 0.3 10*3/uL (ref 0.0–0.7)
Eosinophils Relative: 2 %
HCT: 23 % — ABNORMAL LOW (ref 36.0–46.0)
Hemoglobin: 7.9 g/dL — ABNORMAL LOW (ref 12.0–15.0)
LYMPHS ABS: 2 10*3/uL (ref 0.7–4.0)
Lymphocytes Relative: 12 %
MCH: 30.2 pg (ref 26.0–34.0)
MCHC: 34.3 g/dL (ref 30.0–36.0)
MCV: 87.8 fL (ref 78.0–100.0)
MONO ABS: 1.3 10*3/uL — AB (ref 0.1–1.0)
Monocytes Relative: 8 %
NEUTROS PCT: 78 %
Neutro Abs: 13.1 10*3/uL — ABNORMAL HIGH (ref 1.7–7.7)
PLATELETS: 310 10*3/uL (ref 150–400)
RBC: 2.62 MIL/uL — AB (ref 3.87–5.11)
RDW: 16.7 % — AB (ref 11.5–15.5)
WBC: 16.7 10*3/uL — AB (ref 4.0–10.5)

## 2016-07-16 LAB — HEMOGLOBIN A1C
Hgb A1c MFr Bld: 5.3 % (ref 4.8–5.6)
MEAN PLASMA GLUCOSE: 105 mg/dL

## 2016-07-16 LAB — BASIC METABOLIC PANEL
ANION GAP: 7 (ref 5–15)
BUN: 12 mg/dL (ref 6–20)
CO2: 21 mmol/L — AB (ref 22–32)
Calcium: 9.3 mg/dL (ref 8.9–10.3)
Chloride: 115 mmol/L — ABNORMAL HIGH (ref 101–111)
Creatinine, Ser: 1.01 mg/dL — ABNORMAL HIGH (ref 0.44–1.00)
GFR calc non Af Amer: >60 mL/min (ref >60)
Glucose, Bld: 109 mg/dL — ABNORMAL HIGH (ref 65–99)
Potassium: 3 mmol/L — ABNORMAL LOW (ref 3.5–5.1)
SODIUM: 143 mmol/L (ref 135–145)

## 2016-07-16 LAB — TYPE AND SCREEN
BLOOD PRODUCT EXPIRATION DATE: 201802102359
BLOOD PRODUCT EXPIRATION DATE: 201802102359
Blood Product Expiration Date: 201802102359
ISSUE DATE / TIME: 201801301610
ISSUE DATE / TIME: 201801302119
UNIT TYPE AND RH: 6200
UNIT TYPE AND RH: 6200
Unit Type and Rh: 6200

## 2016-07-16 LAB — GLUCOSE, CAPILLARY
Glucose-Capillary: 107 mg/dL — ABNORMAL HIGH (ref 65–99)
Glucose-Capillary: 172 mg/dL — ABNORMAL HIGH (ref 65–99)
Glucose-Capillary: 148 mg/dL — ABNORMAL HIGH (ref 65–99)
Glucose-Capillary: 141 mg/dL — ABNORMAL HIGH (ref 65–99)

## 2016-07-16 LAB — CULTURE, BLOOD (ROUTINE X 2)

## 2016-07-16 LAB — HAPTOGLOBIN: Haptoglobin: 308 mg/dL — ABNORMAL HIGH (ref 34–200)

## 2016-07-16 MED ORDER — SODIUM CHLORIDE 0.9% FLUSH
10.0000 mL | INTRAVENOUS | Status: DC | PRN
  Administered 2016-07-16 – 2016-07-17 (×2): 10 mL
  Administered 2016-07-19: 20 mL
  Administered 2016-07-20: 10 mL
  Administered 2016-07-20: 20 mL
  Administered 2016-07-21: 10 mL
  Administered 2016-07-21: 20 mL
  Administered 2016-07-24 (×2): 10 mL
  Administered 2016-07-25: 30 mL
  Administered 2016-07-26 – 2016-07-27 (×2): 10 mL
  Filled 2016-07-16 (×12): qty 40

## 2016-07-16 MED ORDER — POTASSIUM CHLORIDE CRYS ER 20 MEQ PO TBCR
40.0000 meq | EXTENDED_RELEASE_TABLET | ORAL | Status: AC
  Administered 2016-07-16 (×2): 40 meq via ORAL
  Filled 2016-07-16 (×2): qty 2

## 2016-07-16 MED ORDER — HYDROCODONE-HOMATROPINE 5-1.5 MG/5ML PO SYRP
5.0000 mL | ORAL_SOLUTION | Freq: Four times a day (QID) | ORAL | Status: DC | PRN
  Administered 2016-07-16 – 2016-07-23 (×10): 5 mL via ORAL
  Filled 2016-07-16 (×11): qty 5

## 2016-07-16 NOTE — Progress Notes (Signed)
[facility]:  [phone] General Surgery Consult Note   LOS: 4 days  POD -     Assessment/Plan: 1.  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  It appears this is more a primary bladder problem - though there has been the suggestion of possible GI tract involvement, this has never been proven.   Her chart is lengthy.  WBC - 16,700 - 07/16/2016  On Zosyn - 1/30 >>>  Discussed with Dr. Ronny Bacon (IR) - he suggested uncapping NT  --> consider upsizing NT from 10 to 12.  He thought that the suprabladder abscess is multiloculated and not amendable to perc drain at this time  Dr. Raynald Kemp (resident) is on call for urology  2.  Bilateral nephrostomy tubes with obstructing stones - last changed 07/07/2016  Followed by Dr. Diona Fanti  Right sided hydronephrosis  3.  Pelvic and retroperitoneal adenopathy 4.  Nonspecific 5 mm hypodensity in periphery of right lobe of liver 5,  Mural thrombus along distal abdominal aorta. 6.  Anemia  Hgb - 7.9 - 07/16/2016 7.  Hypokalemia 8   MS - wheelchair bound 9.  DM 8.  DVT prophylaxis - not on chemoprophylaxis      She was on xarelto   Active Problems:   Septic shock ([place])  Subjective:  Complex patient and poor historian.  She was brought to the ER on 07/12/2016 not feeling good since Thursday, 07/07/2016, she started running fever. She had decreased appetite and has had nothing to eat or drink today.  She was encephalopathic on admission.  She was admitted to the ICU with sepsis.  The most likely source was thought to be urinary.  She had a phlegmon noted on CT scan in 03/08/2016.  She was seen by our group, Dr. Harlow Asa did the initial consult.  CT cystogram on 03/15/2016 showed extra luminal leakage of contrast along the bladder dome which communicates with the measured 3.2 cm pelvic fluid collection. (seen by Dr. Tresa Moore  03/14/2016)  She had a cystoscopy by Dr. Diona Fanti on 03/16/2016.  Dr. Diona Fanti was worried about the process not originating from the bladder.  She was seen by Dr. Amedeo Plenty on 03/20/2016, but at the time, thought that colonoscopy would be difficult and not be helpful.  She was discharged on 03/20/2016 from that hospitalization.  On 06/09/2016 - she had an abdominal CT scan - Stable findings of neurogenic bladder. Irregular curvilinear soft tissue tract extending between the superior bladder dome and left transverse colon, cannot exclude a colovesical fistula. No free air or oral contrast within this soft tissue tract.  One of the difficult issues dealing with the patient is her inability for provide any history or context for her symptoms.  Social History: Unmarried and not children. Lives at [facility]. Her cousin, [name] [phone]), is her POA.    Objective:   Vitals:   07/15/16 1750 07/15/16 2140  BP: 115/62 (!) 120/57  Pulse: 88 92  Resp: 18   Temp: 99.3 F (37.4 C) 98.9 F (37.2 C)     Intake/Output from previous day:  02/02 0701 - 02/03 0700 In: 1798.8 [I.V.:1698.8; IV Piggyback:100] Out: 650 [Urine:650]  Intake/Output this shift:  Total I/O In: 20 [I.V.:10; Other:10] Out: 200 [Urine:200]   Physical Exam:   General: AA F who is alert.  She is not a good  historian and somewhat hard to understand.  She is eating regular food and tolerating this well   HEENT: Normal. Pupils equal. .   Lungs: Clear.   Abdomen: BS present.  In the suprapubic area and to the left of midline - she has pain and tenderness.  This corresponds to the "abscess" on CT scan.   Has foley in place.    Lab Results:    Recent Labs  07/15/16 0654 07/16/16 0749  WBC 19.6* 16.7*  HGB 8.4* 7.9*  HCT 24.8* 23.0*  PLT 285 310    BMET   Recent Labs  07/15/16 0500 07/16/16 0749  NA 140 143  K 3.9 3.0*  CL 113* 115*  CO2 21* 21*  GLUCOSE 92 109*  BUN 14 12  CREATININE 0.88 1.01*   CALCIUM 9.3 9.3    PT/INR   Recent Labs  07/15/16 0845  LABPROT 14.8  INR 1.16    ABG  No results for input(s): PHART, HCO3 in the last 72 hours.  Invalid input(s): PCO2, PO2   Studies/Results:  Ct Abdomen Pelvis W Contrast  Result Date: 07/15/2016 CLINICAL DATA:  Acute onset of fever, hypotension, lactic acidosis and decreased hemoglobin. Initial encounter. EXAM: CT ABDOMEN AND PELVIS WITH CONTRAST TECHNIQUE: Multidetector CT imaging of the abdomen and pelvis was performed using the standard protocol following bolus administration of intravenous contrast. CONTRAST:  13mL ISOVUE-300 IOPAMIDOL (ISOVUE-300) INJECTION 61% COMPARISON:  CT of the abdomen and pelvis from 07/12/2016 FINDINGS: Lower chest: Trace bilateral pleural effusions are noted, with associated atelectasis. Trace pericardial fluid remains within normal limits. Hepatobiliary: A 5 mm hypodensity is noted in the periphery of the right hepatic lobe. The liver is otherwise unremarkable. The gallbladder is relatively decompressed and grossly unremarkable in appearance. The common bile duct remains normal in caliber. Pancreas: The pancreas is within normal limits. Spleen: The spleen is unremarkable in appearance. Adrenals/Urinary Tract: There is moderate chronic right-sided hydronephrosis despite right-sided nephrostomy tube placement, similar in appearance to the prior study. The nephrostomy tubes are stable in appearance. Chronic inflammation is noted about the left renal pelvis. Multiple left renal cysts are seen. Nonspecific perinephric stranding and fluid are noted bilaterally. Evaluation for renal stones is limited given contrast in the left renal calyces. No obstructing ureteral stones are identified. Stomach/Bowel: The appendix is grossly unremarkable in appearance. The degree of wall thickening and soft tissue inflammation about the proximal sigmoid colon is mildly improved from the prior study. No definite mass was seen in this  region on earlier studies in 2017. This is concerning for acute diverticulitis, with diffuse surrounding soft tissue inflammation. New collections of fluid are seen tracking anterior to the bladder, with minimal associated foci of fat attenuation and diffuse surrounding soft tissue inflammation. Collections of fluid measure approximately 8.4 x 2.9 x 4.9 cm, and raise concern for evolving abscess. A colonic source is considered more likely. Minimal soft tissue inflammation is noted at the mid transverse colon, reflecting the infectious process within the pelvis. The small bowel is grossly unremarkable in appearance. The stomach is unremarkable. Vascular/Lymphatic: Minimal calcification is seen along the distal abdominal aorta and its branches. Mild mural thrombus is seen along the distal abdominal aorta. Prominent retroperitoneal nodes measure up to 1.6 cm in short axis. Prominent pelvic sidewall nodes measure up to 1.1 cm in short axis. This may reflect acute infection or possibly underlying metastatic disease. Reproductive: Marked soft tissue inflammation is noted about the anterior bladder, with multiple underlying bladder wall diverticula. A  Foley catheter is noted. Multiple uterine fibroids are seen. No suspicious adnexal masses are identified, though the ovaries are difficult to fully assess given the infectious process within the pelvis. Other: No additional soft tissue abnormalities are seen. Musculoskeletal: No acute osseous abnormalities are identified. The visualized musculature is unremarkable in appearance. IMPRESSION: 1. New collections of fluid tracking anterior to the bladder, measuring approximately 8.4 x 2.9 x 4.9 cm, with diffuse surrounding soft tissue inflammation and minimal associated foci of fat attenuation. This raises concern for evolving abscess within the anterior pelvis, in similar distribution to prior regions of phlegmon noted earlier in 2017. A colonic source is considered more likely.  2. Slight interval improvement in the appearance of wall thickening and soft tissue inflammation about the proximal sigmoid colon, still concerning for acute diverticulitis. Diffuse surrounding soft tissue inflammation again noted. 3. Moderate chronic right-sided hydronephrosis again noted, despite right-sided nephrostomy tube placement. The nephrostomy tubes are stable in appearance. Chronic inflammation about the left renal pelvis. 4. Prominent pelvic sidewall and retroperitoneal nodes, measuring up to 1.6 cm in short axis. This may reflect acute infection or possibly underlying metastatic disease. Further evaluation may be considered as deemed clinically appropriate. 5. Nonspecific 5 mm hypodensity in the periphery of the right hepatic lobe. 6. Trace bilateral pleural effusions, with associated atelectasis. 7. Mild mural thrombus along the distal abdominal aorta, without evidence of luminal narrowing. 8. Multiple uterine fibroids again noted. Electronically Signed   By: Garald Balding M.D.   On: 07/15/2016 18:11     Anti-infectives:   Anti-infectives    Start     Dose/Rate Route Frequency Ordered Stop   07/13/16 1400  vancomycin (VANCOCIN) IVPB 1000 mg/200 mL premix  Status:  Discontinued     1,000 mg 200 mL/hr over 60 Minutes Intravenous Every 24 hours 07/12/16 2058 07/14/16 1109   07/13/16 1200  vancomycin (VANCOCIN) IVPB 1000 mg/200 mL premix  Status:  Discontinued     1,000 mg 200 mL/hr over 60 Minutes Intravenous Every 24 hours 07/12/16 1412 07/12/16 2051   07/13/16 0000  vancomycin (VANCOCIN) 50 mg/mL oral solution 500 mg  Status:  Discontinued     500 mg Oral Every 6 hours 07/12/16 2051 07/15/16 1328   07/12/16 2200  piperacillin-tazobactam (ZOSYN) IVPB 3.375 g  Status:  Discontinued     3.375 g 12.5 mL/hr over 240 Minutes Intravenous Every 8 hours 07/12/16 1412 07/12/16 2051   07/12/16 2200  piperacillin-tazobactam (ZOSYN) IVPB 3.375 g     3.375 g 12.5 mL/hr over 240 Minutes  Intravenous Every 8 hours 07/12/16 2058     07/12/16 1315  piperacillin-tazobactam (ZOSYN) IVPB 3.375 g     3.375 g 100 mL/hr over 30 Minutes Intravenous  Once 07/12/16 1314 07/12/16 1356   07/12/16 1315  vancomycin (VANCOCIN) IVPB 1000 mg/200 mL premix     1,000 mg 200 mL/hr over 60 Minutes Intravenous  Once 07/12/16 1314 07/12/16 1512      Alphonsa Overall, MD, FACS Pager: [facility] Office: [phone] 07/16/2016

## 2016-07-16 NOTE — Progress Notes (Signed)
Chronic foley pt arrived w/ was leaking, MD made aware and orders received to d/c foley catheter.  Catheter had 24mL bulb and only 68mL could be pulled back. Bulb was completely deflated when catheter removed. Yellow, cloudy urine observed.  MD aware. Will continue to monitor. Kizzie Ide, RN

## 2016-07-16 NOTE — Evaluation (Signed)
Physical Therapy Evaluation-1x Patient Details Name: [name] MRN: [id number] DOB: October 03, 1960 Today's Date: 07/16/2016   History of Present Illness  56 yo female admitted with septic shock. Hx of MS, CKD III, DM, HTN, bil nephrostomy tubes, sacral decubitus, neuromuscular d/o of bladder, DVt.     Clinical Impression  On eval, pt was only agreeable to UE and LE assessment. At baseline, pt is total assist for mobility/ADLs (hoyer lift for OOB to chair). Pt does not have any acute PT needs. Recommend return to SNF. 1x eval. Will sign off.     Follow Up Recommendations SNF    Equipment Recommendations  None recommended by PT    Recommendations for Other Services       Precautions / Restrictions Precautions Precautions: Fall Restrictions Weight Bearing Restrictions: No      Mobility  Bed Mobility               General bed mobility comments: NT-pt not agreeable to moving around.   Transfers                 General transfer comment: NT-at baseline requires hoyer lift  Ambulation/Gait                Stairs            Wheelchair Mobility    Modified Rankin (Stroke Patients Only)       Balance                                             Pertinent Vitals/Pain Pain Assessment: Faces Faces Pain Scale: Hurts even more Pain Location: R UE with movement Pain Descriptors / Indicators: Grimacing;Guarding Pain Intervention(s): Limited activity within patient's tolerance    Home Living Family/patient expects to be discharged to:: Skilled nursing facility                 Additional Comments: hoyer lift to power chair at SNF    Prior Function                 Hand Dominance        Extremity/Trunk Assessment   Upper Extremity Assessment Upper Extremity Assessment: LUE deficits/detail;RUE deficits/detail RUE Deficits / Details: AAROM of shoulder, elbow. Strength ~2/5. Poor grip strength.  LUE Deficits /  Details: AAROM of shoulder, elbow. Strength ~3/5. Fair grip strength    Lower Extremity Assessment Lower Extremity Assessment: RLE deficits/detail;LLE deficits/detail RLE Deficits / Details: Strength 0/5 LLE Deficits / Details: Strength 0/5       Communication   Communication: No difficulties  Cognition Arousal/Alertness: Awake/alert Behavior During Therapy: WFL for tasks assessed/performed Overall Cognitive Status: Within Functional Limits for tasks assessed                      General Comments      Exercises     Assessment/Plan    PT Assessment Patent does not need any further PT services  PT Problem List            PT Treatment Interventions      PT Goals (Current goals can be found in the Care Plan section)  Acute Rehab PT Goals Patient Stated Goal: none stated PT Goal Formulation: All assessment and education complete, DC therapy    Frequency     Barriers to discharge  Co-evaluation               End of Session   Activity Tolerance: Patient tolerated treatment well Patient left: in bed;with call bell/phone within reach;with bed alarm set           Time: 1053-1101 PT Time Calculation (min) (ACUTE ONLY): 8 min   Charges:   PT Evaluation $PT Eval Low Complexity: 1 Procedure     PT G Codes:        Weston Anna, MPT Pager: [phone]

## 2016-07-16 NOTE — Consult Note (Signed)
Urology Consult   Physician requesting consult: Dr. Clifton James  Reason for consult: pelvic fluid collection  History of Present Illness: [name] is a 55 y.o. with a very complicated past medical history who is well known to Dr. Diona Fanti due to her history of nephrolithiasis and pelvic fluid collections. She has a history of multiple sclerosis with neurogenic bladder secondary to this.  She presented to the emergency department on 1/30 due to fevers. In the emergency department, she had a fever of 103, hypotensive, lactic acidosis, and hemoglobin of 4. She was admitted to the ICU with presumed sepsis secondary to UTI. She was started on IV Vanco and Zosyn, but urine culture resulted with multiple organisms. She is now on the floor, afebrile with stable BP. A CT scn yesterday showed new collections of fluid tracking anterior to the bladder, measuring approximately 8.4 x 2.9 x 4.9 cm, concerning for abscess. There was some wall thickening and soft tissue inflammation of proximal sigmoid colon, concerning for acute diverticulitis.   She has had pelvic fluid collections in the past with discussion of whether they were of GI or GU origin. CT cystogram on 03/15/2016 showed extra luminal leakage of contrast along the bladder dome which communicates with the measured 3.2 cm pelvic fluid collection. A cystoscopy soon after this by Dr. Diona Fanti showed an inflammatory process at the dome but no clear evidence of fistula. 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 She has been previously scheduled for left PCNL but this has been deferred to multiple admissions. Her nephrostomy tubes were last exchanged on 07/07/16. Her right nephrostomy tube had been capped due to no obstruction seen on nephrostogram.  Past Medical History:  Diagnosis Date  . Acute renal failure  superimposed on stage 3 chronic kidney disease ([place]) 07/21/2015  . Anemia    chronic   . Diabetes mellitus without complication ([place])   . Diabetic neuropathy ([place])   . DVT (deep venous thrombosis) ([place])   . DVT (deep venous thrombosis) ([place])   . GERD (gastroesophageal reflux disease)   . Hx of sepsis   . Hypertension   . Left nephrolithiasis   . Leukocytosis   . Lymph edema    chronic   . Malnutrition of moderate degree 07/21/2015  . Multiple sclerosis diagnosed 2002-not on Therapy any longer 06/26/2012  . Muscle weakness (generalized)   . Neuromuscular disorder ([place])   . Neuromuscular dysfunction of bladder   . Polyneuropathy ([place])   . Presence of indwelling urinary catheter   . Protein calorie malnutrition ([place])   . Stage 4 decubitus ulcer ([place]) 07/05/2015  . Stroke ([place])   . UTI (lower urinary tract infection)     Past Surgical History:  Procedure Laterality Date  . ESOPHAGOGASTRODUODENOSCOPY Left 01/02/2015   Procedure: ESOPHAGOGASTRODUODENOSCOPY (EGD);  Surgeon: Arta Silence, MD;  Location: [facility];  Service: Endoscopy;  Laterality: Left;  . HERNIA MESH REMOVAL    . IR GENERIC HISTORICAL  02/23/2016   IR NEPHROSTOMY EXCHANGE LEFT 02/23/2016 Sandi Mariscal, MD [facility]  . IR GENERIC HISTORICAL  04/19/2016   IR NEPHROSTOMY EXCHANGE LEFT 04/19/2016 Arne Cleveland, MD [facility]  . IR GENERIC HISTORICAL  05/13/2016   IR NEPHROSTOMY EXCHANGE LEFT 05/13/2016 Corrie Mckusick, DO [facility]  . IR GENERIC HISTORICAL  05/13/2016   IR NEPHROSTOMY PLACEMENT RIGHT 05/13/2016 Corrie Mckusick, DO [facility]  .  IR GENERIC HISTORICAL  07/07/2016   IR NEPHROSTOMY EXCHANGE RIGHT 07/07/2016 Jacqulynn Cadet, MD [facility]  . IR GENERIC HISTORICAL  07/07/2016   IR NEPHROSTOMY EXCHANGE LEFT 07/07/2016 Jacqulynn Cadet, MD [facility]  . NEPHROSTOMY TUBE PLACEMENT ([place] HX)    . TRANSURETHRAL RESECTION OF BLADDER TUMOR N/A 03/16/2016   Procedure: CYSTOSCOPY BLADDER BIOPSY;  Surgeon: Franchot Gallo, MD;  Location: [facility];  Service: Urology;  Laterality: N/A;  . TUBAL LIGATION    . tubes tided       Current Hospital Medications:  Scheduled Meds: . atorvastatin  10 mg Oral QHS  . famotidine  20 mg Oral BID  . hydrocerin   Topical BID  . insulin aspart  0-9 Units Subcutaneous TID WC  . piperacillin-tazobactam (ZOSYN)  IV  3.375 g Intravenous Q8H  . potassium chloride  40 mEq Oral Q4H   Continuous Infusions: . sodium chloride 75 mL/hr at 07/16/16 0548   PRN Meds:.acetaminophen, albuterol, ondansetron (ZOFRAN) IV, sodium chloride flush  Allergies: No Known Allergies  Family History  Problem Relation Age of Onset  . Diabetes Mother   . Alzheimer's disease Mother   . Diabetes Father   . Diabetes Sister   . Scoliosis Sister     Social History:  reports that she quit smoking about 19 months ago. Her smoking use included Cigarettes. She has a 20.00 pack-year smoking history. She has never used smokeless tobacco. She reports that she drinks about 3.0 oz of alcohol per week . She reports that she does not use drugs.  ROS: A complete review of systems was performed.  All systems are negative except for pertinent findings as noted.  Physical Exam:  Vital signs in last 24 hours: Temp:  [98.4 F (36.9 C)-99.3 F (37.4 C)] 99.1 F (37.3 C) (02/03 1313) Pulse Rate:  [81-92] 86 (02/03 1313) Resp:  [18-22] 18 (02/03 1313) BP: (111-124)/(57-65) 121/65 (02/03 1313) SpO2:  [99 %-100 %] 99 % (02/03 1313) Weight:  [68.6 kg (151 lb 3.8 oz)] 68.6 kg (151 lb 3.8 oz) (02/03 0931) Constitutional:  Alert and oriented, No acute distress Cardiovascular: Regular rate and rhythm Respiratory: Normal respiratory effort GI: Abdomen is soft, tender diffusely but point to periumbilical/left sided area when asked where hurts the most, nondistended, no abdominal masses GU: No CVA tenderness Neurologic: Grossly intact, no focal deficits Psychiatric: Normal mood and affect  Laboratory  Data:   Recent Labs  07/14/16 0404 07/15/16 0654 07/16/16 0749  WBC 16.6* 19.6* 16.7*  HGB 9.5* 8.4* 7.9*  HCT 27.5* 24.8* 23.0*  PLT 297 285 310     Recent Labs  07/14/16 0404 07/15/16 0500 07/16/16 0749  NA 140 140 143  K 3.0* 3.9 3.0*  CL 114* 113* 115*  GLUCOSE 107* 92 109*  BUN 22* 14 12  CALCIUM 8.9 9.3 9.3  CREATININE 1.13* 0.88 1.01*     Results for orders placed or performed during the hospital encounter of 07/12/16 (from the past 24 hour(s))  Glucose, capillary     Status: None   Collection Time: 07/15/16  4:29 PM  Result Value Ref Range   Glucose-Capillary 88 65 - 99 mg/dL   Comment 1 Notify RN    Comment 2 Document in Chart   Glucose, capillary     Status: None   Collection Time: 07/15/16  9:44 PM  Result Value Ref Range   Glucose-Capillary 92 65 - 99 mg/dL  Glucose, capillary     Status: Abnormal   Collection  Time: 07/16/16  7:37 AM  Result Value Ref Range   Glucose-Capillary 107 (H) 65 - 99 mg/dL  CBC with Differential/Platelet     Status: Abnormal   Collection Time: 07/16/16  7:49 AM  Result Value Ref Range   WBC 16.7 (H) 4.0 - 10.5 K/uL   RBC 2.62 (L) 3.87 - 5.11 MIL/uL   Hemoglobin 7.9 (L) 12.0 - 15.0 g/dL   HCT 23.0 (L) 36.0 - 46.0 %   MCV 87.8 78.0 - 100.0 fL   MCH 30.2 26.0 - 34.0 pg   MCHC 34.3 30.0 - 36.0 g/dL   RDW 16.7 (H) 11.5 - 15.5 %   Platelets 310 150 - 400 K/uL   Neutrophils Relative % 78 %   Lymphocytes Relative 12 %   Monocytes Relative 8 %   Eosinophils Relative 2 %   Basophils Relative 0 %   Neutro Abs 13.1 (H) 1.7 - 7.7 K/uL   Lymphs Abs 2.0 0.7 - 4.0 K/uL   Monocytes Absolute 1.3 (H) 0.1 - 1.0 K/uL   Eosinophils Absolute 0.3 0.0 - 0.7 K/uL   Basophils Absolute 0.0 0.0 - 0.1 K/uL   RBC Morphology POLYCHROMASIA PRESENT   Basic metabolic panel     Status: Abnormal   Collection Time: 07/16/16  7:49 AM  Result Value Ref Range   Sodium 143 135 - 145 mmol/L   Potassium 3.0 (L) 3.5 - 5.1 mmol/L   Chloride 115 (H) 101  - 111 mmol/L   CO2 21 (L) 22 - 32 mmol/L   Glucose, Bld 109 (H) 65 - 99 mg/dL   BUN 12 6 - 20 mg/dL   Creatinine, Ser 1.01 (H) 0.44 - 1.00 mg/dL   Calcium 9.3 8.9 - 10.3 mg/dL   GFR calc non Af Amer >60 >60 mL/min   GFR calc Af Amer >60 >60 mL/min   Anion gap 7 5 - 15  Hepatic function panel     Status: Abnormal   Collection Time: 07/16/16  7:49 AM  Result Value Ref Range   Total Protein 6.0 (L) 6.5 - 8.1 g/dL   Albumin 1.9 (L) 3.5 - 5.0 g/dL   AST 30 15 - 41 U/L   ALT 47 14 - 54 U/L   Alkaline Phosphatase 136 (H) 38 - 126 U/L   Total Bilirubin 0.6 0.3 - 1.2 mg/dL   Bilirubin, Direct 0.2 0.1 - 0.5 mg/dL   Indirect Bilirubin 0.4 0.3 - 0.9 mg/dL  Glucose, capillary     Status: Abnormal   Collection Time: 07/16/16 11:32 AM  Result Value Ref Range   Glucose-Capillary 172 (H) 65 - 99 mg/dL   Recent Results (from the past 240 hour(s))  Culture, blood (Routine x 2)     Status: Abnormal (Preliminary result)   Collection Time: 07/12/16  1:13 PM  Result Value Ref Range Status   Specimen Description BLOOD LEFT ANTECUBITAL  Final   Special Requests BOTTLES DRAWN AEROBIC AND ANAEROBIC 5CC  Final   Culture  Setup Time   Final    GRAM POSITIVE COCCI IN CLUSTERS ANAEROBIC BOTTLE ONLY CRITICAL RESULT CALLED TO, READ BACK BY AND VERIFIED WITH: C. Shade Pharm.D. 15:15 07/13/16 ([email or web address])    Culture (A)  Final    STAPHYLOCOCCUS SPECIES (COAGULASE NEGATIVE) THE SIGNIFICANCE OF ISOLATING THIS ORGANISM FROM A SINGLE SET OF BLOOD CULTURES WHEN MULTIPLE SETS ARE DRAWN IS UNCERTAIN. PLEASE NOTIFY THE MICROBIOLOGY DEPARTMENT WITHIN ONE WEEK IF SPECIATION AND SENSITIVITIES ARE REQUIRED. Performed at [facility], [address] [place], [place] [address]    Report Status PENDING  Incomplete  Culture, blood (Routine x 2)     Status: None (Preliminary result)   Collection Time: 07/12/16  1:13 PM  Result Value Ref Range Status   Specimen Description BLOOD LEFT ANTECUBITAL  Final   Special  Requests BOTTLES DRAWN AEROBIC AND ANAEROBIC 5CC  Final   Culture   Final    NO GROWTH 4 DAYS Performed at [facility], [place] [address], [place], [place] [address]    Report Status PENDING  Incomplete  Blood Culture ID Panel (Reflexed)     Status: Abnormal   Collection Time: 07/12/16  1:13 PM  Result Value Ref Range Status   Enterococcus species NOT DETECTED NOT DETECTED Final   Listeria monocytogenes NOT DETECTED NOT DETECTED Final   Staphylococcus species DETECTED (A) NOT DETECTED Final    Comment: Methicillin (oxacillin) susceptible coagulase negative staphylococcus. Possible blood culture contaminant (unless isolated from more than one blood culture draw or clinical case suggests pathogenicity). No antibiotic treatment is indicated for blood  culture contaminants. CRITICAL RESULT CALLED TO, READ BACK BY AND VERIFIED WITH: C. Shade Pharm.D. 15:15 07/13/16 ([email or web address])    Staphylococcus aureus NOT DETECTED NOT DETECTED Final   Methicillin resistance NOT DETECTED NOT DETECTED Final   Streptococcus species NOT DETECTED NOT DETECTED Final   Streptococcus agalactiae NOT DETECTED NOT DETECTED Final   Streptococcus pneumoniae NOT DETECTED NOT DETECTED Final   Streptococcus pyogenes NOT DETECTED NOT DETECTED Final   Acinetobacter baumannii NOT DETECTED NOT DETECTED Final   Enterobacteriaceae species NOT DETECTED NOT DETECTED Final   Enterobacter cloacae complex NOT DETECTED NOT DETECTED Final   Escherichia coli NOT DETECTED NOT DETECTED Final   Klebsiella oxytoca NOT DETECTED NOT DETECTED Final   Klebsiella pneumoniae NOT DETECTED NOT DETECTED Final   Proteus species NOT DETECTED NOT DETECTED Final   Serratia marcescens NOT DETECTED NOT DETECTED Final   Haemophilus influenzae NOT DETECTED NOT DETECTED Final   Neisseria meningitidis NOT DETECTED NOT DETECTED Final   Pseudomonas aeruginosa NOT DETECTED NOT DETECTED Final   Candida albicans NOT DETECTED NOT DETECTED Final   Candida  glabrata NOT DETECTED NOT DETECTED Final   Candida krusei NOT DETECTED NOT DETECTED Final   Candida parapsilosis NOT DETECTED NOT DETECTED Final   Candida tropicalis NOT DETECTED NOT DETECTED Final    Comment: Performed at [facility], [place] [address] [place],  [address]  Urine culture     Status: Abnormal   Collection Time: 07/12/16  5:33 PM  Result Value Ref Range Status   Specimen Description OTHER  Final   Special Requests NONE  Final   Culture MULTIPLE SPECIES PRESENT, SUGGEST RECOLLECTION (A)  Final   Report Status 07/14/2016 FINAL  Final  MRSA PCR Screening     Status: None   Collection Time: 07/12/16  8:53 PM  Result Value Ref Range Status   MRSA by PCR NEGATIVE NEGATIVE Final    Comment:        The GeneXpert MRSA Assay (FDA approved for NASAL specimens only), is one component of a comprehensive MRSA colonization surveillance program. It is not intended to diagnose MRSA infection nor to guide or monitor treatment for MRSA infections.     Renal Function:  Recent Labs  07/12/16 1239 07/13/16 0220 07/14/16 0404 07/15/16 0500 07/16/16 0749  CREATININE 1.75* 1.78* 1.13* 0.88 1.01*   Estimated Creatinine Clearance: 59.8 mL/min (by C-G formula based on SCr of  1.01 mg/dL (H)).  Radiologic Imaging: Ct Abdomen Pelvis W Contrast  Result Date: 07/15/2016 CLINICAL DATA:  Acute onset of fever, hypotension, lactic acidosis and decreased hemoglobin. Initial encounter. EXAM: CT ABDOMEN AND PELVIS WITH CONTRAST TECHNIQUE: Multidetector CT imaging of the abdomen and pelvis was performed using the standard protocol following bolus administration of intravenous contrast. CONTRAST:  148mL ISOVUE-300 IOPAMIDOL (ISOVUE-300) INJECTION 61% COMPARISON:  CT of the abdomen and pelvis from 07/12/2016 FINDINGS: Lower chest: Trace bilateral pleural effusions are noted, with associated atelectasis. Trace pericardial fluid remains within normal limits. Hepatobiliary: A 5 mm  hypodensity is noted in the periphery of the right hepatic lobe. The liver is otherwise unremarkable. The gallbladder is relatively decompressed and grossly unremarkable in appearance. The common bile duct remains normal in caliber. Pancreas: The pancreas is within normal limits. Spleen: The spleen is unremarkable in appearance. Adrenals/Urinary Tract: There is moderate chronic right-sided hydronephrosis despite right-sided nephrostomy tube placement, similar in appearance to the prior study. The nephrostomy tubes are stable in appearance. Chronic inflammation is noted about the left renal pelvis. Multiple left renal cysts are seen. Nonspecific perinephric stranding and fluid are noted bilaterally. Evaluation for renal stones is limited given contrast in the left renal calyces. No obstructing ureteral stones are identified. Stomach/Bowel: The appendix is grossly unremarkable in appearance. The degree of wall thickening and soft tissue inflammation about the proximal sigmoid colon is mildly improved from the prior study. No definite mass was seen in this region on earlier studies in 2017. This is concerning for acute diverticulitis, with diffuse surrounding soft tissue inflammation. New collections of fluid are seen tracking anterior to the bladder, with minimal associated foci of fat attenuation and diffuse surrounding soft tissue inflammation. Collections of fluid measure approximately 8.4 x 2.9 x 4.9 cm, and raise concern for evolving abscess. A colonic source is considered more likely. Minimal soft tissue inflammation is noted at the mid transverse colon, reflecting the infectious process within the pelvis. The small bowel is grossly unremarkable in appearance. The stomach is unremarkable. Vascular/Lymphatic: Minimal calcification is seen along the distal abdominal aorta and its branches. Mild mural thrombus is seen along the distal abdominal aorta. Prominent retroperitoneal nodes measure up to 1.6 cm in short  axis. Prominent pelvic sidewall nodes measure up to 1.1 cm in short axis. This may reflect acute infection or possibly underlying metastatic disease. Reproductive: Marked soft tissue inflammation is noted about the anterior bladder, with multiple underlying bladder wall diverticula. A Foley catheter is noted. Multiple uterine fibroids are seen. No suspicious adnexal masses are identified, though the ovaries are difficult to fully assess given the infectious process within the pelvis. Other: No additional soft tissue abnormalities are seen. Musculoskeletal: No acute osseous abnormalities are identified. The visualized musculature is unremarkable in appearance. IMPRESSION: 1. New collections of fluid tracking anterior to the bladder, measuring approximately 8.4 x 2.9 x 4.9 cm, with diffuse surrounding soft tissue inflammation and minimal associated foci of fat attenuation. This raises concern for evolving abscess within the anterior pelvis, in similar distribution to prior regions of phlegmon noted earlier in 2017. A colonic source is considered more likely. 2. Slight interval improvement in the appearance of wall thickening and soft tissue inflammation about the proximal sigmoid colon, still concerning for acute diverticulitis. Diffuse surrounding soft tissue inflammation again noted. 3. Moderate chronic right-sided hydronephrosis again noted, despite right-sided nephrostomy tube placement. The nephrostomy tubes are stable in appearance. Chronic inflammation about the left renal pelvis. 4. Prominent pelvic sidewall and retroperitoneal  nodes, measuring up to 1.6 cm in short axis. This may reflect acute infection or possibly underlying metastatic disease. Further evaluation may be considered as deemed clinically appropriate. 5. Nonspecific 5 mm hypodensity in the periphery of the right hepatic lobe. 6. Trace bilateral pleural effusions, with associated atelectasis. 7. Mild mural thrombus along the distal abdominal  aorta, without evidence of luminal narrowing. 8. Multiple uterine fibroids again noted. Electronically Signed   By: Garald Balding M.D.   On: 07/15/2016 18:11    I independently reviewed the above imaging studies.  Impression/Recommendation: 56 year old female with complication past medical history of MS, neurogenic bladder and likely pelvic abscesses, ?secondary to urinoma from bladder leak/fistula.  1) Multiloculated pelvic fluid collection - This likely represents a pelvic abscess vs urinoma based on prior imaging that shows a connection to the anterior dome of the bladder. This presentation is certainly unusual and the etiology of this is still very unclear. It does not sound like it is amenable to percutaneous drainage per Dr. Jennell Corner discussion with IR. Would recommend keeping both nephrostomy tubes and foley catheter to straight drainage for maximal diversion of urine away from the bladder, potentially allowing this connection to heal and collection to partially resolve. Her bladder appeared distended despite foley catheter on CT imaging consistent with possible catheter occlusion which may have contributed to enlargement/worsening of the pelvic collection.  2) Left staghorn calculus - will need to be treated by left PCNL at some point but makes sense to address other issues first. Can be managed with nephrostomy tube until then.  Pieter Partridge A Sukhu 07/16/2016, 2:04 PM

## 2016-07-16 NOTE — Clinical Social Work Note (Signed)
Patient transferred from 1241 to room 1504.  Current d/c plan is to return to [facility] when medically stable per MD.  CSW services will continue to follow and assist with stable.  Lorie Phenix. Pauline Good, [place] (weekend coverage)

## 2016-07-16 NOTE — Progress Notes (Signed)
PROGRESS NOTE    [name]  [id number] DOB: [birth year] DOA: 07/12/2016 PCP: Blanchie Serve, MD     Brief Narrative:  [name] is a 56 yo female with past medical history of multiple sclerosis, history of DVT on Xarelto, hx of bilateral obstructing nephrolithiasis s/p bilateral perc nephrostomy tube in July 2017 and December 2017, who presented to the emergency department due to fevers. She was encephalopathic at time of admission. In the emergency department, she had a fever of 103, hypotensive, lactic acidosis, and hemoglobin of 4. She was initially admitted to [facility] services, with presumed sepsis secondary to UTI. She was treated with IV antibiotics, given blood products. She did require pressors for blood pressure support   Assessment & Plan:   Active Problems:   Septic shock (HCC)  Shock, multifactorial -Septic as well as hemorrhagic -Required levophed, her pressure is now stable now off pressors -Initially thought source to be septic secondary to UTI. She was started on IV Vanco and Zosyn, but urine culture has now resulted with multiple organisms, UA does show large leuk, numerous WBC, many bacteria. Previous urine culture has shown pan-sensitive E Coli and Pseudomonas, resistant providencia  -CT renal stone on 1/30 with questionable cystitis, questionable diverticulitis or colitis  -Due to diarrhea, C. difficile PCR was ordered as well as oral vancomycin. However, before sample could be obtained, she stopped having diarrhea. Therefore PCR test, enteric precaution, oral vanco are stopped.  -Blood culture with 1 of 2 coag neg staph, likely contaminant  -Influenza negative  -CT abd/pelvis obtained yesterday due to continued leukocytosis, this showed abdominal abscess, ?diverticular etiology.  -General surgery consulted -Continue zosyn for now   Acute metabolic encephalopathy -Now resolved and at baseline  Acute normocytic anemia on chronic normocytic anemia -Baseline  Hgb 8.2-9.5 -Unclear etiology, had hemoccult positive stool but no report of melena or hematochezia to explain Hgb 4 on admission  -Received 2u pRBC on 1/30  -Iron studies are not consistent with iron deficiency anemia -INR now normal, LDH normal, fibrinogen elevated, Haptoglobin 308. Not consistent with hemolytic anemia or DIC  -Vit B 12: 2,254. Folate pending  -Continue to trend CBC, if Hgb drops further, will eval with tagged scan and possible GI consult if evidence of melena/hematochezia but thus far, no episodes of melena/hematochezia is reported  AKI  -Baseline Cr 0.5-0.7, Cr was 1.75 on admission -Resolved with IVF   History of DVT -Xarelto was held on admission due to anemia -Currently on SCDs  Hx bilateral obstructing nephrolithiasis s/p nephrostomy tube  -Managed by IR outpatient  -Asked RN to place right nephrostomy bag to drain urine   DM type 2, well controlled  -Hold oral home meds  -Ha1c 5.3 -ISS  Essential HTN -Holding home antihypertensives until BP stablizes further    DVT prophylaxis: SCDs Code Status: Full Family Communication: no family at bedside Disposition Plan: pending further stabilization, improvement. PT to eval   Consultants:   PCCM  Procedures:   None  Antimicrobials:  Anti-infectives    Start     Dose/Rate Route Frequency Ordered Stop   07/13/16 1400  vancomycin (VANCOCIN) IVPB 1000 mg/200 mL premix  Status:  Discontinued     1,000 mg 200 mL/hr over 60 Minutes Intravenous Every 24 hours 07/12/16 2058 07/14/16 1109   07/13/16 1200  vancomycin (VANCOCIN) IVPB 1000 mg/200 mL premix  Status:  Discontinued     1,000 mg 200 mL/hr over 60 Minutes Intravenous Every 24 hours 07/12/16 1412 07/12/16 2051  07/13/16 0000  vancomycin (VANCOCIN) 50 mg/mL oral solution 500 mg  Status:  Discontinued     500 mg Oral Every 6 hours 07/12/16 2051 07/15/16 1328   07/12/16 2200  piperacillin-tazobactam (ZOSYN) IVPB 3.375 g  Status:  Discontinued      3.375 g 12.5 mL/hr over 240 Minutes Intravenous Every 8 hours 07/12/16 1412 07/12/16 2051   07/12/16 2200  piperacillin-tazobactam (ZOSYN) IVPB 3.375 g     3.375 g 12.5 mL/hr over 240 Minutes Intravenous Every 8 hours 07/12/16 2058     07/12/16 1315  piperacillin-tazobactam (ZOSYN) IVPB 3.375 g     3.375 g 100 mL/hr over 30 Minutes Intravenous  Once 07/12/16 1314 07/12/16 1356   07/12/16 1315  vancomycin (VANCOCIN) IVPB 1000 mg/200 mL premix     1,000 mg 200 mL/hr over 60 Minutes Intravenous  Once 07/12/16 1314 07/12/16 1512       Subjective: Patient with no acute complaints today. She is quite a poor historian. She denies any complaints prior to admission, states that she was tired and had no appetite. She denies any abdominal pain, diarrhea, melena or hematochezia prior to admission. However, when abdomen is palpated, it is TTP.   Objective: Vitals:   07/15/16 1500 07/15/16 1600 07/15/16 1750 07/15/16 2140  BP: (!) 111/57 (!) 124/59 115/62 (!) 120/57  Pulse: 81 87 88 92  Resp: (!) 21 (!) 22 18   Temp:  98.4 F (36.9 C) 99.3 F (37.4 C) 98.9 F (37.2 C)  TempSrc:  Oral Oral Oral  SpO2: 100% 100% 100% 100%  Weight:      Height:        Intake/Output Summary (Last 24 hours) at 07/16/16 0816 Last data filed at 07/16/16 0757  Gross per 24 hour  Intake          1808.75 ml  Output              650 ml  Net          1158.75 ml   Filed Weights   07/13/16 0500 07/14/16 0400 07/15/16 0600  Weight: 63 kg (138 lb 14.2 oz) 65 kg (143 lb 4.8 oz) 69.2 kg (152 lb 8.9 oz)    Examination:  General exam: Appears calm and comfortable  Respiratory system: Clear to auscultation. Respiratory effort normal. Cardiovascular system: S1 & S2 heard, RRR. No JVD, murmurs, rubs, gallops or clicks. No pedal edema. Gastrointestinal system: Abdomen is nondistended, soft, +TTP lower abdomen. No organomegaly or masses felt. Normal bowel sounds heard. Gu: Bilateral nephrostomy tubes present, left  draining to bag yellow urine and right side is clamped  Central nervous system: Alert and oriented. Nonfocal. +MS  Extremities: Symmetric Skin: No rashes, lesions or ulcers, +chronic bilateral feet with venous stasis skin changes  Psychiatry: Judgement and insight appear normal. Mood & affect appropriate.   Data Reviewed: I have personally reviewed following labs and imaging studies  CBC:  Recent Labs Lab 07/12/16 1239 07/13/16 0220 07/14/16 0404 07/15/16 0654  WBC 18.9* 17.7* 16.6* 19.6*  NEUTROABS 16.5*  --   --   --   HGB 4.8* 10.1* 9.5* 8.4*  HCT 14.7* 30.4* 27.5* 24.8*  MCV 89.6 89.1 87.6 87.9  PLT 340 295 297 [id number]   Basic Metabolic Panel:  Recent Labs Lab 07/12/16 1239 07/13/16 0220 07/14/16 0404 07/15/16 0500  NA 142 140 140 140  K 4.1 3.6 3.0* 3.9  CL 121* 113* 114* 113*  CO2 16* 20* 19* 21*  GLUCOSE 84  96 107* 92  BUN 36* 44* 22* 14  CREATININE 1.75* 1.78* 1.13* 0.88  CALCIUM 6.7* 8.9 8.9 9.3  MG  --   --   --  1.7  PHOS  --   --   --  2.6   GFR: Estimated Creatinine Clearance: 68.9 mL/min (by C-G formula based on SCr of 0.88 mg/dL). Liver Function Tests:  Recent Labs Lab 07/12/16 1239  AST 32  ALT 11*  ALKPHOS 41  BILITOT 1.3*  PROT 5.2*  ALBUMIN 2.0*   No results for input(s): LIPASE, AMYLASE in the last 168 hours. No results for input(s): AMMONIA in the last 168 hours. Coagulation Profile:  Recent Labs Lab 07/12/16 1239 07/15/16 0845  INR 5.27* 1.16   Cardiac Enzymes: No results for input(s): CKTOTAL, CKMB, CKMBINDEX, TROPONINI in the last 168 hours. BNP (last 3 results) No results for input(s): PROBNP in the last 8760 hours. HbA1C:  Recent Labs  07/15/16 0651  HGBA1C 5.3   CBG:  Recent Labs Lab 07/15/16 0855 07/15/16 1233 07/15/16 1629 07/15/16 2144 07/16/16 0737  GLUCAP 100* 152* 88 92 107*   Lipid Profile: No results for input(s): CHOL, HDL, LDLCALC, TRIG, CHOLHDL, LDLDIRECT in the last 72 hours. Thyroid Function  Tests: No results for input(s): TSH, T4TOTAL, FREET4, T3FREE, THYROIDAB in the last 72 hours. Anemia Panel:  Recent Labs  07/15/16 0845  VITAMINB12 2,254*  FERRITIN 1,113*  TIBC 129*  IRON 9*   Sepsis Labs:  Recent Labs Lab 07/12/16 1321 07/12/16 1420 07/12/16 1754 07/12/16 2104  LATICACIDVEN 2.07* 3.22* 1.41 1.1    Recent Results (from the past 240 hour(s))  Culture, blood (Routine x 2)     Status: Abnormal (Preliminary result)   Collection Time: 07/12/16  1:13 PM  Result Value Ref Range Status   Specimen Description BLOOD LEFT ANTECUBITAL  Final   Special Requests BOTTLES DRAWN AEROBIC AND ANAEROBIC 5CC  Final   Culture  Setup Time   Final    GRAM POSITIVE COCCI IN CLUSTERS ANAEROBIC BOTTLE ONLY CRITICAL RESULT CALLED TO, READ BACK BY AND VERIFIED WITH: C. Shade Pharm.D. 15:15 07/13/16 ([email or web address])    Culture (A)  Final    STAPHYLOCOCCUS SPECIES (COAGULASE NEGATIVE) THE SIGNIFICANCE OF ISOLATING THIS ORGANISM FROM A SINGLE SET OF BLOOD CULTURES WHEN MULTIPLE SETS ARE DRAWN IS UNCERTAIN. PLEASE NOTIFY THE MICROBIOLOGY DEPARTMENT WITHIN ONE WEEK IF SPECIATION AND SENSITIVITIES ARE REQUIRED. Performed at [facility], [place] [address] [place], [place] [address]    Report Status PENDING  Incomplete  Culture, blood (Routine x 2)     Status: None (Preliminary result)   Collection Time: 07/12/16  1:13 PM  Result Value Ref Range Status   Specimen Description BLOOD LEFT ANTECUBITAL  Final   Special Requests BOTTLES DRAWN AEROBIC AND ANAEROBIC 5CC  Final   Culture   Final    NO GROWTH 3 DAYS Performed at [facility], [place] [address], [place], [place] [address]    Report Status PENDING  Incomplete  Blood Culture ID Panel (Reflexed)     Status: Abnormal   Collection Time: 07/12/16  1:13 PM  Result Value Ref Range Status   Enterococcus species NOT DETECTED NOT DETECTED Final   Listeria monocytogenes NOT DETECTED NOT DETECTED Final   Staphylococcus species DETECTED  (A) NOT DETECTED Final    Comment: Methicillin (oxacillin) susceptible coagulase negative staphylococcus. Possible blood culture contaminant (unless isolated from more than one blood culture draw or clinical case suggests pathogenicity). No  antibiotic treatment is indicated for blood  culture contaminants. CRITICAL RESULT CALLED TO, READ BACK BY AND VERIFIED WITH: C. Shade Pharm.D. 15:15 07/13/16 ([email or web address])    Staphylococcus aureus NOT DETECTED NOT DETECTED Final   Methicillin resistance NOT DETECTED NOT DETECTED Final   Streptococcus species NOT DETECTED NOT DETECTED Final   Streptococcus agalactiae NOT DETECTED NOT DETECTED Final   Streptococcus pneumoniae NOT DETECTED NOT DETECTED Final   Streptococcus pyogenes NOT DETECTED NOT DETECTED Final   Acinetobacter baumannii NOT DETECTED NOT DETECTED Final   Enterobacteriaceae species NOT DETECTED NOT DETECTED Final   Enterobacter cloacae complex NOT DETECTED NOT DETECTED Final   Escherichia coli NOT DETECTED NOT DETECTED Final   Klebsiella oxytoca NOT DETECTED NOT DETECTED Final   Klebsiella pneumoniae NOT DETECTED NOT DETECTED Final   Proteus species NOT DETECTED NOT DETECTED Final   Serratia marcescens NOT DETECTED NOT DETECTED Final   Haemophilus influenzae NOT DETECTED NOT DETECTED Final   Neisseria meningitidis NOT DETECTED NOT DETECTED Final   Pseudomonas aeruginosa NOT DETECTED NOT DETECTED Final   Candida albicans NOT DETECTED NOT DETECTED Final   Candida glabrata NOT DETECTED NOT DETECTED Final   Candida krusei NOT DETECTED NOT DETECTED Final   Candida parapsilosis NOT DETECTED NOT DETECTED Final   Candida tropicalis NOT DETECTED NOT DETECTED Final    Comment: Performed at [facility], [place] [address], [place], [place] [address]  Urine culture     Status: Abnormal   Collection Time: 07/12/16  5:33 PM  Result Value Ref Range Status   Specimen Description OTHER  Final   Special Requests NONE  Final   Culture MULTIPLE SPECIES  PRESENT, SUGGEST RECOLLECTION (A)  Final   Report Status 07/14/2016 FINAL  Final  MRSA PCR Screening     Status: None   Collection Time: 07/12/16  8:53 PM  Result Value Ref Range Status   MRSA by PCR NEGATIVE NEGATIVE Final    Comment:        The GeneXpert MRSA Assay (FDA approved for NASAL specimens only), is one component of a comprehensive MRSA colonization surveillance program. It is not intended to diagnose MRSA infection nor to guide or monitor treatment for MRSA infections.        Radiology Studies: Ct Abdomen Pelvis W Contrast  Result Date: 07/15/2016 CLINICAL DATA:  Acute onset of fever, hypotension, lactic acidosis and decreased hemoglobin. Initial encounter. EXAM: CT ABDOMEN AND PELVIS WITH CONTRAST TECHNIQUE: Multidetector CT imaging of the abdomen and pelvis was performed using the standard protocol following bolus administration of intravenous contrast. CONTRAST:  139mL ISOVUE-300 IOPAMIDOL (ISOVUE-300) INJECTION 61% COMPARISON:  CT of the abdomen and pelvis from 07/12/2016 FINDINGS: Lower chest: Trace bilateral pleural effusions are noted, with associated atelectasis. Trace pericardial fluid remains within normal limits. Hepatobiliary: A 5 mm hypodensity is noted in the periphery of the right hepatic lobe. The liver is otherwise unremarkable. The gallbladder is relatively decompressed and grossly unremarkable in appearance. The common bile duct remains normal in caliber. Pancreas: The pancreas is within normal limits. Spleen: The spleen is unremarkable in appearance. Adrenals/Urinary Tract: There is moderate chronic right-sided hydronephrosis despite right-sided nephrostomy tube placement, similar in appearance to the prior study. The nephrostomy tubes are stable in appearance. Chronic inflammation is noted about the left renal pelvis. Multiple left renal cysts are seen. Nonspecific perinephric stranding and fluid are noted bilaterally. Evaluation for renal stones is limited  given contrast in the left renal calyces. No obstructing ureteral stones are identified. Stomach/Bowel:  The appendix is grossly unremarkable in appearance. The degree of wall thickening and soft tissue inflammation about the proximal sigmoid colon is mildly improved from the prior study. No definite mass was seen in this region on earlier studies in 2017. This is concerning for acute diverticulitis, with diffuse surrounding soft tissue inflammation. New collections of fluid are seen tracking anterior to the bladder, with minimal associated foci of fat attenuation and diffuse surrounding soft tissue inflammation. Collections of fluid measure approximately 8.4 x 2.9 x 4.9 cm, and raise concern for evolving abscess. A colonic source is considered more likely. Minimal soft tissue inflammation is noted at the mid transverse colon, reflecting the infectious process within the pelvis. The small bowel is grossly unremarkable in appearance. The stomach is unremarkable. Vascular/Lymphatic: Minimal calcification is seen along the distal abdominal aorta and its branches. Mild mural thrombus is seen along the distal abdominal aorta. Prominent retroperitoneal nodes measure up to 1.6 cm in short axis. Prominent pelvic sidewall nodes measure up to 1.1 cm in short axis. This may reflect acute infection or possibly underlying metastatic disease. Reproductive: Marked soft tissue inflammation is noted about the anterior bladder, with multiple underlying bladder wall diverticula. A Foley catheter is noted. Multiple uterine fibroids are seen. No suspicious adnexal masses are identified, though the ovaries are difficult to fully assess given the infectious process within the pelvis. Other: No additional soft tissue abnormalities are seen. Musculoskeletal: No acute osseous abnormalities are identified. The visualized musculature is unremarkable in appearance. IMPRESSION: 1. New collections of fluid tracking anterior to the bladder,  measuring approximately 8.4 x 2.9 x 4.9 cm, with diffuse surrounding soft tissue inflammation and minimal associated foci of fat attenuation. This raises concern for evolving abscess within the anterior pelvis, in similar distribution to prior regions of phlegmon noted earlier in 2017. A colonic source is considered more likely. 2. Slight interval improvement in the appearance of wall thickening and soft tissue inflammation about the proximal sigmoid colon, still concerning for acute diverticulitis. Diffuse surrounding soft tissue inflammation again noted. 3. Moderate chronic right-sided hydronephrosis again noted, despite right-sided nephrostomy tube placement. The nephrostomy tubes are stable in appearance. Chronic inflammation about the left renal pelvis. 4. Prominent pelvic sidewall and retroperitoneal nodes, measuring up to 1.6 cm in short axis. This may reflect acute infection or possibly underlying metastatic disease. Further evaluation may be considered as deemed clinically appropriate. 5. Nonspecific 5 mm hypodensity in the periphery of the right hepatic lobe. 6. Trace bilateral pleural effusions, with associated atelectasis. 7. Mild mural thrombus along the distal abdominal aorta, without evidence of luminal narrowing. 8. Multiple uterine fibroids again noted. Electronically Signed   By: Garald Balding M.D.   On: 07/15/2016 18:11      Scheduled Meds: . atorvastatin  10 mg Oral QHS  . famotidine  20 mg Oral BID  . hydrocerin   Topical BID  . insulin aspart  0-9 Units Subcutaneous TID WC  . piperacillin-tazobactam (ZOSYN)  IV  3.375 g Intravenous Q8H   Continuous Infusions: . sodium chloride 75 mL/hr at 07/16/16 0548     LOS: 4 days    Time spent: 30 minutes   Dessa Phi, DO [facility] [email or web address] Password [email or web address] 07/16/2016, 8:16 AM

## 2016-07-16 NOTE — Progress Notes (Signed)
Right nephrostomy flushed w/ 10cc, nephrostomy bag attached and draining well.  Will continue to monitor and document output. Kizzie Ide, RN

## 2016-07-17 LAB — CBC WITH DIFFERENTIAL/PLATELET
Basophils Absolute: 0.2 10*3/uL — ABNORMAL HIGH (ref 0.0–0.1)
Basophils Relative: 1 %
EOS PCT: 3 %
Eosinophils Absolute: 0.4 10*3/uL (ref 0.0–0.7)
HEMATOCRIT: 23.7 % — AB (ref 36.0–46.0)
Hemoglobin: 7.8 g/dL — ABNORMAL LOW (ref 12.0–15.0)
LYMPHS ABS: 3.1 10*3/uL (ref 0.7–4.0)
LYMPHS PCT: 22 %
MCH: 28.8 pg (ref 26.0–34.0)
MCHC: 32.9 g/dL (ref 30.0–36.0)
MCV: 87.5 fL (ref 78.0–100.0)
MONO ABS: 1.4 10*3/uL — AB (ref 0.1–1.0)
MONOS PCT: 10 %
Neutro Abs: 9.2 10*3/uL — ABNORMAL HIGH (ref 1.7–7.7)
Neutrophils Relative %: 65 %
PLATELETS: 369 10*3/uL (ref 150–400)
RBC: 2.71 MIL/uL — ABNORMAL LOW (ref 3.87–5.11)
RDW: 16.9 % — AB (ref 11.5–15.5)
WBC: 14.2 10*3/uL — ABNORMAL HIGH (ref 4.0–10.5)

## 2016-07-17 LAB — BASIC METABOLIC PANEL
Anion gap: 3 — ABNORMAL LOW (ref 5–15)
BUN: 7 mg/dL (ref 6–20)
CALCIUM: 9 mg/dL (ref 8.9–10.3)
CO2: 22 mmol/L (ref 22–32)
Chloride: 119 mmol/L — ABNORMAL HIGH (ref 101–111)
Creatinine, Ser: 0.82 mg/dL (ref 0.44–1.00)
GFR calc Af Amer: >60 mL/min (ref >60)
GLUCOSE: 203 mg/dL — AB (ref 65–99)
POTASSIUM: 3.3 mmol/L — AB (ref 3.5–5.1)
Sodium: 144 mmol/L (ref 135–145)

## 2016-07-17 LAB — CULTURE, BLOOD (ROUTINE X 2): CULTURE: NO GROWTH

## 2016-07-17 LAB — GLUCOSE, CAPILLARY
GLUCOSE-CAPILLARY: 110 mg/dL — AB (ref 65–99)
GLUCOSE-CAPILLARY: 121 mg/dL — AB (ref 65–99)
Glucose-Capillary: 116 mg/dL — ABNORMAL HIGH (ref 65–99)
Glucose-Capillary: 130 mg/dL — ABNORMAL HIGH (ref 65–99)

## 2016-07-17 MED ORDER — POTASSIUM CHLORIDE CRYS ER 20 MEQ PO TBCR
40.0000 meq | EXTENDED_RELEASE_TABLET | Freq: Once | ORAL | Status: AC
  Administered 2016-07-17: 40 meq via ORAL
  Filled 2016-07-17: qty 2

## 2016-07-17 NOTE — Progress Notes (Signed)
Subjective: The patient is doing well.  No nausea or vomiting. Pain is adequately controlled but endorses some abdominal discomfort. Leukocytosis downtrending, creatinine down to .8 from 1.  Objective: Vital signs in last 24 hours: Temp:  [99.1 F (37.3 C)-99.3 F (37.4 C)] 99.3 F (37.4 C) (02/04 0519) Pulse Rate:  [82-92] 82 (02/04 0519) Resp:  [17-18] 17 (02/04 0519) BP: (121-134)/(65-75) 125/69 (02/04 0519) SpO2:  [97 %-99 %] 97 % (02/04 0519) Weight:  [68.6 kg (151 lb 3.8 oz)-71.2 kg (156 lb 15.5 oz)] 71.2 kg (156 lb 15.5 oz) (02/04 0519)  Intake/Output from previous day: 02/03 0701 - 02/04 0700 In: [id number] [P.O.:600; I.V.:860; IV Piggyback:100] Out: [id number] [Urine:1650] Intake/Output this shift: Total I/O In: 20 [Other:20] Out: 300 [Urine:300]  Physical Exam:  General: Alert and oriented. CV: RRR Lungs: Clear bilaterally. GI: Soft, Nondistended. Incisions: Clean and dry. Urine: Clear yellow from both PCNs. Small volume cloudy urine in foley Extremities: Nontender, no erythema, no edema.  Lab Results:  Recent Labs  07/15/16 0654 07/16/16 0749 07/17/16 0342  HGB 8.4* 7.9* 7.8*  HCT 24.8* 23.0* 23.7*          Recent Labs  07/15/16 0500 07/16/16 0749 07/17/16 0342  CREATININE 0.88 1.01* 0.82           Results for orders placed or performed during the hospital encounter of 07/12/16 (from the past 24 hour(s))  Glucose, capillary     Status: Abnormal   Collection Time: 07/16/16 11:32 AM  Result Value Ref Range   Glucose-Capillary 172 (H) 65 - 99 mg/dL  Glucose, capillary     Status: Abnormal   Collection Time: 07/16/16  5:38 PM  Result Value Ref Range   Glucose-Capillary 148 (H) 65 - 99 mg/dL  Glucose, capillary     Status: Abnormal   Collection Time: 07/16/16  9:50 PM  Result Value Ref Range   Glucose-Capillary 141 (H) 65 - 99 mg/dL  CBC with Differential/Platelet     Status: Abnormal   Collection Time: 07/17/16  3:42 AM  Result Value Ref Range   WBC  14.2 (H) 4.0 - 10.5 K/uL   RBC 2.71 (L) 3.87 - 5.11 MIL/uL   Hemoglobin 7.8 (L) 12.0 - 15.0 g/dL   HCT 23.7 (L) 36.0 - 46.0 %   MCV 87.5 78.0 - 100.0 fL   MCH 28.8 26.0 - 34.0 pg   MCHC 32.9 30.0 - 36.0 g/dL   RDW 16.9 (H) 11.5 - 15.5 %   Platelets 369 150 - 400 K/uL   Neutrophils Relative % 65 %   Neutro Abs 9.2 (H) 1.7 - 7.7 K/uL   Lymphocytes Relative 22 %   Lymphs Abs 3.1 0.7 - 4.0 K/uL   Monocytes Relative 10 %   Monocytes Absolute 1.4 (H) 0.1 - 1.0 K/uL   Eosinophils Relative 3 %   Eosinophils Absolute 0.4 0.0 - 0.7 K/uL   Basophils Relative 1 %   Basophils Absolute 0.2 (H) 0.0 - 0.1 K/uL   Smear Review MORPHOLOGY UNREMARKABLE   Basic metabolic panel     Status: Abnormal   Collection Time: 07/17/16  3:42 AM  Result Value Ref Range   Sodium 144 135 - 145 mmol/L   Potassium 3.3 (L) 3.5 - 5.1 mmol/L   Chloride 119 (H) 101 - 111 mmol/L   CO2 22 22 - 32 mmol/L   Glucose, Bld 203 (H) 65 - 99 mg/dL   BUN 7 6 - 20 mg/dL   Creatinine, Ser 0.82 0.44 -  1.00 mg/dL   Calcium 9.0 8.9 - 10.3 mg/dL   GFR calc non Af Amer >60 >60 mL/min   GFR calc Af Amer >60 >60 mL/min   Anion gap 3 (L) 5 - 15  Glucose, capillary     Status: Abnormal   Collection Time: 07/17/16  7:38 AM  Result Value Ref Range   Glucose-Capillary 116 (H) 65 - 99 mg/dL    Assessment/Plan: 56 year old female with complication past medical history of MS, neurogenic bladder and likely pelvic abscesses, ?secondary to urinoma from bladder leak/fistula.  1) Multiloculated pelvic fluid collection - This likely represents a pelvic abscess vs urinoma. Keep both nephrostomy tubes and foley catheter to straight drainage for maximal diversion of urine away from the bladder, potentially allowing this connection to heal and collection to partially resolve. Foley was exchanged/upsized to 76 Fr today for maximal drainage of her bladder  2) Left staghorn calculus - will need to be treated by left PCNL at some point but makes sense  to address other issues first. Can be managed with nephrostomy tube until then.   LOS: 5 days   Lolita Rieger 07/17/2016, 9:19 AM

## 2016-07-17 NOTE — Progress Notes (Addendum)
[facility]:  [phone] General Surgery Consult Note   LOS: 5 days  POD -     Assessment/Plan: 1.  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  There has never been any proven GI involvement.  WBC - 14,200 - 07/17/2016  On Zosyn - 1/30 >>>  Discussed with Dr. Jeffie Pollock who plans to upsize her foley and drain both NT.  There is no obvious general surgery issue - will sign off.  2.  Bilateral nephrostomy tubes with obstructing stones - last changed 07/07/2016  Followed by Dr. Diona Fanti  Right sided hydronephrosis  3.  Pelvic and retroperitoneal adenopathy 4.  Nonspecific 5 mm hypodensity in periphery of right lobe of liver 5,  Mural thrombus along distal abdominal aorta. 6.  Anemia  Hgb - 7.8 - 07/17/2016 7.  Hypokalemia 8   MS - wheelchair bound 9.  DM 8.  DVT prophylaxis - not on chemoprophylaxis      She was on xarelto   Active Problems:   Septic shock ([place])  Subjective:  Doing okay. No new symptoms.  She is eating and tolerating regular food.  Social History: Unmarried and not children. Lives at [facility]. Her cousin, [name] [phone]), is her POA.   Objective:   Vitals:   07/16/16 2146 07/17/16 0519  BP: 134/75 125/69  Pulse: 92 82  Resp: 18 17  Temp: 99.2 F (37.3 C) 99.3 F (37.4 C)     Intake/Output from previous day:  02/03 0701 - 02/04 0700 In: [id number] [P.O.:600; I.V.:860; IV Piggyback:100] Out: [id number] [Urine:1650]  Intake/Output this shift:  Total I/O In: 20 [Other:20] Out: 300 [Urine:300]   Physical Exam:   General: AA F who is alert.     HEENT: Normal. Pupils equal. .   Lungs: Clear.   Abdomen: BS present.  Suprapubic pain and tenderness.   Has foley in place.    Lab Results:     Recent Labs  07/16/16 0749 07/17/16 0342  WBC 16.7* 14.2*  HGB 7.9* 7.8*  HCT 23.0* 23.7*  PLT 310 369     BMET    Recent Labs  07/16/16 0749 07/17/16 0342  NA 143 144  K 3.0* 3.3*  CL 115* 119*  CO2 21* 22  GLUCOSE 109* 203*  BUN 12 7  CREATININE 1.01* 0.82  CALCIUM 9.3 9.0    PT/INR    Recent Labs  07/15/16 0845  LABPROT 14.8  INR 1.16    ABG  No results for input(s): PHART, HCO3 in the last 72 hours.  Invalid input(s): PCO2, PO2   Studies/Results:  Ct Abdomen Pelvis W Contrast  Result Date: 07/15/2016 CLINICAL DATA:  Acute onset of fever, hypotension, lactic acidosis and decreased hemoglobin. Initial encounter. EXAM: CT ABDOMEN AND PELVIS WITH CONTRAST TECHNIQUE: Multidetector CT imaging of the abdomen and pelvis was performed using the standard protocol following bolus administration of intravenous contrast. CONTRAST:  119mL ISOVUE-300 IOPAMIDOL (ISOVUE-300) INJECTION 61% COMPARISON:  CT of the abdomen and pelvis from 07/12/2016 FINDINGS: Lower chest: Trace bilateral pleural effusions are noted, with associated atelectasis. Trace pericardial fluid remains within normal limits. Hepatobiliary: A 5 mm hypodensity is noted in the periphery of the right hepatic lobe. The liver is otherwise unremarkable. The gallbladder is relatively decompressed and grossly unremarkable in appearance. The common bile duct remains normal in caliber. Pancreas: The pancreas  is within normal limits. Spleen: The spleen is unremarkable in appearance. Adrenals/Urinary Tract: There is moderate chronic right-sided hydronephrosis despite right-sided nephrostomy tube placement, similar in appearance to the prior study. The nephrostomy tubes are stable in appearance. Chronic inflammation is noted about the left renal pelvis. Multiple left renal cysts are seen. Nonspecific perinephric stranding and fluid are noted bilaterally. Evaluation for renal stones is limited given contrast in the left renal calyces. No obstructing ureteral stones are identified. Stomach/Bowel: The appendix is grossly unremarkable in  appearance. The degree of wall thickening and soft tissue inflammation about the proximal sigmoid colon is mildly improved from the prior study. No definite mass was seen in this region on earlier studies in 2017. This is concerning for acute diverticulitis, with diffuse surrounding soft tissue inflammation. New collections of fluid are seen tracking anterior to the bladder, with minimal associated foci of fat attenuation and diffuse surrounding soft tissue inflammation. Collections of fluid measure approximately 8.4 x 2.9 x 4.9 cm, and raise concern for evolving abscess. A colonic source is considered more likely. Minimal soft tissue inflammation is noted at the mid transverse colon, reflecting the infectious process within the pelvis. The small bowel is grossly unremarkable in appearance. The stomach is unremarkable. Vascular/Lymphatic: Minimal calcification is seen along the distal abdominal aorta and its branches. Mild mural thrombus is seen along the distal abdominal aorta. Prominent retroperitoneal nodes measure up to 1.6 cm in short axis. Prominent pelvic sidewall nodes measure up to 1.1 cm in short axis. This may reflect acute infection or possibly underlying metastatic disease. Reproductive: Marked soft tissue inflammation is noted about the anterior bladder, with multiple underlying bladder wall diverticula. A Foley catheter is noted. Multiple uterine fibroids are seen. No suspicious adnexal masses are identified, though the ovaries are difficult to fully assess given the infectious process within the pelvis. Other: No additional soft tissue abnormalities are seen. Musculoskeletal: No acute osseous abnormalities are identified. The visualized musculature is unremarkable in appearance. IMPRESSION: 1. New collections of fluid tracking anterior to the bladder, measuring approximately 8.4 x 2.9 x 4.9 cm, with diffuse surrounding soft tissue inflammation and minimal associated foci of fat attenuation. This  raises concern for evolving abscess within the anterior pelvis, in similar distribution to prior regions of phlegmon noted earlier in 2017. A colonic source is considered more likely. 2. Slight interval improvement in the appearance of wall thickening and soft tissue inflammation about the proximal sigmoid colon, still concerning for acute diverticulitis. Diffuse surrounding soft tissue inflammation again noted. 3. Moderate chronic right-sided hydronephrosis again noted, despite right-sided nephrostomy tube placement. The nephrostomy tubes are stable in appearance. Chronic inflammation about the left renal pelvis. 4. Prominent pelvic sidewall and retroperitoneal nodes, measuring up to 1.6 cm in short axis. This may reflect acute infection or possibly underlying metastatic disease. Further evaluation may be considered as deemed clinically appropriate. 5. Nonspecific 5 mm hypodensity in the periphery of the right hepatic lobe. 6. Trace bilateral pleural effusions, with associated atelectasis. 7. Mild mural thrombus along the distal abdominal aorta, without evidence of luminal narrowing. 8. Multiple uterine fibroids again noted. Electronically Signed   By: Garald Balding M.D.   On: 07/15/2016 18:11     Anti-infectives:   Anti-infectives    Start     Dose/Rate Route Frequency Ordered Stop   07/13/16 1400  vancomycin (VANCOCIN) IVPB 1000 mg/200 mL premix  Status:  Discontinued     1,000 mg 200 mL/hr over 60 Minutes Intravenous Every 24 hours 07/12/16  2058 07/14/16 1109   07/13/16 1200  vancomycin (VANCOCIN) IVPB 1000 mg/200 mL premix  Status:  Discontinued     1,000 mg 200 mL/hr over 60 Minutes Intravenous Every 24 hours 07/12/16 1412 07/12/16 2051   07/13/16 0000  vancomycin (VANCOCIN) 50 mg/mL oral solution 500 mg  Status:  Discontinued     500 mg Oral Every 6 hours 07/12/16 2051 07/15/16 1328   07/12/16 2200  piperacillin-tazobactam (ZOSYN) IVPB 3.375 g  Status:  Discontinued     3.375 g 12.5 mL/hr  over 240 Minutes Intravenous Every 8 hours 07/12/16 1412 07/12/16 2051   07/12/16 2200  piperacillin-tazobactam (ZOSYN) IVPB 3.375 g     3.375 g 12.5 mL/hr over 240 Minutes Intravenous Every 8 hours 07/12/16 2058     07/12/16 1315  piperacillin-tazobactam (ZOSYN) IVPB 3.375 g     3.375 g 100 mL/hr over 30 Minutes Intravenous  Once 07/12/16 1314 07/12/16 1356   07/12/16 1315  vancomycin (VANCOCIN) IVPB 1000 mg/200 mL premix     1,000 mg 200 mL/hr over 60 Minutes Intravenous  Once 07/12/16 1314 07/12/16 1512      Alphonsa Overall, MD, FACS Pager: [facility] Office: [phone] 07/17/2016

## 2016-07-17 NOTE — Progress Notes (Signed)
PROGRESS NOTE    [name]  [id number] DOB: 04/10/1961 DOA: 07/12/2016 PCP: Blanchie Serve, MD     Brief Narrative:  [name] is a 56 yo female with past medical history of multiple sclerosis, history of DVT on Xarelto, hx of bilateral obstructing nephrolithiasis s/p bilateral perc nephrostomy tube in July 2017 and December 2017, who presented to the emergency department due to fevers. She was encephalopathic at time of admission. In the emergency department, she had a fever of 103, hypotensive, lactic acidosis, and hemoglobin of 4. She was initially admitted to [facility] services, with presumed sepsis secondary to UTI. She was treated with IV antibiotics, given blood products. She did require pressors for blood pressure support   Assessment & Plan:   Active Problems:   Septic shock (HCC)  Shock, multifactorial -Septic as well as hemorrhagic -Required levophed, her pressure is now stable now off pressors -Initially thought source to be septic secondary to UTI. She was started on IV Vanco and Zosyn, but urine culture has now resulted with multiple organisms, UA does show large leuk, numerous WBC, many bacteria. Previous urine culture has shown pan-sensitive E Coli and Pseudomonas, resistant providencia  -CT renal stone on 1/30 with questionable cystitis, questionable diverticulitis or colitis  -Due to diarrhea, C. difficile PCR was ordered as well as oral vancomycin. However, before sample could be obtained, she stopped having diarrhea. Therefore PCR test, enteric precaution, oral vanco are stopped.  -Blood culture with 1 of 2 coag neg staph, likely contaminant  -Influenza negative  -CT abd/pelvis obtained yesterday due to continued leukocytosis, this showed multiloculated pelvic fluid collection  -Appreciate general surgery, has now signed off  -Urology consulted: likely pelvic abscess vs urinoma. Continue nephrostomy tubes and foley to allow drainage of urine away from bladder for  healing  -Continue zosyn for now. Plan to deescalate to oral once leukocytosis showing continued improvement   Acute metabolic encephalopathy -Now resolved and at baseline  Acute normocytic anemia on chronic normocytic anemia -Baseline Hgb 8.2-9.5 -Unclear etiology, had hemoccult positive stool but no report of melena or hematochezia to explain Hgb 4 on admission  -Received 2u pRBC on 1/30  -Iron studies are not consistent with iron deficiency anemia -INR now normal, LDH normal, fibrinogen elevated, Haptoglobin 308. Not consistent with hemolytic anemia or DIC  -Vit B 12: 2,254. Folate pending  -Continue to trend CBC. No reports of hematochezia/melena this admission  -Suspect she will require GI evaluation at some point once her acute illness resolve   AKI  -Baseline Cr 0.5-0.7, Cr was 1.75 on admission -Resolved with IVF   History of DVT -Xarelto was held on admission due to anemia -Currently on SCDs  Hx bilateral obstructing nephrolithiasis s/p nephrostomy tube  -Managed by IR outpatient   DM type 2, well controlled  -Hold oral home meds  -Ha1c 5.3 -ISS  Essential HTN -Holding home antihypertensives due to shock on admission, BP currently stable   Hypokalemia -Replace and trend    DVT prophylaxis: SCDs Code Status: Full Family Communication: no family at bedside Disposition Plan: pending further stabilization, improvement. Back to SNF    Consultants:   PCCM  General surgery  Urology   Procedures:   None  Antimicrobials:  Anti-infectives    Start     Dose/Rate Route Frequency Ordered Stop   07/13/16 1400  vancomycin (VANCOCIN) IVPB 1000 mg/200 mL premix  Status:  Discontinued     1,000 mg 200 mL/hr over 60 Minutes Intravenous Every 24  hours 07/12/16 2058 07/14/16 1109   07/13/16 1200  vancomycin (VANCOCIN) IVPB 1000 mg/200 mL premix  Status:  Discontinued     1,000 mg 200 mL/hr over 60 Minutes Intravenous Every 24 hours 07/12/16 1412 07/12/16 2051    07/13/16 0000  vancomycin (VANCOCIN) 50 mg/mL oral solution 500 mg  Status:  Discontinued     500 mg Oral Every 6 hours 07/12/16 2051 07/15/16 1328   07/12/16 2200  piperacillin-tazobactam (ZOSYN) IVPB 3.375 g  Status:  Discontinued     3.375 g 12.5 mL/hr over 240 Minutes Intravenous Every 8 hours 07/12/16 1412 07/12/16 2051   07/12/16 2200  piperacillin-tazobactam (ZOSYN) IVPB 3.375 g     3.375 g 12.5 mL/hr over 240 Minutes Intravenous Every 8 hours 07/12/16 2058     07/12/16 1315  piperacillin-tazobactam (ZOSYN) IVPB 3.375 g     3.375 g 100 mL/hr over 30 Minutes Intravenous  Once 07/12/16 1314 07/12/16 1356   07/12/16 1315  vancomycin (VANCOCIN) IVPB 1000 mg/200 mL premix     1,000 mg 200 mL/hr over 60 Minutes Intravenous  Once 07/12/16 1314 07/12/16 1512       Subjective: Patient with no new complaints today. No acute events overnight.   Objective: Vitals:   07/16/16 0931 07/16/16 1313 07/16/16 2146 07/17/16 0519  BP:  121/65 134/75 125/69  Pulse:  86 92 82  Resp:  18 18 17   Temp:  99.1 F (37.3 C) 99.2 F (37.3 C) 99.3 F (37.4 C)  TempSrc:  Oral Oral Oral  SpO2:  99% 98% 97%  Weight: 68.6 kg (151 lb 3.8 oz)   71.2 kg (156 lb 15.5 oz)  Height:        Intake/Output Summary (Last 24 hours) at 07/17/16 1056 Last data filed at 07/17/16 0904  Gross per 24 hour  Intake             1580 ml  Output             1750 ml  Net             -170 ml   Filed Weights   07/16/16 0857 07/16/16 0931 07/17/16 0519  Weight: 68.6 kg (151 lb 3.8 oz) 68.6 kg (151 lb 3.8 oz) 71.2 kg (156 lb 15.5 oz)    Examination:  General exam: Appears calm and comfortable  Respiratory system: Clear to auscultation. Respiratory effort normal. Cardiovascular system: S1 & S2 heard, RRR. No JVD, murmurs, rubs, gallops or clicks. No pedal edema. Gastrointestinal system: Abdomen is nondistended, soft, +TTP lower abdomen. No organomegaly or masses felt. Normal bowel sounds heard. Gu: Bilateral nephrostomy  tubes present  Central nervous system: Alert and oriented. Nonfocal. +MS  Extremities: Symmetric Skin: No rashes, lesions or ulcers, +chronic bilateral feet with venous stasis skin changes  Psychiatry: Judgement and insight appear normal. Mood & affect appropriate.   Data Reviewed: I have personally reviewed following labs and imaging studies  CBC:  Recent Labs Lab 07/12/16 1239 07/13/16 0220 07/14/16 0404 07/15/16 0654 07/16/16 0749 07/17/16 0342  WBC 18.9* 17.7* 16.6* 19.6* 16.7* 14.2*  NEUTROABS 16.5*  --   --   --  13.1* 9.2*  HGB 4.8* 10.1* 9.5* 8.4* 7.9* 7.8*  HCT 14.7* 30.4* 27.5* 24.8* 23.0* 23.7*  MCV 89.6 89.1 87.6 87.9 87.8 87.5  PLT 340 295 297 285 310 0000000   Basic Metabolic Panel:  Recent Labs Lab 07/13/16 0220 07/14/16 0404 07/15/16 0500 07/16/16 0749 07/17/16 0342  NA 140 140 140 143  144  K 3.6 3.0* 3.9 3.0* 3.3*  CL 113* 114* 113* 115* 119*  CO2 20* 19* 21* 21* 22  GLUCOSE 96 107* 92 109* 203*  BUN 44* 22* 14 12 7   CREATININE 1.78* 1.13* 0.88 1.01* 0.82  CALCIUM 8.9 8.9 9.3 9.3 9.0  MG  --   --  1.7  --   --   PHOS  --   --  2.6  --   --    GFR: Estimated Creatinine Clearance: 74.9 mL/min (by C-G formula based on SCr of 0.82 mg/dL). Liver Function Tests:  Recent Labs Lab 07/12/16 1239 07/16/16 0749  AST 32 30  ALT 11* 47  ALKPHOS 41 136*  BILITOT 1.3* 0.6  PROT 5.2* 6.0*  ALBUMIN 2.0* 1.9*   No results for input(s): LIPASE, AMYLASE in the last 168 hours. No results for input(s): AMMONIA in the last 168 hours. Coagulation Profile:  Recent Labs Lab 07/12/16 1239 07/15/16 0845  INR 5.27* 1.16   Cardiac Enzymes: No results for input(s): CKTOTAL, CKMB, CKMBINDEX, TROPONINI in the last 168 hours. BNP (last 3 results) No results for input(s): PROBNP in the last 8760 hours. HbA1C:  Recent Labs  07/15/16 0651  HGBA1C 5.3   CBG:  Recent Labs Lab 07/16/16 0737 07/16/16 1132 07/16/16 1738 07/16/16 2150 07/17/16 0738  GLUCAP  107* 172* 148* 141* 116*   Lipid Profile: No results for input(s): CHOL, HDL, LDLCALC, TRIG, CHOLHDL, LDLDIRECT in the last 72 hours. Thyroid Function Tests: No results for input(s): TSH, T4TOTAL, FREET4, T3FREE, THYROIDAB in the last 72 hours. Anemia Panel:  Recent Labs  07/15/16 0845  VITAMINB12 2,254*  FERRITIN 1,113*  TIBC 129*  IRON 9*   Sepsis Labs:  Recent Labs Lab 07/12/16 1321 07/12/16 1420 07/12/16 1754 07/12/16 2104  LATICACIDVEN 2.07* 3.22* 1.41 1.1    Recent Results (from the past 240 hour(s))  Culture, blood (Routine x 2)     Status: Abnormal   Collection Time: 07/12/16  1:13 PM  Result Value Ref Range Status   Specimen Description BLOOD LEFT ANTECUBITAL  Final   Special Requests BOTTLES DRAWN AEROBIC AND ANAEROBIC 5CC  Final   Culture  Setup Time   Final    GRAM POSITIVE COCCI IN CLUSTERS ANAEROBIC BOTTLE ONLY CRITICAL RESULT CALLED TO, READ BACK BY AND VERIFIED WITH: C. Shade Pharm.D. 15:15 07/13/16 ([email or web address])    Culture (A)  Final    STAPHYLOCOCCUS SPECIES (COAGULASE NEGATIVE) THE SIGNIFICANCE OF ISOLATING THIS ORGANISM FROM A SINGLE SET OF BLOOD CULTURES WHEN MULTIPLE SETS ARE DRAWN IS UNCERTAIN. PLEASE NOTIFY THE MICROBIOLOGY DEPARTMENT WITHIN ONE WEEK IF SPECIATION AND SENSITIVITIES ARE REQUIRED. Performed at [facility], [place] [address], [place], [place] [address]    Report Status 07/16/2016 FINAL  Final  Culture, blood (Routine x 2)     Status: None (Preliminary result)   Collection Time: 07/12/16  1:13 PM  Result Value Ref Range Status   Specimen Description BLOOD LEFT ANTECUBITAL  Final   Special Requests BOTTLES DRAWN AEROBIC AND ANAEROBIC 5CC  Final   Culture   Final    NO GROWTH 4 DAYS Performed at [facility], [place] [address], [place], [place] [address]    Report Status PENDING  Incomplete  Blood Culture ID Panel (Reflexed)     Status: Abnormal   Collection Time: 07/12/16  1:13 PM  Result Value Ref Range Status    Enterococcus species NOT DETECTED NOT DETECTED Final   Listeria monocytogenes NOT  DETECTED NOT DETECTED Final   Staphylococcus species DETECTED (A) NOT DETECTED Final    Comment: Methicillin (oxacillin) susceptible coagulase negative staphylococcus. Possible blood culture contaminant (unless isolated from more than one blood culture draw or clinical case suggests pathogenicity). No antibiotic treatment is indicated for blood  culture contaminants. CRITICAL RESULT CALLED TO, READ BACK BY AND VERIFIED WITH: C. Shade Pharm.D. 15:15 07/13/16 ([email or web address])    Staphylococcus aureus NOT DETECTED NOT DETECTED Final   Methicillin resistance NOT DETECTED NOT DETECTED Final   Streptococcus species NOT DETECTED NOT DETECTED Final   Streptococcus agalactiae NOT DETECTED NOT DETECTED Final   Streptococcus pneumoniae NOT DETECTED NOT DETECTED Final   Streptococcus pyogenes NOT DETECTED NOT DETECTED Final   Acinetobacter baumannii NOT DETECTED NOT DETECTED Final   Enterobacteriaceae species NOT DETECTED NOT DETECTED Final   Enterobacter cloacae complex NOT DETECTED NOT DETECTED Final   Escherichia coli NOT DETECTED NOT DETECTED Final   Klebsiella oxytoca NOT DETECTED NOT DETECTED Final   Klebsiella pneumoniae NOT DETECTED NOT DETECTED Final   Proteus species NOT DETECTED NOT DETECTED Final   Serratia marcescens NOT DETECTED NOT DETECTED Final   Haemophilus influenzae NOT DETECTED NOT DETECTED Final   Neisseria meningitidis NOT DETECTED NOT DETECTED Final   Pseudomonas aeruginosa NOT DETECTED NOT DETECTED Final   Candida albicans NOT DETECTED NOT DETECTED Final   Candida glabrata NOT DETECTED NOT DETECTED Final   Candida krusei NOT DETECTED NOT DETECTED Final   Candida parapsilosis NOT DETECTED NOT DETECTED Final   Candida tropicalis NOT DETECTED NOT DETECTED Final    Comment: Performed at [facility], [place] [address], [place], [place] [address]  Urine culture     Status: Abnormal   Collection Time:  07/12/16  5:33 PM  Result Value Ref Range Status   Specimen Description OTHER  Final   Special Requests NONE  Final   Culture MULTIPLE SPECIES PRESENT, SUGGEST RECOLLECTION (A)  Final   Report Status 07/14/2016 FINAL  Final  MRSA PCR Screening     Status: None   Collection Time: 07/12/16  8:53 PM  Result Value Ref Range Status   MRSA by PCR NEGATIVE NEGATIVE Final    Comment:        The GeneXpert MRSA Assay (FDA approved for NASAL specimens only), is one component of a comprehensive MRSA colonization surveillance program. It is not intended to diagnose MRSA infection nor to guide or monitor treatment for MRSA infections.        Radiology Studies: Ct Abdomen Pelvis W Contrast  Result Date: 07/15/2016 CLINICAL DATA:  Acute onset of fever, hypotension, lactic acidosis and decreased hemoglobin. Initial encounter. EXAM: CT ABDOMEN AND PELVIS WITH CONTRAST TECHNIQUE: Multidetector CT imaging of the abdomen and pelvis was performed using the standard protocol following bolus administration of intravenous contrast. CONTRAST:  160mL ISOVUE-300 IOPAMIDOL (ISOVUE-300) INJECTION 61% COMPARISON:  CT of the abdomen and pelvis from 07/12/2016 FINDINGS: Lower chest: Trace bilateral pleural effusions are noted, with associated atelectasis. Trace pericardial fluid remains within normal limits. Hepatobiliary: A 5 mm hypodensity is noted in the periphery of the right hepatic lobe. The liver is otherwise unremarkable. The gallbladder is relatively decompressed and grossly unremarkable in appearance. The common bile duct remains normal in caliber. Pancreas: The pancreas is within normal limits. Spleen: The spleen is unremarkable in appearance. Adrenals/Urinary Tract: There is moderate chronic right-sided hydronephrosis despite right-sided nephrostomy tube placement, similar in appearance to the prior study. The nephrostomy tubes are stable in appearance. Chronic inflammation  is noted about the left renal  pelvis. Multiple left renal cysts are seen. Nonspecific perinephric stranding and fluid are noted bilaterally. Evaluation for renal stones is limited given contrast in the left renal calyces. No obstructing ureteral stones are identified. Stomach/Bowel: The appendix is grossly unremarkable in appearance. The degree of wall thickening and soft tissue inflammation about the proximal sigmoid colon is mildly improved from the prior study. No definite mass was seen in this region on earlier studies in 2017. This is concerning for acute diverticulitis, with diffuse surrounding soft tissue inflammation. New collections of fluid are seen tracking anterior to the bladder, with minimal associated foci of fat attenuation and diffuse surrounding soft tissue inflammation. Collections of fluid measure approximately 8.4 x 2.9 x 4.9 cm, and raise concern for evolving abscess. A colonic source is considered more likely. Minimal soft tissue inflammation is noted at the mid transverse colon, reflecting the infectious process within the pelvis. The small bowel is grossly unremarkable in appearance. The stomach is unremarkable. Vascular/Lymphatic: Minimal calcification is seen along the distal abdominal aorta and its branches. Mild mural thrombus is seen along the distal abdominal aorta. Prominent retroperitoneal nodes measure up to 1.6 cm in short axis. Prominent pelvic sidewall nodes measure up to 1.1 cm in short axis. This may reflect acute infection or possibly underlying metastatic disease. Reproductive: Marked soft tissue inflammation is noted about the anterior bladder, with multiple underlying bladder wall diverticula. A Foley catheter is noted. Multiple uterine fibroids are seen. No suspicious adnexal masses are identified, though the ovaries are difficult to fully assess given the infectious process within the pelvis. Other: No additional soft tissue abnormalities are seen. Musculoskeletal: No acute osseous abnormalities are  identified. The visualized musculature is unremarkable in appearance. IMPRESSION: 1. New collections of fluid tracking anterior to the bladder, measuring approximately 8.4 x 2.9 x 4.9 cm, with diffuse surrounding soft tissue inflammation and minimal associated foci of fat attenuation. This raises concern for evolving abscess within the anterior pelvis, in similar distribution to prior regions of phlegmon noted earlier in 2017. A colonic source is considered more likely. 2. Slight interval improvement in the appearance of wall thickening and soft tissue inflammation about the proximal sigmoid colon, still concerning for acute diverticulitis. Diffuse surrounding soft tissue inflammation again noted. 3. Moderate chronic right-sided hydronephrosis again noted, despite right-sided nephrostomy tube placement. The nephrostomy tubes are stable in appearance. Chronic inflammation about the left renal pelvis. 4. Prominent pelvic sidewall and retroperitoneal nodes, measuring up to 1.6 cm in short axis. This may reflect acute infection or possibly underlying metastatic disease. Further evaluation may be considered as deemed clinically appropriate. 5. Nonspecific 5 mm hypodensity in the periphery of the right hepatic lobe. 6. Trace bilateral pleural effusions, with associated atelectasis. 7. Mild mural thrombus along the distal abdominal aorta, without evidence of luminal narrowing. 8. Multiple uterine fibroids again noted. Electronically Signed   By: Garald Balding M.D.   On: 07/15/2016 18:11      Scheduled Meds: . atorvastatin  10 mg Oral QHS  . famotidine  20 mg Oral BID  . hydrocerin   Topical BID  . insulin aspart  0-9 Units Subcutaneous TID WC  . piperacillin-tazobactam (ZOSYN)  IV  3.375 g Intravenous Q8H   Continuous Infusions: . sodium chloride 75 mL/hr at 07/16/16 0548     LOS: 5 days    Time spent: 30 minutes   Dessa Phi, DO [facility] [email or web address] Password [email or web address] 07/17/2016, 10:56 AM

## 2016-07-18 ENCOUNTER — Inpatient Hospital Stay (HOSPITAL_COMMUNITY): Payer: Medicare Other

## 2016-07-18 LAB — CBC WITH DIFFERENTIAL/PLATELET
BASOS ABS: 0.2 10*3/uL — AB (ref 0.0–0.1)
BASOS PCT: 1 %
EOS ABS: 0.5 10*3/uL (ref 0.0–0.7)
Eosinophils Relative: 3 %
HEMATOCRIT: 25.4 % — AB (ref 36.0–46.0)
HEMOGLOBIN: 8.2 g/dL — AB (ref 12.0–15.0)
LYMPHS PCT: 19 %
Lymphs Abs: 2.9 10*3/uL (ref 0.7–4.0)
MCH: 28.3 pg (ref 26.0–34.0)
MCHC: 32.3 g/dL (ref 30.0–36.0)
MCV: 87.6 fL (ref 78.0–100.0)
MONOS PCT: 7 %
Monocytes Absolute: 1.1 10*3/uL — ABNORMAL HIGH (ref 0.1–1.0)
NEUTROS PCT: 70 %
Neutro Abs: 10.6 10*3/uL — ABNORMAL HIGH (ref 1.7–7.7)
Platelets: 441 10*3/uL — ABNORMAL HIGH (ref 150–400)
RBC: 2.9 MIL/uL — ABNORMAL LOW (ref 3.87–5.11)
RDW: 17.1 % — ABNORMAL HIGH (ref 11.5–15.5)
WBC: 15.3 10*3/uL — ABNORMAL HIGH (ref 4.0–10.5)

## 2016-07-18 LAB — GLUCOSE, CAPILLARY
GLUCOSE-CAPILLARY: 147 mg/dL — AB (ref 65–99)
Glucose-Capillary: 97 mg/dL (ref 65–99)
Glucose-Capillary: 161 mg/dL — ABNORMAL HIGH (ref 65–99)
Glucose-Capillary: 121 mg/dL — ABNORMAL HIGH (ref 65–99)

## 2016-07-18 LAB — BASIC METABOLIC PANEL
Anion gap: 5 (ref 5–15)
BUN: <5 mg/dL — ABNORMAL LOW (ref 6–20)
CHLORIDE: 117 mmol/L — AB (ref 101–111)
CO2: 22 mmol/L (ref 22–32)
Calcium: 9.1 mg/dL (ref 8.9–10.3)
Creatinine, Ser: 0.59 mg/dL (ref 0.44–1.00)
GFR calc non Af Amer: >60 mL/min (ref >60)
Glucose, Bld: 106 mg/dL — ABNORMAL HIGH (ref 65–99)
POTASSIUM: 3.2 mmol/L — AB (ref 3.5–5.1)
SODIUM: 144 mmol/L (ref 135–145)

## 2016-07-18 LAB — FOLATE RBC
Folate, Hemolysate: 542.8 ng/mL
Folate, RBC: 1996 ng/mL (ref >498)
Hematocrit: 27.2 % — ABNORMAL LOW (ref 34.0–46.6)

## 2016-07-18 MED ORDER — ACETAMINOPHEN 325 MG PO TABS
650.0000 mg | ORAL_TABLET | ORAL | Status: DC | PRN
  Administered 2016-07-18 – 2016-07-26 (×4): 650 mg via ORAL
  Filled 2016-07-18 (×4): qty 2

## 2016-07-18 MED ORDER — VANCOMYCIN HCL IN DEXTROSE 1-5 GM/200ML-% IV SOLN
1000.0000 mg | INTRAVENOUS | Status: AC
  Administered 2016-07-18: 1000 mg via INTRAVENOUS
  Filled 2016-07-18: qty 200

## 2016-07-18 MED ORDER — IPRATROPIUM-ALBUTEROL 0.5-2.5 (3) MG/3ML IN SOLN
3.0000 mL | Freq: Four times a day (QID) | RESPIRATORY_TRACT | Status: DC
  Administered 2016-07-18 (×2): 3 mL via RESPIRATORY_TRACT
  Filled 2016-07-18 (×2): qty 3

## 2016-07-18 MED ORDER — POTASSIUM CHLORIDE CRYS ER 20 MEQ PO TBCR
40.0000 meq | EXTENDED_RELEASE_TABLET | Freq: Once | ORAL | Status: AC
  Administered 2016-07-18: 40 meq via ORAL
  Filled 2016-07-18: qty 2

## 2016-07-18 MED ORDER — IPRATROPIUM-ALBUTEROL 0.5-2.5 (3) MG/3ML IN SOLN
3.0000 mL | Freq: Two times a day (BID) | RESPIRATORY_TRACT | Status: DC
  Administered 2016-07-19 – 2016-07-22 (×6): 3 mL via RESPIRATORY_TRACT
  Filled 2016-07-18 (×7): qty 3

## 2016-07-18 MED ORDER — VANCOMYCIN HCL IN DEXTROSE 1-5 GM/200ML-% IV SOLN
1000.0000 mg | Freq: Two times a day (BID) | INTRAVENOUS | Status: DC
  Administered 2016-07-19 – 2016-07-21 (×6): 1000 mg via INTRAVENOUS
  Filled 2016-07-18 (×5): qty 200

## 2016-07-18 NOTE — Progress Notes (Signed)
PROGRESS NOTE    [name]  [id number] DOB: [birth year] DOA: 07/12/2016 PCP: Blanchie Serve, MD     Brief Narrative:  [name] is a 56 yo female with past medical history of multiple sclerosis, history of DVT on Xarelto, hx of bilateral obstructing nephrolithiasis s/p bilateral perc nephrostomy tube in July 2017 and December 2017, who presented to the emergency department due to fevers. She was encephalopathic at time of admission. In the emergency department, she had a fever of 103, hypotensive, lactic acidosis, and hemoglobin of 4. She was initially admitted to [facility] services, with presumed sepsis secondary to UTI. She was treated with IV antibiotics, given blood products. She did require pressors for blood pressure support   Assessment & Plan:   Active Problems:   Septic shock (HCC)  Shock, multifactorial -Septic as well as hemorrhagic -Required levophed, her pressure is now stable now off pressors -Initially thought source to be septic secondary to UTI. She was started on IV Vanco and Zosyn, but urine culture has now resulted with multiple organisms, UA does show large leuk, numerous WBC, many bacteria. Previous urine culture has shown pan-sensitive E Coli and Pseudomonas, resistant providencia  -CT renal stone on 1/30 with questionable cystitis, questionable diverticulitis or colitis  -Due to diarrhea, C. difficile PCR was ordered as well as oral vancomycin. However, before sample could be obtained, she stopped having diarrhea. Therefore PCR test, enteric precaution, oral vanco are stopped.  -Blood culture with 1 of 2 coag neg staph, likely contaminant  -Influenza negative  -CT abd/pelvis obtained yesterday due to continued leukocytosis, this showed multiloculated pelvic fluid collection  -Appreciate general surgery, has now signed off  -Urology consulted: likely pelvic abscess vs urinoma. Continue nephrostomy tubes and foley to allow drainage of urine away from bladder for  healing  -Continue zosyn for now. Plan to deescalate to oral once leukocytosis improving and afebrile. Had fever last night.   Acute metabolic encephalopathy -Now resolved and at baseline  Acute normocytic anemia on chronic normocytic anemia -Baseline Hgb 8.2-9.5 -Unclear etiology, had hemoccult positive stool but no report of melena or hematochezia to explain Hgb 4 on admission  -Received 2u pRBC on 1/30  -Iron studies are not consistent with iron deficiency anemia -INR now normal, LDH normal, fibrinogen elevated, Haptoglobin 308. Not consistent with hemolytic anemia or DIC  -Vit B 12: 2,254. Folate pending  -Continue to trend CBC. No reports of hematochezia/melena this admission  -Suspect she will require GI evaluation at some point once her acute illness resolve   AKI  -Baseline Cr 0.5-0.7, Cr was 1.75 on admission -Resolved with IVF   History of DVT -Xarelto was held on admission due to anemia -Currently on SCDs  Hx bilateral obstructing nephrolithiasis s/p nephrostomy tube  -Managed by IR outpatient   DM type 2, well controlled  -Hold oral home meds  -Ha1c 5.3 -ISS  Essential HTN -Holding home antihypertensives due to shock on admission, BP currently stable   Hypokalemia -Replace and trend    DVT prophylaxis: SCDs Code Status: Full Family Communication: no family at bedside Disposition Plan: pending further stabilization, improvement. Back to SNF    Consultants:   PCCM  General surgery  Urology   Procedures:   None  Antimicrobials:  Anti-infectives    Start     Dose/Rate Route Frequency Ordered Stop   07/13/16 1400  vancomycin (VANCOCIN) IVPB 1000 mg/200 mL premix  Status:  Discontinued     1,000 mg 200 mL/hr over 60  Minutes Intravenous Every 24 hours 07/12/16 2058 07/14/16 1109   07/13/16 1200  vancomycin (VANCOCIN) IVPB 1000 mg/200 mL premix  Status:  Discontinued     1,000 mg 200 mL/hr over 60 Minutes Intravenous Every 24 hours 07/12/16 1412  07/12/16 2051   07/13/16 0000  vancomycin (VANCOCIN) 50 mg/mL oral solution 500 mg  Status:  Discontinued     500 mg Oral Every 6 hours 07/12/16 2051 07/15/16 1328   07/12/16 2200  piperacillin-tazobactam (ZOSYN) IVPB 3.375 g  Status:  Discontinued     3.375 g 12.5 mL/hr over 240 Minutes Intravenous Every 8 hours 07/12/16 1412 07/12/16 2051   07/12/16 2200  piperacillin-tazobactam (ZOSYN) IVPB 3.375 g     3.375 g 12.5 mL/hr over 240 Minutes Intravenous Every 8 hours 07/12/16 2058     07/12/16 1315  piperacillin-tazobactam (ZOSYN) IVPB 3.375 g     3.375 g 100 mL/hr over 30 Minutes Intravenous  Once 07/12/16 1314 07/12/16 1356   07/12/16 1315  vancomycin (VANCOCIN) IVPB 1000 mg/200 mL premix     1,000 mg 200 mL/hr over 60 Minutes Intravenous  Once 07/12/16 1314 07/12/16 1512       Subjective: Patient with no new complaints today. No acute events overnight.   Objective: Vitals:   07/17/16 2053 07/18/16 0427 07/18/16 0512 07/18/16 0624  BP: (!) 158/70 (!) 143/84    Pulse: (!) 103 100    Resp: 18 18    Temp: 99.4 F (37.4 C) (!) 100.8 F (38.2 C)  98.6 F (37 C)  TempSrc: Oral Oral  Oral  SpO2: 98% 94%    Weight:   71.8 kg (158 lb 4.6 oz)   Height:        Intake/Output Summary (Last 24 hours) at 07/18/16 1142 Last data filed at 07/18/16 1119  Gross per 24 hour  Intake              140 ml  Output             2050 ml  Net            -1910 ml   Filed Weights   07/16/16 0931 07/17/16 0519 07/18/16 0512  Weight: 68.6 kg (151 lb 3.8 oz) 71.2 kg (156 lb 15.5 oz) 71.8 kg (158 lb 4.6 oz)    Examination:  General exam: Appears calm and comfortable  Respiratory system: Clear to auscultation. Respiratory effort normal. Cardiovascular system: S1 & S2 heard, RRR. No JVD, murmurs, rubs, gallops or clicks. No pedal edema. Gastrointestinal system: Abdomen is nondistended, soft, +TTP lower abdomen but is a little better today. No organomegaly or masses felt. Normal bowel sounds  heard. Gu: Bilateral nephrostomy tubes present  Central nervous system: Alert and oriented. Nonfocal. +MS  Extremities: Symmetric Skin: No rashes, lesions or ulcers, +chronic bilateral feet with venous stasis skin changes  Psychiatry: Judgement and insight appear normal. Mood & affect appropriate.   Data Reviewed: I have personally reviewed following labs and imaging studies  CBC:  Recent Labs Lab 07/12/16 1239  07/14/16 0404 07/15/16 0654 07/16/16 0749 07/17/16 0342 07/18/16 0349  WBC 18.9*  < > 16.6* 19.6* 16.7* 14.2* 15.3*  NEUTROABS 16.5*  --   --   --  13.1* 9.2* 10.6*  HGB 4.8*  < > 9.5* 8.4* 7.9* 7.8* 8.2*  HCT 14.7*  < > 27.5* 24.8* 23.0* 23.7* 25.4*  MCV 89.6  < > 87.6 87.9 87.8 87.5 87.6  PLT 340  < > 297 285 310 369  441*  < > = values in this interval not displayed. Basic Metabolic Panel:  Recent Labs Lab 07/14/16 0404 07/15/16 0500 07/16/16 0749 07/17/16 0342 07/18/16 0349  NA 140 140 143 144 144  K 3.0* 3.9 3.0* 3.3* 3.2*  CL 114* 113* 115* 119* 117*  CO2 19* 21* 21* 22 22  GLUCOSE 107* 92 109* 203* 106*  BUN 22* 14 12 7  <5*  CREATININE 1.13* 0.88 1.01* 0.82 0.59  CALCIUM 8.9 9.3 9.3 9.0 9.1  MG  --  1.7  --   --   --   PHOS  --  2.6  --   --   --    GFR: Estimated Creatinine Clearance: 77 mL/min (by C-G formula based on SCr of 0.59 mg/dL). Liver Function Tests:  Recent Labs Lab 07/12/16 1239 07/16/16 0749  AST 32 30  ALT 11* 47  ALKPHOS 41 136*  BILITOT 1.3* 0.6  PROT 5.2* 6.0*  ALBUMIN 2.0* 1.9*   No results for input(s): LIPASE, AMYLASE in the last 168 hours. No results for input(s): AMMONIA in the last 168 hours. Coagulation Profile:  Recent Labs Lab 07/12/16 1239 07/15/16 0845  INR 5.27* 1.16   Cardiac Enzymes: No results for input(s): CKTOTAL, CKMB, CKMBINDEX, TROPONINI in the last 168 hours. BNP (last 3 results) No results for input(s): PROBNP in the last 8760 hours. HbA1C: No results for input(s): HGBA1C in the last 72  hours. CBG:  Recent Labs Lab 07/17/16 0738 07/17/16 1156 07/17/16 1737 07/17/16 2126 07/18/16 0725  GLUCAP 116* 130* 110* 121* 97   Lipid Profile: No results for input(s): CHOL, HDL, LDLCALC, TRIG, CHOLHDL, LDLDIRECT in the last 72 hours. Thyroid Function Tests: No results for input(s): TSH, T4TOTAL, FREET4, T3FREE, THYROIDAB in the last 72 hours. Anemia Panel: No results for input(s): VITAMINB12, FOLATE, FERRITIN, TIBC, IRON, RETICCTPCT in the last 72 hours. Sepsis Labs:  Recent Labs Lab 07/12/16 1321 07/12/16 1420 07/12/16 1754 07/12/16 2104  LATICACIDVEN 2.07* 3.22* 1.41 1.1    Recent Results (from the past 240 hour(s))  Culture, blood (Routine x 2)     Status: Abnormal   Collection Time: 07/12/16  1:13 PM  Result Value Ref Range Status   Specimen Description BLOOD LEFT ANTECUBITAL  Final   Special Requests BOTTLES DRAWN AEROBIC AND ANAEROBIC 5CC  Final   Culture  Setup Time   Final    GRAM POSITIVE COCCI IN CLUSTERS ANAEROBIC BOTTLE ONLY CRITICAL RESULT CALLED TO, READ BACK BY AND VERIFIED WITH: C. Shade Pharm.D. 15:15 07/13/16 ([email or web address])    Culture (A)  Final    STAPHYLOCOCCUS SPECIES (COAGULASE NEGATIVE) THE SIGNIFICANCE OF ISOLATING THIS ORGANISM FROM A SINGLE SET OF BLOOD CULTURES WHEN MULTIPLE SETS ARE DRAWN IS UNCERTAIN. PLEASE NOTIFY THE MICROBIOLOGY DEPARTMENT WITHIN ONE WEEK IF SPECIATION AND SENSITIVITIES ARE REQUIRED. Performed at [facility], [place] [address] [place], [place] [address]    Report Status 07/16/2016 FINAL  Final  Culture, blood (Routine x 2)     Status: None   Collection Time: 07/12/16  1:13 PM  Result Value Ref Range Status   Specimen Description BLOOD LEFT ANTECUBITAL  Final   Special Requests BOTTLES DRAWN AEROBIC AND ANAEROBIC 5CC  Final   Culture   Final    NO GROWTH 5 DAYS Performed at [facility], [place] [address], [place], [place] [address]    Report Status 07/17/2016 FINAL  Final  Blood Culture ID Panel  (Reflexed)     Status: Abnormal  Collection Time: 07/12/16  1:13 PM  Result Value Ref Range Status   Enterococcus species NOT DETECTED NOT DETECTED Final   Listeria monocytogenes NOT DETECTED NOT DETECTED Final   Staphylococcus species DETECTED (A) NOT DETECTED Final    Comment: Methicillin (oxacillin) susceptible coagulase negative staphylococcus. Possible blood culture contaminant (unless isolated from more than one blood culture draw or clinical case suggests pathogenicity). No antibiotic treatment is indicated for blood  culture contaminants. CRITICAL RESULT CALLED TO, READ BACK BY AND VERIFIED WITH: C. Shade Pharm.D. 15:15 07/13/16 ([email or web address])    Staphylococcus aureus NOT DETECTED NOT DETECTED Final   Methicillin resistance NOT DETECTED NOT DETECTED Final   Streptococcus species NOT DETECTED NOT DETECTED Final   Streptococcus agalactiae NOT DETECTED NOT DETECTED Final   Streptococcus pneumoniae NOT DETECTED NOT DETECTED Final   Streptococcus pyogenes NOT DETECTED NOT DETECTED Final   Acinetobacter baumannii NOT DETECTED NOT DETECTED Final   Enterobacteriaceae species NOT DETECTED NOT DETECTED Final   Enterobacter cloacae complex NOT DETECTED NOT DETECTED Final   Escherichia coli NOT DETECTED NOT DETECTED Final   Klebsiella oxytoca NOT DETECTED NOT DETECTED Final   Klebsiella pneumoniae NOT DETECTED NOT DETECTED Final   Proteus species NOT DETECTED NOT DETECTED Final   Serratia marcescens NOT DETECTED NOT DETECTED Final   Haemophilus influenzae NOT DETECTED NOT DETECTED Final   Neisseria meningitidis NOT DETECTED NOT DETECTED Final   Pseudomonas aeruginosa NOT DETECTED NOT DETECTED Final   Candida albicans NOT DETECTED NOT DETECTED Final   Candida glabrata NOT DETECTED NOT DETECTED Final   Candida krusei NOT DETECTED NOT DETECTED Final   Candida parapsilosis NOT DETECTED NOT DETECTED Final   Candida tropicalis NOT DETECTED NOT DETECTED Final    Comment: Performed at [facility], [place] [address] [place], [place] [address]  Urine culture     Status: Abnormal   Collection Time: 07/12/16  5:33 PM  Result Value Ref Range Status   Specimen Description OTHER  Final   Special Requests NONE  Final   Culture MULTIPLE SPECIES PRESENT, SUGGEST RECOLLECTION (A)  Final   Report Status 07/14/2016 FINAL  Final  MRSA PCR Screening     Status: None   Collection Time: 07/12/16  8:53 PM  Result Value Ref Range Status   MRSA by PCR NEGATIVE NEGATIVE Final    Comment:        The GeneXpert MRSA Assay (FDA approved for NASAL specimens only), is one component of a comprehensive MRSA colonization surveillance program. It is not intended to diagnose MRSA infection nor to guide or monitor treatment for MRSA infections.        Radiology Studies: No results found.    Scheduled Meds: . atorvastatin  10 mg Oral QHS  . famotidine  20 mg Oral BID  . hydrocerin   Topical BID  . insulin aspart  0-9 Units Subcutaneous TID WC  . piperacillin-tazobactam (ZOSYN)  IV  3.375 g Intravenous Q8H   Continuous Infusions: . sodium chloride 75 mL/hr at 07/17/16 2150     LOS: 6 days    Time spent: 30 minutes   Dessa Phi, DO [facility] [email or web address] Password [email or web address] 07/18/2016, 11:42 AM

## 2016-07-18 NOTE — Progress Notes (Signed)
Pt diaphoretic, dyspneic at rest w/ expiratory wheezing, temp 102.4, HR 118, BP 158/88 MD paged, new orders received and medication given.  Will continue to monitor. Kizzie Ide, RN

## 2016-07-18 NOTE — Progress Notes (Addendum)
Checked in on patient this afternoon. Patient's main complaint is fatigue. She had a fever 102.4 as well as tachycardia and elevated Bp. On exam, she is sitting comfortably in bed without acute respiratory distress. RN had just given her tylenol and breathing treatment. CXR was obtained which showed patchy left mid-lower lung infiltrate with small left pleural effusion, which I do not appreciate from CXR 1/30. Patient is currently on zosyn but continues to have fevers. Will add vanco and monitor.   Dessa Phi, DO [facility] [email or web address] Password [facility] 07/18/2016, 5:56 PM

## 2016-07-18 NOTE — Care Management Important Message (Signed)
Important Message  Patient Details  Name: [name] MRN: [id number] Date of Birth: 05-10-1961   Medicare Important Message Given:  Yes    Kerin Salen 07/18/2016, 1:00 [place] Message  Patient Details  Name: [name] MRN: [id number] Date of Birth: [birth year]   Medicare Important Message Given:  Yes    Kerin Salen 07/18/2016, 1:00 PM

## 2016-07-18 NOTE — Progress Notes (Signed)
Pharmacy Antibiotic Note  [name] is a 56 y.o. female admitted on 07/12/2016 with sepsis.  Pharmacy has been consulted for Vanc/Zosyn dosing.  Today, 07/18/2016:  D7 abx  Pt with fever last night  WBC remain elevated  Scr WNL at 0.59  Plan:  Continue Zosyn 3.375 g IV extended infusion q8 hr   Height: 5\' 5"  (165.1 cm) Weight: 158 lb 4.6 oz (71.8 kg) IBW/kg (Calculated) : 57  Temp (24hrs), Avg:99.6 F (37.6 C), Min:98.6 F (37 C), Max:100.8 F (38.2 C)   Recent Labs Lab 07/12/16 1321 07/12/16 1420 07/12/16 1754 07/12/16 2104  07/14/16 0404 07/15/16 0500 07/15/16 0654 07/16/16 0749 07/17/16 0342 07/18/16 0349  WBC  --   --   --   --   < > 16.6*  --  19.6* 16.7* 14.2* 15.3*  CREATININE  --   --   --   --   < > 1.13* 0.88  --  1.01* 0.82 0.59  LATICACIDVEN 2.07* 3.22* 1.41 1.1  --   --   --   --   --   --   --   < > = values in this interval not displayed.  Estimated Creatinine Clearance: 77 mL/min (by C-G formula based on SCr of 0.59 mg/dL).    No Known Allergies   Antimicrobials this admission: 1/30 Vanc >> 2/1 1/30 Zosyn >> 1/30 PO Vanc >>  Microbiology results: 1/30 BCx: 1/2 GPC in clusters      1/31:  BCID: 1/2 MS-CoNS  --> Likely contaminant, No changes 1/30 UCx: multiple species, suggest recollection 1/30 influenza neg 1/30 MRSA screen: neg Cdiff: unable to be collected   Thank you for allowing pharmacy to be a part of this patient's care.    Royetta Asal, PharmD, BCPS Pager [phone] 07/18/2016 2:34 PM

## 2016-07-18 NOTE — Progress Notes (Signed)
LCSWA will continue to assist with patient needs, and assist with discharge back to [facility] when medically stable.   Kathrin Greathouse, Latanya Presser, MSW Clinical Social Worker [place] [facility] [phone] 07/18/2016  4:00 PM

## 2016-07-18 NOTE — Progress Notes (Signed)
Pharmacy Antibiotic Note  [name] is a 56 y.o. female admitted on 07/12/2016 with septic shock. Patient currently on day #7 of Zosyn. Patient with fever of 102.4, and CXR showing patchy left mid-lower lung infiltrate. MD adding coverage for PNA with Vancomycin, and pharmacy consulted to assist with dosing.   Plan:  Vancomycin 1g IV q12h. Plan for Vancomycin trough level at steady state, as clinically warranted.  Continue Zosyn 3.375g IV q8h (infuse over 4 hours).  Monitor renal function, cultures, clinical course.    Height: 5\' 5"  (165.1 cm) Weight: 158 lb 4.6 oz (71.8 kg) IBW/kg (Calculated) : 57  Temp (24hrs), Avg:100.2 F (37.9 C), Min:98.6 F (37 C), Max:102.4 F (39.1 C)   Recent Labs Lab 07/12/16 1321 07/12/16 1420 07/12/16 1754 07/12/16 2104  07/14/16 0404 07/15/16 0500 07/15/16 0654 07/16/16 0749 07/17/16 0342 07/18/16 0349  WBC  --   --   --   --   < > 16.6*  --  19.6* 16.7* 14.2* 15.3*  CREATININE  --   --   --   --   < > 1.13* 0.88  --  1.01* 0.82 0.59  LATICACIDVEN 2.07* 3.22* 1.41 1.1  --   --   --   --   --   --   --   < > = values in this interval not displayed.  Estimated Creatinine Clearance: 77 mL/min (by C-G formula based on SCr of 0.59 mg/dL).    No Known Allergies   Antimicrobials this admission: 1/30 Vanc >> 2/1, restarted 2/5 >> 1/30 Zosyn >> 1/30 PO Vanc >> 2/2  Microbiology results: 1/30 BCx: 1/2 GPC in clusters      1/31:  BCID: 1/2 MS-CoNS  --> Likely contaminant 1/30 UCx: multiple species, suggest recollection 1/30 influenza PCR: neg 1/30 MRSA PCR: neg Cdiff: unable to be collected   Thank you for allowing pharmacy to be a part of this patient's care.   Lindell Spar, PharmD, BCPS Pager: [phone] 07/18/2016 6:17 PM

## 2016-07-19 DIAGNOSIS — I10 Essential (primary) hypertension: Secondary | ICD-10-CM | POA: Diagnosis present

## 2016-07-19 DIAGNOSIS — D638 Anemia in other chronic diseases classified elsewhere: Secondary | ICD-10-CM | POA: Diagnosis present

## 2016-07-19 DIAGNOSIS — G9341 Metabolic encephalopathy: Secondary | ICD-10-CM | POA: Diagnosis present

## 2016-07-19 LAB — BASIC METABOLIC PANEL WITH GFR
Anion gap: 3 — ABNORMAL LOW (ref 5–15)
BUN: <5 mg/dL — ABNORMAL LOW (ref 6–20)
CO2: 24 mmol/L (ref 22–32)
Calcium: 8.8 mg/dL — ABNORMAL LOW (ref 8.9–10.3)
Chloride: 116 mmol/L — ABNORMAL HIGH (ref 101–111)
Creatinine, Ser: 0.62 mg/dL (ref 0.44–1.00)
GFR calc Af Amer: >60 mL/min
GFR calc non Af Amer: >60 mL/min
Glucose, Bld: 88 mg/dL (ref 65–99)
Potassium: 3.2 mmol/L — ABNORMAL LOW (ref 3.5–5.1)
Sodium: 143 mmol/L (ref 135–145)

## 2016-07-19 LAB — CBC WITH DIFFERENTIAL/PLATELET
Basophils Absolute: 0.1 10*3/uL (ref 0.0–0.1)
Basophils Relative: 1 %
Eosinophils Absolute: 0.5 10*3/uL (ref 0.0–0.7)
Eosinophils Relative: 4 %
HCT: 24.8 % — ABNORMAL LOW (ref 36.0–46.0)
Hemoglobin: 8.2 g/dL — ABNORMAL LOW (ref 12.0–15.0)
Lymphocytes Relative: 19 %
Lymphs Abs: 2.4 10*3/uL (ref 0.7–4.0)
MCH: 29.2 pg (ref 26.0–34.0)
MCHC: 33.1 g/dL (ref 30.0–36.0)
MCV: 88.3 fL (ref 78.0–100.0)
Monocytes Absolute: 0.5 10*3/uL (ref 0.1–1.0)
Monocytes Relative: 4 %
Neutro Abs: 9 10*3/uL — ABNORMAL HIGH (ref 1.7–7.7)
Neutrophils Relative %: 72 %
Platelets: 443 10*3/uL — ABNORMAL HIGH (ref 150–400)
RBC: 2.81 MIL/uL — ABNORMAL LOW (ref 3.87–5.11)
RDW: 17.2 % — ABNORMAL HIGH (ref 11.5–15.5)
WBC: 12.5 10*3/uL — ABNORMAL HIGH (ref 4.0–10.5)

## 2016-07-19 LAB — GLUCOSE, CAPILLARY
GLUCOSE-CAPILLARY: 151 mg/dL — AB (ref 65–99)
GLUCOSE-CAPILLARY: 118 mg/dL — AB (ref 65–99)
Glucose-Capillary: 98 mg/dL (ref 65–99)
Glucose-Capillary: 145 mg/dL — ABNORMAL HIGH (ref 65–99)

## 2016-07-19 MED ORDER — POTASSIUM CHLORIDE CRYS ER 20 MEQ PO TBCR
40.0000 meq | EXTENDED_RELEASE_TABLET | ORAL | Status: AC
  Administered 2016-07-19 (×2): 40 meq via ORAL
  Filled 2016-07-19 (×2): qty 2

## 2016-07-19 NOTE — Progress Notes (Signed)
PROGRESS NOTE    AVIENDA Roach  [id number] DOB: 04/07/1961 DOA: 07/12/2016 PCP: Blanchie Serve, MD     Brief Narrative:  [name] is a 56 yo female with past medical history of multiple sclerosis, history of DVT on Xarelto, hx of bilateral obstructing nephrolithiasis s/p bilateral perc nephrostomy tube in July 2017 and December 2017, who presented to the emergency department due to fevers. She was encephalopathic at time of admission. In the emergency department, she had a fever of 103, hypotensive, lactic acidosis, and hemoglobin of 4. She was initially admitted to [facility] services, with presumed sepsis secondary to UTI. She was treated with IV antibiotics, given blood products. She did require pressors for blood pressure support. She was transferred to [facility] with stabilization of BP on 07/15/16.  CT abdomen/pelvis was obtained as patient continued with leukocytosis. This showed multiloculated pelvic fluid collection. General surgery as well as urology were consulted. General surgery did not feel this was related to her bowel and has since signed off. Urology feels this is likely pelvic abscess vs urinoma. They recommend nephrostomy tubes bilaterally (these were in place prior to this admission) as well as foley in order to allow drainage of urine away from the bladder to allow healing. She has been on zosyn.   On evening of 2/5, she started to spike fevers with respiratory complaints. CXR obtained showed LLL infiltrate. Vanco was added to zosyn.    Assessment & Plan:   Principal Problem:   Septic shock ([place]) Active Problems:   Multiple sclerosis diagnosed 2002-not on Therapy any longer   DVT, lower extremity ([facility])   Chronic indwelling Foley catheter   AKI (acute kidney injury) ([place])   Controlled diabetes mellitus type 2 with complications ([place])   Pelvic abscess in female   Acute metabolic encephalopathy   Anemia of chronic disease   Essential hypertension  Shock,  multifactorial -Septic as well as hemorrhagic -Required levophed, her pressure is now stable now off pressors -Initially thought source to be septic secondary to UTI. She was started on IV Vanco and Zosyn, but urine culture has now resulted with multiple organisms, UA does show large leuk, numerous WBC, many bacteria. Previous urine culture has shown pan-sensitive E Coli and Pseudomonas, resistant providencia  -CT renal stone on 1/30 with questionable cystitis, questionable diverticulitis or colitis  -Due to diarrhea, C. difficile PCR was ordered as well as oral vancomycin. However, before sample could be obtained, she stopped having diarrhea. Therefore PCR test, enteric precaution, oral vanco are stopped.  -Blood culture with 1 of 2 coag neg staph, likely contaminant  -Influenza negative  -CT abd/pelvis obtained due to continued leukocytosis, this showed multiloculated pelvic fluid collection  -Appreciate general surgery, has now signed off  -Urology consulted: likely pelvic abscess vs urinoma. Continue nephrostomy tubes and foley to allow drainage of urine away from bladder for healing  -Continue zosyn for now -Repeat blood culture pending   LLL HCAP -Respiratory complaints started 2/5. Re-started vanco along with zosyn continued. Watch leukocytosis, fever curve   Acute metabolic encephalopathy -Now resolved and at baseline, but remains poor historian   Acute normocytic anemia on chronic normocytic anemia -Baseline Hgb 8.2-9.5 -Unclear etiology, had hemoccult positive stool but no report of melena or hematochezia to explain Hgb 4 on admission  -Received 2u pRBC on 1/30  -Iron studies are not consistent with iron deficiency anemia -INR now normal, LDH normal, fibrinogen elevated, Haptoglobin 308. Not consistent with hemolytic anemia or DIC  -Vit B  12: 2,254. Folate: 1,996 -Continue to trend CBC. No reports of hematochezia/melena this admission. Hgb remains stable.  -Suspect she will  require GI evaluation at some point once her acute illness resolves   AKI  -Baseline Cr 0.5-0.7, Cr was 1.75 on admission -Resolved with IVF   History of DVT -Xarelto was held on admission due to anemia -Currently on SCDs  DM type 2, well controlled  -Hold oral home meds  -Ha1c 5.3 -ISS  Essential HTN -Holding home antihypertensives due to shock on admission, BP currently stable   Hypokalemia -Replace and trend    DVT prophylaxis: SCDs Code Status: Full Family Communication: no family at bedside Disposition Plan: pending further stabilization, improvement. Back to SNF [facility])   Consultants:   PCCM  General surgery  Urology   Procedures:   None  Antimicrobials:  Anti-infectives    Start     Dose/Rate Route Frequency Ordered Stop   07/19/16 0600  vancomycin (VANCOCIN) IVPB 1000 mg/200 mL premix     1,000 mg 200 mL/hr over 60 Minutes Intravenous Every 12 hours 07/18/16 1810     07/18/16 1815  vancomycin (VANCOCIN) IVPB 1000 mg/200 mL premix     1,000 mg 200 mL/hr over 60 Minutes Intravenous STAT 07/18/16 1810 07/18/16 1940   07/13/16 1400  vancomycin (VANCOCIN) IVPB 1000 mg/200 mL premix  Status:  Discontinued     1,000 mg 200 mL/hr over 60 Minutes Intravenous Every 24 hours 07/12/16 2058 07/14/16 1109   07/13/16 1200  vancomycin (VANCOCIN) IVPB 1000 mg/200 mL premix  Status:  Discontinued     1,000 mg 200 mL/hr over 60 Minutes Intravenous Every 24 hours 07/12/16 1412 07/12/16 2051   07/13/16 0000  vancomycin (VANCOCIN) 50 mg/mL oral solution 500 mg  Status:  Discontinued     500 mg Oral Every 6 hours 07/12/16 2051 07/15/16 1328   07/12/16 2200  piperacillin-tazobactam (ZOSYN) IVPB 3.375 g  Status:  Discontinued     3.375 g 12.5 mL/hr over 240 Minutes Intravenous Every 8 hours 07/12/16 1412 07/12/16 2051   07/12/16 2200  piperacillin-tazobactam (ZOSYN) IVPB 3.375 g     3.375 g 12.5 mL/hr over 240 Minutes Intravenous Every 8 hours 07/12/16 2058      07/12/16 1315  piperacillin-tazobactam (ZOSYN) IVPB 3.375 g     3.375 g 100 mL/hr over 30 Minutes Intravenous  Once 07/12/16 1314 07/12/16 1356   07/12/16 1315  vancomycin (VANCOCIN) IVPB 1000 mg/200 mL premix     1,000 mg 200 mL/hr over 60 Minutes Intravenous  Once 07/12/16 1314 07/12/16 1512       Subjective: Patient with no new complaints today. Breathing seems improved with breathing treatments. Continues to have abdominal pain with palpation   Objective: Vitals:   07/18/16 2254 07/19/16 0500 07/19/16 0604 07/19/16 0856  BP:   (!) 150/77   Pulse:   89   Resp:   18   Temp: 98.9 F (37.2 C)  98.7 F (37.1 C)   TempSrc:   Oral   SpO2:   98% 99%  Weight:  69.6 kg (153 lb 7 oz)    Height:        Intake/Output Summary (Last 24 hours) at 07/19/16 1107 Last data filed at 07/19/16 1016  Gross per 24 hour  Intake              900 ml  Output             2200 ml  Net            -  1300 ml   Filed Weights   07/17/16 0519 07/18/16 0512 07/19/16 0500  Weight: 71.2 kg (156 lb 15.5 oz) 71.8 kg (158 lb 4.6 oz) 69.6 kg (153 lb 7 oz)    Examination:  General exam: Appears calm and comfortable  Respiratory system: Clear to auscultation, minimal crackles on left. Respiratory effort normal. Cardiovascular system: S1 & S2 heard, RRR. No JVD, murmurs, rubs, gallops or clicks. No pedal edema. Gastrointestinal system: Abdomen is nondistended, soft, +TTP lower abdomen, same as previous exam. No organomegaly or masses felt. Normal bowel sounds heard. Gu: Bilateral nephrostomy tubes present, foley in place  Central nervous system: Alert and oriented. Nonfocal. +MS  Extremities: Symmetric Skin: No rashes, lesions or ulcers, +chronic bilateral feet with venous stasis skin changes  Psychiatry: Judgement and insight appear normal. Mood & affect appropriate.   Data Reviewed: I have personally reviewed following labs and imaging studies  CBC:  Recent Labs Lab 07/12/16 1239  07/15/16 0654  07/15/16 0930 07/16/16 0749 07/17/16 0342 07/18/16 0349 07/19/16 0426  WBC 18.9*  < > 19.6*  --  16.7* 14.2* 15.3* 12.5*  NEUTROABS 16.5*  --   --   --  13.1* 9.2* 10.6* 9.0*  HGB 4.8*  < > 8.4*  --  7.9* 7.8* 8.2* 8.2*  HCT 14.7*  < > 24.8* 27.2* 23.0* 23.7* 25.4* 24.8*  MCV 89.6  < > 87.9  --  87.8 87.5 87.6 88.3  PLT 340  < > 285  --  310 369 441* 443*  < > = values in this interval not displayed. Basic Metabolic Panel:  Recent Labs Lab 07/15/16 0500 07/16/16 0749 07/17/16 0342 07/18/16 0349 07/19/16 0426  NA 140 143 144 144 143  K 3.9 3.0* 3.3* 3.2* 3.2*  CL 113* 115* 119* 117* 116*  CO2 21* 21* 22 22 24   GLUCOSE 92 109* 203* 106* 88  BUN 14 12 7  <5* <5*  CREATININE 0.88 1.01* 0.82 0.59 0.62  CALCIUM 9.3 9.3 9.0 9.1 8.8*  MG 1.7  --   --   --   --   PHOS 2.6  --   --   --   --    GFR: Estimated Creatinine Clearance: 75.9 mL/min (by C-G formula based on SCr of 0.62 mg/dL). Liver Function Tests:  Recent Labs Lab 07/12/16 1239 07/16/16 0749  AST 32 30  ALT 11* 47  ALKPHOS 41 136*  BILITOT 1.3* 0.6  PROT 5.2* 6.0*  ALBUMIN 2.0* 1.9*   No results for input(s): LIPASE, AMYLASE in the last 168 hours. No results for input(s): AMMONIA in the last 168 hours. Coagulation Profile:  Recent Labs Lab 07/12/16 1239 07/15/16 0845  INR 5.27* 1.16   Cardiac Enzymes: No results for input(s): CKTOTAL, CKMB, CKMBINDEX, TROPONINI in the last 168 hours. BNP (last 3 results) No results for input(s): PROBNP in the last 8760 hours. HbA1C: No results for input(s): HGBA1C in the last 72 hours. CBG:  Recent Labs Lab 07/18/16 0725 07/18/16 1209 07/18/16 1713 07/18/16 2111 07/19/16 0734  GLUCAP 97 161* 147* 121* 98   Lipid Profile: No results for input(s): CHOL, HDL, LDLCALC, TRIG, CHOLHDL, LDLDIRECT in the last 72 hours. Thyroid Function Tests: No results for input(s): TSH, T4TOTAL, FREET4, T3FREE, THYROIDAB in the last 72 hours. Anemia Panel: No results for  input(s): VITAMINB12, FOLATE, FERRITIN, TIBC, IRON, RETICCTPCT in the last 72 hours. Sepsis Labs:  Recent Labs Lab 07/12/16 1321 07/12/16 1420 07/12/16 1754 07/12/16 2104  LATICACIDVEN 2.07* 3.22* 1.41  1.1    Recent Results (from the past 240 hour(s))  Culture, blood (Routine x 2)     Status: Abnormal   Collection Time: 07/12/16  1:13 PM  Result Value Ref Range Status   Specimen Description BLOOD LEFT ANTECUBITAL  Final   Special Requests BOTTLES DRAWN AEROBIC AND ANAEROBIC 5CC  Final   Culture  Setup Time   Final    GRAM POSITIVE COCCI IN CLUSTERS ANAEROBIC BOTTLE ONLY CRITICAL RESULT CALLED TO, READ BACK BY AND VERIFIED WITH: C. Shade Pharm.D. 15:15 07/13/16 ([email or web address])    Culture (A)  Final    STAPHYLOCOCCUS SPECIES (COAGULASE NEGATIVE) THE SIGNIFICANCE OF ISOLATING THIS ORGANISM FROM A SINGLE SET OF BLOOD CULTURES WHEN MULTIPLE SETS ARE DRAWN IS UNCERTAIN. PLEASE NOTIFY THE MICROBIOLOGY DEPARTMENT WITHIN ONE WEEK IF SPECIATION AND SENSITIVITIES ARE REQUIRED. Performed at [facility], [place] [address], [place], [place] [address]    Report Status 07/16/2016 FINAL  Final  Culture, blood (Routine x 2)     Status: None   Collection Time: 07/12/16  1:13 PM  Result Value Ref Range Status   Specimen Description BLOOD LEFT ANTECUBITAL  Final   Special Requests BOTTLES DRAWN AEROBIC AND ANAEROBIC 5CC  Final   Culture   Final    NO GROWTH 5 DAYS Performed at [facility], [place] [address], [place], [place] [address]    Report Status 07/17/2016 FINAL  Final  Blood Culture ID Panel (Reflexed)     Status: Abnormal   Collection Time: 07/12/16  1:13 PM  Result Value Ref Range Status   Enterococcus species NOT DETECTED NOT DETECTED Final   Listeria monocytogenes NOT DETECTED NOT DETECTED Final   Staphylococcus species DETECTED (A) NOT DETECTED Final    Comment: Methicillin (oxacillin) susceptible coagulase negative staphylococcus. Possible blood culture contaminant (unless  isolated from more than one blood culture draw or clinical case suggests pathogenicity). No antibiotic treatment is indicated for blood  culture contaminants. CRITICAL RESULT CALLED TO, READ BACK BY AND VERIFIED WITH: C. Shade Pharm.D. 15:15 07/13/16 ([email or web address])    Staphylococcus aureus NOT DETECTED NOT DETECTED Final   Methicillin resistance NOT DETECTED NOT DETECTED Final   Streptococcus species NOT DETECTED NOT DETECTED Final   Streptococcus agalactiae NOT DETECTED NOT DETECTED Final   Streptococcus pneumoniae NOT DETECTED NOT DETECTED Final   Streptococcus pyogenes NOT DETECTED NOT DETECTED Final   Acinetobacter baumannii NOT DETECTED NOT DETECTED Final   Enterobacteriaceae species NOT DETECTED NOT DETECTED Final   Enterobacter cloacae complex NOT DETECTED NOT DETECTED Final   Escherichia coli NOT DETECTED NOT DETECTED Final   Klebsiella oxytoca NOT DETECTED NOT DETECTED Final   Klebsiella pneumoniae NOT DETECTED NOT DETECTED Final   Proteus species NOT DETECTED NOT DETECTED Final   Serratia marcescens NOT DETECTED NOT DETECTED Final   Haemophilus influenzae NOT DETECTED NOT DETECTED Final   Neisseria meningitidis NOT DETECTED NOT DETECTED Final   Pseudomonas aeruginosa NOT DETECTED NOT DETECTED Final   Candida albicans NOT DETECTED NOT DETECTED Final   Candida glabrata NOT DETECTED NOT DETECTED Final   Candida krusei NOT DETECTED NOT DETECTED Final   Candida parapsilosis NOT DETECTED NOT DETECTED Final   Candida tropicalis NOT DETECTED NOT DETECTED Final    Comment: Performed at [facility], [place] [address] [place], [place] [address]  Urine culture     Status: Abnormal   Collection Time: 07/12/16  5:33 PM  Result Value Ref Range Status   Specimen Description OTHER  Final   Special Requests NONE  Final   Culture MULTIPLE SPECIES PRESENT, SUGGEST RECOLLECTION (A)  Final   Report Status 07/14/2016 FINAL  Final  MRSA PCR Screening     Status: None   Collection Time: 07/12/16   8:53 PM  Result Value Ref Range Status   MRSA by PCR NEGATIVE NEGATIVE Final    Comment:        The GeneXpert MRSA Assay (FDA approved for NASAL specimens only), is one component of a comprehensive MRSA colonization surveillance program. It is not intended to diagnose MRSA infection nor to guide or monitor treatment for MRSA infections.        Radiology Studies: Dg Chest Port 1 View  Result Date: 07/18/2016 CLINICAL DATA:  Shortness of breath.  Wheezing.  Fever. EXAM: PORTABLE CHEST 1 VIEW COMPARISON:  July 14, 2016 FINDINGS: There is patchy airspace consolidation in the left mid lower lung zones with small left pleural effusion. Right lung is clear. Heart is upper normal in size with pulmonary vascularity within normal limits. No adenopathy. Central catheter tip is in the superior vena cava. No pneumothorax. There is a pigtail type catheter in the left upper quadrant region, incompletely visualized. IMPRESSION: Patchy infiltrate left mid lower lung zones with small left pleural effusion. Lungs elsewhere clear. Stable cardiac silhouette. Electronically Signed   By: Lowella Grip III M.D.   On: 07/18/2016 17:45      Scheduled Meds: . atorvastatin  10 mg Oral QHS  . famotidine  20 mg Oral BID  . hydrocerin   Topical BID  . insulin aspart  0-9 Units Subcutaneous TID WC  . ipratropium-albuterol  3 mL Nebulization BID  . piperacillin-tazobactam (ZOSYN)  IV  3.375 g Intravenous Q8H  . potassium chloride  40 mEq Oral Q4H  . vancomycin  1,000 mg Intravenous Q12H   Continuous Infusions:    LOS: 7 days    Time spent: 30 minutes   Dessa Phi, DO [facility] [email or web address] Password [email or web address] 07/19/2016, 11:07 AM

## 2016-07-20 DIAGNOSIS — J189 Pneumonia, unspecified organism: Secondary | ICD-10-CM

## 2016-07-20 LAB — GLUCOSE, CAPILLARY
GLUCOSE-CAPILLARY: 137 mg/dL — AB (ref 65–99)
GLUCOSE-CAPILLARY: 148 mg/dL — AB (ref 65–99)
Glucose-Capillary: 116 mg/dL — ABNORMAL HIGH (ref 65–99)
Glucose-Capillary: 155 mg/dL — ABNORMAL HIGH (ref 65–99)

## 2016-07-20 LAB — BASIC METABOLIC PANEL
ANION GAP: 4 — AB (ref 5–15)
BUN: 5 mg/dL — AB (ref 6–20)
CHLORIDE: 115 mmol/L — AB (ref 101–111)
CO2: 24 mmol/L (ref 22–32)
Calcium: 8.7 mg/dL — ABNORMAL LOW (ref 8.9–10.3)
Creatinine, Ser: 0.86 mg/dL (ref 0.44–1.00)
Glucose, Bld: 125 mg/dL — ABNORMAL HIGH (ref 65–99)
POTASSIUM: 3.7 mmol/L (ref 3.5–5.1)
SODIUM: 143 mmol/L (ref 135–145)

## 2016-07-20 LAB — CBC WITH DIFFERENTIAL/PLATELET
BASOS PCT: 0 %
Basophils Absolute: 0 10*3/uL (ref 0.0–0.1)
EOS ABS: 0.4 10*3/uL (ref 0.0–0.7)
Eosinophils Relative: 4 %
HCT: 25.2 % — ABNORMAL LOW (ref 36.0–46.0)
HEMOGLOBIN: 7.9 g/dL — AB (ref 12.0–15.0)
LYMPHS PCT: 25 %
Lymphs Abs: 2.7 10*3/uL (ref 0.7–4.0)
MCH: 27.5 pg (ref 26.0–34.0)
MCHC: 31.3 g/dL (ref 30.0–36.0)
MCV: 87.8 fL (ref 78.0–100.0)
Monocytes Absolute: 0.6 10*3/uL (ref 0.1–1.0)
Monocytes Relative: 6 %
NEUTROS PCT: 65 %
Neutro Abs: 6.9 10*3/uL (ref 1.7–7.7)
PLATELETS: 447 10*3/uL — AB (ref 150–400)
RBC: 2.87 MIL/uL — AB (ref 3.87–5.11)
RDW: 17.4 % — ABNORMAL HIGH (ref 11.5–15.5)
WBC: 10.6 10*3/uL — AB (ref 4.0–10.5)

## 2016-07-20 MED ORDER — BUDESONIDE 0.25 MG/2ML IN SUSP
0.2500 mg | Freq: Two times a day (BID) | RESPIRATORY_TRACT | Status: DC
  Administered 2016-07-20 – 2016-07-27 (×14): 0.25 mg via RESPIRATORY_TRACT
  Filled 2016-07-20 (×14): qty 2

## 2016-07-20 NOTE — Progress Notes (Signed)
LCSWA met with patient at bedside, inquired about patient wellbeing. Patient completing breathing treatment during visit. She reports she is feeling okay today and hopeful to return to SNF. She reports she was told by another resident at SNF that her room was given away at the facility. The patient reports she liked her room with the large window. Patient requested [place] call facility for update.  LCSWA spoke to [place] at [facility], she reports the patient bed was not held per family choice. She states she cannot guarantee patient will have a bed at discharge.   LCSWA explained this to patient. She reports feeling frustrated but understands. LCSWA discussed a back up plan, if [facility] does not have bed availability at discharge. Patient agreeable for LCSWA to fax clinical information to other facilities in area. She informed LCSWA about facilities she is not interested.   LCSWA will follow up with bed offers.

## 2016-07-20 NOTE — Progress Notes (Signed)
PROGRESS NOTE    [name]  [id number] DOB: 1961-04-11 DOA: 07/12/2016 PCP: Blanchie Serve, MD     Brief Narrative:  [name] is a 56 yo female with past medical history of multiple sclerosis, history of DVT on Xarelto, hx of bilateral obstructing nephrolithiasis s/p bilateral perc nephrostomy tube in July 2017 and December 2017, who presented to the emergency department due to fevers. She was encephalopathic at time of admission. In the emergency department, she had a fever of 103, hypotensive, lactic acidosis, and hemoglobin of 4. She was initially admitted to [facility] services, with presumed sepsis secondary to UTI. She was treated with IV antibiotics, given blood products. She did require pressors for blood pressure support. She was transferred to [facility] with stabilization of BP on 07/15/16.  CT abdomen/pelvis was obtained as patient continued with leukocytosis. This showed multiloculated pelvic fluid collection. General surgery as well as urology were consulted. General surgery did not feel this was related to her bowel and has since signed off. Urology feels this is likely pelvic abscess vs urinoma. They recommend nephrostomy tubes bilaterally (these were in place prior to this admission) as well as foley in order to allow drainage of urine away from the bladder to allow healing. She has been on zosyn.   On evening of 2/5, she started to spike fevers with respiratory complaints. CXR obtained showed LLL infiltrate. Vanco was added to zosyn.    Assessment & Plan:   Principal Problem:   Septic shock ([place]) Active Problems:   Multiple sclerosis diagnosed 2002-not on Therapy any longer   DVT, lower extremity (HCC)   Chronic indwelling Foley catheter   AKI (acute kidney injury) ([place])   Controlled diabetes mellitus type 2 with complications ([place])   Pelvic abscess in female   Acute metabolic encephalopathy   Anemia of chronic disease   Essential hypertension  Shock,  multifactorial -Septic as well as hemorrhagic -Required levophed, her pressure is now stable off pressors -Initially thought source to be secondary to UTI. She was started on IV Vanco and Zosyn, but urine culture has now resulted with multiple organisms, UA does show large leuk, numerous WBC, many bacteria. Previous urine culture has shown pan-sensitive E Coli and Pseudomonas, resistant providencia  -CT renal stone on 1/30 with questionable cystitis, questionable diverticulitis or colitis  -Due to diarrhea, C. difficile PCR was ordered as well as oral vancomycin. However, before sample could be obtained, she stopped having diarrhea. Therefore PCR test, enteric precaution, oral vanco are stopped.  -Blood culture with 1 of 2 coag neg staph, likely contaminant  -Influenza negative  -CT abd/pelvis obtained due to continued leukocytosis, this showed multiloculated pelvic fluid collection  -Urology consulted: likely pelvic abscess vs urinoma. Continue nephrostomy tubes and foley to allow drainage of urine away from bladder for healing  -Continue zosyn for now -Repeat blood culture pending  -also treating HCAP see below -Hgb and VS stable.  LLL HCAP -Respiratory complaints started 2/5.  -Re-started vanco along with zosyn; which will be continued. Watch leukocytosis and fever curve  -will weaned O2 supplementation as tolerated -continue PRN duoneb -will also start pulmicort and flutter valve   Acute metabolic encephalopathy -Now resolved and at baseline -most likely associated with fever and acute infection.   Acute on chronic normocytic anemia -Baseline Hgb 8.2-9.5 -Unclear etiology, had hemoccult positive stool but no report of melena or hematochezia to explain Hgb of 4 on admission  -Received 2u pRBC on 1/30  -Iron studies are not  consistent with iron deficiency anemia -INR normal, LDH normal, fibrinogen elevated, Haptoglobin 308. Not consistent with hemolytic anemia or DIC  -Vit B 12:  2,254. Folate: 1,996 -Continue to trend CBC. No reports of hematochezia/melena this admission. Hgb has remained stable.  -She will require GI evaluation at some point once her acute illness resolves. Will discuss with GI, for potential work up during this admission (especially with needs to resume anticoagulation)    AKI  -Baseline Cr 0.5-0.7, Cr was 1.75 on admission -Resolved with IVF  -will monitor trend  History of DVT -Xarelto was held on admission due to anemia -Currently on SCDs -will follow Hgb trend and resume anticoagulation at discharge  -no signs of acute bleeding currently   DM type 2, well controlled  -continue holding oral home meds  -Ha1c 5.3 -will continue SSI and modified carb diet   Essential HTN -Holding home antihypertensives due to shock on admission, BP currently stable without meds -will monitor and resume slowly antihypertensive agents as needed   Hypokalemia -will replete as needed  -follow BMET to assess trend    DVT prophylaxis: SCDs Code Status: Full Family Communication: no family at bedside Disposition Plan: pending further stabilization, improvement in her breathing. Back to SNF at discharge.   Consultants:   PCCM  General surgery  Urology   Procedures:   None  Antimicrobials:  Anti-infectives    Start     Dose/Rate Route Frequency Ordered Stop   07/19/16 0600  vancomycin (VANCOCIN) IVPB 1000 mg/200 mL premix     1,000 mg 200 mL/hr over 60 Minutes Intravenous Every 12 hours 07/18/16 1810     07/18/16 1815  vancomycin (VANCOCIN) IVPB 1000 mg/200 mL premix     1,000 mg 200 mL/hr over 60 Minutes Intravenous STAT 07/18/16 1810 07/18/16 1940   07/13/16 1400  vancomycin (VANCOCIN) IVPB 1000 mg/200 mL premix  Status:  Discontinued     1,000 mg 200 mL/hr over 60 Minutes Intravenous Every 24 hours 07/12/16 2058 07/14/16 1109   07/13/16 1200  vancomycin (VANCOCIN) IVPB 1000 mg/200 mL premix  Status:  Discontinued     1,000 mg 200  mL/hr over 60 Minutes Intravenous Every 24 hours 07/12/16 1412 07/12/16 2051   07/13/16 0000  vancomycin (VANCOCIN) 50 mg/mL oral solution 500 mg  Status:  Discontinued     500 mg Oral Every 6 hours 07/12/16 2051 07/15/16 1328   07/12/16 2200  piperacillin-tazobactam (ZOSYN) IVPB 3.375 g  Status:  Discontinued     3.375 g 12.5 mL/hr over 240 Minutes Intravenous Every 8 hours 07/12/16 1412 07/12/16 2051   07/12/16 2200  piperacillin-tazobactam (ZOSYN) IVPB 3.375 g     3.375 g 12.5 mL/hr over 240 Minutes Intravenous Every 8 hours 07/12/16 2058     07/12/16 1315  piperacillin-tazobactam (ZOSYN) IVPB 3.375 g     3.375 g 100 mL/hr over 30 Minutes Intravenous  Once 07/12/16 1314 07/12/16 1356   07/12/16 1315  vancomycin (VANCOCIN) IVPB 1000 mg/200 mL premix     1,000 mg 200 mL/hr over 60 Minutes Intravenous  Once 07/12/16 1314 07/12/16 1512      Subjective: Patient with low grade fever and still requiring [place] supplementation. Breathing is not labored, but still with positive rhonchi and wheezes on exam.  Objective: Vitals:   07/20/16 0500 07/20/16 0502 07/20/16 1529 07/20/16 1933  BP:  136/70 128/65   Pulse:  86 89   Resp:  20 18   Temp:  99.4 F (37.4 C) 100 F (  37.8 C)   TempSrc:  Oral Oral   SpO2:  99% 99% 98%  Weight: 69.9 kg (154 lb 1.6 oz)     Height:        Intake/Output Summary (Last 24 hours) at 07/20/16 2210 Last data filed at 07/20/16 1944  Gross per 24 hour  Intake              365 ml  Output             2900 ml  Net            -2535 ml   Filed Weights   07/18/16 0512 07/19/16 0500 07/20/16 0500  Weight: 71.8 kg (158 lb 4.6 oz) 69.6 kg (153 lb 7 oz) 69.9 kg (154 lb 1.6 oz)   Examination:  General exam: Appears calm and comfortable, having supper. Patient with low grade temp this morning. No CP, reports improvement in her breathing, but still requiring [place] supplementation.  Respiratory system: fair air movement, positive rhonchi L > R; positive exp wheezing.  Respiratory effort normal. Cardiovascular system: S1 & S2 heard, RRR. No JVD, murmurs, rubs, gallops or clicks. No pedal edema. Gastrointestinal system: Abdomen is nondistended, soft,denies tenderness on palpation. No organomegaly or masses felt. Normal bowel sounds heard. Gu: Bilateral nephrostomy tubes present, foley in place  Central nervous system: Alert and oriented. CN intact, following commands properly. Patient with chronic motor deficits from MS. Extremities: no swelling, no cyanosis  Skin: No acute rashes or bruises, patient with +chronic bilateral feet with venous stasis lesions/skin changes  Psychiatry: Judgement and insight appear normal. Mood & affect appropriate.   Data Reviewed: I have personally reviewed following labs and imaging studies  CBC:  Recent Labs Lab 07/16/16 0749 07/17/16 0342 07/18/16 0349 07/19/16 0426 07/20/16 0414  WBC 16.7* 14.2* 15.3* 12.5* 10.6*  NEUTROABS 13.1* 9.2* 10.6* 9.0* 6.9  HGB 7.9* 7.8* 8.2* 8.2* 7.9*  HCT 23.0* 23.7* 25.4* 24.8* 25.2*  MCV 87.8 87.5 87.6 88.3 87.8  PLT 310 369 441* 443* [id number]*   Basic Metabolic Panel:  Recent Labs Lab 07/15/16 0500 07/16/16 0749 07/17/16 0342 07/18/16 0349 07/19/16 0426 07/20/16 0414  NA 140 143 144 144 143 143  K 3.9 3.0* 3.3* 3.2* 3.2* 3.7  CL 113* 115* 119* 117* 116* 115*  CO2 21* 21* 22 22 24 24   GLUCOSE 92 109* 203* 106* 88 125*  BUN 14 12 7  <5* <5* 5*  CREATININE 0.88 1.01* 0.82 0.59 0.62 0.86  CALCIUM 9.3 9.3 9.0 9.1 8.8* 8.7*  MG 1.7  --   --   --   --   --   PHOS 2.6  --   --   --   --   --    GFR: Estimated Creatinine Clearance: 70.9 mL/min (by C-G formula based on SCr of 0.86 mg/dL).   Liver Function Tests:  Recent Labs Lab 07/16/16 0749  AST 30  ALT 47  ALKPHOS 136*  BILITOT 0.6  PROT 6.0*  ALBUMIN 1.9*   Coagulation Profile:  Recent Labs Lab 07/15/16 0845  INR 1.16   CBG:  Recent Labs Lab 07/19/16 2021 07/20/16 0856 07/20/16 1217 07/20/16 1712  07/20/16 2134  GLUCAP 145* 137* 148* 116* 155*   Sepsis Labs: No results for input(s): PROCALCITON, LATICACIDVEN in the last 168 hours.  Recent Results (from the past 240 hour(s))  Culture, blood (Routine x 2)     Status: Abnormal   Collection Time: 07/12/16  1:13 PM  Result Value  Ref Range Status   Specimen Description BLOOD LEFT ANTECUBITAL  Final   Special Requests BOTTLES DRAWN AEROBIC AND ANAEROBIC 5CC  Final   Culture  Setup Time   Final    GRAM POSITIVE COCCI IN CLUSTERS ANAEROBIC BOTTLE ONLY CRITICAL RESULT CALLED TO, READ BACK BY AND VERIFIED WITH: C. Shade Pharm.D. 15:15 07/13/16 ([email or web address])    Culture (A)  Final    STAPHYLOCOCCUS SPECIES (COAGULASE NEGATIVE) THE SIGNIFICANCE OF ISOLATING THIS ORGANISM FROM A SINGLE SET OF BLOOD CULTURES WHEN MULTIPLE SETS ARE DRAWN IS UNCERTAIN. PLEASE NOTIFY THE MICROBIOLOGY DEPARTMENT WITHIN ONE WEEK IF SPECIATION AND SENSITIVITIES ARE REQUIRED. Performed at [facility], [place] [address], [place], [place] [address]    Report Status 07/16/2016 FINAL  Final  Culture, blood (Routine x 2)     Status: None   Collection Time: 07/12/16  1:13 PM  Result Value Ref Range Status   Specimen Description BLOOD LEFT ANTECUBITAL  Final   Special Requests BOTTLES DRAWN AEROBIC AND ANAEROBIC 5CC  Final   Culture   Final    NO GROWTH 5 DAYS Performed at [facility], [place] [address], [place], [place] [address]    Report Status 07/17/2016 FINAL  Final  Blood Culture ID Panel (Reflexed)     Status: Abnormal   Collection Time: 07/12/16  1:13 PM  Result Value Ref Range Status   Enterococcus species NOT DETECTED NOT DETECTED Final   Listeria monocytogenes NOT DETECTED NOT DETECTED Final   Staphylococcus species DETECTED (A) NOT DETECTED Final    Comment: Methicillin (oxacillin) susceptible coagulase negative staphylococcus. Possible blood culture contaminant (unless isolated from more than one blood culture draw or clinical case suggests  pathogenicity). No antibiotic treatment is indicated for blood  culture contaminants. CRITICAL RESULT CALLED TO, READ BACK BY AND VERIFIED WITH: C. Shade Pharm.D. 15:15 07/13/16 ([email or web address])    Staphylococcus aureus NOT DETECTED NOT DETECTED Final   Methicillin resistance NOT DETECTED NOT DETECTED Final   Streptococcus species NOT DETECTED NOT DETECTED Final   Streptococcus agalactiae NOT DETECTED NOT DETECTED Final   Streptococcus pneumoniae NOT DETECTED NOT DETECTED Final   Streptococcus pyogenes NOT DETECTED NOT DETECTED Final   Acinetobacter baumannii NOT DETECTED NOT DETECTED Final   Enterobacteriaceae species NOT DETECTED NOT DETECTED Final   Enterobacter cloacae complex NOT DETECTED NOT DETECTED Final   Escherichia coli NOT DETECTED NOT DETECTED Final   Klebsiella oxytoca NOT DETECTED NOT DETECTED Final   Klebsiella pneumoniae NOT DETECTED NOT DETECTED Final   Proteus species NOT DETECTED NOT DETECTED Final   Serratia marcescens NOT DETECTED NOT DETECTED Final   Haemophilus influenzae NOT DETECTED NOT DETECTED Final   Neisseria meningitidis NOT DETECTED NOT DETECTED Final   Pseudomonas aeruginosa NOT DETECTED NOT DETECTED Final   Candida albicans NOT DETECTED NOT DETECTED Final   Candida glabrata NOT DETECTED NOT DETECTED Final   Candida krusei NOT DETECTED NOT DETECTED Final   Candida parapsilosis NOT DETECTED NOT DETECTED Final   Candida tropicalis NOT DETECTED NOT DETECTED Final    Comment: Performed at [facility], [place] [address], [place],  [address]  Urine culture     Status: Abnormal   Collection Time: 07/12/16  5:33 PM  Result Value Ref Range Status   Specimen Description OTHER  Final   Special Requests NONE  Final   Culture MULTIPLE SPECIES PRESENT, SUGGEST RECOLLECTION (A)  Final   Report Status 07/14/2016 FINAL  Final  MRSA PCR Screening  Status: None   Collection Time: 07/12/16  8:53 PM  Result Value Ref Range Status   MRSA by PCR NEGATIVE NEGATIVE  Final    Comment:        The GeneXpert MRSA Assay (FDA approved for NASAL specimens only), is one component of a comprehensive MRSA colonization surveillance program. It is not intended to diagnose MRSA infection nor to guide or monitor treatment for MRSA infections.   Culture, blood (routine x 2)     Status: None (Preliminary result)   Collection Time: 07/19/16  8:02 AM  Result Value Ref Range Status   Specimen Description BLOOD RIGHT HAND  Final   Special Requests BOTTLES DRAWN AEROBIC ONLY 5CC  Final   Culture   Final    NO GROWTH 1 DAY Performed at [facility], [address] [place], [place] [address]    Report Status PENDING  Incomplete  Culture, blood (routine x 2)     Status: None (Preliminary result)   Collection Time: 07/19/16  8:04 AM  Result Value Ref Range Status   Specimen Description BLOOD LEFT HAND  Final   Special Requests IN PEDIATRIC BOTTLE 3CC  Final   Culture   Final    NO GROWTH 1 DAY Performed at [facility], [place] [address] [place], [place] [address]    Report Status PENDING  Incomplete     Radiology Studies: No results found.   Scheduled Meds: . atorvastatin  10 mg Oral QHS  . budesonide (PULMICORT) nebulizer solution  0.25 mg Nebulization BID  . famotidine  20 mg Oral BID  . hydrocerin   Topical BID  . insulin aspart  0-9 Units Subcutaneous TID WC  . ipratropium-albuterol  3 mL Nebulization BID  . piperacillin-tazobactam (ZOSYN)  IV  3.375 g Intravenous Q8H  . vancomycin  1,000 mg Intravenous Q12H   Continuous Infusions:    LOS: 8 days    Time spent: 25 minutes   Barton Dubois, MD [phone] [facility] [email or web address] Password [email or web address] 07/20/2016, 10:10 PM

## 2016-07-21 ENCOUNTER — Other Ambulatory Visit (HOSPITAL_COMMUNITY): Payer: Medicare Other

## 2016-07-21 LAB — GLUCOSE, CAPILLARY
GLUCOSE-CAPILLARY: 201 mg/dL — AB (ref 65–99)
GLUCOSE-CAPILLARY: 141 mg/dL — AB (ref 65–99)
Glucose-Capillary: 122 mg/dL — ABNORMAL HIGH (ref 65–99)
Glucose-Capillary: 116 mg/dL — ABNORMAL HIGH (ref 65–99)

## 2016-07-21 MED ORDER — ENSURE ENLIVE PO LIQD
237.0000 mL | Freq: Two times a day (BID) | ORAL | Status: DC
  Administered 2016-07-22 – 2016-07-26 (×6): 237 mL via ORAL

## 2016-07-21 NOTE — Progress Notes (Signed)
Initial Nutrition Assessment  DOCUMENTATION CODES:   Not applicable  INTERVENTION:   Ensure Enlive po BID, each supplement provides 350 kcal and 20 grams of protein  NUTRITION DIAGNOSIS:   Increased nutrient needs related to wound healing, acute illness as evidenced by increased estimated needs from protein, severe depletion of muscle mass in legs and clavicles.  GOAL:   Patient will meet greater than or equal to 90% of their needs  MONITOR:   PO intake, Supplement acceptance, Skin, Weight trends, Labs  REASON FOR ASSESSMENT:   LOS    ASSESSMENT:   56 yo female with past medical history of multiple sclerosis, history of DVT on Xarelto, hx of bilateral obstructing nephrolithiasis s/p bilateral perc nephrostomy tube in July 2017 and December 2017, who presented to the emergency department due to fevers.  Admitted for septic shock, CT renal stone on 1/30 with questionable cystitis, questionable diverticulitis or colitis. Urology consulted: likely pelvic abscess vs urinoma.   Met with pt in room today. Pt reports good appetite pta and currently. Pt is currently eating 50-100% meals. Pt resides in SNF and reports that she does drink Ensure sometimes when they give it to her. RD gave pt an Ensure while in room and pt drank 100%. Pt's chart weights are difficult to interpret. Pt reports that a few months ago she weighed 113lbs after losing about 50lbs and there is three documented wts in the chart of patient weighing 96lbs; however, this would mean that pt had lost pretty significant amounts of weight in short periods of time. RD is unsure if any true weight loss but pt seems to be weight stable since December. Pt does have wounds on bilateral feet. Pt with hypokalemia on admit, resolved now.   Medications reviewed and include: pepcid, insulin, zosyn, vancomycin, hydrocodone   Labs reviewed: Cl 155(H), BUN 5(L), Ca 8.7(L) Wbc- 10.6(H), Hgb 7.9(L), Hct 25.2(L)  Nutrition-Focused  physical exam completed. Findings are no fat depletion, severe muscle depletion in legs and clavicles, and no edema.   Diet Order:  Diet regular Room service appropriate? Yes; Fluid consistency: Thin  Skin:  Wound (see comment) (wound bilateral heels)  Last BM:  2/7  Height:   Ht Readings from Last 1 Encounters:  07/12/16 '5\' 5"'  (1.651 m)    Weight:   Wt Readings from Last 1 Encounters:  07/21/16 152 lb 8.9 oz (69.2 kg)    Ideal Body Weight:  56.8 kg  BMI:  Body mass index is 25.39 kg/m.  Estimated Nutritional Needs:   Kcal:  1400-1700kcal/day   Protein:  84-98g/day   Fluid:  >1.4L/day   EDUCATION NEEDS:   No education needs identified at this time  Koleen Distance, RD, LDN Pager #[phone]

## 2016-07-21 NOTE — Care Management Note (Signed)
Case Management Note  Patient Details  Name: [name] MRN: [id number] Date of Birth: 11/22/1960  Subjective/Objective:       Urinary and resp. infections             Action/Plan:Date:  July 21, 2016 Chart reviewed for concurrent status and case management needs. Will continue to follow patient progress. Discharge Planning: following for needs Expected discharge date: [id number] Rhonda Davis, BSN, [place], [place]   Expected Discharge Date:   (unknown)               Expected Discharge Plan:  [place]  In-House Referral:  Clinical Social Work  Discharge planning Services  CM Consult  Post Acute Care Choice:    Choice offered to:     DME Arranged:    DME Agency:     HH Arranged:    [place] Agency:     Status of Service:  Completed, signed off  If discussed at [employer], dates discussed:    Additional Comments:  Leeroy Cha, RN 07/21/2016, 12:28 PM

## 2016-07-21 NOTE — Consult Note (Signed)
Referring Provider:  Reino Bellis ([facility]) Primary Care Physician:  Blanchie Serve, MD Primary Gastroenterologist:  None (Unassigned)--has been scoped in hospital by Dr. Paulita Fujita  Reason for Consultation:  Heme positive anemia   HPI: [name] is a 56 y.o. female with multiple sclerosis, bilateral nephrostomies, admitted with sepsis about a week ago, then developed hospital-acquired pneumonia which is under treatment and improving.  At time of admission, her hemoglobin was 4.8 with a normal MCV; it has subsequently been holding steady around 8. Stool was Hemoccult positive at time of admission. Iron studies shortly after admission showed a low iron saturation of 7% and an elevated ferritin level which might be spurious as an acute phase reactant.  The patient is maintained on Xarelto is chronically because of a prior history of DVT. She is not on any ulcerogenic medications.  She underwent upper endoscopy by Dr. Paulita Fujita in July 2016 because of anemia on Xarelto, and that exam was normal.  The patient does not have any localizing GI tract symptoms, and has not noticed any visible evidence of GI tract blood loss such as melena, burgundy stools, or frank hematochezia. She does not have dyspeptic symptoms, anorexia, involuntary weight loss, constipation, or diarrhea.  The patient stays at [facility], an extended care facility. She gets around by electric wheelchair. She does not use the bathroom but instead uses diapers.   Past Medical History:  Diagnosis Date  . Acute renal failure superimposed on stage 3 chronic kidney disease ([place]) 07/21/2015  . Anemia    chronic   . Diabetes mellitus without complication ([place])   . Diabetic neuropathy ([place])   . DVT (deep venous thrombosis) ([place])   . DVT (deep venous thrombosis) ([place])   . GERD (gastroesophageal reflux disease)   . Hx of sepsis   . Hypertension   . Left nephrolithiasis   . Leukocytosis   . Lymph edema    chronic   .  Malnutrition of moderate degree 07/21/2015  . Multiple sclerosis diagnosed 2002-not on Therapy any longer 06/26/2012  . Muscle weakness (generalized)   . Neuromuscular disorder ([place])   . Neuromuscular dysfunction of bladder   . Polyneuropathy ([place])   . Presence of indwelling urinary catheter   . Protein calorie malnutrition ([place])   . Stage 4 decubitus ulcer ([place]) 07/05/2015  . Stroke ([place])   . UTI (lower urinary tract infection)     Past Surgical History:  Procedure Laterality Date  . ESOPHAGOGASTRODUODENOSCOPY Left 01/02/2015   Procedure: ESOPHAGOGASTRODUODENOSCOPY (EGD);  Surgeon: Arta Silence, MD;  Location: [facility];  Service: Endoscopy;  Laterality: Left;  . HERNIA MESH REMOVAL    . IR GENERIC HISTORICAL  02/23/2016   IR NEPHROSTOMY EXCHANGE LEFT 02/23/2016 Sandi Mariscal, MD [facility]  . IR GENERIC HISTORICAL  04/19/2016   IR NEPHROSTOMY EXCHANGE LEFT 04/19/2016 Arne Cleveland, MD [facility]  . IR GENERIC HISTORICAL  05/13/2016   IR NEPHROSTOMY EXCHANGE LEFT 05/13/2016 Corrie Mckusick, DO [facility]  . IR GENERIC HISTORICAL  05/13/2016   IR NEPHROSTOMY PLACEMENT RIGHT 05/13/2016 Corrie Mckusick, DO [facility]  . IR GENERIC HISTORICAL  07/07/2016   IR NEPHROSTOMY EXCHANGE RIGHT 07/07/2016 Jacqulynn Cadet, MD [facility]  . IR GENERIC HISTORICAL  07/07/2016   IR NEPHROSTOMY EXCHANGE LEFT 07/07/2016 Jacqulynn Cadet, MD [facility]  . NEPHROSTOMY TUBE PLACEMENT ([place] HX)    . TRANSURETHRAL RESECTION OF BLADDER TUMOR N/A 03/16/2016   Procedure: CYSTOSCOPY BLADDER BIOPSY;  Surgeon: Franchot Gallo, MD;  Location: [facility];  Service: Urology;  Laterality: N/A;  . TUBAL LIGATION    . tubes tided      Prior to Admission medications   Medication Sig Start Date End Date Taking? Authorizing Provider  acetaminophen (TYLENOL) 325 MG tablet Take 2 tablets (650 mg total) by mouth every 6 (six) hours as needed for fever. 03/20/16  Yes Bonnielee Haff, MD  albuterol (PROVENTIL) (2.5 MG/3ML)  0.083% nebulizer solution Take 3 mLs (2.5 mg total) by nebulization every 6 (six) hours as needed for wheezing or shortness of breath. 12/30/15  Yes Robbie Lis, MD  atorvastatin (LIPITOR) 10 MG tablet Take 10 mg by mouth at bedtime.    Yes Historical Provider, MD  famotidine (PEPCID) 20 MG tablet Take 1 tablet (20 mg total) by mouth 2 (two) times daily. 11/18/15  Yes Cherene Altes, MD  furosemide (LASIX) 40 MG tablet Take 40 mg by mouth 2 (two) times daily.   Yes Historical Provider, MD  gabapentin (NEURONTIN) 100 MG capsule Take 1 capsule (100 mg total) by mouth at bedtime. 11/18/15  Yes Cherene Altes, MD  glipiZIDE (GLUCOTROL) 5 MG tablet Take 5 mg by mouth daily before breakfast.   Yes Historical Provider, MD  lisinopril (PRINIVIL,ZESTRIL) 2.5 MG tablet Take 2.5 mg by mouth daily.  11/30/15  Yes Historical Provider, MD  metFORMIN (GLUCOPHAGE) 500 MG tablet Take 0.5 tablets (250 mg total) by mouth 2 (two) times daily. 04/22/16  Yes Dinah C Ngetich, NP  Multiple Vitamin (MULTIVITAMIN WITH MINERALS) TABS tablet Take 1 tablet by mouth daily. 07/09/15  Yes Florencia Reasons, MD  nystatin cream (MYCOSTATIN) Apply 1 application topically 2 (two) times daily.   Yes Historical Provider, MD  omega-3 acid ethyl esters (LOVAZA) 1 g capsule Take 1 g by mouth daily.   Yes Historical Provider, MD  polyethylene glycol (MIRALAX / GLYCOLAX) packet Take 17 g by mouth 2 (two) times daily. 11/18/15  Yes Cherene Altes, MD  potassium chloride (K-DUR,KLOR-CON) 10 MEQ tablet Take 1 tablet (10 mEq total) by mouth daily. 03/21/16  Yes Bonnielee Haff, MD    Current Facility-Administered Medications  Medication Dose Route Frequency Provider Last Rate Last Dose  . acetaminophen (TYLENOL) tablet 650 mg  650 mg Oral Q4H PRN Shon Millet, DO   650 mg at 07/19/16 1541  . albuterol (PROVENTIL) (2.5 MG/3ML) 0.083% nebulizer solution 2.5 mg  2.5 mg Nebulization Q2H PRN Juanito Doom, MD   2.5 mg at 07/20/16 1042  .  atorvastatin (LIPITOR) tablet 10 mg  10 mg Oral QHS Chesley Mires, MD   10 mg at 07/20/16 2237  . budesonide (PULMICORT) nebulizer solution 0.25 mg  0.25 mg Nebulization BID Barton Dubois, MD   0.25 mg at 07/21/16 0915  . famotidine (PEPCID) tablet 20 mg  20 mg Oral BID Chesley Mires, MD   20 mg at 07/21/16 0846  . feeding supplement (ENSURE ENLIVE) (ENSURE ENLIVE) liquid 237 mL  237 mL Oral BID BM Barton Dubois, MD      . hydrocerin (EUCERIN) cream   Topical BID Juanito Doom, MD      . HYDROcodone-homatropine [facility]) 5-1.5 MG/5ML syrup 5 mL  5 mL Oral Q6H PRN Jani Gravel, MD   5 mL at 07/21/16 0214  . insulin aspart (novoLOG) injection 0-9 Units  0-9 Units Subcutaneous TID WC Shon Millet, DO   3 Units at 07/21/16 1244  . ipratropium-albuterol (DUONEB) 0.5-2.5 (3) MG/3ML nebulizer solution 3 mL  3 mL Nebulization BID Anderson Malta  Chahn-Yang Choi, DO   3 mL at 07/21/16 0907  . ondansetron (ZOFRAN) injection 4 mg  4 mg Intravenous Q6H PRN Juanito Doom, MD      . piperacillin-tazobactam (ZOSYN) IVPB 3.375 g  3.375 g Intravenous Q8H Baltazar Najjar [place]ill[place], RPH   3.375 g at 07/21/16 1428  . sodium chloride flush (NS) 0.9 % injection 10-40 mL  10-40 mL Intracatheter PRN Rich Fuchs Choi, DO   20 mL at 07/21/16 1331  . vancomycin (VANCOCIN) IVPB 1000 mg/200 mL premix  1,000 mg Intravenous Q12H Shon Millet, DO   1,000 mg at 07/21/16 0559    Allergies as of 07/12/2016  . (No Known Allergies)    Family History  Problem Relation Age of Onset  . Diabetes Mother   . Alzheimer's disease Mother   . Diabetes Father   . Diabetes Sister   . Scoliosis Sister     Social History   Social History  . Marital status: Single    Spouse name: N/A  . Number of children: N/A  . Years of education: N/A   Occupational History  . Not on file.   Social History Main Topics  . Smoking status: Former Smoker    Packs/day: 1.00    Years: 20.00    Types: Cigarettes    Quit date:  11/19/2014  . Smokeless tobacco: Never Used  . Alcohol use 3.0 oz/week    5 Glasses of wine per week  . Drug use: No  . Sexual activity: Not Currently   Other Topics Concern  . 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             Review of Systems:  No GI tract symptoms. No known chronic cardiopulmonary disease or manifestations such as chest pain or shortness of breath. She has chronic skin changes on her lower extremities related to her MS, perhaps chronic lymphedema.  Physical Exam: Vital signs in last 24 hours: Temp:  [99 F (37.2 C)-99.2 F (37.3 C)] 99 F (37.2 C) (02/08 1407) Pulse Rate:  [74-89] 89 (02/08 1407) Resp:  [18-20] 18 (02/08 1407) BP: (121-132)/(63-67) 128/66 (02/08 1407) SpO2:  [98 %-100 %] 100 % (02/08 1407) Weight:  [69.2 kg (152 lb 8.9 oz)] 69.2 kg (152 lb 8.9 oz) (02/08 0955) Last BM Date: 06/19/16 General:   Alert,  NAD, sitting up in bed. Head:  Normocephalic and atraumatic. Eyes:  Sclera clear, no icterus.    Mouth:   No ulcerations or lesions.  Oropharynx pink & moist. Neck:   No masses or thyromegaly. Lungs:  Clear throughout to auscultation.   No wheezes, crackles, or rhonchi. No evident respiratory distress, on [place] O2. Heart:   Regular rate and rhythm; no murmurs, clicks, rubs,  or gallops. Abdomen:  Soft, nontender, nontympanitic, and nondistended. No masses, hepatosplenomegaly or ventral hernias noted.  Msk:   Right arm appears somewhat contractured. Extremities: No frank lower extremity edema. Neurologic: Moderately dysarthric, arm contractured. Detailed testing not performed Skin: Marked hyperpigmented keratosis of distal anterior aspect of lower extremities  Cervical Nodes:  No significant cervical adenopathy. Psych:   Alert and cooperative. Normal mood and affect.  Intake/Output from previous day: 02/07 0701 - 02/08  0700 In: 365 [P.O.:275; I.V.:40; IV Piggyback:50] Out: 2550 [Urine:2550] Intake/Output this shift: Total I/O In: 500 [P.O.:480; I.V.:20] Out: 300 [Urine:300]  Lab Results:  Recent Labs  07/19/16 0426 07/20/16 0414  WBC 12.5* 10.6*  HGB 8.2* 7.9*  HCT 24.8* 25.2*  PLT 443* 447*   BMET  Recent Labs  07/19/16 0426 07/20/16 0414  NA 143 143  K 3.2* 3.7  CL 116* 115*  CO2 24 24  GLUCOSE 88 125*  BUN <5* 5*  CREATININE 0.62 0.86  CALCIUM 8.8* 8.7*   LFT No results for input(s): PROT, ALBUMIN, AST, ALT, ALKPHOS, BILITOT, BILIDIR, IBILI in the last 72 hours. PT/INR No results for input(s): LABPROT, INR in the last 72 hours.  Studies/Results: No results found.  Impression: 1. Heme positive stool on admission 2. Profound anemia, subacute, with a 4 - 5 g drop in hemoglobin in the last 2 months 3. Sepsis on admission, possibly of urinary origin; subsequent development of hospital acquired pneumonia 4. Advanced multiple sclerosis, urinary retention with nephrostomies, limited mobility  Plan:  I definitely feel this patient, who has never had colonoscopy, should have endoscopy and colonoscopy prior to discharge. We will reassess her in a couple of days, with the intent of probably prepping the patient over the weekend and doing endoscopy and colonoscopy under monitored anesthesia care on Monday. I have discussed the nature, purpose, and risks of endoscopy and colonoscopy with the patient and she is agreeable to proceed    LOS: 9 days   Jeanae Whitmill V  07/21/2016, 4:57 PM   Pager [phone] If no answer or after 5 PM call [phone]

## 2016-07-21 NOTE — Progress Notes (Signed)
Pharmacy Antibiotic Note  [name] is a 56 y.o. female admitted on 07/12/2016 with septic shock. Patient currently on day #10 of Zosyn. On 2/5, patient with fever of 102.4, and CXR showing patchy left mid-lower lung infiltrate. Therefore, MD added coverage for PNA with Vancomycin, and pharmacy consulted to assist with dosing. Today is day #4 of Vancoomycin. Currently temperature curve improving and WBC trending down.   Plan:  Continue present doses of Vancomycin and Zosyn for now.  Per d/w MD, plan to transition to oral abx soon as long as she continues to improve, so will hold off on Vancomycin trough level at this time. If remains on Vancomycin tomorrow, will plan to check a trough level at that time.   Follow renal function (BMET in AM), cultures, clinical course.    Height: 5\' 5"  (165.1 cm) Weight: 152 lb 8.9 oz (69.2 kg) IBW/kg (Calculated) : 57  Temp (24hrs), Avg:99.1 F (37.3 C), Min:99 F (37.2 C), Max:99.2 F (37.3 C)   Recent Labs Lab 07/16/16 0749 07/17/16 0342 07/18/16 0349 07/19/16 0426 07/20/16 0414  WBC 16.7* 14.2* 15.3* 12.5* 10.6*  CREATININE 1.01* 0.82 0.59 0.62 0.86    Estimated Creatinine Clearance: 70.5 mL/min (by C-G formula based on SCr of 0.86 mg/dL).    No Known Allergies   Antimicrobials this admission: 1/30 Vanc >> 2/1, restarted 2/5 >> 1/30 Zosyn >> 1/30 PO Vanc >> 2/2  Microbiology results: 1/30 BCx: 1/2 CoNS, likely contaminant 1/30 UCx: multiple species, suggest recollection 1/30 influenza PCR: neg 1/30 MRSA PCR: neg Cdiff: unable to be collected 2/6 BCx: NGTD  Thank you for allowing pharmacy to be a part of this patient's care.   Lindell Spar, PharmD, BCPS Pager: [phone] 07/21/2016 4:42 PM

## 2016-07-21 NOTE — Progress Notes (Signed)
PROGRESS NOTE    [name]  [id number] DOB: 07-Jan-1961 DOA: 07/12/2016 PCP: Blanchie Serve, MD     Brief Narrative:  [name] is a 56 yo female with past medical history of multiple sclerosis, history of DVT on Xarelto, hx of bilateral obstructing nephrolithiasis s/p bilateral perc nephrostomy tube in July 2017 and December 2017, who presented to the emergency department due to fevers. She was encephalopathic at time of admission. In the emergency department, she had a fever of 103, hypotensive, lactic acidosis, and hemoglobin of 4. She was initially admitted to [facility] services, with presumed sepsis secondary to UTI. She was treated with IV antibiotics, given blood products. She did require pressors for blood pressure support. She was transferred to [facility] with stabilization of BP on 07/15/16.  CT abdomen/pelvis was obtained as patient continued with leukocytosis. This showed multiloculated pelvic fluid collection. General surgery as well as urology were consulted. General surgery did not feel this was related to her bowel and has since signed off. Urology feels this is likely pelvic abscess vs urinoma. They recommend nephrostomy tubes bilaterally (these were in place prior to this admission) as well as foley in order to allow drainage of urine away from the bladder to allow healing. She has been on zosyn.   On evening of 2/5, she started to spike fevers with respiratory complaints. CXR obtained showed LLL infiltrate. Continue IV abx's. GI consulted given positive FOBT, low Hgb on admission and need for anticoagulation.   Assessment & Plan:   Principal Problem:   Septic shock ([name]) Active Problems:   Multiple sclerosis diagnosed 2002-not on Therapy any longer   DVT, lower extremity (HCC)   Chronic indwelling Foley catheter   AKI (acute kidney injury) ([place])   Controlled diabetes mellitus type 2 with complications ([place])   Pelvic abscess in female   Acute metabolic encephalopathy  Anemia of chronic disease   Essential hypertension  Shock, multifactorial -Septic as well as hemorrhagic -Required levophed, her pressure is now stable off pressors -Initially thought source to be secondary to UTI. She was started on IV Vanco and Zosyn, but urine culture has now resulted with multiple organisms, UA does show large leuk, numerous WBC, many bacteria. Previous urine culture has shown pan-sensitive E Coli and Pseudomonas, resistant providencia  -CT renal stone on 1/30 with questionable cystitis, questionable diverticulitis or colitis  -Due to diarrhea, C. difficile PCR was ordered as well as oral vancomycin. However, before sample could be obtained, she stopped having diarrhea. Therefore PCR test, enteric precaution, oral vanco are stopped.  -Blood culture with 1 of 2 coag neg staph, likely contaminant  -Influenza negative  -CT abd/pelvis obtained due to continued leukocytosis, this showed multiloculated pelvic fluid collection  -Urology consulted: likely pelvic abscess vs urinoma. Continue nephrostomy tubes and foley to allow drainage of urine away from bladder for healing  -Continue zosyn for now -Repeat blood culture pending  -also treating HCAP see below -Hgb and VS remains stable.  LLL HCAP -Respiratory complaints started 2/5.  -Re-started vanco along with zosyn; which will be continued. Watch leukocytosis and fever curve  -will weaned O2 supplementation as tolerated -continue PRN duoneb -will continue pulmicort and flutter valve   Acute metabolic encephalopathy -Now resolved and at baseline -most likely associated with fever and acute infection.   Acute on chronic normocytic anemia -Baseline Hgb 8.2-9.5 -Unclear etiology, had hemoccult positive stool but no report of melena or hematochezia to explain Hgb of 4 on admission  -Received 2u  pRBC on 1/30  -Iron studies are not consistent with iron deficiency anemia -INR normal, LDH normal, fibrinogen elevated,  Haptoglobin 308. Not consistent with hemolytic anemia or DIC  -Vit B 12: 2,254. Folate: 1,996 -Continue to trend CBC. No reports of hematochezia/melena this admission. Hgb has remained stable.  -Discuss with GI, for potential work up during this admission (especially with needs to resume anticoagulation). Might need EGD and/or colonoscopy, will follow GI rec's   AKI  -Baseline Cr 0.5-0.7, Cr was 1.75 on admission -Resolved with IVF  -will monitor trend  History of DVT -Xarelto was held on admission due to anemia -Currently on SCDs -will follow Hgb trend and resume anticoagulation at discharge (if safe)  -no signs of acute bleeding currently   DM type 2, well controlled  -continue holding oral home meds  -Ha1c 5.3 -will continue SSI and modified carb diet   Essential HTN -Holding home antihypertensives due to shock on admission, BP currently stable without meds -will monitor and resume slowly antihypertensive agents as needed   Hypokalemia -will replete as needed  -follow BMET to assess trend    DVT prophylaxis: SCDs Code Status: Full Family Communication: no family at bedside Disposition Plan: pending further stabilization, improvement in her breathing. Back to SNF at discharge. Will get GI inputs regarding need for EGD/colonoscopy.   Consultants:   PCCM  General surgery  Urology   GI  Procedures:   None  Antimicrobials:  Anti-infectives    Start     Dose/Rate Route Frequency Ordered Stop   07/19/16 0600  vancomycin (VANCOCIN) IVPB 1000 mg/200 mL premix     1,000 mg 200 mL/hr over 60 Minutes Intravenous Every 12 hours 07/18/16 1810     07/18/16 1815  vancomycin (VANCOCIN) IVPB 1000 mg/200 mL premix     1,000 mg 200 mL/hr over 60 Minutes Intravenous STAT 07/18/16 1810 07/18/16 1940   07/13/16 1400  vancomycin (VANCOCIN) IVPB 1000 mg/200 mL premix  Status:  Discontinued     1,000 mg 200 mL/hr over 60 Minutes Intravenous Every 24 hours 07/12/16 2058 07/14/16  1109   07/13/16 1200  vancomycin (VANCOCIN) IVPB 1000 mg/200 mL premix  Status:  Discontinued     1,000 mg 200 mL/hr over 60 Minutes Intravenous Every 24 hours 07/12/16 1412 07/12/16 2051   07/13/16 0000  vancomycin (VANCOCIN) 50 mg/mL oral solution 500 mg  Status:  Discontinued     500 mg Oral Every 6 hours 07/12/16 2051 07/15/16 1328   07/12/16 2200  piperacillin-tazobactam (ZOSYN) IVPB 3.375 g  Status:  Discontinued     3.375 g 12.5 mL/hr over 240 Minutes Intravenous Every 8 hours 07/12/16 1412 07/12/16 2051   07/12/16 2200  piperacillin-tazobactam (ZOSYN) IVPB 3.375 g     3.375 g 12.5 mL/hr over 240 Minutes Intravenous Every 8 hours 07/12/16 2058     07/12/16 1315  piperacillin-tazobactam (ZOSYN) IVPB 3.375 g     3.375 g 100 mL/hr over 30 Minutes Intravenous  Once 07/12/16 1314 07/12/16 1356   07/12/16 1315  vancomycin (VANCOCIN) IVPB 1000 mg/200 mL premix     1,000 mg 200 mL/hr over 60 Minutes Intravenous  Once 07/12/16 1314 07/12/16 1512      Subjective: Patient now afebrile for 24 hours. Still requiring 2L [place] supplementation. Breathing is not labored, still with some rhonchi and wheezes on exam.  Objective: Vitals:   07/21/16 0617 07/21/16 0907 07/21/16 0915 07/21/16 0955  BP: 121/63     Pulse: 74  Resp: 20     Temp: 99 F (37.2 C)     TempSrc: Oral     SpO2: 100% 99% 99%   Weight:    69.2 kg (152 lb 8.9 oz)  Height:        Intake/Output Summary (Last 24 hours) at 07/21/16 1336 Last data filed at 07/21/16 1331  Gross per 24 hour  Intake              625 ml  Output             1950 ml  Net            -1325 ml   Filed Weights   07/19/16 0500 07/20/16 0500 07/21/16 0955  Weight: 69.6 kg (153 lb 7 oz) 69.9 kg (154 lb 1.6 oz) 69.2 kg (152 lb 8.9 oz)   Examination:  General exam: Appears calm and comfortable, report breathing better. Patient now with 24 hours w/o fever. No CP, no nausea, no vomiting and no reports of melena or hematochezia. Patient still  requiring 2L [place] supplementation.  Respiratory system: fair air movement, rhonchi L > R; positive mild exp wheezing. Respiratory effort normal. Cardiovascular system: S1 & S2 heard, RRR. No JVD, murmurs, rubs, gallops or clicks. No pedal edema. Gastrointestinal system: Abdomen is nondistended, soft,denies tenderness on palpation. No organomegaly or masses felt. Normal bowel sounds heard. Gu: Bilateral nephrostomy tubes present, foley in place  Central nervous system: Alert and oriented. CN intact, following commands properly. Patient with chronic motor deficits from MS. Extremities: no swelling, no cyanosis  Skin: No acute rashes or bruises, patient with +chronic bilateral feet with venous stasis lesions/skin changes  Psychiatry: Judgement and insight appear normal. Mood & affect appropriate.   Data Reviewed: I have personally reviewed following labs and imaging studies  CBC:  Recent Labs Lab 07/16/16 0749 07/17/16 0342 07/18/16 0349 07/19/16 0426 07/20/16 0414  WBC 16.7* 14.2* 15.3* 12.5* 10.6*  NEUTROABS 13.1* 9.2* 10.6* 9.0* 6.9  HGB 7.9* 7.8* 8.2* 8.2* 7.9*  HCT 23.0* 23.7* 25.4* 24.8* 25.2*  MCV 87.8 87.5 87.6 88.3 87.8  PLT 310 369 441* 443* [id number]*   Basic Metabolic Panel:  Recent Labs Lab 07/15/16 0500 07/16/16 0749 07/17/16 0342 07/18/16 0349 07/19/16 0426 07/20/16 0414  NA 140 143 144 144 143 143  K 3.9 3.0* 3.3* 3.2* 3.2* 3.7  CL 113* 115* 119* 117* 116* 115*  CO2 21* 21* 22 22 24 24   GLUCOSE 92 109* 203* 106* 88 125*  BUN 14 12 7  <5* <5* 5*  CREATININE 0.88 1.01* 0.82 0.59 0.62 0.86  CALCIUM 9.3 9.3 9.0 9.1 8.8* 8.7*  MG 1.7  --   --   --   --   --   PHOS 2.6  --   --   --   --   --    GFR: Estimated Creatinine Clearance: 70.5 mL/min (by C-G formula based on SCr of 0.86 mg/dL).   Liver Function Tests:  Recent Labs Lab 07/16/16 0749  AST 30  ALT 47  ALKPHOS 136*  BILITOT 0.6  PROT 6.0*  ALBUMIN 1.9*   Coagulation Profile:  Recent Labs Lab  07/15/16 0845  INR 1.16   CBG:  Recent Labs Lab 07/20/16 1217 07/20/16 1712 07/20/16 2134 07/21/16 0718 07/21/16 1131  GLUCAP 148* 116* 155* 122* 201*   Sepsis Labs: No results for input(s): PROCALCITON, LATICACIDVEN in the last 168 hours.  Recent Results (from the past 240 hour(s))  Culture, blood (Routine  x 2)     Status: Abnormal   Collection Time: 07/12/16  1:13 PM  Result Value Ref Range Status   Specimen Description BLOOD LEFT ANTECUBITAL  Final   Special Requests BOTTLES DRAWN AEROBIC AND ANAEROBIC 5CC  Final   Culture  Setup Time   Final    GRAM POSITIVE COCCI IN CLUSTERS ANAEROBIC BOTTLE ONLY CRITICAL RESULT CALLED TO, READ BACK BY AND VERIFIED WITH: C. Shade Pharm.D. 15:15 07/13/16 ([email or web address])    Culture (A)  Final    STAPHYLOCOCCUS SPECIES (COAGULASE NEGATIVE) THE SIGNIFICANCE OF ISOLATING THIS ORGANISM FROM A SINGLE SET OF BLOOD CULTURES WHEN MULTIPLE SETS ARE DRAWN IS UNCERTAIN. PLEASE NOTIFY THE MICROBIOLOGY DEPARTMENT WITHIN ONE WEEK IF SPECIATION AND SENSITIVITIES ARE REQUIRED. Performed at [facility], [place] [address], [place], [place] [address]    Report Status 07/16/2016 FINAL  Final  Culture, blood (Routine x 2)     Status: None   Collection Time: 07/12/16  1:13 PM  Result Value Ref Range Status   Specimen Description BLOOD LEFT ANTECUBITAL  Final   Special Requests BOTTLES DRAWN AEROBIC AND ANAEROBIC 5CC  Final   Culture   Final    NO GROWTH 5 DAYS Performed at [facility], [place] [address], [place], [place] [address]    Report Status 07/17/2016 FINAL  Final  Blood Culture ID Panel (Reflexed)     Status: Abnormal   Collection Time: 07/12/16  1:13 PM  Result Value Ref Range Status   Enterococcus species NOT DETECTED NOT DETECTED Final   Listeria monocytogenes NOT DETECTED NOT DETECTED Final   Staphylococcus species DETECTED (A) NOT DETECTED Final    Comment: Methicillin (oxacillin) susceptible coagulase negative staphylococcus. Possible  blood culture contaminant (unless isolated from more than one blood culture draw or clinical case suggests pathogenicity). No antibiotic treatment is indicated for blood  culture contaminants. CRITICAL RESULT CALLED TO, READ BACK BY AND VERIFIED WITH: C. Shade Pharm.D. 15:15 07/13/16 ([email or web address])    Staphylococcus aureus NOT DETECTED NOT DETECTED Final   Methicillin resistance NOT DETECTED NOT DETECTED Final   Streptococcus species NOT DETECTED NOT DETECTED Final   Streptococcus agalactiae NOT DETECTED NOT DETECTED Final   Streptococcus pneumoniae NOT DETECTED NOT DETECTED Final   Streptococcus pyogenes NOT DETECTED NOT DETECTED Final   Acinetobacter baumannii NOT DETECTED NOT DETECTED Final   Enterobacteriaceae species NOT DETECTED NOT DETECTED Final   Enterobacter cloacae complex NOT DETECTED NOT DETECTED Final   Escherichia coli NOT DETECTED NOT DETECTED Final   Klebsiella oxytoca NOT DETECTED NOT DETECTED Final   Klebsiella pneumoniae NOT DETECTED NOT DETECTED Final   Proteus species NOT DETECTED NOT DETECTED Final   Serratia marcescens NOT DETECTED NOT DETECTED Final   Haemophilus influenzae NOT DETECTED NOT DETECTED Final   Neisseria meningitidis NOT DETECTED NOT DETECTED Final   Pseudomonas aeruginosa NOT DETECTED NOT DETECTED Final   Candida albicans NOT DETECTED NOT DETECTED Final   Candida glabrata NOT DETECTED NOT DETECTED Final   Candida krusei NOT DETECTED NOT DETECTED Final   Candida parapsilosis NOT DETECTED NOT DETECTED Final   Candida tropicalis NOT DETECTED NOT DETECTED Final    Comment: Performed at [facility], [place] [address], [place], [place] [address]  Urine culture     Status: Abnormal   Collection Time: 07/12/16  5:33 PM  Result Value Ref Range Status   Specimen Description OTHER  Final   Special Requests NONE  Final   Culture MULTIPLE SPECIES PRESENT, SUGGEST  RECOLLECTION (A)  Final   Report Status 07/14/2016 FINAL  Final  MRSA PCR Screening     Status:  None   Collection Time: 07/12/16  8:53 PM  Result Value Ref Range Status   MRSA by PCR NEGATIVE NEGATIVE Final    Comment:        The GeneXpert MRSA Assay (FDA approved for NASAL specimens only), is one component of a comprehensive MRSA colonization surveillance program. It is not intended to diagnose MRSA infection nor to guide or monitor treatment for MRSA infections.   Culture, blood (routine x 2)     Status: None (Preliminary result)   Collection Time: 07/19/16  8:02 AM  Result Value Ref Range Status   Specimen Description BLOOD RIGHT HAND  Final   Special Requests BOTTLES DRAWN AEROBIC ONLY 5CC  Final   Culture   Final    NO GROWTH 2 DAYS Performed at [facility], [address] [place], [place] [address]    Report Status PENDING  Incomplete  Culture, blood (routine x 2)     Status: None (Preliminary result)   Collection Time: 07/19/16  8:04 AM  Result Value Ref Range Status   Specimen Description BLOOD LEFT HAND  Final   Special Requests IN PEDIATRIC BOTTLE 3CC  Final   Culture   Final    NO GROWTH 2 DAYS Performed at [facility], [place] [address] [place], [place] [address]    Report Status PENDING  Incomplete     Radiology Studies: No results found.   Scheduled Meds: . atorvastatin  10 mg Oral QHS  . budesonide (PULMICORT) nebulizer solution  0.25 mg Nebulization BID  . famotidine  20 mg Oral BID  . feeding supplement (ENSURE ENLIVE)  237 mL Oral BID BM  . hydrocerin   Topical BID  . insulin aspart  0-9 Units Subcutaneous TID WC  . ipratropium-albuterol  3 mL Nebulization BID  . piperacillin-tazobactam (ZOSYN)  IV  3.375 g Intravenous Q8H  . vancomycin  1,000 mg Intravenous Q12H   Continuous Infusions:    LOS: 9 days    Time spent: 25 minutes   Barton Dubois, MD [phone] [facility] [email or web address] Password [email or web address] 07/21/2016, 1:36 PM

## 2016-07-22 DIAGNOSIS — R195 Other fecal abnormalities: Secondary | ICD-10-CM

## 2016-07-22 LAB — BASIC METABOLIC PANEL
Anion gap: 4 — ABNORMAL LOW (ref 5–15)
BUN: 7 mg/dL (ref 6–20)
CALCIUM: 8.8 mg/dL — AB (ref 8.9–10.3)
CO2: 26 mmol/L (ref 22–32)
CREATININE: 0.72 mg/dL (ref 0.44–1.00)
Chloride: 110 mmol/L (ref 101–111)
Glucose, Bld: 148 mg/dL — ABNORMAL HIGH (ref 65–99)
Potassium: 3.6 mmol/L (ref 3.5–5.1)
SODIUM: 140 mmol/L (ref 135–145)

## 2016-07-22 LAB — GLUCOSE, CAPILLARY
GLUCOSE-CAPILLARY: 120 mg/dL — AB (ref 65–99)
GLUCOSE-CAPILLARY: 193 mg/dL — AB (ref 65–99)
GLUCOSE-CAPILLARY: 146 mg/dL — AB (ref 65–99)
Glucose-Capillary: 166 mg/dL — ABNORMAL HIGH (ref 65–99)

## 2016-07-22 LAB — VANCOMYCIN, TROUGH: VANCOMYCIN TR: 35 ug/mL — AB (ref 15–20)

## 2016-07-22 MED ORDER — IPRATROPIUM-ALBUTEROL 0.5-2.5 (3) MG/3ML IN SOLN
3.0000 mL | Freq: Three times a day (TID) | RESPIRATORY_TRACT | Status: DC
  Administered 2016-07-22 – 2016-07-23 (×3): 3 mL via RESPIRATORY_TRACT
  Filled 2016-07-22 (×3): qty 3

## 2016-07-22 MED ORDER — AMOXICILLIN-POT CLAVULANATE 875-125 MG PO TABS
1.0000 | ORAL_TABLET | Freq: Two times a day (BID) | ORAL | Status: DC
  Administered 2016-07-22 – 2016-07-27 (×10): 1 via ORAL
  Filled 2016-07-22 (×11): qty 1

## 2016-07-22 NOTE — Care Management Important Message (Signed)
Important Message  Patient Details  Name: LAQUIA WIMPY MRN: [id number] Date of Birth: 05/25/1961   Medicare Important Message Given:  Yes    Kerin Salen 07/22/2016, 1:07 [place] Message  Patient Details  Name: IYLA MOCKABEE MRN: [id number] Date of Birth: 1960/10/08   Medicare Important Message Given:  Yes    Kerin Salen 07/22/2016, 1:07 PM

## 2016-07-22 NOTE — Progress Notes (Signed)
LCSWA met with patient at bedside, inquired about wellbeing. Patient reports she is feeling better today but understands she may not discharge to next week. LCSWA informed patient that [name] trying to keep a room available for her and to call on Monday closer to discharge date. Patient requested number to Facility, LCSWA provided contact number.  CSW will continue to follow to assist with disposition.   Kathrin Greathouse, Latanya Presser, MSW Clinical Social Worker 5E and Psychiatric Service Line [phone] 07/22/2016  4:22 PM

## 2016-07-22 NOTE — Progress Notes (Addendum)
Pharmacy Antibiotic Note  [name] is a 56 y.o. female admitted on 07/12/2016 with septic shock. Patient currently on day #10 of Zosyn. On 2/5, patient with fever of 102.4, and CXR showing patchy left mid-lower lung infiltrate. Therefore, MD added coverage for PNA with Vancomycin, and pharmacy consulted to assist with dosing. Today is day #5 of Vancomycin.   Plan:  0400 VT=35, Scr=0.72 (stable)  Vancomycin on hold for now d/t elevated trough  F/u additional level if pt remains on vancomycin  Follow renal function (BMET in AM), cultures, clinical course.    Height: 5\' 5"  (165.1 cm) Weight: 152 lb 8.9 oz (69.2 kg) IBW/kg (Calculated) : 57  Temp (24hrs), Avg:99.1 F (37.3 C), Min:99 F (37.2 C), Max:99.3 F (37.4 C)   Recent Labs Lab 07/16/16 0749 07/17/16 0342 07/18/16 0349 07/19/16 0426 07/20/16 0414 07/22/16 0353  WBC 16.7* 14.2* 15.3* 12.5* 10.6*  --   CREATININE 1.01* 0.82 0.59 0.62 0.86 0.72  VANCOTROUGH  --   --   --   --   --  35*    Estimated Creatinine Clearance: 75.8 mL/min (by C-G formula based on SCr of 0.72 mg/dL).    No Known Allergies   Antimicrobials this admission: 1/30 Vanc >> 2/1, restarted 2/5 >> 1/30 Zosyn >> 1/30 PO Vanc >> 2/2  Microbiology results: 1/30 BCx: 1/2 CoNS, likely contaminant 1/30 UCx: multiple species, suggest recollection 1/30 influenza PCR: neg 1/30 MRSA PCR: neg Cdiff: unable to be collected 2/6 BCx: NGTD  Thank you for allowing pharmacy to be a part of this patient's care.    Dorrene German 07/21/2016 4:42 PM

## 2016-07-22 NOTE — Progress Notes (Signed)
PROGRESS NOTE    [name]  [id number] DOB: [birth year] DOA: 07/12/2016 PCP: Blanchie Serve, MD     Brief Narrative:  [name] is a 56 yo female with past medical history of multiple sclerosis, history of DVT on Xarelto, hx of bilateral obstructing nephrolithiasis s/p bilateral perc nephrostomy tube in July 2017 and December 2017, who presented to the emergency department due to fevers. She was encephalopathic at time of admission. In the emergency department, she had a fever of 103, hypotensive, lactic acidosis, and hemoglobin of 4. She was initially admitted to [facility] services, with presumed sepsis secondary to UTI. She was treated with IV antibiotics, given blood products. She did require pressors for blood pressure support. She was transferred to [facility] with stabilization of BP on 07/15/16.  CT abdomen/pelvis was obtained as patient continued with leukocytosis. This showed multiloculated pelvic fluid collection. General surgery as well as urology were consulted. General surgery did not feel this was related to her bowel and has since signed off. Urology feels this is likely pelvic abscess vs urinoma. They recommend nephrostomy tubes bilaterally (these were in place prior to this admission) as well as foley in order to allow drainage of urine away from the bladder to allow healing. She has been on zosyn.   On evening of 2/5, she started to spike fevers with respiratory complaints. CXR obtained showed LLL infiltrate. Continue IV abx's. GI consulted given positive FOBT, low Hgb on admission and need for anticoagulation.   Assessment & Plan:   Principal Problem:   Septic shock ([place]) Active Problems:   Multiple sclerosis diagnosed 2002-not on Therapy any longer   DVT, lower extremity (HCC)   Chronic indwelling Foley catheter   AKI (acute kidney injury) ([place])   Controlled diabetes mellitus type 2 with complications ([place])   Pelvic abscess in female   Acute metabolic encephalopathy  Anemia of chronic disease   Essential hypertension  Shock, multifactorial: Sepsis -Septic as well as hemorrhagic -Required levophed, her pressure is now stable off pressors -Initially thought source to be secondary to UTI. She was started on IV Vanco and Zosyn, but urine culture has now resulted with multiple organisms, UA does show large leuk, numerous WBC, many bacteria. Previous urine culture has shown pan-sensitive E Coli and Pseudomonas, resistant providencia  -CT renal stone on 1/30 with questionable cystitis, questionable diverticulitis or colitis  -Due to diarrhea, C. difficile PCR was ordered as well as oral vancomycin. However, before sample could be obtained, she stopped having diarrhea. Therefore PCR test, enteric precaution, oral vanco are stopped.  -Blood culture with 1 of 2 coag neg staph, likely contaminant  -Influenza negative  -CT abd/pelvis obtained due to continued leukocytosis, this showed multiloculated pelvic fluid collection  -Urology consulted: likely pelvic abscess vs urinoma. Continue nephrostomy tubes and foley to allow drainage of urine away from bladder for healing  -will narrow abx's to PO Augmentin  Now. -Repeat blood culture pending  -also treating HCAP see below -sepsis features resolved. Hgb and VS remains stable.  LLL HCAP -Respiratory complaints started 2/5.  -Re-started vanco along with zosyn; which was given for 3 days. Will now transition to Augmentin to complete therapy -patient afebrile for 36 hours now and with WBC's essentially back to normal. -will continue to weaned O2 supplementation as tolerated -continue PRN duoneb -will also continue pulmicort and flutter valve   Acute metabolic encephalopathy -Now resolved and at baseline -most likely associated with fever and acute infection.   Acute on chronic  normocytic anemia -Baseline Hgb 8.2-9.5 -Unclear etiology, had hemoccult positive stool but no report of melena or hematochezia to explain  Hgb of 4 on admission  -Received 2u pRBC on 1/30  -Iron studies are not consistent with iron deficiency anemia -INR normal, LDH normal, fibrinogen elevated, Haptoglobin 308. Not consistent with hemolytic anemia or DIC  -Vit B 12: 2,254. Folate: 1,996 -Continue to trend CBC intermitently. Hgb 7.9 (last checked on 2/7) -discussed with GI and will plan for EGD and/or colonoscopy on 07/25/16 -no signs of acute bleeding currently  AKI  -Baseline Cr 0.5-0.7, Cr was 1.75 on admission -Resolved with IVF  -will monitor trend  History of DVT -Xarelto was held on admission due to anemia -Currently on SCDs -will follow Hgb trend and resume anticoagulation at discharge (if safe)  -no signs of acute bleeding currently   DM type 2, well controlled  -continue holding oral home meds  -Ha1c 5.3 -will continue SSI and modified carb diet   Essential HTN -Holding home antihypertensives due to shock on admission, BP currently stable without meds -will monitor and resume antihypertensive agents as needed   Hypokalemia -will replete as needed  -follow BMET to assess trend    DVT prophylaxis: SCDs Code Status: Full Family Communication: no family at bedside Disposition Plan: pending further stabilization, improvement in her breathing. Back to SNF at discharge. Per GI rec's will follow EGD/colonoscopy eval on 07/25/16 (in order to maximize resp status from PNA, prior to use anesthesia)   Consultants:   PCCM  General surgery  Urology   GI  Procedures:   None  Antimicrobials:  Anti-infectives    Start     Dose/Rate Route Frequency Ordered Stop   07/22/16 1400  amoxicillin-clavulanate (AUGMENTIN) 875-125 MG per tablet 1 tablet     1 tablet Oral Every 12 hours 07/22/16 1245     07/19/16 0600  vancomycin (VANCOCIN) IVPB 1000 mg/200 mL premix  Status:  Discontinued     1,000 mg 200 mL/hr over 60 Minutes Intravenous Every 12 hours 07/18/16 1810 07/22/16 0500   07/18/16 1815  vancomycin  (VANCOCIN) IVPB 1000 mg/200 mL premix     1,000 mg 200 mL/hr over 60 Minutes Intravenous STAT 07/18/16 1810 07/18/16 1940   07/13/16 1400  vancomycin (VANCOCIN) IVPB 1000 mg/200 mL premix  Status:  Discontinued     1,000 mg 200 mL/hr over 60 Minutes Intravenous Every 24 hours 07/12/16 2058 07/14/16 1109   07/13/16 1200  vancomycin (VANCOCIN) IVPB 1000 mg/200 mL premix  Status:  Discontinued     1,000 mg 200 mL/hr over 60 Minutes Intravenous Every 24 hours 07/12/16 1412 07/12/16 2051   07/13/16 0000  vancomycin (VANCOCIN) 50 mg/mL oral solution 500 mg  Status:  Discontinued     500 mg Oral Every 6 hours 07/12/16 2051 07/15/16 1328   07/12/16 2200  piperacillin-tazobactam (ZOSYN) IVPB 3.375 g  Status:  Discontinued     3.375 g 12.5 mL/hr over 240 Minutes Intravenous Every 8 hours 07/12/16 1412 07/12/16 2051   07/12/16 2200  piperacillin-tazobactam (ZOSYN) IVPB 3.375 g  Status:  Discontinued     3.375 g 12.5 mL/hr over 240 Minutes Intravenous Every 8 hours 07/12/16 2058 07/22/16 1245   07/12/16 1315  piperacillin-tazobactam (ZOSYN) IVPB 3.375 g     3.375 g 100 mL/hr over 30 Minutes Intravenous  Once 07/12/16 1314 07/12/16 1356   07/12/16 1315  vancomycin (VANCOCIN) IVPB 1000 mg/200 mL premix     1,000 mg 200 mL/hr over 60  Minutes Intravenous  Once 07/12/16 1314 07/12/16 1512      Subjective: Patient now afebrile for 36 hours. Still requiring 2L [place] supplementation after failing weaning off attempt on 2/8. Breathing is not labored, still with some rhonchi and exp wheezes on exam.  Objective: Vitals:   07/22/16 1504 07/22/16 1850 07/22/16 1941 07/22/16 2028  BP: (!) 163/68   (!) 143/79  Pulse:    96  Resp: 18   18  Temp: 98.8 F (37.1 C)   99.6 F (37.6 C)  TempSrc: Oral   Oral  SpO2: 100%  99% 100%  Weight:  68.3 kg (150 lb 9.2 oz)    Height:        Intake/Output Summary (Last 24 hours) at 07/22/16 2220 Last data filed at 07/22/16 2200  Gross per 24 hour  Intake               720 ml  Output             2725 ml  Net            -2005 ml   Filed Weights   07/20/16 0500 07/21/16 0955 07/22/16 1850  Weight: 69.9 kg (154 lb 1.6 oz) 69.2 kg (152 lb 8.9 oz) 68.3 kg (150 lb 9.2 oz)   Examination:  General exam: Appears calm and comfortable, report breathing and feeling better overall. Patient now with 36 hours w/o fever. No CP, no nausea, no vomiting and no reports of melena or hematochezia. Patient still requiring 2L [place] supplementation, failed when weaned off yesterday (desaturated in mid 73's on RA).  Respiratory system: fair air movement, rhonchi L > R; positive mild exp wheezing. Respiratory effort normal. Cardiovascular system: S1 & S2 heard, RRR. No JVD, murmurs, rubs, gallops or clicks. No pedal edema. Gastrointestinal system: Abdomen is nondistended, soft,denies tenderness on palpation. No organomegaly or masses felt. Normal bowel sounds heard. Gu: Bilateral nephrostomy tubes present, foley in place  Central nervous system: Alert and oriented. CN intact, following commands properly. Patient with chronic motor deficits from MS. Extremities: no swelling, no cyanosis  Skin: No acute rashes or bruises, patient with +chronic bilateral feet with venous stasis lesions/skin changes  Psychiatry: Judgement and insight appear normal. Mood & affect appropriate.   Data Reviewed: I have personally reviewed following labs and imaging studies  CBC:  Recent Labs Lab 07/16/16 0749 07/17/16 0342 07/18/16 0349 07/19/16 0426 07/20/16 0414  WBC 16.7* 14.2* 15.3* 12.5* 10.6*  NEUTROABS 13.1* 9.2* 10.6* 9.0* 6.9  HGB 7.9* 7.8* 8.2* 8.2* 7.9*  HCT 23.0* 23.7* 25.4* 24.8* 25.2*  MCV 87.8 87.5 87.6 88.3 87.8  PLT 310 369 441* 443* [id number]*   Basic Metabolic Panel:  Recent Labs Lab 07/17/16 0342 07/18/16 0349 07/19/16 0426 07/20/16 0414 07/22/16 0353  NA 144 144 143 143 140  K 3.3* 3.2* 3.2* 3.7 3.6  CL 119* 117* 116* 115* 110  CO2 22 22 24 24 26   GLUCOSE 203* 106* 88  125* 148*  BUN 7 <5* <5* 5* 7  CREATININE 0.82 0.59 0.62 0.86 0.72  CALCIUM 9.0 9.1 8.8* 8.7* 8.8*   GFR: Estimated Creatinine Clearance: 69.8 mL/min (by C-G formula based on SCr of 0.72 mg/dL).   Liver Function Tests:  Recent Labs Lab 07/16/16 0749  AST 30  ALT 47  ALKPHOS 136*  BILITOT 0.6  PROT 6.0*  ALBUMIN 1.9*   CBG:  Recent Labs Lab 07/21/16 2109 07/22/16 0725 07/22/16 1139 07/22/16 1656 07/22/16 2031  GLUCAP 116*  120* 193* 146* 166*   Sepsis Labs: No results for input(s): PROCALCITON, LATICACIDVEN in the last 168 hours.  Recent Results (from the past 240 hour(s))  Culture, blood (routine x 2)     Status: None (Preliminary result)   Collection Time: 07/19/16  8:02 AM  Result Value Ref Range Status   Specimen Description BLOOD RIGHT HAND  Final   Special Requests BOTTLES DRAWN AEROBIC ONLY 5CC  Final   Culture   Final    NO GROWTH 3 DAYS Performed at [facility], [address], [place], [place] [address]    Report Status PENDING  Incomplete  Culture, blood (routine x 2)     Status: None (Preliminary result)   Collection Time: 07/19/16  8:04 AM  Result Value Ref Range Status   Specimen Description BLOOD LEFT HAND  Final   Special Requests IN PEDIATRIC BOTTLE 3CC  Final   Culture   Final    NO GROWTH 3 DAYS Performed at [facility], [place] [address], [place], [place] [address]    Report Status PENDING  Incomplete     Radiology Studies: No results found.   Scheduled Meds: . amoxicillin-clavulanate  1 tablet Oral Q12H  . atorvastatin  10 mg Oral QHS  . budesonide (PULMICORT) nebulizer solution  0.25 mg Nebulization BID  . famotidine  20 mg Oral BID  . feeding supplement (ENSURE ENLIVE)  237 mL Oral BID BM  . hydrocerin   Topical BID  . insulin aspart  0-9 Units Subcutaneous TID WC  . ipratropium-albuterol  3 mL Nebulization TID   Continuous Infusions:    LOS: 10 days    Time spent: 25 minutes   Barton Dubois,  MD [phone] [facility] [email or web address] Password [email or web address] 07/22/2016, 10:20 PM

## 2016-07-22 NOTE — Progress Notes (Signed)
(  no charge--patient not seen today)  I have (tentatively) scheduled patient for egd/colon under propofol sedation for Monday, Feb 12 at 9 am (Dr. Oletta Lamas).  Dr. Penelope Coop will be rounding for [place] this weekend, and based on patient's medical condition, will either order prep/NPO for procedure, or cancel procedure.  Cleotis Nipper, M.D. Pager [phone] If no answer or after 5 PM call [phone]

## 2016-07-23 LAB — CBC
HEMATOCRIT: 24.6 % — AB (ref 36.0–46.0)
Hemoglobin: 7.9 g/dL — ABNORMAL LOW (ref 12.0–15.0)
MCH: 28.7 pg (ref 26.0–34.0)
MCHC: 32.1 g/dL (ref 30.0–36.0)
MCV: 89.5 fL (ref 78.0–100.0)
Platelets: 425 10*3/uL — ABNORMAL HIGH (ref 150–400)
RBC: 2.75 MIL/uL — AB (ref 3.87–5.11)
RDW: 17.5 % — ABNORMAL HIGH (ref 11.5–15.5)
WBC: 14 10*3/uL — AB (ref 4.0–10.5)

## 2016-07-23 LAB — GLUCOSE, CAPILLARY
GLUCOSE-CAPILLARY: 120 mg/dL — AB (ref 65–99)
Glucose-Capillary: 130 mg/dL — ABNORMAL HIGH (ref 65–99)
Glucose-Capillary: 235 mg/dL — ABNORMAL HIGH (ref 65–99)

## 2016-07-23 MED ORDER — IPRATROPIUM-ALBUTEROL 0.5-2.5 (3) MG/3ML IN SOLN
3.0000 mL | Freq: Two times a day (BID) | RESPIRATORY_TRACT | Status: DC
  Administered 2016-07-23 – 2016-07-26 (×6): 3 mL via RESPIRATORY_TRACT
  Filled 2016-07-23 (×5): qty 3

## 2016-07-23 NOTE — Progress Notes (Signed)
Subjective: Patient reports no significant abdominal pain.  Objective: Vital signs in last 24 hours: Temp:  [98.8 F (37.1 C)-99.6 F (37.6 C)] 99.6 F (37.6 C) (02/10 0449) Pulse Rate:  [88-96] 88 (02/10 0449) Resp:  [18-19] 19 (02/10 0449) BP: (143-163)/(68-88) 148/88 (02/10 0449) SpO2:  [95 %-100 %] 100 % (02/10 0825) Weight:  [68.3 kg (150 lb 9.2 oz)-68.6 kg (151 lb 3.8 oz)] 68.6 kg (151 lb 3.8 oz) (02/10 0438)  Intake/Output from previous day: 02/09 0701 - 02/10 0700 In: 76 [P.O.:820] Out: 2700 [Urine:2700] Intake/Output this shift: Total I/O In: 240 [P.O.:240] Out: 900 [Urine:900]  Physical Exam:  Constitutional: Vital signs reviewed. WD WN in NAD   Eyes: PERRL, No scleral icterus.   Pulmonary/Chest: Normal effort Extremities: No cyanosis or edema   Lab Results:  Recent Labs  07/23/16 0905  HGB 7.9*  HCT 24.6*   BMET  Recent Labs  07/22/16 0353  NA 140  K 3.6  CL 110  CO2 26  GLUCOSE 148*  BUN 7  CREATININE 0.72  CALCIUM 8.8*   No results for input(s): LABPT, INR in the last 72 hours. No results for input(s): LABURIN in the last 72 hours. Results for orders placed or performed during the hospital encounter of 07/12/16  Culture, blood (Routine x 2)     Status: Abnormal   Collection Time: 07/12/16  1:13 PM  Result Value Ref Range Status   Specimen Description BLOOD LEFT ANTECUBITAL  Final   Special Requests BOTTLES DRAWN AEROBIC AND ANAEROBIC 5CC  Final   Culture  Setup Time   Final    GRAM POSITIVE COCCI IN CLUSTERS ANAEROBIC BOTTLE ONLY CRITICAL RESULT CALLED TO, READ BACK BY AND VERIFIED WITH: C. Shade Pharm.D. 15:15 07/13/16 ([email or web address])    Culture (A)  Final    STAPHYLOCOCCUS SPECIES (COAGULASE NEGATIVE) THE SIGNIFICANCE OF ISOLATING THIS ORGANISM FROM A SINGLE SET OF BLOOD CULTURES WHEN MULTIPLE SETS ARE DRAWN IS UNCERTAIN. PLEASE NOTIFY THE MICROBIOLOGY DEPARTMENT WITHIN ONE WEEK IF SPECIATION AND SENSITIVITIES ARE REQUIRED. Performed at  [facility], [place] [address], [place], [place] [address]    Report Status 07/16/2016 FINAL  Final  Culture, blood (Routine x 2)     Status: None   Collection Time: 07/12/16  1:13 PM  Result Value Ref Range Status   Specimen Description BLOOD LEFT ANTECUBITAL  Final   Special Requests BOTTLES DRAWN AEROBIC AND ANAEROBIC 5CC  Final   Culture   Final    NO GROWTH 5 DAYS Performed at [facility], [place] [address] [place], [place] [address]    Report Status 07/17/2016 FINAL  Final  Blood Culture ID Panel (Reflexed)     Status: Abnormal   Collection Time: 07/12/16  1:13 PM  Result Value Ref Range Status   Enterococcus species NOT DETECTED NOT DETECTED Final   Listeria monocytogenes NOT DETECTED NOT DETECTED Final   Staphylococcus species DETECTED (A) NOT DETECTED Final    Comment: Methicillin (oxacillin) susceptible coagulase negative staphylococcus. Possible blood culture contaminant (unless isolated from more than one blood culture draw or clinical case suggests pathogenicity). No antibiotic treatment is indicated for blood  culture contaminants. CRITICAL RESULT CALLED TO, READ BACK BY AND VERIFIED WITH: C. Shade Pharm.D. 15:15 07/13/16 ([email or web address])    Staphylococcus aureus NOT DETECTED NOT DETECTED Final   Methicillin resistance NOT DETECTED NOT DETECTED Final   Streptococcus species NOT DETECTED NOT DETECTED Final   Streptococcus agalactiae NOT DETECTED NOT DETECTED Final  Streptococcus pneumoniae NOT DETECTED NOT DETECTED Final   Streptococcus pyogenes NOT DETECTED NOT DETECTED Final   Acinetobacter baumannii NOT DETECTED NOT DETECTED Final   Enterobacteriaceae species NOT DETECTED NOT DETECTED Final   Enterobacter cloacae complex NOT DETECTED NOT DETECTED Final   Escherichia coli NOT DETECTED NOT DETECTED Final   Klebsiella oxytoca NOT DETECTED NOT DETECTED Final   Klebsiella pneumoniae NOT DETECTED NOT DETECTED Final   Proteus species NOT DETECTED NOT DETECTED Final    Serratia marcescens NOT DETECTED NOT DETECTED Final   Haemophilus influenzae NOT DETECTED NOT DETECTED Final   Neisseria meningitidis NOT DETECTED NOT DETECTED Final   Pseudomonas aeruginosa NOT DETECTED NOT DETECTED Final   Candida albicans NOT DETECTED NOT DETECTED Final   Candida glabrata NOT DETECTED NOT DETECTED Final   Candida krusei NOT DETECTED NOT DETECTED Final   Candida parapsilosis NOT DETECTED NOT DETECTED Final   Candida tropicalis NOT DETECTED NOT DETECTED Final    Comment: Performed at [facility], [place] [address] [place], [place] [address]  Urine culture     Status: Abnormal   Collection Time: 07/12/16  5:33 PM  Result Value Ref Range Status   Specimen Description OTHER  Final   Special Requests NONE  Final   Culture MULTIPLE SPECIES PRESENT, SUGGEST RECOLLECTION (A)  Final   Report Status 07/14/2016 FINAL  Final  MRSA PCR Screening     Status: None   Collection Time: 07/12/16  8:53 PM  Result Value Ref Range Status   MRSA by PCR NEGATIVE NEGATIVE Final    Comment:        The GeneXpert MRSA Assay (FDA approved for NASAL specimens only), is one component of a comprehensive MRSA colonization surveillance program. It is not intended to diagnose MRSA infection nor to guide or monitor treatment for MRSA infections.   Culture, blood (routine x 2)     Status: None (Preliminary result)   Collection Time: 07/19/16  8:02 AM  Result Value Ref Range Status   Specimen Description BLOOD RIGHT HAND  Final   Special Requests BOTTLES DRAWN AEROBIC ONLY 5CC  Final   Culture   Final    NO GROWTH 4 DAYS Performed at [facility], [address] [place], [place] [address]    Report Status PENDING  Incomplete  Culture, blood (routine x 2)     Status: None (Preliminary result)   Collection Time: 07/19/16  8:04 AM  Result Value Ref Range Status   Specimen Description BLOOD LEFT HAND  Final   Special Requests IN PEDIATRIC BOTTLE 3CC  Final   Culture   Final    NO  GROWTH 4 DAYS Performed at [facility], [place] [address] [place], [place] [address]    Report Status PENDING  Incomplete    Studies/Results: No results found.  Assessment/Plan:   Pelvic abscess, with evidence of bladder perforation which reaccumulated following In right nephrostomy tube.  She is improving.  I do agree with colonoscopy to rule out GI involvement with this process.    She will need long-term nephrostomy tube drainage bilaterally.  I do think that placing suprapubic tube from a different angle will prevent the tip of a large catheter from irritating the dome of the bladder, perpetuating her inflammatory/perforation process.  I will ask interventional radiology to consider placing a suprapubic tube which will allow [place] to take her urethral catheter out.   LOS: 11 days   Franchot Gallo M 07/23/2016, 12:15 PM

## 2016-07-23 NOTE — Progress Notes (Signed)
PROGRESS NOTE    [name]  [id number] DOB: Nov 18, 1960 DOA: 07/12/2016 PCP: Blanchie Serve, MD     Brief Narrative:  [name] is a 56 yo female with past medical history of multiple sclerosis, history of DVT on Xarelto, hx of bilateral obstructing nephrolithiasis s/p bilateral perc nephrostomy tube in July 2017 and December 2017, who presented to the emergency department due to fevers. She was encephalopathic at time of admission. In the emergency department, she had a fever of 103, hypotensive, lactic acidosis, and hemoglobin of 4. She was initially admitted to [facility] services, with presumed sepsis secondary to UTI. She was treated with IV antibiotics, given blood products. She did require pressors for blood pressure support. She was transferred to [facility] with stabilization of BP on 07/15/16.  CT abdomen/pelvis was obtained as patient continued with leukocytosis. This showed multiloculated pelvic fluid collection. General surgery as well as urology were consulted. General surgery did not feel this was related to her bowel and has since signed off. Urology feels this is likely pelvic abscess vs urinoma. They recommend nephrostomy tubes bilaterally (these were in place prior to this admission) as well as foley in order to allow drainage of urine away from the bladder to allow healing. She has been on zosyn.   On evening of 2/5, she started to spike fevers with respiratory complaints. CXR obtained showed LLL infiltrate. Continue IV abx's. GI consulted given positive FOBT, low Hgb on admission and need for anticoagulation.   Assessment & Plan:   Principal Problem:   Septic shock ([place]) Active Problems:   Multiple sclerosis diagnosed 2002-not on Therapy any longer   DVT, lower extremity (HCC)   Chronic indwelling Foley catheter   AKI (acute kidney injury) ([place])   Controlled diabetes mellitus type 2 with complications ([place])   Pelvic abscess in female   Acute metabolic encephalopathy  Anemia of chronic disease   Essential hypertension  Shock, multifactorial: Sepsis -Septic as well as hemorrhagic -Required levophed, her pressure is now stable off pressors -Initially thought source to be secondary to UTI. She was started on IV Vanco and Zosyn, but urine culture has now resulted with multiple organisms, UA does show large leuk, numerous WBC, many bacteria. Previous urine culture has shown pan-sensitive E Coli and Pseudomonas, resistant providencia  -CT renal stone on 1/30 with questionable cystitis, questionable diverticulitis or colitis  -Due to diarrhea, C. difficile PCR was ordered as well as oral vancomycin. However, before sample could be obtained, she stopped having diarrhea. Therefore PCR test, enteric precaution, oral vanco are stopped.  -Blood culture with 1 of 2 coag neg staph, likely contaminant  -Influenza negative  -CT abd/pelvis obtained due to continued leukocytosis, this showed multiloculated pelvic fluid collection  -Urology consulted: likely pelvic abscess vs urinoma. Continue nephrostomy tubes and foley to allow drainage of urine away from bladder for healing  -will continue narrowed abx's by mouth Augmentin Now. -Repeat blood culture pending  -also treating HCAP see below -sepsis features resolved. Hgb and VS remains stable.  LLL HCAP -Respiratory complaints started 2/5.  -Re-started vanco along with zosyn; which was given for 3 days. Will now transition to Augmentin to complete therapy -patient afebrile for 36 hours now and with WBC's essentially back to normal. -will continue to weaned O2 supplementation as tolerated -continue PRN duoneb -will also continue pulmicort and flutter valve   Acute metabolic encephalopathy -Now resolved and at baseline -most likely associated with fever and acute infection.   Acute on chronic  normocytic anemia -Baseline Hgb 8.2-9.5 -Unclear etiology, had hemoccult positive stool but no report of melena or hematochezia  to explain Hgb of 4 on admission  -Received 2u pRBC on 1/30  -Iron studies are not consistent with iron deficiency anemia -INR normal, LDH normal, fibrinogen elevated, Haptoglobin 308. Not consistent with hemolytic anemia or DIC  -Vit B 12: 2,254. Folate: 1,996 -Continue to trend CBC intermitently. Hgb 7.9 (last checked on 2/10) -discussed with GI and will plan for EGD and/or colonoscopy on 07/25/16 -no signs of acute bleeding currently  AKI  -Baseline Cr 0.5-0.7, Cr was 1.75 on admission -Resolved with IVF  -will monitor trend  History of DVT -Xarelto was held on admission due to anemia -Currently on SCDs -will follow Hgb trend and resume anticoagulation at discharge (if safe)  -no signs of acute bleeding currently   DM type 2, well controlled  -continue holding oral home meds  -Ha1c 5.3 -will continue SSI and modified carb diet   Essential HTN -Holding home antihypertensives due to shock on admission, BP currently stable without meds -will monitor and resume antihypertensive agents as needed   Hypokalemia -will replete as needed  -follow BMET to assess trend    DVT prophylaxis: SCDs Code Status: Full Family Communication: no family at bedside Disposition Plan: pending further stabilization, improvement in her breathing. Back to SNF at discharge. Per GI rec's will follow EGD/colonoscopy eval on 07/25/16 (in order to maximize resp status from PNA, prior to use anesthesia)   Consultants:   PCCM  General surgery  Urology   GI  Procedures:   None  Antimicrobials:  Anti-infectives    Start     Dose/Rate Route Frequency Ordered Stop   07/22/16 1400  amoxicillin-clavulanate (AUGMENTIN) 875-125 MG per tablet 1 tablet     1 tablet Oral Every 12 hours 07/22/16 1245     07/19/16 0600  vancomycin (VANCOCIN) IVPB 1000 mg/200 mL premix  Status:  Discontinued     1,000 mg 200 mL/hr over 60 Minutes Intravenous Every 12 hours 07/18/16 1810 07/22/16 0500   07/18/16 1815   vancomycin (VANCOCIN) IVPB 1000 mg/200 mL premix     1,000 mg 200 mL/hr over 60 Minutes Intravenous STAT 07/18/16 1810 07/18/16 1940   07/13/16 1400  vancomycin (VANCOCIN) IVPB 1000 mg/200 mL premix  Status:  Discontinued     1,000 mg 200 mL/hr over 60 Minutes Intravenous Every 24 hours 07/12/16 2058 07/14/16 1109   07/13/16 1200  vancomycin (VANCOCIN) IVPB 1000 mg/200 mL premix  Status:  Discontinued     1,000 mg 200 mL/hr over 60 Minutes Intravenous Every 24 hours 07/12/16 1412 07/12/16 2051   07/13/16 0000  vancomycin (VANCOCIN) 50 mg/mL oral solution 500 mg  Status:  Discontinued     500 mg Oral Every 6 hours 07/12/16 2051 07/15/16 1328   07/12/16 2200  piperacillin-tazobactam (ZOSYN) IVPB 3.375 g  Status:  Discontinued     3.375 g 12.5 mL/hr over 240 Minutes Intravenous Every 8 hours 07/12/16 1412 07/12/16 2051   07/12/16 2200  piperacillin-tazobactam (ZOSYN) IVPB 3.375 g  Status:  Discontinued     3.375 g 12.5 mL/hr over 240 Minutes Intravenous Every 8 hours 07/12/16 2058 07/22/16 1245   07/12/16 1315  piperacillin-tazobactam (ZOSYN) IVPB 3.375 g     3.375 g 100 mL/hr over 30 Minutes Intravenous  Once 07/12/16 1314 07/12/16 1356   07/12/16 1315  vancomycin (VANCOCIN) IVPB 1000 mg/200 mL premix     1,000 mg 200 mL/hr over 60  Minutes Intravenous  Once 07/12/16 1314 07/12/16 1512      Subjective: Patient remains afebrile and endorses breathing easier. No melena or hematochezia reported.   Objective: Vitals:   07/23/16 0438 07/23/16 0449 07/23/16 0821 07/23/16 0825  BP:  (!) 148/88    Pulse:  88    Resp:  19    Temp:  99.6 F (37.6 C)    TempSrc:  Oral    SpO2:  100% 100% 100%  Weight: 68.6 kg (151 lb 3.8 oz)     Height:        Intake/Output Summary (Last 24 hours) at 07/23/16 1351 Last data filed at 07/23/16 1142  Gross per 24 hour  Intake              580 ml  Output             3100 ml  Net            -2520 ml   Filed Weights   07/21/16 0955 07/22/16 1850  07/23/16 0438  Weight: 69.2 kg (152 lb 8.9 oz) 68.3 kg (150 lb 9.2 oz) 68.6 kg (151 lb 3.8 oz)   Examination:  General exam: Appears calm and comfortable, report breathing better and feeling ok. Patient now with 48 hours w/o fever. And tolerating antibiotics by mouth. No CP, no nausea, no vomiting and no reports of melena or hematochezia. Patient still using 2L [place] supplementation. Respiratory system: fair air movement, rhonchi L > R; positive mild exp wheezing. Respiratory effort normal. Cardiovascular system: S1 & S2 heard, RRR. No JVD, murmurs, rubs, gallops or clicks. No pedal edema. Gastrointestinal system: Abdomen is nondistended, soft,denies tenderness on palpation. No organomegaly or masses felt. Normal bowel sounds heard. Gu: Bilateral nephrostomy tubes present, foley in place  Central nervous system: Alert and oriented. CN intact, following commands properly. Patient with chronic motor deficits from MS. Extremities: no swelling, no cyanosis  Skin: No acute rashes or bruises, patient with +chronic bilateral feet with venous stasis lesions/skin changes  Psychiatry: Judgement and insight appear normal. Mood & affect appropriate.   Data Reviewed: I have personally reviewed following labs and imaging studies  CBC:  Recent Labs Lab 07/17/16 0342 07/18/16 0349 07/19/16 0426 07/20/16 0414 07/23/16 0905  WBC 14.2* 15.3* 12.5* 10.6* 14.0*  NEUTROABS 9.2* 10.6* 9.0* 6.9  --   HGB 7.8* 8.2* 8.2* 7.9* 7.9*  HCT 23.7* 25.4* 24.8* 25.2* 24.6*  MCV 87.5 87.6 88.3 87.8 89.5  PLT 369 441* 443* 447* [id number]*   Basic Metabolic Panel:  Recent Labs Lab 07/17/16 0342 07/18/16 0349 07/19/16 0426 07/20/16 0414 07/22/16 0353  NA 144 144 143 143 140  K 3.3* 3.2* 3.2* 3.7 3.6  CL 119* 117* 116* 115* 110  CO2 22 22 24 24 26   GLUCOSE 203* 106* 88 125* 148*  BUN 7 <5* <5* 5* 7  CREATININE 0.82 0.59 0.62 0.86 0.72  CALCIUM 9.0 9.1 8.8* 8.7* 8.8*   GFR: Estimated Creatinine Clearance: 75.4  mL/min (by C-G formula based on SCr of 0.72 mg/dL).   CBG:  Recent Labs Lab 07/22/16 1139 07/22/16 1656 07/22/16 2031 07/23/16 0722 07/23/16 1138  GLUCAP 193* 146* 166* 130* 235*   Sepsis Labs: No results for input(s): PROCALCITON, LATICACIDVEN in the last 168 hours.  Recent Results (from the past 240 hour(s))  Culture, blood (routine x 2)     Status: None (Preliminary result)   Collection Time: 07/19/16  8:02 AM  Result Value Ref Range Status  Specimen Description BLOOD RIGHT HAND  Final   Special Requests BOTTLES DRAWN AEROBIC ONLY 5CC  Final   Culture   Final    NO GROWTH 4 DAYS Performed at [facility], [place] [address], [place], [place] [address]    Report Status PENDING  Incomplete  Culture, blood (routine x 2)     Status: None (Preliminary result)   Collection Time: 07/19/16  8:04 AM  Result Value Ref Range Status   Specimen Description BLOOD LEFT HAND  Final   Special Requests IN PEDIATRIC BOTTLE 3CC  Final   Culture   Final    NO GROWTH 4 DAYS Performed at [facility], [place] [address], [place], [place] [address]    Report Status PENDING  Incomplete     Radiology Studies: No results found.   Scheduled Meds: . amoxicillin-clavulanate  1 tablet Oral Q12H  . atorvastatin  10 mg Oral QHS  . budesonide (PULMICORT) nebulizer solution  0.25 mg Nebulization BID  . famotidine  20 mg Oral BID  . feeding supplement (ENSURE ENLIVE)  237 mL Oral BID BM  . hydrocerin   Topical BID  . insulin aspart  0-9 Units Subcutaneous TID WC  . ipratropium-albuterol  3 mL Nebulization BID   Continuous Infusions:    LOS: 11 days    Time spent: 25 minutes   Barton Dubois, MD [phone] [facility] [email or web address] Password [email or web address] 07/23/2016, 1:51 PM

## 2016-07-23 NOTE — Progress Notes (Signed)
The patient was seen today and we talked about the possibility of endoscopy and colonoscopy on Monday. She is confined to bed or wheelchair. Prepping her colon could be a challenge and therefore I will start her on clear liquids today and prep tomorrow with the hopes of obtaining a better prep with a 2 day clear liquid diet.

## 2016-07-24 LAB — CULTURE, BLOOD (ROUTINE X 2)
CULTURE: NO GROWTH
Culture: NO GROWTH

## 2016-07-24 LAB — GLUCOSE, CAPILLARY
GLUCOSE-CAPILLARY: 101 mg/dL — AB (ref 65–99)
Glucose-Capillary: 101 mg/dL — ABNORMAL HIGH (ref 65–99)
Glucose-Capillary: 242 mg/dL — ABNORMAL HIGH (ref 65–99)
Glucose-Capillary: 107 mg/dL — ABNORMAL HIGH (ref 65–99)

## 2016-07-24 MED ORDER — PEG 3350-KCL-NA BICARB-NACL 420 G PO SOLR
4000.0000 mL | Freq: Once | ORAL | Status: AC
  Administered 2016-07-24: 4000 mL via ORAL

## 2016-07-24 NOTE — Progress Notes (Signed)
Subjective: Patient reports no fevers. Feeling better.   Objective: Vital signs in last 24 hours: Temp:  [99.5 F (37.5 C)-100.1 F (37.8 C)] 99.8 F (37.7 C) (02/11 0430) Pulse Rate:  [86-96] 86 (02/11 0430) Resp:  [18-19] 19 (02/11 0430) BP: (135-149)/(67-90) 135/88 (02/11 0430) SpO2:  [97 %-100 %] 99 % (02/11 0430)  Intake/Output from previous day: 02/10 0701 - 02/11 0700 In: 240 [P.O.:240] Out: 2550 [Urine:2550] Intake/Output this shift: No intake/output data recorded.  Physical Exam:  NAD Right and Left Nx with good, clear UOP Foley - clear urine   Lab Results:  Recent Labs  07/23/16 0905  HGB 7.9*  HCT 24.6*   BMET  Recent Labs  07/22/16 0353  NA 140  K 3.6  CL 110  CO2 26  GLUCOSE 148*  BUN 7  CREATININE 0.72  CALCIUM 8.8*   No results for input(s): LABPT, INR in the last 72 hours. No results for input(s): LABURIN in the last 72 hours. Results for orders placed or performed during the hospital encounter of 07/12/16  Culture, blood (Routine x 2)     Status: Abnormal   Collection Time: 07/12/16  1:13 PM  Result Value Ref Range Status   Specimen Description BLOOD LEFT ANTECUBITAL  Final   Special Requests BOTTLES DRAWN AEROBIC AND ANAEROBIC 5CC  Final   Culture  Setup Time   Final    GRAM POSITIVE COCCI IN CLUSTERS ANAEROBIC BOTTLE ONLY CRITICAL RESULT CALLED TO, READ BACK BY AND VERIFIED WITH: C. Shade Pharm.D. 15:15 07/13/16 ([email or web address])    Culture (A)  Final    STAPHYLOCOCCUS SPECIES (COAGULASE NEGATIVE) THE SIGNIFICANCE OF ISOLATING THIS ORGANISM FROM A SINGLE SET OF BLOOD CULTURES WHEN MULTIPLE SETS ARE DRAWN IS UNCERTAIN. PLEASE NOTIFY THE MICROBIOLOGY DEPARTMENT WITHIN ONE WEEK IF SPECIATION AND SENSITIVITIES ARE REQUIRED. Performed at [facility], [place] [address] [place], [place] [address]    Report Status 07/16/2016 FINAL  Final  Culture, blood (Routine x 2)     Status: None   Collection Time: 07/12/16  1:13 PM  Result Value Ref  Range Status   Specimen Description BLOOD LEFT ANTECUBITAL  Final   Special Requests BOTTLES DRAWN AEROBIC AND ANAEROBIC 5CC  Final   Culture   Final    NO GROWTH 5 DAYS Performed at [facility], [place] [address] [place], [place] [address]    Report Status 07/17/2016 FINAL  Final  Blood Culture ID Panel (Reflexed)     Status: Abnormal   Collection Time: 07/12/16  1:13 PM  Result Value Ref Range Status   Enterococcus species NOT DETECTED NOT DETECTED Final   Listeria monocytogenes NOT DETECTED NOT DETECTED Final   Staphylococcus species DETECTED (A) NOT DETECTED Final    Comment: Methicillin (oxacillin) susceptible coagulase negative staphylococcus. Possible blood culture contaminant (unless isolated from more than one blood culture draw or clinical case suggests pathogenicity). No antibiotic treatment is indicated for blood  culture contaminants. CRITICAL RESULT CALLED TO, READ BACK BY AND VERIFIED WITH: C. Shade Pharm.D. 15:15 07/13/16 ([email or web address])    Staphylococcus aureus NOT DETECTED NOT DETECTED Final   Methicillin resistance NOT DETECTED NOT DETECTED Final   Streptococcus species NOT DETECTED NOT DETECTED Final   Streptococcus agalactiae NOT DETECTED NOT DETECTED Final   Streptococcus pneumoniae NOT DETECTED NOT DETECTED Final   Streptococcus pyogenes NOT DETECTED NOT DETECTED Final   Acinetobacter baumannii NOT DETECTED NOT DETECTED Final   Enterobacteriaceae species NOT DETECTED NOT DETECTED Final  Enterobacter cloacae complex NOT DETECTED NOT DETECTED Final   Escherichia coli NOT DETECTED NOT DETECTED Final   Klebsiella oxytoca NOT DETECTED NOT DETECTED Final   Klebsiella pneumoniae NOT DETECTED NOT DETECTED Final   Proteus species NOT DETECTED NOT DETECTED Final   Serratia marcescens NOT DETECTED NOT DETECTED Final   Haemophilus influenzae NOT DETECTED NOT DETECTED Final   Neisseria meningitidis NOT DETECTED NOT DETECTED Final   Pseudomonas aeruginosa NOT DETECTED NOT  DETECTED Final   Candida albicans NOT DETECTED NOT DETECTED Final   Candida glabrata NOT DETECTED NOT DETECTED Final   Candida krusei NOT DETECTED NOT DETECTED Final   Candida parapsilosis NOT DETECTED NOT DETECTED Final   Candida tropicalis NOT DETECTED NOT DETECTED Final    Comment: Performed at [facility], [place] [address] [place], [place] [address]  Urine culture     Status: Abnormal   Collection Time: 07/12/16  5:33 PM  Result Value Ref Range Status   Specimen Description OTHER  Final   Special Requests NONE  Final   Culture MULTIPLE SPECIES PRESENT, SUGGEST RECOLLECTION (A)  Final   Report Status 07/14/2016 FINAL  Final  MRSA PCR Screening     Status: None   Collection Time: 07/12/16  8:53 PM  Result Value Ref Range Status   MRSA by PCR NEGATIVE NEGATIVE Final    Comment:        The GeneXpert MRSA Assay (FDA approved for NASAL specimens only), is one component of a comprehensive MRSA colonization surveillance program. It is not intended to diagnose MRSA infection nor to guide or monitor treatment for MRSA infections.   Culture, blood (routine x 2)     Status: None (Preliminary result)   Collection Time: 07/19/16  8:02 AM  Result Value Ref Range Status   Specimen Description BLOOD RIGHT HAND  Final   Special Requests BOTTLES DRAWN AEROBIC ONLY 5CC  Final   Culture   Final    NO GROWTH 4 DAYS Performed at [facility], [address], [place], [place] [address]    Report Status PENDING  Incomplete  Culture, blood (routine x 2)     Status: None (Preliminary result)   Collection Time: 07/19/16  8:04 AM  Result Value Ref Range Status   Specimen Description BLOOD LEFT HAND  Final   Special Requests IN PEDIATRIC BOTTLE 3CC  Final   Culture   Final    NO GROWTH 4 DAYS Performed at [facility], [place] [address], [place], [place] [address]    Report Status PENDING  Incomplete    Studies/Results: No results found.  Assessment/Plan:  Pelvic abscess,  with evidence of bladder perforation which reaccumulated following right nephrostomy tube cap trial.  Overall, patient stable.   Colonoscopy pending tomorrow to rule out GI involvement with this process.  She will need long-term nephrostomy tube drainage bilaterally.    Dr. Diona Fanti considering SP tube placement to redirect catheter tip away from dome. An SP tube might drain both the abscess and the bladder. Dr. Kathlene Cote saw in consult and recommends repeat CT to reassess abscesses. If not significantly improved, pt may need percutaneous abscess drainage prior to possible SP tube.   I reviewed multiple notes, consults and CT scans.    LOS: 12 days   [name] 07/24/2016, 10:43 AM

## 2016-07-24 NOTE — Progress Notes (Signed)
IR received request for SP tube. Urology note reviewed. Chart, imaging reviewed. Pt admitted with fever, sepsis, and pelvic abscesses, felt to be secondary to bladder rupture, not GI related. Also found with anemia and heme positive stool. Had been on Xarelto for hx of DVT, this has been discontinued. 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 Last exchanged 1/25 Pt has been on abx since admission. Urology request for SP to keep bladder decompressed and feel it may be less irritating to the bladder wall than traditional foley would be.  CT from 2/2 reviewed with/by Dr. Kathlene Cote. No further imaging since then. There is significant pelvic fluid collections anterior to the bladder that may be prohibitive for SP tube. Would recommend repeat CT to reassess abscesses. If not significantly improved, pt may need percutaneous abscess drainage prior to possible SP tube. She is tentatively scheduled for EDG/colonoscopy tomorrow pending quality of prep. This will also help evaluate if GI source. IR will follow along and review repeat CT and d/w Urology to decide next step.  Ascencion Dike PA-C Interventional Radiology 07/24/2016 9:18 AM

## 2016-07-24 NOTE — Progress Notes (Signed)
No complaints. Agreeable for EGD and Colonoscopy to evaluate heme positive stool and anemia. Will prep today. Scheduled for tomorrow at [place].

## 2016-07-24 NOTE — Progress Notes (Signed)
PROGRESS NOTE    [name]  [id number] DOB: Nov 05, 1960 DOA: 07/12/2016 PCP: Blanchie Serve, MD     Brief Narrative:  [name] is a 56 yo female with past medical history of multiple sclerosis, history of DVT on Xarelto, hx of bilateral obstructing nephrolithiasis s/p bilateral perc nephrostomy tube in July 2017 and December 2017, who presented to the emergency department due to fevers. She was encephalopathic at time of admission. In the emergency department, she had a fever of 103, hypotensive, lactic acidosis, and hemoglobin of 4. She was initially admitted to [facility] services, with presumed sepsis secondary to UTI. She was treated with IV antibiotics, given blood products. She did require pressors for blood pressure support. She was transferred to [facility] with stabilization of BP on 07/15/16.  CT abdomen/pelvis was obtained as patient continued with leukocytosis. This showed multiloculated pelvic fluid collection. General surgery as well as urology were consulted. General surgery did not feel this was related to her bowel and has since signed off. Urology feels this is likely pelvic abscess vs urinoma. They recommend nephrostomy tubes bilaterally (these were in place prior to this admission) as well as foley in order to allow drainage of urine away from the bladder to allow healing. She has been on zosyn.   On evening of 2/5, she started to spike fevers with respiratory complaints. CXR obtained showed LLL infiltrate. Continue IV abx's. GI consulted given positive FOBT, low Hgb on admission and need for anticoagulation.   Assessment & Plan:   Principal Problem:   Septic shock ([place]) Active Problems:   Multiple sclerosis diagnosed 2002-not on Therapy any longer   DVT, lower extremity (HCC)   Chronic indwelling Foley catheter   AKI (acute kidney injury) ([place])   Controlled diabetes mellitus type 2 with complications ([place])   Pelvic abscess in female   Acute metabolic encephalopathy  Anemia of chronic disease   Essential hypertension  Shock, multifactorial: Sepsis -Septic as well as hemorrhagic -Required levophed, her pressure is now stable off pressors -Initially thought source to be secondary to UTI. She was started on IV Vanco and Zosyn, but urine culture has now resulted with multiple organisms, UA does show large leuk, numerous WBC, many bacteria. Previous urine culture has shown pan-sensitive E Coli and Pseudomonas, resistant providencia  -CT renal stone on 1/30 with questionable cystitis, questionable diverticulitis or colitis  -Due to diarrhea, C. difficile PCR was ordered as well as oral vancomycin. However, before sample could be obtained, she stopped having diarrhea. Therefore PCR test, enteric precaution, oral vanco are stopped.  -Blood culture with 1 of 2 coag neg staph, likely contaminant  -Influenza negative  -CT abd/pelvis obtained due to continued leukocytosis, this showed multiloculated pelvic fluid collection  -Urology consulted: likely pelvic abscess vs urinoma. Continue nephrostomy tubes and foley to allow drainage of urine away from bladder for healing. -IR consulted by Urology for potential SP tube placement; plan is to repeat CT abd/pelvis again to reassess abscess and determine further intervention. -will continue narrowed abx's by mouth Augmentin. -Repeat blood culture pending  -also treating HCAP see below -sepsis features resolved. Hgb and VS remains stable.  LLL HCAP -Respiratory complaints started 2/5.  -Re-started vanco along with zosyn; which was given for 3 days. Will now transition to Augmentin to complete therapy -patient afebrile for 36 hours now and with WBC's essentially back to normal. -will continue to weaned O2 supplementation as tolerated -continue PRN duoneb -will also continue pulmicort and flutter valve  Acute metabolic encephalopathy -Now resolved and at baseline -most likely associated with fever and acute infection.    Acute on chronic normocytic anemia -Baseline Hgb 8.2-9.5 -Unclear etiology, had hemoccult positive stool but no report of melena or hematochezia to explain Hgb of 4 on admission  -Received 2u pRBC on 1/30  -Iron studies are not consistent with iron deficiency anemia -INR normal, LDH normal, fibrinogen elevated, Haptoglobin 308. Not consistent with hemolytic anemia or DIC  -Vit B 12: 2,254. Folate: 1,996 -Continue to trend CBC intermitently. Hgb 7.9 (last checked on 2/10) -discussed with GI and will plan for EGD and/or colonoscopy on 07/25/16 -no signs of acute bleeding currently  AKI  -Baseline Cr 0.5-0.7, Cr was 1.75 on admission -Resolved with IVF  -will monitor trend  History of DVT -Xarelto was held on admission due to anemia -Currently on SCDs -will follow Hgb trend and resume anticoagulation at discharge (if safe)  -no signs of acute bleeding currently   DM type 2, well controlled  -continue holding oral home meds  -Ha1c 5.3 -will continue SSI and modified carb diet   Essential HTN -Holding home antihypertensives due to shock on admission, BP currently stable without meds -will monitor and resume antihypertensive agents as needed   Hypokalemia -will replete as needed  -follow BMET to assess trend    DVT prophylaxis: SCDs Code Status: Full Family Communication: no family at bedside Disposition Plan: pending further stabilization, improvement in her breathing. Back to SNF at discharge. Per GI rec's will follow EGD/colonoscopy eval on 07/25/16 (in order to maximize resp status from PNA, prior to use anesthesia)   Consultants:   PCCM  General surgery  Urology   GI  IR  Procedures:   None  Antimicrobials:  Anti-infectives    Start     Dose/Rate Route Frequency Ordered Stop   07/22/16 1400  amoxicillin-clavulanate (AUGMENTIN) 875-125 MG per tablet 1 tablet     1 tablet Oral Every 12 hours 07/22/16 1245     07/19/16 0600  vancomycin (VANCOCIN) IVPB 1000  mg/200 mL premix  Status:  Discontinued     1,000 mg 200 mL/hr over 60 Minutes Intravenous Every 12 hours 07/18/16 1810 07/22/16 0500   07/18/16 1815  vancomycin (VANCOCIN) IVPB 1000 mg/200 mL premix     1,000 mg 200 mL/hr over 60 Minutes Intravenous STAT 07/18/16 1810 07/18/16 1940   07/13/16 1400  vancomycin (VANCOCIN) IVPB 1000 mg/200 mL premix  Status:  Discontinued     1,000 mg 200 mL/hr over 60 Minutes Intravenous Every 24 hours 07/12/16 2058 07/14/16 1109   07/13/16 1200  vancomycin (VANCOCIN) IVPB 1000 mg/200 mL premix  Status:  Discontinued     1,000 mg 200 mL/hr over 60 Minutes Intravenous Every 24 hours 07/12/16 1412 07/12/16 2051   07/13/16 0000  vancomycin (VANCOCIN) 50 mg/mL oral solution 500 mg  Status:  Discontinued     500 mg Oral Every 6 hours 07/12/16 2051 07/15/16 1328   07/12/16 2200  piperacillin-tazobactam (ZOSYN) IVPB 3.375 g  Status:  Discontinued     3.375 g 12.5 mL/hr over 240 Minutes Intravenous Every 8 hours 07/12/16 1412 07/12/16 2051   07/12/16 2200  piperacillin-tazobactam (ZOSYN) IVPB 3.375 g  Status:  Discontinued     3.375 g 12.5 mL/hr over 240 Minutes Intravenous Every 8 hours 07/12/16 2058 07/22/16 1245   07/12/16 1315  piperacillin-tazobactam (ZOSYN) IVPB 3.375 g     3.375 g 100 mL/hr over 30 Minutes Intravenous  Once 07/12/16 1314 07/12/16  1356   07/12/16 1315  vancomycin (VANCOCIN) IVPB 1000 mg/200 mL premix     1,000 mg 200 mL/hr over 60 Minutes Intravenous  Once 07/12/16 1314 07/12/16 1512      Subjective: Patient remains afebrile and endorses breathing easier. No melena or hematochezia reported.   Objective: Vitals:   07/24/16 0430 07/24/16 1112 07/24/16 1116 07/24/16 1230  BP: 135/88     Pulse: 86     Resp: 19     Temp: 99.8 F (37.7 C)     TempSrc: Oral     SpO2: 99% 99% 99%   Weight:    67.8 kg (149 lb 7.6 oz)  Height:        Intake/Output Summary (Last 24 hours) at 07/24/16 1519 Last data filed at 07/24/16 1230  Gross per  24 hour  Intake                0 ml  Output             1850 ml  Net            -1850 ml   Filed Weights   07/22/16 1850 07/23/16 0438 07/24/16 1230  Weight: 68.3 kg (150 lb 9.2 oz) 68.6 kg (151 lb 3.8 oz) 67.8 kg (149 lb 7.6 oz)   Examination:  General exam: Appears calm and comfortable, report breathing good and feeling ok. Patient has remained afebrile and tolerating antibiotics by mouth. No CP, no nausea, no vomiting and no reports of melena or hematochezia. Patient still using [place] supplementation (but down to 1L). Respiratory system: fair air movement, rhonchi L > R; positive mild exp wheezing. Respiratory effort normal. Cardiovascular system: S1 & S2 heard, RRR. No JVD, murmurs, rubs, gallops or clicks. No pedal edema. Gastrointestinal system: Abdomen is nondistended, soft,denies tenderness on palpation. No organomegaly or masses felt. Normal bowel sounds heard. Gu: Bilateral nephrostomy tubes present, foley in place  Central nervous system: Alert and oriented. CN intact, following commands properly. Patient with chronic motor deficits from MS. Extremities: no swelling, no cyanosis  Skin: No acute rashes or bruises, patient with +chronic bilateral feet with venous stasis lesions/skin changes  Psychiatry: Judgement and insight appear normal. Mood & affect appropriate.   Data Reviewed: I have personally reviewed following labs and imaging studies  CBC:  Recent Labs Lab 07/18/16 0349 07/19/16 0426 07/20/16 0414 07/23/16 0905  WBC 15.3* 12.5* 10.6* 14.0*  NEUTROABS 10.6* 9.0* 6.9  --   HGB 8.2* 8.2* 7.9* 7.9*  HCT 25.4* 24.8* 25.2* 24.6*  MCV 87.6 88.3 87.8 89.5  PLT 441* 443* 447* [id number]*   Basic Metabolic Panel:  Recent Labs Lab 07/18/16 0349 07/19/16 0426 07/20/16 0414 07/22/16 0353  NA 144 143 143 140  K 3.2* 3.2* 3.7 3.6  CL 117* 116* 115* 110  CO2 22 24 24 26   GLUCOSE 106* 88 125* 148*  BUN <5* <5* 5* 7  CREATININE 0.59 0.62 0.86 0.72  CALCIUM 9.1 8.8* 8.7*  8.8*   GFR: Estimated Creatinine Clearance: 69.8 mL/min (by C-G formula based on SCr of 0.72 mg/dL).   CBG:  Recent Labs Lab 07/23/16 0722 07/23/16 1138 07/23/16 1744 07/24/16 0809 07/24/16 1232  GLUCAP 130* 235* 120* 101* 242*   Sepsis Labs: No results for input(s): PROCALCITON, LATICACIDVEN in the last 168 hours.  Recent Results (from the past 240 hour(s))  Culture, blood (routine x 2)     Status: None   Collection Time: 07/19/16  8:02 AM  Result Value  Ref Range Status   Specimen Description BLOOD RIGHT HAND  Final   Special Requests BOTTLES DRAWN AEROBIC ONLY 5CC  Final   Culture   Final    NO GROWTH 5 DAYS Performed at [facility], [address] [place], [place] [address]    Report Status 07/24/2016 FINAL  Final  Culture, blood (routine x 2)     Status: None   Collection Time: 07/19/16  8:04 AM  Result Value Ref Range Status   Specimen Description BLOOD LEFT HAND  Final   Special Requests IN PEDIATRIC BOTTLE 3CC  Final   Culture   Final    NO GROWTH 5 DAYS Performed at [facility], [address], [place], [place] [address]    Report Status 07/24/2016 FINAL  Final     Radiology Studies: No results found.   Scheduled Meds: . amoxicillin-clavulanate  1 tablet Oral Q12H  . atorvastatin  10 mg Oral QHS  . budesonide (PULMICORT) nebulizer solution  0.25 mg Nebulization BID  . famotidine  20 mg Oral BID  . feeding supplement (ENSURE ENLIVE)  237 mL Oral BID BM  . hydrocerin   Topical BID  . insulin aspart  0-9 Units Subcutaneous TID WC  . ipratropium-albuterol  3 mL Nebulization BID   Continuous Infusions:    LOS: 12 days    Time spent: 25 minutes   Barton Dubois, MD [phone] [facility] [email or web address] Password [facility] 07/24/2016, 3:19 PM

## 2016-07-25 ENCOUNTER — Encounter (HOSPITAL_COMMUNITY)
Admission: EM | Disposition: A | Discharge status: Skilled Nursing Facility | Payer: Self-pay | Source: Home / Self Care | Attending: Internal Medicine

## 2016-07-25 ENCOUNTER — Inpatient Hospital Stay (HOSPITAL_COMMUNITY): Payer: Medicare Other | Admitting: Certified Registered Nurse Anesthetist

## 2016-07-25 ENCOUNTER — Encounter (HOSPITAL_COMMUNITY): Payer: Self-pay | Admitting: Certified Registered Nurse Anesthetist

## 2016-07-25 HISTORY — PX: ESOPHAGOGASTRODUODENOSCOPY (EGD) WITH PROPOFOL: SHX5813

## 2016-07-25 HISTORY — PX: COLONOSCOPY WITH PROPOFOL: SHX5780

## 2016-07-25 LAB — GLUCOSE, CAPILLARY
Glucose-Capillary: 120 mg/dL — ABNORMAL HIGH (ref 65–99)
Glucose-Capillary: 118 mg/dL — ABNORMAL HIGH (ref 65–99)
Glucose-Capillary: 121 mg/dL — ABNORMAL HIGH (ref 65–99)

## 2016-07-25 SURGERY — ESOPHAGOGASTRODUODENOSCOPY (EGD) WITH PROPOFOL
Anesthesia: Monitor Anesthesia Care

## 2016-07-25 MED ORDER — SODIUM CHLORIDE 0.9 % IV SOLN: INTRAVENOUS | Status: DC

## 2016-07-25 MED ORDER — LIDOCAINE 2% (20 MG/ML) 5 ML SYRINGE
INTRAMUSCULAR | Status: AC
  Filled 2016-07-25: qty 5

## 2016-07-25 MED ORDER — PROPOFOL 500 MG/50ML IV EMUL
INTRAVENOUS | Status: DC | PRN
Start: 2016-07-25 — End: 2016-07-25
  Administered 2016-07-25: 100 ug/kg/min via INTRAVENOUS

## 2016-07-25 MED ORDER — PROPOFOL 10 MG/ML IV BOLUS
INTRAVENOUS | Status: AC
  Filled 2016-07-25: qty 40

## 2016-07-25 MED ORDER — PROPOFOL 10 MG/ML IV BOLUS
INTRAVENOUS | Status: DC | PRN
  Administered 2016-07-25: 40 mg via INTRAVENOUS
  Administered 2016-07-25 (×3): 10 mg via INTRAVENOUS

## 2016-07-25 MED ORDER — SODIUM CHLORIDE 0.9 % IV SOLN
INTRAVENOUS | Status: DC | PRN
  Administered 2016-07-25: 14:00:00 via INTRAVENOUS

## 2016-07-25 SURGICAL SUPPLY — 24 items

## 2016-07-25 NOTE — Transfer of Care (Signed)
Immediate Anesthesia Transfer of Care Note  Patient: [name]  Procedure(s) Performed: Procedure(s): ESOPHAGOGASTRODUODENOSCOPY (EGD) WITH PROPOFOL (N/A) COLONOSCOPY WITH PROPOFOL (N/A)  Patient Location: PACU  Anesthesia Type:MAC  Level of Consciousness: awake, alert  and oriented  Airway & Oxygen Therapy: Patient Spontanous Breathing and Patient connected to nasal cannula oxygen  Post-op Assessment: Report given to RN and Post -op Vital signs reviewed and stable  Post vital signs: Reviewed and stable  Last Vitals:  Vitals:   07/25/16 0430 07/25/16 1326  BP: (!) 148/75 (!) 153/81  Pulse: 91 89  Resp: 20 (!) 21  Temp: 37.2 C 37 C    Last Pain:  Vitals:   07/25/16 1326  TempSrc: Oral  PainSc:          Complications: No apparent anesthesia complications

## 2016-07-25 NOTE — Anesthesia Postprocedure Evaluation (Signed)
Anesthesia Post Note  Patient: JERMAINE THOLL  Procedure(s) Performed: Procedure(s) (LRB): ESOPHAGOGASTRODUODENOSCOPY (EGD) WITH PROPOFOL (N/A) COLONOSCOPY WITH PROPOFOL (N/A)  Patient location during evaluation: PACU Anesthesia Type: MAC Level of consciousness: awake and alert Pain management: pain level controlled Vital Signs Assessment: post-procedure vital signs reviewed and stable Respiratory status: spontaneous breathing, nonlabored ventilation, respiratory function stable and patient connected to nasal cannula oxygen Cardiovascular status: stable and blood pressure returned to baseline Anesthetic complications: no       Last Vitals:  Vitals:   07/25/16 1510 07/25/16 1555  BP: (!) 145/73 (!) 146/70  Pulse:  85  Resp:  20  Temp:  37 C    Last Pain:  Vitals:   07/25/16 1555  TempSrc: Oral  PainSc:                  Riccardo Dubin

## 2016-07-25 NOTE — H&P (View-Only) (Signed)
Referring Provider:  Reino Bellis ([facility]) Primary Care Physician:  Blanchie Serve, MD Primary Gastroenterologist:  None (Unassigned)--has been scoped in hospital by Dr. Paulita Fujita  Reason for Consultation:  Heme positive anemia   HPI: [name] is a 56 y.o. female with multiple sclerosis, bilateral nephrostomies, admitted with sepsis about a week ago, then developed hospital-acquired pneumonia which is under treatment and improving.  At time of admission, her hemoglobin was 4.8 with a normal MCV; it has subsequently been holding steady around 8. Stool was Hemoccult positive at time of admission. Iron studies shortly after admission showed a low iron saturation of 7% and an elevated ferritin level which might be spurious as an acute phase reactant.  The patient is maintained on Xarelto is chronically because of a prior history of DVT. She is not on any ulcerogenic medications.  She underwent upper endoscopy by Dr. Paulita Fujita in July 2016 because of anemia on Xarelto, and that exam was normal.  The patient does not have any localizing GI tract symptoms, and has not noticed any visible evidence of GI tract blood loss such as melena, burgundy stools, or frank hematochezia. She does not have dyspeptic symptoms, anorexia, involuntary weight loss, constipation, or diarrhea.  The patient stays at [facility], an extended care facility. She gets around by electric wheelchair. She does not use the bathroom but instead uses diapers.   Past Medical History:  Diagnosis Date  . Acute renal failure superimposed on stage 3 chronic kidney disease ([place]) 07/21/2015  . Anemia    chronic   . Diabetes mellitus without complication ([place])   . Diabetic neuropathy ([place])   . DVT (deep venous thrombosis) ([place])   . DVT (deep venous thrombosis) ([place])   . GERD (gastroesophageal reflux disease)   . Hx of sepsis   . Hypertension   . Left nephrolithiasis   . Leukocytosis   . Lymph edema    chronic   .  Malnutrition of moderate degree 07/21/2015  . Multiple sclerosis diagnosed 2002-not on Therapy any longer 06/26/2012  . Muscle weakness (generalized)   . Neuromuscular disorder ([place])   . Neuromuscular dysfunction of bladder   . Polyneuropathy ([place])   . Presence of indwelling urinary catheter   . Protein calorie malnutrition ([place])   . Stage 4 decubitus ulcer ([place]) 07/05/2015  . Stroke ([place])   . UTI (lower urinary tract infection)     Past Surgical History:  Procedure Laterality Date  . ESOPHAGOGASTRODUODENOSCOPY Left 01/02/2015   Procedure: ESOPHAGOGASTRODUODENOSCOPY (EGD);  Surgeon: Arta Silence, MD;  Location: [facility];  Service: Endoscopy;  Laterality: Left;  . HERNIA MESH REMOVAL    . IR GENERIC HISTORICAL  02/23/2016   IR NEPHROSTOMY EXCHANGE LEFT 02/23/2016 Sandi Mariscal, MD [facility]  . IR GENERIC HISTORICAL  04/19/2016   IR NEPHROSTOMY EXCHANGE LEFT 04/19/2016 Arne Cleveland, MD [facility]  . IR GENERIC HISTORICAL  05/13/2016   IR NEPHROSTOMY EXCHANGE LEFT 05/13/2016 Corrie Mckusick, DO [facility]  . IR GENERIC HISTORICAL  05/13/2016   IR NEPHROSTOMY PLACEMENT RIGHT 05/13/2016 Corrie Mckusick, DO [facility]  . IR GENERIC HISTORICAL  07/07/2016   IR NEPHROSTOMY EXCHANGE RIGHT 07/07/2016 Jacqulynn Cadet, MD [facility]  . IR GENERIC HISTORICAL  07/07/2016   IR NEPHROSTOMY EXCHANGE LEFT 07/07/2016 Jacqulynn Cadet, MD [facility]  . NEPHROSTOMY TUBE PLACEMENT ([place] HX)    . TRANSURETHRAL RESECTION OF BLADDER TUMOR N/A 03/16/2016   Procedure: CYSTOSCOPY BLADDER BIOPSY;  Surgeon: Franchot Gallo, MD;  Location: [facility];  Service: Urology;  Laterality: N/A;  . TUBAL LIGATION    . tubes tided      Prior to Admission medications   Medication Sig Start Date End Date Taking? Authorizing Provider  acetaminophen (TYLENOL) 325 MG tablet Take 2 tablets (650 mg total) by mouth every 6 (six) hours as needed for fever. 03/20/16  Yes Bonnielee Haff, MD  albuterol (PROVENTIL) (2.5 MG/3ML)  0.083% nebulizer solution Take 3 mLs (2.5 mg total) by nebulization every 6 (six) hours as needed for wheezing or shortness of breath. 12/30/15  Yes Robbie Lis, MD  atorvastatin (LIPITOR) 10 MG tablet Take 10 mg by mouth at bedtime.    Yes Historical Provider, MD  famotidine (PEPCID) 20 MG tablet Take 1 tablet (20 mg total) by mouth 2 (two) times daily. 11/18/15  Yes Cherene Altes, MD  furosemide (LASIX) 40 MG tablet Take 40 mg by mouth 2 (two) times daily.   Yes Historical Provider, MD  gabapentin (NEURONTIN) 100 MG capsule Take 1 capsule (100 mg total) by mouth at bedtime. 11/18/15  Yes Cherene Altes, MD  glipiZIDE (GLUCOTROL) 5 MG tablet Take 5 mg by mouth daily before breakfast.   Yes Historical Provider, MD  lisinopril (PRINIVIL,ZESTRIL) 2.5 MG tablet Take 2.5 mg by mouth daily.  11/30/15  Yes Historical Provider, MD  metFORMIN (GLUCOPHAGE) 500 MG tablet Take 0.5 tablets (250 mg total) by mouth 2 (two) times daily. 04/22/16  Yes Dinah C Ngetich, NP  Multiple Vitamin (MULTIVITAMIN WITH MINERALS) TABS tablet Take 1 tablet by mouth daily. 07/09/15  Yes Florencia Reasons, MD  nystatin cream (MYCOSTATIN) Apply 1 application topically 2 (two) times daily.   Yes Historical Provider, MD  omega-3 acid ethyl esters (LOVAZA) 1 g capsule Take 1 g by mouth daily.   Yes Historical Provider, MD  polyethylene glycol (MIRALAX / GLYCOLAX) packet Take 17 g by mouth 2 (two) times daily. 11/18/15  Yes Cherene Altes, MD  potassium chloride (K-DUR,KLOR-CON) 10 MEQ tablet Take 1 tablet (10 mEq total) by mouth daily. 03/21/16  Yes Bonnielee Haff, MD    Current Facility-Administered Medications  Medication Dose Route Frequency Provider Last Rate Last Dose  . acetaminophen (TYLENOL) tablet 650 mg  650 mg Oral Q4H PRN Shon Millet, DO   650 mg at 07/19/16 1541  . albuterol (PROVENTIL) (2.5 MG/3ML) 0.083% nebulizer solution 2.5 mg  2.5 mg Nebulization Q2H PRN Juanito Doom, MD   2.5 mg at 07/20/16 1042  .  atorvastatin (LIPITOR) tablet 10 mg  10 mg Oral QHS Chesley Mires, MD   10 mg at 07/20/16 2237  . budesonide (PULMICORT) nebulizer solution 0.25 mg  0.25 mg Nebulization BID Barton Dubois, MD   0.25 mg at 07/21/16 0915  . famotidine (PEPCID) tablet 20 mg  20 mg Oral BID Chesley Mires, MD   20 mg at 07/21/16 0846  . feeding supplement (ENSURE ENLIVE) (ENSURE ENLIVE) liquid 237 mL  237 mL Oral BID BM Barton Dubois, MD      . hydrocerin (EUCERIN) cream   Topical BID Juanito Doom, MD      . HYDROcodone-homatropine [facility]) 5-1.5 MG/5ML syrup 5 mL  5 mL Oral Q6H PRN Jani Gravel, MD   5 mL at 07/21/16 0214  . insulin aspart (novoLOG) injection 0-9 Units  0-9 Units Subcutaneous TID WC Shon Millet, DO   3 Units at 07/21/16 1244  . ipratropium-albuterol (DUONEB) 0.5-2.5 (3) MG/3ML nebulizer solution 3 mL  3 mL Nebulization BID Anderson Malta  Chahn-Yang Choi, DO   3 mL at 07/21/16 0907  . ondansetron (ZOFRAN) injection 4 mg  4 mg Intravenous Q6H PRN Juanito Doom, MD      . piperacillin-tazobactam (ZOSYN) IVPB 3.375 g  3.375 g Intravenous Q8H Baltazar Najjar [place], RPH   3.375 g at 07/21/16 1428  . sodium chloride flush (NS) 0.9 % injection 10-40 mL  10-40 mL Intracatheter PRN Rich Fuchs Choi, DO   20 mL at 07/21/16 1331  . vancomycin (VANCOCIN) IVPB 1000 mg/200 mL premix  1,000 mg Intravenous Q12H Shon Millet, DO   1,000 mg at 07/21/16 0559    Allergies as of 07/12/2016  . (No Known Allergies)    Family History  Problem Relation Age of Onset  . Diabetes Mother   . Alzheimer's disease Mother   . Diabetes Father   . Diabetes Sister   . Scoliosis Sister     Social History   Social History  . Marital status: Single    Spouse name: N/A  . Number of children: N/A  . Years of education: N/A   Occupational History  . Not on file.   Social History Main Topics  . Smoking status: Former Smoker    Packs/day: 1.00    Years: 20.00    Types: Cigarettes    Quit date:  11/19/2014  . Smokeless tobacco: Never Used  . Alcohol use 3.0 oz/week    5 Glasses of wine per week  . Drug use: No  . Sexual activity: Not Currently   Other Topics Concern  . 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             Review of Systems:  No GI tract symptoms. No known chronic cardiopulmonary disease or manifestations such as chest pain or shortness of breath. She has chronic skin changes on her lower extremities related to her MS, perhaps chronic lymphedema.  Physical Exam: Vital signs in last 24 hours: Temp:  [99 F (37.2 C)-99.2 F (37.3 C)] 99 F (37.2 C) (02/08 1407) Pulse Rate:  [74-89] 89 (02/08 1407) Resp:  [18-20] 18 (02/08 1407) BP: (121-132)/(63-67) 128/66 (02/08 1407) SpO2:  [98 %-100 %] 100 % (02/08 1407) Weight:  [69.2 kg (152 lb 8.9 oz)] 69.2 kg (152 lb 8.9 oz) (02/08 0955) Last BM Date: 06/19/16 General:   Alert,  NAD, sitting up in bed. Head:  Normocephalic and atraumatic. Eyes:  Sclera clear, no icterus.    Mouth:   No ulcerations or lesions.  Oropharynx pink & moist. Neck:   No masses or thyromegaly. Lungs:  Clear throughout to auscultation.   No wheezes, crackles, or rhonchi. No evident respiratory distress, on [place] O2. Heart:   Regular rate and rhythm; no murmurs, clicks, rubs,  or gallops. Abdomen:  Soft, nontender, nontympanitic, and nondistended. No masses, hepatosplenomegaly or ventral hernias noted.  Msk:   Right arm appears somewhat contractured. Extremities: No frank lower extremity edema. Neurologic: Moderately dysarthric, arm contractured. Detailed testing not performed Skin: Marked hyperpigmented keratosis of distal anterior aspect of lower extremities  Cervical Nodes:  No significant cervical adenopathy. Psych:   Alert and cooperative. Normal mood and affect.  Intake/Output from previous day: 02/07 0701 - 02/08  0700 In: 365 [P.O.:275; I.V.:40; IV Piggyback:50] Out: 2550 [Urine:2550] Intake/Output this shift: Total I/O In: 500 [P.O.:480; I.V.:20] Out: 300 [Urine:300]  Lab Results:  Recent Labs  07/19/16 0426 07/20/16 0414  WBC 12.5* 10.6*  HGB 8.2* 7.9*  HCT 24.8* 25.2*  PLT 443* 447*   BMET  Recent Labs  07/19/16 0426 07/20/16 0414  NA 143 143  K 3.2* 3.7  CL 116* 115*  CO2 24 24  GLUCOSE 88 125*  BUN <5* 5*  CREATININE 0.62 0.86  CALCIUM 8.8* 8.7*   LFT No results for input(s): PROT, ALBUMIN, AST, ALT, ALKPHOS, BILITOT, BILIDIR, IBILI in the last 72 hours. PT/INR No results for input(s): LABPROT, INR in the last 72 hours.  Studies/Results: No results found.  Impression: 1. Heme positive stool on admission 2. Profound anemia, subacute, with a 4 - 5 g drop in hemoglobin in the last 2 months 3. Sepsis on admission, possibly of urinary origin; subsequent development of hospital acquired pneumonia 4. Advanced multiple sclerosis, urinary retention with nephrostomies, limited mobility  Plan:  I definitely feel this patient, who has never had colonoscopy, should have endoscopy and colonoscopy prior to discharge. We will reassess her in a couple of days, with the intent of probably prepping the patient over the weekend and doing endoscopy and colonoscopy under monitored anesthesia care on Monday. I have discussed the nature, purpose, and risks of endoscopy and colonoscopy with the patient and she is agreeable to proceed    LOS: 9 days   Bailee Metter V  07/21/2016, 4:57 PM   Pager [phone] If no answer or after 5 PM call [phone]

## 2016-07-25 NOTE — Progress Notes (Signed)
Pt  Only drank half of the [place] last pm and refused to take even another sip.  She is having liquid stools during the night, they are dark in color but no particles present, just dark and watery. Me

## 2016-07-25 NOTE — Progress Notes (Signed)
[facility] still assisting with patient disposition back to SNF. Facility has been updated.

## 2016-07-25 NOTE — Op Note (Signed)
[facility] Patient Name: [name] Procedure Date: 07/25/2016 MRN: [id number] Attending MD: Nancy Fetter Dr., MD Date of Birth: 1961/04/29 CSN: [id number] Age: 56 Admit Type: Inpatient Procedure:                Upper GI endoscopy Indications:              Heme positive stool Providers:                Jeneen Rinks L. Jamarkis Branam Dr., MD, Hilma Favors, RN,                            William Dalton, Technician Referring MD:              Medicines:                Monitored Anesthesia Care Complications:            No immediate complications. Estimated Blood Loss:     Estimated blood loss: none. Procedure:                Pre-Anesthesia Assessment:                           - Prior to the procedure, a History and Physical                            was performed, and patient medications and                            allergies were reviewed. The patient's tolerance of                            previous anesthesia was also reviewed. The risks                            and benefits of the procedure and the sedation                            options and risks were discussed with the patient.                            All questions were answered, and informed consent                            was obtained. Prior Anticoagulants: The patient has                            taken no previous anticoagulant or antiplatelet                            agents. ASA Grade Assessment: III - A patient with                            severe systemic disease. After reviewing the risks  and benefits, the patient was deemed in                            satisfactory condition to undergo the procedure.                           After obtaining informed consent, the endoscope was                            passed under direct vision. Throughout the                            procedure, the patient's blood pressure, pulse, and                            oxygen  saturations were monitored continuously. The                            [id number]) scope was introduced through the                            mouth, and advanced to the second part of duodenum.                            The upper GI endoscopy was accomplished without                            difficulty. The patient tolerated the procedure                            well. Scope In: Scope Out: Findings:      The esophagus was normal.      The stomach was normal.      The examined duodenum was normal. Impression:               - Normal esophagus.                           - Normal stomach.                           - Normal examined duodenum.                           - No specimens collected.                           - Blood in stool without cause found on endoscopic                            exam Moderate Sedation:      MAC by anesthesia Recommendation:           - Return patient to hospital ward for ongoing care.                           - Continue  present medications.                           - See the other procedure note for documentation of                            additional recommendations.                           - Resume previous diet. Procedure Code(s):        --- Professional ---                           [phone], Esophagogastroduodenoscopy, flexible,                            transoral; diagnostic, including collection of                            specimen(s) by brushing or washing, when performed                            (separate procedure) Diagnosis Code(s):        --- Professional ---                           R19.5, Other fecal abnormalities CPT copyright 2016 American Medical Association. All rights reserved. The codes documented in this report are preliminary and upon coder review may  be revised to meet current compliance requirements. Nancy Fetter Dr., MD 07/25/2016 3:03:52 PM This report has been signed electronically. Number of  Addenda: 0

## 2016-07-25 NOTE — Interval H&P Note (Signed)
History and Physical Interval Note:  07/25/2016 2:11 PM  [name]  has presented today for surgery, with the diagnosis of heme positive stool and anemia  The various methods of treatment have been discussed with the patient and family. After consideration of risks, benefits and other options for treatment, the patient has consented to  Procedure(s): ESOPHAGOGASTRODUODENOSCOPY (EGD) WITH PROPOFOL (N/A) COLONOSCOPY WITH PROPOFOL (N/A) as a surgical intervention .  The patient's history has been reviewed, patient examined, no change in status, stable for surgery.  I have reviewed the patient's chart and labs.  Questions were answered to the patient's satisfaction.     [name],[name]

## 2016-07-25 NOTE — Progress Notes (Signed)
PROGRESS NOTE    [name]  [id number] DOB: 10-02-1960 DOA: 07/12/2016 PCP: Blanchie Serve, MD     Brief Narrative:  [name] is a 56 yo female with past medical history of multiple sclerosis, history of DVT on Xarelto, hx of bilateral obstructing nephrolithiasis s/p bilateral perc nephrostomy tube in July 2017 and December 2017, who presented to the emergency department due to fevers. She was encephalopathic at time of admission. In the emergency department, she had a fever of 103, hypotensive, lactic acidosis, and hemoglobin of 4. She was initially admitted to [facility] services, with presumed sepsis secondary to UTI. She was treated with IV antibiotics, given blood products. She did require pressors for blood pressure support. She was transferred to [facility] with stabilization of BP on 07/15/16.  CT abdomen/pelvis was obtained as patient continued with leukocytosis. This showed multiloculated pelvic fluid collection. General surgery as well as urology were consulted. General surgery did not feel this was related to her bowel and has since signed off. Urology feels this is likely pelvic abscess vs urinoma. They recommend nephrostomy tubes bilaterally (these were in place prior to this admission) as well as foley in order to allow drainage of urine away from the bladder to allow healing. She has been on zosyn.   On evening of 2/5, she started to spike fevers with respiratory complaints. CXR obtained showed LLL infiltrate. Continue IV abx's. GI consulted given positive FOBT, low Hgb on admission and need for anticoagulation.   Assessment & Plan:   Principal Problem:   Septic shock ([place]) Active Problems:   Multiple sclerosis diagnosed 2002-not on Therapy any longer   DVT, lower extremity (HCC)   Chronic indwelling Foley catheter   AKI (acute kidney injury) ([place])   Controlled diabetes mellitus type 2 with complications ([place])   Pelvic abscess in female   Acute metabolic encephalopathy  Anemia of chronic disease   Essential hypertension  Shock, multifactorial: Sepsis -Septic as well as hemorrhagic -Required levophed, her pressure is now stable off pressors -Initially thought source to be secondary to UTI. She was started on IV Vanco and Zosyn, but urine culture has now resulted with multiple organisms, UA does show large leuk, numerous WBC, many bacteria. Previous urine culture has shown pan-sensitive E Coli and Pseudomonas, resistant providencia  -CT renal stone on 1/30 with questionable cystitis, questionable diverticulitis or colitis  -Due to diarrhea, C. difficile PCR was ordered as well as oral vancomycin. However, before sample could be obtained, she stopped having diarrhea. Therefore PCR test, enteric precaution, oral vanco are stopped.  -Blood culture with 1 of 2 coag neg staph, likely contaminant  -Influenza negative  -CT abd/pelvis obtained due to continued leukocytosis, this showed multiloculated pelvic fluid collection  -Urology consulted: likely pelvic abscess vs urinoma. Continue nephrostomy tubes and foley to allow drainage of urine away from bladder for healing. -IR consulted by Urology for potential SP tube placement; plan is to repeat CT abd/pelvis again to reassess abscess and determine further intervention. -will continue narrowed abx's by mouth Augmentin. -Repeat blood culture pending  -also treating HCAP see below -sepsis features resolved. Hgb and VS remains stable.  LLL HCAP -Respiratory complaints started 2/5.  -Re-started vanco along with zosyn; which was given for 3 days. Will now transition to Augmentin to complete therapy -patient afebrile for 72 hours now and with WBC's back to normal. -tolerating good O2 sat on RA now -continue PRN duoneb -will also continue pulmicort and flutter valve   Acute metabolic  encephalopathy -Now resolved and at baseline -most likely associated with fever and acute infection.   Acute on chronic normocytic  anemia -Baseline Hgb 8.2-9.5 -Unclear etiology, had hemoccult positive stool but no report of melena or hematochezia to explain Hgb of 4 on admission  -Received 2u pRBC on 1/30  -Iron studies are not consistent with iron deficiency anemia -INR normal, LDH normal, fibrinogen elevated, Haptoglobin 308. Not consistent with hemolytic anemia or DIC  -Vit B 12: 2,254. Folate: 1,996 -Continue to trend CBC intermitently. Hgb 7.9 (last checked on 2/10) -discussed with GI and will plan for EGD and/or colonoscopy on 07/25/16 -no signs of acute bleeding currently  AKI  -Baseline Cr 0.5-0.7, Cr was 1.75 on admission -Resolved with IVF  -will monitor trend  History of DVT -Xarelto was held on admission due to anemia -Currently on SCDs -will follow Hgb trend and resume anticoagulation at discharge (if safe)  -no signs of acute bleeding currently   DM type 2, well controlled  -continue holding oral home meds  -Ha1c 5.3 -will continue SSI and modified carb diet   Essential HTN -Holding home antihypertensives due to shock on admission, BP currently stable without meds -will monitor and resume antihypertensive agents as needed   Hypokalemia -will replete as needed  -follow BMET to assess trend    DVT prophylaxis: SCDs Code Status: Full Family Communication: no family at bedside Disposition Plan: pending further stabilization, improvement in her breathing. Back to SNF at discharge. Per GI rec's will follow EGD/colonoscopy eval on 07/25/16.   Consultants:   PCCM  General surgery  Urology   GI  IR  Procedures:   None  Antimicrobials:  Anti-infectives    Start     Dose/Rate Route Frequency Ordered Stop   07/22/16 1400  amoxicillin-clavulanate (AUGMENTIN) 875-125 MG per tablet 1 tablet     1 tablet Oral Every 12 hours 07/22/16 1245     07/19/16 0600  vancomycin (VANCOCIN) IVPB 1000 mg/200 mL premix  Status:  Discontinued     1,000 mg 200 mL/hr over 60 Minutes Intravenous Every  12 hours 07/18/16 1810 07/22/16 0500   07/18/16 1815  vancomycin (VANCOCIN) IVPB 1000 mg/200 mL premix     1,000 mg 200 mL/hr over 60 Minutes Intravenous STAT 07/18/16 1810 07/18/16 1940   07/13/16 1400  vancomycin (VANCOCIN) IVPB 1000 mg/200 mL premix  Status:  Discontinued     1,000 mg 200 mL/hr over 60 Minutes Intravenous Every 24 hours 07/12/16 2058 07/14/16 1109   07/13/16 1200  vancomycin (VANCOCIN) IVPB 1000 mg/200 mL premix  Status:  Discontinued     1,000 mg 200 mL/hr over 60 Minutes Intravenous Every 24 hours 07/12/16 1412 07/12/16 2051   07/13/16 0000  vancomycin (VANCOCIN) 50 mg/mL oral solution 500 mg  Status:  Discontinued     500 mg Oral Every 6 hours 07/12/16 2051 07/15/16 1328   07/12/16 2200  piperacillin-tazobactam (ZOSYN) IVPB 3.375 g  Status:  Discontinued     3.375 g 12.5 mL/hr over 240 Minutes Intravenous Every 8 hours 07/12/16 1412 07/12/16 2051   07/12/16 2200  piperacillin-tazobactam (ZOSYN) IVPB 3.375 g  Status:  Discontinued     3.375 g 12.5 mL/hr over 240 Minutes Intravenous Every 8 hours 07/12/16 2058 07/22/16 1245   07/12/16 1315  piperacillin-tazobactam (ZOSYN) IVPB 3.375 g     3.375 g 100 mL/hr over 30 Minutes Intravenous  Once 07/12/16 1314 07/12/16 1356   07/12/16 1315  vancomycin (VANCOCIN) IVPB 1000 mg/200 mL premix  1,000 mg 200 mL/hr over 60 Minutes Intravenous  Once 07/12/16 1314 07/12/16 1512      Subjective: Patient remains afebrile and endorses breathing great. No CP, no abd pain, no nausea, no vomiting. Dark liquid stools reported by staff overnight during bowel prep.   Objective: Vitals:   07/24/16 2029 07/24/16 2054 07/25/16 0430 07/25/16 0900  BP: (!) 158/82  (!) 148/75   Pulse: 84  91   Resp: 17  20   Temp: 97.7 F (36.5 C)  99 F (37.2 C)   TempSrc: Oral  Oral   SpO2: 99% 96% 99% 98%  Weight:      Height:        Intake/Output Summary (Last 24 hours) at 07/25/16 1027 Last data filed at 07/25/16 0916  Gross per 24 hour    Intake             1260 ml  Output             2250 ml  Net             -990 ml   Filed Weights   07/22/16 1850 07/23/16 0438 07/24/16 1230  Weight: 68.3 kg (150 lb 9.2 oz) 68.6 kg (151 lb 3.8 oz) 67.8 kg (149 lb 7.6 oz)   Examination:  General exam: Appears calm and comfortable, report breathing is excellent today and she is feeling ok. Patient has remained afebrile and tolerating antibiotics by mouth. No CP, no nausea, no vomiting. During bowel prep overnight some dark liquid stools where reported by staff. Patient off oxygen supplementation now. Denies abd pain. Respiratory system: fair air movement, rhonchi L > R; positive mild exp wheezing. Respiratory effort normal. Cardiovascular system: S1 & S2 heard, RRR. No JVD, murmurs, rubs, gallops or clicks. No pedal edema. Gastrointestinal system: Abdomen is nondistended, soft,denies tenderness on palpation. No organomegaly or masses felt. Normal bowel sounds heard. Gu: Bilateral nephrostomy tubes present, foley in place  Central nervous system: Alert and oriented. CN intact, following commands properly. Patient with chronic motor deficits from MS. Extremities: no swelling, no cyanosis  Skin: No acute rashes or bruises, patient with +chronic bilateral feet with venous stasis lesions/skin changes  Psychiatry: Judgement and insight appear normal. Mood & affect appropriate.   Data Reviewed: I have personally reviewed following labs and imaging studies  CBC:  Recent Labs Lab 07/19/16 0426 07/20/16 0414 07/23/16 0905  WBC 12.5* 10.6* 14.0*  NEUTROABS 9.0* 6.9  --   HGB 8.2* 7.9* 7.9*  HCT 24.8* 25.2* 24.6*  MCV 88.3 87.8 89.5  PLT 443* 447* [id number]*   Basic Metabolic Panel:  Recent Labs Lab 07/19/16 0426 07/20/16 0414 07/22/16 0353  NA 143 143 140  K 3.2* 3.7 3.6  CL 116* 115* 110  CO2 24 24 26   GLUCOSE 88 125* 148*  BUN <5* 5* 7  CREATININE 0.62 0.86 0.72  CALCIUM 8.8* 8.7* 8.8*   GFR: Estimated Creatinine Clearance: 69.8  mL/min (by C-G formula based on SCr of 0.72 mg/dL).   CBG:  Recent Labs Lab 07/24/16 0809 07/24/16 1232 07/24/16 1745 07/24/16 2125 07/25/16 0727  GLUCAP 101* 242* 107* 101* 120*   Sepsis Labs: No results for input(s): PROCALCITON, LATICACIDVEN in the last 168 hours.  Recent Results (from the past 240 hour(s))  Culture, blood (routine x 2)     Status: None   Collection Time: 07/19/16  8:02 AM  Result Value Ref Range Status   Specimen Description BLOOD RIGHT HAND  Final  Special Requests BOTTLES DRAWN AEROBIC ONLY 5CC  Final   Culture   Final    NO GROWTH 5 DAYS Performed at [facility], [place] [address], [place], [place] [address]    Report Status 07/24/2016 FINAL  Final  Culture, blood (routine x 2)     Status: None   Collection Time: 07/19/16  8:04 AM  Result Value Ref Range Status   Specimen Description BLOOD LEFT HAND  Final   Special Requests IN PEDIATRIC BOTTLE 3CC  Final   Culture   Final    NO GROWTH 5 DAYS Performed at [facility], [place] [address], [place], [place] [address]    Report Status 07/24/2016 FINAL  Final     Radiology Studies: No results found.   Scheduled Meds: . amoxicillin-clavulanate  1 tablet Oral Q12H  . atorvastatin  10 mg Oral QHS  . budesonide (PULMICORT) nebulizer solution  0.25 mg Nebulization BID  . famotidine  20 mg Oral BID  . feeding supplement (ENSURE ENLIVE)  237 mL Oral BID BM  . hydrocerin   Topical BID  . insulin aspart  0-9 Units Subcutaneous TID WC  . ipratropium-albuterol  3 mL Nebulization BID   Continuous Infusions:    LOS: 13 days    Time spent: 25 minutes   Barton Dubois, MD [phone] [facility] [email or web address] Password [facility] 07/25/2016, 10:27 AM

## 2016-07-25 NOTE — Op Note (Signed)
[facility] Patient Name: [name] Procedure Date: 07/25/2016 MRN: [id number] Attending MD: Nancy Fetter Dr., MD Date of Birth: May 13, 1961 CSN: [id number] Age: 56 Admit Type: Inpatient Procedure:                Colonoscopy Indications:              Heme positive stool, Iron deficiency anemia Providers:                Jeneen Rinks L. Estiben Mizuno Dr., MD, Hilma Favors, RN,                            William Dalton, Technician Referring MD:              Medicines:                Monitored Anesthesia Care Complications:            No immediate complications. Estimated Blood Loss:     Estimated blood loss: none. Procedure:                Pre-Anesthesia Assessment:                           - Prior to the procedure, a History and Physical                            was performed, and patient medications and                            allergies were reviewed. The patient's tolerance of                            previous anesthesia was also reviewed. The risks                            and benefits of the procedure and the sedation                            options and risks were discussed with the patient.                            All questions were answered, and informed consent                            was obtained. Prior Anticoagulants: The patient has                            taken no previous anticoagulant or antiplatelet                            agents. ASA Grade Assessment: III - A patient with                            severe systemic disease. After reviewing the risks  and benefits, the patient was deemed in                            satisfactory condition to undergo the procedure.                           After obtaining informed consent, the colonoscope                            was passed under direct vision. Advanced to the ICV                            Throughout the procedure, the patient's blood    pressure, pulse, and oxygen saturations were                            monitored continuously. The colonoscopy was                            performed with moderate difficulty due to                            unsatisfactory bowel prep and significant looping.                            Successful completion of the procedure was aided by                            changing the patient to a supine position and using                            manual pressure. The patient tolerated the                            procedure fairly well. The quality of the bowel                            preparation was unsatisfactory. The EC-[id number]                            ([id number]) scope was introduced through the anus and                            advanced to the the ileocecal valve. The ileocecal                            valve and the rectum were photographed. The quality                            of the bowel preparation was unsatisfactory. Scope In: 2:26:24 PM Scope Out: 2:57:54 PM Scope Withdrawal Time: 0 hours 7 minutes 33 seconds  Total Procedure Duration: 0 hours 31 minutes 30 seconds  Findings:      The perianal and digital rectal examinations were normal.  There is no endoscopic evidence of bleeding, diverticula, mass, polyps       or tumor in the entire colon. Impression:               - Preparation of the colon was unsatisfactory.                           - No specimens collected.                           - Anemia-no etiology evident on endoscopic                            evaluation                           - Blood in stool without cause found on endoscopic                            exam. Due to the prep a small polyp or AVM could                            have been missed. Moderate Sedation:      MAC by anesthesia Recommendation:           - Return patient to hospital ward for ongoing care.                           - Full liquid diet.                           -  Continue present medications.                           - No repeat colonoscopy. Procedure Code(s):        --- Professional ---                           [phone], Colonoscopy, flexible; diagnostic, including                            collection of specimen(s) by brushing or washing,                            when performed (separate procedure) Diagnosis Code(s):        --- Professional ---                           R19.5, Other fecal abnormalities                           D50.9, Iron deficiency anemia, unspecified CPT copyright 2016 American Medical Association. All rights reserved. The codes documented in this report are preliminary and upon coder review may  be revised to meet current compliance requirements. Nancy Fetter Dr., MD 07/25/2016 3:12:38 PM This report has been signed electronically. Number of Addenda: 0

## 2016-07-25 NOTE — Anesthesia Preprocedure Evaluation (Addendum)
Anesthesia Evaluation  Patient identified by MRN, date of birth, ID band Patient awake    Reviewed: Allergy & Precautions, NPO status , Patient's Chart, lab work & pertinent test results  Airway Mallampati: II  TM Distance: >3 FB Neck ROM: Full    Dental no notable dental hx.    Pulmonary neg pulmonary ROS, former smoker,    Pulmonary exam normal breath sounds clear to auscultation       Cardiovascular hypertension, + DVT  Normal cardiovascular exam Rhythm:Regular Rate:Normal     Neuro/Psych MS  Neuromuscular disease CVA negative psych ROS   GI/Hepatic negative GI ROS, Neg liver ROS,   Endo/Other  diabetes  Renal/GU Renal InsufficiencyRenal disease  negative genitourinary   Musculoskeletal negative musculoskeletal ROS (+)   Abdominal   Peds negative pediatric ROS (+)  Hematology  (+) anemia ,   Anesthesia Other Findings   Reproductive/Obstetrics negative OB ROS                             Anesthesia Physical Anesthesia Plan  ASA: III  Anesthesia Plan: MAC   Post-op Pain Management:    Induction: Intravenous  Airway Management Planned: Nasal Cannula  Additional Equipment:   Intra-op Plan:   Post-operative Plan:   Informed Consent: I have reviewed the patients History and Physical, chart, labs and discussed the procedure including the risks, benefits and alternatives for the proposed anesthesia with the patient or authorized representative who has indicated his/her understanding and acceptance.   Dental advisory given  Plan Discussed with: CRNA and Surgeon  Anesthesia Plan Comments:         Anesthesia Quick Evaluation

## 2016-07-26 ENCOUNTER — Inpatient Hospital Stay (HOSPITAL_COMMUNITY): Payer: Medicare Other

## 2016-07-26 DIAGNOSIS — G92 Toxic encephalopathy: Secondary | ICD-10-CM

## 2016-07-26 LAB — CBC
HEMATOCRIT: 23.9 % — AB (ref 36.0–46.0)
Hemoglobin: 7.6 g/dL — ABNORMAL LOW (ref 12.0–15.0)
MCH: 27.9 pg (ref 26.0–34.0)
MCHC: 31.8 g/dL (ref 30.0–36.0)
MCV: 87.9 fL (ref 78.0–100.0)
PLATELETS: 390 10*3/uL (ref 150–400)
RBC: 2.72 MIL/uL — ABNORMAL LOW (ref 3.87–5.11)
RDW: 17.1 % — AB (ref 11.5–15.5)
WBC: 10.8 10*3/uL — AB (ref 4.0–10.5)

## 2016-07-26 LAB — GLUCOSE, CAPILLARY
GLUCOSE-CAPILLARY: 157 mg/dL — AB (ref 65–99)
GLUCOSE-CAPILLARY: 139 mg/dL — AB (ref 65–99)
GLUCOSE-CAPILLARY: 168 mg/dL — AB (ref 65–99)
Glucose-Capillary: 106 mg/dL — ABNORMAL HIGH (ref 65–99)
Glucose-Capillary: 125 mg/dL — ABNORMAL HIGH (ref 65–99)

## 2016-07-26 LAB — BASIC METABOLIC PANEL
Anion gap: 4 — ABNORMAL LOW (ref 5–15)
BUN: 8 mg/dL (ref 6–20)
CALCIUM: 9.4 mg/dL (ref 8.9–10.3)
CO2: 26 mmol/L (ref 22–32)
CREATININE: 0.65 mg/dL (ref 0.44–1.00)
Chloride: 113 mmol/L — ABNORMAL HIGH (ref 101–111)
GFR calc Af Amer: >60 mL/min (ref >60)
GLUCOSE: 106 mg/dL — AB (ref 65–99)
Potassium: 3.2 mmol/L — ABNORMAL LOW (ref 3.5–5.1)
Sodium: 143 mmol/L (ref 135–145)

## 2016-07-26 LAB — PROTIME-INR
INR: 1.15
Prothrombin Time: 14.7 seconds (ref 11.4–15.2)

## 2016-07-26 MED ORDER — IOPAMIDOL (ISOVUE-300) INJECTION 61%
100.0000 mL | Freq: Once | INTRAVENOUS | Status: AC | PRN
  Administered 2016-07-26: 100 mL via INTRAVENOUS

## 2016-07-26 MED ORDER — SODIUM CHLORIDE 0.9 % IJ SOLN
INTRAMUSCULAR | Status: AC
  Filled 2016-07-26: qty 50

## 2016-07-26 MED ORDER — SODIUM CHLORIDE 0.9 % IV SOLN
510.0000 mg | Freq: Once | INTRAVENOUS | Status: AC
  Administered 2016-07-26: 510 mg via INTRAVENOUS
  Filled 2016-07-26: qty 17

## 2016-07-26 MED ORDER — IOPAMIDOL (ISOVUE-300) INJECTION 61%
INTRAVENOUS | Status: AC
  Filled 2016-07-26: qty 100

## 2016-07-26 NOTE — Care Management Important Message (Signed)
Important Message  Patient Details  Name: [name] MRN: [id number] Date of Birth: [birth year]   Medicare Important Message Given:  Yes    Kerin Salen 07/26/2016, 12:25 [place] Message  Patient Details  Name: [name] MRN: [id number] Date of Birth: [birth year]   Medicare Important Message Given:  Yes    Kerin Salen 07/26/2016, 12:25 PM

## 2016-07-26 NOTE — Progress Notes (Signed)
[facility] updated SNF facility.  Will continue to assist with disposition.

## 2016-07-26 NOTE — Progress Notes (Signed)
MEDICATION RELATED CONSULT NOTE - INITIAL   Pharmacy Consult for IV iron Indication: Anemia  No Known Allergies  Patient Measurements: Height: 5\' 5"  (165.1 cm) Weight: 149 lb (67.6 kg) IBW/kg (Calculated) : 57   Vital Signs: Temp: 99 F (37.2 C) (02/13 1404) Temp Source: Oral (02/13 1404) BP: 146/71 (02/13 1404) Pulse Rate: 85 (02/13 1404) Intake/Output from previous day: 02/12 0701 - 02/13 0700 In: 1710 [P.O.:480; I.V.:510] Out: 1400 [Urine:1400] Intake/Output from this shift: Total I/O In: 820 [P.O.:120; Other:700] Out: 280 [Urine:280]  Labs:  Recent Labs  07/26/16 0519  WBC 10.8*  HGB 7.6*  HCT 23.9*  PLT 390  CREATININE 0.65   Estimated Creatinine Clearance: 69.8 mL/min (by C-G formula based on SCr of 0.65 mg/dL).   Microbiology: Recent Results (from the past 720 hour(s))  Culture, blood (Routine x 2)     Status: Abnormal   Collection Time: 07/12/16  1:13 PM  Result Value Ref Range Status   Specimen Description BLOOD LEFT ANTECUBITAL  Final   Special Requests BOTTLES DRAWN AEROBIC AND ANAEROBIC 5CC  Final   Culture  Setup Time   Final    GRAM POSITIVE COCCI IN CLUSTERS ANAEROBIC BOTTLE ONLY CRITICAL RESULT CALLED TO, READ BACK BY AND VERIFIED WITH: C. Shade Pharm.D. 15:15 07/13/16 ([email or web address])    Culture (A)  Final    STAPHYLOCOCCUS SPECIES (COAGULASE NEGATIVE) THE SIGNIFICANCE OF ISOLATING THIS ORGANISM FROM A SINGLE SET OF BLOOD CULTURES WHEN MULTIPLE SETS ARE DRAWN IS UNCERTAIN. PLEASE NOTIFY THE MICROBIOLOGY DEPARTMENT WITHIN ONE WEEK IF SPECIATION AND SENSITIVITIES ARE REQUIRED. Performed at [facility], [place] [address], [place], [place] [address]    Report Status 07/16/2016 FINAL  Final  Culture, blood (Routine x 2)     Status: None   Collection Time: 07/12/16  1:13 PM  Result Value Ref Range Status   Specimen Description BLOOD LEFT ANTECUBITAL  Final   Special Requests BOTTLES DRAWN AEROBIC AND ANAEROBIC 5CC  Final   Culture   Final   NO GROWTH 5 DAYS Performed at [facility], [place] [address] [place], [place] [address]    Report Status 07/17/2016 FINAL  Final  Blood Culture ID Panel (Reflexed)     Status: Abnormal   Collection Time: 07/12/16  1:13 PM  Result Value Ref Range Status   Enterococcus species NOT DETECTED NOT DETECTED Final   Listeria monocytogenes NOT DETECTED NOT DETECTED Final   Staphylococcus species DETECTED (A) NOT DETECTED Final    Comment: Methicillin (oxacillin) susceptible coagulase negative staphylococcus. Possible blood culture contaminant (unless isolated from more than one blood culture draw or clinical case suggests pathogenicity). No antibiotic treatment is indicated for blood  culture contaminants. CRITICAL RESULT CALLED TO, READ BACK BY AND VERIFIED WITH: C. Shade Pharm.D. 15:15 07/13/16 ([email or web address])    Staphylococcus aureus NOT DETECTED NOT DETECTED Final   Methicillin resistance NOT DETECTED NOT DETECTED Final   Streptococcus species NOT DETECTED NOT DETECTED Final   Streptococcus agalactiae NOT DETECTED NOT DETECTED Final   Streptococcus pneumoniae NOT DETECTED NOT DETECTED Final   Streptococcus pyogenes NOT DETECTED NOT DETECTED Final   Acinetobacter baumannii NOT DETECTED NOT DETECTED Final   Enterobacteriaceae species NOT DETECTED NOT DETECTED Final   Enterobacter cloacae complex NOT DETECTED NOT DETECTED Final   Escherichia coli NOT DETECTED NOT DETECTED Final   Klebsiella oxytoca NOT DETECTED NOT DETECTED Final   Klebsiella pneumoniae NOT DETECTED NOT DETECTED Final   Proteus species NOT DETECTED NOT DETECTED Final  Serratia marcescens NOT DETECTED NOT DETECTED Final   Haemophilus influenzae NOT DETECTED NOT DETECTED Final   Neisseria meningitidis NOT DETECTED NOT DETECTED Final   Pseudomonas aeruginosa NOT DETECTED NOT DETECTED Final   Candida albicans NOT DETECTED NOT DETECTED Final   Candida glabrata NOT DETECTED NOT DETECTED Final   Candida krusei NOT DETECTED NOT  DETECTED Final   Candida parapsilosis NOT DETECTED NOT DETECTED Final   Candida tropicalis NOT DETECTED NOT DETECTED Final    Comment: Performed at [facility], [place] [address], [place], [place] [address]  Urine culture     Status: Abnormal   Collection Time: 07/12/16  5:33 PM  Result Value Ref Range Status   Specimen Description OTHER  Final   Special Requests NONE  Final   Culture MULTIPLE SPECIES PRESENT, SUGGEST RECOLLECTION (A)  Final   Report Status 07/14/2016 FINAL  Final  MRSA PCR Screening     Status: None   Collection Time: 07/12/16  8:53 PM  Result Value Ref Range Status   MRSA by PCR NEGATIVE NEGATIVE Final    Comment:        The GeneXpert MRSA Assay (FDA approved for NASAL specimens only), is one component of a comprehensive MRSA colonization surveillance program. It is not intended to diagnose MRSA infection nor to guide or monitor treatment for MRSA infections.   Culture, blood (routine x 2)     Status: None   Collection Time: 07/19/16  8:02 AM  Result Value Ref Range Status   Specimen Description BLOOD RIGHT HAND  Final   Special Requests BOTTLES DRAWN AEROBIC ONLY 5CC  Final   Culture   Final    NO GROWTH 5 DAYS Performed at [facility], [address], [place], [place] [address]    Report Status 07/24/2016 FINAL  Final  Culture, blood (routine x 2)     Status: None   Collection Time: 07/19/16  8:04 AM  Result Value Ref Range Status   Specimen Description BLOOD LEFT HAND  Final   Special Requests IN PEDIATRIC BOTTLE 3CC  Final   Culture   Final    NO GROWTH 5 DAYS Performed at [facility], [place] [address], [place], [place] [address]    Report Status 07/24/2016 FINAL  Final    Medical History: Past Medical History:  Diagnosis Date  . Acute renal failure superimposed on stage 3 chronic kidney disease ([place]) 07/21/2015  . Anemia    chronic   . Diabetes mellitus without complication ([place])   . Diabetic neuropathy ([place])   . DVT (deep  venous thrombosis) ([place])   . DVT (deep venous thrombosis) ([place])   . GERD (gastroesophageal reflux disease)   . Hx of sepsis   . Hypertension   . Left nephrolithiasis   . Leukocytosis   . Lymph edema    chronic   . Malnutrition of moderate degree 07/21/2015  . Multiple sclerosis diagnosed 2002-not on Therapy any longer 06/26/2012  . Muscle weakness (generalized)   . Neuromuscular disorder ([place])   . Neuromuscular dysfunction of bladder   . Polyneuropathy ([place])   . Presence of indwelling urinary catheter   . Protein calorie malnutrition ([place])   . Stage 4 decubitus ulcer ([place]) 07/05/2015  . Stroke ([place])   . UTI (lower urinary tract infection)     Assessment:  56 yr female admitted on 07/21/16 for presumed sepsis due to UTI and known to pharmacy from antibiotic dosing.  Patient with marked  anemia with positive FOBT  Pharmacy consulted to dose IV iron  Plan:   Feraheme 510mg  IV x 1 dose  May order a second dose in 3-8 days if needed  F/U plans for need for second dose  Deryl Ports, Toribio Harbour, PharmD 07/26/2016,5:39 PM

## 2016-07-26 NOTE — Progress Notes (Signed)
Referring Physician(s): Dahlstedt,S  Supervising Physician: Markus Daft  Patient Status:  [facility] - In-pt  Chief Complaint: Possible urinoma  Subjective: Pt familiar to IR service from prior nephrostomies (left on 12/25/15) and right on 05/13/16. She has a history of MS with neurogenic bladder and hx of urosepsis/bilat hydronephrosis with nephrolithiasis. She also has hx pelvic fluid collections anterior to bladder concerning for abscesses,?of diverticular origin.  She has had pelvic fluid collections in the past with discussion of whether they were of GI or GU origin. CT cystogram on 03/15/2016 showed extra luminal leakage of contrast along the bladder dome which communicates with the measured 3.2 cm pelvic fluid collection. A cystoscopy soon after this by Dr. Diona Fanti showed an inflammatory process at the dome but no clear evidence of fistula. 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 She has been previously scheduled for left PCNL but this has been deferred to multiple admissions. Her nephrostomy tubes were last exchanged on 07/07/16. Her right nephrostomy tube had been capped due to no obstruction seen on nephrostogram but is again to gravity bag now. Recent EGD/colonoscopy unremarkable. Latest CT today shows:  Bilateral nephrostomy tubes are in grossly good position. Urinary bladder is decompressed secondary to Foley catheter.  Large nonobstructive calculi are noted in the upper pole collecting system of left kidney and left renal pelvis.  Moderate left hydronephrosis is noted secondary to 16 x 7 mm calculus in distal left ureter.  Stable large fibroid uterus.  Left retroperitoneal adenopathy is again noted, but improved compared to prior exam.  Fluid collections seen anteriorly in the pelvis on the prior exam appear to have nearly  completely resolved.  Request now received from urology for SP tube placement to redirect cath tip away from dome , decompress bladder and serve as a less irritating drain than traditional foley. Past Medical History:  Diagnosis Date  . Acute renal failure superimposed on stage 3 chronic kidney disease ([place]) 07/21/2015  . Anemia    chronic   . Diabetes mellitus without complication ([place])   . Diabetic neuropathy ([place])   . DVT (deep venous thrombosis) ([place])   . DVT (deep venous thrombosis) ([place])   . GERD (gastroesophageal reflux disease)   . Hx of sepsis   . Hypertension   . Left nephrolithiasis   . Leukocytosis   . Lymph edema    chronic   . Malnutrition of moderate degree 07/21/2015  . Multiple sclerosis diagnosed 2002-not on Therapy any longer 06/26/2012  . Muscle weakness (generalized)   . Neuromuscular disorder ([place])   . Neuromuscular dysfunction of bladder   . Polyneuropathy ([place])   . Presence of indwelling urinary catheter   . Protein calorie malnutrition ([place])   . Stage 4 decubitus ulcer ([place]) 07/05/2015  . Stroke ([place])   . UTI (lower urinary tract infection)    Past Surgical History:  Procedure Laterality Date  . ESOPHAGOGASTRODUODENOSCOPY Left 01/02/2015   Procedure: ESOPHAGOGASTRODUODENOSCOPY (EGD);  Surgeon: Arta Silence, MD;  Location: [facility];  Service: Endoscopy;  Laterality: Left;  . HERNIA MESH REMOVAL    . IR GENERIC HISTORICAL  02/23/2016   IR NEPHROSTOMY EXCHANGE LEFT 02/23/2016 Sandi Mariscal, MD [facility]  . IR GENERIC HISTORICAL  04/19/2016   IR NEPHROSTOMY EXCHANGE LEFT 04/19/2016 Arne Cleveland, MD [facility]  . IR GENERIC HISTORICAL  05/13/2016   IR NEPHROSTOMY EXCHANGE LEFT 05/13/2016  Corrie Mckusick, DO [facility]  . IR GENERIC HISTORICAL  05/13/2016   IR NEPHROSTOMY PLACEMENT RIGHT 05/13/2016 Corrie Mckusick, DO [facility]  . IR GENERIC HISTORICAL  07/07/2016   IR NEPHROSTOMY EXCHANGE RIGHT 07/07/2016 Jacqulynn Cadet, MD [facility]  . IR GENERIC  HISTORICAL  07/07/2016   IR NEPHROSTOMY EXCHANGE LEFT 07/07/2016 Jacqulynn Cadet, MD [facility]  . NEPHROSTOMY TUBE PLACEMENT ([place] HX)    . TRANSURETHRAL RESECTION OF BLADDER TUMOR N/A 03/16/2016   Procedure: CYSTOSCOPY BLADDER BIOPSY;  Surgeon: Franchot Gallo, MD;  Location: [facility];  Service: Urology;  Laterality: N/A;  . TUBAL LIGATION    . tubes tided       Allergies: Patient has no known allergies.  Medications: Prior to Admission medications   Medication Sig Start Date End Date Taking? Authorizing Provider  acetaminophen (TYLENOL) 325 MG tablet Take 2 tablets (650 mg total) by mouth every 6 (six) hours as needed for fever. 03/20/16  Yes Bonnielee Haff, MD  albuterol (PROVENTIL) (2.5 MG/3ML) 0.083% nebulizer solution Take 3 mLs (2.5 mg total) by nebulization every 6 (six) hours as needed for wheezing or shortness of breath. 12/30/15  Yes Robbie Lis, MD  atorvastatin (LIPITOR) 10 MG tablet Take 10 mg by mouth at bedtime.    Yes Historical Provider, MD  famotidine (PEPCID) 20 MG tablet Take 1 tablet (20 mg total) by mouth 2 (two) times daily. 11/18/15  Yes Cherene Altes, MD  furosemide (LASIX) 40 MG tablet Take 40 mg by mouth 2 (two) times daily.   Yes Historical Provider, MD  gabapentin (NEURONTIN) 100 MG capsule Take 1 capsule (100 mg total) by mouth at bedtime. 11/18/15  Yes Cherene Altes, MD  glipiZIDE (GLUCOTROL) 5 MG tablet Take 5 mg by mouth daily before breakfast.   Yes Historical Provider, MD  lisinopril (PRINIVIL,ZESTRIL) 2.5 MG tablet Take 2.5 mg by mouth daily.  11/30/15  Yes Historical Provider, MD  metFORMIN (GLUCOPHAGE) 500 MG tablet Take 0.5 tablets (250 mg total) by mouth 2 (two) times daily. 04/22/16  Yes Dinah C Ngetich, NP  Multiple Vitamin (MULTIVITAMIN WITH MINERALS) TABS tablet Take 1 tablet by mouth daily. 07/09/15  Yes Florencia Reasons, MD  nystatin cream (MYCOSTATIN) Apply 1 application topically 2 (two) times daily.   Yes Historical Provider, MD  omega-3 acid  ethyl esters (LOVAZA) 1 g capsule Take 1 g by mouth daily.   Yes Historical Provider, MD  polyethylene glycol (MIRALAX / GLYCOLAX) packet Take 17 g by mouth 2 (two) times daily. 11/18/15  Yes Cherene Altes, MD  potassium chloride (K-DUR,KLOR-CON) 10 MEQ tablet Take 1 tablet (10 mEq total) by mouth daily. 03/21/16  Yes Bonnielee Haff, MD     Vital Signs: BP (!) 144/77 (BP Location: Left Arm)   Pulse 79   Temp 98.7 F (37.1 C) (Oral)   Resp 18   Ht 5\' 5"  (1.651 m)   Wt 149 lb (67.6 kg)   SpO2 95%   BMI 24.79 kg/m   Physical Exam awake/alert; chest-  Sl dim BS left base, right clear; heart- RRR; abd soft,+BS,NT; R/L PCN's intact, draining yellow urine; no pretibial edema; venous stasis changes noted  Imaging: Ct Abdomen Pelvis W Contrast  Result Date: 07/26/2016 CLINICAL DATA:  Pelvic fluid collections, fever. EXAM: CT ABDOMEN AND PELVIS WITH CONTRAST TECHNIQUE: Multidetector CT imaging of the abdomen and pelvis was performed using the standard protocol following bolus administration of intravenous contrast. CONTRAST:  122mL ISOVUE-300 IOPAMIDOL (ISOVUE-300) INJECTION 61% COMPARISON:  CT scan of July 15, 2016. FINDINGS: Lower chest: Minimal bilateral pleural effusions are noted with adjacent subsegmental atelectasis. Hepatobiliary: No gallstones are noted. Small hepatic cysts are noted. Pancreas: Unremarkable. No pancreatic ductal dilatation or surrounding inflammatory changes. Spleen: Normal in size without focal abnormality. Adrenals/Urinary Tract: Adrenal glands appear normal. Bilateral nephrostomy catheters are noted in grossly good position. 18 mm nonobstructive calculus is noted in upper pole collecting system of left kidney. 17 mm calculus is noted in left renal pelvis. Moderate left hydronephrosis is noted secondary to 16 x 7 mm calculus in distal left ureter. Urinary bladder is decompressed secondary to Foley catheter. Stomach/Bowel: There is no evidence of bowel obstruction. The  appendix appears normal. Vascular/Lymphatic: Left retroperitoneal lymph nodes are again noted which are improved compared to prior exam, with the largest measuring 9 mm. Reproductive: Stable large fibroid uterus is noted. Ovaries are unremarkable. Other: The fluid collections seen anteriorly in the pelvis on the prior exam appear to have nearly completely resolved. Small residual fluid collection measuring 4 mm is noted anteriorly in the left. Musculoskeletal: No acute or significant osseous findings. IMPRESSION: Bilateral nephrostomy tubes are in grossly good position. Urinary bladder is decompressed secondary to Foley catheter. Large nonobstructive calculi are noted in the upper pole collecting system of left kidney and left renal pelvis. Moderate left hydronephrosis is noted secondary to 16 x 7 mm calculus in distal left ureter. Stable large fibroid uterus. Left retroperitoneal adenopathy is again noted, but improved compared to prior exam. Fluid collections seen anteriorly in the pelvis on the prior exam appear to have nearly completely resolved. Electronically Signed   By: Marijo Conception, M.D.   On: 07/26/2016 11:22    Labs:  CBC:  Recent Labs  07/19/16 0426 07/20/16 0414 07/23/16 0905 07/26/16 0519  WBC 12.5* 10.6* 14.0* 10.8*  HGB 8.2* 7.9* 7.9* 7.6*  HCT 24.8* 25.2* 24.6* 23.9*  PLT 443* 447* 425* 390    COAGS:  Recent Labs  03/08/16 2119 03/09/16 0720 03/09/16 1401 03/10/16 0740  05/13/16 1036 07/12/16 1239 07/15/16 0845 07/26/16 0519  INR 1.68  --   --   --   < > 1.18 5.27* 1.16 1.15  APTT 45* 47* 51* 39*  --   --   --   --   --   < > = values in this interval not displayed.  BMP:  Recent Labs  07/19/16 0426 07/20/16 0414 07/22/16 0353 07/26/16 0519  NA 143 143 140 143  K 3.2* 3.7 3.6 3.2*  CL 116* 115* 110 113*  CO2 24 24 26 26   GLUCOSE 88 125* 148* 106*  BUN <5* 5* 7 8  CALCIUM 8.8* 8.7* 8.8* 9.4  CREATININE 0.62 0.86 0.72 0.65  GFRNONAA >60 >60 >60 >60   GFRAA >60 >60 >60 >60    LIVER FUNCTION TESTS:  Recent Labs  03/08/16 1240  05/05/16 0032 05/23/16 07/12/16 1239 07/16/16 0749  BILITOT 0.6  --  1.3*  --  1.3* 0.6  AST 21  < > 37 10* 32 30  ALT 13*  < > 17 13 11* 47  ALKPHOS 40  < > 37* 57 41 136*  PROT 7.7  --  5.3*  --  5.2* 6.0*  ALBUMIN 3.2*  --  2.2*  --  2.0* 1.9*  < > = values in this interval not displayed.  Assessment and Plan: Pt with hx MS, neurogenic bladder, prior R/L PCN's 2017 for obstructive uropathy/nephrolithiasis, prior pelvic fluid collections felt  secondary to possible bladder rupture; chronic Foley cath; latest CT shows near resolution of pelvic collections. Request received from Dr. Diona Fanti for suprapubic cath placement to redirect cath tip away from dome , decompress bladder and serve as a less irritating drain than traditional foley.Details/risks of procedure, incl but not limited to, internal bleeding, infection, injury to adjacent structures d/w pt with her understanding and consent. Procedure tent planned for 2/14.    Electronically Signed: D. Rowe Robert 07/26/2016, 1:54 PM   I spent a total of 25 minutes at the the patient's bedside AND on the patient's hospital floor or unit, greater than 50% of which was counseling/coordinating care for image guided suprapubic cath placement    Patient ID: [name], female   DOB: 1961-03-16, 56 y.o.   MRN: [id number]

## 2016-07-26 NOTE — Progress Notes (Signed)
[facility] PROGRESS NOTE Subjective patient without gross bleeding. She had traced positive stools. Her iron level was quite low at 9 with 7% saturation.  Objective: Vital signs in last 24 hours: Temp:  [98.6 F (37 C)-99.6 F (37.6 C)] 98.7 F (37.1 C) (02/13 0507) Pulse Rate:  [79-92] 79 (02/13 0507) Resp:  [18-21] 18 (02/13 0507) BP: (144-153)/(70-90) 144/77 (02/13 0507) SpO2:  [95 %-97 %] 95 % (02/13 0802) Weight:  [67.6 kg (149 lb)] 67.6 kg (149 lb) (02/12 1326) Last BM Date: 07/24/16  Intake/Output from previous day: 02/12 0701 - 02/13 0700 In: 1710 [P.O.:480; I.V.:510] Out: 1400 [Urine:1400] Intake/Output this shift: Total I/O In: 450 [Other:450] Out: -     Lab Results:  Recent Labs  07/26/16 0519  WBC 10.8*  HGB 7.6*  HCT 23.9*  PLT 390   BMET  Recent Labs  07/26/16 0519  NA 143  K 3.2*  CL 113*  CO2 26  CREATININE 0.65   LFT No results for input(s): PROT, AST, ALT, ALKPHOS, BILITOT, BILIDIR, IBILI in the last 72 hours. PT/INR  Recent Labs  07/26/16 0519  LABPROT 14.7  INR 1.15   PANCREAS No results for input(s): LIPASE in the last 72 hours.       Studies/Results: No results found.  Medications: I have reviewed the patient's current medications.  Assessment/Plan: 1. Marked anemia with positive stools. No gross bleeding lesion seen on EGD or colonoscopy. At this point I would give her several doses of IV iron and start her on oral iron. Which suggests follow-up on a regular basis with her hemoglobin and iron level. This continues to be a problem she will need small bowel capsule endoscopy to look for small bowel lesion and close monitoring of her stool for positivity. The resumption of anticoagulation should be based on stability of the hemoglobin and lack of gross G.I. Bleeding. Please call us if she has any further problems with bleeding.   Osie Merkin JR,Jamara Vary L 07/26/2016, 10:05 AM  This note was created using voice  recognition software. Minor errors may Have occurred unintentionally.  Pager: [phone] If no answer or after hours call [phone]

## 2016-07-26 NOTE — Progress Notes (Signed)
PROGRESS NOTE    [name]  [id number] DOB: 1961/01/24 DOA: 07/12/2016 PCP: Blanchie Serve, MD     Brief Narrative:  [name] is a 56 yo female with past medical history of multiple sclerosis, history of DVT on Xarelto, hx of bilateral obstructing nephrolithiasis s/p bilateral perc nephrostomy tube in July 2017 and December 2017, who presented to the emergency department due to fevers. She was encephalopathic at time of admission. In the emergency department, she had a fever of 103, hypotensive, lactic acidosis, and hemoglobin of 4. She was initially admitted to [facility] services, with presumed sepsis secondary to UTI. She was treated with IV antibiotics, given blood products. She did require pressors for blood pressure support. [facility] with [facility] of BP on 07/15/16.  CT abdomen/pelvis was obtained as patient continued with leukocytosis. This showed multiloculated pelvic fluid collection. General surgery as well as urology were consulted. General surgery did not feel this was related to her bowel and has since signed off. Urology feels this is likely pelvic abscess vs urinoma. They recommend nephrostomy tubes bilaterally (these were in place prior to this admission) as well as foley in order to allow drainage of urine away from the bladder to allow healing.   On evening of 2/5, she started to spike fevers with respiratory complaints. CXR obtained showed LLL infiltrate. GI consulted given positive FOBT, low Hgb on admission and need for anticoagulation. EGD and colonoscopy unrevealing source of bleeding. Hgb has remained stable. Patient with almost complete resolution of symptoms from PNA (on her last day of antibiotics) and waiting Suprapubic catheter placed by IR before hopefully discharge back to SNF.   Assessment & Plan:   Principal Problem:   Septic shock ([place]) Active Problems:   Multiple sclerosis diagnosed 2002-not on Therapy any longer   DVT, lower  extremity (HCC)   Chronic indwelling Foley catheter   AKI (acute kidney injury) ([place])   Controlled diabetes mellitus type 2 with complications ([place])   Pelvic abscess in female   Acute metabolic encephalopathy   Anemia of chronic disease   Essential hypertension  Shock, multifactorial: Sepsis -Septic as well as hemorrhagic -Required levophed, her pressure is now stable off pressors -Initially thought source to be secondary to UTI. She was started on IV Vanco and Zosyn, but urine culture has now resulted with multiple organisms, UA does show large leuk, numerous WBC, many bacteria. Previous urine culture has shown pan-sensitive E Coli and Pseudomonas, resistant providencia  -CT renal stone on 1/30 with questionable cystitis, questionable diverticulitis or colitis  -Due to diarrhea, C. difficile PCR was ordered as well as oral vancomycin. However, before sample could be obtained, she stopped having diarrhea. Therefore PCR test, enteric precaution, oral vanco are stopped.  -Blood culture with 1 of 2 coag neg staph, likely contaminant  -Influenza negative  -CT abd/pelvis obtained due to continued leukocytosis, this showed multiloculated pelvic fluid collection  -Urology consulted: likely pelvic abscess vs urinoma. Continue nephrostomy tubes and foley to allow drainage of urine away from bladder for healing. -IR consulted by Urology for potential SP tube placement; repeat CT abd/pelvis demonstrating almost complete resolution of abscess. Planning to have SP placed on 2/14 now. -Will continue narrowed abx's by mouth Augmentin (finishing course). -Repeat blood culture pending  -also treating HCAP see below -sepsis features resolved. Hgb and VS remains stable.  LLL HCAP -Respiratory complaints started 2/5.  -Re-started vanco along with zosyn; which was given for 3 days. Will now transition  to Augmentin to complete therapy -patient afebrile for 72 hours now and with WBC's back to  normal. -tolerating good O2 sat on RA now -continue PRN duoneb -will also continue pulmicort and flutter valve   Acute metabolic encephalopathy (most likely toxic) -Now resolved and at baseline -most likely associated with fever and acute infection.   Acute on chronic normocytic anemia -Baseline Hgb 8.2-9.5 -Unclear etiology, had hemoccult positive stool but no report of melena or hematochezia to explain Hgb of 4 on admission  -Received 2u pRBC on 1/30  -INR normal, LDH normal, fibrinogen elevated, Haptoglobin 308. Not consistent with hemolytic anemia or DIC  -Vit B 12: 2,254. Folate: 1,996 -Continue to trend CBC intermitently. Hgb 7.6 (last checked on 2/13) -discussed with GI and after no signs of acute source of bleeding on EGD and colonoscopy done on  07/25/16. Recommendations are for IV iron and continue oral iron. If Hgb remains stable/rising w/o signs of bleeding, ok to resume anticoagulation. -no signs of acute bleeding currently  AKI  -Baseline Cr 0.5-0.7, Cr was 1.75 on admission -Resolved with IVF  -will monitor trend  History of DVT -Xarelto was held on admission due to anemia -Currently on SCDs -will follow Hgb trend and resume anticoagulation at discharge (if felt to be safe)  -no signs of acute bleeding currently   DM type 2, well controlled  -continue holding oral home meds  -Ha1c 5.3 -will continue SSI and modified carb diet   Essential HTN -Holding home antihypertensives due to shock on admission, BP currently stable without meds -will monitor and resume antihypertensive agents as needed   Hypokalemia -will replete as needed  -follow BMET to assess trend    DVT prophylaxis: SCDs Code Status: Full Family Communication: no family at bedside Disposition Plan: pending further stabilization, improvement in her breathing. Back to SNF at discharge. Per IR and following urology rec's, suprapubic catheter will be placed on 2/14. Hopefully good for SNF discharge  on 2/15.   Consultants:   PCCM  General surgery  Urology   GI  IR  Procedures:   EGD and colonoscopy: no source of bleeding seen.  Antimicrobials:  Anti-infectives    Start     Dose/Rate Route Frequency Ordered Stop   07/22/16 1400  amoxicillin-clavulanate (AUGMENTIN) 875-125 MG per tablet 1 tablet     1 tablet Oral Every 12 hours 07/22/16 1245     07/19/16 0600  vancomycin (VANCOCIN) IVPB 1000 mg/200 mL premix  Status:  Discontinued     1,000 mg 200 mL/hr over 60 Minutes Intravenous Every 12 hours 07/18/16 1810 07/22/16 0500   07/18/16 1815  vancomycin (VANCOCIN) IVPB 1000 mg/200 mL premix     1,000 mg 200 mL/hr over 60 Minutes Intravenous STAT 07/18/16 1810 07/18/16 1940   07/13/16 1400  vancomycin (VANCOCIN) IVPB 1000 mg/200 mL premix  Status:  Discontinued     1,000 mg 200 mL/hr over 60 Minutes Intravenous Every 24 hours 07/12/16 2058 07/14/16 1109   07/13/16 1200  vancomycin (VANCOCIN) IVPB 1000 mg/200 mL premix  Status:  Discontinued     1,000 mg 200 mL/hr over 60 Minutes Intravenous Every 24 hours 07/12/16 1412 07/12/16 2051   07/13/16 0000  vancomycin (VANCOCIN) 50 mg/mL oral solution 500 mg  Status:  Discontinued     500 mg Oral Every 6 hours 07/12/16 2051 07/15/16 1328   07/12/16 2200  piperacillin-tazobactam (ZOSYN) IVPB 3.375 g  Status:  Discontinued     3.375 g 12.5 mL/hr over 240 Minutes  Intravenous Every 8 hours 07/12/16 1412 07/12/16 2051   07/12/16 2200  piperacillin-tazobactam (ZOSYN) IVPB 3.375 g  Status:  Discontinued     3.375 g 12.5 mL/hr over 240 Minutes Intravenous Every 8 hours 07/12/16 2058 07/22/16 1245   07/12/16 1315  piperacillin-tazobactam (ZOSYN) IVPB 3.375 g     3.375 g 100 mL/hr over 30 Minutes Intravenous  Once 07/12/16 1314 07/12/16 1356   07/12/16 1315  vancomycin (VANCOCIN) IVPB 1000 mg/200 mL premix     1,000 mg 200 mL/hr over 60 Minutes Intravenous  Once 07/12/16 1314 07/12/16 1512      Subjective: Patient remains afebrile  and endorses breathing great. No CP, no abd pain, no nausea, no vomiting. Off oxygen for 48 hours now.  Objective: Vitals:   07/25/16 2105 07/26/16 0507 07/26/16 0802 07/26/16 1404  BP: (!) 151/90 (!) 144/77  (!) 146/71  Pulse: 92 79  85  Resp: 19 18  18   Temp: 99.6 F (37.6 C) 98.7 F (37.1 C)  99 F (37.2 C)  TempSrc: Oral Oral  Oral  SpO2: 97% 96% 95% 97%  Weight:      Height:        Intake/Output Summary (Last 24 hours) at 07/26/16 1732 Last data filed at 07/26/16 1527  Gross per 24 hour  Intake             1820 ml  Output             1030 ml  Net              790 ml   Filed Weights   07/23/16 0438 07/24/16 1230 07/25/16 1326  Weight: 68.6 kg (151 lb 3.8 oz) 67.8 kg (149 lb 7.6 oz) 67.6 kg (149 lb)   Examination:  General exam: Appears calm and comfortable, report breathing is excellent today and she is feeling ok. Patient has remained afebrile and tolerating antibiotics by mouth. No CP, no nausea, no vomiting. EGD and colonoscopy completed on 2/12 (no source of bleeding seen). Patient off oxygen and denies abd pain. Respiratory system: fair air movement, rhonchi L > R; positive mild exp wheezing. Respiratory effort normal. Cardiovascular system: S1 & S2 heard, RRR. No JVD, murmurs, rubs, gallops or clicks. No pedal edema. Gastrointestinal system: Abdomen is nondistended, soft,denies tenderness on palpation. No organomegaly or masses felt. Normal bowel sounds heard. Gu: Bilateral nephrostomy tubes present, foley in place  Central nervous system: Alert and oriented. CN intact, following commands properly. Patient with chronic motor deficits from MS. Extremities: no swelling, no cyanosis  Skin: No acute rashes or bruises, patient with +chronic bilateral feet with venous stasis lesions/skin changes  Psychiatry: Judgement and insight appear normal. Mood & affect appropriate.   Data Reviewed: I have personally reviewed following labs and imaging studies  CBC:  Recent  Labs Lab 07/20/16 0414 07/23/16 0905 07/26/16 0519  WBC 10.6* 14.0* 10.8*  NEUTROABS 6.9  --   --   HGB 7.9* 7.9* 7.6*  HCT 25.2* 24.6* 23.9*  MCV 87.8 89.5 87.9  PLT 447* 425* [id number]   Basic Metabolic Panel:  Recent Labs Lab 07/20/16 0414 07/22/16 0353 07/26/16 0519  NA 143 140 143  K 3.7 3.6 3.2*  CL 115* 110 113*  CO2 24 26 26   GLUCOSE 125* 148* 106*  BUN 5* 7 8  CREATININE 0.86 0.72 0.65  CALCIUM 8.7* 8.8* 9.4   GFR: Estimated Creatinine Clearance: 69.8 mL/min (by C-G formula based on SCr of 0.65 mg/dL).  CBG:  Recent Labs Lab 07/25/16 1144 07/25/16 1725 07/25/16 2103 07/26/16 0731 07/26/16 1206  GLUCAP 118* 121* 157* 106* 125*   Sepsis Labs: No results for input(s): PROCALCITON, LATICACIDVEN in the last 168 hours.  Recent Results (from the past 240 hour(s))  Culture, blood (routine x 2)     Status: None   Collection Time: 07/19/16  8:02 AM  Result Value Ref Range Status   Specimen Description BLOOD RIGHT HAND  Final   Special Requests BOTTLES DRAWN AEROBIC ONLY 5CC  Final   Culture   Final    NO GROWTH 5 DAYS Performed at [facility], [address], [place], [place] [address]    Report Status 07/24/2016 FINAL  Final  Culture, blood (routine x 2)     Status: None   Collection Time: 07/19/16  8:04 AM  Result Value Ref Range Status   Specimen Description BLOOD LEFT HAND  Final   Special Requests IN PEDIATRIC BOTTLE 3CC  Final   Culture   Final    NO GROWTH 5 DAYS Performed at [facility], [place] [address] [place], [place] [address]    Report Status 07/24/2016 FINAL  Final     Radiology Studies: Ct Abdomen Pelvis W Contrast  Result Date: 07/26/2016 CLINICAL DATA:  Pelvic fluid collections, fever. EXAM: CT ABDOMEN AND PELVIS WITH CONTRAST TECHNIQUE: Multidetector CT imaging of the abdomen and pelvis was performed using the standard protocol following bolus administration of intravenous contrast. CONTRAST:  13mL ISOVUE-300 IOPAMIDOL  (ISOVUE-300) INJECTION 61% COMPARISON:  CT scan of July 15, 2016. FINDINGS: Lower chest: Minimal bilateral pleural effusions are noted with adjacent subsegmental atelectasis. Hepatobiliary: No gallstones are noted. Small hepatic cysts are noted. Pancreas: Unremarkable. No pancreatic ductal dilatation or surrounding inflammatory changes. Spleen: Normal in size without focal abnormality. Adrenals/Urinary Tract: Adrenal glands appear normal. Bilateral nephrostomy catheters are noted in grossly good position. 18 mm nonobstructive calculus is noted in upper pole collecting system of left kidney. 17 mm calculus is noted in left renal pelvis. Moderate left hydronephrosis is noted secondary to 16 x 7 mm calculus in distal left ureter. Urinary bladder is decompressed secondary to Foley catheter. Stomach/Bowel: There is no evidence of bowel obstruction. The appendix appears normal. Vascular/Lymphatic: Left retroperitoneal lymph nodes are again noted which are improved compared to prior exam, with the largest measuring 9 mm. Reproductive: Stable large fibroid uterus is noted. Ovaries are unremarkable. Other: The fluid collections seen anteriorly in the pelvis on the prior exam appear to have nearly completely resolved. Small residual fluid collection measuring 4 mm is noted anteriorly in the left. Musculoskeletal: No acute or significant osseous findings. IMPRESSION: Bilateral nephrostomy tubes are in grossly good position. Urinary bladder is decompressed secondary to Foley catheter. Large nonobstructive calculi are noted in the upper pole collecting system of left kidney and left renal pelvis. Moderate left hydronephrosis is noted secondary to 16 x 7 mm calculus in distal left ureter. Stable large fibroid uterus. Left retroperitoneal adenopathy is again noted, but improved compared to prior exam. Fluid collections seen anteriorly in the pelvis on the prior exam appear to have nearly completely resolved. Electronically  Signed   By: Marijo Conception, M.D.   On: 07/26/2016 11:22     Scheduled Meds: . amoxicillin-clavulanate  1 tablet Oral Q12H  . atorvastatin  10 mg Oral QHS  . budesonide (PULMICORT) nebulizer solution  0.25 mg Nebulization BID  . famotidine  20 mg Oral BID  . feeding  supplement (ENSURE ENLIVE)  237 mL Oral BID BM  . hydrocerin   Topical BID  . insulin aspart  0-9 Units Subcutaneous TID WC  . iopamidol      . sodium chloride       Continuous Infusions:    LOS: 14 days    Time spent: 25 minutes   Barton Dubois, MD [phone] [facility] [email or web address] Password [facility] 07/26/2016, 5:32 PM

## 2016-07-27 ENCOUNTER — Encounter (HOSPITAL_COMMUNITY): Payer: Self-pay | Admitting: Radiology

## 2016-07-27 ENCOUNTER — Inpatient Hospital Stay (HOSPITAL_COMMUNITY): Payer: Medicare Other

## 2016-07-27 DIAGNOSIS — N739 Female pelvic inflammatory disease, unspecified: Secondary | ICD-10-CM

## 2016-07-27 DIAGNOSIS — G9341 Metabolic encephalopathy: Secondary | ICD-10-CM

## 2016-07-27 DIAGNOSIS — E118 Type 2 diabetes mellitus with unspecified complications: Secondary | ICD-10-CM

## 2016-07-27 DIAGNOSIS — I10 Essential (primary) hypertension: Secondary | ICD-10-CM

## 2016-07-27 DIAGNOSIS — I82402 Acute embolism and thrombosis of unspecified deep veins of left lower extremity: Secondary | ICD-10-CM

## 2016-07-27 DIAGNOSIS — D638 Anemia in other chronic diseases classified elsewhere: Secondary | ICD-10-CM

## 2016-07-27 DIAGNOSIS — G35 Multiple sclerosis: Secondary | ICD-10-CM

## 2016-07-27 LAB — BASIC METABOLIC PANEL
ANION GAP: 4 — AB (ref 5–15)
BUN: 7 mg/dL (ref 6–20)
CALCIUM: 9.4 mg/dL (ref 8.9–10.3)
CO2: 25 mmol/L (ref 22–32)
CREATININE: 0.71 mg/dL (ref 0.44–1.00)
Chloride: 113 mmol/L — ABNORMAL HIGH (ref 101–111)
Glucose, Bld: 105 mg/dL — ABNORMAL HIGH (ref 65–99)
Potassium: 3.2 mmol/L — ABNORMAL LOW (ref 3.5–5.1)
SODIUM: 142 mmol/L (ref 135–145)

## 2016-07-27 LAB — GLUCOSE, CAPILLARY
GLUCOSE-CAPILLARY: 90 mg/dL (ref 65–99)
GLUCOSE-CAPILLARY: 95 mg/dL (ref 65–99)
Glucose-Capillary: 109 mg/dL — ABNORMAL HIGH (ref 65–99)
Glucose-Capillary: 172 mg/dL — ABNORMAL HIGH (ref 65–99)

## 2016-07-27 LAB — CBC
HCT: 23.5 % — ABNORMAL LOW (ref 36.0–46.0)
Hemoglobin: 7.6 g/dL — ABNORMAL LOW (ref 12.0–15.0)
MCH: 28.4 pg (ref 26.0–34.0)
MCHC: 32.3 g/dL (ref 30.0–36.0)
MCV: 87.7 fL (ref 78.0–100.0)
PLATELETS: 389 10*3/uL (ref 150–400)
RBC: 2.68 MIL/uL — ABNORMAL LOW (ref 3.87–5.11)
RDW: 17.2 % — AB (ref 11.5–15.5)
WBC: 8.9 10*3/uL (ref 4.0–10.5)

## 2016-07-27 MED ORDER — POTASSIUM CHLORIDE CRYS ER 20 MEQ PO TBCR
40.0000 meq | EXTENDED_RELEASE_TABLET | Freq: Two times a day (BID) | ORAL | Status: DC
  Administered 2016-07-27 – 2016-07-28 (×2): 40 meq via ORAL
  Filled 2016-07-27 (×2): qty 2

## 2016-07-27 MED ORDER — MIDAZOLAM HCL 2 MG/2ML IJ SOLN
INTRAMUSCULAR | Status: AC
  Filled 2016-07-27: qty 6

## 2016-07-27 MED ORDER — MIDAZOLAM HCL 2 MG/2ML IJ SOLN
INTRAMUSCULAR | Status: AC | PRN
  Administered 2016-07-27 (×4): 1 mg via INTRAVENOUS

## 2016-07-27 MED ORDER — FLUMAZENIL 0.5 MG/5ML IV SOLN
INTRAVENOUS | Status: AC
  Filled 2016-07-27: qty 5

## 2016-07-27 MED ORDER — BOOST / RESOURCE BREEZE PO LIQD
1.0000 | Freq: Two times a day (BID) | ORAL | Status: DC
  Administered 2016-07-27 – 2016-07-28 (×2): 1 via ORAL

## 2016-07-27 MED ORDER — NALOXONE HCL 0.4 MG/ML IJ SOLN
INTRAMUSCULAR | Status: AC
  Filled 2016-07-27: qty 1

## 2016-07-27 MED ORDER — FENTANYL CITRATE (PF) 100 MCG/2ML IJ SOLN
INTRAMUSCULAR | Status: AC
  Filled 2016-07-27: qty 6

## 2016-07-27 MED ORDER — FENTANYL CITRATE (PF) 100 MCG/2ML IJ SOLN
INTRAMUSCULAR | Status: AC | PRN
  Administered 2016-07-27 (×4): 50 ug via INTRAVENOUS

## 2016-07-27 MED ORDER — SODIUM CHLORIDE 0.9 % IV SOLN
INTRAVENOUS | Status: AC
  Filled 2016-07-27: qty 250

## 2016-07-27 NOTE — Sedation Documentation (Signed)
Patient had BM on the CT table. Procedure paused while staff cleans patient. MD has left room and will return shortly. Will continue to monitor.  - Soyla Dryer, RN

## 2016-07-27 NOTE — Procedures (Signed)
CT guided suprapubic catheter was placed.  Bladder was initially decompressed.  Therefore, 250 ml of saline in infused through the foley catheter.  12 French tube was placed without complication.  Minimal blood loss.  Foley catheter was removed at end of procedure.  See full report in Imaging.

## 2016-07-27 NOTE — Progress Notes (Signed)
PROGRESS NOTE    [name]  [id number] DOB: December 07, 1960 DOA: 07/12/2016 PCP: Blanchie Serve, MD   Brief Narrative: [name] is a 56 yo female with past medical history of multiple sclerosis, history of DVT on Xarelto, hx of bilateral obstructing nephrolithiasis s/p bilateral perc nephrostomy tube in July 2017 and December 2017, who presented to the emergency department due to fevers. She was encephalopathic at time of admission. In the emergency department, she had a fever of 103, hypotensive, lactic acidosis, and hemoglobin of 4. She was initially admitted to [facility] services, with presumed sepsis secondary to UTI. She was treated with IV antibiotics, given blood products. She did require pressors for blood pressure support. She was transferred to [facility] with stabilization of BP on 07/15/16.  CT abdomen/pelvis was obtained as patient continued with leukocytosis. This showed multiloculated pelvic fluid collection. General surgery as well as urology were consulted. General surgery did not feel this was related to her bowel and has since signed off. Urology feels this is likely pelvic abscess vs urinoma. They recommend nephrostomy tubes bilaterally (these were in place prior to this admission) as well as foley in order to allow drainage of urine away from the bladder to allow healing.   On evening of 2/5, she started to spike fevers with respiratory complaints. CXR obtained showed LLL infiltrate. GI consulted given positive FOBT, low Hgb on admission and need for anticoagulation. EGD and colonoscopy unrevealing source of bleeding. Hgb has remained stable. Patient with almost complete resolution of symptoms from PNA and underwent Suprapubic catheter placed by IR today before hopefully discharging back to SNF in AM.  Assessment & Plan:   Principal Problem:   Septic shock ([place]) Active Problems:   Multiple sclerosis diagnosed 2002-not on Therapy any longer   DVT, lower extremity (HCC)   Chronic  indwelling Foley catheter   AKI (acute kidney injury) ([place])   Controlled diabetes mellitus type 2 with complications ([place])   Pelvic abscess in female   Acute metabolic encephalopathy   Anemia of chronic disease   Essential hypertension  Shock, multifactorial: Sepsis, improved -Septic as well as hemorrhagic -Required levophed, her pressure is now stable off pressors -Initially thought source to be secondary to UTI. She was started on IV Vanco and Zosyn, but urine culture has now resulted with multiple organisms, UA does show large leuk, numerous WBC, many bacteria. Previous urine culture has shown pan-sensitive E Coli and Pseudomonas, resistant providencia  -CT renal stone on 1/30 with questionable cystitis, questionable diverticulitis or colitis  -Due to diarrhea, C. difficile PCR was ordered as well as oral vancomycin. However, before sample could be obtained, she stopped having diarrhea. Therefore PCR test, enteric precaution, oral vanco are stopped.  -Blood culture with 1 of 2 coag neg staph, likely contaminant  -Influenza negative  -CT abd/pelvis obtained due to continued leukocytosis, this showed multiloculated pelvic fluid collection  -Urology consulted: likely pelvic abscess vs urinoma. Continue nephrostomy tubes and foley to allow drainage of urine away from bladder for healing. -IR consulted by Urology  SP tube placement; repeat CT abd/pelvis demonstrating almost complete resolution of abscess. Suprapubic Catheter placed today 07/27/2016  -D/C'd continue narrowed abx's by mouth Augmentin (finished course). -Repeat blood cultures showed NGTD at 5 days -also treating HCAP see below -sepsis physiology features resolved. Hgb (7.6) and VS remains stable.  LLL HCAP -Respiratory complaints started 2/5.  -Re-started vanco along with zosyn; which was given for 3 days. Transitioned to Augmentin to complete therapy and  D/C'd today -patient afebrile for 72 hours now and with WBC's back to  normal. -tolerating good O2 sat on RA now -continue PRN duoneb -will also continue pulmicort 0.25 mg po BID and Albuterol 2.5 mg Neb q2hprn and flutter valve  -C/w Hycodan 5 mL q6hprn Cough  Hypokalemia -Patient's K+ was 3.2 this AM -Replete -Repeat CMP in AM  Acute metabolic encephalopathy (most likely toxic) -Now resolved and at baseline -most likely associated with fever and acute infection.   Acute on chronic normocytic anemia -Baseline Hgb 8.2-9.5 -Unclear etiology, had hemoccult positive stool but no report of melena or hematochezia to explain Hgb of 4 on admission  -Received 2u pRBC on 1/30  -INR normal, LDH normal, fibrinogen elevated, Haptoglobin 308. Not consistent with hemolytic anemia or DIC  -Vit B 12: 2,254. Folate: 1,996 -Continue to trend CBC intermitently. Hgb 7.6 (last checked on 2/14) -discussed with GI and after no signs of acute source of bleeding on EGD and colonoscopy done on  07/25/16. Recommendations are for IV iron and continue oral iron. If Hgb remains stable/rising w/o signs of bleeding, ok to resume anticoagulation likely tomorrow. -IV Iron started yesterday with Feurmoxytol 510 mg Iv; May have second dose in 3-8 days if needed  -no signs of acute bleeding currently  AKI  -Baseline Cr 0.5-0.7, Cr was 1.75 on admission -Resolved with IVF  -will monitor trend BMP's  History of DVT -Xarelto was held on admission due to anemia -Currently on SCDs; Had Suprapubic Catheter placed this AM -will follow Hgb trend and resume anticoagulation at discharge (if felt to be safe)  -no signs of acute bleeding currently   DM type 2, well controlled  -continue holding oral home meds  -Ha1c 5.3 -will continue Sensitive Novolog SSI and modified carb diet   Essential HTN -Holding home antihypertensives due to shock on admission, BP currently stable without meds -will monitor and resume antihypertensive agents as needed   Hyperlipidemia -Continue Atorvastatin  10 mg po qHS  GERD -C/w Famotidine 20 mg po BID  Protein Calorie Malutrition: Severe -Nutritionist/Dietician recc's appreciated -C/w Boost/Resource Breeze 1 container po BID between Meals  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 -Not on any therapy any more  DVT prophylaxis: SCDs Code Status: FULL CODE Family Communication: No family present at bedside Disposition Plan: SNF [facility]   Consultants:   [facility]  Urology  Gastroenterology  Interventional Radiology   Procedures: Suprapubic Catheter placement 07/27/16  EGD/Colonoscopy 07/25/16 - No source of bleeding seen   Antimicrobials:  Anti-infectives    Start     Dose/Rate Route Frequency Ordered Stop   07/22/16 1400  amoxicillin-clavulanate (AUGMENTIN) 875-125 MG per tablet 1 tablet  Status:  Discontinued     1 tablet Oral Every 12 hours 07/22/16 1245 07/27/16 1554   07/19/16 0600  vancomycin (VANCOCIN) IVPB 1000 mg/200 mL premix  Status:  Discontinued     1,000 mg 200 mL/hr over 60 Minutes Intravenous Every 12 hours 07/18/16 1810 07/22/16 0500   07/18/16 1815  vancomycin (VANCOCIN) IVPB 1000 mg/200 mL premix     1,000 mg 200 mL/hr over 60 Minutes Intravenous STAT 07/18/16 1810 07/18/16 1940   07/13/16 1400  vancomycin (VANCOCIN) IVPB 1000 mg/200 mL premix  Status:  Discontinued     1,000 mg 200 mL/hr over 60 Minutes Intravenous Every 24 hours 07/12/16 2058 07/14/16 1109   07/13/16 1200  vancomycin (VANCOCIN) IVPB 1000 mg/200 mL premix  Status:  Discontinued  1,000 mg 200 mL/hr over 60 Minutes Intravenous Every 24 hours 07/12/16 1412 07/12/16 2051   07/13/16 0000  vancomycin (VANCOCIN) 50 mg/mL oral solution 500 mg  Status:  Discontinued     500 mg Oral Every 6 hours 07/12/16 2051 07/15/16 1328   07/12/16 2200  piperacillin-tazobactam (ZOSYN) IVPB 3.375 g  Status:  Discontinued     3.375 g 12.5 mL/hr over 240 Minutes Intravenous Every 8 hours 07/12/16 1412 07/12/16  2051   07/12/16 2200  piperacillin-tazobactam (ZOSYN) IVPB 3.375 g  Status:  Discontinued     3.375 g 12.5 mL/hr over 240 Minutes Intravenous Every 8 hours 07/12/16 2058 07/22/16 1245   07/12/16 1315  piperacillin-tazobactam (ZOSYN) IVPB 3.375 g     3.375 g 100 mL/hr over 30 Minutes Intravenous  Once 07/12/16 1314 07/12/16 1356   07/12/16 1315  vancomycin (VANCOCIN) IVPB 1000 mg/200 mL premix     1,000 mg 200 mL/hr over 60 Minutes Intravenous  Once 07/12/16 1314 07/12/16 1512     Subjective: Seen and examined after suprapubic cathter placement and complained of some tenderness. No nausea or vomiting. States she wants to re-locate to [place] to be closer to family.   Objective: Vitals:   07/27/16 0941 07/27/16 0949 07/27/16 1111 07/27/16 1447  BP: 131/66  (!) 147/71 (!) 141/73  Pulse: 74  77 81  Resp: 15  16 16   Temp:   97.8 F (36.6 C) 97.8 F (36.6 C)  TempSrc:   Oral Oral  SpO2: 95% 95% 100% 100%  Weight:      Height:        Intake/Output Summary (Last 24 hours) at 07/27/16 1555 Last data filed at 07/27/16 1039  Gross per 24 hour  Intake              380 ml  Output             1275 ml  Net             -895 ml   Filed Weights   07/24/16 1230 07/25/16 1326 07/27/16 0500  Weight: 67.8 kg (149 lb 7.6 oz) 67.6 kg (149 lb) 65.7 kg (144 lb 13.5 oz)   Examination: Physical Exam:  Constitutional: WN/WD, NAD and appears calm and comfortable Eyes: Llids and conjunctivae normal, sclerae anicteric  ENMT: External Ears, Nose appear normal. Grossly normal hearing. Poor dentition Neck: Appears normal, supple, no cervical masses, normal ROM, no appreciable thyromegaly, no JVD Respiratory: Diminished to auscultation bilaterally, no wheezing, rales, rhonchi or crackles. Normal respiratory effort and patient is not tachypenic. No accessory muscle use. Patient not using any supplemental O2.  Cardiovascular: RRR, no murmurs / rubs / gallops. S1 and S2 auscultated. No extremity  edema. 2+ pedal pulses. No carotid bruits.  Abdomen: Soft, mildly tender in lower abdomen, non-distended. No masses palpated. No appreciable hepatosplenomegaly. Bowel sounds positive.  GU: Deferred. Bilateral Nephrostomy tubes; Suprapubic Foley catheter in place with no evidence of bleeding. Musculoskeletal: No clubbing / cyanosis of digits/nails.  Skin: No rashes, lesions on limited skin eval. No induration; Warm and dry. Chronic venous stasis skin changes on lower feet Neurologic: CN 2-12 grossly intact with no focal deficits.  Romberg sign cerebellar reflexes not assessed.  Psychiatric: Normal judgment and insight. Alert and oriented x 3. Normal mood and appropriate affect.   Data Reviewed: I have personally reviewed following labs and imaging studies  CBC:  Recent Labs Lab 07/23/16 0905 07/26/16 0519 07/27/16 0509  WBC 14.0* 10.8* 8.9  HGB 7.9* 7.6* 7.6*  HCT 24.6* 23.9* 23.5*  MCV 89.5 87.9 87.7  PLT 425* 390 [id number]   Basic Metabolic Panel:  Recent Labs Lab 07/22/16 0353 07/26/16 0519 07/27/16 0509  NA 140 143 142  K 3.6 3.2* 3.2*  CL 110 113* 113*  CO2 26 26 25   GLUCOSE 148* 106* 105*  BUN 7 8 7   CREATININE 0.72 0.65 0.71  CALCIUM 8.8* 9.4 9.4   GFR: Estimated Creatinine Clearance: 69.8 mL/min (by C-G formula based on SCr of 0.71 mg/dL). Liver Function Tests: No results for input(s): AST, ALT, ALKPHOS, BILITOT, PROT, ALBUMIN in the last 168 hours. No results for input(s): LIPASE, AMYLASE in the last 168 hours. No results for input(s): AMMONIA in the last 168 hours. Coagulation Profile:  Recent Labs Lab 07/26/16 0519  INR 1.15   Cardiac Enzymes: No results for input(s): CKTOTAL, CKMB, CKMBINDEX, TROPONINI in the last 168 hours. BNP (last 3 results) No results for input(s): PROBNP in the last 8760 hours. HbA1C: No results for input(s): HGBA1C in the last 72 hours. CBG:  Recent Labs Lab 07/26/16 1206 07/26/16 1737 07/26/16 2108 07/27/16 0742  07/27/16 1210  GLUCAP 125* 139* 168* 109* 172*   Lipid Profile: No results for input(s): CHOL, HDL, LDLCALC, TRIG, CHOLHDL, LDLDIRECT in the last 72 hours. Thyroid Function Tests: No results for input(s): TSH, T4TOTAL, FREET4, T3FREE, THYROIDAB in the last 72 hours. Anemia Panel: No results for input(s): VITAMINB12, FOLATE, FERRITIN, TIBC, IRON, RETICCTPCT in the last 72 hours. Sepsis Labs: No results for input(s): PROCALCITON, LATICACIDVEN in the last 168 hours.  Recent Results (from the past 240 hour(s))  Culture, blood (routine x 2)     Status: None   Collection Time: 07/19/16  8:02 AM  Result Value Ref Range Status   Specimen Description BLOOD RIGHT HAND  Final   Special Requests BOTTLES DRAWN AEROBIC ONLY 5CC  Final   Culture   Final    NO GROWTH 5 DAYS Performed at [facility], [address] [place], [place] [address]    Report Status 07/24/2016 FINAL  Final  Culture, blood (routine x 2)     Status: None   Collection Time: 07/19/16  8:04 AM  Result Value Ref Range Status   Specimen Description BLOOD LEFT HAND  Final   Special Requests IN PEDIATRIC BOTTLE 3CC  Final   Culture   Final    NO GROWTH 5 DAYS Performed at [facility], [place] [address], [place],  [place] [address]    Report Status 07/24/2016 FINAL  Final   Radiology Studies: Ct Abdomen Pelvis W Contrast  Result Date: 07/26/2016 CLINICAL DATA:  Pelvic fluid collections, fever. EXAM: CT ABDOMEN AND PELVIS WITH CONTRAST TECHNIQUE: Multidetector CT imaging of the abdomen and pelvis was performed using the standard protocol following bolus administration of intravenous contrast. CONTRAST:  173mL ISOVUE-300 IOPAMIDOL (ISOVUE-300) INJECTION 61% COMPARISON:  CT scan of July 15, 2016. FINDINGS: Lower chest: Minimal bilateral pleural effusions are noted with adjacent subsegmental atelectasis. Hepatobiliary: No gallstones are noted. Small hepatic cysts are noted. Pancreas: Unremarkable. No pancreatic ductal  dilatation or surrounding inflammatory changes. Spleen: Normal in size without focal abnormality. Adrenals/Urinary Tract: Adrenal glands appear normal. Bilateral nephrostomy catheters are noted in grossly good position. 18 mm nonobstructive calculus is noted in upper pole collecting system of left kidney. 17 mm calculus is noted in left renal pelvis. Moderate left hydronephrosis is noted secondary to 16 x 7 mm calculus in distal left ureter.  Urinary bladder is decompressed secondary to Foley catheter. Stomach/Bowel: There is no evidence of bowel obstruction. The appendix appears normal. Vascular/Lymphatic: Left retroperitoneal lymph nodes are again noted which are improved compared to prior exam, with the largest measuring 9 mm. Reproductive: Stable large fibroid uterus is noted. Ovaries are unremarkable. Other: The fluid collections seen anteriorly in the pelvis on the prior exam appear to have nearly completely resolved. Small residual fluid collection measuring 4 mm is noted anteriorly in the left. Musculoskeletal: No acute or significant osseous findings. IMPRESSION: Bilateral nephrostomy tubes are in grossly good position. Urinary bladder is decompressed secondary to Foley catheter. Large nonobstructive calculi are noted in the upper pole collecting system of left kidney and left renal pelvis. Moderate left hydronephrosis is noted secondary to 16 x 7 mm calculus in distal left ureter. Stable large fibroid uterus. Left retroperitoneal adenopathy is again noted, but improved compared to prior exam. Fluid collections seen anteriorly in the pelvis on the prior exam appear to have nearly completely resolved. Electronically Signed   By: Marijo Conception, M.D.   On: 07/26/2016 11:22   Ct Image Guided Drainage By Percutaneous Catheter  Result Date: 07/27/2016 INDICATION: 56 year old with multiple sclerosis and bilateral nephrostomy tubes. Concern for a bladder perforation. Cystoscopy demonstrated bladder dome  inflammation and urology requested placement of a suprapubic tube. EXAM: CT-GUIDED PLACEMENT OF SUPRAPUBIC BLADDER CATHETER COMPARISON:  None. MEDICATIONS: Patient was already on scheduled antibiotics. ANESTHESIA/SEDATION: Fentanyl 200 mcg IV; Versed 4.0 mg IV Moderate Sedation Time:  42 minutes The patient was continuously monitored during the procedure by the interventional radiology nurse under my direct supervision. CONTRAST:  None FLUOROSCOPY TIME:  None COMPLICATIONS: None immediate. PROCEDURE: Informed written consent was obtained from the patient after a thorough discussion of the procedural risks, benefits and alternatives. All questions were addressed. A timeout was performed prior to the initiation of the procedure. CT images through the pelvis were obtained. The urinary bladder was initially decompressed with a Foley catheter. As a result, 250 mL of sterile saline was instilled in the bladder through the Foley catheter. Once the bladder was adequately distended, the anterior pelvis was prepped and draped in a sterile fashion. Skin was anesthetized with 1% lidocaine. 18 gauge trocar needle was directed into the bladder with CT guidance and a stiff Amplatz wire was placed. The tract was dilated to accommodate a 12 [place] multipurpose drain. The drain was easily advanced over the wire and reconstituted in the bladder. Catheter was sutured to the skin with suture and attached to a gravity bag. The Foley catheter balloon was deflated and the Foley catheter was removed without complication. FINDINGS: Initial images demonstrated a decompressed urinary bladder with a Foley catheter. Bladder was adequate distended with normal saline. Evidence for multiple gas-filled bladder diverticula but no definite bladder leak. 12 French drain was successfully advanced into the bladder. IMPRESSION: Successful placement of a CT-guided suprapubic bladder catheter. Electronically Signed   By: Markus Daft M.D.   On: 07/27/2016  12:06   Scheduled Meds: . atorvastatin  10 mg Oral QHS  . budesonide (PULMICORT) nebulizer solution  0.25 mg Nebulization BID  . famotidine  20 mg Oral BID  . feeding supplement  1 Container Oral BID BM  . fentaNYL      . hydrocerin   Topical BID  . insulin aspart  0-9 Units Subcutaneous TID WC  . midazolam      . sodium chloride       Continuous Infusions:  LOS: 15 days   Kerney Elbe, DO [facility] Pager [phone]  If 7PM-7AM, please contact night-coverage [email or web address] Password [email or web address] 07/27/2016, 3:55 PM

## 2016-07-27 NOTE — Progress Notes (Signed)
Nutrition Follow-up  DOCUMENTATION CODES:   Not applicable  INTERVENTION:   Ensure Enlive po BID, each supplement provides 350 kcal and 20 grams of protein  Boost Breeze po TID, each supplement provides 250 kcal and 9 grams of protein  NUTRITION DIAGNOSIS:   Increased nutrient needs related to wound healing, acute illness as evidenced by increased estimated needs from protein, severe depletion of muscle mass.  GOAL:   Patient will meet greater than or equal to 90% of their needs  MONITOR:   PO intake, Supplement acceptance, Skin, Weight trends, Labs  ASSESSMENT:   57 yo female with past medical history of multiple sclerosis, history of DVT on Xarelto, hx of bilateral obstructing nephrolithiasis s/p bilateral perc nephrostomy tube in July 2017 and December 2017, who presented to the emergency department due to fevers.  Admitted for septic shock, CT renal stone on 1/30 with questionable cystitis, questionable diverticulitis or colitis. Urology consulted: likely pelvic abscess vs urinoma.   Met with pt in room today, pt continues to eat well. On 2/5, pt started to spike fevers with respiratory complaints. CXR obtained showed LLL infiltrate. GI consulted given positive FOBT, low Hgb on admission and need for anticoagulation. EGD and colonoscopy unrevealing for source of bleeding. Pt currently on clear liquid diet and drinking 100% Boost Breeze. Pt was previously drinking Ensure while on heart healthy diet. Pt's weights remain stable since admit. Pt with mild hypokalemia. Continue to monitor and supplement as needed per MD.  Medications reviewed and include: augmentin, pepcid, fentanyl, insulin  Labs reviewed: K 3.2(L), Cl 113(H) Hgb 7.6(L), Hct 23.5(L)  Diet Order:  Diet clear liquid Room service appropriate? Yes; Fluid consistency: Thin  Skin:  Wound (see comment) (wound bilateral heels)  Last BM:  2/13  Height:   Ht Readings from Last 1 Encounters:  07/25/16 '5\' 5"'  (1.651 m)     Weight:   Wt Readings from Last 1 Encounters:  07/27/16 144 lb 13.5 oz (65.7 kg)    Ideal Body Weight:  56.8 kg  BMI:  Body mass index is 24.1 kg/m.  Estimated Nutritional Needs:   Kcal:  1400-1700kcal/day   Protein:  84-98g/day   Fluid:  >1.4L/day   EDUCATION NEEDS:   No education needs identified at this time  Koleen Distance, RD, LDN Pager #[phone]

## 2016-07-28 DIAGNOSIS — Z9289 Personal history of other medical treatment: Secondary | ICD-10-CM

## 2016-07-28 LAB — GLUCOSE, CAPILLARY
Glucose-Capillary: 94 mg/dL (ref 65–99)
Glucose-Capillary: 186 mg/dL — ABNORMAL HIGH (ref 65–99)

## 2016-07-28 LAB — CBC WITH DIFFERENTIAL/PLATELET
BASOS ABS: 0 10*3/uL (ref 0.0–0.1)
BASOS PCT: 0 %
Eosinophils Absolute: 0.2 10*3/uL (ref 0.0–0.7)
Eosinophils Relative: 2 %
HEMATOCRIT: 24.3 % — AB (ref 36.0–46.0)
HEMOGLOBIN: 7.8 g/dL — AB (ref 12.0–15.0)
LYMPHS PCT: 34 %
Lymphs Abs: 3.2 10*3/uL (ref 0.7–4.0)
MCH: 27.5 pg (ref 26.0–34.0)
MCHC: 32.1 g/dL (ref 30.0–36.0)
MCV: 85.6 fL (ref 78.0–100.0)
MONOS PCT: 8 %
Monocytes Absolute: 0.8 10*3/uL (ref 0.1–1.0)
NEUTROS PCT: 56 %
Neutro Abs: 5.2 10*3/uL (ref 1.7–7.7)
Platelets: 377 10*3/uL (ref 150–400)
RBC: 2.84 MIL/uL — ABNORMAL LOW (ref 3.87–5.11)
RDW: 16.9 % — ABNORMAL HIGH (ref 11.5–15.5)
WBC: 9.4 10*3/uL (ref 4.0–10.5)

## 2016-07-28 LAB — COMPREHENSIVE METABOLIC PANEL
ALBUMIN: 2.4 g/dL — AB (ref 3.5–5.0)
ALK PHOS: 58 U/L (ref 38–126)
ALT: 14 U/L (ref 14–54)
AST: 11 U/L — ABNORMAL LOW (ref 15–41)
Anion gap: 5 (ref 5–15)
BUN: 5 mg/dL — AB (ref 6–20)
CALCIUM: 9.5 mg/dL (ref 8.9–10.3)
CO2: 23 mmol/L (ref 22–32)
CREATININE: 0.6 mg/dL (ref 0.44–1.00)
Chloride: 117 mmol/L — ABNORMAL HIGH (ref 101–111)
GFR calc Af Amer: >60 mL/min (ref >60)
GFR calc non Af Amer: >60 mL/min (ref >60)
GLUCOSE: 106 mg/dL — AB (ref 65–99)
Potassium: 3.3 mmol/L — ABNORMAL LOW (ref 3.5–5.1)
SODIUM: 145 mmol/L (ref 135–145)
Total Bilirubin: 0.9 mg/dL (ref 0.3–1.2)
Total Protein: 7.1 g/dL (ref 6.5–8.1)

## 2016-07-28 LAB — MAGNESIUM: Magnesium: 1.4 mg/dL — ABNORMAL LOW (ref 1.7–2.4)

## 2016-07-28 LAB — PHOSPHORUS: Phosphorus: 2.5 mg/dL (ref 2.5–4.6)

## 2016-07-28 MED ORDER — HYDROCODONE-HOMATROPINE 5-1.5 MG/5ML PO SYRP: 5.0000 mL | ORAL_SOLUTION | Freq: Four times a day (QID) | ORAL | 0 refills | Status: DC | PRN

## 2016-07-28 MED ORDER — HEPARIN SOD (PORK) LOCK FLUSH 100 UNIT/ML IV SOLN: 500.0000 [IU] | INTRAVENOUS | Status: DC | PRN

## 2016-07-28 MED ORDER — HYDROCERIN EX CREA: 1.0000 "application " | TOPICAL_CREAM | Freq: Two times a day (BID) | CUTANEOUS | 0 refills | Status: DC

## 2016-07-28 MED ORDER — MAGNESIUM SULFATE 2 GM/50ML IV SOLN
2.0000 g | Freq: Once | INTRAVENOUS | Status: AC
  Administered 2016-07-28: 2 g via INTRAVENOUS
  Filled 2016-07-28: qty 50

## 2016-07-28 MED ORDER — SODIUM CHLORIDE 0.9 % IV SOLN
30.0000 meq | Freq: Once | INTRAVENOUS | Status: DC
  Filled 2016-07-28: qty 15

## 2016-07-28 MED ORDER — BOOST / RESOURCE BREEZE PO LIQD: 1.0000 | Freq: Two times a day (BID) | ORAL | 0 refills | Status: DC

## 2016-07-28 NOTE — Progress Notes (Signed)
Patient reports that she was brought to the facility in her wheelchair and would like her wheelchair to be transported back to facility.   Per employee at the facility, patient's electric wheelchair is in the room.   Report given to [facility] at the facility. No questions or concerns at this time. Left my number for any questions.

## 2016-07-28 NOTE — Progress Notes (Signed)
3 Days Post-Op Subjective: Patient reports feelinng fine s/p sp tube placement  Objective: Vital signs in last 24 hours: Temp:  [97.8 F (36.6 C)-99.2 F (37.3 C)] 99.2 F (37.3 C) (02/15 0440) Pulse Rate:  [67-89] 85 (02/15 0440) Resp:  [8-25] 20 (02/15 0440) BP: (130-163)/(66-78) 144/75 (02/15 0440) SpO2:  [94 %-100 %] 94 % (02/15 0440)  Intake/Output from previous day: 02/14 0701 - 02/15 0700 In: 460  Out: 1275 [Urine:950; Drains:325] Intake/Output this shift: Total I/O In: -  Out: 875 [Urine:550; Drains:325]  Physical Exam:  Constitutional: Vital signs reviewed. WD WN in NAD   Pulmonary/Chest: Normal effort Abdominal: Soft. Non-tender, non-distended   Lab Results:  Recent Labs  07/26/16 0519 07/27/16 0509 07/28/16 0435  HGB 7.6* 7.6* 7.8*  HCT 23.9* 23.5* 24.3*   BMET  Recent Labs  07/27/16 0509 07/28/16 0435  NA 142 145  K 3.2* 3.3*  CL 113* 117*  CO2 25 23  GLUCOSE 105* 106*  BUN 7 5*  CREATININE 0.71 0.60  CALCIUM 9.4 9.5    Recent Labs  07/26/16 0519  INR 1.15   No results for input(s): LABURIN in the last 72 hours. Results for orders placed or performed during the hospital encounter of 07/12/16  Culture, blood (Routine x 2)     Status: Abnormal   Collection Time: 07/12/16  1:13 PM  Result Value Ref Range Status   Specimen Description BLOOD LEFT ANTECUBITAL  Final   Special Requests BOTTLES DRAWN AEROBIC AND ANAEROBIC 5CC  Final   Culture  Setup Time   Final    GRAM POSITIVE COCCI IN CLUSTERS ANAEROBIC BOTTLE ONLY CRITICAL RESULT CALLED TO, READ BACK BY AND VERIFIED WITH: C. Shade Pharm.D. 15:15 07/13/16 ([email or web address])    Culture (A)  Final    STAPHYLOCOCCUS SPECIES (COAGULASE NEGATIVE) THE SIGNIFICANCE OF ISOLATING THIS ORGANISM FROM A SINGLE SET OF BLOOD CULTURES WHEN MULTIPLE SETS ARE DRAWN IS UNCERTAIN. PLEASE NOTIFY THE MICROBIOLOGY DEPARTMENT WITHIN ONE WEEK IF SPECIATION AND SENSITIVITIES ARE REQUIRED. Performed at [facility], [place] [address] [place],  [address]    Report Status 07/16/2016 FINAL  Final  Culture, blood (Routine x 2)     Status: None   Collection Time: 07/12/16  1:13 PM  Result Value Ref Range Status   Specimen Description BLOOD LEFT ANTECUBITAL  Final   Special Requests BOTTLES DRAWN AEROBIC AND ANAEROBIC 5CC  Final   Culture   Final    NO GROWTH 5 DAYS Performed at [facility], [place] [address] [place],  [address]    Report Status 07/17/2016 FINAL  Final  Blood Culture ID Panel (Reflexed)     Status: Abnormal   Collection Time: 07/12/16  1:13 PM  Result Value Ref Range Status   Enterococcus species NOT DETECTED NOT DETECTED Final   Listeria monocytogenes NOT DETECTED NOT DETECTED Final   Staphylococcus species DETECTED (A) NOT DETECTED Final    Comment: Methicillin (oxacillin) susceptible coagulase negative staphylococcus. Possible blood culture contaminant (unless isolated from more than one blood culture draw or clinical case suggests pathogenicity). No antibiotic treatment is indicated for blood  culture contaminants. CRITICAL RESULT CALLED TO, READ BACK BY AND VERIFIED WITH: C. Shade Pharm.D. 15:15 07/13/16 ([email or web address])    Staphylococcus aureus NOT DETECTED NOT DETECTED Final   Methicillin resistance NOT DETECTED NOT DETECTED Final   Streptococcus species NOT DETECTED NOT DETECTED Final   Streptococcus agalactiae NOT DETECTED NOT DETECTED Final   Streptococcus pneumoniae NOT DETECTED  NOT DETECTED Final   Streptococcus pyogenes NOT DETECTED NOT DETECTED Final   Acinetobacter baumannii NOT DETECTED NOT DETECTED Final   Enterobacteriaceae species NOT DETECTED NOT DETECTED Final   Enterobacter cloacae complex NOT DETECTED NOT DETECTED Final   Escherichia coli NOT DETECTED NOT DETECTED Final   Klebsiella oxytoca NOT DETECTED NOT DETECTED Final   Klebsiella pneumoniae NOT DETECTED NOT DETECTED Final   Proteus species NOT DETECTED NOT DETECTED Final   Serratia  marcescens NOT DETECTED NOT DETECTED Final   Haemophilus influenzae NOT DETECTED NOT DETECTED Final   Neisseria meningitidis NOT DETECTED NOT DETECTED Final   Pseudomonas aeruginosa NOT DETECTED NOT DETECTED Final   Candida albicans NOT DETECTED NOT DETECTED Final   Candida glabrata NOT DETECTED NOT DETECTED Final   Candida krusei NOT DETECTED NOT DETECTED Final   Candida parapsilosis NOT DETECTED NOT DETECTED Final   Candida tropicalis NOT DETECTED NOT DETECTED Final    Comment: Performed at [facility], [place] [address] [place], [place] [address]  Urine culture     Status: Abnormal   Collection Time: 07/12/16  5:33 PM  Result Value Ref Range Status   Specimen Description OTHER  Final   Special Requests NONE  Final   Culture MULTIPLE SPECIES PRESENT, SUGGEST RECOLLECTION (A)  Final   Report Status 07/14/2016 FINAL  Final  MRSA PCR Screening     Status: None   Collection Time: 07/12/16  8:53 PM  Result Value Ref Range Status   MRSA by PCR NEGATIVE NEGATIVE Final    Comment:        The GeneXpert MRSA Assay (FDA approved for NASAL specimens only), is one component of a comprehensive MRSA colonization surveillance program. It is not intended to diagnose MRSA infection nor to guide or monitor treatment for MRSA infections.   Culture, blood (routine x 2)     Status: None   Collection Time: 07/19/16  8:02 AM  Result Value Ref Range Status   Specimen Description BLOOD RIGHT HAND  Final   Special Requests BOTTLES DRAWN AEROBIC ONLY 5CC  Final   Culture   Final    NO GROWTH 5 DAYS Performed at [facility], [address], [place], [place] [address]    Report Status 07/24/2016 FINAL  Final  Culture, blood (routine x 2)     Status: None   Collection Time: 07/19/16  8:04 AM  Result Value Ref Range Status   Specimen Description BLOOD LEFT HAND  Final   Special Requests IN PEDIATRIC BOTTLE 3CC  Final   Culture   Final    NO GROWTH 5 DAYS Performed at [facility], [place] [address] [place], [place] [address]    Report Status 07/24/2016 FINAL  Final    Studies/Results: Ct Abdomen Pelvis W Contrast  Result Date: 07/26/2016 CLINICAL DATA:  Pelvic fluid collections, fever. EXAM: CT ABDOMEN AND PELVIS WITH CONTRAST TECHNIQUE: Multidetector CT imaging of the abdomen and pelvis was performed using the standard protocol following bolus administration of intravenous contrast. CONTRAST:  138mL ISOVUE-300 IOPAMIDOL (ISOVUE-300) INJECTION 61% COMPARISON:  CT scan of July 15, 2016. FINDINGS: Lower chest: Minimal bilateral pleural effusions are noted with adjacent subsegmental atelectasis. Hepatobiliary: No gallstones are noted. Small hepatic cysts are noted. Pancreas: Unremarkable. No pancreatic ductal dilatation or surrounding inflammatory changes. Spleen: Normal in size without focal abnormality. Adrenals/Urinary Tract: Adrenal glands appear normal. Bilateral nephrostomy catheters are noted in grossly good position. 18 mm nonobstructive calculus is noted in  upper pole collecting system of left kidney. 17 mm calculus is noted in left renal pelvis. Moderate left hydronephrosis is noted secondary to 16 x 7 mm calculus in distal left ureter. Urinary bladder is decompressed secondary to Foley catheter. Stomach/Bowel: There is no evidence of bowel obstruction. The appendix appears normal. Vascular/Lymphatic: Left retroperitoneal lymph nodes are again noted which are improved compared to prior exam, with the largest measuring 9 mm. Reproductive: Stable large fibroid uterus is noted. Ovaries are unremarkable. Other: The fluid collections seen anteriorly in the pelvis on the prior exam appear to have nearly completely resolved. Small residual fluid collection measuring 4 mm is noted anteriorly in the left. Musculoskeletal: No acute or significant osseous findings. IMPRESSION: Bilateral nephrostomy tubes are in grossly good position. Urinary bladder is decompressed secondary to  Foley catheter. Large nonobstructive calculi are noted in the upper pole collecting system of left kidney and left renal pelvis. Moderate left hydronephrosis is noted secondary to 16 x 7 mm calculus in distal left ureter. Stable large fibroid uterus. Left retroperitoneal adenopathy is again noted, but improved compared to prior exam. Fluid collections seen anteriorly in the pelvis on the prior exam appear to have nearly completely resolved. Electronically Signed   By: Marijo Conception, M.D.   On: 07/26/2016 11:22   Ct Image Guided Drainage By Percutaneous Catheter  Result Date: 07/27/2016 INDICATION: 56 year old with multiple sclerosis and bilateral nephrostomy tubes. Concern for a bladder perforation. Cystoscopy demonstrated bladder dome inflammation and urology requested placement of a suprapubic tube. EXAM: CT-GUIDED PLACEMENT OF SUPRAPUBIC BLADDER CATHETER COMPARISON:  None. MEDICATIONS: Patient was already on scheduled antibiotics. ANESTHESIA/SEDATION: Fentanyl 200 mcg IV; Versed 4.0 mg IV Moderate Sedation Time:  42 minutes The patient was continuously monitored during the procedure by the interventional radiology nurse under my direct supervision. CONTRAST:  None FLUOROSCOPY TIME:  None COMPLICATIONS: None immediate. PROCEDURE: Informed written consent was obtained from the patient after a thorough discussion of the procedural risks, benefits and alternatives. All questions were addressed. A timeout was performed prior to the initiation of the procedure. CT images through the pelvis were obtained. The urinary bladder was initially decompressed with a Foley catheter. As a result, 250 mL of sterile saline was instilled in the bladder through the Foley catheter. Once the bladder was adequately distended, the anterior pelvis was prepped and draped in a sterile fashion. Skin was anesthetized with 1% lidocaine. 18 gauge trocar needle was directed into the bladder with CT guidance and a stiff Amplatz wire was  placed. The tract was dilated to accommodate a 12 [place] multipurpose drain. The drain was easily advanced over the wire and reconstituted in the bladder. Catheter was sutured to the skin with suture and attached to a gravity bag. The Foley catheter balloon was deflated and the Foley catheter was removed without complication. FINDINGS: Initial images demonstrated a decompressed urinary bladder with a Foley catheter. Bladder was adequate distended with normal saline. Evidence for multiple gas-filled bladder diverticula but no definite bladder leak. 12 French drain was successfully advanced into the bladder. IMPRESSION: Successful placement of a CT-guided suprapubic bladder catheter. Electronically Signed   By: Markus Daft M.D.   On: 07/27/2016 12:06    Assessment/Plan:   Admitted w/ recurrent pelvic infection--bladder perf ? Etiol. Now drained w/ cp tube. Will need for the foreseeeable future  Left sided renal/ureteral stones. Multiple scheduled nephrolithotomies cancelled due to the above pelvic process. Cutrrently drained well w/ bilat pcn nephrostomy tubes. She is on for left  percutaneous nephrolithotomy for next Thur--? Leave in house until then.  Will follw. I'll order urine culturers through tubes before intervention   LOS: 16 days   Franchot Gallo M 07/28/2016, 6:15 AM

## 2016-07-28 NOTE — Progress Notes (Signed)
D/C summary faxed to facility [facility] called for transport. Scheduled for 3:30 transport.  Nurse given report #

## 2016-07-28 NOTE — Discharge Summary (Signed)
Physician Discharge Summary  [name] [id number] DOB: 01/11/1961 DOA: 07/12/2016  PCP: Blanchie Serve, MD  Admit date: 07/12/2016 Discharge date: 07/28/2016  Admitted From: SNF Disposition:  SNF at [facility]  Recommendations for Outpatient Follow-up:  1. Follow up with PCP in 1-2 weeks 2. Follow up with Urology as an outpatient; Has appointment next Thursday with Dr. Diona Fanti for Left Percutaneous Nephrolithotomy  3. Repeat CXR in 6 weeks 4. Repeat Iron Infusion as an outpatient and start po Iron 5. Follow up with [facility] as an outpatient 6. Please obtain BMP/CBC in one week 7. Please follow up on the following pending results: Urine Cultures from Nephrostomy tubes and suprapubic catheters  Home Health: No Equipment/Devices: None  Discharge Condition: Stable CODE STATUS: FULL CODE Diet recommendation: Heart Healthy / Carb Modified   Brief/Interim Summary: [name] a 56 yo female with past medical history of multiple sclerosis, history of DVT on Xarelto, hx of bilateral obstructing nephrolithiasis s/p bilateral perc nephrostomy tube in July 2017 and December 2017, who presented to the emergency department due to fevers. She was encephalopathic at time of admission. In the emergency department, she had a fever of 103, hypotensive, lactic acidosis, and hemoglobin of 4. She was initially admitted to [facility] services, with presumed sepsis secondary to UTI. She was treated with IV antibiotics, given blood products. She did require pressors for blood pressure support. She was transferred to [facility] with stabilization of BP on 07/15/16.  CT abdomen/pelvis was obtained as patient continued with leukocytosis. This showed multiloculated pelvic fluid collection ? From bladder perforation. General surgery as well as urology were consulted. General surgery did not feel this was related to her bowel and has since signed off. Urology feels this is likely pelvic abscess vs  urinoma. They recommend nephrostomy tubes bilaterally (these were in place prior to this admission) as well as foley in order to allow drainage of urine away from the bladder to allow healing.   On evening of 2/5, she started to spike fevers with respiratory complaints. CXR obtained showed LLL infiltrate. GI consulted given positive FOBT, low Hgb on admission and need for anticoagulation. EGD and colonoscopy unrevealing source of bleeding. Hgb has remained stable. Patient with almost complete resolution of symptoms from PNA and underwent Suprapubic catheter placed by IR 07/27/16. Repeat CT showed almost complete resolution of Abscess. Patient was at this time deemed stable for D/C and her shock has resolved. She will under go Left Percutaneous Nephrolithotomy on 08/04/16 and will hold her Xarelto until after procedure. She will need repeat CXR as an outpatient.   Discharge Diagnoses:  Principal Problem:   Septic shock ([place]) Active Problems:   Multiple sclerosis diagnosed 2002-not on Therapy any longer   DVT, lower extremity (HCC)   Chronic indwelling Foley catheter   AKI (acute kidney injury) ([place])   Controlled diabetes mellitus type 2 with complications ([place])   Pelvic abscess in female   Acute metabolic encephalopathy   Anemia of chronic disease   Essential hypertension  Shock, multifactorial: Sepsis, improved -Septic as well as hemorrhagic -Required levophed, her pressure is now stable off pressors -Initially thought source to be secondary to UTI/ Recurrent Pelvic infection with ? Bladder Perforation. She was started on IV Vanco and Zosyn, but urine culture has now resulted with multiple organisms, UA does show large leuk, numerous WBC, many bacteria. Previous urine culture has shown pan-sensitive E Coli and Pseudomonas, resistant providencia  -CT renal stone on 1/30 with questionable cystitis, questionable  diverticulitis or colitis  -Due to diarrhea, C. difficile PCR was ordered as well as  oral vancomycin. However, before sample could be obtained, she stopped having diarrhea. Therefore PCR test, enteric precaution, oral vanco are stopped.  -Blood culture with 1 of 2 coag neg staph, likely contaminant  -Influenza negative  -CT abd/pelvis obtained due to continued leukocytosis, this showed multiloculated pelvic fluid collection  -Urology consulted: likely pelvic abscess vs urinoma. Continue nephrostomy tubes and foley to allow drainage of urine away from bladder for healing. -IR consulted by Urology  SP tube placement; repeat CT abd/pelvis demonstrating almost complete resolution of abscess. Suprapubic Catheter placed today 07/27/2016 -Urology followed and patient to undergo Left Percutaneous Nephrolithotomy 08/04/2016 -D/C'dcontinue narrowed abx's by mouth Augmentin (finished course). -Repeat blood cultures showed NGTD at 5 days -Obtained Urine Cx from nephrostomy tubes and suprapubic catheter that will need to be followed on -also treated HCAP see below -sepsis physiology features resolved. Hgb (7.8) and VS remains stable.  LLL HCAP -Respiratory complaints started 2/5.  -Re-started vanco along with zosyn; which was given for 3 days. Transitioned to Augmentin to complete therapy and D/C'd yesterday -patient afebrile for 72 hours now and with WBC's back to normal. -tolerating good O2 sat on RA now -continue PRN duoneb -Continue home Albuterol Nebs q6hprn for wheezing or SOB and flutter valve  -C/w Hycodan 5 mL q6hprn Cough -Repeat CXR in 6 weeks  Hypokalemia -Patient's K+ was 3.3this AM -Replete with po and IV KCl -Repeat CMP at SNF  Hypomagnesemia -Patient' Mag Level was 1.4 -Replete with IV Mag Sulfate -Repeat Mag at SNF  Acute metabolic encephalopathy (most likely toxic) -Now resolved and at baseline -most likely associated with fever and acute infection.  -Continue to montior closely  Acute on chronic normocytic anemia -Baseline Hgb 8.2-9.5 -Unclear  etiology, had hemoccult positive stool but no report of melena or hematochezia to explain Hgb of 4 on admission  -Received 2u pRBC on 1/30  -INR normal, LDH normal, fibrinogen elevated, Haptoglobin 308. Not consistent with hemolytic anemia or DIC  -Vit B 12: 2,254. Folate: 1,996 -Continue to trend CBC intermitently. Hgb 7.8(last checked on 2/15) -discussed with GI and after no signs of acute source of bleeding on EGD and colonoscopy done on 07/25/16. Recommendations are for IV iron and continue oral iron.  -If Hgb remains stable/rising w/o signs of bleeding, ok to resume anticoagulation but will wait until after Left Percutaneous Nephrolithotomy -IV Iron given 07/26/16 with Feurmoxytol 510 mg Iv; May have second dose in 3-8 days if needed  -no signs of acute bleeding currently -Repeat CBC in AM  AKI  -Baseline Cr 0.5-0.7, Cr was 1.75 on admission -Resolved with IVF  -BUN/Cr stable at 5/0.60 -Repeat BMP at SNF  History of DVT -Xarelto was held on admission due to Anemia -Currently on SCDs; Had Suprapubic Catheter placed yesterday AM  -no signs of acute bleeding currently -Discussed with Urology Dr. 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 -  Not on any therapy any more  Discharge Instructions  Discharge Instructions    Call MD for:  difficulty breathing, headache  or visual disturbances    Complete by:  As directed    Call MD for:  persistant dizziness or light-headedness    Complete by:  As directed    Call MD for:  persistant nausea and vomiting    Complete by:  As directed    Call MD for:  redness, tenderness, or signs of infection (pain, swelling, redness, odor or green/yellow discharge around incision site)    Complete by:  As directed    Call MD for:  severe uncontrolled pain    Complete by:  As directed    Call MD for:  temperature >100.4    Complete by:  As directed    Diet - low sodium heart healthy    Complete by:  As directed    Discharge instructions    Complete by:  As directed    Follow up Care at SNF   Increase activity slowly    Complete by:  As directed      Allergies as of 07/28/2016   No Known Allergies     Medication List    STOP taking these medications   furosemide 40 MG tablet Commonly known as:  LASIX     TAKE these medications   acetaminophen 325 MG tablet Commonly known as:  TYLENOL Take 2 tablets (650 mg total) by mouth every 6 (six) hours as needed for fever.   albuterol (2.5 MG/3ML) 0.083% nebulizer solution Commonly known as:  PROVENTIL Take 3 mLs (2.5 mg total) by nebulization every 6 (six) hours as needed for wheezing or shortness of breath.   atorvastatin 10 MG tablet Commonly known as:  LIPITOR Take 10 mg by mouth at bedtime.   famotidine 20 MG tablet Commonly known as:  PEPCID Take 1 tablet (20 mg total) by mouth 2 (two) times daily.   feeding supplement Liqd Take 1 Container by mouth 2 (two) times daily between meals.   gabapentin 100 MG capsule Commonly known as:  NEURONTIN Take 1 capsule (100 mg total) by mouth at bedtime.   glipiZIDE 5 MG tablet Commonly known as:  GLUCOTROL Take 5 mg by mouth daily before breakfast.   hydrocerin Crea Apply 1 application topically 2 (two) times daily.   HYDROcodone-homatropine 5-1.5 MG/5ML syrup Commonly known as:  HYCODAN Take 5 mLs by  mouth every 6 (six) hours as needed for cough.   lisinopril 2.5 MG tablet Commonly known as:  PRINIVIL,ZESTRIL Take 2.5 mg by mouth daily.   metFORMIN 500 MG tablet Commonly known as:  GLUCOPHAGE Take 0.5 tablets (250 mg total) by mouth 2 (two) times daily.   multivitamin with minerals Tabs tablet Take 1 tablet by mouth daily.   nystatin cream Commonly known as:  MYCOSTATIN Apply 1 application topically 2 (two) times daily.   omega-3 acid ethyl esters 1 g capsule Commonly known as:  LOVAZA Take 1 g by mouth daily.   polyethylene glycol packet Commonly known as:  MIRALAX / GLYCOLAX Take 17 g by mouth 2 (two) times daily.   potassium chloride 10 MEQ tablet Commonly known as:  K-DUR,KLOR-CON Take 1 tablet (10 mEq total) by mouth daily.      Contact information for after-discharge care    Destination    [name] .   Specialty:  [place] information: [address] [place] [phone]  No Known Allergies  Consultations:  PCCM  General Surgery  Urology  Gastroenterology  Interventional Radiology  Procedures/Studies: Ct Abdomen Pelvis W Contrast  Result Date: 07/26/2016 CLINICAL DATA:  Pelvic fluid collections, fever. EXAM: CT ABDOMEN AND PELVIS WITH CONTRAST TECHNIQUE: Multidetector CT imaging of the abdomen and pelvis was performed using the standard protocol following bolus administration of intravenous contrast. CONTRAST:  116mL ISOVUE-300 IOPAMIDOL (ISOVUE-300) INJECTION 61% COMPARISON:  CT scan of July 15, 2016. FINDINGS: Lower chest: Minimal bilateral pleural effusions are noted with adjacent subsegmental atelectasis. Hepatobiliary: No gallstones are noted. Small hepatic cysts are noted. Pancreas: Unremarkable. No pancreatic ductal dilatation or surrounding inflammatory changes. Spleen: Normal in size without focal abnormality. Adrenals/Urinary Tract: Adrenal glands  appear normal. Bilateral nephrostomy catheters are noted in grossly good position. 18 mm nonobstructive calculus is noted in upper pole collecting system of left kidney. 17 mm calculus is noted in left renal pelvis. Moderate left hydronephrosis is noted secondary to 16 x 7 mm calculus in distal left ureter. Urinary bladder is decompressed secondary to Foley catheter. Stomach/Bowel: There is no evidence of bowel obstruction. The appendix appears normal. Vascular/Lymphatic: Left retroperitoneal lymph nodes are again noted which are improved compared to prior exam, with the largest measuring 9 mm. Reproductive: Stable large fibroid uterus is noted. Ovaries are unremarkable. Other: The fluid collections seen anteriorly in the pelvis on the prior exam appear to have nearly completely resolved. Small residual fluid collection measuring 4 mm is noted anteriorly in the left. Musculoskeletal: No acute or significant osseous findings. IMPRESSION: Bilateral nephrostomy tubes are in grossly good position. Urinary bladder is decompressed secondary to Foley catheter. Large nonobstructive calculi are noted in the upper pole collecting system of left kidney and left renal pelvis. Moderate left hydronephrosis is noted secondary to 16 x 7 mm calculus in distal left ureter. Stable large fibroid uterus. Left retroperitoneal adenopathy is again noted, but improved compared to prior exam. Fluid collections seen anteriorly in the pelvis on the prior exam appear to have nearly completely resolved. Electronically Signed   By: Marijo Conception, M.D.   On: 07/26/2016 11:22   Ct Abdomen Pelvis W Contrast  Result Date: 07/15/2016 CLINICAL DATA:  Acute onset of fever, hypotension, lactic acidosis and decreased hemoglobin. Initial encounter. EXAM: CT ABDOMEN AND PELVIS WITH CONTRAST TECHNIQUE: Multidetector CT imaging of the abdomen and pelvis was performed using the standard protocol following bolus administration of intravenous contrast.  CONTRAST:  165mL ISOVUE-300 IOPAMIDOL (ISOVUE-300) INJECTION 61% COMPARISON:  CT of the abdomen and pelvis from 07/12/2016 FINDINGS: Lower chest: Trace bilateral pleural effusions are noted, with associated atelectasis. Trace pericardial fluid remains within normal limits. Hepatobiliary: A 5 mm hypodensity is noted in the periphery of the right hepatic lobe. The liver is otherwise unremarkable. The gallbladder is relatively decompressed and grossly unremarkable in appearance. The common bile duct remains normal in caliber. Pancreas: The pancreas is within normal limits. Spleen: The spleen is unremarkable in appearance. Adrenals/Urinary Tract: There is moderate chronic right-sided hydronephrosis despite right-sided nephrostomy tube placement, similar in appearance to the prior study. The nephrostomy tubes are stable in appearance. Chronic inflammation is noted about the left renal pelvis. Multiple left renal cysts are seen. Nonspecific perinephric stranding and fluid are noted bilaterally. Evaluation for renal stones is limited given contrast in the left renal calyces. No obstructing ureteral stones are identified. Stomach/Bowel: The appendix is grossly unremarkable in appearance. The degree of wall thickening and soft tissue inflammation about the proximal sigmoid colon is mildly  improved from the prior study. No definite mass was seen in this region on earlier studies in 2017. This is concerning for acute diverticulitis, with diffuse surrounding soft tissue inflammation. New collections of fluid are seen tracking anterior to the bladder, with minimal associated foci of fat attenuation and diffuse surrounding soft tissue inflammation. Collections of fluid measure approximately 8.4 x 2.9 x 4.9 cm, and raise concern for evolving abscess. A colonic source is considered more likely. Minimal soft tissue inflammation is noted at the mid transverse colon, reflecting the infectious process within the pelvis. The small bowel  is grossly unremarkable in appearance. The stomach is unremarkable. Vascular/Lymphatic: Minimal calcification is seen along the distal abdominal aorta and its branches. Mild mural thrombus is seen along the distal abdominal aorta. Prominent retroperitoneal nodes measure up to 1.6 cm in short axis. Prominent pelvic sidewall nodes measure up to 1.1 cm in short axis. This may reflect acute infection or possibly underlying metastatic disease. Reproductive: Marked soft tissue inflammation is noted about the anterior bladder, with multiple underlying bladder wall diverticula. A Foley catheter is noted. Multiple uterine fibroids are seen. No suspicious adnexal masses are identified, though the ovaries are difficult to fully assess given the infectious process within the pelvis. Other: No additional soft tissue abnormalities are seen. Musculoskeletal: No acute osseous abnormalities are identified. The visualized musculature is unremarkable in appearance. IMPRESSION: 1. New collections of fluid tracking anterior to the bladder, measuring approximately 8.4 x 2.9 x 4.9 cm, with diffuse surrounding soft tissue inflammation and minimal associated foci of fat attenuation. This raises concern for evolving abscess within the anterior pelvis, in similar distribution to prior regions of phlegmon noted earlier in 2017. A colonic source is considered more likely. 2. Slight interval improvement in the appearance of wall thickening and soft tissue inflammation about the proximal sigmoid colon, still concerning for acute diverticulitis. Diffuse surrounding soft tissue inflammation again noted. 3. Moderate chronic right-sided hydronephrosis again noted, despite right-sided nephrostomy tube placement. The nephrostomy tubes are stable in appearance. Chronic inflammation about the left renal pelvis. 4. Prominent pelvic sidewall and retroperitoneal nodes, measuring up to 1.6 cm in short axis. This may reflect acute infection or possibly  underlying metastatic disease. Further evaluation may be considered as deemed clinically appropriate. 5. Nonspecific 5 mm hypodensity in the periphery of the right hepatic lobe. 6. Trace bilateral pleural effusions, with associated atelectasis. 7. Mild mural thrombus along the distal abdominal aorta, without evidence of luminal narrowing. 8. Multiple uterine fibroids again noted. Electronically Signed   By: Garald Balding M.D.   On: 07/15/2016 18:11   Dg Chest Port 1 View  Result Date: 07/18/2016 CLINICAL DATA:  Shortness of breath.  Wheezing.  Fever. EXAM: PORTABLE CHEST 1 VIEW COMPARISON:  July 14, 2016 FINDINGS: There is patchy airspace consolidation in the left mid lower lung zones with small left pleural effusion. Right lung is clear. Heart is upper normal in size with pulmonary vascularity within normal limits. No adenopathy. Central catheter tip is in the superior vena cava. No pneumothorax. There is a pigtail type catheter in the left upper quadrant region, incompletely visualized. IMPRESSION: Patchy infiltrate left mid lower lung zones with small left pleural effusion. Lungs elsewhere clear. Stable cardiac silhouette. Electronically Signed   By: Lowella Grip III M.D.   On: 07/18/2016 17:45   Dg Chest Port 1 View  Result Date: 07/14/2016 CLINICAL DATA:  Respiratory failure. EXAM: PORTABLE CHEST 1 VIEW COMPARISON:  07/12/2016 . FINDINGS: Right IJ In stable position.  Heart size stable. Bibasilar subsegmental atelectasis. No pleural effusion or pneumothorax. IMPRESSION: 1. Right IJ line stable position. 2. Mild bibasilar subsegmental atelectasis. Electronically Signed   By: Marcello Moores  Register   On: 07/14/2016 07:15   Dg Chest Portable 1 View  Result Date: 07/12/2016 CLINICAL DATA:  Central line placement.  Initial encounter. EXAM: PORTABLE CHEST 1 VIEW COMPARISON:  Chest radiograph performed earlier today at 1:05 p.m. FINDINGS: The patient's right IJ line is noted ending about the mid to distal  SVC. The lungs are mildly hypoexpanded. Mild vascular crowding and vascular congestion are seen. No pleural effusion or pneumothorax is identified. The cardiomediastinal silhouette remains normal in size. No acute osseous abnormalities are identified. IMPRESSION: 1. Right IJ line noted ending about the mid to distal SVC. 2. Lungs mildly hypoexpanded but grossly clear. Mild vascular congestion noted. Electronically Signed   By: Garald Balding M.D.   On: 07/12/2016 17:45   Dg Chest Port 1 View  Result Date: 07/12/2016 CLINICAL DATA:  Shortness of breath today.  Hypertension. EXAM: PORTABLE CHEST 1 VIEW COMPARISON:  05/07/2016 FINDINGS: Normal heart, mediastinum and hila. Lungs are clear. No pleural effusion or pneumothorax. Skeletal structures are unremarkable. IMPRESSION: No active disease. Electronically Signed   By: Lajean Manes M.D.   On: 07/12/2016 13:25   Ct Renal Stone Study  Result Date: 07/12/2016 CLINICAL DATA:  Sepsis, nephrostomy tubes, follow, Ensure per pre placement, history diabetes mellitus, multiple sclerosis, acute on chronic renal failure, stroke, former smoker EXAM: CT ABDOMEN AND PELVIS WITHOUT CONTRAST TECHNIQUE: Multidetector CT imaging of the abdomen and pelvis was performed following the standard protocol without IV contrast. Sagittal and coronal MPR images reconstructed from axial data set. GI contrast was not administered. Beam hardening artifacts in the upper abdomen from inclusion of the patient's arms in the imaged field COMPARISON:  None. FINDINGS: Lower chest: Minimal dependent bibasilar atelectasis. Hepatobiliary: Gallbladder and liver grossly unremarkable Pancreas: Normal appearance Spleen: Normal appearance Adrenals/Urinary Tract: Thickened adrenal glands without mass. BILATERALnephrostomy tubes present at the renal pelves. Large cyst lateral LEFT kidney 3.3 x 2.6 cm image 38. Mild LEFT renal collecting system dilatation. Moderate RIGHT renal collecting system dilatation.  Nonobstructing LEFT renal calculi, 17 mm upper pole coronal image 80 and 18 mm renal pelvis coronal image 77. Dilatation of RIGHT ureter to the bladder. LEFT ureter decompressed with unchanged visualization of a 10 mm LEFT ureteral calculus image 77. No definite RIGHT ureteral calculus. Diffusely thickened and irregular bladder wall with small RIGHT lateral bladder diverticulum. Stomach/Bowel: Normal appendix. Thickened sigmoid colon with infiltrative changes of the sigmoid mesocolon, favor inflammatory process such as sigmoid colitis or diverticulitis. 13 mm diameter fluid levelcollection anterior to the urinary bladder, uncertain if represents a tiny bladder diverticulum containing air or a tiny extraluminal collection arising from a sigmoid colonic inflammatory process. Stomach and remaining bowel loops unremarkable. Vascular/Lymphatic: Atherosclerotic calcification without aneurysm. Upper normal size retroperitoneal nodes without abdominal or pelvic adenopathy. Reproductive: Unremarkable uterus and ovaries. Other: No free air or ascites.  No hernia. Musculoskeletal: Osseous demineralization diffusely. IMPRESSION: Expected position of the pigtails of the BILATERAL nephrostomy tubes within the renal collecting systems, with RIGHT greater than LEFT collecting system dilatation of the kidneys. Nonobstructing LEFT renal calculi and 10 mm distal LEFT ureteral calculus. Thick irregular bladder wall with small bladder diverticulum, question of cystitis raised. Thickened segment of sigmoid colon with pericolic inflammatory changes question due to diverticulitis or colitis. 13 x 16 mm diameter extraluminal fat/fluid level anterior to the urinary  bladder, nonspecific. Electronically Signed   By: Lavonia Dana M.D.   On: 07/12/2016 15:44   Ir Nephrostomy Exchange Left  Result Date: 07/07/2016 INDICATION: 56 year old female with multiple sclerosis, left-sided nephrolithiasis and history of prior left-sided ureteral  obstruction secondary to a distal ureteral stone. Additionally, there was some concern for bladder perforation or urine leak and a right-sided nephrostomy tube was placed for maximal urinary diversion on 05/13/2016. She presents today for exchange of both nephrostomy tubes. EXAM: IR EXCHANGE NEPHROSTOMY LEFT; IR EXCHANGE NEPHROSTOMY RIGHT COMPARISON:  Most recent prior CT imaging 06/09/2016 MEDICATIONS: None. ANESTHESIA/SEDATION: None. CONTRAST:  Approximately 20 mL - administered into the collecting system(s) FLUOROSCOPY TIME:  Fluoroscopy Time: 2 minutes 6 seconds (11.9 mGy). COMPLICATIONS: None immediate. PROCEDURE: Informed written consent was obtained from the patient after a thorough discussion of the procedural risks, benefits and alternatives. All questions were addressed. Maximal Sterile Barrier Technique was utilized including caps, mask, sterile gowns, sterile gloves, sterile drape, hand hygiene and skin antiseptic. A timeout was performed prior to the initiation of the procedure. Attention was first turned to the existing left-sided percutaneous nephrostomy tube. A hand injection of contrast material was performed. The tube is nearly occluded. With some effort, contrast was injected into the renal collecting system. The drainage catheter is located in the upper pole infundibulum in the region of a large renal stone. There is no evidence of hydronephrosis. Contrast material drains through the ureter, around a nonobstructing distal ureteral stone and into the bladder. The tube was cut and removed over a stiff Glidewire. A new Cook 10.2 [place] multipurpose drainage catheter was advanced over the wire and positioned with the locking loop in the renal pelvis. A gentle hand injection of contrast material and a fluoroscopic saved image confirms tube location. Attention was next turned to the right nephrostomy tube. A gentle hand injection of contrast material opacifies the right renal collecting system. The  right-sided tube is not clogged. Again, the tube lies in an upper pole infundibulum. The ureter is widely patent and contrast material drains into the bladder. There is no evidence of hydronephrosis. No filling defects to suggest nephrolithiasis. The tube was cut and removed over a Bentson wire. A new Cook 10.2 French drainage catheter was advanced over the wire and formed in the renal pelvis. The left-sided tube was connected to gravity bag drainage while the right-sided tube was capped. Both tubes were held in place with adhesive retention devices and bandages. Sutures were deferred given skin breakdown and granulation tissue formation bilaterally. IMPRESSION: 1. Left-sided upper pole nephrolithiasis and distal nonobstructing ureteral stone. 2. Successful exchange of indwelling 10.2 French percutaneous nephrostomy tube. Patient will be undergoing left-sided PCNL in the next several weeks to months. 3. Patent right renal collecting system. No evidence of ureteral obstruction or hydronephrosis. 4. Successful exchange of right 10.2 French percutaneous nephrostomy tube. This tube was left capped to initiate a clinical trial prior to tube removal. This was discussed with her attending nephrologist, Dr. Franchot Gallo, via telephone at the time of the procedure. He is in agreement with this plan. PLAN: Return to interventional radiology in 2 weeks for clinical evaluation of the right-sided nephrostomy tube and repeat nephrostogram. If the patient is asymptomatic and the right ureter remains open, the nephrostomy tube can be removed at that time. Signed, Criselda Peaches, MD [facility] Radiology Electronically Signed   By: Jacqulynn Cadet M.D.   On: 07/07/2016 15:04   Ir Nephrostomy Exchange Right  Result Date: 07/07/2016 INDICATION: 56 year old female with multiple sclerosis, left-sided nephrolithiasis and history of prior left-sided ureteral obstruction  secondary to a distal ureteral stone. Additionally, there was some concern for bladder perforation or urine leak and a right-sided nephrostomy tube was placed for maximal urinary diversion on 05/13/2016. She presents today for exchange of both nephrostomy tubes. EXAM: IR EXCHANGE NEPHROSTOMY LEFT; IR EXCHANGE NEPHROSTOMY RIGHT COMPARISON:  Most recent prior CT imaging 06/09/2016 MEDICATIONS: None. ANESTHESIA/SEDATION: None. CONTRAST:  Approximately 20 mL - administered into the collecting system(s) FLUOROSCOPY TIME:  Fluoroscopy Time: 2 minutes 6 seconds (11.9 mGy). COMPLICATIONS: None immediate. PROCEDURE: Informed written consent was obtained from the patient after a thorough discussion of the procedural risks, benefits and alternatives. All questions were addressed. Maximal Sterile Barrier Technique was utilized including caps, mask, sterile gowns, sterile gloves, sterile drape, hand hygiene and skin antiseptic. A timeout was performed prior to the initiation of the procedure. Attention was first turned to the existing left-sided percutaneous nephrostomy tube. A hand injection of contrast material was performed. The tube is nearly occluded. With some effort, contrast was injected into the renal collecting system. The drainage catheter is located in the upper pole infundibulum in the region of a large renal stone. There is no evidence of hydronephrosis. Contrast material drains through the ureter, around a nonobstructing distal ureteral stone and into the bladder. The tube was cut and removed over a stiff Glidewire. A new Cook 10.2 [place] multipurpose drainage catheter was advanced over the wire and positioned with the locking loop in the renal pelvis. A gentle hand injection of contrast material and a fluoroscopic saved image confirms tube location. Attention was next turned to the right nephrostomy tube. A gentle hand injection of contrast material opacifies the right renal collecting system. The right-sided  tube is not clogged. Again, the tube lies in an upper pole infundibulum. The ureter is widely patent and contrast material drains into the bladder. There is no evidence of hydronephrosis. No filling defects to suggest nephrolithiasis. The tube was cut and removed over a Bentson wire. A new Cook 10.2 French drainage catheter was advanced over the wire and formed in the renal pelvis. The left-sided tube was connected to gravity bag drainage while the right-sided tube was capped. Both tubes were held in place with adhesive retention devices and bandages. Sutures were deferred given skin breakdown and granulation tissue formation bilaterally. IMPRESSION: 1. Left-sided upper pole nephrolithiasis and distal nonobstructing ureteral stone. 2. Successful exchange of indwelling 10.2 French percutaneous nephrostomy tube. Patient will be undergoing left-sided PCNL in the next several weeks to months. 3. Patent right renal collecting system. No evidence of ureteral obstruction or hydronephrosis. 4. Successful exchange of right 10.2 French percutaneous nephrostomy tube. This tube was left capped to initiate a clinical trial prior to tube removal. This was discussed with her attending nephrologist, Dr. Franchot Gallo, via telephone at the time of the procedure. He is in agreement with this plan. PLAN: Return to interventional radiology in 2 weeks for clinical evaluation of the right-sided nephrostomy tube and repeat nephrostogram. If the patient is asymptomatic and the right ureter remains open, the nephrostomy tube can be removed at that time. Signed, Criselda Peaches, MD [facility] Radiology Electronically Signed   By: Jacqulynn Cadet M.D.   On: 07/07/2016 15:04   Ct Image Guided Drainage By Percutaneous Catheter  Result Date: 07/27/2016 INDICATION: 56 year old with multiple sclerosis and bilateral nephrostomy tubes. Concern for a bladder perforation. Cystoscopy  demonstrated bladder dome inflammation and urology requested placement of a suprapubic tube. EXAM: CT-GUIDED PLACEMENT OF SUPRAPUBIC BLADDER CATHETER COMPARISON:  None. MEDICATIONS: Patient was already on scheduled antibiotics. ANESTHESIA/SEDATION: Fentanyl 200 mcg IV; Versed 4.0 mg IV Moderate Sedation Time:  42 minutes The patient was continuously monitored during the procedure by the interventional radiology nurse under my direct supervision. CONTRAST:  None FLUOROSCOPY TIME:  None COMPLICATIONS: None immediate. PROCEDURE: Informed written consent was obtained from the patient after a thorough discussion of the procedural risks, benefits and alternatives. All questions were addressed. A timeout was performed prior to the initiation of the procedure. CT images through the pelvis were obtained. The urinary bladder was initially decompressed with a Foley catheter. As a result, 250 mL of sterile saline was instilled in the bladder through the Foley catheter. Once the bladder was adequately distended, the anterior pelvis was prepped and draped in a sterile fashion. Skin was anesthetized with 1% lidocaine. 18 gauge trocar needle was directed into the bladder with CT guidance and a stiff Amplatz wire was placed. The tract was dilated to accommodate a 12 [place] multipurpose drain. The drain was easily advanced over the wire and reconstituted in the bladder. Catheter was sutured to the skin with suture and attached to a gravity bag. The Foley catheter balloon was deflated and the Foley catheter was removed without complication. FINDINGS: Initial images demonstrated a decompressed urinary bladder with a Foley catheter. Bladder was adequate distended with normal saline. Evidence for multiple gas-filled bladder diverticula but no definite bladder leak. 12 French drain was successfully advanced into the bladder. IMPRESSION: Successful placement of a CT-guided suprapubic bladder catheter. Electronically Signed   By: Markus Daft  M.D.   On: 07/27/2016 12:06    EGD Findings:      The esophagus was normal.      The stomach was normal.      The examined duodenum was normal. Impression:               - Normal esophagus.                           - Normal stomach.                           - Normal examined duodenum.                           - No specimens collected.                           - Blood in stool without cause found on endoscopic                            exam  Colonoscopy Findings:      The perianal and digital rectal examinations were normal.      There is no endoscopic evidence of bleeding, diverticula, mass, polyps       or tumor in the entire colon. Impression:               - Preparation of the colon was unsatisfactory.                           - No specimens collected.                           -  Anemia-no etiology evident on endoscopic                            evaluation                           - Blood in stool without cause found on endoscopic                            exam. Due to the prep a small polyp or AVM could                            have been missed.  Suprapubic Cathter Placement CT guided suprapubic catheter was placed.  Bladder was initially decompressed.  Therefore, 250 ml of saline in infused through the foley catheter.  12 French tube was placed without complication.  Minimal blood loss.  Foley catheter was removed at end of procedure.  See full report in Imaging.   Subjective: Seen and examined at bedside and was improved. Had no abdominal pain. No Nausea or Vomiting. No other concerns or complaints.   Discharge Exam: Vitals:   07/27/16 2038 07/28/16 0440  BP: (!) 159/78 (!) 144/75  Pulse: 89 85  Resp: 18 20  Temp: 99 F (37.2 C) 99.2 F (37.3 C)   Vitals:   07/27/16 1111 07/27/16 1447 07/27/16 2038 07/28/16 0440  BP: (!) 147/71 (!) 141/73 (!) 159/78 (!) 144/75  Pulse: 77 81 89 85  Resp: 16 16 18 20   Temp: 97.8 F (36.6 C) 97.8 F (36.6 C) 99 F (37.2  C) 99.2 F (37.3 C)  TempSrc: Oral Oral Oral Oral  SpO2: 100% 100% 99% 94%  Weight:      Height:       General: Pt is alert, awake, not in acute distress Cardiovascular: RRR, S1/S2 +, no rubs, no gallops Respiratory: CTA bilaterally, no wheezing, no rhonchi Abdominal: Soft, NT, ND, bowel sounds +; Suprapubic Catheter in place without erythema Extremities: no edema, no cyanosis; Lower extremity discoloration noted Gu: Deferred. Bilateral Nephrostomy tubes draining well  The results of significant diagnostics from this hospitalization (including imaging, microbiology, ancillary and laboratory) are listed below for reference.    Microbiology: Recent Results (from the past 240 hour(s))  Culture, blood (routine x 2)     Status: None   Collection Time: 07/19/16  8:02 AM  Result Value Ref Range Status   Specimen Description BLOOD RIGHT HAND  Final   Special Requests BOTTLES DRAWN AEROBIC ONLY 5CC  Final   Culture   Final    NO GROWTH 5 DAYS Performed at [facility], [address], [place], [place] [address]    Report Status 07/24/2016 FINAL  Final  Culture, blood (routine x 2)     Status: None   Collection Time: 07/19/16  8:04 AM  Result Value Ref Range Status   Specimen Description BLOOD LEFT HAND  Final   Special Requests IN PEDIATRIC BOTTLE 3CC  Final   Culture   Final    NO GROWTH 5 DAYS Performed at [facility], [place] [address] [place], [place] [address]    Report Status 07/24/2016 FINAL  Final    Labs: BNP (last 3 results)  Recent Labs  12/18/15 1035  BNP [id number]   Basic Metabolic Panel:  Recent Labs Lab  07/22/16 0353 07/26/16 0519 07/27/16 0509 07/28/16 0435  NA 140 143 142 145  K 3.6 3.2* 3.2* 3.3*  CL 110 113* 113* 117*  CO2 26 26 25 23   GLUCOSE 148* 106* 105* 106*  BUN 7 8 7  5*  CREATININE 0.72 0.65 0.71 0.60  CALCIUM 8.8* 9.4 9.4 9.5  MG  --   --   --  1.4*  PHOS  --   --   --  2.5   Liver Function Tests:  Recent Labs Lab  07/28/16 0435  AST 11*  ALT 14  ALKPHOS 58  BILITOT 0.9  PROT 7.1  ALBUMIN 2.4*   No results for input(s): LIPASE, AMYLASE in the last 168 hours. No results for input(s): AMMONIA in the last 168 hours. CBC:  Recent Labs Lab 07/23/16 0905 07/26/16 0519 07/27/16 0509 07/28/16 0435  WBC 14.0* 10.8* 8.9 9.4  NEUTROABS  --   --   --  5.2  HGB 7.9* 7.6* 7.6* 7.8*  HCT 24.6* 23.9* 23.5* 24.3*  MCV 89.5 87.9 87.7 85.6  PLT 425* 390 389 377   Cardiac Enzymes: No results for input(s): CKTOTAL, CKMB, CKMBINDEX, TROPONINI in the last 168 hours. BNP: Invalid input(s): POCBNP CBG:  Recent Labs Lab 07/27/16 1210 07/27/16 1645 07/27/16 2042 07/28/16 0734 07/28/16 1127  GLUCAP 172* 90 95 94 186*   D-Dimer No results for input(s): DDIMER in the last 72 hours. Hgb A1c No results for input(s): HGBA1C in the last 72 hours. Lipid Profile No results for input(s): CHOL, HDL, LDLCALC, TRIG, CHOLHDL, LDLDIRECT in the last 72 hours. Thyroid function studies No results for input(s): TSH, T4TOTAL, T3FREE, THYROIDAB in the last 72 hours.  Invalid input(s): FREET3 Anemia work up No results for input(s): VITAMINB12, FOLATE, FERRITIN, TIBC, IRON, RETICCTPCT in the last 72 hours. Urinalysis    Component Value Date/Time   COLORURINE AMBER (A) 07/12/2016 1733   APPEARANCEUR CLOUDY (A) 07/12/2016 1733   LABSPEC 1.013 07/12/2016 1733   PHURINE 9.0 (H) 07/12/2016 1733   GLUCOSEU NEGATIVE 07/12/2016 1733   HGBUR MODERATE (A) 07/12/2016 1733   BILIRUBINUR NEGATIVE 07/12/2016 1733   KETONESUR NEGATIVE 07/12/2016 1733   PROTEINUR >=300 (A) 07/12/2016 1733   UROBILINOGEN 0.2 12/20/2014 1851   NITRITE NEGATIVE 07/12/2016 1733   LEUKOCYTESUR LARGE (A) 07/12/2016 1733   Sepsis Labs Invalid input(s): PROCALCITONIN,  WBC,  LACTICIDVEN Microbiology Recent Results (from the past 240 hour(s))  Culture, blood (routine x 2)     Status: None   Collection Time: 07/19/16  8:02 AM  Result Value Ref  Range Status   Specimen Description BLOOD RIGHT HAND  Final   Special Requests BOTTLES DRAWN AEROBIC ONLY 5CC  Final   Culture   Final    NO GROWTH 5 DAYS Performed at [facility], [place] [address] [place], [place] [address]    Report Status 07/24/2016 FINAL  Final  Culture, blood (routine x 2)     Status: None   Collection Time: 07/19/16  8:04 AM  Result Value Ref Range Status   Specimen Description BLOOD LEFT HAND  Final   Special Requests IN PEDIATRIC BOTTLE 3CC  Final   Culture   Final    NO GROWTH 5 DAYS Performed at [facility], [place] [address] [place], [place] [address]    Report Status 07/24/2016 FINAL  Final   Time coordinating discharge: Over 30 minutes  SIGNED:  Kerney Elbe, DO [facility] 07/28/2016, 12:06 PM Pager [phone]  If 7PM-7AM, please contact night-coverage [email or web address] Password [email or web address]

## 2016-07-28 NOTE — Progress Notes (Signed)
Patient ID: [name], female   DOB: June 18, 1960, 56 y.o.   MRN: [id number]    Referring Physician(s): Franchot Gallo  Supervising Physician: Arne Cleveland  Patient Status: [facility] - In-pt  Chief Complaint: Possible urinoma  Subjective: Patient with no complaints.  Eating lunch  Allergies: Patient has no known allergies.  Medications: Prior to Admission medications   Medication Sig Start Date End Date Taking? Authorizing Provider  acetaminophen (TYLENOL) 325 MG tablet Take 2 tablets (650 mg total) by mouth every 6 (six) hours as needed for fever. 03/20/16  Yes Bonnielee Haff, MD  albuterol (PROVENTIL) (2.5 MG/3ML) 0.083% nebulizer solution Take 3 mLs (2.5 mg total) by nebulization every 6 (six) hours as needed for wheezing or shortness of breath. 12/30/15  Yes Robbie Lis, MD  atorvastatin (LIPITOR) 10 MG tablet Take 10 mg by mouth at bedtime.    Yes Historical Provider, MD  famotidine (PEPCID) 20 MG tablet Take 1 tablet (20 mg total) by mouth 2 (two) times daily. 11/18/15  Yes Cherene Altes, MD  furosemide (LASIX) 40 MG tablet Take 40 mg by mouth 2 (two) times daily.   Yes Historical Provider, MD  gabapentin (NEURONTIN) 100 MG capsule Take 1 capsule (100 mg total) by mouth at bedtime. 11/18/15  Yes Cherene Altes, MD  glipiZIDE (GLUCOTROL) 5 MG tablet Take 5 mg by mouth daily before breakfast.   Yes Historical Provider, MD  lisinopril (PRINIVIL,ZESTRIL) 2.5 MG tablet Take 2.5 mg by mouth daily.  11/30/15  Yes Historical Provider, MD  metFORMIN (GLUCOPHAGE) 500 MG tablet Take 0.5 tablets (250 mg total) by mouth 2 (two) times daily. 04/22/16  Yes Dinah C Ngetich, NP  Multiple Vitamin (MULTIVITAMIN WITH MINERALS) TABS tablet Take 1 tablet by mouth daily. 07/09/15  Yes Florencia Reasons, MD  nystatin cream (MYCOSTATIN) Apply 1 application topically 2 (two) times daily.   Yes Historical Provider, MD  omega-3 acid ethyl esters (LOVAZA) 1 g capsule Take 1 g by mouth daily.   Yes Historical  Provider, MD  polyethylene glycol (MIRALAX / GLYCOLAX) packet Take 17 g by mouth 2 (two) times daily. 11/18/15  Yes Cherene Altes, MD  potassium chloride (K-DUR,KLOR-CON) 10 MEQ tablet Take 1 tablet (10 mEq total) by mouth daily. 03/21/16  Yes Bonnielee Haff, MD  feeding supplement (BOOST / RESOURCE BREEZE) LIQD Take 1 Container by mouth 2 (two) times daily between meals. 07/28/16   [place], DO  hydrocerin (EUCERIN) CREA Apply 1 application topically 2 (two) times daily. 07/28/16   [place], DO  HYDROcodone-homatropine (HYCODAN) 5-1.5 MG/5ML syrup Take 5 mLs by mouth every 6 (six) hours as needed for cough. 07/28/16   Kerney Elbe, DO    Vital Signs: BP (!) 144/75 (BP Location: Left Arm)   Pulse 85   Temp 99.2 F (37.3 C) (Oral)   Resp 20   Ht 5\' 5"  (1.651 m)   Wt 144 lb 13.5 oz (65.7 kg)   SpO2 94%   BMI 24.10 kg/m   Physical Exam: Abd: SP tube in place.  Patient sitting in stool.  Dressing was still clean.  Urine is clear yellow.  Imaging: Ct Abdomen Pelvis W Contrast  Result Date: 07/26/2016 CLINICAL DATA:  Pelvic fluid collections, fever. EXAM: CT ABDOMEN AND PELVIS WITH CONTRAST TECHNIQUE: Multidetector CT imaging of the abdomen and pelvis was performed using the standard protocol following bolus administration of intravenous contrast. CONTRAST:  178mL ISOVUE-300 IOPAMIDOL (ISOVUE-300) INJECTION 61% COMPARISON:  CT scan of  July 15, 2016. FINDINGS: Lower chest: Minimal bilateral pleural effusions are noted with adjacent subsegmental atelectasis. Hepatobiliary: No gallstones are noted. Small hepatic cysts are noted. Pancreas: Unremarkable. No pancreatic ductal dilatation or surrounding inflammatory changes. Spleen: Normal in size without focal abnormality. Adrenals/Urinary Tract: Adrenal glands appear normal. Bilateral nephrostomy catheters are noted in grossly good position. 18 mm nonobstructive calculus is noted in upper pole collecting system of left  kidney. 17 mm calculus is noted in left renal pelvis. Moderate left hydronephrosis is noted secondary to 16 x 7 mm calculus in distal left ureter. Urinary bladder is decompressed secondary to Foley catheter. Stomach/Bowel: There is no evidence of bowel obstruction. The appendix appears normal. Vascular/Lymphatic: Left retroperitoneal lymph nodes are again noted which are improved compared to prior exam, with the largest measuring 9 mm. Reproductive: Stable large fibroid uterus is noted. Ovaries are unremarkable. Other: The fluid collections seen anteriorly in the pelvis on the prior exam appear to have nearly completely resolved. Small residual fluid collection measuring 4 mm is noted anteriorly in the left. Musculoskeletal: No acute or significant osseous findings. IMPRESSION: Bilateral nephrostomy tubes are in grossly good position. Urinary bladder is decompressed secondary to Foley catheter. Large nonobstructive calculi are noted in the upper pole collecting system of left kidney and left renal pelvis. Moderate left hydronephrosis is noted secondary to 16 x 7 mm calculus in distal left ureter. Stable large fibroid uterus. Left retroperitoneal adenopathy is again noted, but improved compared to prior exam. Fluid collections seen anteriorly in the pelvis on the prior exam appear to have nearly completely resolved. Electronically Signed   By: Marijo Conception, M.D.   On: 07/26/2016 11:22   Ct Image Guided Drainage By Percutaneous Catheter  Result Date: 07/27/2016 INDICATION: 56 year old with multiple sclerosis and bilateral nephrostomy tubes. Concern for a bladder perforation. Cystoscopy demonstrated bladder dome inflammation and urology requested placement of a suprapubic tube. EXAM: CT-GUIDED PLACEMENT OF SUPRAPUBIC BLADDER CATHETER COMPARISON:  None. MEDICATIONS: Patient was already on scheduled antibiotics. ANESTHESIA/SEDATION: Fentanyl 200 mcg IV; Versed 4.0 mg IV Moderate Sedation Time:  42 minutes The  patient was continuously monitored during the procedure by the interventional radiology nurse under my direct supervision. CONTRAST:  None FLUOROSCOPY TIME:  None COMPLICATIONS: None immediate. PROCEDURE: Informed written consent was obtained from the patient after a thorough discussion of the procedural risks, benefits and alternatives. All questions were addressed. A timeout was performed prior to the initiation of the procedure. CT images through the pelvis were obtained. The urinary bladder was initially decompressed with a Foley catheter. As a result, 250 mL of sterile saline was instilled in the bladder through the Foley catheter. Once the bladder was adequately distended, the anterior pelvis was prepped and draped in a sterile fashion. Skin was anesthetized with 1% lidocaine. 18 gauge trocar needle was directed into the bladder with CT guidance and a stiff Amplatz wire was placed. The tract was dilated to accommodate a 12 [place] multipurpose drain. The drain was easily advanced over the wire and reconstituted in the bladder. Catheter was sutured to the skin with suture and attached to a gravity bag. The Foley catheter balloon was deflated and the Foley catheter was removed without complication. FINDINGS: Initial images demonstrated a decompressed urinary bladder with a Foley catheter. Bladder was adequate distended with normal saline. Evidence for multiple gas-filled bladder diverticula but no definite bladder leak. 12 French drain was successfully advanced into the bladder. IMPRESSION: Successful placement of a CT-guided suprapubic bladder catheter. Electronically Signed  By: Markus Daft M.D.   On: 07/27/2016 12:06    Labs:  CBC:  Recent Labs  07/23/16 0905 07/26/16 0519 07/27/16 0509 07/28/16 0435  WBC 14.0* 10.8* 8.9 9.4  HGB 7.9* 7.6* 7.6* 7.8*  HCT 24.6* 23.9* 23.5* 24.3*  PLT 425* 390 389 377    COAGS:  Recent Labs  03/08/16 2119 03/09/16 0720 03/09/16 1401 03/10/16 0740   05/13/16 1036 07/12/16 1239 07/15/16 0845 07/26/16 0519  INR 1.68  --   --   --   < > 1.18 5.27* 1.16 1.15  APTT 45* 47* 51* 39*  --   --   --   --   --   < > = values in this interval not displayed.  BMP:  Recent Labs  07/22/16 0353 07/26/16 0519 07/27/16 0509 07/28/16 0435  NA 140 143 142 145  K 3.6 3.2* 3.2* 3.3*  CL 110 113* 113* 117*  CO2 26 26 25 23   GLUCOSE 148* 106* 105* 106*  BUN 7 8 7  5*  CALCIUM 8.8* 9.4 9.4 9.5  CREATININE 0.72 0.65 0.71 0.60  GFRNONAA >60 >60 >60 >60  GFRAA >60 >60 >60 >60    LIVER FUNCTION TESTS:  Recent Labs  05/05/16 0032 05/23/16 07/12/16 1239 07/16/16 0749 07/28/16 0435  BILITOT 1.3*  --  1.3* 0.6 0.9  AST 37 10* 32 30 11*  ALT 17 13 11* 47 14  ALKPHOS 37* 57 41 136* 58  PROT 5.3*  --  5.2* 6.0* 7.1  ALBUMIN 2.2*  --  2.0* 1.9* 2.4*    Assessment and Plan: 1. S/p SP tube placement on 2/14 -pt doing well.  No acute issues today with SP tube -cont drain placement per urology as they state she will need this for the foreseeable future.  Electronically Signed: Henreitta Cea 07/28/2016, 2:52 PM   I spent a total of 15 Minutes at the the patient's bedside AND on the patient's hospital floor or unit, greater than 50% of which was counseling/coordinating care for pelvic infection, possible urinoma

## 2016-07-28 NOTE — Clinical Social Work Placement (Signed)
   CLINICAL SOCIAL WORK PLACEMENT  NOTE  Date:  07/28/2016  Patient Details  Name: [name] MRN: [id number] Date of Birth: 07/25/1960  Clinical Social Work is seeking post-discharge placement for this patient at the [place] level of care (*CSW will initial, date and re-position this form in  chart as items are completed):  Yes   Patient/family provided with [place] Work Department's list of facilities offering this level of care within the geographic area requested by the patient (or if unable, by the patient's family).  Yes   Patient/family informed of their freedom to choose among providers that offer the needed level of care, that participate in Medicare, Medicaid or managed care program needed by the patient, have an available bed and are willing to accept the patient.  Yes   Patient/family informed of [facility]'s ownership interest in [facility] and [facility], as well as of the fact that they are under no obligation to receive care at these facilities.  PASRR submitted to EDS on       PASRR number received on       Existing PASRR number confirmed on 07/28/16     FL2 transmitted to all facilities in geographic area requested by pt/family on       FL2 transmitted to all facilities within larger geographic area on       Patient informed that his/her managed care company has contracts with or will negotiate with certain facilities, including the following:  [facility]     Yes   Patient/family informed of bed offers received.  Patient chooses bed at [facility]     Physician recommends and patient chooses bed at [facility]    Patient to be transferred to [facility] on 07/28/16.  Patient to be transferred to facility by [facility]     Patient family notified on   of transfer.  Name of family member notified:  Patient notiifed Family.      PHYSICIAN       Additional Comment:     _______________________________________________ Lia Hopping, LCSW 07/28/2016, 12:27 PM

## 2016-07-29 ENCOUNTER — Encounter (HOSPITAL_COMMUNITY): Payer: Self-pay | Admitting: Gastroenterology

## 2016-07-29 LAB — URINE CULTURE

## 2016-07-29 NOTE — Progress Notes (Signed)
Called Selita at [facility] and left a message on voice mail  to inform her that she needs to send an order to the Nursing Home for Chlorhexidine scrub so patient can use it the night before surgery and morning of surgery.

## 2016-07-30 LAB — URINE CULTURE: Culture: 20000 — AB

## 2016-08-01 NOTE — Progress Notes (Addendum)
Preop instructions for: [name]                        Date of Birth    -[birth year]                        Date of Procedure: Thursday 08/04/2016      Doctor:Dr. Franchot Gallo  Time to arrive at [facility] am Report to: Admitting  Procedure: Left Percutaneous Nephrolithotomy  Any procedure time changes, MD office will notify you!  Please follow the bathing instructions for [facility]- Preparing for Surgery sheet!   Do not eat or drink past midnight the night before your procedure.(To include any tube feedings-must be discontinued)     Take these morning medications only with sips of water.(or give through gastrostomy or feeding tube).  Pepcid and use Albuterol inhaler if needed   Note: No Insulin or Diabetic meds should be given or taken the morning of the procedure!   Facility contact:  At [facility]                Phone:  [phone] ext.[place] POA: [name] (cousin)  [phone] or cell- [phone] or [place]  [phone]    [phone]  Transportation contact phone#: [name]  Please send day of procedure:current med list and meds last taken that day, confirm nothing by mouth status from what time, Patient Demographic info( to include DNR status, problem list, allergies)   RN contact name/phone#:  Rosemary at [employer]                         and Fax [phone]  [place] card and picture ID Leave all jewelry and other valuables at place where living( no metal or rings to be worn) No contact lens Women-no make-up, no lotions,perfumes,powders Men-no colognes,lotions  Any questions day of procedure,call Short Stay unit-[phone]!   Sent from :[facility]                   [place]                  Fax:[phone]  Sent by : Charmaine Downs. Tobin Chad

## 2016-08-02 ENCOUNTER — Other Ambulatory Visit (HOSPITAL_COMMUNITY): Payer: Medicare Other

## 2016-08-02 NOTE — Progress Notes (Signed)
Faxed lab results to Dr. Diona Fanti via [facility] and also talked to Mickel Baas in Triage at [facility] who said she will let Dr. Diona Fanti know about reviewing the labs.

## 2016-08-04 ENCOUNTER — Non-Acute Institutional Stay (SKILLED_NURSING_FACILITY): Payer: Medicare Other | Admitting: Internal Medicine

## 2016-08-04 ENCOUNTER — Encounter: Payer: Self-pay | Admitting: Internal Medicine

## 2016-08-04 DIAGNOSIS — A419 Sepsis, unspecified organism: Secondary | ICD-10-CM

## 2016-08-04 DIAGNOSIS — D649 Anemia, unspecified: Secondary | ICD-10-CM | POA: Diagnosis not present

## 2016-08-04 DIAGNOSIS — G35 Multiple sclerosis: Secondary | ICD-10-CM

## 2016-08-04 DIAGNOSIS — I82402 Acute embolism and thrombosis of unspecified deep veins of left lower extremity: Secondary | ICD-10-CM | POA: Diagnosis not present

## 2016-08-04 DIAGNOSIS — I10 Essential (primary) hypertension: Secondary | ICD-10-CM | POA: Diagnosis not present

## 2016-08-04 DIAGNOSIS — E118 Type 2 diabetes mellitus with unspecified complications: Secondary | ICD-10-CM

## 2016-08-04 NOTE — Progress Notes (Signed)
Location:  [facility] Room Number: [id number] Place of Service:  SNF [id number]) Provider:  Ann Held, MD  Patient Care Team: Blanchie Serve, MD as PCP - General (Internal Medicine) Franchot Gallo, MD as Consulting Physician (Urology)  Extended Emergency Contact Information Primary Emergency Contact: Hughes,Michael Address: [address]          [place], [place] [address] Johnnette Litter of [place] Phone: [phone] Mobile Phone: [phone] Relation: Son Secondary Emergency Contact: [facility] Address: [address] [place], [place] [address] Johnnette Litter of [place] Phone: [phone] Mobile Phone: [phone] Relation: Relative  Code Status: Full Code Goals of care: Advanced Directive information Advanced Directives 07/12/2016  Does Patient Have a Medical Advance Directive? Yes  Type of Advance Directive [place]  Does patient want to make changes to medical advance directive? No - Patient declined  Copy of [place] in Chart? (No Data)  Would patient like information on creating a medical advance directive? -  Pre-existing out of facility DNR order (yellow form or pink MOST form) -     Chief Complaint  Patient presents with  . Readmit To SNF    Readmission Visit     HPI:  Pt is a 56 y.o. female seen today for medical management of chronic diseases.   Patient is long term resident of Facility and has h/o Multiple sclerosis, H/o DVT on Xarelto, B/L Obstructing Nephrolithiasis S/P Percutaneous tube who was admitted to the hospital with fever , Hypotension and Hgb of 4.  She was found to have Urosepsis and was treated with Antibiotics. CT scan of her abdomen showed some fluid collection which completely resolved with Foley cathter. She Spiked temp agin in the hospital and  Her Chest xray also showed LLL pneumonia which she was treated for. Her Hgb was low  and she was transfused. She also had Colonoscopy and EGD done on 07/25/16 showed no reason for bleeding. She also had negative w/u for Hemolytic anemia. Her Hgb haas been stable since transfusion. Her Xarelto was held. She did have worsening in her Renal function but it resolved with Hydration. She also had SPC done by radiology and her foley cathter was removed  Patient is back to her Baseline in her facility. She is able to drive around in her Motorized wheelchair. She denies any more diarrhea. No cough or fever. No SOB. She says she does not want any more surgery and is going to tell that to Urologist.    Past Medical History:  Diagnosis Date  . Acute renal failure superimposed on stage 3 chronic kidney disease ([place]) 07/21/2015  . Anemia    chronic   . Diabetes mellitus without complication ([place])   . Diabetic neuropathy ([place])   . DVT (deep venous thrombosis) ([place])   . DVT (deep venous thrombosis) ([place])   . GERD (gastroesophageal reflux disease)   . Hx of sepsis   . Hypertension   . Left nephrolithiasis   . Leukocytosis   . Lymph edema    chronic   . Malnutrition of moderate degree 07/21/2015  . Multiple sclerosis diagnosed 2002-not on Therapy any longer 06/26/2012  . Muscle weakness (generalized)   . Neuromuscular disorder ([place])   . Neuromuscular dysfunction of bladder   . Polyneuropathy ([place])   . Presence of indwelling urinary catheter   . Protein calorie malnutrition ([place])   . Stage 4  decubitus ulcer ([place]) 07/05/2015  . Stroke ([place])   . UTI (lower urinary tract infection)    Past Surgical History:  Procedure Laterality Date  . COLONOSCOPY WITH PROPOFOL N/A 07/25/2016   Procedure: COLONOSCOPY WITH PROPOFOL;  Surgeon: Laurence Spates, MD;  Location: [facility];  Service: Endoscopy;  Laterality: N/A;  . ESOPHAGOGASTRODUODENOSCOPY Left 01/02/2015   Procedure: ESOPHAGOGASTRODUODENOSCOPY (EGD);  Surgeon: Arta Silence, MD;  Location: [facility];  Service: Endoscopy;  Laterality:  Left;  . ESOPHAGOGASTRODUODENOSCOPY (EGD) WITH PROPOFOL N/A 07/25/2016   Procedure: ESOPHAGOGASTRODUODENOSCOPY (EGD) WITH PROPOFOL;  Surgeon: Laurence Spates, MD;  Location: [facility];  Service: Endoscopy;  Laterality: N/A;  . HERNIA MESH REMOVAL    . IR GENERIC HISTORICAL  02/23/2016   IR NEPHROSTOMY EXCHANGE LEFT 02/23/2016 Sandi Mariscal, MD [facility]  . IR GENERIC HISTORICAL  04/19/2016   IR NEPHROSTOMY EXCHANGE LEFT 04/19/2016 Arne Cleveland, MD [facility]  . IR GENERIC HISTORICAL  05/13/2016   IR NEPHROSTOMY EXCHANGE LEFT 05/13/2016 Corrie Mckusick, DO [facility]  . IR GENERIC HISTORICAL  05/13/2016   IR NEPHROSTOMY PLACEMENT RIGHT 05/13/2016 Corrie Mckusick, DO [facility]  . IR GENERIC HISTORICAL  07/07/2016   IR NEPHROSTOMY EXCHANGE RIGHT 07/07/2016 Jacqulynn Cadet, MD [facility]  . IR GENERIC HISTORICAL  07/07/2016   IR NEPHROSTOMY EXCHANGE LEFT 07/07/2016 Jacqulynn Cadet, MD [facility]  . NEPHROSTOMY TUBE PLACEMENT ([place] HX)    . TRANSURETHRAL RESECTION OF BLADDER TUMOR N/A 03/16/2016   Procedure: CYSTOSCOPY BLADDER BIOPSY;  Surgeon: Franchot Gallo, MD;  Location: [facility];  Service: Urology;  Laterality: N/A;  . TUBAL LIGATION    . tubes tided      No Known Allergies  Allergies as of 08/04/2016   No Known Allergies     Medication List       Accurate as of 08/04/16 10:27 AM. Always use your most recent med list.          acetaminophen 325 MG tablet Commonly known as:  TYLENOL Take 2 tablets (650 mg total) by mouth every 6 (six) hours as needed for fever.   albuterol (2.5 MG/3ML) 0.083% nebulizer solution Commonly known as:  PROVENTIL Take 3 mLs (2.5 mg total) by nebulization every 6 (six) hours as needed for wheezing or shortness of breath.   atorvastatin 10 MG tablet Commonly known as:  LIPITOR Take 10 mg by mouth at bedtime.   famotidine 20 MG tablet Commonly known as:  PEPCID Take 1 tablet (20 mg total) by mouth 2 (two) times daily.   gabapentin 100 MG  capsule Commonly known as:  NEURONTIN Take 1 capsule (100 mg total) by mouth at bedtime.   glipiZIDE 5 MG tablet Commonly known as:  GLUCOTROL Take 5 mg by mouth daily before breakfast.   HYDROcodone-homatropine 5-1.5 MG/5ML syrup Commonly known as:  HYCODAN Take 5 mLs by mouth every 6 (six) hours as needed for cough.   lisinopril 2.5 MG tablet Commonly known as:  PRINIVIL,ZESTRIL Take 2.5 mg by mouth daily.   metFORMIN 500 MG tablet Commonly known as:  GLUCOPHAGE Take 0.5 tablets (250 mg total) by mouth 2 (two) times daily.   multivitamin with minerals Tabs tablet Take 1 tablet by mouth daily.   nystatin cream Commonly known as:  MYCOSTATIN Apply 1 application topically 2 (two) times daily. Apply to groin and buttock twice daily for fungal rash   omega-3 acid ethyl esters 1 g capsule Commonly known as:  LOVAZA Take 1 g by mouth daily.   polyethylene glycol  packet Commonly known as:  MIRALAX / GLYCOLAX Take 17 g by mouth 2 (two) times daily.   potassium chloride 10 MEQ tablet Commonly known as:  K-DUR,KLOR-CON Take 1 tablet (10 mEq total) by mouth daily.   sodium chloride irrigation 0.9 % irrigation Irrigate with 20 mLs as directed 2 (two) times daily. BLADDER IRRIGATION       Review of Systems  Review of Systems  Constitutional: Negative for activity change, appetite change, chills, diaphoresis, fatigue and fever.  HENT: Negative for mouth sores, postnasal drip, rhinorrhea, sinus pain and sore throat.   Respiratory: Negative for apnea, cough, chest tightness, shortness of breath and wheezing.   Cardiovascular: Negative for chest pain, palpitations and leg swelling.  Gastrointestinal: Negative for abdominal distention, abdominal pain, constipation, diarrhea, nausea and vomiting.  Genitourinary: Negative for dysuria and frequency.  Musculoskeletal: Negative for arthralgias, joint swelling and myalgias.  Skin: Negative for rash.  Neurological: Negative for  dizziness, syncope, weakness, light-headedness and numbness.  Psychiatric/Behavioral: Negative for behavioral problems, confusion and sleep disturbance.     Immunization History  Administered Date(s) Administered  . Influenza Split 06/26/2012  . Influenza-Unspecified 04/02/2015  . PPD Test 01/04/2015, 07/09/2015, 07/23/2015, 09/02/2015, 12/31/2015, 07/28/2016  . Pneumococcal-Unspecified 03/13/2014   Pertinent  Health Maintenance Due  Topic Date Due  . FOOT EXAM  10/19/1968  . OPHTHALMOLOGY EXAM  10/19/1968  . PAP SMEAR  10/20/1979  . MAMMOGRAM  09/19/2015  . INFLUENZA VACCINE  01/12/2016  . HEMOGLOBIN A1C  01/12/2017  . COLONOSCOPY  07/25/2026   No flowsheet data found. Functional Status Survey:    Vitals:   08/04/16 1011  BP: 122/76  Pulse: 81  Resp: 19  Temp: 98.7 F (37.1 C)  TempSrc: Oral  SpO2: 95%  Weight: 137 lb (62.1 kg)  Height: 5\' 6"  (1.676 m)   Body mass index is 22.11 kg/m. Physical Exam  Constitutional: She is oriented to person, place, and time. She appears well-developed.  HENT:  Head: Normocephalic.  Mouth/Throat: Oropharynx is clear and moist.  Eyes: Pupils are equal, round, and reactive to light.  Neck: Neck supple.  Cardiovascular: Normal rate, regular rhythm and normal heart sounds.   Pulmonary/Chest: Effort normal and breath sounds normal. No respiratory distress. She has no wheezes. She has no rales.  Abdominal: Soft. Bowel sounds are normal. She exhibits no distension. There is no tenderness. There is no rebound.  Has Nephrostomy tubes draining Urine well.  Musculoskeletal: She exhibits edema.  Neurological: She is alert and oriented to person, place, and time.  Skin: Skin is warm and dry. No rash noted. No erythema.  Psychiatric: She has a normal mood and affect. Her behavior is normal. Thought content normal.    Labs reviewed:  Recent Labs  05/08/16 1800  07/15/16 0500  07/26/16 0519 07/27/16 0509 07/28/16 0435  NA  --   < >  140  < > 143 142 145  K  --   < > 3.9  < > 3.2* 3.2* 3.3*  CL  --   < > 113*  < > 113* 113* 117*  CO2  --   < > 21*  < > 26 25 23   GLUCOSE  --   < > 92  < > 106* 105* 106*  BUN  --   < > 14  < > 8 7 5*  CREATININE  --   < > 0.88  < > 0.65 0.71 0.60  CALCIUM  --   < > 9.3  < >  9.4 9.4 9.5  MG 2.0  --  1.7  --   --   --  1.4*  PHOS 2.2*  --  2.6  --   --   --  2.5  < > = values in this interval not displayed.  Recent Labs  07/12/16 1239 07/16/16 0749 07/28/16 0435  AST 32 30 11*  ALT 11* 47 14  ALKPHOS 41 136* 58  BILITOT 1.3* 0.6 0.9  PROT 5.2* 6.0* 7.1  ALBUMIN 2.0* 1.9* 2.4*    Recent Labs  07/19/16 0426 07/20/16 0414  07/26/16 0519 07/27/16 0509 07/28/16 0435  WBC 12.5* 10.6*  < > 10.8* 8.9 9.4  NEUTROABS 9.0* 6.9  --   --   --  5.2  HGB 8.2* 7.9*  < > 7.6* 7.6* 7.8*  HCT 24.8* 25.2*  < > 23.9* 23.5* 24.3*  MCV 88.3 87.8  < > 87.9 87.7 85.6  PLT 443* 447*  < > 390 389 377  < > = values in this interval not displayed. Lab Results  Component Value Date   TSH 1.787 07/21/2015   Lab Results  Component Value Date   HGBA1C 5.3 07/15/2016   Lab Results  Component Value Date   CHOL 169 02/18/2016   HDL 52 02/18/2016   LDLCALC 95 02/18/2016   TRIG 109 02/18/2016    Significant Diagnostic Results in last 30 days:  Ct Abdomen Pelvis W Contrast  Result Date: 07/26/2016 CLINICAL DATA:  Pelvic fluid collections, fever. EXAM: CT ABDOMEN AND PELVIS WITH CONTRAST TECHNIQUE: Multidetector CT imaging of the abdomen and pelvis was performed using the standard protocol following bolus administration of intravenous contrast. CONTRAST:  119mL ISOVUE-300 IOPAMIDOL (ISOVUE-300) INJECTION 61% COMPARISON:  CT scan of July 15, 2016. FINDINGS: Lower chest: Minimal bilateral pleural effusions are noted with adjacent subsegmental atelectasis. Hepatobiliary: No gallstones are noted. Small hepatic cysts are noted. Pancreas: Unremarkable. No pancreatic ductal dilatation or surrounding  inflammatory changes. Spleen: Normal in size without focal abnormality. Adrenals/Urinary Tract: Adrenal glands appear normal. Bilateral nephrostomy catheters are noted in grossly good position. 18 mm nonobstructive calculus is noted in upper pole collecting system of left kidney. 17 mm calculus is noted in left renal pelvis. Moderate left hydronephrosis is noted secondary to 16 x 7 mm calculus in distal left ureter. Urinary bladder is decompressed secondary to Foley catheter. Stomach/Bowel: There is no evidence of bowel obstruction. The appendix appears normal. Vascular/Lymphatic: Left retroperitoneal lymph nodes are again noted which are improved compared to prior exam, with the largest measuring 9 mm. Reproductive: Stable large fibroid uterus is noted. Ovaries are unremarkable. Other: The fluid collections seen anteriorly in the pelvis on the prior exam appear to have nearly completely resolved. Small residual fluid collection measuring 4 mm is noted anteriorly in the left. Musculoskeletal: No acute or significant osseous findings. IMPRESSION: Bilateral nephrostomy tubes are in grossly good position. Urinary bladder is decompressed secondary to Foley catheter. Large nonobstructive calculi are noted in the upper pole collecting system of left kidney and left renal pelvis. Moderate left hydronephrosis is noted secondary to 16 x 7 mm calculus in distal left ureter. Stable large fibroid uterus. Left retroperitoneal adenopathy is again noted, but improved compared to prior exam. Fluid collections seen anteriorly in the pelvis on the prior exam appear to have nearly completely resolved. Electronically Signed   By: Marijo Conception, M.D.   On: 07/26/2016 11:22   Ct Abdomen Pelvis W Contrast  Result Date: 07/15/2016 CLINICAL DATA:  Acute  onset of fever, hypotension, lactic acidosis and decreased hemoglobin. Initial encounter. EXAM: CT ABDOMEN AND PELVIS WITH CONTRAST TECHNIQUE: Multidetector CT imaging of the abdomen  and pelvis was performed using the standard protocol following bolus administration of intravenous contrast. CONTRAST:  18mL ISOVUE-300 IOPAMIDOL (ISOVUE-300) INJECTION 61% COMPARISON:  CT of the abdomen and pelvis from 07/12/2016 FINDINGS: Lower chest: Trace bilateral pleural effusions are noted, with associated atelectasis. Trace pericardial fluid remains within normal limits. Hepatobiliary: A 5 mm hypodensity is noted in the periphery of the right hepatic lobe. The liver is otherwise unremarkable. The gallbladder is relatively decompressed and grossly unremarkable in appearance. The common bile duct remains normal in caliber. Pancreas: The pancreas is within normal limits. Spleen: The spleen is unremarkable in appearance. Adrenals/Urinary Tract: There is moderate chronic right-sided hydronephrosis despite right-sided nephrostomy tube placement, similar in appearance to the prior study. The nephrostomy tubes are stable in appearance. Chronic inflammation is noted about the left renal pelvis. Multiple left renal cysts are seen. Nonspecific perinephric stranding and fluid are noted bilaterally. Evaluation for renal stones is limited given contrast in the left renal calyces. No obstructing ureteral stones are identified. Stomach/Bowel: The appendix is grossly unremarkable in appearance. The degree of wall thickening and soft tissue inflammation about the proximal sigmoid colon is mildly improved from the prior study. No definite mass was seen in this region on earlier studies in 2017. This is concerning for acute diverticulitis, with diffuse surrounding soft tissue inflammation. New collections of fluid are seen tracking anterior to the bladder, with minimal associated foci of fat attenuation and diffuse surrounding soft tissue inflammation. Collections of fluid measure approximately 8.4 x 2.9 x 4.9 cm, and raise concern for evolving abscess. A colonic source is considered more likely. Minimal soft tissue  inflammation is noted at the mid transverse colon, reflecting the infectious process within the pelvis. The small bowel is grossly unremarkable in appearance. The stomach is unremarkable. Vascular/Lymphatic: Minimal calcification is seen along the distal abdominal aorta and its branches. Mild mural thrombus is seen along the distal abdominal aorta. Prominent retroperitoneal nodes measure up to 1.6 cm in short axis. Prominent pelvic sidewall nodes measure up to 1.1 cm in short axis. This may reflect acute infection or possibly underlying metastatic disease. Reproductive: Marked soft tissue inflammation is noted about the anterior bladder, with multiple underlying bladder wall diverticula. A Foley catheter is noted. Multiple uterine fibroids are seen. No suspicious adnexal masses are identified, though the ovaries are difficult to fully assess given the infectious process within the pelvis. Other: No additional soft tissue abnormalities are seen. Musculoskeletal: No acute osseous abnormalities are identified. The visualized musculature is unremarkable in appearance. IMPRESSION: 1. New collections of fluid tracking anterior to the bladder, measuring approximately 8.4 x 2.9 x 4.9 cm, with diffuse surrounding soft tissue inflammation and minimal associated foci of fat attenuation. This raises concern for evolving abscess within the anterior pelvis, in similar distribution to prior regions of phlegmon noted earlier in 2017. A colonic source is considered more likely. 2. Slight interval improvement in the appearance of wall thickening and soft tissue inflammation about the proximal sigmoid colon, still concerning for acute diverticulitis. Diffuse surrounding soft tissue inflammation again noted. 3. Moderate chronic right-sided hydronephrosis again noted, despite right-sided nephrostomy tube placement. The nephrostomy tubes are stable in appearance. Chronic inflammation about the left renal pelvis. 4. Prominent pelvic  sidewall and retroperitoneal nodes, measuring up to 1.6 cm in short axis. This may reflect acute infection or possibly underlying metastatic  disease. Further evaluation may be considered as deemed clinically appropriate. 5. Nonspecific 5 mm hypodensity in the periphery of the right hepatic lobe. 6. Trace bilateral pleural effusions, with associated atelectasis. 7. Mild mural thrombus along the distal abdominal aorta, without evidence of luminal narrowing. 8. Multiple uterine fibroids again noted. Electronically Signed   By: Garald Balding M.D.   On: 07/15/2016 18:11   Dg Chest Port 1 View  Result Date: 07/18/2016 CLINICAL DATA:  Shortness of breath.  Wheezing.  Fever. EXAM: PORTABLE CHEST 1 VIEW COMPARISON:  July 14, 2016 FINDINGS: There is patchy airspace consolidation in the left mid lower lung zones with small left pleural effusion. Right lung is clear. Heart is upper normal in size with pulmonary vascularity within normal limits. No adenopathy. Central catheter tip is in the superior vena cava. No pneumothorax. There is a pigtail type catheter in the left upper quadrant region, incompletely visualized. IMPRESSION: Patchy infiltrate left mid lower lung zones with small left pleural effusion. Lungs elsewhere clear. Stable cardiac silhouette. Electronically Signed   By: Lowella Grip III M.D.   On: 07/18/2016 17:45   Dg Chest Port 1 View  Result Date: 07/14/2016 CLINICAL DATA:  Respiratory failure. EXAM: PORTABLE CHEST 1 VIEW COMPARISON:  07/12/2016 . FINDINGS: Right IJ In stable position. Heart size stable. Bibasilar subsegmental atelectasis. No pleural effusion or pneumothorax. IMPRESSION: 1. Right IJ line stable position. 2. Mild bibasilar subsegmental atelectasis. Electronically Signed   By: Marcello Moores  Register   On: 07/14/2016 07:15   Dg Chest Portable 1 View  Result Date: 07/12/2016 CLINICAL DATA:  Central line placement.  Initial encounter. EXAM: PORTABLE CHEST 1 VIEW COMPARISON:  Chest  radiograph performed earlier today at 1:05 p.m. FINDINGS: The patient's right IJ line is noted ending about the mid to distal SVC. The lungs are mildly hypoexpanded. Mild vascular crowding and vascular congestion are seen. No pleural effusion or pneumothorax is identified. The cardiomediastinal silhouette remains normal in size. No acute osseous abnormalities are identified. IMPRESSION: 1. Right IJ line noted ending about the mid to distal SVC. 2. Lungs mildly hypoexpanded but grossly clear. Mild vascular congestion noted. Electronically Signed   By: Garald Balding M.D.   On: 07/12/2016 17:45   Dg Chest Port 1 View  Result Date: 07/12/2016 CLINICAL DATA:  Shortness of breath today.  Hypertension. EXAM: PORTABLE CHEST 1 VIEW COMPARISON:  05/07/2016 FINDINGS: Normal heart, mediastinum and hila. Lungs are clear. No pleural effusion or pneumothorax. Skeletal structures are unremarkable. IMPRESSION: No active disease. Electronically Signed   By: Lajean Manes M.D.   On: 07/12/2016 13:25   Ct Renal Stone Study  Result Date: 07/12/2016 CLINICAL DATA:  Sepsis, nephrostomy tubes, follow, Ensure per pre placement, history diabetes mellitus, multiple sclerosis, acute on chronic renal failure, stroke, former smoker EXAM: CT ABDOMEN AND PELVIS WITHOUT CONTRAST TECHNIQUE: Multidetector CT imaging of the abdomen and pelvis was performed following the standard protocol without IV contrast. Sagittal and coronal MPR images reconstructed from axial data set. GI contrast was not administered. Beam hardening artifacts in the upper abdomen from inclusion of the patient's arms in the imaged field COMPARISON:  None. FINDINGS: Lower chest: Minimal dependent bibasilar atelectasis. Hepatobiliary: Gallbladder and liver grossly unremarkable Pancreas: Normal appearance Spleen: Normal appearance Adrenals/Urinary Tract: Thickened adrenal glands without mass. BILATERALnephrostomy tubes present at the renal pelves. Large cyst lateral LEFT  kidney 3.3 x 2.6 cm image 38. Mild LEFT renal collecting system dilatation. Moderate RIGHT renal collecting system dilatation. Nonobstructing LEFT renal calculi, 17  mm upper pole coronal image 80 and 18 mm renal pelvis coronal image 77. Dilatation of RIGHT ureter to the bladder. LEFT ureter decompressed with unchanged visualization of a 10 mm LEFT ureteral calculus image 77. No definite RIGHT ureteral calculus. Diffusely thickened and irregular bladder wall with small RIGHT lateral bladder diverticulum. Stomach/Bowel: Normal appendix. Thickened sigmoid colon with infiltrative changes of the sigmoid mesocolon, favor inflammatory process such as sigmoid colitis or diverticulitis. 13 mm diameter fluid levelcollection anterior to the urinary bladder, uncertain if represents a tiny bladder diverticulum containing air or a tiny extraluminal collection arising from a sigmoid colonic inflammatory process. Stomach and remaining bowel loops unremarkable. Vascular/Lymphatic: Atherosclerotic calcification without aneurysm. Upper normal size retroperitoneal nodes without abdominal or pelvic adenopathy. Reproductive: Unremarkable uterus and ovaries. Other: No free air or ascites.  No hernia. Musculoskeletal: Osseous demineralization diffusely. IMPRESSION: Expected position of the pigtails of the BILATERAL nephrostomy tubes within the renal collecting systems, with RIGHT greater than LEFT collecting system dilatation of the kidneys. Nonobstructing LEFT renal calculi and 10 mm distal LEFT ureteral calculus. Thick irregular bladder wall with small bladder diverticulum, question of cystitis raised. Thickened segment of sigmoid colon with pericolic inflammatory changes question due to diverticulitis or colitis. 13 x 16 mm diameter extraluminal fat/fluid level anterior to the urinary bladder, nonspecific. Electronically Signed   By: Lavonia Dana M.D.   On: 07/12/2016 15:44   Ir Nephrostomy Exchange Left  Result Date:  07/07/2016 INDICATION: 56 year old female with multiple sclerosis, left-sided nephrolithiasis and history of prior left-sided ureteral obstruction secondary to a distal ureteral stone. Additionally, there was some concern for bladder perforation or urine leak and a right-sided nephrostomy tube was placed for maximal urinary diversion on 05/13/2016. She presents today for exchange of both nephrostomy tubes. EXAM: IR EXCHANGE NEPHROSTOMY LEFT; IR EXCHANGE NEPHROSTOMY RIGHT COMPARISON:  Most recent prior CT imaging 06/09/2016 MEDICATIONS: None. ANESTHESIA/SEDATION: None. CONTRAST:  Approximately 20 mL - administered into the collecting system(s) FLUOROSCOPY TIME:  Fluoroscopy Time: 2 minutes 6 seconds (11.9 mGy). COMPLICATIONS: None immediate. PROCEDURE: Informed written consent was obtained from the patient after a thorough discussion of the procedural risks, benefits and alternatives. All questions were addressed. Maximal Sterile Barrier Technique was utilized including caps, mask, sterile gowns, sterile gloves, sterile drape, hand hygiene and skin antiseptic. A timeout was performed prior to the initiation of the procedure. Attention was first turned to the existing left-sided percutaneous nephrostomy tube. A hand injection of contrast material was performed. The tube is nearly occluded. With some effort, contrast was injected into the renal collecting system. The drainage catheter is located in the upper pole infundibulum in the region of a large renal stone. There is no evidence of hydronephrosis. Contrast material drains through the ureter, around a nonobstructing distal ureteral stone and into the bladder. The tube was cut and removed over a stiff Glidewire. A new Cook 10.2 [place] multipurpose drainage catheter was advanced over the wire and positioned with the locking loop in the renal pelvis. A gentle hand injection of contrast material and a fluoroscopic saved image confirms tube location. Attention was next  turned to the right nephrostomy tube. A gentle hand injection of contrast material opacifies the right renal collecting system. The right-sided tube is not clogged. Again, the tube lies in an upper pole infundibulum. The ureter is widely patent and contrast material drains into the bladder. There is no evidence of hydronephrosis. No filling defects to suggest nephrolithiasis. The tube was cut and removed over a Bentson wire. A  new Cook 10.2 French drainage catheter was advanced over the wire and formed in the renal pelvis. The left-sided tube was connected to gravity bag drainage while the right-sided tube was capped. Both tubes were held in place with adhesive retention devices and bandages. Sutures were deferred given skin breakdown and granulation tissue formation bilaterally. IMPRESSION: 1. Left-sided upper pole nephrolithiasis and distal nonobstructing ureteral stone. 2. Successful exchange of indwelling 10.2 French percutaneous nephrostomy tube. Patient will be undergoing left-sided PCNL in the next several weeks to months. 3. Patent right renal collecting system. No evidence of ureteral obstruction or hydronephrosis. 4. Successful exchange of right 10.2 French percutaneous nephrostomy tube. This tube was left capped to initiate a clinical trial prior to tube removal. This was discussed with her attending nephrologist, Dr. Franchot Gallo, via telephone at the time of the procedure. He is in agreement with this plan. PLAN: Return to interventional radiology in 2 weeks for clinical evaluation of the right-sided nephrostomy tube and repeat nephrostogram. If the patient is asymptomatic and the right ureter remains open, the nephrostomy tube can be removed at that time. Signed, Criselda Peaches, MD [facility] Radiology Electronically Signed   By: Jacqulynn Cadet M.D.   On: 07/07/2016 15:04   Ir Nephrostomy Exchange Right  Result Date:  07/07/2016 INDICATION: 56 year old female with multiple sclerosis, left-sided nephrolithiasis and history of prior left-sided ureteral obstruction secondary to a distal ureteral stone. Additionally, there was some concern for bladder perforation or urine leak and a right-sided nephrostomy tube was placed for maximal urinary diversion on 05/13/2016. She presents today for exchange of both nephrostomy tubes. EXAM: IR EXCHANGE NEPHROSTOMY LEFT; IR EXCHANGE NEPHROSTOMY RIGHT COMPARISON:  Most recent prior CT imaging 06/09/2016 MEDICATIONS: None. ANESTHESIA/SEDATION: None. CONTRAST:  Approximately 20 mL - administered into the collecting system(s) FLUOROSCOPY TIME:  Fluoroscopy Time: 2 minutes 6 seconds (11.9 mGy). COMPLICATIONS: None immediate. PROCEDURE: Informed written consent was obtained from the patient after a thorough discussion of the procedural risks, benefits and alternatives. All questions were addressed. Maximal Sterile Barrier Technique was utilized including caps, mask, sterile gowns, sterile gloves, sterile drape, hand hygiene and skin antiseptic. A timeout was performed prior to the initiation of the procedure. Attention was first turned to the existing left-sided percutaneous nephrostomy tube. A hand injection of contrast material was performed. The tube is nearly occluded. With some effort, contrast was injected into the renal collecting system. The drainage catheter is located in the upper pole infundibulum in the region of a large renal stone. There is no evidence of hydronephrosis. Contrast material drains through the ureter, around a nonobstructing distal ureteral stone and into the bladder. The tube was cut and removed over a stiff Glidewire. A new Cook 10.2 [place] multipurpose drainage catheter was advanced over the wire and positioned with the locking loop in the renal pelvis. A gentle hand injection of contrast material and a fluoroscopic saved image confirms tube location. Attention was next  turned to the right nephrostomy tube. A gentle hand injection of contrast material opacifies the right renal collecting system. The right-sided tube is not clogged. Again, the tube lies in an upper pole infundibulum. The ureter is widely patent and contrast material drains into the bladder. There is no evidence of hydronephrosis. No filling defects to suggest nephrolithiasis. The tube was cut and removed over a Bentson wire. A new Cook 10.2 French drainage catheter was advanced over the wire and formed in the renal pelvis. The left-sided tube was connected  to gravity bag drainage while the right-sided tube was capped. Both tubes were held in place with adhesive retention devices and bandages. Sutures were deferred given skin breakdown and granulation tissue formation bilaterally. IMPRESSION: 1. Left-sided upper pole nephrolithiasis and distal nonobstructing ureteral stone. 2. Successful exchange of indwelling 10.2 French percutaneous nephrostomy tube. Patient will be undergoing left-sided PCNL in the next several weeks to months. 3. Patent right renal collecting system. No evidence of ureteral obstruction or hydronephrosis. 4. Successful exchange of right 10.2 French percutaneous nephrostomy tube. This tube was left capped to initiate a clinical trial prior to tube removal. This was discussed with her attending nephrologist, Dr. Franchot Gallo, via telephone at the time of the procedure. He is in agreement with this plan. PLAN: Return to interventional radiology in 2 weeks for clinical evaluation of the right-sided nephrostomy tube and repeat nephrostogram. If the patient is asymptomatic and the right ureter remains open, the nephrostomy tube can be removed at that time. Signed, Criselda Peaches, MD [facility] Radiology Electronically Signed   By: Jacqulynn Cadet M.D.   On: 07/07/2016 15:04   Ct Image Guided Drainage By Percutaneous Catheter  Result Date:  07/27/2016 INDICATION: 56 year old with multiple sclerosis and bilateral nephrostomy tubes. Concern for a bladder perforation. Cystoscopy demonstrated bladder dome inflammation and urology requested placement of a suprapubic tube. EXAM: CT-GUIDED PLACEMENT OF SUPRAPUBIC BLADDER CATHETER COMPARISON:  None. MEDICATIONS: Patient was already on scheduled antibiotics. ANESTHESIA/SEDATION: Fentanyl 200 mcg IV; Versed 4.0 mg IV Moderate Sedation Time:  42 minutes The patient was continuously monitored during the procedure by the interventional radiology nurse under my direct supervision. CONTRAST:  None FLUOROSCOPY TIME:  None COMPLICATIONS: None immediate. PROCEDURE: Informed written consent was obtained from the patient after a thorough discussion of the procedural risks, benefits and alternatives. All questions were addressed. A timeout was performed prior to the initiation of the procedure. CT images through the pelvis were obtained. The urinary bladder was initially decompressed with a Foley catheter. As a result, 250 mL of sterile saline was instilled in the bladder through the Foley catheter. Once the bladder was adequately distended, the anterior pelvis was prepped and draped in a sterile fashion. Skin was anesthetized with 1% lidocaine. 18 gauge trocar needle was directed into the bladder with CT guidance and a stiff Amplatz wire was placed. The tract was dilated to accommodate a 12 [place] multipurpose drain. The drain was easily advanced over the wire and reconstituted in the bladder. Catheter was sutured to the skin with suture and attached to a gravity bag. The Foley catheter balloon was deflated and the Foley catheter was removed without complication. FINDINGS: Initial images demonstrated a decompressed urinary bladder with a Foley catheter. Bladder was adequate distended with normal saline. Evidence for multiple gas-filled bladder diverticula but no definite bladder leak. 12 French drain was successfully  advanced into the bladder. IMPRESSION: Successful placement of a CT-guided suprapubic bladder catheter. Electronically Signed   By: Markus Daft M.D.   On: 07/27/2016 12:06    Assessment/Plan Sepsis most likely due to urinary Infection. Patient has 2 nephrostomy tube draining well. She afebrile and White count is normal. She does have appointment with Urologist for Nephrolithiasis of Left renal calculi. The repeat Urine shows Yeast infection. Will start her on Diflucan for 14 days.  Left lower lobe pneumonia Patient doing well. No cough and No SOB. Will not repeat Chest Xray. Just follow her clinically.  Controlled type 2 diabetes mellitus Patient  actually has low BS mostly around 70. She says she does not feel symptoms of Hypoglycemia. Will reduce the dose of her Gipizide to 2.5 mg  Deep vein thrombosis (DVT) of left lower extremity,  Xarelto on hold as patient had Unexplained Anemia and also is going for procedure by her Urologist. Will continue to hold. If her Hgb Stays stable then can restart the Xarelto.  Multiple sclerosis diagnosed 2002-not on Therapy any longer Continue the supportive care Patient in good spirit.  Essential hypertension BP controlled lisinopril   Anemia due to chronic disease. Ferritin was high with Low TIBC. Hgb stable since transfusion Will repeat CBC GI procedure were normal.  Will arrange for follow up with GI   Hypokalemia and Hypomagnesia Will repeat labs.      Family/ staff Communication:   Labs/tests ordered:  CBC,BMP, Mg Total time spent in this patient care encounter was 45_ minutes; greater than 50% of the visit spent counseling patient and coordinating care for problems addressed at this encounter.

## 2016-08-05 LAB — CBC AND DIFFERENTIAL
HCT: 28 % — AB (ref 36–46)
Hemoglobin: 8.7 g/dL — AB (ref 12.0–16.0)
Platelets: 497 10*3/uL — AB (ref 150–399)
WBC: 11.5 10^3/mL

## 2016-08-05 LAB — BASIC METABOLIC PANEL
BUN: 14 mg/dL (ref 4–21)
CREATININE: 0.6 mg/dL (ref 0.5–1.1)
Glucose: 91 mg/dL
POTASSIUM: 4 mmol/L (ref 3.4–5.3)
SODIUM: 141 mmol/L (ref 137–147)

## 2016-08-08 ENCOUNTER — Non-Acute Institutional Stay (SKILLED_NURSING_FACILITY): Payer: Medicare Other | Admitting: Family

## 2016-08-08 ENCOUNTER — Encounter: Payer: Self-pay | Admitting: Family

## 2016-08-08 NOTE — Progress Notes (Signed)
Location:  [facility] Room Number: [id number] Place of Service:  SNF (31) Provider:  Herlinda Heady FNP-C  Blanchie Serve, MD  Patient Care Team: Blanchie Serve, MD as PCP - General (Internal Medicine) Franchot Gallo, MD as Consulting Physician (Urology)  Extended Emergency Contact Information Primary Emergency Contact: Hughes,Michael Address: [address]          [place], [place] [address] Johnnette Litter of [place] Phone: [phone] Mobile Phone: [phone] Relation: Son Secondary Emergency Contact: [facility] Address: [address] [place], [place] [address] Johnnette Litter of [place] Phone: [phone] Mobile Phone: [phone] Relation: Relative  Code Status:  Full code Goals of care: Advanced Directive information Advanced Directives 07/12/2016  Does Patient Have a Medical Advance Directive? Yes  Type of Advance Directive [place]  Does patient want to make changes to medical advance directive? No - Patient declined  Copy of [place] in Chart? (No Data)  Would patient like information on creating a medical advance directive? -  Pre-existing out of facility DNR order (yellow form or pink MOST form) -     Chief Complaint  Patient presents with  . Acute Visit    Abnormal labs    HPI:  Pt is a 56 y.o. female seen today at [facility] for an acute visit for evaluation of abnormal lab results. She has a significant medical history of Hydronephrosis with Nephrostomy tube, CKD stage 3 among other conditions. She seen in her room today. She denies any acute issues this visit. Her recent lab results showed Mg level 1.4 ( 08/05/2016). Facility Nurse reports no new concerns.   Past Medical History:  Diagnosis Date  . Acute renal failure superimposed on stage 3 chronic kidney disease ([place]) 07/21/2015  . Anemia    chronic   . Diabetes mellitus without  complication ([place])   . Diabetic neuropathy ([place])   . DVT (deep venous thrombosis) ([place])   . DVT (deep venous thrombosis) ([place])   . GERD (gastroesophageal reflux disease)   . Hx of sepsis   . Hypertension   . Left nephrolithiasis   . Leukocytosis   . Lymph edema    chronic   . Malnutrition of moderate degree 07/21/2015  . Multiple sclerosis diagnosed 2002-not on Therapy any longer 06/26/2012  . Muscle weakness (generalized)   . Neuromuscular disorder ([place])   . Neuromuscular dysfunction of bladder   . Polyneuropathy ([place])   . Presence of indwelling urinary catheter   . Protein calorie malnutrition ([place])   . Stage 4 decubitus ulcer ([place]) 07/05/2015  . Stroke ([place])   . UTI (lower urinary tract infection)    Past Surgical History:  Procedure Laterality Date  . COLONOSCOPY WITH PROPOFOL N/A 07/25/2016   Procedure: COLONOSCOPY WITH PROPOFOL;  Surgeon: Laurence Spates, MD;  Location: [facility];  Service: Endoscopy;  Laterality: N/A;  . ESOPHAGOGASTRODUODENOSCOPY Left 01/02/2015   Procedure: ESOPHAGOGASTRODUODENOSCOPY (EGD);  Surgeon: Arta Silence, MD;  Location: [facility];  Service: Endoscopy;  Laterality: Left;  . ESOPHAGOGASTRODUODENOSCOPY (EGD) WITH PROPOFOL N/A 07/25/2016   Procedure: ESOPHAGOGASTRODUODENOSCOPY (EGD) WITH PROPOFOL;  Surgeon: Laurence Spates, MD;  Location: [facility];  Service: Endoscopy;  Laterality: N/A;  . HERNIA MESH REMOVAL    . IR GENERIC HISTORICAL  02/23/2016   IR NEPHROSTOMY EXCHANGE LEFT 02/23/2016 Sandi Mariscal, MD [facility]  . IR GENERIC HISTORICAL  04/19/2016   IR NEPHROSTOMY  EXCHANGE LEFT 04/19/2016 Arne Cleveland, MD [facility]  . IR GENERIC HISTORICAL  05/13/2016   IR NEPHROSTOMY EXCHANGE LEFT 05/13/2016 Corrie Mckusick, DO [facility]  . IR GENERIC HISTORICAL  05/13/2016   IR NEPHROSTOMY PLACEMENT RIGHT 05/13/2016 Corrie Mckusick, DO [facility]  . IR GENERIC HISTORICAL  07/07/2016   IR NEPHROSTOMY EXCHANGE RIGHT 07/07/2016 Jacqulynn Cadet, MD [facility]  . IR GENERIC HISTORICAL  07/07/2016   IR NEPHROSTOMY EXCHANGE LEFT 07/07/2016 Jacqulynn Cadet, MD [facility]  . NEPHROSTOMY TUBE PLACEMENT ([place] HX)    . TRANSURETHRAL RESECTION OF BLADDER TUMOR N/A 03/16/2016   Procedure: CYSTOSCOPY BLADDER BIOPSY;  Surgeon: Franchot Gallo, MD;  Location: [facility];  Service: Urology;  Laterality: N/A;  . TUBAL LIGATION    . tubes tided      No Known Allergies  Allergies as of 08/08/2016   No Known Allergies     Medication List       Accurate as of 08/08/16  3:06 PM. Always use your most recent med list.          acetaminophen 325 MG tablet Commonly known as:  TYLENOL Take 2 tablets (650 mg total) by mouth every 6 (six) hours as needed for fever.   albuterol (2.5 MG/3ML) 0.083% nebulizer solution Commonly known as:  PROVENTIL Take 3 mLs (2.5 mg total) by nebulization every 6 (six) hours as needed for wheezing or shortness of breath.   atorvastatin 10 MG tablet Commonly known as:  LIPITOR Take 10 mg by mouth at bedtime.   famotidine 20 MG tablet Commonly known as:  PEPCID Take 1 tablet (20 mg total) by mouth 2 (two) times daily.   fluconazole 200 MG tablet Commonly known as:  DIFLUCAN Take 200 mg by mouth daily. Stop date 08/18/16   gabapentin 100 MG capsule Commonly known as:  NEURONTIN Take 1 capsule (100 mg total) by mouth at bedtime.   glipiZIDE 2.5 mg Tabs tablet Commonly known as:  GLUCOTROL Take 2.5 mg by mouth daily before breakfast.   HYDROcodone-homatropine 5-1.5 MG/5ML syrup Commonly known as:  HYCODAN Take 5 mLs by mouth every 6 (six) hours as needed for cough.   lisinopril 2.5 MG tablet Commonly known as:  PRINIVIL,ZESTRIL Take 2.5 mg by mouth daily.   metFORMIN 500 MG tablet Commonly known as:  GLUCOPHAGE Take 0.5 tablets (250 mg total) by mouth 2 (two) times daily.   multivitamin with minerals Tabs tablet Take 1 tablet by mouth daily.   nystatin cream Commonly known as:  MYCOSTATIN Apply 1  application topically 2 (two) times daily. Apply to groin and buttock twice daily for fungal rash   polyethylene glycol packet Commonly known as:  MIRALAX / GLYCOLAX Take 17 g by mouth 2 (two) times daily.   potassium chloride 10 MEQ tablet Commonly known as:  K-DUR,KLOR-CON Take 1 tablet (10 mEq total) by mouth daily.       Review of Systems  Constitutional: Negative for activity change, appetite change, chills, fatigue and fever.  HENT: Negative for congestion, rhinorrhea, sinus pressure, sneezing and sore throat.   Eyes: Negative.   Respiratory: Negative for cough, chest tightness, shortness of breath and wheezing.   Cardiovascular: Negative for chest pain, palpitations and leg swelling.  Gastrointestinal: Negative for abdominal distention, abdominal pain, constipation, diarrhea, nausea and vomiting.  Genitourinary: Negative for flank pain and urgency.       Foley Catheter and Left Nephrostomy   Musculoskeletal: Positive for gait problem.  Skin: Negative for color change,  pallor, rash and wound.  Neurological: Negative for dizziness, seizures, syncope, light-headedness, numbness and headaches.  Hematological: Does not bruise/bleed easily.  Psychiatric/Behavioral: Negative for agitation, confusion, hallucinations and sleep disturbance. The patient is not nervous/anxious.     Immunization History  Administered Date(s) Administered  . Influenza Split 06/26/2012  . Influenza-Unspecified 04/02/2015  . PPD Test 01/04/2015, 07/09/2015, 07/23/2015, 09/02/2015, 12/31/2015, 07/28/2016  . Pneumococcal-Unspecified 03/13/2014   Pertinent  Health Maintenance Due  Topic Date Due  . FOOT EXAM  10/19/1968  . OPHTHALMOLOGY EXAM  10/19/1968  . PAP SMEAR  10/20/1979  . MAMMOGRAM  09/19/2015  . INFLUENZA VACCINE  01/12/2016  . HEMOGLOBIN A1C  01/12/2017  . COLONOSCOPY  07/25/2026      Vitals:   08/08/16 1051  BP: 122/76  Pulse: 81  Resp: 19  Temp: 98.7 F (37.1 C)  TempSrc: Oral    SpO2: 95%  Weight: 137 lb (62.1 kg)  Height: 5\' 6"  (1.676 m)   Body mass index is 22.11 kg/m. Physical Exam  Constitutional: She is oriented to person, place, and time. She appears well-developed and well-nourished. No distress.  HENT:  Head: Normocephalic.  Mouth/Throat: Oropharynx is clear and moist. No oropharyngeal exudate.  Eyes: Conjunctivae and EOM are normal. Pupils are equal, round, and reactive to light. Right eye exhibits no discharge. Left eye exhibits no discharge. No scleral icterus.  Neck: Normal range of motion. No JVD present. No thyromegaly present.  Cardiovascular: Normal rate, regular rhythm, normal heart sounds and intact distal pulses.  Exam reveals no gallop and no friction rub.   No murmur heard. Pulmonary/Chest: Effort normal and breath sounds normal. No respiratory distress. She has no wheezes. She has no rales.  Abdominal: Soft. Bowel sounds are normal. She exhibits no distension. There is no rebound.  Genitourinary:  Genitourinary Comments: Foley cathter and Left Nephrostomy draining clear yellow urine   Musculoskeletal: She exhibits no edema, tenderness or deformity.  RUE /Bilateral LE's weakness. Uses Power wheelchair   Lymphadenopathy:    She has no cervical adenopathy.  Neurological: She is oriented to person, place, and time.  Skin: Skin is warm and dry. No rash noted. No erythema. No pallor.  Psychiatric: She has a normal mood and affect.    Labs reviewed:  Recent Labs  05/08/16 1800  07/15/16 0500  07/26/16 0519 07/27/16 0509 07/28/16 0435 08/05/16  NA  --   < > 140  < > 143 142 145 141  K  --   < > 3.9  < > 3.2* 3.2* 3.3* 4.0  CL  --   < > 113*  < > 113* 113* 117*  --   CO2  --   < > 21*  < > 26 25 23   --   GLUCOSE  --   < > 92  < > 106* 105* 106*  --   BUN  --   < > 14  < > 8 7 5* 14  CREATININE  --   < > 0.88  < > 0.65 0.71 0.60 0.6  CALCIUM  --   < > 9.3  < > 9.4 9.4 9.5  --   MG 2.0  --  1.7  --   --   --  1.4*  --   PHOS 2.2*   --  2.6  --   --   --  2.5  --   < > = values in this interval not displayed.  Recent Labs  07/12/16 1239 07/16/16 0749  07/28/16 0435  AST 32 30 11*  ALT 11* 47 14  ALKPHOS 41 136* 58  BILITOT 1.3* 0.6 0.9  PROT 5.2* 6.0* 7.1  ALBUMIN 2.0* 1.9* 2.4*    Recent Labs  07/19/16 0426 07/20/16 0414  07/26/16 0519 07/27/16 0509 07/28/16 0435 08/05/16  WBC 12.5* 10.6*  < > 10.8* 8.9 9.4 11.5  NEUTROABS 9.0* 6.9  --   --   --  5.2  --   HGB 8.2* 7.9*  < > 7.6* 7.6* 7.8* 8.7*  HCT 24.8* 25.2*  < > 23.9* 23.5* 24.3* 28*  MCV 88.3 87.8  < > 87.9 87.7 85.6  --   PLT 443* 447*  < > 390 389 377 497*  < > = values in this interval not displayed. Lab Results  Component Value Date   TSH 1.787 07/21/2015   Lab Results  Component Value Date   HGBA1C 5.3 07/15/2016   Lab Results  Component Value Date   CHOL 169 02/18/2016   HDL 52 02/18/2016   LDLCALC 95 02/18/2016   TRIG 109 02/18/2016   Assessment/Plan  Hypomagnesemia Recent Lab results Mg 1.4 will start magnesium oxide 400 mg Tablet x 1 dose now. Repeat Mg level 08/11/2016. Continue to monitor   Family/ staff Communication: Reviewed plan of care with patient and facility Nurse supervisor.   Labs/tests ordered:  Mg level 08/11/2016

## 2016-08-11 LAB — MAGNESIUM
MAGNESIUM: 1.4
MAGNESIUM: 1.4

## 2016-08-15 ENCOUNTER — Inpatient Hospital Stay (HOSPITAL_COMMUNITY)
Admission: RE | Admit: 2016-08-15 | Discharge: 2016-08-19 | DRG: 661 | Disposition: A | Discharge status: Skilled Nursing Facility | Payer: Medicare Other | Source: Ambulatory Visit | Attending: Urology | Admitting: Urology

## 2016-08-15 ENCOUNTER — Encounter (HOSPITAL_COMMUNITY): Payer: Self-pay

## 2016-08-15 ENCOUNTER — Ambulatory Visit: Payer: Self-pay | Admitting: Urology

## 2016-08-15 DIAGNOSIS — Z833 Family history of diabetes mellitus: Secondary | ICD-10-CM

## 2016-08-15 DIAGNOSIS — Z820 Family history of epilepsy and other diseases of the nervous system: Secondary | ICD-10-CM | POA: Diagnosis not present

## 2016-08-15 DIAGNOSIS — N3289 Other specified disorders of bladder: Secondary | ICD-10-CM | POA: Diagnosis present

## 2016-08-15 DIAGNOSIS — N200 Calculus of kidney: Secondary | ICD-10-CM | POA: Insufficient documentation

## 2016-08-15 DIAGNOSIS — E1140 Type 2 diabetes mellitus with diabetic neuropathy, unspecified: Secondary | ICD-10-CM | POA: Diagnosis present

## 2016-08-15 DIAGNOSIS — K219 Gastro-esophageal reflux disease without esophagitis: Secondary | ICD-10-CM | POA: Diagnosis present

## 2016-08-15 DIAGNOSIS — Z8673 Personal history of transient ischemic attack (TIA), and cerebral infarction without residual deficits: Secondary | ICD-10-CM | POA: Diagnosis not present

## 2016-08-15 DIAGNOSIS — Z87891 Personal history of nicotine dependence: Secondary | ICD-10-CM | POA: Diagnosis not present

## 2016-08-15 DIAGNOSIS — G35 Multiple sclerosis: Secondary | ICD-10-CM | POA: Diagnosis present

## 2016-08-15 DIAGNOSIS — E1122 Type 2 diabetes mellitus with diabetic chronic kidney disease: Secondary | ICD-10-CM | POA: Diagnosis present

## 2016-08-15 DIAGNOSIS — Z87440 Personal history of urinary (tract) infections: Secondary | ICD-10-CM | POA: Diagnosis not present

## 2016-08-15 DIAGNOSIS — Z7984 Long term (current) use of oral hypoglycemic drugs: Secondary | ICD-10-CM | POA: Diagnosis not present

## 2016-08-15 DIAGNOSIS — N202 Calculus of kidney with calculus of ureter: Principal | ICD-10-CM | POA: Diagnosis present

## 2016-08-15 DIAGNOSIS — N183 Chronic kidney disease, stage 3 (moderate): Secondary | ICD-10-CM | POA: Diagnosis present

## 2016-08-15 DIAGNOSIS — Z79899 Other long term (current) drug therapy: Secondary | ICD-10-CM

## 2016-08-15 DIAGNOSIS — I129 Hypertensive chronic kidney disease with stage 1 through stage 4 chronic kidney disease, or unspecified chronic kidney disease: Secondary | ICD-10-CM | POA: Diagnosis present

## 2016-08-15 DIAGNOSIS — E1151 Type 2 diabetes mellitus with diabetic peripheral angiopathy without gangrene: Secondary | ICD-10-CM | POA: Diagnosis present

## 2016-08-15 LAB — CBC WITH DIFFERENTIAL/PLATELET
BASOS ABS: 0.1 10*3/uL (ref 0.0–0.1)
BASOS PCT: 1 %
EOS PCT: 6 %
Eosinophils Absolute: 0.7 10*3/uL (ref 0.0–0.7)
HCT: 29.5 % — ABNORMAL LOW (ref 36.0–46.0)
Hemoglobin: 9.3 g/dL — ABNORMAL LOW (ref 12.0–15.0)
Lymphocytes Relative: 36 %
Lymphs Abs: 4.2 10*3/uL — ABNORMAL HIGH (ref 0.7–4.0)
MCH: 28 pg (ref 26.0–34.0)
MCHC: 31.5 g/dL (ref 30.0–36.0)
MCV: 88.9 fL (ref 78.0–100.0)
MONO ABS: 0.9 10*3/uL (ref 0.1–1.0)
Monocytes Relative: 8 %
Neutro Abs: 5.6 10*3/uL (ref 1.7–7.7)
Neutrophils Relative %: 49 %
PLATELETS: 355 10*3/uL (ref 150–400)
RBC: 3.32 MIL/uL — ABNORMAL LOW (ref 3.87–5.11)
RDW: 17.4 % — AB (ref 11.5–15.5)
WBC: 11.5 10*3/uL — ABNORMAL HIGH (ref 4.0–10.5)

## 2016-08-15 LAB — COMPREHENSIVE METABOLIC PANEL
ALBUMIN: 3.4 g/dL — AB (ref 3.5–5.0)
ALK PHOS: 61 U/L (ref 38–126)
ALT: 13 U/L — AB (ref 14–54)
AST: 19 U/L (ref 15–41)
Anion gap: 5 (ref 5–15)
BILIRUBIN TOTAL: 0.4 mg/dL (ref 0.3–1.2)
BUN: 23 mg/dL — AB (ref 6–20)
CALCIUM: 10.6 mg/dL — AB (ref 8.9–10.3)
CO2: 25 mmol/L (ref 22–32)
Chloride: 107 mmol/L (ref 101–111)
Creatinine, Ser: 0.74 mg/dL (ref 0.44–1.00)
GFR calc Af Amer: >60 mL/min (ref >60)
GFR calc non Af Amer: >60 mL/min (ref >60)
GLUCOSE: 105 mg/dL — AB (ref 65–99)
Potassium: 4.1 mmol/L (ref 3.5–5.1)
Sodium: 137 mmol/L (ref 135–145)
TOTAL PROTEIN: 8.1 g/dL (ref 6.5–8.1)

## 2016-08-15 LAB — GLUCOSE, CAPILLARY: Glucose-Capillary: 141 mg/dL — ABNORMAL HIGH (ref 65–99)

## 2016-08-15 LAB — MRSA PCR SCREENING: MRSA by PCR: NEGATIVE

## 2016-08-15 MED ORDER — FAMOTIDINE 20 MG PO TABS
20.0000 mg | ORAL_TABLET | Freq: Two times a day (BID) | ORAL | Status: DC
  Administered 2016-08-15 – 2016-08-19 (×8): 20 mg via ORAL
  Filled 2016-08-15 (×8): qty 1

## 2016-08-15 MED ORDER — POLYETHYLENE GLYCOL 3350 17 G PO PACK
17.0000 g | PACK | Freq: Two times a day (BID) | ORAL | Status: DC
  Filled 2016-08-15 (×4): qty 1

## 2016-08-15 MED ORDER — GENTAMICIN SULFATE 40 MG/ML IJ SOLN: 300.0000 mg | INTRAVENOUS | Status: DC

## 2016-08-15 MED ORDER — SENNA 8.6 MG PO TABS
1.0000 | ORAL_TABLET | Freq: Two times a day (BID) | ORAL | Status: DC
  Administered 2016-08-16: 8.6 mg via ORAL
  Filled 2016-08-15 (×6): qty 1

## 2016-08-15 MED ORDER — ACETAMINOPHEN 325 MG PO TABS: 650.0000 mg | ORAL_TABLET | ORAL | Status: DC | PRN

## 2016-08-15 MED ORDER — FLUCONAZOLE 100 MG PO TABS
100.0000 mg | ORAL_TABLET | Freq: Every day | ORAL | Status: DC
  Administered 2016-08-15 – 2016-08-19 (×5): 100 mg via ORAL
  Filled 2016-08-15 (×5): qty 1

## 2016-08-15 MED ORDER — SODIUM CHLORIDE 0.9 % IV SOLN
250.0000 mL | INTRAVENOUS | Status: AC | PRN
  Administered 2016-08-18 (×3): via INTRAVENOUS

## 2016-08-15 MED ORDER — GLIPIZIDE 5 MG PO TABS: 2.5000 mg | ORAL_TABLET | Freq: Every day | ORAL | Status: DC

## 2016-08-15 MED ORDER — ADULT MULTIVITAMIN W/MINERALS CH
1.0000 | ORAL_TABLET | Freq: Every day | ORAL | Status: DC
  Administered 2016-08-16 – 2016-08-19 (×2): 1 via ORAL
  Filled 2016-08-15 (×3): qty 1

## 2016-08-15 MED ORDER — NYSTATIN 100000 UNIT/GM EX CREA
1.0000 "application " | TOPICAL_CREAM | Freq: Two times a day (BID) | CUTANEOUS | Status: DC
  Administered 2016-08-15 – 2016-08-19 (×8): 1 via TOPICAL
  Filled 2016-08-15: qty 15

## 2016-08-15 MED ORDER — GENTAMICIN SULFATE 40 MG/ML IJ SOLN
300.0000 mg | INTRAVENOUS | Status: DC
  Administered 2016-08-16: 300 mg via INTRAVENOUS
  Filled 2016-08-15: qty 7.5

## 2016-08-15 MED ORDER — SODIUM CHLORIDE 0.9% FLUSH: 3.0000 mL | Freq: Two times a day (BID) | INTRAVENOUS | Status: AC

## 2016-08-15 MED ORDER — ENOXAPARIN SODIUM 150 MG/ML ~~LOC~~ SOLN: 40.0000 mg | SUBCUTANEOUS | Status: AC

## 2016-08-15 MED ORDER — ATORVASTATIN CALCIUM 10 MG PO TABS
10.0000 mg | ORAL_TABLET | Freq: Every day | ORAL | Status: DC
  Administered 2016-08-15 – 2016-08-18 (×4): 10 mg via ORAL
  Filled 2016-08-15 (×4): qty 1

## 2016-08-15 MED ORDER — ENOXAPARIN SODIUM 40 MG/0.4ML ~~LOC~~ SOLN
40.0000 mg | SUBCUTANEOUS | Status: DC
  Administered 2016-08-15 – 2016-08-17 (×3): 40 mg via SUBCUTANEOUS
  Filled 2016-08-15 (×3): qty 0.4

## 2016-08-15 MED ORDER — HYDROCODONE-HOMATROPINE 5-1.5 MG/5ML PO SYRP: 5.0000 mL | ORAL_SOLUTION | Freq: Four times a day (QID) | ORAL | Status: DC | PRN

## 2016-08-15 MED ORDER — METFORMIN HCL 500 MG PO TABS
250.0000 mg | ORAL_TABLET | Freq: Two times a day (BID) | ORAL | Status: DC
  Administered 2016-08-16 – 2016-08-19 (×6): 250 mg via ORAL
  Filled 2016-08-15 (×6): qty 1

## 2016-08-15 MED ORDER — SODIUM CHLORIDE 0.9% FLUSH
10.0000 mL | INTRAVENOUS | Status: DC | PRN
  Administered 2016-08-15 – 2016-08-19 (×4): 10 mL
  Filled 2016-08-15 (×4): qty 40

## 2016-08-15 MED ORDER — POTASSIUM CHLORIDE CRYS ER 10 MEQ PO TBCR
10.0000 meq | EXTENDED_RELEASE_TABLET | Freq: Every day | ORAL | Status: DC
  Administered 2016-08-16 – 2016-08-19 (×4): 10 meq via ORAL
  Filled 2016-08-15 (×4): qty 1

## 2016-08-15 MED ORDER — GENTAMICIN SULFATE 40 MG/ML IJ SOLN
300.0000 mg | INTRAVENOUS | Status: DC
  Filled 2016-08-15: qty 7.5

## 2016-08-15 MED ORDER — LISINOPRIL 5 MG PO TABS
2.5000 mg | ORAL_TABLET | Freq: Every day | ORAL | Status: DC
  Administered 2016-08-16 – 2016-08-19 (×4): 2.5 mg via ORAL
  Filled 2016-08-15 (×4): qty 1

## 2016-08-15 MED ORDER — GABAPENTIN 100 MG PO CAPS
100.0000 mg | ORAL_CAPSULE | Freq: Every day | ORAL | Status: DC
  Administered 2016-08-15 – 2016-08-18 (×4): 100 mg via ORAL
  Filled 2016-08-15 (×4): qty 1

## 2016-08-15 MED ORDER — ALBUTEROL SULFATE (2.5 MG/3ML) 0.083% IN NEBU: 2.5000 mg | INHALATION_SOLUTION | Freq: Four times a day (QID) | RESPIRATORY_TRACT | Status: DC | PRN

## 2016-08-15 MED ORDER — SODIUM CHLORIDE 0.9% FLUSH: 3.0000 mL | INTRAVENOUS | Status: AC | PRN

## 2016-08-15 MED ORDER — ACETAMINOPHEN 325 MG PO TABS: 650.0000 mg | ORAL_TABLET | Freq: Four times a day (QID) | ORAL | Status: DC | PRN

## 2016-08-15 NOTE — Progress Notes (Signed)
Patient is A&Ox4, uses motorized wheelchair. Skin assessed, over all dry. Healed areas to sacrum buttocks pink and dry.

## 2016-08-15 NOTE — Plan of Care (Signed)
Problem: Safety: Goal: Ability to remain free from injury will improve Outcome: Completed/Met Date Met: 08/15/16 Bed alarm set. Discussed safety prevention with family

## 2016-08-15 NOTE — Clinical Social Work Note (Signed)
Clinical Social Work Assessment  Patient Details  Name: [name] MRN: [id number] Date of Birth: [birth year]  Date of referral:  08/15/16               Reason for consult:  Facility Placement                Permission sought to share information with:  Facility Art therapist granted to share information::  Yes, Verbal Permission Granted  Name::        Agency::     Relationship::     Contact Information:     Housing/Transportation Living arrangements for the past 2 months:  [place] of Information:  Patient Patient Interpreter Needed:  None Criminal Activity/Legal Involvement Pertinent to Current Situation/Hospitalization:  No - Comment as needed Significant Relationships:  Other Family Members Lives with:  Facility Resident Do you feel safe going back to the place where you live?  Yes Need for family participation in patient care:  No (Coment)  Care giving concerns:  Patient reported that her doctor had concerns about her getting at adequate care at home and that she went to SNF to get adequate care.    Social Worker assessment / plan:  CSW spoke with patient at bedside. Patient reports that she has been residing at [facility] for about 1 year after selling home. Patient reports that she plans to return to [facility] when discharged from hospital. CSW will complete FL2 and follow patient for discharge needs.  Employment status:  Disabled (Comment on whether or not currently receiving Disability) Insurance information:  Medicaid In [place], [employer] PT Recommendations:  Not assessed at this time Information / Referral to community resources:  [place]  Patient/Family's Response to care:  Patient agreeable to SNF. Patient reported no concerns with returning to current SNF noting that she is getting the care that she needs there.   Patient/Family's Understanding of and Emotional Response to Diagnosis, Current  Treatment, and Prognosis:  Patient reported that she is tired of coming to the hospital noting multiple trips to hospital. CSW inquired about how patient was coping with the frequent visits to hospital, patient reported that she relies on her faith and family support. Patient pleasant when speaking with CSW and reported that she is hopeful that this will be her last trip to the hospital and that she will be better after having kidney stones removed. Patient reported that she is used to residing at [facility] and knows she needs to be there to be cared for.   Emotional Assessment Appearance:  Appears stated age Attitude/Demeanor/Rapport:  Other (Cooperative; Open;) Affect (typically observed):  Pleasant, Hopeful, Appropriate Orientation:  Oriented to Self, Oriented to Place, Oriented to  Time, Oriented to Situation Alcohol / Substance use:  Not Applicable Psych involvement (Current and /or in the community):  No (Comment)  Discharge Needs  Concerns to be addressed:  No discharge needs identified Readmission within the last 30 days:  No Current discharge risk:  None Barriers to Discharge:  No Barriers Identified   Burnis Medin, LCSW 08/15/2016, 4:11 PM

## 2016-08-15 NOTE — H&P (Signed)
H&P  Chief Complaint: Kidney stone  History of Present Illness: [name] is a 56 y.o. year old female who is admitted in advance of surgery to remove a large left renal stone in 3 days.  The patient has a history of a large left renal stone is low as well as a left distal ureteral stone.  She has a percutaneous nephrostomy tube in place bilaterally.  Additionally, she has a bladder perforation, most likely from chronic inflammation and Foley catheter management.  This has, due to associated urinary tract/pelvic infections, precluded surgery to remove her left renal stone.  The patient does have multiple sclerosis.  She has been scheduled for left percutaneous nephrolithotomy.  At least 3 times in the past 6 months, all of these have been canceled due to the patient having urinary tract infections or a pelvic phlegmon from her bladder leak.  She now has drainage of both kidneys with percutaneous nephrostomy tubes, and her most recent hospitalization was for a urinary tract infection as well as this phlegmon.  She was recently scheduled, 2 weeks ago, for a surgical procedure, this was canceled due to having a yeast UTI, which was adequately treated.  She presents at this time for admission several days before surgery to obtain proper urine cultures, and to be pretreated with IV antibiotics.  Past Medical History:  Diagnosis Date  . Acute renal failure superimposed on stage 3 chronic kidney disease ([place]) 07/21/2015  . Anemia    chronic   . Diabetes mellitus without complication ([place])   . Diabetic neuropathy ([place])   . DVT (deep venous thrombosis) ([place])   . DVT (deep venous thrombosis) ([place])   . GERD (gastroesophageal reflux disease)   . Hx of sepsis   . Hypertension   . Left nephrolithiasis   . Leukocytosis   . Lymph edema    chronic   . Malnutrition of moderate degree 07/21/2015  . Multiple sclerosis diagnosed 2002-not on Therapy any longer 06/26/2012  . Muscle weakness (generalized)   .  Neuromuscular disorder ([place])   . Neuromuscular dysfunction of bladder   . Polyneuropathy ([place])   . Presence of indwelling urinary catheter   . Protein calorie malnutrition ([place])   . Stage 4 decubitus ulcer ([place]) 07/05/2015  . Stroke ([place])   . UTI (lower urinary tract infection)     Past Surgical History:  Procedure Laterality Date  . COLONOSCOPY WITH PROPOFOL N/A 07/25/2016   Procedure: COLONOSCOPY WITH PROPOFOL;  Surgeon: Laurence Spates, MD;  Location: [facility];  Service: Endoscopy;  Laterality: N/A;  . ESOPHAGOGASTRODUODENOSCOPY Left 01/02/2015   Procedure: ESOPHAGOGASTRODUODENOSCOPY (EGD);  Surgeon: Arta Silence, MD;  Location: [facility];  Service: Endoscopy;  Laterality: Left;  . ESOPHAGOGASTRODUODENOSCOPY (EGD) WITH PROPOFOL N/A 07/25/2016   Procedure: ESOPHAGOGASTRODUODENOSCOPY (EGD) WITH PROPOFOL;  Surgeon: Laurence Spates, MD;  [facility];  Service: Endoscopy;  Laterality: N/A;  . HERNIA MESH REMOVAL    . IR GENERIC HISTORICAL  02/23/2016   IR NEPHROSTOMY EXCHANGE LEFT 02/23/2016 Sandi Mariscal, MD [facility]  . IR GENERIC HISTORICAL  04/19/2016   IR NEPHROSTOMY EXCHANGE LEFT 04/19/2016 Arne Cleveland, MD [facility]  . IR GENERIC HISTORICAL  05/13/2016   IR NEPHROSTOMY EXCHANGE LEFT 05/13/2016 Corrie Mckusick, DO [facility]  . IR GENERIC HISTORICAL  05/13/2016   IR NEPHROSTOMY PLACEMENT RIGHT 05/13/2016 Corrie Mckusick, DO [facility]  . IR GENERIC HISTORICAL  07/07/2016   IR NEPHROSTOMY EXCHANGE RIGHT 07/07/2016 Jacqulynn Cadet, MD [facility]  . IR GENERIC  HISTORICAL  07/07/2016   IR NEPHROSTOMY EXCHANGE LEFT 07/07/2016 Jacqulynn Cadet, MD [facility]  . NEPHROSTOMY TUBE PLACEMENT ([place] HX)    . TRANSURETHRAL RESECTION OF BLADDER TUMOR N/A 03/16/2016   Procedure: CYSTOSCOPY BLADDER BIOPSY;  Surgeon: Franchot Gallo, MD;  [facility];  Service: Urology;  Laterality: N/A;  . TUBAL LIGATION    . tubes tided      Home Medications:  Medications Prior to  Admission  Medication Sig Dispense Refill  . acetaminophen (TYLENOL) 325 MG tablet Take 2 tablets (650 mg total) by mouth every 6 (six) hours as needed for fever.    . famotidine (PEPCID) 20 MG tablet Take 1 tablet (20 mg total) by mouth 2 (two) times daily. 60 tablet 0  . lisinopril (PRINIVIL,ZESTRIL) 2.5 MG tablet Take 2.5 mg by mouth daily.   0  . Magnesium 400 MG TABS Take 400 mg by mouth daily.    . metFORMIN (GLUCOPHAGE) 500 MG tablet Take 0.5 tablets (250 mg total) by mouth 2 (two) times daily.    . Multiple Vitamin (MULTIVITAMIN WITH MINERALS) TABS tablet Take 1 tablet by mouth daily. 30 tablet 0  . nystatin cream (MYCOSTATIN) Apply 1 application topically 2 (two) times daily. Apply to groin and buttock twice daily for fungal rash    . polyethylene glycol (MIRALAX / GLYCOLAX) packet Take 17 g by mouth 2 (two) times daily. 14 each 0  . potassium chloride (K-DUR,KLOR-CON) 10 MEQ tablet Take 1 tablet (10 mEq total) by mouth daily.    Marland Kitchen albuterol (PROVENTIL) (2.5 MG/3ML) 0.083% nebulizer solution Take 3 mLs (2.5 mg total) by nebulization every 6 (six) hours as needed for wheezing or shortness of breath. 75 mL 12  . atorvastatin (LIPITOR) 10 MG tablet Take 10 mg by mouth at bedtime.     . fluconazole (DIFLUCAN) 200 MG tablet Take 200 mg by mouth daily. Stop date 08/18/16    . gabapentin (NEURONTIN) 100 MG capsule Take 1 capsule (100 mg total) by mouth at bedtime. (Patient not taking: Reported on 08/15/2016) 30 capsule 0  . glipiZIDE (GLUCOTROL) 2.5 mg TABS tablet Take 2.5 mg by mouth daily before breakfast.    . HYDROcodone-homatropine (HYCODAN) 5-1.5 MG/5ML syrup Take 5 mLs by mouth every 6 (six) hours as needed for cough. (Patient not taking: Reported on 08/15/2016) 120 mL 0    Allergies: No Known Allergies  Family History  Problem Relation Age of Onset  . Diabetes Mother   . Alzheimer's disease Mother   . Diabetes Father   . Diabetes Sister   . Scoliosis Sister     Social History:   reports that she quit smoking about 20 months ago. Her smoking use included Cigarettes. She has a 20.00 pack-year smoking history. She has never used smokeless tobacco. She reports that she drinks about 3.0 oz of alcohol per week . She reports that she does not use drugs.  ROS: The patient currently denies abdominal or flank pain.  She is bed/wheelchair bound.  She is had no recent fevers.  Physical Exam:  Vital signs in last 24 hours: @VSRANGES @ General:  Alert and oriented, No acute distress HEENT: Normocephalic, atraumatic Neck: No JVD or lymphadenopathy Cardiovascular: Regular rate and rhythm Lungs: Normal inspiratory and expiratory excursion Abdomen: Soft, nontender, nondistended, no abdominal masses   Laboratory Data:  Results for orders placed or performed during the hospital encounter of 08/15/16 (from the past 24 hour(s))  Glucose, capillary     Status: Abnormal   Collection  Time: 08/15/16  5:45 PM  Result Value Ref Range   Glucose-Capillary 141 (H) 65 - 99 mg/dL   No results found for this or any previous visit (from the past 240 hour(s)). Creatinine: No results for input(s): CREATININE in the last 168 hours.  Radiologic Imaging: No results found.  Impression/Assessment:  1.  Left renal and distal ureteral calculi, with nephrostomy tube in place.  Surgical management pending.  2.  Bladder perforation, most likely secondary to tonic inflammation and catheter drainage.  The patient now has a suprapubic tube in  3.  Multiple urinary tract infections, recently treated  Plan:  1.  Admit for preoperative IV antibiotics.  We will first obtain urine cultures from her suprapubic tube as well as bilateral nephrostomy tubes.  2.  She will also be covered with Diflucan for yeast, which was recently grown and treated  Jorja Loa 08/15/2016, 6:52 PM  Lillette Boxer. Canio Winokur MD

## 2016-08-15 NOTE — Progress Notes (Signed)
Pharmacy Antibiotic Note  [name] is a 56 y.o. female admitted on 08/15/2016 with UTI.  Pharmacy has been consulted for gentamicin dosing. She is admitted for IV antibiotics for upcoming ureteral stone removal. She has had several UTIs that have resulted in her surgery being delayed.  She has BL nephrostomy tubes.  Her most recent Urine cx from 2/16 grew >100k yeast.    Today, 08/15/2016  SCr WNL on recent check  WBC WNL  Cultures to be obtained from suprapubic and nephrostomy tubes  Plan:  Await results of Cmet  Gentamicin 300mg  IV q24h (5mg /kg)  Plan to check gentamicin level after 3rd dose  Monitor renal function - check daily SCr  Temp (24hrs), Avg:97.9 F (36.6 C), Min:97.9 F (36.6 C), Max:97.9 F (36.6 C)  No results for input(s): WBC, CREATININE, LATICACIDVEN, VANCOTROUGH, VANCOPEAK, VANCORANDOM, GENTTROUGH, GENTPEAK, GENTRANDOM, TOBRATROUGH, TOBRAPEAK, TOBRARND, AMIKACINPEAK, AMIKACINTROU, AMIKACIN in the last 168 hours.  Estimated Creatinine Clearance: 72.6 mL/min (by C-G formula based on SCr of 0.6 mg/dL).    No Known Allergies  Antimicrobials this admission: 3/5 gentamicin >>  Dose adjustments this admission:  Microbiology results: 2/15 Ucx: yeast (obtained from suprapubic, and both nephrostomy tubes) 3/5 UCx:   Thank you for allowing pharmacy to be a part of this patient's care.  Doreene Eland, PharmD, BCPS.   Pager: [phone] 08/15/2016 2:34 PM

## 2016-08-15 NOTE — Progress Notes (Addendum)
Patient has poor venous access. Pt has been stuck x4 without success. Primary RN and IV RN unable. Anesthesia office contacted to assist. Provider paged for further recommendation.

## 2016-08-15 NOTE — Progress Notes (Signed)
Peripherally Inserted Central Catheter/Midline Placement  The IV Nurse has discussed with the patient and/or persons authorized to consent for the patient, the purpose of this procedure and the potential benefits and risks involved with this procedure.  The benefits include less needle sticks, lab draws from the catheter, and the patient may be discharged home with the catheter. Risks include, but not limited to, infection, bleeding, blood clot (thrombus formation), and puncture of an artery; nerve damage and irregular heartbeat and possibility to perform a PICC exchange if needed/ordered by physician.  Alternatives to this procedure were also discussed.  Bard Power PICC patient education guide, fact sheet on infection prevention and patient information card has been provided to patient /or left at bedside.    PICC/Midline Placement Documentation  PICC Single Lumen [id number] PICC Right Basilic 42 cm 0 cm (Active)  Indication for Insertion or Continuance of Line Poor Vasculature-patient has had multiple peripheral attempts or PIVs lasting less than 24 hours 08/15/2016  9:50 PM  Exposed Catheter (cm) 0 cm 08/15/2016  9:50 PM  Site Assessment Clean;Dry;Intact 08/15/2016  9:50 PM  Line Status Blood return noted;Flushed;Saline locked 08/15/2016  9:50 PM  Dressing Type Transparent 08/15/2016  9:50 PM  Dressing Status Clean;Dry;Intact;Antimicrobial disc in place 08/15/2016  9:50 PM  Dressing Intervention New dressing 08/15/2016  9:50 PM  Dressing Change Due 08/22/16 08/15/2016  9:50 PM       [name] 08/15/2016, 10:08 PM

## 2016-08-15 NOTE — Progress Notes (Signed)
Bilateral nephrostomy dressings and suprapubic changed at 1900 08/15/16

## 2016-08-15 NOTE — Progress Notes (Signed)
No orders placed for direct admit admission. Provider office contacted, awaiting return call.

## 2016-08-15 NOTE — H&P (Signed)
H&P  Chief Complaint: Kidney stone  History of Present Illness: ELLAINE Roach is a 56 y.o. year old female who is admitted in advance of surgery to remove a large left renal stone in 3 days.  The patient has a history of a large left renal stone is low as well as a left distal ureteral stone.  She has a percutaneous nephrostomy tube in place bilaterally.  Additionally, she has a bladder perforation, most likely from chronic inflammation and Foley catheter management.  This has, due to associated urinary tract/pelvic infections, precluded surgery to remove her left renal stone.  The patient does have multiple sclerosis.  She has been scheduled for left percutaneous nephrolithotomy.  At least 3 times in the past 6 months, all of these have been canceled due to the patient having urinary tract infections or a pelvic phlegmon from her bladder leak.  She now has drainage of both kidneys with percutaneous nephrostomy tubes, and her most recent hospitalization was for a urinary tract infection as well as this phlegmon.  She was recently scheduled, 2 weeks ago, for a surgical procedure, this was canceled due to having a yeast UTI, which was adequately treated.  She presents at this time for admission several days before surgery to obtain proper urine cultures, and to be pretreated with IV antibiotics.  Past Medical History:  Diagnosis Date  . Acute renal failure superimposed on stage 3 chronic kidney disease ([place]) 07/21/2015  . Anemia    chronic   . Diabetes mellitus without complication ([place])   . Diabetic neuropathy ([place])   . DVT (deep venous thrombosis) ([place])   . DVT (deep venous thrombosis) ([place])   . GERD (gastroesophageal reflux disease)   . Hx of sepsis   . Hypertension   . Left nephrolithiasis   . Leukocytosis   . Lymph edema    chronic   . Malnutrition of moderate degree 07/21/2015  . Multiple sclerosis diagnosed 2002-not on Therapy any longer 06/26/2012  . Muscle weakness (generalized)   .  Neuromuscular disorder ([place])   . Neuromuscular dysfunction of bladder   . Polyneuropathy ([place])   . Presence of indwelling urinary catheter   . Protein calorie malnutrition ([place])   . Stage 4 decubitus ulcer ([place]) 07/05/2015  . Stroke ([place])   . UTI (lower urinary tract infection)     Past Surgical History:  Procedure Laterality Date  . COLONOSCOPY WITH PROPOFOL N/A 07/25/2016   Procedure: COLONOSCOPY WITH PROPOFOL;  Surgeon: Laurence Spates, MD;  [facility];  Service: Endoscopy;  Laterality: N/A;  . ESOPHAGOGASTRODUODENOSCOPY Left 01/02/2015   Procedure: ESOPHAGOGASTRODUODENOSCOPY (EGD);  Surgeon: Arta Silence, MD;  Location: [facility];  Service: Endoscopy;  Laterality: Left;  . ESOPHAGOGASTRODUODENOSCOPY (EGD) WITH PROPOFOL N/A 07/25/2016   Procedure: ESOPHAGOGASTRODUODENOSCOPY (EGD) WITH PROPOFOL;  Surgeon: Laurence Spates, MD;  [facility];  Service: Endoscopy;  Laterality: N/A;  . HERNIA MESH REMOVAL    . IR GENERIC HISTORICAL  02/23/2016   IR NEPHROSTOMY EXCHANGE LEFT 02/23/2016 Sandi Mariscal, MD [facility]  . IR GENERIC HISTORICAL  04/19/2016   IR NEPHROSTOMY EXCHANGE LEFT 04/19/2016 Arne Cleveland, MD [facility]  . IR GENERIC HISTORICAL  05/13/2016   IR NEPHROSTOMY EXCHANGE LEFT 05/13/2016 Corrie Mckusick, DO [facility]  . IR GENERIC HISTORICAL  05/13/2016   IR NEPHROSTOMY PLACEMENT RIGHT 05/13/2016 Corrie Mckusick, DO [facility]  . IR GENERIC HISTORICAL  07/07/2016   IR NEPHROSTOMY EXCHANGE RIGHT 07/07/2016 Jacqulynn Cadet, MD [facility]  . IR GENERIC  HISTORICAL  07/07/2016   IR NEPHROSTOMY EXCHANGE LEFT 07/07/2016 Jacqulynn Cadet, MD [facility]  . NEPHROSTOMY TUBE PLACEMENT ([place] HX)    . TRANSURETHRAL RESECTION OF BLADDER TUMOR N/A 03/16/2016   Procedure: CYSTOSCOPY BLADDER BIOPSY;  Surgeon: Franchot Gallo, MD;  [facility];  Service: Urology;  Laterality: N/A;  . TUBAL LIGATION    . tubes tided      Home Medications:   (Not in a hospital  admission)  Allergies: No Known Allergies  Family History  Problem Relation Age of Onset  . Diabetes Mother   . Alzheimer's disease Mother   . Diabetes Father   . Diabetes Sister   . Scoliosis Sister     Social History:  reports that she quit smoking about 20 months ago. Her smoking use included Cigarettes. She has a 20.00 pack-year smoking history. She has never used smokeless tobacco. She reports that she drinks about 3.0 oz of alcohol per week . She reports that she does not use drugs.  ROS: The patient currently denies abdominal or flank pain.  She is bed/wheelchair bound.  She is had no recent fevers.  Physical Exam:  Vital signs in last 24 hours: @VSRANGES @ General:  Alert and oriented, No acute distress HEENT: Normocephalic, atraumatic Neck: No JVD or lymphadenopathy Cardiovascular: Regular rate and rhythm Lungs: Normal inspiratory and expiratory excursion Abdomen: Soft, nontender, nondistended, no abdominal masses   Laboratory Data:  No results found for this or any previous visit (from the past 24 hour(s)). No results found for this or any previous visit (from the past 240 hour(s)). Creatinine: No results for input(s): CREATININE in the last 168 hours.  Radiologic Imaging: No results found.  Impression/Assessment:  1.  Left renal and distal ureteral calculi, with nephrostomy tube in place.  Surgical management pending.  2.  Bladder perforation, most likely secondary to tonic inflammation and catheter drainage.  The patient now has a suprapubic tube in  3.  Multiple urinary tract infections, recently treated  Plan:  1.  Admit for preoperative IV antibiotics.  We will first obtain urine cultures from her suprapubic tube as well as bilateral nephrostomy tubes.  2.  She will also be covered with Diflucan for yeast, which was recently grown and treated  Jorja Loa 08/15/2016, 10:20 AM  Lillette Boxer. Tanette Chauca MD

## 2016-08-16 LAB — CREATININE, SERUM
Creatinine, Ser: 0.65 mg/dL (ref 0.44–1.00)
GFR calc Af Amer: >60 mL/min (ref >60)

## 2016-08-16 MED ORDER — GENTAMICIN SULFATE 40 MG/ML IJ SOLN
300.0000 mg | INTRAVENOUS | Status: DC
  Administered 2016-08-16: 300 mg via INTRAVENOUS
  Filled 2016-08-16: qty 7.5

## 2016-08-16 NOTE — NC FL2 (Signed)
[place] LEVEL OF CARE SCREENING TOOL     IDENTIFICATION  Patient Name: [name] Birthdate: 08-15-1960 Sex: female Admission Date (Current Location): 08/15/2016  [place] and [place] Number:  Kathleen Argue [id number] [place] and Address:  [facility],  [place], [place]      Provider Number: [id number]  Attending Physician Name and Address:  Franchot Gallo, MD  Relative Name and Phone Number:       Current Level of Care: Hospital Recommended Level of Care: [place] Prior Approval Number:    Date Approved/Denied:   PASRR Number: [id number]  Discharge Plan: SNF    Current Diagnoses: Patient Active Problem List   Diagnosis Date Noted  . Renal calculus 08/15/2016  . Kidney stone 08/15/2016  . Acute metabolic encephalopathy [id number]  . Essential hypertension 07/19/2016  . Hydronephrosis   . Pelvic abscess in female 03/17/2016  . Septic shock ([place]) 03/08/2016  . Neuropathic pain 01/11/2016  . Left nephrolithiasis 12/22/2015  . Pressure ulcer   . Controlled diabetes mellitus type 2 with complications ([place])   . AKI (acute kidney injury) ([place]) 11/11/2015  . Neurogenic bladder 11/11/2015  . Physical deconditioning 11/11/2015  . Chronic indwelling Foley catheter 09/30/2015  . Dyslipidemia associated with type 2 diabetes mellitus ([place]) 08/28/2015  . GERD without esophagitis 08/28/2015  . Anemia 07/21/2015  . Malnutrition of moderate degree 07/21/2015  . DVT, lower extremity ([place]) 12/28/2014  . Multiple sclerosis diagnosed 2002-not on Therapy any longer 06/26/2012    Orientation RESPIRATION BLADDER Height & Weight     Self, Time, Situation, Place  Normal Indwelling catheter Weight: 136 lb 14.5 oz (62.1 kg) Height:  5\' 4"  (162.6 cm)  BEHAVIORAL SYMPTOMS/MOOD NEUROLOGICAL BOWEL NUTRITION STATUS      Incontinent Diet (Regular)  AMBULATORY STATUS COMMUNICATION OF NEEDS Skin   Extensive Assist Verbally  Other (Comment) (Ulcer right foot - 2)                       Personal Care Assistance Level of Assistance  Bathing, Dressing, Feeding Bathing Assistance: Maximum assistance Feeding assistance: Independent Dressing Assistance: Maximum assistance     Functional Limitations Info  Sight, Speech, Hearing Sight Info: Adequate Hearing Info: Adequate Speech Info: Adequate    SPECIAL CARE FACTORS FREQUENCY                       Contractures Contractures Info: Not present    Additional Factors Info  Code Status, Allergies Code Status Info: Full Code Allergies Info: NKA           Current Medications (08/16/2016):  This is the current hospital active medication list Current Facility-Administered Medications  Medication Dose Route Frequency Provider Last Rate Last Dose  . acetaminophen (TYLENOL) tablet 650 mg  650 mg Oral Q4H PRN Franchot Gallo, MD      . albuterol (PROVENTIL) (2.5 MG/3ML) 0.083% nebulizer solution 2.5 mg  2.5 mg Nebulization Q6H PRN Franchot Gallo, MD      . atorvastatin (LIPITOR) tablet 10 mg  10 mg Oral QHS Franchot Gallo, MD   10 mg at 08/15/16 2258  . enoxaparin (LOVENOX) injection 40 mg  40 mg Subcutaneous Q24H Franchot Gallo, MD   40 mg at 08/15/16 2258  . famotidine (PEPCID) tablet 20 mg  20 mg Oral BID Franchot Gallo, MD   20 mg at 08/15/16 2258  . fluconazole (DIFLUCAN) tablet 100 mg  100 mg Oral Daily  Franchot Gallo, MD   100 mg at 08/15/16 1600  . gabapentin (NEURONTIN) capsule 100 mg  100 mg Oral QHS Franchot Gallo, MD   100 mg at 08/15/16 2258  . gentamicin (GARAMYCIN) 300 mg in dextrose 5 % 100 mL IVPB  300 mg Intravenous Q24H Dorrene German, RPH      . lisinopril (PRINIVIL,ZESTRIL) tablet 2.5 mg  2.5 mg Oral Daily Franchot Gallo, MD      . metFORMIN (GLUCOPHAGE) tablet 250 mg  250 mg Oral BID WC Franchot Gallo, MD      . multivitamin with minerals tablet 1 tablet  1 tablet Oral Daily Franchot Gallo, MD      .  nystatin cream (MYCOSTATIN) 1 application  1 application Topical BID Franchot Gallo, MD   1 application at [id number]  . polyethylene glycol (MIRALAX / GLYCOLAX) packet 17 g  17 g Oral BID Franchot Gallo, MD      . potassium chloride (K-DUR,KLOR-CON) CR tablet 10 mEq  10 mEq Oral Daily Franchot Gallo, MD      . [facility]) tablet 8.6 mg  1 tablet Oral BID Franchot Gallo, MD      . sodium chloride flush (NS) 0.9 % injection 10-40 mL  10-40 mL Intracatheter PRN Franchot Gallo, MD   10 mL at 08/16/16 0334   Facility-Administered Medications Ordered in Other Encounters  Medication Dose Route Frequency Provider Last Rate Last Dose  . 0.9 %  sodium chloride infusion  250 mL Intravenous PRN Franchot Gallo, MD      . enoxaparin (LOVENOX) injection 40 mg  40 mg Subcutaneous Q24H Franchot Gallo, MD      . sodium chloride flush (NS) 0.9 % injection 3 mL  3 mL Intravenous Q12H Franchot Gallo, MD      . sodium chloride flush (NS) 0.9 % injection 3 mL  3 mL Intravenous PRN Franchot Gallo, MD         Discharge Medications: Please see discharge summary for a list of discharge medications.  Relevant Imaging Results:  Relevant Lab Results:   Additional Information SS [id number]  Burnis Medin, LCSW

## 2016-08-16 NOTE — Progress Notes (Signed)
UROLOGY PROGRESS NOTES  Assessment/Plan: [name] is a 56 y.o. female with a history of CKD3, chronic anemia, Dm, DVT, GERD, HTN, nephrolithiasis, Ms, CVA, recurrent UTIs who was preadmitted 08/15/16 prior to planned surgery with Dr. Diona Fanti on 08/18/2016 for left PCNL. She currently has bilateral PCNs in place and has a known bladder perforation likely secondary to chronic inflammation & Foley catheterization. She has had multiple procedures cancelled in the past 6 months for left PCNL secondary to UTI or a pelvic phlegmon from her bladder perforation. She most recently was treated for a yeast UTI.   Interval/Plan: Afebrile. Normal Hr. BP 107/56. 375 UOP (75 L PCN; 300 R PCN; unmeasured void x 1) and 600cc SPT output. Cr 0.65. WBC yesterday 11.5. Hb 9.3. PICC line placed yesterday secondary to poor IV access.  - Urine cultures from suprapubic catheter and bilateral PCNs sent yesterday evening, results pending - Continue diflucan 100mg  po qd and 300mg  IV gent q24hrs   Subjective: Having some right arm discomfort s/p PICC placement. No fevers, n/v, back or abdominal pain. Sleepy this morning.  Objective:  Vital signs in last 24 hours: Temp:  [97.9 F (36.6 C)-98.4 F (36.9 C)] 98.4 F (36.9 C) (03/06 0530) Pulse Rate:  [73-82] 73 (03/06 0530) Resp:  [18] 18 (03/06 0530) BP: (107-121)/(55-75) 107/56 (03/06 0530) SpO2:  [100 %] 100 % (03/06 0530) Weight:  [62.1 kg (136 lb 14.5 oz)] 62.1 kg (136 lb 14.5 oz) (03/05 2018)  03/05 0701 - 03/06 0700 In: 497 [P.O.:297; IV Piggyback:100] Out: 975 [Urine:375; Drains:600]    Physical Exam:  General:  well-developed and well-nourished female in NAD, lying in bed, alert & oriented, pleasant HEENT: [place]/AT, EOMI, sclera anicteric, hearing grossly intact, no nasal discharge, MMM, poor dentition Respiratory: nonlabored respirations, satting well on RA, symmetrical chest rise Cardiovascular: pulse regular rate & rhythm Abdominal: soft, NTTP,  nondistended, SPT draining yellow urine with surrounding dressing GU: bilateral PCNs draining yellow urine with mild mucoid sediment Extremities: warm, well-perfused, some extremity muscle wasting ntoed    Data Review: Results for orders placed or performed during the hospital encounter of 08/15/16 (from the past 24 hour(s))  Glucose, capillary     Status: Abnormal   Collection Time: 08/15/16  5:45 PM  Result Value Ref Range   Glucose-Capillary 141 (H) 65 - 99 mg/dL  MRSA PCR Screening     Status: None   Collection Time: 08/15/16  7:09 PM  Result Value Ref Range   MRSA by PCR NEGATIVE NEGATIVE  Comprehensive metabolic panel     Status: Abnormal   Collection Time: 08/15/16 11:02 PM  Result Value Ref Range   Sodium 137 135 - 145 mmol/L   Potassium 4.1 3.5 - 5.1 mmol/L   Chloride 107 101 - 111 mmol/L   CO2 25 22 - 32 mmol/L   Glucose, Bld 105 (H) 65 - 99 mg/dL   BUN 23 (H) 6 - 20 mg/dL   Creatinine, Ser 0.74 0.44 - 1.00 mg/dL   Calcium 10.6 (H) 8.9 - 10.3 mg/dL   Total Protein 8.1 6.5 - 8.1 g/dL   Albumin 3.4 (L) 3.5 - 5.0 g/dL   AST 19 15 - 41 U/L   ALT 13 (L) 14 - 54 U/L   Alkaline Phosphatase 61 38 - 126 U/L   Total Bilirubin 0.4 0.3 - 1.2 mg/dL   GFR calc non Af Amer >60 >60 mL/min   GFR calc Af Amer >60 >60 mL/min   Anion gap 5  5 - 15  CBC WITH DIFFERENTIAL     Status: Abnormal   Collection Time: 08/15/16 11:02 PM  Result Value Ref Range   WBC 11.5 (H) 4.0 - 10.5 K/uL   RBC 3.32 (L) 3.87 - 5.11 MIL/uL   Hemoglobin 9.3 (L) 12.0 - 15.0 g/dL   HCT 29.5 (L) 36.0 - 46.0 %   MCV 88.9 78.0 - 100.0 fL   MCH 28.0 26.0 - 34.0 pg   MCHC 31.5 30.0 - 36.0 g/dL   RDW 17.4 (H) 11.5 - 15.5 %   Platelets 355 150 - 400 K/uL   Neutrophils Relative % 49 %   Neutro Abs 5.6 1.7 - 7.7 K/uL   Lymphocytes Relative 36 %   Lymphs Abs 4.2 (H) 0.7 - 4.0 K/uL   Monocytes Relative 8 %   Monocytes Absolute 0.9 0.1 - 1.0 K/uL   Eosinophils Relative 6 %   Eosinophils Absolute 0.7 0.0 - 0.7 K/uL    Basophils Relative 1 %   Basophils Absolute 0.1 0.0 - 0.1 K/uL  Creatinine, serum     Status: None   Collection Time: 08/16/16  4:20 AM  Result Value Ref Range   Creatinine, Ser 0.65 0.44 - 1.00 mg/dL   GFR calc non Af Amer >60 >60 mL/min   GFR calc Af Amer >60 >60 mL/min    Imaging: None

## 2016-08-17 LAB — CREATININE, SERUM
CREATININE: 0.69 mg/dL (ref 0.44–1.00)
GFR calc Af Amer: >60 mL/min (ref >60)
GFR calc non Af Amer: >60 mL/min (ref >60)

## 2016-08-17 LAB — GENTAMICIN LEVEL, RANDOM: GENTAMICIN RM: 6.4 ug/mL

## 2016-08-17 LAB — TYPE AND SCREEN
ABO/RH(D): A POS
Antibody Screen: NEGATIVE

## 2016-08-17 LAB — HIV ANTIBODY (ROUTINE TESTING W REFLEX): HIV SCREEN 4TH GENERATION: NONREACTIVE

## 2016-08-17 MED ORDER — PIPERACILLIN-TAZOBACTAM 3.375 G IVPB 30 MIN
3.3750 g | Freq: Three times a day (TID) | INTRAVENOUS | Status: AC
  Administered 2016-08-17 – 2016-08-18 (×2): 3.375 g via INTRAVENOUS
  Filled 2016-08-17 (×3): qty 50

## 2016-08-17 MED ORDER — GENTAMICIN SULFATE 40 MG/ML IJ SOLN
300.0000 mg | INTRAVENOUS | Status: DC
  Administered 2016-08-18: 300 mg via INTRAVENOUS
  Filled 2016-08-17: qty 7.5

## 2016-08-17 NOTE — Progress Notes (Signed)
Pharmacy Antibiotic Note  [name] is a 56 y.o. female admitted on 08/15/2016 with UTI.  Pharmacy has been consulted for gentamicin dosing. She is admitted for IV antibiotics for upcoming ureteral stone removal. She has had several UTIs that have resulted in her surgery being delayed.  She has BL nephrostomy tubes.  Her most recent Urine cx from 2/16 grew >100k yeast.    Today, 08/17/2016  Day #2 antibiotics  SCr remains stable  WBC slightly elevated  Cultures to be obtained from suprapubic and nephrostomy tubes   Suprapubic catheter growing proteus mirabilis  For OR 3/8  Random gentamicin level (10.5h level) following 2nd dose = 6.4 mcg/mL  Plan:  Based on 5mg /kg nomogram, change Gentamicin 300mg  (5mg /kg) IV q36h (from q24h)  Consider change to ceftriaxone to cover proteus (98% susceptibility per our antibiogram)  Monitor renal function - check daily SCr  Temp (24hrs), Avg:98.8 F (37.1 C), Min:98.4 F (36.9 C), Max:99.2 F (37.3 C)   Recent Labs Lab 08/15/16 2302 08/16/16 0420 08/17/16 0431 08/17/16 1007  WBC 11.5*  --   --   --   CREATININE 0.74 0.65 0.69  --   GENTRANDOM  --   --   --  6.4    Estimated Creatinine Clearance: 67 mL/min (by C-G formula based on SCr of 0.69 mg/dL).    No Known Allergies  Antimicrobials this admission: 3/5 gentamicin >>  Dose adjustments this admission:  Microbiology results: 2/15 Ucx: yeast (obtained from suprapubic, and both nephrostomy tubes) 3/5 UCx (suprapubic): P. mirabilis 3/6 UCx (L nephrostomy):GNR 3/6 UCx (R nephrostomy): GNR 3/6 UCx (suprapubic#2):   Thank you for allowing pharmacy to be a part of this patient's care.  Doreene Eland, PharmD, BCPS.   Pager: [phone] 08/17/2016 11:55 AM

## 2016-08-17 NOTE — H&P (Signed)
Pt unable to sign consent form for left percutaneous Nephrolithotmy due to poor grip from Multiple Sclerosis. Pt gave verbal consent to Probation officer and witnessed by Salome Spotted, RN.

## 2016-08-17 NOTE — Progress Notes (Signed)
  Subjective: Patient has no c/os.   Objective: Vital signs in last 24 hours: Temp:  [98.4 F (36.9 C)-99.2 F (37.3 C)] 99.2 F (37.3 C) (03/07 0524) Pulse Rate:  [72-92] 72 (03/07 0524) Resp:  [18] 18 (03/07 0524) BP: (93-108)/(50-69) 93/50 (03/07 0524) SpO2:  [97 %-100 %] 97 % (03/07 0524)  Intake/Output from previous day: 03/06 0701 - 03/07 0700 In: 347.5 [P.O.:240; IV Piggyback:107.5] Out: 1235 [Urine:910; Drains:325] Intake/Output this shift: Total I/O In: 107.5 [IV Piggyback:107.5] Out: 685 [Urine:360; Drains:325]  Physical Exam:  Constitutional: Vital signs reviewed. WD WN in NAD   Eyes: PERRL, No scleral icterus.   Cardiovascular: Regular rate Pulmonary/Chest: Normal effort Abdominal: Soft. Non-tender, non-distended, bowel sounds are normal, no masses, organomegaly, or guarding present. PCN tubes in place bilaterally.  Lab Results:  Recent Labs  08/15/16 2302  HGB 9.3*  HCT 29.5*   BMET  Recent Labs  08/15/16 2302 08/16/16 0420 08/17/16 0431  NA 137  --   --   K 4.1  --   --   CL 107  --   --   CO2 25  --   --   GLUCOSE 105*  --   --   BUN 23*  --   --   CREATININE 0.74 0.65 0.69  CALCIUM 10.6*  --   --    No results for input(s): LABPT, INR in the last 72 hours. No results for input(s): LABURIN in the last 72 hours. Results for orders placed or performed during the hospital encounter of 08/15/16  MRSA PCR Screening     Status: None   Collection Time: 08/15/16  7:09 PM  Result Value Ref Range Status   MRSA by PCR NEGATIVE NEGATIVE Final    Comment:        The GeneXpert MRSA Assay (FDA approved for NASAL specimens only), is one component of a comprehensive MRSA colonization surveillance program. It is not intended to diagnose MRSA infection nor to guide or monitor treatment for MRSA infections.     Studies/Results: No results found.  Assessment/Plan:   Left renal stone. PCN tube in place. Urine cultures pending, on  gent/diflucan  For surgery tomorrow.  T&S sent   LOS: 2 days   Franchot Gallo M 08/17/2016, 6:54 AM

## 2016-08-18 ENCOUNTER — Inpatient Hospital Stay (HOSPITAL_COMMUNITY): Payer: Medicare Other | Admitting: Anesthesiology

## 2016-08-18 ENCOUNTER — Encounter (HOSPITAL_COMMUNITY)
Admission: RE | Disposition: A | Discharge status: Skilled Nursing Facility | Payer: Self-pay | Source: Ambulatory Visit | Attending: Urology

## 2016-08-18 ENCOUNTER — Inpatient Hospital Stay (HOSPITAL_COMMUNITY): Payer: Medicare Other

## 2016-08-18 HISTORY — PX: HOLMIUM LASER APPLICATION: SHX5852

## 2016-08-18 HISTORY — PX: NEPHROLITHOTOMY: SHX5134

## 2016-08-18 LAB — GLUCOSE, CAPILLARY
Glucose-Capillary: 88 mg/dL (ref 65–99)
Glucose-Capillary: 98 mg/dL (ref 65–99)

## 2016-08-18 LAB — CREATININE, SERUM
CREATININE: 0.84 mg/dL (ref 0.44–1.00)
GFR calc Af Amer: >60 mL/min (ref >60)
GFR calc non Af Amer: >60 mL/min (ref >60)

## 2016-08-18 SURGERY — NEPHROLITHOTOMY PERCUTANEOUS
Anesthesia: General | Laterality: Left

## 2016-08-18 MED ORDER — SODIUM CHLORIDE 0.45 % IV SOLN
INTRAVENOUS | Status: DC
  Administered 2016-08-18: 06:00:00 via INTRAVENOUS

## 2016-08-18 MED ORDER — ROCURONIUM BROMIDE 100 MG/10ML IV SOLN
INTRAVENOUS | Status: DC | PRN
Start: 2016-08-18 — End: 2016-08-18
  Administered 2016-08-18: 20 mg via INTRAVENOUS

## 2016-08-18 MED ORDER — HYDROMORPHONE HCL 2 MG/ML IJ SOLN
INTRAMUSCULAR | Status: AC
  Filled 2016-08-18: qty 1

## 2016-08-18 MED ORDER — FENTANYL CITRATE (PF) 100 MCG/2ML IJ SOLN
INTRAMUSCULAR | Status: AC
  Filled 2016-08-18: qty 2

## 2016-08-18 MED ORDER — PIPERACILLIN-TAZOBACTAM 3.375 G IVPB
3.3750 g | Freq: Three times a day (TID) | INTRAVENOUS | Status: DC
  Administered 2016-08-18 – 2016-08-19 (×2): 3.375 g via INTRAVENOUS
  Filled 2016-08-18 (×5): qty 50

## 2016-08-18 MED ORDER — ROCURONIUM BROMIDE 50 MG/5ML IV SOSY
PREFILLED_SYRINGE | INTRAVENOUS | Status: AC
  Filled 2016-08-18: qty 5

## 2016-08-18 MED ORDER — PROPOFOL 10 MG/ML IV BOLUS
INTRAVENOUS | Status: AC
  Filled 2016-08-18: qty 20

## 2016-08-18 MED ORDER — LIDOCAINE HCL (CARDIAC) 20 MG/ML IV SOLN
INTRAVENOUS | Status: DC | PRN
  Administered 2016-08-18: 40 mg via INTRAVENOUS

## 2016-08-18 MED ORDER — HYDROMORPHONE HCL 1 MG/ML IJ SOLN
0.2500 mg | INTRAMUSCULAR | Status: DC | PRN
  Administered 2016-08-18 (×4): 0.5 mg via INTRAVENOUS

## 2016-08-18 MED ORDER — FENTANYL CITRATE (PF) 100 MCG/2ML IJ SOLN
INTRAMUSCULAR | Status: DC | PRN
  Administered 2016-08-18 (×3): 50 ug via INTRAVENOUS

## 2016-08-18 MED ORDER — IOHEXOL 300 MG/ML  SOLN
INTRAMUSCULAR | Status: DC | PRN
  Administered 2016-08-18: 15 mL

## 2016-08-18 MED ORDER — LIDOCAINE 2% (20 MG/ML) 5 ML SYRINGE
INTRAMUSCULAR | Status: AC
  Filled 2016-08-18: qty 5

## 2016-08-18 MED ORDER — PROPOFOL 10 MG/ML IV BOLUS
INTRAVENOUS | Status: DC | PRN
  Administered 2016-08-18: 100 mg via INTRAVENOUS

## 2016-08-18 MED ORDER — LACTATED RINGERS IV SOLN
INTRAVENOUS | Status: DC
  Administered 2016-08-18: 12:00:00 via INTRAVENOUS

## 2016-08-18 MED ORDER — HYDROCODONE-ACETAMINOPHEN 5-325 MG PO TABS
1.0000 | ORAL_TABLET | Freq: Four times a day (QID) | ORAL | Status: DC | PRN
Start: 2016-08-18 — End: 2016-08-19
  Administered 2016-08-18 – 2016-08-19 (×2): 1 via ORAL
  Filled 2016-08-18 (×2): qty 1

## 2016-08-18 MED ORDER — ONDANSETRON HCL 4 MG/2ML IJ SOLN
INTRAMUSCULAR | Status: DC | PRN
  Administered 2016-08-18: 4 mg via INTRAVENOUS

## 2016-08-18 MED ORDER — MIDAZOLAM HCL 2 MG/2ML IJ SOLN
INTRAMUSCULAR | Status: AC
  Filled 2016-08-18: qty 2

## 2016-08-18 MED ORDER — SODIUM CHLORIDE 0.9 % IR SOLN
Status: DC | PRN
  Administered 2016-08-18: 12000 mL

## 2016-08-18 MED ORDER — LACTATED RINGERS IV SOLN
INTRAVENOUS | Status: DC | PRN
  Administered 2016-08-18: 13:00:00 via INTRAVENOUS

## 2016-08-18 MED ORDER — CEFAZOLIN SODIUM-DEXTROSE 2-4 GM/100ML-% IV SOLN
2.0000 g | INTRAVENOUS | Status: AC
  Administered 2016-08-18: 2 g via INTRAVENOUS
  Filled 2016-08-18: qty 100

## 2016-08-18 MED ORDER — CEFAZOLIN SODIUM-DEXTROSE 2-4 GM/100ML-% IV SOLN
INTRAVENOUS | Status: AC
  Filled 2016-08-18: qty 100

## 2016-08-18 MED ORDER — MIDAZOLAM HCL 5 MG/5ML IJ SOLN
INTRAMUSCULAR | Status: DC | PRN
Start: 2016-08-18 — End: 2016-08-18
  Administered 2016-08-18: 1 mg via INTRAVENOUS

## 2016-08-18 MED ORDER — SUGAMMADEX SODIUM 200 MG/2ML IV SOLN
INTRAVENOUS | Status: DC | PRN
  Administered 2016-08-18: 150 mg via INTRAVENOUS

## 2016-08-18 MED ORDER — ONDANSETRON HCL 4 MG/2ML IJ SOLN
INTRAMUSCULAR | Status: AC
  Filled 2016-08-18: qty 2

## 2016-08-18 SURGICAL SUPPLY — 49 items
BAG URINE DRAINAGE (UROLOGICAL SUPPLIES) ×2 IMPLANT
BASKET LASER NITINOL 1.9FR (BASKET) ×2 IMPLANT
BASKET ZERO TIP NITINOL 2.4FR (BASKET) IMPLANT
BENZOIN TINCTURE PRP APPL 2/3 (GAUZE/BANDAGES/DRESSINGS) ×4 IMPLANT
BLADE SURG 15 STRL LF DISP TIS (BLADE) ×1 IMPLANT
BLADE SURG 15 STRL SS (BLADE) ×1
CARTRIDGE STONEBREAK CO2 KIDNE (ELECTROSURGICAL) IMPLANT
CATH FOLEY 2W COUNCIL 20FR 5CC (CATHETERS) IMPLANT
CATH MULTI PURPOSE 16FR DRAIN (STENTS) ×2 IMPLANT
CATH ROBINSON RED A/P 20FR (CATHETERS) IMPLANT
CATH URET DUAL LUMEN 6-10FR 50 (CATHETERS) ×2 IMPLANT
CATH UROLOGY TORQUE 40 (MISCELLANEOUS) ×2 IMPLANT
CATH X-FORCE N30 NEPHROSTOMY (TUBING) ×2 IMPLANT
COVER SURGICAL LIGHT HANDLE (MISCELLANEOUS) ×2 IMPLANT
DRAPE C-ARM 42X120 X-RAY (DRAPES) ×2 IMPLANT
DRAPE LINGEMAN PERC (DRAPES) ×2 IMPLANT
DRAPE SURG IRRIG POUCH 19X23 (DRAPES) ×2 IMPLANT
DRSG PAD ABDOMINAL 8X10 ST (GAUZE/BANDAGES/DRESSINGS) ×4 IMPLANT
DRSG TEGADERM 8X12 (GAUZE/BANDAGES/DRESSINGS) ×2 IMPLANT
FIBER LASER FLEXIVA 1000 (UROLOGICAL SUPPLIES) IMPLANT
FIBER LASER FLEXIVA 365 (UROLOGICAL SUPPLIES) IMPLANT
FIBER LASER FLEXIVA 550 (UROLOGICAL SUPPLIES) IMPLANT
FIBER LASER TRAC TIP (UROLOGICAL SUPPLIES) ×2 IMPLANT
GAUZE SPONGE 4X4 12PLY STRL (GAUZE/BANDAGES/DRESSINGS) ×2 IMPLANT
GLOVE BIOGEL M 8.0 STRL (GLOVE) ×2 IMPLANT
GOWN STRL REUS W/TWL XL LVL3 (GOWN DISPOSABLE) ×2 IMPLANT
GUIDEWIRE AMPLAZ .035X145 (WIRE) ×4 IMPLANT
GUIDEWIRE ANG ZIPWIRE 038X150 (WIRE) ×2 IMPLANT
KIT BASIN OR (CUSTOM PROCEDURE TRAY) ×2 IMPLANT
MANIFOLD NEPTUNE II (INSTRUMENTS) ×2 IMPLANT
NS IRRIG 1000ML POUR BTL (IV SOLUTION) IMPLANT
PACK CYSTO (CUSTOM PROCEDURE TRAY) ×2 IMPLANT
PAD ABD 8X10 STRL (GAUZE/BANDAGES/DRESSINGS) ×2 IMPLANT
PROBE KIDNEY STONEBRKR 2.0X425 (ELECTROSURGICAL) IMPLANT
PROBE LITHOCLAST ULTRA 3.8X403 (UROLOGICAL SUPPLIES) IMPLANT
PROBE PNEUMATIC 1.0MMX570MM (UROLOGICAL SUPPLIES) IMPLANT
SET IRRIG Y TYPE TUR BLADDER L (SET/KITS/TRAYS/PACK) ×2 IMPLANT
SHEATH PEELAWAY SET 9 (SHEATH) ×2 IMPLANT
SPONGE LAP 4X18 X RAY DECT (DISPOSABLE) ×2 IMPLANT
STONE CATCHER W/TUBE ADAPTER (UROLOGICAL SUPPLIES) IMPLANT
SUT SILK 2 0 30  PSL (SUTURE) ×1
SUT SILK 2 0 30 PSL (SUTURE) ×1 IMPLANT
SYR 10ML LL (SYRINGE) ×2 IMPLANT
SYR 20CC LL (SYRINGE) ×4 IMPLANT
TRAY FOLEY BAG SILVER LF 14FR (CATHETERS) IMPLANT
TRAY FOLEY W/METER SILVER 16FR (SET/KITS/TRAYS/PACK) IMPLANT
TUBE CONNECTING VINYL 14FR 30C (MISCELLANEOUS) ×2 IMPLANT
TUBING CONNECTING 10 (TUBING) ×6 IMPLANT
WATER STERILE IRR 1500ML POUR (IV SOLUTION) IMPLANT

## 2016-08-18 NOTE — Progress Notes (Signed)
I.V. Intake in PACU-100 CC

## 2016-08-18 NOTE — Anesthesia Preprocedure Evaluation (Addendum)
Anesthesia Evaluation  Patient identified by MRN, date of birth, ID band Patient awake    Reviewed: Allergy & Precautions, NPO status , Patient's Chart, lab work & pertinent test results  Airway Mallampati: II  TM Distance: >3 FB     Dental   Pulmonary former smoker,    breath sounds clear to auscultation       Cardiovascular hypertension, + Peripheral Vascular Disease   Rhythm:Regular Rate:Normal     Neuro/Psych  Neuromuscular disease    GI/Hepatic Neg liver ROS, GERD  ,  Endo/Other  diabetes  Renal/GU Renal disease     Musculoskeletal   Abdominal   Peds  Hematology   Anesthesia Other Findings   Reproductive/Obstetrics                             Anesthesia Physical Anesthesia Plan  ASA: III  Anesthesia Plan: General   Post-op Pain Management:    Induction: Intravenous  Airway Management Planned: LMA  Additional Equipment:   Intra-op Plan:   Post-operative Plan: Extubation in OR  Informed Consent: I have reviewed the patients History and Physical, chart, labs and discussed the procedure including the risks, benefits and alternatives for the proposed anesthesia with the patient or authorized representative who has indicated his/her understanding and acceptance.   Dental advisory given  Plan Discussed with: CRNA and Anesthesiologist  Anesthesia Plan Comments:         Anesthesia Quick Evaluation

## 2016-08-18 NOTE — Transfer of Care (Signed)
Immediate Anesthesia Transfer of Care Note  Patient: [name]  Procedure(s) Performed: Procedure(s): NEPHROLITHOTOMY LEFT PERCUTANEOUS (Left) HOLMIUM LASER APPLICATION (Left)  Patient Location: PACU  Anesthesia Type:General  Level of Consciousness: awake, alert  and oriented  Airway & Oxygen Therapy: Patient Spontanous Breathing and Patient connected to face mask oxygen  Post-op Assessment: Report given to RN and Post -op Vital signs reviewed and stable  Post vital signs: Reviewed and stable  Last Vitals:  Vitals:   08/17/16 2117 08/18/16 0514  BP: 116/67 97/61  Pulse: 85 71  Resp: 18 18  Temp: 36.7 C 36.4 C    Last Pain:  Vitals:   08/18/16 1016  TempSrc:   PainSc: 0-No pain         Complications: No apparent anesthesia complications

## 2016-08-18 NOTE — Progress Notes (Signed)
Patient received back from PACU in stable condition.  Will continue to monitor patient.

## 2016-08-18 NOTE — Progress Notes (Signed)
Subjective: Patient reports no pain  Objective: Vital signs in last 24 hours: Temp:  [97.5 F (36.4 C)-98 F (36.7 C)] 97.5 F (36.4 C) (03/08 0514) Pulse Rate:  [71-85] 71 (03/08 0514) Resp:  [18] 18 (03/08 0514) BP: (97-116)/(61-67) 97/61 (03/08 0514) SpO2:  [96 %-100 %] 96 % (03/08 0514)  Intake/Output from previous day: 03/07 0701 - 03/08 0700 In: 370 [P.O.:360; I.V.:10] Out: 1350 [Urine:650; Drains:700] Intake/Output this shift: No intake/output data recorded.  Physical Exam:  Constitutional: Vital signs reviewed. WD WN in NAD   Eyes: PERRL, No scleral icterus.   Cardiovascular: RRR   Lab Results:  Recent Labs  08/15/16 2302  HGB 9.3*  HCT 29.5*   BMET  Recent Labs  08/15/16 2302  08/17/16 0431 08/18/16 0452  NA 137  --   --   --   K 4.1  --   --   --   CL 107  --   --   --   CO2 25  --   --   --   GLUCOSE 105*  --   --   --   BUN 23*  --   --   --   CREATININE 0.74  < > 0.69 0.84  CALCIUM 10.6*  --   --   --   < > = values in this interval not displayed. No results for input(s): LABPT, INR in the last 72 hours. No results for input(s): LABURIN in the last 72 hours. Results for orders placed or performed during the hospital encounter of 08/15/16  Urine culture     Status: Abnormal (Preliminary result)   Collection Time: 08/15/16  7:09 PM  Result Value Ref Range Status   Specimen Description URINE, SUPRAPUBIC  Final   Special Requests NONE  Final   Culture (A)  Final    >=100,000 COLONIES/mL PROTEUS MIRABILIS SUSCEPTIBILITIES TO FOLLOW Performed at [facility], [address], [place], [place] [address]    Report Status PENDING  Incomplete  MRSA PCR Screening     Status: None   Collection Time: 08/15/16  7:09 PM  Result Value Ref Range Status   MRSA by PCR NEGATIVE NEGATIVE Final    Comment:        The GeneXpert MRSA Assay (FDA approved for NASAL specimens only), is one component of a comprehensive MRSA colonization surveillance  program. It is not intended to diagnose MRSA infection nor to guide or monitor treatment for MRSA infections.   Culture, Urine     Status: Abnormal (Preliminary result)   Collection Time: 08/16/16 12:23 PM  Result Value Ref Range Status   Specimen Description URINE, RANDOM NEPHROLOGY LT  Final   Special Requests NONE  Final   Culture (A)  Final    >=100,000 COLONIES/mL PROTEUS MIRABILIS SUSCEPTIBILITIES TO FOLLOW Performed at [facility], [address] [place], [place] [address]    Report Status PENDING  Incomplete  Culture, Urine     Status: None (Preliminary result)   Collection Time: 08/16/16 12:28 PM  Result Value Ref Range Status   Specimen Description URINE, SUPRAPUBIC  Final   Special Requests NONE  Final   Culture   Final    CULTURE REINCUBATED FOR BETTER GROWTH Performed at [facility], [place] [address], [place], [place] [address]    Report Status PENDING  Incomplete  Culture, Urine     Status: Abnormal (Preliminary result)   Collection Time: 08/16/16 12:32 PM  Result Value  Ref Range Status   Specimen Description URINE, RANDOM NEPHROLOGY RT  Final   Special Requests NONE  Final   Culture (A)  Final    >=100,000 COLONIES/mL ESCHERICHIA COLI SUSCEPTIBILITIES TO FOLLOW Performed at [facility], [address] [place], [place] [address]    Report Status PENDING  Incomplete    Studies/Results: No results found.  Assessment/Plan:   Large left renal/ureteral stones for lt PCN today   LOS: 3 days   Franchot Gallo M 08/18/2016, 7:19 AM

## 2016-08-18 NOTE — Op Note (Signed)
Preoperative diagnosis: Large (each greater than 2.0 centimeters) left renal calculi, left distal ureteral calculus.  Postoperative diagnosis: Same  Principal procedure: Left percutaneous nephrolithotomy, antegrade nephrostogram on left, fluoroscopic interpretation  Surgeon: Elianis Fischbach  Anesthesia: Gen. endotracheal  Complications: None  Specimen: Few Stone fragments, for analysis  Drains: 56 French Cook catheter as left nephrostomy tube.  Estimated blood loss: Less than 50 mL: Indications: 56 year old female with 2 renal calculi, each greater than 2 centimeters in size.  She also has a left distal ureteral stone, approximately 13 millimeters in size.  These were diagnosed in the summer of 2017.  She currently has bilateral nephrostomy tubes as well as a suprapubic tube.  We are trying to divert urine away from her bladder, as there is a bladder perforation good.  We have been managing with this drainage.  The patient has been scheduled 3 previous times for this nephrolithotomy, but these have been canceled due to her bladder perforation with subsequent pelvic phlegmon.  She currently has been admitted 4 days ago for antibiotic management and medical management.  Prior to this procedure.  She is currently aware of the procedure of left percutaneous nephrolithotomy, as well as risks and complications which have been discussed with her several times previously.  She understands these and desires to proceed.  Description of procedure: The patient was properly identified in the holding area.  The she was taken the operating room where general endotracheal anesthetic was administered.  She was placed in the prone position.  All extremities and pressure points were appropriately padded.  Her left flank is prepped and draped around the indwelling left nephrostomy tube.  Timeout was then performed.  I then cut the nephrostomy tube 3 centimeters outside the skin.  I then advanced, with significant  difficulty, a guidewire through this.  It was obvious that there were some encrustations within this nephrostomy tube.  Once, with fluoroscopic guidance, I was able to negotiate a guidewire through the nephrostomy tube, the nephrostomy tube was removed.  I then placed a Kumpe catheter over top of the guidewire, and with minimal difficulty was able to negotiate the guidewire down through the ureter into the bladder where good curl was seen.  The Kumpe catheter was then removed.  I then placed 10 French peel-away access sheath over top of the working guidewire.  A second guidewire was then placed through the ureter and into the bladder using fluoroscopic guidance.  I then passed a NephroMax balloon over top of the working guidewire, and using fluoroscopy, advanced the tip into the renal pelvis.  The skin was incised for approximately 1.5 centimeters prior to passing the NephroMax balloon.  I then also used a hemostat to open up the subcutaneous tissue and the fascia.  The balloon was then inflated to 25 atmospheres.  I then easily passed a nephrostomy access sheath over top of the balloon, into the renal pelvis using fluoroscopic guidance.  At this point, the balloon was deflated and removed.  Both the working and the safety guidewire were left in place.  I then passed the rigid nephroscope through the sheath and into the renal pelvis.  One large stone was seen.  This was pulverized using the ultrasonic lithotrite, with all small dustlike fragments being swept up with the suction.  Following treatment of this stone, the larger stone was seen, with the assistance of the flexible nephroscope, in an adjacent upper pole calyx.  Once identified, the sheath was torqued a little bit lower, so that  access to this upper pole calyx was easily with the rigid nephroscope.  Again, the stone was pulverized using the ultrasonic lithotrite, again with all small fragments being swept up with suction.  Careful inspection of all of  the calyces, revealed no significant dust or fragments remaining.  At this point, the rigid nephroscope was removed.  I then passed the 6 [place] digital flexible, dual channel ureteroscope down into the ureter using direct vision.  This easily passed into the distal ureter with a large stone was seen.  It was easily grasped, but could not be moved more proximally in the ureter.  I then used the 200 micron laser fiber to fragment the large stone into multiple smaller fragments.  These were then swept up using the escape basket.  All distal ureteral stones were removed.  Multiple small fragments rinsed up into the renal pelvis and calyces.  I diligently removed all stone material from the ureter.  Following this, the rigid nephroscope was replaced in the renal pelvis, and the ultrasonic lithotrite was used to vacuum all small stones from the calyces which were carefully inspected.  Once this took place, I again passed the flexible ureteroscope down into the ureter.  No further stone material was seen.  One last look was made within the renal pelvis and calyceal system with the rigid nephroscope.  There being no further stone fragments seen, I removed the scope.  I then passed a [place] pigtail catheter over top of the working guidewire.  Once this was negotiated into the renal pelvis, I removed the access sheath.  I then curled.  The distal tip of the Cook pigtail catheter, with the tip of the catheter, easily being curled in the renal pelvis.  I then used approximately 12 mL of 50-50 solution of contrast/saline to perform a nephrostogram.  This showed no significant extravasation of contrast outside of the calyceal system or the renal pelvis.  However, no antegrade passage of the contrast was seen.  At this point, the flank incision was closed with 2-0 silk placed in a vertical mattress suture.  2 sutures were placed.  A stay suture was placed as well.  Dry sterile dressing was placed around the catheter.   At this point, the patient was then awakened.  She was taken to the PACU in stable condition, having tolerated the procedure well.

## 2016-08-18 NOTE — Anesthesia Procedure Notes (Signed)
Procedure Name: Intubation Date/Time: 08/18/2016 12:29 PM Performed by: Glory Buff Pre-anesthesia Checklist: Patient identified, Emergency Drugs available, Suction available and Patient being monitored Patient Re-evaluated:Patient Re-evaluated prior to inductionOxygen Delivery Method: Circle system utilized Preoxygenation: Pre-oxygenation with 100% oxygen Intubation Type: IV induction Ventilation: Mask ventilation without difficulty Laryngoscope Size: Miller and 3 Grade View: Grade I Tube type: Oral Tube size: 7.0 mm Number of attempts: 1 Airway Equipment and Method: Stylet and Oral airway Placement Confirmation: ETT inserted through vocal cords under direct vision,  positive ETCO2 and breath sounds checked- equal and bilateral Secured at: 20 cm Tube secured with: Tape Dental Injury: Teeth and Oropharynx as per pre-operative assessment

## 2016-08-19 ENCOUNTER — Encounter (HOSPITAL_COMMUNITY): Payer: Self-pay | Admitting: Urology

## 2016-08-19 LAB — URINE CULTURE
Culture: 10000 — AB
Culture: >=100000 — AB
Culture: >=100000 — AB

## 2016-08-19 LAB — CREATININE, SERUM
CREATININE: 0.91 mg/dL (ref 0.44–1.00)
GFR calc Af Amer: >60 mL/min (ref >60)

## 2016-08-19 LAB — HEMOGLOBIN AND HEMATOCRIT, BLOOD
HEMATOCRIT: 27.3 % — AB (ref 36.0–46.0)
Hemoglobin: 8.9 g/dL — ABNORMAL LOW (ref 12.0–15.0)

## 2016-08-19 MED ORDER — SULFAMETHOXAZOLE-TRIMETHOPRIM 400-80 MG PO TABS
1.0000 | ORAL_TABLET | Freq: Two times a day (BID) | ORAL | Status: DC
  Administered 2016-08-19: 1 via ORAL
  Filled 2016-08-19 (×3): qty 1

## 2016-08-19 MED ORDER — SULFAMETHOXAZOLE-TRIMETHOPRIM 800-160 MG PO TABS: 1.0000 | ORAL_TABLET | Freq: Two times a day (BID) | ORAL | 0 refills | Status: DC

## 2016-08-19 MED ORDER — SULFAMETHOXAZOLE-TRIMETHOPRIM 400-80 MG PO TABS: 1.0000 | ORAL_TABLET | Freq: Two times a day (BID) | ORAL | 0 refills | Status: DC

## 2016-08-19 MED ORDER — CEPHALEXIN 500 MG PO CAPS: 500.0000 mg | ORAL_CAPSULE | Freq: Three times a day (TID) | ORAL | 0 refills | Status: DC

## 2016-08-19 NOTE — Addendum Note (Signed)
Addendum  created 08/19/16 1720 by Belinda Block, MD   Anesthesia Attestations filed

## 2016-08-19 NOTE — Anesthesia Postprocedure Evaluation (Signed)
Anesthesia Post Note  Patient: [name]  Procedure(s) Performed: Procedure(s) (LRB): NEPHROLITHOTOMY LEFT PERCUTANEOUS (Left) HOLMIUM LASER APPLICATION (Left)  Anesthesia Type: General       Last Vitals:  Vitals:   08/18/16 2127 08/19/16 0605  BP: 113/70   Pulse: 88 84  Resp: 16 16  Temp: 37.1 C 36.9 C    Last Pain:  Vitals:   08/19/16 2003  TempSrc: Oral  PainSc:                  Lynda Rainwater

## 2016-08-19 NOTE — Progress Notes (Signed)
After being transferred x6 times, placed on hold x5 minutes and disconnected, report was given to [employer], Therapist, sports at [facility]  Patient is in wheelchair and ready for transfer.

## 2016-08-19 NOTE — Discharge Summary (Signed)
Patient ID: [name] MRN: [id number] DOB/AGE: February 07, 1961 56 y.o.  Admit date: 08/15/2016 Discharge date: 08/19/2016  Primary Care Physician:  Blanchie Serve, MD  Discharge Diagnoses:  Left kidney stone  Consults:  None   Discharge Medications:    Significant Diagnostic Studies:  No results found.  Brief H and P: For complete details please refer to admission H and P, but in brief the pt was admitted for initial abx mgmt before left pcnl  Hospital Course:  Active Problems:   Kidney stone She underwent pcnl of 3 stones--2 large left renal stones and 1 large left distal ureteral stones. Postop course was uncomplicated and she left on POD 1  Day of Discharge BP 113/70 (BP Location: Left Arm)   Pulse 84   Temp 98.4 F (36.9 C) (Oral)   Resp 16   Ht 5\' 4"  (1.626 m)   Wt 62.1 kg (136 lb 14.5 oz)   SpO2 100%   BMI 23.50 kg/m   Results for orders placed or performed during the hospital encounter of 08/15/16 (from the past 24 hour(s))  Glucose, capillary     Status: None   Collection Time: 08/18/16 12:12 PM  Result Value Ref Range   Glucose-Capillary 88 65 - 99 mg/dL  Glucose, capillary     Status: None   Collection Time: 08/18/16  3:18 PM  Result Value Ref Range   Glucose-Capillary 98 65 - 99 mg/dL  Hemoglobin and hematocrit, blood     Status: Abnormal   Collection Time: 08/19/16  4:31 AM  Result Value Ref Range   Hemoglobin 8.9 (L) 12.0 - 15.0 g/dL   HCT 27.3 (L) 36.0 - 46.0 %    Physical Exam: General: Alert and awake oriented x3 not in any acute distress. HEENT: anicteric sclera, pupils reactive to light and accommodation CVS: S1-S2 clear no murmur rubs or gallops Chest: clear to auscultation bilaterally, no wheezing rales or rhonchi Abdomen: soft nontender, nondistended, normal bowel sounds, no organomegaly Extremities: no cyanosis, clubbing or edema noted bilaterally Neuro: Cranial nerves II-XII intact, no focal neurological deficits  Disposition:   SNF  Diet:  No restrictions  Activity:  Ad lib   Disposition and Follow-up:    Will followup in office  TESTS THAT NEED FOLLOW-UP  Check finnal urine cultures  DISCHARGE FOLLOW-UP Follow-up Information    Jaiyla Granados, Lillette Boxer, MD Follow up.   Specialty:  Urology Why:  we will call you Contact information: [place] [id number] [phone]           Time spent on Discharge:  15 mins  Signed: Jorja Loa 08/19/2016, 7:33 AM

## 2016-08-19 NOTE — Discharge Instructions (Signed)

## 2016-08-19 NOTE — Progress Notes (Signed)
Chaplain stopped by to visit with patient.  Patient says she is leaving today after just having procedure.  She says her doctor says she gotta go back to the nursing facility.  When asking the patient how she feels, patient says she is alive.  She continues to share her hope of not returning to the hospital for any reasons.  Patient talks about her hair and her family.  She talks of good times and future plans for hair cut and seeing some family.  Seems like she is processing a little about life.    Chaplain made patient aware of [place].    08/19/16 1417  Clinical Encounter Type  Visited With Patient  Visit Type Initial  Jeriah Corkum Ofilia Neas Resident

## 2016-08-22 ENCOUNTER — Non-Acute Institutional Stay (SKILLED_NURSING_FACILITY): Payer: Medicare Other | Admitting: Internal Medicine

## 2016-08-22 ENCOUNTER — Encounter: Payer: Self-pay | Admitting: Internal Medicine

## 2016-08-22 DIAGNOSIS — D500 Iron deficiency anemia secondary to blood loss (chronic): Secondary | ICD-10-CM | POA: Diagnosis not present

## 2016-08-22 DIAGNOSIS — I10 Essential (primary) hypertension: Secondary | ICD-10-CM

## 2016-08-22 DIAGNOSIS — E118 Type 2 diabetes mellitus with unspecified complications: Secondary | ICD-10-CM | POA: Diagnosis not present

## 2016-08-22 DIAGNOSIS — B379 Candidiasis, unspecified: Secondary | ICD-10-CM

## 2016-08-22 DIAGNOSIS — N200 Calculus of kidney: Secondary | ICD-10-CM

## 2016-08-22 DIAGNOSIS — N3000 Acute cystitis without hematuria: Secondary | ICD-10-CM

## 2016-08-22 DIAGNOSIS — R531 Weakness: Secondary | ICD-10-CM | POA: Diagnosis not present

## 2016-08-22 DIAGNOSIS — K219 Gastro-esophageal reflux disease without esophagitis: Secondary | ICD-10-CM | POA: Diagnosis not present

## 2016-08-22 NOTE — Progress Notes (Signed)
LOCATION: [name]  PCP: Blanchie Serve, MD   Code Status: Full Code  Goals of care: Advanced Directive information Advanced Directives 08/15/2016  Does Patient Have a Medical Advance Directive? Yes  Type of Advance Directive [place]  Does patient want to make changes to medical advance directive? No - Patient declined  Copy of [place] in Chart? No - copy requested  Would patient like information on creating a medical advance directive? -  Pre-existing out of facility DNR order (yellow form or pink MOST form) -       Extended Emergency Contact Information Primary Emergency Contact: [facility] Address: [address]          [place], [place] [address] Johnnette Litter of [place] Phone: [phone] Mobile Phone: [phone] Relation: Relative Secondary Emergency Contact: Hughes,Michael Address: [address]          [place], [place] [address] Johnnette Litter of [place] Phone: [phone] Mobile Phone: [phone] Relation: Relative   No Known Allergies  Chief Complaint  Patient presents with  . Readmit To SNF    Readmission Visit      HPI:  Patient is a 56 y.o. female seen today for short term rehabilitation post hospital re-admission from 08/15/16-08/19/16 with left kidney stone. She underwent left percutaneous nephrolithotomy of 3 stones, 2 large left renal stones and 1 large left distal ureteral stone. Her urine culture grew e.coli and proteus and she was placed on antibiotic. She was also started on antibiotic for yeast infection. She is seen in her room today. She has medical history of multiple sclerosis, ckd, CVA among others.   Review of Systems:  Constitutional: Negative for fever, chills, diaphoresis. positive for low energy level.  HENT: Negative for headache, congestion, nasal discharge, difficulty swallowing.   Eyes: Negative for blurred vision, double vision and discharge.  Respiratory:  Positive for cough with clear phlegm. Negative for shortness of breath and wheezing.   Cardiovascular: Negative for chest pain, palpitations, leg swelling.  Gastrointestinal: Negative for heartburn, nausea, vomiting, abdominal pain, loss of appetite. She had a bowel movement yesterday.  Genitourinary: Negative for dysuria. positive for left flank area discomfort. Musculoskeletal: Negative for back pain, fall.  Skin: Negative for itching, rash.  Neurological: Negative for dizziness. Psychiatric/Behavioral: Negative for depression   Past Medical History:  Diagnosis Date  . Acute renal failure superimposed on stage 3 chronic kidney disease ([place]) 07/21/2015  . Anemia    chronic   . Diabetes mellitus without complication ([place])   . Diabetic neuropathy ([place])   . DVT (deep venous thrombosis) ([place])   . DVT (deep venous thrombosis) ([place])   . GERD (gastroesophageal reflux disease)   . Hx of sepsis   . Hypertension   . Left nephrolithiasis   . Leukocytosis   . Lymph edema    chronic   . Malnutrition of moderate degree 07/21/2015  . Multiple sclerosis diagnosed 2002-not on Therapy any longer 06/26/2012  . Muscle weakness (generalized)   . Neuromuscular disorder ([place])   . Neuromuscular dysfunction of bladder   . Polyneuropathy ([place])   . Presence of indwelling urinary catheter   . Protein calorie malnutrition ([place])   . Stage 4 decubitus ulcer ([place]) 07/05/2015  . Stroke ([place])   . UTI (lower urinary tract infection)    Past Surgical History:  Procedure Laterality Date  . COLONOSCOPY WITH PROPOFOL N/A 07/25/2016   Procedure: COLONOSCOPY WITH PROPOFOL;  Surgeon: Laurence Spates, MD;  Location:  [facility];  Service: Endoscopy;  Laterality: N/A;  . ESOPHAGOGASTRODUODENOSCOPY Left 01/02/2015   Procedure: ESOPHAGOGASTRODUODENOSCOPY (EGD);  Surgeon: Arta Silence, MD;  Location: [facility];  Service: Endoscopy;  Laterality: Left;  . ESOPHAGOGASTRODUODENOSCOPY (EGD) WITH PROPOFOL N/A 07/25/2016    Procedure: ESOPHAGOGASTRODUODENOSCOPY (EGD) WITH PROPOFOL;  Surgeon: Laurence Spates, MD;  Location: [facility];  Service: Endoscopy;  Laterality: N/A;  . HERNIA MESH REMOVAL    . HOLMIUM LASER APPLICATION Left 0/11/2374   Procedure: HOLMIUM LASER APPLICATION;  Surgeon: Franchot Gallo, MD;  Location: [facility];  Service: Urology;  Laterality: Left;  . IR GENERIC HISTORICAL  02/23/2016   IR NEPHROSTOMY EXCHANGE LEFT 02/23/2016 Sandi Mariscal, MD [facility]  . IR GENERIC HISTORICAL  04/19/2016   IR NEPHROSTOMY EXCHANGE LEFT 04/19/2016 Arne Cleveland, MD [facility]  . IR GENERIC HISTORICAL  05/13/2016   IR NEPHROSTOMY EXCHANGE LEFT 05/13/2016 Corrie Mckusick, DO [facility]  . IR GENERIC HISTORICAL  05/13/2016   IR NEPHROSTOMY PLACEMENT RIGHT 05/13/2016 Corrie Mckusick, DO [facility]  . IR GENERIC HISTORICAL  07/07/2016   IR NEPHROSTOMY EXCHANGE RIGHT 07/07/2016 Jacqulynn Cadet, MD [facility]  . IR GENERIC HISTORICAL  07/07/2016   IR NEPHROSTOMY EXCHANGE LEFT 07/07/2016 Jacqulynn Cadet, MD [facility]  . NEPHROLITHOTOMY Left 08/18/2016   Procedure: NEPHROLITHOTOMY LEFT PERCUTANEOUS;  Surgeon: Franchot Gallo, MD;  Location: [facility];  Service: Urology;  Laterality: Left;  . NEPHROSTOMY TUBE PLACEMENT ([place] [facility])    . TRANSURETHRAL RESECTION OF BLADDER TUMOR N/A 03/16/2016   Procedure: CYSTOSCOPY BLADDER BIOPSY;  Surgeon: Franchot Gallo, MD;  Location: [facility];  Service: Urology;  Laterality: N/A;  . TUBAL LIGATION    . tubes tided     Social History:   reports that she quit smoking about 21 months ago. Her smoking use included Cigarettes. She has a 20.00 pack-year smoking history. She has never used smokeless tobacco. She reports that she drinks about 3.0 oz of alcohol per week . She reports that she does not use drugs.  Family History  Problem Relation Age of Onset  . Diabetes Mother   . Alzheimer's disease Mother   . Diabetes Father   . Diabetes Sister   . Scoliosis Sister      Medications: Allergies as of 08/22/2016   No Known Allergies     Medication List       Accurate as of 08/22/16 10:02 AM. Always use your most recent med list.          acetaminophen 325 MG tablet Commonly known as:  TYLENOL Take 2 tablets (650 mg total) by mouth every 6 (six) hours as needed for fever.   albuterol (2.5 MG/3ML) 0.083% nebulizer solution Commonly known as:  PROVENTIL Take 3 mLs (2.5 mg total) by nebulization every 6 (six) hours as needed for wheezing or shortness of breath.   atorvastatin 10 MG tablet Commonly known as:  LIPITOR Take 10 mg by mouth at bedtime.   cephALEXin 500 MG capsule Commonly known as:  KEFLEX Take 1 capsule (500 mg total) by mouth 3 (three) times daily.   famotidine 20 MG tablet Commonly known as:  PEPCID Take 1 tablet (20 mg total) by mouth 2 (two) times daily.   fluconazole 200 MG tablet Commonly known as:  DIFLUCAN Take 200 mg by mouth daily.   gabapentin 100 MG capsule Commonly known as:  NEURONTIN Take 1 capsule (100 mg total) by mouth at bedtime.   glipiZIDE 2.5 mg Tabs tablet Commonly known as:  GLUCOTROL Take  2.5 mg by mouth daily before breakfast.   HYDROcodone-homatropine 5-1.5 MG/5ML syrup Commonly known as:  HYCODAN Take 5 mLs by mouth every 6 (six) hours as needed for cough.   lisinopril 2.5 MG tablet Commonly known as:  PRINIVIL,ZESTRIL Take 2.5 mg by mouth daily.   Magnesium 400 MG Tabs Take 400 mg by mouth daily.   metFORMIN 500 MG tablet Commonly known as:  GLUCOPHAGE Take 0.5 tablets (250 mg total) by mouth 2 (two) times daily.   multivitamin with minerals Tabs tablet Take 1 tablet by mouth daily.   nystatin cream Commonly known as:  MYCOSTATIN Apply 1 application topically 2 (two) times daily. Apply to groin and buttock twice daily for fungal rash   polyethylene glycol packet Commonly known as:  MIRALAX / GLYCOLAX Take 17 g by mouth 2 (two) times daily.   potassium chloride 10 MEQ  tablet Commonly known as:  K-DUR,KLOR-CON Take 1 tablet (10 mEq total) by mouth daily.   sulfamethoxazole-trimethoprim 800-160 MG tablet Commonly known as:  BACTRIM DS,SEPTRA DS Take 1 tablet by mouth 2 (two) times daily.       Immunizations: Immunization History  Administered Date(s) Administered  . Influenza Split 06/26/2012  . Influenza-Unspecified 04/02/2015  . PPD Test 01/04/2015, 07/09/2015, 07/23/2015, 09/02/2015, 12/31/2015, 07/28/2016  . Pneumococcal-Unspecified 03/13/2014     Physical Exam: Vitals:   08/22/16 0920  BP: 129/76  Resp: 20  Temp: 97.6 F (36.4 C)  TempSrc: Oral  SpO2: 97%  Weight: 139 lb (63 kg)  Height: 5\' 6"  (1.676 m)   Body mass index is 22.44 kg/m.  General- adult female, frail, chronically ill appearing, in no acute distress Head- normocephalic, atraumatic Nose- no nasal discharge Throat- moist mucus membrane  Eyes- PERRLA, EOMI, no pallor, no icterus, no discharge Neck- no cervical lymphadenopathy Cardiovascular- normal s1,s2, no murmur Respiratory- bilateral clear to auscultation, no wheeze, no rhonchi, no crackles, no use of accessory muscles Abdomen- bowel sounds present, soft, non tender, foley catheter present, nephrostomy tube site clean and draining well Musculoskeletal- generalized weakness most prominent in her lower extremity Neurological- alert and oriented to person, place and time Skin- warm and dry, chronic skin changes to her lower legs noted Psychiatry- normal mood and affect    Labs reviewed: Basic Metabolic Panel:  Recent Labs  05/08/16 1800  07/15/16 0500  07/27/16 0509 07/28/16 0435 08/05/16 08/15/16 2302  08/17/16 0431 08/18/16 0452 08/19/16 0825  NA  --   < > 140  < > 142 145 141 137  --   --   --   --   K  --   < > 3.9  < > 3.2* 3.3* 4.0 4.1  --   --   --   --   CL  --   < > 113*  < > 113* 117*  --  107  --   --   --   --   CO2  --   < > 21*  < > 25 23  --  25  --   --   --   --   GLUCOSE  --   <  > 92  < > 105* 106*  --  105*  --   --   --   --   BUN  --   < > 14  < > 7 5* 14 23*  --   --   --   --   CREATININE  --   < > 0.88  < >  0.71 0.60 0.6 0.74  < > 0.69 0.84 0.91  CALCIUM  --   < > 9.3  < > 9.4 9.5  --  10.6*  --   --   --   --   MG 2.0  --  1.7  --   --  1.4*  --   --   --   --   --   --   PHOS 2.2*  --  2.6  --   --  2.5  --   --   --   --   --   --   < > = values in this interval not displayed. Liver Function Tests:  Recent Labs  07/16/16 0749 07/28/16 0435 08/15/16 2302  AST 30 11* 19  ALT 47 14 13*  ALKPHOS 136* 58 61  BILITOT 0.6 0.9 0.4  PROT 6.0* 7.1 8.1  ALBUMIN 1.9* 2.4* 3.4*   No results for input(s): LIPASE, AMYLASE in the last 8760 hours. No results for input(s): AMMONIA in the last 8760 hours. CBC:  Recent Labs  07/20/16 0414  07/27/16 0509 07/28/16 0435 08/05/16 08/15/16 2302 08/19/16 0431  WBC 10.6*  < > 8.9 9.4 11.5 11.5*  --   NEUTROABS 6.9  --   --  5.2  --  5.6  --   HGB 7.9*  < > 7.6* 7.8* 8.7* 9.3* 8.9*  HCT 25.2*  < > 23.5* 24.3* 28* 29.5* 27.3*  MCV 87.8  < > 87.7 85.6  --  88.9  --   PLT 447*  < > 389 377 497* 355  --   < > = values in this interval not displayed. Cardiac Enzymes:  Recent Labs  03/09/16 0305 05/05/16 0830 05/05/16 1450  TROPONINI <0.03 0.17* 0.18*   BNP: Invalid input(s): POCBNP CBG:  Recent Labs  08/15/16 1745 08/18/16 1212 08/18/16 1518  GLUCAP 141* 88 98    Radiological Exams: Ct Abdomen Pelvis W Contrast  Result Date: 07/26/2016 CLINICAL DATA:  Pelvic fluid collections, fever. EXAM: CT ABDOMEN AND PELVIS WITH CONTRAST TECHNIQUE: Multidetector CT imaging of the abdomen and pelvis was performed using the standard protocol following bolus administration of intravenous contrast. CONTRAST:  143mL ISOVUE-300 IOPAMIDOL (ISOVUE-300) INJECTION 61% COMPARISON:  CT scan of July 15, 2016. FINDINGS: Lower chest: Minimal bilateral pleural effusions are noted with adjacent subsegmental atelectasis.  Hepatobiliary: No gallstones are noted. Small hepatic cysts are noted. Pancreas: Unremarkable. No pancreatic ductal dilatation or surrounding inflammatory changes. Spleen: Normal in size without focal abnormality. Adrenals/Urinary Tract: Adrenal glands appear normal. Bilateral nephrostomy catheters are noted in grossly good position. 18 mm nonobstructive calculus is noted in upper pole collecting system of left kidney. 17 mm calculus is noted in left renal pelvis. Moderate left hydronephrosis is noted secondary to 16 x 7 mm calculus in distal left ureter. Urinary bladder is decompressed secondary to Foley catheter. Stomach/Bowel: There is no evidence of bowel obstruction. The appendix appears normal. Vascular/Lymphatic: Left retroperitoneal lymph nodes are again noted which are improved compared to prior exam, with the largest measuring 9 mm. Reproductive: Stable large fibroid uterus is noted. Ovaries are unremarkable. Other: The fluid collections seen anteriorly in the pelvis on the prior exam appear to have nearly completely resolved. Small residual fluid collection measuring 4 mm is noted anteriorly in the left. Musculoskeletal: No acute or significant osseous findings. IMPRESSION: Bilateral nephrostomy tubes are in grossly good position. Urinary bladder is decompressed secondary to Foley catheter. Large nonobstructive calculi are noted in the  upper pole collecting system of left kidney and left renal pelvis. Moderate left hydronephrosis is noted secondary to 16 x 7 mm calculus in distal left ureter. Stable large fibroid uterus. Left retroperitoneal adenopathy is again noted, but improved compared to prior exam. Fluid collections seen anteriorly in the pelvis on the prior exam appear to have nearly completely resolved. Electronically Signed   By: Marijo Conception, M.D.   On: 07/26/2016 11:22   Dg C-arm 61-120 Min-no Report  Result Date: 08/18/2016 Fluoroscopy was utilized by the requesting physician.  No  radiographic interpretation.   Ct Image Guided Drainage By Percutaneous Catheter  Result Date: 07/27/2016 INDICATION: 56 year old with multiple sclerosis and bilateral nephrostomy tubes. Concern for a bladder perforation. Cystoscopy demonstrated bladder dome inflammation and urology requested placement of a suprapubic tube. EXAM: CT-GUIDED PLACEMENT OF SUPRAPUBIC BLADDER CATHETER COMPARISON:  None. MEDICATIONS: Patient was already on scheduled antibiotics. ANESTHESIA/SEDATION: Fentanyl 200 mcg IV; Versed 4.0 mg IV Moderate Sedation Time:  42 minutes The patient was continuously monitored during the procedure by the interventional radiology nurse under my direct supervision. CONTRAST:  None FLUOROSCOPY TIME:  None COMPLICATIONS: None immediate. PROCEDURE: Informed written consent was obtained from the patient after a thorough discussion of the procedural risks, benefits and alternatives. All questions were addressed. A timeout was performed prior to the initiation of the procedure. CT images through the pelvis were obtained. The urinary bladder was initially decompressed with a Foley catheter. As a result, 250 mL of sterile saline was instilled in the bladder through the Foley catheter. Once the bladder was adequately distended, the anterior pelvis was prepped and draped in a sterile fashion. Skin was anesthetized with 1% lidocaine. 18 gauge trocar needle was directed into the bladder with CT guidance and a stiff Amplatz wire was placed. The tract was dilated to accommodate a 12 [place] multipurpose drain. The drain was easily advanced over the wire and reconstituted in the bladder. Catheter was sutured to the skin with suture and attached to a gravity bag. The Foley catheter balloon was deflated and the Foley catheter was removed without complication. FINDINGS: Initial images demonstrated a decompressed urinary bladder with a Foley catheter. Bladder was adequate distended with normal saline. Evidence for multiple  gas-filled bladder diverticula but no definite bladder leak. 12 French drain was successfully advanced into the bladder. IMPRESSION: Successful placement of a CT-guided suprapubic bladder catheter. Electronically Signed   By: Markus Daft M.D.   On: 07/27/2016 12:06    Assessment/Plan  Generalized weakness From deconditioning. Will have her work with physical therapy and occupational therapy team to help with gait training and muscle strengthening exercises.fall precautions. Skin care. Encourage to be out of bed.   Left kidney stone s/p percutaneous nephrolithotomy. Will need f/u with urology.   Acute cystitis With e.coli and proteus in urine culture. Continue and complete course of bactrim ds bid for 10 days and keflex 500mg  tid for 1 week. hydration and perineal hygiene to be maintained  Yeast infection Continue and complete 2 week course of diflucan  Blood loss anemia Post op, monitor cbc  Type 2 DM Lab Results  Component Value Date   HGBA1C 5.3 07/15/2016   On metformin 250 mg bid at present with glipizide 2.5 mg daily. Monitor cbg. Monitor for hypoglycemia.   gerd Stable, continue pepcid and monitor  HTN Monitor BP, continue lisinopril 2.5 mg daily   Goals of care: short term rehabilitation   Labs/tests ordered: cbc, cmp 08/24/16  Family/ staff Communication: reviewed  care plan with patient and nursing supervisor    Blanchie Serve, MD Internal Medicine [place], [place] [address] Cell Phone (Monday-Friday 8 am - 5 pm): [phone] On Call: [phone] and follow prompts after 5 pm and on weekends Office Phone: [phone] Office Fax: [phone]

## 2016-08-22 NOTE — Addendum Note (Signed)
Addendum  created 08/22/16 0926 by Lynda Rainwater, MD   Anesthesia Attestations filed

## 2016-08-24 LAB — CBC AND DIFFERENTIAL
HCT: 30 % — AB (ref 36–46)
Hemoglobin: 9.5 g/dL — AB (ref 12.0–16.0)
PLATELETS: 387 10*3/uL (ref 150–399)
WBC: 10 10*3/mL

## 2016-08-24 LAB — BASIC METABOLIC PANEL
BUN: 18 mg/dL (ref 4–21)
CREATININE: 0.9 mg/dL (ref 0.5–1.1)
GLUCOSE: 56 mg/dL
POTASSIUM: 5.2 mmol/L (ref 3.4–5.3)
Sodium: 138 mmol/L (ref 137–147)

## 2016-08-24 LAB — HEPATIC FUNCTION PANEL
ALT: 17 U/L (ref 7–35)
AST: 12 U/L — AB (ref 13–35)
Alkaline Phosphatase: 70 U/L (ref 25–125)
Bilirubin, Total: 0.2 mg/dL

## 2016-09-01 ENCOUNTER — Ambulatory Visit (HOSPITAL_COMMUNITY)
Admit: 2016-09-01 | Discharge: 2016-09-01 | Disposition: A | Discharge status: Home / Self Care | Payer: Medicare Other | Attending: Urology | Admitting: Urology

## 2016-09-01 DIAGNOSIS — N135 Crossing vessel and stricture of ureter without hydronephrosis: Secondary | ICD-10-CM

## 2016-09-20 ENCOUNTER — Other Ambulatory Visit: Payer: Self-pay | Admitting: Urology

## 2016-09-20 DIAGNOSIS — N135 Crossing vessel and stricture of ureter without hydronephrosis: Secondary | ICD-10-CM

## 2016-09-23 ENCOUNTER — Encounter: Payer: Self-pay | Admitting: Family

## 2016-09-23 ENCOUNTER — Non-Acute Institutional Stay (SKILLED_NURSING_FACILITY): Payer: Medicare Other | Admitting: Family

## 2016-09-23 DIAGNOSIS — I10 Essential (primary) hypertension: Secondary | ICD-10-CM

## 2016-09-23 DIAGNOSIS — E1169 Type 2 diabetes mellitus with other specified complication: Secondary | ICD-10-CM

## 2016-09-23 DIAGNOSIS — E118 Type 2 diabetes mellitus with unspecified complications: Secondary | ICD-10-CM | POA: Diagnosis not present

## 2016-09-23 DIAGNOSIS — E785 Hyperlipidemia, unspecified: Secondary | ICD-10-CM | POA: Diagnosis not present

## 2016-09-23 NOTE — Progress Notes (Signed)
Location:  [facility] Room Number: [place] of Service:  SNF (31) Provider:  Alfonso Shackett FNP-C  Blanchie Serve, MD  Patient Care Team: Blanchie Serve, MD as PCP - General (Internal Medicine) Franchot Gallo, MD as Consulting Physician (Urology)  Extended Emergency Contact Information Primary Emergency Contact: [facility] Address: [address]          [place], [place] [address] Johnnette Litter of [place] Phone: [phone] Mobile Phone: [phone] Relation: Relative Secondary Emergency Contact: Hughes,Michael Address: [address]          [place], [place] [address] Johnnette Litter of [place] Phone: [phone] Mobile Phone: [phone] Relation: Relative  Code Status:  Full code Goals of care: Advanced Directive information Advanced Directives 09/23/2016  Does Patient Have a Medical Advance Directive? No  Type of Advance Directive -  Does patient want to make changes to medical advance directive? No - Patient declined  Copy of [place] in Chart? -  Would patient like information on creating a medical advance directive? -  Pre-existing out of facility DNR order (yellow form or pink MOST form) -     Chief Complaint  Patient presents with  . Medical Management of Chronic Issues    Routine Visit    HPI:  Pt is a 56 y.o. female seen today at [facility] for an acute visit for evaluation of abnormal lab results. She has a significant medical history of Type 2 DM,MS, Hydronephrosis with Nephrostomy tube, CKD stage 3, Neurogenic Bladder, dyslipidemia among other conditions.She seen in her room today with her cousin at the bedside. She denies any acute issues this visit. Her bilateral nephrostomy dressing managed by facility wound care Nurse. She has upcoming appointment with [facility]t.Facility Nurse reports no new concerns.   Past Medical History:  Diagnosis Date  . Acute renal  failure superimposed on stage 3 chronic kidney disease ([place]) 07/21/2015  . Anemia    chronic   . Diabetes mellitus without complication ([place])   . Diabetic neuropathy ([place])   . DVT (deep venous thrombosis) ([place])   . DVT (deep venous thrombosis) ([place])   . GERD (gastroesophageal reflux disease)   . Hx of sepsis   . Hypertension   . Left nephrolithiasis   . Leukocytosis   . Lymph edema    chronic   . Malnutrition of moderate degree 07/21/2015  . Multiple sclerosis diagnosed 2002-not on Therapy any longer 06/26/2012  . Muscle weakness (generalized)   . Neuromuscular disorder ([place])   . Neuromuscular dysfunction of bladder   . Polyneuropathy ([place])   . Presence of indwelling urinary catheter   . Protein calorie malnutrition ([place])   . Stage 4 decubitus ulcer ([place]) 07/05/2015  . Stroke ([place])   . UTI (lower urinary tract infection)    Past Surgical History:  Procedure Laterality Date  . COLONOSCOPY WITH PROPOFOL N/A 07/25/2016   Procedure: COLONOSCOPY WITH PROPOFOL;  Surgeon: Laurence Spates, MD;  Location: [facility];  Service: Endoscopy;  Laterality: N/A;  . ESOPHAGOGASTRODUODENOSCOPY Left 01/02/2015   Procedure: ESOPHAGOGASTRODUODENOSCOPY (EGD);  Surgeon: Arta Silence, MD;  Location: [facility];  Service: Endoscopy;  Laterality: Left;  . ESOPHAGOGASTRODUODENOSCOPY (EGD) WITH PROPOFOL N/A 07/25/2016   Procedure: ESOPHAGOGASTRODUODENOSCOPY (EGD) WITH PROPOFOL;  Surgeon: Laurence Spates, MD;  Location: [facility];  Service: Endoscopy;  Laterality: N/A;  . HERNIA MESH REMOVAL    . HOLMIUM LASER APPLICATION Left 0/02/4708   Procedure: HOLMIUM LASER APPLICATION;  Surgeon: Franchot Gallo, MD;  Location: [facility]S;  Service: Urology;  Laterality: Left;  . IR GENERIC HISTORICAL  02/23/2016   IR NEPHROSTOMY EXCHANGE LEFT 02/23/2016 Sandi Mariscal, MD [facility]  . IR GENERIC HISTORICAL  04/19/2016   IR NEPHROSTOMY EXCHANGE LEFT 04/19/2016 Arne Cleveland, MD [facility]  . IR GENERIC HISTORICAL   05/13/2016   IR NEPHROSTOMY EXCHANGE LEFT 05/13/2016 Corrie Mckusick, DO [facility]  . IR GENERIC HISTORICAL  05/13/2016   IR NEPHROSTOMY PLACEMENT RIGHT 05/13/2016 Corrie Mckusick, DO [facility]  . IR GENERIC HISTORICAL  07/07/2016   IR NEPHROSTOMY EXCHANGE RIGHT 07/07/2016 Jacqulynn Cadet, MD [facility]  . IR GENERIC HISTORICAL  07/07/2016   IR NEPHROSTOMY EXCHANGE LEFT 07/07/2016 Jacqulynn Cadet, MD [facility]  . NEPHROLITHOTOMY Left 08/18/2016   Procedure: NEPHROLITHOTOMY LEFT PERCUTANEOUS;  Surgeon: Franchot Gallo, MD;  Location: [facility];  Service: Urology;  Laterality: Left;  . NEPHROSTOMY TUBE PLACEMENT ([place] HX)    . TRANSURETHRAL RESECTION OF BLADDER TUMOR N/A 03/16/2016   Procedure: CYSTOSCOPY BLADDER BIOPSY;  Surgeon: Franchot Gallo, MD;  Location[facility];  Service: Urology;  Laterality: N/A;  . TUBAL LIGATION    . tubes tided      No Known Allergies  Allergies as of 09/23/2016   No Known Allergies     Medication List       Accurate as of 09/23/16  5:22 PM. Always use your most recent med list.          acetaminophen 325 MG tablet Commonly known as:  TYLENOL Take 2 tablets (650 mg total) by mouth every 6 (six) hours as needed for fever.   albuterol (2.5 MG/3ML) 0.083% nebulizer solution Commonly known as:  PROVENTIL Take 3 mLs (2.5 mg total) by nebulization every 6 (six) hours as needed for wheezing or shortness of breath.   atorvastatin 10 MG tablet Commonly known as:  LIPITOR Take 10 mg by mouth at bedtime.   famotidine 20 MG tablet Commonly known as:  PEPCID Take 1 tablet (20 mg total) by mouth 2 (two) times daily.   gabapentin 100 MG capsule Commonly known as:  NEURONTIN Take 1 capsule (100 mg total) by mouth at bedtime.   glipiZIDE 2.5 mg Tabs tablet Commonly known as:  GLUCOTROL Take 2.5 mg by mouth daily before breakfast.   HYDROcodone-homatropine 5-1.5 MG/5ML syrup Commonly known as:  HYCODAN Take 5 mLs by mouth every 6 (six) hours as needed  for cough.   lisinopril 2.5 MG tablet Commonly known as:  PRINIVIL,ZESTRIL Take 2.5 mg by mouth daily.   Magnesium 400 MG Tabs Take 400 mg by mouth daily.   metFORMIN 500 MG tablet Commonly known as:  GLUCOPHAGE Take 0.5 tablets (250 mg total) by mouth 2 (two) times daily.   multivitamin with minerals Tabs tablet Take 1 tablet by mouth daily.   nystatin cream Commonly known as:  MYCOSTATIN Apply 1 application topically 2 (two) times daily. Apply to groin and buttock twice daily for fungal rash   polyethylene glycol packet Commonly known as:  MIRALAX / GLYCOLAX Take 17 g by mouth 2 (two) times daily.   potassium chloride 10 MEQ tablet Commonly known as:  K-DUR,KLOR-CON Take 1 tablet (10 mEq total) by mouth daily.       Review of Systems  Constitutional: Negative for activity change, appetite change, chills, fatigue and fever.  HENT: Negative for congestion, rhinorrhea, sinus pressure, sneezing and sore throat.   Eyes: Negative.   Respiratory: Negative for cough, chest  tightness, shortness of breath and wheezing.   Cardiovascular: Negative for chest pain, palpitations and leg swelling.  Gastrointestinal: Negative for abdominal distention, abdominal pain, constipation, diarrhea, nausea and vomiting.  Endocrine: Negative.   Genitourinary: Negative for flank pain and urgency.       Foley Catheter,Left and right Nephrostomy   Musculoskeletal: Positive for gait problem.  Skin: Negative for color change, pallor, rash and wound.  Neurological: Negative for dizziness, seizures, syncope, light-headedness, numbness and headaches.  Hematological: Does not bruise/bleed easily.  Psychiatric/Behavioral: Negative for agitation, confusion, hallucinations and sleep disturbance. The patient is not nervous/anxious.     Immunization History  Administered Date(s) Administered  . Influenza Split 06/26/2012  . Influenza-Unspecified 04/02/2015  . PPD Test 01/04/2015, 07/09/2015, 07/23/2015,  09/02/2015, 12/31/2015, 07/28/2016  . Pneumococcal-Unspecified 03/13/2014   Pertinent  Health Maintenance Due  Topic Date Due  . FOOT EXAM  10/19/1968  . OPHTHALMOLOGY EXAM  10/19/1968  . PAP SMEAR  10/20/1979  . MAMMOGRAM  09/19/2015  . INFLUENZA VACCINE  01/11/2017  . HEMOGLOBIN A1C  01/12/2017  . COLONOSCOPY  07/25/2026      Vitals:   09/23/16 0954  BP: 124/74  Pulse: 90  Resp: 18  Temp: 98.9 F (37.2 C)  TempSrc: Oral  SpO2: 98%  Weight: 135 lb 9.6 oz (61.5 kg)  Height: 5\' 6"  (1.676 m)   Body mass index is 21.89 kg/m. Physical Exam  Constitutional: She is oriented to person, place, and time. She appears well-developed and well-nourished. No distress.  HENT:  Head: Normocephalic.  Mouth/Throat: Oropharynx is clear and moist. No oropharyngeal exudate.  Eyes: Conjunctivae and EOM are normal. Pupils are equal, round, and reactive to light. Right eye exhibits no discharge. Left eye exhibits no discharge. No scleral icterus.  Neck: Normal range of motion. No JVD present. No thyromegaly present.  Cardiovascular: Normal rate, regular rhythm, normal heart sounds and intact distal pulses.  Exam reveals no gallop and no friction rub.   No murmur heard. Pulmonary/Chest: Effort normal and breath sounds normal. No respiratory distress. She has no wheezes. She has no rales.  Abdominal: Soft. Bowel sounds are normal. She exhibits no distension. There is no rebound.  Genitourinary:  Genitourinary Comments: Foley cathter and Left Nephrostomy draining clear yellow urine   Musculoskeletal: She exhibits no edema, tenderness or deformity.  Wheelchair bound. RUE /Bilateral LE's weakness.  Lymphadenopathy:    She has no cervical adenopathy.  Neurological: She is oriented to person, place, and time.  Skin: Skin is warm and dry. No rash noted. No erythema. No pallor.  Bilateral nephrostomy. Right nephrostomy surrounding skin with open dime size wound: wound bed pink with small  serosanguinous drainage on the dressing.   Psychiatric: She has a normal mood and affect.    Labs reviewed:  Recent Labs  05/08/16 1800  07/15/16 0500  07/27/16 0509 07/28/16 0435 08/05/16 08/15/16 2302  08/17/16 0431 08/18/16 0452 08/19/16 0825  NA  --   < > 140  < > 142 145 141 137  --   --   --   --   K  --   < > 3.9  < > 3.2* 3.3* 4.0 4.1  --   --   --   --   CL  --   < > 113*  < > 113* 117*  --  107  --   --   --   --   CO2  --   < > 21*  < > 25  23  --  25  --   --   --   --   GLUCOSE  --   < > 92  < > 105* 106*  --  105*  --   --   --   --   BUN  --   < > 14  < > 7 5* 14 23*  --   --   --   --   CREATININE  --   < > 0.88  < > 0.71 0.60 0.6 0.74  < > 0.69 0.84 0.91  CALCIUM  --   < > 9.3  < > 9.4 9.5  --  10.6*  --   --   --   --   MG 2.0  --  1.7  --   --  1.4*  --   --   --   --   --   --   PHOS 2.2*  --  2.6  --   --  2.5  --   --   --   --   --   --   < > = values in this interval not displayed.  Recent Labs  07/16/16 0749 07/28/16 0435 08/15/16 2302  AST 30 11* 19  ALT 47 14 13*  ALKPHOS 136* 58 61  BILITOT 0.6 0.9 0.4  PROT 6.0* 7.1 8.1  ALBUMIN 1.9* 2.4* 3.4*    Recent Labs  07/20/16 0414  07/27/16 0509 07/28/16 0435 08/05/16 08/15/16 2302 08/19/16 0431  WBC 10.6*  < > 8.9 9.4 11.5 11.5*  --   NEUTROABS 6.9  --   --  5.2  --  5.6  --   HGB 7.9*  < > 7.6* 7.8* 8.7* 9.3* 8.9*  HCT 25.2*  < > 23.5* 24.3* 28* 29.5* 27.3*  MCV 87.8  < > 87.7 85.6  --  88.9  --   PLT 447*  < > 389 377 497* 355  --   < > = values in this interval not displayed. Lab Results  Component Value Date   TSH 1.787 07/21/2015   Lab Results  Component Value Date   HGBA1C 5.3 07/15/2016   Lab Results  Component Value Date   CHOL 169 02/18/2016   HDL 52 02/18/2016   LDLCALC 95 02/18/2016   TRIG 109 02/18/2016   Assessment/Plan Type 2 DM Lab Results  Component Value Date   HGBA1C 5.3 07/15/2016   CBG's ranging in 80's-170's. Continue on Glipizide 2.5 mg Tablet and  metformin 250 mg tablet twice daily. Continue on atorvastatin and ACE inhibitor. Check TSH level 09/26/2016.Refer to Podiatrist and Opthalmology for annual foot and eye exam accordingly.  HTN B/p stable. Continue on lisinopril 2.5 mg Tablet. Monitor BMP.   Dyslipidemia  Continue on atorvastatin 10 mg Tablet. Obtain fasting lipid panel 09/26/2016.   Family/ staff Communication: Reviewed plan of care with patient and facility Nurse supervisor.   Labs/tests ordered:  Urine microalbumin ratio, TSH level and fasting lipid panel 09/26/2016

## 2016-09-26 ENCOUNTER — Other Ambulatory Visit: Payer: Self-pay | Admitting: Radiology

## 2016-09-27 ENCOUNTER — Ambulatory Visit (HOSPITAL_COMMUNITY): Admission: RE | Admit: 2016-09-27 | Payer: Medicare Other | Source: Ambulatory Visit

## 2016-09-27 ENCOUNTER — Inpatient Hospital Stay (HOSPITAL_COMMUNITY): Admission: RE | Admit: 2016-09-27 | Payer: Medicare Other | Source: Ambulatory Visit

## 2016-09-27 DIAGNOSIS — Z79899 Other long term (current) drug therapy: Secondary | ICD-10-CM | POA: Diagnosis not present

## 2016-09-27 LAB — LIPID PANEL
CHOLESTEROL: 179 mg/dL (ref 0–200)
HDL: 56 mg/dL (ref 35–70)
LDL Cholesterol: 101 mg/dL
TRIGLYCERIDES: 112 mg/dL (ref 40–160)

## 2016-09-27 LAB — TSH: TSH: 2.33 u[IU]/mL (ref 0.41–5.90)

## 2016-09-28 ENCOUNTER — Telehealth (HOSPITAL_COMMUNITY): Payer: Self-pay | Admitting: Radiology

## 2016-09-28 DIAGNOSIS — N132 Hydronephrosis with renal and ureteral calculous obstruction: Secondary | ICD-10-CM | POA: Diagnosis not present

## 2016-09-28 DIAGNOSIS — N329 Bladder disorder, unspecified: Secondary | ICD-10-CM | POA: Diagnosis not present

## 2016-09-28 NOTE — Telephone Encounter (Signed)
rec'd call [facility].  Patient's suprapubic catheter is occluded and a urethral catheter is being placed today at [facility].  Dr. Alinda Money asks that IR remove this catheter when patient comes to IR appointment next week for bilateral PCN exchange and suprapubic catheter exchange.

## 2016-10-05 ENCOUNTER — Other Ambulatory Visit: Payer: Self-pay | Admitting: Radiology

## 2016-10-06 ENCOUNTER — Encounter (HOSPITAL_COMMUNITY): Payer: Self-pay

## 2016-10-06 ENCOUNTER — Ambulatory Visit (HOSPITAL_COMMUNITY)
Admission: RE | Admit: 2016-10-06 | Discharge: 2016-10-06 | Disposition: A | Discharge status: Home / Self Care | Payer: Medicare Other | Source: Ambulatory Visit | Attending: Urology | Admitting: Urology

## 2016-10-06 ENCOUNTER — Other Ambulatory Visit: Payer: Self-pay | Admitting: Urology

## 2016-10-06 DIAGNOSIS — Z87891 Personal history of nicotine dependence: Secondary | ICD-10-CM | POA: Diagnosis not present

## 2016-10-06 DIAGNOSIS — N319 Neuromuscular dysfunction of bladder, unspecified: Secondary | ICD-10-CM | POA: Diagnosis not present

## 2016-10-06 DIAGNOSIS — G35 Multiple sclerosis: Secondary | ICD-10-CM | POA: Diagnosis not present

## 2016-10-06 DIAGNOSIS — N135 Crossing vessel and stricture of ureter without hydronephrosis: Secondary | ICD-10-CM

## 2016-10-06 DIAGNOSIS — I10 Essential (primary) hypertension: Secondary | ICD-10-CM | POA: Diagnosis not present

## 2016-10-06 DIAGNOSIS — Z8673 Personal history of transient ischemic attack (TIA), and cerebral infarction without residual deficits: Secondary | ICD-10-CM | POA: Insufficient documentation

## 2016-10-06 DIAGNOSIS — K219 Gastro-esophageal reflux disease without esophagitis: Secondary | ICD-10-CM | POA: Insufficient documentation

## 2016-10-06 DIAGNOSIS — E785 Hyperlipidemia, unspecified: Secondary | ICD-10-CM | POA: Diagnosis not present

## 2016-10-06 DIAGNOSIS — Z436 Encounter for attention to other artificial openings of urinary tract: Secondary | ICD-10-CM | POA: Diagnosis not present

## 2016-10-06 DIAGNOSIS — Z79899 Other long term (current) drug therapy: Secondary | ICD-10-CM | POA: Diagnosis not present

## 2016-10-06 DIAGNOSIS — Z87442 Personal history of urinary calculi: Secondary | ICD-10-CM | POA: Diagnosis not present

## 2016-10-06 DIAGNOSIS — E1140 Type 2 diabetes mellitus with diabetic neuropathy, unspecified: Secondary | ICD-10-CM | POA: Insufficient documentation

## 2016-10-06 DIAGNOSIS — Z7984 Long term (current) use of oral hypoglycemic drugs: Secondary | ICD-10-CM | POA: Diagnosis not present

## 2016-10-06 DIAGNOSIS — Z833 Family history of diabetes mellitus: Secondary | ICD-10-CM | POA: Diagnosis not present

## 2016-10-06 DIAGNOSIS — N139 Obstructive and reflux uropathy, unspecified: Secondary | ICD-10-CM | POA: Diagnosis not present

## 2016-10-06 HISTORY — DX: Personal history of urinary calculi: Z87.442

## 2016-10-06 HISTORY — DX: Contracture, right hand: M24.541

## 2016-10-06 HISTORY — PX: IR NEPHROSTOMY EXCHANGE RIGHT: IMG6070

## 2016-10-06 HISTORY — PX: IR NEPHROSTOMY EXCHANGE LEFT: IMG6069

## 2016-10-06 HISTORY — DX: Hypokalemia: E87.6

## 2016-10-06 HISTORY — DX: Acute parametritis and pelvic cellulitis: N73.0

## 2016-10-06 HISTORY — PX: IR CATHETER TUBE CHANGE: IMG717

## 2016-10-06 HISTORY — DX: Hypomagnesemia: E83.42

## 2016-10-06 HISTORY — DX: Hyperlipidemia, unspecified: E78.5

## 2016-10-06 LAB — GLUCOSE, CAPILLARY: Glucose-Capillary: 81 mg/dL (ref 65–99)

## 2016-10-06 MED ORDER — SODIUM CHLORIDE 0.9 % IV SOLN: INTRAVENOUS | Status: DC

## 2016-10-06 MED ORDER — MIDAZOLAM HCL 2 MG/2ML IJ SOLN
INTRAMUSCULAR | Status: DC | PRN
  Administered 2016-10-06 (×3): 1 mg via INTRAVENOUS

## 2016-10-06 MED ORDER — IOPAMIDOL (ISOVUE-300) INJECTION 61%
50.0000 mL | Freq: Once | INTRAVENOUS | Status: AC | PRN
  Administered 2016-10-06: 20 mL

## 2016-10-06 MED ORDER — FENTANYL CITRATE (PF) 100 MCG/2ML IJ SOLN
INTRAMUSCULAR | Status: DC | PRN
  Administered 2016-10-06: 25 ug via INTRAVENOUS

## 2016-10-06 MED ORDER — IOPAMIDOL (ISOVUE-300) INJECTION 61%
INTRAVENOUS | Status: AC
  Administered 2016-10-06: 20 mL
  Filled 2016-10-06: qty 50

## 2016-10-06 MED ORDER — SODIUM CHLORIDE 0.9 % IV SOLN
1.0000 g | Freq: Once | INTRAVENOUS | Status: AC
  Administered 2016-10-06: 1 g via INTRAVENOUS
  Filled 2016-10-06: qty 1

## 2016-10-06 MED ORDER — LIDOCAINE HCL 1 % IJ SOLN
INTRAMUSCULAR | Status: DC | PRN
  Administered 2016-10-06: 5 mL

## 2016-10-06 MED ORDER — LIDOCAINE HCL 1 % IJ SOLN
INTRAMUSCULAR | Status: AC
  Filled 2016-10-06: qty 20

## 2016-10-06 MED ORDER — MIDAZOLAM HCL 2 MG/2ML IJ SOLN
INTRAMUSCULAR | Status: DC
Start: 2016-10-06 — End: 2016-10-07
  Filled 2016-10-06: qty 6

## 2016-10-06 MED ORDER — FENTANYL CITRATE (PF) 100 MCG/2ML IJ SOLN
INTRAMUSCULAR | Status: AC
  Filled 2016-10-06: qty 4

## 2016-10-06 NOTE — Discharge Instructions (Signed)
Moderate Conscious Sedation, Adult, Care After These instructions provide you with information about caring for yourself after your procedure. Your health care provider may also give you more specific instructions. Your treatment has been planned according to current medical practices, but problems sometimes occur. Call your health care provider if you have any problems or questions after your procedure. What can I expect after the procedure? After your procedure, it is common:  To feel sleepy for several hours.  To feel clumsy and have poor balance for several hours.  To have poor judgment for several hours.  To vomit if you eat too soon. Follow these instructions at home: For at least 24 hours after the procedure:    Do not:  Participate in activities where you could fall or become injured.  Drive.  Use heavy machinery.  Drink alcohol.  Take sleeping pills or medicines that cause drowsiness.  Make important decisions or sign legal documents.  Take care of children on your own.  Rest. Eating and drinking   Follow the diet recommended by your health care provider.  If you vomit:  Drink water, juice, or soup when you can drink without vomiting.  Make sure you have little or no nausea before eating solid foods. General instructions   Have a responsible adult stay with you until you are awake and alert.  Take over-the-counter and prescription medicines only as told by your health care provider.  If you smoke, do not smoke without supervision.  Keep all follow-up visits as told by your health care provider. This is important. Contact a health care provider if:  You keep feeling nauseous or you keep vomiting.  You feel light-headed.  You develop a rash.  You have a fever. Get help right away if:  You have trouble breathing. This information is not intended to replace advice given to you by your health care provider. Make sure you discuss any questions you  have with your health care provider. Document Released: 03/20/2013 Document Revised: 11/02/2015 Document Reviewed: 09/19/2015 Elsevier Interactive Patient Education  2017 [employer]   Percutaneous Nephrostomy, Care After This sheet gives you information about how to care for yourself after your procedure. Your health care provider may also give you more specific instructions. If you have problems or questions, contact your health care provider. What can I expect after the procedure? After the procedure, it is common to have:  Some soreness where the nephrostomy tube was inserted (tube insertion site).  Blood-tinged drainage from the nephrostomy tube for the first 24 hours. Follow these instructions at home: Activity   Return to your normal activities as told by your health care provider. Ask your health care provider what activities are safe for you.  Avoid activities that may cause the nephrostomy tubing to bend.  Do not take baths, swim, or use a hot tub until your health care provider approves. Ask your health care provider if you can take showers. Cover the nephrostomy tube dressing with a watertight covering when you take a shower.  Donot drive for 24 hours if you were given a medicine to help you relax (sedative). Care of the tube insertion site   Follow instructions from your health care provider about how to take care of your tube insertion site. Make sure you:  Wash your hands with soap and water before you change your bandage (dressing). If soap and water are not available, use hand sanitizer.  Change your dressing as told by your health care provider. Be  careful not to pull on the tube while removing the dressing.  When you change the dressing, wash the skin around the tube, rinse well, and pat the skin dry.  Check the tube insertion area every day for signs of infection. Check for:  More redness, swelling, or pain.  More fluid or blood.  Warmth.  Pus or a bad  smell. Care of the nephrostomy tube and drainage bag   Always keep the tubing, the leg bag, or the bedside drainage bags below the level of the kidney so that your urine drains freely.  When connecting your nephrostomy tube to a drainage bag, make sure that there are no kinks in the tubing and that your urine is draining freely. You may want to use an elastic bandage to wrap any exposed tubing that goes from the nephrostomy tube to any of the connecting tubes.  At night, you may want to connect your nephrostomy tube or the leg bag to a larger bedside drainage bag.  Follow instructions from your health care provider about how to empty or change the drainage bag.  Empty the drainage bag when it becomes ? full.  Replace the drainage bag and any extension tubing that is connected to your nephrostomy tube every 3 weeks or as often as told by your health care provider. Your health care provider will explain how to change the drainage bag and extension tubing. General instructions   Take over-the-counter and prescription medicines only as told by your health care provider.  Keep all follow-up visits as told by your health care provider. This is important. Contact a health care provider if:  You have problems with any of the valves or tubing.  You have persistent pain or soreness in your back.  You have more redness, swelling, or pain around your tube insertion site.  You have more fluid or blood coming from your tube insertion site.  Your tube insertion site feels warm to the touch.  You have pus or a bad smell coming from your tube insertion site.  You have increased urine output or you feel burning when urinating. Get help right away if:  You have pain in your abdomen during the first week.  You have chest pain or have trouble breathing.  You have a new appearance of blood in your urine.  You have a fever or chills.  You have back pain that is not relieved by your  medicine.  You have decreased urine output.  Your nephrostomy tube comes out. This information is not intended to replace advice given to you by your health care provider. Make sure you discuss any questions you have with your health care provider. Document Released: 01/21/2004 Document Revised: 03/11/2016 Document Reviewed: 03/11/2016 Elsevier Interactive Patient Education  2017 [employer].   Suprapubic Catheter Replacement, Care After Refer to this sheet in the next few weeks. These instructions provide you with information about caring for yourself after your procedure. Your health care provider may also give you more specific instructions. Your treatment has been planned according to current medical practices, but problems sometimes occur. Call your health care provider if you have any problems or questions after your procedure. What can I expect after the procedure? After your procedure, it is possible to have some discomfort around the opening in your abdomen. Follow these instructions at home: Caring for your skin around the catheter  Use a clean washcloth and soapy water to clean the skin around your catheter every day. Pat the  area dry with a clean towel.  Do not pull on the catheter.  Do not use ointment or lotion on this area unless told by your health care provider.  Check your skin around the catheter every day for signs of infection. Check for:  Redness, swelling, or pain.  Fluid or blood.  Warmth.  Pus or a bad smell. Caring for the catheter tube   Clean the catheter tube with soap and water as often as told by your health care provider.  Always make sure there are no twists or curls (kinks) in the catheter tube. Emptying the collection bag  Empty the large collection bag every 8 hours. Empty the small collection bag when it is about ? full. To empty your large or small collection bag, take the following steps:  Always keep the bag below the level of the  catheter. This keeps urine from flowing backwards into the catheter.  Hold the bag over the toilet or another container. Turn the valve (spigot) at the bottom of the bag to empty the urine.  Do not touch the opening of the spigot.  Do not let the opening touch the toilet or container.  Close the spigot tightly when the bag is empty. Cleaning the collection bag   Clean the collection bag every 2-3 days, or as often as told by your health care provider. To do this, take the following steps:  Wash your hands with soap and water. If soap and water are not available, use hand sanitizer.  Disconnect the bag from the catheter and immediately attach a new bag to the catheter.  Empty the used bag completely.  Clean the used bag using one of the following methods:  Rinse the bag with warm water and soap.  Fill the bag with water and add 1 tsp of vinegar. Let it sit for about 30 minutes, then empty the bag.  Let the bag dry completely, and put it in a clean plastic bag before storing it. General instructions   Always wash your hands before and after caring for your catheter and collection bag. Use a mild, fragrance-free soap. If soap and water are not available, use hand sanitizer.  Always make sure there are no leaks in the catheter or collection bag.  Drink enough fluid to keep your urine clear or pale yellow.  If you were prescribed an antibiotic medicine, take it as told by your health care provider. Do not stop taking the antibiotic even if you start to feel better.  Do not take baths, swim, or use a hot tub.  Keep all follow-up appointments as told by your health care provider. This is important. Contact a health care provider if:  You leak urine.  You have redness, swelling, or pain around your catheter opening.  You have fluid or blood coming from your catheter opening.  Your catheter opening feels warm to the touch.  You have pus or a bad smell coming from your catheter  opening.  You have a fever or chills.  Your urine flow slows down.  Your urine becomes cloudy or smelly. Get help right away if:  Your catheter comes out.  You feel nauseous.  You have back pain.  You have difficulty changing your catheter.  You have blood in your urine.  You have no urine flow for 1 hour. This information is not intended to replace advice given to you by your health care provider. Make sure you discuss any questions you have with your  health care provider. Document Released: 02/15/2011 Document Revised: 01/27/2016 Document Reviewed: 02/10/2015 Elsevier Interactive Patient Education  2017 [employer].

## 2016-10-06 NOTE — Procedures (Signed)
Interventional Radiology Procedure Note  Procedure:  1.) Exchange and upsize of suprapubic catheter to 14F. 2.) Bilateral PCN exchange, 14F left and 62Y right  Complications: None  Estimated Blood Loss: None  Recommendations: - Next exchange in 8 weeks  Signed,  Criselda Peaches, MD

## 2016-10-06 NOTE — H&P (Signed)
Chief Complaint: Patient was seen in consultation today for exchnage of bilateral nephrostomy tubes and upsizing of SP catheter at the request of [place]  Referring Physician(s): [place]  Supervising Physician: Jacqulynn Cadet  Patient Status: [facility] - Out-pt  History of Present Illness: [name] is a 56 y.o. female with multiple sclerosis and bilateral nephrostomy tubes. There was also previous concern for a bladder perforation. Cystoscopy demonstrated bladder dome inflammation and urology requested placement of a suprapubic catheter. This was placed on 07/27/16 She is referred for routine exchange of her bilateral PCN ans well as the SP tube. IR is asked to upsize the SP tube, therefore, the pt is scheduled for these procedures with sedation. PMHx, meds, labs, pervious imaging reviewed. She has been NPO all day.  Past Medical History:  Diagnosis Date  . Acute parametritis and pelvic cellulitis   . Acute renal failure superimposed on stage 3 chronic kidney disease ([place]) 07/21/2015  . Anemia    chronic   . Contracture, right hand   . Diabetes mellitus without complication ([place])   . Diabetic neuropathy ([place])   . DVT (deep venous thrombosis) ([place])   . DVT (deep venous thrombosis) ([place])   . GERD (gastroesophageal reflux disease)   . History of kidney stones   . Hx of sepsis   . Hyperlipidemia   . Hypertension   . Hypokalemia   . Hypomagnesemia   . Left nephrolithiasis   . Leukocytosis   . Lymph edema    chronic   . Malnutrition of moderate degree 07/21/2015  . Multiple sclerosis diagnosed 2002-not on Therapy any longer 06/26/2012  . Muscle weakness (generalized)   . Neuromuscular disorder ([place])   . Neuromuscular dysfunction of bladder   . Polyneuropathy   . Presence of indwelling urinary catheter   . Protein calorie malnutrition ([place])   . Stage 4 decubitus ulcer ([place]) 07/05/2015  . Stroke ([place])   . UTI (lower urinary tract infection)     Past  Surgical History:  Procedure Laterality Date  . COLONOSCOPY WITH PROPOFOL N/A 07/25/2016   Procedure: COLONOSCOPY WITH PROPOFOL;  Surgeon: Laurence Spates, MD;  [facility];  Service: Endoscopy;  Laterality: N/A;  . ESOPHAGOGASTRODUODENOSCOPY Left 01/02/2015   Procedure: ESOPHAGOGASTRODUODENOSCOPY (EGD);  Surgeon: Arta Silence, MD;  Location: [facility];  Service: Endoscopy;  Laterality: Left;  . ESOPHAGOGASTRODUODENOSCOPY (EGD) WITH PROPOFOL N/A 07/25/2016   Procedure: ESOPHAGOGASTRODUODENOSCOPY (EGD) WITH PROPOFOL;  Surgeon: Laurence Spates, MD;  [facility];  Service: Endoscopy;  Laterality: N/A;  . HERNIA MESH REMOVAL    . HOLMIUM LASER APPLICATION Left 08/13/2949   Procedure: HOLMIUM LASER APPLICATION;  Surgeon: Franchot Gallo, MD;  [facility];  Service: Urology;  Laterality: Left;  . IR GENERIC HISTORICAL  02/23/2016   IR NEPHROSTOMY EXCHANGE LEFT 02/23/2016 Sandi Mariscal, MD [facility]  . IR GENERIC HISTORICAL  04/19/2016   IR NEPHROSTOMY EXCHANGE LEFT 04/19/2016 Arne Cleveland, MD [facility]  . IR GENERIC HISTORICAL  05/13/2016   IR NEPHROSTOMY EXCHANGE LEFT 05/13/2016 Corrie Mckusick, DO [facility]  . IR GENERIC HISTORICAL  05/13/2016   IR NEPHROSTOMY PLACEMENT RIGHT 05/13/2016 Corrie Mckusick, DO [facility]  . IR GENERIC HISTORICAL  07/07/2016   IR NEPHROSTOMY EXCHANGE RIGHT 07/07/2016 Jacqulynn Cadet, MD [facility]  . IR GENERIC HISTORICAL  07/07/2016   IR NEPHROSTOMY EXCHANGE LEFT 07/07/2016 Jacqulynn Cadet, MD [facility]  . NEPHROLITHOTOMY Left 08/18/2016   Procedure: NEPHROLITHOTOMY LEFT PERCUTANEOUS;  Surgeon: Franchot Gallo, MD;  Location: [facility];  Service: Urology;  Laterality: Left;  . NEPHROSTOMY TUBE PLACEMENT ([place] HX)    . TRANSURETHRAL RESECTION OF BLADDER TUMOR N/A 03/16/2016   Procedure: CYSTOSCOPY BLADDER BIOPSY;  Surgeon: Franchot Gallo, MD;  [facility];  Service: Urology;  Laterality: N/A;  . TUBAL LIGATION    . tubes tided        Allergies: Patient has no known allergies.  Medications: Prior to Admission medications   Medication Sig Start Date End Date Taking? Authorizing Provider  acetaminophen (TYLENOL) 325 MG tablet Take 2 tablets (650 mg total) by mouth every 6 (six) hours as needed for fever. 03/20/16  Yes Bonnielee Haff, MD  atorvastatin (LIPITOR) 10 MG tablet Take 10 mg by mouth at bedtime.    Yes Historical Provider, MD  famotidine (PEPCID) 20 MG tablet Take 1 tablet (20 mg total) by mouth 2 (two) times daily. 11/18/15  Yes Cherene Altes, MD  gabapentin (NEURONTIN) 100 MG capsule Take 1 capsule (100 mg total) by mouth at bedtime. 11/18/15  Yes Cherene Altes, MD  glipiZIDE (GLUCOTROL) 2.5 mg TABS tablet Take 2.5 mg by mouth daily before breakfast.   Yes Historical Provider, MD  HYDROcodone-homatropine (HYCODAN) 5-1.5 MG/5ML syrup Take 5 mLs by mouth every 6 (six) hours as needed for cough. 07/28/16  Yes Fielding, DO  lisinopril (PRINIVIL,ZESTRIL) 2.5 MG tablet Take 2.5 mg by mouth daily.  11/30/15  Yes Historical Provider, MD  Magnesium 400 MG TABS Take 400 mg by mouth daily.   Yes Historical Provider, MD  metFORMIN (GLUCOPHAGE) 500 MG tablet Take 0.5 tablets (250 mg total) by mouth 2 (two) times daily. 04/22/16  Yes Dinah C Ngetich, NP  Multiple Vitamin (MULTIVITAMIN WITH MINERALS) TABS tablet Take 1 tablet by mouth daily. 07/09/15  Yes Florencia Reasons, MD  nystatin cream (MYCOSTATIN) Apply 1 application topically 2 (two) times daily. Apply to groin and buttock twice daily for fungal rash   Yes Historical Provider, MD  polyethylene glycol (MIRALAX / GLYCOLAX) packet Take 17 g by mouth 2 (two) times daily. 11/18/15  Yes Cherene Altes, MD  potassium chloride (K-DUR,KLOR-CON) 10 MEQ tablet Take 1 tablet (10 mEq total) by mouth daily. 03/21/16  Yes Bonnielee Haff, MD  albuterol (PROVENTIL) (2.5 MG/3ML) 0.083% nebulizer solution Take 3 mLs (2.5 mg total) by nebulization every 6 (six) hours as needed for  wheezing or shortness of breath. 12/30/15   Robbie Lis, MD     Family History  Problem Relation Age of Onset  . Diabetes Mother   . Alzheimer's disease Mother   . Diabetes Father   . Diabetes Sister   . Scoliosis Sister     Social History   Social History  . Marital status: Single    Spouse name: N/A  . Number of children: N/A  . Years of education: N/A   Social History Main Topics  . Smoking status: Former Smoker    Packs/day: 1.00    Years: 20.00    Types: Cigarettes    Quit date: 11/19/2014  . Smokeless tobacco: Never Used  . Alcohol use 3.0 oz/week    5 Glasses of wine per week  . Drug use: No  . Sexual activity: Not Currently   Other Topics Concern  . 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  Review of Systems: A 12 point ROS discussed and pertinent positives are indicated in the HPI above.  All other systems are negative.  Review of Systems  Vital Signs: BP (!) 85/48   Pulse 90   Temp 99.7 F (37.6 C) (Oral)   Resp 20   SpO2 100%   Physical Exam  Constitutional: She is oriented to person, place, and time. She appears well-developed. No distress.  HENT:  Head: Normocephalic.  Mouth/Throat: Oropharynx is clear and moist.  Neck: Normal range of motion. No tracheal deviation present.  Cardiovascular: Normal rate, regular rhythm and normal heart sounds.   Pulmonary/Chest: Effort normal and breath sounds normal. No respiratory distress.  Abdominal:  (B)PCN and SP tubes intact, sites clean. Cloudy UOP from each  Neurological: She is alert and oriented to person, place, and time.  Psychiatric: She has a normal mood and affect.    Mallampati Score:  MD Evaluation Airway: WNL Heart: WNL Abdomen: WNL Chest/ Lungs: WNL ASA  Classification: 3 Mallampati/Airway Score: Two  Imaging: No results  found.  Labs:  CBC:  Recent Labs  07/27/16 0509 07/28/16 0435 08/05/16 08/15/16 2302 08/19/16 0431  WBC 8.9 9.4 11.5 11.5*  --   HGB 7.6* 7.8* 8.7* 9.3* 8.9*  HCT 23.5* 24.3* 28* 29.5* 27.3*  PLT 389 377 497* 355  --     COAGS:  Recent Labs  03/08/16 2119 03/09/16 0720 03/09/16 1401 03/10/16 0740  05/13/16 1036 07/12/16 1239 07/15/16 0845 07/26/16 0519  INR 1.68  --   --   --   < > 1.18 5.27* 1.16 1.15  APTT 45* 47* 51* 39*  --   --   --   --   --   < > = values in this interval not displayed.  BMP:  Recent Labs  07/26/16 0519 07/27/16 0509 07/28/16 0435 08/05/16 08/15/16 2302 08/16/16 0420 08/17/16 0431 08/18/16 0452 08/19/16 0825  NA 143 142 145 141 137  --   --   --   --   K 3.2* 3.2* 3.3* 4.0 4.1  --   --   --   --   CL 113* 113* 117*  --  107  --   --   --   --   CO2 26 25 23   --  25  --   --   --   --   GLUCOSE 106* 105* 106*  --  105*  --   --   --   --   BUN 8 7 5* 14 23*  --   --   --   --   CALCIUM 9.4 9.4 9.5  --  10.6*  --   --   --   --   CREATININE 0.65 0.71 0.60 0.6 0.74 0.65 0.69 0.84 0.91  GFRNONAA >60 >60 >60  --  >60 >60 >60 >60 >60  GFRAA >60 >60 >60  --  >60 >60 >60 >60 >60    LIVER FUNCTION TESTS:  Recent Labs  07/12/16 1239 07/16/16 0749 07/28/16 0435 08/15/16 2302  BILITOT 1.3* 0.6 0.9 0.4  AST 32 30 11* 19  ALT 11* 47 14 13*  ALKPHOS 41 136* 58 61  PROT 5.2* 6.0* 7.1 8.1  ALBUMIN 2.0* 1.9* 2.4* 3.4*    TUMOR MARKERS: No results for input(s): AFPTM, CEA, CA199, CHROMGRNA in the last 8760 hours.  Assessment and Plan: Multiple sclerosis with hx of kidney stones requiring bilateral PCN as well as SP tube for urinary diversion/decompression.  She is here for PCN exchange and exchange/upsize of SP tube with sedation. No labs needed. Risks and Benefits discussed with the patient including, but not limited to infection, bleeding. All of the patient's questions were answered, patient is agreeable to proceed. Consent  signed and in chart.    Thank you for this interesting consult.  I greatly enjoyed meeting LOANN CHAHAL and look forward to participating in their care.  A copy of this report was sent to the requesting provider on this date.  Electronically Signed: Ascencion Dike 10/06/2016, 1:19 PM   I spent a total of 25 minutes in face to face in clinical consultation, greater than 50% of which was counseling/coordinating care for PCN and SP tube exchanges

## 2016-10-06 NOTE — Progress Notes (Signed)
Spoke with Stanton Kidney at [facility] in regards to clarifying and rechecking pts medications and when last adminstered

## 2016-10-10 LAB — HM DIABETES FOOT EXAM

## 2016-10-13 DIAGNOSIS — I70203 Unspecified atherosclerosis of native arteries of extremities, bilateral legs: Secondary | ICD-10-CM | POA: Diagnosis not present

## 2016-10-13 DIAGNOSIS — B351 Tinea unguium: Secondary | ICD-10-CM | POA: Diagnosis not present

## 2016-10-13 DIAGNOSIS — E1142 Type 2 diabetes mellitus with diabetic polyneuropathy: Secondary | ICD-10-CM | POA: Diagnosis not present

## 2016-10-13 DIAGNOSIS — M79674 Pain in right toe(s): Secondary | ICD-10-CM | POA: Diagnosis not present

## 2016-10-17 ENCOUNTER — Encounter: Payer: Self-pay | Admitting: Family

## 2016-10-17 ENCOUNTER — Non-Acute Institutional Stay (SKILLED_NURSING_FACILITY): Payer: Medicare Other | Admitting: Family

## 2016-10-17 DIAGNOSIS — G35 Multiple sclerosis: Secondary | ICD-10-CM

## 2016-10-17 DIAGNOSIS — N319 Neuromuscular dysfunction of bladder, unspecified: Secondary | ICD-10-CM

## 2016-10-17 DIAGNOSIS — E118 Type 2 diabetes mellitus with unspecified complications: Secondary | ICD-10-CM

## 2016-10-17 DIAGNOSIS — I10 Essential (primary) hypertension: Secondary | ICD-10-CM

## 2016-10-17 DIAGNOSIS — L89323 Pressure ulcer of left buttock, stage 3: Secondary | ICD-10-CM | POA: Diagnosis not present

## 2016-10-17 NOTE — Progress Notes (Signed)
Location:  [facility] Room Number: 502A Place of Service:  SNF (31) Provider:  Melquisedec Journey FNP-C  Blanchie Serve, MD  Patient Care Team: Blanchie Serve, MD as PCP - General (Internal Medicine) Franchot Gallo, MD as Consulting Physician (Urology)  Extended Emergency Contact Information Primary Emergency Contact: [facility] Address: [address]          [place], [place] [address] Johnnette Litter of [place] Phone: [phone] Mobile Phone: [phone] Relation: Relative Secondary Emergency Contact: Hughes,Michael Address: [address]          [place], [place] [address] Johnnette Litter of [place] Phone: [phone] Mobile Phone: [phone] Relation: Relative  Code Status:  Full code Goals of care: Advanced Directive information Advanced Directives 10/06/2016  Does Patient Have a Medical Advance Directive? No  Type of Advance Directive -  Does patient want to make changes to medical advance directive? -  Copy of [place] in Chart? No - copy requested  Would patient like information on creating a medical advance directive? No - Patient declined  Pre-existing out of facility DNR order (yellow form or pink MOST form) -     Chief Complaint  Patient presents with  . Medical Management of Chronic Issues    Routine visit     HPI:  Pt is a 56 y.o. female seen today at [facility] for an acute visit for evaluation of abnormal lab results. She has a significant medical history of Type 2 DM,MS, Hydronephrosis with Nephrostomy tube, CKD stage 3, Neurogenic Bladder, dyslipidemia among other conditions.She seen in her room today with her cousin at the bedside. She denies any acute issues this visit. Her bilateral nephrostomy dressing managed by facility wound care Nurse. She has upcoming appointment with dahlstedt.Facility Nurse reports no new concerns.   Past Medical History:  Diagnosis  Date  . Acute parametritis and pelvic cellulitis   . Acute renal failure superimposed on stage 3 chronic kidney disease ([place]) 07/21/2015  . Anemia    chronic   . Contracture, right hand   . Diabetes mellitus without complication ([place])   . Diabetic neuropathy ([place])   . DVT (deep venous thrombosis) ([place])   . DVT (deep venous thrombosis) ([place])   . GERD (gastroesophageal reflux disease)   . History of kidney stones   . Hx of sepsis   . Hyperlipidemia   . Hypertension   . Hypokalemia   . Hypomagnesemia   . Left nephrolithiasis   . Leukocytosis   . Lymph edema    chronic   . Malnutrition of moderate degree 07/21/2015  . Multiple sclerosis diagnosed 2002-not on Therapy any longer 06/26/2012  . Muscle weakness (generalized)   . Neuromuscular disorder ([place])   . Neuromuscular dysfunction of bladder   . Polyneuropathy   . Presence of indwelling urinary catheter   . Protein calorie malnutrition ([place])   . Stage 4 decubitus ulcer ([place]) 07/05/2015  . Stroke ([place])   . UTI (lower urinary tract infection)    Past Surgical History:  Procedure Laterality Date  . COLONOSCOPY WITH PROPOFOL N/A 07/25/2016   Procedure: COLONOSCOPY WITH PROPOFOL;  Surgeon: Laurence Spates, MD;  [facility];  Service: Endoscopy;  Laterality: N/A;  . ESOPHAGOGASTRODUODENOSCOPY Left 01/02/2015   Procedure: ESOPHAGOGASTRODUODENOSCOPY (EGD);  Surgeon: Arta Silence, MD;  Location: [facility];  Service: Endoscopy;  Laterality: Left;  . ESOPHAGOGASTRODUODENOSCOPY (EGD) WITH PROPOFOL N/A 07/25/2016   Procedure: ESOPHAGOGASTRODUODENOSCOPY (EGD) WITH PROPOFOL;  Surgeon: Laurence Spates, MD;  Location: [facility];  Service: Endoscopy;  Laterality: N/A;  . HERNIA MESH REMOVAL    . HOLMIUM LASER APPLICATION Left 08/18/9022   Procedure: HOLMIUM LASER APPLICATION;  Surgeon: Franchot Gallo, MD;  [facility];  Service: Urology;  Laterality: Left;  . IR CATHETER TUBE CHANGE  10/06/2016  . IR GENERIC HISTORICAL  02/23/2016     IR NEPHROSTOMY EXCHANGE LEFT 02/23/2016 Sandi Mariscal, MD [facility]  . IR GENERIC HISTORICAL  04/19/2016   IR NEPHROSTOMY EXCHANGE LEFT 04/19/2016 Arne Cleveland, MD [facility]  . IR GENERIC HISTORICAL  05/13/2016   IR NEPHROSTOMY EXCHANGE LEFT 05/13/2016 Corrie Mckusick, DO [facility]  . IR GENERIC HISTORICAL  05/13/2016   IR NEPHROSTOMY PLACEMENT RIGHT 05/13/2016 Corrie Mckusick, DO [facility]  . IR GENERIC HISTORICAL  07/07/2016   IR NEPHROSTOMY EXCHANGE RIGHT 07/07/2016 Jacqulynn Cadet, MD [facility]  . IR GENERIC HISTORICAL  07/07/2016   IR NEPHROSTOMY EXCHANGE LEFT 07/07/2016 Jacqulynn Cadet, MD [facility]  . IR NEPHROSTOMY EXCHANGE LEFT  10/06/2016  . IR NEPHROSTOMY EXCHANGE RIGHT  10/06/2016  . NEPHROLITHOTOMY Left 08/18/2016   Procedure: NEPHROLITHOTOMY LEFT PERCUTANEOUS;  Surgeon: Franchot Gallo, MD;  [facility];  Service: Urology;  Laterality: Left;  . NEPHROSTOMY TUBE PLACEMENT ([place] HX)    . TRANSURETHRAL RESECTION OF BLADDER TUMOR N/A 03/16/2016   Procedure: CYSTOSCOPY BLADDER BIOPSY;  Surgeon: Franchot Gallo, MD;  [facility];  Service: Urology;  Laterality: N/A;  . TUBAL LIGATION    . tubes tided      No Known Allergies  Allergies as of 10/17/2016   No Known Allergies     Medication List       Accurate as of 10/17/16  6:51 PM. Always use your most recent med list.          acetaminophen 325 MG tablet Commonly known as:  TYLENOL Take 2 tablets (650 mg total) by mouth every 6 (six) hours as needed for fever.   albuterol (2.5 MG/3ML) 0.083% nebulizer solution Commonly known as:  PROVENTIL Take 3 mLs (2.5 mg total) by nebulization every 6 (six) hours as needed for wheezing or shortness of breath.   atorvastatin 10 MG tablet Commonly known as:  LIPITOR Take 10 mg by mouth at bedtime.   famotidine 20 MG tablet Commonly known as:  PEPCID Take 1 tablet (20 mg total) by mouth 2 (two) times daily.   gabapentin 100 MG capsule Commonly known as:   NEURONTIN Take 1 capsule (100 mg total) by mouth at bedtime.   glipiZIDE 2.5 mg Tabs tablet Commonly known as:  GLUCOTROL Take 2.5 mg by mouth daily before breakfast.   HYDROcodone-homatropine 5-1.5 MG/5ML syrup Commonly known as:  HYCODAN Take 5 mLs by mouth every 6 (six) hours as needed for cough.   lisinopril 2.5 MG tablet Commonly known as:  PRINIVIL,ZESTRIL Take 2.5 mg by mouth daily.   Magnesium 400 MG Tabs Take 400 mg by mouth daily.   metFORMIN 500 MG tablet Commonly known as:  GLUCOPHAGE Take 0.5 tablets (250 mg total) by mouth 2 (two) times daily.   multivitamin with minerals Tabs tablet Take 1 tablet by mouth daily.   nystatin cream Commonly known as:  MYCOSTATIN Apply 1 application topically 2 (two) times daily. Apply to groin and buttock twice daily for fungal rash   polyethylene glycol packet Commonly known as:  MIRALAX / GLYCOLAX Take 17 g by mouth 2 (two) times daily.   potassium chloride 10  MEQ tablet Commonly known as:  K-DUR,KLOR-CON Take 1 tablet (10 mEq total) by mouth daily.       Review of Systems  Constitutional: Negative for activity change, appetite change, chills, fatigue and fever.  HENT: Negative for congestion, rhinorrhea, sinus pressure, sneezing and sore throat.   Eyes: Negative.   Respiratory: Negative for cough, chest tightness, shortness of breath and wheezing.   Cardiovascular: Negative for chest pain, palpitations and leg swelling.  Gastrointestinal: Negative for abdominal distention, abdominal pain, constipation, diarrhea, nausea and vomiting.  Endocrine: Negative.   Genitourinary: Negative for flank pain and urgency.       Supra pubic Foley Catheter,Left and right Nephrostomy   Musculoskeletal: Positive for gait problem.  Skin: Negative for color change, pallor, rash and wound.  Neurological: Negative for dizziness, seizures, syncope, light-headedness, numbness and headaches.  Hematological: Does not bruise/bleed easily.    Psychiatric/Behavioral: Negative for agitation, confusion, hallucinations and sleep disturbance. The patient is not nervous/anxious.     Immunization History  Administered Date(s) Administered  . Influenza Split 06/26/2012  . Influenza-Unspecified 04/02/2015  . PPD Test 01/04/2015, 07/09/2015, 07/23/2015, 09/02/2015, 12/31/2015, 07/28/2016, 08/19/2016  . Pneumococcal-Unspecified 03/13/2014   Pertinent  Health Maintenance Due  Topic Date Due  . OPHTHALMOLOGY EXAM  10/19/1968  . PAP SMEAR  10/20/1979  . MAMMOGRAM  09/19/2015  . INFLUENZA VACCINE  01/11/2017  . HEMOGLOBIN A1C  01/12/2017  . FOOT EXAM  10/10/2017  . COLONOSCOPY  07/25/2026      Vitals:   10/17/16 1429  BP: 113/68  Pulse: 89  Resp: 18  Temp: (!) 96.4 F (35.8 C)  SpO2: 97%  Weight: 143 lb 12.8 oz (65.2 kg)  Height: 5\' 6"  (1.676 m)   Body mass index is 23.21 kg/m. Physical Exam  Constitutional: She is oriented to person, place, and time. She appears well-developed and well-nourished. No distress.  HENT:  Head: Normocephalic.  Mouth/Throat: Oropharynx is clear and moist. No oropharyngeal exudate.  Eyes: Conjunctivae and EOM are normal. Pupils are equal, round, and reactive to light. Right eye exhibits no discharge. Left eye exhibits no discharge. No scleral icterus.  Neck: Normal range of motion. No JVD present. No thyromegaly present.  Cardiovascular: Normal rate, regular rhythm, normal heart sounds and intact distal pulses.  Exam reveals no gallop and no friction rub.   No murmur heard. Pulmonary/Chest: Effort normal and breath sounds normal. No respiratory distress. She has no wheezes. She has no rales.  Abdominal: Soft. Bowel sounds are normal. She exhibits no distension. There is no rebound.  Genitourinary:  Genitourinary Comments: Supra pubic foley cathter and bilateral Nephrostomy draining clear yellow urine.    Musculoskeletal: She exhibits no edema, tenderness or deformity.  Wheelchair bound. RUE  /Bilateral LE's weakness.  Lymphadenopathy:    She has no cervical adenopathy.  Neurological: She is oriented to person, place, and time.  Skin: Skin is warm and dry. No rash noted. No erythema. No pallor.  Bilateral nephrostomy and suprapubic dressing site dry, clean and intact.   Psychiatric: She has a normal mood and affect.    Labs reviewed:  Recent Labs  05/08/16 1800  07/15/16 0500  07/27/16 0509 07/28/16 0435 08/05/16 08/11/16 08/15/16 2302  08/18/16 0452 08/19/16 0825 08/24/16  NA  --   < > 140  < > 142 145 141  --  137  --   --   --  138  K  --   < > 3.9  < > 3.2* 3.3* 4.0  --  4.1  --   --   --  5.2  CL  --   < > 113*  < > 113* 117*  --   --  107  --   --   --   --   CO2  --   < > 21*  < > 25 23  --   --  25  --   --   --   --   GLUCOSE  --   < > 92  < > 105* 106*  --   --  105*  --   --   --   --   BUN  --   < > 14  < > 7 5* 14  --  23*  --   --   --  18  CREATININE  --   < > 0.88  < > 0.71 0.60 0.6  --  0.74  < > 0.84 0.91 0.9  CALCIUM  --   < > 9.3  < > 9.4 9.5  --   --  10.6*  --   --   --   --   MG 2.0  --  1.7  --   --  1.4*  --  1.4  1.4  --   --   --   --   --   PHOS 2.2*  --  2.6  --   --  2.5  --   --   --   --   --   --   --   < > = values in this interval not displayed.  Recent Labs  07/16/16 0749 07/28/16 0435 08/15/16 2302 08/24/16  AST 30 11* 19 12*  ALT 47 14 13* 17  ALKPHOS 136* 58 61 70  BILITOT 0.6 0.9 0.4  --   PROT 6.0* 7.1 8.1  --   ALBUMIN 1.9* 2.4* 3.4*  --     Recent Labs  07/20/16 0414  07/27/16 0509 07/28/16 0435 08/05/16 08/15/16 2302 08/19/16 0431 08/24/16  WBC 10.6*  < > 8.9 9.4 11.5 11.5*  --  10.0  NEUTROABS 6.9  --   --  5.2  --  5.6  --   --   HGB 7.9*  < > 7.6* 7.8* 8.7* 9.3* 8.9* 9.5*  HCT 25.2*  < > 23.5* 24.3* 28* 29.5* 27.3* 30*  MCV 87.8  < > 87.7 85.6  --  88.9  --   --   PLT 447*  < > 389 377 497* 355  --  387  < > = values in this interval not displayed. Lab Results  Component Value Date   TSH 2.33  09/27/2016   Lab Results  Component Value Date   HGBA1C 5.3 07/15/2016   Lab Results  Component Value Date   CHOL 179 09/27/2016   HDL 56 09/27/2016   LDLCALC 101 09/27/2016   TRIG 112 09/27/2016   Assessment/Plan 1. HTN B/p stable. Continue on lisinopril 2.5 mg Tablet. Monitor BMP.   2. MS  Progressive decline expected. No further treatment. Continue to assist with ADL's.   3. Neurogenic bladder Has suprapubic catheter and bilateral nephrostomy. Draining clerar yellow urine. Encourage hydration. Continue to follow up with Urology.   4. Type 2 DM Lab Results  Component Value Date   HGBA1C 5.3 07/15/2016   CBG's ranging in 80's-200's. Continue on Glipizide 2.5 mg Tablet and metformin 250 mg tablet twice daily.On atorvastatin and ACE inhibitor.Upto date with annual foot  exam. Awaiting Opthalmology evaluation for annual eye exam.   5. Left ischium  pressure ulcer  Wound bed beefy red in color without any drainage noted. Surrounding skin tissues without any signs of infections. Continue wound care.   Family/ staff Communication: Reviewed plan of care with patient and facility Nurse supervisor.   Labs/tests ordered: None

## 2016-10-28 ENCOUNTER — Encounter: Payer: Self-pay | Admitting: Family

## 2016-10-28 ENCOUNTER — Non-Acute Institutional Stay (SKILLED_NURSING_FACILITY): Payer: Medicare Other | Admitting: Family

## 2016-10-28 DIAGNOSIS — E118 Type 2 diabetes mellitus with unspecified complications: Secondary | ICD-10-CM

## 2016-10-28 DIAGNOSIS — B3789 Other sites of candidiasis: Secondary | ICD-10-CM

## 2016-10-28 DIAGNOSIS — L89322 Pressure ulcer of left buttock, stage 2: Secondary | ICD-10-CM | POA: Diagnosis not present

## 2016-10-28 NOTE — Progress Notes (Signed)
Location:  [facility] Room Number: 502A Place of Service:  SNF (31) Provider:  Maryem Shuffler FNP-C  Blanchie Serve, MD  Patient Care Team: Blanchie Serve, MD as PCP - General (Internal Medicine) Franchot Gallo, MD as Consulting Physician (Urology)  Extended Emergency Contact Information Primary Emergency Contact: [facility] Address: [address]          [place], [place] [address] Johnnette Litter of [place] Phone: [phone] Mobile Phone: [phone] Relation: Relative Secondary Emergency Contact: Hughes,Michael Address: [address]          [place], [place] [address] Johnnette Litter of [place] Phone: [phone] Mobile Phone: [phone] Relation: Relative  Code Status:  Full code Goals of care: Advanced Directive information Advanced Directives 10/28/2016  Does Patient Have a Medical Advance Directive? No  Type of Advance Directive -  Does patient want to make changes to medical advance directive? -  Copy of [place] in Chart? -  Would patient like information on creating a medical advance directive? -  Pre-existing out of facility DNR order (yellow form or pink MOST form) -     Chief Complaint  Patient presents with  . Acute Visit    Determine need for continuing nystatin    HPI:  Pt is a 56 y.o. female seen [facility] at [facility] for an acute visit for follow up rash.She has a medical history of Type 2 DM,MS, Hydronephrosis with Nephrostomy tube, CKD stage 3, Neurogenic Bladder, dyslipidemia among other conditions.She seen in her room today watching TV. She denies any acute issues this visit. Right ischium ulcer progressive healing managed by wound care Nurse. Her CBG's log ranging in the 80's -150's with X one episode in the 200's.   Past Medical History:  Diagnosis Date  . Acute parametritis and pelvic cellulitis   . Acute renal failure superimposed on stage 3 chronic  kidney disease ([place]) 07/21/2015  . Anemia    chronic   . Contracture, right hand   . Diabetes mellitus without complication ([place])   . Diabetic neuropathy ([place])   . DVT (deep venous thrombosis) ([place])   . DVT (deep venous thrombosis) ([place])   . GERD (gastroesophageal reflux disease)   . History of kidney stones   . Hx of sepsis   . Hyperlipidemia   . Hypertension   . Hypokalemia   . Hypomagnesemia   . Left nephrolithiasis   . Leukocytosis   . Lymph edema    chronic   . Malnutrition of moderate degree 07/21/2015  . Multiple sclerosis diagnosed 2002-not on Therapy any longer 06/26/2012  . Muscle weakness (generalized)   . Neuromuscular disorder ([place])   . Neuromuscular dysfunction of bladder   . Polyneuropathy   . Presence of indwelling urinary catheter   . Protein calorie malnutrition ([place])   . Stage 4 decubitus ulcer ([place]) 07/05/2015  . Stroke ([place])   . UTI (lower urinary tract infection)    Past Surgical History:  Procedure Laterality Date  . COLONOSCOPY WITH PROPOFOL N/A 07/25/2016   Procedure: COLONOSCOPY WITH PROPOFOL;  Surgeon: Laurence Spates, MD;  [facility];  Service: Endoscopy;  Laterality: N/A;  . ESOPHAGOGASTRODUODENOSCOPY Left 01/02/2015   Procedure: ESOPHAGOGASTRODUODENOSCOPY (EGD);  Surgeon: Arta Silence, MD;  Location: [facility];  Service: Endoscopy;  Laterality: Left;  . ESOPHAGOGASTRODUODENOSCOPY (EGD) WITH PROPOFOL N/A 07/25/2016   Procedure: ESOPHAGOGASTRODUODENOSCOPY (EGD) WITH PROPOFOL;  Surgeon: Laurence Spates, MD;  [facility];  Service: Endoscopy;  Laterality: N/A;  . HERNIA MESH REMOVAL    . HOLMIUM LASER APPLICATION Left 01/16/276   Procedure: HOLMIUM LASER APPLICATION;  Surgeon: Franchot Gallo, MD;  [facility];  Service: Urology;  Laterality: Left;  . IR CATHETER TUBE CHANGE  10/06/2016  . IR GENERIC HISTORICAL  02/23/2016   IR NEPHROSTOMY EXCHANGE LEFT 02/23/2016 Sandi Mariscal, MD [facility]  . IR GENERIC HISTORICAL  04/19/2016     IR NEPHROSTOMY EXCHANGE LEFT 04/19/2016 Arne Cleveland, MD [facility]  . IR GENERIC HISTORICAL  05/13/2016   IR NEPHROSTOMY EXCHANGE LEFT 05/13/2016 Corrie Mckusick, DO [facility]  . IR GENERIC HISTORICAL  05/13/2016   IR NEPHROSTOMY PLACEMENT RIGHT 05/13/2016 Corrie Mckusick, DO [facility]  . IR GENERIC HISTORICAL  07/07/2016   IR NEPHROSTOMY EXCHANGE RIGHT 07/07/2016 Jacqulynn Cadet, MD [facility]  . IR GENERIC HISTORICAL  07/07/2016   IR NEPHROSTOMY EXCHANGE LEFT 07/07/2016 Jacqulynn Cadet, MD [facility]  . IR NEPHROSTOMY EXCHANGE LEFT  10/06/2016  . IR NEPHROSTOMY EXCHANGE RIGHT  10/06/2016  . NEPHROLITHOTOMY Left 08/18/2016   Procedure: NEPHROLITHOTOMY LEFT PERCUTANEOUS;  Surgeon: Franchot Gallo, MD;  [facility];  Service: Urology;  Laterality: Left;  . NEPHROSTOMY TUBE PLACEMENT ([place] HX)    . TRANSURETHRAL RESECTION OF BLADDER TUMOR N/A 03/16/2016   Procedure: CYSTOSCOPY BLADDER BIOPSY;  Surgeon: Franchot Gallo, MD;  [facility];  Service: Urology;  Laterality: N/A;  . TUBAL LIGATION    . tubes tided      No Known Allergies  Allergies as of 10/28/2016   No Known Allergies     Medication List       Accurate as of 10/28/16 12:34 PM. Always use your most recent med list.          acetaminophen 325 MG tablet Commonly known as:  TYLENOL Take 2 tablets (650 mg total) by mouth every 6 (six) hours as needed for fever.   albuterol (2.5 MG/3ML) 0.083% nebulizer solution Commonly known as:  PROVENTIL Take 3 mLs (2.5 mg total) by nebulization every 6 (six) hours as needed for wheezing or shortness of breath.   atorvastatin 10 MG tablet Commonly known as:  LIPITOR Take 10 mg by mouth at bedtime.   famotidine 20 MG tablet Commonly known as:  PEPCID Take 1 tablet (20 mg total) by mouth 2 (two) times daily.   gabapentin 100 MG capsule Commonly known as:  NEURONTIN Take 1 capsule (100 mg total) by mouth at bedtime.   glipiZIDE 2.5 mg Tabs tablet Commonly  known as:  GLUCOTROL Take 2.5 mg by mouth daily before breakfast.   HYDROcodone-homatropine 5-1.5 MG/5ML syrup Commonly known as:  HYCODAN Take 5 mLs by mouth every 6 (six) hours as needed for cough.   lisinopril 2.5 MG tablet Commonly known as:  PRINIVIL,ZESTRIL Take 2.5 mg by mouth daily.   Magnesium 400 MG Tabs Take 400 mg by mouth daily.   metFORMIN 500 MG tablet Commonly known as:  GLUCOPHAGE Take 0.5 tablets (250 mg total) by mouth 2 (two) times daily.   multivitamin with minerals Tabs tablet Take 1 tablet by mouth daily.   nystatin cream Commonly known as:  MYCOSTATIN Apply 1 application topically 2 (two) times daily. Apply to groin and buttock twice daily for fungal rash   polyethylene glycol packet Commonly known as:  MIRALAX / GLYCOLAX Take 17 g by mouth 2 (two) times daily.   potassium chloride 10 MEQ tablet Commonly known as:  K-DUR,KLOR-CON Take 1 tablet (10 mEq total)  by mouth daily.       Review of Systems  Constitutional: Negative for activity change, appetite change, chills, fatigue and fever.  HENT: Negative for congestion, rhinorrhea, sinus pressure, sneezing and sore throat.   Eyes: Negative.   Respiratory: Negative for cough, chest tightness, shortness of breath and wheezing.   Cardiovascular: Negative for chest pain, palpitations and leg swelling.  Gastrointestinal: Negative for abdominal distention, abdominal pain, constipation, diarrhea, nausea and vomiting.  Endocrine: Negative.   Genitourinary: Negative for flank pain and urgency.       Supra pubic Foley Catheter,Left and right Nephrostomy   Musculoskeletal: Positive for gait problem.  Skin: Negative for color change, pallor, rash and wound.  Neurological: Negative for dizziness, seizures, syncope, light-headedness, numbness and headaches.  Hematological: Does not bruise/bleed easily.  Psychiatric/Behavioral: Negative for agitation, confusion, hallucinations and sleep disturbance. The  patient is not nervous/anxious.     Immunization History  Administered Date(s) Administered  . Influenza Split 06/26/2012  . Influenza-Unspecified 04/02/2015  . PPD Test 01/04/2015, 07/09/2015, 07/23/2015, 09/02/2015, 12/31/2015, 07/28/2016, 08/19/2016  . Pneumococcal-Unspecified 03/13/2014   Pertinent  Health Maintenance Due  Topic Date Due  . OPHTHALMOLOGY EXAM  10/19/1968  . PAP SMEAR  10/20/1979  . MAMMOGRAM  09/19/2015  . INFLUENZA VACCINE  01/11/2017  . HEMOGLOBIN A1C  01/12/2017  . FOOT EXAM  10/10/2017  . COLONOSCOPY  07/25/2026      Vitals:   10/28/16 0844  BP: 129/67  Pulse: 100  Resp: 19  Temp: 98.4 F (36.9 C)  SpO2: 98%  Weight: 143 lb (64.9 kg)  Height: 5\' 6"  (1.676 m)   Body mass index is 23.08 kg/m. Physical Exam  Constitutional: She is oriented to person, place, and time. She appears well-developed and well-nourished. No distress.  HENT:  Head: Normocephalic.  Mouth/Throat: Oropharynx is clear and moist. No oropharyngeal exudate.  Eyes: Conjunctivae and EOM are normal. Pupils are equal, round, and reactive to light. Right eye exhibits no discharge. Left eye exhibits no discharge. No scleral icterus.  Neck: Normal range of motion. No JVD present. No thyromegaly present.  Cardiovascular: Normal rate, regular rhythm, normal heart sounds and intact distal pulses.  Exam reveals no gallop and no friction rub.   No murmur heard. Pulmonary/Chest: Effort normal and breath sounds normal. No respiratory distress. She has no wheezes. She has no rales.  Abdominal: Soft. Bowel sounds are normal. She exhibits no distension. There is no rebound.  Genitourinary:  Genitourinary Comments: Supra pubic foley cathter and bilateral Nephrostomy draining clear yellow urine.    Musculoskeletal: She exhibits no edema, tenderness or deformity.   RUE /Bilateral LE's weakness.  Lymphadenopathy:    She has no cervical adenopathy.  Neurological: She is oriented to person, place,  and time.  Skin: Skin is warm and dry. No rash noted. No erythema. No pallor.  # 1. Bilateral nephrostomy and suprapubic dressing site dry, clean and intact.   # 2. Right ischium ulcer beefy red in color. Old drsg with small amounts of serosanguinous drainage noted. Surrounding skin tissue without any signs of infections.   Psychiatric: She has a normal mood and affect.    Labs reviewed:  Recent Labs  05/08/16 1800  07/15/16 0500  07/27/16 0509 07/28/16 0435 08/05/16 08/11/16 08/15/16 2302  08/18/16 0452 08/19/16 0825 08/24/16  NA  --   < > 140  < > 142 145 141  --  137  --   --   --  138  K  --   < >  3.9  < > 3.2* 3.3* 4.0  --  4.1  --   --   --  5.2  CL  --   < > 113*  < > 113* 117*  --   --  107  --   --   --   --   CO2  --   < > 21*  < > 25 23  --   --  25  --   --   --   --   GLUCOSE  --   < > 92  < > 105* 106*  --   --  105*  --   --   --   --   BUN  --   < > 14  < > 7 5* 14  --  23*  --   --   --  18  CREATININE  --   < > 0.88  < > 0.71 0.60 0.6  --  0.74  < > 0.84 0.91 0.9  CALCIUM  --   < > 9.3  < > 9.4 9.5  --   --  10.6*  --   --   --   --   MG 2.0  --  1.7  --   --  1.4*  --  1.4  1.4  --   --   --   --   --   PHOS 2.2*  --  2.6  --   --  2.5  --   --   --   --   --   --   --   < > = values in this interval not displayed.  Recent Labs  07/16/16 0749 07/28/16 0435 08/15/16 2302 08/24/16  AST 30 11* 19 12*  ALT 47 14 13* 17  ALKPHOS 136* 58 61 70  BILITOT 0.6 0.9 0.4  --   PROT 6.0* 7.1 8.1  --   ALBUMIN 1.9* 2.4* 3.4*  --     Recent Labs  07/20/16 0414  07/27/16 0509 07/28/16 0435 08/05/16 08/15/16 2302 08/19/16 0431 08/24/16  WBC 10.6*  < > 8.9 9.4 11.5 11.5*  --  10.0  NEUTROABS 6.9  --   --  5.2  --  5.6  --   --   HGB 7.9*  < > 7.6* 7.8* 8.7* 9.3* 8.9* 9.5*  HCT 25.2*  < > 23.5* 24.3* 28* 29.5* 27.3* 30*  MCV 87.8  < > 87.7 85.6  --  88.9  --   --   PLT 447*  < > 389 377 497* 355  --  387  < > = values in this interval not displayed. Lab  Results  Component Value Date   TSH 2.33 09/27/2016   Lab Results  Component Value Date   HGBA1C 5.3 07/15/2016   Lab Results  Component Value Date   CHOL 179 09/27/2016   HDL 56 09/27/2016   LDLCALC 101 09/27/2016   TRIG 112 09/27/2016   Assessment/Plan  1. Type 2 DM Lab Results  Component Value Date   HGBA1C 5.3 07/15/2016   CBG's ranging in 80's-150's with x one episode 200's. Continue on Glipizide 2.5 mg Tablet and metformin 250 mg tablet twice daily.On atorvastatin and ACE inhibitor.check Hgb A1C 10/31/2016.    2. Left ischium  pressure ulcer  Wound bed beefy red in color.Old drsg with small amounts of serosanguinous drainage noted. Surrounding skin tissue without any signs of infections.continue alternating air mattress.Continue wound care.  3. candidiasis Previous groin rash resolved. Discontinue Nystatin   Family/ staff Communication: Reviewed plan of care with patient and facility Nurse supervisor.   Labs/tests ordered:  Hgb A1C 10/31/2016

## 2016-10-31 DIAGNOSIS — E0821 Diabetes mellitus due to underlying condition with diabetic nephropathy: Secondary | ICD-10-CM | POA: Diagnosis not present

## 2016-11-10 ENCOUNTER — Non-Acute Institutional Stay (SKILLED_NURSING_FACILITY): Payer: Medicare Other

## 2016-11-10 DIAGNOSIS — Z0000 Encounter for general adult medical examination without abnormal findings: Secondary | ICD-10-CM | POA: Diagnosis not present

## 2016-11-10 NOTE — Progress Notes (Signed)
Subjective:   [name] is a 56 y.o. female who presents for an Initial Medicare Annual Wellness Visit at [facility]     Objective:    Today's Vitals   11/10/16 1445  BP: 104/60  Pulse: 98  Temp: 98 F (36.7 C)  TempSrc: Oral  SpO2: 98%  Weight: 143 lb (64.9 kg)  Height: 5\' 6"  (1.676 m)   Body mass index is 23.08 kg/m.   Current Medications (verified) Outpatient Encounter Prescriptions as of 11/10/2016  Medication Sig  . acetaminophen (TYLENOL) 325 MG tablet Take 2 tablets (650 mg total) by mouth every 6 (six) hours as needed for fever.  [place] Kitchen albuterol (PROVENTIL) (2.5 MG/3ML) 0.083% nebulizer solution Take 3 mLs (2.5 mg total) by nebulization every 6 (six) hours as needed for wheezing or shortness of breath.  [place] Kitchen atorvastatin (LIPITOR) 10 MG tablet Take 10 mg by mouth at bedtime.   . famotidine (PEPCID) 20 MG tablet Take 1 tablet (20 mg total) by mouth 2 (two) times daily.  [place] Kitchen gabapentin (NEURONTIN) 100 MG capsule Take 1 capsule (100 mg total) by mouth at bedtime.  [place] Kitchen glipiZIDE (GLUCOTROL) 2.5 mg TABS tablet Take 2.5 mg by mouth daily before breakfast.  . HYDROcodone-homatropine (HYCODAN) 5-1.5 MG/5ML syrup Take 5 mLs by mouth every 6 (six) hours as needed for cough.  [place] Kitchen lisinopril (PRINIVIL,ZESTRIL) 2.5 MG tablet Take 2.5 mg by mouth daily.   . Magnesium 400 MG TABS Take 400 mg by mouth daily.  . metFORMIN (GLUCOPHAGE) 500 MG tablet Take 0.5 tablets (250 mg total) by mouth 2 (two) times daily.  . Multiple Vitamin (MULTIVITAMIN WITH MINERALS) TABS tablet Take 1 tablet by mouth daily.  . polyethylene glycol (MIRALAX / GLYCOLAX) packet Take 17 g by mouth 2 (two) times daily.  . potassium chloride (K-DUR,KLOR-CON) 10 MEQ tablet Take 1 tablet (10 mEq total) by mouth daily.   Facility-Administered Encounter Medications as of 11/10/2016  Medication  . 0.9 %  sodium chloride infusion  . enoxaparin (LOVENOX) injection 40 mg  . sodium chloride flush (NS) 0.9 %  injection 3 mL  . sodium chloride flush (NS) 0.9 % injection 3 mL    Allergies (verified) Patient has no known allergies.   History: Past Medical History:  Diagnosis Date  . Acute parametritis and pelvic cellulitis   . Acute renal failure superimposed on stage 3 chronic kidney disease ([place]) 07/21/2015  . Anemia    chronic   . Contracture, right hand   . Diabetes mellitus without complication ([place])   . Diabetic neuropathy ([place])   . DVT (deep venous thrombosis) ([place])   . DVT (deep venous thrombosis) ([place])   . GERD (gastroesophageal reflux disease)   . History of kidney stones   . Hx of sepsis   . Hyperlipidemia   . Hypertension   . Hypokalemia   . Hypomagnesemia   . Left nephrolithiasis   . Leukocytosis   . Lymph edema    chronic   . Malnutrition of moderate degree 07/21/2015  . Multiple sclerosis diagnosed 2002-not on Therapy any longer 06/26/2012  . Muscle weakness (generalized)   . Neuromuscular disorder ([place])   . Neuromuscular dysfunction of bladder   . Polyneuropathy   . Presence of indwelling urinary catheter   . Protein calorie malnutrition ([place])   . Stage 4 decubitus ulcer ([place]) 07/05/2015  . Stroke ([place])   . UTI (lower urinary tract infection)    Past Surgical History:  Procedure Laterality Date  .  COLONOSCOPY WITH PROPOFOL N/A 07/25/2016   Procedure: COLONOSCOPY WITH PROPOFOL;  Surgeon: Laurence Spates, MD;  [facility];  Service: Endoscopy;  Laterality: N/A;  . ESOPHAGOGASTRODUODENOSCOPY Left 01/02/2015   Procedure: ESOPHAGOGASTRODUODENOSCOPY (EGD);  Surgeon: Arta Silence, MD;  Location: [facility];  Service: Endoscopy;  Laterality: Left;  . ESOPHAGOGASTRODUODENOSCOPY (EGD) WITH PROPOFOL N/A 07/25/2016   Procedure: ESOPHAGOGASTRODUODENOSCOPY (EGD) WITH PROPOFOL;  Surgeon: Laurence Spates, MD;  [facility];  Service: Endoscopy;  Laterality: N/A;  . HERNIA MESH REMOVAL    . HOLMIUM LASER APPLICATION Left 01/14/4195   Procedure: HOLMIUM LASER  APPLICATION;  Surgeon: Franchot Gallo, MD;  [facility];  Service: Urology;  Laterality: Left;  . IR CATHETER TUBE CHANGE  10/06/2016  . IR GENERIC HISTORICAL  02/23/2016   IR NEPHROSTOMY EXCHANGE LEFT 02/23/2016 Sandi Mariscal, MD [facility]  . IR GENERIC HISTORICAL  04/19/2016   IR NEPHROSTOMY EXCHANGE LEFT 04/19/2016 Arne Cleveland, MD [facility]  . IR GENERIC HISTORICAL  05/13/2016   IR NEPHROSTOMY EXCHANGE LEFT 05/13/2016 Corrie Mckusick, DO [facility]  . IR GENERIC HISTORICAL  05/13/2016   IR NEPHROSTOMY PLACEMENT RIGHT 05/13/2016 Corrie Mckusick, DO [facility]  . IR GENERIC HISTORICAL  07/07/2016   IR NEPHROSTOMY EXCHANGE RIGHT 07/07/2016 Jacqulynn Cadet, MD [facility]  . IR GENERIC HISTORICAL  07/07/2016   IR NEPHROSTOMY EXCHANGE LEFT 07/07/2016 Jacqulynn Cadet, MD [facility]  . IR NEPHROSTOMY EXCHANGE LEFT  10/06/2016  . IR NEPHROSTOMY EXCHANGE RIGHT  10/06/2016  . NEPHROLITHOTOMY Left 08/18/2016   Procedure: NEPHROLITHOTOMY LEFT PERCUTANEOUS;  Surgeon: Franchot Gallo, MD;  [facility];  Service: Urology;  Laterality: Left;  . NEPHROSTOMY TUBE PLACEMENT ([place] HX)    . TRANSURETHRAL RESECTION OF BLADDER TUMOR N/A 03/16/2016   Procedure: CYSTOSCOPY BLADDER BIOPSY;  Surgeon: Franchot Gallo, MD;  [facility];  Service: Urology;  Laterality: N/A;  . TUBAL LIGATION    . tubes tided     Family History  Problem Relation Age of Onset  . Diabetes Mother   . Alzheimer's disease Mother   . Diabetes Father   . Diabetes Sister   . Scoliosis Sister    Social History   Occupational History  . Not on file.   Social History Main Topics  . Smoking status: Former Smoker    Packs/day: 1.00    Years: 20.00    Types: Cigarettes    Quit date: 11/19/2014  . Smokeless tobacco: Never Used  . Alcohol use 3.0 oz/week    5 Glasses of wine per week  . Drug use: No  . Sexual activity: Not Currently    Tobacco Counseling Counseling given: Not Answered   Activities of Daily  Living In your present state of health, do you have any difficulty performing the following activities: 11/10/2016 10/06/2016  Hearing? N N  Vision? N N  Difficulty concentrating or making decisions? N N  Walking or climbing stairs? Y Y  Dressing or bathing? Y Y  Doing errands, shopping? Y -  Conservation officer, nature and eating ? Y -  Using the Toilet? Y -  In the past six months, have you accidently leaked urine? Y -  Do you have problems with loss of bowel control? Y -  Managing your Medications? Y -  Managing your Finances? Y -  Housekeeping or managing your Housekeeping? Y -  Some recent data might be hidden    Immunizations and Health Maintenance Immunization History  Administered Date(s) Administered  . Influenza Split 06/26/2012  .  Influenza-Unspecified 04/02/2015  . PPD Test 01/04/2015, 07/09/2015, 07/23/2015, 09/02/2015, 12/31/2015, 07/28/2016, 08/19/2016  . Pneumococcal-Unspecified 03/13/2014   Health Maintenance Due  Topic Date Due  . OPHTHALMOLOGY EXAM  10/19/1968  . PAP SMEAR  10/20/1979  . MAMMOGRAM  09/19/2015    Patient Care Team: Blanchie Serve, MD as PCP - General (Internal Medicine) Franchot Gallo, MD as Consulting Physician (Urology)  Indicate any recent Medical Services you may have received from other than [facility] providers in the past year (date may be approximate).     Assessment:   This is a routine wellness examination for [name].   Hearing/Vision screen No exam data present  Dietary issues and exercise activities discussed: Current Exercise Habits: The patient does not participate in regular exercise at present, Exercise limited by: None identified  Goals    . Maintain Lifestyle          Starting today pt will maintain lifestyle.       Depression Screen PHQ 2/9 Scores 11/10/2016  PHQ - 2 Score 0    Fall Risk Fall Risk  11/10/2016  Falls in the past year? No    Cognitive Function:     6CIT Screen 11/10/2016  What Year? 0 points  What  month? 0 points  What time? 0 points  Count back from 20 0 points  Months in reverse 0 points  Repeat phrase 2 points  Total Score 2    Screening Tests Health Maintenance  Topic Date Due  . OPHTHALMOLOGY EXAM  10/19/1968  . PAP SMEAR  10/20/1979  . MAMMOGRAM  09/19/2015  . TETANUS/TDAP  08/07/2023 (Originally 10/19/1977)  . INFLUENZA VACCINE  01/11/2017  . HEMOGLOBIN A1C  01/12/2017  . FOOT EXAM  10/10/2017  . PNEUMOCOCCAL POLYSACCHARIDE VACCINE (2) 03/14/2019  . COLONOSCOPY  07/25/2026  . Hepatitis C Screening  Completed  . HIV Screening  Completed      Plan:    I have personally reviewed and addressed the Medicare Annual Wellness questionnaire and have noted the following in the patient's chart:  A. Medical and social history B. Use of alcohol, tobacco or illicit drugs  C. Current medications and supplements D. Functional ability and status E.  Nutritional status F.  Physical activity G. Advance directives H. List of other physicians I.  Hospitalizations, surgeries, and ER visits in previous 12 months J.  [place] to include hearing, vision, cognitive, depression L. Referrals and appointments - none  In addition, I have reviewed and discussed with patient certain preventive protocols, quality metrics, and best practice recommendations. A written personalized care plan for preventive services as well as general preventive health recommendations were provided to patient.  See attached scanned questionnaire for additional information.   Signed,   Rich Reining, RN Nurse Health Advisor   Quick Notes   Health Maintenance: Eye exam and DEXA due     Abnormal Screen: 6 CIT- 2     Patient Concerns: None     Nurse Concerns: None

## 2016-11-10 NOTE — Patient Instructions (Signed)
[name] , Thank you for taking time to come for your Medicare Wellness Visit. I appreciate your ongoing commitment to your health goals. Please review the following plan we discussed and let me know if I can assist you in the future.   Screening recommendations/referrals: Colonoscopy long term pt Mammogram long term pt Bone Density due Recommended yearly ophthalmology/optometry visit for glaucoma screening and checkup Recommended yearly dental visit for hygiene and checkup  Vaccinations: Influenza vaccine due 07/06/17 Pneumococcal vaccine up to date Tdap vaccine due 03/22/2025 Shingles vaccine not in records  Advanced directives: In chart  Conditions/risks identified: None  Next appointment: None upcoming  Preventive Care 40-64 Years, Female Preventive care refers to lifestyle choices and visits with your health care provider that can promote health and wellness. What does preventive care include?  A yearly physical exam. This is also called an annual well check.  Dental exams once or twice a year.  Routine eye exams. Ask your health care provider how often you should have your eyes checked.  Personal lifestyle choices, including:  Daily care of your teeth and gums.  Regular physical activity.  Eating a healthy diet.  Avoiding tobacco and drug use.  Limiting alcohol use.  Practicing safe sex.  Taking low-dose aspirin daily starting at age 83.  Taking vitamin and mineral supplements as recommended by your health care provider. What happens during an annual well check? The services and screenings done by your health care provider during your annual well check will depend on your age, overall health, lifestyle risk factors, and family history of disease. Counseling  Your health care provider may ask you questions about your:  Alcohol use.  Tobacco use.  Drug use.  Emotional well-being.  Home and relationship well-being.  Sexual activity.  Eating  habits.  Work and work Astronomer.  Method of birth control.  Menstrual cycle.  Pregnancy history. Screening  You may have the following tests or measurements:  Height, weight, and BMI.  Blood pressure.  Lipid and cholesterol levels. These may be checked every 5 years, or more frequently if you are over 86 years old.  Skin check.  Lung cancer screening. You may have this screening every year starting at age 69 if you have a 30-pack-year history of smoking and currently smoke or have quit within the past 15 years.  Fecal occult blood test (FOBT) of the stool. You may have this test every year starting at age 97.  Flexible sigmoidoscopy or colonoscopy. You may have a sigmoidoscopy every 5 years or a colonoscopy every 10 years starting at age 4.  Hepatitis C blood test.  Hepatitis B blood test.  Sexually transmitted disease (STD) testing.  Diabetes screening. This is done by checking your blood sugar (glucose) after you have not eaten for a while (fasting). You may have this done every 1-3 years.  Mammogram. This may be done every 1-2 years. Talk to your health care provider about when you should start having regular mammograms. This may depend on whether you have a family history of breast cancer.  BRCA-related cancer screening. This may be done if you have a family history of breast, ovarian, tubal, or peritoneal cancers.  Pelvic exam and Pap test. This may be done every 3 years starting at age 25. Starting at age 70, this may be done every 5 years if you have a Pap test in combination with an HPV test.  Bone density scan. This is done to screen for osteoporosis. You may have  this scan if you are at high risk for osteoporosis. Discuss your test results, treatment options, and if necessary, the need for more tests with your health care provider. Vaccines  Your health care provider may recommend certain vaccines, such as:  Influenza vaccine. This is recommended every  year.  Tetanus, diphtheria, and acellular pertussis (Tdap, Td) vaccine. You may need a Td booster every 10 years.  Zoster vaccine. You may need this after age 18.  Pneumococcal 13-valent conjugate (PCV13) vaccine. You may need this if you have certain conditions and were not previously vaccinated.  Pneumococcal polysaccharide (PPSV23) vaccine. You may need one or two doses if you smoke cigarettes or if you have certain conditions. Talk to your health care provider about which screenings and vaccines you need and how often you need them. This information is not intended to replace advice given to you by your health care provider. Make sure you discuss any questions you have with your health care provider. Document Released: 06/26/2015 Document Revised: 02/17/2016 Document Reviewed: 03/31/2015 Elsevier Interactive Patient Education  2017 [place] Prevention in the Home Falls can cause injuries. They can happen to people of all ages. There are many things you can do to make your home safe and to help prevent falls. What can I do on the outside of my home?  Regularly fix the edges of walkways and driveways and fix any cracks.  Remove anything that might make you trip as you walk through a door, such as a raised step or threshold.  Trim any bushes or trees on the path to your home.  Use bright outdoor lighting.  Clear any walking paths of anything that might make someone trip, such as rocks or tools.  Regularly check to see if handrails are loose or broken. Make sure that both sides of any steps have handrails.  Any raised decks and porches should have guardrails on the edges.  Have any leaves, snow, or ice cleared regularly.  Use sand or salt on walking paths during winter.  Clean up any spills in your garage right away. This includes oil or grease spills. What can I do in the bathroom?  Use night lights.  Install grab bars by the toilet and in the tub and shower.  Do not use towel bars as grab bars.  Use non-skid mats or decals in the tub or shower.  If you need to sit down in the shower, use a plastic, non-slip stool.  Keep the floor dry. Clean up any water that spills on the floor as soon as it happens.  Remove soap buildup in the tub or shower regularly.  Attach bath mats securely with double-sided non-slip rug tape.  Do not have throw rugs and other things on the floor that can make you trip. What can I do in the bedroom?  Use night lights.  Make sure that you have a light by your bed that is easy to reach.  Do not use any sheets or blankets that are too big for your bed. They should not hang down onto the floor.  Have a firm chair that has side arms. You can use this for support while you get dressed.  Do not have throw rugs and other things on the floor that can make you trip. What can I do in the kitchen?  Clean up any spills right away.  Avoid walking on wet floors.  Keep items that you use a lot in easy-to-reach places.  If you need to reach something above you, use a strong step stool that has a grab bar.  Keep electrical cords out of the way.  Do not use floor polish or wax that makes floors slippery. If you must use wax, use non-skid floor wax.  Do not have throw rugs and other things on the floor that can make you trip. What can I do with my stairs?  Do not leave any items on the stairs.  Make sure that there are handrails on both sides of the stairs and use them. Fix handrails that are broken or loose. Make sure that handrails are as long as the stairways.  Check any carpeting to make sure that it is firmly attached to the stairs. Fix any carpet that is loose or worn.  Avoid having throw rugs at the top or bottom of the stairs. If you do have throw rugs, attach them to the floor with carpet tape.  Make sure that you have a light switch at the top of the stairs and the bottom of the stairs. If you do not have them,  ask someone to add them for you. What else can I do to help prevent falls?  Wear shoes that:  Do not have high heels.  Have rubber bottoms.  Are comfortable and fit you well.  Are closed at the toe. Do not wear sandals.  If you use a stepladder:  Make sure that it is fully opened. Do not climb a closed stepladder.  Make sure that both sides of the stepladder are locked into place.  Ask someone to hold it for you, if possible.  Clearly mark and make sure that you can see:  Any grab bars or handrails.  First and last steps.  Where the edge of each step is.  Use tools that help you move around (mobility aids) if they are needed. These include:  Canes.  Walkers.  Scooters.  Crutches.  Turn on the lights when you go into a dark area. Replace any light bulbs as soon as they burn out.  Set up your furniture so you have a clear path. Avoid moving your furniture around.  If any of your floors are uneven, fix them.  If there are any pets around you, be aware of where they are.  Review your medicines with your doctor. Some medicines can make you feel dizzy. This can increase your chance of falling. Ask your doctor what other things that you can do to help prevent falls. This information is not intended to replace advice given to you by your health care provider. Make sure you discuss any questions you have with your health care provider. Document Released: 03/26/2009 Document Revised: 11/05/2015 Document Reviewed: 07/04/2014 Elsevier Interactive Patient Education  2017 [employer].

## 2016-11-14 DIAGNOSIS — M62469 Contracture of muscle, unspecified lower leg: Secondary | ICD-10-CM | POA: Diagnosis not present

## 2016-11-14 DIAGNOSIS — M6289 Other specified disorders of muscle: Secondary | ICD-10-CM | POA: Diagnosis not present

## 2016-11-15 DIAGNOSIS — M6289 Other specified disorders of muscle: Secondary | ICD-10-CM | POA: Diagnosis not present

## 2016-11-15 DIAGNOSIS — M62469 Contracture of muscle, unspecified lower leg: Secondary | ICD-10-CM | POA: Diagnosis not present

## 2016-11-15 DIAGNOSIS — L89322 Pressure ulcer of left buttock, stage 2: Secondary | ICD-10-CM | POA: Diagnosis not present

## 2016-11-16 DIAGNOSIS — M62469 Contracture of muscle, unspecified lower leg: Secondary | ICD-10-CM | POA: Diagnosis not present

## 2016-11-16 DIAGNOSIS — M6289 Other specified disorders of muscle: Secondary | ICD-10-CM | POA: Diagnosis not present

## 2016-11-17 ENCOUNTER — Other Ambulatory Visit: Payer: Self-pay | Admitting: Urology

## 2016-11-17 ENCOUNTER — Ambulatory Visit (HOSPITAL_COMMUNITY)
Admission: RE | Admit: 2016-11-17 | Discharge: 2016-11-17 | Disposition: A | Discharge status: Home / Self Care | Payer: Medicare Other | Source: Ambulatory Visit | Attending: Urology | Admitting: Urology

## 2016-11-17 DIAGNOSIS — N135 Crossing vessel and stricture of ureter without hydronephrosis: Secondary | ICD-10-CM

## 2016-11-17 DIAGNOSIS — Z436 Encounter for attention to other artificial openings of urinary tract: Secondary | ICD-10-CM | POA: Insufficient documentation

## 2016-11-17 DIAGNOSIS — N3289 Other specified disorders of bladder: Secondary | ICD-10-CM | POA: Insufficient documentation

## 2016-11-17 DIAGNOSIS — G35 Multiple sclerosis: Secondary | ICD-10-CM | POA: Diagnosis not present

## 2016-11-17 DIAGNOSIS — T83098A Other mechanical complication of other indwelling urethral catheter, initial encounter: Secondary | ICD-10-CM | POA: Diagnosis not present

## 2016-11-17 DIAGNOSIS — N200 Calculus of kidney: Secondary | ICD-10-CM | POA: Diagnosis not present

## 2016-11-17 HISTORY — PX: IR NEPHROSTOMY EXCHANGE LEFT: IMG6069

## 2016-11-17 HISTORY — PX: IR NEPHROSTOMY EXCHANGE RIGHT: IMG6070

## 2016-11-17 HISTORY — PX: IR CATHETER TUBE CHANGE: IMG717

## 2016-11-17 MED ORDER — LIDOCAINE HCL 1 % IJ SOLN
INTRAMUSCULAR | Status: AC
  Filled 2016-11-17: qty 20

## 2016-11-17 MED ORDER — IOPAMIDOL (ISOVUE-300) INJECTION 61%
INTRAVENOUS | Status: AC
  Administered 2016-11-17: 45 mL
  Filled 2016-11-17: qty 50

## 2016-11-18 ENCOUNTER — Other Ambulatory Visit: Payer: Self-pay | Admitting: Urology

## 2016-11-18 ENCOUNTER — Encounter (HOSPITAL_COMMUNITY): Payer: Self-pay | Admitting: Interventional Radiology

## 2016-11-18 DIAGNOSIS — M62469 Contracture of muscle, unspecified lower leg: Secondary | ICD-10-CM | POA: Diagnosis not present

## 2016-11-18 DIAGNOSIS — M6289 Other specified disorders of muscle: Secondary | ICD-10-CM | POA: Diagnosis not present

## 2016-11-18 DIAGNOSIS — N132 Hydronephrosis with renal and ureteral calculous obstruction: Secondary | ICD-10-CM

## 2016-11-21 DIAGNOSIS — M6289 Other specified disorders of muscle: Secondary | ICD-10-CM | POA: Diagnosis not present

## 2016-11-21 DIAGNOSIS — M62469 Contracture of muscle, unspecified lower leg: Secondary | ICD-10-CM | POA: Diagnosis not present

## 2016-11-22 DIAGNOSIS — M62469 Contracture of muscle, unspecified lower leg: Secondary | ICD-10-CM | POA: Diagnosis not present

## 2016-11-22 DIAGNOSIS — M6289 Other specified disorders of muscle: Secondary | ICD-10-CM | POA: Diagnosis not present

## 2016-11-23 DIAGNOSIS — M6289 Other specified disorders of muscle: Secondary | ICD-10-CM | POA: Diagnosis not present

## 2016-11-23 DIAGNOSIS — M62469 Contracture of muscle, unspecified lower leg: Secondary | ICD-10-CM | POA: Diagnosis not present

## 2016-11-24 DIAGNOSIS — M6289 Other specified disorders of muscle: Secondary | ICD-10-CM | POA: Diagnosis not present

## 2016-11-24 DIAGNOSIS — M62469 Contracture of muscle, unspecified lower leg: Secondary | ICD-10-CM | POA: Diagnosis not present

## 2016-11-25 DIAGNOSIS — M6289 Other specified disorders of muscle: Secondary | ICD-10-CM | POA: Diagnosis not present

## 2016-11-25 DIAGNOSIS — M62469 Contracture of muscle, unspecified lower leg: Secondary | ICD-10-CM | POA: Diagnosis not present

## 2016-11-28 DIAGNOSIS — M62469 Contracture of muscle, unspecified lower leg: Secondary | ICD-10-CM | POA: Diagnosis not present

## 2016-11-28 DIAGNOSIS — M6289 Other specified disorders of muscle: Secondary | ICD-10-CM | POA: Diagnosis not present

## 2016-11-30 ENCOUNTER — Other Ambulatory Visit (HOSPITAL_COMMUNITY): Payer: Medicare Other

## 2016-12-01 ENCOUNTER — Other Ambulatory Visit (HOSPITAL_COMMUNITY): Payer: Medicare Other

## 2016-12-01 DIAGNOSIS — M6289 Other specified disorders of muscle: Secondary | ICD-10-CM | POA: Diagnosis not present

## 2016-12-01 DIAGNOSIS — M62469 Contracture of muscle, unspecified lower leg: Secondary | ICD-10-CM | POA: Diagnosis not present

## 2016-12-12 DIAGNOSIS — N132 Hydronephrosis with renal and ureteral calculous obstruction: Secondary | ICD-10-CM | POA: Diagnosis not present

## 2016-12-12 DIAGNOSIS — L89223 Pressure ulcer of left hip, stage 3: Secondary | ICD-10-CM | POA: Diagnosis not present

## 2016-12-12 DIAGNOSIS — N329 Bladder disorder, unspecified: Secondary | ICD-10-CM | POA: Diagnosis not present

## 2016-12-12 DIAGNOSIS — N202 Calculus of kidney with calculus of ureter: Secondary | ICD-10-CM | POA: Diagnosis not present

## 2016-12-22 NOTE — Anesthesia Postprocedure Evaluation (Signed)
Anesthesia Post Note  Patient: [name]  Procedure(s) Performed: Procedure(s) (LRB): NEPHROLITHOTOMY LEFT PERCUTANEOUS (Left) HOLMIUM LASER APPLICATION (Left)     Anesthesia Post Evaluation  Last Vitals:  Vitals:   08/18/16 2127 08/19/16 0605  BP: 113/70   Pulse: 88 84  Resp: 16 16  Temp: 37.1 C 36.9 C    Last Pain:  Vitals:   08/19/16 0909  TempSrc:   PainSc: [place] Oluwasemilore Bahl

## 2016-12-22 NOTE — Anesthesia Postprocedure Evaluation (Signed)
Anesthesia Post Note  Patient: [name]  Procedure(s) Performed: Procedure(s) (LRB): NEPHROLITHOTOMY LEFT PERCUTANEOUS (Left) HOLMIUM LASER APPLICATION (Left)     Anesthesia Post Evaluation  Last Vitals:  Vitals:   08/18/16 2127 08/19/16 0605  BP: 113/70   Pulse: 88 84  Resp: 16 16  Temp: 37.1 C 36.9 C    Last Pain:  Vitals:   08/19/16 0909  TempSrc:   PainSc: Gonzalez Brendaly Townsel

## 2016-12-22 NOTE — Addendum Note (Signed)
Addendum  created 12/22/16 1630 by Lynda Rainwater, MD   Sign clinical note

## 2016-12-23 DIAGNOSIS — M62469 Contracture of muscle, unspecified lower leg: Secondary | ICD-10-CM | POA: Diagnosis not present

## 2016-12-23 DIAGNOSIS — M6289 Other specified disorders of muscle: Secondary | ICD-10-CM | POA: Diagnosis not present

## 2016-12-23 NOTE — Anesthesia Postprocedure Evaluation (Signed)
Anesthesia Post Note  Patient: FINESSE FIELDER  Procedure(s) Performed: Procedure(s) (LRB): ESOPHAGOGASTRODUODENOSCOPY (EGD) WITH PROPOFOL (N/A) COLONOSCOPY WITH PROPOFOL (N/A)     Anesthesia Post Evaluation  Last Vitals:  Vitals:   07/28/16 0440 07/28/16 1551  BP: (!) 144/75 138/70  Pulse: 85 93  Resp: 20 20  Temp: 37.3 C 37.1 C    Last Pain:  Vitals:   07/28/16 1551  TempSrc: Oral  PainSc:                  Riccardo Dubin

## 2016-12-23 NOTE — Addendum Note (Signed)
Addendum  created 12/23/16 1455 by Lyndle Herrlich, MD   Sign clinical note

## 2016-12-26 DIAGNOSIS — L89223 Pressure ulcer of left hip, stage 3: Secondary | ICD-10-CM | POA: Diagnosis not present

## 2016-12-26 DIAGNOSIS — M62469 Contracture of muscle, unspecified lower leg: Secondary | ICD-10-CM | POA: Diagnosis not present

## 2016-12-26 DIAGNOSIS — M6289 Other specified disorders of muscle: Secondary | ICD-10-CM | POA: Diagnosis not present

## 2016-12-27 DIAGNOSIS — M62469 Contracture of muscle, unspecified lower leg: Secondary | ICD-10-CM | POA: Diagnosis not present

## 2016-12-27 DIAGNOSIS — M6289 Other specified disorders of muscle: Secondary | ICD-10-CM | POA: Diagnosis not present

## 2016-12-28 ENCOUNTER — Encounter (HOSPITAL_COMMUNITY): Payer: Self-pay | Admitting: Interventional Radiology

## 2016-12-28 ENCOUNTER — Ambulatory Visit (HOSPITAL_COMMUNITY)
Admission: RE | Admit: 2016-12-28 | Discharge: 2016-12-28 | Disposition: A | Discharge status: Home / Self Care | Payer: Medicare Other | Source: Ambulatory Visit | Attending: Urology | Admitting: Urology

## 2016-12-28 ENCOUNTER — Other Ambulatory Visit: Payer: Self-pay | Admitting: Urology

## 2016-12-28 DIAGNOSIS — Z436 Encounter for attention to other artificial openings of urinary tract: Secondary | ICD-10-CM | POA: Diagnosis not present

## 2016-12-28 DIAGNOSIS — Z87442 Personal history of urinary calculi: Secondary | ICD-10-CM | POA: Diagnosis not present

## 2016-12-28 DIAGNOSIS — G8220 Paraplegia, unspecified: Secondary | ICD-10-CM | POA: Diagnosis not present

## 2016-12-28 DIAGNOSIS — G35 Multiple sclerosis: Secondary | ICD-10-CM | POA: Insufficient documentation

## 2016-12-28 DIAGNOSIS — M62469 Contracture of muscle, unspecified lower leg: Secondary | ICD-10-CM | POA: Diagnosis not present

## 2016-12-28 DIAGNOSIS — N132 Hydronephrosis with renal and ureteral calculous obstruction: Secondary | ICD-10-CM

## 2016-12-28 DIAGNOSIS — M6289 Other specified disorders of muscle: Secondary | ICD-10-CM | POA: Diagnosis not present

## 2016-12-28 HISTORY — PX: IR NEPHROSTOGRAM LEFT THRU EXISTING ACCESS: IMG6061

## 2016-12-28 HISTORY — PX: IR NEPHROSTOGRAM RIGHT THRU EXISTING ACCESS: IMG6062

## 2016-12-28 MED ORDER — IOPAMIDOL (ISOVUE-300) INJECTION 61%
20.0000 mL | Freq: Once | INTRAVENOUS | Status: AC | PRN
  Administered 2016-12-28: 20 mL

## 2016-12-28 MED ORDER — IOPAMIDOL (ISOVUE-300) INJECTION 61%
INTRAVENOUS | Status: AC
  Filled 2016-12-28: qty 50

## 2016-12-28 MED ORDER — LIDOCAINE HCL 1 % IJ SOLN
INTRAMUSCULAR | Status: AC
  Filled 2016-12-28: qty 20

## 2016-12-29 DIAGNOSIS — M6289 Other specified disorders of muscle: Secondary | ICD-10-CM | POA: Diagnosis not present

## 2016-12-29 DIAGNOSIS — M62469 Contracture of muscle, unspecified lower leg: Secondary | ICD-10-CM | POA: Diagnosis not present

## 2016-12-30 DIAGNOSIS — M62469 Contracture of muscle, unspecified lower leg: Secondary | ICD-10-CM | POA: Diagnosis not present

## 2016-12-30 DIAGNOSIS — M6289 Other specified disorders of muscle: Secondary | ICD-10-CM | POA: Diagnosis not present

## 2017-01-02 DIAGNOSIS — M6289 Other specified disorders of muscle: Secondary | ICD-10-CM | POA: Diagnosis not present

## 2017-01-02 DIAGNOSIS — M62469 Contracture of muscle, unspecified lower leg: Secondary | ICD-10-CM | POA: Diagnosis not present

## 2017-01-02 DIAGNOSIS — G35 Multiple sclerosis: Secondary | ICD-10-CM | POA: Diagnosis not present

## 2017-01-03 DIAGNOSIS — M6289 Other specified disorders of muscle: Secondary | ICD-10-CM | POA: Diagnosis not present

## 2017-01-03 DIAGNOSIS — M62469 Contracture of muscle, unspecified lower leg: Secondary | ICD-10-CM | POA: Diagnosis not present

## 2017-01-04 DIAGNOSIS — M62469 Contracture of muscle, unspecified lower leg: Secondary | ICD-10-CM | POA: Diagnosis not present

## 2017-01-04 DIAGNOSIS — M6289 Other specified disorders of muscle: Secondary | ICD-10-CM | POA: Diagnosis not present

## 2017-01-05 DIAGNOSIS — M62469 Contracture of muscle, unspecified lower leg: Secondary | ICD-10-CM | POA: Diagnosis not present

## 2017-01-05 DIAGNOSIS — M6289 Other specified disorders of muscle: Secondary | ICD-10-CM | POA: Diagnosis not present

## 2017-01-05 DIAGNOSIS — G35 Multiple sclerosis: Secondary | ICD-10-CM | POA: Diagnosis not present

## 2017-01-06 DIAGNOSIS — M6289 Other specified disorders of muscle: Secondary | ICD-10-CM | POA: Diagnosis not present

## 2017-01-06 DIAGNOSIS — M62469 Contracture of muscle, unspecified lower leg: Secondary | ICD-10-CM | POA: Diagnosis not present

## 2017-01-07 DIAGNOSIS — L89322 Pressure ulcer of left buttock, stage 2: Secondary | ICD-10-CM | POA: Diagnosis not present

## 2017-01-09 DIAGNOSIS — M6289 Other specified disorders of muscle: Secondary | ICD-10-CM | POA: Diagnosis not present

## 2017-01-09 DIAGNOSIS — Z9181 History of falling: Secondary | ICD-10-CM | POA: Diagnosis not present

## 2017-01-09 DIAGNOSIS — G629 Polyneuropathy, unspecified: Secondary | ICD-10-CM | POA: Diagnosis not present

## 2017-01-09 DIAGNOSIS — L89223 Pressure ulcer of left hip, stage 3: Secondary | ICD-10-CM | POA: Diagnosis not present

## 2017-01-09 DIAGNOSIS — M62469 Contracture of muscle, unspecified lower leg: Secondary | ICD-10-CM | POA: Diagnosis not present

## 2017-01-09 DIAGNOSIS — E119 Type 2 diabetes mellitus without complications: Secondary | ICD-10-CM | POA: Diagnosis not present

## 2017-01-10 DIAGNOSIS — M6289 Other specified disorders of muscle: Secondary | ICD-10-CM | POA: Diagnosis not present

## 2017-01-10 DIAGNOSIS — M62469 Contracture of muscle, unspecified lower leg: Secondary | ICD-10-CM | POA: Diagnosis not present

## 2017-01-11 ENCOUNTER — Other Ambulatory Visit (HOSPITAL_COMMUNITY): Payer: Medicare Other

## 2017-02-10 ENCOUNTER — Encounter (HOSPITAL_COMMUNITY): Payer: Self-pay | Admitting: Interventional Radiology

## 2017-02-10 ENCOUNTER — Other Ambulatory Visit: Payer: Self-pay | Admitting: Urology

## 2017-02-10 ENCOUNTER — Ambulatory Visit (HOSPITAL_COMMUNITY)
Admission: RE | Admit: 2017-02-10 | Discharge: 2017-02-10 | Disposition: A | Discharge status: Home / Self Care | Payer: Medicare Other | Source: Ambulatory Visit | Attending: Urology | Admitting: Urology

## 2017-02-10 DIAGNOSIS — N132 Hydronephrosis with renal and ureteral calculous obstruction: Secondary | ICD-10-CM

## 2017-02-10 DIAGNOSIS — Z436 Encounter for attention to other artificial openings of urinary tract: Secondary | ICD-10-CM | POA: Diagnosis present

## 2017-02-10 HISTORY — PX: IR CATHETER TUBE CHANGE: IMG717

## 2017-02-10 MED ORDER — IOPAMIDOL (ISOVUE-300) INJECTION 61%
INTRAVENOUS | Status: AC
  Filled 2017-02-10: qty 50

## 2017-02-10 MED ORDER — LIDOCAINE VISCOUS 2 % MT SOLN
OROMUCOSAL | Status: AC
  Filled 2017-02-10: qty 15

## 2017-02-18 ENCOUNTER — Emergency Department (HOSPITAL_COMMUNITY)
Admission: EM | Admit: 2017-02-18 | Discharge: 2017-02-18 | Disposition: A | Discharge status: Home / Self Care | Payer: Medicare Other | Source: Home / Self Care | Attending: Emergency Medicine | Admitting: Emergency Medicine

## 2017-02-18 ENCOUNTER — Encounter (HOSPITAL_COMMUNITY): Payer: Self-pay | Admitting: Emergency Medicine

## 2017-02-18 ENCOUNTER — Emergency Department (HOSPITAL_COMMUNITY): Payer: Medicare Other

## 2017-02-18 ENCOUNTER — Encounter (HOSPITAL_COMMUNITY): Payer: Self-pay | Admitting: *Deleted

## 2017-02-18 ENCOUNTER — Emergency Department (HOSPITAL_COMMUNITY)
Admission: EM | Admit: 2017-02-18 | Discharge: 2017-02-18 | Disposition: A | Discharge status: Home / Self Care | Payer: Medicare Other | Attending: Emergency Medicine | Admitting: Emergency Medicine

## 2017-02-18 DIAGNOSIS — G35 Multiple sclerosis: Secondary | ICD-10-CM | POA: Insufficient documentation

## 2017-02-18 DIAGNOSIS — N3001 Acute cystitis with hematuria: Secondary | ICD-10-CM | POA: Diagnosis not present

## 2017-02-18 DIAGNOSIS — E1122 Type 2 diabetes mellitus with diabetic chronic kidney disease: Secondary | ICD-10-CM

## 2017-02-18 DIAGNOSIS — N319 Neuromuscular dysfunction of bladder, unspecified: Secondary | ICD-10-CM

## 2017-02-18 DIAGNOSIS — T83010A Breakdown (mechanical) of cystostomy catheter, initial encounter: Secondary | ICD-10-CM | POA: Insufficient documentation

## 2017-02-18 DIAGNOSIS — Z86718 Personal history of other venous thrombosis and embolism: Secondary | ICD-10-CM | POA: Insufficient documentation

## 2017-02-18 DIAGNOSIS — Z8673 Personal history of transient ischemic attack (TIA), and cerebral infarction without residual deficits: Secondary | ICD-10-CM | POA: Insufficient documentation

## 2017-02-18 DIAGNOSIS — I129 Hypertensive chronic kidney disease with stage 1 through stage 4 chronic kidney disease, or unspecified chronic kidney disease: Secondary | ICD-10-CM | POA: Diagnosis not present

## 2017-02-18 DIAGNOSIS — N183 Chronic kidney disease, stage 3 (moderate): Secondary | ICD-10-CM | POA: Insufficient documentation

## 2017-02-18 DIAGNOSIS — Z79899 Other long term (current) drug therapy: Secondary | ICD-10-CM | POA: Insufficient documentation

## 2017-02-18 DIAGNOSIS — E1140 Type 2 diabetes mellitus with diabetic neuropathy, unspecified: Secondary | ICD-10-CM | POA: Insufficient documentation

## 2017-02-18 DIAGNOSIS — Y732 Prosthetic and other implants, materials and accessory gastroenterology and urology devices associated with adverse incidents: Secondary | ICD-10-CM | POA: Diagnosis not present

## 2017-02-18 DIAGNOSIS — Z87891 Personal history of nicotine dependence: Secondary | ICD-10-CM

## 2017-02-18 DIAGNOSIS — Z7984 Long term (current) use of oral hypoglycemic drugs: Secondary | ICD-10-CM | POA: Diagnosis not present

## 2017-02-18 DIAGNOSIS — Z9359 Other cystostomy status: Secondary | ICD-10-CM

## 2017-02-18 LAB — URINALYSIS, ROUTINE W REFLEX MICROSCOPIC
Bilirubin Urine: NEGATIVE
GLUCOSE, UA: NEGATIVE mg/dL
KETONES UR: NEGATIVE mg/dL
Nitrite: NEGATIVE
Protein, ur: 100 mg/dL — AB
SQUAMOUS EPITHELIAL / LPF: NONE SEEN
Specific Gravity, Urine: 1.016 (ref 1.005–1.030)
pH: 7 (ref 5.0–8.0)

## 2017-02-18 MED ORDER — HYDROCODONE-ACETAMINOPHEN 5-325 MG PO TABS
1.0000 | ORAL_TABLET | Freq: Once | ORAL | Status: AC
  Administered 2017-02-18: 1 via ORAL
  Filled 2017-02-18: qty 1

## 2017-02-18 MED ORDER — IOPAMIDOL (ISOVUE-300) INJECTION 61%
50.0000 mL | Freq: Once | INTRAVENOUS | Status: AC | PRN
  Administered 2017-02-18: 50 mL

## 2017-02-18 MED ORDER — NITROFURANTOIN MONOHYD MACRO 100 MG PO CAPS
100.0000 mg | ORAL_CAPSULE | Freq: Once | ORAL | Status: AC
  Administered 2017-02-18: 100 mg via ORAL
  Filled 2017-02-18: qty 1

## 2017-02-18 MED ORDER — NITROFURANTOIN MONOHYD MACRO 100 MG PO CAPS: 100.0000 mg | ORAL_CAPSULE | Freq: Two times a day (BID) | ORAL | 0 refills | Status: DC

## 2017-02-18 NOTE — ED Triage Notes (Signed)
Pt discharged approximately 20 minutes ago. Seen here for a leaking Suprapubic. We changed the suprapubic and patient reports no more leaking but states that now she is urinating from her urethra which hasn't happened since she had the suprapubic catheter placed.

## 2017-02-18 NOTE — ED Triage Notes (Signed)
Pt bib EMS and coming from [facility]. Pt had suprapubic catheter placed a week ago from last Friday.  Pt is a chronic catheter user x "couple of months."  Pt reports the catheter was blocked for the past two days.  Subsequently, urine has been leaking around. Pt a/o x 4 and bed bound. Pt reports no other complaints to EMS.

## 2017-02-18 NOTE — ED Notes (Signed)
Patient transported to IR 

## 2017-02-18 NOTE — ED Notes (Signed)
Bed: VV61 Expected date:  Expected time:  Means of arrival:  Comments: 68f super pubic catheter leaking

## 2017-02-18 NOTE — ED Provider Notes (Signed)
[facility] DEPT Provider Note   CSN: [id number] Arrival date & time: 02/18/17  1422     History   Chief Complaint No chief complaint on file.   HPI [name] is a 56 y.o. female.  Pt presents to the ED today with malfunction of suprapubic catheter.  The pt has a hx of requiring suprapubic catheter due to neurogenic bladder due to [place] for months.  For the past few days, urine has been leaking around the catheter.  Upon my arrival to the pt's room, the catheter (16 F g-tube) was out of her bladder and in her bedding.  She does not know how long it has been out.  She did say it was replaced last week.        Past Medical History:  Diagnosis Date  . Acute parametritis and pelvic cellulitis   . Acute renal failure superimposed on stage 3 chronic kidney disease ([place]) 07/21/2015  . Anemia    chronic   . Contracture, right hand   . Diabetes mellitus without complication ([place])   . Diabetic neuropathy ([place])   . DVT (deep venous thrombosis) ([place])   . DVT (deep venous thrombosis) ([place])   . GERD (gastroesophageal reflux disease)   . History of kidney stones   . Hx of sepsis   . Hyperlipidemia   . Hypertension   . Hypokalemia   . Hypomagnesemia   . Left nephrolithiasis   . Leukocytosis   . Lymph edema    chronic   . Malnutrition of moderate degree 07/21/2015  . Multiple sclerosis diagnosed 2002-not on Therapy any longer 06/26/2012  . Muscle weakness (generalized)   . Neuromuscular disorder ([place])   . Neuromuscular dysfunction of bladder   . Polyneuropathy   . Presence of indwelling urinary catheter   . Protein calorie malnutrition ([place])   . Stage 4 decubitus ulcer ([place]) 07/05/2015  . Stroke ([place])   . UTI (lower urinary tract infection)     Patient Active Problem List   Diagnosis Date Noted  . Renal calculus 08/15/2016  . Kidney stone 08/15/2016  . Acute metabolic encephalopathy 42/68/3419  . Essential hypertension 07/19/2016  . Hydronephrosis   . Pelvic abscess in  female 03/17/2016  . Septic shock ([place]) 03/08/2016  . Neuropathic pain 01/11/2016  . Left nephrolithiasis 12/22/2015  . Pressure ulcer   . Controlled diabetes mellitus type 2 with complications ([place])   . AKI (acute kidney injury) ([place]) 11/11/2015  . Neurogenic bladder 11/11/2015  . Physical deconditioning 11/11/2015  . Chronic indwelling Foley catheter 09/30/2015  . Dyslipidemia associated with type 2 diabetes mellitus ([place]) 08/28/2015  . GERD without esophagitis 08/28/2015  . Anemia 07/21/2015  . Malnutrition of moderate degree 07/21/2015  . DVT, lower extremity ([place]) 12/28/2014  . Multiple sclerosis diagnosed 2002-not on Therapy any longer 06/26/2012    Past Surgical History:  Procedure Laterality Date  . COLONOSCOPY WITH PROPOFOL N/A 07/25/2016   Procedure: COLONOSCOPY WITH PROPOFOL;  Surgeon: Laurence Spates, MD;  Location: [facility] ENDOSCOPY;  Service: Endoscopy;  Laterality: N/A;  . ESOPHAGOGASTRODUODENOSCOPY Left 01/02/2015   Procedure: ESOPHAGOGASTRODUODENOSCOPY (EGD);  Surgeon: Arta Silence, MD;  Location: [facility];  Service: Endoscopy;  Laterality: Left;  . ESOPHAGOGASTRODUODENOSCOPY (EGD) WITH PROPOFOL N/A 07/25/2016   Procedure: ESOPHAGOGASTRODUODENOSCOPY (EGD) WITH PROPOFOL;  Surgeon: Laurence Spates, MD;  Location: [facility] END[facility];  Service: Endoscopy;  Laterality: N/A;  . HERNIA MESH REMOVAL    . HOLMIUM LASER APPLICATION Left 11/13/2295   Procedure: HOLMIUM LASER APPLICATION;  Surgeon: Annie Main  Dahlstedt, MD;  Location: [facility] ORS;  Service: Urology;  Laterality: Left;  . IR CATHETER TUBE CHANGE  10/06/2016  . IR CATHETER TUBE CHANGE  11/17/2016  . IR CATHETER TUBE CHANGE  02/10/2017  . IR GENERIC HISTORICAL  02/23/2016   IR NEPHROSTOMY EXCHANGE LEFT 02/23/2016 Sandi Mariscal, MD [facility]  . IR GENERIC HISTORICAL  04/19/2016   IR NEPHROSTOMY EXCHANGE LEFT 04/19/2016 Arne Cleveland, MD [facility]  . IR GENERIC HISTORICAL  05/13/2016   IR NEPHROSTOMY EXCHANGE LEFT 05/13/2016 Corrie Mckusick,  DO [facility]  . IR GENERIC HISTORICAL  05/13/2016   IR NEPHROSTOMY PLACEMENT RIGHT 05/13/2016 Corrie Mckusick, DO [facility]  . IR GENERIC HISTORICAL  07/07/2016   IR NEPHROSTOMY EXCHANGE RIGHT 07/07/2016 Jacqulynn Cadet, MD [facility]  . IR GENERIC HISTORICAL  07/07/2016   IR NEPHROSTOMY EXCHANGE LEFT 07/07/2016 Jacqulynn Cadet, MD [facility]  . IR NEPHROSTOGRAM LEFT THRU EXISTING ACCESS  12/28/2016  . IR NEPHROSTOGRAM RIGHT THRU EXISTING ACCESS  12/28/2016  . IR NEPHROSTOMY EXCHANGE LEFT  10/06/2016  . IR NEPHROSTOMY EXCHANGE LEFT  11/17/2016  . IR NEPHROSTOMY EXCHANGE RIGHT  10/06/2016  . IR NEPHROSTOMY EXCHANGE RIGHT  11/17/2016  . NEPHROLITHOTOMY Left 08/18/2016   Procedure: NEPHROLITHOTOMY LEFT PERCUTANEOUS;  Surgeon: Franchot Gallo, MD;  Location: [facility]S;  Service: Urology;  Laterality: Left;  . NEPHROSTOMY TUBE PLACEMENT ([place] HX)    . TRANSURETHRAL RESECTION OF BLADDER TUMOR N/A 03/16/2016   Procedure: CYSTOSCOPY BLADDER BIOPSY;  Surgeon: Franchot Gallo, MD;  Location: [facility];  Service: Urology;  Laterality: N/A;  . TUBAL LIGATION    . tubes tided      OB History    No data available       Home Medications    Prior to Admission medications   Medication Sig Start Date End Date Taking? Authorizing Provider  acetaminophen (TYLENOL) 325 MG tablet Take 2 tablets (650 mg total) by mouth every 6 (six) hours as needed for fever. 03/20/16  Yes Bonnielee Haff, MD  albuterol (PROVENTIL) (2.5 MG/3ML) 0.083% nebulizer solution Take 3 mLs (2.5 mg total) by nebulization every 6 (six) hours as needed for wheezing or shortness of breath. 12/30/15  Yes Robbie Lis, MD  atorvastatin (LIPITOR) 10 MG tablet Take 10 mg by mouth at bedtime.    Yes [provider]  famotidine (PEPCID) 20 MG tablet Take 1 tablet (20 mg total) by mouth 2 (two) times daily. 11/18/15  Yes Cherene Altes, MD  glipiZIDE (GLUCOTROL) 2.5 mg TABS tablet Take 2.5 mg by mouth daily before breakfast.   Yes  [provider]  lisinopril (PRINIVIL,ZESTRIL) 2.5 MG tablet Take 2.5 mg by mouth daily.  11/30/15  Yes [provider]  Magnesium 400 MG TABS Take 400 mg by mouth daily.   Yes [provider]  Multiple Vitamin (MULTIVITAMIN WITH MINERALS) TABS tablet Take 1 tablet by mouth daily. 07/09/15  Yes Florencia Reasons, MD  nystatin (NYSTATIN) powder Apply 1 g topically 2 (two) times daily.   Yes [provider]  oxybutynin (DITROPAN) 5 MG tablet Take 5 mg by mouth 2 (two) times daily as needed for bladder spasms.   Yes [provider]  polyethylene glycol (MIRALAX / GLYCOLAX) packet Take 17 g by mouth 2 (two) times daily. 11/18/15  Yes Cherene Altes, MD  potassium chloride (K-DUR,KLOR-CON) 10 MEQ tablet Take 1 tablet (10 mEq total) by mouth daily. 03/21/16  Yes Bonnielee Haff, MD  gabapentin (NEURONTIN) 100 MG capsule Take 1  capsule (100 mg total) by mouth at bedtime. Patient not taking: Reported on 02/18/2017 11/18/15   Cherene Altes, MD  HYDROcodone-homatropine [facility]) 5-1.5 MG/5ML syrup Take 5 mLs by mouth every 6 (six) hours as needed for cough. Patient not taking: Reported on 02/18/2017 07/28/16   Raiford Noble Latif, DO  metFORMIN (GLUCOPHAGE) 500 MG tablet Take 0.5 tablets (250 mg total) by mouth 2 (two) times daily. 04/22/16   Ngetich, Dinah C, NP  nitrofurantoin, macrocrystal-monohydrate, (MACROBID) 100 MG capsule Take 1 capsule (100 mg total) by mouth 2 (two) times daily. 02/18/17   Isla Pence, MD    Family History Family History  Problem Relation Age of Onset  . Diabetes Mother   . Alzheimer's disease Mother   . Diabetes Father   . Diabetes Sister   . Scoliosis Sister     Social History Social History  Substance Use Topics  . Smoking status: Former Smoker    Packs/day: 1.00    Years: 20.00    Types: Cigarettes    Quit date: 11/19/2014  . Smokeless tobacco: Never Used  . Alcohol use 3.0 oz/week    5 Glasses of wine per week      Allergies   Patient has no known allergies.   Review of Systems Review of Systems  Genitourinary:       Suprapubic catheter malfunction  All other systems reviewed and are negative.    Physical Exam Updated Vital Signs BP (!) 106/91 (BP Location: Left Arm)   Pulse 77   Temp 98.8 F (37.1 C) (Oral)   Resp 18   SpO2 100%   Physical Exam  Constitutional: She appears well-developed and well-nourished.  HENT:  Head: Normocephalic and atraumatic.  Right Ear: External ear normal.  Left Ear: External ear normal.  Nose: Nose normal.  Mouth/Throat: Oropharynx is clear and moist.  Eyes: Pupils are equal, round, and reactive to light. Conjunctivae and EOM are normal.  Neck: Normal range of motion. Neck supple.  Cardiovascular: Normal rate, regular rhythm, normal heart sounds and intact distal pulses.   Pulmonary/Chest: Effort normal and breath sounds normal.  Abdominal: Soft. Bowel sounds are normal.  Neurological: She is alert.  Bed bound  Skin: Skin is warm.  Psychiatric: She has a normal mood and affect. Her behavior is normal. Judgment and thought content normal.  Nursing note and vitals reviewed.    ED Treatments / Results  Labs (all labs ordered are listed, but only abnormal results are displayed) Labs Reviewed  URINALYSIS, ROUTINE W REFLEX MICROSCOPIC - Abnormal; Notable for the following:       Result Value   Color, Urine RED (*)    APPearance CLOUDY (*)    Hgb urine dipstick LARGE (*)    Protein, ur 100 (*)    Leukocytes, UA LARGE (*)    Bacteria, UA FEW (*)    All other components within normal limits  URINE CULTURE    EKG  EKG Interpretation None       Radiology No results found.  Procedures Procedures (including critical care time)  Medications Ordered in ED Medications  nitrofurantoin (macrocrystal-monohydrate) (MACROBID) capsule 100 mg (not administered)  iopamidol (ISOVUE-300) 61 % injection 50 mL (50 mLs Other Contrast Given 02/18/17  1745)  HYDROcodone-acetaminophen (NORCO/VICODIN) 5-325 MG per tablet 1 tablet (1 tablet Oral Given 02/18/17 1823)     Initial Impression / Assessment and Plan / ED Course  I have reviewed the triage vital signs and the nursing notes.  Pertinent labs &  imaging results that were available during my care of the patient were reviewed by me and considered in my medical decision making (see chart for details).    I attempted to place a 1 F, 14 F, and 12 F tube back in her bladder unsuccessfully.  I spoke with Dr. Diona Fanti (urology) who requested to I contact IR.  I spoke with Dr. Laurence Ferrari (IR) who came in to replace the catheter.  He was only able to get a small catheter (18f) through the stenosed tract under fluoro.  He will upsize it in the next few weeks.   Pt does have an uti.   Her last culture was in March and bacteria was sensitive to macrobid.  I will start that today.  Pt's urine today sent for culture. Final Clinical Impressions(s) / ED Diagnoses   Final diagnoses:  Suprapubic catheter dysfunction, initial encounter ([place])  Acute cystitis with hematuria    New Prescriptions New Prescriptions   NITROFURANTOIN, MACROCRYSTAL-MONOHYDRATE, (MACROBID) 100 MG CAPSULE    Take 1 capsule (100 mg total) by mouth 2 (two) times daily.     Isla Pence, MD 02/18/17 [phone]

## 2017-02-18 NOTE — ED Provider Notes (Addendum)
[facility] DEPT Provider Note   CSN: [id number] Arrival date & time: 02/18/17  2112     History   Chief Complaint Chief Complaint  Patient presents with  . Catheter Concerns    HPI [name] is a 56 y.o. female.  Pt en route back to the SNF from the ED here when she told the paramedics to turn around.  The pt was concerned that she was urinating from her urethra.  The pt has a neurogenic bladder and has a suprapubic catheter.  IR came in to place it today as it was dislodged.  The pt had to get an 72 F catheter placed as the tunnel was stenosed.  She thought all of her urine would come out the suprapubic catheter.      Past Medical History:  Diagnosis Date  . Acute parametritis and pelvic cellulitis   . Acute renal failure superimposed on stage 3 chronic kidney disease ([place]) 07/21/2015  . Anemia    chronic   . Contracture, right hand   . Diabetes mellitus without complication ([place])   . Diabetic neuropathy ([place])   . DVT (deep venous thrombosis) ([place])   . DVT (deep venous thrombosis) ([place])   . GERD (gastroesophageal reflux disease)   . History of kidney stones   . Hx of sepsis   . Hyperlipidemia   . Hypertension   . Hypokalemia   . Hypomagnesemia   . Left nephrolithiasis   . Leukocytosis   . Lymph edema    chronic   . Malnutrition of moderate degree 07/21/2015  . Multiple sclerosis diagnosed 2002-not on Therapy any longer 06/26/2012  . Muscle weakness (generalized)   . Neuromuscular disorder ([place])   . Neuromuscular dysfunction of bladder   . Polyneuropathy   . Presence of indwelling urinary catheter   . Protein calorie malnutrition ([place])   . Stage 4 decubitus ulcer ([place]) 07/05/2015  . Stroke ([place])   . UTI (lower urinary tract infection)     Patient Active Problem List   Diagnosis Date Noted  . Renal calculus 08/15/2016  . Kidney stone 08/15/2016  . Acute metabolic encephalopathy 59/56/3875  . Essential hypertension 07/19/2016  . Hydronephrosis   .  Pelvic abscess in female 03/17/2016  . Septic shock ([place]) 03/08/2016  . Neuropathic pain 01/11/2016  . Left nephrolithiasis 12/22/2015  . Pressure ulcer   . Controlled diabetes mellitus type 2 with complications ([place])   . AKI (acute kidney injury) ([place]) 11/11/2015  . Neurogenic bladder 11/11/2015  . Physical deconditioning 11/11/2015  . Chronic indwelling Foley catheter 09/30/2015  . Dyslipidemia associated with type 2 diabetes mellitus ([place]) 08/28/2015  . GERD without esophagitis 08/28/2015  . Anemia 07/21/2015  . Malnutrition of moderate degree 07/21/2015  . DVT, lower extremity ([place]) 12/28/2014  . Multiple sclerosis diagnosed 2002-not on Therapy any longer 06/26/2012    Past Surgical History:  Procedure Laterality Date  . COLONOSCOPY WITH PROPOFOL N/A 07/25/2016   Procedure: COLONOSCOPY WITH PROPOFOL;  Surgeon: Laurence Spates, MD;  Location: [facility];  Service: Endoscopy;  Laterality: N/A;  . ESOPHAGOGASTRODUODENOSCOPY Left 01/02/2015   Procedure: ESOPHAGOGASTRODUODENOSCOPY (EGD);  Surgeon: Arta Silence, MD;  Location: [facility];  Service: Endoscopy;  Laterality: Left;  . ESOPHAGOGASTRODUODENOSCOPY (EGD) WITH PROPOFOL N/A 07/25/2016   Procedure: ESOPHAGOGASTRODUODENOSCOPY (EGD) WITH PROPOFOL;  Surgeon: Laurence Spates, MD;  Location: [facility];  Service: Endoscopy;  Laterality: N/A;  . HERNIA MESH REMOVAL    . HOLMIUM LASER APPLICATION Left 11/15/3327   Procedure: HOLMIUM LASER APPLICATION;  Surgeon: Franchot Gallo, MD;  Location: [facility];  Service: Urology;  Laterality: Left;  . IR CATHETER TUBE CHANGE  10/06/2016  . IR CATHETER TUBE CHANGE  11/17/2016  . IR CATHETER TUBE CHANGE  02/10/2017  . IR GENERIC HISTORICAL  02/23/2016   IR NEPHROSTOMY EXCHANGE LEFT 02/23/2016 Sandi Mariscal, MD [facility]  . IR GENERIC HISTORICAL  04/19/2016   IR NEPHROSTOMY EXCHANGE LEFT 04/19/2016 Arne Cleveland, MD [facility]  . IR GENERIC HISTORICAL  05/13/2016   IR NEPHROSTOMY EXCHANGE LEFT  05/13/2016 Corrie Mckusick, DO [facility]  . IR GENERIC HISTORICAL  05/13/2016   IR NEPHROSTOMY PLACEMENT RIGHT 05/13/2016 Corrie Mckusick, DO [facility]  . IR GENERIC HISTORICAL  07/07/2016   IR NEPHROSTOMY EXCHANGE RIGHT 07/07/2016 Jacqulynn Cadet, MD [facility]  . IR GENERIC HISTORICAL  07/07/2016   IR NEPHROSTOMY EXCHANGE LEFT 07/07/2016 Jacqulynn Cadet, MD [facility]  . IR NEPHROSTOGRAM LEFT THRU EXISTING ACCESS  12/28/2016  . IR NEPHROSTOGRAM RIGHT THRU EXISTING ACCESS  12/28/2016  . IR NEPHROSTOMY EXCHANGE LEFT  10/06/2016  . IR NEPHROSTOMY EXCHANGE LEFT  11/17/2016  . IR NEPHROSTOMY EXCHANGE RIGHT  10/06/2016  . IR NEPHROSTOMY EXCHANGE RIGHT  11/17/2016  . NEPHROLITHOTOMY Left 08/18/2016   Procedure: NEPHROLITHOTOMY LEFT PERCUTANEOUS;  Surgeon: Franchot Gallo, MD;  Location: [facility];  Service: Urology;  Laterality: Left;  . NEPHROSTOMY TUBE PLACEMENT ([place] HX)    . TRANSURETHRAL RESECTION OF BLADDER TUMOR N/A 03/16/2016   Procedure: CYSTOSCOPY BLADDER BIOPSY;  Surgeon: Franchot Gallo, MD;  Location: [facility];  Service: Urology;  Laterality: N/A;  . TUBAL LIGATION    . tubes tided      OB History    No data available       Home Medications    Prior to Admission medications   Medication Sig Start Date End Date Taking? Authorizing Provider  acetaminophen (TYLENOL) 325 MG tablet Take 2 tablets (650 mg total) by mouth every 6 (six) hours as needed for fever. 03/20/16   Bonnielee Haff, MD  albuterol (PROVENTIL) (2.5 MG/3ML) 0.083% nebulizer solution Take 3 mLs (2.5 mg total) by nebulization every 6 (six) hours as needed for wheezing or shortness of breath. 12/30/15   Robbie Lis, MD  atorvastatin (LIPITOR) 10 MG tablet Take 10 mg by mouth at bedtime.     [provider]  famotidine (PEPCID) 20 MG tablet Take 1 tablet (20 mg total) by mouth 2 (two) times daily. 11/18/15   Cherene Altes, MD  gabapentin (NEURONTIN) 100 MG capsule Take 1 capsule (100 mg total) by mouth at  bedtime. Patient not taking: Reported on 02/18/2017 11/18/15   Cherene Altes, MD  glipiZIDE (GLUCOTROL) 2.5 mg TABS tablet Take 2.5 mg by mouth daily before breakfast.    [provider]  HYDROcodone-homatropine (HYCODAN) 5-1.5 MG/5ML syrup Take 5 mLs by mouth every 6 (six) hours as needed for cough. Patient not taking: Reported on 02/18/2017 07/28/16   Raiford Noble Latif, DO  lisinopril (PRINIVIL,ZESTRIL) 2.5 MG tablet Take 2.5 mg by mouth daily.  11/30/15   [provider]  Magnesium 400 MG TABS Take 400 mg by mouth daily.    [provider]  metFORMIN (GLUCOPHAGE) 500 MG tablet Take 0.5 tablets (250 mg total) by mouth 2 (two) times daily. 04/22/16   Ngetich, Dinah C, NP  Multiple Vitamin (MULTIVITAMIN WITH MINERALS) TABS tablet Take 1 tablet by mouth daily. 07/09/15   Florencia Reasons, MD  nitrofurantoin, macrocrystal-monohydrate, (MACROBID) 100 MG capsule Take 1  capsule (100 mg total) by mouth 2 (two) times daily. 02/18/17   Isla Pence, MD  nystatin (NYSTATIN) powder Apply 1 g topically 2 (two) times daily.    [provider]  oxybutynin (DITROPAN) 5 MG tablet Take 5 mg by mouth 2 (two) times daily as needed for bladder spasms.    [provider]  polyethylene glycol (MIRALAX / GLYCOLAX) packet Take 17 g by mouth 2 (two) times daily. 11/18/15   Cherene Altes, MD  potassium chloride (K-DUR,KLOR-CON) 10 MEQ tablet Take 1 tablet (10 mEq total) by mouth daily. 03/21/16   Bonnielee Haff, MD    Family History Family History  Problem Relation Age of Onset  . Diabetes Mother   . Alzheimer's disease Mother   . Diabetes Father   . Diabetes Sister   . Scoliosis Sister     Social History Social History  Substance Use Topics  . Smoking status: Former Smoker    Packs/day: 1.00    Years: 20.00    Types: Cigarettes    Quit date: 11/19/2014  . Smokeless tobacco: Never Used  . Alcohol use 3.0 oz/week    5 Glasses of wine per week     Allergies    Patient has no known allergies.   Review of Systems Review of Systems  Genitourinary: Positive for frequency.  All other systems reviewed and are negative.    Physical Exam Updated Vital Signs BP 112/62 (BP Location: Left Arm)   Pulse 68   Temp 99 F (37.2 C) (Oral)   Resp 18   SpO2 98%   Physical Exam  Constitutional: She is oriented to person, place, and time. She appears well-developed and well-nourished.  HENT:  Head: Normocephalic and atraumatic.  Right Ear: External ear normal.  Left Ear: External ear normal.  Nose: Nose normal.  Mouth/Throat: Oropharynx is clear and moist.  Eyes: Pupils are equal, round, and reactive to light. Conjunctivae and EOM are normal.  Neck: Normal range of motion. Neck supple.  Cardiovascular: Normal rate, regular rhythm, normal heart sounds and intact distal pulses.   Pulmonary/Chest: Effort normal and breath sounds normal.  Abdominal: Soft. Bowel sounds are normal.  8 F suprapubic catheter in place.  Draining urine.  Genitourinary:  Genitourinary Comments: External exam normal.  Urine in diaper.    Musculoskeletal: Normal range of motion.  Neurological: She is alert and oriented to person, place, and time.  Contractures.  Bed bound.  Skin: Skin is warm.  Psychiatric: She has a normal mood and affect. Her behavior is normal. Judgment and thought content normal.  Nursing note and vitals reviewed.    ED Treatments / Results  Labs (all labs ordered are listed, but only abnormal results are displayed) Labs Reviewed - No data to display  EKG  EKG Interpretation None       Radiology No results found.  Procedures Procedures (including critical care time)  Medications Ordered in ED Medications - No data to display   Initial Impression / Assessment and Plan / ED Course  I have reviewed the triage vital signs and the nursing notes.  Pertinent labs & imaging results that were available during my care of the patient were  reviewed by me and considered in my medical decision making (see chart for details).    Pt has urine coming out of urethra and urine draining from suprapubic catheter.  I see no indication for any further evaluation in the ED.  Pt sees Dr. Eulogio Ditch (urology).  She will  need to f/u with them.  Final Clinical Impressions(s) / ED Diagnoses   Final diagnoses:  Neurogenic bladder    New Prescriptions New Prescriptions   No medications on file     Isla Pence, MD 02/18/17 2139    Isla Pence, MD 02/18/17 2140

## 2017-02-18 NOTE — Procedures (Signed)
Interventional Radiology Procedure Note  Procedure: Replacement of dislodged suprapubic tube.  An 56F foley catheter was navigated through the nearly occluded soft tissue tract.    Complications: None.  Estimated Blood Loss: <25 mL  Recommendations:  - It is critically important that this woman's caregivers understand the significance of this tube becoming dislodged.  It is extremely painful for the patient to undergo replacement.  Extreme caution needs to be taken at all times to protect this tube from coming out.   - Tube tract is heavily scarred and stenosed.  Only a small 56F tube could be reinserted.   - Recommend return to IR as an out-pt during business hours in the next 2 weeks to have tube upsized with sedation.  A larger tube with a larger retention balloon will have a better chance of staying in place.   Signed,  Criselda Peaches, MD

## 2017-02-20 LAB — URINE CULTURE

## 2017-02-21 ENCOUNTER — Other Ambulatory Visit (HOSPITAL_COMMUNITY): Payer: Self-pay | Admitting: Interventional Radiology

## 2017-02-21 DIAGNOSIS — N132 Hydronephrosis with renal and ureteral calculous obstruction: Secondary | ICD-10-CM

## 2017-03-01 ENCOUNTER — Ambulatory Visit (HOSPITAL_COMMUNITY): Admission: RE | Admit: 2017-03-01 | Payer: Medicare Other | Source: Ambulatory Visit

## 2017-03-01 ENCOUNTER — Other Ambulatory Visit (HOSPITAL_COMMUNITY): Payer: Medicare Other

## 2017-05-08 ENCOUNTER — Other Ambulatory Visit (HOSPITAL_COMMUNITY): Payer: Medicare Other

## 2017-09-11 ENCOUNTER — Emergency Department (HOSPITAL_COMMUNITY): Payer: Medicare Other

## 2017-09-11 ENCOUNTER — Other Ambulatory Visit: Payer: Self-pay

## 2017-09-11 ENCOUNTER — Inpatient Hospital Stay (HOSPITAL_COMMUNITY)
Admission: EM | Admit: 2017-09-11 | Discharge: 2017-09-19 | DRG: 660 | Disposition: A | Discharge status: Skilled Nursing Facility | Payer: Medicare Other | Attending: Internal Medicine | Admitting: Internal Medicine

## 2017-09-11 DIAGNOSIS — N136 Pyonephrosis: Secondary | ICD-10-CM | POA: Diagnosis present

## 2017-09-11 DIAGNOSIS — L739 Follicular disorder, unspecified: Secondary | ICD-10-CM | POA: Diagnosis not present

## 2017-09-11 DIAGNOSIS — Z86718 Personal history of other venous thrombosis and embolism: Secondary | ICD-10-CM

## 2017-09-11 DIAGNOSIS — B9620 Unspecified Escherichia coli [E. coli] as the cause of diseases classified elsewhere: Secondary | ICD-10-CM | POA: Diagnosis present

## 2017-09-11 DIAGNOSIS — Z79899 Other long term (current) drug therapy: Secondary | ICD-10-CM

## 2017-09-11 DIAGNOSIS — Z1612 Extended spectrum beta lactamase (ESBL) resistance: Secondary | ICD-10-CM | POA: Diagnosis present

## 2017-09-11 DIAGNOSIS — Z960 Presence of urogenital implants: Secondary | ICD-10-CM

## 2017-09-11 DIAGNOSIS — Z8673 Personal history of transient ischemic attack (TIA), and cerebral infarction without residual deficits: Secondary | ICD-10-CM

## 2017-09-11 DIAGNOSIS — Y846 Urinary catheterization as the cause of abnormal reaction of the patient, or of later complication, without mention of misadventure at the time of the procedure: Secondary | ICD-10-CM | POA: Diagnosis present

## 2017-09-11 DIAGNOSIS — N200 Calculus of kidney: Secondary | ICD-10-CM

## 2017-09-11 DIAGNOSIS — N390 Urinary tract infection, site not specified: Secondary | ICD-10-CM | POA: Diagnosis present

## 2017-09-11 DIAGNOSIS — Z978 Presence of other specified devices: Secondary | ICD-10-CM

## 2017-09-11 DIAGNOSIS — G35 Multiple sclerosis: Secondary | ICD-10-CM | POA: Diagnosis present

## 2017-09-11 DIAGNOSIS — Z7984 Long term (current) use of oral hypoglycemic drugs: Secondary | ICD-10-CM

## 2017-09-11 DIAGNOSIS — T83030A Leakage of cystostomy catheter, initial encounter: Secondary | ICD-10-CM | POA: Diagnosis not present

## 2017-09-11 DIAGNOSIS — R109 Unspecified abdominal pain: Secondary | ICD-10-CM

## 2017-09-11 DIAGNOSIS — E118 Type 2 diabetes mellitus with unspecified complications: Secondary | ICD-10-CM | POA: Diagnosis present

## 2017-09-11 DIAGNOSIS — N319 Neuromuscular dysfunction of bladder, unspecified: Secondary | ICD-10-CM | POA: Diagnosis present

## 2017-09-11 DIAGNOSIS — I129 Hypertensive chronic kidney disease with stage 1 through stage 4 chronic kidney disease, or unspecified chronic kidney disease: Secondary | ICD-10-CM | POA: Diagnosis present

## 2017-09-11 DIAGNOSIS — E785 Hyperlipidemia, unspecified: Secondary | ICD-10-CM | POA: Diagnosis present

## 2017-09-11 DIAGNOSIS — N183 Chronic kidney disease, stage 3 (moderate): Secondary | ICD-10-CM | POA: Diagnosis present

## 2017-09-11 DIAGNOSIS — T83510A Infection and inflammatory reaction due to cystostomy catheter, initial encounter: Secondary | ICD-10-CM | POA: Diagnosis not present

## 2017-09-11 DIAGNOSIS — Z87891 Personal history of nicotine dependence: Secondary | ICD-10-CM

## 2017-09-11 DIAGNOSIS — M792 Neuralgia and neuritis, unspecified: Secondary | ICD-10-CM | POA: Diagnosis present

## 2017-09-11 DIAGNOSIS — E1122 Type 2 diabetes mellitus with diabetic chronic kidney disease: Secondary | ICD-10-CM

## 2017-09-11 DIAGNOSIS — N12 Tubulo-interstitial nephritis, not specified as acute or chronic: Secondary | ICD-10-CM

## 2017-09-11 DIAGNOSIS — K219 Gastro-esophageal reflux disease without esophagitis: Secondary | ICD-10-CM | POA: Diagnosis present

## 2017-09-11 DIAGNOSIS — N1330 Unspecified hydronephrosis: Secondary | ICD-10-CM | POA: Diagnosis present

## 2017-09-11 DIAGNOSIS — D649 Anemia, unspecified: Secondary | ICD-10-CM | POA: Diagnosis not present

## 2017-09-11 DIAGNOSIS — E1140 Type 2 diabetes mellitus with diabetic neuropathy, unspecified: Secondary | ICD-10-CM | POA: Diagnosis present

## 2017-09-11 DIAGNOSIS — N10 Acute tubulo-interstitial nephritis: Secondary | ICD-10-CM

## 2017-09-11 DIAGNOSIS — I10 Essential (primary) hypertension: Secondary | ICD-10-CM | POA: Diagnosis present

## 2017-09-11 LAB — URINALYSIS, ROUTINE W REFLEX MICROSCOPIC

## 2017-09-11 LAB — URINALYSIS, MICROSCOPIC (REFLEX)

## 2017-09-11 LAB — COMPREHENSIVE METABOLIC PANEL
ALBUMIN: 3.6 g/dL (ref 3.5–5.0)
ALT: 15 U/L (ref 14–54)
AST: 16 U/L (ref 15–41)
Alkaline Phosphatase: 74 U/L (ref 38–126)
Anion gap: 12 (ref 5–15)
BUN: 16 mg/dL (ref 6–20)
CHLORIDE: 107 mmol/L (ref 101–111)
CO2: 21 mmol/L — ABNORMAL LOW (ref 22–32)
Calcium: 10.5 mg/dL — ABNORMAL HIGH (ref 8.9–10.3)
Creatinine, Ser: 0.83 mg/dL (ref 0.44–1.00)
GFR calc Af Amer: >60 mL/min (ref >60)
GFR calc non Af Amer: >60 mL/min (ref >60)
GLUCOSE: 162 mg/dL — AB (ref 65–99)
POTASSIUM: 4.4 mmol/L (ref 3.5–5.1)
Sodium: 140 mmol/L (ref 135–145)
Total Bilirubin: 0.4 mg/dL (ref 0.3–1.2)
Total Protein: 8.5 g/dL — ABNORMAL HIGH (ref 6.5–8.1)

## 2017-09-11 LAB — LIPASE, BLOOD: LIPASE: 43 U/L (ref 11–51)

## 2017-09-11 LAB — CBC
HCT: 33.3 % — ABNORMAL LOW (ref 36.0–46.0)
Hemoglobin: 10.4 g/dL — ABNORMAL LOW (ref 12.0–15.0)
MCH: 29.1 pg (ref 26.0–34.0)
MCHC: 31.2 g/dL (ref 30.0–36.0)
MCV: 93.3 fL (ref 78.0–100.0)
Platelets: 455 10*3/uL — ABNORMAL HIGH (ref 150–400)
RBC: 3.57 MIL/uL — ABNORMAL LOW (ref 3.87–5.11)
RDW: 17.3 % — AB (ref 11.5–15.5)
WBC: 13.6 10*3/uL — AB (ref 4.0–10.5)

## 2017-09-11 MED ORDER — IOPAMIDOL (ISOVUE-300) INJECTION 61%
INTRAVENOUS | Status: AC
  Filled 2017-09-11: qty 100

## 2017-09-11 MED ORDER — SODIUM CHLORIDE 0.9 % IV SOLN
1.0000 g | Freq: Three times a day (TID) | INTRAVENOUS | Status: DC
  Administered 2017-09-12 – 2017-09-18 (×21): 1 g via INTRAVENOUS
  Filled 2017-09-11 (×24): qty 1

## 2017-09-11 MED ORDER — SODIUM CHLORIDE 0.9 % IJ SOLN
INTRAMUSCULAR | Status: AC
  Filled 2017-09-11: qty 50

## 2017-09-11 MED ORDER — SODIUM CHLORIDE 0.9 % IV BOLUS
1000.0000 mL | Freq: Once | INTRAVENOUS | Status: AC
  Administered 2017-09-11: 1000 mL via INTRAVENOUS

## 2017-09-11 MED ORDER — IOPAMIDOL (ISOVUE-300) INJECTION 61%
100.0000 mL | Freq: Once | INTRAVENOUS | Status: AC | PRN
  Administered 2017-09-11: 100 mL via INTRAVENOUS

## 2017-09-11 NOTE — ED Provider Notes (Addendum)
[facility] DEPT Provider Note   CSN: [id number] Arrival date & time: 09/11/17  1908     History   Chief Complaint Chief Complaint  Patient presents with  . Abdominal Pain  . Foley Site Pain    HPI [name] is a 57 y.o. female.  HPI   Pt is a 57 y/o with a h/o DM, GERD, nephrolithiasis, MS, HTN, HLD, MS, neurogenic bladder s/p suprapubic catheter, female who presents to the ED c/o bilat lower abd pain that began about 1 month ago. States pain has been intermittent but has worsened over the last few days. Pain is now constant. Denies that pain radiates to her back.   Pt also c/o pain to her pelvic area/vagina that has been present for the same time period. She denies nausea, vomiting, diarrhea. Denies fevers or back pain. Denies chest pain or shortness of breath. No dizziness or lightheadedness.  Past Medical History:  Diagnosis Date  . Acute parametritis and pelvic cellulitis   . Acute renal failure superimposed on stage 3 chronic kidney disease ([place]) 07/21/2015  . Anemia    chronic   . Contracture, right hand   . Diabetes mellitus without complication ([place])   . Diabetic neuropathy ([place])   . DVT (deep venous thrombosis) ([place])   . DVT (deep venous thrombosis) ([place])   . GERD (gastroesophageal reflux disease)   . History of kidney stones   . Hx of sepsis   . Hyperlipidemia   . Hypertension   . Hypokalemia   . Hypomagnesemia   . Left nephrolithiasis   . Leukocytosis   . Lymph edema    chronic   . Malnutrition of moderate degree 07/21/2015  . Multiple sclerosis diagnosed 2002-not on Therapy any longer 06/26/2012  . Muscle weakness (generalized)   . Neuromuscular disorder ([place])   . Neuromuscular dysfunction of bladder   . Polyneuropathy   . Presence of indwelling urinary catheter   . Protein calorie malnutrition ([place])   . Stage 4 decubitus ulcer ([place]) 07/05/2015  . Stroke ([place])   . UTI (lower urinary tract infection)     Patient  Active Problem List   Diagnosis Date Noted  . UTI due to extended-spectrum beta lactamase (ESBL) producing Escherichia coli 09/12/2017  . Renal calculus 08/15/2016  . Kidney stone 08/15/2016  . Acute metabolic encephalopathy 53/29/9242  . Essential hypertension 07/19/2016  . Hydronephrosis   . Pelvic abscess in female 03/17/2016  . Septic shock ([place]) 03/08/2016  . Neuropathic pain 01/11/2016  . Left nephrolithiasis 12/22/2015  . Pressure ulcer   . Controlled diabetes mellitus type 2 with complications ([place])   . AKI (acute kidney injury) ([place]) 11/11/2015  . Neurogenic bladder 11/11/2015  . Physical deconditioning 11/11/2015  . Chronic indwelling Foley catheter 09/30/2015  . Dyslipidemia associated with type 2 diabetes mellitus ([place]) 08/28/2015  . GERD without esophagitis 08/28/2015  . Anemia 07/21/2015  . Malnutrition of moderate degree 07/21/2015  . DVT, lower extremity ([place]) 12/28/2014  . Multiple sclerosis diagnosed 2002-not on Therapy any longer 06/26/2012    Past Surgical History:  Procedure Laterality Date  . COLONOSCOPY WITH PROPOFOL N/A 07/25/2016   Procedure: COLONOSCOPY WITH PROPOFOL;  Surgeon: Laurence Spates, MD;  Location: [facility];  Service: Endoscopy;  Laterality: N/A;  . ESOPHAGOGASTRODUODENOSCOPY Left 01/02/2015   Procedure: ESOPHAGOGASTRODUODENOSCOPY (EGD);  Surgeon: Arta Silence, MD;  Location: [facility];  Service: Endoscopy;  Laterality: Left;  . ESOPHAGOGASTRODUODENOSCOPY (EGD) WITH PROPOFOL N/A 07/25/2016   Procedure: ESOPHAGOGASTRODUODENOSCOPY (EGD)  WITH PROPOFOL;  Surgeon: Laurence Spates, MD;  Location: [facility];  Service: Endoscopy;  Laterality: N/A;  . HERNIA MESH REMOVAL    . HOLMIUM LASER APPLICATION Left 07/19/7122   Procedure: HOLMIUM LASER APPLICATION;  Surgeon: Franchot Gallo, MD;  Location: [facility];  Service: Urology;  Laterality: Left;  . IR CATHETER TUBE CHANGE  10/06/2016  . IR CATHETER TUBE CHANGE  11/17/2016  . IR CATHETER TUBE CHANGE   02/10/2017  . IR GENERIC HISTORICAL  02/23/2016   IR NEPHROSTOMY EXCHANGE LEFT 02/23/2016 Sandi Mariscal, MD [facility]  . IR GENERIC HISTORICAL  04/19/2016   IR NEPHROSTOMY EXCHANGE LEFT 04/19/2016 Arne Cleveland, MD [facility]  . IR GENERIC HISTORICAL  05/13/2016   IR NEPHROSTOMY EXCHANGE LEFT 05/13/2016 Corrie Mckusick, DO [facility]  . IR GENERIC HISTORICAL  05/13/2016   IR NEPHROSTOMY PLACEMENT RIGHT 05/13/2016 Corrie Mckusick, DO [facility]  . IR GENERIC HISTORICAL  07/07/2016   IR NEPHROSTOMY EXCHANGE RIGHT 07/07/2016 Jacqulynn Cadet, MD [facility]  . IR GENERIC HISTORICAL  07/07/2016   IR NEPHROSTOMY EXCHANGE LEFT 07/07/2016 Jacqulynn Cadet, MD [facility]  . IR NEPHROSTOGRAM LEFT THRU EXISTING ACCESS  12/28/2016  . IR NEPHROSTOGRAM RIGHT THRU EXISTING ACCESS  12/28/2016  . IR NEPHROSTOMY EXCHANGE LEFT  10/06/2016  . IR NEPHROSTOMY EXCHANGE LEFT  11/17/2016  . IR NEPHROSTOMY EXCHANGE RIGHT  10/06/2016  . IR NEPHROSTOMY EXCHANGE RIGHT  11/17/2016  . NEPHROLITHOTOMY Left 08/18/2016   Procedure: NEPHROLITHOTOMY LEFT PERCUTANEOUS;  Surgeon: Franchot Gallo, MD;  Location: [facility];  Service: Urology;  Laterality: Left;  . NEPHROSTOMY TUBE PLACEMENT ([place] [facility])    . TRANSURETHRAL RESECTION OF BLADDER TUMOR N/A 03/16/2016   Procedure: CYSTOSCOPY BLADDER BIOPSY;  Surgeon: Franchot Gallo, MD;  Location: [facility];  Service: Urology;  Laterality: N/A;  . TUBAL LIGATION    . tubes tided       OB History   None      Home Medications    Prior to Admission medications   Medication Sig Start Date End Date Taking? Authorizing Provider  acetaminophen (TYLENOL) 325 MG tablet Take 2 tablets (650 mg total) by mouth every 6 (six) hours as needed for fever. 03/20/16  Yes Bonnielee Haff, MD  atorvastatin (LIPITOR) 10 MG tablet Take 10 mg by mouth at bedtime.    Yes [provider]  famotidine (PEPCID) 20 MG tablet Take 1 tablet (20 mg total) by mouth 2 (two) times daily. 11/18/15  Yes Cherene Altes, MD  ferrous sulfate 325 (65 FE) MG EC tablet Take 325 mg by mouth 2 (two) times daily.   Yes [provider]  gabapentin (NEURONTIN) 100 MG capsule Take 1 capsule (100 mg total) by mouth at bedtime. 11/18/15  Yes Cherene Altes, MD  glipiZIDE (GLUCOTROL) 2.5 mg TABS tablet Take 2.5 mg by mouth daily before breakfast.   Yes [provider]  lisinopril (PRINIVIL,ZESTRIL) 2.5 MG tablet Take 2.5 mg by mouth daily.  11/30/15  Yes [provider]  Magnesium 400 MG TABS Take 400 mg by mouth daily.   Yes [provider]  metFORMIN (GLUCOPHAGE) 500 MG tablet Take 0.5 tablets (250 mg total) by mouth 2 (two) times daily. Patient taking differently: Take 500 mg by mouth 2 (two) times daily.  04/22/16  Yes Ngetich, Dinah C, NP  Multiple Vitamin (MULTIVITAMIN WITH MINERALS) TABS tablet Take 1 tablet by mouth daily. 07/09/15  Yes Florencia Reasons, MD  polyethylene glycol [facility] / Floria [facility]ing) packet Take 17 g  by mouth 2 (two) times daily. 11/18/15  Yes Cherene Altes, MD  potassium chloride (K-DUR,KLOR-CON) 10 MEQ tablet Take 1 tablet (10 mEq total) by mouth daily. 03/21/16  Yes Bonnielee Haff, MD  albuterol (PROVENTIL) (2.5 MG/3ML) 0.083% nebulizer solution Take 3 mLs (2.5 mg total) by nebulization every 6 (six) hours as needed for wheezing or shortness of breath. 12/30/15   Robbie Lis, MD  oxybutynin (DITROPAN) 5 MG tablet Take 5 mg by mouth 2 (two) times daily as needed for bladder spasms.    [provider]    Family History Family History  Problem Relation Age of Onset  . Diabetes Mother   . Alzheimer's disease Mother   . Diabetes Father   . Diabetes Sister   . Scoliosis Sister     Social History Social History   Tobacco Use  . Smoking status: Former Smoker    Packs/day: 1.00    Years: 20.00    Pack years: 20.00    Types: Cigarettes    Last attempt to quit: 11/19/2014    Years since quitting: 2.8  . Smokeless tobacco: Never Used  Substance  Use Topics  . Alcohol use: Yes    Alcohol/week: 3.0 oz    Types: 5 Glasses of wine per week  . Drug use: No     Allergies   Patient has no known allergies.   Review of Systems Review of Systems  Constitutional: Negative for fever.  HENT: Negative for ear pain and sore throat.   Eyes: Negative for visual disturbance.  Respiratory: Negative for cough and shortness of breath.   Cardiovascular: Negative for chest pain and palpitations.  Gastrointestinal: Positive for abdominal pain. Negative for constipation, diarrhea, nausea and vomiting.  Genitourinary: Positive for pelvic pain and vaginal pain. Negative for flank pain.  Musculoskeletal: Negative for back pain.  Skin: Negative for rash.  Neurological: Negative for headaches.  All other systems reviewed and are negative.    Physical Exam Updated Vital Signs BP 118/80 (BP Location: Right Arm)   Pulse 91   Temp 98.4 F (36.9 C) (Oral)   Resp 18   Ht 5' 5.5" (1.664 m)   Wt 64.9 kg (143 lb)   SpO2 99%   BMI 23.43 kg/m   Physical Exam  Constitutional: She appears well-developed and well-nourished.  Non-toxic appearance. She does not appear ill. No distress.  HENT:  Head: Normocephalic and atraumatic.  Mouth/Throat: Oropharynx is clear and moist.  Eyes: Conjunctivae are normal. No scleral icterus.  Neck: Neck supple.  Cardiovascular: Normal rate, regular rhythm and normal heart sounds.  No murmur heard. Pulmonary/Chest: Effort normal and breath sounds normal. No respiratory distress. She has no wheezes.  Abdominal: Soft. Bowel sounds are normal. There is tenderness in the periumbilical area and suprapubic area. There is guarding. There is no rigidity and no rebound.  Genitourinary:  Genitourinary Comments: Pt has no obvious rashes or cellulitis to pelvic area, cream is present to pelvic area.  Musculoskeletal: She exhibits no edema.  Neurological: She is alert.  Skin: Skin is warm and dry.  Psychiatric: She has a  normal mood and affect.  Nursing note and vitals reviewed.    ED Treatments / Results  Labs (all labs ordered are listed, but only abnormal results are displayed) Labs Reviewed  COMPREHENSIVE METABOLIC PANEL - Abnormal; Notable for the following components:      Result Value   CO2 21 (*)    Glucose, Bld 162 (*)    Calcium  10.5 (*)    Total Protein 8.5 (*)    All other components within normal limits  CBC - Abnormal; Notable for the following components:   WBC 13.6 (*)    RBC 3.57 (*)    Hemoglobin 10.4 (*)    HCT 33.3 (*)    RDW 17.3 (*)    Platelets 455 (*)    All other components within normal limits  URINALYSIS, ROUTINE W REFLEX MICROSCOPIC - Abnormal; Notable for the following components:   Color, Urine BROWN (*)    APPearance TURBID (*)    Glucose, UA   (*)    Value: TEST NOT REPORTED DUE TO COLOR INTERFERENCE OF URINE PIGMENT   Hgb urine dipstick   (*)    Value: TEST NOT REPORTED DUE TO COLOR INTERFERENCE OF URINE PIGMENT   Bilirubin Urine   (*)    Value: TEST NOT REPORTED DUE TO COLOR INTERFERENCE OF URINE PIGMENT   Ketones, ur   (*)    Value: TEST NOT REPORTED DUE TO COLOR INTERFERENCE OF URINE PIGMENT   Protein, ur   (*)    Value: TEST NOT REPORTED DUE TO COLOR INTERFERENCE OF URINE PIGMENT   Nitrite   (*)    Value: TEST NOT REPORTED DUE TO COLOR INTERFERENCE OF URINE PIGMENT   Leukocytes, UA   (*)    Value: TEST NOT REPORTED DUE TO COLOR INTERFERENCE OF URINE PIGMENT   All other components within normal limits  URINALYSIS, MICROSCOPIC (REFLEX) - Abnormal; Notable for the following components:   Bacteria, UA MANY (*)    Squamous Epithelial / LPF TOO NUMEROUS TO COUNT (*)    All other components within normal limits  URINE CULTURE  LIPASE, BLOOD  HIV ANTIBODY (ROUTINE TESTING)  CBC  BASIC METABOLIC PANEL    EKG None  Radiology Ct Abdomen Pelvis W Contrast  Result Date: 09/11/2017 CLINICAL DATA:  Abdominal pain at catheter site. History of multiple  sclerosis, decubitus ulcers, stroke. EXAM: CT ABDOMEN AND PELVIS WITH CONTRAST TECHNIQUE: Multidetector CT imaging of the abdomen and pelvis was performed using the standard protocol following bolus administration of intravenous contrast. CONTRAST:  136mL ISOVUE-300 IOPAMIDOL (ISOVUE-300) INJECTION 61% COMPARISON:  CT abdomen and pelvis July 26, 2017 FINDINGS: LOWER CHEST: Dependent atelectasis. Included heart size is normal. No pericardial effusion. HEPATOBILIARY: Liver and gallbladder are normal. PANCREAS: Normal. SPLEEN: Normal. ADRENALS/URINARY TRACT: Kidneys orthotopic. Striated enhancement RIGHT kidney with moderate hydronephrosis and urothelial enhancement. Interval removal of nephrostomy tube with scarring. Moderate LEFT hydronephrosis with multiple parapelvic cyst and urothelial enhancement. Multiple LEFT nephrolithiasis measuring to 3 mm. LEFT greater than RIGHT periureteral fat stranding. 4 mm distal LEFT ureteral calculus; dense, likely debris distended LEFT ureter. Interval LEFT nephrostomy tube. Urinary bladder decompressed with indwelling suprapubic catheter. No inflammatory changes or focal fluid collection about the catheter tract. STOMACH/BOWEL: The stomach, small and large bowel are normal in course and caliber without inflammatory changes. Normal appendix. VASCULAR/LYMPHATIC: Aortoiliac vessels are normal in course and caliber. Mild intimal thickening calcific atherosclerosis. No lymphadenopathy by CT size criteria. REPRODUCTIVE: Lobulated heterogeneously enhancing uterus most compatible with leiomyomas. OTHER: No intraperitoneal free fluid or free air. MUSCULOSKELETAL: Nonacute. Anterior abdominal wall ligamentous laxity. Osteopenia. Generalized muscle atrophy. Scarring extending to ischial tuberosities compatible with old decubitus ulcers without focal fluid collection. IMPRESSION: 1. 4 mm distal LEFT ureteral calculus. Multiple LEFT nephrolithiasis measuring to 3 mm. 2. Bilateral pyelitis  and RIGHT pyelonephritis. 3. Moderate bilateral hydronephrosis, status post interval removal of nephrostomy tubes. Aortic  Atherosclerosis (ICD10-I70.0). Electronically Signed   By: Elon Alas M.D.   On: 09/11/2017 22:32    Procedures Procedures (including critical care time)  Medications Ordered in ED Medications  iopamidol (ISOVUE-300) 61 % injection (has no administration in time range)  sodium chloride 0.9 % injection (has no administration in time range)  meropenem (MERREM) 1 g in sodium chloride 0.9 % 100 mL IVPB (1 g Intravenous New Bag/Given 09/12/17 0020)  acetaminophen (TYLENOL) tablet 650 mg (has no administration in time range)    Or  acetaminophen (TYLENOL) suppository 650 mg (has no administration in time range)  ondansetron (ZOFRAN) tablet 4 mg (has no administration in time range)    Or  ondansetron (ZOFRAN) injection 4 mg (has no administration in time range)  enoxaparin (LOVENOX) injection 40 mg (has no administration in time range)  insulin aspart (novoLOG) injection 0-15 Units (has no administration in time range)  acetaminophen (TYLENOL) tablet 650 mg (has no administration in time range)  albuterol (PROVENTIL) (2.5 MG/3ML) 0.083% nebulizer solution 2.5 mg (has no administration in time range)  atorvastatin (LIPITOR) tablet 10 mg (has no administration in time range)  famotidine (PEPCID) tablet 20 mg (has no administration in time range)  ferrous sulfate EC tablet 325 mg (has no administration in time range)  gabapentin (NEURONTIN) capsule 100 mg (has no administration in time range)  lisinopril (PRINIVIL,ZESTRIL) tablet 2.5 mg (has no administration in time range)  Magnesium TABS 400 mg (has no administration in time range)  multivitamin with minerals tablet 1 tablet (has no administration in time range)  oxybutynin (DITROPAN) tablet 5 mg (has no administration in time range)  polyethylene glycol (MIRALAX / GLYCOLAX) packet 17 g (has no administration in time  range)  potassium chloride (K-DUR,KLOR-CON) CR tablet 10 mEq (has no administration in time range)  sodium chloride 0.9 % bolus 1,000 mL (0 mLs Intravenous Stopped 09/12/17 0004)  iopamidol (ISOVUE-300) 61 % injection 100 mL (100 mLs Intravenous Contrast Given 09/11/17 2209)     Initial Impression / Assessment and Plan / ED Course  I have reviewed the triage vital signs and the nursing notes.  Pertinent labs & imaging results that were available during my care of the patient were reviewed by me and considered in my medical decision making (see chart for details).   11:12 PM [place], will see in the morning does not feel that anything needs to be done emergently.  Reassessed pt after speaking with urology and pt has developed some CVA ttp since she has been in the ED. She continues to deny flank pain without palpation. Continues to be nontoxic, nonseptic appearing in NAD. Suprapubic catheter is still draining. Pt will be reassessed in AM.  Discussed pt case with Dr. Fabio Neighbors who will accept the pt into his care.  Advised to start the patient on meropenem.   Final Clinical Impressions(s) / ED Diagnoses   Final diagnoses:  Pyelonephritis  Nephrolithiasis  Abdominal pain, unspecified abdominal location  Pyelitis   Pt has been diagnosed with a Kidney Stone via CT. multiple stones noted on left side. there is bilateral hydronephrosis as well as bilateral pyelitis and right pyelonephritis.  Her urine was brown and turbid, nitrites and leukocytes not reported due to color interference of urine pigment.  On microscopic there are red blood cells, white blood cells, and many bacteria.  Urine likely grossly infected.  Urine culture will be obtained and will initiate meropenem.  CBC with leukocytosis to 13.  CMP with normal creatinine.  Normal lipase.  Urology was consulted and does not feel there is any acute intervention to be done tonight with regards to patient's kidney stones.  However will assess  patient in a.m.  Hospitalist consulted and will admit the patient for further treatment of pyelonephritis and nephrolithiasis.  ED Discharge Orders    None       Rodney Booze, PA-C 09/12/17 0018    Rodney Booze, PA-C 09/12/17 [id number]    Valarie Merino, MD 09/12/17 2325

## 2017-09-11 NOTE — ED Triage Notes (Signed)
Per EMS: Pt is from [facility]. Pt is complaining of abdominal pain. Pt denies N/V and reports having a BM this morning. Pt also reports pain at her catheter. Nothing seems to make the pain better or worse.  Pt has MS and is unable to ambulate CBG 130 BP 118/68 HR 98 RR 16 O2 96% on RA.

## 2017-09-11 NOTE — Progress Notes (Signed)
Pharmacy Antibiotic Note  [name] is a 56 y.o. female admitted on 09/11/2017 with UTI.  Pharmacy has been consulted for meropenem dosing.  Plan: Meropenem 1 Gm IV q8h F/u scr/cultures  Height: 5' 5.5" (166.4 cm) Weight: 143 lb (64.9 kg) IBW/kg (Calculated) : 58.15  Temp (24hrs), Avg:98.7 F (37.1 C), Min:98.7 F (37.1 C), Max:98.7 F (37.1 C)  Recent Labs  Lab 09/11/17 2027  WBC 13.6*  CREATININE 0.83    Estimated Creatinine Clearance: 67.9 mL/min (by C-G formula based on SCr of 0.83 mg/dL).    No Known Allergies  Antimicrobials this admission: 4/1 meropenem >>    >>   Dose adjustments this admission:   Microbiology results:  BCx:   UCx:    Sputum:    MRSA PCR:   Thank you for allowing pharmacy to be a part of this patient's care.  Dorrene German 09/11/2017 11:42 PM

## 2017-09-11 NOTE — ED Notes (Signed)
Bed: WA08 Expected date:  Expected time:  Means of arrival:  Comments: EMS [place] [facility]

## 2017-09-12 DIAGNOSIS — N390 Urinary tract infection, site not specified: Secondary | ICD-10-CM | POA: Diagnosis not present

## 2017-09-12 DIAGNOSIS — E1140 Type 2 diabetes mellitus with diabetic neuropathy, unspecified: Secondary | ICD-10-CM | POA: Diagnosis present

## 2017-09-12 DIAGNOSIS — Z87891 Personal history of nicotine dependence: Secondary | ICD-10-CM | POA: Diagnosis not present

## 2017-09-12 DIAGNOSIS — T83030A Leakage of cystostomy catheter, initial encounter: Secondary | ICD-10-CM | POA: Diagnosis not present

## 2017-09-12 DIAGNOSIS — Z1612 Extended spectrum beta lactamase (ESBL) resistance: Secondary | ICD-10-CM | POA: Diagnosis present

## 2017-09-12 DIAGNOSIS — N183 Chronic kidney disease, stage 3 (moderate): Secondary | ICD-10-CM | POA: Diagnosis present

## 2017-09-12 DIAGNOSIS — R109 Unspecified abdominal pain: Secondary | ICD-10-CM | POA: Diagnosis not present

## 2017-09-12 DIAGNOSIS — I129 Hypertensive chronic kidney disease with stage 1 through stage 4 chronic kidney disease, or unspecified chronic kidney disease: Secondary | ICD-10-CM | POA: Diagnosis present

## 2017-09-12 DIAGNOSIS — Z79899 Other long term (current) drug therapy: Secondary | ICD-10-CM | POA: Diagnosis not present

## 2017-09-12 DIAGNOSIS — Z7984 Long term (current) use of oral hypoglycemic drugs: Secondary | ICD-10-CM | POA: Diagnosis not present

## 2017-09-12 DIAGNOSIS — K219 Gastro-esophageal reflux disease without esophagitis: Secondary | ICD-10-CM | POA: Diagnosis present

## 2017-09-12 DIAGNOSIS — N200 Calculus of kidney: Secondary | ICD-10-CM

## 2017-09-12 DIAGNOSIS — E118 Type 2 diabetes mellitus with unspecified complications: Secondary | ICD-10-CM | POA: Diagnosis not present

## 2017-09-12 DIAGNOSIS — N1330 Unspecified hydronephrosis: Secondary | ICD-10-CM | POA: Diagnosis not present

## 2017-09-12 DIAGNOSIS — I10 Essential (primary) hypertension: Secondary | ICD-10-CM | POA: Diagnosis not present

## 2017-09-12 DIAGNOSIS — G35 Multiple sclerosis: Secondary | ICD-10-CM

## 2017-09-12 DIAGNOSIS — N319 Neuromuscular dysfunction of bladder, unspecified: Secondary | ICD-10-CM | POA: Diagnosis present

## 2017-09-12 DIAGNOSIS — E785 Hyperlipidemia, unspecified: Secondary | ICD-10-CM | POA: Diagnosis present

## 2017-09-12 DIAGNOSIS — E1122 Type 2 diabetes mellitus with diabetic chronic kidney disease: Secondary | ICD-10-CM | POA: Diagnosis present

## 2017-09-12 DIAGNOSIS — D649 Anemia, unspecified: Secondary | ICD-10-CM | POA: Diagnosis not present

## 2017-09-12 DIAGNOSIS — N136 Pyonephrosis: Secondary | ICD-10-CM | POA: Diagnosis present

## 2017-09-12 DIAGNOSIS — Z86718 Personal history of other venous thrombosis and embolism: Secondary | ICD-10-CM | POA: Diagnosis not present

## 2017-09-12 DIAGNOSIS — N12 Tubulo-interstitial nephritis, not specified as acute or chronic: Secondary | ICD-10-CM | POA: Diagnosis not present

## 2017-09-12 DIAGNOSIS — Y846 Urinary catheterization as the cause of abnormal reaction of the patient, or of later complication, without mention of misadventure at the time of the procedure: Secondary | ICD-10-CM | POA: Diagnosis present

## 2017-09-12 DIAGNOSIS — L739 Follicular disorder, unspecified: Secondary | ICD-10-CM | POA: Diagnosis not present

## 2017-09-12 DIAGNOSIS — Z960 Presence of urogenital implants: Secondary | ICD-10-CM

## 2017-09-12 DIAGNOSIS — B9620 Unspecified Escherichia coli [E. coli] as the cause of diseases classified elsewhere: Secondary | ICD-10-CM | POA: Diagnosis present

## 2017-09-12 DIAGNOSIS — B9629 Other Escherichia coli [E. coli] as the cause of diseases classified elsewhere: Secondary | ICD-10-CM | POA: Insufficient documentation

## 2017-09-12 DIAGNOSIS — Z8673 Personal history of transient ischemic attack (TIA), and cerebral infarction without residual deficits: Secondary | ICD-10-CM | POA: Diagnosis not present

## 2017-09-12 DIAGNOSIS — T83510A Infection and inflammatory reaction due to cystostomy catheter, initial encounter: Secondary | ICD-10-CM | POA: Diagnosis present

## 2017-09-12 LAB — BASIC METABOLIC PANEL
Anion gap: 8 (ref 5–15)
BUN: 17 mg/dL (ref 6–20)
CALCIUM: 10 mg/dL (ref 8.9–10.3)
CO2: 22 mmol/L (ref 22–32)
Chloride: 110 mmol/L (ref 101–111)
Creatinine, Ser: 0.84 mg/dL (ref 0.44–1.00)
GFR calc Af Amer: >60 mL/min (ref >60)
GLUCOSE: 199 mg/dL — AB (ref 65–99)
POTASSIUM: 3.9 mmol/L (ref 3.5–5.1)
Sodium: 140 mmol/L (ref 135–145)

## 2017-09-12 LAB — HIV ANTIBODY (ROUTINE TESTING W REFLEX): HIV Screen 4th Generation wRfx: NONREACTIVE

## 2017-09-12 LAB — GLUCOSE, CAPILLARY
GLUCOSE-CAPILLARY: 150 mg/dL — AB (ref 65–99)
Glucose-Capillary: 173 mg/dL — ABNORMAL HIGH (ref 65–99)
Glucose-Capillary: 181 mg/dL — ABNORMAL HIGH (ref 65–99)
Glucose-Capillary: 173 mg/dL — ABNORMAL HIGH (ref 65–99)

## 2017-09-12 LAB — CBC
HCT: 30.8 % — ABNORMAL LOW (ref 36.0–46.0)
Hemoglobin: 9.5 g/dL — ABNORMAL LOW (ref 12.0–15.0)
MCH: 28.7 pg (ref 26.0–34.0)
MCHC: 30.8 g/dL (ref 30.0–36.0)
MCV: 93.1 fL (ref 78.0–100.0)
PLATELETS: 461 10*3/uL — AB (ref 150–400)
RBC: 3.31 MIL/uL — AB (ref 3.87–5.11)
RDW: 17.2 % — ABNORMAL HIGH (ref 11.5–15.5)
WBC: 12.2 10*3/uL — AB (ref 4.0–10.5)

## 2017-09-12 LAB — MRSA PCR SCREENING: MRSA BY PCR: NEGATIVE

## 2017-09-12 MED ORDER — GABAPENTIN 100 MG PO CAPS
100.0000 mg | ORAL_CAPSULE | Freq: Every day | ORAL | Status: DC
  Administered 2017-09-12 – 2017-09-18 (×8): 100 mg via ORAL
  Filled 2017-09-12 (×8): qty 1

## 2017-09-12 MED ORDER — ENOXAPARIN SODIUM 40 MG/0.4ML ~~LOC~~ SOLN
40.0000 mg | SUBCUTANEOUS | Status: DC
  Administered 2017-09-12 – 2017-09-19 (×7): 40 mg via SUBCUTANEOUS
  Filled 2017-09-12 (×8): qty 0.4

## 2017-09-12 MED ORDER — INSULIN ASPART 100 UNIT/ML ~~LOC~~ SOLN
0.0000 [IU] | Freq: Three times a day (TID) | SUBCUTANEOUS | Status: DC
  Administered 2017-09-12 (×3): 3 [IU] via SUBCUTANEOUS

## 2017-09-12 MED ORDER — FAMOTIDINE 20 MG PO TABS
20.0000 mg | ORAL_TABLET | Freq: Two times a day (BID) | ORAL | Status: DC
  Administered 2017-09-12 – 2017-09-19 (×15): 20 mg via ORAL
  Filled 2017-09-12 (×15): qty 1

## 2017-09-12 MED ORDER — FERROUS SULFATE 325 (65 FE) MG PO TABS
325.0000 mg | ORAL_TABLET | Freq: Two times a day (BID) | ORAL | Status: DC
  Administered 2017-09-12 – 2017-09-19 (×15): 325 mg via ORAL
  Filled 2017-09-12 (×15): qty 1

## 2017-09-12 MED ORDER — POLYETHYLENE GLYCOL 3350 17 G PO PACK
17.0000 g | PACK | Freq: Two times a day (BID) | ORAL | Status: DC
  Administered 2017-09-12 – 2017-09-17 (×4): 17 g via ORAL
  Filled 2017-09-12 (×11): qty 1

## 2017-09-12 MED ORDER — ONDANSETRON HCL 4 MG PO TABS: 4.0000 mg | ORAL_TABLET | Freq: Four times a day (QID) | ORAL | Status: DC | PRN

## 2017-09-12 MED ORDER — ADULT MULTIVITAMIN W/MINERALS CH
1.0000 | ORAL_TABLET | Freq: Every day | ORAL | Status: DC
  Administered 2017-09-12 – 2017-09-19 (×7): 1 via ORAL
  Filled 2017-09-12 (×7): qty 1

## 2017-09-12 MED ORDER — ACETAMINOPHEN 325 MG PO TABS: 650.0000 mg | ORAL_TABLET | Freq: Four times a day (QID) | ORAL | Status: DC | PRN

## 2017-09-12 MED ORDER — POTASSIUM CHLORIDE CRYS ER 10 MEQ PO TBCR
10.0000 meq | EXTENDED_RELEASE_TABLET | Freq: Every day | ORAL | Status: DC
  Administered 2017-09-12 – 2017-09-19 (×7): 10 meq via ORAL
  Filled 2017-09-12 (×7): qty 1

## 2017-09-12 MED ORDER — OXYBUTYNIN CHLORIDE 5 MG PO TABS
5.0000 mg | ORAL_TABLET | Freq: Two times a day (BID) | ORAL | Status: DC | PRN
  Administered 2017-09-12: 5 mg via ORAL
  Filled 2017-09-12: qty 1

## 2017-09-12 MED ORDER — ACETAMINOPHEN 325 MG PO TABS
650.0000 mg | ORAL_TABLET | Freq: Four times a day (QID) | ORAL | Status: DC | PRN
Start: 2017-09-12 — End: 2017-09-19
  Administered 2017-09-12: 650 mg via ORAL
  Filled 2017-09-12: qty 2

## 2017-09-12 MED ORDER — ATORVASTATIN CALCIUM 10 MG PO TABS
10.0000 mg | ORAL_TABLET | Freq: Every day | ORAL | Status: DC
  Administered 2017-09-12 – 2017-09-18 (×8): 10 mg via ORAL
  Filled 2017-09-12 (×8): qty 1

## 2017-09-12 MED ORDER — LISINOPRIL 5 MG PO TABS
2.5000 mg | ORAL_TABLET | Freq: Every day | ORAL | Status: DC
  Administered 2017-09-12 – 2017-09-19 (×7): 2.5 mg via ORAL
  Filled 2017-09-12 (×9): qty 1

## 2017-09-12 MED ORDER — MAGNESIUM OXIDE 400 (241.3 MG) MG PO TABS
400.0000 mg | ORAL_TABLET | Freq: Every day | ORAL | Status: DC
  Administered 2017-09-12 – 2017-09-19 (×7): 400 mg via ORAL
  Filled 2017-09-12 (×7): qty 1

## 2017-09-12 MED ORDER — ACETAMINOPHEN 650 MG RE SUPP: 650.0000 mg | Freq: Four times a day (QID) | RECTAL | Status: DC | PRN

## 2017-09-12 MED ORDER — ALBUTEROL SULFATE (2.5 MG/3ML) 0.083% IN NEBU
2.5000 mg | INHALATION_SOLUTION | Freq: Four times a day (QID) | RESPIRATORY_TRACT | Status: DC | PRN
Start: 2017-09-12 — End: 2017-09-19

## 2017-09-12 MED ORDER — ONDANSETRON HCL 4 MG/2ML IJ SOLN: 4.0000 mg | Freq: Four times a day (QID) | INTRAMUSCULAR | Status: DC | PRN

## 2017-09-12 NOTE — H&P (Signed)
History and Physical    [name] DXA:[id number] DOB: [birth year] DOA: 09/11/2017  PCP: Patient, [name]  Patient coming from: [place] have personally briefly reviewed patient's old medical records in [place]  Chief Complaint: Abd pain  HPI: [name] is a 57 y.o. female with medical history significant of MS, neurogenic bladder, non-ambulatory at baseline in SNF, DM2, HTN, UTI with ESBL e.coli in Mar 2018.  Patient presents to the ED with c/o abd pain, pain at site of suprapubic catheter.  Nothing makes pain better or worse.  Pain onset about 1 month ago, intermittent but worsened over past couple of days.  No radiation to back.  No N/V/D.  No fevers.   ED Course: Urine is highly suspicious for UTI (urine is frankly purulent on evaluation).  Patient started on Merrem.   Review of Systems: As per HPI otherwise 10 point review of systems negative.   Past Medical History:  Diagnosis Date  . Acute parametritis and pelvic cellulitis   . Acute renal failure superimposed on stage 3 chronic kidney disease ([place]) 07/21/2015  . Anemia    chronic   . Contracture, right hand   . Diabetes mellitus without complication ([place])   . Diabetic neuropathy ([place])   . DVT (deep venous thrombosis) ([place])   . DVT (deep venous thrombosis) ([place])   . GERD (gastroesophageal reflux disease)   . History of kidney stones   . Hx of sepsis   . Hyperlipidemia   . Hypertension   . Hypokalemia   . Hypomagnesemia   . Left nephrolithiasis   . Leukocytosis   . Lymph edema    chronic   . Malnutrition of moderate degree 07/21/2015  . Multiple sclerosis diagnosed 2002-not on Therapy any longer 06/26/2012  . Muscle weakness (generalized)   . Neuromuscular disorder ([place])   . Neuromuscular dysfunction of bladder   . Polyneuropathy   . Presence of indwelling urinary catheter   . Protein calorie malnutrition ([place])   . Stage 4 decubitus ulcer ([place]) 07/05/2015  . Stroke ([place])   . UTI (lower  urinary tract infection)     Past Surgical History:  Procedure Laterality Date  . COLONOSCOPY WITH PROPOFOL N/A 07/25/2016   Procedure: COLONOSCOPY WITH PROPOFOL;  Surgeon: Laurence Spates, MD;  Location: [facility];  Service: Endoscopy;  Laterality: N/A;  . ESOPHAGOGASTRODUODENOSCOPY Left 01/02/2015   Procedure: ESOPHAGOGASTRODUODENOSCOPY (EGD);  Surgeon: Arta Silence, MD;  Location: [facility];  Service: Endoscopy;  Laterality: Left;  . ESOPHAGOGASTRODUODENOSCOPY (EGD) WITH PROPOFOL N/A 07/25/2016   Procedure: ESOPHAGOGASTRODUODENOSCOPY (EGD) WITH PROPOFOL;  Surgeon: Laurence Spates, MD;  [facility]: [facility];  Service: Endoscopy;  Laterality: N/A;  . HERNIA MESH REMOVAL    . HOLMIUM LASER APPLICATION Left 2/0/9470   Procedure: HOLMIUM LASER APPLICATION;  Surgeon: Franchot Gallo, MD;  Location: [facility];  Service: Urology;  Laterality: Left;  . IR CATHETER TUBE CHANGE  10/06/2016  . IR CATHETER TUBE CHANGE  11/17/2016  . IR CATHETER TUBE CHANGE  02/10/2017  . IR GENERIC HISTORICAL  02/23/2016   IR NEPHROSTOMY EXCHANGE LEFT 02/23/2016 Sandi Mariscal, MD [facility]  . IR GENERIC HISTORICAL  04/19/2016   IR NEPHROSTOMY EXCHANGE LEFT 04/19/2016 Arne Cleveland, MD [facility]  . IR GENERIC HISTORICAL  05/13/2016   IR NEPHROSTOMY EXCHANGE LEFT 05/13/2016 Corrie Mckusick, DO [facility]  . IR GENERIC HISTORICAL  05/13/2016   IR NEPHROSTOMY PLACEMENT RIGHT 05/13/2016 Corrie Mckusick, DO [facility]  . IR GENERIC  HISTORICAL  07/07/2016   IR NEPHROSTOMY EXCHANGE RIGHT 07/07/2016 Jacqulynn Cadet, MD [facility]  . IR GENERIC HISTORICAL  07/07/2016   IR NEPHROSTOMY EXCHANGE LEFT 07/07/2016 Jacqulynn Cadet, MD [facility]  . IR NEPHROSTOGRAM LEFT THRU EXISTING ACCESS  12/28/2016  . IR NEPHROSTOGRAM RIGHT THRU EXISTING ACCESS  12/28/2016  . IR NEPHROSTOMY EXCHANGE LEFT  10/06/2016  . IR NEPHROSTOMY EXCHANGE LEFT  11/17/2016  . IR NEPHROSTOMY EXCHANGE RIGHT  10/06/2016  . IR NEPHROSTOMY EXCHANGE RIGHT   11/17/2016  . NEPHROLITHOTOMY Left 08/18/2016   Procedure: NEPHROLITHOTOMY LEFT PERCUTANEOUS;  Surgeon: Franchot Gallo, MD;  Location[facility];  Service: Urology;  Laterality: Left;  . NEPHROSTOMY TUBE PLACEMENT ([place] HX)    . TRANSURETHRAL RESECTION OF BLADDER TUMOR N/A 03/16/2016   Procedure: CYSTOSCOPY BLADDER BIOPSY;  Surgeon: Franchot Gallo, MD;  Location[facility];  Service: Urology;  Laterality: N/A;  . TUBAL LIGATION    . tubes tided       reports that she quit smoking about 2 years ago. Her smoking use included cigarettes. She has a 20.00 pack-year smoking history. She has never used smokeless tobacco. She reports that she drinks about 3.0 oz of alcohol per week. She reports that she does not use drugs.  No Known Allergies  Family History  Problem Relation Age of Onset  . Diabetes Mother   . Alzheimer's disease Mother   . Diabetes Father   . Diabetes Sister   . Scoliosis Sister      Prior to Admission medications   Medication Sig Start Date End Date Taking? Authorizing Provider  acetaminophen (TYLENOL) 325 MG tablet Take 2 tablets (650 mg total) by mouth every 6 (six) hours as needed for fever. 03/20/16  Yes Bonnielee Haff, MD  atorvastatin (LIPITOR) 10 MG tablet Take 10 mg by mouth at bedtime.    Yes [provider]  famotidine (PEPCID) 20 MG tablet Take 1 tablet (20 mg total) by mouth 2 (two) times daily. 11/18/15  Yes Cherene Altes, MD  ferrous sulfate 325 (65 FE) MG EC tablet Take 325 mg by mouth 2 (two) times daily.   Yes [provider]  gabapentin (NEURONTIN) 100 MG capsule Take 1 capsule (100 mg total) by mouth at bedtime. 11/18/15  Yes Cherene Altes, MD  glipiZIDE (GLUCOTROL) 2.5 mg TABS tablet Take 2.5 mg by mouth daily before breakfast.   Yes [provider]  lisinopril (PRINIVIL,ZESTRIL) 2.5 MG tablet Take 2.5 mg by mouth daily.  11/30/15  Yes [provider]  Magnesium 400 MG TABS Take 400 mg by mouth daily.   Yes [provider]  metFORMIN (GLUCOPHAGE) 500 MG tablet Take 0.5 tablets (250 mg total) by mouth 2 (two) times daily. Patient taking differently: Take 500 mg by mouth 2 (two) times daily.  04/22/16  Yes Ngetich, Dinah C, NP  Multiple Vitamin (MULTIVITAMIN WITH MINERALS) TABS tablet Take 1 tablet by mouth daily. 07/09/15  Yes Florencia Reasons, MD  polyethylene glycol [facility] Floria Raveling) packet Take 17 g by mouth 2 (two) times daily. 11/18/15  Yes Cherene Altes, MD  potassium chloride (K-DUR,KLOR-CON) 10 MEQ tablet Take 1 tablet (10 mEq total) by mouth daily. 03/21/16  Yes Bonnielee Haff, MD  albuterol (PROVENTIL) (2.5 MG/3ML) 0.083% nebulizer solution Take 3 mLs (2.5 mg total) by nebulization every 6 (six) hours as needed for wheezing or shortness of breath. 12/30/15   Robbie Lis, MD  oxybutynin (DITROPAN) 5 MG tablet Take 5 mg by mouth  2 (two) times daily as needed for bladder spasms.    [provider]    Physical Exam: Vitals:   09/11/17 1944 09/11/17 1945 09/11/17 2134 09/12/17 0018  BP: 123/70 123/70  118/80  Pulse: 89 88  91  Resp:  16  18  Temp:  98.7 F (37.1 C)  98.4 F (36.9 C)  TempSrc:  Oral  Oral  SpO2: 99% 99%  99%  Weight:   64.9 kg (143 lb)   Height:   5' 5.5" (1.664 m)     Constitutional: NAD, calm, comfortable Eyes: PERRL, lids and conjunctivae normal ENMT: Mucous membranes are moist. Posterior pharynx clear of any exudate or lesions.Normal dentition.  Neck: normal, supple, no masses, no thyromegaly Respiratory: clear to auscultation bilaterally, no wheezing, no crackles. Normal respiratory effort. No accessory muscle use.  Cardiovascular: Regular rate and rhythm, no murmurs / rubs / gallops. No extremity edema. 2+ pedal pulses. No carotid bruits.  Abdomen: Suprapubic tenderness. Musculoskeletal: no clubbing / cyanosis. No joint deformity upper and lower extremities. Good ROM, no contractures. Normal muscle tone.  Skin: no rashes, lesions, ulcers. No  induration Neurologic: Baseline BLE weakness Psychiatric: Normal judgment and insight. Alert and oriented x 3. Normal mood.    Labs on Admission: I have personally reviewed following labs and imaging studies  CBC: Recent Labs  Lab 09/11/17 2027  WBC 13.6*  HGB 10.4*  HCT 33.3*  MCV 93.3  PLT 419*   Basic Metabolic Panel: Recent Labs  Lab 09/11/17 2027  NA 140  K 4.4  CL 107  CO2 21*  GLUCOSE 162*  BUN 16  CREATININE 0.83  CALCIUM 10.5*   GFR: Estimated Creatinine Clearance: 67.9 mL/min (by C-G formula based on SCr of 0.83 mg/dL). Liver Function Tests: Recent Labs  Lab 09/11/17 2027  AST 16  ALT 15  ALKPHOS 74  BILITOT 0.4  PROT 8.5*  ALBUMIN 3.6   Recent Labs  Lab 09/11/17 2027  LIPASE 43   No results for input(s): AMMONIA in the last 168 hours. Coagulation Profile: No results for input(s): INR, PROTIME in the last 168 hours. Cardiac Enzymes: No results for input(s): CKTOTAL, CKMB, CKMBINDEX, TROPONINI in the last 168 hours. BNP (last 3 results) No results for input(s): PROBNP in the last 8760 hours. HbA1C: No results for input(s): HGBA1C in the last 72 hours. CBG: No results for input(s): GLUCAP in the last 168 hours. Lipid Profile: No results for input(s): CHOL, HDL, LDLCALC, TRIG, CHOLHDL, LDLDIRECT in the last 72 hours. Thyroid Function Tests: No results for input(s): TSH, T4TOTAL, FREET4, T3FREE, THYROIDAB in the last 72 hours. Anemia Panel: No results for input(s): VITAMINB12, FOLATE, FERRITIN, TIBC, IRON, RETICCTPCT in the last 72 hours. Urine analysis:    Component Value Date/Time   COLORURINE BROWN (A) 09/11/2017 1942   APPEARANCEUR TURBID (A) 09/11/2017 1942   LABSPEC  09/11/2017 1942    TEST NOT REPORTED DUE TO COLOR INTERFERENCE OF URINE PIGMENT   PHURINE  09/11/2017 1942    TEST NOT REPORTED DUE TO COLOR INTERFERENCE OF URINE PIGMENT   GLUCOSEU (A) 09/11/2017 1942    TEST NOT REPORTED DUE TO COLOR INTERFERENCE OF URINE PIGMENT    HGBUR (A) 09/11/2017 1942    TEST NOT REPORTED DUE TO COLOR INTERFERENCE OF URINE PIGMENT   BILIRUBINUR (A) 09/11/2017 1942    TEST NOT REPORTED DUE TO COLOR INTERFERENCE OF URINE PIGMENT   KETONESUR (A) 09/11/2017 1942    TEST NOT REPORTED DUE TO COLOR INTERFERENCE  OF URINE PIGMENT   PROTEINUR (A) 09/11/2017 1942    TEST NOT REPORTED DUE TO COLOR INTERFERENCE OF URINE PIGMENT   UROBILINOGEN 0.2 12/20/2014 1851   NITRITE (A) 09/11/2017 1942    TEST NOT REPORTED DUE TO COLOR INTERFERENCE OF URINE PIGMENT   LEUKOCYTESUR (A) 09/11/2017 1942    TEST NOT REPORTED DUE TO COLOR INTERFERENCE OF URINE PIGMENT    Radiological Exams on Admission: Ct Abdomen Pelvis W Contrast  Result Date: 09/11/2017 CLINICAL DATA:  Abdominal pain at catheter site. History of multiple sclerosis, decubitus ulcers, stroke. EXAM: CT ABDOMEN AND PELVIS WITH CONTRAST TECHNIQUE: Multidetector CT imaging of the abdomen and pelvis was performed using the standard protocol following bolus administration of intravenous contrast. CONTRAST:  144mL ISOVUE-300 IOPAMIDOL (ISOVUE-300) INJECTION 61% COMPARISON:  CT abdomen and pelvis July 26, 2017 FINDINGS: LOWER CHEST: Dependent atelectasis. Included heart size is normal. No pericardial effusion. HEPATOBILIARY: Liver and gallbladder are normal. PANCREAS: Normal. SPLEEN: Normal. ADRENALS/URINARY TRACT: Kidneys orthotopic. Striated enhancement RIGHT kidney with moderate hydronephrosis and urothelial enhancement. Interval removal of nephrostomy tube with scarring. Moderate LEFT hydronephrosis with multiple parapelvic cyst and urothelial enhancement. Multiple LEFT nephrolithiasis measuring to 3 mm. LEFT greater than RIGHT periureteral fat stranding. 4 mm distal LEFT ureteral calculus; dense, likely debris distended LEFT ureter. Interval LEFT nephrostomy tube. Urinary bladder decompressed with indwelling suprapubic catheter. No inflammatory changes or focal fluid collection about the catheter  tract. STOMACH/BOWEL: The stomach, small and large bowel are normal in course and caliber without inflammatory changes. Normal appendix. VASCULAR/LYMPHATIC: Aortoiliac vessels are normal in course and caliber. Mild intimal thickening calcific atherosclerosis. No lymphadenopathy by CT size criteria. REPRODUCTIVE: Lobulated heterogeneously enhancing uterus most compatible with leiomyomas. OTHER: No intraperitoneal free fluid or free air. MUSCULOSKELETAL: Nonacute. Anterior abdominal wall ligamentous laxity. Osteopenia. Generalized muscle atrophy. Scarring extending to ischial tuberosities compatible with old decubitus ulcers without focal fluid collection. IMPRESSION: 1. 4 mm distal LEFT ureteral calculus. Multiple LEFT nephrolithiasis measuring to 3 mm. 2. Bilateral pyelitis and RIGHT pyelonephritis. 3. Moderate bilateral hydronephrosis, status post interval removal of nephrostomy tubes. Aortic Atherosclerosis (ICD10-I70.0). Electronically Signed   By: Elon Alas M.D.   On: 09/11/2017 22:32    EKG: Independently reviewed.  Assessment/Plan Principal Problem:   UTI due to extended-spectrum beta lactamase (ESBL) producing Escherichia coli Active Problems:   Multiple sclerosis diagnosed 2002-not on Therapy any longer   Chronic indwelling Foley catheter   Neurogenic bladder   Controlled diabetes mellitus type 2 with complications ([place])   Essential hypertension    1. CAUTI - presumed to be due to ESBL given history of same until proven otherwise by culture. 1. Merrem empirically 2. UCx pending 3. Repeat CBC/BMP in AM 4. No fever, WBC 13k, no other signs of sepsis. 5. Given CT findings (moderate B hydro, B pyelitis, R pyelonephritis, L nephrolithiasis), Dr. Pilar Jarvis consulted by EDP 1. Not feel need for emergent intervention to be done tonight. 2. Will assess in AM. 2. MS - 1. With Neurogenic bladder and non-ambulatory 2. Not on therapy any longer 3. DM2 - 1. Holding metformin and  glipizide 2. Mod scale SSI AC 4. HTN - continue lisinopril  DVT prophylaxis: Lovenox Code Status: Full Family Communication: No family in room Disposition Plan: return to SNF when cultures demonstrate not ESBL, or after completing treatment with Merrem for ESBL. Consults called: Dr. Pilar Jarvis with Urology called by EDP Admission status: Admit to inpatient - inpatient status for Merrem treatment of CAUTI with h/o ESBL e.coli.  Etta Quill DO [facility] Pager [phone]  If 7AM-7PM, please contact day team taking care of patient [email or web address] Password [facility]  09/12/2017, 12:28 AM

## 2017-09-12 NOTE — NC FL2 (Signed)
[place] LEVEL OF CARE SCREENING TOOL     IDENTIFICATION  Patient Name: [name] Birthdate: 1960-11-09 Sex: female Admission Date (Current Location): 09/11/2017  [place] and [place] Number:  [name] [id number] [place] and Address:  [facility]  [place] [address], [place]      Provider Number: [id number]  Attending Physician Name and Address:  Kayleen Memos, DO  Relative Name and Phone Number:       Current Level of Care: Hospital Recommended Level of Care: [place] Prior Approval Number:    Date Approved/Denied:   PASRR Number: [id number]  Discharge Plan: SNF    Current Diagnoses: Patient Active Problem List   Diagnosis Date Noted  . UTI due to extended-spectrum beta lactamase (ESBL) producing Escherichia coli 09/12/2017  . Renal calculus 08/15/2016  . Kidney stone 08/15/2016  . Acute metabolic encephalopathy 28/31/5176  . Essential hypertension 07/19/2016  . Hydronephrosis   . Pelvic abscess in female 03/17/2016  . Septic shock ([place]) 03/08/2016  . Neuropathic pain 01/11/2016  . Left nephrolithiasis 12/22/2015  . Pressure ulcer   . Controlled diabetes mellitus type 2 with complications ([place])   . AKI (acute kidney injury) ([place]) 11/11/2015  . Neurogenic bladder 11/11/2015  . Physical deconditioning 11/11/2015  . Chronic indwelling Foley catheter 09/30/2015  . Dyslipidemia associated with type 2 diabetes mellitus ([place]) 08/28/2015  . GERD without esophagitis 08/28/2015  . Anemia 07/21/2015  . Malnutrition of moderate degree 07/21/2015  . DVT, lower extremity ([place]) 12/28/2014  . Multiple sclerosis diagnosed 2002-not on Therapy any longer 06/26/2012    Orientation RESPIRATION BLADDER Height & Weight     Self, Time, Situation, Place  Normal Indwelling catheter Weight: 140 lb 9.8 oz (63.8 kg) Height:  5' 5.5" (166.4 cm)  BEHAVIORAL SYMPTOMS/MOOD NEUROLOGICAL BOWEL NUTRITION STATUS   Continent Diet(carb modified diet)  AMBULATORY STATUS COMMUNICATION OF NEEDS Skin   Extensive Assist Verbally Normal                       Personal Care Assistance Level of Assistance  Bathing, Feeding, Dressing Bathing Assistance: Maximum assistance Feeding assistance: Independent Dressing Assistance: Maximum assistance     Functional Limitations Info  Sight, Hearing, Speech Sight Info: Adequate Hearing Info: Adequate Speech Info: Adequate    SPECIAL CARE FACTORS FREQUENCY                       Contractures      Additional Factors Info  Code Status, Allergies, Isolation Precautions Code Status Info: full code Allergies Info: nka     Isolation Precautions Info: contact precautions-ESBL     Current Medications (09/12/2017):  This is the current hospital active medication list Current Facility-Administered Medications  Medication Dose Route Frequency Provider Last Rate Last Dose  . acetaminophen (TYLENOL) tablet 650 mg  650 mg Oral Q6H PRN Etta Quill, DO   650 mg at 09/12/17 0235   Or  . acetaminophen (TYLENOL) suppository 650 mg  650 mg Rectal Q6H PRN Etta Quill, DO      . albuterol (PROVENTIL) (2.5 MG/3ML) 0.083% nebulizer solution 2.5 mg  2.5 mg Nebulization Q6H PRN Etta Quill, DO      . atorvastatin (LIPITOR) tablet 10 mg  10 mg Oral QHS Jennette Kettle M, DO   10 mg at 09/12/17 0235  . enoxaparin (LOVENOX) injection 40 mg  40 mg Subcutaneous Q24H Etta Quill, DO  40 mg at 09/12/17 0834  . famotidine (PEPCID) tablet 20 mg  20 mg Oral BID Jennette Kettle M, DO   20 mg at 09/12/17 4010  . ferrous sulfate tablet 325 mg  325 mg Oral BID Etta Quill, DO   325 mg at 09/12/17 2725  . gabapentin (NEURONTIN) capsule 100 mg  100 mg Oral QHS Jennette Kettle M, DO   100 mg at 09/12/17 0235  . insulin aspart (novoLOG) injection 0-15 Units  0-15 Units Subcutaneous TID WC Etta Quill, DO   3 Units at 09/12/17 1325  . lisinopril  (PRINIVIL,ZESTRIL) tablet 2.5 mg  2.5 mg Oral Daily Jennette Kettle M, DO   2.5 mg at 09/12/17 0835  . magnesium oxide (MAG-OX) tablet 400 mg  400 mg Oral Daily Jennette Kettle M, DO   400 mg at 09/12/17 0835  . meropenem (MERREM) 1 g in sodium chloride 0.9 % 100 mL IVPB  1 g Intravenous Q8H Dorrene German, [place]   Stopped at 09/12/17 3664  . multivitamin with minerals tablet 1 tablet  1 tablet Oral Daily Jennette Kettle M, DO   1 tablet at 09/12/17 [phone]  . ondansetron (ZOFRAN) tablet 4 mg  4 mg Oral Q6H PRN Etta Quill, DO       Or  . ondansetron [facility]) injection 4 mg  4 mg Intravenous Q6H PRN Etta Quill, DO      . oxybutynin (DITROPAN) tablet 5 mg  5 mg Oral BID PRN Etta Quill, DO   5 mg at 09/12/17 0235  . polyethylene glycol (MIRALAX / GLYCOLAX) packet 17 g  17 g Oral BID Jennette Kettle M, DO   17 g at 09/12/17 0836  . potassium chloride (K-DUR,KLOR-CON) CR tablet 10 mEq  10 mEq Oral Daily Jennette Kettle M, DO   10 mEq at 09/12/17 7425   Facility-Administered Medications Ordered in Other Encounters  Medication Dose Route Frequency Provider Last Rate Last Dose  . 0.9 %  sodium chloride infusion  250 mL Intravenous PRN Franchot Gallo, MD      . enoxaparin (LOVENOX) injection 40 mg  40 mg Subcutaneous Q24H Franchot Gallo, MD      . sodium chloride flush (NS) 0.9 % injection 3 mL  3 mL Intravenous Q12H Franchot Gallo, MD      . sodium chloride flush (NS) 0.9 % injection 3 mL  3 mL Intravenous PRN Franchot Gallo, MD         Discharge Medications: Please see discharge summary for a list of discharge medications.  Relevant Imaging Results:  Relevant Lab Results:   Additional Information SS [id number]  Nila Nephew, LCSW

## 2017-09-12 NOTE — ED Notes (Signed)
Gave report to Vanita Ingles, Therapist, sports for room [phone].

## 2017-09-12 NOTE — Progress Notes (Signed)
Pt arrived to unit room 1519 via stretcher. VS taken and callbell within reach. Pt alert and oriented x4. General weakness, R hand contracted. Pain 7/10 in abdomen. Initial assessment completed. Will continue to monitor

## 2017-09-12 NOTE — Consult Note (Signed)
09/12/2017 7:45 AM   [name] 1960/07/29 [id number]  Referring provider: Dr. Jennette Kettle  CC: Suprapubic pain, right ureteral stones  HPI: The patient is a 57 year old female with a complex history including significant nephrolithiasis requiring PCNL, MS, neurogenic bladder requiring suprapubic catheter, and multiple other medical comorbidities who presents the hospital with suprapubic pain.  The patient states that she has been experiencing suprapubic pain around her SP tube for the last month however it worsened over the last few days.  She has no flank pain.  She has no fevers or chills.  She has no nausea or vomiting.  Her still complaint is this pain around her SP tube.  She was found to have bilateral hydroureteronephrosis down to the level of the bladder.  She also had a 3 and 4 mm left ureteral stone with dilation of the ureter distal to the stones.  She also nonobstructing Left nephrolithiasis.  Urology was consulted for further recommendations regarding this.  She was admitted for a presumed urinary tract infection from a culture taken from her SP tube.  Again she has no flank pain.  She has been afebrile with stable vital signs.  Her kidney function is at baseline.  She has no significant white blood cell count.  She has no signs of sepsis.  The patient does have a history of requiring nephrostomy tubes and bilateral percutaneous nephrolithotomy last year.  Her nephrostomy tubes were removed in December 2018.   PMH: Past Medical History:  Diagnosis Date  . Acute parametritis and pelvic cellulitis   . Acute renal failure superimposed on stage 3 chronic kidney disease ([place]) 07/21/2015  . Anemia    chronic   . Contracture, right hand   . Diabetes mellitus without complication ([place])   . Diabetic neuropathy ([place])   . DVT (deep venous thrombosis) ([place])   . DVT (deep venous thrombosis) ([place])   . GERD (gastroesophageal reflux disease)   . History of kidney stones   . Hx of sepsis    . Hyperlipidemia   . Hypertension   . Hypokalemia   . Hypomagnesemia   . Left nephrolithiasis   . Leukocytosis   . Lymph edema    chronic   . Malnutrition of moderate degree 07/21/2015  . Multiple sclerosis diagnosed 2002-not on Therapy any longer 06/26/2012  . Muscle weakness (generalized)   . Neuromuscular disorder ([place])   . Neuromuscular dysfunction of bladder   . Polyneuropathy   . Presence of indwelling urinary catheter   . Protein calorie malnutrition ([place])   . Stage 4 decubitus ulcer ([place]) 07/05/2015  . Stroke ([place])   . UTI (lower urinary tract infection)     Surgical History: Past Surgical History:  Procedure Laterality Date  . COLONOSCOPY WITH PROPOFOL N/A 07/25/2016   Procedure: COLONOSCOPY WITH PROPOFOL;  Surgeon: Laurence Spates, MD;  Location: [facility];  Service: Endoscopy;  Laterality: N/A;  . ESOPHAGOGASTRODUODENOSCOPY Left 01/02/2015   Procedure: ESOPHAGOGASTRODUODENOSCOPY (EGD);  Surgeon: Arta Silence, MD;  Location: [facility];  Service: Endoscopy;  Laterality: Left;  . ESOPHAGOGASTRODUODENOSCOPY (EGD) WITH PROPOFOL N/A 07/25/2016   Procedure: ESOPHAGOGASTRODUODENOSCOPY (EGD) WITH PROPOFOL;  Surgeon: Laurence Spates, MD;  [facility];  Service: Endoscopy;  Laterality: N/A;  . HERNIA MESH REMOVAL    . HOLMIUM LASER APPLICATION Left 9/0/3009   Procedure: HOLMIUM LASER APPLICATION;  Surgeon: Franchot Gallo, MD;  Location: [facility];  Service: Urology;  Laterality: Left;  . IR CATHETER TUBE CHANGE  10/06/2016  . IR CATHETER TUBE  CHANGE  11/17/2016  . IR CATHETER TUBE CHANGE  02/10/2017  . IR GENERIC HISTORICAL  02/23/2016   IR NEPHROSTOMY EXCHANGE LEFT 02/23/2016 Sandi Mariscal, MD [facility]  . IR GENERIC HISTORICAL  04/19/2016   IR NEPHROSTOMY EXCHANGE LEFT 04/19/2016 Arne Cleveland, MD [facility]  . IR GENERIC HISTORICAL  05/13/2016   IR NEPHROSTOMY EXCHANGE LEFT 05/13/2016 Corrie Mckusick, DO [facility]  . IR GENERIC HISTORICAL  05/13/2016   IR  NEPHROSTOMY PLACEMENT RIGHT 05/13/2016 Corrie Mckusick, DO [facility]  . IR GENERIC HISTORICAL  07/07/2016   IR NEPHROSTOMY EXCHANGE RIGHT 07/07/2016 Jacqulynn Cadet, MD [facility]  . IR GENERIC HISTORICAL  07/07/2016   IR NEPHROSTOMY EXCHANGE LEFT 07/07/2016 Jacqulynn Cadet, MD [facility]  . IR NEPHROSTOGRAM LEFT THRU EXISTING ACCESS  12/28/2016  . IR NEPHROSTOGRAM RIGHT THRU EXISTING ACCESS  12/28/2016  . IR NEPHROSTOMY EXCHANGE LEFT  10/06/2016  . IR NEPHROSTOMY EXCHANGE LEFT  11/17/2016  . IR NEPHROSTOMY EXCHANGE RIGHT  10/06/2016  . IR NEPHROSTOMY EXCHANGE RIGHT  11/17/2016  . NEPHROLITHOTOMY Left 08/18/2016   Procedure: NEPHROLITHOTOMY LEFT PERCUTANEOUS;  Surgeon: Franchot Gallo, MD;  [facility];  Service: Urology;  Laterality: Left;  . NEPHROSTOMY TUBE PLACEMENT ([place] [facility]X)    . TRANSURETHRAL RESECTION OF BLADDER TUMOR N/A 03/16/2016   Procedure: CYSTOSCOPY BLADDER BIOPSY;  Surgeon: Franchot Gallo, MD;  [facility];  Service: Urology;  Laterality: N/A;  . TUBAL LIGATION    . tubes tided       Allergies: No Known Allergies  Family History: Family History  Problem Relation Age of Onset  . Diabetes Mother   . Alzheimer's disease Mother   . Diabetes Father   . Diabetes Sister   . Scoliosis Sister     Social History:  reports that she quit smoking about 2 years ago. Her smoking use included cigarettes. She has a 20.00 pack-year smoking history. She has never used smokeless tobacco. She reports that she drinks about 3.0 oz of alcohol per week. She reports that she does not use drugs.  ROS: 12 point ROS negative except for above  Physical Exam: BP 122/89 (BP Location: Right Arm)   Pulse 87   Temp 98.7 F (37.1 C) (Oral)   Resp 19   Ht 5' 5.5" (1.664 m)   Wt 140 lb 9.8 oz (63.8 kg)   SpO2 97%   BMI 23.04 kg/m   Constitutional:  Alert and oriented, No acute distress. HEENT: [place] AT, moist mucus membranes.  Trachea midline, no masses. Cardiovascular: No clubbing,  cyanosis, or edema. Respiratory: Normal respiratory effort, no increased work of breathing. GI: Abdomen is soft, nontender, nondistended, no abdominal masses GU: No CVA tenderness.  SP tube in place.  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 Skin: No rashes, bruises or suspicious lesions. Lymph: No cervical or inguinal adenopathy. Neurologic: Grossly intact, no focal deficits, moving all 4 extremities. Psychiatric: Normal mood and affect.  Laboratory Data: Lab Results  Component Value Date   WBC 12.2 (H) 09/12/2017   HGB 9.5 (L) 09/12/2017   HCT 30.8 (L) 09/12/2017   MCV 93.1 09/12/2017   PLT 461 (H) 09/12/2017    Lab Results  Component Value Date   CREATININE 0.84 09/12/2017    No results found for: PSA  No results found for: TESTOSTERONE  Lab Results  Component Value Date   HGBA1C 5.3 07/15/2016    Urinalysis    Component Value Date/Time   COLORURINE BROWN (A) 09/11/2017 1942  APPEARANCEUR TURBID (A) 09/11/2017 1942   LABSPEC  09/11/2017 1942    TEST NOT REPORTED DUE TO COLOR INTERFERENCE OF URINE PIGMENT   PHURINE  09/11/2017 1942    TEST NOT REPORTED DUE TO COLOR INTERFERENCE OF URINE PIGMENT   GLUCOSEU (A) 09/11/2017 1942    TEST NOT REPORTED DUE TO COLOR INTERFERENCE OF URINE PIGMENT   HGBUR (A) 09/11/2017 1942    TEST NOT REPORTED DUE TO COLOR INTERFERENCE OF URINE PIGMENT   BILIRUBINUR (A) 09/11/2017 1942    TEST NOT REPORTED DUE TO COLOR INTERFERENCE OF URINE PIGMENT   KETONESUR (A) 09/11/2017 1942    TEST NOT REPORTED DUE TO COLOR INTERFERENCE OF URINE PIGMENT   PROTEINUR (A) 09/11/2017 1942    TEST NOT REPORTED DUE TO COLOR INTERFERENCE OF URINE PIGMENT   UROBILINOGEN 0.2 12/20/2014 1851   NITRITE (A) 09/11/2017 1942    TEST NOT REPORTED DUE TO COLOR INTERFERENCE OF URINE PIGMENT   LEUKOCYTESUR (A) 09/11/2017 1942    TEST NOT REPORTED DUE TO COLOR INTERFERENCE OF URINE PIGMENT    Pertinent Imaging: CT reviewed as  above  Assessment & Plan:    1. UTI 2. Bilateral hydronephrosis 3. Neurogenic bladder 4. Left ureteral calculi x2  5. Left nonobstructing renal stones At this point, the patient's only complaint is her suprapubic pain.  Her vital signs and laboratory values including her kidney function are completely normal.  She also has what appears to be bilateral new onset hydroureteronephrosis to the level of the urinary bladder with 2 small stones in the left ureter with dilation distal to the stones.  She is exhibiting no signs or symptoms of sepsis at this time.  Therefore, I do not think she needs urgent intervention for these stones.  Certainly if she develops a fever or her white blood cell count increases that this would need to be readdressed.  For now I would continue antibiotics for presumed urinary tract infection.  As an outpatient, once the has been demonstrated that the stones have cleared, I think she would benefit from a Lasix renogram to determine if the hydronephrosis is obstructive versus nonobstructive.  It certainly is reassuring that her renal function is normal at this time.   Nickie Retort, MD

## 2017-09-12 NOTE — Progress Notes (Signed)
[name] is a 57 y.o. female with medical history significant of MS, neurogenic bladder, non-ambulatory at baseline in SNF, DM2, HTN, UTI with ESBL e.coli in Mar 2018.  Patient presents to the ED with c/o abd pain, pain at site of suprapubic catheter.  Nothing makes pain better or worse.  Pain onset about 1 month ago, intermittent but worsened over past couple of days.  No radiation to back.  No N/V/D.  No fevers.  09/12/17: No new complaints.  Please refer to H&P dictated by Dr Alcario Drought on 09/12/17 for further details of the assessment and plan.

## 2017-09-12 NOTE — ED Notes (Signed)
ED TO INPATIENT HANDOFF REPORT  Name/Age/Gender [name] 57 y.o. female  Code Status    Code Status Orders  (From admission, onward)        Start     Ordered   09/12/17 0021  Full code  Continuous     09/12/17 0023    Code Status History    Date Active Date Inactive Code Status Order ID Comments User Context   08/15/2016 1351 08/19/2016 2106 Full Code [id number]  Franchot Gallo, MD Inpatient   08/15/2016 1028 08/15/2016 1224 Full Code [id number]  Franchot Gallo, MD Outpatient   07/12/2016 2051 07/28/2016 1910 Full Code [id number]  Juanito Doom, MD Inpatient   05/05/2016 0226 05/17/2016 1820 Full Code [id number]  Corey Harold, NP ED   03/08/2016 1703 03/20/2016 1650 Full Code [id number]  Germain Osgood, PA-C ED   12/18/2015 1516 12/31/2015 0207 Full Code [id number]  Bonnell Public, MD Inpatient   11/12/2015 1637 11/19/2015 0128 DNR [id number]  Allie Bossier, MD Inpatient   11/11/2015 1242 11/12/2015 1637 Full Code [id number]  Waldemar Dickens, MD ED   08/31/2015 0640 09/01/2015 1819 Full Code [id number]  Rise Patience, MD Inpatient   07/20/2015 2117 07/23/2015 1825 Full Code [id number]  Toy Baker, MD Inpatient   07/05/2015 2232 07/09/2015 1716 Full Code [id number]  Rise Patience, MD Inpatient   12/31/2014 1559 01/03/2015 1750 Full Code [id number]  Nita Sells, MD Inpatient   12/14/2014 0521 12/21/2014 1832 Full Code [id number]  Mariea Clonts, MD Inpatient   12/13/2014 2013 12/14/2014 0521 Full Code [id number]  Nita Sells, MD Inpatient   06/25/2012 2315 06/30/2012 1448 Full Code [id number]  Arlyss Repress., MD ED      Home/SNF/Other Skilled nursing facility  Chief Complaint Abdominal Pain  Level of Care/Admitting Diagnosis ED Disposition    ED Disposition Condition [facility]: [facility] [[id number]]  Level of Care: Med-Surg [16]  Diagnosis: UTI due to extended-spectrum beta lactamase (ESBL) producing Escherichia coli  [[id number]]  Admitting Physician: Doreatha Massed  Attending Physician: Etta Quill [phone]  Estimated length of stay: past midnight tomorrow  Certification:: I certify this patient will need inpatient services for at least 2 midnights  PT Class (Do Not Modify): Inpatient [101]  PT Acc Code (Do Not Modify): Private [1]       Medical History Past Medical History:  Diagnosis Date  . Acute parametritis and pelvic cellulitis   . Acute renal failure superimposed on stage 3 chronic kidney disease ([place]) 07/21/2015  . Anemia    chronic   . Contracture, right hand   . Diabetes mellitus without complication ([place])   . Diabetic neuropathy ([place])   . DVT (deep venous thrombosis) ([place])   . DVT (deep venous thrombosis) ([place])   . GERD (gastroesophageal reflux disease)   . History of kidney stones   . Hx of sepsis   . Hyperlipidemia   . Hypertension   . Hypokalemia   . Hypomagnesemia   . Left nephrolithiasis   . Leukocytosis   . Lymph edema    chronic   . Malnutrition of moderate degree 07/21/2015  . Multiple sclerosis diagnosed 2002-not on Therapy any longer 06/26/2012  . Muscle weakness (generalized)   . Neuromuscular disorder ([place])   . Neuromuscular dysfunction of bladder   . Polyneuropathy   . Presence of indwelling urinary catheter   . Protein calorie malnutrition ([place])   . Stage 4  decubitus ulcer ([place]) 07/05/2015  . Stroke ([place])   . UTI (lower urinary tract infection)     Allergies No Known Allergies  IV Location/Drains/Wounds Patient Lines/Drains/Airways Status   Active Line/Drains/Airways    Name:   Placement date:   Placement time:   Site:   Days:   Peripheral IV 09/11/17 Left Hand   09/11/17    2027    Hand   1   Peripheral IV 09/11/17 Left;Upper Arm   09/11/17    2134    Arm   1   Suprapubic Catheter Non-latex 16 Fr.   11/17/16    1709    Non-latex   299   Incision (Closed) 08/18/16 Back   08/18/16    1441     390   Pressure Injury 05/05/16 Stage II -  Partial  thickness loss of dermis presenting as a shallow open ulcer with a red, pink wound bed without slough. Stage 2 pressure sore to Sacrum   05/05/16    0330     495   Pressure Injury 05/05/16 Stage I -  Intact skin with non-blanchable redness of a localized area usually over a bony prominence. to bottom/ crease of buttocks. Healing appearing   05/05/16    0330     495   Wound / Incision (Open or Dehisced) 08/31/15 Laceration Buttocks Left;Lower skin tear on lower left buttocks near skin fold   08/31/15    0615    Buttocks   743   Wound / Incision (Open or Dehisced) 07/12/16 Venous stasis ulcer Foot Right;Left Hardened, blackened areas on bilateral feet and ankles    07/12/16    2100    Foot   427          Labs/Imaging Results for orders placed or performed during the hospital encounter of 09/11/17 (from the past 48 hour(s))  Urinalysis, Routine w reflex microscopic     Status: Abnormal   Collection Time: 09/11/17  7:42 PM  Result Value Ref Range   Color, Urine BROWN (A) YELLOW    Comment: BIOCHEMICALS MAY BE AFFECTED BY COLOR   APPearance TURBID (A) CLEAR   Specific Gravity, Urine  1.005 - 1.030    TEST NOT REPORTED DUE TO COLOR INTERFERENCE OF URINE PIGMENT   pH  5.0 - 8.0    TEST NOT REPORTED DUE TO COLOR INTERFERENCE OF URINE PIGMENT   Glucose, UA (A) NEGATIVE mg/dL    TEST NOT REPORTED DUE TO COLOR INTERFERENCE OF URINE PIGMENT   Hgb urine dipstick (A) NEGATIVE    TEST NOT REPORTED DUE TO COLOR INTERFERENCE OF URINE PIGMENT   Bilirubin Urine (A) NEGATIVE    TEST NOT REPORTED DUE TO COLOR INTERFERENCE OF URINE PIGMENT   Ketones, ur (A) NEGATIVE mg/dL    TEST NOT REPORTED DUE TO COLOR INTERFERENCE OF URINE PIGMENT   Protein, ur (A) NEGATIVE mg/dL    TEST NOT REPORTED DUE TO COLOR INTERFERENCE OF URINE PIGMENT   Nitrite (A) NEGATIVE    TEST NOT REPORTED DUE TO COLOR INTERFERENCE OF URINE PIGMENT   Leukocytes, UA (A) NEGATIVE    TEST NOT REPORTED DUE TO COLOR INTERFERENCE OF URINE  PIGMENT    Comment: Performed at [facility], [place] [address] [place],  [address]  Urinalysis, Microscopic (reflex)     Status: Abnormal   Collection Time: 09/11/17  7:42 PM  Result Value Ref Range   RBC / HPF 6-30 0 - 5 RBC/hpf  WBC, UA 6-30 0 - 5 WBC/hpf   Bacteria, UA MANY (A) NONE SEEN   Squamous Epithelial / LPF TOO NUMEROUS TO COUNT (A) NONE SEEN    Comment: Performed at [facility], [place] [address], [place], [place] [address]  Lipase, blood     Status: None   Collection Time: 09/11/17  8:27 PM  Result Value Ref Range   Lipase 43 11 - 51 U/L    Comment: Performed at [facility], [place] [address], [place], [place] [address]  Comprehensive metabolic panel     Status: Abnormal   Collection Time: 09/11/17  8:27 PM  Result Value Ref Range   Sodium 140 135 - 145 mmol/L   Potassium 4.4 3.5 - 5.1 mmol/L   Chloride 107 101 - 111 mmol/L   CO2 21 (L) 22 - 32 mmol/L   Glucose, Bld 162 (H) 65 - 99 mg/dL   BUN 16 6 - 20 mg/dL   Creatinine, Ser 0.83 0.44 - 1.00 mg/dL   Calcium 10.5 (H) 8.9 - 10.3 mg/dL   Total Protein 8.5 (H) 6.5 - 8.1 g/dL   Albumin 3.6 3.5 - 5.0 g/dL   AST 16 15 - 41 U/L   ALT 15 14 - 54 U/L   Alkaline Phosphatase 74 38 - 126 U/L   Total Bilirubin 0.4 0.3 - 1.2 mg/dL   GFR calc non Af Amer >60 >60 mL/min   GFR calc Af Amer >60 >60 mL/min    Comment: (NOTE) The eGFR has been calculated using the CKD EPI equation. This calculation has not been validated in all clinical situations. eGFR's persistently <60 mL/min signify possible Chronic Kidney Disease.    Anion gap 12 5 - 15    Comment: Performed at [facility], [place] [address] [place], [place] [address]  CBC     Status: Abnormal   Collection Time: 09/11/17  8:27 PM  Result Value Ref Range   WBC 13.6 (H) 4.0 - 10.5 K/uL   RBC 3.57 (L) 3.87 - 5.11 MIL/uL   Hemoglobin 10.4 (L) 12.0 - 15.0 g/dL   HCT 33.3 (L) 36.0 - 46.0 %   MCV  93.3 78.0 - 100.0 fL   MCH 29.1 26.0 - 34.0 pg   MCHC 31.2 30.0 - 36.0 g/dL   RDW 17.3 (H) 11.5 - 15.5 %   Platelets 455 (H) 150 - 400 K/uL    Comment: Performed at [facility], [place] [address], [place], [place] [address]   Ct Abdomen Pelvis W Contrast  Result Date: 09/11/2017 CLINICAL DATA:  Abdominal pain at catheter site. History of multiple sclerosis, decubitus ulcers, stroke. EXAM: CT ABDOMEN AND PELVIS WITH CONTRAST TECHNIQUE: Multidetector CT imaging of the abdomen and pelvis was performed using the standard protocol following bolus administration of intravenous contrast. CONTRAST:  154m ISOVUE-300 IOPAMIDOL (ISOVUE-300) INJECTION 61% COMPARISON:  CT abdomen and pelvis July 26, 2017 FINDINGS: LOWER CHEST: Dependent atelectasis. Included heart size is normal. No pericardial effusion. HEPATOBILIARY: Liver and gallbladder are normal. PANCREAS: Normal. SPLEEN: Normal. ADRENALS/URINARY TRACT: Kidneys orthotopic. Striated enhancement RIGHT kidney with moderate hydronephrosis and urothelial enhancement. Interval removal of nephrostomy tube with scarring. Moderate LEFT hydronephrosis with multiple parapelvic cyst and urothelial enhancement. Multiple LEFT nephrolithiasis measuring to 3 mm. LEFT greater than RIGHT periureteral fat stranding. 4 mm distal LEFT ureteral calculus; dense, likely debris distended LEFT ureter. Interval LEFT nephrostomy tube. Urinary bladder decompressed with indwelling suprapubic catheter. No inflammatory changes or focal fluid  collection about the catheter tract. STOMACH/BOWEL: The stomach, small and large bowel are normal in course and caliber without inflammatory changes. Normal appendix. VASCULAR/LYMPHATIC: Aortoiliac vessels are normal in course and caliber. Mild intimal thickening calcific atherosclerosis. No lymphadenopathy by CT size criteria. REPRODUCTIVE: Lobulated heterogeneously enhancing uterus most compatible with leiomyomas. OTHER: No  intraperitoneal free fluid or free air. MUSCULOSKELETAL: Nonacute. Anterior abdominal wall ligamentous laxity. Osteopenia. Generalized muscle atrophy. Scarring extending to ischial tuberosities compatible with old decubitus ulcers without focal fluid collection. IMPRESSION: 1. 4 mm distal LEFT ureteral calculus. Multiple LEFT nephrolithiasis measuring to 3 mm. 2. Bilateral pyelitis and RIGHT pyelonephritis. 3. Moderate bilateral hydronephrosis, status post interval removal of nephrostomy tubes. Aortic Atherosclerosis (ICD10-I70.0). Electronically Signed   By: Elon Alas M.D.   On: 09/11/2017 22:32    Pending Labs Unresulted Labs (From admission, onward)   Start     Ordered   09/12/17 0500  CBC  Tomorrow morning,   R     09/12/17 0023   09/12/17 6468  Basic metabolic panel  Tomorrow morning,   R     09/12/17 0023   09/12/17 0021  HIV antibody (Routine Testing)  Once,   R     09/12/17 0023   09/11/17 2235  Culture, Urine  Once,   R     09/11/17 2234      Vitals/Pain Today's Vitals   09/11/17 1945 09/11/17 2134 09/12/17 0017 09/12/17 0018  BP: 123/70   118/80  Pulse: 88   91  Resp: 16   18  Temp: 98.7 F (37.1 C)   98.4 F (36.9 C)  TempSrc: Oral   Oral  SpO2: 99%   99%  Weight:  143 lb (64.9 kg)    Height:  5' 5.5" (1.664 m)    PainSc: 7   7      Isolation Precautions Contact precautions  Medications Medications  iopamidol (ISOVUE-300) 61 % injection (has no administration in time range)  sodium chloride 0.9 % injection (has no administration in time range)  meropenem (MERREM) 1 g in sodium chloride 0.9 % 100 mL IVPB (1 g Intravenous New Bag/Given 09/12/17 0020)  acetaminophen (TYLENOL) tablet 650 mg (has no administration in time range)    Or  acetaminophen (TYLENOL) suppository 650 mg (has no administration in time range)  ondansetron (ZOFRAN) tablet 4 mg (has no administration in time range)    Or  ondansetron (ZOFRAN) injection 4 mg (has no administration in time  range)  enoxaparin (LOVENOX) injection 40 mg (has no administration in time range)  insulin aspart (novoLOG) injection 0-15 Units (has no administration in time range)  albuterol (PROVENTIL) (2.5 MG/3ML) 0.083% nebulizer solution 2.5 mg (has no administration in time range)  atorvastatin (LIPITOR) tablet 10 mg (has no administration in time range)  famotidine (PEPCID) tablet 20 mg (has no administration in time range)  ferrous sulfate EC tablet 325 mg (has no administration in time range)  gabapentin (NEURONTIN) capsule 100 mg (has no administration in time range)  lisinopril (PRINIVIL,ZESTRIL) tablet 2.5 mg (has no administration in time range)  Magnesium TABS 400 mg (has no administration in time range)  multivitamin with minerals tablet 1 tablet (has no administration in time range)  oxybutynin (DITROPAN) tablet 5 mg (has no administration in time range)  polyethylene glycol (MIRALAX / GLYCOLAX) packet 17 g (has no administration in time range)  potassium chloride (K-DUR,KLOR-CON) CR tablet 10 mEq (has no administration in time range)  sodium chloride 0.9 % bolus 1,000  mL (0 mLs Intravenous Stopped 09/12/17 0004)  iopamidol (ISOVUE-300) 61 % injection 100 mL (100 mLs Intravenous Contrast Given 09/11/17 2209)    Mobility non-ambulatory

## 2017-09-12 NOTE — ED Notes (Signed)
According to Dr. Sheran Luz, patient may have HIV antibody (Routine testing) performed in the morning with routine labs.

## 2017-09-13 DIAGNOSIS — N390 Urinary tract infection, site not specified: Secondary | ICD-10-CM | POA: Diagnosis present

## 2017-09-13 DIAGNOSIS — N1330 Unspecified hydronephrosis: Secondary | ICD-10-CM | POA: Diagnosis present

## 2017-09-13 LAB — FERRITIN: FERRITIN: 174 ng/mL (ref 11–307)

## 2017-09-13 LAB — CBC
HCT: 26.7 % — ABNORMAL LOW (ref 36.0–46.0)
Hemoglobin: 8.2 g/dL — ABNORMAL LOW (ref 12.0–15.0)
MCH: 28.7 pg (ref 26.0–34.0)
MCHC: 30.7 g/dL (ref 30.0–36.0)
MCV: 93.4 fL (ref 78.0–100.0)
Platelets: 451 10*3/uL — ABNORMAL HIGH (ref 150–400)
RBC: 2.86 MIL/uL — ABNORMAL LOW (ref 3.87–5.11)
RDW: 17.5 % — AB (ref 11.5–15.5)
WBC: 10.5 10*3/uL (ref 4.0–10.5)

## 2017-09-13 LAB — VITAMIN B12: Vitamin B-12: 1010 pg/mL — ABNORMAL HIGH (ref 180–914)

## 2017-09-13 LAB — BASIC METABOLIC PANEL
Anion gap: 8 (ref 5–15)
BUN: 16 mg/dL (ref 6–20)
CALCIUM: 10.3 mg/dL (ref 8.9–10.3)
CO2: 24 mmol/L (ref 22–32)
CREATININE: 0.77 mg/dL (ref 0.44–1.00)
Chloride: 111 mmol/L (ref 101–111)
GFR calc Af Amer: >60 mL/min (ref >60)
GFR calc non Af Amer: >60 mL/min (ref >60)
GLUCOSE: 140 mg/dL — AB (ref 65–99)
Potassium: 4.2 mmol/L (ref 3.5–5.1)
Sodium: 143 mmol/L (ref 135–145)

## 2017-09-13 LAB — GLUCOSE, CAPILLARY
GLUCOSE-CAPILLARY: 144 mg/dL — AB (ref 65–99)
Glucose-Capillary: 146 mg/dL — ABNORMAL HIGH (ref 65–99)
Glucose-Capillary: 283 mg/dL — ABNORMAL HIGH (ref 65–99)

## 2017-09-13 LAB — IRON AND TIBC
Iron: 47 ug/dL (ref 28–170)
Saturation Ratios: 22 % (ref 10.4–31.8)
TIBC: 216 ug/dL — AB (ref 250–450)
UIBC: 169 ug/dL

## 2017-09-13 LAB — URINE CULTURE

## 2017-09-13 LAB — FOLATE: FOLATE: 7.9 ng/mL (ref >5.9)

## 2017-09-13 NOTE — Progress Notes (Signed)
Patient refusing insulin injections today. She states she takes a pill to manage her diabetes. This RN educated on why she is not receiving diabetes pills. Patient expresses understanding but refuses insulin injections, blood sugars in 140s-150s prior to each meal.

## 2017-09-13 NOTE — Clinical Social Work Note (Signed)
Clinical Social Work Assessment  Patient Details  Name: [name] MRN: [id number] Date of Birth: 08-16-1960  Date of referral:  09/13/17               Reason for consult:  Facility Placement                Permission sought to share information with:  Chartered certified accountant granted to share information::  Yes, Verbal Permission Granted  Name::        Agency::  [name]  Relationship::     Contact Information:     Housing/Transportation Living arrangements for the past 2 months:  [place] of Information:  Patient Patient Interpreter Needed:  None Criminal Activity/Legal Involvement Pertinent to Current Situation/Hospitalization:  No - Comment as needed Significant Relationships:  Other Family Members Lives with:  Facility Resident Do you feel safe going back to the place where you live?  Yes Need for family participation in patient care:  Yes (Comment)  Care giving concerns:  No care giving concerns at the time of assessment.    Social Worker assessment / plan:  LCSW following for facility placement.   Patient from [facility].   Patient admitted for abdominal pain.  LCSW met at bedside with patient. No family present. According to Epic patient has legal guardian. Patient states that she does not have a legal guardian.   Patient reports that she has been at [facility] for the past year. At baseline patient is wheelchair bound. Patient requires assistance with ADLs. Patient is able to feed herself.   PLAN: Patient will return to [facility] at [employer].    Employment status:  Disabled (Comment on whether or not currently receiving Disability) Insurance information:  Managed Medicare PT Recommendations:  No Follow Up Information / Referral to community resources:     Patient/Family's Response to care:  Patient is thankful for LCSW visit. Patient reports that she is very satisfied with the care she has received on [facility] [address]. Patient  stated " I hate I got sick, but this has been like a mini vacation for me".   Patient/Family's Understanding of and Emotional Response to Diagnosis, Current Treatment, and Prognosis:  Patient is understanding of diagnosis and agreeable of treatment plan. Patient reports that she is glad the doctor found out what was wrong and she is no longer in pain.   Emotional Assessment Appearance:  Appears older than stated age Attitude/Demeanor/Rapport:    Affect (typically observed):  Accepting, Calm Orientation:  Oriented to Self, Oriented to Place, Oriented to  Time Alcohol / Substance use:  Not Applicable Psych involvement (Current and /or in the community):  No (Comment)  Discharge Needs  Concerns to be addressed:  No discharge needs identified Readmission within the last 30 days:  No Current discharge risk:  None Barriers to Discharge:  Continued Medical Work up   [employer], LCSW 09/13/2017, 10:35 AM

## 2017-09-13 NOTE — Progress Notes (Signed)
PROGRESS NOTE    [name]  SHF:[id number] DOB: September 05, 1960 DOA: 09/11/2017 PCP: Patient, [name]    Brief Narrative:  The patient is a 57 year old female with a complex history including significant nephrolithiasis requiring PCNL, MS, neurogenic bladder requiring suprapubic catheter, and multiple other medical comorbidities who presents the hospital with suprapubic pain.  The patient states that she has been experiencing suprapubic pain around her SP tube for the last month however it worsened over the last few days.  She has no flank pain.  She has no fevers or chills.  She has no nausea or vomiting.  Her still complaint is this pain around her SP tube.  She was found to have bilateral hydroureteronephrosis down to the level of the bladder.  She also had a 3 and 4 mm left ureteral stone with dilation of the ureter distal to the stones.  She also nonobstructing Left nephrolithiasis.  Urology was consulted for further recommendations regarding this.  She was admitted for a presumed urinary tract infection from a culture taken from her SP tube.  Again she has no flank pain.  She has been afebrile with stable vital signs.  Her kidney function is at baseline.  She has no significant white blood cell count.  She has no signs of sepsis.  The patient does have a history of requiring nephrostomy tubes and bilateral percutaneous nephrolithotomy last year.  Her nephrostomy tubes were removed in December 2018.      Assessment & Plan:   Principal Problem:   Pyelonephritis Active Problems:   Acute lower UTI   Multiple sclerosis diagnosed 2002-not on Therapy any longer   GERD without esophagitis   Chronic indwelling Foley catheter   Neurogenic bladder   Controlled diabetes mellitus type 2 with complications ([place])   Left nephrolithiasis   Neuropathic pain   Essential hypertension   Renal calculus   Bilateral hydronephrosis  #1 acute pyelonephritis/CAUTI (presumed secondary to ESBL)/bilateral  pyelitis Initial concern that due to patient's prior history of ESBL UTI patient was subsequently placed empirically on IV Merrem.  Urine cultures with multiple species present.  Repeat UA with cultures and sensitivities.  Currently afebrile.  Improving clinically daily.  Leukocytosis slowly trending down.  Could likely transition to oral antibiotics in the next 1-2 days to complete a 10-14-day course of antibiotic treatment.  2.  Bilateral hydronephrosis/nephrolithiasis Patient seen in consultation by urology and is felt that patient is not exhibiting any signs or symptoms of sepsis at this time and as such no urgent intervention.  Once needed at this time.  However if patient would develop a fever with worsening leukocytosis that may consider intervention for stones.  Urology recommending continuing presumed antibiotics for UTI with outpatient follow-up.  3.  Multiple sclerosis with neurogenic bladder, nonambulatory Currently stable.  Outpatient follow-up.  4.  Diabetes mellitus II Patient refusing insulin injections today.  Continue to hold oral hypoglycemic agents.  Hemoglobin A1c.  Sliding scale insulin.  5.  Hypertension Blood pressure stable.  Continue lisinopril.   DVT prophylaxis: Lovenox Code Status: Full Family Communication: Updated patient.  No family at bedside. Disposition Plan: Back to skilled nursing facility once medically stable, on oral antibiotics with clinical improvement.  Hopefully in the next 24-48 hours.   Consultants:   Urology: Dr.Budzyn 09/12/2017  Procedures:   CT abdomen and pelvis 09/11/2017    Antimicrobials:   IV Merrem 09/11/2017   Subjective: Patient denies any further abdominal pain.  No nausea or vomiting.  Tolerating oral intake.  No shortness of breath.  No chest pain.  Objective: Vitals:   09/12/17 1523 09/12/17 2154 09/13/17 0609 09/13/17 1408  BP: 100/60 106/60 108/63 121/64  Pulse:  95 93 87  Resp:  19 20 18   Temp:  99.2 F (37.3 C)  98.2 F (36.8 C) 98.5 F (36.9 C)  TempSrc:  Oral Oral Axillary  SpO2: 100% 98% 100% 100%  Weight:      Height:        Intake/Output Summary (Last 24 hours) at 09/13/2017 1850 Last data filed at 09/13/2017 1632 Gross per 24 hour  Intake 360 ml  Output 450 ml  Net -90 ml   Filed Weights   09/11/17 2134 09/12/17 0150  Weight: 64.9 kg (143 lb) 63.8 kg (140 lb 9.8 oz)    Examination:  General exam: Appears calm and comfortable  Respiratory system: Clear to auscultation. Respiratory effort normal. Cardiovascular system: S1 & S2 heard, RRR. No JVD, murmurs, rubs, gallops or clicks. No pedal edema. Gastrointestinal system: Abdomen is nondistended, soft and nontender. No organomegaly or masses felt. Normal bowel sounds heard.  Suprapubic site clean dry and intact, nontender to palpation. Central nervous system: Alert and oriented. No focal neurological deficits. Extremities: Symmetric 5 x 5 power. Skin: No rashes, lesions or ulcers Psychiatry: Judgement and insight appear normal. Mood & affect appropriate.     Data Reviewed: I have personally reviewed following labs and imaging studies  CBC: Recent Labs  Lab 09/11/17 2027 09/12/17 0615 09/13/17 0615  WBC 13.6* 12.2* 10.5  HGB 10.4* 9.5* 8.2*  HCT 33.3* 30.8* 26.7*  MCV 93.3 93.1 93.4  PLT 455* 461* 315*   Basic Metabolic Panel: Recent Labs  Lab 09/11/17 2027 09/12/17 0615 09/13/17 0615  NA 140 140 143  K 4.4 3.9 4.2  CL 107 110 111  CO2 21* 22 24  GLUCOSE 162* 199* 140*  BUN 16 17 16   CREATININE 0.83 0.84 0.77  CALCIUM 10.5* 10.0 10.3   GFR: Estimated Creatinine Clearance: 70.4 mL/min (by C-G formula based on SCr of 0.77 mg/dL). Liver Function Tests: Recent Labs  Lab 09/11/17 2027  AST 16  ALT 15  ALKPHOS 74  BILITOT 0.4  PROT 8.5*  ALBUMIN 3.6   Recent Labs  Lab 09/11/17 2027  LIPASE 43   No results for input(s): AMMONIA in the last 168 hours. Coagulation Profile: No results for input(s): INR,  PROTIME in the last 168 hours. Cardiac Enzymes: No results for input(s): CKTOTAL, CKMB, CKMBINDEX, TROPONINI in the last 168 hours. BNP (last 3 results) No results for input(s): PROBNP in the last 8760 hours. HbA1C: No results for input(s): HGBA1C in the last 72 hours. CBG: Recent Labs  Lab 09/12/17 1132 09/12/17 1736 09/12/17 2043 09/13/17 1130 09/13/17 1636  GLUCAP 181* 173* 150* 144* 146*   Lipid Profile: No results for input(s): CHOL, HDL, LDLCALC, TRIG, CHOLHDL, LDLDIRECT in the last 72 hours. Thyroid Function Tests: No results for input(s): TSH, T4TOTAL, FREET4, T3FREE, THYROIDAB in the last 72 hours. Anemia Panel: Recent Labs    09/13/17 0615  VITAMINB12 1,010*  FOLATE 7.9  FERRITIN 174  TIBC 216*  IRON 47   Sepsis Labs: No results for input(s): PROCALCITON, LATICACIDVEN in the last 168 hours.  Recent Results (from the past 240 hour(s))  Culture, Urine     Status: Abnormal   Collection Time: 09/11/17 10:35 PM  Result Value Ref Range Status   Specimen Description   Final    URINE,  RANDOM Performed at [facility], [place] [address], [place], [place] [address]    Special Requests   Final    NONE Performed at [facility], [place] [address] [place], [place] [address]    Culture MULTIPLE SPECIES PRESENT, SUGGEST RECOLLECTION (A)  Final   Report Status 09/13/2017 FINAL  Final  MRSA PCR Screening     Status: None   Collection Time: 09/12/17  3:16 AM  Result Value Ref Range Status   MRSA by PCR NEGATIVE NEGATIVE Final    Comment:        The GeneXpert MRSA Assay (FDA approved for NASAL specimens only), is one component of a comprehensive MRSA colonization surveillance program. It is not intended to diagnose MRSA infection nor to guide or monitor treatment for MRSA infections. Performed at [facility], [place] [address] [place], [place] [address]          Radiology Studies: Ct Abdomen Pelvis W  Contrast  Result Date: 09/11/2017 CLINICAL DATA:  Abdominal pain at catheter site. History of multiple sclerosis, decubitus ulcers, stroke. EXAM: CT ABDOMEN AND PELVIS WITH CONTRAST TECHNIQUE: Multidetector CT imaging of the abdomen and pelvis was performed using the standard protocol following bolus administration of intravenous contrast. CONTRAST:  140mL ISOVUE-300 IOPAMIDOL (ISOVUE-300) INJECTION 61% COMPARISON:  CT abdomen and pelvis July 26, 2017 FINDINGS: LOWER CHEST: Dependent atelectasis. Included heart size is normal. No pericardial effusion. HEPATOBILIARY: Liver and gallbladder are normal. PANCREAS: Normal. SPLEEN: Normal. ADRENALS/URINARY TRACT: Kidneys orthotopic. Striated enhancement RIGHT kidney with moderate hydronephrosis and urothelial enhancement. Interval removal of nephrostomy tube with scarring. Moderate LEFT hydronephrosis with multiple parapelvic cyst and urothelial enhancement. Multiple LEFT nephrolithiasis measuring to 3 mm. LEFT greater than RIGHT periureteral fat stranding. 4 mm distal LEFT ureteral calculus; dense, likely debris distended LEFT ureter. Interval LEFT nephrostomy tube. Urinary bladder decompressed with indwelling suprapubic catheter. No inflammatory changes or focal fluid collection about the catheter tract. STOMACH/BOWEL: The stomach, small and large bowel are normal in course and caliber without inflammatory changes. Normal appendix. VASCULAR/LYMPHATIC: Aortoiliac vessels are normal in course and caliber. Mild intimal thickening calcific atherosclerosis. No lymphadenopathy by CT size criteria. REPRODUCTIVE: Lobulated heterogeneously enhancing uterus most compatible with leiomyomas. OTHER: No intraperitoneal free fluid or free air. MUSCULOSKELETAL: Nonacute. Anterior abdominal wall ligamentous laxity. Osteopenia. Generalized muscle atrophy. Scarring extending to ischial tuberosities compatible with old decubitus ulcers without focal fluid collection. IMPRESSION: 1. 4  mm distal LEFT ureteral calculus. Multiple LEFT nephrolithiasis measuring to 3 mm. 2. Bilateral pyelitis and RIGHT pyelonephritis. 3. Moderate bilateral hydronephrosis, status post interval removal of nephrostomy tubes. Aortic Atherosclerosis (ICD10-I70.0). Electronically Signed   By: Elon Alas M.D.   On: 09/11/2017 22:32        Scheduled Meds: . atorvastatin  10 mg Oral QHS  . enoxaparin (LOVENOX) injection  40 mg Subcutaneous Q24H  . famotidine  20 mg Oral BID  . ferrous sulfate  325 mg Oral BID  . gabapentin  100 mg Oral QHS  . insulin aspart  0-15 Units Subcutaneous TID WC  . lisinopril  2.5 mg Oral Daily  . magnesium oxide  400 mg Oral Daily  . multivitamin with minerals  1 tablet Oral Daily  . polyethylene glycol  17 g Oral BID  . potassium chloride  10 mEq Oral Daily   Continuous Infusions: . meropenem (MERREM) IV Stopped (09/13/17 1733)     LOS: 1 day    Time spent: 35 mins    Quillian Quince  [place]ville Silos, MD [facility] Pager [phone]  If 7PM-7AM, please contact night-coverage [email or web address] Password [email or web address] 09/13/2017, 6:50 PM

## 2017-09-13 NOTE — Progress Notes (Signed)
Subjective: Patient reports that she is not in pain  Objective: Vital signs in last 24 hours: Temp:  [98.2 F (36.8 C)-99.2 F (37.3 C)] 98.5 F (36.9 C) (04/03 1408) Pulse Rate:  [87-95] 87 (04/03 1408) Resp:  [18-20] 18 (04/03 1408) BP: (106-121)/(60-64) 121/64 (04/03 1408) SpO2:  [98 %-100 %] 100 % (04/03 1408)  Intake/Output from previous day: No intake/output data recorded. Intake/Output this shift: No intake/output data recorded.  Physical Exam:  Constitutional: Vital signs reviewed. WD WN in NAD   Eyes: PERRL, No scleral icterus.   Cardiovascular: RRR Pulmonary/Chest: Normal effort Abdominal: Soft. Non-tender, non-distended, bowel sounds are normal, no masses, organomegaly, or guarding present. SP tube present--looks like it needs to be changed   Lab Results: Recent Labs    09/11/17 2027 09/12/17 0615 09/13/17 0615  HGB 10.4* 9.5* 8.2*  HCT 33.3* 30.8* 26.7*   BMET Recent Labs    09/12/17 0615 09/13/17 0615  NA 140 143  K 3.9 4.2  CL 110 111  CO2 22 24  GLUCOSE 199* 140*  BUN 17 16  CREATININE 0.84 0.77  CALCIUM 10.0 10.3   No results for input(s): LABPT, INR in the last 72 hours. No results for input(s): LABURIN in the last 72 hours. Results for orders placed or performed during the hospital encounter of 09/11/17  Culture, Urine     Status: Abnormal   Collection Time: 09/11/17 10:35 PM  Result Value Ref Range Status   Specimen Description   Final    URINE, RANDOM Performed at [place] [address], [place], [place] [address]    Special Requests   Final    NONE Performed at [facility], [place] [address], [place], [place] [address]    Culture MULTIPLE SPECIES PRESENT, SUGGEST RECOLLECTION (A)  Final   Report Status 09/13/2017 FINAL  Final  MRSA PCR Screening     Status: None   Collection Time: 09/12/17  3:16 AM  Result Value Ref Range Status   MRSA by PCR NEGATIVE NEGATIVE Final    Comment:         The GeneXpert MRSA Assay (FDA approved for NASAL specimens only), is one component of a comprehensive MRSA colonization surveillance program. It is not intended to diagnose MRSA infection nor to guide or monitor treatment for MRSA infections. Performed at [facility], [place] [address] [place], [place] [address]     Studies/Results: Ct Abdomen Pelvis W Contrast  Result Date: 09/11/2017 CLINICAL DATA:  Abdominal pain at catheter site. History of multiple sclerosis, decubitus ulcers, stroke. EXAM: CT ABDOMEN AND PELVIS WITH CONTRAST TECHNIQUE: Multidetector CT imaging of the abdomen and pelvis was performed using the standard protocol following bolus administration of intravenous contrast. CONTRAST:  113mL ISOVUE-300 IOPAMIDOL (ISOVUE-300) INJECTION 61% COMPARISON:  CT abdomen and pelvis July 26, 2017 FINDINGS: LOWER CHEST: Dependent atelectasis. Included heart size is normal. No pericardial effusion. HEPATOBILIARY: Liver and gallbladder are normal. PANCREAS: Normal. SPLEEN: Normal. ADRENALS/URINARY TRACT: Kidneys orthotopic. Striated enhancement RIGHT kidney with moderate hydronephrosis and urothelial enhancement. Interval removal of nephrostomy tube with scarring. Moderate LEFT hydronephrosis with multiple parapelvic cyst and urothelial enhancement. Multiple LEFT nephrolithiasis measuring to 3 mm. LEFT greater than RIGHT periureteral fat stranding. 4 mm distal LEFT ureteral calculus; dense, likely debris distended LEFT ureter. Interval LEFT nephrostomy tube. Urinary bladder decompressed with indwelling suprapubic catheter. No inflammatory changes or focal fluid collection about the catheter tract. STOMACH/BOWEL: The stomach, small and large bowel are normal in  course and caliber without inflammatory changes. Normal appendix. VASCULAR/LYMPHATIC: Aortoiliac vessels are normal in course and caliber. Mild intimal thickening calcific atherosclerosis. No lymphadenopathy by CT size  criteria. REPRODUCTIVE: Lobulated heterogeneously enhancing uterus most compatible with leiomyomas. OTHER: No intraperitoneal free fluid or free air. MUSCULOSKELETAL: Nonacute. Anterior abdominal wall ligamentous laxity. Osteopenia. Generalized muscle atrophy. Scarring extending to ischial tuberosities compatible with old decubitus ulcers without focal fluid collection. IMPRESSION: 1. 4 mm distal LEFT ureteral calculus. Multiple LEFT nephrolithiasis measuring to 3 mm. 2. Bilateral pyelitis and RIGHT pyelonephritis. 3. Moderate bilateral hydronephrosis, status post interval removal of nephrostomy tubes. Aortic Atherosclerosis (ICD10-I70.0). Electronically Signed   By: Elon Alas M.D.   On: 09/11/2017 22:32    Assessment/Plan:   History of urolithiasis with obvious recurrent urinary tract infection (multiple organisms grew out) with bilateral hydroureteronephrosis.  4 left renal stones,  2 fairly small left distal ureteral stones.  These ureteral stones may have been causing her pain.  Currently, pain is not present, she is afebrile and overall clinical condition is improving.    I would strongly recommend continuing IV antibiotics for 1 week, then performing cystoscopy, left ureteroscopy, extraction of left ureteral and renal calculi. I agree with meropenem.    I have spoken with the patient about this who agrees to proceed.  I think it is important to give at least one full week of antibiotics prior to intervention.    I will follow during her hospitalization   LOS: 1 day   Jorja Loa 09/13/2017, 7:52 PM

## 2017-09-14 ENCOUNTER — Other Ambulatory Visit: Payer: Self-pay | Admitting: Urology

## 2017-09-14 LAB — HEMOGLOBIN A1C
HEMOGLOBIN A1C: 7.4 % — AB (ref 4.8–5.6)
MEAN PLASMA GLUCOSE: 165.68 mg/dL

## 2017-09-14 LAB — CBC
HCT: 29.8 % — ABNORMAL LOW (ref 36.0–46.0)
HEMOGLOBIN: 9.3 g/dL — AB (ref 12.0–15.0)
MCH: 28.4 pg (ref 26.0–34.0)
MCHC: 31.2 g/dL (ref 30.0–36.0)
MCV: 90.9 fL (ref 78.0–100.0)
Platelets: 381 10*3/uL (ref 150–400)
RBC: 3.28 MIL/uL — AB (ref 3.87–5.11)
RDW: 17.3 % — ABNORMAL HIGH (ref 11.5–15.5)
WBC: 9 10*3/uL (ref 4.0–10.5)

## 2017-09-14 LAB — BASIC METABOLIC PANEL
ANION GAP: 5 (ref 5–15)
BUN: 14 mg/dL (ref 6–20)
CALCIUM: 10 mg/dL (ref 8.9–10.3)
CO2: 24 mmol/L (ref 22–32)
CREATININE: 0.67 mg/dL (ref 0.44–1.00)
Chloride: 110 mmol/L (ref 101–111)
GLUCOSE: 139 mg/dL — AB (ref 65–99)
Potassium: 4 mmol/L (ref 3.5–5.1)
Sodium: 139 mmol/L (ref 135–145)

## 2017-09-14 LAB — GLUCOSE, CAPILLARY
GLUCOSE-CAPILLARY: 233 mg/dL — AB (ref 65–99)
Glucose-Capillary: 151 mg/dL — ABNORMAL HIGH (ref 65–99)
Glucose-Capillary: 161 mg/dL — ABNORMAL HIGH (ref 65–99)
Glucose-Capillary: 141 mg/dL — ABNORMAL HIGH (ref 65–99)

## 2017-09-14 LAB — URINALYSIS, ROUTINE W REFLEX MICROSCOPIC
Bilirubin Urine: NEGATIVE
Glucose, UA: NEGATIVE mg/dL
HGB URINE DIPSTICK: NEGATIVE
Ketones, ur: NEGATIVE mg/dL
Nitrite: POSITIVE — AB
Protein, ur: 30 mg/dL — AB
SPECIFIC GRAVITY, URINE: 1.013 (ref 1.005–1.030)
pH: 8 (ref 5.0–8.0)

## 2017-09-14 NOTE — Progress Notes (Signed)
PROGRESS NOTE    THOMAS RHUDE  NOM:[id number] DOB: 09/07/1960 DOA: 09/11/2017 PCP: Patient, [name]    Brief Narrative:  The patient is a 57 year old female with a complex history including significant nephrolithiasis requiring PCNL, MS, neurogenic bladder requiring suprapubic catheter, and multiple other medical comorbidities who presents the hospital with suprapubic pain.  The patient states that she has been experiencing suprapubic pain around her SP tube for the last month however it worsened over the last few days.  She has no flank pain.  She has no fevers or chills.  She has no nausea or vomiting.  Her still complaint is this pain around her SP tube.  She was found to have bilateral hydroureteronephrosis down to the level of the bladder.  She also had a 3 and 4 mm left ureteral stone with dilation of the ureter distal to the stones.  She also nonobstructing Left nephrolithiasis.  Urology was consulted for further recommendations regarding this.  She was admitted for a presumed urinary tract infection from a culture taken from her SP tube.  Again she has no flank pain.  She has been afebrile with stable vital signs.  Her kidney function is at baseline.  She has no significant white blood cell count.  She has no signs of sepsis.  The patient does have a history of requiring nephrostomy tubes and bilateral percutaneous nephrolithotomy last year.  Her nephrostomy tubes were removed in December 2018.      Assessment & Plan:   Principal Problem:   Pyelonephritis Active Problems:   Acute lower UTI   Multiple sclerosis diagnosed 2002-not on Therapy any longer   GERD without esophagitis   Chronic indwelling Foley catheter   Neurogenic bladder   Controlled diabetes mellitus type 2 with complications ([place])   Left nephrolithiasis   Neuropathic pain   Essential hypertension   Renal calculus   Bilateral hydronephrosis  #1 acute pyelonephritis/CAUTI (presumed secondary to ESBL)/bilateral  pyelitis Initial concern that due to patient's prior history of ESBL UTI patient was subsequently placed empirically on IV Merrem.  Urine cultures with multiple species present.  Repeat UA with cultures and sensitivities ordered and currently pending.  Patient currently afebrile.  Leukocytosis improving daily.  Continue empiric IV antibiotics for a week per urology recommendations and then subsequently patient to undergo urological procedure for nephrolithiasis and bilateral hydronephrosis.  Post procedure and per urology could likely transition to oral antibiotics to complete a 10-14-day course of antibiotic treatment.  2.  Bilateral hydronephrosis/nephrolithiasis Patient seen in consultation by urology and is felt that patient is not exhibiting any signs or symptoms of sepsis at this time.  Urine cultures with multiple species and as such repeat UA with cultures have been ordered.  Patient has been reassessed by urology who are recommending continued IV antibiotics for a week and subsequently patient to undergo cystoscopy, left ureteroscopy, extraction of left ureteral and renal calculi during this hospitalization.  Urology following and appreciate input and recommendations.   3.  Multiple sclerosis with neurogenic bladder, nonambulatory Currently stable.  Outpatient follow-up.  4.  Diabetes mellitus II Hemoglobin A1c 7.4.  CBGs ranging from 151-233.  Continue to hold oral hypoglycemic agents.  Sliding scale insulin.   5.  Hypertension Stable.  Continue current regimen of lisinopril.    DVT prophylaxis: Lovenox Code Status: Full Family Communication: Updated patient.  No family at bedside. Disposition Plan: Back to skilled nursing facility once medically stable, post urological procedures and when okay with urology.  Consultants:   Urology: Dr.Budzyn 09/12/2017  Procedures:   CT abdomen and pelvis 09/11/2017    Antimicrobials:   IV Merrem 09/11/2017   Subjective: Patient states  had some abdominal pain today when suprapubic catheter was exchanged however abdominal pain slowly subsiding.  No nausea or emesis.  Denies any chest pain.  No shortness of breath.   Objective: Vitals:   09/13/17 1408 09/13/17 2021 09/14/17 0514 09/14/17 1416  BP: 121/64 98/69 104/65 112/82  Pulse: 87 95 77 97  Resp: 18 20 19 20   Temp: 98.5 F (36.9 C) 98.7 F (37.1 C) 97.7 F (36.5 C) 98.7 F (37.1 C)  TempSrc: Axillary Oral Oral Oral  SpO2: 100% 100% 95% 94%  Weight:      Height:        Intake/Output Summary (Last 24 hours) at 09/14/2017 2039 Last data filed at 09/14/2017 1245 Gross per 24 hour  Intake -  Output 1000 ml  Net -1000 ml   Filed Weights   09/11/17 2134 09/12/17 0150  Weight: 64.9 kg (143 lb) 63.8 kg (140 lb 9.8 oz)    Examination:  General exam: NAD Respiratory system: Lungs clear to auscultation bilaterally.  No wheezes, no crackles, no rhonchi.  Respiratory effort normal. Cardiovascular system: Regular rate rhythm no murmurs rubs or gallops.  No JVD.  No lower extremity edema.  Gastrointestinal system: Abdomen is soft, nontender, nondistended, positive bowel sounds.  Suprapubic site clean dry.  Central nervous system: Alert and oriented. No focal neurological deficits. Extremities: Symmetric 5 x 5 power. Skin: No rashes, lesions or ulcers Psychiatry: Judgement and insight appear normal. Mood & affect appropriate.     Data Reviewed: I have personally reviewed following labs and imaging studies  CBC: Recent Labs  Lab 09/11/17 2027 09/12/17 0615 09/13/17 0615 09/14/17 0654  WBC 13.6* 12.2* 10.5 9.0  HGB 10.4* 9.5* 8.2* 9.3*  HCT 33.3* 30.8* 26.7* 29.8*  MCV 93.3 93.1 93.4 90.9  PLT 455* 461* 451* 016   Basic Metabolic Panel: Recent Labs  Lab 09/11/17 2027 09/12/17 0615 09/13/17 0615 09/14/17 0654  NA 140 140 143 139  K 4.4 3.9 4.2 4.0  CL 107 110 111 110  CO2 21* 22 24 24   GLUCOSE 162* 199* 140* 139*  BUN 16 17 16 14   CREATININE 0.83  0.84 0.77 0.67  CALCIUM 10.5* 10.0 10.3 10.0   GFR: Estimated Creatinine Clearance: 70.4 mL/min (by C-G formula based on SCr of 0.67 mg/dL). Liver Function Tests: Recent Labs  Lab 09/11/17 2027  AST 16  ALT 15  ALKPHOS 74  BILITOT 0.4  PROT 8.5*  ALBUMIN 3.6   Recent Labs  Lab 09/11/17 2027  LIPASE 43   No results for input(s): AMMONIA in the last 168 hours. Coagulation Profile: No results for input(s): INR, PROTIME in the last 168 hours. Cardiac Enzymes: No results for input(s): CKTOTAL, CKMB, CKMBINDEX, TROPONINI in the last 168 hours. BNP (last 3 results) No results for input(s): PROBNP in the last 8760 hours. HbA1C: Recent Labs    09/14/17 0654  HGBA1C 7.4*   CBG: Recent Labs  Lab 09/13/17 1636 09/13/17 2055 09/14/17 0843 09/14/17 1151 09/14/17 1808  GLUCAP 146* 283* 151* 161* 233*   Lipid Profile: No results for input(s): CHOL, HDL, LDLCALC, TRIG, CHOLHDL, LDLDIRECT in the last 72 hours. Thyroid Function Tests: No results for input(s): TSH, T4TOTAL, FREET4, T3FREE, THYROIDAB in the last 72 hours. Anemia Panel: Recent Labs    09/13/17 0615  VITAMINB12 1,010*  FOLATE  7.9  FERRITIN 174  TIBC 216*  IRON 47   Sepsis Labs: No results for input(s): PROCALCITON, LATICACIDVEN in the last 168 hours.  Recent Results (from the past 240 hour(s))  Culture, Urine     Status: Abnormal   Collection Time: 09/11/17 10:35 PM  Result Value Ref Range Status   Specimen Description   Final    URINE, RANDOM Performed at [facility] [address] [place], [place] [address]    Special Requests   Final    NONE Performed at [facility], [place] [address], [place], [place] [address]    Culture MULTIPLE SPECIES PRESENT, SUGGEST RECOLLECTION (A)  Final   Report Status 09/13/2017 FINAL  Final  MRSA PCR Screening     Status: None   Collection Time: 09/12/17  3:16 AM  Result Value Ref Range Status   MRSA by PCR NEGATIVE NEGATIVE  Final    Comment:        The GeneXpert MRSA Assay (FDA approved for NASAL specimens only), is one component of a comprehensive MRSA colonization surveillance program. It is not intended to diagnose MRSA infection nor to guide or monitor treatment for MRSA infections. Performed at [facility], [place] [address] [place], [place] [address]          Radiology Studies: No results found.      Scheduled Meds: . atorvastatin  10 mg Oral QHS  . enoxaparin (LOVENOX) injection  40 mg Subcutaneous Q24H  . famotidine  20 mg Oral BID  . ferrous sulfate  325 mg Oral BID  . gabapentin  100 mg Oral QHS  . insulin aspart  0-15 Units Subcutaneous TID WC  . lisinopril  2.5 mg Oral Daily  . magnesium oxide  400 mg Oral Daily  . multivitamin with minerals  1 tablet Oral Daily  . polyethylene glycol  17 g Oral BID  . potassium chloride  10 mEq Oral Daily   Continuous Infusions: . meropenem (MERREM) IV 1 g (09/14/17 1828)     LOS: 2 days    Time spent: 35 mins    Irine Seal, MD [facility] Pager [phone]  If 7PM-7AM, please contact night-coverage [email or web address] Password [facility] 09/14/2017, 8:39 PM

## 2017-09-14 NOTE — Progress Notes (Signed)
Pharmacy Antibiotic Note  [name] is a 57 y.o. female admitted on 09/11/2017 with UTI.  She has indwelling foley catheter & hx ESBL UTI.  Pharmacy has been consulted for meropenem dosing. Urology recommending patient complete 7days antibiotics prior to surgical procedures.  09/14/2017:  Day#4 antibiotics  Afebrile  Leukocytosis resolved  Renal function at patient's baseline (CrCl>70)  Plan: Continue Meropenem 1 Gm IV q8h No dose adjustments anticipated- pharmacy to sign off.  Please re-consult if needed.  If appropriate to complete 7d course with oral antibiotics, consider switch to PO Bactrim based on previous cx results.    Height: 5' 5.5" (166.4 cm) Weight: 140 lb 9.8 oz (63.8 kg) IBW/kg (Calculated) : 58.15  Temp (24hrs), Avg:98.3 F (36.8 C), Min:97.7 F (36.5 C), Max:98.7 F (37.1 C)  Recent Labs  Lab 09/11/17 2027 09/12/17 0615 09/13/17 0615 09/14/17 0654  WBC 13.6* 12.2* 10.5 9.0  CREATININE 0.83 0.84 0.77 0.67    Estimated Creatinine Clearance: 70.4 mL/min (by C-G formula based on SCr of 0.67 mg/dL).    No Known Allergies  Antimicrobials this admission: 4/1 meropenem >>   Dose adjustments this admission:  Microbiology results:  4/1 UCx: multiple species present, suggest recollection 4/2 MRSA PCR: neg    Thank you for allowing pharmacy to be a part of this patient's care.  Biagio Borg 09/14/2017 9:05 AM

## 2017-09-14 NOTE — Progress Notes (Signed)
Suprapubic catheter exchanged per MD order, patient tolerated procedure.

## 2017-09-15 DIAGNOSIS — K219 Gastro-esophageal reflux disease without esophagitis: Secondary | ICD-10-CM

## 2017-09-15 LAB — CBC
HEMATOCRIT: 31 % — AB (ref 36.0–46.0)
HEMOGLOBIN: 9.5 g/dL — AB (ref 12.0–15.0)
MCH: 28.5 pg (ref 26.0–34.0)
MCHC: 30.6 g/dL (ref 30.0–36.0)
MCV: 93.1 fL (ref 78.0–100.0)
Platelets: 480 10*3/uL — ABNORMAL HIGH (ref 150–400)
RBC: 3.33 MIL/uL — ABNORMAL LOW (ref 3.87–5.11)
RDW: 17.4 % — ABNORMAL HIGH (ref 11.5–15.5)
WBC: 11.4 10*3/uL — ABNORMAL HIGH (ref 4.0–10.5)

## 2017-09-15 LAB — URINE CULTURE

## 2017-09-15 LAB — BASIC METABOLIC PANEL
ANION GAP: 8 (ref 5–15)
BUN: 13 mg/dL (ref 6–20)
CHLORIDE: 107 mmol/L (ref 101–111)
CO2: 23 mmol/L (ref 22–32)
Calcium: 10.2 mg/dL (ref 8.9–10.3)
Creatinine, Ser: 0.67 mg/dL (ref 0.44–1.00)
GFR calc Af Amer: >60 mL/min (ref >60)
GLUCOSE: 132 mg/dL — AB (ref 65–99)
POTASSIUM: 4.1 mmol/L (ref 3.5–5.1)
Sodium: 138 mmol/L (ref 135–145)

## 2017-09-15 LAB — GLUCOSE, CAPILLARY
GLUCOSE-CAPILLARY: 220 mg/dL — AB (ref 65–99)
Glucose-Capillary: 120 mg/dL — ABNORMAL HIGH (ref 65–99)
Glucose-Capillary: 233 mg/dL — ABNORMAL HIGH (ref 65–99)
Glucose-Capillary: 236 mg/dL — ABNORMAL HIGH (ref 65–99)

## 2017-09-15 NOTE — Progress Notes (Signed)
  Subjective: Patient reports leakage around suprapubic tube site.  No abdominal pain.  Objective: Vital signs in last 24 hours: Temp:  [98.2 F (36.8 C)-98.8 F (37.1 C)] 98.2 F (36.8 C) (04/05 0507) Pulse Rate:  [74-97] 74 (04/05 0507) Resp:  [16-20] 16 (04/05 0507) BP: (102-115)/(64-82) 102/66 (04/05 0507) SpO2:  [94 %-100 %] 100 % (04/05 0507)  Intake/Output from previous day: 04/04 0701 - 04/05 0700 In: 160 [P.O.:60; IV Piggyback:100] Out: 700 [Urine:700] Intake/Output this shift: No intake/output data recorded.  Physical Exam:  Constitutional: Vital signs reviewed. WD WN in NAD   Eyes: PERRL, No scleral icterus.   Cardiovascular: RRR Pulmonary/Chest: Normal effort Abdominal: Soft. Non-tender, non-distended, bowel sounds are normal, no masses, organomegaly, or guarding present.  Suprapubic tube site clean   Lab Results: Recent Labs    09/13/17 0615 09/14/17 0654 09/15/17 0500  HGB 8.2* 9.3* 9.5*  HCT 26.7* 29.8* 31.0*   BMET Recent Labs    09/14/17 0654 09/15/17 0500  NA 139 138  K 4.0 4.1  CL 110 107  CO2 24 23  GLUCOSE 139* 132*  BUN 14 13  CREATININE 0.67 0.67  CALCIUM 10.0 10.2   No results for input(s): LABPT, INR in the last 72 hours. No results for input(s): LABURIN in the last 72 hours. Results for orders placed or performed during the hospital encounter of 09/11/17  Culture, Urine     Status: Abnormal   Collection Time: 09/11/17 10:35 PM  Result Value Ref Range Status   Specimen Description   Final    URINE, RANDOM Performed at [place] [address] [place], [place] [address]    Special Requests   Final    NONE Performed at [facility], [place] [address], [place], [place] [address]    Culture MULTIPLE SPECIES PRESENT, SUGGEST RECOLLECTION (A)  Final   Report Status 09/13/2017 FINAL  Final  MRSA PCR Screening     Status: None   Collection Time: 09/12/17  3:16 AM  Result Value Ref Range  Status   MRSA by PCR NEGATIVE NEGATIVE Final    Comment:        The GeneXpert MRSA Assay (FDA approved for NASAL specimens only), is one component of a comprehensive MRSA colonization surveillance program. It is not intended to diagnose MRSA infection nor to guide or monitor treatment for MRSA infections. Performed at [facility], [place] [address], [place], [place] [address]     Studies/Results: No results found.  Assessment/Plan:   Possible UTI, cultures negative.  Bilateral hydro-, perhaps due to chronic bladder issue.  Renal function is normal.  She is scheduled for Monday for left ureteroscopy, removal of all left ureteral and renal stone burden.  I made her n.p.o. after midnight on Sunday.   LOS: 3 days   [name] 09/15/2017, 7:42 AM

## 2017-09-15 NOTE — Progress Notes (Signed)
PROGRESS NOTE    [name]  OVF:[id number] DOB: [birth year] DOA: 09/11/2017 PCP: Patient, [name]    Brief Narrative:  The patient is a 57 year old female with a complex history including significant nephrolithiasis requiring PCNL, MS, neurogenic bladder requiring suprapubic catheter, and multiple other medical comorbidities who presents the hospital with suprapubic pain.  The patient states that she has been experiencing suprapubic pain around her SP tube for the last month however it worsened over the last few days.  She has no flank pain.  She has no fevers or chills.  She has no nausea or vomiting.  Her still complaint is this pain around her SP tube.  She was found to have bilateral hydroureteronephrosis down to the level of the bladder.  She also had a 3 and 4 mm left ureteral stone with dilation of the ureter distal to the stones.  She also nonobstructing Left nephrolithiasis.  Urology was consulted for further recommendations regarding this.  She was admitted for a presumed urinary tract infection from a culture taken from her SP tube.  Again she has no flank pain.  She has been afebrile with stable vital signs.  Her kidney function is at baseline.  She has no significant white blood cell count.  She has no signs of sepsis.  The patient does have a history of requiring nephrostomy tubes and bilateral percutaneous nephrolithotomy last year.  Her nephrostomy tubes were removed in December 2018.      Assessment & Plan:   Principal Problem:   Pyelonephritis Active Problems:   Acute lower UTI   Multiple sclerosis diagnosed 2002-not on Therapy any longer   GERD without esophagitis   Chronic indwelling Foley catheter   Neurogenic bladder   Controlled diabetes mellitus type 2 with complications ([place])   Left nephrolithiasis   Neuropathic pain   Essential hypertension   Renal calculus   Bilateral hydronephrosis  #1 acute pyelonephritis/CAUTI (presumed secondary to ESBL)/bilateral  pyelitis Initial concern that due to patient's prior history of ESBL UTI patient was subsequently placed empirically on IV Merrem.  Urine cultures with multiple species present.  Repeat UA with cultures with multiple species as well. Patient afebrile.  Leukocytosis fluctuating.  Continue empiric IV antibiotics for a week per urology recommendations and then subsequently patient to undergo urological procedure for nephrolithiasis and bilateral hydronephrosis.  Post procedure and per urology could likely transition to oral antibiotics to complete a 10-14-day course of antibiotic treatment.  2.  Bilateral hydronephrosis/nephrolithiasis Patient seen in consultation by urology and is felt that patient is not exhibiting any signs or symptoms of sepsis at this time.  Urine cultures with multiple species and as such repeat UA with cultures have been ordered with multiple species present.  Patient has been reassessed by urology who are recommending continued IV antibiotics for a week and subsequently patient to undergo cystoscopy, left ureteroscopy, extraction of left ureteral and renal calculi during this hospitalization.  Urology following and appreciate input and recommendations.   3.  Multiple sclerosis with neurogenic bladder, nonambulatory Suprapubic catheter was changed per Urology.  Suprapubic catheter site with dressing saturated with urine.  Will have nurse from urology floor reassess suprapubic catheter site.  Outpatient follow-up.  4.  Diabetes mellitus II Hemoglobin A1c 7.4.  CBGs ranging from 120-220.  Continue to hold oral hypoglycemic agents.  Sliding scale insulin.   5.  Hypertension Continue lisinopril.    DVT prophylaxis: Lovenox Code Status: Full Family Communication: Updated patient.  No family at  bedside. Disposition Plan: Back to skilled nursing facility once medically stable, post urological procedures and when okay with urology.     Consultants:   Urology: Dr.Budzyn  09/12/2017  Procedures:   CT abdomen and pelvis 09/11/2017    Antimicrobials:   IV Merrem 09/11/2017   Subjective: Patient denies any chest pain or shortness of breath.  Abdominal pain improving.  No nausea or emesis.   Objective: Vitals:   09/14/17 0514 09/14/17 1416 09/14/17 2130 09/15/17 0507  BP: 104/65 112/82 115/64 102/66  Pulse: 77 97 82 74  Resp: 19 20 20 16   Temp: 97.7 F (36.5 C) 98.7 F (37.1 C) 98.8 F (37.1 C) 98.2 F (36.8 C)  TempSrc: Oral Oral Oral Oral  SpO2: 95% 94% 100% 100%  Weight:      Height:        Intake/Output Summary (Last 24 hours) at 09/15/2017 1512 Last data filed at 09/15/2017 0532 Gross per 24 hour  Intake 160 ml  Output 300 ml  Net -140 ml   Filed Weights   09/11/17 2134 09/12/17 0150  Weight: 64.9 kg (143 lb) 63.8 kg (140 lb 9.8 oz)    Examination:  General exam: No acute distress. Respiratory system: Lungs are clear to auscultation bilaterally with no crackles, no wheezes, no rhonchi.  Normal respiratory effort.  Cardiovascular system: RRR no murmurs rubs or gallops.  No JVD.  No lower extremity edema.  Gastrointestinal system: Abdomen is nondistended, soft, positive bowel sounds.  Some discomfort with palpation.  No CVA tenderness.  Suprapubic site with dressing saturated with urine.   Central nervous system: Alert and oriented. No focal neurological deficits. Extremities: Symmetric 5 x 5 power. Skin: No rashes, lesions or ulcers Psychiatry: Judgement and insight appear normal. Mood & affect appropriate.     Data Reviewed: I have personally reviewed following labs and imaging studies  CBC: Recent Labs  Lab 09/11/17 2027 09/12/17 0615 09/13/17 0615 09/14/17 0654 09/15/17 0500  WBC 13.6* 12.2* 10.5 9.0 11.4*  HGB 10.4* 9.5* 8.2* 9.3* 9.5*  HCT 33.3* 30.8* 26.7* 29.8* 31.0*  MCV 93.3 93.1 93.4 90.9 93.1  PLT 455* 461* 451* 381 063*   Basic Metabolic Panel: Recent Labs  Lab 09/11/17 2027 09/12/17 0615 09/13/17 0615  09/14/17 0654 09/15/17 0500  NA 140 140 143 139 138  K 4.4 3.9 4.2 4.0 4.1  CL 107 110 111 110 107  CO2 21* 22 24 24 23   GLUCOSE 162* 199* 140* 139* 132*  BUN 16 17 16 14 13   CREATININE 0.83 0.84 0.77 0.67 0.67  CALCIUM 10.5* 10.0 10.3 10.0 10.2   GFR: Estimated Creatinine Clearance: 70.4 mL/min (by C-G formula based on SCr of 0.67 mg/dL). Liver Function Tests: Recent Labs  Lab 09/11/17 2027  AST 16  ALT 15  ALKPHOS 74  BILITOT 0.4  PROT 8.5*  ALBUMIN 3.6   Recent Labs  Lab 09/11/17 2027  LIPASE 43   No results for input(s): AMMONIA in the last 168 hours. Coagulation Profile: No results for input(s): INR, PROTIME in the last 168 hours. Cardiac Enzymes: No results for input(s): CKTOTAL, CKMB, CKMBINDEX, TROPONINI in the last 168 hours. BNP (last 3 results) No results for input(s): PROBNP in the last 8760 hours. HbA1C: Recent Labs    09/14/17 0654  HGBA1C 7.4*   CBG: Recent Labs  Lab 09/14/17 1151 09/14/17 1808 09/14/17 2300 09/15/17 0742 09/15/17 1141  GLUCAP 161* 233* 141* 120* 220*   Lipid Profile: No results for input(s): CHOL,  HDL, LDLCALC, TRIG, CHOLHDL, LDLDIRECT in the last 72 hours. Thyroid Function Tests: No results for input(s): TSH, T4TOTAL, FREET4, T3FREE, THYROIDAB in the last 72 hours. Anemia Panel: Recent Labs    09/13/17 0615  VITAMINB12 1,010*  FOLATE 7.9  FERRITIN 174  TIBC 216*  IRON 47   Sepsis Labs: No results for input(s): PROCALCITON, LATICACIDVEN in the last 168 hours.  Recent Results (from the past 240 hour(s))  Culture, Urine     Status: Abnormal   Collection Time: 09/11/17 10:35 PM  Result Value Ref Range Status   Specimen Description   Final    URINE, RANDOM Performed at [facility] [address], [place], [place] [address]    Special Requests   Final    NONE Performed at [facility], [place] [address] [place], [place] [address]    Culture MULTIPLE SPECIES PRESENT,  SUGGEST RECOLLECTION (A)  Final   Report Status 09/13/2017 FINAL  Final  MRSA PCR Screening     Status: None   Collection Time: 09/12/17  3:16 AM  Result Value Ref Range Status   MRSA by PCR NEGATIVE NEGATIVE Final    Comment:        The GeneXpert MRSA Assay (FDA approved for NASAL specimens only), is one component of a comprehensive MRSA colonization surveillance program. It is not intended to diagnose MRSA infection nor to guide or monitor treatment for MRSA infections. Performed at [facility], [place] [address], [place], [place] [address]   Culture, Urine     Status: Abnormal   Collection Time: 09/13/17 10:12 PM  Result Value Ref Range Status   Specimen Description   Final    URINE, RANDOM Performed at [place] [address] [place], [place] [address]    Special Requests   Final    NONE Performed at [facility], [place] [address], [place], [place] [address]    Culture MULTIPLE SPECIES PRESENT, SUGGEST RECOLLECTION (A)  Final   Report Status 09/15/2017 FINAL  Final         Radiology Studies: No results found.      Scheduled Meds: . atorvastatin  10 mg Oral QHS  . enoxaparin (LOVENOX) injection  40 mg Subcutaneous Q24H  . famotidine  20 mg Oral BID  . ferrous sulfate  325 mg Oral BID  . gabapentin  100 mg Oral QHS  . insulin aspart  0-15 Units Subcutaneous TID WC  . lisinopril  2.5 mg Oral Daily  . magnesium oxide  400 mg Oral Daily  . multivitamin with minerals  1 tablet Oral Daily  . polyethylene glycol  17 g Oral BID  . potassium chloride  10 mEq Oral Daily   Continuous Infusions: . meropenem (MERREM) IV Stopped (09/15/17 0900)     LOS: 3 days    Time spent: 35 mins    Irine Seal, MD [facility] Pager [phone]  If 7PM-7AM, please contact night-coverage [email or web address] Password [email or web address] 09/15/2017, 3:12 PM

## 2017-09-15 NOTE — Progress Notes (Signed)
S/P Cath noted with small urine leak around site. Site is draining into urine bag. Cleaned site and applied new split gauze. Will pass to day shift to have MD assess. Will c/t monitor.

## 2017-09-15 NOTE — Progress Notes (Signed)
LCSW following for SNF placement.   Patient from [facility].   Patient not ready for dc. Patient has procedure on Monday.   LCSW will continue to follow.   Carolin Coy [place]

## 2017-09-15 NOTE — Progress Notes (Signed)
Patients supapubic site assessed due to increased drainage around site. Dressing was saturated with urine and pooling into bed. Dressing removed.Patient complained of pain and pressure In her bottom. Unable to visualize vaginal area. Pt was then rolled onto left side. Balloon was visualized from pts backside. 4th floor RN is uncertain of balloon exit Urethra or vaginal opening possible. Balloon was deflated, 8cc. Cathter was retracted and balloon re inflated with 10 cc. Pt now denies pain and or discomfort. Primary RN updated and Hospitalist provider. Urologist assigned paged. Will page on call. New dressing applied.

## 2017-09-16 DIAGNOSIS — N1330 Unspecified hydronephrosis: Secondary | ICD-10-CM

## 2017-09-16 DIAGNOSIS — N12 Tubulo-interstitial nephritis, not specified as acute or chronic: Secondary | ICD-10-CM

## 2017-09-16 DIAGNOSIS — I10 Essential (primary) hypertension: Secondary | ICD-10-CM

## 2017-09-16 DIAGNOSIS — N390 Urinary tract infection, site not specified: Secondary | ICD-10-CM

## 2017-09-16 LAB — BASIC METABOLIC PANEL
ANION GAP: 7 (ref 5–15)
BUN: 15 mg/dL (ref 6–20)
CALCIUM: 10.1 mg/dL (ref 8.9–10.3)
CO2: 26 mmol/L (ref 22–32)
Chloride: 110 mmol/L (ref 101–111)
Creatinine, Ser: 0.62 mg/dL (ref 0.44–1.00)
Glucose, Bld: 128 mg/dL — ABNORMAL HIGH (ref 65–99)
Potassium: 4.1 mmol/L (ref 3.5–5.1)
Sodium: 143 mmol/L (ref 135–145)

## 2017-09-16 LAB — CBC
HEMATOCRIT: 27.4 % — AB (ref 36.0–46.0)
Hemoglobin: 8.6 g/dL — ABNORMAL LOW (ref 12.0–15.0)
MCH: 29.3 pg (ref 26.0–34.0)
MCHC: 31.4 g/dL (ref 30.0–36.0)
MCV: 93.2 fL (ref 78.0–100.0)
PLATELETS: 454 10*3/uL — AB (ref 150–400)
RBC: 2.94 MIL/uL — AB (ref 3.87–5.11)
RDW: 17.3 % — AB (ref 11.5–15.5)
WBC: 9.7 10*3/uL (ref 4.0–10.5)

## 2017-09-16 LAB — GLUCOSE, CAPILLARY
GLUCOSE-CAPILLARY: 176 mg/dL — AB (ref 65–99)
Glucose-Capillary: 145 mg/dL — ABNORMAL HIGH (ref 65–99)
Glucose-Capillary: 209 mg/dL — ABNORMAL HIGH (ref 65–99)
Glucose-Capillary: 212 mg/dL — ABNORMAL HIGH (ref 65–99)

## 2017-09-16 MED ORDER — FLUCONAZOLE 100 MG PO TABS
100.0000 mg | ORAL_TABLET | Freq: Every day | ORAL | Status: DC
  Administered 2017-09-16 – 2017-09-19 (×3): 100 mg via ORAL
  Filled 2017-09-16 (×4): qty 1

## 2017-09-16 NOTE — Progress Notes (Signed)
[facility] PROGRESS NOTE  [name] GXQ:[id number] DOB: 1960/11/03 DOA: 09/11/2017  PCP: Patient, [name]  Brief History/Interval Summary: This is a 57 year old female with a complex history including significant nephrolithiasis requiring PCNL, MS, neurogenic bladder requiring suprapubic catheter, and multiple other medical comorbidities who noted with suprapubic pain.  She was found to have bilateral hydroureteronephrosis.  She was found to have ureteral stones.  Urology was consulted.  She was admitted for presumed urinary tract infection in this complicated setting.  Patient also has a previous history of nephrostomy tubes and bilateral percutaneous nephro lithotomy.  Urology plans to perform left ureteroscopy and removal of the stones on Monday 4/8.   Reason for Visit: Conjugated UTI.  Consultants: Urology  Procedures: None  Antibiotics: Currently on meropenem  Subjective/Interval History: Patient states that she is experiencing a lot of leakage of urine around her suprapubic catheter site.  Denies any nausea vomiting.  Some abdominal pain.  No chills.  ROS: No chest pain or shortness of breath  Objective:  Vital Signs  Vitals:   09/15/17 2116 09/16/17 0535 09/16/17 0745 09/16/17 0929  BP: 116/62 109/61 119/75 104/62  Pulse: 89 81 72 90  Resp: 20 18 19 18   Temp: 98.5 F (36.9 C) 97.8 F (36.6 C) 98.4 F (36.9 C) 98.3 F (36.8 C)  TempSrc: Oral Oral Oral Oral  SpO2: 100% 100% 100% 100%  Weight:      Height:        Intake/Output Summary (Last 24 hours) at 09/16/2017 1319 Last data filed at 09/16/2017 0920 Gross per 24 hour  Intake 720 ml  Output 800 ml  Net -80 ml   Filed Weights   09/11/17 2134 09/12/17 0150  Weight: 64.9 kg (143 lb) 63.8 kg (140 lb 9.8 oz)    General appearance: alert, cooperative, appears stated age and no distress Head: Normocephalic, without obvious abnormality, atraumatic Resp: clear to auscultation bilaterally Cardio:  regular rate and rhythm, S1, S2 normal, no murmur, click, rub or gallop GI: Abdomen is tender in the suprapubic area.  No rebound rigidity or guarding.  Suprapubic catheter is noted.  Leakage of urine is noted. Extremities: Chronic skin changes and edema noted both lower extremities Pulses: 2+ and symmetric Neurologic: No focal deficits  Lab Results:  Data Reviewed: I have personally reviewed following labs and imaging studies  CBC: Recent Labs  Lab 09/12/17 0615 09/13/17 0615 09/14/17 0654 09/15/17 0500 09/16/17 0559  WBC 12.2* 10.5 9.0 11.4* 9.7  HGB 9.5* 8.2* 9.3* 9.5* 8.6*  HCT 30.8* 26.7* 29.8* 31.0* 27.4*  MCV 93.1 93.4 90.9 93.1 93.2  PLT 461* 451* 381 480* 454*    Basic Metabolic Panel: Recent Labs  Lab 09/12/17 0615 09/13/17 0615 09/14/17 0654 09/15/17 0500 09/16/17 0559  NA 140 143 139 138 143  K 3.9 4.2 4.0 4.1 4.1  CL 110 111 110 107 110  CO2 22 24 24 23 26   GLUCOSE 199* 140* 139* 132* 128*  BUN 17 16 14 13 15   CREATININE 0.84 0.77 0.67 0.67 0.62  CALCIUM 10.0 10.3 10.0 10.2 10.1    GFR: Estimated Creatinine Clearance: 70.4 mL/min (by C-G formula based on SCr of 0.62 mg/dL).  Liver Function Tests: Recent Labs  Lab 09/11/17 2027  AST 16  ALT 15  ALKPHOS 74  BILITOT 0.4  PROT 8.5*  ALBUMIN 3.6    Recent Labs  Lab 09/11/17 2027  LIPASE 43   HbA1C: Recent Labs    09/14/17 0654  HGBA1C 7.4*    CBG: Recent Labs  Lab 09/15/17 1141 09/15/17 1622 09/15/17 2114 09/16/17 0754 09/16/17 1125  GLUCAP 220* 233* 236* 145* 176*     Recent Results (from the past 240 hour(s))  Culture, Urine     Status: Abnormal   Collection Time: 09/11/17 10:35 PM  Result Value Ref Range Status   Specimen Description   Final    URINE, RANDOM Performed at [facility] [address], [place], [place] [address]    Special Requests   Final    NONE Performed at [facility], [place] [address], [place], [place]  [address]    Culture MULTIPLE SPECIES PRESENT, SUGGEST RECOLLECTION (A)  Final   Report Status 09/13/2017 FINAL  Final  MRSA PCR Screening     Status: None   Collection Time: 09/12/17  3:16 AM  Result Value Ref Range Status   MRSA by PCR NEGATIVE NEGATIVE Final    Comment:        The GeneXpert MRSA Assay (FDA approved for NASAL specimens only), is one component of a comprehensive MRSA colonization surveillance program. It is not intended to diagnose MRSA infection nor to guide or monitor treatment for MRSA infections. Performed at [facility], [place] [address], [place], [place] [address]   Culture, Urine     Status: Abnormal   Collection Time: 09/13/17 10:12 PM  Result Value Ref Range Status   Specimen Description   Final    URINE, RANDOM Performed at [facility] [address], [place], [place] [address]    Special Requests   Final    NONE Performed at [facility], [place] [address] [place], [place] [address]    Culture MULTIPLE SPECIES PRESENT, SUGGEST RECOLLECTION (A)  Final   Report Status 09/15/2017 FINAL  Final      Radiology Studies: No results found.   Medications:  Scheduled: . atorvastatin  10 mg Oral QHS  . enoxaparin (LOVENOX) injection  40 mg Subcutaneous Q24H  . famotidine  20 mg Oral BID  . ferrous sulfate  325 mg Oral BID  . gabapentin  100 mg Oral QHS  . insulin aspart  0-15 Units Subcutaneous TID WC  . lisinopril  2.5 mg Oral Daily  . magnesium oxide  400 mg Oral Daily  . multivitamin with minerals  1 tablet Oral Daily  . polyethylene glycol  17 g Oral BID  . potassium chloride  10 mEq Oral Daily   Continuous: . meropenem (MERREM) IV Stopped (09/16/17 0905)   TIW:[id number] **OR** acetaminophen, albuterol, ondansetron **OR** ondansetron (ZOFRAN) IV  Assessment/Plan:  Principal Problem:   Pyelonephritis Active Problems:   Multiple sclerosis diagnosed 2002-not on Therapy any  longer   GERD without esophagitis   Chronic indwelling Foley catheter   Neurogenic bladder   Controlled diabetes mellitus type 2 with complications ([place])   Left nephrolithiasis   Neuropathic pain   Essential hypertension   Renal calculus   Bilateral hydronephrosis   Acute lower UTI    Acute pyelonephritis/catheter associated UTI Patient with previous history of ESBL infection.  Patient was empirically placed on meropenem which is being continued.  Urine cultures in the hospital unfortunately have grown multiple species without any specific organism.  Urology is following and recommending 1 week of IV antibiotic and then subsequently to undergo urological procedure.  Postprocedure patient will need 10-14-day course of antibiotic.  Meropenem was initiated on 4/1.  Surgery is planned for Monday.  Bilateral hydronephrosis/nephrolithiasis Management per urology.  Surgical procedure planned for Monday.  History of multiple sclerosis with neurogenic bladder She is nonambulatory.  Not on treatment at this time.  Diabetes mellitus type 2 HbA1c 7.4.  CBGs stable.  Continue sliding scale coverage.  Continue to hold oral hypoglycemic agents.  History of essential hypertension Continue with lisinopril.  Normocytic anemia Hemoglobin stable.  Mild drop is likely dilutional.  No overt bleeding noted.   DVT Prophylaxis: Lovenox    Code Status: Full code Family Communication: Discussed with the patient Disposition Plan: Management as outlined above.  She is here from a skilled nursing facility and will most likely return when ready for discharge.   LOS: 4 days   [place] Pager [phone] 09/16/2017, 1:19 PM  If 7PM-7AM, please contact night-coverage at [email or web address], password [facility]

## 2017-09-17 DIAGNOSIS — D649 Anemia, unspecified: Secondary | ICD-10-CM

## 2017-09-17 LAB — GLUCOSE, CAPILLARY
GLUCOSE-CAPILLARY: 181 mg/dL — AB (ref 65–99)
GLUCOSE-CAPILLARY: 178 mg/dL — AB (ref 65–99)
GLUCOSE-CAPILLARY: 165 mg/dL — AB (ref 65–99)
Glucose-Capillary: 103 mg/dL — ABNORMAL HIGH (ref 65–99)

## 2017-09-17 MED ORDER — BACITRACIN-NEOMYCIN-POLYMYXIN OINTMENT TUBE
TOPICAL_OINTMENT | Freq: Two times a day (BID) | CUTANEOUS | Status: DC
  Administered 2017-09-17 – 2017-09-19 (×4): via TOPICAL
  Filled 2017-09-17: qty 14.17

## 2017-09-17 MED ORDER — NEOMYCIN-POLYMYXIN-PRAMOXINE 1 % EX CREA: TOPICAL_CREAM | Freq: Two times a day (BID) | CUTANEOUS | Status: DC

## 2017-09-17 NOTE — Progress Notes (Addendum)
Subjective: Patient reports no specific complaints. No fever. No flank pain.  Her suprapubic tube was repositioned Friday aa the tip was protruding through the meatus which easily occurs in women with suprapubic tubes.  It was backed up and seems to be draining better now, but still leaking some around it. Dr. Diona Fanti is planning to take patient tomorrow for cysto, retrogrades and poss left ureteroscopy, holmium laser lithotripsy and stent placement. I reviewed her CT images. WBC normalized yesterday. Pt remains AFVSS.   Objective: Vital signs in last 24 hours: Temp:  [97.8 F (36.6 C)-98.6 F (37 C)] 97.8 F (36.6 C) (04/07 1209) Pulse Rate:  [80-95] 91 (04/07 1209) Resp:  [15-18] 16 (04/07 1209) BP: (115-137)/(65-75) 117/73 (04/07 1209) SpO2:  [97 %-100 %] 100 % (04/07 1209) Weight:  [63.5 kg (140 lb)] 63.5 kg (140 lb) (04/06 1400)  Intake/Output from previous day: 04/06 0701 - 04/07 0700 In: 960 [P.O.:960] Out: 800 [Urine:800] Intake/Output this shift: Total I/O In: 120 [P.O.:120] Out: -   Physical Exam:  NAD Watching TV Abd - soft, NT SP tube seems to be in good position, clear urine in bag  Lab Results: Recent Labs    09/15/17 0500 09/16/17 0559  HGB 9.5* 8.6*  HCT 31.0* 27.4*   BMET Recent Labs    09/15/17 0500 09/16/17 0559  NA 138 143  K 4.1 4.1  CL 107 110  CO2 23 26  GLUCOSE 132* 128*  BUN 13 15  CREATININE 0.67 0.62  CALCIUM 10.2 10.1   No results for input(s): LABPT, INR in the last 72 hours. No results for input(s): LABURIN in the last 72 hours. Results for orders placed or performed during the hospital encounter of 09/11/17  Culture, Urine     Status: Abnormal   Collection Time: 09/11/17 10:35 PM  Result Value Ref Range Status   Specimen Description   Final    URINE, RANDOM Performed at [facility] [address] [place], [place] [address]    Special Requests   Final    NONE Performed at [facility], [place] [address] [place], [place] [address]    Culture MULTIPLE SPECIES PRESENT, SUGGEST RECOLLECTION (A)  Final   Report Status 09/13/2017 FINAL  Final  MRSA PCR Screening     Status: None   Collection Time: 09/12/17  3:16 AM  Result Value Ref Range Status   MRSA by PCR NEGATIVE NEGATIVE Final    Comment:        The GeneXpert MRSA Assay (FDA approved for NASAL specimens only), is one component of a comprehensive MRSA colonization surveillance program. It is not intended to diagnose MRSA infection nor to guide or monitor treatment for MRSA infections. Performed at [facility], [place] [address], [place], [place] [address]   Culture, Urine     Status: Abnormal   Collection Time: 09/13/17 10:12 PM  Result Value Ref Range Status   Specimen Description   Final    URINE, RANDOM Performed at [facility] [address], [place], [place] [address]    Special Requests   Final    NONE Performed at [facility], [place] [address] [place], [place] [address]    Culture MULTIPLE SPECIES PRESENT, SUGGEST RECOLLECTION (A)  Final   Report Status 09/15/2017 FINAL  Final    Studies/Results: No results found.  Assessment/Plan:  Possible UTI, cultures with mixed growth -- on meropenem. AFVSS.  Bilateral hydro, 4 mm  left distal stone and small left renal stones - perhaps due to chronic bladder issue.  Renal function is normal.  She is scheduled for Monday/tomorrow for cystoscopy, bilateral retrograde pyelograms, left ureteroscopy, poss laser lithoptripsy and stone basket extraction to remove all left ureteral and renal stone burden. The SP position will be checked during the procedure. I made her n.p.o. after midnight.     LOS: 5 days   Festus Aloe 09/17/2017, 12:10 PM

## 2017-09-17 NOTE — Progress Notes (Signed)
[facility] PROGRESS NOTE  [name] ATF:[id number] DOB: May 17, 1961 DOA: 09/11/2017  PCP: Patient, [name]  Brief History/Interval Summary: This is a 57 year old female with a complex history including significant nephrolithiasis requiring PCNL, MS, neurogenic bladder requiring suprapubic catheter, and multiple other medical comorbidities who noted with suprapubic pain.  She was found to have bilateral hydroureteronephrosis.  She was found to have ureteral stones.  Urology was consulted.  She was admitted for presumed urinary tract infection in this complicated setting.  Patient also has a previous history of nephrostomy tubes and bilateral percutaneous nephro lithotomy.  Urology plans to perform left ureteroscopy and removal of the stones on Monday 4/8.  Reason for Visit: Conjugated UTI.  Consultants: Urology  Procedures: None  Antibiotics: Currently on meropenem  Subjective/Interval History: Patient continues to complain of leakage of urine around her suprapubic catheter site.  She also mentions that she appears to have developed itchy rash in her neck area.    ROS: No chest pain or shortness of breath.  Objective:  Vital Signs  Vitals:   09/16/17 1400 09/16/17 1436 09/16/17 2145 09/17/17 0648  BP: 137/75 122/65 124/71 115/65  Pulse: 95 88 86 80  Resp: 18 18 18 15   Temp: 98.6 F (37 C) 98.6 F (37 C) 98.6 F (37 C) 97.9 F (36.6 C)  TempSrc: Oral Oral Oral Oral  SpO2: 100% 100% 97% 100%  Weight: 63.5 kg (140 lb)     Height:        Intake/Output Summary (Last 24 hours) at 09/17/2017 0929 Last data filed at 09/16/2017 1400 Gross per 24 hour  Intake 480 ml  Output -  Net 480 ml   Filed Weights   09/11/17 2134 09/12/17 0150 09/16/17 1400  Weight: 64.9 kg (143 lb) 63.8 kg (140 lb 9.8 oz) 63.5 kg (140 lb)    General appearance: Awake alert.  In no distress Resp: Clear to auscultation bilaterally. Cardio: S1-S2 is normal regular.  No S3-S4.  No rubs  murmurs or bruit GI: Tenderness is noted in the suprapubic area.  No rebound rigidity or guarding.  Suprapubic catheter is noted. Extremities: Chronic skin changes and edema in both lower extremities. Skin: Changes suggestive of folliculitis noted in the neck area. Pulses: 2+ and symmetric Neurologic: No focal deficits  Lab Results:  Data Reviewed: I have personally reviewed following labs and imaging studies  CBC: Recent Labs  Lab 09/12/17 0615 09/13/17 0615 09/14/17 0654 09/15/17 0500 09/16/17 0559  WBC 12.2* 10.5 9.0 11.4* 9.7  HGB 9.5* 8.2* 9.3* 9.5* 8.6*  HCT 30.8* 26.7* 29.8* 31.0* 27.4*  MCV 93.1 93.4 90.9 93.1 93.2  PLT 461* 451* 381 480* 454*    Basic Metabolic Panel: Recent Labs  Lab 09/12/17 0615 09/13/17 0615 09/14/17 0654 09/15/17 0500 09/16/17 0559  NA 140 143 139 138 143  K 3.9 4.2 4.0 4.1 4.1  CL 110 111 110 107 110  CO2 22 24 24 23 26   GLUCOSE 199* 140* 139* 132* 128*  BUN 17 16 14 13 15   CREATININE 0.84 0.77 0.67 0.67 0.62  CALCIUM 10.0 10.3 10.0 10.2 10.1    GFR: Estimated Creatinine Clearance: 70.4 mL/min (by C-G formula based on SCr of 0.62 mg/dL).  Liver Function Tests: Recent Labs  Lab 09/11/17 2027  AST 16  ALT 15  ALKPHOS 74  BILITOT 0.4  PROT 8.5*  ALBUMIN 3.6    Recent Labs  Lab 09/11/17 2027  LIPASE 43   CBG: Recent Labs  Lab 09/16/17 0754 09/16/17 1125 09/16/17 1654 09/16/17 2142 09/17/17 0748  GLUCAP 145* 176* 209* 212* 103*     Recent Results (from the past 240 hour(s))  Culture, Urine     Status: Abnormal   Collection Time: 09/11/17 10:35 PM  Result Value Ref Range Status   Specimen Description   Final    URINE, RANDOM Performed at [facility], [place] [address], [place], [place] [address]    Special Requests   Final    NONE Performed at [facility], [place] [address], [place], [place] [address]    Culture MULTIPLE SPECIES PRESENT, SUGGEST RECOLLECTION (A)  Final     Report Status 09/13/2017 FINAL  Final  MRSA PCR Screening     Status: None   Collection Time: 09/12/17  3:16 AM  Result Value Ref Range Status   MRSA by PCR NEGATIVE NEGATIVE Final    Comment:        The GeneXpert MRSA Assay (FDA approved for NASAL specimens only), is one component of a comprehensive MRSA colonization surveillance program. It is not intended to diagnose MRSA infection nor to guide or monitor treatment for MRSA infections. Performed at [facility], [place] [address] [place], [place] [address]   Culture, Urine     Status: Abnormal   Collection Time: 09/13/17 10:12 PM  Result Value Ref Range Status   Specimen Description   Final    URINE, RANDOM Performed at [facility] [address] [place], [place] [address]    Special Requests   Final    NONE Performed at [facility], [place] [address], [place],  [address]    Culture MULTIPLE SPECIES PRESENT, SUGGEST RECOLLECTION (A)  Final   Report Status 09/15/2017 FINAL  Final      Radiology Studies: No results found.   Medications:  Scheduled: . atorvastatin  10 mg Oral QHS  . enoxaparin (LOVENOX) injection  40 mg Subcutaneous Q24H  . famotidine  20 mg Oral BID  . ferrous sulfate  325 mg Oral BID  . fluconazole  100 mg Oral Daily  . gabapentin  100 mg Oral QHS  . insulin aspart  0-15 Units Subcutaneous TID WC  . lisinopril  2.5 mg Oral Daily  . magnesium oxide  400 mg Oral Daily  . multivitamin with minerals  1 tablet Oral Daily  . neomycin-polymyxin-pramoxine   Topical BID  . polyethylene glycol  17 g Oral BID  . potassium chloride  10 mEq Oral Daily   Continuous: . meropenem (MERREM) IV Stopped (09/16/17 1644)   EZM:[id number] **OR** acetaminophen, albuterol, ondansetron **OR** ondansetron (ZOFRAN) IV  Assessment/Plan:  Principal Problem:   Pyelonephritis Active Problems:   Multiple sclerosis diagnosed 2002-not on Therapy any  longer   GERD without esophagitis   Chronic indwelling Foley catheter   Neurogenic bladder   Controlled diabetes mellitus type 2 with complications ([place])   Left nephrolithiasis   Neuropathic pain   Essential hypertension   Renal calculus   Bilateral hydronephrosis   Acute lower UTI    Acute pyelonephritis/catheter associated UTI Patient with previous history of ESBL infection.  Patient was empirically placed on meropenem which is being continued.  Urine cultures in the hospital unfortunately have grown multiple species without any specific organism.  Urology is following and recommending 1 week of IV antibiotic and then subsequently to undergo urological procedure.  Postprocedure patient will need 10-14-day course of antibiotic.  Meropenem was initiated on  4/1.  Surgery is planned for Monday.  Due to her significant history of ESBL and lack of any growth during this hospitalization we may have to continue treatment with intravenous antibiotics after her urological procedure.  Will discuss with urology.  She may need a PICC line.  Bilateral hydronephrosis/nephrolithiasis/suprapubic catheter leakage Management per urology.  Surgical procedure planned for Monday.  History of multiple sclerosis with neurogenic bladder She is nonambulatory.  Not on treatment at this time.  Diabetes mellitus type 2 HbA1c 7.4.  CBGs stable.  Continue sliding scale coverage.  Continue to hold oral hypoglycemic agents.  History of essential hypertension Continue with lisinopril.  Blood pressure is very well controlled.  Normocytic anemia Drop in hemoglobin is likely dilutional.  No overt bleeding is noted.  Continue to monitor.  Folliculitis Erythematous area noted around her neck area.  One pustular lesion also noted.  Use antibacterial cream.  DVT Prophylaxis: Lovenox    Code Status: Full code Family Communication: Discussed with the patient Disposition Plan: Management as outlined above.  She is here  from a skilled nursing facility and will most likely return when ready for discharge.   LOS: 5 days   [place] [facility] Pager [phone] 09/17/2017, 9:29 AM  If 7PM-7AM, please contact night-coverage at [email or web address], password [facility]

## 2017-09-18 ENCOUNTER — Encounter (HOSPITAL_COMMUNITY)
Admission: EM | Disposition: A | Discharge status: Skilled Nursing Facility | Payer: Self-pay | Source: Home / Self Care | Attending: Internal Medicine

## 2017-09-18 ENCOUNTER — Encounter (HOSPITAL_COMMUNITY): Payer: Self-pay | Admitting: Certified Registered"

## 2017-09-18 ENCOUNTER — Inpatient Hospital Stay (HOSPITAL_COMMUNITY): Payer: Medicare Other | Admitting: Anesthesiology

## 2017-09-18 ENCOUNTER — Inpatient Hospital Stay (HOSPITAL_COMMUNITY): Payer: Medicare Other

## 2017-09-18 DIAGNOSIS — E118 Type 2 diabetes mellitus with unspecified complications: Secondary | ICD-10-CM

## 2017-09-18 HISTORY — PX: CYSTOSCOPY/URETEROSCOPY/HOLMIUM LASER/STENT PLACEMENT: SHX6546

## 2017-09-18 HISTORY — PX: CYSTOSCOPY W/ RETROGRADES: SHX1426

## 2017-09-18 LAB — BASIC METABOLIC PANEL
Anion gap: 8 (ref 5–15)
BUN: 18 mg/dL (ref 6–20)
CHLORIDE: 108 mmol/L (ref 101–111)
CO2: 22 mmol/L (ref 22–32)
CREATININE: 0.6 mg/dL (ref 0.44–1.00)
Calcium: 10.3 mg/dL (ref 8.9–10.3)
GFR calc Af Amer: >60 mL/min (ref >60)
GFR calc non Af Amer: >60 mL/min (ref >60)
GLUCOSE: 154 mg/dL — AB (ref 65–99)
POTASSIUM: 4.1 mmol/L (ref 3.5–5.1)
SODIUM: 138 mmol/L (ref 135–145)

## 2017-09-18 LAB — GLUCOSE, CAPILLARY
GLUCOSE-CAPILLARY: 155 mg/dL — AB (ref 65–99)
GLUCOSE-CAPILLARY: 141 mg/dL — AB (ref 65–99)
GLUCOSE-CAPILLARY: 131 mg/dL — AB (ref 65–99)
GLUCOSE-CAPILLARY: 192 mg/dL — AB (ref 65–99)
GLUCOSE-CAPILLARY: 311 mg/dL — AB (ref 65–99)
Glucose-Capillary: 136 mg/dL — ABNORMAL HIGH (ref 65–99)

## 2017-09-18 LAB — CBC
HCT: 27.4 % — ABNORMAL LOW (ref 36.0–46.0)
HEMOGLOBIN: 8.6 g/dL — AB (ref 12.0–15.0)
MCH: 29 pg (ref 26.0–34.0)
MCHC: 31.4 g/dL (ref 30.0–36.0)
MCV: 92.3 fL (ref 78.0–100.0)
PLATELETS: 467 10*3/uL — AB (ref 150–400)
RBC: 2.97 MIL/uL — ABNORMAL LOW (ref 3.87–5.11)
RDW: 17.6 % — ABNORMAL HIGH (ref 11.5–15.5)
WBC: 12 10*3/uL — ABNORMAL HIGH (ref 4.0–10.5)

## 2017-09-18 SURGERY — CYSTOSCOPY/URETEROSCOPY/HOLMIUM LASER/STENT PLACEMENT
Anesthesia: General | Site: Ureter | Laterality: Left

## 2017-09-18 MED ORDER — OXYCODONE HCL 5 MG PO TABS: 5.0000 mg | ORAL_TABLET | Freq: Once | ORAL | Status: DC | PRN

## 2017-09-18 MED ORDER — SUCCINYLCHOLINE CHLORIDE 200 MG/10ML IV SOSY
PREFILLED_SYRINGE | INTRAVENOUS | Status: AC
  Filled 2017-09-18: qty 10

## 2017-09-18 MED ORDER — PROPOFOL 10 MG/ML IV BOLUS
INTRAVENOUS | Status: DC | PRN
  Administered 2017-09-18: 130 mg via INTRAVENOUS

## 2017-09-18 MED ORDER — SODIUM CHLORIDE 0.9 % IR SOLN
Status: DC | PRN
  Administered 2017-09-18: 3000 mL

## 2017-09-18 MED ORDER — EPHEDRINE 5 MG/ML INJ
INTRAVENOUS | Status: AC
  Filled 2017-09-18: qty 10

## 2017-09-18 MED ORDER — PROPOFOL 10 MG/ML IV BOLUS
INTRAVENOUS | Status: AC
  Filled 2017-09-18: qty 20

## 2017-09-18 MED ORDER — OXYCODONE HCL 5 MG/5ML PO SOLN
5.0000 mg | Freq: Once | ORAL | Status: DC | PRN
  Filled 2017-09-18: qty 5

## 2017-09-18 MED ORDER — PHENYLEPHRINE HCL 10 MG/ML IJ SOLN
INTRAMUSCULAR | Status: AC
  Filled 2017-09-18: qty 1

## 2017-09-18 MED ORDER — DEXAMETHASONE SODIUM PHOSPHATE 10 MG/ML IJ SOLN
INTRAMUSCULAR | Status: DC | PRN
  Administered 2017-09-18: 5 mg via INTRAVENOUS

## 2017-09-18 MED ORDER — MIDAZOLAM HCL 2 MG/2ML IJ SOLN
INTRAMUSCULAR | Status: DC | PRN
  Administered 2017-09-18: 1 mg via INTRAVENOUS

## 2017-09-18 MED ORDER — FENTANYL CITRATE (PF) 250 MCG/5ML IJ SOLN
INTRAMUSCULAR | Status: DC | PRN
  Administered 2017-09-18 (×2): 50 ug via INTRAVENOUS

## 2017-09-18 MED ORDER — DEXAMETHASONE SODIUM PHOSPHATE 10 MG/ML IJ SOLN
INTRAMUSCULAR | Status: AC
  Filled 2017-09-18: qty 1

## 2017-09-18 MED ORDER — LIDOCAINE 2% (20 MG/ML) 5 ML SYRINGE
INTRAMUSCULAR | Status: DC | PRN
  Administered 2017-09-18: 80 mg via INTRAVENOUS

## 2017-09-18 MED ORDER — OXYBUTYNIN CHLORIDE 5 MG PO TABS
5.0000 mg | ORAL_TABLET | Freq: Three times a day (TID) | ORAL | Status: DC | PRN
Start: 2017-09-18 — End: 2017-09-18

## 2017-09-18 MED ORDER — ONDANSETRON HCL 4 MG/2ML IJ SOLN
INTRAMUSCULAR | Status: DC | PRN
  Administered 2017-09-18: 4 mg via INTRAVENOUS

## 2017-09-18 MED ORDER — MIDAZOLAM HCL 2 MG/2ML IJ SOLN
INTRAMUSCULAR | Status: AC
  Filled 2017-09-18: qty 2

## 2017-09-18 MED ORDER — PROMETHAZINE HCL 25 MG/ML IJ SOLN: 6.2500 mg | INTRAMUSCULAR | Status: DC | PRN

## 2017-09-18 MED ORDER — HYDROMORPHONE HCL 1 MG/ML IJ SOLN: 0.2500 mg | INTRAMUSCULAR | Status: DC | PRN

## 2017-09-18 MED ORDER — FENTANYL CITRATE (PF) 250 MCG/5ML IJ SOLN
INTRAMUSCULAR | Status: AC
  Filled 2017-09-18: qty 5

## 2017-09-18 MED ORDER — PHENYLEPHRINE 40 MCG/ML (10ML) SYRINGE FOR IV PUSH (FOR BLOOD PRESSURE SUPPORT)
PREFILLED_SYRINGE | INTRAVENOUS | Status: AC
  Filled 2017-09-18: qty 10

## 2017-09-18 MED ORDER — LACTATED RINGERS IV SOLN
INTRAVENOUS | Status: DC
  Administered 2017-09-18 (×3): via INTRAVENOUS

## 2017-09-18 MED ORDER — LIDOCAINE 2% (20 MG/ML) 5 ML SYRINGE
INTRAMUSCULAR | Status: AC
  Filled 2017-09-18: qty 5

## 2017-09-18 MED ORDER — PHENYLEPHRINE HCL 10 MG/ML IJ SOLN
INTRAVENOUS | Status: DC | PRN
  Administered 2017-09-18: 25 ug/min via INTRAVENOUS

## 2017-09-18 MED ORDER — KETOROLAC TROMETHAMINE 30 MG/ML IJ SOLN
INTRAMUSCULAR | Status: AC
  Filled 2017-09-18: qty 1

## 2017-09-18 MED ORDER — ONDANSETRON HCL 4 MG/2ML IJ SOLN
INTRAMUSCULAR | Status: AC
  Filled 2017-09-18: qty 2

## 2017-09-18 MED ORDER — PHENYLEPHRINE 40 MCG/ML (10ML) SYRINGE FOR IV PUSH (FOR BLOOD PRESSURE SUPPORT)
PREFILLED_SYRINGE | INTRAVENOUS | Status: DC | PRN
  Administered 2017-09-18 (×2): 80 ug via INTRAVENOUS
  Administered 2017-09-18: 120 ug via INTRAVENOUS

## 2017-09-18 SURGICAL SUPPLY — 23 items
BAG URO CATCHER STRL LF (MISCELLANEOUS) ×4 IMPLANT
BASKET LASER NITINOL 1.9FR (BASKET) ×4 IMPLANT
BASKET ZERO TIP NITINOL 2.4FR (BASKET) IMPLANT
CATH INTERMIT  6FR 70CM (CATHETERS) IMPLANT
CLOTH BEACON ORANGE TIMEOUT ST (SAFETY) ×4 IMPLANT
COVER FOOTSWITCH UNIV (MISCELLANEOUS) IMPLANT
COVER SURGICAL LIGHT HANDLE (MISCELLANEOUS) ×4 IMPLANT
EXTRACTOR STONE NITINOL NGAGE (UROLOGICAL SUPPLIES) ×4 IMPLANT
FIBER LASER FLEXIVA 365 (UROLOGICAL SUPPLIES) IMPLANT
FIBER LASER TRAC TIP (UROLOGICAL SUPPLIES) IMPLANT
GLOVE BIOGEL M 8.0 STRL (GLOVE) ×4 IMPLANT
GOWN STRL REUS W/ TWL XL LVL3 (GOWN DISPOSABLE) ×2 IMPLANT
GOWN STRL REUS W/TWL LRG LVL3 (GOWN DISPOSABLE) ×8 IMPLANT
GOWN STRL REUS W/TWL XL LVL3 (GOWN DISPOSABLE) ×2
GUIDEWIRE ANG ZIPWIRE 038X150 (WIRE) ×4 IMPLANT
GUIDEWIRE STR DUAL SENSOR (WIRE) ×4 IMPLANT
MANIFOLD NEPTUNE II (INSTRUMENTS) ×4 IMPLANT
PACK CYSTO (CUSTOM PROCEDURE TRAY) ×4 IMPLANT
SHEATH URETERAL 12FRX35CM (MISCELLANEOUS) ×4 IMPLANT
STENT URET 6FRX26 CONTOUR (STENTS) ×4 IMPLANT
TUBING CONNECTING 10 (TUBING) ×3 IMPLANT
TUBING CONNECTING 10' (TUBING) ×1
TUBING UROLOGY SET (TUBING) ×4 IMPLANT

## 2017-09-18 NOTE — Anesthesia Procedure Notes (Signed)
Date/Time: 09/18/2017 1:14 PM Performed by: Cynda Familia, CRNA Oxygen Delivery Method: Simple face mask Placement Confirmation: positive ETCO2 and breath sounds checked- equal and bilateral Dental Injury: Teeth and Oropharynx as per pre-operative assessment

## 2017-09-18 NOTE — Op Note (Signed)
Preoperative diagnosis: Left distal ureteral and left renal calculi, bilateral hydronephrosis  Postoperative diagnosis: Left distal ureteral and renal calculi, bilateral hydronephrosis with no evidence of obstruction  Principal procedure: Cystoscopy, bilateral retrograde ureteral pyelograms, fluoroscopic interpretation, left ureteroscopy, extraction of left ureteral and renal calculi, placement of 6 French by 24 cm contour double-J stent without tether  Surgeon: Miri Jose  Anesthesia: General  Complications: None  Indications: 57 year old female with multiple sclerosis and multiple medical issues from this.  She has a history of bilateral urolithiasis and had multiple stones treated in 2018.  These included both ureteroscopic and percutaneous procedures.  She has an indwelling suprapubic tube for long-term bladder drainage as well as her placement in a skilled nursing facility, this eases urine management.  She recently presented with abdominal pain and urinary tract infection.  She has been on IV antibiotics.  Evaluation included CT scan which revealed left distal ureteral stones, bilateral hydroureteronephrosis, left renal calculi.  It was felt that her hydro-was possibly not secondary to her stones.  She has been managed with IV antibiotics and supportive care.  She presents at this time for ureteroscopic management of her left renal and ureteral calculi, as well as bilateral retrograde ureteral pyelograms to rule out any obstruction.  I have discussed the procedure of ureteroscopy with Dannika, who has had this procedure before.  She understands and desires to proceed.  Findings: Trigonal area revealed mild inflammation, most likely secondary to the tip of the suprapubic tube lying in this area.  No other significant bladder lesions were noted.  Ureteral orifice were somewhat obscured due to this inflammatory urothelium.  Retrograde ureteral pyelograms bilaterally revealed a normal right ureter  throughout, with some small air bubbles that passed into the open-ended catheter.  No filling defects were seen.  There was mild pyelocaliectasis on the right.  Similar findings noted on the left, except that there were approximately 3 filling defects in the distal ureter consistent with ureteral calculi.  There was moderate pyelocaliectasis on the left.  Also, filling defect in the left renal pelvis consistent with a stone.  Description of procedure: The patient was properly identified in the holding area.  She has been on IV antibiotics.  She was taken to the operating room following adequate marking, where general anesthetic was administered.  The patient was placed in the dorsolithotomy position.  Genitalia and perineum were prepped and draped, timeout was performed.  A 22 French panendoscope was placed into the bladder.  Inspection was carried out.  The suprapubic tube balloon was seen in the bladder.  The trigone did appear slightly inflammatory in nature, most likely from catheter irritation.  This somewhat obscures the ureteral orifice ease.  There were saccules noted in the bladder as well.  The left ureteral orifice was cannulated with a 6 [place] open-ended catheter with the help of a guidewire.  This was advanced into the distal ureter.  Omnipaque was used to perform retrograde ureteropyelogram, the results were dictated above.  Following retrograde, a guidewire was advanced into the left renal collecting system, the open-ended catheter and cystoscope were removed.  I then dilated the left distal ureter first with the inner core and then the entire 12/14 ureteral access catheter.  The access catheter was then removed over top of the guidewire.  6 French semirigid ureteroscope was then passed into the distal ureter where the left ureteral stones were seen.  These were small enough to be grasped and brought out through the ureter without fragmentation using  the laser.  The entire stone burden in the  ureter was removed.  The rigid ureteroscope was then advanced more proximally in the ureter, no further stones were seen.  The ureteroscope was then removed.  I then placed the medium ureteral access catheter over top of the guidewire, with the obturator and the guidewire then removed.  The flexible dual-lumen ureteroscope was then passed through the ureteral access catheter.  The entire left pyelocalyceal system was inspected.  2-3 stones were seen.  These were then removed with the engage basket.  Small fragments were also removed as well as possible with the basket, although they were quite tiny and some of these could not be grasped.  There being no further stone burden in the left kidney, I then remove the ureteroscope.  The guidewire was then passed through the access catheter, and the access catheter was removed.  The wire was backloaded to the scope and a 24 cm by 6 [place] contour double-J stent with the tether removed was then  placed using fluoroscopic and cystoscopic guidance.  Adequate positioning was seen following the removal of the guidewire.  I then performed the right retrograde ureteropyelogram using a 6 [place] open-ended catheter with the help of the sensor tip guidewire.  The above-mentioned findings were noted.  There being no evidence of obstruction, and with the contrast quickly being drained from the ureter, I terminated the procedure.  The scope was removed.  The patient was then awakened and taken to the PACU in stable condition, having tolerated the procedure well.

## 2017-09-18 NOTE — Progress Notes (Signed)
Pt ready for surgery. All questions were answered.

## 2017-09-18 NOTE — Anesthesia Postprocedure Evaluation (Signed)
Anesthesia Post Note  Patient: [name]  Procedure(s) Performed: CYSTOSCOPY/LEFTURETEROSCOPY/URETERAL STENT PLACEMENT STONE EXTRACTION (Left Ureter) CYSTOSCOPY WITH RETROGRADE PYELOGRAM (Bilateral )     Patient location during evaluation: PACU Anesthesia Type: General Level of consciousness: awake and alert Pain management: pain level controlled Vital Signs Assessment: post-procedure vital signs reviewed and stable Respiratory status: spontaneous breathing, nonlabored ventilation and respiratory function stable Cardiovascular status: blood pressure returned to baseline and stable Postop Assessment: no apparent nausea or vomiting Anesthetic complications: no    Last Vitals:  Vitals:   09/18/17 1345 09/18/17 1400  BP: 130/70 132/70  Pulse: 73 72  Resp: 13 14  Temp: 36.7 C 36.8 C  SpO2: 100% 100%    Last Pain:  Vitals:   09/18/17 1400  TempSrc:   PainSc: 0-No pain                 Lynda Rainwater

## 2017-09-18 NOTE — Care Management Important Message (Addendum)
Important Message  Patient Details IM Letter given to [place]/Case Manager to present to the Patient Name: [name] MRN: [id number] Date of Birth: December 16, 1960   Medicare Important Message Given:  Yes    Kerin Salen 09/18/2017, 10:09 AMImportant Message  Patient Details  Name: [name] MRN: [id number] Date of Birth: Apr 03, 1961   Medicare Important Message Given:  Yes    Kerin Salen 09/18/2017, 10:09 AM

## 2017-09-18 NOTE — Anesthesia Preprocedure Evaluation (Addendum)
Anesthesia Evaluation  Patient identified by MRN, date of birth, ID band Patient awake    Reviewed: Allergy & Precautions, NPO status , Patient's Chart, lab work & pertinent test results  Airway Mallampati: II  TM Distance: >3 FB     Dental  (+) Dental Advisory Given, Chipped, Missing, Edentulous Upper   Pulmonary former smoker,    breath sounds clear to auscultation       Cardiovascular hypertension, Pt. on medications + Peripheral Vascular Disease   Rhythm:Regular Rate:Normal     Neuro/Psych  Neuromuscular disease    GI/Hepatic Neg liver ROS, GERD  ,  Endo/Other  diabetes, Type 2, Oral Hypoglycemic Agents  Renal/GU Renal disease     Musculoskeletal   Abdominal   Peds  Hematology   Anesthesia Other Findings   Reproductive/Obstetrics                            Anesthesia Physical  Anesthesia Plan  ASA: III  Anesthesia Plan: General   Post-op Pain Management:    Induction: Intravenous  PONV Risk Score and Plan: 3 and Ondansetron, Dexamethasone and Midazolam  Airway Management Planned: LMA  Additional Equipment:   Intra-op Plan:   Post-operative Plan: Extubation in OR  Informed Consent: I have reviewed the patients History and Physical, chart, labs and discussed the procedure including the risks, benefits and alternatives for the proposed anesthesia with the patient or authorized representative who has indicated his/her understanding and acceptance.   Dental advisory given  Plan Discussed with: CRNA and Anesthesiologist  Anesthesia Plan Comments:         Anesthesia Quick Evaluation

## 2017-09-18 NOTE — Anesthesia Procedure Notes (Signed)
Procedure Name: LMA Insertion Date/Time: 09/18/2017 12:29 PM Performed by: Cynda Familia, CRNA Pre-anesthesia Checklist: Patient identified, Emergency Drugs available, Suction available and Patient being monitored Patient Re-evaluated:Patient Re-evaluated prior to induction Oxygen Delivery Method: Circle System Utilized Preoxygenation: Pre-oxygenation with 100% oxygen Induction Type: IV induction Ventilation: Mask ventilation without difficulty LMA: LMA inserted LMA Size: 4.0 Number of attempts: 1 Placement Confirmation: positive ETCO2 Tube secured with: Tape Dental Injury: Teeth and Oropharynx as per pre-operative assessment  Comments: Smooth IV induction Miller -- LMA AM CRNA atraumatic-- teeth and mouth as preop-- many missing teeth-- bilat BS Sabra Heck

## 2017-09-18 NOTE — Progress Notes (Signed)
If pt does well overnight, I'm fine with letting her go back to SNF on Tues 4/9.

## 2017-09-18 NOTE — Transfer of Care (Signed)
Immediate Anesthesia Transfer of Care Note  Patient: [name]  Procedure(s) Performed: CYSTOSCOPY/LEFTURETEROSCOPY/URETERAL STENT PLACEMENT STONE EXTRACTION (Left Ureter) CYSTOSCOPY WITH RETROGRADE PYELOGRAM (Bilateral )  Patient Location: PACU  Anesthesia Type:General  Level of Consciousness: awake and alert   Airway & Oxygen Therapy: Patient Spontanous Breathing and Patient connected to face mask oxygen  Post-op Assessment: Report given to RN and Post -op Vital signs reviewed and stable  Post vital signs: Reviewed and stable  Last Vitals:  Vitals Value Taken Time  BP 118/62 09/18/2017  1:23 PM  Temp    Pulse 75 09/18/2017  1:25 PM  Resp 16 09/18/2017  1:25 PM  SpO2 100 % 09/18/2017  1:25 PM  Vitals shown include unvalidated device data.  Last Pain:  Vitals:   09/18/17 1138  TempSrc:   PainSc: 0-No pain      Patients Stated Pain Goal: 2 (36/46/80 3212)  Complications: No apparent anesthesia complications

## 2017-09-18 NOTE — Progress Notes (Signed)
[facility] PROGRESS NOTE  LAKETIA VICKNAIR BMW:[id number] DOB: [birth year] DOA: 09/11/2017  PCP: Patient, [name]  Brief History/Interval Summary: This is a 57 year old female with a complex history including significant nephrolithiasis requiring PCNL, MS, neurogenic bladder requiring suprapubic catheter, and multiple other medical comorbidities who noted with suprapubic pain.  She was found to have bilateral hydroureteronephrosis.  She was found to have ureteral stones.  Urology was consulted.  She was admitted for presumed urinary tract infection in this complicated setting.  Patient also has a previous history of nephrostomy tubes and bilateral percutaneous nephro lithotomy.  Urology plans to perform left ureteroscopy and removal of the stones on Monday 4/8.  Reason for Visit: Complicated UTI.  Consultants: Urology  Procedures: None  Antibiotics: Meropenem 4/1  Subjective/Interval History: Patient asking why she is getting insulin in the hospital.  This was explained to her.  Still experiencing leakage around the suprapubic catheter site.  Waiting to undergo surgery today.     ROS: No chest pain or shortness of breath.  Objective:  Vital Signs  Vitals:   09/17/17 1209 09/17/17 1347 09/17/17 2127 09/18/17 0659  BP: 117/73 114/61 131/73 122/75  Pulse: 91 93 96 73  Resp: 16 17 17 17   Temp: 97.8 F (36.6 C) 98.1 F (36.7 C) 98.3 F (36.8 C) 97.9 F (36.6 C)  TempSrc: Oral Oral Oral Oral  SpO2: 100% 100% 97% 98%  Weight:      Height:        Intake/Output Summary (Last 24 hours) at 09/18/2017 0932 Last data filed at 09/17/2017 1035 Gross per 24 hour  Intake 120 ml  Output -  Net 120 ml   Filed Weights   09/11/17 2134 09/12/17 0150 09/16/17 1400  Weight: 64.9 kg (143 lb) 63.8 kg (140 lb 9.8 oz) 63.5 kg (140 lb)    General appearance: Awake alert.  In no distress Resp: Clear to auscultation bilaterally Cardio: S1-S2 is normal regular.  No S3-S4.  No rubs murmurs  of bruit GI: Remains tender in the suprapubic area.  No rebound rigidity or guarding.  Bowel sounds present. Extremities: Chronic skin changes and edema in lower extremities, this is stable Skin: Improved erythema in the neck area Pulses: 2+ and symmetric Neurologic: No focal deficits  Lab Results:  Data Reviewed: I have personally reviewed following labs and imaging studies  CBC: Recent Labs  Lab 09/13/17 0615 09/14/17 0654 09/15/17 0500 09/16/17 0559 09/18/17 0508  WBC 10.5 9.0 11.4* 9.7 12.0*  HGB 8.2* 9.3* 9.5* 8.6* 8.6*  HCT 26.7* 29.8* 31.0* 27.4* 27.4*  MCV 93.4 90.9 93.1 93.2 92.3  PLT 451* 381 480* 454* 467*    Basic Metabolic Panel: Recent Labs  Lab 09/13/17 0615 09/14/17 0654 09/15/17 0500 09/16/17 0559 09/18/17 0508  NA 143 139 138 143 138  K 4.2 4.0 4.1 4.1 4.1  CL 111 110 107 110 108  CO2 24 24 23 26 22   GLUCOSE 140* 139* 132* 128* 154*  BUN 16 14 13 15 18   CREATININE 0.77 0.67 0.67 0.62 0.60  CALCIUM 10.3 10.0 10.2 10.1 10.3    GFR: Estimated Creatinine Clearance: 70.4 mL/min (by C-G formula based on SCr of 0.6 mg/dL).  Liver Function Tests: Recent Labs  Lab 09/11/17 2027  AST 16  ALT 15  ALKPHOS 74  BILITOT 0.4  PROT 8.5*  ALBUMIN 3.6    Recent Labs  Lab 09/11/17 2027  LIPASE 43   CBG: Recent Labs  Lab 09/17/17 0748  09/17/17 1214 09/17/17 1632 09/17/17 2124 09/18/17 0730  GLUCAP 103* 181* 178* 165* 155*     Recent Results (from the past 240 hour(s))  Culture, Urine     Status: Abnormal   Collection Time: 09/11/17 10:35 PM  Result Value Ref Range Status   Specimen Description   Final    URINE, RANDOM Performed at [facility] [address], [place], [place] [address]    Special Requests   Final    NONE Performed at [facility], [place] [address], [place], [place] [address]    Culture MULTIPLE SPECIES PRESENT, SUGGEST RECOLLECTION (A)  Final   Report Status 09/13/2017 FINAL   Final  MRSA PCR Screening     Status: None   Collection Time: 09/12/17  3:16 AM  Result Value Ref Range Status   MRSA by PCR NEGATIVE NEGATIVE Final    Comment:        The GeneXpert MRSA Assay (FDA approved for NASAL specimens only), is one component of a comprehensive MRSA colonization surveillance program. It is not intended to diagnose MRSA infection nor to guide or monitor treatment for MRSA infections. Performed at [facility], [place] [address] [place], [place] [address]   Culture, Urine     Status: Abnormal   Collection Time: 09/13/17 10:12 PM  Result Value Ref Range Status   Specimen Description   Final    URINE, RANDOM Performed at [facility] [address] [place], [place] [address]    Special Requests   Final    NONE Performed at [facility], [place] [address], [place], [place] [address]    Culture MULTIPLE SPECIES PRESENT, SUGGEST RECOLLECTION (A)  Final   Report Status 09/15/2017 FINAL  Final      Radiology Studies: No results found.   Medications:  Scheduled: . atorvastatin  10 mg Oral QHS  . enoxaparin (LOVENOX) injection  40 mg Subcutaneous Q24H  . famotidine  20 mg Oral BID  . ferrous sulfate  325 mg Oral BID  . fluconazole  100 mg Oral Daily  . gabapentin  100 mg Oral QHS  . insulin aspart  0-15 Units Subcutaneous TID WC  . lisinopril  2.5 mg Oral Daily  . magnesium oxide  400 mg Oral Daily  . multivitamin with minerals  1 tablet Oral Daily  . neomycin-bacitracin-polymyxin   Topical BID  . polyethylene glycol  17 g Oral BID  . potassium chloride  10 mEq Oral Daily   Continuous: . meropenem (MERREM) IV Stopped (09/18/17 0903)   XFG:[id number] **OR** acetaminophen, albuterol, ondansetron **OR** ondansetron (ZOFRAN) IV  Assessment/Plan:  Principal Problem:   Pyelonephritis Active Problems:   Multiple sclerosis diagnosed 2002-not on Therapy any longer   GERD without esophagitis    Chronic indwelling Foley catheter   Neurogenic bladder   Controlled diabetes mellitus type 2 with complications ([place])   Left nephrolithiasis   Neuropathic pain   Essential hypertension   Renal calculus   Bilateral hydronephrosis   Acute lower UTI    Acute pyelonephritis/catheter associated UTI Patient with previous history of ESBL infection.  Patient was empirically placed on meropenem on 4/1 which is being continued.  Urine cultures in the hospital unfortunately have grown multiple species without any specific organism.  Urology recommended 1 week of IV antibiotic and then subsequently to undergo urological procedure.  Postprocedure patient will need 10-14-day course of antibiotic.  Meropenem was initiated on 4/1.  Plan is for  surgery today.  Will discuss with urology regarding continuation of antibiotics in view of negative urine cultures during this hospitalization and previous history of ESBL infection.    Bilateral hydronephrosis/nephrolithiasis/suprapubic catheter leakage Management per urology.  Surgical procedure planned for Monday.  History of multiple sclerosis with neurogenic bladder She is nonambulatory.  Not on treatment at this time.  Diabetes mellitus type 2 HbA1c 7.4.  CBGs stable.  Continue sliding scale coverage.  Continue to hold oral hypoglycemic agents.  Noted to be on glipizide and metformin at home.  History of essential hypertension Continue with lisinopril.  Blood pressure is very well controlled.  Normocytic anemia Drop in hemoglobin is likely dilutional.  No overt bleeding is noted.  Continue to monitor.  Folliculitis Seems to be improving.  Erythema was noted around her neck area.  Pustular lesion appears to have improved.  Continue antibacterial cream.  DVT Prophylaxis: Lovenox    Code Status: Full code Family Communication: Discussed with the patient Disposition Plan: Plan is for surgery today.  She is here from a skilled nursing facility and will  most likely return there when ready for discharge.   LOS: 6 days   [facility] Pager [phone] 09/18/2017, 9:32 AM  If 7PM-7AM, please contact night-coverage at [email or web address], password [facility]

## 2017-09-19 LAB — CBC
HCT: 25.8 % — ABNORMAL LOW (ref 36.0–46.0)
Hemoglobin: 8.2 g/dL — ABNORMAL LOW (ref 12.0–15.0)
MCH: 29 pg (ref 26.0–34.0)
MCHC: 31.8 g/dL (ref 30.0–36.0)
MCV: 91.2 fL (ref 78.0–100.0)
PLATELETS: 412 10*3/uL — AB (ref 150–400)
RBC: 2.83 MIL/uL — ABNORMAL LOW (ref 3.87–5.11)
RDW: 17.4 % — AB (ref 11.5–15.5)
WBC: 10 10*3/uL (ref 4.0–10.5)

## 2017-09-19 LAB — GLUCOSE, CAPILLARY
Glucose-Capillary: 154 mg/dL — ABNORMAL HIGH (ref 65–99)
Glucose-Capillary: 185 mg/dL — ABNORMAL HIGH (ref 65–99)

## 2017-09-19 LAB — BASIC METABOLIC PANEL
ANION GAP: 8 (ref 5–15)
BUN: 15 mg/dL (ref 6–20)
CALCIUM: 10.1 mg/dL (ref 8.9–10.3)
CO2: 23 mmol/L (ref 22–32)
Chloride: 109 mmol/L (ref 101–111)
Creatinine, Ser: 0.52 mg/dL (ref 0.44–1.00)
GLUCOSE: 167 mg/dL — AB (ref 65–99)
Potassium: 4.3 mmol/L (ref 3.5–5.1)
Sodium: 140 mmol/L (ref 135–145)

## 2017-09-19 MED ORDER — FLUCONAZOLE 100 MG PO TABS: 100.0000 mg | ORAL_TABLET | Freq: Every day | ORAL | 0 refills | Status: AC

## 2017-09-19 MED ORDER — NITROFURANTOIN MONOHYD MACRO 100 MG PO CAPS: 100.0000 mg | ORAL_CAPSULE | Freq: Two times a day (BID) | ORAL | 0 refills | Status: AC

## 2017-09-19 MED ORDER — BACITRACIN-NEOMYCIN-POLYMYXIN OINTMENT TUBE: 1.0000 "application " | TOPICAL_OINTMENT | Freq: Two times a day (BID) | CUTANEOUS | 0 refills | Status: AC

## 2017-09-19 MED ORDER — NITROFURANTOIN MONOHYD MACRO 100 MG PO CAPS
100.0000 mg | ORAL_CAPSULE | Freq: Two times a day (BID) | ORAL | Status: DC
  Administered 2017-09-19: 100 mg via ORAL
  Filled 2017-09-19: qty 1

## 2017-09-19 NOTE — Progress Notes (Signed)
Attempted to call report [phone] with no success.

## 2017-09-19 NOTE — Discharge Summary (Signed)
[facility]  Physician Discharge Summary   [name] ID: [name] MRN: [id number] DOB/AGE: [name] 57 y.o.  Admit date: 09/11/2017 Discharge date: 09/19/2017  PCP: [name], [name]  DISCHARGE DIAGNOSES:  Principal Problem:   Pyelonephritis Active Problems:   Multiple sclerosis diagnosed 2002-not on Therapy any longer   GERD without esophagitis   Chronic indwelling Foley catheter   Neurogenic bladder   Controlled diabetes mellitus type 2 with complications ([place])   Left nephrolithiasis   Neuropathic pain   Essential hypertension   Renal calculus   Bilateral hydronephrosis   Acute lower UTI   RECOMMENDATIONS FOR OUTPATIENT FOLLOW UP: 1. [name] will need to be on nitrofurantoin until she has followed up with urology. 2. Check labs including CBC and basic metabolic panel in 1 week  DISCHARGE CONDITION: fair  Diet recommendation: Modified carbohydrate  Filed Weights   09/12/17 0150 09/16/17 1400 09/18/17 1138  Weight: 63.8 kg (140 lb 9.8 oz) 63.5 kg (140 lb) 65.8 kg (145 lb)    INITIAL HISTORY: This is a 57 year old female with a complex history including significant nephrolithiasis requiring PCNL, MS, neurogenic bladder requiring suprapubic catheter, and multiple other medical comorbidities who noted with suprapubic pain.  She was found to have bilateral hydroureteronephrosis.  She was found to have ureteral stones.  Urology was consulted.  She was admitted for presumed urinary tract infection in this complicated setting.  [name] also has a previous history of nephrostomy tubes and bilateral percutaneous nephro lithotomy.    Consultants: Urology  Procedures:  Cystoscopy, bilateral retrograde ureteral pyelograms, fluoroscopic interpretation, left ureteroscopy, extraction of left ureteral and renal calculi, placement of 6 French by 24 cm contour double-J stent without tether   HOSPITAL COURSE:   Acute pyelonephritis/catheter associated UTI [name] with  previous history of ESBL infection.  [name] was empirically placed on meropenem on 4/1.  Urine cultures in the hospital have grown multiple species without any specific organism.    [name] was maintained on intravenous meropenem until she underwent her urological procedure. She has completed 8 days of meropenem.  Discussed with Dr. Diona Fanti yesterday.  He recommends continuing the [name] on nitrofurantoin until he has seen the [name] in follow-up and has removed the stent.    Bilateral hydronephrosis/nephrolithiasis/suprapubic catheter leakage [name] underwent cystoscopy, retrograde pyelograms, left ureteroscopy and extraction of the stones and placement of double J stent.  Seems to be stable this morning.  History of multiple sclerosis with neurogenic bladder She is nonambulatory at baseline.  Not on treatment at this time.  Diabetes mellitus type 2 HbA1c 7.4.  CBGs stable.    She may resume her oral medications at discharge.Marland Kitchen  History of essential hypertension Continue home medications.  Normocytic anemia Drop in hemoglobin is likely dilutional.  No overt bleeding is noted.    Folliculitis Seems to be improving.  Erythema was noted around her neck area.  Pustular lesion appears to have improved.  Continue antibacterial cream.  Overall stable.  Okay for discharge home today.   PERTINENT LABS:  The results of significant diagnostics from this hospitalization (including imaging, microbiology, ancillary and laboratory) are listed below for reference.    Microbiology: Recent Results (from the past 240 hour(s))  Culture, Urine     Status: Abnormal   Collection Time: 09/11/17 10:35 PM  Result Value Ref Range Status   Specimen Description   Final    URINE, RANDOM Performed at [facility] [address], [place], [place] [address]    Special  Requests   Final    NONE Performed at [facility], [place] [address], [place], [place]  [address]    Culture MULTIPLE SPECIES PRESENT, SUGGEST RECOLLECTION (A)  Final   Report Status 09/13/2017 FINAL  Final  MRSA PCR Screening     Status: None   Collection Time: 09/12/17  3:16 AM  Result Value Ref Range Status   MRSA by PCR NEGATIVE NEGATIVE Final    Comment:        The GeneXpert MRSA Assay (FDA approved for NASAL specimens only), is one component of a comprehensive MRSA colonization surveillance program. It is not intended to diagnose MRSA infection nor to guide or monitor treatment for MRSA infections. Performed at [facility], [place] [address], [place], [place] [address]   Culture, Urine     Status: Abnormal   Collection Time: 09/13/17 10:12 PM  Result Value Ref Range Status   Specimen Description   Final    URINE, RANDOM Performed at [facility] [address] [place], [place] [address]    Special Requests   Final    NONE Performed at [facility], [place] [address], [place], [place] [address]    Culture MULTIPLE SPECIES PRESENT, SUGGEST RECOLLECTION (A)  Final   Report Status 09/15/2017 FINAL  Final     Labs: Basic Metabolic Panel: Recent Labs  Lab 09/14/17 0654 09/15/17 0500 09/16/17 0559 09/18/17 0508 09/19/17 0609  NA 139 138 143 138 140  K 4.0 4.1 4.1 4.1 4.3  CL 110 107 110 108 109  CO2 24 23 26 22 23   GLUCOSE 139* 132* 128* 154* 167*  BUN 14 13 15 18 15   CREATININE 0.67 0.67 0.62 0.60 0.52  CALCIUM 10.0 10.2 10.1 10.3 10.1   CBC: Recent Labs  Lab 09/14/17 0654 09/15/17 0500 09/16/17 0559 09/18/17 0508 09/19/17 0609  WBC 9.0 11.4* 9.7 12.0* 10.0  HGB 9.3* 9.5* 8.6* 8.6* 8.2*  HCT 29.8* 31.0* 27.4* 27.4* 25.8*  MCV 90.9 93.1 93.2 92.3 91.2  PLT 381 480* 454* 467* 412*    CBG: Recent Labs  Lab 09/18/17 1150 09/18/17 1327 09/18/17 1625 09/18/17 2030 09/19/17 0736  GLUCAP 131* 136* 192* 311* 154*     IMAGING STUDIES Ct Abdomen Pelvis W Contrast  Result Date:  09/11/2017 CLINICAL DATA:  Abdominal pain at catheter site. History of multiple sclerosis, decubitus ulcers, stroke. EXAM: CT ABDOMEN AND PELVIS WITH CONTRAST TECHNIQUE: Multidetector CT imaging of the abdomen and pelvis was performed using the standard protocol following bolus administration of intravenous contrast. CONTRAST:  168mL ISOVUE-300 IOPAMIDOL (ISOVUE-300) INJECTION 61% COMPARISON:  CT abdomen and pelvis July 26, 2017 FINDINGS: LOWER CHEST: Dependent atelectasis. Included heart size is normal. No pericardial effusion. HEPATOBILIARY: Liver and gallbladder are normal. PANCREAS: Normal. SPLEEN: Normal. ADRENALS/URINARY TRACT: Kidneys orthotopic. Striated enhancement RIGHT kidney with moderate hydronephrosis and urothelial enhancement. Interval removal of nephrostomy tube with scarring. Moderate LEFT hydronephrosis with multiple parapelvic cyst and urothelial enhancement. Multiple LEFT nephrolithiasis measuring to 3 mm. LEFT greater than RIGHT periureteral fat stranding. 4 mm distal LEFT ureteral calculus; dense, likely debris distended LEFT ureter. Interval LEFT nephrostomy tube. Urinary bladder decompressed with indwelling suprapubic catheter. No inflammatory changes or focal fluid collection about the catheter tract. STOMACH/BOWEL: The stomach, small and large bowel are normal in course and caliber without inflammatory changes. Normal appendix. VASCULAR/LYMPHATIC: Aortoiliac vessels are normal in course and caliber. Mild intimal thickening calcific atherosclerosis. No lymphadenopathy by CT size criteria. REPRODUCTIVE:  Lobulated heterogeneously enhancing uterus most compatible with leiomyomas. OTHER: No intraperitoneal free fluid or free air. MUSCULOSKELETAL: Nonacute. Anterior abdominal wall ligamentous laxity. Osteopenia. Generalized muscle atrophy. Scarring extending to ischial tuberosities compatible with old decubitus ulcers without focal fluid collection. IMPRESSION: 1. 4 mm distal LEFT ureteral  calculus. Multiple LEFT nephrolithiasis measuring to 3 mm. 2. Bilateral pyelitis and RIGHT pyelonephritis. 3. Moderate bilateral hydronephrosis, status post interval removal of nephrostomy tubes. Aortic Atherosclerosis (ICD10-I70.0). Electronically Signed   By: Elon Alas M.D.   On: 09/11/2017 22:32   Dg C-arm 1-60 Min-no Report  Result Date: 09/18/2017 Fluoroscopy was utilized by the requesting physician.  No radiographic interpretation.    DISCHARGE EXAMINATION: Vitals:   09/18/17 1817 09/18/17 1852 09/18/17 2004 09/19/17 0549  BP: 114/66  116/67 109/60  Pulse: (!) 102 91 90 75  Resp: 20  18 18   Temp: 98.4 F (36.9 C)  98.4 F (36.9 C) 98.1 F (36.7 C)  TempSrc: Oral  Oral Oral  SpO2: (!) 86% 100% 99% 97%  Weight:      Height:       General appearance: alert, cooperative, appears stated age and no distress Resp: clear to auscultation bilaterally Cardio: regular rate and rhythm, S1, S2 normal, no murmur, click, rub or gallop GI: soft, only tender in the suprapubic area.  Suprapubic catheter is noted.  No further leakage after she underwent her procedure.  DISPOSITION: Back to her skilled nursing facility  Discharge Instructions    Call MD for:  difficulty breathing, headache or visual disturbances   Complete by:  As directed    Call MD for:  extreme fatigue   Complete by:  As directed    Call MD for:  persistant dizziness or light-headedness   Complete by:  As directed    Call MD for:  persistant nausea and vomiting   Complete by:  As directed    Call MD for:  severe uncontrolled pain   Complete by:  As directed    Call MD for:  temperature >100.4   Complete by:  As directed    Discharge instructions   Complete by:  As directed    [name] will need to be on nitrofurantoin until she has followed up with urology.  Please review other instructions on the discharge summary.  You were cared for by a hospitalist during your hospital stay. If you have any questions about  your discharge medications or the care you received while you were in the hospital after you are discharged, you can call the unit and asked to speak with the hospitalist on call if the hospitalist that took care of you is not available. Once you are discharged, your primary care physician will handle any further medical issues. Please note that NO REFILLS for any discharge medications will be authorized once you are discharged, as it is imperative that you return to your primary care physician (or establish a relationship with a primary care physician if you do not have one) for your aftercare needs so that they can reassess your need for medications and monitor your lab values. If you do not have a primary care physician, you can call [phone] for a physician referral.   Increase activity slowly   Complete by:  As directed         Allergies as of 09/19/2017   No Known Allergies     Medication List    TAKE these medications   acetaminophen 325 MG tablet Commonly known as:  TYLENOL Take 2 tablets (650 mg total) by mouth every 6 (six) hours as needed for fever.   albuterol (2.5 MG/3ML) 0.083% nebulizer solution Commonly known as:  PROVENTIL Take 3 mLs (2.5 mg total) by nebulization every 6 (six) hours as needed for wheezing or shortness of breath.   atorvastatin 10 MG tablet Commonly known as:  LIPITOR Take 10 mg by mouth at bedtime.   famotidine 20 MG tablet Commonly known as:  PEPCID Take 1 tablet (20 mg total) by mouth 2 (two) times daily.   ferrous sulfate 325 (65 FE) MG EC tablet Take 325 mg by mouth 2 (two) times daily.   fluconazole 100 MG tablet Commonly known as:  DIFLUCAN Take 1 tablet (100 mg total) by mouth daily for 7 days.   gabapentin 100 MG capsule Commonly known as:  NEURONTIN Take 1 capsule (100 mg total) by mouth at bedtime.   glipiZIDE 2.5 mg Tabs tablet Commonly known as:  GLUCOTROL Take 2.5 mg by mouth daily before breakfast.   lisinopril 2.5 MG  tablet Commonly known as:  PRINIVIL,ZESTRIL Take 2.5 mg by mouth daily.   Magnesium 400 MG Tabs Take 400 mg by mouth daily.   metFORMIN 500 MG tablet Commonly known as:  GLUCOPHAGE Take 0.5 tablets (250 mg total) by mouth 2 (two) times daily. What changed:  how much to take   multivitamin with minerals Tabs tablet Take 1 tablet by mouth daily.   neomycin-bacitracin-polymyxin Oint Commonly known as:  NEOSPORIN Apply 1 application topically 2 (two) times daily. To neck area and upper chest   nitrofurantoin (macrocrystal-monohydrate) 100 MG capsule Commonly known as:  MACROBID Take 1 capsule (100 mg total) by mouth every 12 (twelve) hours for 14 days.   oxybutynin 5 MG tablet Commonly known as:  DITROPAN Take 5 mg by mouth 2 (two) times daily as needed for bladder spasms.   polyethylene glycol packet Commonly known as:  MIRALAX / GLYCOLAX Take 17 g by mouth 2 (two) times daily.   potassium chloride 10 MEQ tablet Commonly known as:  K-DUR,KLOR-CON Take 1 tablet (10 mEq total) by mouth daily.         Contact information for follow-up providers    Franchot Gallo, MD Follow up.   Specialty:  Urology Why:  his office will schedule follow-up. Contact information: [address] [place] [address] [phone]            Contact information for after-discharge care    Destination    [name] .   Service:  Skilled Nursing Contact information: [address] [place] [phone]                  TOTAL DISCHARGE TIME: 34 minutes  Bonnielee Haff  [facility] Pager [phone]  09/19/2017, 10:36 AM

## 2017-09-19 NOTE — Discharge Instructions (Signed)

## 2017-09-19 NOTE — Progress Notes (Signed)
LCSW following for facility placement.  Patient from [facility].  Patient will return to [facility].   LCSW faxed dc docs to facility.   Patient will transport by [facility].  RN report # [phone]   BKJ

## 2017-10-09 ENCOUNTER — Ambulatory Visit: Payer: Medicare Other | Admitting: Podiatry

## 2017-11-23 ENCOUNTER — Emergency Department (HOSPITAL_COMMUNITY): Payer: Medicare Other

## 2017-11-23 ENCOUNTER — Inpatient Hospital Stay (HOSPITAL_COMMUNITY)
Admission: EM | Admit: 2017-11-23 | Discharge: 2017-11-27 | DRG: 872 | Disposition: A | Discharge status: Skilled Nursing Facility | Payer: Medicare Other | Attending: Internal Medicine | Admitting: Internal Medicine

## 2017-11-23 ENCOUNTER — Other Ambulatory Visit: Payer: Self-pay

## 2017-11-23 ENCOUNTER — Encounter (HOSPITAL_COMMUNITY): Payer: Self-pay

## 2017-11-23 DIAGNOSIS — D631 Anemia in chronic kidney disease: Secondary | ICD-10-CM | POA: Diagnosis present

## 2017-11-23 DIAGNOSIS — N12 Tubulo-interstitial nephritis, not specified as acute or chronic: Secondary | ICD-10-CM | POA: Diagnosis present

## 2017-11-23 DIAGNOSIS — E785 Hyperlipidemia, unspecified: Secondary | ICD-10-CM | POA: Diagnosis present

## 2017-11-23 DIAGNOSIS — Z978 Presence of other specified devices: Secondary | ICD-10-CM

## 2017-11-23 DIAGNOSIS — Z960 Presence of urogenital implants: Secondary | ICD-10-CM

## 2017-11-23 DIAGNOSIS — Z87442 Personal history of urinary calculi: Secondary | ICD-10-CM

## 2017-11-23 DIAGNOSIS — K219 Gastro-esophageal reflux disease without esophagitis: Secondary | ICD-10-CM | POA: Diagnosis present

## 2017-11-23 DIAGNOSIS — N183 Chronic kidney disease, stage 3 (moderate): Secondary | ICD-10-CM | POA: Diagnosis present

## 2017-11-23 DIAGNOSIS — I129 Hypertensive chronic kidney disease with stage 1 through stage 4 chronic kidney disease, or unspecified chronic kidney disease: Secondary | ICD-10-CM | POA: Diagnosis present

## 2017-11-23 DIAGNOSIS — N136 Pyonephrosis: Secondary | ICD-10-CM | POA: Diagnosis present

## 2017-11-23 DIAGNOSIS — M24541 Contracture, right hand: Secondary | ICD-10-CM | POA: Diagnosis present

## 2017-11-23 DIAGNOSIS — N179 Acute kidney failure, unspecified: Secondary | ICD-10-CM | POA: Diagnosis present

## 2017-11-23 DIAGNOSIS — N319 Neuromuscular dysfunction of bladder, unspecified: Secondary | ICD-10-CM | POA: Diagnosis present

## 2017-11-23 DIAGNOSIS — Z86718 Personal history of other venous thrombosis and embolism: Secondary | ICD-10-CM

## 2017-11-23 DIAGNOSIS — E1140 Type 2 diabetes mellitus with diabetic neuropathy, unspecified: Secondary | ICD-10-CM | POA: Diagnosis present

## 2017-11-23 DIAGNOSIS — Z820 Family history of epilepsy and other diseases of the nervous system: Secondary | ICD-10-CM

## 2017-11-23 DIAGNOSIS — Z87891 Personal history of nicotine dependence: Secondary | ICD-10-CM

## 2017-11-23 DIAGNOSIS — Z7401 Bed confinement status: Secondary | ICD-10-CM

## 2017-11-23 DIAGNOSIS — Z7984 Long term (current) use of oral hypoglycemic drugs: Secondary | ICD-10-CM

## 2017-11-23 DIAGNOSIS — A419 Sepsis, unspecified organism: Secondary | ICD-10-CM | POA: Diagnosis not present

## 2017-11-23 DIAGNOSIS — N10 Acute tubulo-interstitial nephritis: Secondary | ICD-10-CM | POA: Diagnosis present

## 2017-11-23 DIAGNOSIS — N1330 Unspecified hydronephrosis: Secondary | ICD-10-CM

## 2017-11-23 DIAGNOSIS — I10 Essential (primary) hypertension: Secondary | ICD-10-CM | POA: Diagnosis present

## 2017-11-23 DIAGNOSIS — Z9359 Other cystostomy status: Secondary | ICD-10-CM

## 2017-11-23 DIAGNOSIS — Z833 Family history of diabetes mellitus: Secondary | ICD-10-CM

## 2017-11-23 DIAGNOSIS — E1122 Type 2 diabetes mellitus with diabetic chronic kidney disease: Secondary | ICD-10-CM | POA: Diagnosis present

## 2017-11-23 DIAGNOSIS — G35 Multiple sclerosis: Secondary | ICD-10-CM | POA: Diagnosis present

## 2017-11-23 LAB — URINALYSIS, ROUTINE W REFLEX MICROSCOPIC
BILIRUBIN URINE: NEGATIVE
Glucose, UA: NEGATIVE mg/dL
KETONES UR: NEGATIVE mg/dL
NITRITE: NEGATIVE
PROTEIN: 100 mg/dL — AB
RBC / HPF: >50 RBC/hpf — ABNORMAL HIGH (ref 0–5)
Specific Gravity, Urine: 1.013 (ref 1.005–1.030)
pH: 7 (ref 5.0–8.0)

## 2017-11-23 LAB — COMPREHENSIVE METABOLIC PANEL
ALT: 17 U/L (ref 14–54)
ANION GAP: 10 (ref 5–15)
AST: 20 U/L (ref 15–41)
Albumin: 3.7 g/dL (ref 3.5–5.0)
Alkaline Phosphatase: 76 U/L (ref 38–126)
BUN: 24 mg/dL — ABNORMAL HIGH (ref 6–20)
CALCIUM: 10.9 mg/dL — AB (ref 8.9–10.3)
CHLORIDE: 108 mmol/L (ref 101–111)
CO2: 25 mmol/L (ref 22–32)
Creatinine, Ser: 0.95 mg/dL (ref 0.44–1.00)
GFR calc non Af Amer: >60 mL/min (ref >60)
Glucose, Bld: 170 mg/dL — ABNORMAL HIGH (ref 65–99)
Potassium: 4.9 mmol/L (ref 3.5–5.1)
SODIUM: 143 mmol/L (ref 135–145)
Total Bilirubin: 0.7 mg/dL (ref 0.3–1.2)
Total Protein: 9 g/dL — ABNORMAL HIGH (ref 6.5–8.1)

## 2017-11-23 LAB — LIPASE, BLOOD: LIPASE: 33 U/L (ref 11–51)

## 2017-11-23 MED ORDER — FENTANYL CITRATE (PF) 100 MCG/2ML IJ SOLN: 50.0000 ug | Freq: Once | INTRAMUSCULAR | Status: DC

## 2017-11-23 MED ORDER — SODIUM CHLORIDE 0.9 % IV BOLUS
1000.0000 mL | Freq: Once | INTRAVENOUS | Status: AC
  Administered 2017-11-23: 1000 mL via INTRAVENOUS

## 2017-11-23 MED ORDER — SODIUM CHLORIDE 0.9 % IV SOLN
1.0000 g | INTRAVENOUS | Status: AC
  Administered 2017-11-24: 1 g via INTRAVENOUS
  Filled 2017-11-23: qty 1

## 2017-11-23 NOTE — Progress Notes (Signed)
A consult was received from an ED physician for meropenem per pharmacy dosing.  The patient's profile has been reviewed for ht/wt/allergies/indication/available labs.   A one time order has been placed for Meropenem 1 Gm.  Further antibiotics/pharmacy consults should be ordered by admitting physician if indicated.                       Thank you, Dorrene German 11/23/2017  11:30 PM

## 2017-11-23 NOTE — ED Triage Notes (Signed)
Lower abd pain since 1600. Hx of chronic pain/kidney stones. No pain with urination. 130/78 HR88 RR 16

## 2017-11-23 NOTE — ED Notes (Signed)
Attempted [place] IV without success. IV team consult placed

## 2017-11-23 NOTE — ED Provider Notes (Signed)
Emergency Department Provider Note   I have reviewed the triage vital signs and the nursing notes.   HISTORY  Chief Complaint Abdominal Pain   HPI [name] is a 57 y.o. female with PMH of CKD, DM, HTN, HLD, MS with neurogenic bladder and indwelling foley cath s/p recent ureteral stenting 2/2 bilateral hydronephrosis presents to the emergency department for evaluation of right-sided abdominal pain.  She states that her pain has been present for the last 2 weeks but worsened severely today.  She denies any fevers or shaking chills.  No vomiting or diarrhea.  She completed a course of antibiotics after her last hospitalization and April 2019.  Patient states she continues to make urine.  Denies any chest pain or dyspnea.  Past Medical History:  Diagnosis Date  . Acute parametritis and pelvic cellulitis   . Acute renal failure superimposed on stage 3 chronic kidney disease ([place]) 07/21/2015  . Anemia    chronic   . Contracture, right hand   . Diabetes mellitus without complication ([place])   . Diabetic neuropathy ([place])   . DVT (deep venous thrombosis) ([place])   . DVT (deep venous thrombosis) ([place])   . GERD (gastroesophageal reflux disease)   . History of kidney stones   . Hx of sepsis   . Hyperlipidemia   . Hypertension   . Hypokalemia   . Hypomagnesemia   . Left nephrolithiasis   . Leukocytosis   . Lymph edema    chronic   . Malnutrition of moderate degree 07/21/2015  . Multiple sclerosis diagnosed 2002-not on Therapy any longer 06/26/2012  . Muscle weakness (generalized)   . Neuromuscular disorder ([place])   . Neuromuscular dysfunction of bladder   . Polyneuropathy   . Presence of indwelling urinary catheter   . Protein calorie malnutrition ([place])   . Stage 4 decubitus ulcer ([place]) 07/05/2015  . Stroke ([place])   . UTI (lower urinary tract infection)     Patient Active Problem List   Diagnosis Date Noted  . Bilateral hydronephrosis 09/13/2017  . Acute lower UTI 09/13/2017  .  UTI due to extended-spectrum beta lactamase (ESBL) producing Escherichia coli 09/12/2017  . Renal calculus 08/15/2016  . Kidney stone 08/15/2016  . Acute metabolic encephalopathy 01/24/4817  . Essential hypertension 07/19/2016  . Hydronephrosis   . Pelvic abscess in female 03/17/2016  . Septic shock ([place]) 03/08/2016  . Neuropathic pain 01/11/2016  . Left nephrolithiasis 12/22/2015  . Pyelonephritis   . Pressure ulcer   . Controlled diabetes mellitus type 2 with complications ([place])   . AKI (acute kidney injury) ([place]) 11/11/2015  . Neurogenic bladder 11/11/2015  . Physical deconditioning 11/11/2015  . Chronic indwelling Foley catheter 09/30/2015  . Dyslipidemia associated with type 2 diabetes mellitus ([place]) 08/28/2015  . GERD without esophagitis 08/28/2015  . Anemia 07/21/2015  . Malnutrition of moderate degree 07/21/2015  . DVT, lower extremity ([place]) 12/28/2014  . Multiple sclerosis diagnosed 2002-not on Therapy any longer 06/26/2012    Past Surgical History:  Procedure Laterality Date  . COLONOSCOPY WITH PROPOFOL N/A 07/25/2016   Procedure: COLONOSCOPY WITH PROPOFOL;  Surgeon: Laurence Spates, MD;  Location: [facility];  Service: Endoscopy;  Laterality: N/A;  . CYSTOSCOPY W/ RETROGRADES Bilateral 09/18/2017   Procedure: CYSTOSCOPY WITH RETROGRADE PYELOGRAM;  Surgeon: Franchot Gallo, MD;  Location: [facility];  Service: Urology;  Laterality: Bilateral;  . CYSTOSCOPY/URETEROSCOPY/HOLMIUM LASER/STENT PLACEMENT Left 09/18/2017   Procedure: CYSTOSCOPY/LEFTURETEROSCOPY/URETERAL STENT PLACEMENT STONE EXTRACTION;  Surgeon: Franchot Gallo, MD;  Location: [facility];  Service: Urology;  Laterality: Left;  . ESOPHAGOGASTRODUODENOSCOPY Left 01/02/2015   Procedure: ESOPHAGOGASTRODUODENOSCOPY (EGD);  Surgeon: Arta Silence, MD;  Location: [facility];  Service: Endoscopy;  Laterality: Left;  . ESOPHAGOGASTRODUODENOSCOPY (EGD) WITH PROPOFOL N/A 07/25/2016   Procedure: ESOPHAGOGASTRODUODENOSCOPY (EGD)  WITH PROPOFOL;  Surgeon: Laurence Spates, MD;  [facility];  Service: Endoscopy;  Laterality: N/A;  . HERNIA MESH REMOVAL    . HOLMIUM LASER APPLICATION Left 0/02/3234   Procedure: HOLMIUM LASER APPLICATION;  Surgeon: Franchot Gallo, MD;  Location: [facility];  Service: Urology;  Laterality: Left;  . IR CATHETER TUBE CHANGE  10/06/2016  . IR CATHETER TUBE CHANGE  11/17/2016  . IR CATHETER TUBE CHANGE  02/10/2017  . IR GENERIC HISTORICAL  02/23/2016   IR NEPHROSTOMY EXCHANGE LEFT 02/23/2016 Sandi Mariscal, MD [facility]  . IR GENERIC HISTORICAL  04/19/2016   IR NEPHROSTOMY EXCHANGE LEFT 04/19/2016 Arne Cleveland, MD [facility]  . IR GENERIC HISTORICAL  05/13/2016   IR NEPHROSTOMY EXCHANGE LEFT 05/13/2016 Corrie Mckusick, DO [facility]  . IR GENERIC HISTORICAL  05/13/2016   IR NEPHROSTOMY PLACEMENT RIGHT 05/13/2016 Corrie Mckusick, DO [facility]  . IR GENERIC HISTORICAL  07/07/2016   IR NEPHROSTOMY EXCHANGE RIGHT 07/07/2016 Jacqulynn Cadet, MD [facility]  . IR GENERIC HISTORICAL  07/07/2016   IR NEPHROSTOMY EXCHANGE LEFT 07/07/2016 Jacqulynn Cadet, MD [facility]  . IR NEPHROSTOGRAM LEFT THRU EXISTING ACCESS  12/28/2016  . IR NEPHROSTOGRAM RIGHT THRU EXISTING ACCESS  12/28/2016  . IR NEPHROSTOMY EXCHANGE LEFT  10/06/2016  . IR NEPHROSTOMY EXCHANGE LEFT  11/17/2016  . IR NEPHROSTOMY EXCHANGE RIGHT  10/06/2016  . IR NEPHROSTOMY EXCHANGE RIGHT  11/17/2016  . NEPHROLITHOTOMY Left 08/18/2016   Procedure: NEPHROLITHOTOMY LEFT PERCUTANEOUS;  Surgeon: Franchot Gallo, MD;  Location[facility];  Service: Urology;  Laterality: Left;  . NEPHROSTOMY TUBE PLACEMENT ([facility])    . TRANSURETHRAL RESECTION OF BLADDER TUMOR N/A 03/16/2016   Procedure: CYSTOSCOPY BLADDER BIOPSY;  Surgeon: Franchot Gallo, MD;  [facility];  Service: Urology;  Laterality: N/A;  . TUBAL LIGATION    . tubes tided        Allergies Patient has no known allergies.  Family History  Problem Relation Age of Onset  . Diabetes  Mother   . Alzheimer's disease Mother   . Diabetes Father   . Diabetes Sister   . Scoliosis Sister     Social History Social History   Tobacco Use  . Smoking status: Former Smoker    Packs/day: 1.00    Years: 20.00    Pack years: 20.00    Types: Cigarettes    Last attempt to quit: 11/19/2014    Years since quitting: 3.0  . Smokeless tobacco: Never Used  Substance Use Topics  . Alcohol use: Yes    Alcohol/week: 3.0 oz    Types: 5 Glasses of wine per week  . Drug use: No    Review of Systems  Constitutional: No fever/chills Eyes: No visual changes. ENT: No sore throat. Cardiovascular: Denies chest pain. Respiratory: Denies shortness of breath. Gastrointestinal: Right sided abdominal pain.  No nausea, no vomiting.  No diarrhea.  No constipation. Genitourinary: Negative for dysuria. Musculoskeletal: Negative for back pain. Skin: Negative for rash. Neurological: Negative for headaches, focal weakness or numbness.  10-point ROS otherwise negative.  ____________________________________________   PHYSICAL EXAM:  VITAL SIGNS: ED Triage Vitals  Enc Vitals Group     BP 11/23/17 1801 (!) 144/83     Pulse  Rate 11/23/17 1801 89     Resp 11/23/17 1801 14     Temp 11/23/17 1801 97.9 F (36.6 C)     Temp Source 11/23/17 1801 Oral     SpO2 11/23/17 1801 98 %     Weight 11/23/17 1801 140 lb (63.5 kg)     Height 11/23/17 1801 5\' 5"  (1.651 m)     Pain Score 11/23/17 1805 9   Constitutional: Alert and oriented. Well appearing and in no acute distress. Eyes: Conjunctivae are normal.  Head: Atraumatic. Nose: No congestion/rhinnorhea. Mouth/Throat: Mucous membranes are slightly dry.  Neck: No stridor.   Cardiovascular: Normal rate, regular rhythm. Good peripheral circulation. Grossly normal heart sounds.   Respiratory: Normal respiratory effort.  No retractions. Lungs CTAB. Gastrointestinal: Soft with mild right lower quadrant tenderness to palpation. No distention.    Musculoskeletal: No gross deformities of extremities. Skin:  Skin is warm, dry and intact. No rash noted. Psychiatric: Mood and affect are normal. Speech and behavior are normal.  ____________________________________________   LABS (all labs ordered are listed, but only abnormal results are displayed)  Labs Reviewed  COMPREHENSIVE METABOLIC PANEL - Abnormal; Notable for the following components:      Result Value   Glucose, Bld 170 (*)    BUN 24 (*)    Calcium 10.9 (*)    Total Protein 9.0 (*)    All other components within normal limits  CBC WITH DIFFERENTIAL/PLATELET - Abnormal; Notable for the following components:   WBC 12.6 (*)    RBC 3.68 (*)    Hemoglobin 10.9 (*)    HCT 34.4 (*)    RDW 16.6 (*)    Platelets 571 (*)    Neutro Abs 8.2 (*)    All other components within normal limits  URINALYSIS, ROUTINE W REFLEX MICROSCOPIC - Abnormal; Notable for the following components:   Color, Urine AMBER (*)    APPearance TURBID (*)    Hgb urine dipstick MODERATE (*)    Protein, ur 100 (*)    Leukocytes, UA MODERATE (*)    RBC / HPF >50 (*)    WBC, UA >50 (*)    Bacteria, UA MANY (*)    All other components within normal limits  GLUCOSE, CAPILLARY - Abnormal; Notable for the following components:   Glucose-Capillary 251 (*)    All other components within normal limits  MRSA PCR SCREENING  URINE CULTURE  CULTURE, BLOOD (ROUTINE X 2)  CULTURE, BLOOD (ROUTINE X 2)  LIPASE, BLOOD  LACTIC ACID, PLASMA  CBC  BASIC METABOLIC PANEL   ____________________________________________  RADIOLOGY  Ct Renal Stone Study  Result Date: 11/23/2017 CLINICAL DATA:  Flank and lower abdominal pain. Recurrent stone disease suspected. EXAM: CT ABDOMEN AND PELVIS WITHOUT CONTRAST TECHNIQUE: Multidetector CT imaging of the abdomen and pelvis was performed following the standard protocol without IV contrast. COMPARISON:  Most recent CT 09/11/2017 FINDINGS: Lower chest: Mild bibasilar atelectasis  or scarring. No pleural fluid. Hepatobiliary: Few scattered subcentimeter hypodensities in the liver are too small to accurately characterize, likely cysts or hemangiomas. Gallbladder physiologically distended, no calcified stone. No biliary dilatation. Pancreas: No ductal dilatation or inflammation. Spleen: Normal in size without focal abnormality. Adrenals/Urinary Tract: Normal adrenal glands. Moderate right hydroureteronephrosis with ureteral thickening, similar to prior exam. Slight improvement in right periureteric fat stranding. Ureter is dilated to the bladder insertion. No obstructing stone. Punctate nonobstructing calculi in the right mid kidney. Small hyperdensity in the posterior right renal cortex is likely a  hemorrhagic cyst. Left ureteral stent in place. Left hydronephrosis and parapelvic cysts are similar to prior exam, allowing for noncontrast appearance. Proximal pigtail is in the renal pelvis, distal pigtail in the urinary bladder. There is left ureteral thickening and periureteric stranding, which is mildly improved from prior. Small nonobstructing stones in the upper and lower left kidney. No stones or stone fragments along the course of the ureteral stent. Urinary bladder is decompressed around a suprapubic tube. No stranding about the catheter tract. Stomach/Bowel: Stomach is within normal limits. Appendix appears normal. No evidence of bowel wall thickening, distention, or inflammatory changes. Vascular/Lymphatic: Mild aortic atherosclerosis. Multiple prominent retroperitoneal lymph nodes appear similar to prior exam. No progressive adenopathy. Reproductive: Lobular heterogeneous uterus with underlying fibroids. No suspicious adnexal mass. Other: No free air, free fluid, or intra-abdominal fluid collection. Tiny fat containing umbilical hernia. Musculoskeletal: There are no acute or suspicious osseous abnormalities. IMPRESSION: 1. Left ureteral stent in place. Persistent left hydronephrosis  with ureteral thickening and perinephric stranding appears similar to exam last month. Previous stone in the distal ureter is no longer seen, and no stones along the course of the ureteral stent. 2. Moderate right hydronephrosis and ureteral thickening appears similar to prior exam, improvement in periureteric stranding. 3. Bilateral nonobstructing renal calculi. 4. Remainder the exam is unchanged. Aortic Atherosclerosis (ICD10-I70.0). Electronically Signed   By: Jeb Levering M.D.   On: 11/23/2017 21:25    ____________________________________________   PROCEDURES  Procedure(s) performed:   .Critical Care Performed by: Margette Fast, MD Authorized by: Margette Fast, MD   Critical care provider statement:    Critical care time (minutes):  35   Critical care time was exclusive of:  Separately billable procedures and treating other patients and teaching time   Critical care was necessary to treat or prevent imminent or life-threatening deterioration of the following conditions:  Renal failure, dehydration and sepsis   Critical care was time spent personally by me on the following activities:  Blood draw for specimens, development of treatment plan with patient or surrogate, discussions with consultants, evaluation of patient's response to treatment, examination of patient, obtaining history from patient or surrogate, ordering and performing treatments and interventions, ordering and review of laboratory studies, ordering and review of radiographic studies, pulse oximetry, re-evaluation of patient's condition and review of old charts   I assumed direction of critical care for this patient from another provider in my specialty: no       ____________________________________________   INITIAL IMPRESSION / ASSESSMENT AND PLAN / ED COURSE  Pertinent labs & imaging results that were available during my care of the patient were reviewed by me and considered in my medical decision making (see  chart for details).  Patient presents to the emergency department for evaluation of right-sided abdominal pain.  The patient had ureteral stenting with kidney stones April 2019.  Patient was on meropenem in the hospital and went home on Macrobid and reports completing the course.  Patient is showing no signs of sepsis at this time.  She does have right-sided abdominal tenderness as well as CVA tenderness.   10:50 PM Spoke with Dr. Alyson Ingles with Urology regarding the CT. He reviewed the CT and recommends leaving the stents in place. No additional Urology intervention. With continued pyelitis on the left and hydro along with air in the bladder there is suspicion for infection.   Labs reviewed showing leukocytosis and UTI. Starting Meropenem and IVF.   Discussed patient's case with Hospitalist to request  admission. Patient and family (if present) updated with plan. Care transferred to Hospitalist service.  I reviewed all nursing notes, vitals, pertinent old records, EKGs, labs, imaging (as available).  ____________________________________________  FINAL CLINICAL IMPRESSION(S) / ED DIAGNOSES  Final diagnoses:  Pyelitis  Hydronephrosis, unspecified hydronephrosis type     MEDICATIONS GIVEN DURING THIS VISIT:  Medications  fentaNYL (SUBLIMAZE) injection 50 mcg (has no administration in time range)  albuterol (PROVENTIL) (2.5 MG/3ML) 0.083% nebulizer solution 2.5 mg (has no administration in time range)  atorvastatin (LIPITOR) tablet 10 mg (has no administration in time range)  famotidine (PEPCID) tablet 20 mg (20 mg Oral Given 11/24/17 0848)  gabapentin (NEURONTIN) capsule 100 mg (has no administration in time range)  glipiZIDE (GLUCOTROL) tablet 5 mg (5 mg Oral Given 11/24/17 0848)  heparin injection 5,000 Units (5,000 Units Subcutaneous Given 11/24/17 0642)  HYDROcodone-acetaminophen (NORCO/VICODIN) 5-325 MG per tablet 1 tablet (has no administration in time range)  ondansetron (ZOFRAN)  tablet 4 mg (has no administration in time range)    Or  ondansetron (ZOFRAN) injection 4 mg (has no administration in time range)  insulin aspart (novoLOG) injection 0-9 Units (5 Units Subcutaneous Given 11/24/17 0848)  meropenem (MERREM) 1 g in sodium chloride 0.9 % 100 mL IVPB (1 g Intravenous New Bag/Given 11/24/17 0849)  0.45 % sodium chloride infusion ( Intravenous New Bag/Given 11/24/17 0245)  sodium chloride 0.9 % bolus 1,000 mL (0 mLs Intravenous Stopped 11/24/17 0047)  meropenem (MERREM) 1 g in sodium chloride 0.9 % 100 mL IVPB (0 g Intravenous Stopped 11/24/17 0030)    Note:  This document was prepared using Dragon voice recognition software and may include unintentional dictation errors.  Nanda Quinton, MD Emergency Medicine    Long, [facility], MD 11/24/17 [phone]

## 2017-11-24 ENCOUNTER — Other Ambulatory Visit: Payer: Self-pay

## 2017-11-24 ENCOUNTER — Encounter (HOSPITAL_COMMUNITY): Payer: Self-pay | Admitting: *Deleted

## 2017-11-24 DIAGNOSIS — K219 Gastro-esophageal reflux disease without esophagitis: Secondary | ICD-10-CM | POA: Diagnosis present

## 2017-11-24 DIAGNOSIS — G35 Multiple sclerosis: Secondary | ICD-10-CM | POA: Diagnosis present

## 2017-11-24 DIAGNOSIS — Z960 Presence of urogenital implants: Secondary | ICD-10-CM | POA: Diagnosis not present

## 2017-11-24 DIAGNOSIS — Z87442 Personal history of urinary calculi: Secondary | ICD-10-CM | POA: Diagnosis not present

## 2017-11-24 DIAGNOSIS — Z87891 Personal history of nicotine dependence: Secondary | ICD-10-CM | POA: Diagnosis not present

## 2017-11-24 DIAGNOSIS — N179 Acute kidney failure, unspecified: Secondary | ICD-10-CM | POA: Diagnosis present

## 2017-11-24 DIAGNOSIS — N183 Chronic kidney disease, stage 3 (moderate): Secondary | ICD-10-CM | POA: Diagnosis present

## 2017-11-24 DIAGNOSIS — N1330 Unspecified hydronephrosis: Secondary | ICD-10-CM | POA: Diagnosis not present

## 2017-11-24 DIAGNOSIS — D631 Anemia in chronic kidney disease: Secondary | ICD-10-CM | POA: Diagnosis present

## 2017-11-24 DIAGNOSIS — Z9359 Other cystostomy status: Secondary | ICD-10-CM | POA: Diagnosis not present

## 2017-11-24 DIAGNOSIS — I129 Hypertensive chronic kidney disease with stage 1 through stage 4 chronic kidney disease, or unspecified chronic kidney disease: Secondary | ICD-10-CM | POA: Diagnosis present

## 2017-11-24 DIAGNOSIS — E1140 Type 2 diabetes mellitus with diabetic neuropathy, unspecified: Secondary | ICD-10-CM | POA: Diagnosis present

## 2017-11-24 DIAGNOSIS — E1122 Type 2 diabetes mellitus with diabetic chronic kidney disease: Secondary | ICD-10-CM | POA: Diagnosis present

## 2017-11-24 DIAGNOSIS — M24541 Contracture, right hand: Secondary | ICD-10-CM | POA: Diagnosis present

## 2017-11-24 DIAGNOSIS — E785 Hyperlipidemia, unspecified: Secondary | ICD-10-CM | POA: Diagnosis present

## 2017-11-24 DIAGNOSIS — Z86718 Personal history of other venous thrombosis and embolism: Secondary | ICD-10-CM | POA: Diagnosis not present

## 2017-11-24 DIAGNOSIS — Z833 Family history of diabetes mellitus: Secondary | ICD-10-CM | POA: Diagnosis not present

## 2017-11-24 DIAGNOSIS — N136 Pyonephrosis: Secondary | ICD-10-CM | POA: Diagnosis present

## 2017-11-24 DIAGNOSIS — Z7401 Bed confinement status: Secondary | ICD-10-CM | POA: Diagnosis not present

## 2017-11-24 DIAGNOSIS — I10 Essential (primary) hypertension: Secondary | ICD-10-CM | POA: Diagnosis not present

## 2017-11-24 DIAGNOSIS — N319 Neuromuscular dysfunction of bladder, unspecified: Secondary | ICD-10-CM | POA: Diagnosis present

## 2017-11-24 DIAGNOSIS — Z820 Family history of epilepsy and other diseases of the nervous system: Secondary | ICD-10-CM | POA: Diagnosis not present

## 2017-11-24 DIAGNOSIS — Z7984 Long term (current) use of oral hypoglycemic drugs: Secondary | ICD-10-CM | POA: Diagnosis not present

## 2017-11-24 DIAGNOSIS — A419 Sepsis, unspecified organism: Secondary | ICD-10-CM | POA: Diagnosis present

## 2017-11-24 DIAGNOSIS — N12 Tubulo-interstitial nephritis, not specified as acute or chronic: Secondary | ICD-10-CM | POA: Diagnosis not present

## 2017-11-24 LAB — MRSA PCR SCREENING: MRSA by PCR: NEGATIVE

## 2017-11-24 LAB — GLUCOSE, CAPILLARY
GLUCOSE-CAPILLARY: 251 mg/dL — AB (ref 65–99)
GLUCOSE-CAPILLARY: 251 mg/dL — AB (ref 65–99)
GLUCOSE-CAPILLARY: 133 mg/dL — AB (ref 65–99)
Glucose-Capillary: 218 mg/dL — ABNORMAL HIGH (ref 65–99)

## 2017-11-24 LAB — CBC WITH DIFFERENTIAL/PLATELET
BASOS ABS: 0.1 10*3/uL (ref 0.0–0.1)
Basophils Relative: 1 %
EOS PCT: 3 %
Eosinophils Absolute: 0.4 10*3/uL (ref 0.0–0.7)
HEMATOCRIT: 34.4 % — AB (ref 36.0–46.0)
HEMOGLOBIN: 10.9 g/dL — AB (ref 12.0–15.0)
Lymphocytes Relative: 24 %
Lymphs Abs: 3 10*3/uL (ref 0.7–4.0)
MCH: 29.6 pg (ref 26.0–34.0)
MCHC: 31.7 g/dL (ref 30.0–36.0)
MCV: 93.5 fL (ref 78.0–100.0)
MONO ABS: 0.9 10*3/uL (ref 0.1–1.0)
MONOS PCT: 7 %
NEUTROS PCT: 65 %
Neutro Abs: 8.2 10*3/uL — ABNORMAL HIGH (ref 1.7–7.7)
Platelets: 571 10*3/uL — ABNORMAL HIGH (ref 150–400)
RBC: 3.68 MIL/uL — AB (ref 3.87–5.11)
RDW: 16.6 % — AB (ref 11.5–15.5)
WBC: 12.6 10*3/uL — AB (ref 4.0–10.5)

## 2017-11-24 MED ORDER — GABAPENTIN 100 MG PO CAPS
100.0000 mg | ORAL_CAPSULE | Freq: Every day | ORAL | Status: DC
  Administered 2017-11-24 – 2017-11-27 (×4): 100 mg via ORAL
  Filled 2017-11-24 (×4): qty 1

## 2017-11-24 MED ORDER — SODIUM CHLORIDE 0.45 % IV SOLN
INTRAVENOUS | Status: AC
  Administered 2017-11-24: 03:00:00 via INTRAVENOUS

## 2017-11-24 MED ORDER — SODIUM CHLORIDE 0.9 % IV SOLN
1.0000 g | Freq: Three times a day (TID) | INTRAVENOUS | Status: DC
  Administered 2017-11-24 – 2017-11-27 (×9): 1 g via INTRAVENOUS
  Filled 2017-11-24 (×13): qty 1

## 2017-11-24 MED ORDER — SODIUM CHLORIDE 0.9 % IV SOLN
INTRAVENOUS | Status: DC
  Administered 2017-11-24 – 2017-11-25 (×2): via INTRAVENOUS

## 2017-11-24 MED ORDER — ALBUTEROL SULFATE (2.5 MG/3ML) 0.083% IN NEBU: 2.5000 mg | INHALATION_SOLUTION | Freq: Four times a day (QID) | RESPIRATORY_TRACT | Status: DC | PRN

## 2017-11-24 MED ORDER — INSULIN ASPART 100 UNIT/ML ~~LOC~~ SOLN
0.0000 [IU] | Freq: Three times a day (TID) | SUBCUTANEOUS | Status: DC
  Administered 2017-11-24: 5 [IU] via SUBCUTANEOUS
  Administered 2017-11-24: 2 [IU] via SUBCUTANEOUS
  Administered 2017-11-24: 5 [IU] via SUBCUTANEOUS
  Administered 2017-11-25 – 2017-11-27 (×3): 2 [IU] via SUBCUTANEOUS
  Administered 2017-11-27: 3 [IU] via SUBCUTANEOUS

## 2017-11-24 MED ORDER — HEPARIN SODIUM (PORCINE) 5000 UNIT/ML IJ SOLN
5000.0000 [IU] | Freq: Three times a day (TID) | INTRAMUSCULAR | Status: DC
  Administered 2017-11-24 – 2017-11-27 (×10): 5000 [IU] via SUBCUTANEOUS
  Filled 2017-11-24 (×10): qty 1

## 2017-11-24 MED ORDER — ONDANSETRON HCL 4 MG/2ML IJ SOLN: 4.0000 mg | Freq: Four times a day (QID) | INTRAMUSCULAR | Status: DC | PRN

## 2017-11-24 MED ORDER — ATORVASTATIN CALCIUM 10 MG PO TABS
10.0000 mg | ORAL_TABLET | Freq: Every day | ORAL | Status: DC
  Administered 2017-11-24 – 2017-11-27 (×4): 10 mg via ORAL
  Filled 2017-11-24 (×4): qty 1

## 2017-11-24 MED ORDER — INSULIN ASPART 100 UNIT/ML ~~LOC~~ SOLN
3.0000 [IU] | Freq: Three times a day (TID) | SUBCUTANEOUS | Status: DC
  Administered 2017-11-24 – 2017-11-25 (×2): 3 [IU] via SUBCUTANEOUS

## 2017-11-24 MED ORDER — ONDANSETRON HCL 4 MG PO TABS
4.0000 mg | ORAL_TABLET | Freq: Four times a day (QID) | ORAL | Status: DC | PRN
Start: 2017-11-24 — End: 2017-11-28

## 2017-11-24 MED ORDER — HYDROCODONE-ACETAMINOPHEN 5-325 MG PO TABS
1.0000 | ORAL_TABLET | Freq: Four times a day (QID) | ORAL | Status: DC | PRN
  Administered 2017-11-25 – 2017-11-26 (×3): 1 via ORAL
  Filled 2017-11-24 (×4): qty 1

## 2017-11-24 MED ORDER — FAMOTIDINE 20 MG PO TABS
20.0000 mg | ORAL_TABLET | Freq: Two times a day (BID) | ORAL | Status: DC
  Administered 2017-11-24 – 2017-11-27 (×9): 20 mg via ORAL
  Filled 2017-11-24 (×9): qty 1

## 2017-11-24 MED ORDER — GLIPIZIDE 5 MG PO TABS
5.0000 mg | ORAL_TABLET | Freq: Every day | ORAL | Status: DC
  Administered 2017-11-24 – 2017-11-27 (×4): 5 mg via ORAL
  Filled 2017-11-24 (×4): qty 1

## 2017-11-24 NOTE — Progress Notes (Signed)
Patient urinating from urethra/vaginally as well as from suprapubic catheter. Had large amount of urine on pad at the start of shift this AM. Patient states that this happens "sometimes". Placed a female external catheter to better monitor output and to protect skin from MASD.  [name] [facility] 11/24/2017 8:42 AM

## 2017-11-24 NOTE — Progress Notes (Signed)
PROGRESS NOTE  [name] PTW:[id number] DOB: 10-01-1960 DOA: 11/23/2017 PCP: Patient, [name]  HPI/Recap of past 24 hours: [name] is a 57 y.o. female with medical history significant for multiple sclerosis, neurogenic bladder with indwelling suprapubic urine catheter, DM2, HTN, UTI with ESBL E. Coli 2018.  Patient presented to the ED with complaints of right-sided flank pain radiating to groin that started 2 weeks ago she denies fever or chills.  No nausea vomiting. Recent hospital admission-4/1- 09/19/17- for catheter associated pyelonephritis. Bilateral hydronephrosis/nephrolithiasis and suprapubic catheter leakage-underwent cystoscopy retrograde pyelograms left ureteroscopy and extraction of the stones and placement of double-J stent.  Patient was placed on IV meropenem.  Urine cultures grew multiple species. In the ED, pt tachycardic to 113, UA moderate leukocytes many bacteria. Renal stone study-left ureteral stent in place, persistent left hydronephrosis with ureteral thickening and perinephric stranding similar to last exam. Urology was consulted in the ED-Dr. Mackenzie-recommends leaving stents in place, no additional urology intervention, suspicion for infection. Urine cultures were obtained. Patient was started on IV meropenem in the ED. 1L bolus given. Pt admitted for further management  Today, pt reported feeling better, denies any new complaints, still with some suprapubic tenderness.     Assessment/Plan: Principal Problem:   Pyelonephritis Active Problems:   Multiple sclerosis diagnosed 2002-not on Therapy any longer   Chronic indwelling Foley catheter   Neurogenic bladder   Essential hypertension   Bilateral hydronephrosis  Sepsis 2/2 pyelonephritis Tachycardia, leukocytosis on presentation Currently afebrile with leukocytosis UA with mod leukocytosis, many bacteria, > 50 WBC UC pending, BC pending collection CT abdomen-pyelonephritis suggested, also bilateral  hydronephrosis, left ureteral stent in place Urology consulted in ED- Dr. Mackenzie-recommends leaving stents in place, no additional urology intervention, but with continued pyelitis on the left and hydronephrosis along with air in bladder, there is suspicion for infection  Continue IV meropenem Continue gentle hydration Monitor closely  Bilateral hydronephrosis CT also with bilateral hydronephrosis. Patient subsequently underwent cystoscopy retrograde pyelograms left ureteroscopy and extraction of the stones and placement of double-J stent.  Urology did not recommend additional intervention at this time  DM type 2 Last hemoglobin A1c 09/14/2017 was 7.4. Continue home glipizide, hold metformin for now SSI, novolog TID, accuchecks  Anemia of chronic disease Baseline ~8-9, currently at baseline Monitor closely  HTN BP on the soft side Hold home 2.5mg  lisinopril for now  Chronic indwelling suprapubic catheter  Multiple sclerosis Mostly bedbound ambulates with motorized wheelchair Nursing home resident      Code Status: Full  Family Communication: None at bedside  Disposition Plan: Back to SNF once stable   Consultants:  None  Procedures:  None   Antimicrobials:  IV Meropenem  DVT prophylaxis:  Heparin   Objective: Vitals:   11/23/17 2342 11/24/17 0233 11/24/17 0438 11/24/17 1344  BP: 137/77 140/73 139/87 117/74  Pulse: (!) 113 88 (!) 116 (!) 101  Resp: 17 18 18 16   Temp: 98.9 F (37.2 C) 98.8 F (37.1 C) 99.3 F (37.4 C) 98.4 F (36.9 C)  TempSrc: Oral  Oral Oral  SpO2: 100% 100% 100% 98%  Weight:   65.7 kg (144 lb 13.5 oz)   Height:   5\' 5"  (1.651 m)     Intake/Output Summary (Last 24 hours) at 11/24/2017 1455 Last data filed at 11/24/2017 1229 Gross per 24 hour  Intake 726.67 ml  Output 500 ml  Net 226.67 ml   Filed Weights   11/23/17 1801 11/24/17 0438  Weight: 63.5 kg (140 lb) 65.7 kg (144 lb 13.5 oz)    Exam:   General:   NAD  Cardiovascular: S1, S2 present  Respiratory: CTAB   Abdomen: Soft, suprapubic tenderness, ND, BS present  Musculoskeletal: BLE contracted due to MS, no pedal edema   Skin: Normal  Psychiatry: Normal mood   Data Reviewed: CBC: Recent Labs  Lab 11/23/17 2148  WBC 12.6*  NEUTROABS 8.2*  HGB 10.9*  HCT 34.4*  MCV 93.5  PLT 322*   Basic Metabolic Panel: Recent Labs  Lab 11/23/17 2148  NA 143  K 4.9  CL 108  CO2 25  GLUCOSE 170*  BUN 24*  CREATININE 0.95  CALCIUM 10.9*   GFR: Estimated Creatinine Clearance: 57.4 mL/min (by C-G formula based on SCr of 0.95 mg/dL). Liver Function Tests: Recent Labs  Lab 11/23/17 2148  AST 20  ALT 17  ALKPHOS 76  BILITOT 0.7  PROT 9.0*  ALBUMIN 3.7   Recent Labs  Lab 11/23/17 2148  LIPASE 33   No results for input(s): AMMONIA in the last 168 hours. Coagulation Profile: No results for input(s): INR, PROTIME in the last 168 hours. Cardiac Enzymes: No results for input(s): CKTOTAL, CKMB, CKMBINDEX, TROPONINI in the last 168 hours. BNP (last 3 results) No results for input(s): PROBNP in the last 8760 hours. HbA1C: No results for input(s): HGBA1C in the last 72 hours. CBG: Recent Labs  Lab 11/24/17 0806 11/24/17 1200  GLUCAP 251* 251*   Lipid Profile: No results for input(s): CHOL, HDL, LDLCALC, TRIG, CHOLHDL, LDLDIRECT in the last 72 hours. Thyroid Function Tests: No results for input(s): TSH, T4TOTAL, FREET4, T3FREE, THYROIDAB in the last 72 hours. Anemia Panel: No results for input(s): VITAMINB12, FOLATE, FERRITIN, TIBC, IRON, RETICCTPCT in the last 72 hours. Urine analysis:    Component Value Date/Time   COLORURINE AMBER (A) 11/23/2017 2148   APPEARANCEUR TURBID (A) 11/23/2017 2148   LABSPEC 1.013 11/23/2017 2148   PHURINE 7.0 11/23/2017 2148   GLUCOSEU NEGATIVE 11/23/2017 2148   HGBUR MODERATE (A) 11/23/2017 2148   BILIRUBINUR NEGATIVE 11/23/2017 2148   KETONESUR NEGATIVE 11/23/2017 2148    PROTEINUR 100 (A) 11/23/2017 2148   UROBILINOGEN 0.2 12/20/2014 1851   NITRITE NEGATIVE 11/23/2017 2148   LEUKOCYTESUR MODERATE (A) 11/23/2017 2148   Sepsis Labs: @LABRCNTIP (procalcitonin:4,lacticidven:4)  ) Recent Results (from the past 240 hour(s))  MRSA PCR Screening     Status: None   Collection Time: 11/24/17  3:48 AM  Result Value Ref Range Status   MRSA by PCR NEGATIVE NEGATIVE Final    Comment:        The GeneXpert MRSA Assay (FDA approved for NASAL specimens only), is one component of a comprehensive MRSA colonization surveillance program. It is not intended to diagnose MRSA infection nor to guide or monitor treatment for MRSA infections. Performed at [facility], [place] [address] [place], [place] [address]       Studies: Ct Renal Stone Study  Result Date: 11/23/2017 CLINICAL DATA:  Flank and lower abdominal pain. Recurrent stone disease suspected. EXAM: CT ABDOMEN AND PELVIS WITHOUT CONTRAST TECHNIQUE: Multidetector CT imaging of the abdomen and pelvis was performed following the standard protocol without IV contrast. COMPARISON:  Most recent CT 09/11/2017 FINDINGS: Lower chest: Mild bibasilar atelectasis or scarring. No pleural fluid. Hepatobiliary: Few scattered subcentimeter hypodensities in the liver are too small to accurately characterize, likely cysts or hemangiomas. Gallbladder physiologically distended, no calcified stone. No biliary dilatation. Pancreas: No ductal dilatation or  inflammation. Spleen: Normal in size without focal abnormality. Adrenals/Urinary Tract: Normal adrenal glands. Moderate right hydroureteronephrosis with ureteral thickening, similar to prior exam. Slight improvement in right periureteric fat stranding. Ureter is dilated to the bladder insertion. No obstructing stone. Punctate nonobstructing calculi in the right mid kidney. Small hyperdensity in the posterior right renal cortex is likely a hemorrhagic cyst. Left ureteral  stent in place. Left hydronephrosis and parapelvic cysts are similar to prior exam, allowing for noncontrast appearance. Proximal pigtail is in the renal pelvis, distal pigtail in the urinary bladder. There is left ureteral thickening and periureteric stranding, which is mildly improved from prior. Small nonobstructing stones in the upper and lower left kidney. No stones or stone fragments along the course of the ureteral stent. Urinary bladder is decompressed around a suprapubic tube. No stranding about the catheter tract. Stomach/Bowel: Stomach is within normal limits. Appendix appears normal. No evidence of bowel wall thickening, distention, or inflammatory changes. Vascular/Lymphatic: Mild aortic atherosclerosis. Multiple prominent retroperitoneal lymph nodes appear similar to prior exam. No progressive adenopathy. Reproductive: Lobular heterogeneous uterus with underlying fibroids. No suspicious adnexal mass. Other: No free air, free fluid, or intra-abdominal fluid collection. Tiny fat containing umbilical hernia. Musculoskeletal: There are no acute or suspicious osseous abnormalities. IMPRESSION: 1. Left ureteral stent in place. Persistent left hydronephrosis with ureteral thickening and perinephric stranding appears similar to exam last month. Previous stone in the distal ureter is no longer seen, and no stones along the course of the ureteral stent. 2. Moderate right hydronephrosis and ureteral thickening appears similar to prior exam, improvement in periureteric stranding. 3. Bilateral nonobstructing renal calculi. 4. Remainder the exam is unchanged. Aortic Atherosclerosis (ICD10-I70.0). Electronically Signed   By: Jeb Levering M.D.   On: 11/23/2017 21:25    Scheduled Meds: . atorvastatin  10 mg Oral QHS  . famotidine  20 mg Oral BID  . fentaNYL (SUBLIMAZE) injection  50 mcg Intravenous Once  . gabapentin  100 mg Oral QHS  . glipiZIDE  5 mg Oral QAC breakfast  . heparin  5,000 Units  Subcutaneous Q8H  . insulin aspart  0-9 Units Subcutaneous TID WC    Continuous Infusions: . sodium chloride    . meropenem (MERREM) IV Stopped (11/24/17 0919)     LOS: 0 days     Alma Friendly, MD [facility]   If 7PM-7AM, please contact night-coverage [email or web address] Password [facility] 11/24/2017, 2:55 PM

## 2017-11-24 NOTE — ED Notes (Signed)
ED TO INPATIENT HANDOFF REPORT  Name/Age/Gender [name] 57 y.o. female  Code Status Code Status History    Date Active Date Inactive Code Status Order ID Comments User Context   09/12/2017 0023 09/19/2017 1758 Full Code [id number]  Etta Quill, DO ED   08/15/2016 1351 08/19/2016 2106 Full Code [id number]  Franchot Gallo, MD Inpatient   08/15/2016 1028 08/15/2016 1224 Full Code [id number]  Franchot Gallo, MD Outpatient   07/12/2016 2051 07/28/2016 1910 Full Code [id number]  Juanito Doom, MD Inpatient   05/05/2016 0226 05/17/2016 1820 Full Code [id number]  Corey [facility], NP ED   03/08/2016 1703 03/20/2016 1650 Full Code [id number]  Germain Osgood, PA-C ED   12/18/2015 1516 12/31/2015 0207 Full Code [id number]  Bonnell Public, MD Inpatient   11/12/2015 1637 11/19/2015 0128 DNR [id number]  Allie Bossier, MD Inpatient   11/11/2015 1242 11/12/2015 1637 Full Code [id number]  Waldemar Dickens, MD ED   08/31/2015 0640 09/01/2015 1819 Full Code [id number]  Rise Patience, MD Inpatient   07/20/2015 2117 07/23/2015 1825 Full Code [id number]  Toy Baker, MD Inpatient   07/05/2015 2232 07/09/2015 1716 Full Code [id number]  Rise Patience, MD Inpatient   12/31/2014 1559 01/03/2015 1750 Full Code [id number]  Nita Sells, MD Inpatient   12/14/2014 0521 12/21/2014 1832 Full Code [id number]  Mariea Clonts, MD Inpatient   12/13/2014 2013 12/14/2014 0521 Full Code [id number]  Nita Sells, MD Inpatient   06/25/2012 2315 06/30/2012 1448 Full Code [id number]  Arlyss Repress., MD ED      Home/SNF/Other Home  Chief Complaint Abdominal pain   Level of Care/Admitting Diagnosis ED Disposition    ED Disposition Condition [facility] Area: [facility] [[id number]]  Level of Care: Telemetry [5]  Admit to tele based on following criteria: Complex arrhythmia (Bradycardia/Tachycardia)  Diagnosis: Pyelonephritis [009381]  Admitting Physician: Kara Pacer  Attending Physician: Bethena Roys [phone]  Estimated length of stay: past midnight tomorrow  Certification:: I certify this patient will need inpatient services for at least 2 midnights  PT Class (Do Not Modify): Inpatient [101]  PT Acc Code (Do Not Modify): Private [1]       Medical History Past Medical History:  Diagnosis Date  . Acute parametritis and pelvic cellulitis   . Acute renal failure superimposed on stage 3 chronic kidney disease ([place]) 07/21/2015  . Anemia    chronic   . Contracture, right hand   . Diabetes mellitus without complication ([place])   . Diabetic neuropathy ([place])   . DVT (deep venous thrombosis) ([place])   . DVT (deep venous thrombosis) ([place])   . GERD (gastroesophageal reflux disease)   . History of kidney stones   . Hx of sepsis   . Hyperlipidemia   . Hypertension   . Hypokalemia   . Hypomagnesemia   . Left nephrolithiasis   . Leukocytosis   . Lymph edema    chronic   . Malnutrition of moderate degree 07/21/2015  . Multiple sclerosis diagnosed 2002-not on Therapy any longer 06/26/2012  . Muscle weakness (generalized)   . Neuromuscular disorder ([place])   . Neuromuscular dysfunction of bladder   . Polyneuropathy   . Presence of indwelling urinary catheter   . Protein calorie malnutrition ([place])   . Stage 4 decubitus ulcer ([place]) 07/05/2015  . Stroke ([place])   . UTI (lower urinary tract infection)     Allergies No Known Allergies  IV Location/Drains/Wounds Patient Lines/Drains/Airways Status   Active Line/Drains/Airways    Name:   Placement date:   Placement time:   Site:   Days:   Peripheral IV 09/18/17 Posterior;Right Hand   09/18/17    1210    Hand   67   Peripheral IV 11/23/17 Left Hand   11/23/17    2136    Hand   1   Suprapubic Catheter Non-latex 16 Fr.   11/17/16    1709    Non-latex   372   Suprapubic Catheter 20 Fr.   09/14/17    1245    -   71   Ureteral Drain/Stent Left ureter 6 Fr.   09/18/17    1257    Left ureter   67    Incision (Closed) 08/18/16 Back   08/18/16    1441     463   Pressure Injury 05/05/16 Stage II -  Partial thickness loss of dermis presenting as a shallow open ulcer with a red, pink wound bed without slough. Stage 2 pressure sore to Sacrum   05/05/16    0330     568   Pressure Injury 05/05/16 Stage I -  Intact skin with non-blanchable redness of a localized area usually over a bony prominence. to bottom/ crease of buttocks. Healing appearing   05/05/16    0330     568   Wound / Incision (Open or Dehisced) 08/31/15 Laceration Buttocks Left;Lower skin tear on lower left buttocks near skin fold   08/31/15    0615    Buttocks   816   Wound / Incision (Open or Dehisced) 07/12/16 Venous stasis ulcer Foot Right;Left Hardened, blackened areas on bilateral feet and ankles    07/12/16    2100    Foot   500          Labs/Imaging Results for orders placed or performed during the hospital encounter of 11/23/17 (from the past 48 hour(s))  Comprehensive metabolic panel     Status: Abnormal   Collection Time: 11/23/17  9:48 PM  Result Value Ref Range   Sodium 143 135 - 145 mmol/L   Potassium 4.9 3.5 - 5.1 mmol/L   Chloride 108 101 - 111 mmol/L   CO2 25 22 - 32 mmol/L   Glucose, Bld 170 (H) 65 - 99 mg/dL   BUN 24 (H) 6 - 20 mg/dL   Creatinine, Ser 0.95 0.44 - 1.00 mg/dL   Calcium 10.9 (H) 8.9 - 10.3 mg/dL   Total Protein 9.0 (H) 6.5 - 8.1 g/dL   Albumin 3.7 3.5 - 5.0 g/dL   AST 20 15 - 41 U/L   ALT 17 14 - 54 U/L   Alkaline Phosphatase 76 38 - 126 U/L   Total Bilirubin 0.7 0.3 - 1.2 mg/dL   GFR calc non Af Amer >60 >60 mL/min   GFR calc Af Amer >60 >60 mL/min    Comment: (NOTE) The eGFR has been calculated using the CKD EPI equation. This calculation has not been validated in all clinical situations. eGFR's persistently <60 mL/min signify possible Chronic Kidney Disease.    Anion gap 10 5 - 15    Comment: Performed at [facility], [place] [address], [place], [place] [address]   Lipase, blood     Status: None   Collection Time: 11/23/17  9:48 PM  Result Value Ref Range   Lipase 33 11 - 51 U/L    Comment: Performed at [facility]  [place] [address], [place], [place] [address]  CBC with Differential     Status: Abnormal   Collection Time: 11/23/17  9:48 PM  Result Value Ref Range   WBC 12.6 (H) 4.0 - 10.5 K/uL   RBC 3.68 (L) 3.87 - 5.11 MIL/uL   Hemoglobin 10.9 (L) 12.0 - 15.0 g/dL   HCT 34.4 (L) 36.0 - 46.0 %   MCV 93.5 78.0 - 100.0 fL   MCH 29.6 26.0 - 34.0 pg   MCHC 31.7 30.0 - 36.0 g/dL   RDW 16.6 (H) 11.5 - 15.5 %   Platelets 571 (H) 150 - 400 K/uL   Neutrophils Relative % 65 %   Lymphocytes Relative 24 %   Monocytes Relative 7 %   Eosinophils Relative 3 %   Basophils Relative 1 %   Neutro Abs 8.2 (H) 1.7 - 7.7 K/uL   Lymphs Abs 3.0 0.7 - 4.0 K/uL   Monocytes Absolute 0.9 0.1 - 1.0 K/uL   Eosinophils Absolute 0.4 0.0 - 0.7 K/uL   Basophils Absolute 0.1 0.0 - 0.1 K/uL   RBC Morphology POLYCHROMASIA PRESENT     Comment: Performed at [facility], [place] [address], [place], [place] [address]  Urinalysis, Routine w reflex microscopic     Status: Abnormal   Collection Time: 11/23/17  9:48 PM  Result Value Ref Range   Color, Urine AMBER (A) YELLOW    Comment: BIOCHEMICALS MAY BE AFFECTED BY COLOR   APPearance TURBID (A) CLEAR   Specific Gravity, Urine 1.013 1.005 - 1.030   pH 7.0 5.0 - 8.0   Glucose, UA NEGATIVE NEGATIVE mg/dL   Hgb urine dipstick MODERATE (A) NEGATIVE   Bilirubin Urine NEGATIVE NEGATIVE   Ketones, ur NEGATIVE NEGATIVE mg/dL   Protein, ur 100 (A) NEGATIVE mg/dL   Nitrite NEGATIVE NEGATIVE   Leukocytes, UA MODERATE (A) NEGATIVE   RBC / HPF >50 (H) 0 - 5 RBC/hpf   WBC, UA >50 (H) 0 - 5 WBC/hpf   Bacteria, UA MANY (A) NONE SEEN   Mucus PRESENT     Comment: Performed at [facility], [place] [address], [place], [place] [address]   Ct Renal Stone Study  Result Date:  11/23/2017 CLINICAL DATA:  Flank and lower abdominal pain. Recurrent stone disease suspected. EXAM: CT ABDOMEN AND PELVIS WITHOUT CONTRAST TECHNIQUE: Multidetector CT imaging of the abdomen and pelvis was performed following the standard protocol without IV contrast. COMPARISON:  Most recent CT 09/11/2017 FINDINGS: Lower chest: Mild bibasilar atelectasis or scarring. No pleural fluid. Hepatobiliary: Few scattered subcentimeter hypodensities in the liver are too small to accurately characterize, likely cysts or hemangiomas. Gallbladder physiologically distended, no calcified stone. No biliary dilatation. Pancreas: No ductal dilatation or inflammation. Spleen: Normal in size without focal abnormality. Adrenals/Urinary Tract: Normal adrenal glands. Moderate right hydroureteronephrosis with ureteral thickening, similar to prior exam. Slight improvement in right periureteric fat stranding. Ureter is dilated to the bladder insertion. No obstructing stone. Punctate nonobstructing calculi in the right mid kidney. Small hyperdensity in the posterior right renal cortex is likely a hemorrhagic cyst. Left ureteral stent in place. Left hydronephrosis and parapelvic cysts are similar to prior exam, allowing for noncontrast appearance. Proximal pigtail is in the renal pelvis, distal pigtail in the urinary bladder. There is left ureteral thickening and periureteric stranding, which is mildly improved from prior. Small nonobstructing stones in the upper and lower left kidney. No stones or stone fragments along the course of the ureteral  stent. Urinary bladder is decompressed around a suprapubic tube. No stranding about the catheter tract. Stomach/Bowel: Stomach is within normal limits. Appendix appears normal. No evidence of bowel wall thickening, distention, or inflammatory changes. Vascular/Lymphatic: Mild aortic atherosclerosis. Multiple prominent retroperitoneal lymph nodes appear similar to prior exam. No progressive  adenopathy. Reproductive: Lobular heterogeneous uterus with underlying fibroids. No suspicious adnexal mass. Other: No free air, free fluid, or intra-abdominal fluid collection. Tiny fat containing umbilical hernia. Musculoskeletal: There are no acute or suspicious osseous abnormalities. IMPRESSION: 1. Left ureteral stent in place. Persistent left hydronephrosis with ureteral thickening and perinephric stranding appears similar to exam last month. Previous stone in the distal ureter is no longer seen, and no stones along the course of the ureteral stent. 2. Moderate right hydronephrosis and ureteral thickening appears similar to prior exam, improvement in periureteric stranding. 3. Bilateral nonobstructing renal calculi. 4. Remainder the exam is unchanged. Aortic Atherosclerosis (ICD10-I70.0). Electronically Signed   By: Jeb Levering M.D.   On: 11/23/2017 21:25    Pending Labs Unresulted Labs (From admission, onward)   Start     Ordered   11/24/17 0021  Culture, blood (routine x 2)  BLOOD CULTURE X 2,   R     11/24/17 0020   11/23/17 1814  Urine culture  STAT,   STAT    Question:  Patient immune status  Answer:  Normal   11/23/17 1814   Signed and Held  CBC  Tomorrow morning,   R     Signed and Held      Vitals/Pain Today's Vitals   11/23/17 1934 11/23/17 2000 11/23/17 2030 11/23/17 2342  BP: (!) 166/77 (!) 163/67 (!) 164/78 137/77  Pulse:    (!) 113  Resp: _0 Temp:    98.9 F (37.2 C)  TempSrc:    Oral  SpO2:    100%  Weight:      Height:      PainSc:        Isolation Precautions No active isolations  Medications Medications  fentaNYL (SUBLIMAZE) injection 50 mcg (has no administration in time range)  sodium chloride 0.9 % bolus 1,000 mL (1,000 mLs Intravenous New Bag/Given 11/23/17 2346)  meropenem (MERREM) 1 g in sodium chloride 0.9 % 100 mL IVPB (0 g Intravenous Stopped 11/24/17 0030)    Mobility non-ambulatory

## 2017-11-24 NOTE — H&P (Addendum)
History and Physical    [name] BJS:[id number] DOB: [birth year] DOA: 11/23/2017  PCP: Patient, [name]   Patient coming from: Nursing home resident  Chief Complaint: Right-sided flank pain  HPI: LANEY LOUDERBACK is a 57 y.o. female with medical history significant for multiple sclerosis, neurogenic bladder with indwelling suprapubic urine catheter, DM2, HTN, UTI with ESBL E. Coli 2018.  Patient presented to the ED with complaints of right-sided flank pain radiating to groin that started 2 weeks ago she denies fever or chills.  No nausea vomiting.   Recent hospital admission-4/1- 09/19/17- for catheter associated pyelonephritis. Bilateral hydronephrosis/nephrolithiasis and suprapubic catheter leakage-underwent cystoscopy retrograde pyelograms left ureteroscopy and extraction of the stones and placement of double-J stent.  Patient was placed on IV meropenem.  Urine cultures grew multiple species.  ED Course: Initially stable vitals, but later tachycardic to 113, temperature 97.9.  UA moderate leukocytes many bacteria, glucose 170 otherwise , BMP unremarkable, creatinine mildly elevated at 0.95, lipase 33.  Renal stone study-left ureteral stent in place, persistent left hydronephrosis with ureteral thickening and perinephric stranding similar to last exam. Moderate right hydronephrosis, ureteral thickening, improvement in periureteric stranding(see detailed report).  Urology was consulted in the ED-Dr. Mackenzie-recommends leaving stents in place, no additional urology intervention, with continued by pyelitis on the left and hydronephrosis along with air in bladder there is a suspicion for infection.  Urine cultures were obtained. Patient was started on IV meropenem in the ED. 1L bolus given. Hospitalist was called to admit for urinary tract infection.  Review of Systems: As per HPI otherwise 10 point review of systems negative.   Past Medical History:  Diagnosis Date  . Acute parametritis and  pelvic cellulitis   . Acute renal failure superimposed on stage 3 chronic kidney disease ([place]) 07/21/2015  . Anemia    chronic   . Contracture, right hand   . Diabetes mellitus without complication ([place])   . Diabetic neuropathy ([place])   . DVT (deep venous thrombosis) ([place])   . DVT (deep venous thrombosis) ([place])   . GERD (gastroesophageal reflux disease)   . History of kidney stones   . Hx of sepsis   . Hyperlipidemia   . Hypertension   . Hypokalemia   . Hypomagnesemia   . Left nephrolithiasis   . Leukocytosis   . Lymph edema    chronic   . Malnutrition of moderate degree 07/21/2015  . Multiple sclerosis diagnosed 2002-not on Therapy any longer 06/26/2012  . Muscle weakness (generalized)   . Neuromuscular disorder ([place])   . Neuromuscular dysfunction of bladder   . Polyneuropathy   . Presence of indwelling urinary catheter   . Protein calorie malnutrition ([place])   . Stage 4 decubitus ulcer ([place]) 07/05/2015  . Stroke ([place])   . UTI (lower urinary tract infection)     Past Surgical History:  Procedure Laterality Date  . COLONOSCOPY WITH PROPOFOL N/A 07/25/2016   Procedure: COLONOSCOPY WITH PROPOFOL;  Surgeon: Laurence Spates, MD;  Location: [facility];  Service: Endoscopy;  Laterality: N/A;  . CYSTOSCOPY W/ RETROGRADES Bilateral 09/18/2017   Procedure: CYSTOSCOPY WITH RETROGRADE PYELOGRAM;  Surgeon: Franchot Gallo, MD;  Location: [facility];  Service: Urology;  Laterality: Bilateral;  . CYSTOSCOPY/URETEROSCOPY/HOLMIUM LASER/STENT PLACEMENT Left 09/18/2017   Procedure: CYSTOSCOPY/LEFTURETEROSCOPY/URETERAL STENT PLACEMENT STONE EXTRACTION;  Surgeon: Franchot Gallo, MD;  Location: [facility];  Service: Urology;  Laterality: Left;  . ESOPHAGOGASTRODUODENOSCOPY Left 01/02/2015   Procedure: ESOPHAGOGASTRODUODENOSCOPY (EGD);  Surgeon: Arta Silence, MD;  Location: [facility];  Service: Endoscopy;  Laterality: Left;  . ESOPHAGOGASTRODUODENOSCOPY (EGD) WITH PROPOFOL N/A 07/25/2016   Procedure:  ESOPHAGOGASTRODUODENOSCOPY (EGD) WITH PROPOFOL;  Surgeon: Laurence Spates, MD;  Location: [facility];  Service: Endoscopy;  Laterality: N/A;  . HERNIA MESH REMOVAL    . HOLMIUM LASER APPLICATION Left 8/0/9983   Procedure: HOLMIUM LASER APPLICATION;  Surgeon: Franchot Gallo, MD;  Location: [facility];  Service: Urology;  Laterality: Left;  . IR CATHETER TUBE CHANGE  10/06/2016  . IR CATHETER TUBE CHANGE  11/17/2016  . IR CATHETER TUBE CHANGE  02/10/2017  . IR GENERIC HISTORICAL  02/23/2016   IR NEPHROSTOMY EXCHANGE LEFT 02/23/2016 Sandi Mariscal, MD [facility]  . IR GENERIC HISTORICAL  04/19/2016   IR NEPHROSTOMY EXCHANGE LEFT 04/19/2016 Arne Cleveland, MD [facility]  . IR GENERIC HISTORICAL  05/13/2016   IR NEPHROSTOMY EXCHANGE LEFT 05/13/2016 Corrie Mckusick, DO [facility]  . IR GENERIC HISTORICAL  05/13/2016   IR NEPHROSTOMY PLACEMENT RIGHT 05/13/2016 Corrie Mckusick, DO [facility]  . IR GENERIC HISTORICAL  07/07/2016   IR NEPHROSTOMY EXCHANGE RIGHT 07/07/2016 Jacqulynn Cadet, MD [facility]  . IR GENERIC HISTORICAL  07/07/2016   IR NEPHROSTOMY EXCHANGE LEFT 07/07/2016 Jacqulynn Cadet, MD [facility]  . IR NEPHROSTOGRAM LEFT THRU EXISTING ACCESS  12/28/2016  . IR NEPHROSTOGRAM RIGHT THRU EXISTING ACCESS  12/28/2016  . IR NEPHROSTOMY EXCHANGE LEFT  10/06/2016  . IR NEPHROSTOMY EXCHANGE LEFT  11/17/2016  . IR NEPHROSTOMY EXCHANGE RIGHT  10/06/2016  . IR NEPHROSTOMY EXCHANGE RIGHT  11/17/2016  . NEPHROLITHOTOMY Left 08/18/2016   Procedure: NEPHROLITHOTOMY LEFT PERCUTANEOUS;  Surgeon: Franchot Gallo, MD;  Location: [facility];  Service: Urology;  Laterality: Left;  . NEPHROSTOMY TUBE PLACEMENT ([place] [facility])    . TRANSURETHRAL RESECTION OF BLADDER TUMOR N/A 03/16/2016   Procedure: CYSTOSCOPY BLADDER BIOPSY;  Surgeon: Franchot Gallo, MD;  Location: [facility];  Service: Urology;  Laterality: N/A;  . TUBAL LIGATION    . tubes tided       reports that she quit smoking about 3 years ago. Her smoking use included  cigarettes. She has a 20.00 pack-year smoking history. She has never used smokeless tobacco. She reports that she drinks about 3.0 oz of alcohol per week. She reports that she does not use drugs.  No Known Allergies  Family History  Problem Relation Age of Onset  . Diabetes Mother   . Alzheimer's disease Mother   . Diabetes Father   . Diabetes Sister   . Scoliosis Sister     Prior to Admission medications   Medication Sig Start Date End Date Taking? Authorizing Provider  atorvastatin (LIPITOR) 10 MG tablet Take 10 mg by mouth at bedtime.    Yes [provider]  clindamycin (CLEOCIN T) 1 % external solution Apply 1 application topically daily.   Yes [provider]  doxycycline (VIBRA-TABS) 100 MG tablet Take 100 mg by mouth daily.   Yes [provider]  famotidine (PEPCID) 20 MG tablet Take 1 tablet (20 mg total) by mouth 2 (two) times daily. 11/18/15  Yes Cherene Altes, MD  ferrous sulfate 325 (65 FE) MG EC tablet Take 325 mg by mouth 2 (two) times daily.   Yes [provider]  gabapentin (NEURONTIN) 100 MG capsule Take 1 capsule (100 mg total) by mouth at bedtime. 11/18/15  Yes Cherene Altes, MD  glipiZIDE (GLUCOTROL) 2.5 mg TABS tablet Take 5 mg by mouth daily before breakfast.    Yes [provider]  HYDROcodone-acetaminophen (NORCO/VICODIN) 5-325 MG tablet Take 1 tablet by mouth every 6 (six) hours as needed for moderate pain.   Yes [provider]  lisinopril (PRINIVIL,ZESTRIL) 2.5 MG tablet Take 2.5 mg by mouth daily.  11/30/15  Yes [provider]  metFORMIN (GLUCOPHAGE) 500 MG tablet Take 0.5 tablets (250 mg total) by mouth 2 (two) times daily. Patient taking differently: Take 500 mg by mouth 2 (two) times daily.  04/22/16  Yes Ngetich, Dinah C, NP  neomycin-bacitracin-polymyxin (NEOSPORIN) OINT Apply 1 application topically 2 (two) times daily. To neck area and upper chest 09/19/17  Yes Bonnielee Haff, MD    polyethylene glycol [facility] / GLYCOLAX) packet Take 17 g by mouth 2 (two) times daily. 11/18/15  Yes Cherene Altes, MD  potassium chloride (K-DUR,KLOR-CON) 10 MEQ tablet Take 1 tablet (10 mEq total) by mouth daily. 03/21/16  Yes Bonnielee Haff, MD  pyrithione zinc (HEAD AND SHOULDERS) 1 % shampoo Apply topically every other day.   Yes [provider]  acetaminophen (TYLENOL) 325 MG tablet Take 2 tablets (650 mg total) by mouth every 6 (six) hours as needed for fever. 03/20/16   Bonnielee Haff, MD  albuterol (PROVENTIL) (2.5 MG/3ML) 0.083% nebulizer solution Take 3 mLs (2.5 mg total) by nebulization every 6 (six) hours as needed for wheezing or shortness of breath. 12/30/15   Robbie Lis, MD  Multiple Vitamin (MULTIVITAMIN WITH MINERALS) TABS tablet Take 1 tablet by mouth daily. Patient not taking: Reported on 11/23/2017 07/09/15   Florencia Reasons, MD  oxybutynin (DITROPAN) 5 MG tablet Take 5 mg by mouth 2 (two) times daily as needed for bladder spasms.    [provider]    Physical Exam: Vitals:   11/23/17 1934 11/23/17 2000 11/23/17 2030 11/23/17 2342  BP: (!) 166/77 (!) 163/67 (!) 164/78 137/77  Pulse:    (!) 113  Resp: 17 17 18 17   Temp:    98.9 F (37.2 C)  TempSrc:    Oral  SpO2:    100%  Weight:      Height:        Constitutional: NAD, calm, comfortable Vitals:   11/23/17 1934 11/23/17 2000 11/23/17 2030 11/23/17 2342  BP: (!) 166/77 (!) 163/67 (!) 164/78 137/77  Pulse:    (!) 113  Resp: 17 17 18 17   Temp:    98.9 F (37.2 C)  TempSrc:    Oral  SpO2:    100%  Weight:      Height:       Eyes: PERRL, lids and conjunctivae normal ENMT: Mucous membranes are moist. Posterior pharynx clear of any exudate or lesions.Normal dentition.  Neck: normal, supple, no masses, no thyromegaly Respiratory: clear to auscultation bilaterally, no wheezing, no crackles. Normal respiratory effort. No accessory muscle use.  Cardiovascular: Regular rate and rhythm, no murmurs /  rubs / gallops. No extremity edema. 2+ pedal pulses. No carotid bruits.  Abdomen: Patient requesting that I do not do deep palpation - on light palpation mild abdominal tenderness, no masses palpated.  Bowel sounds positive.  Chronic indwelling suprapubic catheter Musculoskeletal: no clubbing / cyanosis. No joint deformity upper and lower extremities. Good ROM, no contractures. Normal muscle tone.  Skin: no rashes, lesions, ulcers. No induration Neurologic: Right upper extremity- 3/5, LUE- 4/5, 0/5 strength bilateral lower extremity chronic from MS. Psychiatric: Normal judgment and insight. Alert and oriented x 3. Normal mood.    Labs on Admission: I have personally reviewed following labs and imaging studies  CBC: Recent Labs  Lab 11/23/17 2148  WBC 12.6*  NEUTROABS 8.2*  HGB 10.9*  HCT 34.4*  MCV 93.5  PLT 147*   Basic Metabolic Panel: Recent Labs  Lab 11/23/17 2148  NA 143  K 4.9  CL 108  CO2 25  GLUCOSE 170*  BUN 24*  CREATININE 0.95  CALCIUM 10.9*   Liver Function Tests: Recent Labs  Lab 11/23/17 2148  AST 20  ALT 17  ALKPHOS 76  BILITOT 0.7  PROT 9.0*  ALBUMIN 3.7   Recent Labs  Lab 11/23/17 2148  LIPASE 33   Urine analysis:    Component Value Date/Time   COLORURINE AMBER (A) 11/23/2017 2148   APPEARANCEUR TURBID (A) 11/23/2017 2148   LABSPEC 1.013 11/23/2017 2148   PHURINE 7.0 11/23/2017 2148   GLUCOSEU NEGATIVE 11/23/2017 2148   HGBUR MODERATE (A) 11/23/2017 2148   BILIRUBINUR NEGATIVE 11/23/2017 2148   [place] NEGATIVE 11/23/2017 2148   PROTEINUR 100 (A) 11/23/2017 2148   UROBILINOGEN 0.2 12/20/2014 1851   NITRITE NEGATIVE 11/23/2017 2148   LEUKOCYTESUR MODERATE (A) 11/23/2017 2148    Radiological Exams on Admission: Ct Renal Stone Study  Result Date: 11/23/2017 CLINICAL DATA:  Flank and lower abdominal pain. Recurrent stone disease suspected. EXAM: CT ABDOMEN AND PELVIS WITHOUT CONTRAST TECHNIQUE: Multidetector CT imaging of the  abdomen and pelvis was performed following the standard protocol without IV contrast. COMPARISON:  Most recent CT 09/11/2017 FINDINGS: Lower chest: Mild bibasilar atelectasis or scarring. No pleural fluid. Hepatobiliary: Few scattered subcentimeter hypodensities in the liver are too small to accurately characterize, likely cysts or hemangiomas. Gallbladder physiologically distended, no calcified stone. No biliary dilatation. Pancreas: No ductal dilatation or inflammation. Spleen: Normal in size without focal abnormality. Adrenals/Urinary Tract: Normal adrenal glands. Moderate right hydroureteronephrosis with ureteral thickening, similar to prior exam. Slight improvement in right periureteric fat stranding. Ureter is dilated to the bladder insertion. No obstructing stone. Punctate nonobstructing calculi in the right mid kidney. Small hyperdensity in the posterior right renal cortex is likely a hemorrhagic cyst. Left ureteral stent in place. Left hydronephrosis and parapelvic cysts are similar to prior exam, allowing for noncontrast appearance. Proximal pigtail is in the renal pelvis, distal pigtail in the urinary bladder. There is left ureteral thickening and periureteric stranding, which is mildly improved from prior. Small nonobstructing stones in the upper and lower left kidney. No stones or stone fragments along the course of the ureteral stent. Urinary bladder is decompressed around a suprapubic tube. No stranding about the catheter tract. Stomach/Bowel: Stomach is within normal limits. Appendix appears normal. No evidence of bowel wall thickening, distention, or inflammatory changes. Vascular/Lymphatic: Mild aortic atherosclerosis. Multiple prominent retroperitoneal lymph nodes appear similar to prior exam. No progressive adenopathy. Reproductive: Lobular heterogeneous uterus with underlying fibroids. No suspicious adnexal mass. Other: No free air, free fluid, or intra-abdominal fluid collection. Tiny fat  containing umbilical hernia. Musculoskeletal: There are no acute or suspicious osseous abnormalities. IMPRESSION: 1. Left ureteral stent in place. Persistent left hydronephrosis with ureteral thickening and perinephric stranding appears similar to exam last month. Previous stone in the distal ureter is no longer seen, and no stones along the course of the ureteral stent. 2. Moderate right hydronephrosis and ureteral thickening appears similar to prior exam, improvement in periureteric stranding. 3. Bilateral nonobstructing renal calculi. 4. Remainder the exam is unchanged. Aortic Atherosclerosis (ICD10-I70.0). Electronically Signed   By: Jeb Levering M.D.   On: 11/23/2017 21:25    EKG: None   Assessment/Plan Principal Problem:  Pyelonephritis Active Problems:   Multiple sclerosis diagnosed 2002-not on Therapy any longer   Chronic indwelling Foley catheter   Neurogenic bladder   Essential hypertension   Bilateral hydronephrosis   Pyelonephritis-right flank pain, chronic indwelling probably catheter, WBC 12.6.  Now with mild tachycardia.  Patient meeting sepsis criteria. CT abdomen-pyelonephritis suggested, also bilateral hydronephrosis, left ureteral stent in place.  Urology consulted in ED- Dr. Mackenzie-recommends leaving stents in place, no additional urology intervention, but with continued pyelitis on the left and hydronephrosis along with air in bladder there is a suspicion for infection.   -Continue IV meropenem started in ED -Will get blood cultures -Stat lactic acid -Follow-up urine cultures drawn in the ED -Hydrate 1/2 NS 100cc/hr x 12 hrs - CBC a.m.  AKI-creatinine 0.9, baseline 0.5-0.6.  Likely prerenal. 1l bolus given in ED.  -Hydrate 1/2 NS 100cc/hr x 12 hrs -BMP a.m.  Bilateral hydronephrosis-as seen on CT abdomen.  Nephrolithiasis history.  Hospital admission 09/2017- CT also with bilateral hydronephrosis. Patient subsequently underwent cystoscopy retrograde pyelograms  left ureteroscopy and extraction of the stones and placement of double-J stent.  Urology did not recommend additional intervention at this time.  DM-glucose 170.  Last hemoglobin A1c 09/14/2017 7.4. -Continue home glipizide, hold metformin for now - SSi  HTN-blood pressure stable systolic 919-166. -Hold home 2.5mg  lisinopril for now with AKI  Chronic indwelling suprapubic catheter  Multiple sclerosis -Mostly bedbound ambulates with motorized wheelchair -Nursing home resident  DVT prophylaxis: Heparin Code Status: Full Family Communication: None at bedside Disposition Plan: Per rounding team Consults called: Urology Admission status: Inpatient telemetry   Bethena Roys MD [facility] Pager [phone] From 6PM-2AM.  Otherwise please contact night-coverage [email or web address] Password [email or web address]  11/24/2017, 12:20 AM

## 2017-11-24 NOTE — ED Notes (Signed)
ED TO INPATIENT HANDOFF REPORT  Name/Age/Gender [name] 57 y.o. female  Code Status Code Status History    Date Active Date Inactive Code Status Order ID Comments User Context   09/12/2017 0023 09/19/2017 1758 Full Code [id number]  Etta Quill, DO ED   08/15/2016 1351 08/19/2016 2106 Full Code [id number]  Franchot Gallo, MD Inpatient   08/15/2016 1028 08/15/2016 1224 Full Code [id number]  Franchot Gallo, MD Outpatient   07/12/2016 2051 07/28/2016 1910 Full Code [id number]  Juanito Doom, MD Inpatient   05/05/2016 0226 05/17/2016 1820 Full Code [id number]  Corey [facility], NP ED   03/08/2016 1703 03/20/2016 1650 Full Code [id number]  Germain Osgood, PA-C ED   12/18/2015 1516 12/31/2015 0207 Full Code [id number]  Bonnell Public, MD Inpatient   11/12/2015 1637 11/19/2015 0128 DNR [id number]  Allie Bossier, MD Inpatient   11/11/2015 1242 11/12/2015 1637 Full Code [id number]  Waldemar Dickens, MD ED   08/31/2015 0640 09/01/2015 1819 Full Code [id number]  Rise Patience, MD Inpatient   07/20/2015 2117 07/23/2015 1825 Full Code [id number]  Toy Baker, MD Inpatient   07/05/2015 2232 07/09/2015 1716 Full Code [id number]  Rise Patience, MD Inpatient   12/31/2014 1559 01/03/2015 1750 Full Code [id number]  Nita Sells, MD Inpatient   12/14/2014 0521 12/21/2014 1832 Full Code [id number]  Mariea Clonts, MD Inpatient   12/13/2014 2013 12/14/2014 0521 Full Code [id number]  Nita Sells, MD Inpatient   06/25/2012 2315 06/30/2012 1448 Full Code [id number]  Arlyss Repress., MD ED      Home/SNF/Other Home  Chief Complaint Abdominal pain   Level of Care/Admitting Diagnosis ED Disposition    ED Disposition Condition [facility] Area: [facility] [[id number]]  Level of Care: Telemetry [5]  Admit to tele based on following criteria: Complex arrhythmia (Bradycardia/Tachycardia)  Diagnosis: Pyelonephritis [009381]  Admitting Physician: Kara Pacer  Attending Physician: Bethena Roys [phone]  Estimated length of stay: past midnight tomorrow  Certification:: I certify this patient will need inpatient services for at least 2 midnights  PT Class (Do Not Modify): Inpatient [101]  PT Acc Code (Do Not Modify): Private [1]       Medical History Past Medical History:  Diagnosis Date  . Acute parametritis and pelvic cellulitis   . Acute renal failure superimposed on stage 3 chronic kidney disease ([place]) 07/21/2015  . Anemia    chronic   . Contracture, right hand   . Diabetes mellitus without complication ([place])   . Diabetic neuropathy ([place])   . DVT (deep venous thrombosis) ([place])   . DVT (deep venous thrombosis) ([place])   . GERD (gastroesophageal reflux disease)   . History of kidney stones   . Hx of sepsis   . Hyperlipidemia   . Hypertension   . Hypokalemia   . Hypomagnesemia   . Left nephrolithiasis   . Leukocytosis   . Lymph edema    chronic   . Malnutrition of moderate degree 07/21/2015  . Multiple sclerosis diagnosed 2002-not on Therapy any longer 06/26/2012  . Muscle weakness (generalized)   . Neuromuscular disorder ([place])   . Neuromuscular dysfunction of bladder   . Polyneuropathy   . Presence of indwelling urinary catheter   . Protein calorie malnutrition ([place])   . Stage 4 decubitus ulcer ([place]) 07/05/2015  . Stroke ([place])   . UTI (lower urinary tract infection)     Allergies No Known Allergies  IV Location/Drains/Wounds Patient Lines/Drains/Airways Status   Active Line/Drains/Airways    Name:   Placement date:   Placement time:   Site:   Days:   Peripheral IV 09/18/17 Posterior;Right Hand   09/18/17    1210    Hand   67   Peripheral IV 11/23/17 Left Hand   11/23/17    2136    Hand   1   Suprapubic Catheter Non-latex 16 Fr.   11/17/16    1709    Non-latex   372   Suprapubic Catheter 20 Fr.   09/14/17    1245    -   71   Ureteral Drain/Stent Left ureter 6 Fr.   09/18/17    1257    Left ureter   67    Incision (Closed) 08/18/16 Back   08/18/16    1441     463   Pressure Injury 05/05/16 Stage II -  Partial thickness loss of dermis presenting as a shallow open ulcer with a red, pink wound bed without slough. Stage 2 pressure sore to Sacrum   05/05/16    0330     568   Pressure Injury 05/05/16 Stage I -  Intact skin with non-blanchable redness of a localized area usually over a bony prominence. to bottom/ crease of buttocks. Healing appearing   05/05/16    0330     568   Wound / Incision (Open or Dehisced) 08/31/15 Laceration Buttocks Left;Lower skin tear on lower left buttocks near skin fold   08/31/15    0615    Buttocks   816   Wound / Incision (Open or Dehisced) 07/12/16 Venous stasis ulcer Foot Right;Left Hardened, blackened areas on bilateral feet and ankles    07/12/16    2100    Foot   500          Labs/Imaging Results for orders placed or performed during the hospital encounter of 11/23/17 (from the past 48 hour(s))  Comprehensive metabolic panel     Status: Abnormal   Collection Time: 11/23/17  9:48 PM  Result Value Ref Range   Sodium 143 135 - 145 mmol/L   Potassium 4.9 3.5 - 5.1 mmol/L   Chloride 108 101 - 111 mmol/L   CO2 25 22 - 32 mmol/L   Glucose, Bld 170 (H) 65 - 99 mg/dL   BUN 24 (H) 6 - 20 mg/dL   Creatinine, Ser 0.95 0.44 - 1.00 mg/dL   Calcium 10.9 (H) 8.9 - 10.3 mg/dL   Total Protein 9.0 (H) 6.5 - 8.1 g/dL   Albumin 3.7 3.5 - 5.0 g/dL   AST 20 15 - 41 U/L   ALT 17 14 - 54 U/L   Alkaline Phosphatase 76 38 - 126 U/L   Total Bilirubin 0.7 0.3 - 1.2 mg/dL   GFR calc non Af Amer >60 >60 mL/min   GFR calc Af Amer >60 >60 mL/min    Comment: (NOTE) The eGFR has been calculated using the CKD EPI equation. This calculation has not been validated in all clinical situations. eGFR's persistently <60 mL/min signify possible Chronic Kidney Disease.    Anion gap 10 5 - 15    Comment: Performed at [facility], [place] [address], [place], [place] [address]   Lipase, blood     Status: None   Collection Time: 11/23/17  9:48 PM  Result Value Ref Range   Lipase 33 11 - 51 U/L    Comment: Performed at [facility]  [place] [address], [place], [place] [address]  CBC with Differential     Status: Abnormal   Collection Time: 11/23/17  9:48 PM  Result Value Ref Range   WBC 12.6 (H) 4.0 - 10.5 K/uL   RBC 3.68 (L) 3.87 - 5.11 MIL/uL   Hemoglobin 10.9 (L) 12.0 - 15.0 g/dL   HCT 34.4 (L) 36.0 - 46.0 %   MCV 93.5 78.0 - 100.0 fL   MCH 29.6 26.0 - 34.0 pg   MCHC 31.7 30.0 - 36.0 g/dL   RDW 16.6 (H) 11.5 - 15.5 %   Platelets 571 (H) 150 - 400 K/uL   Neutrophils Relative % 65 %   Lymphocytes Relative 24 %   Monocytes Relative 7 %   Eosinophils Relative 3 %   Basophils Relative 1 %   Neutro Abs 8.2 (H) 1.7 - 7.7 K/uL   Lymphs Abs 3.0 0.7 - 4.0 K/uL   Monocytes Absolute 0.9 0.1 - 1.0 K/uL   Eosinophils Absolute 0.4 0.0 - 0.7 K/uL   Basophils Absolute 0.1 0.0 - 0.1 K/uL   RBC Morphology POLYCHROMASIA PRESENT     Comment: Performed at [facility], [place] [address], [place], [place] [address]  Urinalysis, Routine w reflex microscopic     Status: Abnormal   Collection Time: 11/23/17  9:48 PM  Result Value Ref Range   Color, Urine AMBER (A) YELLOW    Comment: BIOCHEMICALS MAY BE AFFECTED BY COLOR   APPearance TURBID (A) CLEAR   Specific Gravity, Urine 1.013 1.005 - 1.030   pH 7.0 5.0 - 8.0   Glucose, UA NEGATIVE NEGATIVE mg/dL   Hgb urine dipstick MODERATE (A) NEGATIVE   Bilirubin Urine NEGATIVE NEGATIVE   Ketones, ur NEGATIVE NEGATIVE mg/dL   Protein, ur 100 (A) NEGATIVE mg/dL   Nitrite NEGATIVE NEGATIVE   Leukocytes, UA MODERATE (A) NEGATIVE   RBC / HPF >50 (H) 0 - 5 RBC/hpf   WBC, UA >50 (H) 0 - 5 WBC/hpf   Bacteria, UA MANY (A) NONE SEEN   Mucus PRESENT     Comment: Performed at [facility], [place] [address], [place], [place] [address]   Ct Renal Stone Study  Result Date:  11/23/2017 CLINICAL DATA:  Flank and lower abdominal pain. Recurrent stone disease suspected. EXAM: CT ABDOMEN AND PELVIS WITHOUT CONTRAST TECHNIQUE: Multidetector CT imaging of the abdomen and pelvis was performed following the standard protocol without IV contrast. COMPARISON:  Most recent CT 09/11/2017 FINDINGS: Lower chest: Mild bibasilar atelectasis or scarring. No pleural fluid. Hepatobiliary: Few scattered subcentimeter hypodensities in the liver are too small to accurately characterize, likely cysts or hemangiomas. Gallbladder physiologically distended, no calcified stone. No biliary dilatation. Pancreas: No ductal dilatation or inflammation. Spleen: Normal in size without focal abnormality. Adrenals/Urinary Tract: Normal adrenal glands. Moderate right hydroureteronephrosis with ureteral thickening, similar to prior exam. Slight improvement in right periureteric fat stranding. Ureter is dilated to the bladder insertion. No obstructing stone. Punctate nonobstructing calculi in the right mid kidney. Small hyperdensity in the posterior right renal cortex is likely a hemorrhagic cyst. Left ureteral stent in place. Left hydronephrosis and parapelvic cysts are similar to prior exam, allowing for noncontrast appearance. Proximal pigtail is in the renal pelvis, distal pigtail in the urinary bladder. There is left ureteral thickening and periureteric stranding, which is mildly improved from prior. Small nonobstructing stones in the upper and lower left kidney. No stones or stone fragments along the course of the ureteral  stent. Urinary bladder is decompressed around a suprapubic tube. No stranding about the catheter tract. Stomach/Bowel: Stomach is within normal limits. Appendix appears normal. No evidence of bowel wall thickening, distention, or inflammatory changes. Vascular/Lymphatic: Mild aortic atherosclerosis. Multiple prominent retroperitoneal lymph nodes appear similar to prior exam. No progressive  adenopathy. Reproductive: Lobular heterogeneous uterus with underlying fibroids. No suspicious adnexal mass. Other: No free air, free fluid, or intra-abdominal fluid collection. Tiny fat containing umbilical hernia. Musculoskeletal: There are no acute or suspicious osseous abnormalities. IMPRESSION: 1. Left ureteral stent in place. Persistent left hydronephrosis with ureteral thickening and perinephric stranding appears similar to exam last month. Previous stone in the distal ureter is no longer seen, and no stones along the course of the ureteral stent. 2. Moderate right hydronephrosis and ureteral thickening appears similar to prior exam, improvement in periureteric stranding. 3. Bilateral nonobstructing renal calculi. 4. Remainder the exam is unchanged. Aortic Atherosclerosis (ICD10-I70.0). Electronically Signed   By: Jeb Levering M.D.   On: 11/23/2017 21:25    Pending Labs Unresulted Labs (From admission, onward)   Start     Ordered   11/24/17 0021  Culture, blood (routine x 2)  BLOOD CULTURE X 2,   R     11/24/17 0020   11/23/17 1814  Urine culture  STAT,   STAT    Question:  Patient immune status  Answer:  Normal   11/23/17 1814   Signed and Held  CBC  Tomorrow morning,   R     Signed and Held      Vitals/Pain Today's Vitals   11/23/17 1934 11/23/17 2000 11/23/17 2030 11/23/17 2342  BP: (!) 166/77 (!) 163/67 (!) 164/78 137/77  Pulse:    (!) 113  Resp: _0 Temp:    98.9 F (37.2 C)  TempSrc:    Oral  SpO2:    100%  Weight:      Height:      PainSc:        Isolation Precautions No active isolations  Medications Medications  fentaNYL (SUBLIMAZE) injection 50 mcg (has no administration in time range)  sodium chloride 0.9 % bolus 1,000 mL (1,000 mLs Intravenous New Bag/Given 11/23/17 2346)  meropenem (MERREM) 1 g in sodium chloride 0.9 % 100 mL IVPB (0 g Intravenous Stopped 11/24/17 0030)    Mobility non-ambulatory

## 2017-11-24 NOTE — Progress Notes (Signed)
CSW consulted to assist with disposition as pt is admitted from facility- [facility], where she is a long term care resident. Known to CSWs from past admissions. Uses Wheelchair at baseline and needs assistance with ADLs other than feeding/eating. Notified facility of pt's admission (for sepsis). Will follow and assist during hospitalization and with transition back to SNF at DC.  See complete assessment below during previous admissions- no major changes in psychosocial situation.  Sharren Bridge, MSW, LCSW Clinical Social Work 11/24/2017 [phone]    Clinical Social Work Assessment  Patient Details  Name: [name] MRN: [id number] Date of Birth: 06-27-1960  Date of referral:  09/13/17               Reason for consult:  Facility Placement                 Permission sought to share information with:  Chartered certified accountant granted to share information::  Yes, Verbal Permission Granted             Name::                   Agency::  [name]             Relationship::                Contact Information:     Housing/Transportation Living arrangements for the past 2 months:  [place] of Information:  Patient Patient Interpreter Needed:  None Criminal Activity/Legal Involvement Pertinent to Current Situation/Hospitalization:  No - Comment as needed Significant Relationships:  Other Family Members Lives with:  Facility Resident Do you feel safe going back to the place where you live?  Yes Need for family participation in patient care:  Yes (Comment)  Care giving concerns:  No care giving concerns at the time of assessment.    Social Worker assessment / plan:  LCSW following for facility placement.   Patient from [facility].   Patient admitted for abdominal pain.  LCSW met at bedside with patient. No family present. According to Epic patient has legal guardian. Patient states that she does not have a legal  guardian.   Patient reports that she has been at [facility] for the past year. At baseline patient is wheelchair bound. Patient requires assistance with ADLs. Patient is able to feed herself.   PLAN: Patient will return to [facility] at [employer].    Employment status:  Disabled (Comment on whether or not currently receiving Disability) Insurance information:  Managed Medicare PT Recommendations:  No Follow Up Information / Referral to community resources:     Patient/Family's Response to care:  Patient is thankful for LCSW visit. Patient reports that she is very satisfied with the care she has received on 5 E. Patient stated " I hate I got sick, but this has been like a mini vacation for me".   Patient/Family's Understanding of and Emotional Response to Diagnosis, Current Treatment, and Prognosis:  Patient is understanding of diagnosis and agreeable of treatment plan. Patient reports that she is glad the doctor found out what was wrong and she is no longer in pain.   Emotional Assessment Appearance:  Appears older than stated age Attitude/Demeanor/Rapport:    Affect (typically observed):  Accepting, Calm Orientation:  Oriented to Self, Oriented to Place, Oriented to  Time Alcohol / Substance use:  Not Applicable Psych involvement (Current and /or in the community):  No (Comment)  Discharge Needs  Concerns to be addressed:  No discharge needs identified Readmission within the last 30 days:  No Current discharge risk:  None Barriers to Discharge:  Continued Medical Work up   [employer], LCSW 09/13/2017, 10:35 AM

## 2017-11-24 NOTE — Progress Notes (Signed)
Unable to obtain labs this morning. Lab technician assessed patient 3 times. MD made aware during progression rounds. Verbal order given to have lab attempt again this afternoon. RN reordered labs and 2 lab technicians attempted 2 sticks on the patient but were unsuccessful in obtaining blood for the labs ordered. MD made aware that we were unable to collect the following that were ordered: CBC, CMET, lactic acid, blood cultures.  [name] 11/24/2017

## 2017-11-24 NOTE — Progress Notes (Signed)
Pharmacy Antibiotic Note  [name] is a 57 y.o. female admitted on 11/23/2017 with pyelonephritis.  Pharmacy has been consulted for meropenem dosing.  Plan: Meropenem 1 Gm IV q8h F/u scr/cultures  Height: 5\' 5"  (165.1 cm) Weight: 140 lb (63.5 kg) IBW/kg (Calculated) : 57  Temp (24hrs), Avg:98.4 F (36.9 C), Min:97.9 F (36.6 C), Max:98.9 F (37.2 C)  Recent Labs  Lab 11/23/17 2148  WBC 12.6*  CREATININE 0.95    Estimated Creatinine Clearance: 57.4 mL/min (by C-G formula based on SCr of 0.95 mg/dL).    No Known Allergies  Antimicrobials this admission: 6/13 meropenem >>    >>   Dose adjustments this admission:   Microbiology results:  BCx:   UCx:    Sputum:    MRSA PCR:  Thank you for allowing pharmacy to be a part of this patient's care.  Dorrene German 11/24/2017 12:37 AM

## 2017-11-25 DIAGNOSIS — N12 Tubulo-interstitial nephritis, not specified as acute or chronic: Secondary | ICD-10-CM

## 2017-11-25 DIAGNOSIS — N319 Neuromuscular dysfunction of bladder, unspecified: Secondary | ICD-10-CM

## 2017-11-25 DIAGNOSIS — I10 Essential (primary) hypertension: Secondary | ICD-10-CM

## 2017-11-25 DIAGNOSIS — N1330 Unspecified hydronephrosis: Secondary | ICD-10-CM

## 2017-11-25 DIAGNOSIS — Z960 Presence of urogenital implants: Secondary | ICD-10-CM

## 2017-11-25 DIAGNOSIS — G35 Multiple sclerosis: Secondary | ICD-10-CM

## 2017-11-25 LAB — CBC WITH DIFFERENTIAL/PLATELET
Basophils Absolute: 0.1 10*3/uL (ref 0.0–0.1)
Basophils Relative: 1 %
EOS ABS: 0.4 10*3/uL (ref 0.0–0.7)
EOS PCT: 4 %
HCT: 26.4 % — ABNORMAL LOW (ref 36.0–46.0)
HEMOGLOBIN: 8.5 g/dL — AB (ref 12.0–15.0)
LYMPHS ABS: 4.7 10*3/uL — AB (ref 0.7–4.0)
LYMPHS PCT: 46 %
MCH: 29.6 pg (ref 26.0–34.0)
MCHC: 32.2 g/dL (ref 30.0–36.0)
MCV: 92 fL (ref 78.0–100.0)
Monocytes Absolute: 0.9 10*3/uL (ref 0.1–1.0)
Monocytes Relative: 9 %
Neutro Abs: 3.9 10*3/uL (ref 1.7–7.7)
Neutrophils Relative %: 40 %
PLATELETS: 488 10*3/uL — AB (ref 150–400)
RBC: 2.87 MIL/uL — AB (ref 3.87–5.11)
RDW: 16.3 % — ABNORMAL HIGH (ref 11.5–15.5)
WBC: 9.9 10*3/uL (ref 4.0–10.5)

## 2017-11-25 LAB — BASIC METABOLIC PANEL
ANION GAP: 4 — AB (ref 5–15)
BUN: 17 mg/dL (ref 6–20)
CO2: 24 mmol/L (ref 22–32)
Calcium: 9.5 mg/dL (ref 8.9–10.3)
Chloride: 114 mmol/L — ABNORMAL HIGH (ref 101–111)
Creatinine, Ser: 0.77 mg/dL (ref 0.44–1.00)
GFR calc Af Amer: >60 mL/min (ref >60)
Glucose, Bld: 154 mg/dL — ABNORMAL HIGH (ref 65–99)
POTASSIUM: 3.9 mmol/L (ref 3.5–5.1)
SODIUM: 142 mmol/L (ref 135–145)

## 2017-11-25 LAB — GLUCOSE, CAPILLARY
GLUCOSE-CAPILLARY: 169 mg/dL — AB (ref 65–99)
GLUCOSE-CAPILLARY: 226 mg/dL — AB (ref 65–99)
GLUCOSE-CAPILLARY: 125 mg/dL — AB (ref 65–99)
GLUCOSE-CAPILLARY: 217 mg/dL — AB (ref 65–99)

## 2017-11-25 LAB — URINE CULTURE: Special Requests: NORMAL

## 2017-11-25 LAB — LACTIC ACID, PLASMA: LACTIC ACID, VENOUS: 0.9 mmol/L (ref 0.5–1.9)

## 2017-11-25 MED ORDER — SODIUM CHLORIDE 0.9 % IV SOLN
INTRAVENOUS | Status: DC
  Administered 2017-11-25: 16:00:00 via INTRAVENOUS

## 2017-11-25 MED ORDER — INSULIN ASPART 100 UNIT/ML ~~LOC~~ SOLN
4.0000 [IU] | Freq: Three times a day (TID) | SUBCUTANEOUS | Status: DC
Start: 2017-11-25 — End: 2017-11-28
  Administered 2017-11-27 (×3): 4 [IU] via SUBCUTANEOUS

## 2017-11-25 NOTE — Progress Notes (Signed)
PROGRESS NOTE  [name] CZY:[id number] DOB: 1960/10/02 DOA: 11/23/2017 PCP: Patient, [name]  HPI/Recap of past 24 hours: [name] is a 57 y.o. female with medical history significant for multiple sclerosis, neurogenic bladder with indwelling suprapubic urine catheter, DM2, HTN, UTI with ESBL E. Coli 2018.  Patient presented to the ED with complaints of right-sided flank pain radiating to groin that started 2 weeks ago she denies fever or chills.  No nausea vomiting. Recent hospital admission-4/1- 09/19/17- for catheter associated pyelonephritis. Bilateral hydronephrosis/nephrolithiasis and suprapubic catheter leakage-underwent cystoscopy retrograde pyelograms left ureteroscopy and extraction of the stones and placement of double-J stent.  Patient was placed on IV meropenem.  Urine cultures grew multiple species. In the ED, pt tachycardic to 113, UA moderate leukocytes many bacteria. Renal stone study-left ureteral stent in place, persistent left hydronephrosis with ureteral thickening and perinephric stranding similar to last exam. Urology was consulted in the ED-Dr. Mackenzie-recommends leaving stents in place, no additional urology intervention, suspicion for infection. Urine cultures were obtained. Patient was started on IV meropenem in the ED. 1L bolus given. Pt admitted for further management  Today, met pt eating her breakfast, pt reported feeling better, denies any new complaints.     Assessment/Plan: Principal Problem:   Pyelonephritis Active Problems:   Multiple sclerosis diagnosed 2002-not on Therapy any longer   Chronic indwelling Foley catheter   Neurogenic bladder   Essential hypertension   Bilateral hydronephrosis  Sepsis 2/2 pyelonephritis Tachycardia, leukocytosis on presentation Currently afebrile with resolved leukocytosis LA WNL UA with mod leukocytosis, many bacteria, > 50 WBC UC showed multiple specie (2 other UC have shown the same result) BC pending  collection CT abdomen-pyelonephritis suggested, also bilateral hydronephrosis, left ureteral stent in place Urology consulted in ED- Dr. Mackenzie-recommends leaving stents in place, no additional urology intervention, but with continued pyelitis on the left and hydronephrosis along with air in bladder, there is suspicion for infection  Continue IV meropenem Continue gentle hydration Monitor closely  Bilateral hydronephrosis CT also with bilateral hydronephrosis. Patient subsequently underwent cystoscopy retrograde pyelograms left ureteroscopy and extraction of the stones and placement of double-J stent Urology did not recommend additional intervention at this time  DM type 2 Last hemoglobin A1c 09/14/2017 was 7.4. Continue home glipizide, hold metformin for now SSI, novolog TID, accuchecks  Anemia of chronic disease Baseline ~8-9, currently at baseline Monitor closely  HTN BP on the soft side Hold home 2.71m lisinopril for now  Chronic indwelling suprapubic catheter  Multiple sclerosis Mostly bedbound ambulates with motorized wheelchair Nursing home resident      Code Status: Full  Family Communication: None at bedside  Disposition Plan: Back to SNF once stable   Consultants:  Urology consulted over phone  Procedures:  None   Antimicrobials:  IV Meropenem  DVT prophylaxis:  Heparin   Objective: Vitals:   11/24/17 1344 11/24/17 2117 11/25/17 0539 11/25/17 1441  BP: 117/74 116/60 106/73 93/65  Pulse: (!) 101 (!) 108 (!) 104 97  Resp: '16 16 14 18  ' Temp: 98.4 F (36.9 C) 99.1 F (37.3 C) 98.6 F (37 C) 98.5 F (36.9 C)  TempSrc: Oral Oral  Oral  SpO2: 98% 97% 98% 97%  Weight:      Height:        Intake/Output Summary (Last 24 hours) at 11/25/2017 1528 Last data filed at 11/25/2017 1000 Gross per 24 hour  Intake 1647.5 ml  Output 325 ml  Net 1322.5 ml   FAutoliv  11/23/17 1801 11/24/17 0438  Weight: 63.5 kg (140 lb) 65.7 kg (144 lb  13.5 oz)    Exam:   General:  NAD  Cardiovascular: S1, S2 present  Respiratory: CTAB   Abdomen: Soft, suprapubic tenderness, ND, BS present  Musculoskeletal: BLE contracted due to MS, no pedal edema   Skin: Normal  Psychiatry: Normal mood   Data Reviewed: CBC: Recent Labs  Lab 11/23/17 2148 11/25/17 0432  WBC 12.6* 9.9  NEUTROABS 8.2* 3.9  HGB 10.9* 8.5*  HCT 34.4* 26.4*  MCV 93.5 92.0  PLT 571* 283*   Basic Metabolic Panel: Recent Labs  Lab 11/23/17 2148 11/25/17 0432  NA 143 142  K 4.9 3.9  CL 108 114*  CO2 25 24  GLUCOSE 170* 154*  BUN 24* 17  CREATININE 0.95 0.77  CALCIUM 10.9* 9.5   GFR: Estimated Creatinine Clearance: 68.1 mL/min (by C-G formula based on SCr of 0.77 mg/dL). Liver Function Tests: Recent Labs  Lab 11/23/17 2148  AST 20  ALT 17  ALKPHOS 76  BILITOT 0.7  PROT 9.0*  ALBUMIN 3.7   Recent Labs  Lab 11/23/17 2148  LIPASE 33   No results for input(s): AMMONIA in the last 168 hours. Coagulation Profile: No results for input(s): INR, PROTIME in the last 168 hours. Cardiac Enzymes: No results for input(s): CKTOTAL, CKMB, CKMBINDEX, TROPONINI in the last 168 hours. BNP (last 3 results) No results for input(s): PROBNP in the last 8760 hours. HbA1C: No results for input(s): HGBA1C in the last 72 hours. CBG: Recent Labs  Lab 11/24/17 1200 11/24/17 1723 11/24/17 2153 11/25/17 0749 11/25/17 1152  GLUCAP 251* 133* 218* 169* 226*   Lipid Profile: No results for input(s): CHOL, HDL, LDLCALC, TRIG, CHOLHDL, LDLDIRECT in the last 72 hours. Thyroid Function Tests: No results for input(s): TSH, T4TOTAL, FREET4, T3FREE, THYROIDAB in the last 72 hours. Anemia Panel: No results for input(s): VITAMINB12, FOLATE, FERRITIN, TIBC, IRON, RETICCTPCT in the last 72 hours. Urine analysis:    Component Value Date/Time   COLORURINE AMBER (A) 11/23/2017 2148   APPEARANCEUR TURBID (A) 11/23/2017 2148   LABSPEC 1.013 11/23/2017 2148    PHURINE 7.0 11/23/2017 2148   GLUCOSEU NEGATIVE 11/23/2017 2148   HGBUR MODERATE (A) 11/23/2017 2148   BILIRUBINUR NEGATIVE 11/23/2017 2148   KETONESUR NEGATIVE 11/23/2017 2148   PROTEINUR 100 (A) 11/23/2017 2148   UROBILINOGEN 0.2 12/20/2014 1851   NITRITE NEGATIVE 11/23/2017 2148   LEUKOCYTESUR MODERATE (A) 11/23/2017 2148   Sepsis Labs: '@[email or web address]' (procalcitonin:4,lacticidven:4)  ) Recent Results (from the past 240 hour(s))  Urine culture     Status: Abnormal   Collection Time: 11/23/17  9:48 PM  Result Value Ref Range Status   Specimen Description   Final    URINE, CATHETERIZED Performed at [facility], [place] [address] [place], [place] [address]    Special Requests   Final    Normal Performed at [facility], [place] [address] [place], [place] [address]    Culture MULTIPLE SPECIES PRESENT, SUGGEST RECOLLECTION (A)  Final   Report Status 11/25/2017 FINAL  Final  MRSA PCR Screening     Status: None   Collection Time: 11/24/17  3:48 AM  Result Value Ref Range Status   MRSA by PCR NEGATIVE NEGATIVE Final    Comment:        The GeneXpert MRSA Assay (FDA approved for NASAL specimens only), is one component of a comprehensive MRSA colonization surveillance program. It is not intended to diagnose  MRSA infection nor to guide or monitor treatment for MRSA infections. Performed at [facility], [place] [address] [place], [place] [address]       Studies: No results found.  Scheduled Meds: . atorvastatin  10 mg Oral QHS  . famotidine  20 mg Oral BID  . gabapentin  100 mg Oral QHS  . glipiZIDE  5 mg Oral QAC breakfast  . heparin  5,000 Units Subcutaneous Q8H  . insulin aspart  0-9 Units Subcutaneous TID WC  . insulin aspart  4 Units Subcutaneous TID WC    Continuous Infusions: . sodium chloride    . meropenem (MERREM) IV Stopped (11/25/17 1321)     LOS: 1 day     Alma Friendly, MD [facility]   If 7PM-7AM, please contact night-coverage [email or web address] Password [facility] 11/25/2017, 3:28 PM

## 2017-11-26 LAB — CBC WITH DIFFERENTIAL/PLATELET
BASOS ABS: 0.1 10*3/uL (ref 0.0–0.1)
BASOS PCT: 1 %
Eosinophils Absolute: 0.4 10*3/uL (ref 0.0–0.7)
Eosinophils Relative: 4 %
HEMATOCRIT: 23.6 % — AB (ref 36.0–46.0)
HEMOGLOBIN: 7.4 g/dL — AB (ref 12.0–15.0)
LYMPHS ABS: 4.9 10*3/uL — AB (ref 0.7–4.0)
Lymphocytes Relative: 53 %
MCH: 28.7 pg (ref 26.0–34.0)
MCHC: 31.4 g/dL (ref 30.0–36.0)
MCV: 91.5 fL (ref 78.0–100.0)
MONOS PCT: 7 %
Monocytes Absolute: 0.7 10*3/uL (ref 0.1–1.0)
Neutro Abs: 3.3 10*3/uL (ref 1.7–7.7)
Neutrophils Relative %: 35 %
Platelets: 450 10*3/uL — ABNORMAL HIGH (ref 150–400)
RBC: 2.58 MIL/uL — AB (ref 3.87–5.11)
RDW: 16.3 % — ABNORMAL HIGH (ref 11.5–15.5)
WBC: 9.3 10*3/uL (ref 4.0–10.5)

## 2017-11-26 LAB — BASIC METABOLIC PANEL
Anion gap: 6 (ref 5–15)
BUN: 15 mg/dL (ref 6–20)
CALCIUM: 9.2 mg/dL (ref 8.9–10.3)
CO2: 23 mmol/L (ref 22–32)
Chloride: 115 mmol/L — ABNORMAL HIGH (ref 101–111)
Creatinine, Ser: 0.71 mg/dL (ref 0.44–1.00)
GFR calc non Af Amer: >60 mL/min (ref >60)
GLUCOSE: 141 mg/dL — AB (ref 65–99)
POTASSIUM: 4.4 mmol/L (ref 3.5–5.1)
Sodium: 144 mmol/L (ref 135–145)

## 2017-11-26 LAB — GLUCOSE, CAPILLARY
GLUCOSE-CAPILLARY: 188 mg/dL — AB (ref 65–99)
GLUCOSE-CAPILLARY: 282 mg/dL — AB (ref 65–99)
Glucose-Capillary: 140 mg/dL — ABNORMAL HIGH (ref 65–99)
Glucose-Capillary: 130 mg/dL — ABNORMAL HIGH (ref 65–99)

## 2017-11-26 MED ORDER — FERROUS SULFATE 325 (65 FE) MG PO TABS
325.0000 mg | ORAL_TABLET | Freq: Two times a day (BID) | ORAL | Status: DC
  Administered 2017-11-26 – 2017-11-27 (×4): 325 mg via ORAL
  Filled 2017-11-26 (×4): qty 1

## 2017-11-26 MED ORDER — OXYBUTYNIN CHLORIDE 5 MG PO TABS: 5.0000 mg | ORAL_TABLET | Freq: Two times a day (BID) | ORAL | Status: DC | PRN

## 2017-11-26 NOTE — Progress Notes (Signed)
Patient c/o severe pain at IV site on left hand. IV line was removed. No open area or redness noted at this moment. Patient states that her whole left arm is painful. So she wants IV to be start tomorrow. MD notified.

## 2017-11-26 NOTE — Progress Notes (Signed)
PROGRESS NOTE  [name] EHU:[id number] DOB: Apr 29, 1961 DOA: 11/23/2017 PCP: Patient, [name]  HPI/Recap of past 24 hours: [name] is a 57 y.o. female with medical history significant for multiple sclerosis, neurogenic bladder with indwelling suprapubic urine catheter, DM2, HTN, UTI with ESBL E. Coli 2018.  Patient presented to the ED with complaints of right-sided flank pain radiating to groin that started 2 weeks ago she denies fever or chills.  No nausea vomiting. Recent hospital admission-4/1- 09/19/17- for catheter associated pyelonephritis. Bilateral hydronephrosis/nephrolithiasis and suprapubic catheter leakage-underwent cystoscopy retrograde pyelograms left ureteroscopy and extraction of the stones and placement of double-J stent.  Patient was placed on IV meropenem.  Urine cultures grew multiple species. In the ED, pt tachycardic to 113, UA moderate leukocytes many bacteria. Renal stone study-left ureteral stent in place, persistent left hydronephrosis with ureteral thickening and perinephric stranding similar to last exam. Urology was consulted in the ED-Dr. Mackenzie-recommends leaving stents in place, no additional urology intervention, suspicion for infection. Urine cultures were obtained. Patient was started on IV meropenem in the ED. 1L bolus given. Pt admitted for further management  Today, pt reported feeling better, denies any new complaints.     Assessment/Plan: Principal Problem:   Pyelonephritis Active Problems:   Multiple sclerosis diagnosed 2002-not on Therapy any longer   Chronic indwelling Foley catheter   Neurogenic bladder   Essential hypertension   Bilateral hydronephrosis  Sepsis 2/2 pyelonephritis Tachycardia, leukocytosis on presentation Currently afebrile with resolved leukocytosis LA WNL UA with mod leukocytosis, many bacteria, > 50 WBC UC showed multiple specie (2 other UC have shown the same result) BC pending collection CT  abdomen-pyelonephritis suggested, also bilateral hydronephrosis, left ureteral stent in place Urology consulted in ED- Dr. Mackenzie-recommends leaving stents in place, no additional urology intervention, but with continued pyelitis on the left and hydronephrosis along with air in bladder, there is suspicion for infection  Continue IV meropenem Monitor closely  Bilateral hydronephrosis CT also with bilateral hydronephrosis. Patient subsequently underwent cystoscopy retrograde pyelograms left ureteroscopy and extraction of the stones and placement of double-J stent Urology did not recommend additional intervention at this time  DM type 2 Uncontrolled Last hemoglobin A1c 09/14/2017 was 7.4. Continue home glipizide, hold metformin for now SSI, novolog TID, accuchecks  Anemia of chronic disease Baseline ~8-9, dropped to 7.4 Anemia panel done in 09/2017: iron 47, sats 22, ferritin 174, folate 7.9 Type and screen, transfuse if <7 Continue iron supplement  Daily CBC  HTN BP on the soft side Hold home 2.5mg  lisinopril for now  Chronic indwelling suprapubic catheter  Multiple sclerosis Mostly bedbound ambulates with motorized wheelchair Nursing home resident      Code Status: Full  Family Communication: None at bedside  Disposition Plan: Back to SNF once stable   Consultants:  Urology consulted over phone  Procedures:  None   Antimicrobials:  IV Meropenem  DVT prophylaxis:  Heparin   Objective: Vitals:   11/25/17 0539 11/25/17 1441 11/25/17 2115 11/26/17 0558  BP: 106/73 93/65 (!) 105/57 (!) 98/58  Pulse: (!) 104 97 99 92  Resp: 14 18 18 18   Temp: 98.6 F (37 C) 98.5 F (36.9 C) 99.4 F (37.4 C) 98.7 F (37.1 C)  TempSrc:  Oral Oral Oral  SpO2: 98% 97% 96% 95%  Weight:      Height:        Intake/Output Summary (Last 24 hours) at 11/26/2017 1353 Last data filed at 11/26/2017 0600 Gross per 24 hour  Intake 1507.5 ml  Output 1500 ml  Net 7.5 ml    Filed Weights   11/23/17 1801 11/24/17 0438  Weight: 63.5 kg (140 lb) 65.7 kg (144 lb 13.5 oz)    Exam:   General:  NAD  Cardiovascular: S1, S2 present  Respiratory: CTAB   Abdomen: Soft, NT, ND, BS present  Musculoskeletal: BLE contracted due to MS, no pedal edema   Skin: Normal  Psychiatry: Normal mood   Data Reviewed: CBC: Recent Labs  Lab 11/23/17 2148 11/25/17 0432 11/26/17 0442  WBC 12.6* 9.9 9.3  NEUTROABS 8.2* 3.9 3.3  HGB 10.9* 8.5* 7.4*  HCT 34.4* 26.4* 23.6*  MCV 93.5 92.0 91.5  PLT 571* 488* 397*   Basic Metabolic Panel: Recent Labs  Lab 11/23/17 2148 11/25/17 0432 11/26/17 0442  NA 143 142 144  K 4.9 3.9 4.4  CL 108 114* 115*  CO2 25 24 23   GLUCOSE 170* 154* 141*  BUN 24* 17 15  CREATININE 0.95 0.77 0.71  CALCIUM 10.9* 9.5 9.2   GFR: Estimated Creatinine Clearance: 68.1 mL/min (by C-G formula based on SCr of 0.71 mg/dL). Liver Function Tests: Recent Labs  Lab 11/23/17 2148  AST 20  ALT 17  ALKPHOS 76  BILITOT 0.7  PROT 9.0*  ALBUMIN 3.7   Recent Labs  Lab 11/23/17 2148  LIPASE 33   No results for input(s): AMMONIA in the last 168 hours. Coagulation Profile: No results for input(s): INR, PROTIME in the last 168 hours. Cardiac Enzymes: No results for input(s): CKTOTAL, CKMB, CKMBINDEX, TROPONINI in the last 168 hours. BNP (last 3 results) No results for input(s): PROBNP in the last 8760 hours. HbA1C: No results for input(s): HGBA1C in the last 72 hours. CBG: Recent Labs  Lab 11/25/17 1152 11/25/17 1640 11/25/17 2121 11/26/17 0730 11/26/17 1215  GLUCAP 226* 125* 217* 140* 188*   Lipid Profile: No results for input(s): CHOL, HDL, LDLCALC, TRIG, CHOLHDL, LDLDIRECT in the last 72 hours. Thyroid Function Tests: No results for input(s): TSH, T4TOTAL, FREET4, T3FREE, THYROIDAB in the last 72 hours. Anemia Panel: No results for input(s): VITAMINB12, FOLATE, FERRITIN, TIBC, IRON, RETICCTPCT in the last 72  hours. Urine analysis:    Component Value Date/Time   COLORURINE AMBER (A) 11/23/2017 2148   APPEARANCEUR TURBID (A) 11/23/2017 2148   LABSPEC 1.013 11/23/2017 2148   PHURINE 7.0 11/23/2017 2148   GLUCOSEU NEGATIVE 11/23/2017 2148   HGBUR MODERATE (A) 11/23/2017 2148   BILIRUBINUR NEGATIVE 11/23/2017 2148   KETONESUR NEGATIVE 11/23/2017 2148   PROTEINUR 100 (A) 11/23/2017 2148   UROBILINOGEN 0.2 12/20/2014 1851   NITRITE NEGATIVE 11/23/2017 2148   LEUKOCYTESUR MODERATE (A) 11/23/2017 2148   Sepsis Labs: @LABRCNTIP (procalcitonin:4,lacticidven:4)  ) Recent Results (from the past 240 hour(s))  Urine culture     Status: Abnormal   Collection Time: 11/23/17  9:48 PM  Result Value Ref Range Status   Specimen Description   Final    URINE, CATHETERIZED Performed at [facility], [place] [address], [place], [place] [address]    Special Requests   Final    Normal Performed at [facility], [place] [address] [place], [place] [address]    Culture MULTIPLE SPECIES PRESENT, SUGGEST RECOLLECTION (A)  Final   Report Status 11/25/2017 FINAL  Final  MRSA PCR Screening     Status: None   Collection Time: 11/24/17  3:48 AM  Result Value Ref Range Status   MRSA by PCR NEGATIVE NEGATIVE Final  Comment:        The GeneXpert MRSA Assay (FDA approved for NASAL specimens only), is one component of a comprehensive MRSA colonization surveillance program. It is not intended to diagnose MRSA infection nor to guide or monitor treatment for MRSA infections. Performed at [facility], [place] [address] [place], [place] [address]       Studies: No results found.  Scheduled Meds: . atorvastatin  10 mg Oral QHS  . famotidine  20 mg Oral BID  . gabapentin  100 mg Oral QHS  . glipiZIDE  5 mg Oral QAC breakfast  . heparin  5,000 Units Subcutaneous Q8H  . insulin aspart  0-9 Units Subcutaneous TID WC  . insulin aspart  4 Units Subcutaneous  TID WC    Continuous Infusions: . sodium chloride 75 mL/hr at 11/25/17 1602  . meropenem (MERREM) IV Stopped (11/26/17 0930)     LOS: 2 days     Alma Friendly, MD [facility]   If 7PM-7AM, please contact night-coverage [email or web address] Password [email or web address] 11/26/2017, 1:53 PM

## 2017-11-26 NOTE — NC FL2 (Signed)
[place] LEVEL OF CARE SCREENING TOOL     IDENTIFICATION  Patient Name: [name] Birthdate: 1961/03/09 Sex: female Admission Date (Current Location): 11/23/2017  [facility] and [place] Number:  Herbalist and Address:  [facility],  [place], [place]      Provider Number: [id number]  Attending Physician Name and Address:  Alma Friendly, MD  Relative Name and Phone Number:       Current Level of Care: Hospital Recommended Level of Care: [place] Prior Approval Number:    Date Approved/Denied:   PASRR Number: [id number]  Discharge Plan: SNF    Current Diagnoses: Patient Active Problem List   Diagnosis Date Noted  . Bilateral hydronephrosis 09/13/2017  . Acute lower UTI 09/13/2017  . UTI due to extended-spectrum beta lactamase (ESBL) producing Escherichia coli 09/12/2017  . Renal calculus 08/15/2016  . Kidney stone 08/15/2016  . Acute metabolic encephalopathy 77/93/9030  . Essential hypertension 07/19/2016  . Hydronephrosis   . Pelvic abscess in female 03/17/2016  . Septic shock ([place]) 03/08/2016  . Neuropathic pain 01/11/2016  . Left nephrolithiasis 12/22/2015  . Pyelonephritis   . Pressure ulcer   . Controlled diabetes mellitus type 2 with complications ([place])   . AKI (acute kidney injury) ([place]) 11/11/2015  . Neurogenic bladder 11/11/2015  . Physical deconditioning 11/11/2015  . Chronic indwelling Foley catheter 09/30/2015  . Dyslipidemia associated with type 2 diabetes mellitus ([place]) 08/28/2015  . GERD without esophagitis 08/28/2015  . Anemia 07/21/2015  . Malnutrition of moderate degree 07/21/2015  . DVT, lower extremity ([place]) 12/28/2014  . Multiple sclerosis diagnosed 2002-not on Therapy any longer 06/26/2012    Orientation RESPIRATION BLADDER Height & Weight     Self, Time, Situation, Place  Normal Incontinent, External catheter Weight: 144 lb 13.5 oz (65.7 kg) Height:  5'  5" (165.1 cm)  BEHAVIORAL SYMPTOMS/MOOD NEUROLOGICAL BOWEL NUTRITION STATUS      Continent Diet(See D/C summary)  AMBULATORY STATUS COMMUNICATION OF NEEDS Skin   Total Care Verbally Normal                       Personal Care Assistance Level of Assistance  Bathing, Dressing Bathing Assistance: Maximum assistance   Dressing Assistance: Maximum assistance     Functional Limitations Info             SPECIAL CARE FACTORS FREQUENCY                       Contractures      Additional Factors Info  Code Status, Allergies Code Status Info: Full Allergies Info: No known allergies           Current Medications (11/26/2017):  This is the current hospital active medication list Current Facility-Administered Medications  Medication Dose Route Frequency Provider Last Rate Last Dose  . 0.9 %  sodium chloride infusion   Intravenous Continuous Alma Friendly, MD 75 mL/hr at 11/25/17 1602    . albuterol (PROVENTIL) (2.5 MG/3ML) 0.083% nebulizer solution 2.5 mg  2.5 mg Nebulization Q6H PRN Emokpae, Ejiroghene E, MD      . atorvastatin (LIPITOR) tablet 10 mg  10 mg Oral QHS Emokpae, Ejiroghene E, MD   10 mg at 11/25/17 2117  . famotidine (PEPCID) tablet 20 mg  20 mg Oral BID Emokpae, Ejiroghene E, MD   20 mg at 11/26/17 0919  . gabapentin (NEURONTIN) capsule 100 mg  100  mg Oral QHS Emokpae, Ejiroghene E, MD   100 mg at 11/25/17 2117  . glipiZIDE (GLUCOTROL) tablet 5 mg  5 mg Oral QAC breakfast Emokpae, Ejiroghene E, MD   5 mg at 11/26/17 0900  . heparin injection 5,000 Units  5,000 Units Subcutaneous Q8H Emokpae, Ejiroghene E, MD   5,000 Units at 11/26/17 0559  . HYDROcodone-acetaminophen (NORCO/VICODIN) 5-325 MG per tablet 1 tablet  1 tablet Oral Q6H PRN Emokpae, Ejiroghene E, MD   1 tablet at 11/25/17 2117  . insulin aspart (novoLOG) injection 0-9 Units  0-9 Units Subcutaneous TID WC Emokpae, Ejiroghene E, MD   2 Units at 11/25/17 0830  . insulin aspart (novoLOG)  injection 4 Units  4 Units Subcutaneous TID WC Alma Friendly, MD      . meropenem (MERREM) 1 g in sodium chloride 0.9 % 100 mL IVPB  1 g Intravenous Q8H Emokpae, Ejiroghene E, MD 200 mL/hr at 11/26/17 0900 1 g at 11/26/17 0900  . ondansetron (ZOFRAN) tablet 4 mg  4 mg Oral Q6H PRN Emokpae, Ejiroghene E, MD       Or  . ondansetron (ZOFRAN) injection 4 mg  4 mg Intravenous Q6H PRN Emokpae, Ejiroghene E, MD       Facility-Administered Medications Ordered in Other Encounters  Medication Dose Route Frequency Provider Last Rate Last Dose  . 0.9 %  sodium chloride infusion  250 mL Intravenous PRN Franchot Gallo, MD      . enoxaparin (LOVENOX) injection 40 mg  40 mg Subcutaneous Q24H Franchot Gallo, MD      . sodium chloride flush (NS) 0.9 % injection 3 mL  3 mL Intravenous Q12H Franchot Gallo, MD      . sodium chloride flush (NS) 0.9 % injection 3 mL  3 mL Intravenous PRN Franchot Gallo, MD         Discharge Medications: Please see discharge summary for a list of discharge medications.  Relevant Imaging Results:  Relevant Lab Results:   Additional [place] Padme Arriaga, LCSWA

## 2017-11-27 LAB — CBC WITH DIFFERENTIAL/PLATELET
BASOS ABS: 0.1 10*3/uL (ref 0.0–0.1)
BASOS PCT: 1 %
EOS PCT: 5 %
Eosinophils Absolute: 0.4 10*3/uL (ref 0.0–0.7)
HCT: 24.6 % — ABNORMAL LOW (ref 36.0–46.0)
Hemoglobin: 7.9 g/dL — ABNORMAL LOW (ref 12.0–15.0)
LYMPHS PCT: 45 %
Lymphs Abs: 4.2 10*3/uL — ABNORMAL HIGH (ref 0.7–4.0)
MCH: 29.7 pg (ref 26.0–34.0)
MCHC: 32.1 g/dL (ref 30.0–36.0)
MCV: 92.5 fL (ref 78.0–100.0)
Monocytes Absolute: 0.6 10*3/uL (ref 0.1–1.0)
Monocytes Relative: 6 %
Neutro Abs: 4 10*3/uL (ref 1.7–7.7)
Neutrophils Relative %: 43 %
Platelets: 465 10*3/uL — ABNORMAL HIGH (ref 150–400)
RBC: 2.66 MIL/uL — ABNORMAL LOW (ref 3.87–5.11)
RDW: 16.2 % — ABNORMAL HIGH (ref 11.5–15.5)
WBC: 9.2 10*3/uL (ref 4.0–10.5)

## 2017-11-27 LAB — GLUCOSE, CAPILLARY
GLUCOSE-CAPILLARY: 185 mg/dL — AB (ref 65–99)
Glucose-Capillary: 162 mg/dL — ABNORMAL HIGH (ref 65–99)
Glucose-Capillary: 184 mg/dL — ABNORMAL HIGH (ref 65–99)
Glucose-Capillary: 214 mg/dL — ABNORMAL HIGH (ref 65–99)

## 2017-11-27 LAB — TYPE AND SCREEN
ABO/RH(D): A POS
Antibody Screen: NEGATIVE

## 2017-11-27 MED ORDER — MEROPENEM 1 G IV SOLR: 1.0000 g | Freq: Three times a day (TID) | INTRAVENOUS | Status: AC

## 2017-11-27 MED ORDER — ENOXAPARIN SODIUM 40 MG/0.4ML ~~LOC~~ SOLN: 40.0000 mg | SUBCUTANEOUS | Status: DC

## 2017-11-27 NOTE — Progress Notes (Signed)
Patient discharged via [facility] to [facility] with PIV in tact.

## 2017-11-27 NOTE — Progress Notes (Signed)
Report called to [place] at [facility]. Patient is being discharged with PIV in place on LAFA placed 11/27/2017 . Patient to received IV antibiotics until 11/30/17. Patient stable for discharge, awaiting [facility] transport.  [name] [facility] 11/27/2017 5:10 PM

## 2017-11-27 NOTE — Discharge Summary (Signed)
Discharge Summary  [name]:[id number] DOB: July 13, 1960  PCP: Patient, [name]  Admit date: 11/23/2017 Discharge date: 11/27/2017  Time spent: 35 mins   Recommendations for Outpatient Follow-up:  1. SNF  Discharge Diagnoses:  Active Hospital Problems   Diagnosis Date Noted  . Pyelonephritis   . Bilateral hydronephrosis 09/13/2017  . Essential hypertension 07/19/2016  . Neurogenic bladder 11/11/2015  . Chronic indwelling Foley catheter 09/30/2015  . Multiple sclerosis diagnosed 2002-not on Therapy any longer 06/26/2012    Resolved Hospital Problems  No resolved problems to display.    Discharge Condition: Stable   Diet recommendation: Heart healthy  Vitals:   11/27/17 0547 11/27/17 1252  BP: 95/68 110/63  Pulse: 82 91  Resp: 18 20  Temp:  98.5 F (36.9 C)  SpO2: 95% 98%    History of present illness:  [name] a 57 y.o.femalewith medical history significantformultiple sclerosis,neurogenic bladder with indwelling suprapubicurinecatheter, DM2, HTN, UTI with ESBLE. [id number].Patient presented to the ED with complaints of right-sided flank pain radiating to groin that started 2 weeks ago she denies fever or chills.No nausea vomiting. Recent hospital admission-4/1- 09/19/17- forcatheter associated pyelonephritis. Bilateral hydronephrosis/nephrolithiasis and suprapubic catheter leakage-underwent cystoscopy retrograde pyelograms left ureteroscopy and extraction of the stones and placement of double-J stent.Patient was placed on IV meropenem.Urine cultures grew multiple species. In the ED, pt tachycardic to 113, UA moderate leukocytes many bacteria.Renal stone study-left ureteral stent in place,persistent left hydronephrosis with ureteral thickening and perinephric stranding similar to last exam. Urology was consulted in the ED-Dr. Mackenzie-recommends leaving stents in place,no additional urology intervention, suspicion for infection. Urine  cultures were obtained. Patient was started on IV meropenem in the ED.1L bolus given.Pt admitted for further management  Today, pt reported feeling better, denies any new complaints. Stable to be d/c to [facility] Course:  Principal Problem:   Pyelonephritis Active Problems:   Multiple sclerosis diagnosed 2002-not on Therapy any longer   Chronic indwelling Foley catheter   Neurogenic bladder   Essential hypertension   Bilateral hydronephrosis   Sepsis 2/2 pyelonephritis Improved Tachycardia, leukocytosis on presentation Currently afebrile with resolved leukocytosis LA WNL UA with mod leukocytosis, many bacteria, > 50 WBC UC showed multiple specie (2 other UC have shown the same result) BC never collected (pt continued to refuse blood draw/IV insertion) CT abdomen-pyelonephritis suggested,also bilateral hydronephrosis,left ureteral stent in place Urology consulted in ED-Dr. Mackenzie-recommends leaving stents in place,no additional urology intervention, butwith continuedpyelitis on the left and hydronephrosis along withair inbladder, there is suspicion for infection Continue IV meropenem for 7 days total, last dose 11/30/17  Bilateral hydronephrosis CT also with bilateral hydronephrosis. Patient subsequentlyunderwent cystoscopy retrograde pyelograms left ureteroscopy and extraction of the stones and placement of double-J stent Urology did not recommend additional intervention at this time  DM type 2 Uncontrolled Last hemoglobin A1c 09/14/2017 was 7.4. Continue home glipizide, metformin  Anemia of chronic disease Baseline ~8-9, dropped to 7.4, ??dilutional Anemia panel done in 09/2017: iron 47, sats 22, ferritin 174, folate 7.9 Continue iron supplement   HTN BP on the soft side, asymptomatic, alert/awake Hold home2.5mg lisinopril  Chronic indwelling suprapubic catheter  Multiple sclerosis Mostly bedbound ambulates withmotorized wheelchair Nursing  home resident     Procedures:  None  Consultations:  Urology over the phone  Discharge Exam: BP 110/63 (BP Location: Left Arm)   Pulse 91   Temp 98.5 F (36.9 C) (Oral)   Resp 20   Ht 5\' 5"  (1.651 m)  Wt 65.7 kg (144 lb 13.5 oz)   SpO2 98%   BMI 24.10 kg/m   General: NAD Cardiovascular: S1, S2 present Respiratory: CTAB   Discharge Instructions You were cared for by a hospitalist during your hospital stay. If you have any questions about your discharge medications or the care you received while you were in the hospital after you are discharged, you can call the unit and asked to speak with the hospitalist on call if the hospitalist that took care of you is not available. Once you are discharged, your primary care physician will handle any further medical issues. Please note that NO REFILLS for any discharge medications will be authorized once you are discharged, as it is imperative that you return to your primary care physician (or establish a relationship with a primary care physician if you do not have one) for your aftercare needs so that they can reassess your need for medications and monitor your lab values.   Allergies as of 11/27/2017   No Known Allergies     Medication List    STOP taking these medications   lisinopril 2.5 MG tablet Commonly known as:  PRINIVIL,ZESTRIL   multivitamin with minerals Tabs tablet   potassium chloride 10 MEQ tablet Commonly known as:  K-DUR,KLOR-CON     TAKE these medications   acetaminophen 325 MG tablet Commonly known as:  TYLENOL Take 2 tablets (650 mg total) by mouth every 6 (six) hours as needed for fever.   albuterol (2.5 MG/3ML) 0.083% nebulizer solution Commonly known as:  PROVENTIL Take 3 mLs (2.5 mg total) by nebulization every 6 (six) hours as needed for wheezing or shortness of breath.   atorvastatin 10 MG tablet Commonly known as:  LIPITOR Take 10 mg by mouth at bedtime.   clindamycin 1 % external  solution Commonly known as:  CLEOCIN T Apply 1 application topically daily.   doxycycline 100 MG tablet Commonly known as:  VIBRA-TABS Take 100 mg by mouth daily.   famotidine 20 MG tablet Commonly known as:  PEPCID Take 1 tablet (20 mg total) by mouth 2 (two) times daily.   ferrous sulfate 325 (65 FE) MG EC tablet Take 325 mg by mouth 2 (two) times daily.   gabapentin 100 MG capsule Commonly known as:  NEURONTIN Take 1 capsule (100 mg total) by mouth at bedtime.   glipiZIDE 2.5 mg Tabs tablet Commonly known as:  GLUCOTROL Take 5 mg by mouth daily before breakfast.   HYDROcodone-acetaminophen 5-325 MG tablet Commonly known as:  NORCO/VICODIN Take 1 tablet by mouth every 6 (six) hours as needed for moderate pain.   meropenem 1 g in sodium chloride 0.9 % 100 mL Inject 1 g into the vein every 8 (eight) hours for 4 days.   metFORMIN 500 MG tablet Commonly known as:  GLUCOPHAGE Take 0.5 tablets (250 mg total) by mouth 2 (two) times daily. What changed:  how much to take   neomycin-bacitracin-polymyxin Oint Commonly known as:  NEOSPORIN Apply 1 application topically 2 (two) times daily. To neck area and upper chest   oxybutynin 5 MG tablet Commonly known as:  DITROPAN Take 5 mg by mouth 2 (two) times daily as needed for bladder spasms.   polyethylene glycol packet Commonly known as:  MIRALAX / GLYCOLAX Take 17 g by mouth 2 (two) times daily.   pyrithione zinc 1 % shampoo Commonly known as:  HEAD AND SHOULDERS Apply topically every other day.      No Known Allergies Contact  information for after-discharge care    Destination    [name] .   Service:  Skilled Nursing Contact information: [address] [place] [phone]               The results of significant diagnostics from this hospitalization (including imaging, microbiology, ancillary and laboratory) are listed below for reference.    Significant  Diagnostic Studies: Ct Renal Stone Study  Result Date: 11/23/2017 CLINICAL DATA:  Flank and lower abdominal pain. Recurrent stone disease suspected. EXAM: CT ABDOMEN AND PELVIS WITHOUT CONTRAST TECHNIQUE: Multidetector CT imaging of the abdomen and pelvis was performed following the standard protocol without IV contrast. COMPARISON:  Most recent CT 09/11/2017 FINDINGS: Lower chest: Mild bibasilar atelectasis or scarring. No pleural fluid. Hepatobiliary: Few scattered subcentimeter hypodensities in the liver are too small to accurately characterize, likely cysts or hemangiomas. Gallbladder physiologically distended, no calcified stone. No biliary dilatation. Pancreas: No ductal dilatation or inflammation. Spleen: Normal in size without focal abnormality. Adrenals/Urinary Tract: Normal adrenal glands. Moderate right hydroureteronephrosis with ureteral thickening, similar to prior exam. Slight improvement in right periureteric fat stranding. Ureter is dilated to the bladder insertion. No obstructing stone. Punctate nonobstructing calculi in the right mid kidney. Small hyperdensity in the posterior right renal cortex is likely a hemorrhagic cyst. Left ureteral stent in place. Left hydronephrosis and parapelvic cysts are similar to prior exam, allowing for noncontrast appearance. Proximal pigtail is in the renal pelvis, distal pigtail in the urinary bladder. There is left ureteral thickening and periureteric stranding, which is mildly improved from prior. Small nonobstructing stones in the upper and lower left kidney. No stones or stone fragments along the course of the ureteral stent. Urinary bladder is decompressed around a suprapubic tube. No stranding about the catheter tract. Stomach/Bowel: Stomach is within normal limits. Appendix appears normal. No evidence of bowel wall thickening, distention, or inflammatory changes. Vascular/Lymphatic: Mild aortic atherosclerosis. Multiple prominent retroperitoneal lymph  nodes appear similar to prior exam. No progressive adenopathy. Reproductive: Lobular heterogeneous uterus with underlying fibroids. No suspicious adnexal mass. Other: No free air, free fluid, or intra-abdominal fluid collection. Tiny fat containing umbilical hernia. Musculoskeletal: There are no acute or suspicious osseous abnormalities. IMPRESSION: 1. Left ureteral stent in place. Persistent left hydronephrosis with ureteral thickening and perinephric stranding appears similar to exam last month. Previous stone in the distal ureter is no longer seen, and no stones along the course of the ureteral stent. 2. Moderate right hydronephrosis and ureteral thickening appears similar to prior exam, improvement in periureteric stranding. 3. Bilateral nonobstructing renal calculi. 4. Remainder the exam is unchanged. Aortic Atherosclerosis (ICD10-I70.0). Electronically Signed   By: Jeb Levering M.D.   On: 11/23/2017 21:25    Microbiology: Recent Results (from the past 240 hour(s))  Urine culture     Status: Abnormal   Collection Time: 11/23/17  9:48 PM  Result Value Ref Range Status   Specimen Description   Final    URINE, CATHETERIZED Performed at [facility] [address] [place], [place] [address]    Special Requests   Final    Normal Performed at [facility], [place] [address], [place], [place] [address]    Culture MULTIPLE SPECIES PRESENT, SUGGEST RECOLLECTION (A)  Final   Report Status 11/25/2017 FINAL  Final  MRSA PCR Screening     Status: None   Collection Time: 11/24/17  3:48 AM  Result Value Ref Range Status   MRSA by PCR  NEGATIVE NEGATIVE Final    Comment:        The GeneXpert MRSA Assay (FDA approved for NASAL specimens only), is one component of a comprehensive MRSA colonization surveillance program. It is not intended to diagnose MRSA infection nor to guide or monitor treatment for MRSA infections. Performed at [facility], [place] [address] [place], [place] [address]      Labs: Basic Metabolic Panel: Recent Labs  Lab 11/23/17 2148 11/25/17 0432 11/26/17 0442  NA 143 142 144  K 4.9 3.9 4.4  CL 108 114* 115*  CO2 25 24 23   GLUCOSE 170* 154* 141*  BUN 24* 17 15  CREATININE 0.95 0.77 0.71  CALCIUM 10.9* 9.5 9.2   Liver Function Tests: Recent Labs  Lab 11/23/17 2148  AST 20  ALT 17  ALKPHOS 76  BILITOT 0.7  PROT 9.0*  ALBUMIN 3.7   Recent Labs  Lab 11/23/17 2148  LIPASE 33   No results for input(s): AMMONIA in the last 168 hours. CBC: Recent Labs  Lab 11/23/17 2148 11/25/17 0432 11/26/17 0442 11/27/17 0712  WBC 12.6* 9.9 9.3 9.2  NEUTROABS 8.2* 3.9 3.3 4.0  HGB 10.9* 8.5* 7.4* 7.9*  HCT 34.4* 26.4* 23.6* 24.6*  MCV 93.5 92.0 91.5 92.5  PLT 571* 488* 450* 465*   Cardiac Enzymes: No results for input(s): CKTOTAL, CKMB, CKMBINDEX, TROPONINI in the last 168 hours. BNP: BNP (last 3 results) No results for input(s): BNP in the last 8760 hours.  ProBNP (last 3 results) No results for input(s): PROBNP in the last 8760 hours.  CBG: Recent Labs  Lab 11/26/17 1215 11/26/17 1617 11/26/17 2056 11/27/17 0745 11/27/17 1132  GLUCAP 188* 130* 282* 162* 184*       Signed:  Alma Friendly, MD [facility] 11/27/2017, 12:53 PM

## 2017-11-27 NOTE — Progress Notes (Signed)
Pt returning to [facility] where she is long term care resident- report [phone].  Provided DC information via the [facility]- pt to continue receiving IV merrem at facility x 3 days- facility reports peripheral administration acceptable.   Will arrange [facility] transportation.  Sharren Bridge, MSW, LCSW Clinical Social Work 11/27/2017 [phone]

## 2017-11-27 NOTE — Progress Notes (Signed)
Patient refusing to allow nurse to start new IV line until the morning.

## 2018-09-26 IMAGING — CT CT ABD-PELV W/O CM
3 of 6 series · 11 of 46 positions shown, 16 images · non-contrast
Comparison: 03/15/2016 and 03/10/2016.

CLINICAL DATA: Patient normally resides at [REDACTED] and was a past medical history of multiple sclerosis was
admitted on [DATE] with altered mental status and septic shock. It was
felt that her presentation was due to pelvic abscess versus UTI
versus decubitus ulcers. General surgery was consulted. CT scan was
repeated, which suggested that the abscess abuts the urinary
bladder. So urology was consulted as well. Patient underwent
cystoscopy and biopsies of the lesion identified in the urinary
bladder.

EXAM:
CT ABDOMEN AND PELVIS WITHOUT CONTRAST
TECHNIQUE: Multidetector CT imaging of the abdomen and pelvis was performed
following the standard protocol without IV contrast.

[Series 11: rtn a/p w/o · axial · non-contrast · 0.78mm/px · z∈[-416,-20]mm · 7 of 107 slices shown, 12 images]
[im 14/107  soft-tissue]
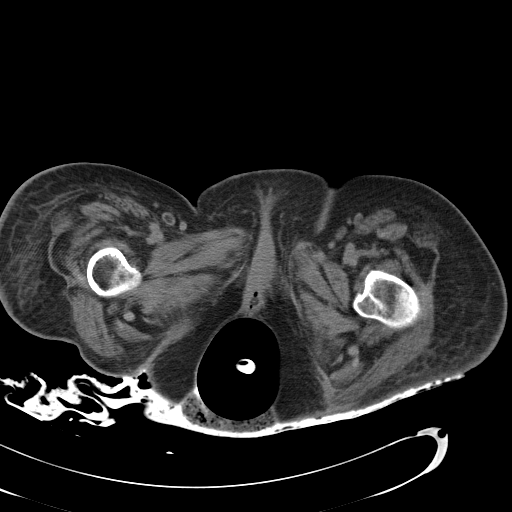
[im 14/107  bone]
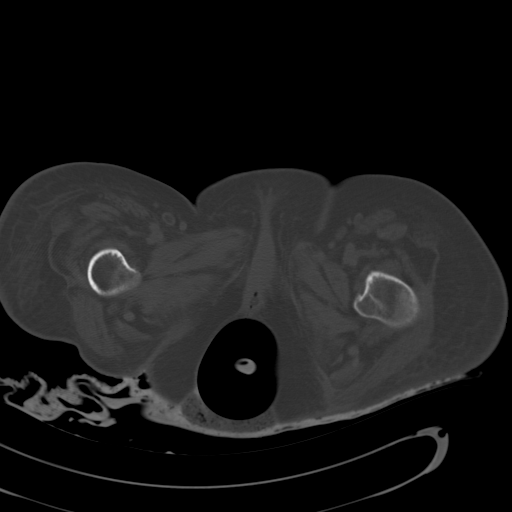
[im 27/107  soft-tissue]
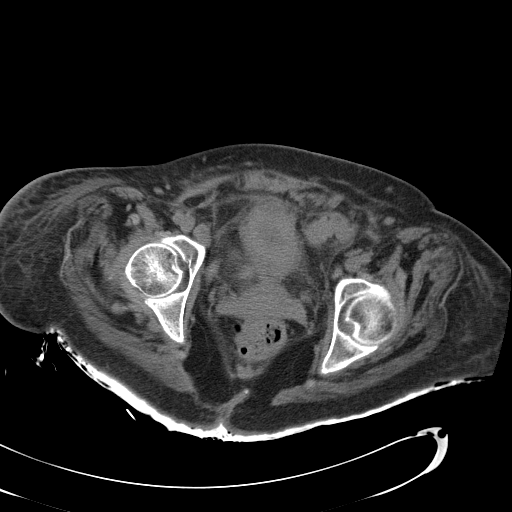
[im 40/107  soft-tissue]
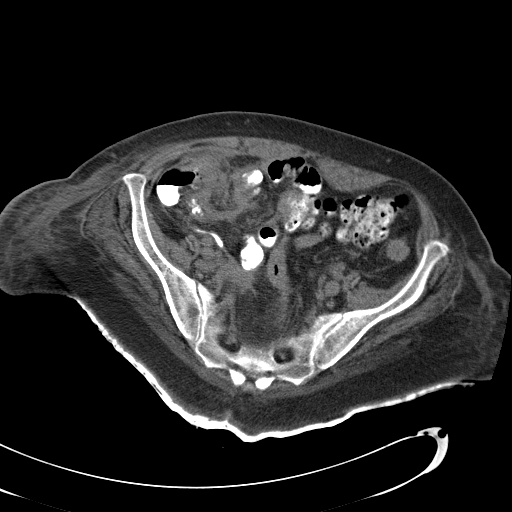
[im 54/107  soft-tissue]
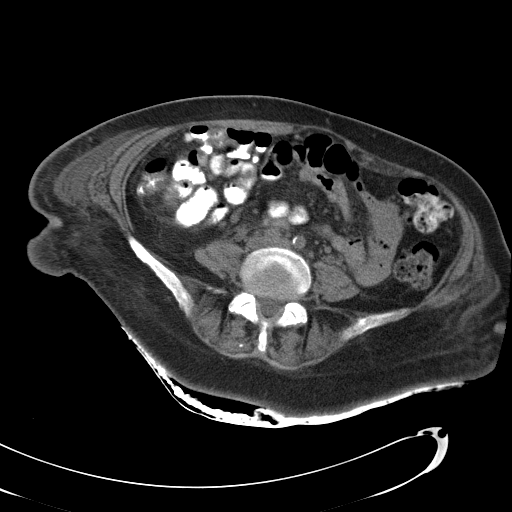
[im 54/107  lung]
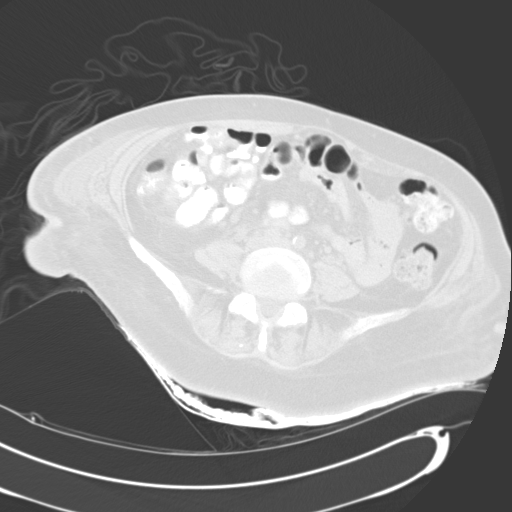
[im 67/107  soft-tissue]
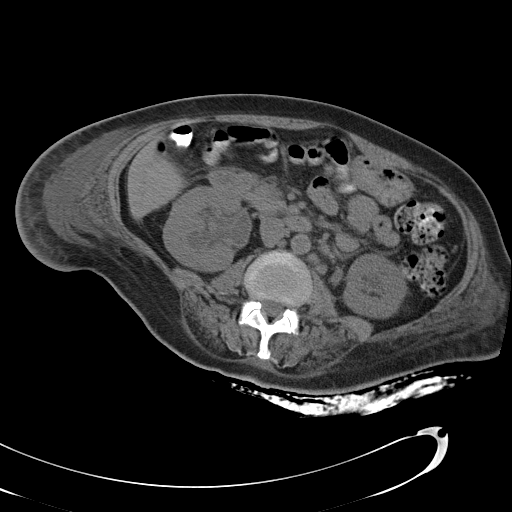
[im 67/107  lung]
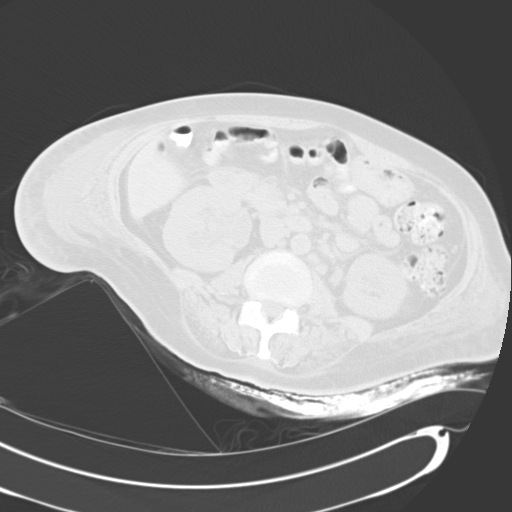
[im 80/107  soft-tissue]
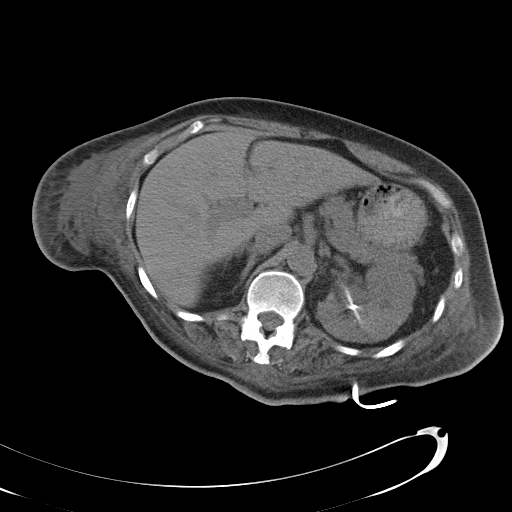
[im 80/107  lung]
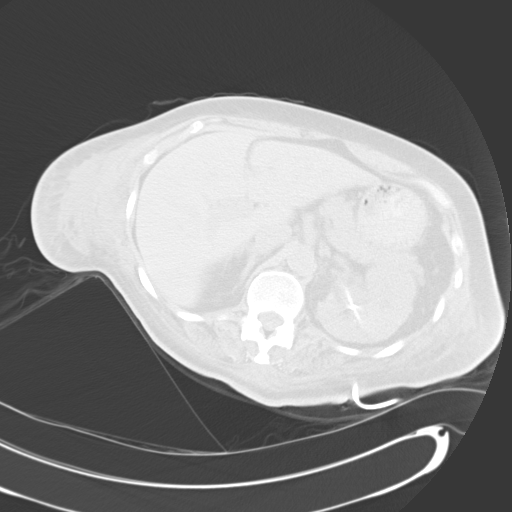
[im 93/107  soft-tissue]
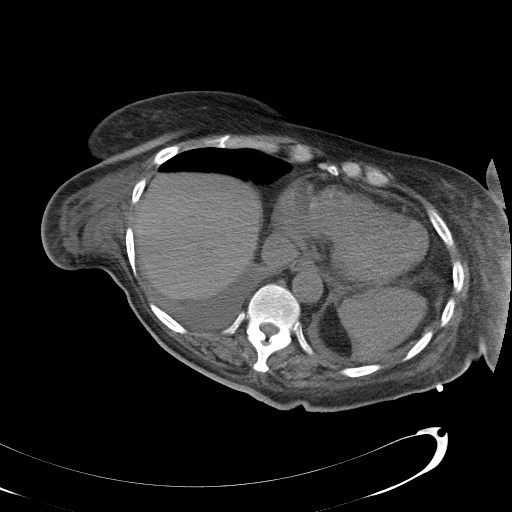
[im 93/107  lung]
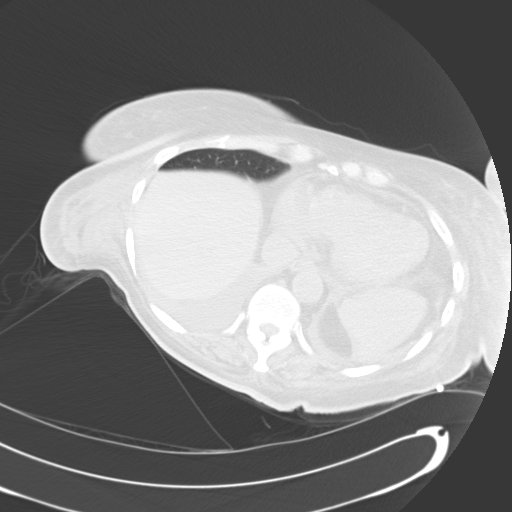

[Series 602: <mpr thick range> · coronal · 1.04mm/px · 3 of 144 slices shown]
[im 48/144  soft-tissue]
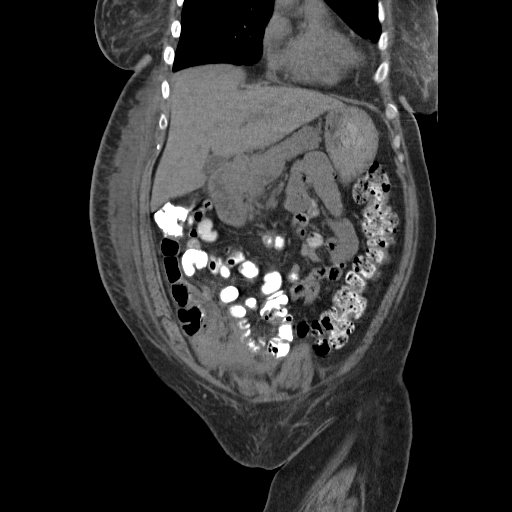
[im 64/144  soft-tissue]
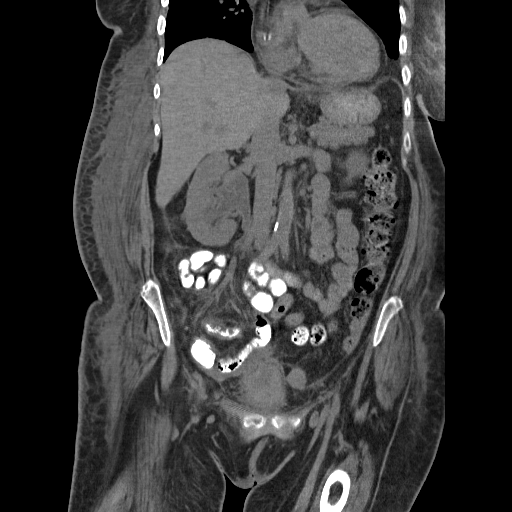
[im 80/144  soft-tissue]
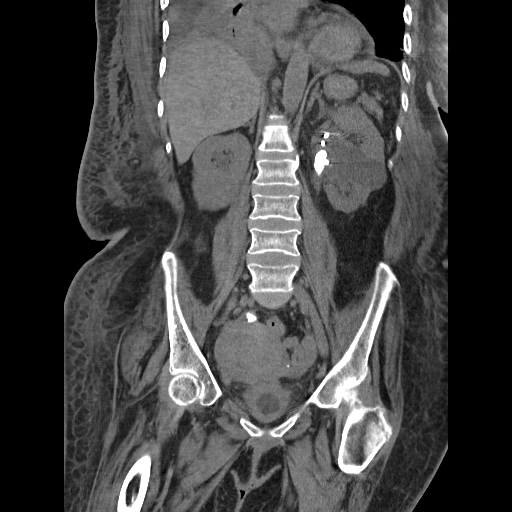

[Series 603: <mpr thick range(1)> · sagittal · 1.04mm/px · 1 of 199 slices shown]
[im 67/199  soft-tissue]
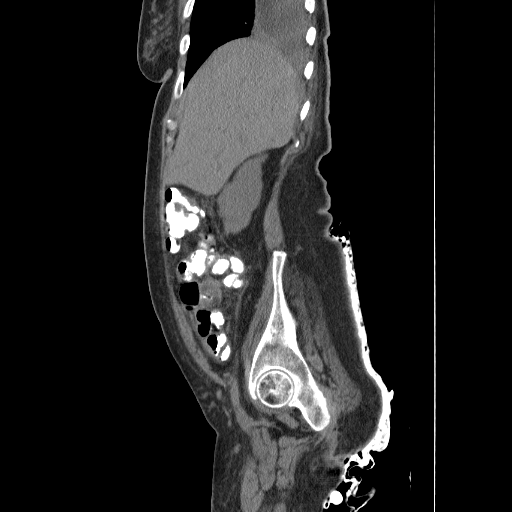

[11 of 46 positions shown; findings below may reference images not displayed]

FINDINGS: Lower chest: Moderate right and small left pleural effusions.
Dependent lower lobe opacity is noted consistent with atelectasis.
Trace pericardial effusion. No evidence of pulmonary edema.

Hepatobiliary: No focal liver abnormality is seen. No gallstones,
gallbladder wall thickening, or biliary dilatation.

Pancreas: Unremarkable. No pancreatic ductal dilatation or
surrounding inflammatory changes.

Spleen: Normal in size without focal abnormality.

Adrenals/Urinary Tract: No adrenal masses. Nephrostomy tube curls
within the left renal pelvis. There is mild left hydronephrosis.
Bilobed 5.6 cm left midpole renal cyst is stable from prior exams.
No other renal masses. There are intrarenal stones in the renal
pelvis largest measuring 2.4 cm. There is a stone in the left this
is stable from the prior exams. Distal ureter which measures 2 cm in
size. Mild right hydronephrosis. No right intrarenal stones.
Probable small cyst along the posterior margin of the right kidney,
stable. No right ureteral stone.

Bladder is decompressed with a Foley catheter. A single small bubble
of air projects along the peripheral margin of the left anterior
bladder wall. Focal inflammatory change seen contiguous with the
anterior superior bladder, which may reflect a poorly defined
abscess, similar when compared to the most recent prior exam.

Stomach/Bowel: There is wall thickening of the cecum and, to lesser
degree, the terminal ileum, with inflammatory type change extending
from the cecum towards the right anterior pelvis near the
inflammatory changes that lies above the bladder. A normal appendix
is visualized. No other colonic inflammatory process. Remainder of
the small bowel is unremarkable. No stomach abnormality.

Vascular/Lymphatic: Mild atherosclerotic calcifications along a
normal caliber abdominal aorta. Mildly enlarged right external iliac
chain lymph node measuring 13 mm in short axis. There are several
other prominent common and external iliac chain lymph nodes. There
are prominent to borderline enlarged retroperitoneal lymph nodes
most evident adjacent to the left kidney along the left margin of
the aorta.

Reproductive: Lobulated heterogeneous uterus consistent with
multiple fibroids. No adnexal masses.

Other: No abdominal wall hernia. There is subcutaneous edema most
evident adjacent to the right pelvis and hip.

There is a rectal type 2 that lies along the posterior margin of the
perineum, posterior to the rectum and anus. Contrast has been
injected, buckles along the posterior margin of the pelvis. This
does not enter the rectum.

Musculoskeletal: No bone resorption is seen to suggest
osteomyelitis. No significant bony abnormality.
IMPRESSION: 1. Ill-defined focal inflammatory change contiguous with the
anterior bladder dome is similar to the prior exam. No defined
abscess.
2. There is also a right lower quadrant inflammation extending from
the thick walled cecum. This may be the source of the inflammation.
A normal appendix is noted.
3. 2 cm stone in the distal left ureter, stable from the most recent
prior exam. Well-positioned left nephrostomy tube. Mild residual
left hydronephrosis.
4. Mild right hydronephrosis likely due to the right lower quadrant
inflammation or possibly X transit compression by the uterus, which
is mildly enlarged by apparent fibroids. The mild hydronephrosis is
similar to the prior exam.
5. Moderate right and small left pleural effusions with dependent
lower lobe atelectasis, increased when compared to the CT dated
03/10/2016.

## 2019-12-05 ENCOUNTER — Other Ambulatory Visit (HOSPITAL_COMMUNITY): Payer: Self-pay | Admitting: Urology

## 2019-12-05 DIAGNOSIS — N130 Hydronephrosis with ureteropelvic junction obstruction: Secondary | ICD-10-CM

## 2019-12-17 ENCOUNTER — Ambulatory Visit (HOSPITAL_COMMUNITY): Admission: RE | Admit: 2019-12-17 | Payer: Medicare Other | Source: Ambulatory Visit

## 2019-12-26 ENCOUNTER — Other Ambulatory Visit: Payer: Self-pay

## 2019-12-26 ENCOUNTER — Ambulatory Visit (HOSPITAL_COMMUNITY)
Admission: RE | Admit: 2019-12-26 | Discharge: 2019-12-26 | Disposition: A | Discharge status: Home / Self Care | Payer: Medicare Other | Source: Ambulatory Visit | Attending: Urology | Admitting: Urology

## 2019-12-26 DIAGNOSIS — N132 Hydronephrosis with renal and ureteral calculous obstruction: Secondary | ICD-10-CM | POA: Insufficient documentation

## 2019-12-26 DIAGNOSIS — N130 Hydronephrosis with ureteropelvic junction obstruction: Secondary | ICD-10-CM

## 2019-12-26 MED ORDER — IOHEXOL 300 MG/ML  SOLN
100.0000 mL | Freq: Once | INTRAMUSCULAR | Status: AC | PRN
  Administered 2019-12-26: 100 mL via INTRAVENOUS

## 2019-12-26 MED ORDER — SODIUM CHLORIDE (PF) 0.9 % IJ SOLN
INTRAMUSCULAR | Status: AC
  Filled 2019-12-26: qty 50

## 2021-03-07 ENCOUNTER — Emergency Department (HOSPITAL_COMMUNITY)
Admission: EM | Admit: 2021-03-07 | Discharge: 2021-03-07 | Disposition: A | Discharge status: Home / Self Care | Payer: Medicare Other | Attending: Emergency Medicine | Admitting: Emergency Medicine

## 2021-03-07 ENCOUNTER — Encounter (HOSPITAL_COMMUNITY): Payer: Self-pay

## 2021-03-07 DIAGNOSIS — Z87891 Personal history of nicotine dependence: Secondary | ICD-10-CM | POA: Diagnosis not present

## 2021-03-07 DIAGNOSIS — N183 Chronic kidney disease, stage 3 unspecified: Secondary | ICD-10-CM | POA: Diagnosis not present

## 2021-03-07 DIAGNOSIS — I129 Hypertensive chronic kidney disease with stage 1 through stage 4 chronic kidney disease, or unspecified chronic kidney disease: Secondary | ICD-10-CM | POA: Diagnosis not present

## 2021-03-07 DIAGNOSIS — N9489 Other specified conditions associated with female genital organs and menstrual cycle: Secondary | ICD-10-CM | POA: Diagnosis not present

## 2021-03-07 DIAGNOSIS — T83098A Other mechanical complication of other indwelling urethral catheter, initial encounter: Secondary | ICD-10-CM | POA: Diagnosis not present

## 2021-03-07 DIAGNOSIS — Z7984 Long term (current) use of oral hypoglycemic drugs: Secondary | ICD-10-CM | POA: Insufficient documentation

## 2021-03-07 DIAGNOSIS — E1140 Type 2 diabetes mellitus with diabetic neuropathy, unspecified: Secondary | ICD-10-CM | POA: Insufficient documentation

## 2021-03-07 DIAGNOSIS — T83010A Breakdown (mechanical) of cystostomy catheter, initial encounter: Secondary | ICD-10-CM

## 2021-03-07 NOTE — ED Notes (Signed)
Attempted to call report to facility w/o answer x1.

## 2021-03-07 NOTE — ED Provider Notes (Signed)
[facility] DEPT Provider Note   CSN: [id number] Arrival date & time: 03/07/21  1301     History Chief Complaint  Patient presents with   Dislodged Catheter    [name] is a 60 y.o. female.  [name] resents after her suprapubic catheter was dislodged at her skilled nursing facility.  She has no other complaints.  The history is provided by the patient and the EMS personnel.  Female GU Problem This is a new problem. The current episode started 1 to 2 hours ago. The problem occurs constantly. The problem has not changed since onset.Pertinent negatives include no chest pain, no abdominal pain, no headaches and no shortness of breath. Nothing aggravates the symptoms. Nothing relieves the symptoms. She has tried nothing for the symptoms. The treatment provided no relief.      Past Medical History:  Diagnosis Date   Acute parametritis and pelvic cellulitis    Acute renal failure superimposed on stage 3 chronic kidney disease ([place]) 07/21/2015   Anemia    chronic    Contracture, right hand    Diabetes mellitus without complication (HCC)    Diabetic neuropathy (HCC)    DVT (deep venous thrombosis) (HCC)    DVT (deep venous thrombosis) (HCC)    GERD (gastroesophageal reflux disease)    History of kidney stones    Hx of sepsis    Hyperlipidemia    Hypertension    Hypokalemia    Hypomagnesemia    Left nephrolithiasis    Leukocytosis    Lymph edema    chronic    Malnutrition of moderate degree 07/21/2015   Multiple sclerosis diagnosed 2002-not on Therapy any longer 06/26/2012   Muscle weakness (generalized)    Neuromuscular disorder (HCC)    Neuromuscular dysfunction of bladder    Polyneuropathy    Presence of indwelling urinary catheter    Protein calorie malnutrition ([place])    Stage 4 decubitus ulcer ([place]) 07/05/2015   Stroke ([place])    UTI (lower urinary tract infection)     Patient Active Problem List   Diagnosis Date Noted    Bilateral hydronephrosis 09/13/2017   Acute lower UTI 09/13/2017   UTI due to extended-spectrum beta lactamase (ESBL) producing Escherichia coli 09/12/2017   Renal calculus 08/15/2016   Kidney stone 03/55/9741   Acute metabolic encephalopathy 63/84/5364   Essential hypertension 07/19/2016   Hydronephrosis    Pelvic abscess in female 03/17/2016   Septic shock ([place]) 03/08/2016   Neuropathic pain 01/11/2016   Left nephrolithiasis 12/22/2015   Pyelonephritis    Pressure ulcer    Controlled diabetes mellitus type 2 with complications ([place])    AKI (acute kidney injury) ([place]) 11/11/2015   Neurogenic bladder 11/11/2015   Physical deconditioning 11/11/2015   Chronic indwelling Foley catheter 09/30/2015   Dyslipidemia associated with type 2 diabetes mellitus ([place]) 08/28/2015   GERD without esophagitis 08/28/2015   Anemia 07/21/2015   Malnutrition of moderate degree 07/21/2015   DVT, lower extremity ([place]) 12/28/2014   Multiple sclerosis diagnosed 2002-not on Therapy any longer 06/26/2012    Past Surgical History:  Procedure Laterality Date   COLONOSCOPY WITH PROPOFOL N/A 07/25/2016   Procedure: COLONOSCOPY WITH PROPOFOL;  Surgeon: Laurence Spates, MD;  [facility];  Service: Endoscopy;  Laterality: N/A;   CYSTOSCOPY W/ RETROGRADES Bilateral 09/18/2017   Procedure: CYSTOSCOPY WITH RETROGRADE PYELOGRAM;  Surgeon: Franchot Gallo, MD;  [facility];  Service: Urology;  Laterality: Bilateral;   CYSTOSCOPY/URETEROSCOPY/HOLMIUM LASER/STENT PLACEMENT Left 09/18/2017  Procedure: CYSTOSCOPY/LEFTURETEROSCOPY/URETERAL STENT PLACEMENT STONE EXTRACTION;  Surgeon: Franchot Gallo, MD;  [facility];  Service: Urology;  Laterality: Left;   ESOPHAGOGASTRODUODENOSCOPY Left 01/02/2015   Procedure: ESOPHAGOGASTRODUODENOSCOPY (EGD);  Surgeon: Arta Silence, MD;  Location: [facility];  Service: Endoscopy;  Laterality: Left;   ESOPHAGOGASTRODUODENOSCOPY (EGD) WITH PROPOFOL N/A 07/25/2016    Procedure: ESOPHAGOGASTRODUODENOSCOPY (EGD) WITH PROPOFOL;  Surgeon: Laurence Spates, MD;  [facility];  Service: Endoscopy;  Laterality: N/A;   HERNIA MESH REMOVAL     HOLMIUM LASER APPLICATION Left 11/14/1581   Procedure: HOLMIUM LASER APPLICATION;  Surgeon: Franchot Gallo, MD;  [facility];  Service: Urology;  Laterality: Left;   IR CATHETER TUBE CHANGE  10/06/2016   IR CATHETER TUBE CHANGE  11/17/2016   IR CATHETER TUBE CHANGE  02/10/2017   IR GENERIC HISTORICAL  02/23/2016   IR NEPHROSTOMY EXCHANGE LEFT 02/23/2016 Sandi Mariscal, MD [facility]   IR GENERIC HISTORICAL  04/19/2016   IR NEPHROSTOMY EXCHANGE LEFT 04/19/2016 Arne Cleveland, MD [facility]   IR GENERIC HISTORICAL  05/13/2016   IR NEPHROSTOMY EXCHANGE LEFT 05/13/2016 Corrie Mckusick, DO [facility]   IR GENERIC HISTORICAL  05/13/2016   IR NEPHROSTOMY PLACEMENT RIGHT 05/13/2016 Corrie Mckusick, DO [facility]   IR GENERIC HISTORICAL  07/07/2016   IR NEPHROSTOMY EXCHANGE RIGHT 07/07/2016 Jacqulynn Cadet, MD [facility]   IR GENERIC HISTORICAL  07/07/2016   IR NEPHROSTOMY EXCHANGE LEFT 07/07/2016 Jacqulynn Cadet, MD [facility]   IR NEPHROSTOGRAM LEFT THRU EXISTING ACCESS  12/28/2016   IR NEPHROSTOGRAM RIGHT THRU EXISTING ACCESS  12/28/2016   IR NEPHROSTOMY EXCHANGE LEFT  10/06/2016   IR NEPHROSTOMY EXCHANGE LEFT  11/17/2016   IR NEPHROSTOMY EXCHANGE RIGHT  10/06/2016   IR NEPHROSTOMY EXCHANGE RIGHT  11/17/2016   NEPHROLITHOTOMY Left 08/18/2016   Procedure: NEPHROLITHOTOMY LEFT PERCUTANEOUS;  Surgeon: Franchot Gallo, MD;  [facility];  Service: Urology;  Laterality: Left;   NEPHROSTOMY TUBE PLACEMENT ([place] HX)     TRANSURETHRAL RESECTION OF BLADDER TUMOR N/A 03/16/2016   Procedure: CYSTOSCOPY BLADDER BIOPSY;  Surgeon: Franchot Gallo, MD;  [facility];  Service: Urology;  Laterality: N/A;   TUBAL LIGATION     tubes tided       OB History   No obstetric history on file.     Family History  Problem Relation  Age of Onset   Diabetes Mother    Alzheimer's disease Mother    Diabetes Father    Diabetes Sister    Scoliosis Sister     Social History   Tobacco Use   Smoking status: Former    Packs/day: 1.00    Years: 20.00    Pack years: 20.00    Types: Cigarettes    Quit date: 11/19/2014    Years since quitting: 6.3   Smokeless tobacco: Never  Vaping Use   Vaping Use: Never used  Substance Use Topics   Alcohol use: Not Currently    Alcohol/week: 5.0 standard drinks    Types: 5 Glasses of wine per week   Drug use: No    Home Medications Prior to Admission medications   Medication Sig Start Date End Date Taking? Authorizing Provider  acetaminophen (TYLENOL) 325 MG tablet Take 2 tablets (650 mg total) by mouth every 6 (six) hours as needed for fever. 03/20/16   Bonnielee Haff, MD  albuterol (PROVENTIL) (2.5 MG/3ML) 0.083% nebulizer solution Take 3 mLs (2.5 mg total) by nebulization every 6 (six) hours as needed for wheezing or shortness of  breath. 12/30/15   Robbie Lis, MD  atorvastatin (LIPITOR) 10 MG tablet Take 10 mg by mouth at bedtime.     [provider]  clindamycin (CLEOCIN T) 1 % external solution Apply 1 application topically daily.    [provider]  doxycycline (VIBRA-TABS) 100 MG tablet Take 100 mg by mouth daily.    [provider]  famotidine (PEPCID) 20 MG tablet Take 1 tablet (20 mg total) by mouth 2 (two) times daily. 11/18/15   Cherene Altes, MD  ferrous sulfate 325 (65 FE) MG EC tablet Take 325 mg by mouth 2 (two) times daily.    [provider]  gabapentin (NEURONTIN) 100 MG capsule Take 1 capsule (100 mg total) by mouth at bedtime. 11/18/15   Cherene Altes, MD  glipiZIDE (GLUCOTROL) 2.5 mg TABS tablet Take 5 mg by mouth daily before breakfast.     [provider]  HYDROcodone-acetaminophen (NORCO/VICODIN) 5-325 MG tablet Take 1 tablet by mouth every 6 (six) hours as needed for moderate pain.    [provider]  metFORMIN (GLUCOPHAGE) 500 MG tablet Take 0.5 tablets (250 mg total) by mouth 2 (two) times daily. Patient taking differently: Take 500 mg by mouth 2 (two) times daily.  04/22/16   Ngetich, Dinah C, NP  neomycin-bacitracin-polymyxin (NEOSPORIN) OINT Apply 1 application topically 2 (two) times daily. To neck area and upper chest 09/19/17   Bonnielee Haff, MD  oxybutynin (DITROPAN) 5 MG tablet Take 5 mg by mouth 2 (two) times daily as needed for bladder spasms.    [provider]  polyethylene glycol (MIRALAX / GLYCOLAX) packet Take 17 g by mouth 2 (two) times daily. 11/18/15   Cherene Altes, MD  pyrithione zinc (HEAD AND SHOULDERS) 1 % shampoo Apply topically every other day.    [provider]    Allergies    Patient has no known allergies.  Review of Systems   Review of Systems  Constitutional:  Negative for chills and fever.  HENT:  Negative for ear pain and sore throat.   Eyes:  Negative for pain and visual disturbance.  Respiratory:  Negative for cough and shortness of breath.   Cardiovascular:  Negative for chest pain and palpitations.  Gastrointestinal:  Negative for abdominal pain and vomiting.  Genitourinary:  Negative for dysuria and hematuria.  Musculoskeletal:  Negative for arthralgias and back pain.  Skin:  Negative for color change and rash.  Neurological:  Negative for seizures, syncope and headaches.  All other systems reviewed and are negative.  Physical Exam Updated Vital Signs BP 137/79   Pulse 93   Temp 98.2 F (36.8 C) (Oral)   Resp 18   SpO2 100%   Physical Exam Vitals and nursing note reviewed.  HENT:     Head: Normocephalic and atraumatic.  Eyes:     General: No scleral icterus. Pulmonary:     Effort: Pulmonary effort is normal. No respiratory distress.  Abdominal:     Comments: Stoma site for the suprapubic catheter has some blood around it.  No abdominal tenderness to palpation  Musculoskeletal:     Cervical back: Normal  range of motion.  Skin:    General: Skin is warm and dry.  Neurological:     Mental Status: She is alert and oriented to person, place, and time. Mental status is at baseline.  Psychiatric:        Mood and Affect: Mood normal.    ED Results / Procedures /  Treatments   Labs (all labs ordered are listed, but only abnormal results are displayed) Labs Reviewed - No data to display  EKG None  Radiology No results found.  Procedures BLADDER CATHETERIZATION  Date/Time: 03/07/2021 2:59 PM Performed by: Arnaldo Natal, MD Authorized by: Arnaldo Natal, MD   Consent:    Consent obtained:  Verbal   Consent given by:  Patient   Risks, benefits, and alternatives were discussed: yes     Risks discussed:  False passage, infection, incomplete procedure and pain   Alternatives discussed:  No treatment, delayed treatment, alternative treatment, observation and referral Universal protocol:    Immediately prior to procedure, a time out was called: yes     Patient identity confirmed:  Verbally with patient Pre-procedure details:    Procedure purpose:  Therapeutic   Preparation: Patient was prepped and draped in usual sterile fashion   Anesthesia:    Anesthesia method:  None Procedure details:    Provider performed due to:  Complicated insertion   Catheter insertion:  Indwelling   Catheter type: suprapubic.   Catheter size:  20 Fr   Bladder irrigation: no     Number of attempts:  1   Urine characteristics:  Yellow Post-procedure details:    Procedure completion:  Tolerated well, no immediate complications   Medications Ordered in ED Medications - No data to display  ED Course  I have reviewed the triage vital signs and the nursing notes.  Pertinent labs & imaging results that were available during my care of the patient were reviewed by me and considered in my medical decision making (see chart for details).    MDM Rules/Calculators/A&P                           [name] presented after her suprapubic catheter was dislodged.  This was replaced uneventfully.  No other symptoms to suggest further evaluation is needed Final Clinical Impression(s) / ED Diagnoses Final diagnoses:  Suprapubic catheter dysfunction, initial encounter [facility])    Rx / DC Orders ED Discharge Orders     None        Arnaldo Natal, MD 03/07/21 1501

## 2021-03-07 NOTE — ED Notes (Signed)
[facility] called for transport.  

## 2021-03-07 NOTE — ED Triage Notes (Signed)
Per EMS, Pt, from [facility], presents w/ dislodged suprapubic catheter.  Facility reported they tried to fix it w/o success.  Pt has no complaints.

## 2021-05-16 ENCOUNTER — Other Ambulatory Visit: Payer: Self-pay

## 2021-05-16 ENCOUNTER — Emergency Department (HOSPITAL_COMMUNITY)
Admission: EM | Admit: 2021-05-16 | Discharge: 2021-05-17 | Disposition: A | Discharge status: Home / Self Care | Payer: Medicare Other | Attending: Emergency Medicine | Admitting: Emergency Medicine

## 2021-05-16 ENCOUNTER — Encounter (HOSPITAL_COMMUNITY): Payer: Self-pay | Admitting: Emergency Medicine

## 2021-05-16 DIAGNOSIS — I10 Essential (primary) hypertension: Secondary | ICD-10-CM | POA: Diagnosis not present

## 2021-05-16 DIAGNOSIS — Z7984 Long term (current) use of oral hypoglycemic drugs: Secondary | ICD-10-CM | POA: Diagnosis not present

## 2021-05-16 DIAGNOSIS — Y828 Other medical devices associated with adverse incidents: Secondary | ICD-10-CM | POA: Diagnosis not present

## 2021-05-16 DIAGNOSIS — Z87891 Personal history of nicotine dependence: Secondary | ICD-10-CM | POA: Diagnosis not present

## 2021-05-16 DIAGNOSIS — T83010A Breakdown (mechanical) of cystostomy catheter, initial encounter: Secondary | ICD-10-CM

## 2021-05-16 DIAGNOSIS — E119 Type 2 diabetes mellitus without complications: Secondary | ICD-10-CM | POA: Insufficient documentation

## 2021-05-16 DIAGNOSIS — T83090A Other mechanical complication of cystostomy catheter, initial encounter: Secondary | ICD-10-CM | POA: Insufficient documentation

## 2021-05-16 NOTE — ED Notes (Signed)
Patient is currently on the list for transport that was sent over from [place].

## 2021-05-16 NOTE — ED Provider Notes (Signed)
[facility] Provider Note   CSN: [id number] Arrival date & time: 05/16/21  1457     History No chief complaint on file.   [name] is a 60 y.o. female.  HPI Patient presents from nursing facility with staff concern of dislodged suprapubic catheter.  Patient has multiple medical issues, is bedbound, has multiple sclerosis, is oriented x4, but difficulty with speech.  Patient has no other complaints, seemingly.  No report of other concerns from nursing facility.     Past Medical History:  Diagnosis Date   Acute parametritis and pelvic cellulitis    Acute renal failure superimposed on stage 3 chronic kidney disease ([place]) 07/21/2015   Anemia    chronic    Contracture, right hand    Diabetes mellitus without complication (HCC)    Diabetic neuropathy (HCC)    DVT (deep venous thrombosis) (HCC)    DVT (deep venous thrombosis) (HCC)    GERD (gastroesophageal reflux disease)    History of kidney stones    Hx of sepsis    Hyperlipidemia    Hypertension    Hypokalemia    Hypomagnesemia    Left nephrolithiasis    Leukocytosis    Lymph edema    chronic    Malnutrition of moderate degree 07/21/2015   Multiple sclerosis diagnosed 2002-not on Therapy any longer 06/26/2012   Muscle weakness (generalized)    Neuromuscular disorder (HCC)    Neuromuscular dysfunction of bladder    Polyneuropathy    Presence of indwelling urinary catheter    Protein calorie malnutrition ([place])    Stage 4 decubitus ulcer ([place]) 07/05/2015   Stroke ([place])    UTI (lower urinary tract infection)     Patient Active Problem List   Diagnosis Date Noted   Bilateral hydronephrosis 09/13/2017   Acute lower UTI 09/13/2017   UTI due to extended-spectrum beta lactamase (ESBL) producing Escherichia coli 09/12/2017   Renal calculus 08/15/2016   Kidney stone 56/81/2751   Acute metabolic encephalopathy 70/06/7492   Essential hypertension 07/19/2016   Hydronephrosis     Pelvic abscess in female 03/17/2016   Septic shock ([place]) 03/08/2016   Neuropathic pain 01/11/2016   Left nephrolithiasis 12/22/2015   Pyelonephritis    Pressure ulcer    Controlled diabetes mellitus type 2 with complications ([place])    AKI (acute kidney injury) ([place]) 11/11/2015   Neurogenic bladder 11/11/2015   Physical deconditioning 11/11/2015   Chronic indwelling Foley catheter 09/30/2015   Dyslipidemia associated with type 2 diabetes mellitus ([place]) 08/28/2015   GERD without esophagitis 08/28/2015   Anemia 07/21/2015   Malnutrition of moderate degree 07/21/2015   DVT, lower extremity ([place]) 12/28/2014   Multiple sclerosis diagnosed 2002-not on Therapy any longer 06/26/2012    Past Surgical History:  Procedure Laterality Date   COLONOSCOPY WITH PROPOFOL N/A 07/25/2016   Procedure: COLONOSCOPY WITH PROPOFOL;  Surgeon: Laurence Spates, MD;  [facility];  Service: Endoscopy;  Laterality: N/A;   CYSTOSCOPY W/ RETROGRADES Bilateral 09/18/2017   Procedure: CYSTOSCOPY WITH RETROGRADE PYELOGRAM;  Surgeon: Franchot Gallo, MD;  [facility];  Service: Urology;  Laterality: Bilateral;   CYSTOSCOPY/URETEROSCOPY/HOLMIUM LASER/STENT PLACEMENT Left 09/18/2017   Procedure: CYSTOSCOPY/LEFTURETEROSCOPY/URETERAL STENT PLACEMENT STONE EXTRACTION;  Surgeon: Franchot Gallo, MD;  [facility];  Service: Urology;  Laterality: Left;   ESOPHAGOGASTRODUODENOSCOPY Left 01/02/2015   Procedure: ESOPHAGOGASTRODUODENOSCOPY (EGD);  Surgeon: Arta Silence, MD;  Location: [facility];  Service: Endoscopy;  Laterality: Left;   ESOPHAGOGASTRODUODENOSCOPY (EGD) WITH PROPOFOL N/A 07/25/2016  Procedure: ESOPHAGOGASTRODUODENOSCOPY (EGD) WITH PROPOFOL;  Surgeon: Laurence Spates, MD;  [facility];  Service: Endoscopy;  Laterality: N/A;   HERNIA MESH REMOVAL     HOLMIUM LASER APPLICATION Left 10/14/2704   Procedure: HOLMIUM LASER APPLICATION;  Surgeon: Franchot Gallo, MD;  [facility];   Service: Urology;  Laterality: Left;   IR CATHETER TUBE CHANGE  10/06/2016   IR CATHETER TUBE CHANGE  11/17/2016   IR CATHETER TUBE CHANGE  02/10/2017   IR GENERIC HISTORICAL  02/23/2016   IR NEPHROSTOMY EXCHANGE LEFT 02/23/2016 Sandi Mariscal, MD [facility]   IR GENERIC HISTORICAL  04/19/2016   IR NEPHROSTOMY EXCHANGE LEFT 04/19/2016 Arne Cleveland, MD [facility]   IR GENERIC HISTORICAL  05/13/2016   IR NEPHROSTOMY EXCHANGE LEFT 05/13/2016 Corrie Mckusick, DO [facility]   IR GENERIC HISTORICAL  05/13/2016   IR NEPHROSTOMY PLACEMENT RIGHT 05/13/2016 Corrie Mckusick, DO [facility]   IR GENERIC HISTORICAL  07/07/2016   IR NEPHROSTOMY EXCHANGE RIGHT 07/07/2016 Jacqulynn Cadet, MD [facility]   IR GENERIC HISTORICAL  07/07/2016   IR NEPHROSTOMY EXCHANGE LEFT 07/07/2016 Jacqulynn Cadet, MD [facility]   IR NEPHROSTOGRAM LEFT THRU EXISTING ACCESS  12/28/2016   IR NEPHROSTOGRAM RIGHT THRU EXISTING ACCESS  12/28/2016   IR NEPHROSTOMY EXCHANGE LEFT  10/06/2016   IR NEPHROSTOMY EXCHANGE LEFT  11/17/2016   IR NEPHROSTOMY EXCHANGE RIGHT  10/06/2016   IR NEPHROSTOMY EXCHANGE RIGHT  11/17/2016   NEPHROLITHOTOMY Left 08/18/2016   Procedure: NEPHROLITHOTOMY LEFT PERCUTANEOUS;  Surgeon: Franchot Gallo, MD;  [facility];  Service: Urology;  Laterality: Left;   NEPHROSTOMY TUBE PLACEMENT ([place] HX)     TRANSURETHRAL RESECTION OF BLADDER TUMOR N/A 03/16/2016   Procedure: CYSTOSCOPY BLADDER BIOPSY;  Surgeon: Franchot Gallo, MD;  [facility];  Service: Urology;  Laterality: N/A;   TUBAL LIGATION     tubes tided       OB History   No obstetric history on file.     Family History  Problem Relation Age of Onset   Diabetes Mother    Alzheimer's disease Mother    Diabetes Father    Diabetes Sister    Scoliosis Sister     Social History   Tobacco Use   Smoking status: Former    Packs/day: 1.00    Years: 20.00    Pack years: 20.00    Types: Cigarettes    Quit date: 11/19/2014    Years since  quitting: 6.4   Smokeless tobacco: Never  Vaping Use   Vaping Use: Never used  Substance Use Topics   Alcohol use: Not Currently    Alcohol/week: 5.0 standard drinks    Types: 5 Glasses of wine per week   Drug use: No    Home Medications Prior to Admission medications   Medication Sig Start Date End Date Taking? Authorizing Provider  acetaminophen (TYLENOL) 325 MG tablet Take 2 tablets (650 mg total) by mouth every 6 (six) hours as needed for fever. 03/20/16   Bonnielee Haff, MD  albuterol (PROVENTIL) (2.5 MG/3ML) 0.083% nebulizer solution Take 3 mLs (2.5 mg total) by nebulization every 6 (six) hours as needed for wheezing or shortness of breath. 12/30/15   Robbie Lis, MD  atorvastatin (LIPITOR) 10 MG tablet Take 10 mg by mouth at bedtime.     [provider]  clindamycin (CLEOCIN T) 1 % external solution Apply 1 application topically daily.    [provider]  doxycycline (VIBRA-TABS) 100 MG tablet Take 100 mg  by mouth daily.    [provider]  famotidine (PEPCID) 20 MG tablet Take 1 tablet (20 mg total) by mouth 2 (two) times daily. 11/18/15   Cherene Altes, MD  ferrous sulfate 325 (65 FE) MG EC tablet Take 325 mg by mouth 2 (two) times daily.    [provider]  gabapentin (NEURONTIN) 100 MG capsule Take 1 capsule (100 mg total) by mouth at bedtime. 11/18/15   Cherene Altes, MD  glipiZIDE (GLUCOTROL) 2.5 mg TABS tablet Take 5 mg by mouth daily before breakfast.     [provider]  HYDROcodone-acetaminophen (NORCO/VICODIN) 5-325 MG tablet Take 1 tablet by mouth every 6 (six) hours as needed for moderate pain.    [provider]  metFORMIN (GLUCOPHAGE) 500 MG tablet Take 0.5 tablets (250 mg total) by mouth 2 (two) times daily. Patient taking differently: Take 500 mg by mouth 2 (two) times daily.  04/22/16   Ngetich, Dinah C, NP  neomycin-bacitracin-polymyxin (NEOSPORIN) OINT Apply 1 application topically 2 (two) times daily.  To neck area and upper chest 09/19/17   Bonnielee Haff, MD  oxybutynin (DITROPAN) 5 MG tablet Take 5 mg by mouth 2 (two) times daily as needed for bladder spasms.    [provider]  polyethylene glycol (MIRALAX / GLYCOLAX) packet Take 17 g by mouth 2 (two) times daily. 11/18/15   Cherene Altes, MD  pyrithione zinc (HEAD AND SHOULDERS) 1 % shampoo Apply topically every other day.    [provider]    Allergies    Patient has no known allergies.  Review of Systems   Review of Systems  Unable to perform ROS: Patient nonverbal   Physical Exam Updated Vital Signs BP 112/75   Pulse (!) 122   Temp 98.6 F (37 C) (Oral)   Resp (!) 22   SpO2 96%   Physical Exam Vitals and nursing note reviewed.  Constitutional:      General: She is not in acute distress.    Appearance: She is well-developed. She is obese. She is not ill-appearing, toxic-appearing or diaphoretic.  HENT:     Head: Normocephalic and atraumatic.  Eyes:     Conjunctiva/sclera: Conjunctivae normal.  Cardiovascular:     Rate and Rhythm: Normal rate and regular rhythm.     Comments: Patient has episodes of tachycardia, but on monitor largely has sinus rhythm 90s, sinus tach, 100s Pulmonary:     Effort: Pulmonary effort is normal. No respiratory distress.     Breath sounds: Normal breath sounds. No stridor.  Abdominal:     General: There is no distension.     Tenderness: There is no abdominal tenderness. There is no guarding.     Comments: Suprapubic catheter with some urine around the exit site no erythema, no purulence, no bleeding.  Skin:    General: Skin is warm and dry.  Neurological:     Mental Status: She is alert.     Comments: Patient with diffuse atrophy, minimal motion of any extremity, but no facial asymmetry.  There is occasional phonation, without consistent speech.  Psychiatric:     Comments: Withdrawn    ED Results / Procedures / Treatments   Labs (all labs ordered are listed,  but only abnormal results are displayed) Labs Reviewed - No data to display  EKG None  Radiology No results found.  Procedures SUPRAPUBIC TUBE PLACEMENT  Date/Time: 05/16/2021 7:00 PM Performed by: Carmin Muskrat, MD Authorized by: Carmin Muskrat, MD  Consent:    Consent obtained:  Emergent situation   Consent given by:  Patient   Risks, benefits, and alternatives were discussed: yes     Risks discussed:  Infection and pain   Alternatives discussed:  Alternative treatment Sedation:    Sedation type:  None Anesthesia:    Anesthesia method:  None Procedure details:    Complexity:  Simple   Catheter type:  Foley   Catheter size:  20 Fr   Ultrasound guidance: no     Number of attempts:  1   Urine characteristics:  Yellow Post-procedure details:    Procedure completion:  Tolerated   Medications Ordered in ED Medications - No data to display  ED Course  I have reviewed the triage vital signs and the nursing notes.  Pertinent labs & imaging results that were available during my care of the patient were reviewed by me and considered in my medical decision making (see chart for details).   8:16 PM Patient in no distress, Foley catheter has been replaced, vital signs remain notable for for mild tachycardia, she is afebrile, no hypotension no hypoxia. MDM Rules/Calculators/A&P Adult female, bedbound with chronic suprapubic catheter presents after concern of dislodgment.  Here we had successful replacement of the catheter with production of clear/yellow urine, no malodorous findings, no fever, no hypotension.  Patient discharged to a nursing facility stable condition. Final Clinical Impression(s) / ED Diagnoses Final diagnoses:  Suprapubic catheter dysfunction, initial encounter [facility])    Rx / DC Orders ED Discharge Orders     None        Carmin Muskrat, MD 05/16/21 2016

## 2021-05-16 NOTE — ED Triage Notes (Addendum)
Pt to triage via [place] from [facility].  Pt has a suprapubic catheter that is dislodged with urine coming out around it.  Warm to touch.  Pt is alert and oriented x 4.  Pt triaged and then to green zone.

## 2021-05-16 NOTE — Discharge Instructions (Signed)
As discussed, further evaluation of your catheter should be considered, discussed with our urology colleagues.  Return here for concerning changes in your condition.

## 2021-05-16 NOTE — ED Notes (Signed)
New dressing placed on suprapubic catheter

## 2021-05-17 NOTE — ED Notes (Signed)
PTAR on unit to transport pt back to facility.  

## 2021-07-08 ENCOUNTER — Inpatient Hospital Stay (HOSPITAL_COMMUNITY)
Admission: EM | Admit: 2021-07-08 | Discharge: 2021-07-12 | DRG: 699 | Disposition: A | Discharge status: Skilled Nursing Facility | Payer: Medicare Other | Source: Skilled Nursing Facility | Attending: Student | Admitting: Student

## 2021-07-08 ENCOUNTER — Emergency Department (HOSPITAL_COMMUNITY): Payer: Medicare Other

## 2021-07-08 DIAGNOSIS — Z86718 Personal history of other venous thrombosis and embolism: Secondary | ICD-10-CM

## 2021-07-08 DIAGNOSIS — R532 Functional quadriplegia: Secondary | ICD-10-CM | POA: Diagnosis not present

## 2021-07-08 DIAGNOSIS — R1030 Lower abdominal pain, unspecified: Secondary | ICD-10-CM

## 2021-07-08 DIAGNOSIS — N319 Neuromuscular dysfunction of bladder, unspecified: Secondary | ICD-10-CM | POA: Diagnosis present

## 2021-07-08 DIAGNOSIS — R5381 Other malaise: Secondary | ICD-10-CM | POA: Diagnosis not present

## 2021-07-08 DIAGNOSIS — Z87440 Personal history of urinary (tract) infections: Secondary | ICD-10-CM | POA: Diagnosis not present

## 2021-07-08 DIAGNOSIS — E1165 Type 2 diabetes mellitus with hyperglycemia: Secondary | ICD-10-CM | POA: Diagnosis not present

## 2021-07-08 DIAGNOSIS — Z79899 Other long term (current) drug therapy: Secondary | ICD-10-CM

## 2021-07-08 DIAGNOSIS — N136 Pyonephrosis: Secondary | ICD-10-CM | POA: Diagnosis present

## 2021-07-08 DIAGNOSIS — F444 Conversion disorder with motor symptom or deficit: Secondary | ICD-10-CM | POA: Diagnosis present

## 2021-07-08 DIAGNOSIS — Z820 Family history of epilepsy and other diseases of the nervous system: Secondary | ICD-10-CM | POA: Diagnosis not present

## 2021-07-08 DIAGNOSIS — E1140 Type 2 diabetes mellitus with diabetic neuropathy, unspecified: Secondary | ICD-10-CM | POA: Diagnosis present

## 2021-07-08 DIAGNOSIS — Z9359 Other cystostomy status: Secondary | ICD-10-CM | POA: Diagnosis not present

## 2021-07-08 DIAGNOSIS — Z7984 Long term (current) use of oral hypoglycemic drugs: Secondary | ICD-10-CM | POA: Diagnosis not present

## 2021-07-08 DIAGNOSIS — K5900 Constipation, unspecified: Secondary | ICD-10-CM | POA: Diagnosis not present

## 2021-07-08 DIAGNOSIS — K219 Gastro-esophageal reflux disease without esophagitis: Secondary | ICD-10-CM | POA: Diagnosis present

## 2021-07-08 DIAGNOSIS — G35 Multiple sclerosis: Secondary | ICD-10-CM | POA: Diagnosis present

## 2021-07-08 DIAGNOSIS — E118 Type 2 diabetes mellitus with unspecified complications: Secondary | ICD-10-CM | POA: Diagnosis not present

## 2021-07-08 DIAGNOSIS — Z8673 Personal history of transient ischemic attack (TIA), and cerebral infarction without residual deficits: Secondary | ICD-10-CM

## 2021-07-08 DIAGNOSIS — T83010A Breakdown (mechanical) of cystostomy catheter, initial encounter: Secondary | ICD-10-CM | POA: Diagnosis not present

## 2021-07-08 DIAGNOSIS — G4700 Insomnia, unspecified: Secondary | ICD-10-CM | POA: Diagnosis present

## 2021-07-08 DIAGNOSIS — Z87891 Personal history of nicotine dependence: Secondary | ICD-10-CM

## 2021-07-08 DIAGNOSIS — T83090A Other mechanical complication of cystostomy catheter, initial encounter: Secondary | ICD-10-CM | POA: Diagnosis present

## 2021-07-08 DIAGNOSIS — H409 Unspecified glaucoma: Secondary | ICD-10-CM | POA: Diagnosis present

## 2021-07-08 DIAGNOSIS — Z20822 Contact with and (suspected) exposure to covid-19: Secondary | ICD-10-CM | POA: Diagnosis present

## 2021-07-08 DIAGNOSIS — Y738 Miscellaneous gastroenterology and urology devices associated with adverse incidents, not elsewhere classified: Secondary | ICD-10-CM | POA: Diagnosis present

## 2021-07-08 DIAGNOSIS — D649 Anemia, unspecified: Secondary | ICD-10-CM | POA: Diagnosis not present

## 2021-07-08 DIAGNOSIS — D509 Iron deficiency anemia, unspecified: Secondary | ICD-10-CM | POA: Diagnosis not present

## 2021-07-08 DIAGNOSIS — E785 Hyperlipidemia, unspecified: Secondary | ICD-10-CM | POA: Diagnosis present

## 2021-07-08 DIAGNOSIS — N12 Tubulo-interstitial nephritis, not specified as acute or chronic: Secondary | ICD-10-CM | POA: Diagnosis present

## 2021-07-08 DIAGNOSIS — N390 Urinary tract infection, site not specified: Secondary | ICD-10-CM | POA: Diagnosis not present

## 2021-07-08 DIAGNOSIS — N10 Acute tubulo-interstitial nephritis: Secondary | ICD-10-CM

## 2021-07-08 DIAGNOSIS — Z833 Family history of diabetes mellitus: Secondary | ICD-10-CM

## 2021-07-08 DIAGNOSIS — D72825 Bandemia: Secondary | ICD-10-CM | POA: Diagnosis not present

## 2021-07-08 LAB — BASIC METABOLIC PANEL
Anion gap: 6 (ref 5–15)
BUN: 23 mg/dL (ref 8–23)
CO2: 24 mmol/L (ref 22–32)
Calcium: 11 mg/dL — ABNORMAL HIGH (ref 8.9–10.3)
Chloride: 112 mmol/L — ABNORMAL HIGH (ref 98–111)
Creatinine, Ser: 0.95 mg/dL (ref 0.44–1.00)
GFR, Estimated: >60 mL/min (ref >60)
Glucose, Bld: 91 mg/dL (ref 70–99)
Potassium: 4.1 mmol/L (ref 3.5–5.1)
Sodium: 142 mmol/L (ref 135–145)

## 2021-07-08 LAB — RESP PANEL BY RT-PCR (FLU A&B, COVID) ARPGX2
Influenza A by PCR: NEGATIVE
Influenza B by PCR: NEGATIVE
SARS Coronavirus 2 by RT PCR: NEGATIVE

## 2021-07-08 LAB — IRON AND TIBC
Iron: 51 ug/dL (ref 28–170)
Saturation Ratios: 14 % (ref 10.4–31.8)
TIBC: 363 ug/dL (ref 250–450)
UIBC: 312 ug/dL

## 2021-07-08 LAB — CBC WITH DIFFERENTIAL/PLATELET
Abs Immature Granulocytes: 0.03 10*3/uL (ref 0.00–0.07)
Basophils Absolute: 0.1 10*3/uL (ref 0.0–0.1)
Basophils Relative: 1 %
Eosinophils Absolute: 0.4 10*3/uL (ref 0.0–0.5)
Eosinophils Relative: 3 %
HCT: 31.8 % — ABNORMAL LOW (ref 36.0–46.0)
Hemoglobin: 9.9 g/dL — ABNORMAL LOW (ref 12.0–15.0)
Immature Granulocytes: 0 %
Lymphocytes Relative: 45 %
Lymphs Abs: 5.1 10*3/uL — ABNORMAL HIGH (ref 0.7–4.0)
MCH: 28.6 pg (ref 26.0–34.0)
MCHC: 31.1 g/dL (ref 30.0–36.0)
MCV: 91.9 fL (ref 80.0–100.0)
Monocytes Absolute: 0.8 10*3/uL (ref 0.1–1.0)
Monocytes Relative: 7 %
Neutro Abs: 5.1 10*3/uL (ref 1.7–7.7)
Neutrophils Relative %: 44 %
Platelets: 413 10*3/uL — ABNORMAL HIGH (ref 150–400)
RBC: 3.46 MIL/uL — ABNORMAL LOW (ref 3.87–5.11)
RDW: 16.5 % — ABNORMAL HIGH (ref 11.5–15.5)
WBC: 11.5 10*3/uL — ABNORMAL HIGH (ref 4.0–10.5)
nRBC: 0 % (ref 0.0–0.2)

## 2021-07-08 LAB — RETICULOCYTES
Immature Retic Fract: 24.4 % — ABNORMAL HIGH (ref 2.3–15.9)
RBC.: 3.44 MIL/uL — ABNORMAL LOW (ref 3.87–5.11)
Retic Count, Absolute: 71.2 10*3/uL (ref 19.0–186.0)
Retic Ct Pct: 2.1 % (ref 0.4–3.1)

## 2021-07-08 LAB — HEMOGLOBIN A1C
Hgb A1c MFr Bld: 6.4 % — ABNORMAL HIGH (ref 4.8–5.6)
Mean Plasma Glucose: 136.98 mg/dL

## 2021-07-08 LAB — HIV ANTIBODY (ROUTINE TESTING W REFLEX): HIV Screen 4th Generation wRfx: NONREACTIVE

## 2021-07-08 LAB — LACTIC ACID, PLASMA: Lactic Acid, Venous: 1.1 mmol/L (ref 0.5–1.9)

## 2021-07-08 LAB — CBG MONITORING, ED: Glucose-Capillary: 113 mg/dL — ABNORMAL HIGH (ref 70–99)

## 2021-07-08 LAB — FERRITIN: Ferritin: 11 ng/mL (ref 11–307)

## 2021-07-08 MED ORDER — SODIUM CHLORIDE 0.9 % IV SOLN
2.0000 g | INTRAVENOUS | Status: DC
  Administered 2021-07-09 – 2021-07-10 (×2): 2 g via INTRAVENOUS
  Filled 2021-07-08 (×3): qty 20

## 2021-07-08 MED ORDER — SODIUM CHLORIDE 0.9 % IV SOLN
2.0000 g | Freq: Once | INTRAVENOUS | Status: AC
  Administered 2021-07-08: 2 g via INTRAVENOUS
  Filled 2021-07-08: qty 20

## 2021-07-08 MED ORDER — ONDANSETRON HCL 4 MG/2ML IJ SOLN: 4.0000 mg | Freq: Four times a day (QID) | INTRAMUSCULAR | Status: DC | PRN

## 2021-07-08 MED ORDER — FAMOTIDINE 20 MG PO TABS: 20.0000 mg | ORAL_TABLET | Freq: Two times a day (BID) | ORAL | Status: DC

## 2021-07-08 MED ORDER — SODIUM CHLORIDE 0.9 % IV BOLUS
500.0000 mL | Freq: Once | INTRAVENOUS | Status: AC
  Administered 2021-07-08: 500 mL via INTRAVENOUS

## 2021-07-08 MED ORDER — HYDROCODONE-ACETAMINOPHEN 5-325 MG PO TABS
1.0000 | ORAL_TABLET | Freq: Four times a day (QID) | ORAL | Status: DC | PRN
  Administered 2021-07-10 – 2021-07-11 (×2): 1 via ORAL
  Filled 2021-07-08 (×3): qty 1

## 2021-07-08 MED ORDER — POLYETHYLENE GLYCOL 3350 17 G PO PACK
17.0000 g | PACK | Freq: Two times a day (BID) | ORAL | Status: DC
  Administered 2021-07-09 – 2021-07-11 (×4): 17 g via ORAL
  Filled 2021-07-08 (×7): qty 1

## 2021-07-08 MED ORDER — ATORVASTATIN CALCIUM 10 MG PO TABS
10.0000 mg | ORAL_TABLET | Freq: Every day | ORAL | Status: DC
  Administered 2021-07-08: 10 mg via ORAL
  Filled 2021-07-08: qty 1

## 2021-07-08 MED ORDER — GABAPENTIN 100 MG PO CAPS
100.0000 mg | ORAL_CAPSULE | Freq: Every day | ORAL | Status: DC
  Administered 2021-07-08 – 2021-07-11 (×4): 100 mg via ORAL
  Filled 2021-07-08 (×4): qty 1

## 2021-07-08 MED ORDER — OXYBUTYNIN CHLORIDE 5 MG PO TABS: 5.0000 mg | ORAL_TABLET | Freq: Two times a day (BID) | ORAL | Status: DC | PRN

## 2021-07-08 MED ORDER — ALBUTEROL SULFATE (2.5 MG/3ML) 0.083% IN NEBU: 2.5000 mg | INHALATION_SOLUTION | Freq: Four times a day (QID) | RESPIRATORY_TRACT | Status: DC | PRN

## 2021-07-08 MED ORDER — IOHEXOL 300 MG/ML  SOLN
100.0000 mL | Freq: Once | INTRAMUSCULAR | Status: AC | PRN
  Administered 2021-07-08: 100 mL via INTRAVENOUS

## 2021-07-08 MED ORDER — FERROUS SULFATE 325 (65 FE) MG PO TBEC: 325.0000 mg | DELAYED_RELEASE_TABLET | Freq: Two times a day (BID) | ORAL | Status: DC

## 2021-07-08 MED ORDER — ONDANSETRON HCL 4 MG PO TABS: 4.0000 mg | ORAL_TABLET | Freq: Four times a day (QID) | ORAL | Status: DC | PRN

## 2021-07-08 MED ORDER — ENOXAPARIN SODIUM 40 MG/0.4ML IJ SOSY
40.0000 mg | PREFILLED_SYRINGE | INTRAMUSCULAR | Status: DC
  Administered 2021-07-08 – 2021-07-11 (×2): 40 mg via SUBCUTANEOUS
  Filled 2021-07-08 (×4): qty 0.4

## 2021-07-08 MED ORDER — ACETAMINOPHEN 325 MG PO TABS: 650.0000 mg | ORAL_TABLET | Freq: Four times a day (QID) | ORAL | Status: DC | PRN

## 2021-07-08 MED ORDER — INSULIN ASPART 100 UNIT/ML IJ SOLN
0.0000 [IU] | Freq: Three times a day (TID) | INTRAMUSCULAR | Status: DC
  Administered 2021-07-09: 5 [IU] via SUBCUTANEOUS
  Administered 2021-07-09: 3 [IU] via SUBCUTANEOUS
  Administered 2021-07-09 – 2021-07-10 (×2): 2 [IU] via SUBCUTANEOUS
  Administered 2021-07-10: 11 [IU] via SUBCUTANEOUS
  Administered 2021-07-10: 2 [IU] via SUBCUTANEOUS
  Administered 2021-07-11: 3 [IU] via SUBCUTANEOUS
  Administered 2021-07-11: 2 [IU] via SUBCUTANEOUS
  Administered 2021-07-11 – 2021-07-12 (×3): 3 [IU] via SUBCUTANEOUS

## 2021-07-08 NOTE — ED Notes (Signed)
Pt given peanut butter, crackers, and sprite per request, due to no sandwiches available. No acute changes noted. Will continue to monitor. Pt continues to have no urine noted in foley drainage bag.

## 2021-07-08 NOTE — ED Notes (Signed)
Pt given apple juice per request.

## 2021-07-08 NOTE — ED Provider Notes (Addendum)
[facility] EMERGENCY DEPARTMENT Provider Note   CSN: [id number] Arrival date & time: 07/08/21  2440     History  Chief Complaint  Patient presents with   urinary catheter    [name] is a 61 y.o. female.  HPI She is here for evaluation of a nonfunctioning urinary catheter.  She lives in an assisted living facility and has paraplegia.  She is unable to give any history.    Home Medications Prior to Admission medications   Medication Sig Start Date End Date Taking? Authorizing Provider  acetaminophen (TYLENOL) 325 MG tablet Take 2 tablets (650 mg total) by mouth every 6 (six) hours as needed for fever. 03/20/16   Bonnielee Haff, MD  albuterol (PROVENTIL) (2.5 MG/3ML) 0.083% nebulizer solution Take 3 mLs (2.5 mg total) by nebulization every 6 (six) hours as needed for wheezing or shortness of breath. 12/30/15   Robbie Lis, MD  atorvastatin (LIPITOR) 10 MG tablet Take 10 mg by mouth at bedtime.     [provider]  clindamycin (CLEOCIN T) 1 % external solution Apply 1 application topically daily.    [provider]  doxycycline (VIBRA-TABS) 100 MG tablet Take 100 mg by mouth daily.    [provider]  famotidine (PEPCID) 20 MG tablet Take 1 tablet (20 mg total) by mouth 2 (two) times daily. 11/18/15   Cherene Altes, MD  ferrous sulfate 325 (65 FE) MG EC tablet Take 325 mg by mouth 2 (two) times daily.    [provider]  gabapentin (NEURONTIN) 100 MG capsule Take 1 capsule (100 mg total) by mouth at bedtime. 11/18/15   Cherene Altes, MD  glipiZIDE (GLUCOTROL) 2.5 mg TABS tablet Take 5 mg by mouth daily before breakfast.     [provider]  HYDROcodone-acetaminophen (NORCO/VICODIN) 5-325 MG tablet Take 1 tablet by mouth every 6 (six) hours as needed for moderate pain.    [provider]  metFORMIN (GLUCOPHAGE) 500 MG tablet Take 0.5 tablets (250 mg total) by mouth 2 (two) times daily. Patient taking  differently: Take 500 mg by mouth 2 (two) times daily.  04/22/16   Ngetich, Dinah C, NP  neomycin-bacitracin-polymyxin (NEOSPORIN) OINT Apply 1 application topically 2 (two) times daily. To neck area and upper chest 09/19/17   Bonnielee Haff, MD  oxybutynin (DITROPAN) 5 MG tablet Take 5 mg by mouth 2 (two) times daily as needed for bladder spasms.    [provider]  polyethylene glycol (MIRALAX / GLYCOLAX) packet Take 17 g by mouth 2 (two) times daily. 11/18/15   Cherene Altes, MD  pyrithione zinc (HEAD AND SHOULDERS) 1 % shampoo Apply topically every other day.    [provider]      Allergies    Patient has no known allergies.    Review of Systems   Review of Systems  Physical Exam Updated Vital Signs BP 126/68    Pulse 85    Temp 98.2 F (36.8 C) (Oral)    Resp 18    SpO2 100%  Physical Exam Vitals and nursing note reviewed.  Constitutional:      General: She is not in acute distress.    Appearance: She is well-developed. She is obese. She is not ill-appearing or diaphoretic.  HENT:     Head: Normocephalic and atraumatic.     Right Ear: External ear normal.     Left Ear: External ear normal.  Eyes:     Conjunctiva/sclera: Conjunctivae  normal.     Pupils: Pupils are equal, round, and reactive to light.  Neck:     Trachea: Phonation normal.  Cardiovascular:     Rate and Rhythm: Normal rate and regular rhythm.     Heart sounds: Normal heart sounds.  Pulmonary:     Effort: Pulmonary effort is normal. No respiratory distress.     Breath sounds: Normal breath sounds. No stridor.  Abdominal:     Palpations: Abdomen is soft.     Comments: Lower abdomen is diffusely tender.  There is a midline suprapubic catheter, the ostomy is notable for purulent discharge.  There is localized erythema and tenderness with mild induration around the ostomy.  The suprapubic catheter device otherwise appears normal.  She has a large panniculus.  There is no other areas of  abdominal tenderness.  Musculoskeletal:        General: Normal range of motion.     Cervical back: Normal range of motion and neck supple.  Skin:    General: Skin is warm and dry.  Neurological:     Mental Status: She is alert and oriented to person, place, and time.     Cranial Nerves: No cranial nerve deficit.     Sensory: No sensory deficit.     Motor: No abnormal muscle tone.     Coordination: Coordination normal.  Psychiatric:        Mood and Affect: Mood normal.        Behavior: Behavior normal.        Thought Content: Thought content normal.        Judgment: Judgment normal.    ED Results / Procedures / Treatments   Labs (all labs ordered are listed, but only abnormal results are displayed) Labs Reviewed  BASIC METABOLIC PANEL - Abnormal; Notable for the following components:      Result Value   Chloride 112 (*)    Calcium 11.0 (*)    All other components within normal limits  CBC WITH DIFFERENTIAL/PLATELET - Abnormal; Notable for the following components:   WBC 11.5 (*)    RBC 3.46 (*)    Hemoglobin 9.9 (*)    HCT 31.8 (*)    RDW 16.5 (*)    Platelets 413 (*)    Lymphs Abs 5.1 (*)    All other components within normal limits  RESP PANEL BY RT-PCR (FLU A&B, COVID) ARPGX2  CULTURE, BLOOD (ROUTINE X 2)  CULTURE, BLOOD (ROUTINE X 2)  URINE CULTURE  LACTIC ACID, PLASMA  URINALYSIS, ROUTINE W REFLEX MICROSCOPIC  HIV ANTIBODY (ROUTINE TESTING W REFLEX)  CBC  BASIC METABOLIC PANEL  HEMOGLOBIN A1C    EKG None  Radiology CT Abdomen Pelvis W Contrast  Result Date: 07/08/2021 CLINICAL DATA:  Peritonitis or perforation suspected. Abdominal pain. EXAM: CT ABDOMEN AND PELVIS WITH CONTRAST TECHNIQUE: Multidetector CT imaging of the abdomen and pelvis was performed using the standard protocol following bolus administration of intravenous contrast. RADIATION DOSE REDUCTION: This exam was performed according to the departmental dose-optimization program which includes  automated exposure control, adjustment of the mA and/or kV according to patient size and/or use of iterative reconstruction technique. CONTRAST:  124mL OMNIPAQUE IOHEXOL 300 MG/ML  SOLN COMPARISON:  12/26/2019 FINDINGS: Lower chest: Bibasilar scarring or subsegmental atelectasis. Normal heart size with trace pericardial fluid. No pleural fluid. Hepatobiliary: Tiny low-density liver lesions are likely cysts. Normal gallbladder, without biliary ductal dilatation. Pancreas: Normal, without mass or ductal dilatation. Spleen: Normal in size, without focal abnormality. Adrenals/Urinary  Tract: Normal adrenal glands. Left larger than right renal collecting system calculi of up to approximately 4 mm. Interpolar left renal cyst of 3.7 cm. Bilateral too small to characterize renal lesions. Subtle heterogeneous right renal enhancement, suspicious for pyelonephritis. Both renal collecting systems and proximal ureters demonstrate wall thickening. Moderate bilateral hydronephrosis with mild hydroureter, present on 12/26/2019. Suprapubic catheter with small bladder saccules. Posterior bladder wall thickening including on 81/3, mild. More diffuse minimal bladder wall thickening throughout. Stomach/Bowel: Proximal gastric underdistention. Normal colon, appendix, and terminal ileum. Normal small bowel. Vascular/Lymphatic: Aortic and branch vessel atherosclerosis. Multiple small abdominal retroperitoneal nodes are chronic and likely reactive. Reproductive: Fibroid uterus.  No adnexal mass. Other: No significant free fluid. No free intraperitoneal air. Soft tissue thickening superficial to the right ischial tuberosity on 87/3, without well-defined ulcer. Musculoskeletal: No acute osseous abnormality. Disc bulges at L4-5 and L5-S1 IMPRESSION: 1. Suprapubic catheter in place with findings likely of ascending infection including bilateral ureteritis and probable right-sided pyelonephritis. Somewhat more focal bladder wall thickening  posteriorly may also be due to infection/inflammation. Consider correlation with urinalysis to exclude unlikely bladder mass. 2. Left nephrolithiasis. Chronic caliectasis and hydroureter, possibly secondary to suprapubic catheter. 3. No other explanation for patient's symptoms. 4. Fibroid uterus. 5. Soft tissue thickening superficial the right ischial tuberosity without gross underlying osseous destruction. Consider physical exam correlation. Electronically Signed   By: Abigail Miyamoto M.D.   On: 07/08/2021 15:31    Procedures SUPRAPUBIC TUBE PLACEMENT  Date/Time: 07/08/2021 5:19 PM Performed by: Daleen Bo, MD Authorized by: Daleen Bo, MD   Consent:    Consent obtained:  Verbal   Consent given by:  Patient   Risks discussed:  Bleeding   Alternatives discussed:  No treatment Universal protocol:    Immediately prior to procedure, a time out was called: yes     Patient identity confirmed:  Verbally with patient Sedation:    Sedation type:  None Anesthesia:    Anesthesia method:  None Procedure details:    Complexity:  Simple   Catheter type:  Foley   Catheter size:  20 Fr   Ultrasound guidance: no     Number of attempts:  1 Post-procedure details:    Procedure completion:  Tolerated well, no immediate complications Comments:     Suprapubic catheter replaced into the bladder without complication.  No urine obtained initially, as expected because she had been having continual drainage with the prior catheter.  The catheter was changed out, because of urinary tract infection.  We will send urinalysis and urine culture on new urine after it is obtained.    Medications Ordered in ED Medications  cefTRIAXone (ROCEPHIN) 2 g in sodium chloride 0.9 % 100 mL IVPB (has no administration in time range)  cefTRIAXone (ROCEPHIN) 2 g in sodium chloride 0.9 % 100 mL IVPB (has no administration in time range)  acetaminophen (TYLENOL) tablet 650 mg (has no administration in time range)   HYDROcodone-acetaminophen (NORCO/VICODIN) 5-325 MG per tablet 1 tablet (has no administration in time range)  atorvastatin (LIPITOR) tablet 10 mg (has no administration in time range)  famotidine (PEPCID) tablet 20 mg (has no administration in time range)  polyethylene glycol (MIRALAX / GLYCOLAX) packet 17 g (has no administration in time range)  oxybutynin (DITROPAN) tablet 5 mg (has no administration in time range)  ferrous sulfate EC tablet 325 mg (has no administration in time range)  gabapentin (NEURONTIN) capsule 100 mg (has no administration in time range)  albuterol (PROVENTIL) (2.5 MG/3ML)  0.083% nebulizer solution 2.5 mg (has no administration in time range)  enoxaparin (LOVENOX) injection 40 mg (has no administration in time range)  ondansetron (ZOFRAN) tablet 4 mg (has no administration in time range)    Or  ondansetron (ZOFRAN) injection 4 mg (has no administration in time range)  insulin aspart (novoLOG) injection 0-15 Units (has no administration in time range)  sodium chloride 0.9 % bolus 500 mL (0 mLs Intravenous Stopped 07/08/21 1648)  iohexol (OMNIPAQUE) 300 MG/ML solution 100 mL (100 mLs Intravenous Contrast Given 07/08/21 1510)    ED Course/ Medical Decision Making/ A&P                           Medical Decision Making Patient presenting from her facility for evaluation of abdominal pain, associated with presence of a suprapubic catheter.  She cannot give any history.  Problems Addressed: Lower abdominal pain: undiagnosed new problem with uncertain prognosis    Details: Concerning for infected ostomy, to the urinary bladder.  Bladder scan showed a decompressed urinary bladder.  Good catheter output of yellow urine.  Amount and/or Complexity of Data Reviewed Labs: ordered.    Details: CBC, metabolic panel, blood cultures, lactate, urine culture-normal except hemoglobin low, chloride high, calcium high Radiology: ordered.    Details: CT abdomen pelvis-findings are  consistent with infected urinary ostomy, infected bladder and right-sided pyelonephritis. Discussion of management or test interpretation with external provider(s): Case discussed with urologist, Dr. Abner Greenspan, who recommends change out suprapubic catheter, replaced with same device, and agrees with overnight hospitalization for treatment of UTI.  His service does not need to see the patient unless there is a problem exchanging catheter or other urologic difficulties arise.  Case discussed with hospitalist to arrange overnight observation.  Risk Prescription drug management. Decision regarding hospitalization. Risk Details: Patient with abdominal wall tenderness and drainage from her urinary catheter ostomy consistent with localized infection.  CT imaging indicates generalized infection, a sending into the right kidney.  Patient requires overnight hospitalization for observation and stabilization with intravenous antibiotics.  Her suprapubic catheter was changed out by me without complication.  Doubt sepsis, metabolic instability or impending vascular collapse.  No evidence for other sources of infection or complications.           Final Clinical Impression(s) / ED Diagnoses Final diagnoses:  Lower abdominal pain  Pyelonephritis    Rx / DC Orders ED Discharge Orders     None         Daleen Bo, MD 07/08/21 1722    Daleen Bo, MD 07/15/21 1231

## 2021-07-08 NOTE — ED Notes (Signed)
Patient transported to CT 

## 2021-07-08 NOTE — ED Triage Notes (Signed)
EMS stated , pt's urinary catheter not draining nothing coming out since 5-7 days . Pt is paraplegic , Denies any symptoms. Comes from [facility].

## 2021-07-08 NOTE — H&P (Signed)
History and Physical    ADREONA BRAND PPI:[id number] DOB: [birth year] DOA: 07/08/2021  PCP: Pcp, No (Confirm with patient/family/NH records and if not entered, this has to be entered at [facility] point of entry) Patient coming from: Assisted living  I have personally briefly reviewed patient's old medical records in [place]  Chief Complaint: Feeling ok  HPI: [name] is a 61 y.o. female with medical history significant of multiple sclerosis with baseline functional paraplegic, neurogenic bladder status post suprapubic urinary catheter, IIDM, HTN, ESBL UTI in 2018, recurrent UTI/pyelonephritis 2022, chronic normocytic anemia, sent from nursing home for malfunction of suprapubic urine catheter.  Patient has no specific complaints, denies any abdominal pain or back pain, no nauseous or vomiting no fever chills.  According to the patient, her suprapubic urine catheter has not been functioning well for 5 days, with little urine coming out.  And today it was noticed by facility staff that there was pus coming out from suprapubic catheter and patient was sent to the ED.  ED Course: Patient is afebrile, no hypotension no tachycardia.  Creatinine 0.9, K4.1, WBC 11.5.  CT abdomen pelvis showed suprapubic catheter in place with signs of ascending infection including bilateral ureter and probably right-sided pyelonephritis.  Urology consulted and ordered suprapubic catheter exchange and patient was given 1 dose of ceftriaxone in the ED.  Review of Systems: As per HPI otherwise 14 point review of systems negative.    Past Medical History:  Diagnosis Date   Acute parametritis and pelvic cellulitis    Acute renal failure superimposed on stage 3 chronic kidney disease ([place]) 07/21/2015   Anemia    chronic    Contracture, right hand    Diabetes mellitus without complication (HCC)    Diabetic neuropathy (HCC)    DVT (deep venous thrombosis) (HCC)    DVT (deep venous thrombosis) (HCC)    GERD  (gastroesophageal reflux disease)    History of kidney stones    Hx of sepsis    Hyperlipidemia    Hypertension    Hypokalemia    Hypomagnesemia    Left nephrolithiasis    Leukocytosis    Lymph edema    chronic    Malnutrition of moderate degree 07/21/2015   Multiple sclerosis diagnosed 2002-not on Therapy any longer 06/26/2012   Muscle weakness (generalized)    Neuromuscular disorder (HCC)    Neuromuscular dysfunction of bladder    Polyneuropathy    Presence of indwelling urinary catheter    Protein calorie malnutrition (HCC)    Stage 4 decubitus ulcer ([place]) 07/05/2015   Stroke ([place])    UTI (lower urinary tract infection)     Past Surgical History:  Procedure Laterality Date   COLONOSCOPY WITH PROPOFOL N/A 07/25/2016   Procedure: COLONOSCOPY WITH PROPOFOL;  Surgeon: Laurence Spates, MD;  Location: [facility];  Service: Endoscopy;  Laterality: N/A;   CYSTOSCOPY W/ RETROGRADES Bilateral 09/18/2017   Procedure: CYSTOSCOPY WITH RETROGRADE PYELOGRAM;  Surgeon: Franchot Gallo, MD;  Location: [facility];  Service: Urology;  Laterality: Bilateral;   CYSTOSCOPY/URETEROSCOPY/HOLMIUM LASER/STENT PLACEMENT Left 09/18/2017   Procedure: CYSTOSCOPY/LEFTURETEROSCOPY/URETERAL STENT PLACEMENT STONE EXTRACTION;  Surgeon: Franchot Gallo, MD;  Location: [facility];  Service: Urology;  Laterality: Left;   ESOPHAGOGASTRODUODENOSCOPY Left 01/02/2015   Procedure: ESOPHAGOGASTRODUODENOSCOPY (EGD);  Surgeon: Arta Silence, MD;  Location: [facility];  Service: Endoscopy;  Laterality: Left;   ESOPHAGOGASTRODUODENOSCOPY (EGD) WITH PROPOFOL N/A 07/25/2016   Procedure: ESOPHAGOGASTRODUODENOSCOPY (EGD) WITH PROPOFOL;  Surgeon: Laurence Spates, MD;  [facility]: [facility];  Service: Endoscopy;  Laterality: N/A;   HERNIA MESH REMOVAL     HOLMIUM LASER APPLICATION Left 12/13/7104   Procedure: HOLMIUM LASER APPLICATION;  Surgeon: Franchot Gallo, MD;  Location: [facility];  Service: Urology;  Laterality: Left;   IR CATHETER  TUBE CHANGE  10/06/2016   IR CATHETER TUBE CHANGE  11/17/2016   IR CATHETER TUBE CHANGE  02/10/2017   IR GENERIC HISTORICAL  02/23/2016   IR NEPHROSTOMY EXCHANGE LEFT 02/23/2016 Sandi Mariscal, MD [facility]   IR GENERIC HISTORICAL  04/19/2016   IR NEPHROSTOMY EXCHANGE LEFT 04/19/2016 Arne Cleveland, MD [facility]   IR GENERIC HISTORICAL  05/13/2016   IR NEPHROSTOMY EXCHANGE LEFT 05/13/2016 Corrie Mckusick, DO [facility]   IR GENERIC HISTORICAL  05/13/2016   IR NEPHROSTOMY PLACEMENT RIGHT 05/13/2016 Corrie Mckusick, DO [facility]   IR GENERIC HISTORICAL  07/07/2016   IR NEPHROSTOMY EXCHANGE RIGHT 07/07/2016 Jacqulynn Cadet, MD [facility]   IR GENERIC HISTORICAL  07/07/2016   IR NEPHROSTOMY EXCHANGE LEFT 07/07/2016 Jacqulynn Cadet, MD [facility]   IR NEPHROSTOGRAM LEFT THRU EXISTING ACCESS  12/28/2016   IR NEPHROSTOGRAM RIGHT THRU EXISTING ACCESS  12/28/2016   IR NEPHROSTOMY EXCHANGE LEFT  10/06/2016   IR NEPHROSTOMY EXCHANGE LEFT  11/17/2016   IR NEPHROSTOMY EXCHANGE RIGHT  10/06/2016   IR NEPHROSTOMY EXCHANGE RIGHT  11/17/2016   NEPHROLITHOTOMY Left 08/18/2016   Procedure: NEPHROLITHOTOMY LEFT PERCUTANEOUS;  Surgeon: Franchot Gallo, MD;  Location: [facility];  Service: Urology;  Laterality: Left;   NEPHROSTOMY TUBE PLACEMENT ([place] [facility])     TRANSURETHRAL RESECTION OF BLADDER TUMOR N/A 03/16/2016   Procedure: CYSTOSCOPY BLADDER BIOPSY;  Surgeon: Franchot Gallo, MD;  Location: [facility];  Service: Urology;  Laterality: N/A;   TUBAL LIGATION     tubes tided       reports that she quit smoking about 6 years ago. Her smoking use included cigarettes. She has a 20.00 pack-year smoking history. She has never used smokeless tobacco. She reports that she does not currently use alcohol after a past usage of about 5.0 standard drinks per week. She reports that she does not use drugs.  No Known Allergies  Family History  Problem Relation Age of Onset   Diabetes Mother    Alzheimer's disease Mother    Diabetes  Father    Diabetes Sister    Scoliosis Sister      Prior to Admission medications   Medication Sig Start Date End Date Taking? Authorizing Provider  acetaminophen (TYLENOL) 325 MG tablet Take 2 tablets (650 mg total) by mouth every 6 (six) hours as needed for fever. 03/20/16   Bonnielee Haff, MD  albuterol (PROVENTIL) (2.5 MG/3ML) 0.083% nebulizer solution Take 3 mLs (2.5 mg total) by nebulization every 6 (six) hours as needed for wheezing or shortness of breath. 12/30/15   Robbie Lis, MD  atorvastatin (LIPITOR) 10 MG tablet Take 10 mg by mouth at bedtime.     [provider]  clindamycin (CLEOCIN T) 1 % external solution Apply 1 application topically daily.    [provider]  doxycycline (VIBRA-TABS) 100 MG tablet Take 100 mg by mouth daily.    [provider]  famotidine (PEPCID) 20 MG tablet Take 1 tablet (20 mg total) by mouth 2 (two) times daily. 11/18/15   Cherene Altes, MD  ferrous sulfate 325 (65 FE) MG EC tablet Take 325 mg by mouth 2 (two) times daily.    [provider]  gabapentin (NEURONTIN) 100 MG  capsule Take 1 capsule (100 mg total) by mouth at bedtime. 11/18/15   Cherene Altes, MD  glipiZIDE (GLUCOTROL) 2.5 mg TABS tablet Take 5 mg by mouth daily before breakfast.     [provider]  HYDROcodone-acetaminophen (NORCO/VICODIN) 5-325 MG tablet Take 1 tablet by mouth every 6 (six) hours as needed for moderate pain.    [provider]  metFORMIN (GLUCOPHAGE) 500 MG tablet Take 0.5 tablets (250 mg total) by mouth 2 (two) times daily. Patient taking differently: Take 500 mg by mouth 2 (two) times daily.  04/22/16   Ngetich, Dinah C, NP  neomycin-bacitracin-polymyxin (NEOSPORIN) OINT Apply 1 application topically 2 (two) times daily. To neck area and upper chest 09/19/17   Bonnielee Haff, MD  oxybutynin (DITROPAN) 5 MG tablet Take 5 mg by mouth 2 (two) times daily as needed for bladder spasms.    [provider]   polyethylene glycol (MIRALAX / GLYCOLAX) packet Take 17 g by mouth 2 (two) times daily. 11/18/15   Cherene Altes, MD  pyrithione zinc (HEAD AND SHOULDERS) 1 % shampoo Apply topically every other day.    [provider]    Physical Exam: Vitals:   07/08/21 1220 07/08/21 1315 07/08/21 1515 07/08/21 1630  BP: 129/74 133/76 129/72 126/68  Pulse: 94 93 94 85  Resp: (!) 22 20 20 18   Temp:      TempSrc:      SpO2: 100% 99% 99% 100%    Constitutional: NAD, calm, comfortable Vitals:   07/08/21 1220 07/08/21 1315 07/08/21 1515 07/08/21 1630  BP: 129/74 133/76 129/72 126/68  Pulse: 94 93 94 85  Resp: (!) 22 20 20 18   Temp:      TempSrc:      SpO2: 100% 99% 99% 100%   Eyes: PERRL, lids and conjunctivae normal ENMT: Mucous membranes are moist. Posterior pharynx clear of any exudate or lesions.Normal dentition.  Neck: normal, supple, no masses, no thyromegaly Respiratory: clear to auscultation bilaterally, no wheezing, no crackles. Normal respiratory effort. No accessory muscle use.  Cardiovascular: Regular rate and rhythm, no murmurs / rubs / gallops. No extremity edema. 2+ pedal pulses. No carotid bruits.  Abdomen: no tenderness, no masses palpated. No hepatosplenomegaly. Bowel sounds positive.  Musculoskeletal: no clubbing / cyanosis. No joint deformity upper and lower extremities. Good ROM, no contractures. Normal muscle tone.  Skin: no rashes, lesions, ulcers. No induration Neurologic: CN 2-12 grossly intact. Sensation intact, DTR normal. Strength 5/5 in all 4.  Psychiatric: Normal judgment and insight. Alert and oriented x 3. Normal mood.    Labs on Admission: I have personally reviewed following labs and imaging studies  CBC: Recent Labs  Lab 07/08/21 1325  WBC 11.5*  NEUTROABS 5.1  HGB 9.9*  HCT 31.8*  MCV 91.9  PLT 517*   Basic Metabolic Panel: Recent Labs  Lab 07/08/21 1325  NA 142  K 4.1  CL 112*  CO2 24  GLUCOSE 91  BUN 23  CREATININE 0.95   CALCIUM 11.0*   GFR: CrCl cannot be calculated (Unknown ideal weight.). Liver Function Tests: No results for input(s): AST, ALT, ALKPHOS, BILITOT, PROT, ALBUMIN in the last 168 hours. No results for input(s): LIPASE, AMYLASE in the last 168 hours. No results for input(s): AMMONIA in the last 168 hours. Coagulation Profile: No results for input(s): INR, PROTIME in the last 168 hours. Cardiac Enzymes: No results for input(s): CKTOTAL, CKMB, CKMBINDEX, TROPONINI in the last 168 hours. BNP (last 3 results) No results  for input(s): PROBNP in the last 8760 hours. HbA1C: No results for input(s): HGBA1C in the last 72 hours. CBG: No results for input(s): GLUCAP in the last 168 hours. Lipid Profile: No results for input(s): CHOL, HDL, LDLCALC, TRIG, CHOLHDL, LDLDIRECT in the last 72 hours. Thyroid Function Tests: No results for input(s): TSH, T4TOTAL, FREET4, T3FREE, THYROIDAB in the last 72 hours. Anemia Panel: No results for input(s): VITAMINB12, FOLATE, FERRITIN, TIBC, IRON, RETICCTPCT in the last 72 hours. Urine analysis:    Component Value Date/Time   COLORURINE AMBER (A) 11/23/2017 2148   APPEARANCEUR TURBID (A) 11/23/2017 2148   LABSPEC 1.013 11/23/2017 2148   PHURINE 7.0 11/23/2017 2148   GLUCOSEU NEGATIVE 11/23/2017 2148   HGBUR MODERATE (A) 11/23/2017 2148   BILIRUBINUR NEGATIVE 11/23/2017 2148   [place] NEGATIVE 11/23/2017 2148   PROTEINUR 100 (A) 11/23/2017 2148   UROBILINOGEN 0.2 12/20/2014 1851   NITRITE NEGATIVE 11/23/2017 2148   LEUKOCYTESUR MODERATE (A) 11/23/2017 2148    Radiological Exams on Admission: CT Abdomen Pelvis W Contrast  Result Date: 07/08/2021 CLINICAL DATA:  Peritonitis or perforation suspected. Abdominal pain. EXAM: CT ABDOMEN AND PELVIS WITH CONTRAST TECHNIQUE: Multidetector CT imaging of the abdomen and pelvis was performed using the standard protocol following bolus administration of intravenous contrast. RADIATION DOSE REDUCTION: This exam  was performed according to the departmental dose-optimization program which includes automated exposure control, adjustment of the mA and/or kV according to patient size and/or use of iterative reconstruction technique. CONTRAST:  157mL OMNIPAQUE IOHEXOL 300 MG/ML  SOLN COMPARISON:  12/26/2019 FINDINGS: Lower chest: Bibasilar scarring or subsegmental atelectasis. Normal heart size with trace pericardial fluid. No pleural fluid. Hepatobiliary: Tiny low-density liver lesions are likely cysts. Normal gallbladder, without biliary ductal dilatation. Pancreas: Normal, without mass or ductal dilatation. Spleen: Normal in size, without focal abnormality. Adrenals/Urinary Tract: Normal adrenal glands. Left larger than right renal collecting system calculi of up to approximately 4 mm. Interpolar left renal cyst of 3.7 cm. Bilateral too small to characterize renal lesions. Subtle heterogeneous right renal enhancement, suspicious for pyelonephritis. Both renal collecting systems and proximal ureters demonstrate wall thickening. Moderate bilateral hydronephrosis with mild hydroureter, present on 12/26/2019. Suprapubic catheter with small bladder saccules. Posterior bladder wall thickening including on 81/3, mild. More diffuse minimal bladder wall thickening throughout. Stomach/Bowel: Proximal gastric underdistention. Normal colon, appendix, and terminal ileum. Normal small bowel. Vascular/Lymphatic: Aortic and branch vessel atherosclerosis. Multiple small abdominal retroperitoneal nodes are chronic and likely reactive. Reproductive: Fibroid uterus.  No adnexal mass. Other: No significant free fluid. No free intraperitoneal air. Soft tissue thickening superficial to the right ischial tuberosity on 87/3, without well-defined ulcer. Musculoskeletal: No acute osseous abnormality. Disc bulges at L4-5 and L5-S1 IMPRESSION: 1. Suprapubic catheter in place with findings likely of ascending infection including bilateral ureteritis and  probable right-sided pyelonephritis. Somewhat more focal bladder wall thickening posteriorly may also be due to infection/inflammation. Consider correlation with urinalysis to exclude unlikely bladder mass. 2. Left nephrolithiasis. Chronic caliectasis and hydroureter, possibly secondary to suprapubic catheter. 3. No other explanation for patient's symptoms. 4. Fibroid uterus. 5. Soft tissue thickening superficial the right ischial tuberosity without gross underlying osseous destruction. Consider physical exam correlation. Electronically Signed   By: Abigail Miyamoto M.D.   On: 07/08/2021 15:31    EKG: None  Assessment/Plan Principal Problem:   Pyelonephritis Active Problems:   Acute pyelonephritis  (please populate well all problems here in Problem List. (For example, if patient is on BP meds at home and you resume  or decide to hold them, it is a problem that needs to be her. Same for CAD, COPD, HLD and so on)  Complicated UTI and acute pyelonephritis -Secondary to malfunction no suprapubic urine catheter -ED physician discussed with urology regarding the CT finding, neurology recommended suprapubic catheter change and outpatient urology follow-up -Patient vital signs stable, no symptoms or signs of sepsis, despite remote history of ESBL UTI, most recent urine culture last year showed mixed flora x2, will maintain patient on ceftriaxone for now while waiting for urine culture.  Given the complexity of anatomical issue, plan to admit patient to hospital and treat with IV antibiotics until urine culture resulted, packed more than 2 midnight hospital stay.  HTN -Stable, continue home BP meds.  IIDM -Patient received IV contrast, will hold metformin for 3 days. -Sliding scale for now.  Abdominal CT finding -Abnormal CT finding of right-sided pathology with sliding superficial right ischial tuberosity, physical exam showed no local tenderness and patient denied any abdominal pain.  Functional  paraplegia -At baseline.  Chronic normocytic anemia -Check iron study and reticulocyte count.  DVT prophylaxis: Lovenox Code Status: Full code Family Communication: None at bedside Disposition Plan: Expect more than 2 midnight hospital stay as explained above. Consults called: Urology Admission status: MedSurg admission   Lequita Halt MD [facility] Pager [phone]  07/08/2021, 5:19 PM

## 2021-07-09 LAB — URINALYSIS, ROUTINE W REFLEX MICROSCOPIC
Bilirubin Urine: NEGATIVE
Glucose, UA: NEGATIVE mg/dL
Ketones, ur: NEGATIVE mg/dL
Nitrite: NEGATIVE
Protein, ur: 30 mg/dL — AB
Specific Gravity, Urine: 1.029 (ref 1.005–1.030)
WBC, UA: >50 WBC/hpf — ABNORMAL HIGH (ref 0–5)
pH: 5 (ref 5.0–8.0)

## 2021-07-09 LAB — BASIC METABOLIC PANEL
Anion gap: 6 (ref 5–15)
BUN: 21 mg/dL (ref 8–23)
CO2: 23 mmol/L (ref 22–32)
Calcium: 10.1 mg/dL (ref 8.9–10.3)
Chloride: 112 mmol/L — ABNORMAL HIGH (ref 98–111)
Creatinine, Ser: 0.96 mg/dL (ref 0.44–1.00)
GFR, Estimated: >60 mL/min (ref >60)
Glucose, Bld: 120 mg/dL — ABNORMAL HIGH (ref 70–99)
Potassium: 4.4 mmol/L (ref 3.5–5.1)
Sodium: 141 mmol/L (ref 135–145)

## 2021-07-09 LAB — CBC
HCT: 32.8 % — ABNORMAL LOW (ref 36.0–46.0)
Hemoglobin: 9.9 g/dL — ABNORMAL LOW (ref 12.0–15.0)
MCH: 27.9 pg (ref 26.0–34.0)
MCHC: 30.2 g/dL (ref 30.0–36.0)
MCV: 92.4 fL (ref 80.0–100.0)
Platelets: 385 10*3/uL (ref 150–400)
RBC: 3.55 MIL/uL — ABNORMAL LOW (ref 3.87–5.11)
RDW: 16.6 % — ABNORMAL HIGH (ref 11.5–15.5)
WBC: 9.6 10*3/uL (ref 4.0–10.5)
nRBC: 0 % (ref 0.0–0.2)

## 2021-07-09 LAB — CBG MONITORING, ED
Glucose-Capillary: 179 mg/dL — ABNORMAL HIGH (ref 70–99)
Glucose-Capillary: 207 mg/dL — ABNORMAL HIGH (ref 70–99)
Glucose-Capillary: 129 mg/dL — ABNORMAL HIGH (ref 70–99)

## 2021-07-09 LAB — GLUCOSE, CAPILLARY: Glucose-Capillary: 173 mg/dL — ABNORMAL HIGH (ref 70–99)

## 2021-07-09 MED ORDER — LATANOPROST 0.005 % OP SOLN
1.0000 [drp] | Freq: Every day | OPHTHALMIC | Status: DC
  Administered 2021-07-09 – 2021-07-11 (×3): 1 [drp] via OPHTHALMIC
  Filled 2021-07-09: qty 2.5

## 2021-07-09 MED ORDER — ATORVASTATIN CALCIUM 10 MG PO TABS
20.0000 mg | ORAL_TABLET | Freq: Every day | ORAL | Status: DC
  Administered 2021-07-09 – 2021-07-11 (×3): 20 mg via ORAL
  Filled 2021-07-09 (×3): qty 2

## 2021-07-09 MED ORDER — MIRABEGRON ER 25 MG PO TB24
25.0000 mg | ORAL_TABLET | Freq: Every day | ORAL | Status: DC
  Administered 2021-07-09 – 2021-07-12 (×4): 25 mg via ORAL
  Filled 2021-07-09 (×4): qty 1

## 2021-07-09 MED ORDER — MIRTAZAPINE 7.5 MG PO TABS
7.5000 mg | ORAL_TABLET | Freq: Every day | ORAL | Status: DC
  Administered 2021-07-09 – 2021-07-11 (×3): 7.5 mg via ORAL
  Filled 2021-07-09 (×4): qty 1

## 2021-07-09 NOTE — ED Notes (Signed)
Noticed the suprapubic catheter tip extending from the patient's urethral area when assisting the tech with her peri care and brief change. Urethral area- all skin is in tact, there is no sign of blood or drainage noted. Peri care and brief change was done and patient tolerated well. Dr [place] notified.

## 2021-07-09 NOTE — ED Notes (Addendum)
Pt continues to have no urine output in suprapubic catheter drainage bag. Bladder scan performed and showed 38ml in bladder. Pt had on saturated brief that was unexpected with catheter being in place. Noted thick green/yellow D/C around the insertion site of the catheter, 4x4 applied around catheter entrance and cleaned with soap and water. New brief applied and pt cleansed of urine. MD notified, no additional orders received at this time. Pt denies any complaints at present. Will continue to monitor.

## 2021-07-09 NOTE — ED Notes (Signed)
Dr [place] is at bedside.

## 2021-07-09 NOTE — ED Notes (Addendum)
Soiled Brief changed, saturated with urine, cleaned with soap and water, new brief in place. Very little amount of urine in the suprapubic catheter drainage bag. Bladder scan reveals 23ml in bladder. Dressing around suprapubic opening is dry. Urine coming from urethra not catheter. MD notified. Waiting for further orders. No acute changes noted. Will continue to monitor.

## 2021-07-09 NOTE — Progress Notes (Addendum)
Suprapubic catheter exchanged completed for 16FR, clear yellow urine returned. Pt tolerated well.

## 2021-07-09 NOTE — Progress Notes (Addendum)
PROGRESS NOTE    [name]  ERD:[id number] DOB: 17-Apr-1961 DOA: 07/08/2021 PCP: Merryl Hacker, No    Brief Narrative:  [name] is a 61 year old female with past medical history significant for multiple sclerosis with baseline functional paraplegia, neurogenic bladder s/p suprapubic catheter, type 2 diabetes mellitus, essential hypertension, history of ESBL UTI 2018, chronic normocytic anemia who presents from a [facility] for apparent malfunction of suprapubic catheter.  Patient with no specific complaints, denies abdominal pain, no back pain, no nausea/vomiting, no fever/chills.  Per patient, her suprapubic urine catheter has not been functioning well over the last 5 days with little urine coming out.  According to facility there was notable pus coming out from the suprapubic catheter and patient was sent to the ED for further evaluation.  In the ED, temperature 98.4 F, HR 100, RR 16, BP 141/89, SPO2 98% on room air.  Sodium 142, potassium 4.1, chloride 112, CO2 24, glucose 91, BUN 23, creatinine 0.95.  Lactic acid 1.1.  WBC 11.5, hemoglobin 9.9, platelets 413.  COVID-19 PCR negative.  Influenza A/B PCR negative. CT abdomen/pelvis with suprapubic catheter in place with findings likely of ascending infection including bilateral ureteritis and probable right-sided pyelonephritis, questionable focal bladder wall thickening posterior may be due to infection/inflammation with also concern for bladder mass which is unlikely, left nephrolithiasis chronic hydroureter, fibroid uterus.  EDP consulted urology who recommended suprapubic catheter exchange patient was given 1 dose of ceftriaxone.  TRH consulted for further evaluation and management.   Assessment & Plan:   Principal Problem:   Pyelonephritis Active Problems:   Acute pyelonephritis   Acute pyelonephritis, right Urinary tract infection Patient presenting with no urine output from her [facility] for 5 days with associated purulent drainage  surrounding her catheter per SNF staff.  Patient was afebrile with slightly elevated WBC count of 11.5 with normal lactic acid.  No urinalysis/culture were performed prior to initiation of antibiotics in the ED. --WBC 11.5>9.6 --Blood cultures x2: Pending --Urinalysis/urine culture ordered but not collected as of yet --Ceftriaxone 2 g IV q24h --Repeat CBC in a.m.  Suprapubic catheter malfunction Patient presenting from SNF with apparent malfunction of her [facility] for roughly 5 days with reported purulent drainage.  Urology was consulted by EDP who recommended suprapubic catheter exchange.  Serial bladder scans showed no residual urinary retention and patient has been apparently voiding through her urethra and not surrounding her catheter. --Continue to monitor catheter site closely  Update: 24 Notified by RN that suprapubic catheter tip protruding out of patient's urethra.  Assessed at bedside and suprapubic catheter going through abdominal wall with catheter tip and inflated balloon noted at urethral orifice.  Called charge nurse on 4W for catheter exchange.  Type 2 diabetes mellitus Home regimen includes glipizide 5 mg p.o. twice daily, Lantus 10 units subcutaneously daily, metformin 1000 mg p.o. twice daily.  Hemoglobin A1c 6.4, well controlled. --Hold oral hypoglycemics and home Lantus while inpatient --moderate SSI for coverage --CBGs qAC/HS  Hyperlipidemia: Atorvastatin 20 mg p.o. nightly  Peripheral neuropathy: Gabapentin 100 mg p.o. nightly  Glaucoma: Latanoprost 1 drop both eyes nightly  Insomnia: Remeron 7.5 mg p.o. nightly   DVT prophylaxis: enoxaparin (LOVENOX) injection 40 mg Start: 07/08/21 2118   Code Status: Full Code Family Communication: No family present at bedside this morning  Disposition Plan:  Level of care: Med-Surg Status is: Inpatient  Remains inpatient appropriate because: Continues on IV antibiotics, needs close monitoring to ensure no further issues  with suprapubic catheter  that was exchanged.  Anticipate discharge back to [facility] in 1-2 days.     Consultants:  EDP discussed with urology who recommended [facility] exchange  Procedures:  Suprapubic catheter exchange 1/26  Antimicrobials:  Ceftriaxone 1/26>>   Subjective: Patient seen examined at bedside, resting comfortably.  Remains in ED holding area.  No specific complaints this morning.  In good spirits.  Discussion with patient's RN, states that urine coming from her urethra and not surrounding her SPC site which remains dry with dressing in place.  Urine culture/urinalysis unfortunately not obtained prior to initiation of antibiotics.  Continues on ceftriaxone.  No other questions or concerns at this time.  Denies headache, no fever/chills/night sweats, no nausea/vomiting/diarrhea, no chest pain, palpitations, no shortness of breath, no abdominal pain, no cough/congestion.  No acute events overnight per nursing staff.  Objective: Vitals:   07/09/21 0630 07/09/21 0700 07/09/21 0800 07/09/21 1130  BP: 117/67 111/73 114/64 138/79  Pulse: 93 94 90 (!) 103  Resp: 10 17 19  (!) 21  Temp:   98.2 F (36.8 C) 98.2 F (36.8 C)  TempSrc:   Oral Oral  SpO2: 98% 99% 100% 100%    Intake/Output Summary (Last 24 hours) at 07/09/2021 1240 Last data filed at 07/09/2021 0940 Gross per 24 hour  Intake 196.28 ml  Output 0 ml  Net 196.28 ml   There were no vitals filed for this visit.  Examination:  General exam: Appears calm and comfortable, chronically ill appearance Respiratory system: Clear to auscultation. Respiratory effort normal.  On room air Cardiovascular system: S1 & S2 heard, RRR. No JVD, murmurs, rubs, gallops or clicks. No pedal edema. Gastrointestinal system: Abdomen is nondistended, soft and nontender. No organomegaly or masses felt. Normal bowel sounds heard. GU: Suprapubic catheter noted in place, no surrounding fluctuance/erythema, surrounding dressing  clean/dry/intact, small amount of yellow urine in collection bag Central nervous system: Alert and oriented. No focal neurological deficits. Extremities: No peripheral edema Skin: No rashes, lesions or ulcers Psychiatry: Judgement and insight appear normal. Mood & affect appropriate.     Data Reviewed: I have personally reviewed following labs and imaging studies  CBC: Recent Labs  Lab 07/08/21 1325 07/09/21 0428  WBC 11.5* 9.6  NEUTROABS 5.1  --   HGB 9.9* 9.9*  HCT 31.8* 32.8*  MCV 91.9 92.4  PLT 413* 539   Basic Metabolic Panel: Recent Labs  Lab 07/08/21 1325 07/09/21 0428  NA 142 141  K 4.1 4.4  CL 112* 112*  CO2 24 23  GLUCOSE 91 120*  BUN 23 21  CREATININE 0.95 0.96  CALCIUM 11.0* 10.1   GFR: CrCl cannot be calculated (Unknown ideal weight.). Liver Function Tests: No results for input(s): AST, ALT, ALKPHOS, BILITOT, PROT, ALBUMIN in the last 168 hours. No results for input(s): LIPASE, AMYLASE in the last 168 hours. No results for input(s): AMMONIA in the last 168 hours. Coagulation Profile: No results for input(s): INR, PROTIME in the last 168 hours. Cardiac Enzymes: No results for input(s): CKTOTAL, CKMB, CKMBINDEX, TROPONINI in the last 168 hours. BNP (last 3 results) No results for input(s): PROBNP in the last 8760 hours. HbA1C: Recent Labs    07/08/21 1719  HGBA1C 6.4*   CBG: Recent Labs  Lab 07/08/21 2159 07/09/21 0746 07/09/21 1201  GLUCAP 113* 179* 207*   Lipid Profile: No results for input(s): CHOL, HDL, LDLCALC, TRIG, CHOLHDL, LDLDIRECT in the last 72 hours. Thyroid Function Tests: No results for input(s): TSH, T4TOTAL, FREET4, T3FREE, THYROIDAB  in the last 72 hours. Anemia Panel: Recent Labs    07/08/21 2200  FERRITIN 11  TIBC 363  IRON 51  RETICCTPCT 2.1   Sepsis Labs: Recent Labs  Lab 07/08/21 1325  LATICACIDVEN 1.1    Recent Results (from the past 240 hour(s))  Resp Panel by RT-PCR (Flu A&B, Covid) Nasopharyngeal  Swab     Status: None   Collection Time: 07/08/21 11:46 AM   Specimen: Nasopharyngeal Swab; Nasopharyngeal(NP) swabs in vial transport medium  Result Value Ref Range Status   SARS Coronavirus 2 by RT PCR NEGATIVE NEGATIVE Final    Comment: (NOTE) SARS-CoV-2 target nucleic acids are NOT DETECTED.  The SARS-CoV-2 RNA is generally detectable in upper respiratory specimens during the acute phase of infection. The lowest concentration of SARS-CoV-2 viral copies this assay can detect is 138 copies/mL. A negative result does not preclude SARS-Cov-2 infection and should not be used as the sole basis for treatment or other patient management decisions. A negative result may occur with  improper specimen collection/handling, submission of specimen other than nasopharyngeal swab, presence of viral mutation(s) within the areas targeted by this assay, and inadequate number of viral copies(<138 copies/mL). A negative result must be combined with clinical observations, patient history, and epidemiological information. The expected result is Negative.  Fact Sheet for Patients:  EntrepreneurPulse.com.au  Fact Sheet for Healthcare Providers:  IncredibleEmployment.be  This test is no t yet approved or cleared by the [place] FDA and  has been authorized for detection and/or diagnosis of SARS-CoV-2 by FDA under an Emergency Use Authorization (EUA). This EUA will remain  in effect (meaning this test can be used) for the duration of the COVID-19 declaration under Section 564(b)(1) of the Act, 21 U.S.C.section 360bbb-3(b)(1), unless the authorization is terminated  or revoked sooner.       Influenza A by PCR NEGATIVE NEGATIVE Final   Influenza B by PCR NEGATIVE NEGATIVE Final    Comment: (NOTE) The Xpert Xpress SARS-CoV-2/FLU/RSV plus assay is intended as an aid in the diagnosis of influenza from Nasopharyngeal swab specimens and should not be used as a sole  basis for treatment. Nasal washings and aspirates are unacceptable for Xpert Xpress SARS-CoV-2/FLU/RSV testing.  Fact Sheet for Patients: EntrepreneurPulse.com.au  Fact Sheet for Healthcare Providers: IncredibleEmployment.be  This test is not yet approved or cleared by the [place] FDA and has been authorized for detection and/or diagnosis of SARS-CoV-2 by FDA under an Emergency Use Authorization (EUA). This EUA will remain in effect (meaning this test can be used) for the duration of the COVID-19 declaration under Section 564(b)(1) of the Act, 21 U.S.C. section 360bbb-3(b)(1), unless the authorization is terminated or revoked.  Performed at [facility], [place] [address] [place], [place] [address]   Culture, blood (routine x 2)     Status: None (Preliminary result)   Collection Time: 07/08/21  1:25 PM   Specimen: BLOOD  Result Value Ref Range Status   Specimen Description BLOOD BLOOD RIGHT ARM  Final   Special Requests   Final    BOTTLES DRAWN AEROBIC AND ANAEROBIC Blood Culture results may not be optimal due to an inadequate volume of blood received in culture bottles   Culture   Final    NO GROWTH < 24 HOURS Performed at [facility], [place] [address], [place], [place] [address]    Report Status PENDING  Incomplete  Culture, blood (routine x 2)     Status: None (Preliminary result)  Collection Time: 07/08/21  1:25 PM   Specimen: BLOOD  Result Value Ref Range Status   Specimen Description BLOOD SITE NOT SPECIFIED  Final   Special Requests   Final    BOTTLES DRAWN AEROBIC AND ANAEROBIC Blood Culture adequate volume   Culture   Final    NO GROWTH < 24 HOURS Performed at [facility], [place] [address], [place], [place] [address]    Report Status PENDING  Incomplete         Radiology Studies: CT Abdomen Pelvis W Contrast  Result Date: 07/08/2021 CLINICAL DATA:  Peritonitis or perforation suspected. Abdominal  pain. EXAM: CT ABDOMEN AND PELVIS WITH CONTRAST TECHNIQUE: Multidetector CT imaging of the abdomen and pelvis was performed using the standard protocol following bolus administration of intravenous contrast. RADIATION DOSE REDUCTION: This exam was performed according to the departmental dose-optimization program which includes automated exposure control, adjustment of the mA and/or kV according to patient size and/or use of iterative reconstruction technique. CONTRAST:  132mL OMNIPAQUE IOHEXOL 300 MG/ML  SOLN COMPARISON:  12/26/2019 FINDINGS: Lower chest: Bibasilar scarring or subsegmental atelectasis. Normal heart size with trace pericardial fluid. No pleural fluid. Hepatobiliary: Tiny low-density liver lesions are likely cysts. Normal gallbladder, without biliary ductal dilatation. Pancreas: Normal, without mass or ductal dilatation. Spleen: Normal in size, without focal abnormality. Adrenals/Urinary Tract: Normal adrenal glands. Left larger than right renal collecting system calculi of up to approximately 4 mm. Interpolar left renal cyst of 3.7 cm. Bilateral too small to characterize renal lesions. Subtle heterogeneous right renal enhancement, suspicious for pyelonephritis. Both renal collecting systems and proximal ureters demonstrate wall thickening. Moderate bilateral hydronephrosis with mild hydroureter, present on 12/26/2019. Suprapubic catheter with small bladder saccules. Posterior bladder wall thickening including on 81/3, mild. More diffuse minimal bladder wall thickening throughout. Stomach/Bowel: Proximal gastric underdistention. Normal colon, appendix, and terminal ileum. Normal small bowel. Vascular/Lymphatic: Aortic and branch vessel atherosclerosis. Multiple small abdominal retroperitoneal nodes are chronic and likely reactive. Reproductive: Fibroid uterus.  No adnexal mass. Other: No significant free fluid. No free intraperitoneal air. Soft tissue thickening superficial to the right ischial  tuberosity on 87/3, without well-defined ulcer. Musculoskeletal: No acute osseous abnormality. Disc bulges at L4-5 and L5-S1 IMPRESSION: 1. Suprapubic catheter in place with findings likely of ascending infection including bilateral ureteritis and probable right-sided pyelonephritis. Somewhat more focal bladder wall thickening posteriorly may also be due to infection/inflammation. Consider correlation with urinalysis to exclude unlikely bladder mass. 2. Left nephrolithiasis. Chronic caliectasis and hydroureter, possibly secondary to suprapubic catheter. 3. No other explanation for patient's symptoms. 4. Fibroid uterus. 5. Soft tissue thickening superficial the right ischial tuberosity without gross underlying osseous destruction. Consider physical exam correlation. Electronically Signed   By: Abigail Miyamoto M.D.   On: 07/08/2021 15:31        Scheduled Meds:  atorvastatin  20 mg Oral QHS   enoxaparin (LOVENOX) injection  40 mg Subcutaneous Q24H   gabapentin  100 mg Oral QHS   insulin aspart  0-15 Units Subcutaneous TID WC   latanoprost  1 drop Both Eyes QHS   mirabegron ER  25 mg Oral Daily   mirtazapine  7.5 mg Oral QHS   polyethylene glycol  17 g Oral BID   Continuous Infusions:  cefTRIAXone (ROCEPHIN)  IV Stopped (07/09/21 0940)     LOS: 1 day    Time spent: 46 minutes spent on chart review, discussion with nursing staff, consultants, updating family and interview/physical exam; more than 50% of that  time was spent in counseling and/or coordination of care.    Peyson Postema J [place] DO [facility] Available via Epic secure chat 7am-7pm After these hours, please refer to coverage provider listed on [email or web address] 07/09/2021, 12:40 PM

## 2021-07-10 DIAGNOSIS — R532 Functional quadriplegia: Secondary | ICD-10-CM

## 2021-07-10 DIAGNOSIS — Z9359 Other cystostomy status: Secondary | ICD-10-CM

## 2021-07-10 DIAGNOSIS — D649 Anemia, unspecified: Secondary | ICD-10-CM

## 2021-07-10 DIAGNOSIS — N390 Urinary tract infection, site not specified: Secondary | ICD-10-CM

## 2021-07-10 DIAGNOSIS — N319 Neuromuscular dysfunction of bladder, unspecified: Secondary | ICD-10-CM

## 2021-07-10 DIAGNOSIS — R5381 Other malaise: Secondary | ICD-10-CM

## 2021-07-10 DIAGNOSIS — G35 Multiple sclerosis: Secondary | ICD-10-CM

## 2021-07-10 LAB — CBC
HCT: 33.3 % — ABNORMAL LOW (ref 36.0–46.0)
Hemoglobin: 10.4 g/dL — ABNORMAL LOW (ref 12.0–15.0)
MCH: 28.2 pg (ref 26.0–34.0)
MCHC: 31.2 g/dL (ref 30.0–36.0)
MCV: 90.2 fL (ref 80.0–100.0)
Platelets: 374 10*3/uL (ref 150–400)
RBC: 3.69 MIL/uL — ABNORMAL LOW (ref 3.87–5.11)
RDW: 16.8 % — ABNORMAL HIGH (ref 11.5–15.5)
WBC: 11.4 10*3/uL — ABNORMAL HIGH (ref 4.0–10.5)
nRBC: 0 % (ref 0.0–0.2)

## 2021-07-10 LAB — BASIC METABOLIC PANEL
Anion gap: 9 (ref 5–15)
BUN: 23 mg/dL (ref 8–23)
CO2: 21 mmol/L — ABNORMAL LOW (ref 22–32)
Calcium: 10.3 mg/dL (ref 8.9–10.3)
Chloride: 110 mmol/L (ref 98–111)
Creatinine, Ser: 1.15 mg/dL — ABNORMAL HIGH (ref 0.44–1.00)
GFR, Estimated: 54 mL/min — ABNORMAL LOW (ref >60)
Glucose, Bld: 171 mg/dL — ABNORMAL HIGH (ref 70–99)
Potassium: 3.8 mmol/L (ref 3.5–5.1)
Sodium: 140 mmol/L (ref 135–145)

## 2021-07-10 LAB — GLUCOSE, CAPILLARY
Glucose-Capillary: 146 mg/dL — ABNORMAL HIGH (ref 70–99)
Glucose-Capillary: 312 mg/dL — ABNORMAL HIGH (ref 70–99)
Glucose-Capillary: 146 mg/dL — ABNORMAL HIGH (ref 70–99)
Glucose-Capillary: 168 mg/dL — ABNORMAL HIGH (ref 70–99)

## 2021-07-10 NOTE — Progress Notes (Signed)
PROGRESS NOTE  [name] KDT:[id number] DOB: December 26, 1960   PCP: Pcp, No  Patient is from: ALF  DOA: 07/08/2021 LOS: 2  Chief complaints:  Chief Complaint  Patient presents with   urinary catheter     Brief Narrative / Interim history: 61 year old F with PMH of MS with paraplegia, neurogenic bladder with suprapubic catheter, IDDM-2, recurrent UTI/Pilo, ESBL UTI and HTN presented to ED with suprapubic catheter malfunction and purulent drainage from catheter site, and admitted for acute pyelonephritis with suprapubic catheter malfunction.  CT raises concern for right pyelonephritis and bilateral ureteritis.  Per EDP, urology recommended suprapubic catheter exchange and outpatient follow-up.  Suprapubic catheter exchanged.  Urine culture obtained.  Started on IV ceftriaxone.   Subjective: Seen and examined earlier this morning.  No major events overnight of this morning.  She reports bilateral lower extremity pain.  She rates her pain 3/10.  Not able to describe.  She says she was uncomfortable in hospital bed.  No other complaints.  Objective: Vitals:   07/09/21 1954 07/10/21 0107 07/10/21 0509 07/10/21 0937  BP: 131/79 132/83 133/75 126/67  Pulse: (!) 102 95 89 94  Resp: 16 14 18 18   Temp: 98.4 F (36.9 C) 97.9 F (36.6 C) 97.9 F (36.6 C) 98 F (36.7 C)  TempSrc: Oral  Oral Oral  SpO2: 93% 100% 100% 100%  Weight:   65 kg     Examination:  GENERAL: No apparent distress.  Nontoxic. HEENT: MMM.  Vision and hearing grossly intact.  NECK: Supple.  No apparent JVD.  RESP: 100% on RA.  No IWOB.  Fair aeration bilaterally. CVS:  RRR. Heart sounds normal.  ABD/GI/GU: BS+. Abd soft.  Suprapubic Foley catheter in place. MSK/EXT:  Moves LUE.  Not able to move other extremities. SKIN: no apparent skin lesion or wound NEURO: Awake, alert and oriented fairly.  Able to move LUE.  Not able to move other extremities. PSYCH: Calm. Normal affect.   Procedures:  None  Microbiology  summarized: [id number] and influenza PCR nonreactive. Blood culture NGTD. Urine culture reintubated for better growth.  Assessment & Plan: Complicated UTI and acute right pyelonephritis-CT abdomen and pelvis concerning for right pyelonephritis and bilateral ureteritis.  Suprapubic catheter exchanged.  Culture data as above.  History of ESBL in 2018.  -Continue IV ceftriaxone -Follow urine cultures  Neurogenic bladder with suprapubic catheter now with malfunction -Suprapubic catheter replaced -Continue home Myrbetriq -Outpatient follow-up with urology.  Controlled IDDM-2 with hyperglycemia, HLD and neuropathy: A1c 6.4%. Recent Labs  Lab 07/09/21 1201 07/09/21 1730 07/09/21 2107 07/10/21 0756 07/10/21 1132  GLUCAP 207* 129* 173* 146* 312*  -Add basal insulin 10 units daily -Continue SSI-moderate -Continue home statin and gabapentin.  History of MS with functional paraplegia: Only able to move LUE. -Supportive care -PT/OT   Essential hypertension -Continue home meds.   Chronic normocytic anemia: Stable.  Anemia panel suggests iron deficiency. Recent Labs    07/08/21 1325 07/09/21 0428 07/10/21 0144  HGB 9.9* 9.9* 10.4*  -Monitor intermittently -We will start p.o. ferrous sulfate on discharge  Leukocytosis/bandemia: Likely due to #1. -Continue monitoring  Body mass index is 23.85 kg/m.         DVT prophylaxis:  enoxaparin (LOVENOX) injection 40 mg Start: 07/08/21 2118  Code Status: Full code Family Communication: Patient and/or RN. Available if any question.  Level of care: Med-Surg Status is: Inpatient  Remains inpatient appropriate because: For complicated UTI requiring IV antibiotics pending urine culture   Final disposition: Likely  back to ALF once medically stable.    Consultants:  Urology by EDP   Sch Meds:  Scheduled Meds:  atorvastatin  20 mg Oral QHS   enoxaparin (LOVENOX) injection  40 mg Subcutaneous Q24H   gabapentin  100 mg Oral QHS    insulin aspart  0-15 Units Subcutaneous TID WC   latanoprost  1 drop Both Eyes QHS   mirabegron ER  25 mg Oral Daily   mirtazapine  7.5 mg Oral QHS   polyethylene glycol  17 g Oral BID   Continuous Infusions:  cefTRIAXone (ROCEPHIN)  IV 2 g (07/10/21 0902)   PRN Meds:.acetaminophen, albuterol, HYDROcodone-acetaminophen, ondansetron **OR** ondansetron (ZOFRAN) IV  Antimicrobials: Anti-infectives (From admission, onward)    Start     Dose/Rate Route Frequency Ordered Stop   07/09/21 1000  cefTRIAXone (ROCEPHIN) 2 g in sodium chloride 0.9 % 100 mL IVPB        2 g 200 mL/hr over 30 Minutes Intravenous Every 24 hours 07/08/21 1710     07/08/21 1645  cefTRIAXone (ROCEPHIN) 2 g in sodium chloride 0.9 % 100 mL IVPB        2 g 200 mL/hr over 30 Minutes Intravenous  Once 07/08/21 1644 07/08/21 2002        I have personally reviewed the following labs and images: CBC: Recent Labs  Lab 07/08/21 1325 07/09/21 0428 07/10/21 0144  WBC 11.5* 9.6 11.4*  NEUTROABS 5.1  --   --   HGB 9.9* 9.9* 10.4*  HCT 31.8* 32.8* 33.3*  MCV 91.9 92.4 90.2  PLT 413* 385 374   BMP &GFR Recent Labs  Lab 07/08/21 1325 07/09/21 0428 07/10/21 0144  NA 142 141 140  K 4.1 4.4 3.8  CL 112* 112* 110  CO2 24 23 21*  GLUCOSE 91 120* 171*  BUN 23 21 23   CREATININE 0.95 0.96 1.15*  CALCIUM 11.0* 10.1 10.3   CrCl cannot be calculated (Unknown ideal weight.). Liver & Pancreas: No results for input(s): AST, ALT, ALKPHOS, BILITOT, PROT, ALBUMIN in the last 168 hours. No results for input(s): LIPASE, AMYLASE in the last 168 hours. No results for input(s): AMMONIA in the last 168 hours. Diabetic: Recent Labs    07/08/21 1719  HGBA1C 6.4*   Recent Labs  Lab 07/09/21 1201 07/09/21 1730 07/09/21 2107 07/10/21 0756 07/10/21 1132  GLUCAP 207* 129* 173* 146* 312*   Cardiac Enzymes: No results for input(s): CKTOTAL, CKMB, CKMBINDEX, TROPONINI in the last 168 hours. No results for input(s): PROBNP  in the last 8760 hours. Coagulation Profile: No results for input(s): INR, PROTIME in the last 168 hours. Thyroid Function Tests: No results for input(s): TSH, T4TOTAL, FREET4, T3FREE, THYROIDAB in the last 72 hours. Lipid Profile: No results for input(s): CHOL, HDL, LDLCALC, TRIG, CHOLHDL, LDLDIRECT in the last 72 hours. Anemia Panel: Recent Labs    07/08/21 2200  FERRITIN 11  TIBC 363  IRON 51  RETICCTPCT 2.1   Urine analysis:    Component Value Date/Time   COLORURINE YELLOW 07/09/2021 1317   APPEARANCEUR CLOUDY (A) 07/09/2021 1317   LABSPEC 1.029 07/09/2021 1317   PHURINE 5.0 07/09/2021 1317   GLUCOSEU NEGATIVE 07/09/2021 1317   HGBUR MODERATE (A) 07/09/2021 1317   BILIRUBINUR NEGATIVE 07/09/2021 1317   [place] 07/09/2021 1317   PROTEINUR 30 (A) 07/09/2021 1317   UROBILINOGEN 0.2 12/20/2014 1851   NITRITE NEGATIVE 07/09/2021 1317   LEUKOCYTESUR LARGE (A) 07/09/2021 1317   Sepsis Labs: Invalid input(s): PROCALCITONIN, LACTICIDVEN  Microbiology: Recent Results (from the past 240 hour(s))  Resp Panel by RT-PCR (Flu A&B, Covid) Nasopharyngeal Swab     Status: None   Collection Time: 07/08/21 11:46 AM   Specimen: Nasopharyngeal Swab; Nasopharyngeal(NP) swabs in vial transport medium  Result Value Ref Range Status   SARS Coronavirus 2 by RT PCR NEGATIVE NEGATIVE Final    Comment: (NOTE) SARS-CoV-2 target nucleic acids are NOT DETECTED.  The SARS-CoV-2 RNA is generally detectable in upper respiratory specimens during the acute phase of infection. The lowest concentration of SARS-CoV-2 viral copies this assay can detect is 138 copies/mL. A negative result does not preclude SARS-Cov-2 infection and should not be used as the sole basis for treatment or other patient management decisions. A negative result may occur with  improper specimen collection/handling, submission of specimen other than nasopharyngeal swab, presence of viral mutation(s) within the areas  targeted by this assay, and inadequate number of viral copies(<138 copies/mL). A negative result must be combined with clinical observations, patient history, and epidemiological information. The expected result is Negative.  Fact Sheet for Patients:  EntrepreneurPulse.com.au  Fact Sheet for Healthcare Providers:  IncredibleEmployment.be  This test is no t yet approved or cleared by the [place] FDA and  has been authorized for detection and/or diagnosis of SARS-CoV-2 by FDA under an Emergency Use Authorization (EUA). This EUA will remain  in effect (meaning this test can be used) for the duration of the COVID-19 declaration under Section 564(b)(1) of the Act, 21 U.S.C.section 360bbb-3(b)(1), unless the authorization is terminated  or revoked sooner.       Influenza A by PCR NEGATIVE NEGATIVE Final   Influenza B by PCR NEGATIVE NEGATIVE Final    Comment: (NOTE) The Xpert Xpress SARS-CoV-2/FLU/RSV plus assay is intended as an aid in the diagnosis of influenza from Nasopharyngeal swab specimens and should not be used as a sole basis for treatment. Nasal washings and aspirates are unacceptable for Xpert Xpress SARS-CoV-2/FLU/RSV testing.  Fact Sheet for Patients: EntrepreneurPulse.com.au  Fact Sheet for Healthcare Providers: IncredibleEmployment.be  This test is not yet approved or cleared by the [place] FDA and has been authorized for detection and/or diagnosis of SARS-CoV-2 by FDA under an Emergency Use Authorization (EUA). This EUA will remain in effect (meaning this test can be used) for the duration of the COVID-19 declaration under Section 564(b)(1) of the Act, 21 U.S.C. section 360bbb-3(b)(1), unless the authorization is terminated or revoked.  Performed at [facility], [place] [address], [place], [place] [address]   Culture, blood (routine x 2)     Status: None (Preliminary result)    Collection Time: 07/08/21  1:25 PM   Specimen: BLOOD  Result Value Ref Range Status   Specimen Description BLOOD BLOOD RIGHT ARM  Final   Special Requests   Final    BOTTLES DRAWN AEROBIC AND ANAEROBIC Blood Culture results may not be optimal due to an inadequate volume of blood received in culture bottles   Culture   Final    NO GROWTH 2 DAYS Performed at [facility], [place] [address] [place], [place] [address]    Report Status PENDING  Incomplete  Culture, blood (routine x 2)     Status: None (Preliminary result)   Collection Time: 07/08/21  1:25 PM   Specimen: BLOOD  Result Value Ref Range Status   Specimen Description BLOOD SITE NOT SPECIFIED  Final   Special Requests   Final    BOTTLES DRAWN AEROBIC AND ANAEROBIC  Blood Culture adequate volume   Culture   Final    NO GROWTH 2 DAYS Performed at [facility], [place] [address], [place], [place] [address]    Report Status PENDING  Incomplete  Urine Culture     Status: None (Preliminary result)   Collection Time: 07/09/21  1:37 PM   Specimen: Urine, Suprapubic  Result Value Ref Range Status   Specimen Description URINE, SUPRAPUBIC  Final   Special Requests NONE  Final   Culture   Final    CULTURE REINCUBATED FOR BETTER GROWTH Performed at [facility], [place] [address], [place], [place] [address]    Report Status PENDING  Incomplete    Radiology Studies: No results found.     Melton Walls T. [place]  If 7PM-7AM, please contact night-coverage [email or web address] 07/10/2021, 12:55 PM

## 2021-07-11 ENCOUNTER — Other Ambulatory Visit: Payer: Self-pay

## 2021-07-11 ENCOUNTER — Encounter (HOSPITAL_COMMUNITY): Payer: Self-pay | Admitting: Internal Medicine

## 2021-07-11 LAB — CBC WITH DIFFERENTIAL/PLATELET
Abs Immature Granulocytes: 0.03 10*3/uL (ref 0.00–0.07)
Basophils Absolute: 0.1 10*3/uL (ref 0.0–0.1)
Basophils Relative: 1 %
Eosinophils Absolute: 0.4 10*3/uL (ref 0.0–0.5)
Eosinophils Relative: 4 %
HCT: 30.5 % — ABNORMAL LOW (ref 36.0–46.0)
Hemoglobin: 9.4 g/dL — ABNORMAL LOW (ref 12.0–15.0)
Immature Granulocytes: 0 %
Lymphocytes Relative: 39 %
Lymphs Abs: 4.3 10*3/uL — ABNORMAL HIGH (ref 0.7–4.0)
MCH: 28.2 pg (ref 26.0–34.0)
MCHC: 30.8 g/dL (ref 30.0–36.0)
MCV: 91.6 fL (ref 80.0–100.0)
Monocytes Absolute: 1 10*3/uL (ref 0.1–1.0)
Monocytes Relative: 9 %
Neutro Abs: 5.2 10*3/uL (ref 1.7–7.7)
Neutrophils Relative %: 47 %
Platelets: 345 10*3/uL (ref 150–400)
RBC: 3.33 MIL/uL — ABNORMAL LOW (ref 3.87–5.11)
RDW: 16.5 % — ABNORMAL HIGH (ref 11.5–15.5)
WBC: 10.9 10*3/uL — ABNORMAL HIGH (ref 4.0–10.5)
nRBC: 0 % (ref 0.0–0.2)

## 2021-07-11 LAB — RENAL FUNCTION PANEL
Albumin: 3.1 g/dL — ABNORMAL LOW (ref 3.5–5.0)
Anion gap: 7 (ref 5–15)
BUN: 19 mg/dL (ref 8–23)
CO2: 20 mmol/L — ABNORMAL LOW (ref 22–32)
Calcium: 9.8 mg/dL (ref 8.9–10.3)
Chloride: 109 mmol/L (ref 98–111)
Creatinine, Ser: 0.93 mg/dL (ref 0.44–1.00)
GFR, Estimated: >60 mL/min (ref >60)
Glucose, Bld: 241 mg/dL — ABNORMAL HIGH (ref 70–99)
Phosphorus: 2.8 mg/dL (ref 2.5–4.6)
Potassium: 3.6 mmol/L (ref 3.5–5.1)
Sodium: 136 mmol/L (ref 135–145)

## 2021-07-11 LAB — MAGNESIUM: Magnesium: 1.5 mg/dL — ABNORMAL LOW (ref 1.7–2.4)

## 2021-07-11 LAB — GLUCOSE, CAPILLARY
Glucose-Capillary: 156 mg/dL — ABNORMAL HIGH (ref 70–99)
Glucose-Capillary: 150 mg/dL — ABNORMAL HIGH (ref 70–99)
Glucose-Capillary: 158 mg/dL — ABNORMAL HIGH (ref 70–99)
Glucose-Capillary: 165 mg/dL — ABNORMAL HIGH (ref 70–99)

## 2021-07-11 MED ORDER — SENNOSIDES-DOCUSATE SODIUM 8.6-50 MG PO TABS
1.0000 | ORAL_TABLET | Freq: Two times a day (BID) | ORAL | Status: AC
  Administered 2021-07-11 (×2): 1 via ORAL
  Filled 2021-07-11 (×2): qty 1

## 2021-07-11 MED ORDER — SENNOSIDES-DOCUSATE SODIUM 8.6-50 MG PO TABS: 1.0000 | ORAL_TABLET | Freq: Two times a day (BID) | ORAL | Status: DC | PRN

## 2021-07-11 MED ORDER — SODIUM CHLORIDE 0.9 % IV SOLN
3.0000 g | Freq: Four times a day (QID) | INTRAVENOUS | Status: DC
  Administered 2021-07-11 – 2021-07-12 (×4): 3 g via INTRAVENOUS
  Filled 2021-07-11 (×6): qty 8

## 2021-07-11 NOTE — Progress Notes (Signed)
PROGRESS NOTE  [name] XTK:[id number] DOB: [birth year]   PCP: Pcp, No  Patient is from: ALF  DOA: 07/08/2021 LOS: 3  Chief complaints:  Chief Complaint  Patient presents with   urinary catheter     Brief Narrative / Interim history: 61 year old F with PMH of MS with paraplegia, neurogenic bladder with suprapubic catheter, IDDM-2, recurrent UTI/Pilo, ESBL UTI and HTN presented to ED with suprapubic catheter malfunction and purulent drainage from catheter site, and admitted for acute pyelonephritis with suprapubic catheter malfunction.  CT raises concern for right pyelonephritis and bilateral ureteritis.  Per EDP, urology recommended suprapubic catheter exchange and outpatient follow-up.  Suprapubic catheter exchanged.  Urine culture obtained.  Started on IV ceftriaxone.   Urine culture with polymicrobial including 50,000 Enterococcus faecalis, 10,000 Klebsiella pneumonia and 1000 E. Coli.  Susceptibilities pending.  Changed ceftriaxone to IV Unasyn.  Subjective: Seen and examined earlier this morning.  No major events overnight of this morning.  No complaints.  Eager to go back to ALF.  Has not had a bowel movement yet.  Objective: Vitals:   07/10/21 1723 07/10/21 2049 07/11/21 0448 07/11/21 0851  BP: 135/73 132/73 130/71 123/72  Pulse: 99 93 97 (!) 103  Resp: 11 18 18 16   Temp: 99 F (37.2 C) 98.8 F (37.1 C) 99 F (37.2 C) 99.1 F (37.3 C)  TempSrc: Oral Oral Oral Oral  SpO2: 96% 96% 96% 99%  Weight:   65.4 kg     Examination:  GENERAL: No apparent distress.  Nontoxic. HEENT: MMM.  Vision and hearing grossly intact.  NECK: Supple.  No apparent JVD.  RESP: 99% on RA.  No IWOB.  Fair aeration bilaterally. CVS:  RRR. Heart sounds normal.  ABD/GI/GU: BS+. Abd soft.  Suprapubic Foley catheter in place. MSK/EXT: Moves LUE.  Not able to move other extremities. SKIN: no apparent skin lesion or wound NEURO: Awake and alert. Oriented appropriately.  Only able to move  LUE. PSYCH: Calm. Normal affect.   Procedures:  None  Microbiology summarized: [id number] and influenza PCR nonreactive. Blood culture NGTD. Urine culture with 50,000 E.  Faecalis, 10,000 K.  Pneumonia, and 1000 E. coli  Assessment & Plan: Complicated UTI and acute right pyelonephritis-CT abdomen and pelvis concerning for right pyelonephritis and bilateral ureteritis.  Suprapubic catheter exchanged.  Urine culture polymicrobial favoring colonization but cannot rule out true infection.  History of ESBL in 2018.  -Change IV ceftriaxone to IV Unasyn -Follow urine culture susceptibilities  Neurogenic bladder with suprapubic catheter now with malfunction -Suprapubic catheter replaced -Continue home Myrbetriq -Outpatient follow-up with urology.  Controlled IDDM-2 with hyperglycemia, HLD and neuropathy: A1c 6.4%. Recent Labs  Lab 07/10/21 1132 07/10/21 1654 07/10/21 2050 07/11/21 0842 07/11/21 1134  GLUCAP 312* 146* 168* 156* 150*  -Continue basal insulin 10 units daily -Continue SSI-moderate -Continue home statin and gabapentin.  History of MS with functional paraplegia: Only able to move LUE. -Supportive care -PT/OT   Essential hypertension -Continue home meds.   Chronic normocytic anemia: Stable.  Anemia panel suggests iron deficiency. Recent Labs    07/08/21 1325 07/09/21 0428 07/10/21 0144 07/11/21 0310  HGB 9.9* 9.9* 10.4* 9.4*  -Monitor intermittently -Start p.o. ferrous sulfate on discharge  Leukocytosis/bandemia: Likely due to #1. -Continue monitoring  Constipation -Bowel regimen-MiraLAX and Senokot-S  Body mass index is 23.99 kg/m.         DVT prophylaxis:  enoxaparin (LOVENOX) injection 40 mg Start: 07/08/21 2118  Code Status: Full code Family Communication: Patient and/or  RN. Available if any question.  Level of care: Med-Surg Status is: Inpatient  Remains inpatient appropriate because: For complicated UTI requiring IV antibiotics pending  urine culture sensitivity   Final disposition: Likely back to ALF once medically stable.    Consultants:  Urology by EDP   Sch Meds:  Scheduled Meds:  atorvastatin  20 mg Oral QHS   enoxaparin (LOVENOX) injection  40 mg Subcutaneous Q24H   gabapentin  100 mg Oral QHS   insulin aspart  0-15 Units Subcutaneous TID WC   latanoprost  1 drop Both Eyes QHS   mirabegron ER  25 mg Oral Daily   mirtazapine  7.5 mg Oral QHS   polyethylene glycol  17 g Oral BID   senna-docusate  1 tablet Oral BID   Continuous Infusions:  ampicillin-sulbactam (UNASYN) IV 3 g (07/11/21 0951)   PRN Meds:.acetaminophen, albuterol, HYDROcodone-acetaminophen, ondansetron **OR** ondansetron (ZOFRAN) IV, senna-docusate **FOLLOWED BY** [START ON 07/12/2021] senna-docusate  Antimicrobials: Anti-infectives (From admission, onward)    Start     Dose/Rate Route Frequency Ordered Stop   07/11/21 0900  Ampicillin-Sulbactam (UNASYN) 3 g in sodium chloride 0.9 % 100 mL IVPB        3 g 200 mL/hr over 30 Minutes Intravenous Every 6 hours 07/11/21 0807     07/09/21 1000  cefTRIAXone (ROCEPHIN) 2 g in sodium chloride 0.9 % 100 mL IVPB  Status:  Discontinued        2 g 200 mL/hr over 30 Minutes Intravenous Every 24 hours 07/08/21 1710 07/11/21 0807   07/08/21 1645  cefTRIAXone (ROCEPHIN) 2 g in sodium chloride 0.9 % 100 mL IVPB        2 g 200 mL/hr over 30 Minutes Intravenous  Once 07/08/21 1644 07/08/21 2002        I have personally reviewed the following labs and images: CBC: Recent Labs  Lab 07/08/21 1325 07/09/21 0428 07/10/21 0144 07/11/21 0310  WBC 11.5* 9.6 11.4* 10.9*  NEUTROABS 5.1  --   --  5.2  HGB 9.9* 9.9* 10.4* 9.4*  HCT 31.8* 32.8* 33.3* 30.5*  MCV 91.9 92.4 90.2 91.6  PLT 413* 385 374 345   BMP &GFR Recent Labs  Lab 07/08/21 1325 07/09/21 0428 07/10/21 0144 07/11/21 0310  NA 142 141 140 136  K 4.1 4.4 3.8 3.6  CL 112* 112* 110 109  CO2 24 23 21* 20*  GLUCOSE 91 120* 171* 241*   BUN 23 21 23 19   CREATININE 0.95 0.96 1.15* 0.93  CALCIUM 11.0* 10.1 10.3 9.8  MG  --   --   --  1.5*  PHOS  --   --   --  2.8   CrCl cannot be calculated (Unknown ideal weight.). Liver & Pancreas: Recent Labs  Lab 07/11/21 0310  ALBUMIN 3.1*   No results for input(s): LIPASE, AMYLASE in the last 168 hours. No results for input(s): AMMONIA in the last 168 hours. Diabetic: Recent Labs    07/08/21 1719  HGBA1C 6.4*   Recent Labs  Lab 07/10/21 1132 07/10/21 1654 07/10/21 2050 07/11/21 0842 07/11/21 1134  GLUCAP 312* 146* 168* 156* 150*   Cardiac Enzymes: No results for input(s): CKTOTAL, CKMB, CKMBINDEX, TROPONINI in the last 168 hours. No results for input(s): PROBNP in the last 8760 hours. Coagulation Profile: No results for input(s): INR, PROTIME in the last 168 hours. Thyroid Function Tests: No results for input(s): TSH, T4TOTAL, FREET4, T3FREE, THYROIDAB in the last 72 hours. Lipid Profile: No results for  input(s): CHOL, HDL, LDLCALC, TRIG, CHOLHDL, LDLDIRECT in the last 72 hours. Anemia Panel: Recent Labs    07/08/21 2200  FERRITIN 11  TIBC 363  IRON 51  RETICCTPCT 2.1   Urine analysis:    Component Value Date/Time   COLORURINE YELLOW 07/09/2021 1317   APPEARANCEUR CLOUDY (A) 07/09/2021 1317   LABSPEC 1.029 07/09/2021 1317   PHURINE 5.0 07/09/2021 1317   GLUCOSEU NEGATIVE 07/09/2021 1317   HGBUR MODERATE (A) 07/09/2021 1317   BILIRUBINUR NEGATIVE 07/09/2021 1317   [place] 07/09/2021 1317   PROTEINUR 30 (A) 07/09/2021 1317   UROBILINOGEN 0.2 12/20/2014 1851   NITRITE NEGATIVE 07/09/2021 1317   LEUKOCYTESUR LARGE (A) 07/09/2021 1317   Sepsis Labs: Invalid input(s): PROCALCITONIN, [place]  Microbiology: Recent Results (from the past 240 hour(s))  Resp Panel by RT-PCR (Flu A&B, Covid) Nasopharyngeal Swab     Status: None   Collection Time: 07/08/21 11:46 AM   Specimen: Nasopharyngeal Swab; Nasopharyngeal(NP) swabs in vial  transport medium  Result Value Ref Range Status   SARS Coronavirus 2 by RT PCR NEGATIVE NEGATIVE Final    Comment: (NOTE) SARS-CoV-2 target nucleic acids are NOT DETECTED.  The SARS-CoV-2 RNA is generally detectable in upper respiratory specimens during the acute phase of infection. The lowest concentration of SARS-CoV-2 viral copies this assay can detect is 138 copies/mL. A negative result does not preclude SARS-Cov-2 infection and should not be used as the sole basis for treatment or other patient management decisions. A negative result may occur with  improper specimen collection/handling, submission of specimen other than nasopharyngeal swab, presence of viral mutation(s) within the areas targeted by this assay, and inadequate number of viral copies(<138 copies/mL). A negative result must be combined with clinical observations, patient history, and epidemiological information. The expected result is Negative.  Fact Sheet for Patients:  EntrepreneurPulse.com.au  Fact Sheet for Healthcare Providers:  IncredibleEmployment.be  This test is no t yet approved or cleared by the [place] FDA and  has been authorized for detection and/or diagnosis of SARS-CoV-2 by FDA under an Emergency Use Authorization (EUA). This EUA will remain  in effect (meaning this test can be used) for the duration of the COVID-19 declaration under Section 564(b)(1) of the Act, 21 U.S.C.section 360bbb-3(b)(1), unless the authorization is terminated  or revoked sooner.       Influenza A by PCR NEGATIVE NEGATIVE Final   Influenza B by PCR NEGATIVE NEGATIVE Final    Comment: (NOTE) The Xpert Xpress SARS-CoV-2/FLU/RSV plus assay is intended as an aid in the diagnosis of influenza from Nasopharyngeal swab specimens and should not be used as a sole basis for treatment. Nasal washings and aspirates are unacceptable for Xpert Xpress SARS-CoV-2/FLU/RSV testing.  Fact  Sheet for Patients: EntrepreneurPulse.com.au  Fact Sheet for Healthcare Providers: IncredibleEmployment.be  This test is not yet approved or cleared by the [place] FDA and has been authorized for detection and/or diagnosis of SARS-CoV-2 by FDA under an Emergency Use Authorization (EUA). This EUA will remain in effect (meaning this test can be used) for the duration of the COVID-19 declaration under Section 564(b)(1) of the Act, 21 U.S.C. section 360bbb-3(b)(1), unless the authorization is terminated or revoked.  Performed at [facility], [place] [address], [place], [place] [address]   Culture, blood (routine x 2)     Status: None (Preliminary result)   Collection Time: 07/08/21  1:25 PM   Specimen: BLOOD  Result Value Ref Range Status   Specimen Description BLOOD BLOOD  RIGHT ARM  Final   Special Requests   Final    BOTTLES DRAWN AEROBIC AND ANAEROBIC Blood Culture results may not be optimal due to an inadequate volume of blood received in culture bottles   Culture   Final    NO GROWTH 3 DAYS Performed at [facility], [place] [address] [place], [place] [address]    Report Status PENDING  Incomplete  Culture, blood (routine x 2)     Status: None (Preliminary result)   Collection Time: 07/08/21  1:25 PM   Specimen: BLOOD  Result Value Ref Range Status   Specimen Description BLOOD SITE NOT SPECIFIED  Final   Special Requests   Final    BOTTLES DRAWN AEROBIC AND ANAEROBIC Blood Culture adequate volume   Culture   Final    NO GROWTH 3 DAYS Performed at [facility], [address], [place], [place] [address]    Report Status PENDING  Incomplete  Urine Culture     Status: Abnormal (Preliminary result)   Collection Time: 07/09/21  1:37 PM   Specimen: Urine, Suprapubic  Result Value Ref Range Status   Specimen Description URINE, SUPRAPUBIC  Final   Special Requests NONE  Final   Culture (A)  Final    50,000 COLONIES/mL  ENTEROCOCCUS FAECALIS 10,000 COLONIES/mL KLEBSIELLA PNEUMONIAE 1,000 COLONIES/mL ESCHERICHIA COLI SUSCEPTIBILITIES TO FOLLOW Performed at [facility], [address] [place], [place] [address]    Report Status PENDING  Incomplete    Radiology Studies: No results found.     Bryker Fletchall T. [place]  If 7PM-7AM, please contact night-coverage [email or web address] 07/11/2021, 12:00 PM

## 2021-07-12 DIAGNOSIS — T83010A Breakdown (mechanical) of cystostomy catheter, initial encounter: Secondary | ICD-10-CM

## 2021-07-12 DIAGNOSIS — D509 Iron deficiency anemia, unspecified: Secondary | ICD-10-CM

## 2021-07-12 DIAGNOSIS — D72825 Bandemia: Secondary | ICD-10-CM

## 2021-07-12 DIAGNOSIS — E118 Type 2 diabetes mellitus with unspecified complications: Secondary | ICD-10-CM

## 2021-07-12 LAB — CBC
HCT: 28.6 % — ABNORMAL LOW (ref 36.0–46.0)
Hemoglobin: 8.8 g/dL — ABNORMAL LOW (ref 12.0–15.0)
MCH: 28.3 pg (ref 26.0–34.0)
MCHC: 30.8 g/dL (ref 30.0–36.0)
MCV: 92 fL (ref 80.0–100.0)
Platelets: 341 10*3/uL (ref 150–400)
RBC: 3.11 MIL/uL — ABNORMAL LOW (ref 3.87–5.11)
RDW: 16.6 % — ABNORMAL HIGH (ref 11.5–15.5)
WBC: 13.7 10*3/uL — ABNORMAL HIGH (ref 4.0–10.5)
nRBC: 0 % (ref 0.0–0.2)

## 2021-07-12 LAB — RENAL FUNCTION PANEL
Albumin: 3 g/dL — ABNORMAL LOW (ref 3.5–5.0)
Anion gap: 9 (ref 5–15)
BUN: 18 mg/dL (ref 8–23)
CO2: 20 mmol/L — ABNORMAL LOW (ref 22–32)
Calcium: 9.7 mg/dL (ref 8.9–10.3)
Chloride: 109 mmol/L (ref 98–111)
Creatinine, Ser: 1.11 mg/dL — ABNORMAL HIGH (ref 0.44–1.00)
GFR, Estimated: 56 mL/min — ABNORMAL LOW (ref >60)
Glucose, Bld: 209 mg/dL — ABNORMAL HIGH (ref 70–99)
Phosphorus: 2.7 mg/dL (ref 2.5–4.6)
Potassium: 3.7 mmol/L (ref 3.5–5.1)
Sodium: 138 mmol/L (ref 135–145)

## 2021-07-12 LAB — MAGNESIUM: Magnesium: 1.5 mg/dL — ABNORMAL LOW (ref 1.7–2.4)

## 2021-07-12 LAB — GLUCOSE, CAPILLARY
Glucose-Capillary: 171 mg/dL — ABNORMAL HIGH (ref 70–99)
Glucose-Capillary: 164 mg/dL — ABNORMAL HIGH (ref 70–99)

## 2021-07-12 MED ORDER — PIPERACILLIN-TAZOBACTAM 3.375 G IVPB
3.3750 g | Freq: Three times a day (TID) | INTRAVENOUS | Status: DC
  Filled 2021-07-12: qty 50

## 2021-07-12 MED ORDER — MAGNESIUM SULFATE 2 GM/50ML IV SOLN
2.0000 g | Freq: Once | INTRAVENOUS | Status: AC
  Administered 2021-07-12: 2 g via INTRAVENOUS
  Filled 2021-07-12: qty 50

## 2021-07-12 NOTE — Progress Notes (Addendum)
Pt was on ceftriaxone before transitioning to Unasyn for her complicated UTI with mixed cultures. He foley has been changed. CT suggested possible infection. D/w Dr Cyndia Skeeters and we will change unasyn to Zosyn to cover pseudomonas, enterococcus, klebsiella for 5d of therapy.   Onnie Boer, PharmD, BCIDP, AAHIVP, CPP Infectious Disease Pharmacist 07/12/2021 9:39 AM  Addendum  Dr. Cyndia Skeeters decided that we will stop abx at this point.   Onnie Boer, PharmD, BCIDP, AAHIVP, CPP Infectious Disease Pharmacist 07/12/2021 9:47 AM

## 2021-07-12 NOTE — Progress Notes (Signed)
DISCHARGE NOTE HOME [name] to be discharged Skilled nursing facility per MD order. Discussed prescriptions and follow up appointments with the patient. Prescriptions given to patient; medication list explained in detail. Patient verbalized understanding.  Skin clean, dry and intact without evidence of skin break down, no evidence of skin tears noted. IV catheter discontinued intact. Site without signs and symptoms of complications. Dressing and pressure applied. Pt denies pain at the site currently. No complaints noted.  Patient free of lines, drains, and wounds.   An After Visit Summary (AVS) was printed and given to the patient. Patient escorted via stretcher, and discharged home via [place].  Vira Agar, RN

## 2021-07-12 NOTE — Discharge Summary (Signed)
Physician Discharge Summary  [name]:[id number] DOB: Nov 16, 1960 DOA: 07/08/2021  PCP: Pcp, No  Admit date: 07/08/2021 Discharge date: 07/12/2021 Admitted From: ALF  Disposition: ALF Recommendations for Outpatient Follow-up:  Follow ups as below. Please obtain CBC/BMP/Mag at follow up Please follow up on the following pending results: None Home Health: PT/OT Equipment/Devices: None Discharge Condition: Stable CODE STATUS: Full code  Hospital Course: 61 year old F with PMH of MS with paraplegia, neurogenic bladder with suprapubic catheter, IDDM-2, recurrent UTI/Pilo, ESBL UTI and HTN presented to ED with suprapubic catheter malfunction and purulent drainage from catheter site, and admitted for acute pyelonephritis with suprapubic catheter malfunction.  CT raises concern for right pyelonephritis and bilateral ureteritis.  Per EDP, urology recommended suprapubic catheter exchange and outpatient follow-up.  Suprapubic catheter exchanged.  Urine culture obtained from chronic Foley.  Started on IV ceftriaxone.    Patient remained stable.  Urine culture with polymicrobial including 50,000 VRE, 10,000 Klebsiella pneumonia and 1000 E. Coli and 1000 Pseudomonas suggesting colonization from chronic Foley versus true infection.  Antibiotic discontinued.  She is discharged back to ALF.   See individual problem list below for more on hospital course.  Discharge Diagnoses:  Neurogenic bladder with suprapubic catheter now with malfunction-initially concerned about right pyelonephritis and complicated UTI.  Suprapubic catheter exchanged.  Urine culture polymicrobial as above likely colonization versus true infection.  Patient remained stable clinically without fever.  Antibiotics discontinued.  -Suprapubic catheter replaced -Outpatient follow-up with urology.  UTI/pyelonephritis ruled out-CT abdomen and pelvis concerning for bilateral uretritis and probable right-sided pyelonephritis.  However,  patient has no clinical signs of pyelonephritis.  Urine culture from chronic Foley with polymicrobial growth likely colonization versus true infection. -Discontinued antibiotics   Controlled IDDM-2 with hyperglycemia, HLD and neuropathy: A1c 6.4%. -Continue home metformin and glipizide.   History of MS with functional paraplegia: Only able to move LUE. -Supportive care -PT/OT   Essential hypertension: Normotensive.  Does not seem to be medication.   Chronic normocytic anemia: Stable.  Anemia panel suggests iron deficiency. -Recheck CBC at follow-up. -Continue p.o. ferrous sulfate   Mild leukocytosis/bandemia:  -Recheck CBC at follow-up.  Hypomagnesemia -Replenished prior to discharge.   Constipation -Bowel regimen-MiraLAX and Senokot-S  Body mass index is 23.99 kg/m.           Discharge Exam: Vitals:   07/11/21 0851 07/11/21 1622 07/11/21 2017 07/12/21 0638  BP: 123/72 115/69 137/75 129/69  Pulse: (!) 103 100 (!) 106 97  Temp: 99.1 F (37.3 C) 98.3 F (36.8 C) 99 F (37.2 C) 98.1 F (36.7 C)  Resp: 16 18 18 18   Height: 5\' 5"  (1.651 m)     Weight:      SpO2: 99% 98% 99% 100%  TempSrc: Oral Oral Oral Oral     GENERAL: No apparent distress.  Nontoxic. HEENT: MMM.  Vision and hearing grossly intact.  NECK: Supple.  No apparent JVD.  RESP: Under percent on RA.  No IWOB.  Fair aeration bilaterally. CVS:  RRR. Heart sounds normal.  ABD/GI/GU: Bowel sounds present. Soft. Non tender.  Suprapubic Foley. MSK/EXT: Only moves LUE. SKIN: no apparent skin lesion or wound NEURO: Awake and alert.  Oriented appropriately.  Only moves LUE. PSYCH: Calm. Normal affect.   Discharge Instructions  Discharge Instructions     Diet general   Complete by: As directed    Increase activity slowly   Complete by: As directed       Allergies as of 07/12/2021  No Known Allergies      Medication List     TAKE these medications    acetaminophen 325 MG tablet Commonly  known as: TYLENOL Take 2 tablets (650 mg total) by mouth every 6 (six) hours as needed for fever. What changed: reasons to take this   acetic acid 0.25 % irrigation Irrigate with as directed See admin instructions. 60 ml's two times a day as needed for catheter sediment   albuterol (2.5 MG/3ML) 0.083% nebulizer solution Commonly known as: PROVENTIL Take 3 mLs (2.5 mg total) by nebulization every 6 (six) hours as needed for wheezing or shortness of breath.   atorvastatin 20 MG tablet Commonly known as: LIPITOR Take 20 mg by mouth at bedtime. What changed: Another medication with the same name was removed. Continue taking this medication, and follow the directions you see here.   doxycycline 100 MG tablet Commonly known as: VIBRA-TABS Take 100 mg by mouth daily.   famotidine 20 MG tablet Commonly known as: Pepcid Take 1 tablet (20 mg total) by mouth 2 (two) times daily.   ferrous sulfate 325 (65 FE) MG EC tablet Take 325 mg by mouth 2 (two) times daily.   gabapentin 100 MG capsule Commonly known as: NEURONTIN Take 1 capsule (100 mg total) by mouth at bedtime.   glipiZIDE 5 MG tablet Commonly known as: GLUCOTROL Take 5 mg by mouth in the morning and at bedtime.   HYDROcodone-acetaminophen 5-325 MG tablet Commonly known as: NORCO/VICODIN Take 1 tablet by mouth every 6 (six) hours as needed for moderate pain.   insulin glargine 100 UNIT/ML injection Commonly known as: LANTUS Inject 10 Units into the skin in the morning.   latanoprost 0.005 % ophthalmic solution Commonly known as: XALATAN Place 1 drop into both eyes at bedtime.   metFORMIN 1000 MG tablet Commonly known as: GLUCOPHAGE Take 1,000 mg by mouth in the morning and at bedtime. What changed: Another medication with the same name was removed. Continue taking this medication, and follow the directions you see here.   mirtazapine 7.5 MG tablet Commonly known as: REMERON Take 7.5 mg by mouth at bedtime.    Myrbetriq 25 MG Tb24 tablet Generic drug: mirabegron ER Take 25 mg by mouth daily.   neomycin-bacitracin-polymyxin Oint Commonly known as: NEOSPORIN Apply 1 application topically 2 (two) times daily. To neck area and upper chest   NON FORMULARY Take 360 mLs by mouth See admin instructions. Sugar-free house supplement: Drink 360 ml's by mouth once a day   nystatin cream Commonly known as: MYCOSTATIN Apply 1 application topically See admin instructions. Apply to perineal area 2 times a day as needed for candidiasis   ondansetron 4 MG tablet Commonly known as: ZOFRAN Take 4 mg by mouth every 8 (eight) hours as needed for nausea or vomiting.   oxybutynin 5 MG tablet Commonly known as: DITROPAN Take 5 mg by mouth 2 (two) times daily as needed for bladder spasms.   polyethylene glycol 17 g packet Commonly known as: MIRALAX / GLYCOLAX Take 17 g by mouth 2 (two) times daily.   potassium chloride 10 MEQ tablet Commonly known as: KLOR-CON M Take 10 mEq by mouth daily.   sennosides-docusate sodium 8.6-50 MG tablet Commonly known as: SENOKOT-S Take 1 tablet by mouth at bedtime.   Vitamin D3 1.25 MG (50000 UT) Caps Take 50,000 Units by mouth every Tuesday.        Consultations: None  Procedures/Studies   CT Abdomen Pelvis W Contrast  Result Date:  07/08/2021 CLINICAL DATA:  Peritonitis or perforation suspected. Abdominal pain. EXAM: CT ABDOMEN AND PELVIS WITH CONTRAST TECHNIQUE: Multidetector CT imaging of the abdomen and pelvis was performed using the standard protocol following bolus administration of intravenous contrast. RADIATION DOSE REDUCTION: This exam was performed according to the departmental dose-optimization program which includes automated exposure control, adjustment of the mA and/or kV according to patient size and/or use of iterative reconstruction technique. CONTRAST:  127mL OMNIPAQUE IOHEXOL 300 MG/ML  SOLN COMPARISON:  12/26/2019 FINDINGS: Lower chest:  Bibasilar scarring or subsegmental atelectasis. Normal heart size with trace pericardial fluid. No pleural fluid. Hepatobiliary: Tiny low-density liver lesions are likely cysts. Normal gallbladder, without biliary ductal dilatation. Pancreas: Normal, without mass or ductal dilatation. Spleen: Normal in size, without focal abnormality. Adrenals/Urinary Tract: Normal adrenal glands. Left larger than right renal collecting system calculi of up to approximately 4 mm. Interpolar left renal cyst of 3.7 cm. Bilateral too small to characterize renal lesions. Subtle heterogeneous right renal enhancement, suspicious for pyelonephritis. Both renal collecting systems and proximal ureters demonstrate wall thickening. Moderate bilateral hydronephrosis with mild hydroureter, present on 12/26/2019. Suprapubic catheter with small bladder saccules. Posterior bladder wall thickening including on 81/3, mild. More diffuse minimal bladder wall thickening throughout. Stomach/Bowel: Proximal gastric underdistention. Normal colon, appendix, and terminal ileum. Normal small bowel. Vascular/Lymphatic: Aortic and branch vessel atherosclerosis. Multiple small abdominal retroperitoneal nodes are chronic and likely reactive. Reproductive: Fibroid uterus.  No adnexal mass. Other: No significant free fluid. No free intraperitoneal air. Soft tissue thickening superficial to the right ischial tuberosity on 87/3, without well-defined ulcer. Musculoskeletal: No acute osseous abnormality. Disc bulges at L4-5 and L5-S1 IMPRESSION: 1. Suprapubic catheter in place with findings likely of ascending infection including bilateral ureteritis and probable right-sided pyelonephritis. Somewhat more focal bladder wall thickening posteriorly may also be due to infection/inflammation. Consider correlation with urinalysis to exclude unlikely bladder mass. 2. Left nephrolithiasis. Chronic caliectasis and hydroureter, possibly secondary to suprapubic catheter. 3. No  other explanation for patient's symptoms. 4. Fibroid uterus. 5. Soft tissue thickening superficial the right ischial tuberosity without gross underlying osseous destruction. Consider physical exam correlation. Electronically Signed   By: Abigail Miyamoto M.D.   On: 07/08/2021 15:31       The results of significant diagnostics from this hospitalization (including imaging, microbiology, ancillary and laboratory) are listed below for reference.     Microbiology: Recent Results (from the past 240 hour(s))  Resp Panel by RT-PCR (Flu A&B, Covid) Nasopharyngeal Swab     Status: None   Collection Time: 07/08/21 11:46 AM   Specimen: Nasopharyngeal Swab; Nasopharyngeal(NP) swabs in vial transport medium  Result Value Ref Range Status   SARS Coronavirus 2 by RT PCR NEGATIVE NEGATIVE Final    Comment: (NOTE) SARS-CoV-2 target nucleic acids are NOT DETECTED.  The SARS-CoV-2 RNA is generally detectable in upper respiratory specimens during the acute phase of infection. The lowest concentration of SARS-CoV-2 viral copies this assay can detect is 138 copies/mL. A negative result does not preclude SARS-Cov-2 infection and should not be used as the sole basis for treatment or other patient management decisions. A negative result may occur with  improper specimen collection/handling, submission of specimen other than nasopharyngeal swab, presence of viral mutation(s) within the areas targeted by this assay, and inadequate number of viral copies(<138 copies/mL). A negative result must be combined with clinical observations, patient history, and epidemiological information. The expected result is Negative.  Fact Sheet for Patients:  EntrepreneurPulse.com.au  Fact Sheet for Healthcare Providers:  IncredibleEmployment.be  This test is no t yet approved or cleared by the [place] and  has been authorized for detection and/or diagnosis of SARS-CoV-2 by FDA under  an Emergency Use Authorization (EUA). This EUA will remain  in effect (meaning this test can be used) for the duration of the COVID-19 declaration under Section 564(b)(1) of the Act, 21 U.S.C.section 360bbb-3(b)(1), unless the authorization is terminated  or revoked sooner.       Influenza A by PCR NEGATIVE NEGATIVE Final   Influenza B by PCR NEGATIVE NEGATIVE Final    Comment: (NOTE) The Xpert Xpress SARS-CoV-2/FLU/RSV plus assay is intended as an aid in the diagnosis of influenza from Nasopharyngeal swab specimens and should not be used as a sole basis for treatment. Nasal washings and aspirates are unacceptable for Xpert Xpress SARS-CoV-2/FLU/RSV testing.  Fact Sheet for Patients: EntrepreneurPulse.com.au  Fact Sheet for Healthcare Providers: IncredibleEmployment.be  This test is not yet approved or cleared by the [place] FDA and has been authorized for detection and/or diagnosis of SARS-CoV-2 by FDA under an Emergency Use Authorization (EUA). This EUA will remain in effect (meaning this test can be used) for the duration of the COVID-19 declaration under Section 564(b)(1) of the Act, 21 U.S.C. section 360bbb-3(b)(1), unless the authorization is terminated or revoked.  Performed at [facility], [place] [address], [place], [place] [address]   Culture, blood (routine x 2)     Status: None (Preliminary result)   Collection Time: 07/08/21  1:25 PM   Specimen: BLOOD  Result Value Ref Range Status   Specimen Description BLOOD BLOOD RIGHT ARM  Final   Special Requests   Final    BOTTLES DRAWN AEROBIC AND ANAEROBIC Blood Culture results may not be optimal due to an inadequate volume of blood received in culture bottles   Culture   Final    NO GROWTH 3 DAYS Performed at [facility], [place] [address], [place], [place] [address]    Report Status PENDING  Incomplete  Culture, blood (routine x 2)     Status: None (Preliminary  result)   Collection Time: 07/08/21  1:25 PM   Specimen: BLOOD  Result Value Ref Range Status   Specimen Description BLOOD SITE NOT SPECIFIED  Final   Special Requests   Final    BOTTLES DRAWN AEROBIC AND ANAEROBIC Blood Culture adequate volume   Culture   Final    NO GROWTH 3 DAYS Performed at [facility], [address] [place], [place] [address]    Report Status PENDING  Incomplete  Urine Culture     Status: Abnormal (Preliminary result)   Collection Time: 07/09/21  1:37 PM   Specimen: Urine, Suprapubic  Result Value Ref Range Status   Specimen Description URINE, SUPRAPUBIC  Final   Special Requests   Final    NONE Performed at [facility], [place] [address] [place], [place] [address]    Culture (A)  Final    50,000 COLONIES/mL ENTEROCOCCUS FAECALIS 10,000 COLONIES/mL KLEBSIELLA PNEUMONIAE 1,000 COLONIES/mL ESCHERICHIA COLI 1,000 COLONIES/mL PSEUDOMONAS AERUGINOSA VANCOMYCIN RESISTANT ENTEROCOCCUS ISOLATED    Report Status PENDING  Incomplete   Organism ID, Bacteria ENTEROCOCCUS FAECALIS (A)  Final   Organism ID, Bacteria KLEBSIELLA PNEUMONIAE (A)  Final      Susceptibility   Enterococcus faecalis - MIC*    AMPICILLIN <=2 SENSITIVE Sensitive     NITROFURANTOIN <=16 SENSITIVE Sensitive     VANCOMYCIN >=32 RESISTANT Resistant  LINEZOLID 1 SENSITIVE Sensitive     * 50,000 COLONIES/mL ENTEROCOCCUS FAECALIS   Klebsiella pneumoniae - MIC*    AMPICILLIN >=32 RESISTANT Resistant     CEFAZOLIN <=4 SENSITIVE Sensitive     CEFEPIME <=0.12 SENSITIVE Sensitive     CEFTRIAXONE <=0.25 SENSITIVE Sensitive     CIPROFLOXACIN <=0.25 SENSITIVE Sensitive     GENTAMICIN <=1 SENSITIVE Sensitive     IMIPENEM <=0.25 SENSITIVE Sensitive     NITROFURANTOIN 32 SENSITIVE Sensitive     TRIMETH/SULFA <=20 SENSITIVE Sensitive     AMPICILLIN/SULBACTAM 16 INTERMEDIATE Intermediate     PIP/TAZO 8 SENSITIVE Sensitive     * 10,000 COLONIES/mL KLEBSIELLA PNEUMONIAE      Labs:  CBC: Recent Labs  Lab 07/08/21 1325 07/09/21 0428 07/10/21 0144 07/11/21 0310 07/12/21 0230  WBC 11.5* 9.6 11.4* 10.9* 13.7*  NEUTROABS 5.1  --   --  5.2  --   HGB 9.9* 9.9* 10.4* 9.4* 8.8*  HCT 31.8* 32.8* 33.3* 30.5* 28.6*  MCV 91.9 92.4 90.2 91.6 92.0  PLT 413* 385 374 345 341   BMP &GFR Recent Labs  Lab 07/08/21 1325 07/09/21 0428 07/10/21 0144 07/11/21 0310 07/12/21 0230  NA 142 141 140 136 138  K 4.1 4.4 3.8 3.6 3.7  CL 112* 112* 110 109 109  CO2 24 23 21* 20* 20*  GLUCOSE 91 120* 171* 241* 209*  BUN 23 21 23 19 18   CREATININE 0.95 0.96 1.15* 0.93 1.11*  CALCIUM 11.0* 10.1 10.3 9.8 9.7  MG  --   --   --  1.5* 1.5*  PHOS  --   --   --  2.8 2.7   Estimated Creatinine Clearance: 47.3 mL/min (A) (by C-G formula based on SCr of 1.11 mg/dL (H)). Liver & Pancreas: Recent Labs  Lab 07/11/21 0310 07/12/21 0230  ALBUMIN 3.1* 3.0*   No results for input(s): LIPASE, AMYLASE in the last 168 hours. No results for input(s): AMMONIA in the last 168 hours. Diabetic: No results for input(s): HGBA1C in the last 72 hours. Recent Labs  Lab 07/11/21 0842 07/11/21 1134 07/11/21 1622 07/11/21 2033 07/12/21 0742  GLUCAP 156* 150* 158* 165* 171*   Cardiac Enzymes: No results for input(s): CKTOTAL, CKMB, CKMBINDEX, TROPONINI in the last 168 hours. No results for input(s): PROBNP in the last 8760 hours. Coagulation Profile: No results for input(s): INR, PROTIME in the last 168 hours. Thyroid Function Tests: No results for input(s): TSH, T4TOTAL, FREET4, T3FREE, THYROIDAB in the last 72 hours. Lipid Profile: No results for input(s): CHOL, HDL, LDLCALC, TRIG, CHOLHDL, LDLDIRECT in the last 72 hours. Anemia Panel: No results for input(s): VITAMINB12, FOLATE, FERRITIN, TIBC, IRON, RETICCTPCT in the last 72 hours. Urine analysis:    Component Value Date/Time   COLORURINE YELLOW 07/09/2021 1317   APPEARANCEUR CLOUDY (A) 07/09/2021 1317   LABSPEC 1.029  07/09/2021 1317   PHURINE 5.0 07/09/2021 1317   GLUCOSEU NEGATIVE 07/09/2021 1317   HGBUR MODERATE (A) 07/09/2021 1317   BILIRUBINUR NEGATIVE 07/09/2021 1317   KETONESUR NEGATIVE 07/09/2021 1317   PROTEINUR 30 (A) 07/09/2021 1317   UROBILINOGEN 0.2 12/20/2014 1851   NITRITE NEGATIVE 07/09/2021 1317   LEUKOCYTESUR LARGE (A) 07/09/2021 1317   Sepsis Labs: Invalid input(s): PROCALCITONIN, LACTICIDVEN   Time coordinating discharge: 45 minutes  SIGNED:  Mercy Riding, MD  [facility] 07/12/2021, 10:53 AM

## 2021-07-12 NOTE — TOC Transition Note (Signed)
Transition of Care [facility]) - CM/SW Discharge Note   Patient Details  Name: KHARLIE BRING MRN: [id number] Date of Birth: 12-06-1960  Transition of Care [facility]) CM/SW Contact:  Milinda Antis, [place] Phone Number: 07/12/2021, 12:26 PM   Clinical Narrative:    Patient will DC to: [facility] Anticipated DC date:  07/12/2021 Family notified: Yes; Hughes,Denise (Relative)  [phone]  Transport by: Corey Harold   Per MD patient ready for DC to SNF. RN to call report prior to discharge [phone] room 502A. RN, patient, patient's family, and facility notified of DC. Discharge Summary sent to facility. DC packet on chart. Ambulance transport will be requested for patient.   CSW will sign off for now as social work intervention is no longer needed. Please consult [place] again if new needs arise.     Final next level of care: Skilled Nursing Facility Barriers to Discharge: No Barriers Identified   Patient Goals and CMS Choice        Discharge Placement                Patient to be transferred to facility by: [place] Name of family member notified: Hughes,Denise (Relative)   [phone] Patient and family notified of of transfer: 07/12/21  Discharge Plan and Services                                     Social Determinants of Health (SDOH) Interventions     Readmission Risk Interventions No flowsheet data found.

## 2021-07-12 NOTE — TOC Progression Note (Addendum)
Transition of Care [facility]) - Initial/Assessment Note    Patient Details  Name: ANITHA KREISER MRN: [id number] Date of Birth: 1961/01/24  [facility] CM/SW Contact:    Milinda Antis, [place] Phone Number: 07/12/2021, 9:10 AM  Clinical Narrative:                 CSW attempted to contact someone in admissions at [facility] to inform them that the patient is ready to return.  There was no answer.  CSW left a secured VM requesting a returned call.    09:59-  CSW received a returned call from [place] with admissions at [facility].  The patient can return today.  MD and RN notified.   Pending D/C summary   Patient Goals and CMS Choice        Expected Discharge Plan and Services                                                Prior Living Arrangements/Services                       Activities of Daily Living Home Assistive Devices/Equipment: None ADL Screening (condition at time of admission) Patient's cognitive ability adequate to safely complete daily activities?: Yes Is the patient deaf or have difficulty hearing?: No Does the patient have difficulty seeing, even when wearing glasses/contacts?: No Does the patient have difficulty concentrating, remembering, or making decisions?: No Patient able to express need for assistance with ADLs?: Yes Does the patient have difficulty dressing or bathing?: Yes Independently performs ADLs?: No Communication: Independent Dressing (OT): Dependent Is this a change from baseline?: Pre-admission baseline Grooming: Dependent Is this a change from baseline?: Pre-admission baseline Feeding: Needs assistance Is this a change from baseline?: Pre-admission baseline Bathing: Dependent Is this a change from baseline?: Pre-admission baseline Toileting: Dependent Is this a change from baseline?: Pre-admission baseline In/Out Bed: Dependent Is this a change from baseline?: Pre-admission baseline Walks in Home:  Dependent Is this a change from baseline?: Pre-admission baseline Does the patient have difficulty walking or climbing stairs?: Yes Weakness of Legs: Both Weakness of Arms/Hands: Both  Permission Sought/Granted                  Emotional Assessment              Admission diagnosis:  Pyelonephritis [N12] Lower abdominal pain [R10.30] Patient Active Problem List   Diagnosis Date Noted   Pyelonephritis 07/08/2021   Bilateral hydronephrosis 09/13/2017   Acute lower UTI 09/13/2017   UTI due to extended-spectrum beta lactamase (ESBL) producing Escherichia coli 09/12/2017   Renal calculus 08/15/2016   Kidney stone 63/78/5885   Acute metabolic encephalopathy 02/77/4128   Essential hypertension 07/19/2016   Hydronephrosis    Pelvic abscess in female 03/17/2016   Septic shock ([place]) 03/08/2016   Neuropathic pain 01/11/2016   Left nephrolithiasis 12/22/2015   Acute pyelonephritis    Pressure ulcer    Controlled diabetes mellitus type 2 with complications ([place])    AKI (acute kidney injury) ([place]) 11/11/2015   Neurogenic bladder 11/11/2015   Physical deconditioning 11/11/2015   Chronic indwelling Foley catheter 09/30/2015   Dyslipidemia associated with type 2 diabetes mellitus ([place]) 08/28/2015   GERD without esophagitis 08/28/2015   Anemia 07/21/2015   Malnutrition of moderate degree 07/21/2015  DVT, lower extremity ([place]) 12/28/2014   Multiple sclerosis diagnosed 2002-not on Therapy any longer 06/26/2012   PCP:  Pcp, No Pharmacy:   [facility], [address] [place] [address] Phone: [phone] Fax: [phone]     Social Determinants of Health (SDOH) Interventions    Readmission Risk Interventions No flowsheet data found.

## 2021-07-12 NOTE — Plan of Care (Signed)
°  Problem: Urinary Elimination: Goal: Signs and symptoms of infection will decrease Outcome: Progressing   Problem: Education: Goal: Knowledge of General Education information will improve Description: Including pain rating scale, medication(s)/side effects and non-pharmacologic comfort measures Outcome: Progressing   Problem: Nutrition: Goal: Adequate nutrition will be maintained Outcome: Progressing   Problem: Elimination: Goal: Will not experience complications related to urinary retention Outcome: Progressing

## 2021-07-12 NOTE — Progress Notes (Signed)
Attempted to call report to [facility] x3 times. Not successful, kept getting hung up on once call transferred.

## 2021-07-13 LAB — CULTURE, BLOOD (ROUTINE X 2)
Culture: NO GROWTH
Culture: NO GROWTH
Special Requests: ADEQUATE

## 2021-07-14 LAB — URINE CULTURE: Culture: 50000 — AB

## 2022-10-04 NOTE — Progress Notes (Deleted)
Name: [name]  MRN/ DOB: [id number], [birth year]   Age/ Sex: 62 y.o., female    PCP: Pcp, No   Reason for Endocrinology Evaluation: Type 2 Diabetes Mellitus     Date of Initial Endocrinology Visit: 10/05/2022     PATIENT IDENTIFIER: [name] is a 62 y.o. female with a past medical history of HTN, GERD, dyslipidemia, MS with paraplegia and DM. The patient presented for initial endocrinology clinic visit on 10/05/2022 for consultative assistance with her diabetes management.    HPI: [name] was    Diagnosed with DM in 2012 Prior Medications tried/Intolerance: *** Currently checking blood sugars *** x / day,  before breakfast and ***.  Hypoglycemia episodes : ***               Symptoms: ***                 Frequency: ***/  Hemoglobin A1c has ranged from 5.1% in 2017, peaking at 7.4% in 2019. Patient required assistance for hypoglycemia:  Patient has required hospitalization within the last 1 year from hyper or hypoglycemia:   In terms of diet, the patient ***   Patient has MS with paraplegia, and suprapubic catheter due to neurogenic bladder, she does have recurrent UTI and pyelonephritis  HOME DIABETES REGIMEN: Metformin 1000 mg twice daily Glipizide 5 mg twice daily Lantus 10 units daily  Statin: Yes ACE-I/ARB: no    METER DOWNLOAD SUMMARY: Date range evaluated: *** Fingerstick Blood Glucose Tests = *** Average Number Tests/Day = *** Overall Mean FS Glucose = *** Standard Deviation = ***  BG Ranges: Low = *** High = ***   Hypoglycemic Events/30 Days: BG < 50 = *** Episodes of symptomatic severe hypoglycemia = ***   DIABETIC COMPLICATIONS: Microvascular complications:  *** Denies: CKD Last eye exam: Completed   Macrovascular complications:   Denies: CAD, PVD, CVA   PAST HISTORY: Past Medical History:  Past Medical History:  Diagnosis Date   Acute parametritis and pelvic cellulitis    Acute renal failure superimposed on stage 3  chronic kidney disease (HCC) 07/21/2015   Anemia    chronic    Contracture, right hand    Diabetes mellitus without complication (HCC)    Diabetic neuropathy (HCC)    DVT (deep venous thrombosis) (HCC)    DVT (deep venous thrombosis) (HCC)    GERD (gastroesophageal reflux disease)    History of kidney stones    Hx of sepsis    Hyperlipidemia    Hypertension    Hypokalemia    Hypomagnesemia    Left nephrolithiasis    Leukocytosis    Lymph edema    chronic    Malnutrition of moderate degree 07/21/2015   Multiple sclerosis diagnosed 2002-not on Therapy any longer 06/26/2012   Muscle weakness (generalized)    Neuromuscular disorder (HCC)    Neuromuscular dysfunction of bladder    Polyneuropathy    Presence of indwelling urinary catheter    Protein calorie malnutrition (HCC)    Stage 4 decubitus ulcer (HCC) 07/05/2015   Stroke (HCC)    UTI (lower urinary tract infection)    Past Surgical History:  Past Surgical History:  Procedure Laterality Date   COLONOSCOPY WITH PROPOFOL N/A 07/25/2016   Procedure: COLONOSCOPY WITH PROPOFOL;  Surgeon: Carman Ching, MD;  Location: [facility];  Service: Endoscopy;  Laterality: N/A;   CYSTOSCOPY W/ RETROGRADES Bilateral 09/18/2017   Procedure: CYSTOSCOPY WITH RETROGRADE PYELOGRAM;  Surgeon: Marcine Matar, MD;  Location:  [facility];  Service: Urology;  Laterality: Bilateral;   CYSTOSCOPY/URETEROSCOPY/HOLMIUM LASER/STENT PLACEMENT Left 09/18/2017   Procedure: CYSTOSCOPY/LEFTURETEROSCOPY/URETERAL STENT PLACEMENT STONE EXTRACTION;  Surgeon: Marcine Matar, MD;  Location: [facility];  Service: Urology;  Laterality: Left;   ESOPHAGOGASTRODUODENOSCOPY Left 01/02/2015   Procedure: ESOPHAGOGASTRODUODENOSCOPY (EGD);  Surgeon: Willis Modena, MD;  Location: [facility];  Service: Endoscopy;  Laterality: Left;   ESOPHAGOGASTRODUODENOSCOPY (EGD) WITH PROPOFOL N/A 07/25/2016   Procedure: ESOPHAGOGASTRODUODENOSCOPY (EGD) WITH PROPOFOL;  Surgeon: Carman Ching, MD;   Location: [facility];  Service: Endoscopy;  Laterality: N/A;   HERNIA MESH REMOVAL     HOLMIUM LASER APPLICATION Left 08/18/2016   Procedure: HOLMIUM LASER APPLICATION;  Surgeon: Marcine Matar, MD;  Location: [facility];  Service: Urology;  Laterality: Left;   IR CATHETER TUBE CHANGE  10/06/2016   IR CATHETER TUBE CHANGE  11/17/2016   IR CATHETER TUBE CHANGE  02/10/2017   IR GENERIC HISTORICAL  02/23/2016   IR NEPHROSTOMY EXCHANGE LEFT 02/23/2016 Simonne Come, MD [facility]   IR GENERIC HISTORICAL  04/19/2016   IR NEPHROSTOMY EXCHANGE LEFT 04/19/2016 Oley Balm, MD [facility]   IR GENERIC HISTORICAL  05/13/2016   IR NEPHROSTOMY EXCHANGE LEFT 05/13/2016 Gilmer Mor, DO [facility]   IR GENERIC HISTORICAL  05/13/2016   IR NEPHROSTOMY PLACEMENT RIGHT 05/13/2016 Gilmer Mor, DO [facility]   IR GENERIC HISTORICAL  07/07/2016   IR NEPHROSTOMY EXCHANGE RIGHT 07/07/2016 Malachy Moan, MD [facility]   IR GENERIC HISTORICAL  07/07/2016   IR NEPHROSTOMY EXCHANGE LEFT 07/07/2016 Malachy Moan, MD [facility]   IR NEPHROSTOGRAM LEFT THRU EXISTING ACCESS  12/28/2016   IR NEPHROSTOGRAM RIGHT THRU EXISTING ACCESS  12/28/2016   IR NEPHROSTOMY EXCHANGE LEFT  10/06/2016   IR NEPHROSTOMY EXCHANGE LEFT  11/17/2016   IR NEPHROSTOMY EXCHANGE RIGHT  10/06/2016   IR NEPHROSTOMY EXCHANGE RIGHT  11/17/2016   NEPHROLITHOTOMY Left 08/18/2016   Procedure: NEPHROLITHOTOMY LEFT PERCUTANEOUS;  Surgeon: Marcine Matar, MD;  Location: [facility];  Service: Urology;  Laterality: Left;   NEPHROSTOMY TUBE PLACEMENT ([facility] HX)     TRANSURETHRAL RESECTION OF BLADDER TUMOR N/A 03/16/2016   Procedure: CYSTOSCOPY BLADDER BIOPSY;  Surgeon: Marcine Matar, MD;  Location: [facility];  Service: Urology;  Laterality: N/A;   TUBAL LIGATION     tubes tided      Social History:  reports that she quit smoking about 7 years ago. Her smoking use included cigarettes. She has a 20.00 pack-year smoking history. She has never used smokeless  tobacco. She reports that she does not currently use alcohol after a past usage of about 5.0 standard drinks of alcohol per week. She reports that she does not use drugs. Family History:  Family History  Problem Relation Age of Onset   Diabetes Mother    Alzheimer's disease Mother    Diabetes Father    Diabetes Sister    Scoliosis Sister      HOME MEDICATIONS: Allergies as of 10/05/2022   No Known Allergies      Medication List        Accurate as of October 05, 2022  7:05 AM. If you have any questions, ask your nurse or doctor.          acetaminophen 325 MG tablet Commonly known as: TYLENOL Take 2 tablets (650 mg total) by mouth every 6 (six) hours as needed for fever. What changed: reasons to take this   acetic acid 0.25 % irrigation Irrigate with as directed See admin instructions. 60 ml's  two times a day as needed for catheter sediment   albuterol (2.5 MG/3ML) 0.083% nebulizer solution Commonly known as: PROVENTIL Take 3 mLs (2.5 mg total) by nebulization every 6 (six) hours as needed for wheezing or shortness of breath.   atorvastatin 20 MG tablet Commonly known as: LIPITOR Take 20 mg by mouth at bedtime.   doxycycline 100 MG tablet Commonly known as: VIBRA-TABS Take 100 mg by mouth daily.   famotidine 20 MG tablet Commonly known as: Pepcid Take 1 tablet (20 mg total) by mouth 2 (two) times daily.   ferrous sulfate 325 (65 FE) MG EC tablet Take 325 mg by mouth 2 (two) times daily.   gabapentin 100 MG capsule Commonly known as: NEURONTIN Take 1 capsule (100 mg total) by mouth at bedtime.   glipiZIDE 5 MG tablet Commonly known as: GLUCOTROL Take 5 mg by mouth in the morning and at bedtime.   HYDROcodone-acetaminophen 5-325 MG tablet Commonly known as: NORCO/VICODIN Take 1 tablet by mouth every 6 (six) hours as needed for moderate pain.   insulin glargine 100 UNIT/ML injection Commonly known as: LANTUS Inject 10 Units into the skin in the morning.    latanoprost 0.005 % ophthalmic solution Commonly known as: XALATAN Place 1 drop into both eyes at bedtime.   metFORMIN 1000 MG tablet Commonly known as: GLUCOPHAGE Take 1,000 mg by mouth in the morning and at bedtime.   mirtazapine 7.5 MG tablet Commonly known as: REMERON Take 7.5 mg by mouth at bedtime.   Myrbetriq 25 MG Tb24 tablet Generic drug: mirabegron ER Take 25 mg by mouth daily.   neomycin-bacitracin-polymyxin Oint Commonly known as: NEOSPORIN Apply 1 application topically 2 (two) times daily. To neck area and upper chest   NON FORMULARY Take 360 mLs by mouth See admin instructions. Sugar-free house supplement: Drink 360 ml's by mouth once a day   nystatin cream Commonly known as: MYCOSTATIN Apply 1 application topically See admin instructions. Apply to perineal area 2 times a day as needed for candidiasis   ondansetron 4 MG tablet Commonly known as: ZOFRAN Take 4 mg by mouth every 8 (eight) hours as needed for nausea or vomiting.   oxybutynin 5 MG tablet Commonly known as: DITROPAN Take 5 mg by mouth 2 (two) times daily as needed for bladder spasms.   polyethylene glycol 17 g packet Commonly known as: MIRALAX / GLYCOLAX Take 17 g by mouth 2 (two) times daily.   potassium chloride 10 MEQ tablet Commonly known as: KLOR-CON M Take 10 mEq by mouth daily.   sennosides-docusate sodium 8.6-50 MG tablet Commonly known as: SENOKOT-S Take 1 tablet by mouth at bedtime.   Vitamin D3 1.25 MG (50000 UT) Caps Take 50,000 Units by mouth every Tuesday.         ALLERGIES: No Known Allergies   REVIEW OF SYSTEMS: A comprehensive ROS was conducted with the patient and is negative except as per HPI and below:  ROS    OBJECTIVE:   VITAL SIGNS: There were no vitals taken for this visit.   PHYSICAL EXAM:  General: Pt appears well and is in NAD  Neck: General: Supple without adenopathy or carotid bruits. Thyroid: Thyroid size normal.  No goiter or nodules  appreciated.   Lungs: Clear with good BS bilat with no rales, rhonchi, or wheezes  Heart: RRR   Abdomen:  soft, nontender  Extremities:  Lower extremities - No pretibial edema.   Neuro: MS is good with appropriate affect, pt is alert and  Ox3    DM foot exam:    DATA REVIEWED:  Lab Results  Component Value Date   HGBA1C 6.4 (H) 07/08/2021   HGBA1C 7.4 (H) 09/14/2017   HGBA1C 5.3 07/15/2016   Lab Results  Component Value Date   LDLCALC 101 09/27/2016   CREATININE 1.11 (H) 07/12/2021   No results found for: "MICRALBCREAT"  Lab Results  Component Value Date   CHOL 179 09/27/2016   HDL 56 09/27/2016   LDLCALC 101 09/27/2016   TRIG 112 09/27/2016        ASSESSMENT / PLAN / RECOMMENDATIONS:   1) Type 2 Diabetes Mellitus, ***controlled, With*** complications - Most recent A1c of *** %. Goal A1c < 7.0 %.    Plan: GENERAL: Not a candidate for SGLT2 inhibitors due to recurrent UTI and pyelonephritis  MEDICATIONS: ***  EDUCATION / INSTRUCTIONS: BG monitoring instructions: Patient is instructed to check her blood sugars *** times a day, ***. Call [facility] if: BG persistently < 70  I reviewed the Rule of 15 for the treatment of hypoglycemia in detail with the patient. Literature supplied.   2) Diabetic complications:  Eye: Does *** have known diabetic retinopathy.  Neuro/ Feet: Does *** have known diabetic peripheral neuropathy. Renal: Patient does *** have known baseline CKD. She is *** on an ACEI/ARB at present.  3) Lipids: Patient is *** on a statin.    4) Hypertension: ***  at goal of < 140/90 mmHg.       Signed electronically by: Lyndle Herrlich, MD  [facility]  [facility] [address] [place], [place] [address] Phone: [phone] FAX: [phone]   CC: Pcp, No No address on file Phone: None  Fax: None    Return to Endocrinology clinic as below: Future Appointments  Date Time  Provider Department Center  10/05/2022  9:10 AM Travion Ke, Konrad Dolores, MD [id number] None

## 2022-10-05 ENCOUNTER — Ambulatory Visit: Payer: Medicare Other | Admitting: Internal Medicine
